# Patient Record
Sex: Male | Born: 1960
Health system: Southern US, Community
[De-identification: ages and names within clinical notes are randomized; demographics above are authoritative.]

## PROBLEM LIST (undated history)

## (undated) DIAGNOSIS — E785 Hyperlipidemia, unspecified: Secondary | ICD-10-CM

## (undated) DIAGNOSIS — C61 Malignant neoplasm of prostate: Secondary | ICD-10-CM

## (undated) DIAGNOSIS — I428 Other cardiomyopathies: Secondary | ICD-10-CM

## (undated) DIAGNOSIS — F0390 Unspecified dementia without behavioral disturbance: Secondary | ICD-10-CM

## (undated) DIAGNOSIS — I4891 Unspecified atrial fibrillation: Secondary | ICD-10-CM

## (undated) DIAGNOSIS — I219 Acute myocardial infarction, unspecified: Secondary | ICD-10-CM

## (undated) DIAGNOSIS — Z8249 Family history of ischemic heart disease and other diseases of the circulatory system: Secondary | ICD-10-CM

## (undated) DIAGNOSIS — R0602 Shortness of breath: Secondary | ICD-10-CM

## (undated) DIAGNOSIS — I447 Left bundle-branch block, unspecified: Secondary | ICD-10-CM

## (undated) DIAGNOSIS — N186 End stage renal disease: Secondary | ICD-10-CM

## (undated) DIAGNOSIS — G4733 Obstructive sleep apnea (adult) (pediatric): Secondary | ICD-10-CM

## (undated) DIAGNOSIS — M1990 Unspecified osteoarthritis, unspecified site: Secondary | ICD-10-CM

## (undated) DIAGNOSIS — I493 Ventricular premature depolarization: Secondary | ICD-10-CM

## (undated) DIAGNOSIS — F32A Depression, unspecified: Secondary | ICD-10-CM

## (undated) DIAGNOSIS — I509 Heart failure, unspecified: Secondary | ICD-10-CM

## (undated) DIAGNOSIS — E669 Obesity, unspecified: Secondary | ICD-10-CM

## (undated) DIAGNOSIS — I10 Essential (primary) hypertension: Secondary | ICD-10-CM

## (undated) DIAGNOSIS — I639 Cerebral infarction, unspecified: Secondary | ICD-10-CM

## (undated) DIAGNOSIS — N189 Chronic kidney disease, unspecified: Secondary | ICD-10-CM

## (undated) DIAGNOSIS — K219 Gastro-esophageal reflux disease without esophagitis: Secondary | ICD-10-CM

## (undated) DIAGNOSIS — I519 Heart disease, unspecified: Secondary | ICD-10-CM

## (undated) DIAGNOSIS — F419 Anxiety disorder, unspecified: Secondary | ICD-10-CM

## (undated) HISTORY — DX: Cerebral infarction, unspecified: I63.9

## (undated) HISTORY — DX: Family history of ischemic heart disease and other diseases of the circulatory system: Z82.49

## (undated) HISTORY — DX: Unspecified atrial fibrillation: I48.91

## (undated) HISTORY — PX: FOOT SURGERY: SHX648

## (undated) HISTORY — DX: Essential (primary) hypertension: I10

## (undated) HISTORY — PX: PROSTATE BIOPSY: SHX241

## (undated) HISTORY — DX: Heart failure, unspecified: I50.9

## (undated) HISTORY — DX: Morbid (severe) obesity due to excess calories: E66.01

## (undated) HISTORY — DX: Ventricular premature depolarization: I49.3

## (undated) HISTORY — DX: Shortness of breath: R06.02

## (undated) HISTORY — PX: PACEMAKER REMOVAL: SHX5066

## (undated) HISTORY — DX: Heart disease, unspecified: I51.9

## (undated) HISTORY — DX: Left bundle-branch block, unspecified: I44.7

## (undated) HISTORY — PX: LEFT VENTRICULAR ASSIST DEVICE: SHX2679

## (undated) HISTORY — PX: COLONOSCOPY W/ BIOPSIES AND POLYPECTOMY: SHX1376

## (undated) HISTORY — DX: Gastro-esophageal reflux disease without esophagitis: K21.9

## (undated) HISTORY — DX: Hyperlipidemia, unspecified: E78.5

## (undated) HISTORY — DX: Other cardiomyopathies: I42.8

## (undated) HISTORY — DX: Obstructive sleep apnea (adult) (pediatric): G47.33

---

## 1997-12-30 ENCOUNTER — Ambulatory Visit (HOSPITAL_COMMUNITY): Admission: RE | Admit: 1997-12-30 | Discharge: 1997-12-30 | Payer: Self-pay | Admitting: Cardiology

## 1999-05-15 ENCOUNTER — Inpatient Hospital Stay (HOSPITAL_COMMUNITY): Admission: EM | Admit: 1999-05-15 | Discharge: 1999-05-17 | Payer: Self-pay

## 1999-05-16 ENCOUNTER — Encounter: Payer: Self-pay | Admitting: Cardiology

## 1999-12-19 ENCOUNTER — Inpatient Hospital Stay (HOSPITAL_COMMUNITY): Admission: AD | Admit: 1999-12-19 | Discharge: 1999-12-21 | Payer: Self-pay | Admitting: Internal Medicine

## 1999-12-25 ENCOUNTER — Encounter: Admission: RE | Admit: 1999-12-25 | Discharge: 2000-03-24 | Payer: Self-pay | Admitting: Internal Medicine

## 2000-12-25 ENCOUNTER — Encounter: Admission: RE | Admit: 2000-12-25 | Discharge: 2001-03-25 | Payer: Self-pay | Admitting: Internal Medicine

## 2001-05-21 ENCOUNTER — Encounter: Admission: RE | Admit: 2001-05-21 | Discharge: 2001-08-19 | Payer: Self-pay | Admitting: Internal Medicine

## 2002-12-29 ENCOUNTER — Encounter: Admission: RE | Admit: 2002-12-29 | Discharge: 2003-03-29 | Payer: Self-pay | Admitting: Surgery

## 2002-12-29 ENCOUNTER — Encounter: Payer: Self-pay | Admitting: Surgery

## 2002-12-29 ENCOUNTER — Ambulatory Visit (HOSPITAL_COMMUNITY): Admission: RE | Admit: 2002-12-29 | Discharge: 2002-12-29 | Payer: Self-pay | Admitting: Surgery

## 2003-01-06 ENCOUNTER — Ambulatory Visit (HOSPITAL_COMMUNITY): Admission: RE | Admit: 2003-01-06 | Discharge: 2003-01-06 | Payer: Self-pay | Admitting: Surgery

## 2003-01-06 ENCOUNTER — Encounter: Payer: Self-pay | Admitting: Cardiology

## 2003-02-02 ENCOUNTER — Ambulatory Visit (HOSPITAL_BASED_OUTPATIENT_CLINIC_OR_DEPARTMENT_OTHER): Admission: RE | Admit: 2003-02-02 | Discharge: 2003-02-02 | Payer: Self-pay | Admitting: Surgery

## 2003-04-24 ENCOUNTER — Emergency Department (HOSPITAL_COMMUNITY): Admission: EM | Admit: 2003-04-24 | Discharge: 2003-04-25 | Payer: Self-pay | Admitting: Emergency Medicine

## 2003-07-15 ENCOUNTER — Emergency Department (HOSPITAL_COMMUNITY): Admission: EM | Admit: 2003-07-15 | Discharge: 2003-07-15 | Payer: Self-pay | Admitting: Emergency Medicine

## 2003-07-17 ENCOUNTER — Inpatient Hospital Stay (HOSPITAL_COMMUNITY): Admission: RE | Admit: 2003-07-17 | Discharge: 2003-07-18 | Payer: Self-pay | Admitting: Internal Medicine

## 2005-07-31 ENCOUNTER — Ambulatory Visit (HOSPITAL_BASED_OUTPATIENT_CLINIC_OR_DEPARTMENT_OTHER): Admission: RE | Admit: 2005-07-31 | Discharge: 2005-07-31 | Payer: Self-pay | Admitting: Cardiology

## 2005-08-05 ENCOUNTER — Ambulatory Visit: Payer: Self-pay | Admitting: Internal Medicine

## 2005-09-13 ENCOUNTER — Ambulatory Visit: Payer: Self-pay | Admitting: Internal Medicine

## 2005-11-05 ENCOUNTER — Emergency Department (HOSPITAL_COMMUNITY): Admission: EM | Admit: 2005-11-05 | Discharge: 2005-11-06 | Payer: Self-pay | Admitting: Emergency Medicine

## 2006-10-09 ENCOUNTER — Ambulatory Visit (HOSPITAL_COMMUNITY): Admission: RE | Admit: 2006-10-09 | Discharge: 2006-10-09 | Payer: Self-pay | Admitting: Surgery

## 2006-10-14 ENCOUNTER — Encounter: Admission: RE | Admit: 2006-10-14 | Discharge: 2007-01-12 | Payer: Self-pay | Admitting: Surgery

## 2006-10-17 ENCOUNTER — Ambulatory Visit: Payer: Self-pay | Admitting: Internal Medicine

## 2006-10-23 ENCOUNTER — Ambulatory Visit (HOSPITAL_COMMUNITY): Admission: RE | Admit: 2006-10-23 | Discharge: 2006-10-23 | Payer: Self-pay | Admitting: Surgery

## 2006-12-25 ENCOUNTER — Ambulatory Visit (HOSPITAL_COMMUNITY): Admission: RE | Admit: 2006-12-25 | Discharge: 2006-12-25 | Payer: Self-pay | Admitting: Surgery

## 2007-01-13 ENCOUNTER — Encounter: Admission: RE | Admit: 2007-01-13 | Discharge: 2007-04-13 | Payer: Self-pay | Admitting: Surgery

## 2007-01-27 ENCOUNTER — Ambulatory Visit (HOSPITAL_COMMUNITY): Admission: RE | Admit: 2007-01-27 | Discharge: 2007-01-28 | Payer: Self-pay | Admitting: Surgery

## 2007-01-27 HISTORY — PX: LAPAROSCOPIC GASTRIC BANDING: SHX1100

## 2007-10-17 DIAGNOSIS — E1142 Type 2 diabetes mellitus with diabetic polyneuropathy: Secondary | ICD-10-CM | POA: Insufficient documentation

## 2007-10-17 DIAGNOSIS — E1169 Type 2 diabetes mellitus with other specified complication: Secondary | ICD-10-CM | POA: Insufficient documentation

## 2007-10-17 DIAGNOSIS — I428 Other cardiomyopathies: Secondary | ICD-10-CM

## 2007-10-17 DIAGNOSIS — E669 Obesity, unspecified: Secondary | ICD-10-CM

## 2007-10-17 DIAGNOSIS — G4733 Obstructive sleep apnea (adult) (pediatric): Secondary | ICD-10-CM

## 2007-10-21 ENCOUNTER — Encounter: Payer: Self-pay | Admitting: Internal Medicine

## 2008-08-27 DIAGNOSIS — I639 Cerebral infarction, unspecified: Secondary | ICD-10-CM

## 2008-08-27 HISTORY — DX: Cerebral infarction, unspecified: I63.9

## 2008-09-06 ENCOUNTER — Ambulatory Visit: Payer: Self-pay | Admitting: *Deleted

## 2008-09-06 ENCOUNTER — Inpatient Hospital Stay (HOSPITAL_COMMUNITY): Admission: EM | Admit: 2008-09-06 | Discharge: 2008-09-08 | Payer: Self-pay | Admitting: Emergency Medicine

## 2008-09-06 ENCOUNTER — Encounter (INDEPENDENT_AMBULATORY_CARE_PROVIDER_SITE_OTHER): Payer: Self-pay | Admitting: *Deleted

## 2008-09-07 ENCOUNTER — Encounter (INDEPENDENT_AMBULATORY_CARE_PROVIDER_SITE_OTHER): Payer: Self-pay | Admitting: Neurology

## 2009-08-03 ENCOUNTER — Encounter: Payer: Self-pay | Admitting: Internal Medicine

## 2009-08-17 ENCOUNTER — Encounter: Payer: Self-pay | Admitting: Internal Medicine

## 2009-08-30 DIAGNOSIS — I4891 Unspecified atrial fibrillation: Secondary | ICD-10-CM | POA: Insufficient documentation

## 2009-08-30 DIAGNOSIS — I2589 Other forms of chronic ischemic heart disease: Secondary | ICD-10-CM | POA: Insufficient documentation

## 2009-08-30 DIAGNOSIS — E785 Hyperlipidemia, unspecified: Secondary | ICD-10-CM

## 2009-08-30 DIAGNOSIS — I10 Essential (primary) hypertension: Secondary | ICD-10-CM | POA: Insufficient documentation

## 2009-08-30 DIAGNOSIS — I4949 Other premature depolarization: Secondary | ICD-10-CM

## 2009-08-30 DIAGNOSIS — Z8679 Personal history of other diseases of the circulatory system: Secondary | ICD-10-CM

## 2009-08-30 DIAGNOSIS — Z8673 Personal history of transient ischemic attack (TIA), and cerebral infarction without residual deficits: Secondary | ICD-10-CM | POA: Insufficient documentation

## 2009-08-31 ENCOUNTER — Telehealth (INDEPENDENT_AMBULATORY_CARE_PROVIDER_SITE_OTHER): Payer: Self-pay | Admitting: *Deleted

## 2009-08-31 ENCOUNTER — Ambulatory Visit: Payer: Self-pay | Admitting: Internal Medicine

## 2009-09-01 ENCOUNTER — Telehealth: Payer: Self-pay | Admitting: Internal Medicine

## 2009-09-01 LAB — CONVERTED CEMR LAB
Basophils Relative: 1.2 % (ref 0.0–3.0)
Calcium: 8.7 mg/dL (ref 8.4–10.5)
Chloride: 103 meq/L (ref 96–112)
Creatinine, Ser: 1 mg/dL (ref 0.4–1.5)
Eosinophils Absolute: 0.3 10*3/uL (ref 0.0–0.7)
Eosinophils Relative: 6 % — ABNORMAL HIGH (ref 0.0–5.0)
HCT: 44.7 % (ref 39.0–52.0)
Hemoglobin: 14.6 g/dL (ref 13.0–17.0)
INR: 3.2 — ABNORMAL HIGH (ref 0.8–1.0)
Lymphs Abs: 1.1 10*3/uL (ref 0.7–4.0)
Neutro Abs: 2.5 10*3/uL (ref 1.4–7.7)
Platelets: 162 10*3/uL (ref 150.0–400.0)
WBC: 4.3 10*3/uL — ABNORMAL LOW (ref 4.5–10.5)

## 2009-09-05 ENCOUNTER — Ambulatory Visit: Payer: Self-pay | Admitting: Internal Medicine

## 2009-09-05 LAB — CONVERTED CEMR LAB: Prothrombin Time: 28.2 s — ABNORMAL HIGH (ref 9.1–11.7)

## 2009-09-06 ENCOUNTER — Ambulatory Visit: Payer: Self-pay | Admitting: Internal Medicine

## 2009-09-06 ENCOUNTER — Inpatient Hospital Stay (HOSPITAL_COMMUNITY): Admission: RE | Admit: 2009-09-06 | Discharge: 2009-09-07 | Payer: Self-pay | Admitting: Internal Medicine

## 2009-09-07 ENCOUNTER — Encounter: Payer: Self-pay | Admitting: Internal Medicine

## 2009-09-19 ENCOUNTER — Ambulatory Visit: Payer: Self-pay

## 2009-09-19 ENCOUNTER — Encounter: Payer: Self-pay | Admitting: Internal Medicine

## 2009-09-21 HISTORY — PX: CARDIAC PACEMAKER PLACEMENT: SHX583

## 2009-10-12 ENCOUNTER — Ambulatory Visit: Payer: Self-pay | Admitting: Internal Medicine

## 2009-10-24 ENCOUNTER — Telehealth (INDEPENDENT_AMBULATORY_CARE_PROVIDER_SITE_OTHER): Payer: Self-pay | Admitting: *Deleted

## 2009-11-07 ENCOUNTER — Emergency Department (HOSPITAL_COMMUNITY): Admission: EM | Admit: 2009-11-07 | Discharge: 2009-11-07 | Payer: Self-pay | Admitting: Emergency Medicine

## 2009-11-09 ENCOUNTER — Telehealth (INDEPENDENT_AMBULATORY_CARE_PROVIDER_SITE_OTHER): Payer: Self-pay | Admitting: *Deleted

## 2009-12-21 ENCOUNTER — Ambulatory Visit: Payer: Self-pay | Admitting: Internal Medicine

## 2009-12-21 DIAGNOSIS — I472 Ventricular tachycardia, unspecified: Secondary | ICD-10-CM | POA: Insufficient documentation

## 2010-03-17 ENCOUNTER — Ambulatory Visit: Payer: Self-pay | Admitting: Internal Medicine

## 2010-03-29 ENCOUNTER — Ambulatory Visit: Payer: Self-pay | Admitting: Cardiology

## 2010-04-05 ENCOUNTER — Ambulatory Visit: Payer: Self-pay | Admitting: Cardiology

## 2010-04-12 ENCOUNTER — Ambulatory Visit: Payer: Self-pay | Admitting: Cardiology

## 2010-04-19 ENCOUNTER — Ambulatory Visit: Payer: Self-pay | Admitting: Cardiology

## 2010-04-26 ENCOUNTER — Ambulatory Visit: Payer: Self-pay | Admitting: Cardiology

## 2010-05-03 ENCOUNTER — Ambulatory Visit: Payer: Self-pay | Admitting: Cardiology

## 2010-05-11 ENCOUNTER — Ambulatory Visit: Payer: Self-pay | Admitting: Cardiology

## 2010-06-01 ENCOUNTER — Ambulatory Visit: Payer: Self-pay | Admitting: Cardiology

## 2010-06-15 ENCOUNTER — Ambulatory Visit: Payer: Self-pay | Admitting: Internal Medicine

## 2010-06-15 ENCOUNTER — Ambulatory Visit: Payer: Self-pay | Admitting: Cardiology

## 2010-06-21 ENCOUNTER — Ambulatory Visit: Payer: Self-pay | Admitting: Cardiology

## 2010-06-28 ENCOUNTER — Ambulatory Visit: Payer: Self-pay | Admitting: Cardiology

## 2010-06-30 ENCOUNTER — Encounter (INDEPENDENT_AMBULATORY_CARE_PROVIDER_SITE_OTHER): Payer: Self-pay | Admitting: *Deleted

## 2010-07-05 ENCOUNTER — Ambulatory Visit: Payer: Self-pay | Admitting: Cardiology

## 2010-08-02 ENCOUNTER — Ambulatory Visit: Payer: Self-pay | Admitting: Cardiology

## 2010-09-26 NOTE — Letter (Signed)
Summary: Implantable Device Instructions  Press photographer, [place]  [id number] [place]   [place], [place] [address]   Phone: [phone]  Fax: [phone]      Implantable Device Instructions  You are scheduled for:    a BI-V ICD  on 09/06/09 with Dr. Rayann Heman  1.  Please arrive at the [facility] at 5:45amon the day of your procedure.  2.  Do not eat or drink after midnight the night before your procedure.  3.  Complete lab work on 08/31/09.   4.  Do NOT take these medications for the day of your procedure:  Furosemide  Take your last dose of Coumadin on--I will call you if you need to hold  5.  Plan for an overnight stay.  Bring your insurance cards and a list of your medications.  6.  Wash your chest and neck with antibacterial soap (any brand) the evening before and the morning of your procedure.  Rinse well.  7.  Education material received:     Bi-V ICD  *If you have ANY questions after you get home, please call the office [phone].  Leonia Reader  *Every attempt is made to prevent procedures from being rescheduled.  Due to the nauture of Electrophysiology, rescheduling can happen.  The physician is always aware and directs the staff when this occurs.

## 2010-09-26 NOTE — Cardiovascular Report (Signed)
Summary: Cardiac Cath Pre-Orders  Cardiac Cath Pre-Orders   Imported By: Jamelle Haring 09/29/2009 08:17:09  _____________________________________________________________________  External Attachment:    Type:   Image     Comment:   External Document

## 2010-09-26 NOTE — Progress Notes (Signed)
  Patient left FMLA papers, Forwarded to [facility] for Processing. [facility] Mesiemore  August 31, 2009 4:18 PM

## 2010-09-26 NOTE — Letter (Signed)
Summary: Remote Device Check  [place], [place]  [id number] [place]   [place], [place] [address]   Phone: [phone]  Fax: [phone]     June 30, 2010 MRN: [id number]   [name] [address] [place],   [address]   Dear [name],   Your remote transmission was recieved and reviewed by your physician.  All diagnostics were within normal limits for you.  _____Your next transmission is scheduled for:                       .  Please transmit at any time this day.  If you have a wireless device your transmission will be sent automatically.  __X____Your next office visit is scheduled for: January 2012 with Dr. Rayann Heman.                                 Sincerely,  Alma Friendly, LPN

## 2010-09-26 NOTE — Letter (Signed)
Summary: [facility] Medical Records  [facility] Medical [facility]   Imported By: Jamelle Haring 09/29/2009 08:49:02  _____________________________________________________________________  External Attachment:    Type:   Image     Comment:   External Document

## 2010-09-26 NOTE — Procedures (Signed)
Summary: wound check/jml   Current Medications (verified): 1)  Potassium Chloride Cr 10 Meq Cr-Caps (Potassium Chloride) .... Take One Tablet By Mouth Daily 2)  Furosemide 80 Mg Tabs (Furosemide) .... Take One-Two Tablets By Mouth Daily. As Needed 3)  Carvedilol 3.125 Mg Tabs (Carvedilol) .... Take One Tablet By Mouth Once Daily 4)  Warfarin Sodium 5 Mg Tabs (Warfarin Sodium) .... Use As Directed By Anticoagulation Clinic 5)  Ramipril 5 Mg Caps (Ramipril) .... Take One Capsule By Mouth Daily  Allergies (verified): No Known Drug Allergies   ICD Specifications Following MD:  Thompson Grayer, MD     Referring MD:  [place] ICD Vendor:  Medtronic     ICD Model Number:  [id number]     ICD Serial Number:  [id number] ICD DOI:  09/06/2009     ICD Implanting MD:  Thompson Grayer, MD  Lead 1:    Location: RA     DOI: 09/06/2009     Model #: [id number]     Serial #: [id number]     Status: active Lead 2:    Location: RV     DOI: 09/06/2009     Model #: [id number]     Serial #: [id number]     Status: active Lead 3:    Location: LV     DOI: 09/06/2009     Model #: [id number]     Serial #: [id number]     Status: active  Indications::  DCM, CHF   ICD Follow Up Remote Check?  No Battery Voltage:  3.21 V     Charge Time:  7.9 seconds     Underlying rhythm:  SB WITH FREQUENT PVC'S ICD Dependent:  No       ICD Device Measurements Atrium:  Amplitude: 5.8 mV, Impedance: 380 ohms, Threshold: 0.75 V at 0.4 msec Right Ventricle:  Amplitude: 11.1 mV, Impedance: 475 ohms, Threshold: 0.5 V at 0.4 msec Left Ventricle:  Impedance: 380 ohms, Threshold: 0.75 V at 0.4 msec Configuration: LV RING TO RV COIL Shock Impedance: 39/49 ohms   Episodes MS Episodes:  0     Shock:  0     ATP:  1     Nonsustained:  2     Atrial Pacing:  2.8%     Ventricular Pacing:  83.7%  Brady Parameters Mode DDD     Lower Rate Limit:  80     Upper Rate Limit 130 PAV 130     Sensed AV Delay:  100  Tachy Zones VF:  207     Next Cardiology Appt Due:   10/10/2009 Tech Comments:  Wound check appt.  Steri-strips removed.  Wound without redness or edema.  Pt with frequent ventricular ectopy and runs of an idioventricular rhythm that results in decreased CRT pacing.  Patient had only been taking Coreg 3.125mg  at bedtime, per Dr Rayann Heman, increase to 6.25mg  two times a day.  Pt will do this gradually over the next week and monitor for recurrence of orthostatic intolerance or near syncope.  Pt had 1 episode of VT that was terminated with ATP during charging in the VF zone.  Will address need for a VT zone at next office visit.  Pt also had 2 episodes of NSVT.  Pt unaware of these episodes. Base rate increased to 80bpm today per Dr Rayann Heman to try and suppress ectopy.  ROV 3 weeks Dr Rayann Heman.  Rodena Piety with Dr Martinique notified of above.  Pt has an appt with Dr Martinique in the  next week per his report. Chanetta Marshall RN BSN  September 19, 2009 3:53 PM

## 2010-09-26 NOTE — Letter (Signed)
Summary: [facility]   Imported By: Jamelle Haring 08/31/2009 09:04:12  _____________________________________________________________________  External Attachment:    Type:   Image     Comment:   External Document

## 2010-09-26 NOTE — Progress Notes (Signed)
Summary: Records request from [facility]  Request for records received from [facility]. Request forwarded to [employer]. Dena Chavis  November 09, 2009 8:20 AM

## 2010-09-26 NOTE — Cardiovascular Report (Signed)
Summary: Office Visit Remote   Office Visit Remote   Imported By: Sallee Provencal 07/04/2010 15:32:22  _____________________________________________________________________  External Attachment:    Type:   Image     Comment:   External Document

## 2010-09-26 NOTE — Cardiovascular Report (Signed)
Summary: Office Visit  Office Visit   Imported By: Marilynne Drivers 02/28/2010 13:22:25  _____________________________________________________________________  External Attachment:    Type:   Image     Comment:   External Document

## 2010-09-26 NOTE — Assessment & Plan Note (Signed)
Summary: pc2/jml   Visit Type:  Follow-up Referring Provider:  Peter Martinique, MD Primary Provider:  Rolm Bookbinder, MD   History of Present Illness: The patient presents today for electrophysiology followup. Since last being seen in our office, he received ICD shocks for VT/VF on 11/07/09.  He then went to [facility] and received an LVAD.  He was also placed on Amiodarone.  He has done very well since that time.  He reports that his energy has significantly improved.  The patient denies symptoms of chest pain, orthopnea, PND, lower extremity edema, dizziness, presyncope, syncope, or neurologic sequela. The patient is tolerating medications without difficulties and is otherwise without complaint today.   Current Medications (verified): 1)  Carvedilol 3.125 Mg Tabs (Carvedilol) .Marland Kitchen.. 1 Tab Two Times A Day 2)  Warfarin Sodium 5 Mg Tabs (Warfarin Sodium) .... Use As Directed By Anticoagulation Clinic 3)  Lisinopril 2.5 Mg Tabs (Lisinopril) .... Take One Tablet By Mouth Daily 4)  Amiodarone Hcl 200 Mg Tabs (Amiodarone Hcl) .... Take One Tablet By Mouth Daily 5)  Prilosec 20 Mg Cpdr (Omeprazole) .... Once Daily  Allergies (verified): No Known Drug Allergies  Past History:  Past Medical History: ATRIAL FIBRILLATION (ICD-427.31) Nonischemic CM (EF 15-20%) s/p LVAD implant 3/11 NYHA Class III CHF COUMADIN THERAPY (ICD-V58.61) PVCs HYPERTENSION, UNSPECIFIED (ICD-401.9) HYPERLIPIDEMIA (ICD-272.4) MORBID OBESITY s/p lab band procedure CEREBROVASCULAR ACCIDENT 1/10 ( Right cortical subcortical acute brain infarct) DIABETES MELLITUS, TYPE II (ICD-250.00) OBSTRUCTIVE SLEEP APNEA (ICD-327.23) Ventricular Tachycardia 11/07/09, now on Amiodarone  Past Surgical History:  laparoscopic gastric banding in June 2008.   recent hammer toe surgery  LVAD 3/11  Social History: Reviewed history from 08/31/2009 and no changes required. Pt lives in [place] with spouse.  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  He denies tobacco.  Remote h/o mild ETOH, none recently.  He is married and has children.      Review of Systems       All systems are reviewed and negative except as listed in the HPI.   Vital Signs:  Patient profile:   50 year old male Height:      78 inches Weight:      248 pounds BMI:     28.76 Pulse rate:   136 / minute  Vitals Entered By: Margaretmary Bayley CMA (December 21, 2009 2:28 PM)  Physical Exam  General:  tall, obese male, NAD,  chronically ill Head:  normocephalic and atraumatic Eyes:  PERRLA/EOM intact; conjunctiva and lids normal. Mouth:  Teeth, gums and palate normal. Oral mucosa normal. Neck:  supple, JVP 11cm Chest Wall:  ICD pocket is well healed Lungs:  Clear bilaterally to auscultation and percussion. Heart:  RRR with frequent ectopy, laterally displaced PMI 1/6 SEM at the apex. Abdomen:  Bowel sounds positive; abdomen soft and non-tender without masses, organomegaly, or hernias noted. No hepatosplenomegaly. Msk:  Back normal, normal gait. Muscle strength and tone normal. Pulses:  pulses normal in all 4 extremities Extremities:  No clubbing or cyanosis.  no edema Neurologic:  Alert and oriented x 3. strength/sensation are intact Skin:  Intact without lesions or rashes. Psych:  Normal affect.    ICD Specifications Following MD:  Thompson Grayer, MD     Referring MD:  [place] ICD Vendor:  [employer]tronic     ICD Model Number:  [id number]     ICD Serial Number:  [id number] ICD DOI:  09/06/2009     ICD Implanting MD:  Thompson Grayer, MD  Lead 1:  Location: RA     DOI: 09/06/2009     Model #: [id number]     Serial #: [id number]     Status: active Lead 2:    Location: RV     DOI: 09/06/2009     Model #: [id number]     Serial #: [id number]     Status: active Lead 3:    Location: LV     DOI: 09/06/2009     Model #: [id number]     Serial #: [id number]     Status: active  Indications::  DCM, CHF   ICD Follow Up ICD Dependent:  No       ICD Device Measurements Configuration: LV  RING TO RV COIL  Brady Parameters Mode DDDR     Lower Rate Limit:  80     Upper Rate Limit 130 PAV 130     Sensed AV Delay:  100  Tachy Zones VF:  51     MD Comments:  No further VT/VF since 11/07/09.  Impression & Recommendations:  Problem # 1:  ISCHEMIC CARDIOMYOPATHY (ICD-414.8) improved with LVAD no changes today  Problem # 2:  VENTRICULAR TACHYCARDIA (ICD-427.1) improved with amiodarone no changes today  Problem # 3:  ATRIAL FIBRILLATION (ICD-427.31) stable  Patient Instructions: 1)  Your physician recommends that you schedule a follow-up appointment in: 3 months with Dr Rayann Heman

## 2010-09-26 NOTE — Progress Notes (Signed)
Summary: checking on status of fax  Phone Note Call from Patient Call back at Home Phone [phone] Call back at Work Phone [phone]   Caller: Patient Reason for Call: Talk to Nurse Details for Reason: pt having surgery on tuesday,checking on  status of faxed .  Initial call taken by: Neil Crouch,  September 01, 2009 12:55 PM  Follow-up for Phone Call        info sent to [facility] will call when ready to pick up Janan Halter, RN, BSN  September 01, 2009 2:09 PM

## 2010-09-26 NOTE — Progress Notes (Signed)
  Pt signed and left ROI, forwarded to [facility] for processing. [name]  October 24, 2009 10:47 AM

## 2010-09-26 NOTE — Miscellaneous (Signed)
Summary: Device preload  Clinical Lists Changes  Observations: Added new observation of ICD INDICATN: CM (09/07/2009 11:38) Added new observation of ICDLEADSTAT3: active (09/07/2009 11:38) Added new observation of ICDLEADSER3: [id number] (09/07/2009 11:38) Added new observation of ICDLEADMOD3: [id number]  (09/07/2009 11:38) Added new observation of ICDLEADLOC3: LV  (09/07/2009 11:38) Added new observation of ICDLEADSTAT2: active  (09/07/2009 11:38) Added new observation of ICDLEADSER2: [id number]  (09/07/2009 11:38) Added new observation of ICDLEADMOD2: [id number]  (09/07/2009 11:38) Added new observation of ICDLEADLOC2: RV  (09/07/2009 11:38) Added new observation of ICDLEADSTAT1: active  (09/07/2009 11:38) Added new observation of ICDLEADSER1: [id number]  (09/07/2009 11:38) Added new observation of ICDLEADMOD1: 5076  (09/07/2009 11:38) Added new observation of ICDLEADLOC1: RA  (09/07/2009 11:38) Added new observation of ICD IMP MD: Thompson Grayer, MD  (09/07/2009 11:38) Added new observation of ICDLEADDOI3: 09/06/2009  (09/07/2009 11:38) Added new observation of ICDLEADDOI2: 09/06/2009  (09/07/2009 11:38) Added new observation of ICDLEADDOI1: 09/06/2009  (09/07/2009 11:38) Added new observation of ICD IMPL DTE: 09/06/2009  (09/07/2009 11:38) Added new observation of ICD SERL#: [id number] H  (09/07/2009 11:38) Added new observation of ICD MODL#: [id number]  (09/07/2009 11:38) Added new observation of ICDMANUFACTR: Medtronic  (09/07/2009 11:38) Added new observation of ICD MD: Thompson Grayer, MD  (09/07/2009 11:38)       ICD Specifications Following MD:  Thompson Grayer, MD     ICD Vendor:  Medtronic     ICD Model Number:  [id number]     ICD Serial Number:  [id number] ICD DOI:  09/06/2009     ICD Implanting MD:  Thompson Grayer, MD  Lead 1:    Location: RA     DOI: 09/06/2009     Model #: [id number]     Serial #: [id number]     Status: active Lead 2:    Location: RV     DOI: 09/06/2009     Model #: [id number]     Serial #:  [id number]     Status: active Lead 3:    Location: LV     DOI: 09/06/2009     Model #: [id number]     Serial #: [id number]     Status: active  Indications::  CM

## 2010-09-26 NOTE — Assessment & Plan Note (Signed)
Summary: 3wk f/u sl   Visit Type:  3 wk f/u Referring Provider:  Peter Martinique, MD Primary Provider:  Rolm Bookbinder, MD  CC:  fatigue.  History of Present Illness: The patient presents today for routine electrophysiology followup. He reports doing about the samel since last being seen in our clinic.  He reports fatigue and daytime somnolence. The patient denies symptoms of palpitations, chest pain,  lower extremity edema, dizziness, presyncope, syncope, or neurologic sequela.  His SOB is stable.  The patient is tolerating medications without difficulties and is otherwise without complaint today.   Current Medications (verified): 1)  Potassium Chloride Cr 10 Meq Cr-Caps (Potassium Chloride) .... Take One Tablet By Mouth Daily 2)  Furosemide 80 Mg Tabs (Furosemide) .... Take One-Two Tablets By Mouth Daily. As Needed 3)  Carvedilol 3.125 Mg Tabs (Carvedilol) .Marland Kitchen.. 1 Tab Two Times A Day 4)  Warfarin Sodium 5 Mg Tabs (Warfarin Sodium) .... Use As Directed By Anticoagulation Clinic 5)  Ramipril 5 Mg Caps (Ramipril) .... Take One Capsule By Mouth Daily  Allergies (verified): No Known Drug Allergies  Past History:  Past Medical History: Reviewed history from 08/31/2009 and no changes required. ATRIAL FIBRILLATION (ICD-427.31) Nonischemic CM (EF 15-20%) NYHA Class III CHF COUMADIN THERAPY (ICD-V58.61) PVCs HYPERTENSION, UNSPECIFIED (ICD-401.9) HYPERLIPIDEMIA (ICD-272.4) MORBID OBESITY s/p lab band procedure CEREBROVASCULAR ACCIDENT 1/10 ( Right cortical subcortical acute brain infarct) DIABETES MELLITUS, TYPE II (ICD-250.00) OBSTRUCTIVE SLEEP APNEA (ICD-327.23)  Past Surgical History: Reviewed history from 08/31/2009 and no changes required.  laparoscopic gastric banding in June 2008.   recent hammer toe surgery  Social History: Reviewed history from 08/31/2009 and no changes required. Pt lives in [place] with spouse.  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  Remote h/o mild ETOH, none recently.  He is married and has children.      Review of Systems       All systems are reviewed and negative except as listed in the HPI.   Vital Signs:  Patient profile:   50 year old male Height:      78 inches Weight:      246 pounds BMI:     28.53 Pulse rate:   72 / minute Pulse rhythm:   regular BP sitting:   82 / 60  (left arm) Cuff size:   large  Vitals Entered By: Julaine Hua, CMA (October 12, 2009 8:58 AM)  Physical Exam  General:  tall, obese male, NAD,  chronically ill Head:  normocephalic and atraumatic Eyes:  PERRLA/EOM intact; conjunctiva and lids normal. Nose:  no deformity, discharge, inflammation, or lesions Mouth:  Teeth, gums and palate normal. Oral mucosa normal. Neck:  supple, JVP 11cm Chest Wall:  ICD pocket is well healed Lungs:  Clear bilaterally to auscultation and percussion. Heart:  RRR with frequent ectopy, laterally displaced PMI 1/6 SEM at the apex. Abdomen:  Bowel sounds positive; abdomen soft and non-tender without masses, organomegaly, or hernias noted. No hepatosplenomegaly. Msk:  Back normal, normal gait. Muscle strength and tone normal. Pulses:  pulses normal in all 4 extremities Extremities:  No clubbing or cyanosis.  no edema Neurologic:  Alert and oriented x 3. strength/sensation are intact Skin:  Intact without lesions or rashes. Cervical Nodes:  no significant adenopathy Psych:  Normal affect.    ICD Specifications Following MD:  Thompson Grayer, MD     Referring MD:  [place] ICD Vendor:  Medtronic     ICD Model Number:  [phone]  ICD Serial Number:  [id number] ICD DOI:  09/06/2009     ICD Implanting MD:  Thompson Grayer, MD  Lead 1:    Location: RA     DOI: 09/06/2009     Model #: [id number]     Serial #: [id number]     Status: active Lead 2:    Location: RV     DOI: 09/06/2009     Model #: [id number]     Serial #: [id number]     Status: active Lead 3:    Location: LV     DOI: 09/06/2009     Model #: [id number]      Serial #: [id number]     Status: active  Indications::  DCM, CHF   ICD Follow Up Remote Check?  No Battery Voltage:  3.20 V     Charge Time:  7.9 seconds     Underlying rhythm:  SR WITH FREQUENT PVC'S ICD Dependent:  No       ICD Device Measurements Atrium:  Amplitude: 4.9 mV, Impedance: 475 ohms, Threshold: 0.75 V at 0.4 msec Right Ventricle:  Amplitude: 12.5 mV, Impedance: 532 ohms, Threshold: 0.5 V at 0.4 msec Left Ventricle:  Impedance: 380 ohms, Threshold: 0.5 V at 0.4 msec Configuration: LV RING TO RV COIL Shock Impedance: 44/52 ohms   Episodes MS Episodes:  0     Shock:  0     ATP:  0     Nonsustained:  0     Atrial Pacing:  81.4%     Ventricular Pacing:  96.4%  Brady Parameters Mode DDDR     Lower Rate Limit:  80     Upper Rate Limit 130 PAV 130     Sensed AV Delay:  100  Tachy Zones VF:  207     Next Cardiology Appt Due:  12/26/2009 Tech Comments:  Normal device function.  Outputs decreased today to chronic levels.  PVC burden decreased from 239/hr to 63/hr since base rate increased to 80.  Rate response also turned on today.  ROV 3 months Dr Rayann Heman. Chanetta Marshall RN BSN  October 12, 2009 9:23 AM  MD Comments:  PVC burden has dramatically improved with changes previously made.  We have cut on rate response today.  Impression & Recommendations:  Problem # 1:  ISCHEMIC CARDIOMYOPATHY (ICD-414.8) The patient has stable NYHA Class III CHF.  He has not had significant improvment with resynchronization. Previously, he had a very high PVC burden, however with increasing his lower pacing rate, we have significantly overdriven his PVCs.  He is presently 94% BiV paced. We will continue medical management at this time. Dr Martinique is considering transplantation evaluation. The patient is also considering disability.  His updated medication list for this problem includes:    Furosemide 80 Mg Tabs (Furosemide) .Marland Kitchen... Take one-two tablets by mouth daily. as needed    Carvedilol  3.125 Mg Tabs (Carvedilol) .Marland Kitchen... 1 tab two times a day    Warfarin Sodium 5 Mg Tabs (Warfarin sodium) ..... Use as directed by anticoagulation clinic    Ramipril 5 Mg Caps (Ramipril) .Marland Kitchen... Take one capsule by mouth daily  Problem # 2:  VENTRICULAR ECTOPY (ICD-427.69) Much improved since last visit. I will therefore not proceed with antiarrhythmics or ablation at this time, as this is less likely to improve his overall condition. If ventricular ectopy increases, we will consider addition of amiodarone.  His updated medication list for this problem includes:    Carvedilol 3.125 Mg Tabs (  Carvedilol) .Marland Kitchen... 1 tab two times a day    Warfarin Sodium 5 Mg Tabs (Warfarin sodium) ..... Use as directed by anticoagulation clinic    Ramipril 5 Mg Caps (Ramipril) .Marland Kitchen... Take one capsule by mouth daily  Problem # 3:  HYPERTENSION, UNSPECIFIED (ICD-401.9) typically hypotensive.  He is reluctant to change medicines today.  He may receive symptomatic improvement if his BP was higher. Could consider decreasing lasix or stopping coreg, though I am reluctant to do this with his decreased EF.  His updated medication list for this problem includes:    Furosemide 80 Mg Tabs (Furosemide) .Marland Kitchen... Take one-two tablets by mouth daily. as needed    Carvedilol 3.125 Mg Tabs (Carvedilol) .Marland Kitchen... 1 tab two times a day    Ramipril 5 Mg Caps (Ramipril) .Marland Kitchen... Take one capsule by mouth daily  Patient Instructions: 1)  Your physician recommends that you schedule a follow-up appointment in: 3 months with Dr Rayann Heman.  2)  Your physician recommends that you continue on your current medications as directed. Please refer to the Current Medication list given to you today.

## 2010-09-26 NOTE — Assessment & Plan Note (Signed)
Summary: NEP/ICD BYV   Visit Type:  Initial Consult Referring Provider:  Peter Martinique, MD Primary Provider:  Rolm Bookbinder, MD  CC:  CHF.  History of Present Illness: [name] is a pleasant 50 yo AAM with a h/o nonischemic CM (EF 15-20%), NYHA Class III CHF, LBBB, and afib s/p prior stroke who presents for ep consultation regarding risk stratification for SCD.  He reports having an "enlarged heart" for years (since age 31).  He is suspected to possibly have a viral cardiomyopathy.  He also has a h/o morbid obesity and is s/p lap band procedure (484lbs->243lbs).   He reports symptoms of fatigue and dypsnea with mild to moderate activity.  He thinks that he could walk 1-2 blocks, but would then be tired.  His wife feels that he has been very sedentary due to fatigue and dyspnea over the past month or so.  He denies chest pain.  He requires 4-5 pilllows due to orthopnea and has frequent PND.  He also reports BLE edema.  He has been treated agressively with medical therapy by Dr Martinique, though this is further limited by hypotension.  He reports occasional dizziness and presyncope but denies frank syncope.  He reports frequent palpitations. He has a h/o frequent PVCs as well as atrial fibrillation.  He had a stroke in January but feels that he has no deficits.  He has been placed on coumadin and has done well since that time.  He is otherwise without complaint today.  Current Medications (verified): 1)  Potassium Chloride Cr 10 Meq Cr-Caps (Potassium Chloride) .... Take One Tablet By Mouth Daily 2)  Furosemide 80 Mg Tabs (Furosemide) .... Take One-Two Tablets By Mouth Daily. As Needed 3)  Carvedilol 3.125 Mg Tabs (Carvedilol) .... Take One Tablet By Mouth Once Daily 4)  Warfarin Sodium 5 Mg Tabs (Warfarin Sodium) .... Use As Directed By Anticoagulation Clinic 5)  Ramipril 5 Mg Caps (Ramipril) .... Take One Capsule By Mouth Daily  Allergies (verified): No Known Drug Allergies  Past History:  Past  Medical History: ATRIAL FIBRILLATION (ICD-427.31) Nonischemic CM (EF 15-20%) NYHA Class III CHF COUMADIN THERAPY (ICD-V58.61) PVCs HYPERTENSION, UNSPECIFIED (ICD-401.9) HYPERLIPIDEMIA (ICD-272.4) MORBID OBESITY s/p lab band procedure CEREBROVASCULAR ACCIDENT 1/10 ( Right cortical subcortical acute brain infarct) DIABETES MELLITUS, TYPE II (ICD-250.00) OBSTRUCTIVE SLEEP APNEA (ICD-327.23)  Past Surgical History:  laparoscopic gastric banding in June 2008.   recent hammer toe surgery  Family History:  His father had history of bypass surgery and  angioplasty.  Mother died of at age 56 with myocardial infarction  and lupus.  He has four siblings who are in good health.  No other family h/o cardiomyopathy , arrhythmias or sudden death     Social History: Pt lives in [place] with spouse.  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  He denies tobacco.  Remote h/o mild ETOH, none recently.  He is married and has children.      Review of Systems       All systems are reviewed and negative except as listed in the HPI.  He has frequent chronic vomiting.  Vital Signs:  Patient profile:   50 year old male Height:      78 inches Weight:      246 pounds BMI:     28.53 Pulse rate:   70 / minute BP sitting:   86 / 66  (left arm)  Vitals Entered By: [name] CMA (August 31, 2009 8:51 AM)  Physical Exam  General:  tall, obese male, NAD Head:  normocephalic and atraumatic Eyes:  PERRLA/EOM intact; conjunctiva and lids normal. Nose:  no deformity, discharge, inflammation, or lesions Mouth:  Teeth, gums and palate normal. Oral mucosa normal. Neck:  supple, JVP 11cm Lungs:  Clear bilaterally to auscultation and percussion. Heart:  RRR with frequent ectopy, laterally displaced PMI 1/6 SEM at the apex. Abdomen:  Bowel sounds positive; abdomen soft and non-tender without masses, organomegaly, or hernias noted. No hepatosplenomegaly. Msk:  Back normal, normal gait. Muscle strength  and tone normal. Pulses:  pulses normal in all 4 extremities Extremities:  No clubbing or cyanosis.  no edema Neurologic:  Alert and oriented x 3. strength/sensation are intact Skin:  Intact without lesions or rashes. Cervical Nodes:  no significant adenopathy Psych:  Normal affect.    Echocardiogram  Procedure date:  09/07/2008  Findings:       SUMMARY   -  The left ventricle was markedly dilated. Overall left ventricular         systolic function was severely reduced. Left ventricular         ejection fraction was estimated to be 15 %. There was severe         diffuse left ventricular hypokinesis. Left ventricular wall         thickness was mildly increased.   -  There was no atheroma of the aortic arch.   -  There was moderate mitral valvular regurgitation.   -  Negative bubble study. The left atrium was moderately dilated.         The left atrial appendage function was normal (normal         emptying velocity). There was no left atrial appendage         thrombus identified.   -  The right atrium was mildly dilated.   -  small pericardial effusion anterior to the heart.     ---------------------------------------------------------------    Prepared and Electronically Authenticated by    Peter Martinique M.D.   Echocardiogram  Procedure date:  09/06/2008  Findings:        SUMMARY   -  The left ventricle was markedly dilated. Overall left ventricular         systolic function was severely reduced. Left ventricular         ejection fraction was estimated , range being 10 % to 15 %.         There was severe diffuse left ventricular hypokinesis. Left         ventricular wall thickness was mildly to moderately         increased.   -  There was malcoaptation of the mitral valve leaflets. There was         moderate to severe mitral valvular regurgitation.   -  The left atrium was markedly dilated.   -  The right atrium was mildly dilated.   -  There was no apparent left  ventricular thrombus however diffuse         and severe wall motion abnormality provides conditions         conducive to thrombus formation.    ---------------------------------------------------------------    Prepared and Electronically Authenticated by    Candee Furbish M.D.  Carotid Doppler  Procedure date:  09/06/2008  Findings:       SUMMARY:   -  Bilateral no significant plaque visualized. No ICA stenosis.         Vertebral artery flow antegrade.  MRI Brain  Procedure date:  09/06/2008    IMPRESSION:   Normal intracranial [name] angiography of the large and medium-sized   vessels.  CXR  Procedure date:  09/06/2008  Findings:       Clinical Data: Chest pain.    CHEST - 2 VIEW 09/06/2008:    Comparison: Two-view chest x-ray 10/09/2006 and 12/29/2002.    Findings: Heart markedly enlarged, with interval increase in size   in the interval.  Pulmonary venous hypertension with minimal   interstitial pulmonary edema as evidenced by scattered Kerley B   lines.  No localized airspace consolidation.  No pleural effusions.   Minimal degenerative changes in the thoracic spine.    IMPRESSION:   Marked cardiomegaly.  Pulmonary venous hypertension with minimal   interstitial pulmonary edema.    Read By:  Deniece Portela,  M.D.   Released By:  Deniece Portela,  M.D.   Echocardiogram  Procedure date:  08/01/2009  Findings:      [facility]  Impression:  Marked lef ventricular enlargtement with severe global hypokinesis and overall marked systolic dysfunction. Moderate left atrial enlargement. Moderate mitral insfficiency. Moderate pulmonary hypertension. Compared to a prior study dated 04-06-08 LV chamber size has increased. EF has decreased from 20-25% to 15-20%. Mitral insufficiency and pulmonary pressures have increased.    EKG  Procedure date:  08/31/2009  Findings:      sinus rhythm LBBB (QRS 163ms), frequent PVCs  Impression &  Recommendations:  Problem # 1:  CARDIOMYOPATHY, DILATED (ICD-425.4) The patient has a nonischemic CM (EF 15-20%), NYHA Class III CHF, and LBBB.  His medicines have been optimized, though this is further limited by hypotension.  He meets SCD-HeFT criteria for ICD implantation.  Given dyssynchrony by ekg, I also believe that he may benefit from resynchronization therapy.  Risks, benefits, alternatives to BiV ICD implantation were discussed in detail with the patient and his spouse  today.  He understands that the risks include but are not limited to bleeding, infection, pneumothorax, perforation, tamponade, vascular damage, renal failure, MI, stroke, death, inappropriate shocks, and lead dislodgement.  He accepts these risks and wishes to proceed.  We will plan BiV ICD implantation at the next available time.  Problem # 2:  ATRIAL FIBRILLATION (ICD-427.31) The patient has a h/o afib with cva.  He is appropriately anticoagulated with coumadin.  No changes today.  Problem # 3:  COUMADIN THERAPY (ICD-V58.61) goal INR 2-3 Orders: Bi-V ICD (Bi-V ICD)  Problem # 4:  VENTRICULAR ECTOPY (ICD-427.69) Continue coreg. ICD implantation advised.  The following medications were removed from the medication list:    Lisinopril 40 Mg Tabs (Lisinopril) His updated medication list for this problem includes:    Carvedilol 3.125 Mg Tabs (Carvedilol) .Marland Kitchen... Take one tablet by mouth once daily    Warfarin Sodium 5 Mg Tabs (Warfarin sodium) ..... Use as directed by anticoagulation clinic    Ramipril 5 Mg Caps (Ramipril) .Marland Kitchen... Take one capsule by mouth daily  Orders: TLB-BMP (Basic Metabolic Panel-BMET) (99991111) TLB-CBC Platelet - w/Differential (85025-CBCD) TLB-PT (Protime) (85610-PTP) TLB-PTT (85730-PTTL)  Problem # 5:  HYPERLIPIDEMIA (ICD-272.4) stable no medicine changes  Problem # 6:  MORBID OBESITY, HX OF (ICD-V13.8) Pt continues to work at weight loss   Orders: EKG w/ Interpretation  (93000) TLB-BMP (Basic Metabolic Panel-BMET) (99991111) TLB-CBC Platelet - w/Differential (85025-CBCD) TLB-PT (Protime) (85610-PTP) TLB-PTT (85730-PTTL) Bi-V ICD (Bi-V ICD)  Patient Instructions: 1)  We will schedule bIv ICD implantation.

## 2010-09-26 NOTE — Cardiovascular Report (Signed)
Summary: Office Visit   Office Visit   Imported By: Sallee Provencal 10/04/2009 15:36:29  _____________________________________________________________________  External Attachment:    Type:   Image     Comment:   External Document

## 2010-09-26 NOTE — Assessment & Plan Note (Signed)
Summary: device/saf   Visit Type:  Follow-up Referring Provider:  Peter Martinique, MD Primary Provider:  Rolm Bookbinder, MD   History of Present Illness: The patient presents today for electrophysiology followup. Since last being seen in our office, he has done amazingly well.  He denies any symptoms of arrhythmia.  He reports that his energy has significantly improved.  The patient denies symptoms of chest pain, orthopnea, PND, lower extremity edema, dizziness, presyncope, syncope, or neurologic sequela.  He reports occasional dizziness.  The patient is tolerating medications without difficulties and is otherwise without complaint today.   Current Medications (verified): 1)  Carvedilol 3.125 Mg Tabs (Carvedilol) .Marland Kitchen.. 1 Tab Two Times A Day 2)  Warfarin Sodium 5 Mg Tabs (Warfarin Sodium) .... Use As Directed By Anticoagulation Clinic 3)  Lisinopril 2.5 Mg Tabs (Lisinopril) .... Take One Tablet By Mouth Daily 4)  Amiodarone Hcl 200 Mg Tabs (Amiodarone Hcl) .... Take One Tablet By Mouth Daily 5)  Prilosec 20 Mg Cpdr (Omeprazole) .... Once Daily 6)  Aspirin Ec 325 Mg Tbec (Aspirin) .... Take One Tablet By Mouth Daily  Allergies (verified): No Known Drug Allergies  Past History:  Past Medical History: Reviewed history from 12/21/2009 and no changes required. ATRIAL FIBRILLATION (ICD-427.31) Nonischemic CM (EF 15-20%) s/p LVAD implant 3/11 NYHA Class III CHF COUMADIN THERAPY (ICD-V58.61) PVCs HYPERTENSION, UNSPECIFIED (ICD-401.9) HYPERLIPIDEMIA (ICD-272.4) MORBID OBESITY s/p lab band procedure CEREBROVASCULAR ACCIDENT 1/10 ( Right cortical subcortical acute brain infarct) DIABETES MELLITUS, TYPE II (ICD-250.00) OBSTRUCTIVE SLEEP APNEA (ICD-327.23) Ventricular Tachycardia 11/07/09, now on Amiodarone  Past Surgical History: Reviewed history from 12/21/2009 and no changes required.  laparoscopic gastric banding in June 2008.   recent hammer toe surgery  LVAD 3/11  Social  History: Reviewed history from 08/31/2009 and no changes required. Pt lives in [place] with spouse.  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  Remote h/o mild ETOH, none recently.  He is married and has children.      Review of Systems       All systems are reviewed and negative except as listed in the HPI.   Vital Signs:  Patient profile:   50 year old male Height:      78 inches Weight:      262 pounds BMI:     30.39 Pulse rate:   91 / minute BP sitting:   98 /   (left arm)  Vitals Entered By: Margaretmary Bayley CMA (March 17, 2010 1:46 PM)  Physical Exam  General:  tall, obese male, NAD,  chronically ill Head:  normocephalic and atraumatic Eyes:  PERRLA/EOM intact; conjunctiva and lids normal. Mouth:  Teeth, gums and palate normal. Oral mucosa normal. Neck:  supple, JVP 11cm Lungs:  Clear bilaterally to auscultation and percussion. Heart:  RRR with frequent ectopy, laterally displaced PMI 1/6 SEM at the apex. Abdomen:  Bowel sounds positive; abdomen soft and non-tender without masses, organomegaly, or hernias noted. No hepatosplenomegaly. Msk:  Back normal, normal gait. Muscle strength and tone normal. Pulses:  pulses normal in all 4 extremities Extremities:  No clubbing or cyanosis.  no edema Neurologic:  Alert and oriented x 3. strength/sensation are intact    ICD Specifications Following MD:  Thompson Grayer, MD     Referring MD:  Martinique ICD Vendor:  [employer]     ICD Model Number:  [id number]     ICD Serial Number:  [id number] ICD DOI:  09/06/2009     ICD Implanting MD:  Jeneen Rinks  Zoey Gilkeson, MD  Lead 1:    Location: RA     DOI: 09/06/2009     Model #: [id number]     Serial #: [id number]     Status: active Lead 2:    Location: RV     DOI: 09/06/2009     Model #: [id number]     Serial #: [id number]     Status: active Lead 3:    Location: LV     DOI: 09/06/2009     Model #: [id number]     Serial #: [id number]     Status: active  Indications::  DCM, CHF   ICD Follow Up Remote  Check?  No Battery Voltage:  3.16 V     Charge Time:  8.3 seconds     Underlying rhythm:  SR ICD Dependent:  No       ICD Device Measurements Atrium:  Amplitude: 4.6 mV, Impedance: 437 ohms, Threshold: 0.75 V at 0.4 msec Right Ventricle:  Amplitude: 10.3 mV, Impedance: 532 ohms, Threshold: 1.25 V at 0.4 msec Left Ventricle:  Impedance: 285 ohms, Threshold: 0.875 V at 0.4 msec Configuration: LV RING TO RV COIL Shock Impedance: 44/55 ohms   Episodes MS Episodes:  0     Percent Mode Switch:  0     Coumadin:  Yes Shock:  0     ATP:  0     Nonsustained:  0     Atrial Pacing:  99.6%     Ventricular Pacing:  99.5%  Brady Parameters Mode DDDR     Lower Rate Limit:  80     Upper Rate Limit 130 PAV 130     Sensed AV Delay:  100  Tachy Zones VF:  207     Next Remote Date:  06/15/2010     Next Cardiology Appt Due:  08/27/2010 Tech Comments:  FFRW sensing noted, Atrial sensitivity reprogrammed 0.106mV.  Lead impedance alerts reprogrammed. Optivol and thoracic impedance normal.  [facility] transmissiions every 3 months.  ROV 6 months with Dr. Rayann Heman. Alma Friendly, LPN  July 22, [id number] PM  MD Comments:  return in 6 months  Impression & Recommendations:  Problem # 1:  VENTRICULAR TACHYCARDIA (ICD-427.1) doing well without further arrhythmijas pt will have LFTs and TFTs with Dr Doug Sou office normal BiV ICD function no changes today  Problem # 2:  CARDIOMYOPATHY, DILATED (ICD-425.4) He has chronic systolic dysfunction but is doing well s/p LVAD No changes today  Pt enocuraged to avoid salt.  Patient Instructions: 1)  Your physician recommends that you schedule a follow-up appointment in: 6 months with Dr Rayann Heman

## 2010-11-11 LAB — GLUCOSE, CAPILLARY
Glucose-Capillary: 102 mg/dL — ABNORMAL HIGH (ref 70–99)
Glucose-Capillary: 88 mg/dL (ref 70–99)
Glucose-Capillary: 89 mg/dL (ref 70–99)
Glucose-Capillary: 93 mg/dL (ref 70–99)

## 2010-11-11 LAB — PROTIME-INR: Prothrombin Time: 24.8 seconds — ABNORMAL HIGH (ref 11.6–15.2)

## 2010-11-17 LAB — POCT I-STAT, CHEM 8
Calcium, Ion: 1.14 mmol/L (ref 1.12–1.32)
Chloride: 103 mEq/L (ref 96–112)
Glucose, Bld: 96 mg/dL (ref 70–99)
Hemoglobin: 13.3 g/dL (ref 13.0–17.0)
Sodium: 138 mEq/L (ref 135–145)
TCO2: 29 mmol/L (ref 0–100)

## 2010-11-17 LAB — CBC
HCT: 39.2 % (ref 39.0–52.0)
Hemoglobin: 13.3 g/dL (ref 13.0–17.0)
MCHC: 34 g/dL (ref 30.0–36.0)
MCV: 91.9 fL (ref 78.0–100.0)
Platelets: 139 10*3/uL — ABNORMAL LOW (ref 150–400)
RBC: 4.27 MIL/uL (ref 4.22–5.81)
RDW: 14.8 % (ref 11.5–15.5)
WBC: 6.5 10*3/uL (ref 4.0–10.5)

## 2010-11-17 LAB — DIFFERENTIAL
Eosinophils Absolute: 0.2 10*3/uL (ref 0.0–0.7)
Lymphocytes Relative: 11 % — ABNORMAL LOW (ref 12–46)
Lymphs Abs: 0.7 10*3/uL (ref 0.7–4.0)
Monocytes Absolute: 0.4 10*3/uL (ref 0.1–1.0)
Neutro Abs: 5.2 10*3/uL (ref 1.7–7.7)
Neutrophils Relative %: 80 % — ABNORMAL HIGH (ref 43–77)

## 2010-11-17 LAB — POCT CARDIAC MARKERS
CKMB, poc: 1.3 ng/mL (ref 1.0–8.0)
Troponin i, poc: <0.05 ng/mL (ref 0.00–0.09)

## 2010-11-17 LAB — RAPID URINE DRUG SCREEN, HOSP PERFORMED
Amphetamines: NOT DETECTED
Cocaine: NOT DETECTED
Tetrahydrocannabinol: NOT DETECTED

## 2010-11-22 ENCOUNTER — Other Ambulatory Visit: Payer: Self-pay | Admitting: *Deleted

## 2010-11-22 DIAGNOSIS — I420 Dilated cardiomyopathy: Secondary | ICD-10-CM

## 2010-11-22 MED ORDER — FUROSEMIDE 80 MG PO TABS: 80.0000 mg | ORAL_TABLET | Freq: Two times a day (BID) | ORAL | Status: DC

## 2010-11-22 NOTE — Telephone Encounter (Signed)
L/m for pt to return call

## 2010-11-22 NOTE — Telephone Encounter (Signed)
Message copied by Derek Mound on Wed Nov 22, 2010  5:09 PM ------      Message from: Thomes Lolling      Created: Wed Nov 22, 2010 10:12 AM      Regarding: LASIK/JORDAN      Contact: [phone]       CALL PT ABOUT HIS LASIK. CHART IN BOX.  1012AM

## 2010-11-22 NOTE — Telephone Encounter (Signed)
Called for refill for lasix; escribed to [facility]

## 2010-11-22 NOTE — Telephone Encounter (Signed)
Message copied by Derek Mound on Wed Nov 22, 2010  1:48 PM ------      Message from: Thomes Lolling      Created: Wed Nov 22, 2010 10:12 AM      Regarding: LASIK/JORDAN      Contact: [phone]       CALL PT ABOUT HIS LASIK. CHART IN BOX.  1012AM

## 2010-12-11 LAB — URINALYSIS, ROUTINE W REFLEX MICROSCOPIC
Bilirubin Urine: NEGATIVE
Glucose, UA: NEGATIVE mg/dL
Ketones, ur: NEGATIVE mg/dL
Nitrite: NEGATIVE
Specific Gravity, Urine: 1.011 (ref 1.005–1.030)
pH: 7 (ref 5.0–8.0)

## 2010-12-11 LAB — RAPID URINE DRUG SCREEN, HOSP PERFORMED
Amphetamines: NOT DETECTED
Barbiturates: NOT DETECTED
Benzodiazepines: NOT DETECTED
Cocaine: NOT DETECTED
Opiates: NOT DETECTED

## 2010-12-11 LAB — CARDIOLIPIN ANTIBODIES, IGG, IGM, IGA
Anticardiolipin IgA: 8 [APL'U] — ABNORMAL LOW (ref <13)
Anticardiolipin IgM: <7 [MPL'U] — ABNORMAL LOW (ref <10)

## 2010-12-11 LAB — APTT
aPTT: 29 seconds (ref 24–37)
aPTT: 29 seconds (ref 24–37)

## 2010-12-11 LAB — FACTOR 5 LEIDEN

## 2010-12-11 LAB — LUPUS ANTICOAGULANT PANEL: Lupus Anticoagulant: NOT DETECTED

## 2010-12-11 LAB — CBC
Hemoglobin: 13.7 g/dL (ref 13.0–17.0)
MCV: 91.3 fL (ref 78.0–100.0)
RBC: 4.45 MIL/uL (ref 4.22–5.81)

## 2010-12-11 LAB — LIPID PANEL
Cholesterol: 120 mg/dL (ref 0–200)
LDL Cholesterol: 78 mg/dL (ref 0–99)
Total CHOL/HDL Ratio: 3.8 RATIO
Triglycerides: 48 mg/dL (ref <150)
VLDL: 10 mg/dL (ref 0–40)

## 2010-12-11 LAB — DIFFERENTIAL
Basophils Absolute: 0.1 10*3/uL (ref 0.0–0.1)
Basophils Relative: 1 % (ref 0–1)
Eosinophils Absolute: 0.2 10*3/uL (ref 0.0–0.7)
Eosinophils Relative: 2 % (ref 0–5)
Monocytes Absolute: 0.4 10*3/uL (ref 0.1–1.0)
Neutro Abs: 6.8 10*3/uL (ref 1.7–7.7)

## 2010-12-11 LAB — COMPREHENSIVE METABOLIC PANEL
ALT: 8 U/L (ref 0–53)
Alkaline Phosphatase: 50 U/L (ref 39–117)
BUN: 3 mg/dL — ABNORMAL LOW (ref 6–23)
Chloride: 105 mEq/L (ref 96–112)
Glucose, Bld: 96 mg/dL (ref 70–99)
Potassium: 3.1 mEq/L — ABNORMAL LOW (ref 3.5–5.1)
Sodium: 142 mEq/L (ref 135–145)
Total Bilirubin: 1.4 mg/dL — ABNORMAL HIGH (ref 0.3–1.2)

## 2010-12-11 LAB — GLUCOSE, CAPILLARY: Glucose-Capillary: 163 mg/dL — ABNORMAL HIGH (ref 70–99)

## 2010-12-11 LAB — BETA-2-GLYCOPROTEIN I ABS, IGG/M/A
Beta-2 Glyco I IgG: <4 U/mL (ref <20)
Beta-2-Glycoprotein I IgA: <4 U/mL (ref <10)
Beta-2-Glycoprotein I IgM: <4 U/mL (ref <10)

## 2010-12-11 LAB — BASIC METABOLIC PANEL
BUN: 6 mg/dL (ref 6–23)
Calcium: 9.1 mg/dL (ref 8.4–10.5)
Creatinine, Ser: 0.95 mg/dL (ref 0.4–1.5)
GFR calc non Af Amer: >60 mL/min (ref >60)
Glucose, Bld: 90 mg/dL (ref 70–99)

## 2010-12-11 LAB — RPR: RPR Ser Ql: NONREACTIVE

## 2010-12-11 LAB — CARDIAC PANEL(CRET KIN+CKTOT+MB+TROPI)
CK, MB: 2.5 ng/mL (ref 0.3–4.0)
CK, MB: 2.7 ng/mL (ref 0.3–4.0)
Relative Index: 2 (ref 0.0–2.5)

## 2010-12-11 LAB — ANA: Anti Nuclear Antibody(ANA): NEGATIVE

## 2010-12-11 LAB — COMPLEMENT, TOTAL: Compl, Total (CH50): 60 U/mL (ref 31–66)

## 2010-12-11 LAB — PROTIME-INR
INR: 1.2 (ref 0.00–1.49)
Prothrombin Time: 16.5 seconds — ABNORMAL HIGH (ref 11.6–15.2)

## 2010-12-11 LAB — POCT I-STAT, CHEM 8
BUN: <3 mg/dL — ABNORMAL LOW (ref 6–23)
Calcium, Ion: 1.09 mmol/L — ABNORMAL LOW (ref 1.12–1.32)
Creatinine, Ser: 0.9 mg/dL (ref 0.4–1.5)
Glucose, Bld: 111 mg/dL — ABNORMAL HIGH (ref 70–99)
TCO2: 24 mmol/L (ref 0–100)

## 2010-12-11 LAB — PROTEIN S, TOTAL: Protein S Ag, Total: 77 % (ref 70–140)

## 2010-12-11 LAB — HEMOGLOBIN A1C
Hgb A1c MFr Bld: 5.7 % (ref 4.6–6.1)
Mean Plasma Glucose: 117 mg/dL

## 2010-12-11 LAB — PROTEIN S ACTIVITY: Protein S Activity: 103 % (ref 69–129)

## 2010-12-11 LAB — TROPONIN I: Troponin I: 0.01 ng/mL (ref 0.00–0.06)

## 2010-12-11 LAB — C4 COMPLEMENT: Complement C4, Body Fluid: 35 mg/dL (ref 16–47)

## 2010-12-11 LAB — PROTEIN C, TOTAL: Protein C, Total: 76 % (ref 70–140)

## 2010-12-11 LAB — BRAIN NATRIURETIC PEPTIDE: Pro B Natriuretic peptide (BNP): 703 pg/mL — ABNORMAL HIGH (ref 0.0–100.0)

## 2010-12-11 LAB — POCT CARDIAC MARKERS: Troponin i, poc: <0.05 ng/mL (ref 0.00–0.09)

## 2011-01-09 NOTE — Op Note (Signed)
[name], [name]                ACCOUNT NO.:  [id number]   MEDICAL RECORD NO.:  [id number]          PATIENT TYPE:  OIB   LOCATION:  [id number]                         FACILITY:  [facility]   PHYSICIAN:  Isabel Caprice. Hassell Done, MD  DATE OF BIRTH:  18-Oct-1960   DATE OF PROCEDURE:  01/27/2007  DATE OF DISCHARGE:                               OPERATIVE REPORT   PREOPERATIVE DIAGNOSES:  1. Morbid obesity.  2. Diabetes.   POSTOPERATIVE DIAGNOSES:  1. Morbid obesity.  2. Diabetes.   PROCEDURE:  Laparoscopic gastric banding using a lap band AP large  system.   SURGEON:  Isabel Caprice. Hassell Done, MD.   ASSISTANT:  Jonne Ply, MD.   ANESTHESIA:  General endotracheal anesthesia.   ESTIMATED BLOOD LOSS:  Minimal.   DESCRIPTION OF PROCEDURE:  [name] is a very tall, massive obese man  with multiple comorbidities who was taken to room #1 on January 27, 2007,  and given general anesthesia.  The abdomen was prepped with Techni-Care  and draped sterilely.  Access of the abdomen was gained through the left  upper quadrant using OptiVu technique and an 11 trocar 0 degree scope.  This was done without difficulty and the abdomen was insufflated.  The  usual port placements were used including 15 in the right lower port, 11  in the upper port  and 11 below to the left of the umbilicus.  A 5 mm  was placed in the upper midline through which a Nathanson retractor was  inserted.  The patient had some adenopathy along the lesser curve which  we avoided and identified the little pars flaccida and opened that area  and then passed the band passer posteriorly and came up where we had  previously identified the spot just below the area of the left crus.  The large APL lap band by Allergan was inserted through the 15 port and  then put through the band passer and brought around and snapped.  The  band sizer was passed by Dr. Zettie Pho.  Initially with the band snapped it  would not get through, but with rewinding and  came through easily.  This  was then removed.  The band passer was then plicated with three sutures  using the articulating [employer] suturing device and 2-0  Surgidac.  Three sutures were placed and secured with tie knots and then  a plication suture was also placed to prevent herniation.  Band appeared  to be in good position and tubing was brought out through the 15 port.  The port site was closed with a simple 2-0 Vicryl and 2-0 Prolenes were  placed in the fascia through the port which was connected after allowing  the fluid pressure to bleed off and after it was connected, it was tied  and placed down on  the fascia with four sutures of 2-0 Prolene.  Wounds were irrigated and  closed with 4-0 Vicryl and the port sites closed with Dermabond and a  few of them were closed with staples as well.  The patient seemed to  tolerate the procedure  well and was taken to the recovery room in  satisfactory condition.      Isabel Caprice Hassell Done, MD  Electronically Signed     MBM/MEDQ  D:  01/27/2007  T:  01/27/2007  Job:  [id number]   cc:   Judithann Sauger, M.D.  Fax: [phone]

## 2011-01-09 NOTE — H&P (Signed)
[name], [name] NO.:  [id number]   MEDICAL RECORD NO.:  [name]          PATIENT TYPE:  INP   LOCATION:  3736                         FACILITY:  [facility]   PHYSICIAN:  Girard Cooter, MD         DATE OF BIRTH:  1960-10-23   DATE OF ADMISSION:  09/06/2008  DATE OF DISCHARGE:                              HISTORY & PHYSICAL   PRIMARY CARE DOCTOR:  Dr. Carol Ada.   PULMONOLOGIST:  Dr. Baird Lyons.   BRIEF HISTORY OF PRESENT ILLNESS:  The patient is 50 year old who  presents with weakness located in the left-sided facial droop, slurred  speech.  Symptoms occurred about 45 minutes after he awoke from a sleep.  The patient went to bed about 8:45 and at that time was symptom free.  The patient presented to the emergency room greater than 12 hours after  his symptoms.  He denied any chest pain, shortness of breath, visual  changes or focal strength weakness.  He denies headaches, fevers,  chills, photophobia and photosensitivity.  He has never had similar  symptoms.   PAST MEDICAL HISTORY:  Significant for:  1. Obstructive sleep apnea with good control.  2. History of cardiomyopathy and systolic function.  3. Obesity status post laparoscopic gastric banding in June 2008.  4. Diabetes.  5. Hypertension.  6. Anxiety.   SOCIAL HISTORY:  The patient denies drugs, alcohol, tobacco.   ALLERGIES:  No known drug allergies.   HOME MEDICATIONS:  Include:  1. Ambien 12.5 mg p.o. once daily.  2. Lasix 40 mg p.o. daily.  3. __________5 mg p.o. daily.  4. Simvastatin 10 mg p.o. daily.   REVIEW OF SYSTEMS:  As per HPI or otherwise negative.   PHYSICAL EXAMINATION:  Generally speaking, the patient is in no acute  distress.  Blood pressure 114/81.  Pulse 77.  Respirations 20.  Temperature 98.7.  Pulse ox is 98% on room air.  HEENT:  He is normocephalic, atraumatic.  There is an obvious left-sided  facial droop.  CARDIOVASCULAR:  S1, S2 regular rate and rhythm.  No  murmurs, rubs,  clicks.  LUNGS:  Clear to auscultation without rhonchi, rales or wheezes.  ABDOMEN:  Soft, nontender, nondistended.  Positive bowel sounds.  EXTREMITIES:  No clubbing, cyanosis or edema.  Patient is alert and  oriented x3.  Cranial nerves II through XII grossly intact. Strength 5/5  bilaterally upper and lower extremities.  Reflexes 2+ bilaterally upper  lower extremities.  There is obvious slurring of his speech.  His speech  content is intact.  There is a left-sided facial droop.  In the  emergency room it was noted that patient was 5 hours out of the window  of the TPA and it was noted that the patient's stroke scale is not  enough for GPA.   LABORATORY DATA:  Potassium 3.0, BUN less than 3.  CBC normal.  CT scan  of the head shows no acute intracranial abnormalities.   ASSESSMENT/PLAN:  1. The patient is a 50 year old with a left-sided facial droop and  slurring of the speech; symptoms consistent with a cerebrovascular      accident versus a transient ischemic attack yet to be determined.  2. Hypertension.  3. Obstructive sleep apnea on a CPAP.  4. Obesity status post lap band.  5. History of cardiomyopathy with systolic dysfunction.   DISCUSSION/PLAN:  The patient's signs and symptoms are suggestive of  cerebrovascular accident.  Urgent neurology consultation will be  requested at this time.  The patient has been given aspirin times one.  We will proceed with cerebrovascular accident workup, 2-D  echocardiogram, bilateral carotid Dopplers, and MRI, MRA of the brain,  stroke panel and including fasting lipid profile, ANA, RPR, TSH,  hypercoagulable profile will also be sent out.  The patient will be  continued on CPAP.  A 2-D echocardiogram with a bubble study will  proceed.  The patient will be continued on his home regimen of Lasix.  Permissive blood pressures will be applied.  Lamictal will be continued  as will simvastatin.  Fasting lipid profile will  proceed.  The rest of  the plans are dependent on his progress and consultant recommendations.      Girard Cooter, MD  Electronically Signed     RR/MEDQ  D:  09/06/2008  T:  09/06/2008  Job:  [id number]

## 2011-01-09 NOTE — Consult Note (Signed)
[name], [name] NO.:  [id number]   MEDICAL RECORD NO.:  [id number]          PATIENT TYPE:  INP   LOCATION:  3736                         FACILITY:  [facility]   PHYSICIAN:  Peter M. Martinique, M.D.  DATE OF BIRTH:  1960/11/17   DATE OF CONSULTATION:  09/06/2008  DATE OF DISCHARGE:                                 CONSULTATION   HISTORY OF PRESENT ILLNESS:  [name] is a 50 year old black male who I  am seeing at the request of Dr. [name] for evaluation of potential  cardiac source of his stroke.  The patient has a longstanding history of  severe dilated cardiomyopathy dating back to 1999.  Cardiac  catheterization at that time showed normal coronary anatomy.  He has no  known history of atrial fibrillation.  Ejection fraction at that time  showed ejection fraction of 25-30%.  Over the years, he has been treated  with aggressive medical therapy including Coreg, Altace, BiDil, Lanoxin,  Aldactone, and Lasix at fairly substantial doses.  However, in 2008, he  had gastric banding procedure and lost from 443 pounds down to his  current weight of 277 pounds.  With this, he had resolution of the  shortness of breath.  He had to significantly curtail his medications  due to hypotension.  As a result, he is currently just been on Lasix and  Altace and has been unable to tolerate even the lowest dose of Coreg.  He denies any shortness of breath currently.  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  The patient was admitted last night after awakening with  slurred speech and left facial droop.  He has been diagnosed with CVA  and the right MCA distribution.  His carotid Dopplers were unremarkable.  His echocardiogram today showed marked left ventricular enlargement with  severe global hypokinesia with the ejection fraction of 10-15%.  There  is  moderate to severe mitral insufficiency and moderate left atrial  enlargement.  His ECG shows normal sinus rhythm with PVCs and  nonspecific interventricular conduction delay with QRS duration of 130  milliseconds.  I have reviewed his monitor and although there are a lot  of tracings that have significant artifact, I do not see any convincing  evidence of atrial fibrillation.  It appears to be mostly normal sinus  rhythm with frequent PVCs and a lot of artifact.   PAST MEDICAL HISTORY:  He has a history of diabetes mellitus type 2 that  has resolved with his significant weight loss.  He has a history of  hypertension.  He has chronic PVCs.  He has a history of morbid obesity.  He is status post Lap-Band surgery with excellent results.  His  medications prior to admission include Lasix 40 mg per day, aspirin  daily which she apparently had not been taking for several days and  Altace 5 mg per day.   ALLERGIES:  He has no known allergies.   SOCIAL  HISTORY:  He denies tobacco or alcohol use.  He is married and  has children.   FAMILY HISTORY:  His father had history of bypass surgery and  angioplasty.  His mother died of at age 61 with myocardial infarction  and lupus.  He has four siblings who are in good health.   REVIEW OF SYSTEMS:  He has had no recent change in bowel or bladder  habits.  He denies any increased edema, orthopnea, or PND.  He has no  history of bleeding tendencies or ulcers.  All other systems are  reviewed and are negative.   PHYSICAL EXAMINATION:  GENERAL:  The patient is a pleasant black male in  no apparent distress.  VITAL SIGNS:  He is afebrile.  His blood pressure is 109/80, pulse of  73, sats are 95% on room air.  HEENT:  Normocephalic and atraumatic.  His pupils are equal, round, and  reactive to light and accommodation.  He has mild dysarthria.  He has no  focal facial weakness.  Oropharynx is clear.  NECK:  Supple without JVD, adenopathy, thyromegaly,  or bruits.  LUNGS:  Clear to auscultation and percussion.  CARDIAC:  Regular rate and rhythm with a loud summation gallop.  There  is a soft systolic murmur at the apex.  ABDOMEN:  Soft and nontender without mass or bruits.  EXTREMITIES:  Without edema.  Pedal pulses are 2+ and symmetric.  NEUROLOGIC:  Function is as noted.  Cranial nerves are otherwise are  intact.  His mood is appropriate.   LABORATORY DATA:  ECG is as noted in H and P.  TSH was normal at 0.869.  Cardiac enzymes were negative x2.  Sed rate was 4.  Cholesterol 120,  triglycerides 48, HDL 32, LDL 78.  Urine drug screen was negative.  Coags were normal.  Sodium 140, potassium 3.0, chloride 100, glucose  111, BUN 3, creatinine 0.9.  CBC was normal.   IMPRESSION:  1. Severe dilated cardiomyopathy.  The patient is functionally class I      to II.  2. Right middle cerebral artery cerebrovascular accident probably due      to severe dilated cardiomyopathy, although echocardiogram does not      show a definite thrombus.  3. Morbid obesity, status post gastric banding procedure with      substantial weight loss.  4. History of hypertension, resolved.  5. History of diabetes mellitus, resolved.  6. Premature ventricular contractions.   PLAN:  We will proceed with TEE tomorrow to complete his stroke  evaluation, also to further evaluate his mitral insufficiency and rule  out aortic pathology.  Even if no thrombus is seen, then I think we can  safely assume that his stroke is cardiac related given his marked left  ventricular enlargement and markedly reduced LV function and I would  recommend he go on long-term Coumadin therapy.  We will add Lanoxin for  his congestive heart failure.  We would continue his ACE inhibitor and  Lasix.  Functionally, the patient is currently doing too well to really  consider him for biventricular pacing or transplant evaluation.           ______________________________  Peter M. Martinique,  M.D.     PMJ/MEDQ  D:  09/06/2008  T:  09/07/2008  Job:  [id number]   cc:   Annita Brod, M.D.  Princess Bruins. Gaynell Face, M.D.

## 2011-01-09 NOTE — Discharge Summary (Signed)
[name], BRANDES NO.:  [id number]   MEDICAL RECORD NO.:  [name]          PATIENT TYPE:  INP   LOCATION:  [id number]                         FACILITY:  [facility]   PHYSICIAN:  Annita Brod, M.D.DATE OF BIRTH:  1961-04-13   DATE OF ADMISSION:  09/06/2008  DATE OF DISCHARGE:  09/08/2008                               DISCHARGE SUMMARY   PRIMARY CARE PHYSICIAN:  Dr. Carol Ada.   PULMONOLOGIST:  Dr. Keturah Barre.   CARDIOLOGIST:  Dr. Quay Burow.   CONSULTANTS:  Dr. Quay Burow, cardiology, Dr. Leonie Man, neurology.   DISCHARGE DIAGNOSES:  1. Right cortical subcortical acute brain infarct felt to be secondary      to embolic clots.  2. Atrial fibrillation.  3. History of ischemic cardiomyopathy with ejection fraction of 10%-      15% previously diagnosed.  4. Hypertension.  5. Previous history of morbid obesity status post gastric banding.  6. Hyperlipidemia.   DISCHARGE MEDICATIONS:  For this patient are as follows:  1. Ramipril 5 mg p.o. daily.  2. Lasix 40 p.o. daily.  3. Zocor 10 p.o. daily.  4. Lexapro 10 p.o. daily p.r.n.   NEW MEDICATIONS:  For this patient:  1. Coumadin.  He will start at 10 mg p.o. on the night of 01/13 and      then go down to 7.5 mg p.o. starting the night of 01/14.  His dose      will be continued at one at 7.5 mg until the patient has had a      Coumadin level checked by Dr. Quay Burow and the dose may be      adjusted.  2. Coreg 3.125 mg p.o. nightly.   DISCHARGE DIET:  The patient will be a heart-healthy diet.  He has been  evaluated for swallowing and his swallowing ability is fine.   ACTIVITY:  Will be as tolerated.  He has been evaluated by PT, OT who  found the patient to ambulate well with no issues and he had been  cleared for discharge by PT, OT.   FOLLOWUP APPOINTMENTS:  The patient will see Dr. Leonie Man, neurology in the  next 4-6 weeks at his office for follow up.  He will see his PCP Dr.  Carol Ada in 3-4 weeks.  He will see Dr. Quay Burow in the next  1-2 weeks for a followup appointment.  However, he will follow up on  this Friday September 10, 2008, for a Coumadin level check.  Patient is  also being given a referral to [facility] to follow up for  an eye exam.  He says that his vision has been off since this stroke has  occurred, but he is otherwise doing well with no episodes of loss of  vision.  He has never had an eye doctor or an eye exam done before and  he will have it done at this time.   HOSPITAL COURSE:  The patient is a 50 year old African American male  with past medical history of nonischemic cardiomyopathy with an EF of  10%-15% as well  as a history of morbid obesity status post gastric  banding surgery as well as sleep apnea who woke up from sleep on the  morning of September 06, 2008, and was found to have left-sided facial  droop, slurred speech.  His symptoms occurred for about 45 minutes after  he awoke and then he went to the emergency room.  He was not felt be a  tPA candidate because of the inability to quantify the time that his  symptoms occurred, but however after several hours of being in the  emergency room his symptoms for the most part had resolved including his  speech, his facial droop.  He had no one-sided weakness on his body and  he did not have any episodes of confusion.  Because this was a suspected  stroke, neurology was consulted and MRI, initial CAT scan of the head  showed no evidence of any acute CVA.  An MRI, MRA of the brain was  ordered as well as carotid Dopplers and echocardiogram and the patient  was started on aspirin 325 p.o. daily.  The patient's carotid Doppler  study was done on 01/11; no significant plaque was revealed, no evidence  of any review ICA stenosis.  A 2-D echo was done which showed again  greatly depressed ejection fraction but no evidence of any acute  vomitus.  The patient's MRI was done which  did show a 2-3 cm region of  acute infarction affecting his right parietal cortex with normal  intracranial MR angiography of the large and medium-sized vessel.  The  patient has history of nonischemic cardiomyopathy and it was suspected  that because of his atrial fibrillation this was the most likely cause  of his embolic CVA.  A TEE study was ordered.  The patient's  cardiologist, Dr. Quay Burow became involved in the case as well who  knows the patient quite well.  After a discussion with Dr. Gwenlyn Found and Dr.  Leonie Man from neurology, it was felt that the patient likely would need to  be on Coumadin which he had not previously been on and if no thrombus  was needed then a bridge for Lovenox, as discussed by a stroke team and  pharmacy, the patient would not need a Lovenox bridge and could be  stable just starting Lovenox; of course by starting Coumadin and  allowing levels to trend up on their own.  The patient's TEE showed no  evidence of any embolism, but regardless it was still felt by both  neurology and cardiology that he had an embolic CVA secondary to his  atrial fibrillation.  By 1/13 the patient was doing well.  He had been  evaluated by PT, OT who felt that he was medically stable and did not  need any further evaluation.  He was evaluated by speech therapy who did  not show any signs of swallowing or dysphagia.  The patient underwent a  Coumadin education including watching video and booklet and felt  comfortable as of 1/13 with knowledge of Coumadin and its use.   PLAN:  Will be as per pharmacy recommendation.  He had been started on  Coumadin on September 07, 2008.  He will receive a dose of 10 mg on the  night of 1/13 then decrease down to 7.5.  Likely he will go down to  either to approximately 5 mg once his INR is therapeutic.  Nevertheless,  he will have a level checked by Dr. Kennon Holter office on September 10, 2008.  Dr. Gwenlyn Found followed up with the patient in regards to his  nonischemic  cardiomyopathy, but because of the patient's high function he was felt  to be currently too good to warrant a biventricular pacer or transplant  consideration at this time.  Of course he will be very closely followed  incase that this should change.  The patient noted that his vision  seemed to be off at times ever since his stroke has occurred.  Most of  his symptoms for the most part had completely resolved.  His vision, he  says, is still off and he is requesting an eye exam.  I am  referring him to [facility] ophthal[facility] for a follow up eye exam.  The  patient's overall disposition from initial presentation is improved.  His activity will be as tolerated.  His discharge diet will be a heart-  healthy diet and he will follow up with the above doctors as listed.      Annita Brod, M.D.  Electronically Signed     SKK/MEDQ  D:  09/08/2008  T:  09/08/2008  Job:  [id number]   cc:   Tarri Fuller D. Annamaria Boots, MD, FCCP, FACP  Judithann Sauger, M.D.  Quay Burow, M.D.  Pramod P. Leonie Man, MD  Griffith Citron, MD  Roselie Awkward, MD

## 2011-01-09 NOTE — Consult Note (Signed)
[name], [name] NO.:  [id number]   MEDICAL RECORD NO.:  [id number]          PATIENT TYPE:  INP   LOCATION:  [id number]                         FACILITY:  [facility]   PHYSICIAN:  [name] Char, MD    DATE OF BIRTH:  03-Mar-1961   DATE OF CONSULTATION:  09/06/2008  DATE OF DISCHARGE:                                 CONSULTATION   REFERRING PHYSICIAN:  [place] [place] [name].   REASON FOR CONSULTATION:  Acute cerebral infarction.   HISTORY OF PRESENT ILLNESS:  This is a 50 year old African American man  who noticed facial weakness and difficulty with speech on waking up from  a nap last evening.  The patient went to bed about 8:45 p.m. and woke up  with the above deficits at about 11:30.  He was evaluated in the  emergency room and noticed to have facial weakness as well as improving  speech.  Facial weakness improved as well.  CT scan of his head showed  no acute intracranial abnormality including no hemorrhage.  The patient  was on no antiplatelet therapy and had been off aspirin for about 10  days.  He has continued to experience improvement in facial weakness.  Speech has returned to normal as well as his ability to swallow.  He had  no left upper extremity and no left lower extremity weakness or  numbness.  There was no facial numbness as well.   PAST MEDICAL HISTORY:  Remarkable for hypertension, diabetes mellitus,  and congestive heart failure.  He also has a history of morbid obesity.  He is status post lap-band gastric procedure and lost about 230 pounds.  He is currently weighing about 270 pounds.  The patient also has  hypercholesterolemia.   CURRENT MEDICATIONS:  1. Ambien 12.5 mg per day.  2. Lasix 40 mg per day.  3. Ramipril 5 mg per day.  4. Simvastatin 10 mg per day.  5. Aspirin with which he has not been compliant.   FAMILY HISTORY:  Positive for stroke involving both parents.  Family  history is otherwise unremarkable except for hypertension.   PHYSICAL EXAMINATION:  GENERAL:  Appearance was that of tall, obese,  middle-aged man who was alert and cooperative and in no acute distress.  He was well oriented to time as well as place.  Short term and long term  memory were normal.  Affect was appropriate.  HEENT:  Pupils were equal and reactive normally to light.  Extraocular  movements were full and conjugate.  Visual fields were intact and  normal.  There was no facial numbness.  He had mild residual left lower  facial weakness.  Hearing was normal.  NEUROLOGIC:  Speech was normal.  Palate movement was slightly  asymmetrical with elevation less on the left compared to the right.  Motor exam showed no pronator drift.  Manual muscle testing showed no  weakness proximally or distally in his upper and lower extremities.  Muscle tone was normal throughout.  Deep tendon reflexes were 1+ in the  upper extremities and absent at the knees and ankles.  Plantar responses  were flaccid.  Sensory exam was normal except for slightly reduced  vibratory sensation distally in the lower extremities.  Carotid  auscultation was normal.  His coordination was normal as well.   CLINICAL IMPRESSION:  Probable small subcortical right cerebral  infarction.   RECOMMENDATIONS:  1. MRI and MRA of the brain without contrast.  2. Carotid Doppler study.  3. Echocardiogram.  4. Resume aspirin 325 mg per day.   Thank you for asking me to evaluate [name].      [name] Char, MD  Electronically Signed     CS/MEDQ  D:  09/06/2008  T:  09/06/2008  Job:  [id number]

## 2011-01-12 NOTE — H&P (Signed)
NAME:  [name]                          ACCOUNT NO.:  [id number]   MEDICAL RECORD NO.:  [id number]                   PATIENT TYPE:  INP   LOCATION:  [id number]                                 FACILITY:  [facility]   PHYSICIAN:  Jerelene Redden, MD                   DATE OF BIRTH:  06/08/1961   DATE OF ADMISSION:  07/17/2003  DATE OF DISCHARGE:                                HISTORY & PHYSICAL   HISTORY OF PRESENT ILLNESS:  [name] is a pleasant 50 year old man who  states that over the last year he has lost about 130 pounds in weight  through dieting.  As a result of this he has had much better exercise  tolerance and he states that his sleep apnea symptoms have resolved.  In the  last 2-3 weeks he has had frequent hypoglycemic reactions, especially when  he is at work.  His usual practice is to take all of his medications when he  first gets up in the morning and then to eat a small breakfast.  When he is  at work he tends to skip lunch.  He will usually have low blood sugar  episodes just prior to lunchtime.  When these occur he will feel weak and  somewhat dizzy and will measure his blood sugar and find it to be in the  range of 40-60.  Today he states that he measured his blood sugar when he  felt weak and found it to be 20.  He presented to the [facility]  In clinic where he was given glucose and his blood sugar was monitored.  It  came up to 42, but understandably Dr. Tamala Julian felt that it was best to monitor  the patient in the hospital setting in light of his history of heart  problems.  Currently [name] states that he feels well and denies  headache, shortness of breath, orthopnea, PND, ankle edema, exertional chest  pain, nausea, vomiting, or abdominal pain.   PAST MEDICAL HISTORY:   ALLERGIES:  There are no known drug allergies.   CURRENT MEDICATIONS:  1. Actos 45 mg daily which he has taken for the last three or four years.  2. Lanoxin 0.25 mg daily,.  3.  Lasix 40 mg daily.  4. Lisinopril 40 mg daily.  5. Coreg 25 mg b.i.d.  6. Glipizide ER which he has been taking a dose of 10 mg b.i.d. and which     has cut the dose to 10 mg daily in the last few days.  7. He is also taking KCl  20 mEq daily.   ILLNESSES:  1. Type 2 diabetes.  He states he has about a four-year history of diabetes.     He was initially treated with insulin for about a year.  He has been     taking oral medications as noted above since that time.  When he was     first diagnosed his blood sugars were always very high.  Since he has     lost a substantial amount of weight his blood sugars have been     consistently in the range of 80-120.  2. Dilated cardiomyopathy.  The patient has documented nonischemic dilated     cardiomyopathy with ejection fraction estimated to be 20-30%.  On     November 18 he presented to the [facility] Room with acute     onset of shortness of breath and was found to have a heart rhythm in the     range of 90-100 with an idioventricular pattern.  He was given IV Lasix     with good response and his respiratory status seemed to improve.  He was     evaluated by Dr. Martinique, who recommended giving him potassium in light of     his potassium of 3.4 and also arranged for him to have an echo and Holter     study in his office.  These studies have been accomplished.  The results     are pending at this time.  Since this time he has not noticed any     recurrence of palpitation or irregular heartbeat.  3. Morbid obesity as described above.  4. Hypertension.  This has been well regulated.   OPERATIONS:  The patient has had no operative procedures except for a  diagnostic cardiac catheterization done by Dr. Martinique.   FAMILY HISTORY:  Noncontributory.   SOCIAL HISTORY:  The patient does not smoke.  He denies alcohol or drug  abuse.  He is working regularly.  The hypoglycemic reactions described above  have been interfering to some extent  with his ability to work.   REVIEW OF SYSTEMS:  HEAD:  He denies headache or dizziness.  EYES:  He  denies visual blurring or diplopia.  EARS, NOSE, THROAT:  Denies earaches,  sinus pain, or sore throat.  CHEST:  Denies coughing, wheezing, or chest  congestion, except as noted above.  CARDIOVASCULAR:  Denies exertional chest  pain, orthopnea, PND, or ankle edema.  He does experience some dyspnea on  exertion but this has improved dramatically.  GASTROINTESTINAL:  Denies  nausea, vomiting, abdominal pain, change in bowel habits, melena, or  hematochezia.  GENITOURINARY:  Denies dysuria, urinary frequency, hesitancy,  or nocturia.  RHEUMATOLOGIC:  Denies back pain or joint pain.  HEMATOLOGIC:  Denies easy bleeding or bruising.  NEUROLOGIC:  Denies history of seizure or  stroke.   PHYSICAL EXAMINATION:  GENERAL:  He is a cheerful, cooperative man who  currently feels well.  HEENT:  Within normal limits.  CHEST:  Clear to auscultation and percussion.  BACK:  Reveals no CVA or point tenderness.  CARDIOVASCULAR:  Reveals a regular rate and rhythm.  There is normal S1, S2.  There are no rubs, murmurs, or gallops.  ABDOMEN:  Within normal limits.  There are no masses or tenderness.  NEUROLOGIC:  Examination of extremities was normal.   LABORATORY DATA:  Today his blood sugar is 68 on arrival to the floor.  His  BNP was 103 on November 18.  At that time his potassium was 3.4, creatinine  was 1, and liver profile was normal.   IMPRESSION:  1. Hypoglycemia, probably secondary to long-acting glipizide with reluctance     to eat lunch.  2. Diabetes, type 2.  3. History of morbid obesity, status post 120 weight loss.  4. Hypertension.  5. Dilated cardiomyopathy.  6. History of idioventricular rhythm.  See above.   PLAN:  I outlined with the patient several possible ways that his problem  could be handled.  I think it is fairly clear that the problem is that the persistent effects of the  glipizide are causing his blood sugar to drop late  in the morning.  As a result of his massive weight loss he has become much  more insulin sensitive.  I suggested to him that his blood sugar regulation  could probably be fairly fine tuned by utilizing a combination of Actos and  a single injection of Lantus insulin, but he was reluctant to consider  resuming insulin treatment.  Therefore I suggested that he discontinue  glipizide, continue Actos in a lower dose, and add Prandin prior to meals  because of its  much shorter  duration of action.  I also emphasized to him that he should try if at all  possible to divide his calories during the day into more equal allotments  and try to eat some lunch.  Will follow him closely and hopefully if he  remains stable, he will be a candidate for discharge tomorrow.                                               Jerelene Redden, MD    SY/MEDQ  D:  07/17/2003  T:  07/17/2003  Job:  [id number]   cc:   Judithann Sauger, M.D.  [address]  [place]  [place] [address]  Fax: [phone]   Peter M. Martinique, M.D.  [address] [place], [place] [address]  Fax: [phone]

## 2011-01-12 NOTE — Discharge Summary (Signed)
NAME:  [name], [name]                          ACCOUNT NO.:  [id number]   MEDICAL RECORD NO.:  [id number]                   PATIENT TYPE:  INP   LOCATION:  [id number]                                 FACILITY:  [facility]   PHYSICIAN:  Jerelene Redden, MD                   DATE OF BIRTH:  07-18-1961   DATE OF ADMISSION:  07/17/2003  DATE OF DISCHARGE:  07/18/2003                                 DISCHARGE SUMMARY   HISTORY OF PRESENT ILLNESS:  [name] is a very pleasant gentleman with  diabetes and a dilated cardiomyopathy who over the last year has lost 130  pounds in weight.  As a result, he has developed much better exercise  tolerance and his sleep apnea symptoms have resolved.  In the last two to  three weeks he has had frequent hypoglycemic reactions, especially while he  is at work.  Often the episodes at work are associated with skipping lunch.  When episodes occur he will measure his blood sugar and find that it is  usually in the range of 40 to 60.  He will then eat a snack which will help  bring his blood sugar up.  On the day of admission, the patient had a  documented blood sugar of 20, and presented to the [facility]  where he saw Dr. Tamala Julian.  She felt that in light of the fact that his blood  sugars remained low in spite of conservative treatment, that it would be  prudent to admit him to the hospital for observation.  At the time of  admission he felt well.  He denied headache, shortness of breath, chest  pain, nausea, vomiting, or abdominal pain.   ADMISSION PHYSICAL EXAMINATION:  GENERAL:  A cheerful cooperative man who  was in no acute distress.  HEENT:  Within normal limits.  CHEST:  Clear.  CARDIOVASCULAR:  Normal S1 and S2 without murmurs, rubs, or gallops.  ABDOMEN:  Benign.  There were normal bowel sounds without masses,  tenderness, or organomegaly.  NEUROLOGIC:  Normal.  EXTREMITIES:  Normal.   Subsequently, additional studies were done.  These included  an A1C  hemoglobin which was 4.9, again confirming that he is relatively  hypoglycemic.  His BNP was 35.  TSH was normal.  CMP revealed a potassium of  4.4, creatinine of 1.1, norma liver profile.  CBC revealed a hemoglobin of  14.7.  The patient's diabetes medications were withheld and he was monitored  very closely.  His blood sugars remained 90 to 120.  Therefore, on July 18, 2003, he was discharged.  At the time of discharge, we discussed several  different possible approaches to his subsequent diabetes treatment.  I think  his main problem had been that he was taking Glipizide ER 10 mg twice daily,  and this was having a prolonged hypoglycemic effect which carried over into  late  into the afternoon causing his blood sugars to drop.  I therefore  suggested that he decrease the dose of Actos to 30 mg daily, stop the  Glipizide, and instead take Prandin 1 mg before meals.  This should have a  much shorter duration of hypoglycemic effect.  Otherwise, I advised him to  take Lanoxin 0.25 mg daily, Lasix 40 mg daily, lisinopril 40 mg daily, Coreg  25 mg b.i.d., and potassium  20 mEq daily.  I advised him to continue to follow up on a regular basis  with Dr. Carol Ada.   DISCHARGE DIAGNOSES:  1. Prolonged hypoglycemia secondary to Glipizide.  2. Type 2 diabetes.  3. Marked obesity with a recent 120 pound weight loss.  4. Hypertension.  5. Dilated cardiomyopathy with an ejection fraction of 20%.  6. History of idioventricular rhythm.   CONDITION ON DISCHARGE:  Good.                                               Jerelene Redden, MD    SY/MEDQ  D:  07/18/2003  T:  07/18/2003  Job:  [id number]   cc:   Judithann Sauger, M.D.  [address] [place]  [place] [address]  Fax: [phone]

## 2011-01-12 NOTE — Consult Note (Signed)
NAME:  [name], [name]                          ACCOUNT NO.:  [id number]   MEDICAL RECORD NO.:  [id number]                   PATIENT TYPE:  EMS   LOCATION:  ED                                   FACILITY:  [facility]   PHYSICIAN:  Peter M. Martinique, M.D.               DATE OF BIRTH:  1960/11/13   DATE OF CONSULTATION:  07/15/2003  DATE OF DISCHARGE:                                   CONSULTATION   HISTORY OF PRESENT ILLNESS:  [name] is a 50 year old black male with  history of nonischemic cardiomyopathy who presents to the emergency room  last night with complaints of abrupt onset of shortness of breath about 12  midnight when he was engaged in sexual activity.  He felt his heart beating  a little bit funny.  He denied any dizziness, syncope, or chest pain.  He  has no known history of arrhythmia.  He came to the emergency room where  chest x-ray was consistent with mild congestive heart failure.  He was given  IV Lasix with excellent diuresis and resolution of his shortness of breath.  His monitor has shown normal sinus rhythm with unifocal PVCs.  He has also  had runs of accelerated idioventricular rhythm with left bundle branch block  morphology and rate of 70.  He has a more rapid rate of 100 with  intermediate QRS morphology, again without evident P-waves consistent with  accelerated junctional rhythm.  He currently feels fine.  It is noteworthy  that he has been poorly compliant with his follow up.  He does report that  he has been taking his medications.  He does have a history of morbid  obesity and reports a weight loss of 130 pounds over the past six months.   PAST MEDICAL HISTORY:  1. Significant for diabetes mellitus type 2.  2. Morbid obesity.  3. Hypertension.  4. Dilated nonischemic cardiomyopathy.   CURRENT MEDICATIONS:  Are recorded as  1. Actos 45 mg per day.  2. Lanoxin 0.25 mg daily.  3. Lasix 40 mg per day.  4. Lisinopril 40 mg per day.  5. Coreg 25 mg b.i.d.  6. Glipizide ER 10 mg b.i.d.   ALLERGIES:  He has no known allergies.   SOCIAL HISTORY:  Denies smoking or alcohol use.  He is married and has  children.   FAMILY HISTORY:  Noncontributory.   REVIEW OF SYSTEMS:  He denies any recent increased edema.  He has had no  cyanosis.  No cough or fever.  All other review of systems are normal.   PHYSICAL EXAMINATION:  Patient is an obese black male in no apparent  distress.  VITAL SIGNS:  Blood pressure 126/91, pulse 69 and normal sinus rhythm, he is  afebrile, saturations are 97%.  HEENT:  Pupils equal, round, and reactive.  Sclerae and conjunctivae are  clear.  Oropharynx is clear.  NECK:  Without  JVD, adenopathy or bruits.  LUNGS:  Clear.  CARDIAC:  Regular rate and rhythm without murmurs, rubs, or gallops or  clicks.  ABDOMEN:  Obese, soft, and nontender.  Bowel sounds are positive.  EXTREMITIES:  Without edema.  Pulses are 2+ and symmetric.  NEUROLOGIC:  Intact.   LABORATORY DATA:  ECG demonstrates normal sinus rhythm, left axis deviation,  nonspecific intraventricular conduction delay.   Chest x-ray shows cardiomegaly with mild CHF.   White count is 9,600, hemoglobin 15.1, hematocrit 45.0, platelets 249,000.  Sodium 135, potassium 3.4, chloride 107, CO2 24, BUN 18, creatinine 1.0,  glucose of 104.  B-natriuretic peptide level is 103.  CK is 346 with an MB  of 3.0, troponin 0.04.  Lanoxin is less than 0.2.   IMPRESSION:  1. Nonischemic cardiomyopathy with mild congestive heart failure, improved.  2. Hypokalemia.  3. Episodes of accelerated idioventricular and accelerated junctional     rhythm.  4. Diabetes mellitus.  5. Morbid obesity.  6. Noncompliance.   PLAN:  Resume all his prior medications.  Will replete his potassium with 20  mEq p.o. daily.  Will discharge from the emergency room and have patient  follow up in our office for 24-hour Holter monitor and then an  echocardiogram with Doppler.  May need to consider an  aldosterone  antagonist.                                               Peter M. Martinique, M.D.    PMJ/MEDQ  D:  07/15/2003  T:  07/15/2003  Job:  [id number]   cc:   Nunzio Cory A. Tamala Julian, M.D.  [address]  [place]  [place] [address]  Fax: [phone]

## 2011-01-12 NOTE — H&P (Signed)
[place]  Patient:    [name], [name]                       MRN: [id number] Adm. Date:  [id number] Attending:  Court Joy Dictator:   Court Joy, M.D. CC:         Room 2012             Audree Camel. Hurman Horn., M.D.             Peter M. Martinique, M.D.                         History and Physical  DATE OF BIRTH:  02/03/1961  PROBLEM LIST: 1. Newly diagnosed type 2 diabetes mellitus, uncontrolled.  CBG 581,    hemoglobin A1C 10.5%. 2. Acute renal failure, prerenal azotemia. 3. Severe dehydration. 4. Dilated cardiomyopathy, idiopathic, hypertensive.  A 2-D echocardiogram in    3/01, ejection fraction 20 to 25%, four chamber dilation, left ventricular    hypertrophy, mild mitral regurgitation.  Cardiac catheterization in 5/99,    normal coronary arteries, ejection fraction 25 to 30%. 5. Hypertension. 6. Obesity.  CHIEF COMPLAINT:  Blurred vision, generalized weakness, nocturia, polyuria, polydipsia.  HISTORY OF PRESENT ILLNESS:  Mr. Stano is a very pleasant 50 year old African-American male who presents with at least nine days of polyuria, polydipsia, nocturia, blurred vision, weight loss (unknown number of pounds), generalized weakness, confusion, and slight lethargy.  He was seen in [place] yesterday, 12/18/99, with a CBG of 642.  He was given 10 units of regular insulin subcutaneously, and he was started on Glucophage XR 500 mg once a day.  The laboratory data obtained during the visit showed results consistent with uncontrolled type 2 diabetes mellitus associated with prerenal azotemia.  The patient denies dyspnea on exertion compared to baseline.  No chest pain, no orthopnea, no cough.  He feels intermittently hot, though this has been a normal pattern for many years.  No chills.  Occasional night sweats.  No melena, tarry stools, bright red blood per rectum.  He describes the nocturia (at least 8 to 10 times  per night).  No focal weakness.  He reports blurred vision.  Nonproductive cough.  No dysuria.  He describes some soft to watery diarrhea for the last nine days.  He reports about one loose bowel movement a day.  No abdominal cramping or abdominal symptoms.  No skin rash.  No lower extremity swelling.  No leg pain.  No headaches, no swallowing problems.  No rhinorrhea, no postnasal drip, no shortness of breath, no paroxysmal nocturnal dyspnea.  PAST MEDICAL HISTORY:  As HPI.  ALLERGIES:  No known drug allergies.  CURRENT MEDICATIONS: 1. Coreg 25 mg p.o. b.i.d. 2. Lasix 200 mg p.o. q.d. 3. Prinivil 40 mg p.o. q.d. 4. Enteric-coated aspirin one tablet p.o. q.d. 5. Lanoxin 0.25 mg p.o. q.d. 6. Glucophage XR 500 mg p.o. q.d., started on 12/18/99.  SOCIAL HISTORY:  The patient is married.  He has a daughter, age 65.  He denies smoking.  He drinks occasional beers.  He works for the [employer]  FAMILY HISTORY:  Significant for diabetes (father, mother, grandparents on both sides all have diabetes), hypertension (father), strokes (mother), coronary artery disease (mother died at the age of 42 years old with an acute myocardial infarction), his mother also had lupus.  The patients father had prostatic CA.  REVIEW  OF SYSTEMS:  As HPI.  PHYSICAL EXAMINATION:  VITAL SIGNS:  Temperature 97.2, blood pressure 120/60, orthostatic blood pressures pending, heart rate 55, respirations 20, oxygen saturation 94% on room air.  HEENT:  Normocephalic, atraumatic.  Nonicteric sclera.  Conjunctivae within normal limits.  PERRLA.  EOMI.  Funduscopic examination negative for papal edema or hemorrhages.  Tympanic membranes within normal limits.  Very dry mucus membranes.  Oropharynx clear.  NECK:  Supple, no JVD, no bruits, no adenopathy, no thyromegaly.  LUNGS:  Clear to auscultation bilaterally without crackles, wheezes, good air movement bilaterally.  CARDIAC:  Regular rate and rhythm,  slightly bradycardic, 1 to 2/6 systolic ejection murmur at the left lower sternal border.  S4 is present, no S3, no rub.  ABDOMEN:  Obese, nontender, nondistended, bowel sounds were present, no hepatosplenomegaly.  No rebound, no guarding, no masses, no bruits.  GASTROURINARY:  Within normal limits.  RECTAL:  Empty vault, normal sphincter tone, prostatic size within normal limits and without nodules.  EXTREMITIES:  No clubbing, cyanosis, or edema.  Pulses are 1+ bilaterally.  NEUROLOGIC:  Alert and oriented x 3, strength 5/5 in all extremities.  Deep tendon reflexes 3/5 in all extremities.  Cranial nerves II-XII grossly intact except some blurred vision, though no visual field cut.  Sensory intact. Plantar reflexes downgoing bilaterally.  LABORATORY DATA:  Pending.  Laboratory data obtained on 12/18/99 significant for BUN of 16, creatinine 1.6, sodium 125, chloride 83, hemoglobin A1C 10.5, glucose 642, bicarbonate 26, LFTs within normal limits.  Total protein 8.5.  ASSESSMENT AND PLAN: 1. Newly diagnosed type 2 diabetes mellitus, uncontrolled.  The patient    describes the classic signs of uncontrolled diabetes.  This patient has    type 2 diabetes mellitus which is very common in his family.  At this    point, there is no definitive signs of hyperosmolar state.  Obviously,    there is no DKA because the patient has type 2 diabetes mellitus.  We will    go ahead and admit the patient to a telemetry bed to receive intravenous    hydration therapy, sliding scale insulin subcutaneously, and oral    hypoglycemic agents.  We will go ahead and start diabetic teaching, and we    will obtain a diabetic referral during this hospital stay to be followed as    an outpatient.  Agents like Glucophage are contraindicated in this case    because of the history of cardiomyopathy with decreased ejection fraction.    An urinalysis will be obtained to rule out proteinuria.  We will start     Actos  and Amaryl.  The patient will require a full ophthalmologic    evaluation as an outpatient.  We will refer this patient to    Dr. Zadie Rhine.  The cardiac enzymes and EKG will be obtained for a completion    to confirm that there is no evidence of cardiac ischemia. 2. Acute renal failure, prerenal azotemia.  The patients baseline creatinine    is 1.1 with a BUN of 11.  This laboratory data was obtained about two    months ago.  The elevated creatinine and BUN are consistent with severe    dehydration and prerenal azotemia.  We will go ahead and start the patient    on supportive care on intravenous fluids.  For now we will hold the ACE    inhibitor, digoxin, Lasix, and aspirin, to avoid further worsening of the    patients renal  function.  We will repeat a BMET in the morning to follow    the patients renal function. 3. Severe dehydration.  The physical examination is consistent with moderate    to severe range of dehydration.  The orthostatic blood pressures are    pending.  Intravenous hydration therapy will be started at 200 cc an hour.    The fluid pounds will be followed up closely, as well as the patients    weight and orthostatic blood pressures.  Special monitoring should be    focused on the cardiac respiratory state, given the patients    cardiomyopathy.  The intravenous fluid rate will be decreased after the    first liter, and then tapered off slowly until the signs of dehydration    resolve. 4. Dilated cardiomyopathy.  No evidence of congestive heart failure was    noticed.  The EKG and cardiac enzymes are pending.  As described in problem    #2, the medications being used as an outpatient like Lasix, digoxin, and    the ACE inhibitor will be placed on hold for now.  Once the creatinine    starts to improve, we will resume one of the agents at a time.  The digoxin    level will be checked today to rule out digoxin toxicity.  For now, we will    decrease the beta blocker to 1/2  dose until the rehydration and the renal    function improve. Dr. Martinique will be notified of this admission so he can    stop by socially if convenient. DD:  12/19/99 TD:  12/19/99 Job: [id number]

## 2011-01-12 NOTE — Procedures (Signed)
NAME:  [name], [name]                ACCOUNT NO.:  [id number]   MEDICAL RECORD NO.:  [id number]          PATIENT TYPE:  OUT   LOCATION:  SLEEP CENTER                 FACILITY[facility]  [facility]   PHYSICIAN:  Clinton D. Annamaria Boots, M.D. DATE OF BIRTH:  [birth year]   DATE OF STUDY:                              NOCTURNAL POLYSOMNOGRAM   REFERRING PHYSICIAN:  Dr. Peter Martinique.   DATE OF STUDY:  July 31, 2005.   INDICATION FOR STUDY:  Hypersomnia with sleep apnea.   EPWORTH SLEEPINESS SCORE:  15/24.   BMI:  44.8.   WEIGHT:  400 pounds.   RESPIRATORY DATA:  Split study protocol:  AHI 54.1 obstructive events per  hour indicating severe obstructive sleep apnea/hypopnea syndrome before C-  PAP. This included 80 obstructive apneas and 54 hypopneas before C-PAP.  Events were not positional. REM AHI 29.5 per hour. C-PAP was titrated to a  18 CWP, AHI 0 per hour. A large ResMed ultra mirage full face mask was used  with heated humidifier.   OXYGEN DATA:  Moderate to loud snoring with oxygen desaturation to a nadir  of 80% before C-PAP. After C-PAP control, saturation held 93-96% on room  air.   CARDIAC DATA:  Sinus rhythm with very frequent PVCs including triplets.   MOVEMENT/PARASOMNIA:  Occasional leg jerks with insignificant effect on  sleep.   IMPRESSION/RECOMMENDATIONS:  1.  Severe obstructive sleep apnea/hypopnea syndrome, AHI 54.1 per hour with      moderate to loud snoring and oxygen desaturation to 80%.  2.  Successful C-PAP titration to 18 CWP, AHI 0 per hour. A large ResMed      ultra mirage full face mask was used      with heated humidifier.  3.  Frequent ventricular ectopy including triplets.      Clinton D. Annamaria Boots, M.D.  Diplomate, [employer] of Sleep Medicine  Electronically Signed     CDY/MEDQ  D:  08/05/2005 11:18:31  T:  08/05/2005 23:41:02  Job:  [id number]

## 2011-01-12 NOTE — Assessment & Plan Note (Signed)
[facility]                             PULMONARY OFFICE NOTE   AALIYAH, SCHARPF                       MRN:          [id number]  DATE:09/17/2006                            DOB:          1960/12/16    PROBLEMS:  1. Obstructive sleep apnea with hypersomnolence.  2. Exogenous obesity.  3. Dilated cardiomyopathy.  4. Diabetes type-2.   HISTORY:  [name] comes anticipating lap band surgery for weight loss  with Dr. Johnathan Hausen. He says his breathing is fine with no cough,  wheeze, chest pain, or palpitation. There is no leg edema. He loves his  CPAP machine and says he is fully used to it and does not snore. He  denies daytime sleepiness and is comfortable continuing CPAP every night  at 18 CWP through [place].   OBJECTIVE:  He reports his weight at 440 pounds, blood pressure 110/76,  pulse 71, room air saturation 95%. There is no neck vein distension.  This is a very big and tall man. He is fully alert. There are no  pressure marks on his face from the mask. His nose is not congested.  Chest is clear. Heart sounds regular and normal.   IMPRESSION:  1. Obstructive sleep apnea on good control.  2. Cardiomyopathy with systolic dysfunction.  3. Obesity.   PLAN:  1. He is clear for proposed surgery, recognizing increased risks      automatically, a follow from his weight.  2. We will send records to Dr. Hassell Done.  3. Schedule return 1 year, earlier p.r.n.     Clinton D. Annamaria Boots, MD, [facility] Flood, FACP  Electronically Signed    CDY/MedQ  DD: 10/18/2006  DT: 10/19/2006  Job #: [id number]   cc:   Judithann Sauger, M.D.  Peter M. Martinique, M.D.

## 2011-05-03 ENCOUNTER — Encounter: Payer: Self-pay | Admitting: *Deleted

## 2011-06-14 LAB — DIFFERENTIAL
Basophils Absolute: 0
Basophils Relative: 0
Eosinophils Absolute: 0.1
Monocytes Absolute: 0.8 — ABNORMAL HIGH
Monocytes Relative: 8
Neutrophils Relative %: 78 — ABNORMAL HIGH

## 2011-06-14 LAB — CBC
Hemoglobin: 13.2
MCHC: 33.9
MCV: 85.2
RBC: 4.59
RDW: 15.1 — ABNORMAL HIGH

## 2011-07-10 ENCOUNTER — Encounter: Payer: Self-pay | Admitting: Internal Medicine

## 2011-07-11 ENCOUNTER — Encounter (INDEPENDENT_AMBULATORY_CARE_PROVIDER_SITE_OTHER): Payer: Self-pay | Admitting: Surgery

## 2011-07-12 ENCOUNTER — Encounter: Payer: Self-pay | Admitting: Internal Medicine

## 2011-07-12 ENCOUNTER — Ambulatory Visit (INDEPENDENT_AMBULATORY_CARE_PROVIDER_SITE_OTHER): Payer: Self-pay | Admitting: Internal Medicine

## 2011-07-12 DIAGNOSIS — I428 Other cardiomyopathies: Secondary | ICD-10-CM

## 2011-07-12 DIAGNOSIS — I4891 Unspecified atrial fibrillation: Secondary | ICD-10-CM

## 2011-07-12 DIAGNOSIS — I509 Heart failure, unspecified: Secondary | ICD-10-CM

## 2011-07-12 DIAGNOSIS — I472 Ventricular tachycardia: Secondary | ICD-10-CM

## 2011-07-12 LAB — ICD DEVICE OBSERVATION
AL IMPEDENCE ICD: 475 Ohm
ATRIAL PACING ICD: 94.66 pct
LV LEAD IMPEDENCE ICD: 323 Ohm
LV LEAD THRESHOLD: 1.5 V
TOT-0001: 4
TOT-0002: 1
TOT-0006: 20110111000000
TZAT-0001ATACH: 2
TZAT-0001ATACH: 3
TZAT-0001FASTVT: 1
TZAT-0001SLOWVT: 1
TZAT-0002ATACH: NEGATIVE
TZAT-0002FASTVT: NEGATIVE
TZAT-0012ATACH: 150 ms
TZAT-0012ATACH: 150 ms
TZAT-0018ATACH: NEGATIVE
TZAT-0018ATACH: NEGATIVE
TZAT-0018SLOWVT: NEGATIVE
TZAT-0019ATACH: 6 V
TZAT-0019FASTVT: 8 V
TZAT-0019SLOWVT: 8 V
TZAT-0020ATACH: 1.5 ms
TZON-0003VSLOWVT: 340 ms
TZON-0004SLOWVT: 16
TZON-0004VSLOWVT: 20
TZON-0005SLOWVT: 12
TZST-0001ATACH: 6
TZST-0001FASTVT: 3
TZST-0001SLOWVT: 2
TZST-0001SLOWVT: 4
TZST-0001SLOWVT: 6
TZST-0002ATACH: NEGATIVE
TZST-0002ATACH: NEGATIVE
TZST-0002FASTVT: NEGATIVE
TZST-0002FASTVT: NEGATIVE
TZST-0002FASTVT: NEGATIVE
TZST-0002SLOWVT: NEGATIVE
TZST-0002SLOWVT: NEGATIVE
VENTRICULAR PACING ICD: 99.47 pct

## 2011-07-12 NOTE — Assessment & Plan Note (Signed)
Stable NYHA Class II/III chronic systolic dysfunction. Stable No changes today

## 2011-07-12 NOTE — Progress Notes (Signed)
The patient presents today for routine electrophysiology followup.  Since last being seen in our clinic, the patient reports doing very well.  He is followed in the advanced heart failure clinic at [facility].  He is awaiting transplant.  He remains active.  He has 2-3 pillow orthopnea and occasional edema. Today, he denies symptoms of palpitations, chest pain, shortness of breath,dizziness, presyncope, syncope, or neurologic sequela.  The patient feels that he is tolerating medications without difficulties and is otherwise without complaint today.   Past Medical History  Diagnosis Date  . Chronic systolic dysfunction of left ventricle   . Diabetes mellitus   . Hypertension   . CHF (congestive heart failure), NYHA class III     s/p BiV ICD and LVAD  . Left bundle branch block   . Atrial fibrillation   . Premature ventricular contractions   . Hyperlipidemia   . Morbid obesity     status post lap band  . Family history of coronary artery disease     in both parents  . Obstructive sleep apnea   . Nonischemic cardiomyopathy     Est. EF of 15% to 20%, s/p BiV ICD and LVAD, on [facility]plant list  . CVA (cerebral infarction)   . Stroke   . SOB (shortness of breath)    Past Surgical History  Procedure Date  . Cardiac pacemaker placement 09/21/2009    Biventricular implantable cardioverter-defibrillator implantation     . Laparoscopic gastric banding 01/27/2007  . Foot surgery     left  . Left ventricular assist device     implanted at [facility]    Current Outpatient Prescriptions  Medication Sig Dispense Refill  . amiodarone (PACERONE) 200 MG tablet       . atorvastatin (LIPITOR) 10 MG tablet       . carvedilol (COREG) 3.125 MG tablet       . furosemide (LASIX) 80 MG tablet Take 1 tablet (80 mg total) by mouth 2 (two) times daily.  60 tablet  5  . KLOR-CON 10 10 MEQ CR tablet       . omeprazole (PRILOSEC) 20 MG capsule       . warfarin (COUMADIN) 5 MG tablet As Directed        No Known  Allergies  History   Social History  . Marital Status: Married    Spouse Name: N/A    Number of Children: N/A  . Years of Education: N/A   Occupational History  . Not on file.   Social History Main Topics  . Smoking status: Former Smoker    Types: Cigars  . Smokeless tobacco: Not on file  . Alcohol Use: No  . Drug Use: No  . Sexually Active: Not on file   Other Topics Concern  . Not on file   Social History Narrative  . No narrative on file    Family History  Problem Relation Age of Onset  . Coronary artery disease Father     had, PTCA & CABG  . Heart attack Father   . Hypertension Father   . Diabetes Father   . Coronary artery disease Mother   . Heart attack Mother   . Hypertension Mother   . Stroke Mother   . Lupus Mother     ROS-  All systems are reviewed and are negative except as outlined in the HPI above   Physical Exam: Filed Vitals:   07/12/11 0938  BP: 98/0  Height: 6\' 8"  (2.032 m)  Weight: 334 lb 6.4 oz (151.683 kg)    GEN- The patient is well appearing, alert and oriented x 3 today.   Head- normocephalic, atraumatic Eyes-  Sclera clear, conjunctiva pink Ears- hearing intact Oropharynx- clear Neck- supple, no JVP Lymph- no cervical lymphadenopathy Lungs- Clear to ausculation bilaterally, normal work of breathing Chest- ICD pocket is well healed Heart- Regular rate and rhythm,  lVAD hum appreciated GI- soft, NT, ND, + BS Extremities- no clubbing, cyanosis, 1+ edema MS- no significant deformity or atrophy Skin- no rash or lesion Psych- euthymic mood, full affect Neuro- strength and sensation are intact  ICD interrogation- reviewed in detail today,  See PACEART report  Assessment and Plan:

## 2011-07-12 NOTE — Patient Instructions (Addendum)
Your physician wants you to follow-up in: 12 months with Dr Vallery Ridge will receive a reminder letter in the mail two months in advance. If you don't receive a letter, please call our office to schedule the follow-up appointment.  Remote monitoring is used to monitor your Pacemaker of ICD from home. This monitoring reduces the number of office visits required to check your device to one time per year. It allows [place] to keep an eye on the functioning of your device to ensure it is working properly. You are scheduled for a device check from home on 10/11/2011. You may send your transmission at any time that day. If you have a wireless device, the transmission will be sent automatically. After your physician reviews your transmission, you will receive a postcard with your next transmission date.  Your physician recommends that you return for lab work in: TSH/Free T4

## 2011-07-12 NOTE — Assessment & Plan Note (Signed)
No further episodes Normal BiV ICD function See Pace Art report No changes today  Continue amiodarone and coreg,  LFTs/TFTs followed at [facility] per pt.

## 2011-07-13 ENCOUNTER — Ambulatory Visit (INDEPENDENT_AMBULATORY_CARE_PROVIDER_SITE_OTHER): Payer: 59 | Admitting: Physician Assistant

## 2011-07-13 ENCOUNTER — Encounter (INDEPENDENT_AMBULATORY_CARE_PROVIDER_SITE_OTHER): Payer: Self-pay

## 2011-07-13 VITALS — HR 60 | Temp 97.7°F | Resp 14 | Ht 79.0 in | Wt 327.4 lb

## 2011-07-13 DIAGNOSIS — Z4651 Encounter for fitting and adjustment of gastric lap band: Secondary | ICD-10-CM

## 2011-07-13 NOTE — Patient Instructions (Signed)
Take nutritious foods and avoid 'slider' foods that are high in calories, carbohydrate and fat. Follow-up with Dr. Hassell Done in the next 2-3 months. Return if you continue to have reflux symptoms despite taking Prilosec.

## 2011-07-13 NOTE — Progress Notes (Signed)
  HISTORY: [name] is a 50 y.o.male who received an AP-Large lap-band in June 2008 by Dr. Hassell Done. He's continuing with his LVAD device placed at [facility] by Dr. Norm Parcel and is awaiting a transplanted heart. He's gained around 80 lbs since last being seen 23 months ago and his weight gain is troubling him. Unfortunately he's also having reflux at least twice a week despite daily prilosec. He says his portion sizes are small but admits to eating slider foods.  VITAL SIGNS: Filed Vitals:   07/13/11 0931  Pulse: 60  Temp: 97.7 F (36.5 C)  Resp: 14    PHYSICAL EXAM: Physical exam reveals a very well-appearing 50 y.o.male in no apparent distress Neurologic: Awake, alert, oriented Psych: Bright affect, conversant Respiratory: Breathing even and unlabored. No stridor or wheezing Abdomen: Soft, nontender, nondistended to palpation. Incisions well-healed. No incisional hernias. Port easily palpated. Extremities: Atraumatic, good range of motion.  ASSESMENT: 50 y.o.  male  s/p AP-Large lap-band.   PLAN: The patient's port was accessed with a 20G Huber needle without difficulty. Clear fluid was aspirated and 0.25 mL saline was removed. As it's been practically two years since last being seen by Dr. Hassell Done, I asked that the patient see Dr. Hassell Done in the next 2-3 months.

## 2011-09-07 ENCOUNTER — Encounter (INDEPENDENT_AMBULATORY_CARE_PROVIDER_SITE_OTHER): Payer: Self-pay | Admitting: Surgery

## 2011-09-07 ENCOUNTER — Ambulatory Visit (INDEPENDENT_AMBULATORY_CARE_PROVIDER_SITE_OTHER): Payer: 59 | Admitting: Surgery

## 2011-09-07 NOTE — Patient Instructions (Signed)
Followup with Jetty Peeks for dietary guidance

## 2011-09-07 NOTE — Progress Notes (Signed)
[name] now has a left ventricular assist device in place and he is on the transplant list for a heart transplant. He is struggling with fluid retention. I think his band does not need to be made any tighter. He's got to watch the carbohydrates that he eats and could go back to the liquid proteins he had around the time of his band surgery. In addition have encouraged him to look at juicing vegetables and taking none process foods to avoid salts.  I will see him again in 2 months. In the meantime he requested to see [name] is she's helped him in the past and hopefully can help him again today on task with his diet.

## 2011-10-11 ENCOUNTER — Encounter: Payer: Self-pay | Admitting: *Deleted

## 2011-10-15 ENCOUNTER — Encounter: Payer: Self-pay | Admitting: *Deleted

## 2011-10-17 ENCOUNTER — Encounter: Payer: Self-pay | Admitting: Internal Medicine

## 2011-10-17 ENCOUNTER — Ambulatory Visit (INDEPENDENT_AMBULATORY_CARE_PROVIDER_SITE_OTHER): Payer: 59 | Admitting: *Deleted

## 2011-10-17 DIAGNOSIS — I4891 Unspecified atrial fibrillation: Secondary | ICD-10-CM

## 2011-10-17 DIAGNOSIS — I472 Ventricular tachycardia: Secondary | ICD-10-CM

## 2011-10-17 DIAGNOSIS — I428 Other cardiomyopathies: Secondary | ICD-10-CM

## 2011-10-19 ENCOUNTER — Encounter (INDEPENDENT_AMBULATORY_CARE_PROVIDER_SITE_OTHER): Payer: 59 | Admitting: Surgery

## 2011-10-19 LAB — REMOTE ICD DEVICE
AL IMPEDENCE ICD: 532 Ohm
BATTERY VOLTAGE: 3.0341 V
BRDY-0003LV: 130 {beats}/min
BRDY-0004LV: 120 {beats}/min
CHARGE TIME: 9.529 s
LV LEAD IMPEDENCE ICD: 380 Ohm
LV LEAD THRESHOLD: 1.25 V
TOT-0001: 4
TOT-0002: 1
TOT-0006: 20110111000000
TZAT-0001ATACH: 1
TZAT-0001ATACH: 2
TZAT-0001FASTVT: 1
TZAT-0001SLOWVT: 1
TZAT-0002FASTVT: NEGATIVE
TZAT-0002SLOWVT: NEGATIVE
TZAT-0012ATACH: 150 ms
TZAT-0012ATACH: 150 ms
TZAT-0012ATACH: 150 ms
TZAT-0012FASTVT: 200 ms
TZAT-0012SLOWVT: 200 ms
TZAT-0018ATACH: NEGATIVE
TZAT-0018ATACH: NEGATIVE
TZAT-0018FASTVT: NEGATIVE
TZAT-0018SLOWVT: NEGATIVE
TZAT-0019ATACH: 6 V
TZAT-0019FASTVT: 8 V
TZAT-0020ATACH: 1.5 ms
TZAT-0020ATACH: 1.5 ms
TZON-0003ATACH: 350 ms
TZON-0003VSLOWVT: 340 ms
TZON-0004VSLOWVT: 20
TZST-0001ATACH: 4
TZST-0001FASTVT: 4
TZST-0001FASTVT: 5
TZST-0001FASTVT: 6
TZST-0001SLOWVT: 2
TZST-0001SLOWVT: 4
TZST-0001SLOWVT: 6
TZST-0002ATACH: NEGATIVE
TZST-0002FASTVT: NEGATIVE
TZST-0002FASTVT: NEGATIVE
TZST-0002FASTVT: NEGATIVE
TZST-0002SLOWVT: NEGATIVE
TZST-0002SLOWVT: NEGATIVE
TZST-0002SLOWVT: NEGATIVE
VENTRICULAR PACING ICD: 99.38 pct

## 2011-10-23 ENCOUNTER — Encounter: Payer: 59 | Attending: Surgery | Admitting: *Deleted

## 2011-10-23 DIAGNOSIS — Z713 Dietary counseling and surveillance: Secondary | ICD-10-CM | POA: Insufficient documentation

## 2011-10-23 NOTE — Progress Notes (Signed)
Medical Nutrition Therapy:  Appt start time: 1100 end time:  1200.  Assessment:  Morbid Obesity.  Pt here 7 yrs s/p LAGB for assessment of morbid obesity. Had band placed in 2004 and reports wt of 240 lbs until 2 years ago when his heart condition deteriorated. LVAT placed and is currently on heart transplant list. Pt states he knows what to do and wants to get back on track and lose weight to decrease stress on heart and to feel better. No pain reported at this time.  MEDICATIONS:  See medication list; reconciled with pt.    DIETARY INTAKE: Usual eating pattern includes 2 meals and 1-2 snacks per day.  24-hr recall: Unable to obtain recall, but reports eating out often with spouse (ex: 6" tuna sub on flatbread).   Usual physical activity: Pt reports walking several days/week and using elliptical or treadmill at gym. Tires after ~20-30 min.  Progress Towards Goal(s):  In progress.   Nutritional Diagnosis:  -3.3 Morbid obesity related to excessive energy intake and decreased activity level evidenced by patient reported diet history and minimal physical activity after LVAT placement.    Intervention/Goals:  Follow Modified Bariatric Surgery Pre-Op diet until next follow up visit.   Keep carbohydrate intake to ~30 grams/meal.  Have lean protein with all meals and snacks.  Eat 3-6 small meals/snacks, every 3-5 hrs.  Consume 80g + of lean protein foods daily.   Increase fluid intake to 64oz + (unless restricted by MD).  Avoid drinking 15 minutes before, during and 30 minutes after eating.  Aim for >30 min of physical activity daily as able (per MD clearance).  Check out www.calorieking.com for food label information.  Handouts given during visit include:  Modified Bariatric Surgery Pre-Op Diet  Bariatric Fast Food Options  30g CHO menu  Monitoring/Evaluation:  Dietary intake, exercise, and body weight in 8 week(s).

## 2011-10-23 NOTE — Patient Instructions (Addendum)
Goals:  Follow Modified Bariatric Surgery Pre-Op diet until next follow up visit.   Keep carbohydrate intake to 30 grams/meal (not 45g as previously discussed).  Have lean protein with all meals and snacks.  Eat 3-6 small meals/snacks, every 3-5 hrs.  Consume 80g + of lean protein foods daily.   Increase fluid intake to 64oz + (unless restricted by MD).  Avoid drinking 15 minutes before, during and 30 minutes after eating.  Aim for >30 min of physical activity daily as able (per MD clearance).  Check out www.calorieking.com for food label information.

## 2011-10-24 ENCOUNTER — Encounter: Payer: Self-pay | Admitting: *Deleted

## 2011-10-25 NOTE — Progress Notes (Signed)
Remote icd w/icm

## 2011-11-02 ENCOUNTER — Encounter: Payer: Self-pay | Admitting: *Deleted

## 2011-11-30 ENCOUNTER — Other Ambulatory Visit: Payer: Self-pay | Admitting: Cardiology

## 2011-12-11 ENCOUNTER — Encounter (INDEPENDENT_AMBULATORY_CARE_PROVIDER_SITE_OTHER): Payer: Self-pay | Admitting: Surgery

## 2011-12-12 DIAGNOSIS — Z7901 Long term (current) use of anticoagulants: Secondary | ICD-10-CM | POA: Diagnosis not present

## 2011-12-12 DIAGNOSIS — Z95811 Presence of heart assist device: Secondary | ICD-10-CM | POA: Diagnosis not present

## 2011-12-19 ENCOUNTER — Encounter (INDEPENDENT_AMBULATORY_CARE_PROVIDER_SITE_OTHER): Payer: 59 | Admitting: Surgery

## 2011-12-19 ENCOUNTER — Ambulatory Visit: Payer: 59 | Admitting: *Deleted

## 2012-01-08 DIAGNOSIS — Z95811 Presence of heart assist device: Secondary | ICD-10-CM | POA: Diagnosis not present

## 2012-01-08 DIAGNOSIS — Z7901 Long term (current) use of anticoagulants: Secondary | ICD-10-CM | POA: Diagnosis not present

## 2012-01-17 ENCOUNTER — Encounter: Payer: Self-pay | Admitting: *Deleted

## 2012-01-18 ENCOUNTER — Encounter: Payer: Self-pay | Admitting: *Deleted

## 2012-01-22 DIAGNOSIS — I509 Heart failure, unspecified: Secondary | ICD-10-CM | POA: Diagnosis not present

## 2012-01-22 DIAGNOSIS — Z95811 Presence of heart assist device: Secondary | ICD-10-CM | POA: Diagnosis not present

## 2012-01-29 ENCOUNTER — Encounter: Payer: Self-pay | Admitting: Internal Medicine

## 2012-01-29 ENCOUNTER — Ambulatory Visit (INDEPENDENT_AMBULATORY_CARE_PROVIDER_SITE_OTHER): Payer: 59 | Admitting: *Deleted

## 2012-01-29 DIAGNOSIS — I472 Ventricular tachycardia: Secondary | ICD-10-CM

## 2012-01-29 DIAGNOSIS — I4729 Other ventricular tachycardia: Secondary | ICD-10-CM

## 2012-02-07 LAB — REMOTE ICD DEVICE
AL AMPLITUDE: 6 mv
ATRIAL PACING ICD: 98.03 pct
BATTERY VOLTAGE: 3.0068 V
BRDY-0002LV: 80 {beats}/min
BRDY-0004LV: 120 {beats}/min
FVT: 0
LV LEAD THRESHOLD: 1.25 V
TZAT-0001ATACH: 1
TZAT-0001ATACH: 2
TZAT-0001ATACH: 3
TZAT-0002ATACH: NEGATIVE
TZAT-0012ATACH: 150 ms
TZAT-0018ATACH: NEGATIVE
TZAT-0018ATACH: NEGATIVE
TZAT-0018ATACH: NEGATIVE
TZAT-0018SLOWVT: NEGATIVE
TZAT-0019ATACH: 6 V
TZAT-0019ATACH: 6 V
TZAT-0019FASTVT: 8 V
TZAT-0019SLOWVT: 8 V
TZAT-0020FASTVT: 1.5 ms
TZAT-0020SLOWVT: 1.5 ms
TZON-0003SLOWVT: 360 ms
TZON-0003VSLOWVT: 340 ms
TZON-0004SLOWVT: 16
TZON-0004VSLOWVT: 20
TZON-0005SLOWVT: 12
TZST-0001ATACH: 5
TZST-0001ATACH: 6
TZST-0001FASTVT: 3
TZST-0001FASTVT: 5
TZST-0001SLOWVT: 2
TZST-0001SLOWVT: 3
TZST-0001SLOWVT: 6
TZST-0002ATACH: NEGATIVE
TZST-0002ATACH: NEGATIVE
TZST-0002FASTVT: NEGATIVE
TZST-0002FASTVT: NEGATIVE
TZST-0002FASTVT: NEGATIVE
TZST-0002SLOWVT: NEGATIVE
TZST-0002SLOWVT: NEGATIVE
TZST-0002SLOWVT: NEGATIVE
VENTRICULAR PACING ICD: 99.61 pct
VF: 0

## 2012-02-19 ENCOUNTER — Encounter: Payer: Self-pay | Admitting: *Deleted

## 2012-02-25 DIAGNOSIS — Z95811 Presence of heart assist device: Secondary | ICD-10-CM | POA: Diagnosis not present

## 2012-02-25 DIAGNOSIS — Z7901 Long term (current) use of anticoagulants: Secondary | ICD-10-CM | POA: Diagnosis not present

## 2012-03-11 DIAGNOSIS — I5022 Chronic systolic (congestive) heart failure: Secondary | ICD-10-CM | POA: Diagnosis not present

## 2012-03-11 DIAGNOSIS — Z95818 Presence of other cardiac implants and grafts: Secondary | ICD-10-CM | POA: Diagnosis not present

## 2012-03-11 DIAGNOSIS — I428 Other cardiomyopathies: Secondary | ICD-10-CM | POA: Diagnosis not present

## 2012-03-11 DIAGNOSIS — Z7901 Long term (current) use of anticoagulants: Secondary | ICD-10-CM | POA: Diagnosis not present

## 2012-03-26 DIAGNOSIS — M545 Low back pain: Secondary | ICD-10-CM | POA: Diagnosis not present

## 2012-05-05 DIAGNOSIS — T82897A Other specified complication of cardiac prosthetic devices, implants and grafts, initial encounter: Secondary | ICD-10-CM | POA: Diagnosis not present

## 2012-05-05 DIAGNOSIS — I509 Heart failure, unspecified: Secondary | ICD-10-CM | POA: Diagnosis present

## 2012-05-05 DIAGNOSIS — D680 Von Willebrand's disease: Secondary | ICD-10-CM | POA: Diagnosis not present

## 2012-05-05 DIAGNOSIS — I472 Ventricular tachycardia: Secondary | ICD-10-CM | POA: Diagnosis present

## 2012-05-05 DIAGNOSIS — Y838 Other surgical procedures as the cause of abnormal reaction of the patient, or of later complication, without mention of misadventure at the time of the procedure: Secondary | ICD-10-CM | POA: Diagnosis not present

## 2012-05-05 DIAGNOSIS — F411 Generalized anxiety disorder: Secondary | ICD-10-CM | POA: Diagnosis present

## 2012-05-05 DIAGNOSIS — I517 Cardiomegaly: Secondary | ICD-10-CM | POA: Diagnosis not present

## 2012-05-05 DIAGNOSIS — F329 Major depressive disorder, single episode, unspecified: Secondary | ICD-10-CM | POA: Diagnosis present

## 2012-05-05 DIAGNOSIS — Z95811 Presence of heart assist device: Secondary | ICD-10-CM | POA: Diagnosis not present

## 2012-05-05 DIAGNOSIS — T827XXA Infection and inflammatory reaction due to other cardiac and vascular devices, implants and grafts, initial encounter: Secondary | ICD-10-CM | POA: Diagnosis not present

## 2012-05-05 DIAGNOSIS — Z7982 Long term (current) use of aspirin: Secondary | ICD-10-CM | POA: Diagnosis not present

## 2012-05-05 DIAGNOSIS — I428 Other cardiomyopathies: Secondary | ICD-10-CM | POA: Diagnosis present

## 2012-05-05 DIAGNOSIS — I10 Essential (primary) hypertension: Secondary | ICD-10-CM | POA: Diagnosis present

## 2012-05-05 DIAGNOSIS — E119 Type 2 diabetes mellitus without complications: Secondary | ICD-10-CM | POA: Diagnosis present

## 2012-05-05 DIAGNOSIS — Z452 Encounter for adjustment and management of vascular access device: Secondary | ICD-10-CM | POA: Diagnosis not present

## 2012-05-05 DIAGNOSIS — R918 Other nonspecific abnormal finding of lung field: Secondary | ICD-10-CM | POA: Diagnosis not present

## 2012-05-05 DIAGNOSIS — K659 Peritonitis, unspecified: Secondary | ICD-10-CM | POA: Diagnosis not present

## 2012-05-05 DIAGNOSIS — G4700 Insomnia, unspecified: Secondary | ICD-10-CM | POA: Diagnosis present

## 2012-05-05 DIAGNOSIS — Z95818 Presence of other cardiac implants and grafts: Secondary | ICD-10-CM | POA: Diagnosis not present

## 2012-05-05 DIAGNOSIS — Z7901 Long term (current) use of anticoagulants: Secondary | ICD-10-CM | POA: Diagnosis not present

## 2012-05-05 DIAGNOSIS — Z9889 Other specified postprocedural states: Secondary | ICD-10-CM | POA: Diagnosis not present

## 2012-05-05 DIAGNOSIS — Z95810 Presence of automatic (implantable) cardiac defibrillator: Secondary | ICD-10-CM | POA: Diagnosis not present

## 2012-05-05 DIAGNOSIS — I5022 Chronic systolic (congestive) heart failure: Secondary | ICD-10-CM | POA: Diagnosis present

## 2012-05-12 DIAGNOSIS — Z7901 Long term (current) use of anticoagulants: Secondary | ICD-10-CM | POA: Diagnosis not present

## 2012-05-12 DIAGNOSIS — Z95811 Presence of heart assist device: Secondary | ICD-10-CM | POA: Diagnosis not present

## 2012-05-14 DIAGNOSIS — Z95811 Presence of heart assist device: Secondary | ICD-10-CM | POA: Diagnosis not present

## 2012-05-14 DIAGNOSIS — Z7901 Long term (current) use of anticoagulants: Secondary | ICD-10-CM | POA: Diagnosis not present

## 2012-05-15 ENCOUNTER — Emergency Department (HOSPITAL_COMMUNITY): Payer: Medicare Other

## 2012-05-15 ENCOUNTER — Encounter (HOSPITAL_COMMUNITY): Payer: Self-pay | Admitting: *Deleted

## 2012-05-15 ENCOUNTER — Inpatient Hospital Stay (HOSPITAL_COMMUNITY)
Admission: EM | Admit: 2012-05-15 | Discharge: 2012-05-16 | DRG: 065 | Disposition: A | Discharge status: Home / Self Care | Payer: Medicare Other | Attending: Emergency Medicine | Admitting: Emergency Medicine

## 2012-05-15 DIAGNOSIS — Z9884 Bariatric surgery status: Secondary | ICD-10-CM

## 2012-05-15 DIAGNOSIS — E785 Hyperlipidemia, unspecified: Secondary | ICD-10-CM | POA: Diagnosis present

## 2012-05-15 DIAGNOSIS — Z8249 Family history of ischemic heart disease and other diseases of the circulatory system: Secondary | ICD-10-CM

## 2012-05-15 DIAGNOSIS — I428 Other cardiomyopathies: Secondary | ICD-10-CM | POA: Diagnosis not present

## 2012-05-15 DIAGNOSIS — Z8673 Personal history of transient ischemic attack (TIA), and cerebral infarction without residual deficits: Secondary | ICD-10-CM

## 2012-05-15 DIAGNOSIS — I6789 Other cerebrovascular disease: Secondary | ICD-10-CM | POA: Diagnosis not present

## 2012-05-15 DIAGNOSIS — Z823 Family history of stroke: Secondary | ICD-10-CM

## 2012-05-15 DIAGNOSIS — I6350 Cerebral infarction due to unspecified occlusion or stenosis of unspecified cerebral artery: Secondary | ICD-10-CM | POA: Diagnosis not present

## 2012-05-15 DIAGNOSIS — I10 Essential (primary) hypertension: Secondary | ICD-10-CM | POA: Diagnosis present

## 2012-05-15 DIAGNOSIS — R5383 Other fatigue: Secondary | ICD-10-CM | POA: Diagnosis not present

## 2012-05-15 DIAGNOSIS — I6340 Cerebral infarction due to embolism of unspecified cerebral artery: Principal | ICD-10-CM | POA: Diagnosis present

## 2012-05-15 DIAGNOSIS — I5022 Chronic systolic (congestive) heart failure: Secondary | ICD-10-CM | POA: Diagnosis present

## 2012-05-15 DIAGNOSIS — Z95810 Presence of automatic (implantable) cardiac defibrillator: Secondary | ICD-10-CM

## 2012-05-15 DIAGNOSIS — G4733 Obstructive sleep apnea (adult) (pediatric): Secondary | ICD-10-CM | POA: Diagnosis present

## 2012-05-15 DIAGNOSIS — G8190 Hemiplegia, unspecified affecting unspecified side: Secondary | ICD-10-CM | POA: Diagnosis present

## 2012-05-15 DIAGNOSIS — G988 Other disorders of nervous system: Secondary | ICD-10-CM | POA: Diagnosis not present

## 2012-05-15 DIAGNOSIS — I69998 Other sequelae following unspecified cerebrovascular disease: Secondary | ICD-10-CM | POA: Diagnosis not present

## 2012-05-15 DIAGNOSIS — I429 Cardiomyopathy, unspecified: Secondary | ICD-10-CM

## 2012-05-15 DIAGNOSIS — Z833 Family history of diabetes mellitus: Secondary | ICD-10-CM

## 2012-05-15 DIAGNOSIS — I4891 Unspecified atrial fibrillation: Secondary | ICD-10-CM | POA: Diagnosis present

## 2012-05-15 DIAGNOSIS — R5381 Other malaise: Secondary | ICD-10-CM | POA: Diagnosis not present

## 2012-05-15 DIAGNOSIS — I509 Heart failure, unspecified: Secondary | ICD-10-CM | POA: Diagnosis present

## 2012-05-15 DIAGNOSIS — E119 Type 2 diabetes mellitus without complications: Secondary | ICD-10-CM | POA: Diagnosis not present

## 2012-05-15 DIAGNOSIS — Z87891 Personal history of nicotine dependence: Secondary | ICD-10-CM

## 2012-05-15 DIAGNOSIS — Z7682 Awaiting organ transplant status: Secondary | ICD-10-CM

## 2012-05-15 DIAGNOSIS — I639 Cerebral infarction, unspecified: Secondary | ICD-10-CM

## 2012-05-15 LAB — CBC
Hemoglobin: 11.6 g/dL — ABNORMAL LOW (ref 13.0–17.0)
MCH: 30.3 pg (ref 26.0–34.0)
MCHC: 34.1 g/dL (ref 30.0–36.0)
Platelets: 189 10*3/uL (ref 150–400)
RDW: 15 % (ref 11.5–15.5)

## 2012-05-15 LAB — POCT I-STAT TROPONIN I

## 2012-05-15 LAB — PROTIME-INR: INR: 2.55 — ABNORMAL HIGH (ref 0.00–1.49)

## 2012-05-15 LAB — POCT I-STAT, CHEM 8
Calcium, Ion: 1.12 mmol/L (ref 1.12–1.23)
Creatinine, Ser: 1.4 mg/dL — ABNORMAL HIGH (ref 0.50–1.35)
Glucose, Bld: 95 mg/dL (ref 70–99)
HCT: 34 % — ABNORMAL LOW (ref 39.0–52.0)
Hemoglobin: 11.6 g/dL — ABNORMAL LOW (ref 13.0–17.0)
TCO2: 22 mmol/L (ref 0–100)

## 2012-05-15 MED ORDER — SODIUM CHLORIDE 0.9 % IV SOLN: INTRAVENOUS | Status: DC

## 2012-05-15 NOTE — ED Provider Notes (Signed)
History     CSN: [id number]  Arrival date & time 05/15/12  2329   First MD Initiated Contact with Patient 05/15/12 2333      Chief Complaint  Patient presents with  . Code Stroke   This patient is a pleasant 51 yo man with multiple chronic medical problems including severe cardiomyopathy with LVAD on transplant list [facility] Type II DM, history of stroke, h/o VT, HTN and morbid obesity. He was brought to the ED via EMS from his home. His wife noticed new onset of left sided facial droop and left arm weakness approx 30 to 7m prior to the patient's presentation. She says it seems to have improved a bit. Patient concurs.   Patient says he has a remote history of stroke which caused left sided weakness but, which he recovered from completely. He denies headache. Recent revision of LVAD exit site approx 1 week ago due to infection at previous site. Patient is anticoagulated with Coumadin and states his INR was last checked yesterday and at that time it was 1.8.   Pt reports compliance with all meds. No other complaints. No difficulty swallowing, no changes in vision or speech.   (Consider location/radiation/quality/duration/timing/severity/associated sxs/prior treatment) HPI  Past Medical History  Diagnosis Date  . Chronic systolic dysfunction of left ventricle   . Diabetes mellitus   . Hypertension   . CHF (congestive heart failure), NYHA class III     s/p BiV ICD and LVAD  . Left bundle branch block   . Atrial fibrillation   . Premature ventricular contractions   . Hyperlipidemia   . Morbid obesity     status post lap band  . Family history of coronary artery disease     in both parents  . Obstructive sleep apnea   . Nonischemic cardiomyopathy     Est. EF of 15% to 20%, s/p BiV ICD and LVAD, on [facility] list  . CVA (cerebral infarction)   . Stroke   . SOB (shortness of breath)     Past Surgical History  Procedure Date  . Cardiac pacemaker placement 09/21/2009   Biventricular implantable cardioverter-defibrillator implantation     . Laparoscopic gastric banding 01/27/2007  . Foot surgery     left  . Left ventricular assist device     implanted at [facility] History  Problem Relation Age of Onset  . Coronary artery disease Father     had, PTCA & CABG  . Heart attack Father   . Hypertension Father   . Diabetes Father   . Coronary artery disease Mother   . Heart attack Mother   . Hypertension Mother   . Stroke Mother   . Lupus Mother     History  Substance Use Topics  . Smoking status: Former Smoker    Types: Cigars  . Smokeless tobacco: Never Used  . Alcohol Use: No      Review of Systems] Gen - no fevers, chronically ill HEENT: no nasal drainage, no sore throat Eyes: no visual changes, no discharge Neck: no pain CV: chronic DOE, no cp, no palpitations Lungs: chronic DOE and orthopnea unchanged, no cough.  Abd: no pain, vomiting, diarrhea, bloody stools GU: no sx Skin: no rash MSK: no myalgias or arthrlagias Heme: no bloody stools, bleeding from gums or epistaxis  Allergies  Review of patient's allergies indicates no known allergies.  Home Medications   Current Outpatient Rx  Name Route Sig Dispense Refill  . AMIODARONE  HCL 200 MG PO TABS  200 mg daily.     . ATORVASTATIN CALCIUM 10 MG PO TABS  10 mg daily.     Marland Kitchen CARVEDILOL 3.125 MG PO TABS  3.125 mg 2 (two) times daily with a meal.     . FUROSEMIDE 80 MG PO TABS  TAKE 1 TABLET BY MOUTH TWICE DAILY 60 tablet 4  . KLOR-CON 10 10 MEQ PO TBCR  10 mEq daily.     Marland Kitchen LISINOPRIL 5 MG PO TABS  Daily.    Marland Kitchen OMEPRAZOLE 20 MG PO CPDR  20 mg daily.     . WARFARIN SODIUM 5 MG PO TABS  5 mg daily. As Directed      BP 92/1  Pulse 81  Temp 98.1 F (36.7 C) (Oral)  Resp 20  SpO2 97%  Physical Exam  Gen: super morbidly obese appearing, no acute distress, laying supine on gurney Head: NCAT Eyes: PERL, EOMI Nose: no epistaixis or rhinorrhea Mouth/throat: mucosa is moist  and pink Neck: supple, no stridor Lungs: lung sounds obscured by LVAD artifact noise, RR 20/min, pulse ox 98% on RA CV: RR, extremities appear well perfused but, unable to palpate radial pulses on either side Abd: morbidly obese, LVAD exit site RLQ with dressing, no active bleeding, no signs of infection.  Back: no abnormalities noted Skin: no rash, wnl Neuro: slightly slurred speech, facial droop noted on left, 3+/5 strength LUE, 5/5 RUE, 5/5 both LE, gait not assessed Psyche; normal affect,  calm and cooperative.   ED Course  Procedures (including critical care time)  EKG: paced rhythm, AV dual pacer, unable to rule out acute ischemia based on this, wide qrs is normal for AV pacer, no PR interval.   Results for orders placed during the hospital encounter of 05/15/12 (from the past 24 hour(s))  CBC     Status: Abnormal   Collection Time   05/15/12 11:31 PM      Component Value Range   WBC 9.1  4.0 - 10.5 K/uL   RBC 3.83 (*) 4.22 - 5.81 MIL/uL   Hemoglobin 11.6 (*) 13.0 - 17.0 g/dL   HCT 34.0 (*) 39.0 - 52.0 %   MCV 88.8  78.0 - 100.0 fL   MCH 30.3  26.0 - 34.0 pg   MCHC 34.1  30.0 - 36.0 g/dL   RDW 15.0  11.5 - 15.5 %   Platelets 189  150 - 400 K/uL  PROTIME-INR     Status: Abnormal   Collection Time   05/15/12 11:31 PM      Component Value Range   Prothrombin Time 26.2 (*) 11.6 - 15.2 seconds   INR 2.55 (*) 0.00 - 1.49  APTT     Status: Abnormal   Collection Time   05/15/12 11:31 PM      Component Value Range   aPTT 47 (*) 24 - 37 seconds  POCT I-STAT TROPONIN I     Status: Normal   Collection Time   05/15/12 11:34 PM      Component Value Range   Troponin i, poc 0.01  0.00 - 0.08 ng/mL   Comment 3           POCT I-STAT, CHEM 8     Status: Abnormal   Collection Time   05/15/12 11:35 PM      Component Value Range   Sodium 139  135 - 145 mEq/L   Potassium 4.7  3.5 - 5.1 mEq/L   Chloride 105  96 - 112 mEq/L   BUN 14  6 - 23 mg/dL   Creatinine, Ser 1.40 (*) 0.50 - 1.35  mg/dL   Glucose, Bld 95  70 - 99 mg/dL   Calcium, Ion 1.12  1.12 - 1.23 mmol/L   TCO2 22  0 - 100 mmol/L   Hemoglobin 11.6 (*) 13.0 - 17.0 g/dL   HCT 34.0 (*) 39.0 - 52.0 %  GLUCOSE, CAPILLARY     Status: Normal   Collection Time   05/16/12 12:00 AM      Component Value Range   Glucose-Capillary 94  70 - 99 mg/dL   Comment 1 Notify RN     Comment 2 Documented in Chart     Ct Head Wo Contrast  05/15/2012  *RADIOLOGY REPORT*  Clinical Data: Code stroke.  Left-sided weakness.  CT HEAD WITHOUT CONTRAST  Technique:  Contiguous axial images were obtained from the base of the skull through the vertex without contrast.  Comparison: CT head 09/06/2008.  MRI brain 09/06/2008.  Findings: There is no evidence for acute infarction, intracranial hemorrhage, mass lesion, hydrocephalus, or extra-axial fluid.  No significant atrophy or white matter disease.  There is a remote right posterior frontal infarct with encephalomalacia.  Calvarium intact.  Mega cisterna magna. No acute sinus or mastoid disease. Compared with priors, a similar appearance is noted.  IMPRESSION: No acute intracranial findings.  No acute stroke or bleed.   Original Report Authenticated By: Staci Righter, M.D.    Patient with fairly mild sx of acute CVA. NIH stroke scale 3.  Patient's mild sx, recent surgical procedure and anticoagulation with Coumadin and an INR of 2.55 exclude him as a candidate for thrombolysis. Neurology has seen, examined the patient and concurs. The patient will receive ASA. He has been accepted for admission to the SICU under the care of Dr. Zoila Shutter.     MDM  See above  CRITICAL CARE Performed by: Elyn Peers   Total critical care time: 35  Critical care time was exclusive of separately billable procedures and treating other patients.  Critical care was necessary to treat or prevent imminent or life-threatening deterioration.  Critical care was time spent personally by me on the following activities:  development of treatment plan with patient and/or surrogate as well as nursing, discussions with consultants, evaluation of patient's response to treatment, examination of patient, obtaining history from patient or surrogate, ordering and performing treatments and interventions, ordering and review of laboratory studies, ordering and review of radiographic studies, pulse oximetry and re-evaluation of patient's condition.         Elyn Peers, MD 05/16/12 [phone]

## 2012-05-15 NOTE — ED Notes (Signed)
Pt in via EMS, per EMS- At 2245 patient noted to develop left sided facial droop and left arm weakness, no weakness to left leg noted. Upon arrival to ED, pt transported to CT, left facial droop noted and left arm weakness. Normal movement in left leg. Pt is LVAD patient and is two weeks post procedure in reference to that, exit site moved due to infection. Pt alert and oriented, communicating well. No distress noted.

## 2012-05-16 DIAGNOSIS — Z8249 Family history of ischemic heart disease and other diseases of the circulatory system: Secondary | ICD-10-CM | POA: Diagnosis not present

## 2012-05-16 DIAGNOSIS — Z87891 Personal history of nicotine dependence: Secondary | ICD-10-CM | POA: Diagnosis not present

## 2012-05-16 DIAGNOSIS — G988 Other disorders of nervous system: Secondary | ICD-10-CM | POA: Diagnosis not present

## 2012-05-16 DIAGNOSIS — D649 Anemia, unspecified: Secondary | ICD-10-CM | POA: Diagnosis not present

## 2012-05-16 DIAGNOSIS — Z823 Family history of stroke: Secondary | ICD-10-CM | POA: Diagnosis not present

## 2012-05-16 DIAGNOSIS — M6281 Muscle weakness (generalized): Secondary | ICD-10-CM

## 2012-05-16 DIAGNOSIS — Z9884 Bariatric surgery status: Secondary | ICD-10-CM | POA: Diagnosis not present

## 2012-05-16 DIAGNOSIS — I69998 Other sequelae following unspecified cerebrovascular disease: Secondary | ICD-10-CM | POA: Diagnosis not present

## 2012-05-16 DIAGNOSIS — G4733 Obstructive sleep apnea (adult) (pediatric): Secondary | ICD-10-CM | POA: Diagnosis not present

## 2012-05-16 DIAGNOSIS — F411 Generalized anxiety disorder: Secondary | ICD-10-CM | POA: Diagnosis present

## 2012-05-16 DIAGNOSIS — I5022 Chronic systolic (congestive) heart failure: Secondary | ICD-10-CM | POA: Diagnosis present

## 2012-05-16 DIAGNOSIS — R471 Dysarthria and anarthria: Secondary | ICD-10-CM | POA: Diagnosis not present

## 2012-05-16 DIAGNOSIS — R5383 Other fatigue: Secondary | ICD-10-CM | POA: Diagnosis not present

## 2012-05-16 DIAGNOSIS — Z95810 Presence of automatic (implantable) cardiac defibrillator: Secondary | ICD-10-CM | POA: Diagnosis not present

## 2012-05-16 DIAGNOSIS — E119 Type 2 diabetes mellitus without complications: Secondary | ICD-10-CM | POA: Diagnosis not present

## 2012-05-16 DIAGNOSIS — G8190 Hemiplegia, unspecified affecting unspecified side: Secondary | ICD-10-CM | POA: Diagnosis not present

## 2012-05-16 DIAGNOSIS — Z7682 Awaiting organ transplant status: Secondary | ICD-10-CM | POA: Diagnosis not present

## 2012-05-16 DIAGNOSIS — Z7901 Long term (current) use of anticoagulants: Secondary | ICD-10-CM | POA: Diagnosis not present

## 2012-05-16 DIAGNOSIS — D680 Von Willebrand's disease: Secondary | ICD-10-CM | POA: Diagnosis not present

## 2012-05-16 DIAGNOSIS — I4891 Unspecified atrial fibrillation: Secondary | ICD-10-CM | POA: Diagnosis not present

## 2012-05-16 DIAGNOSIS — I10 Essential (primary) hypertension: Secondary | ICD-10-CM | POA: Diagnosis not present

## 2012-05-16 DIAGNOSIS — I6350 Cerebral infarction due to unspecified occlusion or stenosis of unspecified cerebral artery: Secondary | ICD-10-CM | POA: Diagnosis not present

## 2012-05-16 DIAGNOSIS — I6340 Cerebral infarction due to embolism of unspecified cerebral artery: Secondary | ICD-10-CM | POA: Diagnosis not present

## 2012-05-16 DIAGNOSIS — Z8673 Personal history of transient ischemic attack (TIA), and cerebral infarction without residual deficits: Secondary | ICD-10-CM | POA: Diagnosis not present

## 2012-05-16 DIAGNOSIS — Z95811 Presence of heart assist device: Secondary | ICD-10-CM | POA: Diagnosis not present

## 2012-05-16 DIAGNOSIS — I509 Heart failure, unspecified: Secondary | ICD-10-CM | POA: Diagnosis not present

## 2012-05-16 DIAGNOSIS — R1312 Dysphagia, oropharyngeal phase: Secondary | ICD-10-CM | POA: Diagnosis not present

## 2012-05-16 DIAGNOSIS — I428 Other cardiomyopathies: Secondary | ICD-10-CM | POA: Diagnosis present

## 2012-05-16 DIAGNOSIS — R29898 Other symptoms and signs involving the musculoskeletal system: Secondary | ICD-10-CM | POA: Diagnosis present

## 2012-05-16 DIAGNOSIS — E785 Hyperlipidemia, unspecified: Secondary | ICD-10-CM | POA: Diagnosis present

## 2012-05-16 DIAGNOSIS — Z833 Family history of diabetes mellitus: Secondary | ICD-10-CM | POA: Diagnosis not present

## 2012-05-16 LAB — GLUCOSE, CAPILLARY

## 2012-05-16 LAB — BASIC METABOLIC PANEL
Calcium: 9.2 mg/dL (ref 8.4–10.5)
GFR calc Af Amer: 80 mL/min — ABNORMAL LOW (ref >90)
GFR calc non Af Amer: 69 mL/min — ABNORMAL LOW (ref >90)
Glucose, Bld: 94 mg/dL (ref 70–99)
Sodium: 135 mEq/L (ref 135–145)

## 2012-05-16 NOTE — Code Documentation (Signed)
51 yo bm brought in via [facility] for acute onset facial droop & Lt arm weakness.  Pt has an LVAD & was d/c'd from [place] within the last week after rcving a new exit site for his drive cable.  Pt's wife was changing his site drsg when symptoms began.  Code stroke encoded 2310, code stroke called 2308, pt arrival 2324, EDP exam 2324, stroke team arrival 2324, LSN 2245, pt arrival in [place], phlebotomist arrival 2324, [place] read by neurologist 2348, code stroke cancelled 2357. Not a tPA candidate or an interventional candidate due to INR. Pt NIH 4 for facial droop, Lt arm weakness, & dysarthria.  Case discussed with Dr. Haroldine Laws & pt & family requesting transfer to [facility].

## 2012-05-16 NOTE — Consult Note (Signed)
Reason for Consult: Concern for stroke Referring Physician: Elyn Peers  CC: Left hemiparesis  History is obtained from: Patient, wife  HPI: [name] is an 51 y.o. male was having his dressing changed shortly before 8 PM when was noticed that he had sudden onset left arm weakness and facial droop. He also states that it felt like his arm "went to sleep."   He has a history of heart failure and is status post LVAD placement to half years ago, he recently underwent abdominal surgery to move the exit site do to infection. For this surgery he had his Coumadin held, and restarted on Saturday. On Monday, his INR was 1.2, on Wednesday it was 1.8 and today it is 2.5.   TPA given: No, INR is elevated  ROS: An 11 point ROS was performed and is negative except as noted in the HPI.  Past Medical History  Diagnosis Date  . Chronic systolic dysfunction of left ventricle   . Diabetes mellitus   . Hypertension   . CHF (congestive heart failure), NYHA class III     s/p BiV ICD and LVAD  . Left bundle branch block   . Atrial fibrillation   . Premature ventricular contractions   . Hyperlipidemia   . Morbid obesity     status post lap band  . Family history of coronary artery disease     in both parents  . Obstructive sleep apnea   . Nonischemic cardiomyopathy     Est. EF of 15% to 20%, s/p BiV ICD and LVAD, on [facility] list  . CVA (cerebral infarction)   . Stroke   . SOB (shortness of breath)     Family History: Mother-stroke  Social History: Tob: Former smoker  Exam: Current vital signs: BP 102/1  Pulse 80  Temp 98.1 F (36.7 C) (Oral)  Resp 24  SpO2 94% Vital signs in last 24 hours: Temp:  [98.1 F (36.7 C)] 98.1 F (36.7 C) (09/19 2355) Pulse Rate:  [79-81] 80  (09/20 0030) Resp:  [20-24] 24  (09/20 0030) BP: (92-102)/(1) 102/1 mmHg (09/20 0033) SpO2:  [94 %-98 %] 94 % (09/20 0030)  General: In bed CV: Pulses difficult to palpate, paced rhythm on monitor,  2+ edema Mental Status: Patient is awake, alert, oriented to person, place, month, year, and situation. Immediate and remote memory are intact. Patient is able to give a clear and coherent history. Mild dysarthria.  Cranial Nerves: II: Visual Fields are full. Pupils are equal, round, and reactive to light.  Discs are difficult to visualize. III,IV, VI: EOMI without ptosis or diploplia.  V,VII: Facial sensation symmetric, incomplete left lower facial droop.  VIII: hearing is intact to voice X: Uvula elevates symmetrically XI: Shoulder shrug is symmetric. XII: tongue is midline without atrophy or fasciculations.  Motor: Tone is decreased in his left arm. Bulk is normal.  He has 5/5 strength throughout his right side, 3/5 in his left upper extremity, distal > proximal, 5/5 without drift in his left lower extremity.  Sensory: Sensation is symmetric to light touch and pinprick in the arms, legs, and face Deep Tendon Reflexes: 2+ and symmetric in the biceps and patellae.  Plantars: Toes are downgoing bilaterally.  Cerebellar: FNF intact on right, consistent with weakness on left Gait: Did not assess due to the acute nature of the evaluation and consideration of TPA.   I have reviewed labs in epic and the results pertinent to this consultation are: INR-2.5 Creatinine 1.4,  BMP otherwise unremarkable CBC with mild anemia, otherwise unremarkable  I have reviewed the images obtained: CT head - no acute findings,   Impression: 51 year old male who presents with findings consistent with acute infarct. Though his exam is consistent with a small internal capsule stroke without cortical findings, I suspect that his etiology is likely embolic given his recent subtherapeutic INR in the setting of his low EF. He is unable to obtain an MRI due to his ICD and LVAD. His NIHSS was 4. He is not a candidate for IV tPA or interventional therapy due to an INR of 2.5.  Recommendations: 1. HgbA1c,  fasting lipid panel 2. repeat CT head in 48 hours 3. PT consult, OT consult, Speech consult 4. Echocardiogram 5. Carotid dopplers 6. Prophylactic therapy-Antiplatelet med: Aspirin - dose 325mg  and Coumadin 7. Telemetry monitoring 8. Frequent neuro checks 9. if he is admitted here, he will be monitored in the SICU, however given his recent surgery at [facility], he may be transferred back there.   Roland Rack, MD [facility] [phone]

## 2012-05-22 DIAGNOSIS — Z95811 Presence of heart assist device: Secondary | ICD-10-CM | POA: Diagnosis not present

## 2012-05-22 DIAGNOSIS — Z7901 Long term (current) use of anticoagulants: Secondary | ICD-10-CM | POA: Diagnosis not present

## 2012-05-26 DIAGNOSIS — Z95811 Presence of heart assist device: Secondary | ICD-10-CM | POA: Diagnosis not present

## 2012-05-26 DIAGNOSIS — Z7901 Long term (current) use of anticoagulants: Secondary | ICD-10-CM | POA: Diagnosis not present

## 2012-05-28 DIAGNOSIS — T82897A Other specified complication of cardiac prosthetic devices, implants and grafts, initial encounter: Secondary | ICD-10-CM | POA: Diagnosis not present

## 2012-05-28 DIAGNOSIS — Z9889 Other specified postprocedural states: Secondary | ICD-10-CM | POA: Diagnosis not present

## 2012-05-28 DIAGNOSIS — Z5189 Encounter for other specified aftercare: Secondary | ICD-10-CM | POA: Diagnosis not present

## 2012-05-28 DIAGNOSIS — B9689 Other specified bacterial agents as the cause of diseases classified elsewhere: Secondary | ICD-10-CM | POA: Diagnosis not present

## 2012-05-28 DIAGNOSIS — T827XXA Infection and inflammatory reaction due to other cardiac and vascular devices, implants and grafts, initial encounter: Secondary | ICD-10-CM | POA: Diagnosis not present

## 2012-05-28 DIAGNOSIS — Z95818 Presence of other cardiac implants and grafts: Secondary | ICD-10-CM | POA: Diagnosis not present

## 2012-06-03 ENCOUNTER — Ambulatory Visit: Payer: Medicare Other | Attending: Family Medicine | Admitting: Speech Pathology

## 2012-06-03 ENCOUNTER — Ambulatory Visit: Payer: Medicare Other | Admitting: Occupational Therapy

## 2012-06-03 DIAGNOSIS — I69959 Hemiplegia and hemiparesis following unspecified cerebrovascular disease affecting unspecified side: Secondary | ICD-10-CM | POA: Insufficient documentation

## 2012-06-03 DIAGNOSIS — I69992 Facial weakness following unspecified cerebrovascular disease: Secondary | ICD-10-CM | POA: Insufficient documentation

## 2012-06-03 DIAGNOSIS — I69922 Dysarthria following unspecified cerebrovascular disease: Secondary | ICD-10-CM | POA: Insufficient documentation

## 2012-06-03 DIAGNOSIS — R279 Unspecified lack of coordination: Secondary | ICD-10-CM | POA: Insufficient documentation

## 2012-06-03 DIAGNOSIS — I69998 Other sequelae following unspecified cerebrovascular disease: Secondary | ICD-10-CM | POA: Diagnosis not present

## 2012-06-03 DIAGNOSIS — Z5189 Encounter for other specified aftercare: Secondary | ICD-10-CM | POA: Insufficient documentation

## 2012-06-05 ENCOUNTER — Ambulatory Visit: Payer: Medicare Other | Admitting: Occupational Therapy

## 2012-06-05 DIAGNOSIS — I69992 Facial weakness following unspecified cerebrovascular disease: Secondary | ICD-10-CM | POA: Diagnosis not present

## 2012-06-05 DIAGNOSIS — I69959 Hemiplegia and hemiparesis following unspecified cerebrovascular disease affecting unspecified side: Secondary | ICD-10-CM | POA: Diagnosis not present

## 2012-06-05 DIAGNOSIS — I69922 Dysarthria following unspecified cerebrovascular disease: Secondary | ICD-10-CM | POA: Diagnosis not present

## 2012-06-05 DIAGNOSIS — R279 Unspecified lack of coordination: Secondary | ICD-10-CM | POA: Diagnosis not present

## 2012-06-05 DIAGNOSIS — Z95811 Presence of heart assist device: Secondary | ICD-10-CM | POA: Diagnosis not present

## 2012-06-05 DIAGNOSIS — Z5189 Encounter for other specified aftercare: Secondary | ICD-10-CM | POA: Diagnosis not present

## 2012-06-05 DIAGNOSIS — I69998 Other sequelae following unspecified cerebrovascular disease: Secondary | ICD-10-CM | POA: Diagnosis not present

## 2012-06-05 DIAGNOSIS — Z7901 Long term (current) use of anticoagulants: Secondary | ICD-10-CM | POA: Diagnosis not present

## 2012-06-09 DIAGNOSIS — Z0189 Encounter for other specified special examinations: Secondary | ICD-10-CM | POA: Diagnosis not present

## 2012-06-13 ENCOUNTER — Ambulatory Visit: Payer: Medicare Other | Admitting: Occupational Therapy

## 2012-06-13 DIAGNOSIS — I69959 Hemiplegia and hemiparesis following unspecified cerebrovascular disease affecting unspecified side: Secondary | ICD-10-CM | POA: Diagnosis not present

## 2012-06-13 DIAGNOSIS — R279 Unspecified lack of coordination: Secondary | ICD-10-CM | POA: Diagnosis not present

## 2012-06-13 DIAGNOSIS — I69998 Other sequelae following unspecified cerebrovascular disease: Secondary | ICD-10-CM | POA: Diagnosis not present

## 2012-06-13 DIAGNOSIS — I69992 Facial weakness following unspecified cerebrovascular disease: Secondary | ICD-10-CM | POA: Diagnosis not present

## 2012-06-13 DIAGNOSIS — I69922 Dysarthria following unspecified cerebrovascular disease: Secondary | ICD-10-CM | POA: Diagnosis not present

## 2012-06-13 DIAGNOSIS — Z5189 Encounter for other specified aftercare: Secondary | ICD-10-CM | POA: Diagnosis not present

## 2012-06-16 ENCOUNTER — Ambulatory Visit: Payer: Medicare Other | Admitting: Occupational Therapy

## 2012-06-16 ENCOUNTER — Ambulatory Visit: Payer: Medicare Other | Admitting: Speech Pathology

## 2012-06-16 DIAGNOSIS — R279 Unspecified lack of coordination: Secondary | ICD-10-CM | POA: Diagnosis not present

## 2012-06-16 DIAGNOSIS — I69922 Dysarthria following unspecified cerebrovascular disease: Secondary | ICD-10-CM | POA: Diagnosis not present

## 2012-06-16 DIAGNOSIS — I69959 Hemiplegia and hemiparesis following unspecified cerebrovascular disease affecting unspecified side: Secondary | ICD-10-CM | POA: Diagnosis not present

## 2012-06-16 DIAGNOSIS — I69992 Facial weakness following unspecified cerebrovascular disease: Secondary | ICD-10-CM | POA: Diagnosis not present

## 2012-06-16 DIAGNOSIS — Z5189 Encounter for other specified aftercare: Secondary | ICD-10-CM | POA: Diagnosis not present

## 2012-06-16 DIAGNOSIS — Z95811 Presence of heart assist device: Secondary | ICD-10-CM | POA: Diagnosis not present

## 2012-06-16 DIAGNOSIS — Z7901 Long term (current) use of anticoagulants: Secondary | ICD-10-CM | POA: Diagnosis not present

## 2012-06-16 DIAGNOSIS — I69998 Other sequelae following unspecified cerebrovascular disease: Secondary | ICD-10-CM | POA: Diagnosis not present

## 2012-06-18 ENCOUNTER — Ambulatory Visit: Payer: Medicare Other | Admitting: Occupational Therapy

## 2012-06-18 DIAGNOSIS — I69922 Dysarthria following unspecified cerebrovascular disease: Secondary | ICD-10-CM | POA: Diagnosis not present

## 2012-06-18 DIAGNOSIS — R279 Unspecified lack of coordination: Secondary | ICD-10-CM | POA: Diagnosis not present

## 2012-06-18 DIAGNOSIS — I69992 Facial weakness following unspecified cerebrovascular disease: Secondary | ICD-10-CM | POA: Diagnosis not present

## 2012-06-18 DIAGNOSIS — Z5189 Encounter for other specified aftercare: Secondary | ICD-10-CM | POA: Diagnosis not present

## 2012-06-18 DIAGNOSIS — I69959 Hemiplegia and hemiparesis following unspecified cerebrovascular disease affecting unspecified side: Secondary | ICD-10-CM | POA: Diagnosis not present

## 2012-06-18 DIAGNOSIS — I69998 Other sequelae following unspecified cerebrovascular disease: Secondary | ICD-10-CM | POA: Diagnosis not present

## 2012-06-20 DIAGNOSIS — L84 Corns and callosities: Secondary | ICD-10-CM | POA: Diagnosis not present

## 2012-06-20 DIAGNOSIS — Q828 Other specified congenital malformations of skin: Secondary | ICD-10-CM | POA: Diagnosis not present

## 2012-06-24 ENCOUNTER — Ambulatory Visit: Payer: Medicare Other | Admitting: Occupational Therapy

## 2012-06-24 ENCOUNTER — Ambulatory Visit: Payer: Medicare Other

## 2012-06-24 DIAGNOSIS — R279 Unspecified lack of coordination: Secondary | ICD-10-CM | POA: Diagnosis not present

## 2012-06-24 DIAGNOSIS — I69922 Dysarthria following unspecified cerebrovascular disease: Secondary | ICD-10-CM | POA: Diagnosis not present

## 2012-06-24 DIAGNOSIS — I69959 Hemiplegia and hemiparesis following unspecified cerebrovascular disease affecting unspecified side: Secondary | ICD-10-CM | POA: Diagnosis not present

## 2012-06-24 DIAGNOSIS — I69992 Facial weakness following unspecified cerebrovascular disease: Secondary | ICD-10-CM | POA: Diagnosis not present

## 2012-06-24 DIAGNOSIS — I69998 Other sequelae following unspecified cerebrovascular disease: Secondary | ICD-10-CM | POA: Diagnosis not present

## 2012-06-24 DIAGNOSIS — Z5189 Encounter for other specified aftercare: Secondary | ICD-10-CM | POA: Diagnosis not present

## 2012-06-25 ENCOUNTER — Encounter: Payer: Medicare Other | Admitting: Speech Pathology

## 2012-06-25 ENCOUNTER — Encounter: Payer: Medicare Other | Admitting: Occupational Therapy

## 2012-06-25 DIAGNOSIS — Z8673 Personal history of transient ischemic attack (TIA), and cerebral infarction without residual deficits: Secondary | ICD-10-CM | POA: Diagnosis not present

## 2012-06-25 DIAGNOSIS — K651 Peritoneal abscess: Secondary | ICD-10-CM | POA: Diagnosis not present

## 2012-06-25 DIAGNOSIS — I428 Other cardiomyopathies: Secondary | ICD-10-CM | POA: Diagnosis not present

## 2012-06-25 DIAGNOSIS — Z95811 Presence of heart assist device: Secondary | ICD-10-CM | POA: Diagnosis not present

## 2012-06-26 ENCOUNTER — Ambulatory Visit: Payer: Medicare Other | Admitting: Occupational Therapy

## 2012-06-26 DIAGNOSIS — I69959 Hemiplegia and hemiparesis following unspecified cerebrovascular disease affecting unspecified side: Secondary | ICD-10-CM | POA: Diagnosis not present

## 2012-06-26 DIAGNOSIS — I69992 Facial weakness following unspecified cerebrovascular disease: Secondary | ICD-10-CM | POA: Diagnosis not present

## 2012-06-26 DIAGNOSIS — I69922 Dysarthria following unspecified cerebrovascular disease: Secondary | ICD-10-CM | POA: Diagnosis not present

## 2012-06-26 DIAGNOSIS — R279 Unspecified lack of coordination: Secondary | ICD-10-CM | POA: Diagnosis not present

## 2012-06-26 DIAGNOSIS — I69998 Other sequelae following unspecified cerebrovascular disease: Secondary | ICD-10-CM | POA: Diagnosis not present

## 2012-06-26 DIAGNOSIS — Z5189 Encounter for other specified aftercare: Secondary | ICD-10-CM | POA: Diagnosis not present

## 2012-06-30 DIAGNOSIS — Z7901 Long term (current) use of anticoagulants: Secondary | ICD-10-CM | POA: Diagnosis not present

## 2012-06-30 DIAGNOSIS — Z95811 Presence of heart assist device: Secondary | ICD-10-CM | POA: Diagnosis not present

## 2012-07-01 ENCOUNTER — Ambulatory Visit: Payer: Medicare Other

## 2012-07-01 ENCOUNTER — Ambulatory Visit: Payer: Medicare Other | Attending: Family Medicine | Admitting: Occupational Therapy

## 2012-07-01 DIAGNOSIS — I69992 Facial weakness following unspecified cerebrovascular disease: Secondary | ICD-10-CM | POA: Insufficient documentation

## 2012-07-01 DIAGNOSIS — I69998 Other sequelae following unspecified cerebrovascular disease: Secondary | ICD-10-CM | POA: Insufficient documentation

## 2012-07-01 DIAGNOSIS — I69922 Dysarthria following unspecified cerebrovascular disease: Secondary | ICD-10-CM | POA: Diagnosis not present

## 2012-07-01 DIAGNOSIS — R279 Unspecified lack of coordination: Secondary | ICD-10-CM | POA: Diagnosis not present

## 2012-07-01 DIAGNOSIS — Z5189 Encounter for other specified aftercare: Secondary | ICD-10-CM | POA: Diagnosis not present

## 2012-07-03 DIAGNOSIS — Z472 Encounter for removal of internal fixation device: Secondary | ICD-10-CM | POA: Diagnosis not present

## 2012-07-04 ENCOUNTER — Ambulatory Visit: Payer: Medicare Other

## 2012-07-04 ENCOUNTER — Ambulatory Visit: Payer: Medicare Other | Admitting: Occupational Therapy

## 2012-07-04 DIAGNOSIS — I69992 Facial weakness following unspecified cerebrovascular disease: Secondary | ICD-10-CM | POA: Diagnosis not present

## 2012-07-04 DIAGNOSIS — R279 Unspecified lack of coordination: Secondary | ICD-10-CM | POA: Diagnosis not present

## 2012-07-04 DIAGNOSIS — I69922 Dysarthria following unspecified cerebrovascular disease: Secondary | ICD-10-CM | POA: Diagnosis not present

## 2012-07-04 DIAGNOSIS — I69998 Other sequelae following unspecified cerebrovascular disease: Secondary | ICD-10-CM | POA: Diagnosis not present

## 2012-07-04 DIAGNOSIS — Z5189 Encounter for other specified aftercare: Secondary | ICD-10-CM | POA: Diagnosis not present

## 2012-07-08 ENCOUNTER — Encounter: Payer: Medicare Other | Admitting: Occupational Therapy

## 2012-07-08 ENCOUNTER — Ambulatory Visit: Payer: Medicare Other

## 2012-07-08 DIAGNOSIS — I69998 Other sequelae following unspecified cerebrovascular disease: Secondary | ICD-10-CM | POA: Diagnosis not present

## 2012-07-08 DIAGNOSIS — R279 Unspecified lack of coordination: Secondary | ICD-10-CM | POA: Diagnosis not present

## 2012-07-08 DIAGNOSIS — I69922 Dysarthria following unspecified cerebrovascular disease: Secondary | ICD-10-CM | POA: Diagnosis not present

## 2012-07-08 DIAGNOSIS — I69992 Facial weakness following unspecified cerebrovascular disease: Secondary | ICD-10-CM | POA: Diagnosis not present

## 2012-07-08 DIAGNOSIS — Z5189 Encounter for other specified aftercare: Secondary | ICD-10-CM | POA: Diagnosis not present

## 2012-07-09 DIAGNOSIS — Z95811 Presence of heart assist device: Secondary | ICD-10-CM | POA: Diagnosis not present

## 2012-07-09 DIAGNOSIS — Z7901 Long term (current) use of anticoagulants: Secondary | ICD-10-CM | POA: Diagnosis not present

## 2012-07-10 ENCOUNTER — Encounter: Payer: Medicare Other | Admitting: Occupational Therapy

## 2012-07-10 DIAGNOSIS — Z472 Encounter for removal of internal fixation device: Secondary | ICD-10-CM | POA: Diagnosis not present

## 2012-07-15 ENCOUNTER — Encounter: Payer: Medicare Other | Admitting: Occupational Therapy

## 2012-07-16 ENCOUNTER — Encounter: Payer: Medicare Other | Admitting: Speech Pathology

## 2012-07-16 ENCOUNTER — Encounter: Payer: Medicare Other | Admitting: Occupational Therapy

## 2012-07-17 ENCOUNTER — Encounter: Payer: Medicare Other | Admitting: Speech Pathology

## 2012-07-28 ENCOUNTER — Encounter: Payer: Self-pay | Admitting: Internal Medicine

## 2012-07-28 ENCOUNTER — Ambulatory Visit (INDEPENDENT_AMBULATORY_CARE_PROVIDER_SITE_OTHER): Payer: Medicare Other | Admitting: *Deleted

## 2012-07-28 DIAGNOSIS — I428 Other cardiomyopathies: Secondary | ICD-10-CM | POA: Diagnosis not present

## 2012-07-28 DIAGNOSIS — I472 Ventricular tachycardia: Secondary | ICD-10-CM

## 2012-07-28 DIAGNOSIS — I4729 Other ventricular tachycardia: Secondary | ICD-10-CM

## 2012-07-28 DIAGNOSIS — Z95811 Presence of heart assist device: Secondary | ICD-10-CM | POA: Diagnosis not present

## 2012-07-28 DIAGNOSIS — Z7901 Long term (current) use of anticoagulants: Secondary | ICD-10-CM | POA: Diagnosis not present

## 2012-08-04 LAB — REMOTE ICD DEVICE
AL AMPLITUDE: 5.1 mv
ATRIAL PACING ICD: 97.61 pct
CHARGE TIME: 9.899 s
LV LEAD IMPEDENCE ICD: 323 Ohm
RV LEAD IMPEDENCE ICD: 494 Ohm
TOT-0001: 4
TOT-0006: 20110111000000
TZAT-0001ATACH: 1
TZAT-0002ATACH: NEGATIVE
TZAT-0002ATACH: NEGATIVE
TZAT-0002SLOWVT: NEGATIVE
TZAT-0012ATACH: 150 ms
TZAT-0012FASTVT: 200 ms
TZAT-0018ATACH: NEGATIVE
TZAT-0018ATACH: NEGATIVE
TZAT-0018FASTVT: NEGATIVE
TZAT-0019ATACH: 6 V
TZAT-0019ATACH: 6 V
TZAT-0019FASTVT: 8 V
TZAT-0019SLOWVT: 8 V
TZAT-0020ATACH: 1.5 ms
TZAT-0020FASTVT: 1.5 ms
TZON-0003ATACH: 350 ms
TZST-0001ATACH: 5
TZST-0001FASTVT: 5
TZST-0001SLOWVT: 3
TZST-0001SLOWVT: 5
TZST-0002ATACH: NEGATIVE
TZST-0002FASTVT: NEGATIVE
TZST-0002FASTVT: NEGATIVE
TZST-0002FASTVT: NEGATIVE
TZST-0002SLOWVT: NEGATIVE
TZST-0002SLOWVT: NEGATIVE
VF: 0

## 2012-08-05 DIAGNOSIS — Z7901 Long term (current) use of anticoagulants: Secondary | ICD-10-CM | POA: Diagnosis not present

## 2012-08-05 DIAGNOSIS — Z95811 Presence of heart assist device: Secondary | ICD-10-CM | POA: Diagnosis not present

## 2012-08-06 ENCOUNTER — Encounter: Payer: Self-pay | Admitting: *Deleted

## 2012-08-13 DIAGNOSIS — Z7901 Long term (current) use of anticoagulants: Secondary | ICD-10-CM | POA: Diagnosis not present

## 2012-08-13 DIAGNOSIS — Z95811 Presence of heart assist device: Secondary | ICD-10-CM | POA: Diagnosis not present

## 2012-08-27 HISTORY — PX: HEART TRANSPLANT: SHX268

## 2012-09-05 DIAGNOSIS — Z95811 Presence of heart assist device: Secondary | ICD-10-CM | POA: Diagnosis not present

## 2012-09-05 DIAGNOSIS — Z7901 Long term (current) use of anticoagulants: Secondary | ICD-10-CM | POA: Diagnosis not present

## 2012-10-01 DIAGNOSIS — Z95818 Presence of other cardiac implants and grafts: Secondary | ICD-10-CM | POA: Diagnosis not present

## 2012-10-01 DIAGNOSIS — Z95811 Presence of heart assist device: Secondary | ICD-10-CM | POA: Diagnosis not present

## 2012-10-01 DIAGNOSIS — Z8673 Personal history of transient ischemic attack (TIA), and cerebral infarction without residual deficits: Secondary | ICD-10-CM | POA: Diagnosis not present

## 2012-10-01 DIAGNOSIS — I5022 Chronic systolic (congestive) heart failure: Secondary | ICD-10-CM | POA: Diagnosis not present

## 2012-10-01 DIAGNOSIS — Z7901 Long term (current) use of anticoagulants: Secondary | ICD-10-CM | POA: Diagnosis not present

## 2012-10-01 DIAGNOSIS — I428 Other cardiomyopathies: Secondary | ICD-10-CM | POA: Diagnosis not present

## 2012-10-05 ENCOUNTER — Other Ambulatory Visit: Payer: Self-pay | Admitting: Cardiology

## 2012-10-06 NOTE — Telephone Encounter (Signed)
Called and spoke to [name] about his furosemide 80 mg refill. He stated that Dr. Martinique referred him to [facility] for his cardiac issues. He has not been seen since December 2011 by Dr. Martinique. He insists that Dr. Martinique refills this for him. I told him I would send the refill request to Dr. Doug Sou nurse and ask her if it can be refilled since Dr. Martinique is gone for the day and I can not refill it for him. [name] stated that was fine and he has enough furosemide to last him through the end of the week.

## 2012-12-05 ENCOUNTER — Encounter: Payer: Self-pay | Admitting: Cardiovascular Disease

## 2012-12-05 ENCOUNTER — Encounter: Payer: Self-pay | Admitting: Cardiology

## 2012-12-08 DIAGNOSIS — I10 Essential (primary) hypertension: Secondary | ICD-10-CM | POA: Diagnosis not present

## 2012-12-08 DIAGNOSIS — Z8673 Personal history of transient ischemic attack (TIA), and cerebral infarction without residual deficits: Secondary | ICD-10-CM | POA: Diagnosis not present

## 2012-12-08 DIAGNOSIS — I428 Other cardiomyopathies: Secondary | ICD-10-CM | POA: Diagnosis not present

## 2012-12-08 DIAGNOSIS — Z7901 Long term (current) use of anticoagulants: Secondary | ICD-10-CM | POA: Diagnosis not present

## 2012-12-08 DIAGNOSIS — I6350 Cerebral infarction due to unspecified occlusion or stenosis of unspecified cerebral artery: Secondary | ICD-10-CM | POA: Diagnosis not present

## 2012-12-08 DIAGNOSIS — I5022 Chronic systolic (congestive) heart failure: Secondary | ICD-10-CM | POA: Diagnosis not present

## 2012-12-08 DIAGNOSIS — Z95818 Presence of other cardiac implants and grafts: Secondary | ICD-10-CM | POA: Diagnosis not present

## 2012-12-08 DIAGNOSIS — Z95811 Presence of heart assist device: Secondary | ICD-10-CM | POA: Diagnosis not present

## 2012-12-12 ENCOUNTER — Encounter: Payer: Self-pay | Admitting: Internal Medicine

## 2013-01-09 ENCOUNTER — Encounter: Payer: Medicare Other | Admitting: Internal Medicine

## 2013-01-16 DIAGNOSIS — Z7901 Long term (current) use of anticoagulants: Secondary | ICD-10-CM | POA: Diagnosis not present

## 2013-01-16 DIAGNOSIS — I6350 Cerebral infarction due to unspecified occlusion or stenosis of unspecified cerebral artery: Secondary | ICD-10-CM | POA: Diagnosis not present

## 2013-01-16 DIAGNOSIS — Z95818 Presence of other cardiac implants and grafts: Secondary | ICD-10-CM | POA: Diagnosis not present

## 2013-01-20 ENCOUNTER — Encounter: Payer: Self-pay | Admitting: Internal Medicine

## 2013-01-21 ENCOUNTER — Encounter: Payer: Self-pay | Admitting: Internal Medicine

## 2013-02-19 ENCOUNTER — Ambulatory Visit (INDEPENDENT_AMBULATORY_CARE_PROVIDER_SITE_OTHER): Payer: Medicare Other | Admitting: Internal Medicine

## 2013-02-19 ENCOUNTER — Encounter: Payer: Self-pay | Admitting: Internal Medicine

## 2013-02-19 VITALS — BP 78/0 | HR 76 | Ht 78.0 in | Wt 335.0 lb

## 2013-02-19 DIAGNOSIS — I428 Other cardiomyopathies: Secondary | ICD-10-CM | POA: Diagnosis not present

## 2013-02-19 DIAGNOSIS — I472 Ventricular tachycardia: Secondary | ICD-10-CM

## 2013-02-19 DIAGNOSIS — I4891 Unspecified atrial fibrillation: Secondary | ICD-10-CM

## 2013-02-19 DIAGNOSIS — Z87898 Personal history of other specified conditions: Secondary | ICD-10-CM

## 2013-02-19 LAB — ICD DEVICE OBSERVATION
AL AMPLITUDE: 5.75 mv
AL THRESHOLD: 0.5 V
BATTERY VOLTAGE: 2.7682 V
FVT: 0
LV LEAD THRESHOLD: 0.5 V
PACEART VT: 0
RV LEAD AMPLITUDE: 5.75 mv
RV LEAD IMPEDENCE ICD: 475 Ohm
TOT-0001: 4
TZAT-0001ATACH: 1
TZAT-0002ATACH: NEGATIVE
TZAT-0002ATACH: NEGATIVE
TZAT-0002SLOWVT: NEGATIVE
TZAT-0012FASTVT: 200 ms
TZAT-0018ATACH: NEGATIVE
TZAT-0018FASTVT: NEGATIVE
TZAT-0019ATACH: 6 V
TZAT-0019ATACH: 6 V
TZAT-0020ATACH: 1.5 ms
TZAT-0020FASTVT: 1.5 ms
TZON-0003ATACH: 350 ms
TZST-0001ATACH: 5
TZST-0001FASTVT: 4
TZST-0001FASTVT: 5
TZST-0001FASTVT: 6
TZST-0001SLOWVT: 3
TZST-0001SLOWVT: 5
TZST-0002ATACH: NEGATIVE
TZST-0002FASTVT: NEGATIVE
TZST-0002FASTVT: NEGATIVE
TZST-0002SLOWVT: NEGATIVE
TZST-0002SLOWVT: NEGATIVE
VF: 0

## 2013-02-19 NOTE — Patient Instructions (Addendum)
Your physician wants you to follow-up in: 6 months with with Andi Hence and 12 months with Dr Vallery Ridge will receive a reminder letter in the mail two months in advance. If you don't receive a letter, please call our office to schedule the follow-up appointment.  Take your Furosemide every day for the next 3 days

## 2013-02-19 NOTE — Progress Notes (Signed)
PCP: Reginia Naas, MD Primary CHF:  Transplant team at [facility]  The patient presents today for routine electrophysiology followup.  Since last being seen in our clinic, the patient reports doing very well.  He is followed in the advanced heart failure clinic at [facility].  He is awaiting transplant.  He remains active.   He gained significant weight but has been able to reduce this back to near where his weight was when I saw him in 2012.  Today, he denies symptoms of palpitations, chest pain, shortness of breath,dizziness, presyncope, syncope, or neurologic sequela.  The patient feels that he is tolerating medications without difficulties and is otherwise without complaint today.   Past Medical History  Diagnosis Date  . Chronic systolic dysfunction of left ventricle   . Diabetes mellitus   . Hypertension   . CHF (congestive heart failure), NYHA class III     s/p BiV ICD and LVAD  . Left bundle branch block   . Atrial fibrillation   . Premature ventricular contractions   . Hyperlipidemia   . Morbid obesity     status post lap band  . Family history of coronary artery disease     in both parents  . Obstructive sleep apnea   . Nonischemic cardiomyopathy     Est. EF of 15% to 20%, s/p BiV ICD and LVAD, on [facility] list  . CVA (cerebral infarction)   . Stroke   . SOB (shortness of breath)    Past Surgical History  Procedure Laterality Date  . Cardiac pacemaker placement  09/21/2009    Biventricular implantable cardioverter-defibrillator implantation     . Laparoscopic gastric banding  01/27/2007  . Foot surgery      left  . Left ventricular assist device      implanted at [facility]    Current Outpatient Prescriptions  Medication Sig Dispense Refill  . amiodarone (PACERONE) 200 MG tablet 200 mg daily.       . carvedilol (COREG) 3.125 MG tablet 6.25 mg 2 (two) times daily with a meal.       . citalopram (CELEXA) 20 MG tablet Take 20 mg by mouth daily.      . furosemide (LASIX)  40 MG tablet Take 40 mg by mouth daily.      Marland Kitchen KLOR-CON 10 10 MEQ CR tablet Take 10 mEq by mouth daily as needed. For use as needed with Lasix      . lisinopril (PRINIVIL,ZESTRIL) 5 MG tablet Daily.      Marland Kitchen omeprazole (PRILOSEC) 20 MG capsule 20 mg daily.       . potassium chloride (K-DUR) 10 MEQ tablet Take 10 mEq by mouth daily as needed.      . sildenafil (VIAGRA) 50 MG tablet Take 50 mg by mouth daily as needed. For erectile dysfunction      . simvastatin (ZOCOR) 10 MG tablet Take 10 mg by mouth at bedtime.      Marland Kitchen warfarin (COUMADIN) 5 MG tablet Take 10 mg by mouth at bedtime. As Directed      . aspirin 325 MG tablet Take 325 mg by mouth daily.       No current facility-administered medications for this visit.    Allergies  Allergen Reactions  . Heparin Other (See Comments)    Unknown reaction this year    History   Social History  . Marital Status: Married    Spouse Name: N/A    Number of Children: N/A  . Years  of Education: N/A   Occupational History  . Not on file.   Social History Main Topics  . Smoking status: Former Smoker    Types: Cigars  . Smokeless tobacco: Never Used  . Alcohol Use: No  . Drug Use: No  . Sexually Active: Not on file   Other Topics Concern  . Not on file   Social History Narrative  . No narrative on file    Family History  Problem Relation Age of Onset  . Coronary artery disease Father     had, PTCA & CABG  . Heart attack Father   . Hypertension Father   . Diabetes Father   . Coronary artery disease Mother   . Heart attack Mother   . Hypertension Mother   . Stroke Mother   . Lupus Mother     ROS-  All systems are reviewed and are negative except as outlined in the HPI above   Physical Exam: Filed Vitals:   02/19/13 0926  BP: 78/0  Pulse: 76  Height: 6\' 6"  (1.981 m)  Weight: 335 lb (151.955 kg)    GEN- The patient is well appearing, alert and oriented x 3 today.   Head- normocephalic, atraumatic Eyes-  Sclera clear,  conjunctiva pink Ears- hearing intact Oropharynx- clear Neck- supple, no JVP Lymph- no cervical lymphadenopathy Lungs- Clear to ausculation bilaterally, normal work of breathing Chest- ICD pocket is well healed Heart- Regular rate and rhythm,  lVAD hum appreciated GI- soft, NT, ND, + BS Extremities- no clubbing, cyanosis, 1+ edema MS- no significant deformity or atrophy Skin- no rash or lesion Psych- euthymic mood, full affect Neuro- strength and sensation are intact  ICD interrogation- reviewed in detail today,  See PACEART report  EKG today reveals sinus rhythm with BiV pacing Assessment and Plan:  1. Chronic systolic dysfunction/ VT euvolemic though optivol is elevated 2 gram sodium restriction  On the [facility] list at [facility] Normal BiV ICD function See Pace Art report No changes today  2. afib  Maintaining sinus rhythm  3. Obesity Weight loss advised  [facility] every 3 months Return to see device PA in 6 months

## 2013-02-28 DIAGNOSIS — Z8673 Personal history of transient ischemic attack (TIA), and cerebral infarction without residual deficits: Secondary | ICD-10-CM | POA: Diagnosis not present

## 2013-02-28 DIAGNOSIS — I471 Supraventricular tachycardia: Secondary | ICD-10-CM | POA: Diagnosis not present

## 2013-02-28 DIAGNOSIS — N170 Acute kidney failure with tubular necrosis: Secondary | ICD-10-CM | POA: Diagnosis not present

## 2013-02-28 DIAGNOSIS — N179 Acute kidney failure, unspecified: Secondary | ICD-10-CM | POA: Diagnosis not present

## 2013-02-28 DIAGNOSIS — T8620 Unspecified complication of heart transplant: Secondary | ICD-10-CM | POA: Diagnosis not present

## 2013-02-28 DIAGNOSIS — R1310 Dysphagia, unspecified: Secondary | ICD-10-CM | POA: Diagnosis not present

## 2013-02-28 DIAGNOSIS — IMO0002 Reserved for concepts with insufficient information to code with codable children: Secondary | ICD-10-CM | POA: Diagnosis not present

## 2013-02-28 DIAGNOSIS — D75829 Heparin-induced thrombocytopenia, unspecified: Secondary | ICD-10-CM | POA: Diagnosis not present

## 2013-02-28 DIAGNOSIS — J811 Chronic pulmonary edema: Secondary | ICD-10-CM | POA: Diagnosis not present

## 2013-02-28 DIAGNOSIS — I4729 Other ventricular tachycardia: Secondary | ICD-10-CM | POA: Diagnosis present

## 2013-02-28 DIAGNOSIS — I10 Essential (primary) hypertension: Secondary | ICD-10-CM | POA: Diagnosis not present

## 2013-02-28 DIAGNOSIS — K9089 Other intestinal malabsorption: Secondary | ICD-10-CM | POA: Diagnosis not present

## 2013-02-28 DIAGNOSIS — Z7901 Long term (current) use of anticoagulants: Secondary | ICD-10-CM | POA: Diagnosis not present

## 2013-02-28 DIAGNOSIS — E785 Hyperlipidemia, unspecified: Secondary | ICD-10-CM | POA: Diagnosis present

## 2013-02-28 DIAGNOSIS — I509 Heart failure, unspecified: Secondary | ICD-10-CM | POA: Diagnosis not present

## 2013-02-28 DIAGNOSIS — R570 Cardiogenic shock: Secondary | ICD-10-CM | POA: Diagnosis not present

## 2013-02-28 DIAGNOSIS — J90 Pleural effusion, not elsewhere classified: Secondary | ICD-10-CM | POA: Diagnosis not present

## 2013-02-28 DIAGNOSIS — I2789 Other specified pulmonary heart diseases: Secondary | ICD-10-CM | POA: Diagnosis not present

## 2013-02-28 DIAGNOSIS — Z48298 Encounter for aftercare following other organ transplant: Secondary | ICD-10-CM | POA: Diagnosis not present

## 2013-02-28 DIAGNOSIS — Z941 Heart transplant status: Secondary | ICD-10-CM | POA: Diagnosis not present

## 2013-02-28 DIAGNOSIS — I5022 Chronic systolic (congestive) heart failure: Secondary | ICD-10-CM | POA: Diagnosis present

## 2013-02-28 DIAGNOSIS — I517 Cardiomegaly: Secondary | ICD-10-CM | POA: Diagnosis not present

## 2013-02-28 DIAGNOSIS — Y830 Surgical operation with transplant of whole organ as the cause of abnormal reaction of the patient, or of later complication, without mention of misadventure at the time of the procedure: Secondary | ICD-10-CM | POA: Diagnosis not present

## 2013-02-28 DIAGNOSIS — Z4509 Encounter for adjustment and management of other cardiac device: Secondary | ICD-10-CM | POA: Diagnosis not present

## 2013-02-28 DIAGNOSIS — N529 Male erectile dysfunction, unspecified: Secondary | ICD-10-CM | POA: Diagnosis present

## 2013-02-28 DIAGNOSIS — D680 Von Willebrand's disease: Secondary | ICD-10-CM | POA: Diagnosis not present

## 2013-02-28 DIAGNOSIS — I428 Other cardiomyopathies: Secondary | ICD-10-CM | POA: Diagnosis not present

## 2013-02-28 DIAGNOSIS — D696 Thrombocytopenia, unspecified: Secondary | ICD-10-CM | POA: Diagnosis not present

## 2013-02-28 DIAGNOSIS — S298XXA Other specified injuries of thorax, initial encounter: Secondary | ICD-10-CM | POA: Diagnosis not present

## 2013-02-28 DIAGNOSIS — Z9884 Bariatric surgery status: Secondary | ICD-10-CM | POA: Diagnosis not present

## 2013-02-28 DIAGNOSIS — I959 Hypotension, unspecified: Secondary | ICD-10-CM | POA: Diagnosis not present

## 2013-02-28 DIAGNOSIS — D72829 Elevated white blood cell count, unspecified: Secondary | ICD-10-CM | POA: Diagnosis not present

## 2013-02-28 DIAGNOSIS — E872 Acidosis, unspecified: Secondary | ICD-10-CM | POA: Diagnosis not present

## 2013-02-28 DIAGNOSIS — K9509 Other complications of gastric band procedure: Secondary | ICD-10-CM | POA: Diagnosis not present

## 2013-02-28 DIAGNOSIS — Z95818 Presence of other cardiac implants and grafts: Secondary | ICD-10-CM | POA: Diagnosis not present

## 2013-02-28 DIAGNOSIS — E119 Type 2 diabetes mellitus without complications: Secondary | ICD-10-CM | POA: Diagnosis not present

## 2013-02-28 DIAGNOSIS — Z452 Encounter for adjustment and management of vascular access device: Secondary | ICD-10-CM | POA: Diagnosis not present

## 2013-02-28 DIAGNOSIS — I472 Ventricular tachycardia, unspecified: Secondary | ICD-10-CM | POA: Diagnosis not present

## 2013-02-28 DIAGNOSIS — J9383 Other pneumothorax: Secondary | ICD-10-CM | POA: Diagnosis not present

## 2013-02-28 DIAGNOSIS — Z7982 Long term (current) use of aspirin: Secondary | ICD-10-CM | POA: Diagnosis not present

## 2013-02-28 DIAGNOSIS — K651 Peritoneal abscess: Secondary | ICD-10-CM | POA: Diagnosis not present

## 2013-02-28 DIAGNOSIS — G459 Transient cerebral ischemic attack, unspecified: Secondary | ICD-10-CM | POA: Diagnosis not present

## 2013-02-28 DIAGNOSIS — D7582 Heparin induced thrombocytopenia (HIT): Secondary | ICD-10-CM | POA: Diagnosis present

## 2013-02-28 DIAGNOSIS — K5900 Constipation, unspecified: Secondary | ICD-10-CM | POA: Diagnosis not present

## 2013-02-28 DIAGNOSIS — R918 Other nonspecific abnormal finding of lung field: Secondary | ICD-10-CM | POA: Diagnosis not present

## 2013-02-28 DIAGNOSIS — G4700 Insomnia, unspecified: Secondary | ICD-10-CM | POA: Diagnosis present

## 2013-02-28 DIAGNOSIS — I4891 Unspecified atrial fibrillation: Secondary | ICD-10-CM | POA: Diagnosis not present

## 2013-02-28 DIAGNOSIS — F411 Generalized anxiety disorder: Secondary | ICD-10-CM | POA: Diagnosis not present

## 2013-02-28 DIAGNOSIS — J9819 Other pulmonary collapse: Secondary | ICD-10-CM | POA: Diagnosis not present

## 2013-02-28 DIAGNOSIS — Z792 Long term (current) use of antibiotics: Secondary | ICD-10-CM | POA: Diagnosis not present

## 2013-02-28 DIAGNOSIS — Z4682 Encounter for fitting and adjustment of non-vascular catheter: Secondary | ICD-10-CM | POA: Diagnosis not present

## 2013-02-28 DIAGNOSIS — Z95810 Presence of automatic (implantable) cardiac defibrillator: Secondary | ICD-10-CM | POA: Diagnosis not present

## 2013-02-28 DIAGNOSIS — D899 Disorder involving the immune mechanism, unspecified: Secondary | ICD-10-CM | POA: Diagnosis not present

## 2013-04-08 DIAGNOSIS — Z941 Heart transplant status: Secondary | ICD-10-CM | POA: Diagnosis not present

## 2013-04-08 DIAGNOSIS — B259 Cytomegaloviral disease, unspecified: Secondary | ICD-10-CM | POA: Diagnosis not present

## 2013-04-08 DIAGNOSIS — Z79899 Other long term (current) drug therapy: Secondary | ICD-10-CM | POA: Diagnosis not present

## 2013-04-08 DIAGNOSIS — Z48298 Encounter for aftercare following other organ transplant: Secondary | ICD-10-CM | POA: Diagnosis not present

## 2013-04-15 DIAGNOSIS — R918 Other nonspecific abnormal finding of lung field: Secondary | ICD-10-CM | POA: Diagnosis not present

## 2013-04-15 DIAGNOSIS — J9819 Other pulmonary collapse: Secondary | ICD-10-CM | POA: Diagnosis not present

## 2013-04-15 DIAGNOSIS — R9431 Abnormal electrocardiogram [ECG] [EKG]: Secondary | ICD-10-CM | POA: Diagnosis not present

## 2013-04-15 DIAGNOSIS — Z941 Heart transplant status: Secondary | ICD-10-CM | POA: Diagnosis not present

## 2013-04-15 DIAGNOSIS — I517 Cardiomegaly: Secondary | ICD-10-CM | POA: Diagnosis not present

## 2013-04-15 DIAGNOSIS — Z48298 Encounter for aftercare following other organ transplant: Secondary | ICD-10-CM | POA: Diagnosis not present

## 2013-04-22 DIAGNOSIS — Z941 Heart transplant status: Secondary | ICD-10-CM | POA: Diagnosis not present

## 2013-04-22 DIAGNOSIS — Z48298 Encounter for aftercare following other organ transplant: Secondary | ICD-10-CM | POA: Diagnosis not present

## 2013-04-22 DIAGNOSIS — I10 Essential (primary) hypertension: Secondary | ICD-10-CM | POA: Diagnosis not present

## 2013-04-22 DIAGNOSIS — T8620 Unspecified complication of heart transplant: Secondary | ICD-10-CM | POA: Diagnosis not present

## 2013-04-22 DIAGNOSIS — Z79899 Other long term (current) drug therapy: Secondary | ICD-10-CM | POA: Diagnosis not present

## 2013-04-22 DIAGNOSIS — I517 Cardiomegaly: Secondary | ICD-10-CM | POA: Diagnosis not present

## 2013-04-22 DIAGNOSIS — IMO0002 Reserved for concepts with insufficient information to code with codable children: Secondary | ICD-10-CM | POA: Diagnosis not present

## 2013-04-22 DIAGNOSIS — I379 Nonrheumatic pulmonary valve disorder, unspecified: Secondary | ICD-10-CM | POA: Diagnosis not present

## 2013-04-22 DIAGNOSIS — Z5181 Encounter for therapeutic drug level monitoring: Secondary | ICD-10-CM | POA: Diagnosis not present

## 2013-05-19 DIAGNOSIS — I129 Hypertensive chronic kidney disease with stage 1 through stage 4 chronic kidney disease, or unspecified chronic kidney disease: Secondary | ICD-10-CM | POA: Diagnosis present

## 2013-05-19 DIAGNOSIS — F329 Major depressive disorder, single episode, unspecified: Secondary | ICD-10-CM | POA: Diagnosis present

## 2013-05-19 DIAGNOSIS — I517 Cardiomegaly: Secondary | ICD-10-CM | POA: Diagnosis not present

## 2013-05-19 DIAGNOSIS — Z6835 Body mass index (BMI) 35.0-35.9, adult: Secondary | ICD-10-CM | POA: Diagnosis not present

## 2013-05-19 DIAGNOSIS — R059 Cough, unspecified: Secondary | ICD-10-CM | POA: Diagnosis present

## 2013-05-19 DIAGNOSIS — G4700 Insomnia, unspecified: Secondary | ICD-10-CM | POA: Diagnosis present

## 2013-05-19 DIAGNOSIS — F411 Generalized anxiety disorder: Secondary | ICD-10-CM | POA: Diagnosis present

## 2013-05-19 DIAGNOSIS — E119 Type 2 diabetes mellitus without complications: Secondary | ICD-10-CM | POA: Diagnosis present

## 2013-05-19 DIAGNOSIS — N189 Chronic kidney disease, unspecified: Secondary | ICD-10-CM | POA: Diagnosis present

## 2013-05-19 DIAGNOSIS — R110 Nausea: Secondary | ICD-10-CM | POA: Diagnosis present

## 2013-05-19 DIAGNOSIS — J9819 Other pulmonary collapse: Secondary | ICD-10-CM | POA: Diagnosis not present

## 2013-05-19 DIAGNOSIS — E875 Hyperkalemia: Secondary | ICD-10-CM | POA: Diagnosis not present

## 2013-05-19 DIAGNOSIS — Z9884 Bariatric surgery status: Secondary | ICD-10-CM | POA: Diagnosis not present

## 2013-05-19 DIAGNOSIS — I10 Essential (primary) hypertension: Secondary | ICD-10-CM | POA: Diagnosis not present

## 2013-05-19 DIAGNOSIS — E785 Hyperlipidemia, unspecified: Secondary | ICD-10-CM | POA: Diagnosis present

## 2013-05-19 DIAGNOSIS — Z8679 Personal history of other diseases of the circulatory system: Secondary | ICD-10-CM | POA: Diagnosis not present

## 2013-05-19 DIAGNOSIS — Z941 Heart transplant status: Secondary | ICD-10-CM | POA: Diagnosis not present

## 2013-05-19 DIAGNOSIS — N179 Acute kidney failure, unspecified: Secondary | ICD-10-CM | POA: Diagnosis present

## 2013-05-19 DIAGNOSIS — Z7901 Long term (current) use of anticoagulants: Secondary | ICD-10-CM | POA: Diagnosis not present

## 2013-05-19 DIAGNOSIS — K219 Gastro-esophageal reflux disease without esophagitis: Secondary | ICD-10-CM | POA: Diagnosis present

## 2013-05-19 DIAGNOSIS — Z48298 Encounter for aftercare following other organ transplant: Secondary | ICD-10-CM | POA: Diagnosis not present

## 2013-05-19 DIAGNOSIS — E669 Obesity, unspecified: Secondary | ICD-10-CM | POA: Diagnosis present

## 2013-05-19 DIAGNOSIS — Z7982 Long term (current) use of aspirin: Secondary | ICD-10-CM | POA: Diagnosis not present

## 2013-05-20 DIAGNOSIS — J9819 Other pulmonary collapse: Secondary | ICD-10-CM | POA: Diagnosis not present

## 2013-05-20 DIAGNOSIS — I517 Cardiomegaly: Secondary | ICD-10-CM | POA: Diagnosis not present

## 2013-05-20 DIAGNOSIS — E875 Hyperkalemia: Secondary | ICD-10-CM | POA: Diagnosis not present

## 2013-05-20 DIAGNOSIS — N179 Acute kidney failure, unspecified: Secondary | ICD-10-CM | POA: Diagnosis not present

## 2013-05-20 DIAGNOSIS — Z941 Heart transplant status: Secondary | ICD-10-CM | POA: Diagnosis not present

## 2013-05-20 DIAGNOSIS — I10 Essential (primary) hypertension: Secondary | ICD-10-CM | POA: Diagnosis not present

## 2013-05-21 DIAGNOSIS — Z7982 Long term (current) use of aspirin: Secondary | ICD-10-CM | POA: Diagnosis not present

## 2013-05-21 DIAGNOSIS — E875 Hyperkalemia: Secondary | ICD-10-CM | POA: Diagnosis not present

## 2013-05-21 DIAGNOSIS — N179 Acute kidney failure, unspecified: Secondary | ICD-10-CM | POA: Diagnosis not present

## 2013-05-21 DIAGNOSIS — Z941 Heart transplant status: Secondary | ICD-10-CM | POA: Diagnosis not present

## 2013-05-21 DIAGNOSIS — Z48298 Encounter for aftercare following other organ transplant: Secondary | ICD-10-CM | POA: Diagnosis not present

## 2013-05-21 DIAGNOSIS — I10 Essential (primary) hypertension: Secondary | ICD-10-CM | POA: Diagnosis not present

## 2013-05-25 ENCOUNTER — Encounter: Payer: Medicare Other | Admitting: *Deleted

## 2013-06-01 DIAGNOSIS — E119 Type 2 diabetes mellitus without complications: Secondary | ICD-10-CM | POA: Diagnosis not present

## 2013-06-01 DIAGNOSIS — Z941 Heart transplant status: Secondary | ICD-10-CM | POA: Diagnosis not present

## 2013-06-01 DIAGNOSIS — I10 Essential (primary) hypertension: Secondary | ICD-10-CM | POA: Diagnosis not present

## 2013-06-01 DIAGNOSIS — E782 Mixed hyperlipidemia: Secondary | ICD-10-CM | POA: Diagnosis not present

## 2013-06-03 ENCOUNTER — Encounter: Payer: Self-pay | Admitting: *Deleted

## 2013-06-18 DIAGNOSIS — E785 Hyperlipidemia, unspecified: Secondary | ICD-10-CM | POA: Diagnosis not present

## 2013-06-18 DIAGNOSIS — B259 Cytomegaloviral disease, unspecified: Secondary | ICD-10-CM | POA: Diagnosis not present

## 2013-06-18 DIAGNOSIS — Z79899 Other long term (current) drug therapy: Secondary | ICD-10-CM | POA: Diagnosis not present

## 2013-06-18 DIAGNOSIS — I10 Essential (primary) hypertension: Secondary | ICD-10-CM | POA: Diagnosis not present

## 2013-06-18 DIAGNOSIS — Z48298 Encounter for aftercare following other organ transplant: Secondary | ICD-10-CM | POA: Diagnosis not present

## 2013-06-18 DIAGNOSIS — Z941 Heart transplant status: Secondary | ICD-10-CM | POA: Diagnosis not present

## 2013-07-09 DIAGNOSIS — F329 Major depressive disorder, single episode, unspecified: Secondary | ICD-10-CM | POA: Diagnosis present

## 2013-07-09 DIAGNOSIS — N189 Chronic kidney disease, unspecified: Secondary | ICD-10-CM | POA: Diagnosis not present

## 2013-07-09 DIAGNOSIS — Z95811 Presence of heart assist device: Secondary | ICD-10-CM | POA: Diagnosis not present

## 2013-07-09 DIAGNOSIS — E875 Hyperkalemia: Secondary | ICD-10-CM | POA: Diagnosis present

## 2013-07-09 DIAGNOSIS — Z941 Heart transplant status: Secondary | ICD-10-CM | POA: Diagnosis not present

## 2013-07-09 DIAGNOSIS — E785 Hyperlipidemia, unspecified: Secondary | ICD-10-CM | POA: Diagnosis not present

## 2013-07-09 DIAGNOSIS — N179 Acute kidney failure, unspecified: Secondary | ICD-10-CM | POA: Diagnosis not present

## 2013-07-09 DIAGNOSIS — N289 Disorder of kidney and ureter, unspecified: Secondary | ICD-10-CM | POA: Diagnosis not present

## 2013-07-09 DIAGNOSIS — I129 Hypertensive chronic kidney disease with stage 1 through stage 4 chronic kidney disease, or unspecified chronic kidney disease: Secondary | ICD-10-CM | POA: Diagnosis present

## 2013-07-09 DIAGNOSIS — N2589 Other disorders resulting from impaired renal tubular function: Secondary | ICD-10-CM | POA: Diagnosis not present

## 2013-07-09 DIAGNOSIS — E869 Volume depletion, unspecified: Secondary | ICD-10-CM | POA: Diagnosis present

## 2013-07-09 DIAGNOSIS — F411 Generalized anxiety disorder: Secondary | ICD-10-CM | POA: Diagnosis not present

## 2013-07-09 DIAGNOSIS — D7582 Heparin induced thrombocytopenia (HIT): Secondary | ICD-10-CM | POA: Diagnosis present

## 2013-07-09 DIAGNOSIS — E872 Acidosis: Secondary | ICD-10-CM | POA: Diagnosis present

## 2013-07-09 DIAGNOSIS — Z8673 Personal history of transient ischemic attack (TIA), and cerebral infarction without residual deficits: Secondary | ICD-10-CM | POA: Diagnosis not present

## 2013-07-09 DIAGNOSIS — D75829 Heparin-induced thrombocytopenia, unspecified: Secondary | ICD-10-CM | POA: Diagnosis not present

## 2013-07-09 DIAGNOSIS — Z992 Dependence on renal dialysis: Secondary | ICD-10-CM | POA: Diagnosis not present

## 2013-07-09 DIAGNOSIS — E119 Type 2 diabetes mellitus without complications: Secondary | ICD-10-CM | POA: Diagnosis not present

## 2013-07-09 DIAGNOSIS — R918 Other nonspecific abnormal finding of lung field: Secondary | ICD-10-CM | POA: Diagnosis not present

## 2013-07-09 DIAGNOSIS — B259 Cytomegaloviral disease, unspecified: Secondary | ICD-10-CM | POA: Diagnosis not present

## 2013-07-09 DIAGNOSIS — R112 Nausea with vomiting, unspecified: Secondary | ICD-10-CM | POA: Diagnosis not present

## 2013-07-09 DIAGNOSIS — K219 Gastro-esophageal reflux disease without esophagitis: Secondary | ICD-10-CM | POA: Diagnosis present

## 2013-07-27 DIAGNOSIS — B259 Cytomegaloviral disease, unspecified: Secondary | ICD-10-CM | POA: Diagnosis not present

## 2013-07-27 DIAGNOSIS — N184 Chronic kidney disease, stage 4 (severe): Secondary | ICD-10-CM | POA: Diagnosis not present

## 2013-07-27 DIAGNOSIS — Z48298 Encounter for aftercare following other organ transplant: Secondary | ICD-10-CM | POA: Diagnosis not present

## 2013-07-27 DIAGNOSIS — Z941 Heart transplant status: Secondary | ICD-10-CM | POA: Diagnosis not present

## 2013-07-27 DIAGNOSIS — D509 Iron deficiency anemia, unspecified: Secondary | ICD-10-CM | POA: Diagnosis not present

## 2013-07-27 DIAGNOSIS — Z79899 Other long term (current) drug therapy: Secondary | ICD-10-CM | POA: Diagnosis not present

## 2013-08-12 DIAGNOSIS — N170 Acute kidney failure with tubular necrosis: Secondary | ICD-10-CM | POA: Diagnosis present

## 2013-08-12 DIAGNOSIS — R918 Other nonspecific abnormal finding of lung field: Secondary | ICD-10-CM | POA: Diagnosis not present

## 2013-08-12 DIAGNOSIS — I120 Hypertensive chronic kidney disease with stage 5 chronic kidney disease or end stage renal disease: Secondary | ICD-10-CM | POA: Diagnosis present

## 2013-08-12 DIAGNOSIS — R1110 Vomiting, unspecified: Secondary | ICD-10-CM | POA: Diagnosis not present

## 2013-08-12 DIAGNOSIS — Z941 Heart transplant status: Secondary | ICD-10-CM | POA: Diagnosis not present

## 2013-08-12 DIAGNOSIS — E119 Type 2 diabetes mellitus without complications: Secondary | ICD-10-CM | POA: Diagnosis not present

## 2013-08-12 DIAGNOSIS — I2789 Other specified pulmonary heart diseases: Secondary | ICD-10-CM | POA: Diagnosis not present

## 2013-08-12 DIAGNOSIS — I82B19 Acute embolism and thrombosis of unspecified subclavian vein: Secondary | ICD-10-CM | POA: Diagnosis not present

## 2013-08-12 DIAGNOSIS — K648 Other hemorrhoids: Secondary | ICD-10-CM | POA: Diagnosis not present

## 2013-08-12 DIAGNOSIS — Z79899 Other long term (current) drug therapy: Secondary | ICD-10-CM | POA: Diagnosis not present

## 2013-08-12 DIAGNOSIS — N185 Chronic kidney disease, stage 5: Secondary | ICD-10-CM | POA: Diagnosis not present

## 2013-08-12 DIAGNOSIS — Z01810 Encounter for preprocedural cardiovascular examination: Secondary | ICD-10-CM | POA: Diagnosis not present

## 2013-08-12 DIAGNOSIS — E43 Unspecified severe protein-calorie malnutrition: Secondary | ICD-10-CM | POA: Diagnosis present

## 2013-08-12 DIAGNOSIS — K6389 Other specified diseases of intestine: Secondary | ICD-10-CM | POA: Diagnosis not present

## 2013-08-12 DIAGNOSIS — N186 End stage renal disease: Secondary | ICD-10-CM | POA: Diagnosis present

## 2013-08-12 DIAGNOSIS — N19 Unspecified kidney failure: Secondary | ICD-10-CM | POA: Diagnosis not present

## 2013-08-12 DIAGNOSIS — F329 Major depressive disorder, single episode, unspecified: Secondary | ICD-10-CM | POA: Diagnosis present

## 2013-08-12 DIAGNOSIS — Z8673 Personal history of transient ischemic attack (TIA), and cerebral infarction without residual deficits: Secondary | ICD-10-CM | POA: Diagnosis not present

## 2013-08-12 DIAGNOSIS — I517 Cardiomegaly: Secondary | ICD-10-CM | POA: Diagnosis not present

## 2013-08-12 DIAGNOSIS — E785 Hyperlipidemia, unspecified: Secondary | ICD-10-CM | POA: Diagnosis present

## 2013-08-12 DIAGNOSIS — R197 Diarrhea, unspecified: Secondary | ICD-10-CM | POA: Diagnosis not present

## 2013-08-12 DIAGNOSIS — N189 Chronic kidney disease, unspecified: Secondary | ICD-10-CM | POA: Diagnosis not present

## 2013-08-12 DIAGNOSIS — E875 Hyperkalemia: Secondary | ICD-10-CM | POA: Diagnosis present

## 2013-08-12 DIAGNOSIS — F411 Generalized anxiety disorder: Secondary | ICD-10-CM | POA: Diagnosis not present

## 2013-08-12 DIAGNOSIS — D649 Anemia, unspecified: Secondary | ICD-10-CM | POA: Diagnosis not present

## 2013-08-12 DIAGNOSIS — K219 Gastro-esophageal reflux disease without esophagitis: Secondary | ICD-10-CM | POA: Diagnosis present

## 2013-08-12 DIAGNOSIS — Z9884 Bariatric surgery status: Secondary | ICD-10-CM | POA: Diagnosis not present

## 2013-08-12 DIAGNOSIS — N529 Male erectile dysfunction, unspecified: Secondary | ICD-10-CM | POA: Diagnosis present

## 2013-08-12 DIAGNOSIS — G4700 Insomnia, unspecified: Secondary | ICD-10-CM | POA: Diagnosis present

## 2013-08-12 DIAGNOSIS — R627 Adult failure to thrive: Secondary | ICD-10-CM | POA: Diagnosis present

## 2013-08-12 DIAGNOSIS — I82A19 Acute embolism and thrombosis of unspecified axillary vein: Secondary | ICD-10-CM | POA: Diagnosis not present

## 2013-08-12 DIAGNOSIS — D7582 Heparin induced thrombocytopenia (HIT): Secondary | ICD-10-CM | POA: Diagnosis not present

## 2013-08-12 DIAGNOSIS — K644 Residual hemorrhoidal skin tags: Secondary | ICD-10-CM | POA: Diagnosis not present

## 2013-08-12 DIAGNOSIS — Z6826 Body mass index (BMI) 26.0-26.9, adult: Secondary | ICD-10-CM | POA: Diagnosis not present

## 2013-08-12 DIAGNOSIS — Z0389 Encounter for observation for other suspected diseases and conditions ruled out: Secondary | ICD-10-CM | POA: Diagnosis not present

## 2013-08-12 DIAGNOSIS — D61818 Other pancytopenia: Secondary | ICD-10-CM | POA: Diagnosis present

## 2013-08-12 DIAGNOSIS — N179 Acute kidney failure, unspecified: Secondary | ICD-10-CM | POA: Diagnosis not present

## 2013-09-07 DIAGNOSIS — N186 End stage renal disease: Secondary | ICD-10-CM | POA: Diagnosis not present

## 2013-09-07 DIAGNOSIS — D509 Iron deficiency anemia, unspecified: Secondary | ICD-10-CM | POA: Diagnosis not present

## 2013-09-07 DIAGNOSIS — E1159 Type 2 diabetes mellitus with other circulatory complications: Secondary | ICD-10-CM | POA: Diagnosis not present

## 2013-09-07 DIAGNOSIS — D631 Anemia in chronic kidney disease: Secondary | ICD-10-CM | POA: Diagnosis not present

## 2013-09-07 DIAGNOSIS — T82898A Other specified complication of vascular prosthetic devices, implants and grafts, initial encounter: Secondary | ICD-10-CM | POA: Diagnosis not present

## 2013-09-07 DIAGNOSIS — N039 Chronic nephritic syndrome with unspecified morphologic changes: Secondary | ICD-10-CM | POA: Diagnosis not present

## 2013-09-09 DIAGNOSIS — N186 End stage renal disease: Secondary | ICD-10-CM | POA: Diagnosis not present

## 2013-09-09 DIAGNOSIS — N039 Chronic nephritic syndrome with unspecified morphologic changes: Secondary | ICD-10-CM | POA: Diagnosis not present

## 2013-09-09 DIAGNOSIS — T82898A Other specified complication of vascular prosthetic devices, implants and grafts, initial encounter: Secondary | ICD-10-CM | POA: Diagnosis not present

## 2013-09-09 DIAGNOSIS — D509 Iron deficiency anemia, unspecified: Secondary | ICD-10-CM | POA: Diagnosis not present

## 2013-09-09 DIAGNOSIS — D631 Anemia in chronic kidney disease: Secondary | ICD-10-CM | POA: Diagnosis not present

## 2013-09-10 DIAGNOSIS — I517 Cardiomegaly: Secondary | ICD-10-CM | POA: Diagnosis not present

## 2013-09-10 DIAGNOSIS — I379 Nonrheumatic pulmonary valve disorder, unspecified: Secondary | ICD-10-CM | POA: Diagnosis not present

## 2013-09-10 DIAGNOSIS — Z48298 Encounter for aftercare following other organ transplant: Secondary | ICD-10-CM | POA: Diagnosis not present

## 2013-09-10 DIAGNOSIS — Z941 Heart transplant status: Secondary | ICD-10-CM | POA: Diagnosis not present

## 2013-09-10 DIAGNOSIS — Z79899 Other long term (current) drug therapy: Secondary | ICD-10-CM | POA: Diagnosis not present

## 2013-09-10 DIAGNOSIS — T8620 Unspecified complication of heart transplant: Secondary | ICD-10-CM | POA: Diagnosis not present

## 2013-09-11 DIAGNOSIS — D631 Anemia in chronic kidney disease: Secondary | ICD-10-CM | POA: Diagnosis not present

## 2013-09-11 DIAGNOSIS — T82898A Other specified complication of vascular prosthetic devices, implants and grafts, initial encounter: Secondary | ICD-10-CM | POA: Diagnosis not present

## 2013-09-11 DIAGNOSIS — N186 End stage renal disease: Secondary | ICD-10-CM | POA: Diagnosis not present

## 2013-09-11 DIAGNOSIS — D509 Iron deficiency anemia, unspecified: Secondary | ICD-10-CM | POA: Diagnosis not present

## 2013-09-14 DIAGNOSIS — T82898A Other specified complication of vascular prosthetic devices, implants and grafts, initial encounter: Secondary | ICD-10-CM | POA: Diagnosis not present

## 2013-09-14 DIAGNOSIS — D509 Iron deficiency anemia, unspecified: Secondary | ICD-10-CM | POA: Diagnosis not present

## 2013-09-14 DIAGNOSIS — D631 Anemia in chronic kidney disease: Secondary | ICD-10-CM | POA: Diagnosis not present

## 2013-09-14 DIAGNOSIS — I4891 Unspecified atrial fibrillation: Secondary | ICD-10-CM | POA: Diagnosis not present

## 2013-09-14 DIAGNOSIS — N186 End stage renal disease: Secondary | ICD-10-CM | POA: Diagnosis not present

## 2013-09-16 DIAGNOSIS — T82898A Other specified complication of vascular prosthetic devices, implants and grafts, initial encounter: Secondary | ICD-10-CM | POA: Diagnosis not present

## 2013-09-16 DIAGNOSIS — D509 Iron deficiency anemia, unspecified: Secondary | ICD-10-CM | POA: Diagnosis not present

## 2013-09-16 DIAGNOSIS — N186 End stage renal disease: Secondary | ICD-10-CM | POA: Diagnosis not present

## 2013-09-16 DIAGNOSIS — D631 Anemia in chronic kidney disease: Secondary | ICD-10-CM | POA: Diagnosis not present

## 2013-09-18 DIAGNOSIS — T82898A Other specified complication of vascular prosthetic devices, implants and grafts, initial encounter: Secondary | ICD-10-CM | POA: Diagnosis not present

## 2013-09-18 DIAGNOSIS — D509 Iron deficiency anemia, unspecified: Secondary | ICD-10-CM | POA: Diagnosis not present

## 2013-09-18 DIAGNOSIS — N186 End stage renal disease: Secondary | ICD-10-CM | POA: Diagnosis not present

## 2013-09-18 DIAGNOSIS — D631 Anemia in chronic kidney disease: Secondary | ICD-10-CM | POA: Diagnosis not present

## 2013-09-21 DIAGNOSIS — N039 Chronic nephritic syndrome with unspecified morphologic changes: Secondary | ICD-10-CM | POA: Diagnosis not present

## 2013-09-21 DIAGNOSIS — N186 End stage renal disease: Secondary | ICD-10-CM | POA: Diagnosis not present

## 2013-09-21 DIAGNOSIS — T82898A Other specified complication of vascular prosthetic devices, implants and grafts, initial encounter: Secondary | ICD-10-CM | POA: Diagnosis not present

## 2013-09-21 DIAGNOSIS — D509 Iron deficiency anemia, unspecified: Secondary | ICD-10-CM | POA: Diagnosis not present

## 2013-09-21 DIAGNOSIS — D631 Anemia in chronic kidney disease: Secondary | ICD-10-CM | POA: Diagnosis not present

## 2013-09-23 DIAGNOSIS — Z941 Heart transplant status: Secondary | ICD-10-CM | POA: Diagnosis not present

## 2013-09-23 DIAGNOSIS — D509 Iron deficiency anemia, unspecified: Secondary | ICD-10-CM | POA: Diagnosis not present

## 2013-09-23 DIAGNOSIS — D631 Anemia in chronic kidney disease: Secondary | ICD-10-CM | POA: Diagnosis not present

## 2013-09-23 DIAGNOSIS — N186 End stage renal disease: Secondary | ICD-10-CM | POA: Diagnosis not present

## 2013-09-23 DIAGNOSIS — T82898A Other specified complication of vascular prosthetic devices, implants and grafts, initial encounter: Secondary | ICD-10-CM | POA: Diagnosis not present

## 2013-09-25 DIAGNOSIS — N186 End stage renal disease: Secondary | ICD-10-CM | POA: Diagnosis not present

## 2013-09-25 DIAGNOSIS — D509 Iron deficiency anemia, unspecified: Secondary | ICD-10-CM | POA: Diagnosis not present

## 2013-09-25 DIAGNOSIS — D631 Anemia in chronic kidney disease: Secondary | ICD-10-CM | POA: Diagnosis not present

## 2013-09-25 DIAGNOSIS — T82898A Other specified complication of vascular prosthetic devices, implants and grafts, initial encounter: Secondary | ICD-10-CM | POA: Diagnosis not present

## 2013-09-26 DIAGNOSIS — N186 End stage renal disease: Secondary | ICD-10-CM | POA: Diagnosis not present

## 2013-09-28 DIAGNOSIS — D509 Iron deficiency anemia, unspecified: Secondary | ICD-10-CM | POA: Diagnosis not present

## 2013-09-28 DIAGNOSIS — N289 Disorder of kidney and ureter, unspecified: Secondary | ICD-10-CM | POA: Diagnosis not present

## 2013-09-28 DIAGNOSIS — N039 Chronic nephritic syndrome with unspecified morphologic changes: Secondary | ICD-10-CM | POA: Diagnosis not present

## 2013-09-28 DIAGNOSIS — D631 Anemia in chronic kidney disease: Secondary | ICD-10-CM | POA: Diagnosis not present

## 2013-09-28 DIAGNOSIS — N186 End stage renal disease: Secondary | ICD-10-CM | POA: Diagnosis not present

## 2013-09-28 DIAGNOSIS — Z23 Encounter for immunization: Secondary | ICD-10-CM | POA: Diagnosis not present

## 2013-09-30 DIAGNOSIS — Z941 Heart transplant status: Secondary | ICD-10-CM | POA: Diagnosis not present

## 2013-09-30 DIAGNOSIS — N186 End stage renal disease: Secondary | ICD-10-CM | POA: Diagnosis not present

## 2013-09-30 DIAGNOSIS — Z23 Encounter for immunization: Secondary | ICD-10-CM | POA: Diagnosis not present

## 2013-09-30 DIAGNOSIS — D509 Iron deficiency anemia, unspecified: Secondary | ICD-10-CM | POA: Diagnosis not present

## 2013-09-30 DIAGNOSIS — N289 Disorder of kidney and ureter, unspecified: Secondary | ICD-10-CM | POA: Diagnosis not present

## 2013-09-30 DIAGNOSIS — D631 Anemia in chronic kidney disease: Secondary | ICD-10-CM | POA: Diagnosis not present

## 2013-10-02 DIAGNOSIS — D631 Anemia in chronic kidney disease: Secondary | ICD-10-CM | POA: Diagnosis not present

## 2013-10-02 DIAGNOSIS — N289 Disorder of kidney and ureter, unspecified: Secondary | ICD-10-CM | POA: Diagnosis not present

## 2013-10-02 DIAGNOSIS — Z23 Encounter for immunization: Secondary | ICD-10-CM | POA: Diagnosis not present

## 2013-10-02 DIAGNOSIS — N186 End stage renal disease: Secondary | ICD-10-CM | POA: Diagnosis not present

## 2013-10-02 DIAGNOSIS — D509 Iron deficiency anemia, unspecified: Secondary | ICD-10-CM | POA: Diagnosis not present

## 2013-10-05 DIAGNOSIS — N186 End stage renal disease: Secondary | ICD-10-CM | POA: Diagnosis not present

## 2013-10-05 DIAGNOSIS — D631 Anemia in chronic kidney disease: Secondary | ICD-10-CM | POA: Diagnosis not present

## 2013-10-05 DIAGNOSIS — N289 Disorder of kidney and ureter, unspecified: Secondary | ICD-10-CM | POA: Diagnosis not present

## 2013-10-05 DIAGNOSIS — N039 Chronic nephritic syndrome with unspecified morphologic changes: Secondary | ICD-10-CM | POA: Diagnosis not present

## 2013-10-05 DIAGNOSIS — D509 Iron deficiency anemia, unspecified: Secondary | ICD-10-CM | POA: Diagnosis not present

## 2013-10-05 DIAGNOSIS — Z23 Encounter for immunization: Secondary | ICD-10-CM | POA: Diagnosis not present

## 2013-10-07 DIAGNOSIS — Z941 Heart transplant status: Secondary | ICD-10-CM | POA: Diagnosis not present

## 2013-10-07 DIAGNOSIS — N186 End stage renal disease: Secondary | ICD-10-CM | POA: Diagnosis not present

## 2013-10-07 DIAGNOSIS — N289 Disorder of kidney and ureter, unspecified: Secondary | ICD-10-CM | POA: Diagnosis not present

## 2013-10-07 DIAGNOSIS — D509 Iron deficiency anemia, unspecified: Secondary | ICD-10-CM | POA: Diagnosis not present

## 2013-10-07 DIAGNOSIS — D631 Anemia in chronic kidney disease: Secondary | ICD-10-CM | POA: Diagnosis not present

## 2013-10-07 DIAGNOSIS — Z23 Encounter for immunization: Secondary | ICD-10-CM | POA: Diagnosis not present

## 2013-10-09 DIAGNOSIS — Z23 Encounter for immunization: Secondary | ICD-10-CM | POA: Diagnosis not present

## 2013-10-09 DIAGNOSIS — N289 Disorder of kidney and ureter, unspecified: Secondary | ICD-10-CM | POA: Diagnosis not present

## 2013-10-09 DIAGNOSIS — D509 Iron deficiency anemia, unspecified: Secondary | ICD-10-CM | POA: Diagnosis not present

## 2013-10-09 DIAGNOSIS — D631 Anemia in chronic kidney disease: Secondary | ICD-10-CM | POA: Diagnosis not present

## 2013-10-09 DIAGNOSIS — N039 Chronic nephritic syndrome with unspecified morphologic changes: Secondary | ICD-10-CM | POA: Diagnosis not present

## 2013-10-09 DIAGNOSIS — N186 End stage renal disease: Secondary | ICD-10-CM | POA: Diagnosis not present

## 2013-10-12 DIAGNOSIS — N186 End stage renal disease: Secondary | ICD-10-CM | POA: Diagnosis not present

## 2013-10-12 DIAGNOSIS — N289 Disorder of kidney and ureter, unspecified: Secondary | ICD-10-CM | POA: Diagnosis not present

## 2013-10-12 DIAGNOSIS — D509 Iron deficiency anemia, unspecified: Secondary | ICD-10-CM | POA: Diagnosis not present

## 2013-10-12 DIAGNOSIS — D631 Anemia in chronic kidney disease: Secondary | ICD-10-CM | POA: Diagnosis not present

## 2013-10-12 DIAGNOSIS — Z23 Encounter for immunization: Secondary | ICD-10-CM | POA: Diagnosis not present

## 2013-10-14 DIAGNOSIS — Z23 Encounter for immunization: Secondary | ICD-10-CM | POA: Diagnosis not present

## 2013-10-14 DIAGNOSIS — D631 Anemia in chronic kidney disease: Secondary | ICD-10-CM | POA: Diagnosis not present

## 2013-10-14 DIAGNOSIS — N289 Disorder of kidney and ureter, unspecified: Secondary | ICD-10-CM | POA: Diagnosis not present

## 2013-10-14 DIAGNOSIS — Z941 Heart transplant status: Secondary | ICD-10-CM | POA: Diagnosis not present

## 2013-10-14 DIAGNOSIS — N186 End stage renal disease: Secondary | ICD-10-CM | POA: Diagnosis not present

## 2013-10-14 DIAGNOSIS — D509 Iron deficiency anemia, unspecified: Secondary | ICD-10-CM | POA: Diagnosis not present

## 2013-10-16 DIAGNOSIS — D509 Iron deficiency anemia, unspecified: Secondary | ICD-10-CM | POA: Diagnosis not present

## 2013-10-16 DIAGNOSIS — N186 End stage renal disease: Secondary | ICD-10-CM | POA: Diagnosis not present

## 2013-10-16 DIAGNOSIS — Z23 Encounter for immunization: Secondary | ICD-10-CM | POA: Diagnosis not present

## 2013-10-16 DIAGNOSIS — D631 Anemia in chronic kidney disease: Secondary | ICD-10-CM | POA: Diagnosis not present

## 2013-10-16 DIAGNOSIS — N289 Disorder of kidney and ureter, unspecified: Secondary | ICD-10-CM | POA: Diagnosis not present

## 2013-10-19 DIAGNOSIS — N186 End stage renal disease: Secondary | ICD-10-CM | POA: Diagnosis not present

## 2013-10-19 DIAGNOSIS — D509 Iron deficiency anemia, unspecified: Secondary | ICD-10-CM | POA: Diagnosis not present

## 2013-10-19 DIAGNOSIS — N289 Disorder of kidney and ureter, unspecified: Secondary | ICD-10-CM | POA: Diagnosis not present

## 2013-10-19 DIAGNOSIS — Z23 Encounter for immunization: Secondary | ICD-10-CM | POA: Diagnosis not present

## 2013-10-19 DIAGNOSIS — D631 Anemia in chronic kidney disease: Secondary | ICD-10-CM | POA: Diagnosis not present

## 2013-10-21 DIAGNOSIS — Z23 Encounter for immunization: Secondary | ICD-10-CM | POA: Diagnosis not present

## 2013-10-21 DIAGNOSIS — D631 Anemia in chronic kidney disease: Secondary | ICD-10-CM | POA: Diagnosis not present

## 2013-10-21 DIAGNOSIS — N289 Disorder of kidney and ureter, unspecified: Secondary | ICD-10-CM | POA: Diagnosis not present

## 2013-10-21 DIAGNOSIS — D509 Iron deficiency anemia, unspecified: Secondary | ICD-10-CM | POA: Diagnosis not present

## 2013-10-21 DIAGNOSIS — N186 End stage renal disease: Secondary | ICD-10-CM | POA: Diagnosis not present

## 2013-10-21 DIAGNOSIS — Z941 Heart transplant status: Secondary | ICD-10-CM | POA: Diagnosis not present

## 2013-10-23 DIAGNOSIS — N186 End stage renal disease: Secondary | ICD-10-CM | POA: Diagnosis not present

## 2013-10-23 DIAGNOSIS — D631 Anemia in chronic kidney disease: Secondary | ICD-10-CM | POA: Diagnosis not present

## 2013-10-23 DIAGNOSIS — N289 Disorder of kidney and ureter, unspecified: Secondary | ICD-10-CM | POA: Diagnosis not present

## 2013-10-23 DIAGNOSIS — Z23 Encounter for immunization: Secondary | ICD-10-CM | POA: Diagnosis not present

## 2013-10-23 DIAGNOSIS — D509 Iron deficiency anemia, unspecified: Secondary | ICD-10-CM | POA: Diagnosis not present

## 2013-10-24 DIAGNOSIS — N186 End stage renal disease: Secondary | ICD-10-CM | POA: Diagnosis not present

## 2013-10-26 DIAGNOSIS — Z23 Encounter for immunization: Secondary | ICD-10-CM | POA: Diagnosis not present

## 2013-10-26 DIAGNOSIS — D509 Iron deficiency anemia, unspecified: Secondary | ICD-10-CM | POA: Diagnosis not present

## 2013-10-26 DIAGNOSIS — N186 End stage renal disease: Secondary | ICD-10-CM | POA: Diagnosis not present

## 2013-10-26 DIAGNOSIS — D631 Anemia in chronic kidney disease: Secondary | ICD-10-CM | POA: Diagnosis not present

## 2013-10-28 DIAGNOSIS — Z941 Heart transplant status: Secondary | ICD-10-CM | POA: Diagnosis not present

## 2013-10-29 DIAGNOSIS — Z941 Heart transplant status: Secondary | ICD-10-CM | POA: Diagnosis not present

## 2013-10-29 DIAGNOSIS — J3489 Other specified disorders of nose and nasal sinuses: Secondary | ICD-10-CM | POA: Diagnosis not present

## 2013-10-29 DIAGNOSIS — I517 Cardiomegaly: Secondary | ICD-10-CM | POA: Diagnosis not present

## 2013-10-29 DIAGNOSIS — Z48298 Encounter for aftercare following other organ transplant: Secondary | ICD-10-CM | POA: Diagnosis not present

## 2013-10-29 DIAGNOSIS — I519 Heart disease, unspecified: Secondary | ICD-10-CM | POA: Diagnosis not present

## 2013-10-29 DIAGNOSIS — J329 Chronic sinusitis, unspecified: Secondary | ICD-10-CM | POA: Diagnosis not present

## 2013-10-29 DIAGNOSIS — Z79899 Other long term (current) drug therapy: Secondary | ICD-10-CM | POA: Diagnosis not present

## 2013-11-04 DIAGNOSIS — Z941 Heart transplant status: Secondary | ICD-10-CM | POA: Diagnosis not present

## 2013-11-11 DIAGNOSIS — Z941 Heart transplant status: Secondary | ICD-10-CM | POA: Diagnosis not present

## 2013-11-18 DIAGNOSIS — Z941 Heart transplant status: Secondary | ICD-10-CM | POA: Diagnosis not present

## 2013-11-24 DIAGNOSIS — N186 End stage renal disease: Secondary | ICD-10-CM | POA: Diagnosis not present

## 2013-11-25 DIAGNOSIS — N2581 Secondary hyperparathyroidism of renal origin: Secondary | ICD-10-CM | POA: Diagnosis not present

## 2013-11-25 DIAGNOSIS — D509 Iron deficiency anemia, unspecified: Secondary | ICD-10-CM | POA: Diagnosis not present

## 2013-11-25 DIAGNOSIS — Z941 Heart transplant status: Secondary | ICD-10-CM | POA: Diagnosis not present

## 2013-11-25 DIAGNOSIS — Z23 Encounter for immunization: Secondary | ICD-10-CM | POA: Diagnosis not present

## 2013-11-25 DIAGNOSIS — N186 End stage renal disease: Secondary | ICD-10-CM | POA: Diagnosis not present

## 2013-11-25 DIAGNOSIS — D631 Anemia in chronic kidney disease: Secondary | ICD-10-CM | POA: Diagnosis not present

## 2013-11-25 DIAGNOSIS — N039 Chronic nephritic syndrome with unspecified morphologic changes: Secondary | ICD-10-CM | POA: Diagnosis not present

## 2013-12-02 DIAGNOSIS — E119 Type 2 diabetes mellitus without complications: Secondary | ICD-10-CM | POA: Diagnosis not present

## 2013-12-02 DIAGNOSIS — Z941 Heart transplant status: Secondary | ICD-10-CM | POA: Diagnosis not present

## 2013-12-02 DIAGNOSIS — H40039 Anatomical narrow angle, unspecified eye: Secondary | ICD-10-CM | POA: Diagnosis not present

## 2013-12-09 DIAGNOSIS — Z941 Heart transplant status: Secondary | ICD-10-CM | POA: Diagnosis not present

## 2013-12-16 DIAGNOSIS — E1159 Type 2 diabetes mellitus with other circulatory complications: Secondary | ICD-10-CM | POA: Diagnosis not present

## 2013-12-16 DIAGNOSIS — Z941 Heart transplant status: Secondary | ICD-10-CM | POA: Diagnosis not present

## 2013-12-23 DIAGNOSIS — Z941 Heart transplant status: Secondary | ICD-10-CM | POA: Diagnosis not present

## 2013-12-24 DIAGNOSIS — N186 End stage renal disease: Secondary | ICD-10-CM | POA: Diagnosis not present

## 2013-12-25 DIAGNOSIS — E1159 Type 2 diabetes mellitus with other circulatory complications: Secondary | ICD-10-CM | POA: Diagnosis not present

## 2013-12-25 DIAGNOSIS — N186 End stage renal disease: Secondary | ICD-10-CM | POA: Diagnosis not present

## 2013-12-25 DIAGNOSIS — D509 Iron deficiency anemia, unspecified: Secondary | ICD-10-CM | POA: Diagnosis not present

## 2013-12-25 DIAGNOSIS — N2581 Secondary hyperparathyroidism of renal origin: Secondary | ICD-10-CM | POA: Diagnosis not present

## 2013-12-30 DIAGNOSIS — Z941 Heart transplant status: Secondary | ICD-10-CM | POA: Diagnosis not present

## 2013-12-31 DIAGNOSIS — E119 Type 2 diabetes mellitus without complications: Secondary | ICD-10-CM | POA: Diagnosis not present

## 2013-12-31 DIAGNOSIS — N186 End stage renal disease: Secondary | ICD-10-CM | POA: Diagnosis not present

## 2013-12-31 DIAGNOSIS — Z79899 Other long term (current) drug therapy: Secondary | ICD-10-CM | POA: Diagnosis not present

## 2013-12-31 DIAGNOSIS — D899 Disorder involving the immune mechanism, unspecified: Secondary | ICD-10-CM | POA: Diagnosis not present

## 2013-12-31 DIAGNOSIS — Z992 Dependence on renal dialysis: Secondary | ICD-10-CM | POA: Diagnosis not present

## 2013-12-31 DIAGNOSIS — Z941 Heart transplant status: Secondary | ICD-10-CM | POA: Diagnosis not present

## 2013-12-31 DIAGNOSIS — I120 Hypertensive chronic kidney disease with stage 5 chronic kidney disease or end stage renal disease: Secondary | ICD-10-CM | POA: Diagnosis not present

## 2013-12-31 DIAGNOSIS — Z48298 Encounter for aftercare following other organ transplant: Secondary | ICD-10-CM | POA: Diagnosis not present

## 2013-12-31 DIAGNOSIS — Z5181 Encounter for therapeutic drug level monitoring: Secondary | ICD-10-CM | POA: Diagnosis not present

## 2014-01-06 DIAGNOSIS — Z941 Heart transplant status: Secondary | ICD-10-CM | POA: Diagnosis not present

## 2014-01-07 DIAGNOSIS — N184 Chronic kidney disease, stage 4 (severe): Secondary | ICD-10-CM | POA: Diagnosis not present

## 2014-01-07 DIAGNOSIS — Z452 Encounter for adjustment and management of vascular access device: Secondary | ICD-10-CM | POA: Diagnosis not present

## 2014-01-12 DIAGNOSIS — Z01818 Encounter for other preprocedural examination: Secondary | ICD-10-CM | POA: Diagnosis not present

## 2014-01-12 DIAGNOSIS — N186 End stage renal disease: Secondary | ICD-10-CM | POA: Diagnosis not present

## 2014-01-12 DIAGNOSIS — D75829 Heparin-induced thrombocytopenia, unspecified: Secondary | ICD-10-CM | POA: Diagnosis not present

## 2014-01-12 DIAGNOSIS — Z7682 Awaiting organ transplant status: Secondary | ICD-10-CM | POA: Diagnosis not present

## 2014-01-12 DIAGNOSIS — D7582 Heparin induced thrombocytopenia (HIT): Secondary | ICD-10-CM | POA: Diagnosis not present

## 2014-01-13 DIAGNOSIS — Z941 Heart transplant status: Secondary | ICD-10-CM | POA: Diagnosis not present

## 2014-01-24 DIAGNOSIS — N186 End stage renal disease: Secondary | ICD-10-CM | POA: Diagnosis not present

## 2014-01-25 DIAGNOSIS — D509 Iron deficiency anemia, unspecified: Secondary | ICD-10-CM | POA: Diagnosis not present

## 2014-01-25 DIAGNOSIS — N039 Chronic nephritic syndrome with unspecified morphologic changes: Secondary | ICD-10-CM | POA: Diagnosis not present

## 2014-01-25 DIAGNOSIS — D631 Anemia in chronic kidney disease: Secondary | ICD-10-CM | POA: Diagnosis not present

## 2014-01-25 DIAGNOSIS — E1159 Type 2 diabetes mellitus with other circulatory complications: Secondary | ICD-10-CM | POA: Diagnosis not present

## 2014-01-25 DIAGNOSIS — N186 End stage renal disease: Secondary | ICD-10-CM | POA: Diagnosis not present

## 2014-01-25 DIAGNOSIS — N2581 Secondary hyperparathyroidism of renal origin: Secondary | ICD-10-CM | POA: Diagnosis not present

## 2014-02-17 ENCOUNTER — Encounter: Payer: Self-pay | Admitting: *Deleted

## 2014-02-22 ENCOUNTER — Telehealth: Payer: Self-pay | Admitting: *Deleted

## 2014-02-22 NOTE — Telephone Encounter (Signed)
Per Gwen, pt had heart transplant and no longer has a device. Transplant took place at [facility].

## 2014-02-23 DIAGNOSIS — N186 End stage renal disease: Secondary | ICD-10-CM | POA: Diagnosis not present

## 2014-02-24 DIAGNOSIS — E1159 Type 2 diabetes mellitus with other circulatory complications: Secondary | ICD-10-CM | POA: Diagnosis not present

## 2014-02-24 DIAGNOSIS — D631 Anemia in chronic kidney disease: Secondary | ICD-10-CM | POA: Diagnosis not present

## 2014-02-24 DIAGNOSIS — N186 End stage renal disease: Secondary | ICD-10-CM | POA: Diagnosis not present

## 2014-02-24 DIAGNOSIS — D509 Iron deficiency anemia, unspecified: Secondary | ICD-10-CM | POA: Diagnosis not present

## 2014-02-24 DIAGNOSIS — N2581 Secondary hyperparathyroidism of renal origin: Secondary | ICD-10-CM | POA: Diagnosis not present

## 2014-03-03 ENCOUNTER — Telehealth: Payer: Self-pay | Admitting: Cardiology

## 2014-03-03 NOTE — Telephone Encounter (Signed)
LMOVM for pt to send manual transmission due to [facility] upgrading home monitors.

## 2014-03-11 DIAGNOSIS — R972 Elevated prostate specific antigen [PSA]: Secondary | ICD-10-CM | POA: Diagnosis not present

## 2014-03-11 DIAGNOSIS — Z125 Encounter for screening for malignant neoplasm of prostate: Secondary | ICD-10-CM | POA: Diagnosis not present

## 2014-03-11 DIAGNOSIS — D631 Anemia in chronic kidney disease: Secondary | ICD-10-CM | POA: Diagnosis not present

## 2014-03-11 DIAGNOSIS — Z01810 Encounter for preprocedural cardiovascular examination: Secondary | ICD-10-CM | POA: Diagnosis not present

## 2014-03-11 DIAGNOSIS — N184 Chronic kidney disease, stage 4 (severe): Secondary | ICD-10-CM | POA: Diagnosis not present

## 2014-03-11 DIAGNOSIS — I10 Essential (primary) hypertension: Secondary | ICD-10-CM | POA: Diagnosis not present

## 2014-03-11 DIAGNOSIS — E119 Type 2 diabetes mellitus without complications: Secondary | ICD-10-CM | POA: Diagnosis not present

## 2014-03-11 DIAGNOSIS — Z941 Heart transplant status: Secondary | ICD-10-CM | POA: Diagnosis not present

## 2014-03-11 DIAGNOSIS — I517 Cardiomegaly: Secondary | ICD-10-CM | POA: Diagnosis not present

## 2014-03-11 DIAGNOSIS — Z79899 Other long term (current) drug therapy: Secondary | ICD-10-CM | POA: Diagnosis not present

## 2014-03-11 DIAGNOSIS — Z48298 Encounter for aftercare following other organ transplant: Secondary | ICD-10-CM | POA: Diagnosis not present

## 2014-03-12 ENCOUNTER — Encounter: Payer: Self-pay | Admitting: Cardiology

## 2014-03-17 DIAGNOSIS — E1159 Type 2 diabetes mellitus with other circulatory complications: Secondary | ICD-10-CM | POA: Diagnosis not present

## 2014-03-19 ENCOUNTER — Telehealth: Payer: Self-pay | Admitting: Internal Medicine

## 2014-03-19 NOTE — Telephone Encounter (Signed)
New message     Pt received a phone call from [facility] regarding his medtronic device.  Pt had a heart transplant 03-01-13 and no longer has an ICD.

## 2014-03-26 DIAGNOSIS — N186 End stage renal disease: Secondary | ICD-10-CM | POA: Diagnosis not present

## 2014-03-29 DIAGNOSIS — D509 Iron deficiency anemia, unspecified: Secondary | ICD-10-CM | POA: Diagnosis not present

## 2014-03-29 DIAGNOSIS — N2581 Secondary hyperparathyroidism of renal origin: Secondary | ICD-10-CM | POA: Diagnosis not present

## 2014-03-29 DIAGNOSIS — D631 Anemia in chronic kidney disease: Secondary | ICD-10-CM | POA: Diagnosis not present

## 2014-03-29 DIAGNOSIS — Z23 Encounter for immunization: Secondary | ICD-10-CM | POA: Diagnosis not present

## 2014-03-29 DIAGNOSIS — N186 End stage renal disease: Secondary | ICD-10-CM | POA: Diagnosis not present

## 2014-03-31 DIAGNOSIS — N039 Chronic nephritic syndrome with unspecified morphologic changes: Secondary | ICD-10-CM | POA: Diagnosis not present

## 2014-03-31 DIAGNOSIS — D631 Anemia in chronic kidney disease: Secondary | ICD-10-CM | POA: Diagnosis not present

## 2014-03-31 DIAGNOSIS — Z23 Encounter for immunization: Secondary | ICD-10-CM | POA: Diagnosis not present

## 2014-03-31 DIAGNOSIS — N2581 Secondary hyperparathyroidism of renal origin: Secondary | ICD-10-CM | POA: Diagnosis not present

## 2014-03-31 DIAGNOSIS — D509 Iron deficiency anemia, unspecified: Secondary | ICD-10-CM | POA: Diagnosis not present

## 2014-03-31 DIAGNOSIS — N186 End stage renal disease: Secondary | ICD-10-CM | POA: Diagnosis not present

## 2014-04-02 DIAGNOSIS — D631 Anemia in chronic kidney disease: Secondary | ICD-10-CM | POA: Diagnosis not present

## 2014-04-02 DIAGNOSIS — N2581 Secondary hyperparathyroidism of renal origin: Secondary | ICD-10-CM | POA: Diagnosis not present

## 2014-04-02 DIAGNOSIS — N186 End stage renal disease: Secondary | ICD-10-CM | POA: Diagnosis not present

## 2014-04-02 DIAGNOSIS — Z23 Encounter for immunization: Secondary | ICD-10-CM | POA: Diagnosis not present

## 2014-04-02 DIAGNOSIS — D509 Iron deficiency anemia, unspecified: Secondary | ICD-10-CM | POA: Diagnosis not present

## 2014-04-05 DIAGNOSIS — N2581 Secondary hyperparathyroidism of renal origin: Secondary | ICD-10-CM | POA: Diagnosis not present

## 2014-04-05 DIAGNOSIS — D509 Iron deficiency anemia, unspecified: Secondary | ICD-10-CM | POA: Diagnosis not present

## 2014-04-05 DIAGNOSIS — N039 Chronic nephritic syndrome with unspecified morphologic changes: Secondary | ICD-10-CM | POA: Diagnosis not present

## 2014-04-05 DIAGNOSIS — N186 End stage renal disease: Secondary | ICD-10-CM | POA: Diagnosis not present

## 2014-04-05 DIAGNOSIS — D631 Anemia in chronic kidney disease: Secondary | ICD-10-CM | POA: Diagnosis not present

## 2014-04-05 DIAGNOSIS — Z23 Encounter for immunization: Secondary | ICD-10-CM | POA: Diagnosis not present

## 2014-04-07 DIAGNOSIS — N2581 Secondary hyperparathyroidism of renal origin: Secondary | ICD-10-CM | POA: Diagnosis not present

## 2014-04-07 DIAGNOSIS — N186 End stage renal disease: Secondary | ICD-10-CM | POA: Diagnosis not present

## 2014-04-07 DIAGNOSIS — Z23 Encounter for immunization: Secondary | ICD-10-CM | POA: Diagnosis not present

## 2014-04-07 DIAGNOSIS — D631 Anemia in chronic kidney disease: Secondary | ICD-10-CM | POA: Diagnosis not present

## 2014-04-07 DIAGNOSIS — D509 Iron deficiency anemia, unspecified: Secondary | ICD-10-CM | POA: Diagnosis not present

## 2014-04-09 DIAGNOSIS — D509 Iron deficiency anemia, unspecified: Secondary | ICD-10-CM | POA: Diagnosis not present

## 2014-04-09 DIAGNOSIS — N2581 Secondary hyperparathyroidism of renal origin: Secondary | ICD-10-CM | POA: Diagnosis not present

## 2014-04-09 DIAGNOSIS — Z23 Encounter for immunization: Secondary | ICD-10-CM | POA: Diagnosis not present

## 2014-04-09 DIAGNOSIS — N039 Chronic nephritic syndrome with unspecified morphologic changes: Secondary | ICD-10-CM | POA: Diagnosis not present

## 2014-04-09 DIAGNOSIS — D631 Anemia in chronic kidney disease: Secondary | ICD-10-CM | POA: Diagnosis not present

## 2014-04-09 DIAGNOSIS — N186 End stage renal disease: Secondary | ICD-10-CM | POA: Diagnosis not present

## 2014-04-12 DIAGNOSIS — N186 End stage renal disease: Secondary | ICD-10-CM | POA: Diagnosis not present

## 2014-04-12 DIAGNOSIS — N2581 Secondary hyperparathyroidism of renal origin: Secondary | ICD-10-CM | POA: Diagnosis not present

## 2014-04-12 DIAGNOSIS — D631 Anemia in chronic kidney disease: Secondary | ICD-10-CM | POA: Diagnosis not present

## 2014-04-12 DIAGNOSIS — D509 Iron deficiency anemia, unspecified: Secondary | ICD-10-CM | POA: Diagnosis not present

## 2014-04-12 DIAGNOSIS — N039 Chronic nephritic syndrome with unspecified morphologic changes: Secondary | ICD-10-CM | POA: Diagnosis not present

## 2014-04-12 DIAGNOSIS — Z23 Encounter for immunization: Secondary | ICD-10-CM | POA: Diagnosis not present

## 2014-04-14 DIAGNOSIS — D631 Anemia in chronic kidney disease: Secondary | ICD-10-CM | POA: Diagnosis not present

## 2014-04-14 DIAGNOSIS — N2581 Secondary hyperparathyroidism of renal origin: Secondary | ICD-10-CM | POA: Diagnosis not present

## 2014-04-14 DIAGNOSIS — N039 Chronic nephritic syndrome with unspecified morphologic changes: Secondary | ICD-10-CM | POA: Diagnosis not present

## 2014-04-14 DIAGNOSIS — N186 End stage renal disease: Secondary | ICD-10-CM | POA: Diagnosis not present

## 2014-04-14 DIAGNOSIS — D509 Iron deficiency anemia, unspecified: Secondary | ICD-10-CM | POA: Diagnosis not present

## 2014-04-14 DIAGNOSIS — Z23 Encounter for immunization: Secondary | ICD-10-CM | POA: Diagnosis not present

## 2014-04-16 DIAGNOSIS — N2581 Secondary hyperparathyroidism of renal origin: Secondary | ICD-10-CM | POA: Diagnosis not present

## 2014-04-16 DIAGNOSIS — D631 Anemia in chronic kidney disease: Secondary | ICD-10-CM | POA: Diagnosis not present

## 2014-04-16 DIAGNOSIS — D509 Iron deficiency anemia, unspecified: Secondary | ICD-10-CM | POA: Diagnosis not present

## 2014-04-16 DIAGNOSIS — Z23 Encounter for immunization: Secondary | ICD-10-CM | POA: Diagnosis not present

## 2014-04-16 DIAGNOSIS — N186 End stage renal disease: Secondary | ICD-10-CM | POA: Diagnosis not present

## 2014-04-19 DIAGNOSIS — Z23 Encounter for immunization: Secondary | ICD-10-CM | POA: Diagnosis not present

## 2014-04-19 DIAGNOSIS — D509 Iron deficiency anemia, unspecified: Secondary | ICD-10-CM | POA: Diagnosis not present

## 2014-04-19 DIAGNOSIS — N186 End stage renal disease: Secondary | ICD-10-CM | POA: Diagnosis not present

## 2014-04-19 DIAGNOSIS — N2581 Secondary hyperparathyroidism of renal origin: Secondary | ICD-10-CM | POA: Diagnosis not present

## 2014-04-19 DIAGNOSIS — D631 Anemia in chronic kidney disease: Secondary | ICD-10-CM | POA: Diagnosis not present

## 2014-04-21 DIAGNOSIS — Z23 Encounter for immunization: Secondary | ICD-10-CM | POA: Diagnosis not present

## 2014-04-21 DIAGNOSIS — D631 Anemia in chronic kidney disease: Secondary | ICD-10-CM | POA: Diagnosis not present

## 2014-04-21 DIAGNOSIS — N2581 Secondary hyperparathyroidism of renal origin: Secondary | ICD-10-CM | POA: Diagnosis not present

## 2014-04-21 DIAGNOSIS — N039 Chronic nephritic syndrome with unspecified morphologic changes: Secondary | ICD-10-CM | POA: Diagnosis not present

## 2014-04-21 DIAGNOSIS — D509 Iron deficiency anemia, unspecified: Secondary | ICD-10-CM | POA: Diagnosis not present

## 2014-04-21 DIAGNOSIS — N186 End stage renal disease: Secondary | ICD-10-CM | POA: Diagnosis not present

## 2014-04-23 DIAGNOSIS — N2581 Secondary hyperparathyroidism of renal origin: Secondary | ICD-10-CM | POA: Diagnosis not present

## 2014-04-23 DIAGNOSIS — Z23 Encounter for immunization: Secondary | ICD-10-CM | POA: Diagnosis not present

## 2014-04-23 DIAGNOSIS — N186 End stage renal disease: Secondary | ICD-10-CM | POA: Diagnosis not present

## 2014-04-23 DIAGNOSIS — D509 Iron deficiency anemia, unspecified: Secondary | ICD-10-CM | POA: Diagnosis not present

## 2014-04-23 DIAGNOSIS — N039 Chronic nephritic syndrome with unspecified morphologic changes: Secondary | ICD-10-CM | POA: Diagnosis not present

## 2014-04-23 DIAGNOSIS — D631 Anemia in chronic kidney disease: Secondary | ICD-10-CM | POA: Diagnosis not present

## 2014-04-26 DIAGNOSIS — N2581 Secondary hyperparathyroidism of renal origin: Secondary | ICD-10-CM | POA: Diagnosis not present

## 2014-04-26 DIAGNOSIS — Z23 Encounter for immunization: Secondary | ICD-10-CM | POA: Diagnosis not present

## 2014-04-26 DIAGNOSIS — D509 Iron deficiency anemia, unspecified: Secondary | ICD-10-CM | POA: Diagnosis not present

## 2014-04-26 DIAGNOSIS — D631 Anemia in chronic kidney disease: Secondary | ICD-10-CM | POA: Diagnosis not present

## 2014-04-26 DIAGNOSIS — N186 End stage renal disease: Secondary | ICD-10-CM | POA: Diagnosis not present

## 2014-04-28 DIAGNOSIS — E1159 Type 2 diabetes mellitus with other circulatory complications: Secondary | ICD-10-CM | POA: Diagnosis not present

## 2014-04-28 DIAGNOSIS — N2581 Secondary hyperparathyroidism of renal origin: Secondary | ICD-10-CM | POA: Diagnosis not present

## 2014-04-28 DIAGNOSIS — D631 Anemia in chronic kidney disease: Secondary | ICD-10-CM | POA: Diagnosis not present

## 2014-04-28 DIAGNOSIS — D509 Iron deficiency anemia, unspecified: Secondary | ICD-10-CM | POA: Diagnosis not present

## 2014-04-28 DIAGNOSIS — N186 End stage renal disease: Secondary | ICD-10-CM | POA: Diagnosis not present

## 2014-04-28 DIAGNOSIS — N039 Chronic nephritic syndrome with unspecified morphologic changes: Secondary | ICD-10-CM | POA: Diagnosis not present

## 2014-05-26 DIAGNOSIS — N186 End stage renal disease: Secondary | ICD-10-CM | POA: Diagnosis not present

## 2014-05-28 DIAGNOSIS — D509 Iron deficiency anemia, unspecified: Secondary | ICD-10-CM | POA: Diagnosis not present

## 2014-05-28 DIAGNOSIS — N2581 Secondary hyperparathyroidism of renal origin: Secondary | ICD-10-CM | POA: Diagnosis not present

## 2014-05-28 DIAGNOSIS — Z23 Encounter for immunization: Secondary | ICD-10-CM | POA: Diagnosis not present

## 2014-05-28 DIAGNOSIS — N186 End stage renal disease: Secondary | ICD-10-CM | POA: Diagnosis not present

## 2014-05-28 DIAGNOSIS — E1151 Type 2 diabetes mellitus with diabetic peripheral angiopathy without gangrene: Secondary | ICD-10-CM | POA: Diagnosis not present

## 2014-06-10 DIAGNOSIS — I120 Hypertensive chronic kidney disease with stage 5 chronic kidney disease or end stage renal disease: Secondary | ICD-10-CM | POA: Diagnosis not present

## 2014-06-10 DIAGNOSIS — Z941 Heart transplant status: Secondary | ICD-10-CM | POA: Diagnosis not present

## 2014-06-10 DIAGNOSIS — N186 End stage renal disease: Secondary | ICD-10-CM | POA: Diagnosis not present

## 2014-06-10 DIAGNOSIS — Z992 Dependence on renal dialysis: Secondary | ICD-10-CM | POA: Diagnosis not present

## 2014-06-10 DIAGNOSIS — I071 Rheumatic tricuspid insufficiency: Secondary | ICD-10-CM | POA: Diagnosis not present

## 2014-06-10 DIAGNOSIS — T8621 Heart transplant rejection: Secondary | ICD-10-CM | POA: Diagnosis not present

## 2014-06-10 DIAGNOSIS — I517 Cardiomegaly: Secondary | ICD-10-CM | POA: Diagnosis not present

## 2014-06-10 DIAGNOSIS — Z79899 Other long term (current) drug therapy: Secondary | ICD-10-CM | POA: Diagnosis not present

## 2014-06-10 DIAGNOSIS — Z792 Long term (current) use of antibiotics: Secondary | ICD-10-CM | POA: Diagnosis not present

## 2014-06-10 DIAGNOSIS — Z7952 Long term (current) use of systemic steroids: Secondary | ICD-10-CM | POA: Diagnosis not present

## 2014-06-10 DIAGNOSIS — E119 Type 2 diabetes mellitus without complications: Secondary | ICD-10-CM | POA: Diagnosis not present

## 2014-06-10 DIAGNOSIS — Z48298 Encounter for aftercare following other organ transplant: Secondary | ICD-10-CM | POA: Diagnosis not present

## 2014-06-23 DIAGNOSIS — E1151 Type 2 diabetes mellitus with diabetic peripheral angiopathy without gangrene: Secondary | ICD-10-CM | POA: Diagnosis not present

## 2014-06-26 DIAGNOSIS — N186 End stage renal disease: Secondary | ICD-10-CM | POA: Diagnosis not present

## 2014-06-26 DIAGNOSIS — Z992 Dependence on renal dialysis: Secondary | ICD-10-CM | POA: Diagnosis not present

## 2014-06-28 DIAGNOSIS — D631 Anemia in chronic kidney disease: Secondary | ICD-10-CM | POA: Diagnosis not present

## 2014-06-28 DIAGNOSIS — N2581 Secondary hyperparathyroidism of renal origin: Secondary | ICD-10-CM | POA: Diagnosis not present

## 2014-06-28 DIAGNOSIS — N186 End stage renal disease: Secondary | ICD-10-CM | POA: Diagnosis not present

## 2014-06-28 DIAGNOSIS — E1151 Type 2 diabetes mellitus with diabetic peripheral angiopathy without gangrene: Secondary | ICD-10-CM | POA: Diagnosis not present

## 2014-06-30 DIAGNOSIS — D631 Anemia in chronic kidney disease: Secondary | ICD-10-CM | POA: Diagnosis not present

## 2014-06-30 DIAGNOSIS — N186 End stage renal disease: Secondary | ICD-10-CM | POA: Diagnosis not present

## 2014-06-30 DIAGNOSIS — E1151 Type 2 diabetes mellitus with diabetic peripheral angiopathy without gangrene: Secondary | ICD-10-CM | POA: Diagnosis not present

## 2014-06-30 DIAGNOSIS — N2581 Secondary hyperparathyroidism of renal origin: Secondary | ICD-10-CM | POA: Diagnosis not present

## 2014-07-02 DIAGNOSIS — D631 Anemia in chronic kidney disease: Secondary | ICD-10-CM | POA: Diagnosis not present

## 2014-07-02 DIAGNOSIS — E1151 Type 2 diabetes mellitus with diabetic peripheral angiopathy without gangrene: Secondary | ICD-10-CM | POA: Diagnosis not present

## 2014-07-02 DIAGNOSIS — N186 End stage renal disease: Secondary | ICD-10-CM | POA: Diagnosis not present

## 2014-07-02 DIAGNOSIS — N2581 Secondary hyperparathyroidism of renal origin: Secondary | ICD-10-CM | POA: Diagnosis not present

## 2014-07-05 DIAGNOSIS — N186 End stage renal disease: Secondary | ICD-10-CM | POA: Diagnosis not present

## 2014-07-05 DIAGNOSIS — E1151 Type 2 diabetes mellitus with diabetic peripheral angiopathy without gangrene: Secondary | ICD-10-CM | POA: Diagnosis not present

## 2014-07-05 DIAGNOSIS — N2581 Secondary hyperparathyroidism of renal origin: Secondary | ICD-10-CM | POA: Diagnosis not present

## 2014-07-05 DIAGNOSIS — D631 Anemia in chronic kidney disease: Secondary | ICD-10-CM | POA: Diagnosis not present

## 2014-07-07 DIAGNOSIS — N2581 Secondary hyperparathyroidism of renal origin: Secondary | ICD-10-CM | POA: Diagnosis not present

## 2014-07-07 DIAGNOSIS — E1151 Type 2 diabetes mellitus with diabetic peripheral angiopathy without gangrene: Secondary | ICD-10-CM | POA: Diagnosis not present

## 2014-07-07 DIAGNOSIS — N186 End stage renal disease: Secondary | ICD-10-CM | POA: Diagnosis not present

## 2014-07-07 DIAGNOSIS — D631 Anemia in chronic kidney disease: Secondary | ICD-10-CM | POA: Diagnosis not present

## 2014-07-09 DIAGNOSIS — N2581 Secondary hyperparathyroidism of renal origin: Secondary | ICD-10-CM | POA: Diagnosis not present

## 2014-07-09 DIAGNOSIS — N186 End stage renal disease: Secondary | ICD-10-CM | POA: Diagnosis not present

## 2014-07-09 DIAGNOSIS — E1151 Type 2 diabetes mellitus with diabetic peripheral angiopathy without gangrene: Secondary | ICD-10-CM | POA: Diagnosis not present

## 2014-07-09 DIAGNOSIS — D631 Anemia in chronic kidney disease: Secondary | ICD-10-CM | POA: Diagnosis not present

## 2014-07-12 DIAGNOSIS — E1151 Type 2 diabetes mellitus with diabetic peripheral angiopathy without gangrene: Secondary | ICD-10-CM | POA: Diagnosis not present

## 2014-07-12 DIAGNOSIS — D631 Anemia in chronic kidney disease: Secondary | ICD-10-CM | POA: Diagnosis not present

## 2014-07-12 DIAGNOSIS — N2581 Secondary hyperparathyroidism of renal origin: Secondary | ICD-10-CM | POA: Diagnosis not present

## 2014-07-12 DIAGNOSIS — N186 End stage renal disease: Secondary | ICD-10-CM | POA: Diagnosis not present

## 2014-07-14 DIAGNOSIS — N2581 Secondary hyperparathyroidism of renal origin: Secondary | ICD-10-CM | POA: Diagnosis not present

## 2014-07-14 DIAGNOSIS — E1151 Type 2 diabetes mellitus with diabetic peripheral angiopathy without gangrene: Secondary | ICD-10-CM | POA: Diagnosis not present

## 2014-07-14 DIAGNOSIS — N186 End stage renal disease: Secondary | ICD-10-CM | POA: Diagnosis not present

## 2014-07-14 DIAGNOSIS — D631 Anemia in chronic kidney disease: Secondary | ICD-10-CM | POA: Diagnosis not present

## 2014-07-16 DIAGNOSIS — E1151 Type 2 diabetes mellitus with diabetic peripheral angiopathy without gangrene: Secondary | ICD-10-CM | POA: Diagnosis not present

## 2014-07-16 DIAGNOSIS — N2581 Secondary hyperparathyroidism of renal origin: Secondary | ICD-10-CM | POA: Diagnosis not present

## 2014-07-16 DIAGNOSIS — N186 End stage renal disease: Secondary | ICD-10-CM | POA: Diagnosis not present

## 2014-07-16 DIAGNOSIS — D631 Anemia in chronic kidney disease: Secondary | ICD-10-CM | POA: Diagnosis not present

## 2014-07-18 DIAGNOSIS — E1151 Type 2 diabetes mellitus with diabetic peripheral angiopathy without gangrene: Secondary | ICD-10-CM | POA: Diagnosis not present

## 2014-07-18 DIAGNOSIS — N2581 Secondary hyperparathyroidism of renal origin: Secondary | ICD-10-CM | POA: Diagnosis not present

## 2014-07-18 DIAGNOSIS — N186 End stage renal disease: Secondary | ICD-10-CM | POA: Diagnosis not present

## 2014-07-18 DIAGNOSIS — D631 Anemia in chronic kidney disease: Secondary | ICD-10-CM | POA: Diagnosis not present

## 2014-07-20 DIAGNOSIS — E1151 Type 2 diabetes mellitus with diabetic peripheral angiopathy without gangrene: Secondary | ICD-10-CM | POA: Diagnosis not present

## 2014-07-20 DIAGNOSIS — N186 End stage renal disease: Secondary | ICD-10-CM | POA: Diagnosis not present

## 2014-07-20 DIAGNOSIS — D631 Anemia in chronic kidney disease: Secondary | ICD-10-CM | POA: Diagnosis not present

## 2014-07-20 DIAGNOSIS — N2581 Secondary hyperparathyroidism of renal origin: Secondary | ICD-10-CM | POA: Diagnosis not present

## 2014-07-23 DIAGNOSIS — E1151 Type 2 diabetes mellitus with diabetic peripheral angiopathy without gangrene: Secondary | ICD-10-CM | POA: Diagnosis not present

## 2014-07-23 DIAGNOSIS — N2581 Secondary hyperparathyroidism of renal origin: Secondary | ICD-10-CM | POA: Diagnosis not present

## 2014-07-23 DIAGNOSIS — N186 End stage renal disease: Secondary | ICD-10-CM | POA: Diagnosis not present

## 2014-07-23 DIAGNOSIS — D631 Anemia in chronic kidney disease: Secondary | ICD-10-CM | POA: Diagnosis not present

## 2014-07-26 DIAGNOSIS — Z992 Dependence on renal dialysis: Secondary | ICD-10-CM | POA: Diagnosis not present

## 2014-07-26 DIAGNOSIS — N186 End stage renal disease: Secondary | ICD-10-CM | POA: Diagnosis not present

## 2014-07-27 DIAGNOSIS — N186 End stage renal disease: Secondary | ICD-10-CM | POA: Diagnosis not present

## 2014-07-27 DIAGNOSIS — N2581 Secondary hyperparathyroidism of renal origin: Secondary | ICD-10-CM | POA: Diagnosis not present

## 2014-07-27 DIAGNOSIS — D509 Iron deficiency anemia, unspecified: Secondary | ICD-10-CM | POA: Diagnosis not present

## 2014-07-29 DIAGNOSIS — N2581 Secondary hyperparathyroidism of renal origin: Secondary | ICD-10-CM | POA: Diagnosis not present

## 2014-07-29 DIAGNOSIS — N186 End stage renal disease: Secondary | ICD-10-CM | POA: Diagnosis not present

## 2014-07-29 DIAGNOSIS — E1151 Type 2 diabetes mellitus with diabetic peripheral angiopathy without gangrene: Secondary | ICD-10-CM | POA: Diagnosis not present

## 2014-07-29 DIAGNOSIS — D631 Anemia in chronic kidney disease: Secondary | ICD-10-CM | POA: Diagnosis not present

## 2014-07-30 DIAGNOSIS — N186 End stage renal disease: Secondary | ICD-10-CM | POA: Diagnosis not present

## 2014-07-30 DIAGNOSIS — N2581 Secondary hyperparathyroidism of renal origin: Secondary | ICD-10-CM | POA: Diagnosis not present

## 2014-07-30 DIAGNOSIS — D631 Anemia in chronic kidney disease: Secondary | ICD-10-CM | POA: Diagnosis not present

## 2014-07-30 DIAGNOSIS — E1151 Type 2 diabetes mellitus with diabetic peripheral angiopathy without gangrene: Secondary | ICD-10-CM | POA: Diagnosis not present

## 2014-08-02 DIAGNOSIS — E1151 Type 2 diabetes mellitus with diabetic peripheral angiopathy without gangrene: Secondary | ICD-10-CM | POA: Diagnosis not present

## 2014-08-02 DIAGNOSIS — N2581 Secondary hyperparathyroidism of renal origin: Secondary | ICD-10-CM | POA: Diagnosis not present

## 2014-08-02 DIAGNOSIS — D631 Anemia in chronic kidney disease: Secondary | ICD-10-CM | POA: Diagnosis not present

## 2014-08-02 DIAGNOSIS — N186 End stage renal disease: Secondary | ICD-10-CM | POA: Diagnosis not present

## 2014-08-04 DIAGNOSIS — E1151 Type 2 diabetes mellitus with diabetic peripheral angiopathy without gangrene: Secondary | ICD-10-CM | POA: Diagnosis not present

## 2014-08-04 DIAGNOSIS — N186 End stage renal disease: Secondary | ICD-10-CM | POA: Diagnosis not present

## 2014-08-04 DIAGNOSIS — D631 Anemia in chronic kidney disease: Secondary | ICD-10-CM | POA: Diagnosis not present

## 2014-08-04 DIAGNOSIS — N2581 Secondary hyperparathyroidism of renal origin: Secondary | ICD-10-CM | POA: Diagnosis not present

## 2014-08-06 DIAGNOSIS — D631 Anemia in chronic kidney disease: Secondary | ICD-10-CM | POA: Diagnosis not present

## 2014-08-06 DIAGNOSIS — N186 End stage renal disease: Secondary | ICD-10-CM | POA: Diagnosis not present

## 2014-08-06 DIAGNOSIS — N2581 Secondary hyperparathyroidism of renal origin: Secondary | ICD-10-CM | POA: Diagnosis not present

## 2014-08-06 DIAGNOSIS — E1151 Type 2 diabetes mellitus with diabetic peripheral angiopathy without gangrene: Secondary | ICD-10-CM | POA: Diagnosis not present

## 2014-08-09 DIAGNOSIS — N2581 Secondary hyperparathyroidism of renal origin: Secondary | ICD-10-CM | POA: Diagnosis not present

## 2014-08-09 DIAGNOSIS — D631 Anemia in chronic kidney disease: Secondary | ICD-10-CM | POA: Diagnosis not present

## 2014-08-09 DIAGNOSIS — E1151 Type 2 diabetes mellitus with diabetic peripheral angiopathy without gangrene: Secondary | ICD-10-CM | POA: Diagnosis not present

## 2014-08-09 DIAGNOSIS — N186 End stage renal disease: Secondary | ICD-10-CM | POA: Diagnosis not present

## 2014-08-11 DIAGNOSIS — E1151 Type 2 diabetes mellitus with diabetic peripheral angiopathy without gangrene: Secondary | ICD-10-CM | POA: Diagnosis not present

## 2014-08-11 DIAGNOSIS — N2581 Secondary hyperparathyroidism of renal origin: Secondary | ICD-10-CM | POA: Diagnosis not present

## 2014-08-11 DIAGNOSIS — N186 End stage renal disease: Secondary | ICD-10-CM | POA: Diagnosis not present

## 2014-08-11 DIAGNOSIS — D631 Anemia in chronic kidney disease: Secondary | ICD-10-CM | POA: Diagnosis not present

## 2014-08-13 DIAGNOSIS — E1151 Type 2 diabetes mellitus with diabetic peripheral angiopathy without gangrene: Secondary | ICD-10-CM | POA: Diagnosis not present

## 2014-08-13 DIAGNOSIS — N186 End stage renal disease: Secondary | ICD-10-CM | POA: Diagnosis not present

## 2014-08-13 DIAGNOSIS — N2581 Secondary hyperparathyroidism of renal origin: Secondary | ICD-10-CM | POA: Diagnosis not present

## 2014-08-13 DIAGNOSIS — D631 Anemia in chronic kidney disease: Secondary | ICD-10-CM | POA: Diagnosis not present

## 2014-08-16 DIAGNOSIS — N2581 Secondary hyperparathyroidism of renal origin: Secondary | ICD-10-CM | POA: Diagnosis not present

## 2014-08-16 DIAGNOSIS — D631 Anemia in chronic kidney disease: Secondary | ICD-10-CM | POA: Diagnosis not present

## 2014-08-16 DIAGNOSIS — N186 End stage renal disease: Secondary | ICD-10-CM | POA: Diagnosis not present

## 2014-08-16 DIAGNOSIS — E1151 Type 2 diabetes mellitus with diabetic peripheral angiopathy without gangrene: Secondary | ICD-10-CM | POA: Diagnosis not present

## 2014-08-18 DIAGNOSIS — N2581 Secondary hyperparathyroidism of renal origin: Secondary | ICD-10-CM | POA: Diagnosis not present

## 2014-08-18 DIAGNOSIS — N186 End stage renal disease: Secondary | ICD-10-CM | POA: Diagnosis not present

## 2014-08-18 DIAGNOSIS — E1151 Type 2 diabetes mellitus with diabetic peripheral angiopathy without gangrene: Secondary | ICD-10-CM | POA: Diagnosis not present

## 2014-08-18 DIAGNOSIS — D631 Anemia in chronic kidney disease: Secondary | ICD-10-CM | POA: Diagnosis not present

## 2014-08-21 DIAGNOSIS — N186 End stage renal disease: Secondary | ICD-10-CM | POA: Diagnosis not present

## 2014-08-23 DIAGNOSIS — N2581 Secondary hyperparathyroidism of renal origin: Secondary | ICD-10-CM | POA: Diagnosis not present

## 2014-08-23 DIAGNOSIS — E1151 Type 2 diabetes mellitus with diabetic peripheral angiopathy without gangrene: Secondary | ICD-10-CM | POA: Diagnosis not present

## 2014-08-23 DIAGNOSIS — D631 Anemia in chronic kidney disease: Secondary | ICD-10-CM | POA: Diagnosis not present

## 2014-08-23 DIAGNOSIS — N186 End stage renal disease: Secondary | ICD-10-CM | POA: Diagnosis not present

## 2014-08-25 DIAGNOSIS — N186 End stage renal disease: Secondary | ICD-10-CM | POA: Diagnosis not present

## 2014-08-25 DIAGNOSIS — N2581 Secondary hyperparathyroidism of renal origin: Secondary | ICD-10-CM | POA: Diagnosis not present

## 2014-08-25 DIAGNOSIS — D631 Anemia in chronic kidney disease: Secondary | ICD-10-CM | POA: Diagnosis not present

## 2014-08-25 DIAGNOSIS — E1151 Type 2 diabetes mellitus with diabetic peripheral angiopathy without gangrene: Secondary | ICD-10-CM | POA: Diagnosis not present

## 2014-08-26 DIAGNOSIS — Z992 Dependence on renal dialysis: Secondary | ICD-10-CM | POA: Diagnosis not present

## 2014-08-26 DIAGNOSIS — N186 End stage renal disease: Secondary | ICD-10-CM | POA: Diagnosis not present

## 2014-08-28 DIAGNOSIS — N2581 Secondary hyperparathyroidism of renal origin: Secondary | ICD-10-CM | POA: Diagnosis not present

## 2014-08-28 DIAGNOSIS — D631 Anemia in chronic kidney disease: Secondary | ICD-10-CM | POA: Diagnosis not present

## 2014-08-28 DIAGNOSIS — D509 Iron deficiency anemia, unspecified: Secondary | ICD-10-CM | POA: Diagnosis not present

## 2014-08-28 DIAGNOSIS — N186 End stage renal disease: Secondary | ICD-10-CM | POA: Diagnosis not present

## 2014-08-28 DIAGNOSIS — E1151 Type 2 diabetes mellitus with diabetic peripheral angiopathy without gangrene: Secondary | ICD-10-CM | POA: Diagnosis not present

## 2014-08-30 DIAGNOSIS — N186 End stage renal disease: Secondary | ICD-10-CM | POA: Diagnosis not present

## 2014-08-30 DIAGNOSIS — E1151 Type 2 diabetes mellitus with diabetic peripheral angiopathy without gangrene: Secondary | ICD-10-CM | POA: Diagnosis not present

## 2014-08-30 DIAGNOSIS — D631 Anemia in chronic kidney disease: Secondary | ICD-10-CM | POA: Diagnosis not present

## 2014-08-30 DIAGNOSIS — N2581 Secondary hyperparathyroidism of renal origin: Secondary | ICD-10-CM | POA: Diagnosis not present

## 2014-08-30 DIAGNOSIS — D509 Iron deficiency anemia, unspecified: Secondary | ICD-10-CM | POA: Diagnosis not present

## 2014-09-01 DIAGNOSIS — N186 End stage renal disease: Secondary | ICD-10-CM | POA: Diagnosis not present

## 2014-09-01 DIAGNOSIS — E1151 Type 2 diabetes mellitus with diabetic peripheral angiopathy without gangrene: Secondary | ICD-10-CM | POA: Diagnosis not present

## 2014-09-01 DIAGNOSIS — D509 Iron deficiency anemia, unspecified: Secondary | ICD-10-CM | POA: Diagnosis not present

## 2014-09-01 DIAGNOSIS — N2581 Secondary hyperparathyroidism of renal origin: Secondary | ICD-10-CM | POA: Diagnosis not present

## 2014-09-01 DIAGNOSIS — D631 Anemia in chronic kidney disease: Secondary | ICD-10-CM | POA: Diagnosis not present

## 2014-09-03 DIAGNOSIS — D631 Anemia in chronic kidney disease: Secondary | ICD-10-CM | POA: Diagnosis not present

## 2014-09-03 DIAGNOSIS — N186 End stage renal disease: Secondary | ICD-10-CM | POA: Diagnosis not present

## 2014-09-03 DIAGNOSIS — N2581 Secondary hyperparathyroidism of renal origin: Secondary | ICD-10-CM | POA: Diagnosis not present

## 2014-09-03 DIAGNOSIS — E1151 Type 2 diabetes mellitus with diabetic peripheral angiopathy without gangrene: Secondary | ICD-10-CM | POA: Diagnosis not present

## 2014-09-03 DIAGNOSIS — D509 Iron deficiency anemia, unspecified: Secondary | ICD-10-CM | POA: Diagnosis not present

## 2014-09-06 DIAGNOSIS — N186 End stage renal disease: Secondary | ICD-10-CM | POA: Diagnosis not present

## 2014-09-06 DIAGNOSIS — N2581 Secondary hyperparathyroidism of renal origin: Secondary | ICD-10-CM | POA: Diagnosis not present

## 2014-09-06 DIAGNOSIS — E1151 Type 2 diabetes mellitus with diabetic peripheral angiopathy without gangrene: Secondary | ICD-10-CM | POA: Diagnosis not present

## 2014-09-06 DIAGNOSIS — D509 Iron deficiency anemia, unspecified: Secondary | ICD-10-CM | POA: Diagnosis not present

## 2014-09-06 DIAGNOSIS — D631 Anemia in chronic kidney disease: Secondary | ICD-10-CM | POA: Diagnosis not present

## 2014-09-08 DIAGNOSIS — D631 Anemia in chronic kidney disease: Secondary | ICD-10-CM | POA: Diagnosis not present

## 2014-09-08 DIAGNOSIS — E1151 Type 2 diabetes mellitus with diabetic peripheral angiopathy without gangrene: Secondary | ICD-10-CM | POA: Diagnosis not present

## 2014-09-08 DIAGNOSIS — D509 Iron deficiency anemia, unspecified: Secondary | ICD-10-CM | POA: Diagnosis not present

## 2014-09-08 DIAGNOSIS — N2581 Secondary hyperparathyroidism of renal origin: Secondary | ICD-10-CM | POA: Diagnosis not present

## 2014-09-08 DIAGNOSIS — N186 End stage renal disease: Secondary | ICD-10-CM | POA: Diagnosis not present

## 2014-09-10 DIAGNOSIS — N2581 Secondary hyperparathyroidism of renal origin: Secondary | ICD-10-CM | POA: Diagnosis not present

## 2014-09-10 DIAGNOSIS — D631 Anemia in chronic kidney disease: Secondary | ICD-10-CM | POA: Diagnosis not present

## 2014-09-10 DIAGNOSIS — D509 Iron deficiency anemia, unspecified: Secondary | ICD-10-CM | POA: Diagnosis not present

## 2014-09-10 DIAGNOSIS — N186 End stage renal disease: Secondary | ICD-10-CM | POA: Diagnosis not present

## 2014-09-10 DIAGNOSIS — E1151 Type 2 diabetes mellitus with diabetic peripheral angiopathy without gangrene: Secondary | ICD-10-CM | POA: Diagnosis not present

## 2014-09-13 DIAGNOSIS — D631 Anemia in chronic kidney disease: Secondary | ICD-10-CM | POA: Diagnosis not present

## 2014-09-13 DIAGNOSIS — N2581 Secondary hyperparathyroidism of renal origin: Secondary | ICD-10-CM | POA: Diagnosis not present

## 2014-09-13 DIAGNOSIS — D509 Iron deficiency anemia, unspecified: Secondary | ICD-10-CM | POA: Diagnosis not present

## 2014-09-13 DIAGNOSIS — E1151 Type 2 diabetes mellitus with diabetic peripheral angiopathy without gangrene: Secondary | ICD-10-CM | POA: Diagnosis not present

## 2014-09-13 DIAGNOSIS — N186 End stage renal disease: Secondary | ICD-10-CM | POA: Diagnosis not present

## 2014-09-15 DIAGNOSIS — E1151 Type 2 diabetes mellitus with diabetic peripheral angiopathy without gangrene: Secondary | ICD-10-CM | POA: Diagnosis not present

## 2014-09-15 DIAGNOSIS — N2581 Secondary hyperparathyroidism of renal origin: Secondary | ICD-10-CM | POA: Diagnosis not present

## 2014-09-15 DIAGNOSIS — D509 Iron deficiency anemia, unspecified: Secondary | ICD-10-CM | POA: Diagnosis not present

## 2014-09-15 DIAGNOSIS — D631 Anemia in chronic kidney disease: Secondary | ICD-10-CM | POA: Diagnosis not present

## 2014-09-15 DIAGNOSIS — N186 End stage renal disease: Secondary | ICD-10-CM | POA: Diagnosis not present

## 2014-09-16 DIAGNOSIS — I340 Nonrheumatic mitral (valve) insufficiency: Secondary | ICD-10-CM | POA: Diagnosis not present

## 2014-09-16 DIAGNOSIS — I371 Nonrheumatic pulmonary valve insufficiency: Secondary | ICD-10-CM | POA: Diagnosis not present

## 2014-09-16 DIAGNOSIS — Z79899 Other long term (current) drug therapy: Secondary | ICD-10-CM | POA: Diagnosis not present

## 2014-09-16 DIAGNOSIS — Z992 Dependence on renal dialysis: Secondary | ICD-10-CM | POA: Diagnosis not present

## 2014-09-16 DIAGNOSIS — E119 Type 2 diabetes mellitus without complications: Secondary | ICD-10-CM | POA: Diagnosis not present

## 2014-09-16 DIAGNOSIS — Z941 Heart transplant status: Secondary | ICD-10-CM | POA: Diagnosis not present

## 2014-09-16 DIAGNOSIS — I517 Cardiomegaly: Secondary | ICD-10-CM | POA: Diagnosis not present

## 2014-09-16 DIAGNOSIS — Z5181 Encounter for therapeutic drug level monitoring: Secondary | ICD-10-CM | POA: Diagnosis not present

## 2014-09-16 DIAGNOSIS — N186 End stage renal disease: Secondary | ICD-10-CM | POA: Diagnosis not present

## 2014-09-16 DIAGNOSIS — Z7982 Long term (current) use of aspirin: Secondary | ICD-10-CM | POA: Diagnosis not present

## 2014-09-16 DIAGNOSIS — I120 Hypertensive chronic kidney disease with stage 5 chronic kidney disease or end stage renal disease: Secondary | ICD-10-CM | POA: Diagnosis not present

## 2014-09-16 DIAGNOSIS — Z4821 Encounter for aftercare following heart transplant: Secondary | ICD-10-CM | POA: Diagnosis not present

## 2014-09-16 DIAGNOSIS — Z48298 Encounter for aftercare following other organ transplant: Secondary | ICD-10-CM | POA: Diagnosis not present

## 2014-09-17 DIAGNOSIS — N186 End stage renal disease: Secondary | ICD-10-CM | POA: Diagnosis not present

## 2014-09-17 DIAGNOSIS — N2581 Secondary hyperparathyroidism of renal origin: Secondary | ICD-10-CM | POA: Diagnosis not present

## 2014-09-17 DIAGNOSIS — E1151 Type 2 diabetes mellitus with diabetic peripheral angiopathy without gangrene: Secondary | ICD-10-CM | POA: Diagnosis not present

## 2014-09-17 DIAGNOSIS — D631 Anemia in chronic kidney disease: Secondary | ICD-10-CM | POA: Diagnosis not present

## 2014-09-17 DIAGNOSIS — D509 Iron deficiency anemia, unspecified: Secondary | ICD-10-CM | POA: Diagnosis not present

## 2014-09-20 DIAGNOSIS — N2581 Secondary hyperparathyroidism of renal origin: Secondary | ICD-10-CM | POA: Diagnosis not present

## 2014-09-20 DIAGNOSIS — N186 End stage renal disease: Secondary | ICD-10-CM | POA: Diagnosis not present

## 2014-09-20 DIAGNOSIS — E1151 Type 2 diabetes mellitus with diabetic peripheral angiopathy without gangrene: Secondary | ICD-10-CM | POA: Diagnosis not present

## 2014-09-20 DIAGNOSIS — D631 Anemia in chronic kidney disease: Secondary | ICD-10-CM | POA: Diagnosis not present

## 2014-09-20 DIAGNOSIS — D509 Iron deficiency anemia, unspecified: Secondary | ICD-10-CM | POA: Diagnosis not present

## 2014-09-22 DIAGNOSIS — N186 End stage renal disease: Secondary | ICD-10-CM | POA: Diagnosis not present

## 2014-09-22 DIAGNOSIS — D631 Anemia in chronic kidney disease: Secondary | ICD-10-CM | POA: Diagnosis not present

## 2014-09-22 DIAGNOSIS — D509 Iron deficiency anemia, unspecified: Secondary | ICD-10-CM | POA: Diagnosis not present

## 2014-09-22 DIAGNOSIS — N2581 Secondary hyperparathyroidism of renal origin: Secondary | ICD-10-CM | POA: Diagnosis not present

## 2014-09-22 DIAGNOSIS — E1151 Type 2 diabetes mellitus with diabetic peripheral angiopathy without gangrene: Secondary | ICD-10-CM | POA: Diagnosis not present

## 2014-09-24 DIAGNOSIS — D509 Iron deficiency anemia, unspecified: Secondary | ICD-10-CM | POA: Diagnosis not present

## 2014-09-24 DIAGNOSIS — E1151 Type 2 diabetes mellitus with diabetic peripheral angiopathy without gangrene: Secondary | ICD-10-CM | POA: Diagnosis not present

## 2014-09-24 DIAGNOSIS — N2581 Secondary hyperparathyroidism of renal origin: Secondary | ICD-10-CM | POA: Diagnosis not present

## 2014-09-24 DIAGNOSIS — N186 End stage renal disease: Secondary | ICD-10-CM | POA: Diagnosis not present

## 2014-09-24 DIAGNOSIS — D631 Anemia in chronic kidney disease: Secondary | ICD-10-CM | POA: Diagnosis not present

## 2014-09-26 DIAGNOSIS — N186 End stage renal disease: Secondary | ICD-10-CM | POA: Diagnosis not present

## 2014-09-26 DIAGNOSIS — Z992 Dependence on renal dialysis: Secondary | ICD-10-CM | POA: Diagnosis not present

## 2014-09-27 DIAGNOSIS — D631 Anemia in chronic kidney disease: Secondary | ICD-10-CM | POA: Diagnosis not present

## 2014-09-27 DIAGNOSIS — D509 Iron deficiency anemia, unspecified: Secondary | ICD-10-CM | POA: Diagnosis not present

## 2014-09-27 DIAGNOSIS — E1151 Type 2 diabetes mellitus with diabetic peripheral angiopathy without gangrene: Secondary | ICD-10-CM | POA: Diagnosis not present

## 2014-09-27 DIAGNOSIS — N2581 Secondary hyperparathyroidism of renal origin: Secondary | ICD-10-CM | POA: Diagnosis not present

## 2014-09-27 DIAGNOSIS — N186 End stage renal disease: Secondary | ICD-10-CM | POA: Diagnosis not present

## 2014-09-29 DIAGNOSIS — D509 Iron deficiency anemia, unspecified: Secondary | ICD-10-CM | POA: Diagnosis not present

## 2014-09-29 DIAGNOSIS — E1151 Type 2 diabetes mellitus with diabetic peripheral angiopathy without gangrene: Secondary | ICD-10-CM | POA: Diagnosis not present

## 2014-09-29 DIAGNOSIS — D631 Anemia in chronic kidney disease: Secondary | ICD-10-CM | POA: Diagnosis not present

## 2014-09-29 DIAGNOSIS — N186 End stage renal disease: Secondary | ICD-10-CM | POA: Diagnosis not present

## 2014-09-29 DIAGNOSIS — N2581 Secondary hyperparathyroidism of renal origin: Secondary | ICD-10-CM | POA: Diagnosis not present

## 2014-10-01 DIAGNOSIS — N186 End stage renal disease: Secondary | ICD-10-CM | POA: Diagnosis not present

## 2014-10-01 DIAGNOSIS — D631 Anemia in chronic kidney disease: Secondary | ICD-10-CM | POA: Diagnosis not present

## 2014-10-01 DIAGNOSIS — E1151 Type 2 diabetes mellitus with diabetic peripheral angiopathy without gangrene: Secondary | ICD-10-CM | POA: Diagnosis not present

## 2014-10-01 DIAGNOSIS — N2581 Secondary hyperparathyroidism of renal origin: Secondary | ICD-10-CM | POA: Diagnosis not present

## 2014-10-01 DIAGNOSIS — D509 Iron deficiency anemia, unspecified: Secondary | ICD-10-CM | POA: Diagnosis not present

## 2014-10-04 DIAGNOSIS — N186 End stage renal disease: Secondary | ICD-10-CM | POA: Diagnosis not present

## 2014-10-04 DIAGNOSIS — D509 Iron deficiency anemia, unspecified: Secondary | ICD-10-CM | POA: Diagnosis not present

## 2014-10-04 DIAGNOSIS — E1151 Type 2 diabetes mellitus with diabetic peripheral angiopathy without gangrene: Secondary | ICD-10-CM | POA: Diagnosis not present

## 2014-10-04 DIAGNOSIS — D631 Anemia in chronic kidney disease: Secondary | ICD-10-CM | POA: Diagnosis not present

## 2014-10-04 DIAGNOSIS — N2581 Secondary hyperparathyroidism of renal origin: Secondary | ICD-10-CM | POA: Diagnosis not present

## 2014-10-06 DIAGNOSIS — D509 Iron deficiency anemia, unspecified: Secondary | ICD-10-CM | POA: Diagnosis not present

## 2014-10-06 DIAGNOSIS — N186 End stage renal disease: Secondary | ICD-10-CM | POA: Diagnosis not present

## 2014-10-06 DIAGNOSIS — E1151 Type 2 diabetes mellitus with diabetic peripheral angiopathy without gangrene: Secondary | ICD-10-CM | POA: Diagnosis not present

## 2014-10-06 DIAGNOSIS — N2581 Secondary hyperparathyroidism of renal origin: Secondary | ICD-10-CM | POA: Diagnosis not present

## 2014-10-06 DIAGNOSIS — D631 Anemia in chronic kidney disease: Secondary | ICD-10-CM | POA: Diagnosis not present

## 2014-10-08 DIAGNOSIS — N2581 Secondary hyperparathyroidism of renal origin: Secondary | ICD-10-CM | POA: Diagnosis not present

## 2014-10-08 DIAGNOSIS — N186 End stage renal disease: Secondary | ICD-10-CM | POA: Diagnosis not present

## 2014-10-08 DIAGNOSIS — D509 Iron deficiency anemia, unspecified: Secondary | ICD-10-CM | POA: Diagnosis not present

## 2014-10-08 DIAGNOSIS — D631 Anemia in chronic kidney disease: Secondary | ICD-10-CM | POA: Diagnosis not present

## 2014-10-08 DIAGNOSIS — E1151 Type 2 diabetes mellitus with diabetic peripheral angiopathy without gangrene: Secondary | ICD-10-CM | POA: Diagnosis not present

## 2014-10-11 DIAGNOSIS — N186 End stage renal disease: Secondary | ICD-10-CM | POA: Diagnosis not present

## 2014-10-11 DIAGNOSIS — N2581 Secondary hyperparathyroidism of renal origin: Secondary | ICD-10-CM | POA: Diagnosis not present

## 2014-10-11 DIAGNOSIS — E1151 Type 2 diabetes mellitus with diabetic peripheral angiopathy without gangrene: Secondary | ICD-10-CM | POA: Diagnosis not present

## 2014-10-11 DIAGNOSIS — D509 Iron deficiency anemia, unspecified: Secondary | ICD-10-CM | POA: Diagnosis not present

## 2014-10-11 DIAGNOSIS — D631 Anemia in chronic kidney disease: Secondary | ICD-10-CM | POA: Diagnosis not present

## 2014-10-12 DIAGNOSIS — D509 Iron deficiency anemia, unspecified: Secondary | ICD-10-CM | POA: Diagnosis not present

## 2014-10-12 DIAGNOSIS — N2581 Secondary hyperparathyroidism of renal origin: Secondary | ICD-10-CM | POA: Diagnosis not present

## 2014-10-12 DIAGNOSIS — D631 Anemia in chronic kidney disease: Secondary | ICD-10-CM | POA: Diagnosis not present

## 2014-10-12 DIAGNOSIS — E1151 Type 2 diabetes mellitus with diabetic peripheral angiopathy without gangrene: Secondary | ICD-10-CM | POA: Diagnosis not present

## 2014-10-12 DIAGNOSIS — N186 End stage renal disease: Secondary | ICD-10-CM | POA: Diagnosis not present

## 2014-10-13 DIAGNOSIS — N2581 Secondary hyperparathyroidism of renal origin: Secondary | ICD-10-CM | POA: Diagnosis not present

## 2014-10-13 DIAGNOSIS — E1151 Type 2 diabetes mellitus with diabetic peripheral angiopathy without gangrene: Secondary | ICD-10-CM | POA: Diagnosis not present

## 2014-10-13 DIAGNOSIS — D631 Anemia in chronic kidney disease: Secondary | ICD-10-CM | POA: Diagnosis not present

## 2014-10-13 DIAGNOSIS — D509 Iron deficiency anemia, unspecified: Secondary | ICD-10-CM | POA: Diagnosis not present

## 2014-10-13 DIAGNOSIS — N186 End stage renal disease: Secondary | ICD-10-CM | POA: Diagnosis not present

## 2014-10-14 DIAGNOSIS — N5201 Erectile dysfunction due to arterial insufficiency: Secondary | ICD-10-CM | POA: Diagnosis not present

## 2014-10-15 DIAGNOSIS — D509 Iron deficiency anemia, unspecified: Secondary | ICD-10-CM | POA: Diagnosis not present

## 2014-10-15 DIAGNOSIS — E1151 Type 2 diabetes mellitus with diabetic peripheral angiopathy without gangrene: Secondary | ICD-10-CM | POA: Diagnosis not present

## 2014-10-15 DIAGNOSIS — N186 End stage renal disease: Secondary | ICD-10-CM | POA: Diagnosis not present

## 2014-10-15 DIAGNOSIS — D631 Anemia in chronic kidney disease: Secondary | ICD-10-CM | POA: Diagnosis not present

## 2014-10-15 DIAGNOSIS — N2581 Secondary hyperparathyroidism of renal origin: Secondary | ICD-10-CM | POA: Diagnosis not present

## 2014-10-18 DIAGNOSIS — N2581 Secondary hyperparathyroidism of renal origin: Secondary | ICD-10-CM | POA: Diagnosis not present

## 2014-10-18 DIAGNOSIS — N186 End stage renal disease: Secondary | ICD-10-CM | POA: Diagnosis not present

## 2014-10-18 DIAGNOSIS — E1151 Type 2 diabetes mellitus with diabetic peripheral angiopathy without gangrene: Secondary | ICD-10-CM | POA: Diagnosis not present

## 2014-10-18 DIAGNOSIS — D631 Anemia in chronic kidney disease: Secondary | ICD-10-CM | POA: Diagnosis not present

## 2014-10-18 DIAGNOSIS — D509 Iron deficiency anemia, unspecified: Secondary | ICD-10-CM | POA: Diagnosis not present

## 2014-10-20 DIAGNOSIS — E1151 Type 2 diabetes mellitus with diabetic peripheral angiopathy without gangrene: Secondary | ICD-10-CM | POA: Diagnosis not present

## 2014-10-20 DIAGNOSIS — D631 Anemia in chronic kidney disease: Secondary | ICD-10-CM | POA: Diagnosis not present

## 2014-10-20 DIAGNOSIS — N2581 Secondary hyperparathyroidism of renal origin: Secondary | ICD-10-CM | POA: Diagnosis not present

## 2014-10-20 DIAGNOSIS — D509 Iron deficiency anemia, unspecified: Secondary | ICD-10-CM | POA: Diagnosis not present

## 2014-10-20 DIAGNOSIS — N186 End stage renal disease: Secondary | ICD-10-CM | POA: Diagnosis not present

## 2014-10-22 DIAGNOSIS — N2581 Secondary hyperparathyroidism of renal origin: Secondary | ICD-10-CM | POA: Diagnosis not present

## 2014-10-22 DIAGNOSIS — N186 End stage renal disease: Secondary | ICD-10-CM | POA: Diagnosis not present

## 2014-10-22 DIAGNOSIS — D509 Iron deficiency anemia, unspecified: Secondary | ICD-10-CM | POA: Diagnosis not present

## 2014-10-22 DIAGNOSIS — E1151 Type 2 diabetes mellitus with diabetic peripheral angiopathy without gangrene: Secondary | ICD-10-CM | POA: Diagnosis not present

## 2014-10-22 DIAGNOSIS — D631 Anemia in chronic kidney disease: Secondary | ICD-10-CM | POA: Diagnosis not present

## 2014-10-25 DIAGNOSIS — D509 Iron deficiency anemia, unspecified: Secondary | ICD-10-CM | POA: Diagnosis not present

## 2014-10-25 DIAGNOSIS — D631 Anemia in chronic kidney disease: Secondary | ICD-10-CM | POA: Diagnosis not present

## 2014-10-25 DIAGNOSIS — N186 End stage renal disease: Secondary | ICD-10-CM | POA: Diagnosis not present

## 2014-10-25 DIAGNOSIS — Z992 Dependence on renal dialysis: Secondary | ICD-10-CM | POA: Diagnosis not present

## 2014-10-25 DIAGNOSIS — E1151 Type 2 diabetes mellitus with diabetic peripheral angiopathy without gangrene: Secondary | ICD-10-CM | POA: Diagnosis not present

## 2014-10-25 DIAGNOSIS — N2581 Secondary hyperparathyroidism of renal origin: Secondary | ICD-10-CM | POA: Diagnosis not present

## 2014-10-27 DIAGNOSIS — N186 End stage renal disease: Secondary | ICD-10-CM | POA: Diagnosis not present

## 2014-10-27 DIAGNOSIS — N2581 Secondary hyperparathyroidism of renal origin: Secondary | ICD-10-CM | POA: Diagnosis not present

## 2014-10-27 DIAGNOSIS — D509 Iron deficiency anemia, unspecified: Secondary | ICD-10-CM | POA: Diagnosis not present

## 2014-10-27 DIAGNOSIS — D631 Anemia in chronic kidney disease: Secondary | ICD-10-CM | POA: Diagnosis not present

## 2014-10-29 DIAGNOSIS — N186 End stage renal disease: Secondary | ICD-10-CM | POA: Diagnosis not present

## 2014-10-29 DIAGNOSIS — D631 Anemia in chronic kidney disease: Secondary | ICD-10-CM | POA: Diagnosis not present

## 2014-10-29 DIAGNOSIS — N2581 Secondary hyperparathyroidism of renal origin: Secondary | ICD-10-CM | POA: Diagnosis not present

## 2014-10-29 DIAGNOSIS — D509 Iron deficiency anemia, unspecified: Secondary | ICD-10-CM | POA: Diagnosis not present

## 2014-11-01 DIAGNOSIS — D509 Iron deficiency anemia, unspecified: Secondary | ICD-10-CM | POA: Diagnosis not present

## 2014-11-01 DIAGNOSIS — D631 Anemia in chronic kidney disease: Secondary | ICD-10-CM | POA: Diagnosis not present

## 2014-11-01 DIAGNOSIS — N2581 Secondary hyperparathyroidism of renal origin: Secondary | ICD-10-CM | POA: Diagnosis not present

## 2014-11-01 DIAGNOSIS — N186 End stage renal disease: Secondary | ICD-10-CM | POA: Diagnosis not present

## 2014-11-03 DIAGNOSIS — D631 Anemia in chronic kidney disease: Secondary | ICD-10-CM | POA: Diagnosis not present

## 2014-11-03 DIAGNOSIS — N2581 Secondary hyperparathyroidism of renal origin: Secondary | ICD-10-CM | POA: Diagnosis not present

## 2014-11-03 DIAGNOSIS — D509 Iron deficiency anemia, unspecified: Secondary | ICD-10-CM | POA: Diagnosis not present

## 2014-11-03 DIAGNOSIS — N186 End stage renal disease: Secondary | ICD-10-CM | POA: Diagnosis not present

## 2014-11-05 DIAGNOSIS — D509 Iron deficiency anemia, unspecified: Secondary | ICD-10-CM | POA: Diagnosis not present

## 2014-11-05 DIAGNOSIS — N186 End stage renal disease: Secondary | ICD-10-CM | POA: Diagnosis not present

## 2014-11-05 DIAGNOSIS — D631 Anemia in chronic kidney disease: Secondary | ICD-10-CM | POA: Diagnosis not present

## 2014-11-05 DIAGNOSIS — N2581 Secondary hyperparathyroidism of renal origin: Secondary | ICD-10-CM | POA: Diagnosis not present

## 2014-11-08 DIAGNOSIS — D631 Anemia in chronic kidney disease: Secondary | ICD-10-CM | POA: Diagnosis not present

## 2014-11-08 DIAGNOSIS — N2581 Secondary hyperparathyroidism of renal origin: Secondary | ICD-10-CM | POA: Diagnosis not present

## 2014-11-08 DIAGNOSIS — N186 End stage renal disease: Secondary | ICD-10-CM | POA: Diagnosis not present

## 2014-11-08 DIAGNOSIS — D509 Iron deficiency anemia, unspecified: Secondary | ICD-10-CM | POA: Diagnosis not present

## 2014-11-10 DIAGNOSIS — D631 Anemia in chronic kidney disease: Secondary | ICD-10-CM | POA: Diagnosis not present

## 2014-11-10 DIAGNOSIS — N186 End stage renal disease: Secondary | ICD-10-CM | POA: Diagnosis not present

## 2014-11-10 DIAGNOSIS — N2581 Secondary hyperparathyroidism of renal origin: Secondary | ICD-10-CM | POA: Diagnosis not present

## 2014-11-10 DIAGNOSIS — D509 Iron deficiency anemia, unspecified: Secondary | ICD-10-CM | POA: Diagnosis not present

## 2014-11-12 DIAGNOSIS — N186 End stage renal disease: Secondary | ICD-10-CM | POA: Diagnosis not present

## 2014-11-12 DIAGNOSIS — N2581 Secondary hyperparathyroidism of renal origin: Secondary | ICD-10-CM | POA: Diagnosis not present

## 2014-11-12 DIAGNOSIS — D509 Iron deficiency anemia, unspecified: Secondary | ICD-10-CM | POA: Diagnosis not present

## 2014-11-12 DIAGNOSIS — D631 Anemia in chronic kidney disease: Secondary | ICD-10-CM | POA: Diagnosis not present

## 2014-11-15 DIAGNOSIS — N2581 Secondary hyperparathyroidism of renal origin: Secondary | ICD-10-CM | POA: Diagnosis not present

## 2014-11-15 DIAGNOSIS — N186 End stage renal disease: Secondary | ICD-10-CM | POA: Diagnosis not present

## 2014-11-15 DIAGNOSIS — D631 Anemia in chronic kidney disease: Secondary | ICD-10-CM | POA: Diagnosis not present

## 2014-11-15 DIAGNOSIS — D509 Iron deficiency anemia, unspecified: Secondary | ICD-10-CM | POA: Diagnosis not present

## 2014-11-17 DIAGNOSIS — D509 Iron deficiency anemia, unspecified: Secondary | ICD-10-CM | POA: Diagnosis not present

## 2014-11-17 DIAGNOSIS — N186 End stage renal disease: Secondary | ICD-10-CM | POA: Diagnosis not present

## 2014-11-17 DIAGNOSIS — N2581 Secondary hyperparathyroidism of renal origin: Secondary | ICD-10-CM | POA: Diagnosis not present

## 2014-11-17 DIAGNOSIS — D631 Anemia in chronic kidney disease: Secondary | ICD-10-CM | POA: Diagnosis not present

## 2014-11-18 DIAGNOSIS — N5201 Erectile dysfunction due to arterial insufficiency: Secondary | ICD-10-CM | POA: Diagnosis not present

## 2014-11-19 DIAGNOSIS — N2581 Secondary hyperparathyroidism of renal origin: Secondary | ICD-10-CM | POA: Diagnosis not present

## 2014-11-19 DIAGNOSIS — D631 Anemia in chronic kidney disease: Secondary | ICD-10-CM | POA: Diagnosis not present

## 2014-11-19 DIAGNOSIS — D509 Iron deficiency anemia, unspecified: Secondary | ICD-10-CM | POA: Diagnosis not present

## 2014-11-19 DIAGNOSIS — N186 End stage renal disease: Secondary | ICD-10-CM | POA: Diagnosis not present

## 2014-11-22 DIAGNOSIS — D631 Anemia in chronic kidney disease: Secondary | ICD-10-CM | POA: Diagnosis not present

## 2014-11-22 DIAGNOSIS — D509 Iron deficiency anemia, unspecified: Secondary | ICD-10-CM | POA: Diagnosis not present

## 2014-11-22 DIAGNOSIS — N2581 Secondary hyperparathyroidism of renal origin: Secondary | ICD-10-CM | POA: Diagnosis not present

## 2014-11-22 DIAGNOSIS — N186 End stage renal disease: Secondary | ICD-10-CM | POA: Diagnosis not present

## 2014-11-24 DIAGNOSIS — N2581 Secondary hyperparathyroidism of renal origin: Secondary | ICD-10-CM | POA: Diagnosis not present

## 2014-11-24 DIAGNOSIS — D509 Iron deficiency anemia, unspecified: Secondary | ICD-10-CM | POA: Diagnosis not present

## 2014-11-24 DIAGNOSIS — D631 Anemia in chronic kidney disease: Secondary | ICD-10-CM | POA: Diagnosis not present

## 2014-11-24 DIAGNOSIS — N186 End stage renal disease: Secondary | ICD-10-CM | POA: Diagnosis not present

## 2014-11-25 DIAGNOSIS — T8620 Unspecified complication of heart transplant: Secondary | ICD-10-CM | POA: Diagnosis not present

## 2014-11-25 DIAGNOSIS — N186 End stage renal disease: Secondary | ICD-10-CM | POA: Diagnosis not present

## 2014-11-25 DIAGNOSIS — Z992 Dependence on renal dialysis: Secondary | ICD-10-CM | POA: Diagnosis not present

## 2014-11-26 DIAGNOSIS — D509 Iron deficiency anemia, unspecified: Secondary | ICD-10-CM | POA: Diagnosis not present

## 2014-11-26 DIAGNOSIS — N186 End stage renal disease: Secondary | ICD-10-CM | POA: Diagnosis not present

## 2014-11-26 DIAGNOSIS — Z23 Encounter for immunization: Secondary | ICD-10-CM | POA: Diagnosis not present

## 2014-11-26 DIAGNOSIS — N2581 Secondary hyperparathyroidism of renal origin: Secondary | ICD-10-CM | POA: Diagnosis not present

## 2014-11-26 DIAGNOSIS — D631 Anemia in chronic kidney disease: Secondary | ICD-10-CM | POA: Diagnosis not present

## 2014-11-29 DIAGNOSIS — D631 Anemia in chronic kidney disease: Secondary | ICD-10-CM | POA: Diagnosis not present

## 2014-11-29 DIAGNOSIS — D509 Iron deficiency anemia, unspecified: Secondary | ICD-10-CM | POA: Diagnosis not present

## 2014-11-29 DIAGNOSIS — N2581 Secondary hyperparathyroidism of renal origin: Secondary | ICD-10-CM | POA: Diagnosis not present

## 2014-11-29 DIAGNOSIS — N186 End stage renal disease: Secondary | ICD-10-CM | POA: Diagnosis not present

## 2014-11-29 DIAGNOSIS — Z23 Encounter for immunization: Secondary | ICD-10-CM | POA: Diagnosis not present

## 2014-12-01 DIAGNOSIS — N2581 Secondary hyperparathyroidism of renal origin: Secondary | ICD-10-CM | POA: Diagnosis not present

## 2014-12-01 DIAGNOSIS — D509 Iron deficiency anemia, unspecified: Secondary | ICD-10-CM | POA: Diagnosis not present

## 2014-12-01 DIAGNOSIS — Z23 Encounter for immunization: Secondary | ICD-10-CM | POA: Diagnosis not present

## 2014-12-01 DIAGNOSIS — N186 End stage renal disease: Secondary | ICD-10-CM | POA: Diagnosis not present

## 2014-12-01 DIAGNOSIS — D631 Anemia in chronic kidney disease: Secondary | ICD-10-CM | POA: Diagnosis not present

## 2014-12-03 DIAGNOSIS — N2581 Secondary hyperparathyroidism of renal origin: Secondary | ICD-10-CM | POA: Diagnosis not present

## 2014-12-03 DIAGNOSIS — D509 Iron deficiency anemia, unspecified: Secondary | ICD-10-CM | POA: Diagnosis not present

## 2014-12-03 DIAGNOSIS — Z23 Encounter for immunization: Secondary | ICD-10-CM | POA: Diagnosis not present

## 2014-12-03 DIAGNOSIS — D631 Anemia in chronic kidney disease: Secondary | ICD-10-CM | POA: Diagnosis not present

## 2014-12-03 DIAGNOSIS — N186 End stage renal disease: Secondary | ICD-10-CM | POA: Diagnosis not present

## 2014-12-06 DIAGNOSIS — D631 Anemia in chronic kidney disease: Secondary | ICD-10-CM | POA: Diagnosis not present

## 2014-12-06 DIAGNOSIS — Z23 Encounter for immunization: Secondary | ICD-10-CM | POA: Diagnosis not present

## 2014-12-06 DIAGNOSIS — N186 End stage renal disease: Secondary | ICD-10-CM | POA: Diagnosis not present

## 2014-12-06 DIAGNOSIS — D509 Iron deficiency anemia, unspecified: Secondary | ICD-10-CM | POA: Diagnosis not present

## 2014-12-06 DIAGNOSIS — N2581 Secondary hyperparathyroidism of renal origin: Secondary | ICD-10-CM | POA: Diagnosis not present

## 2014-12-08 DIAGNOSIS — N2581 Secondary hyperparathyroidism of renal origin: Secondary | ICD-10-CM | POA: Diagnosis not present

## 2014-12-08 DIAGNOSIS — N186 End stage renal disease: Secondary | ICD-10-CM | POA: Diagnosis not present

## 2014-12-08 DIAGNOSIS — D631 Anemia in chronic kidney disease: Secondary | ICD-10-CM | POA: Diagnosis not present

## 2014-12-08 DIAGNOSIS — Z23 Encounter for immunization: Secondary | ICD-10-CM | POA: Diagnosis not present

## 2014-12-08 DIAGNOSIS — D509 Iron deficiency anemia, unspecified: Secondary | ICD-10-CM | POA: Diagnosis not present

## 2014-12-10 DIAGNOSIS — N186 End stage renal disease: Secondary | ICD-10-CM | POA: Diagnosis not present

## 2014-12-10 DIAGNOSIS — Z23 Encounter for immunization: Secondary | ICD-10-CM | POA: Diagnosis not present

## 2014-12-10 DIAGNOSIS — D631 Anemia in chronic kidney disease: Secondary | ICD-10-CM | POA: Diagnosis not present

## 2014-12-10 DIAGNOSIS — D509 Iron deficiency anemia, unspecified: Secondary | ICD-10-CM | POA: Diagnosis not present

## 2014-12-10 DIAGNOSIS — N2581 Secondary hyperparathyroidism of renal origin: Secondary | ICD-10-CM | POA: Diagnosis not present

## 2014-12-13 DIAGNOSIS — D631 Anemia in chronic kidney disease: Secondary | ICD-10-CM | POA: Diagnosis not present

## 2014-12-13 DIAGNOSIS — Z23 Encounter for immunization: Secondary | ICD-10-CM | POA: Diagnosis not present

## 2014-12-13 DIAGNOSIS — N2581 Secondary hyperparathyroidism of renal origin: Secondary | ICD-10-CM | POA: Diagnosis not present

## 2014-12-13 DIAGNOSIS — D509 Iron deficiency anemia, unspecified: Secondary | ICD-10-CM | POA: Diagnosis not present

## 2014-12-13 DIAGNOSIS — N186 End stage renal disease: Secondary | ICD-10-CM | POA: Diagnosis not present

## 2014-12-15 DIAGNOSIS — Z23 Encounter for immunization: Secondary | ICD-10-CM | POA: Diagnosis not present

## 2014-12-15 DIAGNOSIS — N186 End stage renal disease: Secondary | ICD-10-CM | POA: Diagnosis not present

## 2014-12-15 DIAGNOSIS — D631 Anemia in chronic kidney disease: Secondary | ICD-10-CM | POA: Diagnosis not present

## 2014-12-15 DIAGNOSIS — N2581 Secondary hyperparathyroidism of renal origin: Secondary | ICD-10-CM | POA: Diagnosis not present

## 2014-12-15 DIAGNOSIS — D509 Iron deficiency anemia, unspecified: Secondary | ICD-10-CM | POA: Diagnosis not present

## 2014-12-16 DIAGNOSIS — Z792 Long term (current) use of antibiotics: Secondary | ICD-10-CM | POA: Diagnosis not present

## 2014-12-16 DIAGNOSIS — Z992 Dependence on renal dialysis: Secondary | ICD-10-CM | POA: Diagnosis not present

## 2014-12-16 DIAGNOSIS — Z48298 Encounter for aftercare following other organ transplant: Secondary | ICD-10-CM | POA: Diagnosis not present

## 2014-12-16 DIAGNOSIS — K121 Other forms of stomatitis: Secondary | ICD-10-CM | POA: Diagnosis not present

## 2014-12-16 DIAGNOSIS — E119 Type 2 diabetes mellitus without complications: Secondary | ICD-10-CM | POA: Diagnosis not present

## 2014-12-16 DIAGNOSIS — I371 Nonrheumatic pulmonary valve insufficiency: Secondary | ICD-10-CM | POA: Diagnosis not present

## 2014-12-16 DIAGNOSIS — Z941 Heart transplant status: Secondary | ICD-10-CM | POA: Diagnosis not present

## 2014-12-16 DIAGNOSIS — Z4821 Encounter for aftercare following heart transplant: Secondary | ICD-10-CM | POA: Diagnosis not present

## 2014-12-16 DIAGNOSIS — N186 End stage renal disease: Secondary | ICD-10-CM | POA: Diagnosis not present

## 2014-12-16 DIAGNOSIS — I517 Cardiomegaly: Secondary | ICD-10-CM | POA: Diagnosis not present

## 2014-12-16 DIAGNOSIS — Z79899 Other long term (current) drug therapy: Secondary | ICD-10-CM | POA: Diagnosis not present

## 2014-12-16 DIAGNOSIS — I120 Hypertensive chronic kidney disease with stage 5 chronic kidney disease or end stage renal disease: Secondary | ICD-10-CM | POA: Diagnosis not present

## 2014-12-17 DIAGNOSIS — D631 Anemia in chronic kidney disease: Secondary | ICD-10-CM | POA: Diagnosis not present

## 2014-12-17 DIAGNOSIS — N186 End stage renal disease: Secondary | ICD-10-CM | POA: Diagnosis not present

## 2014-12-17 DIAGNOSIS — N2581 Secondary hyperparathyroidism of renal origin: Secondary | ICD-10-CM | POA: Diagnosis not present

## 2014-12-17 DIAGNOSIS — D509 Iron deficiency anemia, unspecified: Secondary | ICD-10-CM | POA: Diagnosis not present

## 2014-12-17 DIAGNOSIS — Z23 Encounter for immunization: Secondary | ICD-10-CM | POA: Diagnosis not present

## 2014-12-20 DIAGNOSIS — D631 Anemia in chronic kidney disease: Secondary | ICD-10-CM | POA: Diagnosis not present

## 2014-12-20 DIAGNOSIS — N186 End stage renal disease: Secondary | ICD-10-CM | POA: Diagnosis not present

## 2014-12-20 DIAGNOSIS — Z23 Encounter for immunization: Secondary | ICD-10-CM | POA: Diagnosis not present

## 2014-12-20 DIAGNOSIS — D509 Iron deficiency anemia, unspecified: Secondary | ICD-10-CM | POA: Diagnosis not present

## 2014-12-20 DIAGNOSIS — N2581 Secondary hyperparathyroidism of renal origin: Secondary | ICD-10-CM | POA: Diagnosis not present

## 2014-12-22 DIAGNOSIS — N2581 Secondary hyperparathyroidism of renal origin: Secondary | ICD-10-CM | POA: Diagnosis not present

## 2014-12-22 DIAGNOSIS — D509 Iron deficiency anemia, unspecified: Secondary | ICD-10-CM | POA: Diagnosis not present

## 2014-12-22 DIAGNOSIS — N186 End stage renal disease: Secondary | ICD-10-CM | POA: Diagnosis not present

## 2014-12-22 DIAGNOSIS — D631 Anemia in chronic kidney disease: Secondary | ICD-10-CM | POA: Diagnosis not present

## 2014-12-22 DIAGNOSIS — Z23 Encounter for immunization: Secondary | ICD-10-CM | POA: Diagnosis not present

## 2014-12-24 DIAGNOSIS — N2581 Secondary hyperparathyroidism of renal origin: Secondary | ICD-10-CM | POA: Diagnosis not present

## 2014-12-24 DIAGNOSIS — N186 End stage renal disease: Secondary | ICD-10-CM | POA: Diagnosis not present

## 2014-12-24 DIAGNOSIS — Z23 Encounter for immunization: Secondary | ICD-10-CM | POA: Diagnosis not present

## 2014-12-24 DIAGNOSIS — D631 Anemia in chronic kidney disease: Secondary | ICD-10-CM | POA: Diagnosis not present

## 2014-12-24 DIAGNOSIS — D509 Iron deficiency anemia, unspecified: Secondary | ICD-10-CM | POA: Diagnosis not present

## 2014-12-25 DIAGNOSIS — N186 End stage renal disease: Secondary | ICD-10-CM | POA: Diagnosis not present

## 2014-12-25 DIAGNOSIS — T8620 Unspecified complication of heart transplant: Secondary | ICD-10-CM | POA: Diagnosis not present

## 2014-12-25 DIAGNOSIS — Z992 Dependence on renal dialysis: Secondary | ICD-10-CM | POA: Diagnosis not present

## 2014-12-27 DIAGNOSIS — N186 End stage renal disease: Secondary | ICD-10-CM | POA: Diagnosis not present

## 2014-12-27 DIAGNOSIS — N2581 Secondary hyperparathyroidism of renal origin: Secondary | ICD-10-CM | POA: Diagnosis not present

## 2014-12-27 DIAGNOSIS — D631 Anemia in chronic kidney disease: Secondary | ICD-10-CM | POA: Diagnosis not present

## 2014-12-27 DIAGNOSIS — D509 Iron deficiency anemia, unspecified: Secondary | ICD-10-CM | POA: Diagnosis not present

## 2014-12-27 DIAGNOSIS — E1151 Type 2 diabetes mellitus with diabetic peripheral angiopathy without gangrene: Secondary | ICD-10-CM | POA: Diagnosis not present

## 2014-12-29 DIAGNOSIS — D509 Iron deficiency anemia, unspecified: Secondary | ICD-10-CM | POA: Diagnosis not present

## 2014-12-29 DIAGNOSIS — N186 End stage renal disease: Secondary | ICD-10-CM | POA: Diagnosis not present

## 2014-12-29 DIAGNOSIS — N2581 Secondary hyperparathyroidism of renal origin: Secondary | ICD-10-CM | POA: Diagnosis not present

## 2014-12-29 DIAGNOSIS — D631 Anemia in chronic kidney disease: Secondary | ICD-10-CM | POA: Diagnosis not present

## 2014-12-29 DIAGNOSIS — E1151 Type 2 diabetes mellitus with diabetic peripheral angiopathy without gangrene: Secondary | ICD-10-CM | POA: Diagnosis not present

## 2014-12-31 DIAGNOSIS — E1151 Type 2 diabetes mellitus with diabetic peripheral angiopathy without gangrene: Secondary | ICD-10-CM | POA: Diagnosis not present

## 2014-12-31 DIAGNOSIS — D631 Anemia in chronic kidney disease: Secondary | ICD-10-CM | POA: Diagnosis not present

## 2014-12-31 DIAGNOSIS — D509 Iron deficiency anemia, unspecified: Secondary | ICD-10-CM | POA: Diagnosis not present

## 2014-12-31 DIAGNOSIS — N186 End stage renal disease: Secondary | ICD-10-CM | POA: Diagnosis not present

## 2014-12-31 DIAGNOSIS — N2581 Secondary hyperparathyroidism of renal origin: Secondary | ICD-10-CM | POA: Diagnosis not present

## 2015-01-03 DIAGNOSIS — N186 End stage renal disease: Secondary | ICD-10-CM | POA: Diagnosis not present

## 2015-01-03 DIAGNOSIS — D509 Iron deficiency anemia, unspecified: Secondary | ICD-10-CM | POA: Diagnosis not present

## 2015-01-03 DIAGNOSIS — E1151 Type 2 diabetes mellitus with diabetic peripheral angiopathy without gangrene: Secondary | ICD-10-CM | POA: Diagnosis not present

## 2015-01-03 DIAGNOSIS — D631 Anemia in chronic kidney disease: Secondary | ICD-10-CM | POA: Diagnosis not present

## 2015-01-03 DIAGNOSIS — N2581 Secondary hyperparathyroidism of renal origin: Secondary | ICD-10-CM | POA: Diagnosis not present

## 2015-01-05 DIAGNOSIS — E1151 Type 2 diabetes mellitus with diabetic peripheral angiopathy without gangrene: Secondary | ICD-10-CM | POA: Diagnosis not present

## 2015-01-05 DIAGNOSIS — D509 Iron deficiency anemia, unspecified: Secondary | ICD-10-CM | POA: Diagnosis not present

## 2015-01-05 DIAGNOSIS — D631 Anemia in chronic kidney disease: Secondary | ICD-10-CM | POA: Diagnosis not present

## 2015-01-05 DIAGNOSIS — N2581 Secondary hyperparathyroidism of renal origin: Secondary | ICD-10-CM | POA: Diagnosis not present

## 2015-01-05 DIAGNOSIS — N186 End stage renal disease: Secondary | ICD-10-CM | POA: Diagnosis not present

## 2015-01-07 DIAGNOSIS — N186 End stage renal disease: Secondary | ICD-10-CM | POA: Diagnosis not present

## 2015-01-07 DIAGNOSIS — D509 Iron deficiency anemia, unspecified: Secondary | ICD-10-CM | POA: Diagnosis not present

## 2015-01-07 DIAGNOSIS — N2581 Secondary hyperparathyroidism of renal origin: Secondary | ICD-10-CM | POA: Diagnosis not present

## 2015-01-07 DIAGNOSIS — E1151 Type 2 diabetes mellitus with diabetic peripheral angiopathy without gangrene: Secondary | ICD-10-CM | POA: Diagnosis not present

## 2015-01-07 DIAGNOSIS — D631 Anemia in chronic kidney disease: Secondary | ICD-10-CM | POA: Diagnosis not present

## 2015-01-10 DIAGNOSIS — N2581 Secondary hyperparathyroidism of renal origin: Secondary | ICD-10-CM | POA: Diagnosis not present

## 2015-01-10 DIAGNOSIS — D631 Anemia in chronic kidney disease: Secondary | ICD-10-CM | POA: Diagnosis not present

## 2015-01-10 DIAGNOSIS — D509 Iron deficiency anemia, unspecified: Secondary | ICD-10-CM | POA: Diagnosis not present

## 2015-01-10 DIAGNOSIS — N186 End stage renal disease: Secondary | ICD-10-CM | POA: Diagnosis not present

## 2015-01-10 DIAGNOSIS — E1151 Type 2 diabetes mellitus with diabetic peripheral angiopathy without gangrene: Secondary | ICD-10-CM | POA: Diagnosis not present

## 2015-01-12 DIAGNOSIS — D509 Iron deficiency anemia, unspecified: Secondary | ICD-10-CM | POA: Diagnosis not present

## 2015-01-12 DIAGNOSIS — D631 Anemia in chronic kidney disease: Secondary | ICD-10-CM | POA: Diagnosis not present

## 2015-01-12 DIAGNOSIS — E1151 Type 2 diabetes mellitus with diabetic peripheral angiopathy without gangrene: Secondary | ICD-10-CM | POA: Diagnosis not present

## 2015-01-12 DIAGNOSIS — N2581 Secondary hyperparathyroidism of renal origin: Secondary | ICD-10-CM | POA: Diagnosis not present

## 2015-01-12 DIAGNOSIS — N186 End stage renal disease: Secondary | ICD-10-CM | POA: Diagnosis not present

## 2015-01-14 DIAGNOSIS — D631 Anemia in chronic kidney disease: Secondary | ICD-10-CM | POA: Diagnosis not present

## 2015-01-14 DIAGNOSIS — N186 End stage renal disease: Secondary | ICD-10-CM | POA: Diagnosis not present

## 2015-01-14 DIAGNOSIS — E1151 Type 2 diabetes mellitus with diabetic peripheral angiopathy without gangrene: Secondary | ICD-10-CM | POA: Diagnosis not present

## 2015-01-14 DIAGNOSIS — D509 Iron deficiency anemia, unspecified: Secondary | ICD-10-CM | POA: Diagnosis not present

## 2015-01-14 DIAGNOSIS — N2581 Secondary hyperparathyroidism of renal origin: Secondary | ICD-10-CM | POA: Diagnosis not present

## 2015-01-17 DIAGNOSIS — D509 Iron deficiency anemia, unspecified: Secondary | ICD-10-CM | POA: Diagnosis not present

## 2015-01-17 DIAGNOSIS — E1151 Type 2 diabetes mellitus with diabetic peripheral angiopathy without gangrene: Secondary | ICD-10-CM | POA: Diagnosis not present

## 2015-01-17 DIAGNOSIS — N2581 Secondary hyperparathyroidism of renal origin: Secondary | ICD-10-CM | POA: Diagnosis not present

## 2015-01-17 DIAGNOSIS — D631 Anemia in chronic kidney disease: Secondary | ICD-10-CM | POA: Diagnosis not present

## 2015-01-17 DIAGNOSIS — N186 End stage renal disease: Secondary | ICD-10-CM | POA: Diagnosis not present

## 2015-01-19 DIAGNOSIS — N2581 Secondary hyperparathyroidism of renal origin: Secondary | ICD-10-CM | POA: Diagnosis not present

## 2015-01-19 DIAGNOSIS — D509 Iron deficiency anemia, unspecified: Secondary | ICD-10-CM | POA: Diagnosis not present

## 2015-01-19 DIAGNOSIS — E1151 Type 2 diabetes mellitus with diabetic peripheral angiopathy without gangrene: Secondary | ICD-10-CM | POA: Diagnosis not present

## 2015-01-19 DIAGNOSIS — D631 Anemia in chronic kidney disease: Secondary | ICD-10-CM | POA: Diagnosis not present

## 2015-01-19 DIAGNOSIS — N186 End stage renal disease: Secondary | ICD-10-CM | POA: Diagnosis not present

## 2015-01-20 ENCOUNTER — Other Ambulatory Visit: Payer: Self-pay | Admitting: Nephrology

## 2015-01-20 DIAGNOSIS — N186 End stage renal disease: Secondary | ICD-10-CM

## 2015-01-21 DIAGNOSIS — N2581 Secondary hyperparathyroidism of renal origin: Secondary | ICD-10-CM | POA: Diagnosis not present

## 2015-01-21 DIAGNOSIS — D509 Iron deficiency anemia, unspecified: Secondary | ICD-10-CM | POA: Diagnosis not present

## 2015-01-21 DIAGNOSIS — N186 End stage renal disease: Secondary | ICD-10-CM | POA: Diagnosis not present

## 2015-01-21 DIAGNOSIS — D631 Anemia in chronic kidney disease: Secondary | ICD-10-CM | POA: Diagnosis not present

## 2015-01-21 DIAGNOSIS — E1151 Type 2 diabetes mellitus with diabetic peripheral angiopathy without gangrene: Secondary | ICD-10-CM | POA: Diagnosis not present

## 2015-01-24 DIAGNOSIS — N2581 Secondary hyperparathyroidism of renal origin: Secondary | ICD-10-CM | POA: Diagnosis not present

## 2015-01-24 DIAGNOSIS — N186 End stage renal disease: Secondary | ICD-10-CM | POA: Diagnosis not present

## 2015-01-24 DIAGNOSIS — D509 Iron deficiency anemia, unspecified: Secondary | ICD-10-CM | POA: Diagnosis not present

## 2015-01-24 DIAGNOSIS — D631 Anemia in chronic kidney disease: Secondary | ICD-10-CM | POA: Diagnosis not present

## 2015-01-24 DIAGNOSIS — E1151 Type 2 diabetes mellitus with diabetic peripheral angiopathy without gangrene: Secondary | ICD-10-CM | POA: Diagnosis not present

## 2015-01-25 DIAGNOSIS — T8620 Unspecified complication of heart transplant: Secondary | ICD-10-CM | POA: Diagnosis not present

## 2015-01-25 DIAGNOSIS — Z992 Dependence on renal dialysis: Secondary | ICD-10-CM | POA: Diagnosis not present

## 2015-01-25 DIAGNOSIS — N186 End stage renal disease: Secondary | ICD-10-CM | POA: Diagnosis not present

## 2015-01-26 DIAGNOSIS — D509 Iron deficiency anemia, unspecified: Secondary | ICD-10-CM | POA: Diagnosis not present

## 2015-01-26 DIAGNOSIS — N2581 Secondary hyperparathyroidism of renal origin: Secondary | ICD-10-CM | POA: Diagnosis not present

## 2015-01-26 DIAGNOSIS — D631 Anemia in chronic kidney disease: Secondary | ICD-10-CM | POA: Diagnosis not present

## 2015-01-26 DIAGNOSIS — N186 End stage renal disease: Secondary | ICD-10-CM | POA: Diagnosis not present

## 2015-01-26 DIAGNOSIS — E1151 Type 2 diabetes mellitus with diabetic peripheral angiopathy without gangrene: Secondary | ICD-10-CM | POA: Diagnosis not present

## 2015-01-26 DIAGNOSIS — D508 Other iron deficiency anemias: Secondary | ICD-10-CM | POA: Diagnosis not present

## 2015-01-27 ENCOUNTER — Inpatient Hospital Stay: Admission: RE | Admit: 2015-01-27 | Payer: Medicare Other | Source: Ambulatory Visit

## 2015-01-28 DIAGNOSIS — N2581 Secondary hyperparathyroidism of renal origin: Secondary | ICD-10-CM | POA: Diagnosis not present

## 2015-01-28 DIAGNOSIS — N186 End stage renal disease: Secondary | ICD-10-CM | POA: Diagnosis not present

## 2015-01-28 DIAGNOSIS — D508 Other iron deficiency anemias: Secondary | ICD-10-CM | POA: Diagnosis not present

## 2015-01-28 DIAGNOSIS — E1151 Type 2 diabetes mellitus with diabetic peripheral angiopathy without gangrene: Secondary | ICD-10-CM | POA: Diagnosis not present

## 2015-01-28 DIAGNOSIS — D509 Iron deficiency anemia, unspecified: Secondary | ICD-10-CM | POA: Diagnosis not present

## 2015-01-28 DIAGNOSIS — D631 Anemia in chronic kidney disease: Secondary | ICD-10-CM | POA: Diagnosis not present

## 2015-01-31 DIAGNOSIS — D508 Other iron deficiency anemias: Secondary | ICD-10-CM | POA: Diagnosis not present

## 2015-01-31 DIAGNOSIS — N2581 Secondary hyperparathyroidism of renal origin: Secondary | ICD-10-CM | POA: Diagnosis not present

## 2015-01-31 DIAGNOSIS — D509 Iron deficiency anemia, unspecified: Secondary | ICD-10-CM | POA: Diagnosis not present

## 2015-01-31 DIAGNOSIS — E1151 Type 2 diabetes mellitus with diabetic peripheral angiopathy without gangrene: Secondary | ICD-10-CM | POA: Diagnosis not present

## 2015-01-31 DIAGNOSIS — D631 Anemia in chronic kidney disease: Secondary | ICD-10-CM | POA: Diagnosis not present

## 2015-01-31 DIAGNOSIS — N186 End stage renal disease: Secondary | ICD-10-CM | POA: Diagnosis not present

## 2015-02-02 DIAGNOSIS — E1151 Type 2 diabetes mellitus with diabetic peripheral angiopathy without gangrene: Secondary | ICD-10-CM | POA: Diagnosis not present

## 2015-02-02 DIAGNOSIS — N186 End stage renal disease: Secondary | ICD-10-CM | POA: Diagnosis not present

## 2015-02-02 DIAGNOSIS — D631 Anemia in chronic kidney disease: Secondary | ICD-10-CM | POA: Diagnosis not present

## 2015-02-02 DIAGNOSIS — D509 Iron deficiency anemia, unspecified: Secondary | ICD-10-CM | POA: Diagnosis not present

## 2015-02-02 DIAGNOSIS — N2581 Secondary hyperparathyroidism of renal origin: Secondary | ICD-10-CM | POA: Diagnosis not present

## 2015-02-02 DIAGNOSIS — D508 Other iron deficiency anemias: Secondary | ICD-10-CM | POA: Diagnosis not present

## 2015-02-04 DIAGNOSIS — D508 Other iron deficiency anemias: Secondary | ICD-10-CM | POA: Diagnosis not present

## 2015-02-04 DIAGNOSIS — D509 Iron deficiency anemia, unspecified: Secondary | ICD-10-CM | POA: Diagnosis not present

## 2015-02-04 DIAGNOSIS — N2581 Secondary hyperparathyroidism of renal origin: Secondary | ICD-10-CM | POA: Diagnosis not present

## 2015-02-04 DIAGNOSIS — D631 Anemia in chronic kidney disease: Secondary | ICD-10-CM | POA: Diagnosis not present

## 2015-02-04 DIAGNOSIS — N186 End stage renal disease: Secondary | ICD-10-CM | POA: Diagnosis not present

## 2015-02-04 DIAGNOSIS — E1151 Type 2 diabetes mellitus with diabetic peripheral angiopathy without gangrene: Secondary | ICD-10-CM | POA: Diagnosis not present

## 2015-02-07 DIAGNOSIS — D509 Iron deficiency anemia, unspecified: Secondary | ICD-10-CM | POA: Diagnosis not present

## 2015-02-07 DIAGNOSIS — E1151 Type 2 diabetes mellitus with diabetic peripheral angiopathy without gangrene: Secondary | ICD-10-CM | POA: Diagnosis not present

## 2015-02-07 DIAGNOSIS — N186 End stage renal disease: Secondary | ICD-10-CM | POA: Diagnosis not present

## 2015-02-07 DIAGNOSIS — N2581 Secondary hyperparathyroidism of renal origin: Secondary | ICD-10-CM | POA: Diagnosis not present

## 2015-02-07 DIAGNOSIS — D631 Anemia in chronic kidney disease: Secondary | ICD-10-CM | POA: Diagnosis not present

## 2015-02-07 DIAGNOSIS — D508 Other iron deficiency anemias: Secondary | ICD-10-CM | POA: Diagnosis not present

## 2015-02-09 DIAGNOSIS — N186 End stage renal disease: Secondary | ICD-10-CM | POA: Diagnosis not present

## 2015-02-09 DIAGNOSIS — E1151 Type 2 diabetes mellitus with diabetic peripheral angiopathy without gangrene: Secondary | ICD-10-CM | POA: Diagnosis not present

## 2015-02-09 DIAGNOSIS — D509 Iron deficiency anemia, unspecified: Secondary | ICD-10-CM | POA: Diagnosis not present

## 2015-02-09 DIAGNOSIS — D508 Other iron deficiency anemias: Secondary | ICD-10-CM | POA: Diagnosis not present

## 2015-02-09 DIAGNOSIS — N2581 Secondary hyperparathyroidism of renal origin: Secondary | ICD-10-CM | POA: Diagnosis not present

## 2015-02-09 DIAGNOSIS — D631 Anemia in chronic kidney disease: Secondary | ICD-10-CM | POA: Diagnosis not present

## 2015-02-10 ENCOUNTER — Ambulatory Visit: Payer: Medicare Other | Admitting: Physician Assistant

## 2015-02-11 DIAGNOSIS — D508 Other iron deficiency anemias: Secondary | ICD-10-CM | POA: Diagnosis not present

## 2015-02-11 DIAGNOSIS — D509 Iron deficiency anemia, unspecified: Secondary | ICD-10-CM | POA: Diagnosis not present

## 2015-02-11 DIAGNOSIS — D631 Anemia in chronic kidney disease: Secondary | ICD-10-CM | POA: Diagnosis not present

## 2015-02-11 DIAGNOSIS — N186 End stage renal disease: Secondary | ICD-10-CM | POA: Diagnosis not present

## 2015-02-11 DIAGNOSIS — N2581 Secondary hyperparathyroidism of renal origin: Secondary | ICD-10-CM | POA: Diagnosis not present

## 2015-02-11 DIAGNOSIS — E1151 Type 2 diabetes mellitus with diabetic peripheral angiopathy without gangrene: Secondary | ICD-10-CM | POA: Diagnosis not present

## 2015-02-14 DIAGNOSIS — D509 Iron deficiency anemia, unspecified: Secondary | ICD-10-CM | POA: Diagnosis not present

## 2015-02-14 DIAGNOSIS — N186 End stage renal disease: Secondary | ICD-10-CM | POA: Diagnosis not present

## 2015-02-14 DIAGNOSIS — D508 Other iron deficiency anemias: Secondary | ICD-10-CM | POA: Diagnosis not present

## 2015-02-14 DIAGNOSIS — E1151 Type 2 diabetes mellitus with diabetic peripheral angiopathy without gangrene: Secondary | ICD-10-CM | POA: Diagnosis not present

## 2015-02-14 DIAGNOSIS — N2581 Secondary hyperparathyroidism of renal origin: Secondary | ICD-10-CM | POA: Diagnosis not present

## 2015-02-14 DIAGNOSIS — D631 Anemia in chronic kidney disease: Secondary | ICD-10-CM | POA: Diagnosis not present

## 2015-02-15 DIAGNOSIS — R202 Paresthesia of skin: Secondary | ICD-10-CM | POA: Diagnosis not present

## 2015-02-15 DIAGNOSIS — E119 Type 2 diabetes mellitus without complications: Secondary | ICD-10-CM | POA: Diagnosis not present

## 2015-02-15 DIAGNOSIS — G629 Polyneuropathy, unspecified: Secondary | ICD-10-CM | POA: Diagnosis not present

## 2015-02-16 DIAGNOSIS — N2581 Secondary hyperparathyroidism of renal origin: Secondary | ICD-10-CM | POA: Diagnosis not present

## 2015-02-16 DIAGNOSIS — N186 End stage renal disease: Secondary | ICD-10-CM | POA: Diagnosis not present

## 2015-02-16 DIAGNOSIS — E1151 Type 2 diabetes mellitus with diabetic peripheral angiopathy without gangrene: Secondary | ICD-10-CM | POA: Diagnosis not present

## 2015-02-16 DIAGNOSIS — D509 Iron deficiency anemia, unspecified: Secondary | ICD-10-CM | POA: Diagnosis not present

## 2015-02-16 DIAGNOSIS — D631 Anemia in chronic kidney disease: Secondary | ICD-10-CM | POA: Diagnosis not present

## 2015-02-16 DIAGNOSIS — D508 Other iron deficiency anemias: Secondary | ICD-10-CM | POA: Diagnosis not present

## 2015-02-17 ENCOUNTER — Ambulatory Visit
Admission: RE | Admit: 2015-02-17 | Discharge: 2015-02-17 | Disposition: A | Discharge status: Home / Self Care | Payer: Medicare Other | Source: Ambulatory Visit | Attending: Nephrology | Admitting: Nephrology

## 2015-02-17 DIAGNOSIS — N189 Chronic kidney disease, unspecified: Secondary | ICD-10-CM | POA: Diagnosis not present

## 2015-02-17 DIAGNOSIS — N200 Calculus of kidney: Secondary | ICD-10-CM | POA: Diagnosis not present

## 2015-02-17 DIAGNOSIS — N289 Disorder of kidney and ureter, unspecified: Secondary | ICD-10-CM | POA: Diagnosis not present

## 2015-02-17 DIAGNOSIS — N400 Enlarged prostate without lower urinary tract symptoms: Secondary | ICD-10-CM | POA: Diagnosis not present

## 2015-02-17 DIAGNOSIS — N186 End stage renal disease: Secondary | ICD-10-CM

## 2015-02-17 MED ORDER — IOPAMIDOL (ISOVUE-370) INJECTION 76%
125.0000 mL | Freq: Once | INTRAVENOUS | Status: AC | PRN
  Administered 2015-02-17: 125 mL via INTRAVENOUS

## 2015-02-18 DIAGNOSIS — E1151 Type 2 diabetes mellitus with diabetic peripheral angiopathy without gangrene: Secondary | ICD-10-CM | POA: Diagnosis not present

## 2015-02-18 DIAGNOSIS — D631 Anemia in chronic kidney disease: Secondary | ICD-10-CM | POA: Diagnosis not present

## 2015-02-18 DIAGNOSIS — D509 Iron deficiency anemia, unspecified: Secondary | ICD-10-CM | POA: Diagnosis not present

## 2015-02-18 DIAGNOSIS — D508 Other iron deficiency anemias: Secondary | ICD-10-CM | POA: Diagnosis not present

## 2015-02-18 DIAGNOSIS — N2581 Secondary hyperparathyroidism of renal origin: Secondary | ICD-10-CM | POA: Diagnosis not present

## 2015-02-18 DIAGNOSIS — N186 End stage renal disease: Secondary | ICD-10-CM | POA: Diagnosis not present

## 2015-02-21 DIAGNOSIS — N186 End stage renal disease: Secondary | ICD-10-CM | POA: Diagnosis not present

## 2015-02-21 DIAGNOSIS — D509 Iron deficiency anemia, unspecified: Secondary | ICD-10-CM | POA: Diagnosis not present

## 2015-02-21 DIAGNOSIS — D631 Anemia in chronic kidney disease: Secondary | ICD-10-CM | POA: Diagnosis not present

## 2015-02-21 DIAGNOSIS — N2581 Secondary hyperparathyroidism of renal origin: Secondary | ICD-10-CM | POA: Diagnosis not present

## 2015-02-21 DIAGNOSIS — E1151 Type 2 diabetes mellitus with diabetic peripheral angiopathy without gangrene: Secondary | ICD-10-CM | POA: Diagnosis not present

## 2015-02-21 DIAGNOSIS — D508 Other iron deficiency anemias: Secondary | ICD-10-CM | POA: Diagnosis not present

## 2015-02-23 DIAGNOSIS — N2581 Secondary hyperparathyroidism of renal origin: Secondary | ICD-10-CM | POA: Diagnosis not present

## 2015-02-23 DIAGNOSIS — N186 End stage renal disease: Secondary | ICD-10-CM | POA: Diagnosis not present

## 2015-02-23 DIAGNOSIS — D508 Other iron deficiency anemias: Secondary | ICD-10-CM | POA: Diagnosis not present

## 2015-02-23 DIAGNOSIS — D631 Anemia in chronic kidney disease: Secondary | ICD-10-CM | POA: Diagnosis not present

## 2015-02-23 DIAGNOSIS — D509 Iron deficiency anemia, unspecified: Secondary | ICD-10-CM | POA: Diagnosis not present

## 2015-02-23 DIAGNOSIS — E1151 Type 2 diabetes mellitus with diabetic peripheral angiopathy without gangrene: Secondary | ICD-10-CM | POA: Diagnosis not present

## 2015-02-24 DIAGNOSIS — N186 End stage renal disease: Secondary | ICD-10-CM | POA: Diagnosis not present

## 2015-02-24 DIAGNOSIS — T8620 Unspecified complication of heart transplant: Secondary | ICD-10-CM | POA: Diagnosis not present

## 2015-02-24 DIAGNOSIS — Z992 Dependence on renal dialysis: Secondary | ICD-10-CM | POA: Diagnosis not present

## 2015-02-26 DIAGNOSIS — N186 End stage renal disease: Secondary | ICD-10-CM | POA: Diagnosis not present

## 2015-03-01 DIAGNOSIS — N186 End stage renal disease: Secondary | ICD-10-CM | POA: Diagnosis not present

## 2015-03-01 DIAGNOSIS — E1151 Type 2 diabetes mellitus with diabetic peripheral angiopathy without gangrene: Secondary | ICD-10-CM | POA: Diagnosis not present

## 2015-03-01 DIAGNOSIS — D631 Anemia in chronic kidney disease: Secondary | ICD-10-CM | POA: Diagnosis not present

## 2015-03-01 DIAGNOSIS — D508 Other iron deficiency anemias: Secondary | ICD-10-CM | POA: Diagnosis not present

## 2015-03-01 DIAGNOSIS — N2581 Secondary hyperparathyroidism of renal origin: Secondary | ICD-10-CM | POA: Diagnosis not present

## 2015-03-02 DIAGNOSIS — N186 End stage renal disease: Secondary | ICD-10-CM | POA: Diagnosis not present

## 2015-03-02 DIAGNOSIS — D508 Other iron deficiency anemias: Secondary | ICD-10-CM | POA: Diagnosis not present

## 2015-03-02 DIAGNOSIS — E1151 Type 2 diabetes mellitus with diabetic peripheral angiopathy without gangrene: Secondary | ICD-10-CM | POA: Diagnosis not present

## 2015-03-02 DIAGNOSIS — D631 Anemia in chronic kidney disease: Secondary | ICD-10-CM | POA: Diagnosis not present

## 2015-03-02 DIAGNOSIS — N2581 Secondary hyperparathyroidism of renal origin: Secondary | ICD-10-CM | POA: Diagnosis not present

## 2015-03-04 DIAGNOSIS — D631 Anemia in chronic kidney disease: Secondary | ICD-10-CM | POA: Diagnosis not present

## 2015-03-04 DIAGNOSIS — N2581 Secondary hyperparathyroidism of renal origin: Secondary | ICD-10-CM | POA: Diagnosis not present

## 2015-03-04 DIAGNOSIS — D508 Other iron deficiency anemias: Secondary | ICD-10-CM | POA: Diagnosis not present

## 2015-03-04 DIAGNOSIS — N186 End stage renal disease: Secondary | ICD-10-CM | POA: Diagnosis not present

## 2015-03-04 DIAGNOSIS — E1151 Type 2 diabetes mellitus with diabetic peripheral angiopathy without gangrene: Secondary | ICD-10-CM | POA: Diagnosis not present

## 2015-03-07 DIAGNOSIS — D508 Other iron deficiency anemias: Secondary | ICD-10-CM | POA: Diagnosis not present

## 2015-03-07 DIAGNOSIS — N186 End stage renal disease: Secondary | ICD-10-CM | POA: Diagnosis not present

## 2015-03-07 DIAGNOSIS — D631 Anemia in chronic kidney disease: Secondary | ICD-10-CM | POA: Diagnosis not present

## 2015-03-07 DIAGNOSIS — N2581 Secondary hyperparathyroidism of renal origin: Secondary | ICD-10-CM | POA: Diagnosis not present

## 2015-03-07 DIAGNOSIS — E1151 Type 2 diabetes mellitus with diabetic peripheral angiopathy without gangrene: Secondary | ICD-10-CM | POA: Diagnosis not present

## 2015-03-09 DIAGNOSIS — N2581 Secondary hyperparathyroidism of renal origin: Secondary | ICD-10-CM | POA: Diagnosis not present

## 2015-03-09 DIAGNOSIS — N186 End stage renal disease: Secondary | ICD-10-CM | POA: Diagnosis not present

## 2015-03-09 DIAGNOSIS — E1151 Type 2 diabetes mellitus with diabetic peripheral angiopathy without gangrene: Secondary | ICD-10-CM | POA: Diagnosis not present

## 2015-03-09 DIAGNOSIS — D631 Anemia in chronic kidney disease: Secondary | ICD-10-CM | POA: Diagnosis not present

## 2015-03-09 DIAGNOSIS — D508 Other iron deficiency anemias: Secondary | ICD-10-CM | POA: Diagnosis not present

## 2015-03-11 DIAGNOSIS — D631 Anemia in chronic kidney disease: Secondary | ICD-10-CM | POA: Diagnosis not present

## 2015-03-11 DIAGNOSIS — E1151 Type 2 diabetes mellitus with diabetic peripheral angiopathy without gangrene: Secondary | ICD-10-CM | POA: Diagnosis not present

## 2015-03-11 DIAGNOSIS — D508 Other iron deficiency anemias: Secondary | ICD-10-CM | POA: Diagnosis not present

## 2015-03-11 DIAGNOSIS — N186 End stage renal disease: Secondary | ICD-10-CM | POA: Diagnosis not present

## 2015-03-11 DIAGNOSIS — N2581 Secondary hyperparathyroidism of renal origin: Secondary | ICD-10-CM | POA: Diagnosis not present

## 2015-03-14 DIAGNOSIS — N186 End stage renal disease: Secondary | ICD-10-CM | POA: Diagnosis not present

## 2015-03-14 DIAGNOSIS — D508 Other iron deficiency anemias: Secondary | ICD-10-CM | POA: Diagnosis not present

## 2015-03-14 DIAGNOSIS — E1151 Type 2 diabetes mellitus with diabetic peripheral angiopathy without gangrene: Secondary | ICD-10-CM | POA: Diagnosis not present

## 2015-03-14 DIAGNOSIS — N2581 Secondary hyperparathyroidism of renal origin: Secondary | ICD-10-CM | POA: Diagnosis not present

## 2015-03-14 DIAGNOSIS — D631 Anemia in chronic kidney disease: Secondary | ICD-10-CM | POA: Diagnosis not present

## 2015-03-16 DIAGNOSIS — E1151 Type 2 diabetes mellitus with diabetic peripheral angiopathy without gangrene: Secondary | ICD-10-CM | POA: Diagnosis not present

## 2015-03-16 DIAGNOSIS — N2581 Secondary hyperparathyroidism of renal origin: Secondary | ICD-10-CM | POA: Diagnosis not present

## 2015-03-16 DIAGNOSIS — N186 End stage renal disease: Secondary | ICD-10-CM | POA: Diagnosis not present

## 2015-03-16 DIAGNOSIS — D508 Other iron deficiency anemias: Secondary | ICD-10-CM | POA: Diagnosis not present

## 2015-03-16 DIAGNOSIS — D631 Anemia in chronic kidney disease: Secondary | ICD-10-CM | POA: Diagnosis not present

## 2015-03-18 DIAGNOSIS — N186 End stage renal disease: Secondary | ICD-10-CM | POA: Diagnosis not present

## 2015-03-18 DIAGNOSIS — D508 Other iron deficiency anemias: Secondary | ICD-10-CM | POA: Diagnosis not present

## 2015-03-18 DIAGNOSIS — E1151 Type 2 diabetes mellitus with diabetic peripheral angiopathy without gangrene: Secondary | ICD-10-CM | POA: Diagnosis not present

## 2015-03-18 DIAGNOSIS — N2581 Secondary hyperparathyroidism of renal origin: Secondary | ICD-10-CM | POA: Diagnosis not present

## 2015-03-18 DIAGNOSIS — D631 Anemia in chronic kidney disease: Secondary | ICD-10-CM | POA: Diagnosis not present

## 2015-03-21 DIAGNOSIS — E1151 Type 2 diabetes mellitus with diabetic peripheral angiopathy without gangrene: Secondary | ICD-10-CM | POA: Diagnosis not present

## 2015-03-21 DIAGNOSIS — N2581 Secondary hyperparathyroidism of renal origin: Secondary | ICD-10-CM | POA: Diagnosis not present

## 2015-03-21 DIAGNOSIS — N186 End stage renal disease: Secondary | ICD-10-CM | POA: Diagnosis not present

## 2015-03-21 DIAGNOSIS — D508 Other iron deficiency anemias: Secondary | ICD-10-CM | POA: Diagnosis not present

## 2015-03-21 DIAGNOSIS — D631 Anemia in chronic kidney disease: Secondary | ICD-10-CM | POA: Diagnosis not present

## 2015-03-23 DIAGNOSIS — D631 Anemia in chronic kidney disease: Secondary | ICD-10-CM | POA: Diagnosis not present

## 2015-03-23 DIAGNOSIS — N186 End stage renal disease: Secondary | ICD-10-CM | POA: Diagnosis not present

## 2015-03-23 DIAGNOSIS — D508 Other iron deficiency anemias: Secondary | ICD-10-CM | POA: Diagnosis not present

## 2015-03-23 DIAGNOSIS — E1151 Type 2 diabetes mellitus with diabetic peripheral angiopathy without gangrene: Secondary | ICD-10-CM | POA: Diagnosis not present

## 2015-03-23 DIAGNOSIS — N2581 Secondary hyperparathyroidism of renal origin: Secondary | ICD-10-CM | POA: Diagnosis not present

## 2015-03-24 DIAGNOSIS — Z4821 Encounter for aftercare following heart transplant: Secondary | ICD-10-CM | POA: Diagnosis not present

## 2015-03-24 DIAGNOSIS — R918 Other nonspecific abnormal finding of lung field: Secondary | ICD-10-CM | POA: Diagnosis not present

## 2015-03-24 DIAGNOSIS — Z941 Heart transplant status: Secondary | ICD-10-CM | POA: Diagnosis not present

## 2015-03-24 DIAGNOSIS — Z79899 Other long term (current) drug therapy: Secondary | ICD-10-CM | POA: Diagnosis not present

## 2015-03-24 DIAGNOSIS — I120 Hypertensive chronic kidney disease with stage 5 chronic kidney disease or end stage renal disease: Secondary | ICD-10-CM | POA: Diagnosis not present

## 2015-03-24 DIAGNOSIS — N186 End stage renal disease: Secondary | ICD-10-CM | POA: Diagnosis not present

## 2015-03-24 DIAGNOSIS — Z48298 Encounter for aftercare following other organ transplant: Secondary | ICD-10-CM | POA: Diagnosis not present

## 2015-03-24 DIAGNOSIS — Z125 Encounter for screening for malignant neoplasm of prostate: Secondary | ICD-10-CM | POA: Diagnosis not present

## 2015-03-24 DIAGNOSIS — I340 Nonrheumatic mitral (valve) insufficiency: Secondary | ICD-10-CM | POA: Diagnosis not present

## 2015-03-24 DIAGNOSIS — Z992 Dependence on renal dialysis: Secondary | ICD-10-CM | POA: Diagnosis not present

## 2015-03-24 DIAGNOSIS — J9811 Atelectasis: Secondary | ICD-10-CM | POA: Diagnosis not present

## 2015-03-24 DIAGNOSIS — I517 Cardiomegaly: Secondary | ICD-10-CM | POA: Diagnosis not present

## 2015-03-25 DIAGNOSIS — D631 Anemia in chronic kidney disease: Secondary | ICD-10-CM | POA: Diagnosis not present

## 2015-03-25 DIAGNOSIS — E1151 Type 2 diabetes mellitus with diabetic peripheral angiopathy without gangrene: Secondary | ICD-10-CM | POA: Diagnosis not present

## 2015-03-25 DIAGNOSIS — N186 End stage renal disease: Secondary | ICD-10-CM | POA: Diagnosis not present

## 2015-03-25 DIAGNOSIS — N2581 Secondary hyperparathyroidism of renal origin: Secondary | ICD-10-CM | POA: Diagnosis not present

## 2015-03-25 DIAGNOSIS — D508 Other iron deficiency anemias: Secondary | ICD-10-CM | POA: Diagnosis not present

## 2015-03-27 DIAGNOSIS — T8620 Unspecified complication of heart transplant: Secondary | ICD-10-CM | POA: Diagnosis not present

## 2015-03-27 DIAGNOSIS — N186 End stage renal disease: Secondary | ICD-10-CM | POA: Diagnosis not present

## 2015-03-27 DIAGNOSIS — Z992 Dependence on renal dialysis: Secondary | ICD-10-CM | POA: Diagnosis not present

## 2015-03-28 DIAGNOSIS — D508 Other iron deficiency anemias: Secondary | ICD-10-CM | POA: Diagnosis not present

## 2015-03-28 DIAGNOSIS — D631 Anemia in chronic kidney disease: Secondary | ICD-10-CM | POA: Diagnosis not present

## 2015-03-28 DIAGNOSIS — N2581 Secondary hyperparathyroidism of renal origin: Secondary | ICD-10-CM | POA: Diagnosis not present

## 2015-03-28 DIAGNOSIS — N186 End stage renal disease: Secondary | ICD-10-CM | POA: Diagnosis not present

## 2015-03-28 DIAGNOSIS — E8779 Other fluid overload: Secondary | ICD-10-CM | POA: Diagnosis not present

## 2015-03-28 DIAGNOSIS — D509 Iron deficiency anemia, unspecified: Secondary | ICD-10-CM | POA: Diagnosis not present

## 2015-03-28 DIAGNOSIS — E1151 Type 2 diabetes mellitus with diabetic peripheral angiopathy without gangrene: Secondary | ICD-10-CM | POA: Diagnosis not present

## 2015-03-30 DIAGNOSIS — D631 Anemia in chronic kidney disease: Secondary | ICD-10-CM | POA: Diagnosis not present

## 2015-03-30 DIAGNOSIS — N186 End stage renal disease: Secondary | ICD-10-CM | POA: Diagnosis not present

## 2015-03-30 DIAGNOSIS — D508 Other iron deficiency anemias: Secondary | ICD-10-CM | POA: Diagnosis not present

## 2015-03-30 DIAGNOSIS — E1151 Type 2 diabetes mellitus with diabetic peripheral angiopathy without gangrene: Secondary | ICD-10-CM | POA: Diagnosis not present

## 2015-03-30 DIAGNOSIS — N2581 Secondary hyperparathyroidism of renal origin: Secondary | ICD-10-CM | POA: Diagnosis not present

## 2015-03-30 DIAGNOSIS — D509 Iron deficiency anemia, unspecified: Secondary | ICD-10-CM | POA: Diagnosis not present

## 2015-04-01 DIAGNOSIS — N2581 Secondary hyperparathyroidism of renal origin: Secondary | ICD-10-CM | POA: Diagnosis not present

## 2015-04-01 DIAGNOSIS — D508 Other iron deficiency anemias: Secondary | ICD-10-CM | POA: Diagnosis not present

## 2015-04-01 DIAGNOSIS — N186 End stage renal disease: Secondary | ICD-10-CM | POA: Diagnosis not present

## 2015-04-01 DIAGNOSIS — D509 Iron deficiency anemia, unspecified: Secondary | ICD-10-CM | POA: Diagnosis not present

## 2015-04-01 DIAGNOSIS — D631 Anemia in chronic kidney disease: Secondary | ICD-10-CM | POA: Diagnosis not present

## 2015-04-01 DIAGNOSIS — E1151 Type 2 diabetes mellitus with diabetic peripheral angiopathy without gangrene: Secondary | ICD-10-CM | POA: Diagnosis not present

## 2015-04-04 DIAGNOSIS — D631 Anemia in chronic kidney disease: Secondary | ICD-10-CM | POA: Diagnosis not present

## 2015-04-04 DIAGNOSIS — D508 Other iron deficiency anemias: Secondary | ICD-10-CM | POA: Diagnosis not present

## 2015-04-04 DIAGNOSIS — D509 Iron deficiency anemia, unspecified: Secondary | ICD-10-CM | POA: Diagnosis not present

## 2015-04-04 DIAGNOSIS — E1151 Type 2 diabetes mellitus with diabetic peripheral angiopathy without gangrene: Secondary | ICD-10-CM | POA: Diagnosis not present

## 2015-04-04 DIAGNOSIS — N186 End stage renal disease: Secondary | ICD-10-CM | POA: Diagnosis not present

## 2015-04-04 DIAGNOSIS — N2581 Secondary hyperparathyroidism of renal origin: Secondary | ICD-10-CM | POA: Diagnosis not present

## 2015-04-06 DIAGNOSIS — D509 Iron deficiency anemia, unspecified: Secondary | ICD-10-CM | POA: Diagnosis not present

## 2015-04-06 DIAGNOSIS — D508 Other iron deficiency anemias: Secondary | ICD-10-CM | POA: Diagnosis not present

## 2015-04-06 DIAGNOSIS — D631 Anemia in chronic kidney disease: Secondary | ICD-10-CM | POA: Diagnosis not present

## 2015-04-06 DIAGNOSIS — N2581 Secondary hyperparathyroidism of renal origin: Secondary | ICD-10-CM | POA: Diagnosis not present

## 2015-04-06 DIAGNOSIS — E1151 Type 2 diabetes mellitus with diabetic peripheral angiopathy without gangrene: Secondary | ICD-10-CM | POA: Diagnosis not present

## 2015-04-06 DIAGNOSIS — N186 End stage renal disease: Secondary | ICD-10-CM | POA: Diagnosis not present

## 2015-04-08 DIAGNOSIS — D631 Anemia in chronic kidney disease: Secondary | ICD-10-CM | POA: Diagnosis not present

## 2015-04-08 DIAGNOSIS — E1151 Type 2 diabetes mellitus with diabetic peripheral angiopathy without gangrene: Secondary | ICD-10-CM | POA: Diagnosis not present

## 2015-04-08 DIAGNOSIS — N186 End stage renal disease: Secondary | ICD-10-CM | POA: Diagnosis not present

## 2015-04-08 DIAGNOSIS — N2581 Secondary hyperparathyroidism of renal origin: Secondary | ICD-10-CM | POA: Diagnosis not present

## 2015-04-08 DIAGNOSIS — D508 Other iron deficiency anemias: Secondary | ICD-10-CM | POA: Diagnosis not present

## 2015-04-08 DIAGNOSIS — D509 Iron deficiency anemia, unspecified: Secondary | ICD-10-CM | POA: Diagnosis not present

## 2015-04-11 DIAGNOSIS — D508 Other iron deficiency anemias: Secondary | ICD-10-CM | POA: Diagnosis not present

## 2015-04-11 DIAGNOSIS — D509 Iron deficiency anemia, unspecified: Secondary | ICD-10-CM | POA: Diagnosis not present

## 2015-04-11 DIAGNOSIS — D631 Anemia in chronic kidney disease: Secondary | ICD-10-CM | POA: Diagnosis not present

## 2015-04-11 DIAGNOSIS — E1151 Type 2 diabetes mellitus with diabetic peripheral angiopathy without gangrene: Secondary | ICD-10-CM | POA: Diagnosis not present

## 2015-04-11 DIAGNOSIS — N2581 Secondary hyperparathyroidism of renal origin: Secondary | ICD-10-CM | POA: Diagnosis not present

## 2015-04-11 DIAGNOSIS — N186 End stage renal disease: Secondary | ICD-10-CM | POA: Diagnosis not present

## 2015-04-13 DIAGNOSIS — N186 End stage renal disease: Secondary | ICD-10-CM | POA: Diagnosis not present

## 2015-04-13 DIAGNOSIS — D508 Other iron deficiency anemias: Secondary | ICD-10-CM | POA: Diagnosis not present

## 2015-04-13 DIAGNOSIS — D509 Iron deficiency anemia, unspecified: Secondary | ICD-10-CM | POA: Diagnosis not present

## 2015-04-13 DIAGNOSIS — N2581 Secondary hyperparathyroidism of renal origin: Secondary | ICD-10-CM | POA: Diagnosis not present

## 2015-04-13 DIAGNOSIS — D631 Anemia in chronic kidney disease: Secondary | ICD-10-CM | POA: Diagnosis not present

## 2015-04-13 DIAGNOSIS — E1151 Type 2 diabetes mellitus with diabetic peripheral angiopathy without gangrene: Secondary | ICD-10-CM | POA: Diagnosis not present

## 2015-04-15 DIAGNOSIS — N2581 Secondary hyperparathyroidism of renal origin: Secondary | ICD-10-CM | POA: Diagnosis not present

## 2015-04-15 DIAGNOSIS — D509 Iron deficiency anemia, unspecified: Secondary | ICD-10-CM | POA: Diagnosis not present

## 2015-04-15 DIAGNOSIS — D631 Anemia in chronic kidney disease: Secondary | ICD-10-CM | POA: Diagnosis not present

## 2015-04-15 DIAGNOSIS — E1151 Type 2 diabetes mellitus with diabetic peripheral angiopathy without gangrene: Secondary | ICD-10-CM | POA: Diagnosis not present

## 2015-04-15 DIAGNOSIS — D508 Other iron deficiency anemias: Secondary | ICD-10-CM | POA: Diagnosis not present

## 2015-04-15 DIAGNOSIS — N186 End stage renal disease: Secondary | ICD-10-CM | POA: Diagnosis not present

## 2015-04-18 DIAGNOSIS — N186 End stage renal disease: Secondary | ICD-10-CM | POA: Diagnosis not present

## 2015-04-18 DIAGNOSIS — E1151 Type 2 diabetes mellitus with diabetic peripheral angiopathy without gangrene: Secondary | ICD-10-CM | POA: Diagnosis not present

## 2015-04-18 DIAGNOSIS — N2581 Secondary hyperparathyroidism of renal origin: Secondary | ICD-10-CM | POA: Diagnosis not present

## 2015-04-18 DIAGNOSIS — D508 Other iron deficiency anemias: Secondary | ICD-10-CM | POA: Diagnosis not present

## 2015-04-18 DIAGNOSIS — D509 Iron deficiency anemia, unspecified: Secondary | ICD-10-CM | POA: Diagnosis not present

## 2015-04-18 DIAGNOSIS — D631 Anemia in chronic kidney disease: Secondary | ICD-10-CM | POA: Diagnosis not present

## 2015-04-20 DIAGNOSIS — D509 Iron deficiency anemia, unspecified: Secondary | ICD-10-CM | POA: Diagnosis not present

## 2015-04-20 DIAGNOSIS — D631 Anemia in chronic kidney disease: Secondary | ICD-10-CM | POA: Diagnosis not present

## 2015-04-20 DIAGNOSIS — D508 Other iron deficiency anemias: Secondary | ICD-10-CM | POA: Diagnosis not present

## 2015-04-20 DIAGNOSIS — N2581 Secondary hyperparathyroidism of renal origin: Secondary | ICD-10-CM | POA: Diagnosis not present

## 2015-04-20 DIAGNOSIS — E1151 Type 2 diabetes mellitus with diabetic peripheral angiopathy without gangrene: Secondary | ICD-10-CM | POA: Diagnosis not present

## 2015-04-20 DIAGNOSIS — N186 End stage renal disease: Secondary | ICD-10-CM | POA: Diagnosis not present

## 2015-04-21 DIAGNOSIS — D509 Iron deficiency anemia, unspecified: Secondary | ICD-10-CM | POA: Diagnosis not present

## 2015-04-21 DIAGNOSIS — E1151 Type 2 diabetes mellitus with diabetic peripheral angiopathy without gangrene: Secondary | ICD-10-CM | POA: Diagnosis not present

## 2015-04-21 DIAGNOSIS — N186 End stage renal disease: Secondary | ICD-10-CM | POA: Diagnosis not present

## 2015-04-21 DIAGNOSIS — D508 Other iron deficiency anemias: Secondary | ICD-10-CM | POA: Diagnosis not present

## 2015-04-21 DIAGNOSIS — D631 Anemia in chronic kidney disease: Secondary | ICD-10-CM | POA: Diagnosis not present

## 2015-04-21 DIAGNOSIS — N2581 Secondary hyperparathyroidism of renal origin: Secondary | ICD-10-CM | POA: Diagnosis not present

## 2015-04-22 DIAGNOSIS — E1151 Type 2 diabetes mellitus with diabetic peripheral angiopathy without gangrene: Secondary | ICD-10-CM | POA: Diagnosis not present

## 2015-04-22 DIAGNOSIS — D508 Other iron deficiency anemias: Secondary | ICD-10-CM | POA: Diagnosis not present

## 2015-04-22 DIAGNOSIS — D509 Iron deficiency anemia, unspecified: Secondary | ICD-10-CM | POA: Diagnosis not present

## 2015-04-22 DIAGNOSIS — N186 End stage renal disease: Secondary | ICD-10-CM | POA: Diagnosis not present

## 2015-04-22 DIAGNOSIS — N2581 Secondary hyperparathyroidism of renal origin: Secondary | ICD-10-CM | POA: Diagnosis not present

## 2015-04-22 DIAGNOSIS — D631 Anemia in chronic kidney disease: Secondary | ICD-10-CM | POA: Diagnosis not present

## 2015-04-25 DIAGNOSIS — N2581 Secondary hyperparathyroidism of renal origin: Secondary | ICD-10-CM | POA: Diagnosis not present

## 2015-04-25 DIAGNOSIS — N186 End stage renal disease: Secondary | ICD-10-CM | POA: Diagnosis not present

## 2015-04-25 DIAGNOSIS — D508 Other iron deficiency anemias: Secondary | ICD-10-CM | POA: Diagnosis not present

## 2015-04-25 DIAGNOSIS — E1151 Type 2 diabetes mellitus with diabetic peripheral angiopathy without gangrene: Secondary | ICD-10-CM | POA: Diagnosis not present

## 2015-04-25 DIAGNOSIS — D631 Anemia in chronic kidney disease: Secondary | ICD-10-CM | POA: Diagnosis not present

## 2015-04-25 DIAGNOSIS — D509 Iron deficiency anemia, unspecified: Secondary | ICD-10-CM | POA: Diagnosis not present

## 2015-04-27 DIAGNOSIS — N2581 Secondary hyperparathyroidism of renal origin: Secondary | ICD-10-CM | POA: Diagnosis not present

## 2015-04-27 DIAGNOSIS — T8620 Unspecified complication of heart transplant: Secondary | ICD-10-CM | POA: Diagnosis not present

## 2015-04-27 DIAGNOSIS — Z992 Dependence on renal dialysis: Secondary | ICD-10-CM | POA: Diagnosis not present

## 2015-04-27 DIAGNOSIS — D508 Other iron deficiency anemias: Secondary | ICD-10-CM | POA: Diagnosis not present

## 2015-04-27 DIAGNOSIS — D509 Iron deficiency anemia, unspecified: Secondary | ICD-10-CM | POA: Diagnosis not present

## 2015-04-27 DIAGNOSIS — D631 Anemia in chronic kidney disease: Secondary | ICD-10-CM | POA: Diagnosis not present

## 2015-04-27 DIAGNOSIS — E1151 Type 2 diabetes mellitus with diabetic peripheral angiopathy without gangrene: Secondary | ICD-10-CM | POA: Diagnosis not present

## 2015-04-27 DIAGNOSIS — N186 End stage renal disease: Secondary | ICD-10-CM | POA: Diagnosis not present

## 2015-04-29 DIAGNOSIS — N186 End stage renal disease: Secondary | ICD-10-CM | POA: Diagnosis not present

## 2015-04-29 DIAGNOSIS — E8779 Other fluid overload: Secondary | ICD-10-CM | POA: Diagnosis not present

## 2015-04-29 DIAGNOSIS — D509 Iron deficiency anemia, unspecified: Secondary | ICD-10-CM | POA: Diagnosis not present

## 2015-04-29 DIAGNOSIS — E1151 Type 2 diabetes mellitus with diabetic peripheral angiopathy without gangrene: Secondary | ICD-10-CM | POA: Diagnosis not present

## 2015-04-29 DIAGNOSIS — D631 Anemia in chronic kidney disease: Secondary | ICD-10-CM | POA: Diagnosis not present

## 2015-04-29 DIAGNOSIS — N2581 Secondary hyperparathyroidism of renal origin: Secondary | ICD-10-CM | POA: Diagnosis not present

## 2015-05-02 DIAGNOSIS — D631 Anemia in chronic kidney disease: Secondary | ICD-10-CM | POA: Diagnosis not present

## 2015-05-02 DIAGNOSIS — E8779 Other fluid overload: Secondary | ICD-10-CM | POA: Diagnosis not present

## 2015-05-02 DIAGNOSIS — N2581 Secondary hyperparathyroidism of renal origin: Secondary | ICD-10-CM | POA: Diagnosis not present

## 2015-05-02 DIAGNOSIS — E1151 Type 2 diabetes mellitus with diabetic peripheral angiopathy without gangrene: Secondary | ICD-10-CM | POA: Diagnosis not present

## 2015-05-02 DIAGNOSIS — D509 Iron deficiency anemia, unspecified: Secondary | ICD-10-CM | POA: Diagnosis not present

## 2015-05-02 DIAGNOSIS — N186 End stage renal disease: Secondary | ICD-10-CM | POA: Diagnosis not present

## 2015-05-04 DIAGNOSIS — D509 Iron deficiency anemia, unspecified: Secondary | ICD-10-CM | POA: Diagnosis not present

## 2015-05-04 DIAGNOSIS — N2581 Secondary hyperparathyroidism of renal origin: Secondary | ICD-10-CM | POA: Diagnosis not present

## 2015-05-04 DIAGNOSIS — N186 End stage renal disease: Secondary | ICD-10-CM | POA: Diagnosis not present

## 2015-05-04 DIAGNOSIS — E8779 Other fluid overload: Secondary | ICD-10-CM | POA: Diagnosis not present

## 2015-05-04 DIAGNOSIS — D631 Anemia in chronic kidney disease: Secondary | ICD-10-CM | POA: Diagnosis not present

## 2015-05-04 DIAGNOSIS — E1151 Type 2 diabetes mellitus with diabetic peripheral angiopathy without gangrene: Secondary | ICD-10-CM | POA: Diagnosis not present

## 2015-05-05 DIAGNOSIS — E1151 Type 2 diabetes mellitus with diabetic peripheral angiopathy without gangrene: Secondary | ICD-10-CM | POA: Diagnosis not present

## 2015-05-05 DIAGNOSIS — D631 Anemia in chronic kidney disease: Secondary | ICD-10-CM | POA: Diagnosis not present

## 2015-05-05 DIAGNOSIS — N2581 Secondary hyperparathyroidism of renal origin: Secondary | ICD-10-CM | POA: Diagnosis not present

## 2015-05-05 DIAGNOSIS — N186 End stage renal disease: Secondary | ICD-10-CM | POA: Diagnosis not present

## 2015-05-05 DIAGNOSIS — D509 Iron deficiency anemia, unspecified: Secondary | ICD-10-CM | POA: Diagnosis not present

## 2015-05-05 DIAGNOSIS — E8779 Other fluid overload: Secondary | ICD-10-CM | POA: Diagnosis not present

## 2015-05-06 DIAGNOSIS — E1151 Type 2 diabetes mellitus with diabetic peripheral angiopathy without gangrene: Secondary | ICD-10-CM | POA: Diagnosis not present

## 2015-05-06 DIAGNOSIS — N186 End stage renal disease: Secondary | ICD-10-CM | POA: Diagnosis not present

## 2015-05-06 DIAGNOSIS — E8779 Other fluid overload: Secondary | ICD-10-CM | POA: Diagnosis not present

## 2015-05-06 DIAGNOSIS — D509 Iron deficiency anemia, unspecified: Secondary | ICD-10-CM | POA: Diagnosis not present

## 2015-05-06 DIAGNOSIS — N2581 Secondary hyperparathyroidism of renal origin: Secondary | ICD-10-CM | POA: Diagnosis not present

## 2015-05-06 DIAGNOSIS — D631 Anemia in chronic kidney disease: Secondary | ICD-10-CM | POA: Diagnosis not present

## 2015-05-09 DIAGNOSIS — E8779 Other fluid overload: Secondary | ICD-10-CM | POA: Diagnosis not present

## 2015-05-09 DIAGNOSIS — E1151 Type 2 diabetes mellitus with diabetic peripheral angiopathy without gangrene: Secondary | ICD-10-CM | POA: Diagnosis not present

## 2015-05-09 DIAGNOSIS — D631 Anemia in chronic kidney disease: Secondary | ICD-10-CM | POA: Diagnosis not present

## 2015-05-09 DIAGNOSIS — D509 Iron deficiency anemia, unspecified: Secondary | ICD-10-CM | POA: Diagnosis not present

## 2015-05-09 DIAGNOSIS — N186 End stage renal disease: Secondary | ICD-10-CM | POA: Diagnosis not present

## 2015-05-09 DIAGNOSIS — N2581 Secondary hyperparathyroidism of renal origin: Secondary | ICD-10-CM | POA: Diagnosis not present

## 2015-05-11 DIAGNOSIS — N2581 Secondary hyperparathyroidism of renal origin: Secondary | ICD-10-CM | POA: Diagnosis not present

## 2015-05-11 DIAGNOSIS — D631 Anemia in chronic kidney disease: Secondary | ICD-10-CM | POA: Diagnosis not present

## 2015-05-11 DIAGNOSIS — E8779 Other fluid overload: Secondary | ICD-10-CM | POA: Diagnosis not present

## 2015-05-11 DIAGNOSIS — N186 End stage renal disease: Secondary | ICD-10-CM | POA: Diagnosis not present

## 2015-05-11 DIAGNOSIS — E1151 Type 2 diabetes mellitus with diabetic peripheral angiopathy without gangrene: Secondary | ICD-10-CM | POA: Diagnosis not present

## 2015-05-11 DIAGNOSIS — D509 Iron deficiency anemia, unspecified: Secondary | ICD-10-CM | POA: Diagnosis not present

## 2015-05-13 DIAGNOSIS — D509 Iron deficiency anemia, unspecified: Secondary | ICD-10-CM | POA: Diagnosis not present

## 2015-05-13 DIAGNOSIS — N2581 Secondary hyperparathyroidism of renal origin: Secondary | ICD-10-CM | POA: Diagnosis not present

## 2015-05-13 DIAGNOSIS — E1151 Type 2 diabetes mellitus with diabetic peripheral angiopathy without gangrene: Secondary | ICD-10-CM | POA: Diagnosis not present

## 2015-05-13 DIAGNOSIS — D631 Anemia in chronic kidney disease: Secondary | ICD-10-CM | POA: Diagnosis not present

## 2015-05-13 DIAGNOSIS — E8779 Other fluid overload: Secondary | ICD-10-CM | POA: Diagnosis not present

## 2015-05-13 DIAGNOSIS — N186 End stage renal disease: Secondary | ICD-10-CM | POA: Diagnosis not present

## 2015-05-16 DIAGNOSIS — D509 Iron deficiency anemia, unspecified: Secondary | ICD-10-CM | POA: Diagnosis not present

## 2015-05-16 DIAGNOSIS — E1151 Type 2 diabetes mellitus with diabetic peripheral angiopathy without gangrene: Secondary | ICD-10-CM | POA: Diagnosis not present

## 2015-05-16 DIAGNOSIS — N186 End stage renal disease: Secondary | ICD-10-CM | POA: Diagnosis not present

## 2015-05-16 DIAGNOSIS — N2581 Secondary hyperparathyroidism of renal origin: Secondary | ICD-10-CM | POA: Diagnosis not present

## 2015-05-16 DIAGNOSIS — E8779 Other fluid overload: Secondary | ICD-10-CM | POA: Diagnosis not present

## 2015-05-16 DIAGNOSIS — D631 Anemia in chronic kidney disease: Secondary | ICD-10-CM | POA: Diagnosis not present

## 2015-05-18 DIAGNOSIS — E1151 Type 2 diabetes mellitus with diabetic peripheral angiopathy without gangrene: Secondary | ICD-10-CM | POA: Diagnosis not present

## 2015-05-18 DIAGNOSIS — D509 Iron deficiency anemia, unspecified: Secondary | ICD-10-CM | POA: Diagnosis not present

## 2015-05-18 DIAGNOSIS — E8779 Other fluid overload: Secondary | ICD-10-CM | POA: Diagnosis not present

## 2015-05-18 DIAGNOSIS — N2581 Secondary hyperparathyroidism of renal origin: Secondary | ICD-10-CM | POA: Diagnosis not present

## 2015-05-18 DIAGNOSIS — D631 Anemia in chronic kidney disease: Secondary | ICD-10-CM | POA: Diagnosis not present

## 2015-05-18 DIAGNOSIS — N186 End stage renal disease: Secondary | ICD-10-CM | POA: Diagnosis not present

## 2015-05-20 DIAGNOSIS — N2581 Secondary hyperparathyroidism of renal origin: Secondary | ICD-10-CM | POA: Diagnosis not present

## 2015-05-20 DIAGNOSIS — N186 End stage renal disease: Secondary | ICD-10-CM | POA: Diagnosis not present

## 2015-05-20 DIAGNOSIS — D509 Iron deficiency anemia, unspecified: Secondary | ICD-10-CM | POA: Diagnosis not present

## 2015-05-20 DIAGNOSIS — D631 Anemia in chronic kidney disease: Secondary | ICD-10-CM | POA: Diagnosis not present

## 2015-05-20 DIAGNOSIS — E8779 Other fluid overload: Secondary | ICD-10-CM | POA: Diagnosis not present

## 2015-05-20 DIAGNOSIS — E1151 Type 2 diabetes mellitus with diabetic peripheral angiopathy without gangrene: Secondary | ICD-10-CM | POA: Diagnosis not present

## 2015-05-23 DIAGNOSIS — N2581 Secondary hyperparathyroidism of renal origin: Secondary | ICD-10-CM | POA: Diagnosis not present

## 2015-05-23 DIAGNOSIS — D509 Iron deficiency anemia, unspecified: Secondary | ICD-10-CM | POA: Diagnosis not present

## 2015-05-23 DIAGNOSIS — N186 End stage renal disease: Secondary | ICD-10-CM | POA: Diagnosis not present

## 2015-05-23 DIAGNOSIS — E1151 Type 2 diabetes mellitus with diabetic peripheral angiopathy without gangrene: Secondary | ICD-10-CM | POA: Diagnosis not present

## 2015-05-23 DIAGNOSIS — E8779 Other fluid overload: Secondary | ICD-10-CM | POA: Diagnosis not present

## 2015-05-23 DIAGNOSIS — D631 Anemia in chronic kidney disease: Secondary | ICD-10-CM | POA: Diagnosis not present

## 2015-05-25 DIAGNOSIS — D631 Anemia in chronic kidney disease: Secondary | ICD-10-CM | POA: Diagnosis not present

## 2015-05-25 DIAGNOSIS — E1151 Type 2 diabetes mellitus with diabetic peripheral angiopathy without gangrene: Secondary | ICD-10-CM | POA: Diagnosis not present

## 2015-05-25 DIAGNOSIS — D509 Iron deficiency anemia, unspecified: Secondary | ICD-10-CM | POA: Diagnosis not present

## 2015-05-25 DIAGNOSIS — E8779 Other fluid overload: Secondary | ICD-10-CM | POA: Diagnosis not present

## 2015-05-25 DIAGNOSIS — N186 End stage renal disease: Secondary | ICD-10-CM | POA: Diagnosis not present

## 2015-05-25 DIAGNOSIS — N2581 Secondary hyperparathyroidism of renal origin: Secondary | ICD-10-CM | POA: Diagnosis not present

## 2015-05-27 DIAGNOSIS — T8620 Unspecified complication of heart transplant: Secondary | ICD-10-CM | POA: Diagnosis not present

## 2015-05-27 DIAGNOSIS — D631 Anemia in chronic kidney disease: Secondary | ICD-10-CM | POA: Diagnosis not present

## 2015-05-27 DIAGNOSIS — N186 End stage renal disease: Secondary | ICD-10-CM | POA: Diagnosis not present

## 2015-05-27 DIAGNOSIS — N2581 Secondary hyperparathyroidism of renal origin: Secondary | ICD-10-CM | POA: Diagnosis not present

## 2015-05-27 DIAGNOSIS — E1151 Type 2 diabetes mellitus with diabetic peripheral angiopathy without gangrene: Secondary | ICD-10-CM | POA: Diagnosis not present

## 2015-05-27 DIAGNOSIS — D509 Iron deficiency anemia, unspecified: Secondary | ICD-10-CM | POA: Diagnosis not present

## 2015-05-27 DIAGNOSIS — E8779 Other fluid overload: Secondary | ICD-10-CM | POA: Diagnosis not present

## 2015-05-27 DIAGNOSIS — Z992 Dependence on renal dialysis: Secondary | ICD-10-CM | POA: Diagnosis not present

## 2015-05-30 DIAGNOSIS — E8779 Other fluid overload: Secondary | ICD-10-CM | POA: Diagnosis not present

## 2015-05-30 DIAGNOSIS — D509 Iron deficiency anemia, unspecified: Secondary | ICD-10-CM | POA: Diagnosis not present

## 2015-05-30 DIAGNOSIS — Z23 Encounter for immunization: Secondary | ICD-10-CM | POA: Diagnosis not present

## 2015-05-30 DIAGNOSIS — D631 Anemia in chronic kidney disease: Secondary | ICD-10-CM | POA: Diagnosis not present

## 2015-05-30 DIAGNOSIS — E1151 Type 2 diabetes mellitus with diabetic peripheral angiopathy without gangrene: Secondary | ICD-10-CM | POA: Diagnosis not present

## 2015-05-30 DIAGNOSIS — N2581 Secondary hyperparathyroidism of renal origin: Secondary | ICD-10-CM | POA: Diagnosis not present

## 2015-05-30 DIAGNOSIS — N186 End stage renal disease: Secondary | ICD-10-CM | POA: Diagnosis not present

## 2015-06-01 DIAGNOSIS — Z23 Encounter for immunization: Secondary | ICD-10-CM | POA: Diagnosis not present

## 2015-06-01 DIAGNOSIS — D509 Iron deficiency anemia, unspecified: Secondary | ICD-10-CM | POA: Diagnosis not present

## 2015-06-01 DIAGNOSIS — E1151 Type 2 diabetes mellitus with diabetic peripheral angiopathy without gangrene: Secondary | ICD-10-CM | POA: Diagnosis not present

## 2015-06-01 DIAGNOSIS — D631 Anemia in chronic kidney disease: Secondary | ICD-10-CM | POA: Diagnosis not present

## 2015-06-01 DIAGNOSIS — N186 End stage renal disease: Secondary | ICD-10-CM | POA: Diagnosis not present

## 2015-06-01 DIAGNOSIS — N2581 Secondary hyperparathyroidism of renal origin: Secondary | ICD-10-CM | POA: Diagnosis not present

## 2015-06-03 DIAGNOSIS — D631 Anemia in chronic kidney disease: Secondary | ICD-10-CM | POA: Diagnosis not present

## 2015-06-03 DIAGNOSIS — D509 Iron deficiency anemia, unspecified: Secondary | ICD-10-CM | POA: Diagnosis not present

## 2015-06-03 DIAGNOSIS — N2581 Secondary hyperparathyroidism of renal origin: Secondary | ICD-10-CM | POA: Diagnosis not present

## 2015-06-03 DIAGNOSIS — E1151 Type 2 diabetes mellitus with diabetic peripheral angiopathy without gangrene: Secondary | ICD-10-CM | POA: Diagnosis not present

## 2015-06-03 DIAGNOSIS — Z23 Encounter for immunization: Secondary | ICD-10-CM | POA: Diagnosis not present

## 2015-06-03 DIAGNOSIS — N186 End stage renal disease: Secondary | ICD-10-CM | POA: Diagnosis not present

## 2015-06-06 DIAGNOSIS — D631 Anemia in chronic kidney disease: Secondary | ICD-10-CM | POA: Diagnosis not present

## 2015-06-06 DIAGNOSIS — N186 End stage renal disease: Secondary | ICD-10-CM | POA: Diagnosis not present

## 2015-06-06 DIAGNOSIS — N2581 Secondary hyperparathyroidism of renal origin: Secondary | ICD-10-CM | POA: Diagnosis not present

## 2015-06-06 DIAGNOSIS — E1151 Type 2 diabetes mellitus with diabetic peripheral angiopathy without gangrene: Secondary | ICD-10-CM | POA: Diagnosis not present

## 2015-06-06 DIAGNOSIS — Z23 Encounter for immunization: Secondary | ICD-10-CM | POA: Diagnosis not present

## 2015-06-06 DIAGNOSIS — D509 Iron deficiency anemia, unspecified: Secondary | ICD-10-CM | POA: Diagnosis not present

## 2015-06-08 DIAGNOSIS — N186 End stage renal disease: Secondary | ICD-10-CM | POA: Diagnosis not present

## 2015-06-08 DIAGNOSIS — Z23 Encounter for immunization: Secondary | ICD-10-CM | POA: Diagnosis not present

## 2015-06-08 DIAGNOSIS — D509 Iron deficiency anemia, unspecified: Secondary | ICD-10-CM | POA: Diagnosis not present

## 2015-06-08 DIAGNOSIS — D631 Anemia in chronic kidney disease: Secondary | ICD-10-CM | POA: Diagnosis not present

## 2015-06-08 DIAGNOSIS — N2581 Secondary hyperparathyroidism of renal origin: Secondary | ICD-10-CM | POA: Diagnosis not present

## 2015-06-08 DIAGNOSIS — E1151 Type 2 diabetes mellitus with diabetic peripheral angiopathy without gangrene: Secondary | ICD-10-CM | POA: Diagnosis not present

## 2015-06-10 DIAGNOSIS — N2581 Secondary hyperparathyroidism of renal origin: Secondary | ICD-10-CM | POA: Diagnosis not present

## 2015-06-10 DIAGNOSIS — E1151 Type 2 diabetes mellitus with diabetic peripheral angiopathy without gangrene: Secondary | ICD-10-CM | POA: Diagnosis not present

## 2015-06-10 DIAGNOSIS — D631 Anemia in chronic kidney disease: Secondary | ICD-10-CM | POA: Diagnosis not present

## 2015-06-10 DIAGNOSIS — Z23 Encounter for immunization: Secondary | ICD-10-CM | POA: Diagnosis not present

## 2015-06-10 DIAGNOSIS — N186 End stage renal disease: Secondary | ICD-10-CM | POA: Diagnosis not present

## 2015-06-10 DIAGNOSIS — D509 Iron deficiency anemia, unspecified: Secondary | ICD-10-CM | POA: Diagnosis not present

## 2015-06-13 DIAGNOSIS — N2581 Secondary hyperparathyroidism of renal origin: Secondary | ICD-10-CM | POA: Diagnosis not present

## 2015-06-13 DIAGNOSIS — D509 Iron deficiency anemia, unspecified: Secondary | ICD-10-CM | POA: Diagnosis not present

## 2015-06-13 DIAGNOSIS — E1151 Type 2 diabetes mellitus with diabetic peripheral angiopathy without gangrene: Secondary | ICD-10-CM | POA: Diagnosis not present

## 2015-06-13 DIAGNOSIS — D631 Anemia in chronic kidney disease: Secondary | ICD-10-CM | POA: Diagnosis not present

## 2015-06-13 DIAGNOSIS — N186 End stage renal disease: Secondary | ICD-10-CM | POA: Diagnosis not present

## 2015-06-13 DIAGNOSIS — Z23 Encounter for immunization: Secondary | ICD-10-CM | POA: Diagnosis not present

## 2015-06-15 DIAGNOSIS — N186 End stage renal disease: Secondary | ICD-10-CM | POA: Diagnosis not present

## 2015-06-15 DIAGNOSIS — D631 Anemia in chronic kidney disease: Secondary | ICD-10-CM | POA: Diagnosis not present

## 2015-06-15 DIAGNOSIS — D509 Iron deficiency anemia, unspecified: Secondary | ICD-10-CM | POA: Diagnosis not present

## 2015-06-15 DIAGNOSIS — Z23 Encounter for immunization: Secondary | ICD-10-CM | POA: Diagnosis not present

## 2015-06-15 DIAGNOSIS — E1151 Type 2 diabetes mellitus with diabetic peripheral angiopathy without gangrene: Secondary | ICD-10-CM | POA: Diagnosis not present

## 2015-06-15 DIAGNOSIS — N2581 Secondary hyperparathyroidism of renal origin: Secondary | ICD-10-CM | POA: Diagnosis not present

## 2015-06-17 DIAGNOSIS — D509 Iron deficiency anemia, unspecified: Secondary | ICD-10-CM | POA: Diagnosis not present

## 2015-06-17 DIAGNOSIS — N2581 Secondary hyperparathyroidism of renal origin: Secondary | ICD-10-CM | POA: Diagnosis not present

## 2015-06-17 DIAGNOSIS — E1151 Type 2 diabetes mellitus with diabetic peripheral angiopathy without gangrene: Secondary | ICD-10-CM | POA: Diagnosis not present

## 2015-06-17 DIAGNOSIS — Z23 Encounter for immunization: Secondary | ICD-10-CM | POA: Diagnosis not present

## 2015-06-17 DIAGNOSIS — N186 End stage renal disease: Secondary | ICD-10-CM | POA: Diagnosis not present

## 2015-06-17 DIAGNOSIS — D631 Anemia in chronic kidney disease: Secondary | ICD-10-CM | POA: Diagnosis not present

## 2015-06-20 DIAGNOSIS — N186 End stage renal disease: Secondary | ICD-10-CM | POA: Diagnosis not present

## 2015-06-20 DIAGNOSIS — D509 Iron deficiency anemia, unspecified: Secondary | ICD-10-CM | POA: Diagnosis not present

## 2015-06-20 DIAGNOSIS — Z23 Encounter for immunization: Secondary | ICD-10-CM | POA: Diagnosis not present

## 2015-06-20 DIAGNOSIS — N2581 Secondary hyperparathyroidism of renal origin: Secondary | ICD-10-CM | POA: Diagnosis not present

## 2015-06-20 DIAGNOSIS — D631 Anemia in chronic kidney disease: Secondary | ICD-10-CM | POA: Diagnosis not present

## 2015-06-20 DIAGNOSIS — E1151 Type 2 diabetes mellitus with diabetic peripheral angiopathy without gangrene: Secondary | ICD-10-CM | POA: Diagnosis not present

## 2015-06-21 DIAGNOSIS — D509 Iron deficiency anemia, unspecified: Secondary | ICD-10-CM | POA: Diagnosis not present

## 2015-06-21 DIAGNOSIS — Z23 Encounter for immunization: Secondary | ICD-10-CM | POA: Diagnosis not present

## 2015-06-21 DIAGNOSIS — E1151 Type 2 diabetes mellitus with diabetic peripheral angiopathy without gangrene: Secondary | ICD-10-CM | POA: Diagnosis not present

## 2015-06-21 DIAGNOSIS — D631 Anemia in chronic kidney disease: Secondary | ICD-10-CM | POA: Diagnosis not present

## 2015-06-21 DIAGNOSIS — N2581 Secondary hyperparathyroidism of renal origin: Secondary | ICD-10-CM | POA: Diagnosis not present

## 2015-06-21 DIAGNOSIS — N186 End stage renal disease: Secondary | ICD-10-CM | POA: Diagnosis not present

## 2015-06-22 DIAGNOSIS — Z23 Encounter for immunization: Secondary | ICD-10-CM | POA: Diagnosis not present

## 2015-06-22 DIAGNOSIS — D509 Iron deficiency anemia, unspecified: Secondary | ICD-10-CM | POA: Diagnosis not present

## 2015-06-22 DIAGNOSIS — N186 End stage renal disease: Secondary | ICD-10-CM | POA: Diagnosis not present

## 2015-06-22 DIAGNOSIS — E1151 Type 2 diabetes mellitus with diabetic peripheral angiopathy without gangrene: Secondary | ICD-10-CM | POA: Diagnosis not present

## 2015-06-22 DIAGNOSIS — N2581 Secondary hyperparathyroidism of renal origin: Secondary | ICD-10-CM | POA: Diagnosis not present

## 2015-06-22 DIAGNOSIS — D631 Anemia in chronic kidney disease: Secondary | ICD-10-CM | POA: Diagnosis not present

## 2015-06-24 DIAGNOSIS — D631 Anemia in chronic kidney disease: Secondary | ICD-10-CM | POA: Diagnosis not present

## 2015-06-24 DIAGNOSIS — Z23 Encounter for immunization: Secondary | ICD-10-CM | POA: Diagnosis not present

## 2015-06-24 DIAGNOSIS — D509 Iron deficiency anemia, unspecified: Secondary | ICD-10-CM | POA: Diagnosis not present

## 2015-06-24 DIAGNOSIS — N2581 Secondary hyperparathyroidism of renal origin: Secondary | ICD-10-CM | POA: Diagnosis not present

## 2015-06-24 DIAGNOSIS — N186 End stage renal disease: Secondary | ICD-10-CM | POA: Diagnosis not present

## 2015-06-24 DIAGNOSIS — E1151 Type 2 diabetes mellitus with diabetic peripheral angiopathy without gangrene: Secondary | ICD-10-CM | POA: Diagnosis not present

## 2015-06-27 DIAGNOSIS — Z992 Dependence on renal dialysis: Secondary | ICD-10-CM | POA: Diagnosis not present

## 2015-06-27 DIAGNOSIS — D631 Anemia in chronic kidney disease: Secondary | ICD-10-CM | POA: Diagnosis not present

## 2015-06-27 DIAGNOSIS — T8620 Unspecified complication of heart transplant: Secondary | ICD-10-CM | POA: Diagnosis not present

## 2015-06-27 DIAGNOSIS — N186 End stage renal disease: Secondary | ICD-10-CM | POA: Diagnosis not present

## 2015-06-27 DIAGNOSIS — D509 Iron deficiency anemia, unspecified: Secondary | ICD-10-CM | POA: Diagnosis not present

## 2015-06-27 DIAGNOSIS — E1151 Type 2 diabetes mellitus with diabetic peripheral angiopathy without gangrene: Secondary | ICD-10-CM | POA: Diagnosis not present

## 2015-06-27 DIAGNOSIS — N2581 Secondary hyperparathyroidism of renal origin: Secondary | ICD-10-CM | POA: Diagnosis not present

## 2015-06-27 DIAGNOSIS — Z23 Encounter for immunization: Secondary | ICD-10-CM | POA: Diagnosis not present

## 2015-06-29 DIAGNOSIS — N186 End stage renal disease: Secondary | ICD-10-CM | POA: Diagnosis not present

## 2015-06-29 DIAGNOSIS — D631 Anemia in chronic kidney disease: Secondary | ICD-10-CM | POA: Diagnosis not present

## 2015-06-29 DIAGNOSIS — E1151 Type 2 diabetes mellitus with diabetic peripheral angiopathy without gangrene: Secondary | ICD-10-CM | POA: Diagnosis not present

## 2015-06-29 DIAGNOSIS — N2581 Secondary hyperparathyroidism of renal origin: Secondary | ICD-10-CM | POA: Diagnosis not present

## 2015-07-01 DIAGNOSIS — E1151 Type 2 diabetes mellitus with diabetic peripheral angiopathy without gangrene: Secondary | ICD-10-CM | POA: Diagnosis not present

## 2015-07-01 DIAGNOSIS — D631 Anemia in chronic kidney disease: Secondary | ICD-10-CM | POA: Diagnosis not present

## 2015-07-01 DIAGNOSIS — N2581 Secondary hyperparathyroidism of renal origin: Secondary | ICD-10-CM | POA: Diagnosis not present

## 2015-07-01 DIAGNOSIS — N186 End stage renal disease: Secondary | ICD-10-CM | POA: Diagnosis not present

## 2015-07-04 DIAGNOSIS — N2581 Secondary hyperparathyroidism of renal origin: Secondary | ICD-10-CM | POA: Diagnosis not present

## 2015-07-04 DIAGNOSIS — E1151 Type 2 diabetes mellitus with diabetic peripheral angiopathy without gangrene: Secondary | ICD-10-CM | POA: Diagnosis not present

## 2015-07-04 DIAGNOSIS — N186 End stage renal disease: Secondary | ICD-10-CM | POA: Diagnosis not present

## 2015-07-04 DIAGNOSIS — D631 Anemia in chronic kidney disease: Secondary | ICD-10-CM | POA: Diagnosis not present

## 2015-07-06 DIAGNOSIS — N186 End stage renal disease: Secondary | ICD-10-CM | POA: Diagnosis not present

## 2015-07-06 DIAGNOSIS — E1151 Type 2 diabetes mellitus with diabetic peripheral angiopathy without gangrene: Secondary | ICD-10-CM | POA: Diagnosis not present

## 2015-07-06 DIAGNOSIS — N2581 Secondary hyperparathyroidism of renal origin: Secondary | ICD-10-CM | POA: Diagnosis not present

## 2015-07-06 DIAGNOSIS — D631 Anemia in chronic kidney disease: Secondary | ICD-10-CM | POA: Diagnosis not present

## 2015-07-08 DIAGNOSIS — D631 Anemia in chronic kidney disease: Secondary | ICD-10-CM | POA: Diagnosis not present

## 2015-07-08 DIAGNOSIS — N186 End stage renal disease: Secondary | ICD-10-CM | POA: Diagnosis not present

## 2015-07-08 DIAGNOSIS — N2581 Secondary hyperparathyroidism of renal origin: Secondary | ICD-10-CM | POA: Diagnosis not present

## 2015-07-08 DIAGNOSIS — E1151 Type 2 diabetes mellitus with diabetic peripheral angiopathy without gangrene: Secondary | ICD-10-CM | POA: Diagnosis not present

## 2015-07-11 DIAGNOSIS — N186 End stage renal disease: Secondary | ICD-10-CM | POA: Diagnosis not present

## 2015-07-11 DIAGNOSIS — N2581 Secondary hyperparathyroidism of renal origin: Secondary | ICD-10-CM | POA: Diagnosis not present

## 2015-07-11 DIAGNOSIS — E1151 Type 2 diabetes mellitus with diabetic peripheral angiopathy without gangrene: Secondary | ICD-10-CM | POA: Diagnosis not present

## 2015-07-11 DIAGNOSIS — D631 Anemia in chronic kidney disease: Secondary | ICD-10-CM | POA: Diagnosis not present

## 2015-07-13 DIAGNOSIS — E1151 Type 2 diabetes mellitus with diabetic peripheral angiopathy without gangrene: Secondary | ICD-10-CM | POA: Diagnosis not present

## 2015-07-13 DIAGNOSIS — N2581 Secondary hyperparathyroidism of renal origin: Secondary | ICD-10-CM | POA: Diagnosis not present

## 2015-07-13 DIAGNOSIS — D631 Anemia in chronic kidney disease: Secondary | ICD-10-CM | POA: Diagnosis not present

## 2015-07-13 DIAGNOSIS — N186 End stage renal disease: Secondary | ICD-10-CM | POA: Diagnosis not present

## 2015-07-15 DIAGNOSIS — E1151 Type 2 diabetes mellitus with diabetic peripheral angiopathy without gangrene: Secondary | ICD-10-CM | POA: Diagnosis not present

## 2015-07-15 DIAGNOSIS — D631 Anemia in chronic kidney disease: Secondary | ICD-10-CM | POA: Diagnosis not present

## 2015-07-15 DIAGNOSIS — N186 End stage renal disease: Secondary | ICD-10-CM | POA: Diagnosis not present

## 2015-07-15 DIAGNOSIS — N2581 Secondary hyperparathyroidism of renal origin: Secondary | ICD-10-CM | POA: Diagnosis not present

## 2015-07-18 DIAGNOSIS — D631 Anemia in chronic kidney disease: Secondary | ICD-10-CM | POA: Diagnosis not present

## 2015-07-18 DIAGNOSIS — N2581 Secondary hyperparathyroidism of renal origin: Secondary | ICD-10-CM | POA: Diagnosis not present

## 2015-07-18 DIAGNOSIS — E1151 Type 2 diabetes mellitus with diabetic peripheral angiopathy without gangrene: Secondary | ICD-10-CM | POA: Diagnosis not present

## 2015-07-18 DIAGNOSIS — N186 End stage renal disease: Secondary | ICD-10-CM | POA: Diagnosis not present

## 2015-07-20 DIAGNOSIS — N2581 Secondary hyperparathyroidism of renal origin: Secondary | ICD-10-CM | POA: Diagnosis not present

## 2015-07-20 DIAGNOSIS — E1151 Type 2 diabetes mellitus with diabetic peripheral angiopathy without gangrene: Secondary | ICD-10-CM | POA: Diagnosis not present

## 2015-07-20 DIAGNOSIS — N186 End stage renal disease: Secondary | ICD-10-CM | POA: Diagnosis not present

## 2015-07-20 DIAGNOSIS — D631 Anemia in chronic kidney disease: Secondary | ICD-10-CM | POA: Diagnosis not present

## 2015-07-23 DIAGNOSIS — E1151 Type 2 diabetes mellitus with diabetic peripheral angiopathy without gangrene: Secondary | ICD-10-CM | POA: Diagnosis not present

## 2015-07-23 DIAGNOSIS — N186 End stage renal disease: Secondary | ICD-10-CM | POA: Diagnosis not present

## 2015-07-23 DIAGNOSIS — D631 Anemia in chronic kidney disease: Secondary | ICD-10-CM | POA: Diagnosis not present

## 2015-07-23 DIAGNOSIS — N2581 Secondary hyperparathyroidism of renal origin: Secondary | ICD-10-CM | POA: Diagnosis not present

## 2015-07-25 DIAGNOSIS — N186 End stage renal disease: Secondary | ICD-10-CM | POA: Diagnosis not present

## 2015-07-25 DIAGNOSIS — N2581 Secondary hyperparathyroidism of renal origin: Secondary | ICD-10-CM | POA: Diagnosis not present

## 2015-07-25 DIAGNOSIS — E1151 Type 2 diabetes mellitus with diabetic peripheral angiopathy without gangrene: Secondary | ICD-10-CM | POA: Diagnosis not present

## 2015-07-25 DIAGNOSIS — D631 Anemia in chronic kidney disease: Secondary | ICD-10-CM | POA: Diagnosis not present

## 2015-07-27 DIAGNOSIS — T8620 Unspecified complication of heart transplant: Secondary | ICD-10-CM | POA: Diagnosis not present

## 2015-07-27 DIAGNOSIS — N2581 Secondary hyperparathyroidism of renal origin: Secondary | ICD-10-CM | POA: Diagnosis not present

## 2015-07-27 DIAGNOSIS — Z992 Dependence on renal dialysis: Secondary | ICD-10-CM | POA: Diagnosis not present

## 2015-07-27 DIAGNOSIS — N186 End stage renal disease: Secondary | ICD-10-CM | POA: Diagnosis not present

## 2015-07-27 DIAGNOSIS — E1151 Type 2 diabetes mellitus with diabetic peripheral angiopathy without gangrene: Secondary | ICD-10-CM | POA: Diagnosis not present

## 2015-07-27 DIAGNOSIS — D631 Anemia in chronic kidney disease: Secondary | ICD-10-CM | POA: Diagnosis not present

## 2015-07-29 DIAGNOSIS — E1151 Type 2 diabetes mellitus with diabetic peripheral angiopathy without gangrene: Secondary | ICD-10-CM | POA: Diagnosis not present

## 2015-07-29 DIAGNOSIS — D631 Anemia in chronic kidney disease: Secondary | ICD-10-CM | POA: Diagnosis not present

## 2015-07-29 DIAGNOSIS — E8770 Fluid overload, unspecified: Secondary | ICD-10-CM | POA: Diagnosis not present

## 2015-07-29 DIAGNOSIS — N2581 Secondary hyperparathyroidism of renal origin: Secondary | ICD-10-CM | POA: Diagnosis not present

## 2015-07-29 DIAGNOSIS — N186 End stage renal disease: Secondary | ICD-10-CM | POA: Diagnosis not present

## 2015-08-01 DIAGNOSIS — D631 Anemia in chronic kidney disease: Secondary | ICD-10-CM | POA: Diagnosis not present

## 2015-08-01 DIAGNOSIS — N2581 Secondary hyperparathyroidism of renal origin: Secondary | ICD-10-CM | POA: Diagnosis not present

## 2015-08-01 DIAGNOSIS — E1151 Type 2 diabetes mellitus with diabetic peripheral angiopathy without gangrene: Secondary | ICD-10-CM | POA: Diagnosis not present

## 2015-08-01 DIAGNOSIS — N186 End stage renal disease: Secondary | ICD-10-CM | POA: Diagnosis not present

## 2015-08-01 DIAGNOSIS — E8770 Fluid overload, unspecified: Secondary | ICD-10-CM | POA: Diagnosis not present

## 2015-08-03 DIAGNOSIS — E8770 Fluid overload, unspecified: Secondary | ICD-10-CM | POA: Diagnosis not present

## 2015-08-03 DIAGNOSIS — D631 Anemia in chronic kidney disease: Secondary | ICD-10-CM | POA: Diagnosis not present

## 2015-08-03 DIAGNOSIS — N186 End stage renal disease: Secondary | ICD-10-CM | POA: Diagnosis not present

## 2015-08-03 DIAGNOSIS — N2581 Secondary hyperparathyroidism of renal origin: Secondary | ICD-10-CM | POA: Diagnosis not present

## 2015-08-03 DIAGNOSIS — E1151 Type 2 diabetes mellitus with diabetic peripheral angiopathy without gangrene: Secondary | ICD-10-CM | POA: Diagnosis not present

## 2015-08-05 DIAGNOSIS — N186 End stage renal disease: Secondary | ICD-10-CM | POA: Diagnosis not present

## 2015-08-05 DIAGNOSIS — N2581 Secondary hyperparathyroidism of renal origin: Secondary | ICD-10-CM | POA: Diagnosis not present

## 2015-08-05 DIAGNOSIS — E1151 Type 2 diabetes mellitus with diabetic peripheral angiopathy without gangrene: Secondary | ICD-10-CM | POA: Diagnosis not present

## 2015-08-05 DIAGNOSIS — E8770 Fluid overload, unspecified: Secondary | ICD-10-CM | POA: Diagnosis not present

## 2015-08-05 DIAGNOSIS — D631 Anemia in chronic kidney disease: Secondary | ICD-10-CM | POA: Diagnosis not present

## 2015-08-08 DIAGNOSIS — N186 End stage renal disease: Secondary | ICD-10-CM | POA: Diagnosis not present

## 2015-08-08 DIAGNOSIS — E1151 Type 2 diabetes mellitus with diabetic peripheral angiopathy without gangrene: Secondary | ICD-10-CM | POA: Diagnosis not present

## 2015-08-08 DIAGNOSIS — N2581 Secondary hyperparathyroidism of renal origin: Secondary | ICD-10-CM | POA: Diagnosis not present

## 2015-08-08 DIAGNOSIS — D631 Anemia in chronic kidney disease: Secondary | ICD-10-CM | POA: Diagnosis not present

## 2015-08-08 DIAGNOSIS — E8770 Fluid overload, unspecified: Secondary | ICD-10-CM | POA: Diagnosis not present

## 2015-08-10 DIAGNOSIS — N186 End stage renal disease: Secondary | ICD-10-CM | POA: Diagnosis not present

## 2015-08-10 DIAGNOSIS — D631 Anemia in chronic kidney disease: Secondary | ICD-10-CM | POA: Diagnosis not present

## 2015-08-10 DIAGNOSIS — N2581 Secondary hyperparathyroidism of renal origin: Secondary | ICD-10-CM | POA: Diagnosis not present

## 2015-08-10 DIAGNOSIS — E8770 Fluid overload, unspecified: Secondary | ICD-10-CM | POA: Diagnosis not present

## 2015-08-10 DIAGNOSIS — E1151 Type 2 diabetes mellitus with diabetic peripheral angiopathy without gangrene: Secondary | ICD-10-CM | POA: Diagnosis not present

## 2015-08-12 DIAGNOSIS — N186 End stage renal disease: Secondary | ICD-10-CM | POA: Diagnosis not present

## 2015-08-12 DIAGNOSIS — E8770 Fluid overload, unspecified: Secondary | ICD-10-CM | POA: Diagnosis not present

## 2015-08-12 DIAGNOSIS — N2581 Secondary hyperparathyroidism of renal origin: Secondary | ICD-10-CM | POA: Diagnosis not present

## 2015-08-12 DIAGNOSIS — D631 Anemia in chronic kidney disease: Secondary | ICD-10-CM | POA: Diagnosis not present

## 2015-08-12 DIAGNOSIS — E1151 Type 2 diabetes mellitus with diabetic peripheral angiopathy without gangrene: Secondary | ICD-10-CM | POA: Diagnosis not present

## 2015-08-15 DIAGNOSIS — N186 End stage renal disease: Secondary | ICD-10-CM | POA: Diagnosis not present

## 2015-08-15 DIAGNOSIS — E8770 Fluid overload, unspecified: Secondary | ICD-10-CM | POA: Diagnosis not present

## 2015-08-15 DIAGNOSIS — D631 Anemia in chronic kidney disease: Secondary | ICD-10-CM | POA: Diagnosis not present

## 2015-08-15 DIAGNOSIS — E1151 Type 2 diabetes mellitus with diabetic peripheral angiopathy without gangrene: Secondary | ICD-10-CM | POA: Diagnosis not present

## 2015-08-15 DIAGNOSIS — N2581 Secondary hyperparathyroidism of renal origin: Secondary | ICD-10-CM | POA: Diagnosis not present

## 2015-08-17 DIAGNOSIS — E1151 Type 2 diabetes mellitus with diabetic peripheral angiopathy without gangrene: Secondary | ICD-10-CM | POA: Diagnosis not present

## 2015-08-17 DIAGNOSIS — E8770 Fluid overload, unspecified: Secondary | ICD-10-CM | POA: Diagnosis not present

## 2015-08-17 DIAGNOSIS — N186 End stage renal disease: Secondary | ICD-10-CM | POA: Diagnosis not present

## 2015-08-17 DIAGNOSIS — N2581 Secondary hyperparathyroidism of renal origin: Secondary | ICD-10-CM | POA: Diagnosis not present

## 2015-08-17 DIAGNOSIS — D631 Anemia in chronic kidney disease: Secondary | ICD-10-CM | POA: Diagnosis not present

## 2015-08-19 DIAGNOSIS — N186 End stage renal disease: Secondary | ICD-10-CM | POA: Diagnosis not present

## 2015-08-19 DIAGNOSIS — N2581 Secondary hyperparathyroidism of renal origin: Secondary | ICD-10-CM | POA: Diagnosis not present

## 2015-08-19 DIAGNOSIS — E1151 Type 2 diabetes mellitus with diabetic peripheral angiopathy without gangrene: Secondary | ICD-10-CM | POA: Diagnosis not present

## 2015-08-19 DIAGNOSIS — D631 Anemia in chronic kidney disease: Secondary | ICD-10-CM | POA: Diagnosis not present

## 2015-08-19 DIAGNOSIS — E8770 Fluid overload, unspecified: Secondary | ICD-10-CM | POA: Diagnosis not present

## 2015-08-22 DIAGNOSIS — D631 Anemia in chronic kidney disease: Secondary | ICD-10-CM | POA: Diagnosis not present

## 2015-08-22 DIAGNOSIS — E1151 Type 2 diabetes mellitus with diabetic peripheral angiopathy without gangrene: Secondary | ICD-10-CM | POA: Diagnosis not present

## 2015-08-22 DIAGNOSIS — N2581 Secondary hyperparathyroidism of renal origin: Secondary | ICD-10-CM | POA: Diagnosis not present

## 2015-08-22 DIAGNOSIS — E8770 Fluid overload, unspecified: Secondary | ICD-10-CM | POA: Diagnosis not present

## 2015-08-22 DIAGNOSIS — N186 End stage renal disease: Secondary | ICD-10-CM | POA: Diagnosis not present

## 2015-08-24 DIAGNOSIS — E1151 Type 2 diabetes mellitus with diabetic peripheral angiopathy without gangrene: Secondary | ICD-10-CM | POA: Diagnosis not present

## 2015-08-24 DIAGNOSIS — E8770 Fluid overload, unspecified: Secondary | ICD-10-CM | POA: Diagnosis not present

## 2015-08-24 DIAGNOSIS — N186 End stage renal disease: Secondary | ICD-10-CM | POA: Diagnosis not present

## 2015-08-24 DIAGNOSIS — D631 Anemia in chronic kidney disease: Secondary | ICD-10-CM | POA: Diagnosis not present

## 2015-08-24 DIAGNOSIS — N2581 Secondary hyperparathyroidism of renal origin: Secondary | ICD-10-CM | POA: Diagnosis not present

## 2015-08-26 DIAGNOSIS — E8770 Fluid overload, unspecified: Secondary | ICD-10-CM | POA: Diagnosis not present

## 2015-08-26 DIAGNOSIS — E1151 Type 2 diabetes mellitus with diabetic peripheral angiopathy without gangrene: Secondary | ICD-10-CM | POA: Diagnosis not present

## 2015-08-26 DIAGNOSIS — N2581 Secondary hyperparathyroidism of renal origin: Secondary | ICD-10-CM | POA: Diagnosis not present

## 2015-08-26 DIAGNOSIS — N186 End stage renal disease: Secondary | ICD-10-CM | POA: Diagnosis not present

## 2015-08-26 DIAGNOSIS — D631 Anemia in chronic kidney disease: Secondary | ICD-10-CM | POA: Diagnosis not present

## 2015-08-27 DIAGNOSIS — N186 End stage renal disease: Secondary | ICD-10-CM | POA: Diagnosis not present

## 2015-08-27 DIAGNOSIS — D631 Anemia in chronic kidney disease: Secondary | ICD-10-CM | POA: Diagnosis not present

## 2015-08-27 DIAGNOSIS — N2581 Secondary hyperparathyroidism of renal origin: Secondary | ICD-10-CM | POA: Diagnosis not present

## 2015-08-27 DIAGNOSIS — T8620 Unspecified complication of heart transplant: Secondary | ICD-10-CM | POA: Diagnosis not present

## 2015-08-27 DIAGNOSIS — Z992 Dependence on renal dialysis: Secondary | ICD-10-CM | POA: Diagnosis not present

## 2015-08-27 DIAGNOSIS — E8770 Fluid overload, unspecified: Secondary | ICD-10-CM | POA: Diagnosis not present

## 2015-08-27 DIAGNOSIS — E1151 Type 2 diabetes mellitus with diabetic peripheral angiopathy without gangrene: Secondary | ICD-10-CM | POA: Diagnosis not present

## 2015-08-29 DIAGNOSIS — E8770 Fluid overload, unspecified: Secondary | ICD-10-CM | POA: Diagnosis not present

## 2015-08-29 DIAGNOSIS — N186 End stage renal disease: Secondary | ICD-10-CM | POA: Diagnosis not present

## 2015-08-29 DIAGNOSIS — E1151 Type 2 diabetes mellitus with diabetic peripheral angiopathy without gangrene: Secondary | ICD-10-CM | POA: Diagnosis not present

## 2015-08-29 DIAGNOSIS — N2581 Secondary hyperparathyroidism of renal origin: Secondary | ICD-10-CM | POA: Diagnosis not present

## 2015-08-31 DIAGNOSIS — E8770 Fluid overload, unspecified: Secondary | ICD-10-CM | POA: Diagnosis not present

## 2015-08-31 DIAGNOSIS — N186 End stage renal disease: Secondary | ICD-10-CM | POA: Diagnosis not present

## 2015-08-31 DIAGNOSIS — E1151 Type 2 diabetes mellitus with diabetic peripheral angiopathy without gangrene: Secondary | ICD-10-CM | POA: Diagnosis not present

## 2015-08-31 DIAGNOSIS — N2581 Secondary hyperparathyroidism of renal origin: Secondary | ICD-10-CM | POA: Diagnosis not present

## 2015-09-02 DIAGNOSIS — N2581 Secondary hyperparathyroidism of renal origin: Secondary | ICD-10-CM | POA: Diagnosis not present

## 2015-09-02 DIAGNOSIS — N186 End stage renal disease: Secondary | ICD-10-CM | POA: Diagnosis not present

## 2015-09-02 DIAGNOSIS — E8770 Fluid overload, unspecified: Secondary | ICD-10-CM | POA: Diagnosis not present

## 2015-09-02 DIAGNOSIS — E1151 Type 2 diabetes mellitus with diabetic peripheral angiopathy without gangrene: Secondary | ICD-10-CM | POA: Diagnosis not present

## 2015-09-05 DIAGNOSIS — N2581 Secondary hyperparathyroidism of renal origin: Secondary | ICD-10-CM | POA: Diagnosis not present

## 2015-09-05 DIAGNOSIS — E1151 Type 2 diabetes mellitus with diabetic peripheral angiopathy without gangrene: Secondary | ICD-10-CM | POA: Diagnosis not present

## 2015-09-05 DIAGNOSIS — N186 End stage renal disease: Secondary | ICD-10-CM | POA: Diagnosis not present

## 2015-09-05 DIAGNOSIS — E8770 Fluid overload, unspecified: Secondary | ICD-10-CM | POA: Diagnosis not present

## 2015-09-07 DIAGNOSIS — E1151 Type 2 diabetes mellitus with diabetic peripheral angiopathy without gangrene: Secondary | ICD-10-CM | POA: Diagnosis not present

## 2015-09-07 DIAGNOSIS — N186 End stage renal disease: Secondary | ICD-10-CM | POA: Diagnosis not present

## 2015-09-07 DIAGNOSIS — N2581 Secondary hyperparathyroidism of renal origin: Secondary | ICD-10-CM | POA: Diagnosis not present

## 2015-09-07 DIAGNOSIS — E8770 Fluid overload, unspecified: Secondary | ICD-10-CM | POA: Diagnosis not present

## 2015-09-09 DIAGNOSIS — E1151 Type 2 diabetes mellitus with diabetic peripheral angiopathy without gangrene: Secondary | ICD-10-CM | POA: Diagnosis not present

## 2015-09-09 DIAGNOSIS — N2581 Secondary hyperparathyroidism of renal origin: Secondary | ICD-10-CM | POA: Diagnosis not present

## 2015-09-09 DIAGNOSIS — N186 End stage renal disease: Secondary | ICD-10-CM | POA: Diagnosis not present

## 2015-09-09 DIAGNOSIS — E8770 Fluid overload, unspecified: Secondary | ICD-10-CM | POA: Diagnosis not present

## 2015-09-12 DIAGNOSIS — N186 End stage renal disease: Secondary | ICD-10-CM | POA: Diagnosis not present

## 2015-09-12 DIAGNOSIS — E8770 Fluid overload, unspecified: Secondary | ICD-10-CM | POA: Diagnosis not present

## 2015-09-12 DIAGNOSIS — E1151 Type 2 diabetes mellitus with diabetic peripheral angiopathy without gangrene: Secondary | ICD-10-CM | POA: Diagnosis not present

## 2015-09-12 DIAGNOSIS — N2581 Secondary hyperparathyroidism of renal origin: Secondary | ICD-10-CM | POA: Diagnosis not present

## 2015-09-14 DIAGNOSIS — E1151 Type 2 diabetes mellitus with diabetic peripheral angiopathy without gangrene: Secondary | ICD-10-CM | POA: Diagnosis not present

## 2015-09-14 DIAGNOSIS — N186 End stage renal disease: Secondary | ICD-10-CM | POA: Diagnosis not present

## 2015-09-14 DIAGNOSIS — N2581 Secondary hyperparathyroidism of renal origin: Secondary | ICD-10-CM | POA: Diagnosis not present

## 2015-09-14 DIAGNOSIS — E8770 Fluid overload, unspecified: Secondary | ICD-10-CM | POA: Diagnosis not present

## 2015-09-15 DIAGNOSIS — N186 End stage renal disease: Secondary | ICD-10-CM | POA: Diagnosis not present

## 2015-09-15 DIAGNOSIS — E8770 Fluid overload, unspecified: Secondary | ICD-10-CM | POA: Diagnosis not present

## 2015-09-15 DIAGNOSIS — E1151 Type 2 diabetes mellitus with diabetic peripheral angiopathy without gangrene: Secondary | ICD-10-CM | POA: Diagnosis not present

## 2015-09-15 DIAGNOSIS — N2581 Secondary hyperparathyroidism of renal origin: Secondary | ICD-10-CM | POA: Diagnosis not present

## 2015-09-16 DIAGNOSIS — E8770 Fluid overload, unspecified: Secondary | ICD-10-CM | POA: Diagnosis not present

## 2015-09-16 DIAGNOSIS — N186 End stage renal disease: Secondary | ICD-10-CM | POA: Diagnosis not present

## 2015-09-16 DIAGNOSIS — E1151 Type 2 diabetes mellitus with diabetic peripheral angiopathy without gangrene: Secondary | ICD-10-CM | POA: Diagnosis not present

## 2015-09-16 DIAGNOSIS — N2581 Secondary hyperparathyroidism of renal origin: Secondary | ICD-10-CM | POA: Diagnosis not present

## 2015-09-19 DIAGNOSIS — E1151 Type 2 diabetes mellitus with diabetic peripheral angiopathy without gangrene: Secondary | ICD-10-CM | POA: Diagnosis not present

## 2015-09-19 DIAGNOSIS — N2581 Secondary hyperparathyroidism of renal origin: Secondary | ICD-10-CM | POA: Diagnosis not present

## 2015-09-19 DIAGNOSIS — E8770 Fluid overload, unspecified: Secondary | ICD-10-CM | POA: Diagnosis not present

## 2015-09-19 DIAGNOSIS — N186 End stage renal disease: Secondary | ICD-10-CM | POA: Diagnosis not present

## 2015-09-21 DIAGNOSIS — E1151 Type 2 diabetes mellitus with diabetic peripheral angiopathy without gangrene: Secondary | ICD-10-CM | POA: Diagnosis not present

## 2015-09-21 DIAGNOSIS — E8770 Fluid overload, unspecified: Secondary | ICD-10-CM | POA: Diagnosis not present

## 2015-09-21 DIAGNOSIS — N186 End stage renal disease: Secondary | ICD-10-CM | POA: Diagnosis not present

## 2015-09-21 DIAGNOSIS — N2581 Secondary hyperparathyroidism of renal origin: Secondary | ICD-10-CM | POA: Diagnosis not present

## 2015-09-23 DIAGNOSIS — E1151 Type 2 diabetes mellitus with diabetic peripheral angiopathy without gangrene: Secondary | ICD-10-CM | POA: Diagnosis not present

## 2015-09-23 DIAGNOSIS — N186 End stage renal disease: Secondary | ICD-10-CM | POA: Diagnosis not present

## 2015-09-23 DIAGNOSIS — N2581 Secondary hyperparathyroidism of renal origin: Secondary | ICD-10-CM | POA: Diagnosis not present

## 2015-09-23 DIAGNOSIS — E8770 Fluid overload, unspecified: Secondary | ICD-10-CM | POA: Diagnosis not present

## 2015-09-26 DIAGNOSIS — E8770 Fluid overload, unspecified: Secondary | ICD-10-CM | POA: Diagnosis not present

## 2015-09-26 DIAGNOSIS — N186 End stage renal disease: Secondary | ICD-10-CM | POA: Diagnosis not present

## 2015-09-26 DIAGNOSIS — N2581 Secondary hyperparathyroidism of renal origin: Secondary | ICD-10-CM | POA: Diagnosis not present

## 2015-09-26 DIAGNOSIS — E1151 Type 2 diabetes mellitus with diabetic peripheral angiopathy without gangrene: Secondary | ICD-10-CM | POA: Diagnosis not present

## 2015-09-27 DIAGNOSIS — T8620 Unspecified complication of heart transplant: Secondary | ICD-10-CM | POA: Diagnosis not present

## 2015-09-27 DIAGNOSIS — Z992 Dependence on renal dialysis: Secondary | ICD-10-CM | POA: Diagnosis not present

## 2015-09-27 DIAGNOSIS — N186 End stage renal disease: Secondary | ICD-10-CM | POA: Diagnosis not present

## 2015-09-28 DIAGNOSIS — D631 Anemia in chronic kidney disease: Secondary | ICD-10-CM | POA: Diagnosis not present

## 2015-09-28 DIAGNOSIS — N2581 Secondary hyperparathyroidism of renal origin: Secondary | ICD-10-CM | POA: Diagnosis not present

## 2015-09-28 DIAGNOSIS — N186 End stage renal disease: Secondary | ICD-10-CM | POA: Diagnosis not present

## 2015-09-28 DIAGNOSIS — E1151 Type 2 diabetes mellitus with diabetic peripheral angiopathy without gangrene: Secondary | ICD-10-CM | POA: Diagnosis not present

## 2015-09-29 DIAGNOSIS — Z9884 Bariatric surgery status: Secondary | ICD-10-CM | POA: Diagnosis not present

## 2015-09-29 DIAGNOSIS — I158 Other secondary hypertension: Secondary | ICD-10-CM | POA: Diagnosis not present

## 2015-09-29 DIAGNOSIS — Z48298 Encounter for aftercare following other organ transplant: Secondary | ICD-10-CM | POA: Diagnosis not present

## 2015-09-29 DIAGNOSIS — E669 Obesity, unspecified: Secondary | ICD-10-CM | POA: Diagnosis not present

## 2015-09-29 DIAGNOSIS — Z79899 Other long term (current) drug therapy: Secondary | ICD-10-CM | POA: Diagnosis not present

## 2015-09-29 DIAGNOSIS — N186 End stage renal disease: Secondary | ICD-10-CM | POA: Diagnosis not present

## 2015-09-29 DIAGNOSIS — Z992 Dependence on renal dialysis: Secondary | ICD-10-CM | POA: Diagnosis not present

## 2015-09-29 DIAGNOSIS — Z941 Heart transplant status: Secondary | ICD-10-CM | POA: Diagnosis not present

## 2015-09-30 DIAGNOSIS — E1151 Type 2 diabetes mellitus with diabetic peripheral angiopathy without gangrene: Secondary | ICD-10-CM | POA: Diagnosis not present

## 2015-09-30 DIAGNOSIS — N186 End stage renal disease: Secondary | ICD-10-CM | POA: Diagnosis not present

## 2015-09-30 DIAGNOSIS — D631 Anemia in chronic kidney disease: Secondary | ICD-10-CM | POA: Diagnosis not present

## 2015-09-30 DIAGNOSIS — N2581 Secondary hyperparathyroidism of renal origin: Secondary | ICD-10-CM | POA: Diagnosis not present

## 2015-10-03 DIAGNOSIS — E1151 Type 2 diabetes mellitus with diabetic peripheral angiopathy without gangrene: Secondary | ICD-10-CM | POA: Diagnosis not present

## 2015-10-03 DIAGNOSIS — N186 End stage renal disease: Secondary | ICD-10-CM | POA: Diagnosis not present

## 2015-10-03 DIAGNOSIS — D631 Anemia in chronic kidney disease: Secondary | ICD-10-CM | POA: Diagnosis not present

## 2015-10-03 DIAGNOSIS — N2581 Secondary hyperparathyroidism of renal origin: Secondary | ICD-10-CM | POA: Diagnosis not present

## 2015-10-04 DIAGNOSIS — E1122 Type 2 diabetes mellitus with diabetic chronic kidney disease: Secondary | ICD-10-CM | POA: Diagnosis not present

## 2015-10-04 DIAGNOSIS — N185 Chronic kidney disease, stage 5: Secondary | ICD-10-CM | POA: Diagnosis not present

## 2015-10-04 DIAGNOSIS — E782 Mixed hyperlipidemia: Secondary | ICD-10-CM | POA: Diagnosis not present

## 2015-10-04 DIAGNOSIS — Z941 Heart transplant status: Secondary | ICD-10-CM | POA: Diagnosis not present

## 2015-10-04 DIAGNOSIS — I10 Essential (primary) hypertension: Secondary | ICD-10-CM | POA: Diagnosis not present

## 2015-10-04 DIAGNOSIS — R635 Abnormal weight gain: Secondary | ICD-10-CM | POA: Diagnosis not present

## 2015-10-05 DIAGNOSIS — N2581 Secondary hyperparathyroidism of renal origin: Secondary | ICD-10-CM | POA: Diagnosis not present

## 2015-10-05 DIAGNOSIS — E1151 Type 2 diabetes mellitus with diabetic peripheral angiopathy without gangrene: Secondary | ICD-10-CM | POA: Diagnosis not present

## 2015-10-05 DIAGNOSIS — D631 Anemia in chronic kidney disease: Secondary | ICD-10-CM | POA: Diagnosis not present

## 2015-10-05 DIAGNOSIS — N186 End stage renal disease: Secondary | ICD-10-CM | POA: Diagnosis not present

## 2015-10-07 DIAGNOSIS — E1151 Type 2 diabetes mellitus with diabetic peripheral angiopathy without gangrene: Secondary | ICD-10-CM | POA: Diagnosis not present

## 2015-10-07 DIAGNOSIS — N2581 Secondary hyperparathyroidism of renal origin: Secondary | ICD-10-CM | POA: Diagnosis not present

## 2015-10-07 DIAGNOSIS — D631 Anemia in chronic kidney disease: Secondary | ICD-10-CM | POA: Diagnosis not present

## 2015-10-07 DIAGNOSIS — N186 End stage renal disease: Secondary | ICD-10-CM | POA: Diagnosis not present

## 2015-10-10 DIAGNOSIS — N186 End stage renal disease: Secondary | ICD-10-CM | POA: Diagnosis not present

## 2015-10-10 DIAGNOSIS — D631 Anemia in chronic kidney disease: Secondary | ICD-10-CM | POA: Diagnosis not present

## 2015-10-10 DIAGNOSIS — N2581 Secondary hyperparathyroidism of renal origin: Secondary | ICD-10-CM | POA: Diagnosis not present

## 2015-10-10 DIAGNOSIS — E1151 Type 2 diabetes mellitus with diabetic peripheral angiopathy without gangrene: Secondary | ICD-10-CM | POA: Diagnosis not present

## 2015-10-11 DIAGNOSIS — N186 End stage renal disease: Secondary | ICD-10-CM | POA: Diagnosis not present

## 2015-10-11 DIAGNOSIS — I871 Compression of vein: Secondary | ICD-10-CM | POA: Diagnosis not present

## 2015-10-11 DIAGNOSIS — T82858A Stenosis of vascular prosthetic devices, implants and grafts, initial encounter: Secondary | ICD-10-CM | POA: Diagnosis not present

## 2015-10-11 DIAGNOSIS — Z992 Dependence on renal dialysis: Secondary | ICD-10-CM | POA: Diagnosis not present

## 2015-10-12 DIAGNOSIS — E1151 Type 2 diabetes mellitus with diabetic peripheral angiopathy without gangrene: Secondary | ICD-10-CM | POA: Diagnosis not present

## 2015-10-12 DIAGNOSIS — D631 Anemia in chronic kidney disease: Secondary | ICD-10-CM | POA: Diagnosis not present

## 2015-10-12 DIAGNOSIS — N2581 Secondary hyperparathyroidism of renal origin: Secondary | ICD-10-CM | POA: Diagnosis not present

## 2015-10-12 DIAGNOSIS — N186 End stage renal disease: Secondary | ICD-10-CM | POA: Diagnosis not present

## 2015-10-14 ENCOUNTER — Other Ambulatory Visit: Payer: Self-pay

## 2015-10-14 DIAGNOSIS — N2581 Secondary hyperparathyroidism of renal origin: Secondary | ICD-10-CM | POA: Diagnosis not present

## 2015-10-14 DIAGNOSIS — D631 Anemia in chronic kidney disease: Secondary | ICD-10-CM | POA: Diagnosis not present

## 2015-10-14 DIAGNOSIS — N186 End stage renal disease: Secondary | ICD-10-CM

## 2015-10-14 DIAGNOSIS — T82510A Breakdown (mechanical) of surgically created arteriovenous fistula, initial encounter: Secondary | ICD-10-CM

## 2015-10-14 DIAGNOSIS — E1151 Type 2 diabetes mellitus with diabetic peripheral angiopathy without gangrene: Secondary | ICD-10-CM | POA: Diagnosis not present

## 2015-10-17 DIAGNOSIS — N186 End stage renal disease: Secondary | ICD-10-CM | POA: Diagnosis not present

## 2015-10-17 DIAGNOSIS — N2581 Secondary hyperparathyroidism of renal origin: Secondary | ICD-10-CM | POA: Diagnosis not present

## 2015-10-17 DIAGNOSIS — E1151 Type 2 diabetes mellitus with diabetic peripheral angiopathy without gangrene: Secondary | ICD-10-CM | POA: Diagnosis not present

## 2015-10-17 DIAGNOSIS — D631 Anemia in chronic kidney disease: Secondary | ICD-10-CM | POA: Diagnosis not present

## 2015-10-19 DIAGNOSIS — N186 End stage renal disease: Secondary | ICD-10-CM | POA: Diagnosis not present

## 2015-10-19 DIAGNOSIS — D631 Anemia in chronic kidney disease: Secondary | ICD-10-CM | POA: Diagnosis not present

## 2015-10-19 DIAGNOSIS — E1151 Type 2 diabetes mellitus with diabetic peripheral angiopathy without gangrene: Secondary | ICD-10-CM | POA: Diagnosis not present

## 2015-10-19 DIAGNOSIS — N2581 Secondary hyperparathyroidism of renal origin: Secondary | ICD-10-CM | POA: Diagnosis not present

## 2015-10-20 ENCOUNTER — Encounter: Payer: Self-pay | Admitting: Vascular Surgery

## 2015-10-21 DIAGNOSIS — N2581 Secondary hyperparathyroidism of renal origin: Secondary | ICD-10-CM | POA: Diagnosis not present

## 2015-10-21 DIAGNOSIS — E1151 Type 2 diabetes mellitus with diabetic peripheral angiopathy without gangrene: Secondary | ICD-10-CM | POA: Diagnosis not present

## 2015-10-21 DIAGNOSIS — N186 End stage renal disease: Secondary | ICD-10-CM | POA: Diagnosis not present

## 2015-10-21 DIAGNOSIS — D631 Anemia in chronic kidney disease: Secondary | ICD-10-CM | POA: Diagnosis not present

## 2015-10-24 DIAGNOSIS — E1151 Type 2 diabetes mellitus with diabetic peripheral angiopathy without gangrene: Secondary | ICD-10-CM | POA: Diagnosis not present

## 2015-10-24 DIAGNOSIS — D631 Anemia in chronic kidney disease: Secondary | ICD-10-CM | POA: Diagnosis not present

## 2015-10-24 DIAGNOSIS — N186 End stage renal disease: Secondary | ICD-10-CM | POA: Diagnosis not present

## 2015-10-24 DIAGNOSIS — N2581 Secondary hyperparathyroidism of renal origin: Secondary | ICD-10-CM | POA: Diagnosis not present

## 2015-10-25 DIAGNOSIS — N186 End stage renal disease: Secondary | ICD-10-CM | POA: Diagnosis not present

## 2015-10-25 DIAGNOSIS — Z992 Dependence on renal dialysis: Secondary | ICD-10-CM | POA: Diagnosis not present

## 2015-10-25 DIAGNOSIS — T8620 Unspecified complication of heart transplant: Secondary | ICD-10-CM | POA: Diagnosis not present

## 2015-10-26 DIAGNOSIS — N2581 Secondary hyperparathyroidism of renal origin: Secondary | ICD-10-CM | POA: Diagnosis not present

## 2015-10-26 DIAGNOSIS — E1151 Type 2 diabetes mellitus with diabetic peripheral angiopathy without gangrene: Secondary | ICD-10-CM | POA: Diagnosis not present

## 2015-10-26 DIAGNOSIS — D631 Anemia in chronic kidney disease: Secondary | ICD-10-CM | POA: Diagnosis not present

## 2015-10-26 DIAGNOSIS — E8770 Fluid overload, unspecified: Secondary | ICD-10-CM | POA: Diagnosis not present

## 2015-10-26 DIAGNOSIS — N186 End stage renal disease: Secondary | ICD-10-CM | POA: Diagnosis not present

## 2015-10-28 ENCOUNTER — Inpatient Hospital Stay (HOSPITAL_COMMUNITY)
Admission: RE | Admit: 2015-10-28 | Discharge: 2015-10-28 | Disposition: A | Discharge status: Home / Self Care | Payer: Medicare Other | Source: Ambulatory Visit

## 2015-10-28 ENCOUNTER — Ambulatory Visit: Payer: Medicare Other | Admitting: Vascular Surgery

## 2015-10-28 DIAGNOSIS — E8770 Fluid overload, unspecified: Secondary | ICD-10-CM | POA: Diagnosis not present

## 2015-10-28 DIAGNOSIS — N186 End stage renal disease: Secondary | ICD-10-CM | POA: Diagnosis not present

## 2015-10-28 DIAGNOSIS — E1151 Type 2 diabetes mellitus with diabetic peripheral angiopathy without gangrene: Secondary | ICD-10-CM | POA: Diagnosis not present

## 2015-10-28 DIAGNOSIS — T82510A Breakdown (mechanical) of surgically created arteriovenous fistula, initial encounter: Secondary | ICD-10-CM

## 2015-10-28 DIAGNOSIS — D631 Anemia in chronic kidney disease: Secondary | ICD-10-CM | POA: Diagnosis not present

## 2015-10-28 DIAGNOSIS — N2581 Secondary hyperparathyroidism of renal origin: Secondary | ICD-10-CM | POA: Diagnosis not present

## 2015-10-31 DIAGNOSIS — N2581 Secondary hyperparathyroidism of renal origin: Secondary | ICD-10-CM | POA: Diagnosis not present

## 2015-10-31 DIAGNOSIS — E1151 Type 2 diabetes mellitus with diabetic peripheral angiopathy without gangrene: Secondary | ICD-10-CM | POA: Diagnosis not present

## 2015-10-31 DIAGNOSIS — D631 Anemia in chronic kidney disease: Secondary | ICD-10-CM | POA: Diagnosis not present

## 2015-10-31 DIAGNOSIS — E8770 Fluid overload, unspecified: Secondary | ICD-10-CM | POA: Diagnosis not present

## 2015-10-31 DIAGNOSIS — N186 End stage renal disease: Secondary | ICD-10-CM | POA: Diagnosis not present

## 2015-11-02 DIAGNOSIS — E1151 Type 2 diabetes mellitus with diabetic peripheral angiopathy without gangrene: Secondary | ICD-10-CM | POA: Diagnosis not present

## 2015-11-02 DIAGNOSIS — D631 Anemia in chronic kidney disease: Secondary | ICD-10-CM | POA: Diagnosis not present

## 2015-11-02 DIAGNOSIS — E8770 Fluid overload, unspecified: Secondary | ICD-10-CM | POA: Diagnosis not present

## 2015-11-02 DIAGNOSIS — N2581 Secondary hyperparathyroidism of renal origin: Secondary | ICD-10-CM | POA: Diagnosis not present

## 2015-11-02 DIAGNOSIS — N186 End stage renal disease: Secondary | ICD-10-CM | POA: Diagnosis not present

## 2015-11-04 DIAGNOSIS — D631 Anemia in chronic kidney disease: Secondary | ICD-10-CM | POA: Diagnosis not present

## 2015-11-04 DIAGNOSIS — N186 End stage renal disease: Secondary | ICD-10-CM | POA: Diagnosis not present

## 2015-11-04 DIAGNOSIS — E8770 Fluid overload, unspecified: Secondary | ICD-10-CM | POA: Diagnosis not present

## 2015-11-04 DIAGNOSIS — N2581 Secondary hyperparathyroidism of renal origin: Secondary | ICD-10-CM | POA: Diagnosis not present

## 2015-11-04 DIAGNOSIS — E1151 Type 2 diabetes mellitus with diabetic peripheral angiopathy without gangrene: Secondary | ICD-10-CM | POA: Diagnosis not present

## 2015-11-07 DIAGNOSIS — E8770 Fluid overload, unspecified: Secondary | ICD-10-CM | POA: Diagnosis not present

## 2015-11-07 DIAGNOSIS — E1151 Type 2 diabetes mellitus with diabetic peripheral angiopathy without gangrene: Secondary | ICD-10-CM | POA: Diagnosis not present

## 2015-11-07 DIAGNOSIS — D631 Anemia in chronic kidney disease: Secondary | ICD-10-CM | POA: Diagnosis not present

## 2015-11-07 DIAGNOSIS — N2581 Secondary hyperparathyroidism of renal origin: Secondary | ICD-10-CM | POA: Diagnosis not present

## 2015-11-07 DIAGNOSIS — N186 End stage renal disease: Secondary | ICD-10-CM | POA: Diagnosis not present

## 2015-11-09 DIAGNOSIS — N2581 Secondary hyperparathyroidism of renal origin: Secondary | ICD-10-CM | POA: Diagnosis not present

## 2015-11-09 DIAGNOSIS — D631 Anemia in chronic kidney disease: Secondary | ICD-10-CM | POA: Diagnosis not present

## 2015-11-09 DIAGNOSIS — E8770 Fluid overload, unspecified: Secondary | ICD-10-CM | POA: Diagnosis not present

## 2015-11-09 DIAGNOSIS — N186 End stage renal disease: Secondary | ICD-10-CM | POA: Diagnosis not present

## 2015-11-09 DIAGNOSIS — E1151 Type 2 diabetes mellitus with diabetic peripheral angiopathy without gangrene: Secondary | ICD-10-CM | POA: Diagnosis not present

## 2015-11-11 DIAGNOSIS — D631 Anemia in chronic kidney disease: Secondary | ICD-10-CM | POA: Diagnosis not present

## 2015-11-11 DIAGNOSIS — E8770 Fluid overload, unspecified: Secondary | ICD-10-CM | POA: Diagnosis not present

## 2015-11-11 DIAGNOSIS — N2581 Secondary hyperparathyroidism of renal origin: Secondary | ICD-10-CM | POA: Diagnosis not present

## 2015-11-11 DIAGNOSIS — E1151 Type 2 diabetes mellitus with diabetic peripheral angiopathy without gangrene: Secondary | ICD-10-CM | POA: Diagnosis not present

## 2015-11-11 DIAGNOSIS — N186 End stage renal disease: Secondary | ICD-10-CM | POA: Diagnosis not present

## 2015-11-14 DIAGNOSIS — E8770 Fluid overload, unspecified: Secondary | ICD-10-CM | POA: Diagnosis not present

## 2015-11-14 DIAGNOSIS — N2581 Secondary hyperparathyroidism of renal origin: Secondary | ICD-10-CM | POA: Diagnosis not present

## 2015-11-14 DIAGNOSIS — D631 Anemia in chronic kidney disease: Secondary | ICD-10-CM | POA: Diagnosis not present

## 2015-11-14 DIAGNOSIS — N186 End stage renal disease: Secondary | ICD-10-CM | POA: Diagnosis not present

## 2015-11-14 DIAGNOSIS — E1151 Type 2 diabetes mellitus with diabetic peripheral angiopathy without gangrene: Secondary | ICD-10-CM | POA: Diagnosis not present

## 2015-11-16 DIAGNOSIS — N2581 Secondary hyperparathyroidism of renal origin: Secondary | ICD-10-CM | POA: Diagnosis not present

## 2015-11-16 DIAGNOSIS — N186 End stage renal disease: Secondary | ICD-10-CM | POA: Diagnosis not present

## 2015-11-16 DIAGNOSIS — D631 Anemia in chronic kidney disease: Secondary | ICD-10-CM | POA: Diagnosis not present

## 2015-11-16 DIAGNOSIS — E8770 Fluid overload, unspecified: Secondary | ICD-10-CM | POA: Diagnosis not present

## 2015-11-16 DIAGNOSIS — E1151 Type 2 diabetes mellitus with diabetic peripheral angiopathy without gangrene: Secondary | ICD-10-CM | POA: Diagnosis not present

## 2015-11-18 DIAGNOSIS — N2581 Secondary hyperparathyroidism of renal origin: Secondary | ICD-10-CM | POA: Diagnosis not present

## 2015-11-18 DIAGNOSIS — E1151 Type 2 diabetes mellitus with diabetic peripheral angiopathy without gangrene: Secondary | ICD-10-CM | POA: Diagnosis not present

## 2015-11-18 DIAGNOSIS — E8770 Fluid overload, unspecified: Secondary | ICD-10-CM | POA: Diagnosis not present

## 2015-11-18 DIAGNOSIS — D631 Anemia in chronic kidney disease: Secondary | ICD-10-CM | POA: Diagnosis not present

## 2015-11-18 DIAGNOSIS — N186 End stage renal disease: Secondary | ICD-10-CM | POA: Diagnosis not present

## 2015-11-21 DIAGNOSIS — D631 Anemia in chronic kidney disease: Secondary | ICD-10-CM | POA: Diagnosis not present

## 2015-11-21 DIAGNOSIS — E1151 Type 2 diabetes mellitus with diabetic peripheral angiopathy without gangrene: Secondary | ICD-10-CM | POA: Diagnosis not present

## 2015-11-21 DIAGNOSIS — N2581 Secondary hyperparathyroidism of renal origin: Secondary | ICD-10-CM | POA: Diagnosis not present

## 2015-11-21 DIAGNOSIS — E8770 Fluid overload, unspecified: Secondary | ICD-10-CM | POA: Diagnosis not present

## 2015-11-21 DIAGNOSIS — N186 End stage renal disease: Secondary | ICD-10-CM | POA: Diagnosis not present

## 2015-11-22 DIAGNOSIS — E1151 Type 2 diabetes mellitus with diabetic peripheral angiopathy without gangrene: Secondary | ICD-10-CM | POA: Diagnosis not present

## 2015-11-22 DIAGNOSIS — D631 Anemia in chronic kidney disease: Secondary | ICD-10-CM | POA: Diagnosis not present

## 2015-11-22 DIAGNOSIS — N186 End stage renal disease: Secondary | ICD-10-CM | POA: Diagnosis not present

## 2015-11-22 DIAGNOSIS — N2581 Secondary hyperparathyroidism of renal origin: Secondary | ICD-10-CM | POA: Diagnosis not present

## 2015-11-22 DIAGNOSIS — E8770 Fluid overload, unspecified: Secondary | ICD-10-CM | POA: Diagnosis not present

## 2015-11-23 DIAGNOSIS — E8770 Fluid overload, unspecified: Secondary | ICD-10-CM | POA: Diagnosis not present

## 2015-11-23 DIAGNOSIS — N186 End stage renal disease: Secondary | ICD-10-CM | POA: Diagnosis not present

## 2015-11-23 DIAGNOSIS — D631 Anemia in chronic kidney disease: Secondary | ICD-10-CM | POA: Diagnosis not present

## 2015-11-23 DIAGNOSIS — E1151 Type 2 diabetes mellitus with diabetic peripheral angiopathy without gangrene: Secondary | ICD-10-CM | POA: Diagnosis not present

## 2015-11-23 DIAGNOSIS — N2581 Secondary hyperparathyroidism of renal origin: Secondary | ICD-10-CM | POA: Diagnosis not present

## 2015-11-25 DIAGNOSIS — N2581 Secondary hyperparathyroidism of renal origin: Secondary | ICD-10-CM | POA: Diagnosis not present

## 2015-11-25 DIAGNOSIS — T8620 Unspecified complication of heart transplant: Secondary | ICD-10-CM | POA: Diagnosis not present

## 2015-11-25 DIAGNOSIS — N186 End stage renal disease: Secondary | ICD-10-CM | POA: Diagnosis not present

## 2015-11-25 DIAGNOSIS — D631 Anemia in chronic kidney disease: Secondary | ICD-10-CM | POA: Diagnosis not present

## 2015-11-25 DIAGNOSIS — E1151 Type 2 diabetes mellitus with diabetic peripheral angiopathy without gangrene: Secondary | ICD-10-CM | POA: Diagnosis not present

## 2015-11-25 DIAGNOSIS — E8770 Fluid overload, unspecified: Secondary | ICD-10-CM | POA: Diagnosis not present

## 2015-11-25 DIAGNOSIS — Z992 Dependence on renal dialysis: Secondary | ICD-10-CM | POA: Diagnosis not present

## 2015-11-28 DIAGNOSIS — N186 End stage renal disease: Secondary | ICD-10-CM | POA: Diagnosis not present

## 2015-11-28 DIAGNOSIS — D631 Anemia in chronic kidney disease: Secondary | ICD-10-CM | POA: Diagnosis not present

## 2015-11-28 DIAGNOSIS — N2581 Secondary hyperparathyroidism of renal origin: Secondary | ICD-10-CM | POA: Diagnosis not present

## 2015-11-28 DIAGNOSIS — E1151 Type 2 diabetes mellitus with diabetic peripheral angiopathy without gangrene: Secondary | ICD-10-CM | POA: Diagnosis not present

## 2015-11-30 DIAGNOSIS — N2581 Secondary hyperparathyroidism of renal origin: Secondary | ICD-10-CM | POA: Diagnosis not present

## 2015-11-30 DIAGNOSIS — N186 End stage renal disease: Secondary | ICD-10-CM | POA: Diagnosis not present

## 2015-11-30 DIAGNOSIS — E1151 Type 2 diabetes mellitus with diabetic peripheral angiopathy without gangrene: Secondary | ICD-10-CM | POA: Diagnosis not present

## 2015-11-30 DIAGNOSIS — D631 Anemia in chronic kidney disease: Secondary | ICD-10-CM | POA: Diagnosis not present

## 2015-12-02 DIAGNOSIS — D631 Anemia in chronic kidney disease: Secondary | ICD-10-CM | POA: Diagnosis not present

## 2015-12-02 DIAGNOSIS — E1151 Type 2 diabetes mellitus with diabetic peripheral angiopathy without gangrene: Secondary | ICD-10-CM | POA: Diagnosis not present

## 2015-12-02 DIAGNOSIS — N2581 Secondary hyperparathyroidism of renal origin: Secondary | ICD-10-CM | POA: Diagnosis not present

## 2015-12-02 DIAGNOSIS — N186 End stage renal disease: Secondary | ICD-10-CM | POA: Diagnosis not present

## 2015-12-05 DIAGNOSIS — N186 End stage renal disease: Secondary | ICD-10-CM | POA: Diagnosis not present

## 2015-12-05 DIAGNOSIS — D631 Anemia in chronic kidney disease: Secondary | ICD-10-CM | POA: Diagnosis not present

## 2015-12-05 DIAGNOSIS — E1151 Type 2 diabetes mellitus with diabetic peripheral angiopathy without gangrene: Secondary | ICD-10-CM | POA: Diagnosis not present

## 2015-12-05 DIAGNOSIS — N2581 Secondary hyperparathyroidism of renal origin: Secondary | ICD-10-CM | POA: Diagnosis not present

## 2015-12-07 DIAGNOSIS — N186 End stage renal disease: Secondary | ICD-10-CM | POA: Diagnosis not present

## 2015-12-07 DIAGNOSIS — D631 Anemia in chronic kidney disease: Secondary | ICD-10-CM | POA: Diagnosis not present

## 2015-12-07 DIAGNOSIS — N2581 Secondary hyperparathyroidism of renal origin: Secondary | ICD-10-CM | POA: Diagnosis not present

## 2015-12-07 DIAGNOSIS — E1151 Type 2 diabetes mellitus with diabetic peripheral angiopathy without gangrene: Secondary | ICD-10-CM | POA: Diagnosis not present

## 2015-12-09 DIAGNOSIS — D631 Anemia in chronic kidney disease: Secondary | ICD-10-CM | POA: Diagnosis not present

## 2015-12-09 DIAGNOSIS — N2581 Secondary hyperparathyroidism of renal origin: Secondary | ICD-10-CM | POA: Diagnosis not present

## 2015-12-09 DIAGNOSIS — E1151 Type 2 diabetes mellitus with diabetic peripheral angiopathy without gangrene: Secondary | ICD-10-CM | POA: Diagnosis not present

## 2015-12-09 DIAGNOSIS — N186 End stage renal disease: Secondary | ICD-10-CM | POA: Diagnosis not present

## 2015-12-12 DIAGNOSIS — N2581 Secondary hyperparathyroidism of renal origin: Secondary | ICD-10-CM | POA: Diagnosis not present

## 2015-12-12 DIAGNOSIS — E1151 Type 2 diabetes mellitus with diabetic peripheral angiopathy without gangrene: Secondary | ICD-10-CM | POA: Diagnosis not present

## 2015-12-12 DIAGNOSIS — N186 End stage renal disease: Secondary | ICD-10-CM | POA: Diagnosis not present

## 2015-12-12 DIAGNOSIS — D631 Anemia in chronic kidney disease: Secondary | ICD-10-CM | POA: Diagnosis not present

## 2015-12-13 DIAGNOSIS — Z9109 Other allergy status, other than to drugs and biological substances: Secondary | ICD-10-CM | POA: Diagnosis not present

## 2015-12-13 DIAGNOSIS — E1143 Type 2 diabetes mellitus with diabetic autonomic (poly)neuropathy: Secondary | ICD-10-CM | POA: Diagnosis not present

## 2015-12-13 DIAGNOSIS — E1122 Type 2 diabetes mellitus with diabetic chronic kidney disease: Secondary | ICD-10-CM | POA: Diagnosis not present

## 2015-12-14 DIAGNOSIS — E1151 Type 2 diabetes mellitus with diabetic peripheral angiopathy without gangrene: Secondary | ICD-10-CM | POA: Diagnosis not present

## 2015-12-14 DIAGNOSIS — N186 End stage renal disease: Secondary | ICD-10-CM | POA: Diagnosis not present

## 2015-12-14 DIAGNOSIS — D631 Anemia in chronic kidney disease: Secondary | ICD-10-CM | POA: Diagnosis not present

## 2015-12-14 DIAGNOSIS — N2581 Secondary hyperparathyroidism of renal origin: Secondary | ICD-10-CM | POA: Diagnosis not present

## 2015-12-16 DIAGNOSIS — N186 End stage renal disease: Secondary | ICD-10-CM | POA: Diagnosis not present

## 2015-12-16 DIAGNOSIS — D631 Anemia in chronic kidney disease: Secondary | ICD-10-CM | POA: Diagnosis not present

## 2015-12-16 DIAGNOSIS — E1151 Type 2 diabetes mellitus with diabetic peripheral angiopathy without gangrene: Secondary | ICD-10-CM | POA: Diagnosis not present

## 2015-12-16 DIAGNOSIS — N2581 Secondary hyperparathyroidism of renal origin: Secondary | ICD-10-CM | POA: Diagnosis not present

## 2015-12-19 DIAGNOSIS — D631 Anemia in chronic kidney disease: Secondary | ICD-10-CM | POA: Diagnosis not present

## 2015-12-19 DIAGNOSIS — E1151 Type 2 diabetes mellitus with diabetic peripheral angiopathy without gangrene: Secondary | ICD-10-CM | POA: Diagnosis not present

## 2015-12-19 DIAGNOSIS — N186 End stage renal disease: Secondary | ICD-10-CM | POA: Diagnosis not present

## 2015-12-19 DIAGNOSIS — N2581 Secondary hyperparathyroidism of renal origin: Secondary | ICD-10-CM | POA: Diagnosis not present

## 2015-12-21 DIAGNOSIS — N186 End stage renal disease: Secondary | ICD-10-CM | POA: Diagnosis not present

## 2015-12-21 DIAGNOSIS — N2581 Secondary hyperparathyroidism of renal origin: Secondary | ICD-10-CM | POA: Diagnosis not present

## 2015-12-21 DIAGNOSIS — D631 Anemia in chronic kidney disease: Secondary | ICD-10-CM | POA: Diagnosis not present

## 2015-12-21 DIAGNOSIS — E1151 Type 2 diabetes mellitus with diabetic peripheral angiopathy without gangrene: Secondary | ICD-10-CM | POA: Diagnosis not present

## 2015-12-22 DIAGNOSIS — E1151 Type 2 diabetes mellitus with diabetic peripheral angiopathy without gangrene: Secondary | ICD-10-CM | POA: Diagnosis not present

## 2015-12-22 DIAGNOSIS — N186 End stage renal disease: Secondary | ICD-10-CM | POA: Diagnosis not present

## 2015-12-22 DIAGNOSIS — D631 Anemia in chronic kidney disease: Secondary | ICD-10-CM | POA: Diagnosis not present

## 2015-12-22 DIAGNOSIS — N2581 Secondary hyperparathyroidism of renal origin: Secondary | ICD-10-CM | POA: Diagnosis not present

## 2015-12-23 DIAGNOSIS — N186 End stage renal disease: Secondary | ICD-10-CM | POA: Diagnosis not present

## 2015-12-23 DIAGNOSIS — D631 Anemia in chronic kidney disease: Secondary | ICD-10-CM | POA: Diagnosis not present

## 2015-12-23 DIAGNOSIS — E1151 Type 2 diabetes mellitus with diabetic peripheral angiopathy without gangrene: Secondary | ICD-10-CM | POA: Diagnosis not present

## 2015-12-23 DIAGNOSIS — N2581 Secondary hyperparathyroidism of renal origin: Secondary | ICD-10-CM | POA: Diagnosis not present

## 2015-12-25 DIAGNOSIS — T8620 Unspecified complication of heart transplant: Secondary | ICD-10-CM | POA: Diagnosis not present

## 2015-12-25 DIAGNOSIS — Z992 Dependence on renal dialysis: Secondary | ICD-10-CM | POA: Diagnosis not present

## 2015-12-25 DIAGNOSIS — N186 End stage renal disease: Secondary | ICD-10-CM | POA: Diagnosis not present

## 2015-12-26 DIAGNOSIS — D509 Iron deficiency anemia, unspecified: Secondary | ICD-10-CM | POA: Diagnosis not present

## 2015-12-26 DIAGNOSIS — D508 Other iron deficiency anemias: Secondary | ICD-10-CM | POA: Diagnosis not present

## 2015-12-26 DIAGNOSIS — N186 End stage renal disease: Secondary | ICD-10-CM | POA: Diagnosis not present

## 2015-12-26 DIAGNOSIS — E8770 Fluid overload, unspecified: Secondary | ICD-10-CM | POA: Diagnosis not present

## 2015-12-26 DIAGNOSIS — N2581 Secondary hyperparathyroidism of renal origin: Secondary | ICD-10-CM | POA: Diagnosis not present

## 2015-12-26 DIAGNOSIS — D631 Anemia in chronic kidney disease: Secondary | ICD-10-CM | POA: Diagnosis not present

## 2015-12-26 DIAGNOSIS — E1151 Type 2 diabetes mellitus with diabetic peripheral angiopathy without gangrene: Secondary | ICD-10-CM | POA: Diagnosis not present

## 2015-12-28 DIAGNOSIS — E1151 Type 2 diabetes mellitus with diabetic peripheral angiopathy without gangrene: Secondary | ICD-10-CM | POA: Diagnosis not present

## 2015-12-28 DIAGNOSIS — N2581 Secondary hyperparathyroidism of renal origin: Secondary | ICD-10-CM | POA: Diagnosis not present

## 2015-12-28 DIAGNOSIS — N186 End stage renal disease: Secondary | ICD-10-CM | POA: Diagnosis not present

## 2015-12-28 DIAGNOSIS — D631 Anemia in chronic kidney disease: Secondary | ICD-10-CM | POA: Diagnosis not present

## 2015-12-28 DIAGNOSIS — D509 Iron deficiency anemia, unspecified: Secondary | ICD-10-CM | POA: Diagnosis not present

## 2015-12-28 DIAGNOSIS — D508 Other iron deficiency anemias: Secondary | ICD-10-CM | POA: Diagnosis not present

## 2015-12-29 DIAGNOSIS — I444 Left anterior fascicular block: Secondary | ICD-10-CM | POA: Diagnosis not present

## 2015-12-29 DIAGNOSIS — N186 End stage renal disease: Secondary | ICD-10-CM | POA: Diagnosis not present

## 2015-12-29 DIAGNOSIS — R739 Hyperglycemia, unspecified: Secondary | ICD-10-CM | POA: Diagnosis not present

## 2015-12-29 DIAGNOSIS — Z01818 Encounter for other preprocedural examination: Secondary | ICD-10-CM | POA: Diagnosis not present

## 2015-12-29 DIAGNOSIS — N189 Chronic kidney disease, unspecified: Secondary | ICD-10-CM | POA: Diagnosis not present

## 2015-12-29 DIAGNOSIS — N2581 Secondary hyperparathyroidism of renal origin: Secondary | ICD-10-CM | POA: Diagnosis not present

## 2015-12-29 DIAGNOSIS — D509 Iron deficiency anemia, unspecified: Secondary | ICD-10-CM | POA: Diagnosis not present

## 2015-12-29 DIAGNOSIS — I639 Cerebral infarction, unspecified: Secondary | ICD-10-CM | POA: Diagnosis not present

## 2015-12-29 DIAGNOSIS — F419 Anxiety disorder, unspecified: Secondary | ICD-10-CM | POA: Diagnosis not present

## 2015-12-29 DIAGNOSIS — Z941 Heart transplant status: Secondary | ICD-10-CM | POA: Diagnosis not present

## 2015-12-29 DIAGNOSIS — R7303 Prediabetes: Secondary | ICD-10-CM | POA: Diagnosis not present

## 2015-12-29 DIAGNOSIS — Z992 Dependence on renal dialysis: Secondary | ICD-10-CM | POA: Diagnosis not present

## 2015-12-29 DIAGNOSIS — I491 Atrial premature depolarization: Secondary | ICD-10-CM | POA: Diagnosis not present

## 2015-12-29 DIAGNOSIS — Z9884 Bariatric surgery status: Secondary | ICD-10-CM | POA: Diagnosis not present

## 2015-12-29 DIAGNOSIS — D508 Other iron deficiency anemias: Secondary | ICD-10-CM | POA: Diagnosis not present

## 2015-12-29 DIAGNOSIS — Z6841 Body Mass Index (BMI) 40.0 and over, adult: Secondary | ICD-10-CM | POA: Diagnosis not present

## 2015-12-29 DIAGNOSIS — I4510 Unspecified right bundle-branch block: Secondary | ICD-10-CM | POA: Diagnosis not present

## 2015-12-29 DIAGNOSIS — D631 Anemia in chronic kidney disease: Secondary | ICD-10-CM | POA: Diagnosis not present

## 2015-12-29 DIAGNOSIS — Z7189 Other specified counseling: Secondary | ICD-10-CM | POA: Diagnosis not present

## 2015-12-29 DIAGNOSIS — E1151 Type 2 diabetes mellitus with diabetic peripheral angiopathy without gangrene: Secondary | ICD-10-CM | POA: Diagnosis not present

## 2015-12-30 DIAGNOSIS — N186 End stage renal disease: Secondary | ICD-10-CM | POA: Diagnosis not present

## 2015-12-30 DIAGNOSIS — D509 Iron deficiency anemia, unspecified: Secondary | ICD-10-CM | POA: Diagnosis not present

## 2015-12-30 DIAGNOSIS — E1151 Type 2 diabetes mellitus with diabetic peripheral angiopathy without gangrene: Secondary | ICD-10-CM | POA: Diagnosis not present

## 2015-12-30 DIAGNOSIS — N2581 Secondary hyperparathyroidism of renal origin: Secondary | ICD-10-CM | POA: Diagnosis not present

## 2015-12-30 DIAGNOSIS — D508 Other iron deficiency anemias: Secondary | ICD-10-CM | POA: Diagnosis not present

## 2015-12-30 DIAGNOSIS — D631 Anemia in chronic kidney disease: Secondary | ICD-10-CM | POA: Diagnosis not present

## 2016-01-02 DIAGNOSIS — D631 Anemia in chronic kidney disease: Secondary | ICD-10-CM | POA: Diagnosis not present

## 2016-01-02 DIAGNOSIS — D508 Other iron deficiency anemias: Secondary | ICD-10-CM | POA: Diagnosis not present

## 2016-01-02 DIAGNOSIS — E1151 Type 2 diabetes mellitus with diabetic peripheral angiopathy without gangrene: Secondary | ICD-10-CM | POA: Diagnosis not present

## 2016-01-02 DIAGNOSIS — N2581 Secondary hyperparathyroidism of renal origin: Secondary | ICD-10-CM | POA: Diagnosis not present

## 2016-01-02 DIAGNOSIS — D509 Iron deficiency anemia, unspecified: Secondary | ICD-10-CM | POA: Diagnosis not present

## 2016-01-02 DIAGNOSIS — N186 End stage renal disease: Secondary | ICD-10-CM | POA: Diagnosis not present

## 2016-01-04 DIAGNOSIS — D508 Other iron deficiency anemias: Secondary | ICD-10-CM | POA: Diagnosis not present

## 2016-01-04 DIAGNOSIS — E1151 Type 2 diabetes mellitus with diabetic peripheral angiopathy without gangrene: Secondary | ICD-10-CM | POA: Diagnosis not present

## 2016-01-04 DIAGNOSIS — D631 Anemia in chronic kidney disease: Secondary | ICD-10-CM | POA: Diagnosis not present

## 2016-01-04 DIAGNOSIS — D509 Iron deficiency anemia, unspecified: Secondary | ICD-10-CM | POA: Diagnosis not present

## 2016-01-04 DIAGNOSIS — N186 End stage renal disease: Secondary | ICD-10-CM | POA: Diagnosis not present

## 2016-01-04 DIAGNOSIS — N2581 Secondary hyperparathyroidism of renal origin: Secondary | ICD-10-CM | POA: Diagnosis not present

## 2016-01-06 DIAGNOSIS — D631 Anemia in chronic kidney disease: Secondary | ICD-10-CM | POA: Diagnosis not present

## 2016-01-06 DIAGNOSIS — N2581 Secondary hyperparathyroidism of renal origin: Secondary | ICD-10-CM | POA: Diagnosis not present

## 2016-01-06 DIAGNOSIS — D508 Other iron deficiency anemias: Secondary | ICD-10-CM | POA: Diagnosis not present

## 2016-01-06 DIAGNOSIS — D509 Iron deficiency anemia, unspecified: Secondary | ICD-10-CM | POA: Diagnosis not present

## 2016-01-06 DIAGNOSIS — N186 End stage renal disease: Secondary | ICD-10-CM | POA: Diagnosis not present

## 2016-01-06 DIAGNOSIS — E1151 Type 2 diabetes mellitus with diabetic peripheral angiopathy without gangrene: Secondary | ICD-10-CM | POA: Diagnosis not present

## 2016-01-09 DIAGNOSIS — D631 Anemia in chronic kidney disease: Secondary | ICD-10-CM | POA: Diagnosis not present

## 2016-01-09 DIAGNOSIS — D508 Other iron deficiency anemias: Secondary | ICD-10-CM | POA: Diagnosis not present

## 2016-01-09 DIAGNOSIS — N2581 Secondary hyperparathyroidism of renal origin: Secondary | ICD-10-CM | POA: Diagnosis not present

## 2016-01-09 DIAGNOSIS — E1151 Type 2 diabetes mellitus with diabetic peripheral angiopathy without gangrene: Secondary | ICD-10-CM | POA: Diagnosis not present

## 2016-01-09 DIAGNOSIS — D509 Iron deficiency anemia, unspecified: Secondary | ICD-10-CM | POA: Diagnosis not present

## 2016-01-09 DIAGNOSIS — N186 End stage renal disease: Secondary | ICD-10-CM | POA: Diagnosis not present

## 2016-01-10 DIAGNOSIS — D508 Other iron deficiency anemias: Secondary | ICD-10-CM | POA: Diagnosis not present

## 2016-01-10 DIAGNOSIS — N186 End stage renal disease: Secondary | ICD-10-CM | POA: Diagnosis not present

## 2016-01-10 DIAGNOSIS — E1151 Type 2 diabetes mellitus with diabetic peripheral angiopathy without gangrene: Secondary | ICD-10-CM | POA: Diagnosis not present

## 2016-01-10 DIAGNOSIS — N2581 Secondary hyperparathyroidism of renal origin: Secondary | ICD-10-CM | POA: Diagnosis not present

## 2016-01-10 DIAGNOSIS — D631 Anemia in chronic kidney disease: Secondary | ICD-10-CM | POA: Diagnosis not present

## 2016-01-10 DIAGNOSIS — D509 Iron deficiency anemia, unspecified: Secondary | ICD-10-CM | POA: Diagnosis not present

## 2016-01-11 DIAGNOSIS — E1151 Type 2 diabetes mellitus with diabetic peripheral angiopathy without gangrene: Secondary | ICD-10-CM | POA: Diagnosis not present

## 2016-01-11 DIAGNOSIS — N2581 Secondary hyperparathyroidism of renal origin: Secondary | ICD-10-CM | POA: Diagnosis not present

## 2016-01-11 DIAGNOSIS — D509 Iron deficiency anemia, unspecified: Secondary | ICD-10-CM | POA: Diagnosis not present

## 2016-01-11 DIAGNOSIS — D508 Other iron deficiency anemias: Secondary | ICD-10-CM | POA: Diagnosis not present

## 2016-01-11 DIAGNOSIS — N186 End stage renal disease: Secondary | ICD-10-CM | POA: Diagnosis not present

## 2016-01-11 DIAGNOSIS — D631 Anemia in chronic kidney disease: Secondary | ICD-10-CM | POA: Diagnosis not present

## 2016-01-13 DIAGNOSIS — N186 End stage renal disease: Secondary | ICD-10-CM | POA: Diagnosis not present

## 2016-01-13 DIAGNOSIS — D631 Anemia in chronic kidney disease: Secondary | ICD-10-CM | POA: Diagnosis not present

## 2016-01-13 DIAGNOSIS — N2581 Secondary hyperparathyroidism of renal origin: Secondary | ICD-10-CM | POA: Diagnosis not present

## 2016-01-13 DIAGNOSIS — D509 Iron deficiency anemia, unspecified: Secondary | ICD-10-CM | POA: Diagnosis not present

## 2016-01-13 DIAGNOSIS — D508 Other iron deficiency anemias: Secondary | ICD-10-CM | POA: Diagnosis not present

## 2016-01-13 DIAGNOSIS — E1151 Type 2 diabetes mellitus with diabetic peripheral angiopathy without gangrene: Secondary | ICD-10-CM | POA: Diagnosis not present

## 2016-01-16 DIAGNOSIS — D508 Other iron deficiency anemias: Secondary | ICD-10-CM | POA: Diagnosis not present

## 2016-01-16 DIAGNOSIS — N186 End stage renal disease: Secondary | ICD-10-CM | POA: Diagnosis not present

## 2016-01-16 DIAGNOSIS — D631 Anemia in chronic kidney disease: Secondary | ICD-10-CM | POA: Diagnosis not present

## 2016-01-16 DIAGNOSIS — N2581 Secondary hyperparathyroidism of renal origin: Secondary | ICD-10-CM | POA: Diagnosis not present

## 2016-01-16 DIAGNOSIS — E1151 Type 2 diabetes mellitus with diabetic peripheral angiopathy without gangrene: Secondary | ICD-10-CM | POA: Diagnosis not present

## 2016-01-16 DIAGNOSIS — D509 Iron deficiency anemia, unspecified: Secondary | ICD-10-CM | POA: Diagnosis not present

## 2016-01-18 DIAGNOSIS — N2581 Secondary hyperparathyroidism of renal origin: Secondary | ICD-10-CM | POA: Diagnosis not present

## 2016-01-18 DIAGNOSIS — E1151 Type 2 diabetes mellitus with diabetic peripheral angiopathy without gangrene: Secondary | ICD-10-CM | POA: Diagnosis not present

## 2016-01-18 DIAGNOSIS — D508 Other iron deficiency anemias: Secondary | ICD-10-CM | POA: Diagnosis not present

## 2016-01-18 DIAGNOSIS — N186 End stage renal disease: Secondary | ICD-10-CM | POA: Diagnosis not present

## 2016-01-18 DIAGNOSIS — D509 Iron deficiency anemia, unspecified: Secondary | ICD-10-CM | POA: Diagnosis not present

## 2016-01-18 DIAGNOSIS — D631 Anemia in chronic kidney disease: Secondary | ICD-10-CM | POA: Diagnosis not present

## 2016-01-19 DIAGNOSIS — E119 Type 2 diabetes mellitus without complications: Secondary | ICD-10-CM | POA: Diagnosis not present

## 2016-01-19 DIAGNOSIS — Z7682 Awaiting organ transplant status: Secondary | ICD-10-CM | POA: Diagnosis not present

## 2016-01-19 DIAGNOSIS — Z1159 Encounter for screening for other viral diseases: Secondary | ICD-10-CM | POA: Diagnosis not present

## 2016-01-19 DIAGNOSIS — Z114 Encounter for screening for human immunodeficiency virus [HIV]: Secondary | ICD-10-CM | POA: Diagnosis not present

## 2016-01-19 DIAGNOSIS — N186 End stage renal disease: Secondary | ICD-10-CM | POA: Diagnosis not present

## 2016-01-19 DIAGNOSIS — Z01818 Encounter for other preprocedural examination: Secondary | ICD-10-CM | POA: Diagnosis not present

## 2016-01-19 DIAGNOSIS — Z992 Dependence on renal dialysis: Secondary | ICD-10-CM | POA: Diagnosis not present

## 2016-01-19 DIAGNOSIS — Z125 Encounter for screening for malignant neoplasm of prostate: Secondary | ICD-10-CM | POA: Diagnosis not present

## 2016-01-20 DIAGNOSIS — D509 Iron deficiency anemia, unspecified: Secondary | ICD-10-CM | POA: Diagnosis not present

## 2016-01-20 DIAGNOSIS — D508 Other iron deficiency anemias: Secondary | ICD-10-CM | POA: Diagnosis not present

## 2016-01-20 DIAGNOSIS — E1151 Type 2 diabetes mellitus with diabetic peripheral angiopathy without gangrene: Secondary | ICD-10-CM | POA: Diagnosis not present

## 2016-01-20 DIAGNOSIS — D631 Anemia in chronic kidney disease: Secondary | ICD-10-CM | POA: Diagnosis not present

## 2016-01-20 DIAGNOSIS — N186 End stage renal disease: Secondary | ICD-10-CM | POA: Diagnosis not present

## 2016-01-20 DIAGNOSIS — N2581 Secondary hyperparathyroidism of renal origin: Secondary | ICD-10-CM | POA: Diagnosis not present

## 2016-01-23 DIAGNOSIS — D631 Anemia in chronic kidney disease: Secondary | ICD-10-CM | POA: Diagnosis not present

## 2016-01-23 DIAGNOSIS — E1151 Type 2 diabetes mellitus with diabetic peripheral angiopathy without gangrene: Secondary | ICD-10-CM | POA: Diagnosis not present

## 2016-01-23 DIAGNOSIS — N186 End stage renal disease: Secondary | ICD-10-CM | POA: Diagnosis not present

## 2016-01-23 DIAGNOSIS — D508 Other iron deficiency anemias: Secondary | ICD-10-CM | POA: Diagnosis not present

## 2016-01-23 DIAGNOSIS — D509 Iron deficiency anemia, unspecified: Secondary | ICD-10-CM | POA: Diagnosis not present

## 2016-01-23 DIAGNOSIS — N2581 Secondary hyperparathyroidism of renal origin: Secondary | ICD-10-CM | POA: Diagnosis not present

## 2016-01-24 DIAGNOSIS — D508 Other iron deficiency anemias: Secondary | ICD-10-CM | POA: Diagnosis not present

## 2016-01-24 DIAGNOSIS — D509 Iron deficiency anemia, unspecified: Secondary | ICD-10-CM | POA: Diagnosis not present

## 2016-01-24 DIAGNOSIS — D631 Anemia in chronic kidney disease: Secondary | ICD-10-CM | POA: Diagnosis not present

## 2016-01-24 DIAGNOSIS — E1151 Type 2 diabetes mellitus with diabetic peripheral angiopathy without gangrene: Secondary | ICD-10-CM | POA: Diagnosis not present

## 2016-01-24 DIAGNOSIS — N186 End stage renal disease: Secondary | ICD-10-CM | POA: Diagnosis not present

## 2016-01-24 DIAGNOSIS — N2581 Secondary hyperparathyroidism of renal origin: Secondary | ICD-10-CM | POA: Diagnosis not present

## 2016-01-25 DIAGNOSIS — T8620 Unspecified complication of heart transplant: Secondary | ICD-10-CM | POA: Diagnosis not present

## 2016-01-25 DIAGNOSIS — N186 End stage renal disease: Secondary | ICD-10-CM | POA: Diagnosis not present

## 2016-01-25 DIAGNOSIS — N2581 Secondary hyperparathyroidism of renal origin: Secondary | ICD-10-CM | POA: Diagnosis not present

## 2016-01-25 DIAGNOSIS — Z992 Dependence on renal dialysis: Secondary | ICD-10-CM | POA: Diagnosis not present

## 2016-01-25 DIAGNOSIS — D631 Anemia in chronic kidney disease: Secondary | ICD-10-CM | POA: Diagnosis not present

## 2016-01-25 DIAGNOSIS — D508 Other iron deficiency anemias: Secondary | ICD-10-CM | POA: Diagnosis not present

## 2016-01-25 DIAGNOSIS — E1151 Type 2 diabetes mellitus with diabetic peripheral angiopathy without gangrene: Secondary | ICD-10-CM | POA: Diagnosis not present

## 2016-01-25 DIAGNOSIS — D509 Iron deficiency anemia, unspecified: Secondary | ICD-10-CM | POA: Diagnosis not present

## 2016-01-27 DIAGNOSIS — D509 Iron deficiency anemia, unspecified: Secondary | ICD-10-CM | POA: Diagnosis not present

## 2016-01-27 DIAGNOSIS — E1151 Type 2 diabetes mellitus with diabetic peripheral angiopathy without gangrene: Secondary | ICD-10-CM | POA: Diagnosis not present

## 2016-01-27 DIAGNOSIS — N186 End stage renal disease: Secondary | ICD-10-CM | POA: Diagnosis not present

## 2016-01-27 DIAGNOSIS — N2581 Secondary hyperparathyroidism of renal origin: Secondary | ICD-10-CM | POA: Diagnosis not present

## 2016-01-30 DIAGNOSIS — D509 Iron deficiency anemia, unspecified: Secondary | ICD-10-CM | POA: Diagnosis not present

## 2016-01-30 DIAGNOSIS — N186 End stage renal disease: Secondary | ICD-10-CM | POA: Diagnosis not present

## 2016-01-30 DIAGNOSIS — N2581 Secondary hyperparathyroidism of renal origin: Secondary | ICD-10-CM | POA: Diagnosis not present

## 2016-01-30 DIAGNOSIS — E1151 Type 2 diabetes mellitus with diabetic peripheral angiopathy without gangrene: Secondary | ICD-10-CM | POA: Diagnosis not present

## 2016-02-01 DIAGNOSIS — D509 Iron deficiency anemia, unspecified: Secondary | ICD-10-CM | POA: Diagnosis not present

## 2016-02-01 DIAGNOSIS — E1151 Type 2 diabetes mellitus with diabetic peripheral angiopathy without gangrene: Secondary | ICD-10-CM | POA: Diagnosis not present

## 2016-02-01 DIAGNOSIS — N2581 Secondary hyperparathyroidism of renal origin: Secondary | ICD-10-CM | POA: Diagnosis not present

## 2016-02-01 DIAGNOSIS — N186 End stage renal disease: Secondary | ICD-10-CM | POA: Diagnosis not present

## 2016-02-01 DIAGNOSIS — Z125 Encounter for screening for malignant neoplasm of prostate: Secondary | ICD-10-CM | POA: Diagnosis not present

## 2016-02-03 DIAGNOSIS — E1151 Type 2 diabetes mellitus with diabetic peripheral angiopathy without gangrene: Secondary | ICD-10-CM | POA: Diagnosis not present

## 2016-02-03 DIAGNOSIS — N2581 Secondary hyperparathyroidism of renal origin: Secondary | ICD-10-CM | POA: Diagnosis not present

## 2016-02-03 DIAGNOSIS — N186 End stage renal disease: Secondary | ICD-10-CM | POA: Diagnosis not present

## 2016-02-03 DIAGNOSIS — D509 Iron deficiency anemia, unspecified: Secondary | ICD-10-CM | POA: Diagnosis not present

## 2016-02-06 DIAGNOSIS — N186 End stage renal disease: Secondary | ICD-10-CM | POA: Diagnosis not present

## 2016-02-06 DIAGNOSIS — D509 Iron deficiency anemia, unspecified: Secondary | ICD-10-CM | POA: Diagnosis not present

## 2016-02-06 DIAGNOSIS — N2581 Secondary hyperparathyroidism of renal origin: Secondary | ICD-10-CM | POA: Diagnosis not present

## 2016-02-06 DIAGNOSIS — E1151 Type 2 diabetes mellitus with diabetic peripheral angiopathy without gangrene: Secondary | ICD-10-CM | POA: Diagnosis not present

## 2016-02-08 DIAGNOSIS — N2581 Secondary hyperparathyroidism of renal origin: Secondary | ICD-10-CM | POA: Diagnosis not present

## 2016-02-08 DIAGNOSIS — D509 Iron deficiency anemia, unspecified: Secondary | ICD-10-CM | POA: Diagnosis not present

## 2016-02-08 DIAGNOSIS — N186 End stage renal disease: Secondary | ICD-10-CM | POA: Diagnosis not present

## 2016-02-08 DIAGNOSIS — E1151 Type 2 diabetes mellitus with diabetic peripheral angiopathy without gangrene: Secondary | ICD-10-CM | POA: Diagnosis not present

## 2016-02-10 DIAGNOSIS — D509 Iron deficiency anemia, unspecified: Secondary | ICD-10-CM | POA: Diagnosis not present

## 2016-02-10 DIAGNOSIS — N2581 Secondary hyperparathyroidism of renal origin: Secondary | ICD-10-CM | POA: Diagnosis not present

## 2016-02-10 DIAGNOSIS — N186 End stage renal disease: Secondary | ICD-10-CM | POA: Diagnosis not present

## 2016-02-10 DIAGNOSIS — E1151 Type 2 diabetes mellitus with diabetic peripheral angiopathy without gangrene: Secondary | ICD-10-CM | POA: Diagnosis not present

## 2016-02-13 ENCOUNTER — Telehealth: Payer: Self-pay | Admitting: Gastroenterology

## 2016-02-13 DIAGNOSIS — E1151 Type 2 diabetes mellitus with diabetic peripheral angiopathy without gangrene: Secondary | ICD-10-CM | POA: Diagnosis not present

## 2016-02-13 DIAGNOSIS — D509 Iron deficiency anemia, unspecified: Secondary | ICD-10-CM | POA: Diagnosis not present

## 2016-02-13 DIAGNOSIS — N186 End stage renal disease: Secondary | ICD-10-CM | POA: Diagnosis not present

## 2016-02-13 DIAGNOSIS — N2581 Secondary hyperparathyroidism of renal origin: Secondary | ICD-10-CM | POA: Diagnosis not present

## 2016-02-13 NOTE — Telephone Encounter (Signed)
-----   Message -----    From: Jerene Bears, MD    Sent: 02/13/2016   1:59 PM      To: Larina Bras, CMA Subject: FW: Bastion, Giere 1960-10-20                   Sounds like a Schooler pt He may have said he would not see her.  I cannot tell Sounds like she needs colon ASAP pre-transplant eval.  Any MD okay

## 2016-02-13 NOTE — Telephone Encounter (Signed)
Received referral from [facility] to schedule colonoscopy. Last colon at [facility] and GI records are with referral. Records have been placed on Dr. Lynne Leader desk for review.

## 2016-02-14 DIAGNOSIS — Z6841 Body Mass Index (BMI) 40.0 and over, adult: Secondary | ICD-10-CM | POA: Diagnosis not present

## 2016-02-14 DIAGNOSIS — Z713 Dietary counseling and surveillance: Secondary | ICD-10-CM | POA: Diagnosis not present

## 2016-02-14 DIAGNOSIS — F4320 Adjustment disorder, unspecified: Secondary | ICD-10-CM | POA: Diagnosis not present

## 2016-02-14 DIAGNOSIS — Z7982 Long term (current) use of aspirin: Secondary | ICD-10-CM | POA: Diagnosis not present

## 2016-02-14 DIAGNOSIS — Z79899 Other long term (current) drug therapy: Secondary | ICD-10-CM | POA: Diagnosis not present

## 2016-02-14 DIAGNOSIS — E119 Type 2 diabetes mellitus without complications: Secondary | ICD-10-CM | POA: Diagnosis not present

## 2016-02-14 NOTE — Telephone Encounter (Signed)
Dr. Fuller Plan is Doc of the Day. Dr. Fuller Plan reviewed records and has declined to accept patient. Dr. Fuller Plan states that patient is awaiting heart transplant and he recommends colonoscopy at [facility] by their GI MD Dr. Gordy Levan. I left a message for patient to return my call.

## 2016-02-15 DIAGNOSIS — N2581 Secondary hyperparathyroidism of renal origin: Secondary | ICD-10-CM | POA: Diagnosis not present

## 2016-02-15 DIAGNOSIS — E1151 Type 2 diabetes mellitus with diabetic peripheral angiopathy without gangrene: Secondary | ICD-10-CM | POA: Diagnosis not present

## 2016-02-15 DIAGNOSIS — N186 End stage renal disease: Secondary | ICD-10-CM | POA: Diagnosis not present

## 2016-02-15 DIAGNOSIS — D509 Iron deficiency anemia, unspecified: Secondary | ICD-10-CM | POA: Diagnosis not present

## 2016-02-17 DIAGNOSIS — N2581 Secondary hyperparathyroidism of renal origin: Secondary | ICD-10-CM | POA: Diagnosis not present

## 2016-02-17 DIAGNOSIS — E1151 Type 2 diabetes mellitus with diabetic peripheral angiopathy without gangrene: Secondary | ICD-10-CM | POA: Diagnosis not present

## 2016-02-17 DIAGNOSIS — N186 End stage renal disease: Secondary | ICD-10-CM | POA: Diagnosis not present

## 2016-02-17 DIAGNOSIS — D509 Iron deficiency anemia, unspecified: Secondary | ICD-10-CM | POA: Diagnosis not present

## 2016-02-20 DIAGNOSIS — D509 Iron deficiency anemia, unspecified: Secondary | ICD-10-CM | POA: Diagnosis not present

## 2016-02-20 DIAGNOSIS — N2581 Secondary hyperparathyroidism of renal origin: Secondary | ICD-10-CM | POA: Diagnosis not present

## 2016-02-20 DIAGNOSIS — E1151 Type 2 diabetes mellitus with diabetic peripheral angiopathy without gangrene: Secondary | ICD-10-CM | POA: Diagnosis not present

## 2016-02-20 DIAGNOSIS — N186 End stage renal disease: Secondary | ICD-10-CM | POA: Diagnosis not present

## 2016-02-22 DIAGNOSIS — D509 Iron deficiency anemia, unspecified: Secondary | ICD-10-CM | POA: Diagnosis not present

## 2016-02-22 DIAGNOSIS — N186 End stage renal disease: Secondary | ICD-10-CM | POA: Diagnosis not present

## 2016-02-22 DIAGNOSIS — E1151 Type 2 diabetes mellitus with diabetic peripheral angiopathy without gangrene: Secondary | ICD-10-CM | POA: Diagnosis not present

## 2016-02-22 DIAGNOSIS — N2581 Secondary hyperparathyroidism of renal origin: Secondary | ICD-10-CM | POA: Diagnosis not present

## 2016-02-23 ENCOUNTER — Telehealth: Payer: Self-pay | Admitting: Gastroenterology

## 2016-02-24 DIAGNOSIS — E1151 Type 2 diabetes mellitus with diabetic peripheral angiopathy without gangrene: Secondary | ICD-10-CM | POA: Diagnosis not present

## 2016-02-24 DIAGNOSIS — N2581 Secondary hyperparathyroidism of renal origin: Secondary | ICD-10-CM | POA: Diagnosis not present

## 2016-02-24 DIAGNOSIS — T8620 Unspecified complication of heart transplant: Secondary | ICD-10-CM | POA: Diagnosis not present

## 2016-02-24 DIAGNOSIS — N186 End stage renal disease: Secondary | ICD-10-CM | POA: Diagnosis not present

## 2016-02-24 DIAGNOSIS — D509 Iron deficiency anemia, unspecified: Secondary | ICD-10-CM | POA: Diagnosis not present

## 2016-02-24 DIAGNOSIS — Z992 Dependence on renal dialysis: Secondary | ICD-10-CM | POA: Diagnosis not present

## 2016-02-27 ENCOUNTER — Encounter: Payer: Self-pay | Admitting: Gastroenterology

## 2016-02-27 DIAGNOSIS — D509 Iron deficiency anemia, unspecified: Secondary | ICD-10-CM | POA: Diagnosis not present

## 2016-02-27 DIAGNOSIS — N186 End stage renal disease: Secondary | ICD-10-CM | POA: Diagnosis not present

## 2016-02-27 DIAGNOSIS — D631 Anemia in chronic kidney disease: Secondary | ICD-10-CM | POA: Diagnosis not present

## 2016-02-27 DIAGNOSIS — N2581 Secondary hyperparathyroidism of renal origin: Secondary | ICD-10-CM | POA: Diagnosis not present

## 2016-02-27 DIAGNOSIS — E1151 Type 2 diabetes mellitus with diabetic peripheral angiopathy without gangrene: Secondary | ICD-10-CM | POA: Diagnosis not present

## 2016-02-27 NOTE — Telephone Encounter (Signed)
Dr. Loletha Carrow has reviewed records and has agreed to see patient in an office visit first.

## 2016-02-29 DIAGNOSIS — N2581 Secondary hyperparathyroidism of renal origin: Secondary | ICD-10-CM | POA: Diagnosis not present

## 2016-02-29 DIAGNOSIS — E1151 Type 2 diabetes mellitus with diabetic peripheral angiopathy without gangrene: Secondary | ICD-10-CM | POA: Diagnosis not present

## 2016-02-29 DIAGNOSIS — D509 Iron deficiency anemia, unspecified: Secondary | ICD-10-CM | POA: Diagnosis not present

## 2016-02-29 DIAGNOSIS — N186 End stage renal disease: Secondary | ICD-10-CM | POA: Diagnosis not present

## 2016-02-29 DIAGNOSIS — D631 Anemia in chronic kidney disease: Secondary | ICD-10-CM | POA: Diagnosis not present

## 2016-03-02 DIAGNOSIS — E1151 Type 2 diabetes mellitus with diabetic peripheral angiopathy without gangrene: Secondary | ICD-10-CM | POA: Diagnosis not present

## 2016-03-02 DIAGNOSIS — D631 Anemia in chronic kidney disease: Secondary | ICD-10-CM | POA: Diagnosis not present

## 2016-03-02 DIAGNOSIS — D509 Iron deficiency anemia, unspecified: Secondary | ICD-10-CM | POA: Diagnosis not present

## 2016-03-02 DIAGNOSIS — N186 End stage renal disease: Secondary | ICD-10-CM | POA: Diagnosis not present

## 2016-03-02 DIAGNOSIS — N2581 Secondary hyperparathyroidism of renal origin: Secondary | ICD-10-CM | POA: Diagnosis not present

## 2016-03-05 DIAGNOSIS — E1151 Type 2 diabetes mellitus with diabetic peripheral angiopathy without gangrene: Secondary | ICD-10-CM | POA: Diagnosis not present

## 2016-03-05 DIAGNOSIS — D509 Iron deficiency anemia, unspecified: Secondary | ICD-10-CM | POA: Diagnosis not present

## 2016-03-05 DIAGNOSIS — D631 Anemia in chronic kidney disease: Secondary | ICD-10-CM | POA: Diagnosis not present

## 2016-03-05 DIAGNOSIS — N2581 Secondary hyperparathyroidism of renal origin: Secondary | ICD-10-CM | POA: Diagnosis not present

## 2016-03-05 DIAGNOSIS — N186 End stage renal disease: Secondary | ICD-10-CM | POA: Diagnosis not present

## 2016-03-07 DIAGNOSIS — D631 Anemia in chronic kidney disease: Secondary | ICD-10-CM | POA: Diagnosis not present

## 2016-03-07 DIAGNOSIS — D509 Iron deficiency anemia, unspecified: Secondary | ICD-10-CM | POA: Diagnosis not present

## 2016-03-07 DIAGNOSIS — E1151 Type 2 diabetes mellitus with diabetic peripheral angiopathy without gangrene: Secondary | ICD-10-CM | POA: Diagnosis not present

## 2016-03-07 DIAGNOSIS — N2581 Secondary hyperparathyroidism of renal origin: Secondary | ICD-10-CM | POA: Diagnosis not present

## 2016-03-07 DIAGNOSIS — N186 End stage renal disease: Secondary | ICD-10-CM | POA: Diagnosis not present

## 2016-03-09 DIAGNOSIS — N186 End stage renal disease: Secondary | ICD-10-CM | POA: Diagnosis not present

## 2016-03-09 DIAGNOSIS — D509 Iron deficiency anemia, unspecified: Secondary | ICD-10-CM | POA: Diagnosis not present

## 2016-03-09 DIAGNOSIS — D631 Anemia in chronic kidney disease: Secondary | ICD-10-CM | POA: Diagnosis not present

## 2016-03-09 DIAGNOSIS — N2581 Secondary hyperparathyroidism of renal origin: Secondary | ICD-10-CM | POA: Diagnosis not present

## 2016-03-09 DIAGNOSIS — E1151 Type 2 diabetes mellitus with diabetic peripheral angiopathy without gangrene: Secondary | ICD-10-CM | POA: Diagnosis not present

## 2016-03-12 DIAGNOSIS — E1151 Type 2 diabetes mellitus with diabetic peripheral angiopathy without gangrene: Secondary | ICD-10-CM | POA: Diagnosis not present

## 2016-03-12 DIAGNOSIS — N2581 Secondary hyperparathyroidism of renal origin: Secondary | ICD-10-CM | POA: Diagnosis not present

## 2016-03-12 DIAGNOSIS — N186 End stage renal disease: Secondary | ICD-10-CM | POA: Diagnosis not present

## 2016-03-12 DIAGNOSIS — D631 Anemia in chronic kidney disease: Secondary | ICD-10-CM | POA: Diagnosis not present

## 2016-03-12 DIAGNOSIS — D509 Iron deficiency anemia, unspecified: Secondary | ICD-10-CM | POA: Diagnosis not present

## 2016-03-14 DIAGNOSIS — N2581 Secondary hyperparathyroidism of renal origin: Secondary | ICD-10-CM | POA: Diagnosis not present

## 2016-03-14 DIAGNOSIS — N186 End stage renal disease: Secondary | ICD-10-CM | POA: Diagnosis not present

## 2016-03-14 DIAGNOSIS — D509 Iron deficiency anemia, unspecified: Secondary | ICD-10-CM | POA: Diagnosis not present

## 2016-03-14 DIAGNOSIS — D631 Anemia in chronic kidney disease: Secondary | ICD-10-CM | POA: Diagnosis not present

## 2016-03-14 DIAGNOSIS — E1151 Type 2 diabetes mellitus with diabetic peripheral angiopathy without gangrene: Secondary | ICD-10-CM | POA: Diagnosis not present

## 2016-03-16 DIAGNOSIS — N186 End stage renal disease: Secondary | ICD-10-CM | POA: Diagnosis not present

## 2016-03-16 DIAGNOSIS — E1151 Type 2 diabetes mellitus with diabetic peripheral angiopathy without gangrene: Secondary | ICD-10-CM | POA: Diagnosis not present

## 2016-03-16 DIAGNOSIS — D631 Anemia in chronic kidney disease: Secondary | ICD-10-CM | POA: Diagnosis not present

## 2016-03-16 DIAGNOSIS — D509 Iron deficiency anemia, unspecified: Secondary | ICD-10-CM | POA: Diagnosis not present

## 2016-03-16 DIAGNOSIS — N2581 Secondary hyperparathyroidism of renal origin: Secondary | ICD-10-CM | POA: Diagnosis not present

## 2016-03-19 DIAGNOSIS — D509 Iron deficiency anemia, unspecified: Secondary | ICD-10-CM | POA: Diagnosis not present

## 2016-03-19 DIAGNOSIS — D631 Anemia in chronic kidney disease: Secondary | ICD-10-CM | POA: Diagnosis not present

## 2016-03-19 DIAGNOSIS — N186 End stage renal disease: Secondary | ICD-10-CM | POA: Diagnosis not present

## 2016-03-19 DIAGNOSIS — E1151 Type 2 diabetes mellitus with diabetic peripheral angiopathy without gangrene: Secondary | ICD-10-CM | POA: Diagnosis not present

## 2016-03-19 DIAGNOSIS — N2581 Secondary hyperparathyroidism of renal origin: Secondary | ICD-10-CM | POA: Diagnosis not present

## 2016-03-21 DIAGNOSIS — N2581 Secondary hyperparathyroidism of renal origin: Secondary | ICD-10-CM | POA: Diagnosis not present

## 2016-03-21 DIAGNOSIS — D631 Anemia in chronic kidney disease: Secondary | ICD-10-CM | POA: Diagnosis not present

## 2016-03-21 DIAGNOSIS — D509 Iron deficiency anemia, unspecified: Secondary | ICD-10-CM | POA: Diagnosis not present

## 2016-03-21 DIAGNOSIS — E1151 Type 2 diabetes mellitus with diabetic peripheral angiopathy without gangrene: Secondary | ICD-10-CM | POA: Diagnosis not present

## 2016-03-21 DIAGNOSIS — N186 End stage renal disease: Secondary | ICD-10-CM | POA: Diagnosis not present

## 2016-03-23 DIAGNOSIS — D509 Iron deficiency anemia, unspecified: Secondary | ICD-10-CM | POA: Diagnosis not present

## 2016-03-23 DIAGNOSIS — N186 End stage renal disease: Secondary | ICD-10-CM | POA: Diagnosis not present

## 2016-03-23 DIAGNOSIS — D631 Anemia in chronic kidney disease: Secondary | ICD-10-CM | POA: Diagnosis not present

## 2016-03-23 DIAGNOSIS — E1151 Type 2 diabetes mellitus with diabetic peripheral angiopathy without gangrene: Secondary | ICD-10-CM | POA: Diagnosis not present

## 2016-03-23 DIAGNOSIS — N2581 Secondary hyperparathyroidism of renal origin: Secondary | ICD-10-CM | POA: Diagnosis not present

## 2016-03-26 DIAGNOSIS — Z992 Dependence on renal dialysis: Secondary | ICD-10-CM | POA: Diagnosis not present

## 2016-03-26 DIAGNOSIS — T8620 Unspecified complication of heart transplant: Secondary | ICD-10-CM | POA: Diagnosis not present

## 2016-03-26 DIAGNOSIS — D509 Iron deficiency anemia, unspecified: Secondary | ICD-10-CM | POA: Diagnosis not present

## 2016-03-26 DIAGNOSIS — D631 Anemia in chronic kidney disease: Secondary | ICD-10-CM | POA: Diagnosis not present

## 2016-03-26 DIAGNOSIS — E1151 Type 2 diabetes mellitus with diabetic peripheral angiopathy without gangrene: Secondary | ICD-10-CM | POA: Diagnosis not present

## 2016-03-26 DIAGNOSIS — N2581 Secondary hyperparathyroidism of renal origin: Secondary | ICD-10-CM | POA: Diagnosis not present

## 2016-03-26 DIAGNOSIS — N186 End stage renal disease: Secondary | ICD-10-CM | POA: Diagnosis not present

## 2016-03-28 DIAGNOSIS — E1151 Type 2 diabetes mellitus with diabetic peripheral angiopathy without gangrene: Secondary | ICD-10-CM | POA: Diagnosis not present

## 2016-03-28 DIAGNOSIS — N2581 Secondary hyperparathyroidism of renal origin: Secondary | ICD-10-CM | POA: Diagnosis not present

## 2016-03-28 DIAGNOSIS — D631 Anemia in chronic kidney disease: Secondary | ICD-10-CM | POA: Diagnosis not present

## 2016-03-28 DIAGNOSIS — N186 End stage renal disease: Secondary | ICD-10-CM | POA: Diagnosis not present

## 2016-03-29 DIAGNOSIS — N186 End stage renal disease: Secondary | ICD-10-CM | POA: Diagnosis not present

## 2016-03-29 DIAGNOSIS — Z7682 Awaiting organ transplant status: Secondary | ICD-10-CM | POA: Diagnosis not present

## 2016-03-29 DIAGNOSIS — Z941 Heart transplant status: Secondary | ICD-10-CM | POA: Diagnosis not present

## 2016-03-29 DIAGNOSIS — I340 Nonrheumatic mitral (valve) insufficiency: Secondary | ICD-10-CM | POA: Diagnosis not present

## 2016-03-29 DIAGNOSIS — I371 Nonrheumatic pulmonary valve insufficiency: Secondary | ICD-10-CM | POA: Diagnosis not present

## 2016-03-29 DIAGNOSIS — Z48298 Encounter for aftercare following other organ transplant: Secondary | ICD-10-CM | POA: Diagnosis not present

## 2016-03-29 DIAGNOSIS — E1122 Type 2 diabetes mellitus with diabetic chronic kidney disease: Secondary | ICD-10-CM | POA: Diagnosis not present

## 2016-03-29 DIAGNOSIS — Z125 Encounter for screening for malignant neoplasm of prostate: Secondary | ICD-10-CM | POA: Diagnosis not present

## 2016-03-29 DIAGNOSIS — Z79899 Other long term (current) drug therapy: Secondary | ICD-10-CM | POA: Diagnosis not present

## 2016-03-29 DIAGNOSIS — Z992 Dependence on renal dialysis: Secondary | ICD-10-CM | POA: Diagnosis not present

## 2016-03-29 DIAGNOSIS — Z7982 Long term (current) use of aspirin: Secondary | ICD-10-CM | POA: Diagnosis not present

## 2016-03-29 DIAGNOSIS — I120 Hypertensive chronic kidney disease with stage 5 chronic kidney disease or end stage renal disease: Secondary | ICD-10-CM | POA: Diagnosis not present

## 2016-03-29 DIAGNOSIS — Z4821 Encounter for aftercare following heart transplant: Secondary | ICD-10-CM | POA: Diagnosis not present

## 2016-03-30 DIAGNOSIS — E1151 Type 2 diabetes mellitus with diabetic peripheral angiopathy without gangrene: Secondary | ICD-10-CM | POA: Diagnosis not present

## 2016-03-30 DIAGNOSIS — N186 End stage renal disease: Secondary | ICD-10-CM | POA: Diagnosis not present

## 2016-03-30 DIAGNOSIS — D631 Anemia in chronic kidney disease: Secondary | ICD-10-CM | POA: Diagnosis not present

## 2016-03-30 DIAGNOSIS — N2581 Secondary hyperparathyroidism of renal origin: Secondary | ICD-10-CM | POA: Diagnosis not present

## 2016-04-02 DIAGNOSIS — N186 End stage renal disease: Secondary | ICD-10-CM | POA: Diagnosis not present

## 2016-04-02 DIAGNOSIS — D631 Anemia in chronic kidney disease: Secondary | ICD-10-CM | POA: Diagnosis not present

## 2016-04-02 DIAGNOSIS — N2581 Secondary hyperparathyroidism of renal origin: Secondary | ICD-10-CM | POA: Diagnosis not present

## 2016-04-02 DIAGNOSIS — E1151 Type 2 diabetes mellitus with diabetic peripheral angiopathy without gangrene: Secondary | ICD-10-CM | POA: Diagnosis not present

## 2016-04-04 DIAGNOSIS — R1030 Lower abdominal pain, unspecified: Secondary | ICD-10-CM | POA: Diagnosis not present

## 2016-04-04 DIAGNOSIS — N186 End stage renal disease: Secondary | ICD-10-CM | POA: Diagnosis not present

## 2016-04-04 DIAGNOSIS — N2581 Secondary hyperparathyroidism of renal origin: Secondary | ICD-10-CM | POA: Diagnosis not present

## 2016-04-04 DIAGNOSIS — D631 Anemia in chronic kidney disease: Secondary | ICD-10-CM | POA: Diagnosis not present

## 2016-04-04 DIAGNOSIS — Z7984 Long term (current) use of oral hypoglycemic drugs: Secondary | ICD-10-CM | POA: Diagnosis not present

## 2016-04-04 DIAGNOSIS — E1142 Type 2 diabetes mellitus with diabetic polyneuropathy: Secondary | ICD-10-CM | POA: Diagnosis not present

## 2016-04-04 DIAGNOSIS — E1151 Type 2 diabetes mellitus with diabetic peripheral angiopathy without gangrene: Secondary | ICD-10-CM | POA: Diagnosis not present

## 2016-04-05 ENCOUNTER — Ambulatory Visit
Admission: RE | Admit: 2016-04-05 | Discharge: 2016-04-05 | Disposition: A | Discharge status: Home / Self Care | Payer: Medicare Other | Source: Ambulatory Visit | Attending: Family Medicine | Admitting: Family Medicine

## 2016-04-05 ENCOUNTER — Other Ambulatory Visit: Payer: Self-pay | Admitting: Family Medicine

## 2016-04-05 DIAGNOSIS — M545 Low back pain, unspecified: Secondary | ICD-10-CM

## 2016-04-05 DIAGNOSIS — G629 Polyneuropathy, unspecified: Secondary | ICD-10-CM | POA: Diagnosis not present

## 2016-04-05 DIAGNOSIS — E1122 Type 2 diabetes mellitus with diabetic chronic kidney disease: Secondary | ICD-10-CM | POA: Diagnosis not present

## 2016-04-05 DIAGNOSIS — N185 Chronic kidney disease, stage 5: Secondary | ICD-10-CM | POA: Diagnosis not present

## 2016-04-05 DIAGNOSIS — E782 Mixed hyperlipidemia: Secondary | ICD-10-CM | POA: Diagnosis not present

## 2016-04-05 DIAGNOSIS — R972 Elevated prostate specific antigen [PSA]: Secondary | ICD-10-CM | POA: Diagnosis not present

## 2016-04-05 DIAGNOSIS — I10 Essential (primary) hypertension: Secondary | ICD-10-CM | POA: Diagnosis not present

## 2016-04-06 DIAGNOSIS — N2581 Secondary hyperparathyroidism of renal origin: Secondary | ICD-10-CM | POA: Diagnosis not present

## 2016-04-06 DIAGNOSIS — N186 End stage renal disease: Secondary | ICD-10-CM | POA: Diagnosis not present

## 2016-04-06 DIAGNOSIS — E1151 Type 2 diabetes mellitus with diabetic peripheral angiopathy without gangrene: Secondary | ICD-10-CM | POA: Diagnosis not present

## 2016-04-06 DIAGNOSIS — D631 Anemia in chronic kidney disease: Secondary | ICD-10-CM | POA: Diagnosis not present

## 2016-04-09 DIAGNOSIS — N186 End stage renal disease: Secondary | ICD-10-CM | POA: Diagnosis not present

## 2016-04-09 DIAGNOSIS — N2581 Secondary hyperparathyroidism of renal origin: Secondary | ICD-10-CM | POA: Diagnosis not present

## 2016-04-09 DIAGNOSIS — E1151 Type 2 diabetes mellitus with diabetic peripheral angiopathy without gangrene: Secondary | ICD-10-CM | POA: Diagnosis not present

## 2016-04-09 DIAGNOSIS — D631 Anemia in chronic kidney disease: Secondary | ICD-10-CM | POA: Diagnosis not present

## 2016-04-11 DIAGNOSIS — E1151 Type 2 diabetes mellitus with diabetic peripheral angiopathy without gangrene: Secondary | ICD-10-CM | POA: Diagnosis not present

## 2016-04-11 DIAGNOSIS — N2581 Secondary hyperparathyroidism of renal origin: Secondary | ICD-10-CM | POA: Diagnosis not present

## 2016-04-11 DIAGNOSIS — N186 End stage renal disease: Secondary | ICD-10-CM | POA: Diagnosis not present

## 2016-04-11 DIAGNOSIS — D631 Anemia in chronic kidney disease: Secondary | ICD-10-CM | POA: Diagnosis not present

## 2016-04-13 DIAGNOSIS — D631 Anemia in chronic kidney disease: Secondary | ICD-10-CM | POA: Diagnosis not present

## 2016-04-13 DIAGNOSIS — E1151 Type 2 diabetes mellitus with diabetic peripheral angiopathy without gangrene: Secondary | ICD-10-CM | POA: Diagnosis not present

## 2016-04-13 DIAGNOSIS — N2581 Secondary hyperparathyroidism of renal origin: Secondary | ICD-10-CM | POA: Diagnosis not present

## 2016-04-13 DIAGNOSIS — N186 End stage renal disease: Secondary | ICD-10-CM | POA: Diagnosis not present

## 2016-04-16 DIAGNOSIS — D631 Anemia in chronic kidney disease: Secondary | ICD-10-CM | POA: Diagnosis not present

## 2016-04-16 DIAGNOSIS — N186 End stage renal disease: Secondary | ICD-10-CM | POA: Diagnosis not present

## 2016-04-16 DIAGNOSIS — N2581 Secondary hyperparathyroidism of renal origin: Secondary | ICD-10-CM | POA: Diagnosis not present

## 2016-04-16 DIAGNOSIS — E1151 Type 2 diabetes mellitus with diabetic peripheral angiopathy without gangrene: Secondary | ICD-10-CM | POA: Diagnosis not present

## 2016-04-18 DIAGNOSIS — E1151 Type 2 diabetes mellitus with diabetic peripheral angiopathy without gangrene: Secondary | ICD-10-CM | POA: Diagnosis not present

## 2016-04-18 DIAGNOSIS — D631 Anemia in chronic kidney disease: Secondary | ICD-10-CM | POA: Diagnosis not present

## 2016-04-18 DIAGNOSIS — N2581 Secondary hyperparathyroidism of renal origin: Secondary | ICD-10-CM | POA: Diagnosis not present

## 2016-04-18 DIAGNOSIS — N186 End stage renal disease: Secondary | ICD-10-CM | POA: Diagnosis not present

## 2016-04-20 DIAGNOSIS — E1151 Type 2 diabetes mellitus with diabetic peripheral angiopathy without gangrene: Secondary | ICD-10-CM | POA: Diagnosis not present

## 2016-04-20 DIAGNOSIS — D631 Anemia in chronic kidney disease: Secondary | ICD-10-CM | POA: Diagnosis not present

## 2016-04-20 DIAGNOSIS — N2581 Secondary hyperparathyroidism of renal origin: Secondary | ICD-10-CM | POA: Diagnosis not present

## 2016-04-20 DIAGNOSIS — N186 End stage renal disease: Secondary | ICD-10-CM | POA: Diagnosis not present

## 2016-04-23 DIAGNOSIS — E1151 Type 2 diabetes mellitus with diabetic peripheral angiopathy without gangrene: Secondary | ICD-10-CM | POA: Diagnosis not present

## 2016-04-23 DIAGNOSIS — N2581 Secondary hyperparathyroidism of renal origin: Secondary | ICD-10-CM | POA: Diagnosis not present

## 2016-04-23 DIAGNOSIS — N186 End stage renal disease: Secondary | ICD-10-CM | POA: Diagnosis not present

## 2016-04-23 DIAGNOSIS — D631 Anemia in chronic kidney disease: Secondary | ICD-10-CM | POA: Diagnosis not present

## 2016-04-25 DIAGNOSIS — E1151 Type 2 diabetes mellitus with diabetic peripheral angiopathy without gangrene: Secondary | ICD-10-CM | POA: Diagnosis not present

## 2016-04-25 DIAGNOSIS — D631 Anemia in chronic kidney disease: Secondary | ICD-10-CM | POA: Diagnosis not present

## 2016-04-25 DIAGNOSIS — N186 End stage renal disease: Secondary | ICD-10-CM | POA: Diagnosis not present

## 2016-04-25 DIAGNOSIS — N2581 Secondary hyperparathyroidism of renal origin: Secondary | ICD-10-CM | POA: Diagnosis not present

## 2016-04-26 DIAGNOSIS — T8620 Unspecified complication of heart transplant: Secondary | ICD-10-CM | POA: Diagnosis not present

## 2016-04-26 DIAGNOSIS — Z992 Dependence on renal dialysis: Secondary | ICD-10-CM | POA: Diagnosis not present

## 2016-04-26 DIAGNOSIS — N186 End stage renal disease: Secondary | ICD-10-CM | POA: Diagnosis not present

## 2016-04-27 DIAGNOSIS — N186 End stage renal disease: Secondary | ICD-10-CM | POA: Diagnosis not present

## 2016-04-30 DIAGNOSIS — N186 End stage renal disease: Secondary | ICD-10-CM | POA: Diagnosis not present

## 2016-05-02 DIAGNOSIS — N2581 Secondary hyperparathyroidism of renal origin: Secondary | ICD-10-CM | POA: Diagnosis not present

## 2016-05-02 DIAGNOSIS — E1151 Type 2 diabetes mellitus with diabetic peripheral angiopathy without gangrene: Secondary | ICD-10-CM | POA: Diagnosis not present

## 2016-05-02 DIAGNOSIS — E8779 Other fluid overload: Secondary | ICD-10-CM | POA: Diagnosis not present

## 2016-05-02 DIAGNOSIS — N186 End stage renal disease: Secondary | ICD-10-CM | POA: Diagnosis not present

## 2016-05-02 DIAGNOSIS — D631 Anemia in chronic kidney disease: Secondary | ICD-10-CM | POA: Diagnosis not present

## 2016-05-02 DIAGNOSIS — Z23 Encounter for immunization: Secondary | ICD-10-CM | POA: Diagnosis not present

## 2016-05-03 ENCOUNTER — Other Ambulatory Visit: Payer: Self-pay

## 2016-05-03 ENCOUNTER — Ambulatory Visit: Payer: Medicare Other | Admitting: Gastroenterology

## 2016-05-04 DIAGNOSIS — Z23 Encounter for immunization: Secondary | ICD-10-CM | POA: Diagnosis not present

## 2016-05-04 DIAGNOSIS — E8779 Other fluid overload: Secondary | ICD-10-CM | POA: Diagnosis not present

## 2016-05-04 DIAGNOSIS — E1151 Type 2 diabetes mellitus with diabetic peripheral angiopathy without gangrene: Secondary | ICD-10-CM | POA: Diagnosis not present

## 2016-05-04 DIAGNOSIS — D631 Anemia in chronic kidney disease: Secondary | ICD-10-CM | POA: Diagnosis not present

## 2016-05-04 DIAGNOSIS — N186 End stage renal disease: Secondary | ICD-10-CM | POA: Diagnosis not present

## 2016-05-04 DIAGNOSIS — N2581 Secondary hyperparathyroidism of renal origin: Secondary | ICD-10-CM | POA: Diagnosis not present

## 2016-05-07 DIAGNOSIS — Z23 Encounter for immunization: Secondary | ICD-10-CM | POA: Diagnosis not present

## 2016-05-07 DIAGNOSIS — E8779 Other fluid overload: Secondary | ICD-10-CM | POA: Diagnosis not present

## 2016-05-07 DIAGNOSIS — N2581 Secondary hyperparathyroidism of renal origin: Secondary | ICD-10-CM | POA: Diagnosis not present

## 2016-05-07 DIAGNOSIS — N186 End stage renal disease: Secondary | ICD-10-CM | POA: Diagnosis not present

## 2016-05-07 DIAGNOSIS — E1151 Type 2 diabetes mellitus with diabetic peripheral angiopathy without gangrene: Secondary | ICD-10-CM | POA: Diagnosis not present

## 2016-05-07 DIAGNOSIS — D631 Anemia in chronic kidney disease: Secondary | ICD-10-CM | POA: Diagnosis not present

## 2016-05-09 DIAGNOSIS — E8779 Other fluid overload: Secondary | ICD-10-CM | POA: Diagnosis not present

## 2016-05-09 DIAGNOSIS — N186 End stage renal disease: Secondary | ICD-10-CM | POA: Diagnosis not present

## 2016-05-09 DIAGNOSIS — D631 Anemia in chronic kidney disease: Secondary | ICD-10-CM | POA: Diagnosis not present

## 2016-05-09 DIAGNOSIS — N2581 Secondary hyperparathyroidism of renal origin: Secondary | ICD-10-CM | POA: Diagnosis not present

## 2016-05-09 DIAGNOSIS — E1151 Type 2 diabetes mellitus with diabetic peripheral angiopathy without gangrene: Secondary | ICD-10-CM | POA: Diagnosis not present

## 2016-05-09 DIAGNOSIS — Z23 Encounter for immunization: Secondary | ICD-10-CM | POA: Diagnosis not present

## 2016-05-11 DIAGNOSIS — N186 End stage renal disease: Secondary | ICD-10-CM | POA: Diagnosis not present

## 2016-05-11 DIAGNOSIS — E8779 Other fluid overload: Secondary | ICD-10-CM | POA: Diagnosis not present

## 2016-05-11 DIAGNOSIS — N2581 Secondary hyperparathyroidism of renal origin: Secondary | ICD-10-CM | POA: Diagnosis not present

## 2016-05-11 DIAGNOSIS — E1151 Type 2 diabetes mellitus with diabetic peripheral angiopathy without gangrene: Secondary | ICD-10-CM | POA: Diagnosis not present

## 2016-05-11 DIAGNOSIS — D631 Anemia in chronic kidney disease: Secondary | ICD-10-CM | POA: Diagnosis not present

## 2016-05-11 DIAGNOSIS — Z23 Encounter for immunization: Secondary | ICD-10-CM | POA: Diagnosis not present

## 2016-05-14 DIAGNOSIS — E8779 Other fluid overload: Secondary | ICD-10-CM | POA: Diagnosis not present

## 2016-05-14 DIAGNOSIS — N186 End stage renal disease: Secondary | ICD-10-CM | POA: Diagnosis not present

## 2016-05-14 DIAGNOSIS — D631 Anemia in chronic kidney disease: Secondary | ICD-10-CM | POA: Diagnosis not present

## 2016-05-14 DIAGNOSIS — Z23 Encounter for immunization: Secondary | ICD-10-CM | POA: Diagnosis not present

## 2016-05-14 DIAGNOSIS — E1151 Type 2 diabetes mellitus with diabetic peripheral angiopathy without gangrene: Secondary | ICD-10-CM | POA: Diagnosis not present

## 2016-05-14 DIAGNOSIS — N2581 Secondary hyperparathyroidism of renal origin: Secondary | ICD-10-CM | POA: Diagnosis not present

## 2016-05-16 DIAGNOSIS — Z23 Encounter for immunization: Secondary | ICD-10-CM | POA: Diagnosis not present

## 2016-05-16 DIAGNOSIS — N2581 Secondary hyperparathyroidism of renal origin: Secondary | ICD-10-CM | POA: Diagnosis not present

## 2016-05-16 DIAGNOSIS — E8779 Other fluid overload: Secondary | ICD-10-CM | POA: Diagnosis not present

## 2016-05-16 DIAGNOSIS — E1151 Type 2 diabetes mellitus with diabetic peripheral angiopathy without gangrene: Secondary | ICD-10-CM | POA: Diagnosis not present

## 2016-05-16 DIAGNOSIS — D631 Anemia in chronic kidney disease: Secondary | ICD-10-CM | POA: Diagnosis not present

## 2016-05-16 DIAGNOSIS — N186 End stage renal disease: Secondary | ICD-10-CM | POA: Diagnosis not present

## 2016-05-17 DIAGNOSIS — N186 End stage renal disease: Secondary | ICD-10-CM | POA: Diagnosis not present

## 2016-05-17 DIAGNOSIS — E8779 Other fluid overload: Secondary | ICD-10-CM | POA: Diagnosis not present

## 2016-05-17 DIAGNOSIS — D631 Anemia in chronic kidney disease: Secondary | ICD-10-CM | POA: Diagnosis not present

## 2016-05-17 DIAGNOSIS — E1151 Type 2 diabetes mellitus with diabetic peripheral angiopathy without gangrene: Secondary | ICD-10-CM | POA: Diagnosis not present

## 2016-05-17 DIAGNOSIS — N2581 Secondary hyperparathyroidism of renal origin: Secondary | ICD-10-CM | POA: Diagnosis not present

## 2016-05-17 DIAGNOSIS — Z23 Encounter for immunization: Secondary | ICD-10-CM | POA: Diagnosis not present

## 2016-05-18 DIAGNOSIS — N2581 Secondary hyperparathyroidism of renal origin: Secondary | ICD-10-CM | POA: Diagnosis not present

## 2016-05-18 DIAGNOSIS — E8779 Other fluid overload: Secondary | ICD-10-CM | POA: Diagnosis not present

## 2016-05-18 DIAGNOSIS — D631 Anemia in chronic kidney disease: Secondary | ICD-10-CM | POA: Diagnosis not present

## 2016-05-18 DIAGNOSIS — Z23 Encounter for immunization: Secondary | ICD-10-CM | POA: Diagnosis not present

## 2016-05-18 DIAGNOSIS — E1151 Type 2 diabetes mellitus with diabetic peripheral angiopathy without gangrene: Secondary | ICD-10-CM | POA: Diagnosis not present

## 2016-05-18 DIAGNOSIS — N186 End stage renal disease: Secondary | ICD-10-CM | POA: Diagnosis not present

## 2016-05-21 DIAGNOSIS — Z23 Encounter for immunization: Secondary | ICD-10-CM | POA: Diagnosis not present

## 2016-05-21 DIAGNOSIS — E1151 Type 2 diabetes mellitus with diabetic peripheral angiopathy without gangrene: Secondary | ICD-10-CM | POA: Diagnosis not present

## 2016-05-21 DIAGNOSIS — E8779 Other fluid overload: Secondary | ICD-10-CM | POA: Diagnosis not present

## 2016-05-21 DIAGNOSIS — N2581 Secondary hyperparathyroidism of renal origin: Secondary | ICD-10-CM | POA: Diagnosis not present

## 2016-05-21 DIAGNOSIS — N186 End stage renal disease: Secondary | ICD-10-CM | POA: Diagnosis not present

## 2016-05-21 DIAGNOSIS — D631 Anemia in chronic kidney disease: Secondary | ICD-10-CM | POA: Diagnosis not present

## 2016-05-22 DIAGNOSIS — M25559 Pain in unspecified hip: Secondary | ICD-10-CM | POA: Diagnosis not present

## 2016-05-22 DIAGNOSIS — N400 Enlarged prostate without lower urinary tract symptoms: Secondary | ICD-10-CM | POA: Diagnosis not present

## 2016-05-22 DIAGNOSIS — R972 Elevated prostate specific antigen [PSA]: Secondary | ICD-10-CM | POA: Diagnosis not present

## 2016-05-22 DIAGNOSIS — Z79899 Other long term (current) drug therapy: Secondary | ICD-10-CM | POA: Diagnosis not present

## 2016-05-22 DIAGNOSIS — M549 Dorsalgia, unspecified: Secondary | ICD-10-CM | POA: Diagnosis not present

## 2016-05-22 DIAGNOSIS — R109 Unspecified abdominal pain: Secondary | ICD-10-CM | POA: Diagnosis not present

## 2016-05-23 DIAGNOSIS — Z23 Encounter for immunization: Secondary | ICD-10-CM | POA: Diagnosis not present

## 2016-05-23 DIAGNOSIS — E8779 Other fluid overload: Secondary | ICD-10-CM | POA: Diagnosis not present

## 2016-05-23 DIAGNOSIS — M5417 Radiculopathy, lumbosacral region: Secondary | ICD-10-CM | POA: Diagnosis not present

## 2016-05-23 DIAGNOSIS — N2581 Secondary hyperparathyroidism of renal origin: Secondary | ICD-10-CM | POA: Diagnosis not present

## 2016-05-23 DIAGNOSIS — E1151 Type 2 diabetes mellitus with diabetic peripheral angiopathy without gangrene: Secondary | ICD-10-CM | POA: Diagnosis not present

## 2016-05-23 DIAGNOSIS — N186 End stage renal disease: Secondary | ICD-10-CM | POA: Diagnosis not present

## 2016-05-23 DIAGNOSIS — D631 Anemia in chronic kidney disease: Secondary | ICD-10-CM | POA: Diagnosis not present

## 2016-05-25 DIAGNOSIS — E1151 Type 2 diabetes mellitus with diabetic peripheral angiopathy without gangrene: Secondary | ICD-10-CM | POA: Diagnosis not present

## 2016-05-25 DIAGNOSIS — D631 Anemia in chronic kidney disease: Secondary | ICD-10-CM | POA: Diagnosis not present

## 2016-05-25 DIAGNOSIS — N2581 Secondary hyperparathyroidism of renal origin: Secondary | ICD-10-CM | POA: Diagnosis not present

## 2016-05-25 DIAGNOSIS — E8779 Other fluid overload: Secondary | ICD-10-CM | POA: Diagnosis not present

## 2016-05-25 DIAGNOSIS — N186 End stage renal disease: Secondary | ICD-10-CM | POA: Diagnosis not present

## 2016-05-25 DIAGNOSIS — Z23 Encounter for immunization: Secondary | ICD-10-CM | POA: Diagnosis not present

## 2016-05-26 DIAGNOSIS — Z992 Dependence on renal dialysis: Secondary | ICD-10-CM | POA: Diagnosis not present

## 2016-05-26 DIAGNOSIS — T8620 Unspecified complication of heart transplant: Secondary | ICD-10-CM | POA: Diagnosis not present

## 2016-05-26 DIAGNOSIS — N186 End stage renal disease: Secondary | ICD-10-CM | POA: Diagnosis not present

## 2016-05-28 DIAGNOSIS — N186 End stage renal disease: Secondary | ICD-10-CM | POA: Diagnosis not present

## 2016-05-28 DIAGNOSIS — E8770 Fluid overload, unspecified: Secondary | ICD-10-CM | POA: Diagnosis not present

## 2016-05-28 DIAGNOSIS — N2581 Secondary hyperparathyroidism of renal origin: Secondary | ICD-10-CM | POA: Diagnosis not present

## 2016-05-28 DIAGNOSIS — D631 Anemia in chronic kidney disease: Secondary | ICD-10-CM | POA: Diagnosis not present

## 2016-05-30 DIAGNOSIS — N2581 Secondary hyperparathyroidism of renal origin: Secondary | ICD-10-CM | POA: Diagnosis not present

## 2016-05-30 DIAGNOSIS — D631 Anemia in chronic kidney disease: Secondary | ICD-10-CM | POA: Diagnosis not present

## 2016-05-30 DIAGNOSIS — N186 End stage renal disease: Secondary | ICD-10-CM | POA: Diagnosis not present

## 2016-05-30 DIAGNOSIS — E8770 Fluid overload, unspecified: Secondary | ICD-10-CM | POA: Diagnosis not present

## 2016-05-31 DIAGNOSIS — E8770 Fluid overload, unspecified: Secondary | ICD-10-CM | POA: Diagnosis not present

## 2016-05-31 DIAGNOSIS — N186 End stage renal disease: Secondary | ICD-10-CM | POA: Diagnosis not present

## 2016-05-31 DIAGNOSIS — D631 Anemia in chronic kidney disease: Secondary | ICD-10-CM | POA: Diagnosis not present

## 2016-05-31 DIAGNOSIS — N2581 Secondary hyperparathyroidism of renal origin: Secondary | ICD-10-CM | POA: Diagnosis not present

## 2016-06-01 DIAGNOSIS — D631 Anemia in chronic kidney disease: Secondary | ICD-10-CM | POA: Diagnosis not present

## 2016-06-01 DIAGNOSIS — E8770 Fluid overload, unspecified: Secondary | ICD-10-CM | POA: Diagnosis not present

## 2016-06-01 DIAGNOSIS — N186 End stage renal disease: Secondary | ICD-10-CM | POA: Diagnosis not present

## 2016-06-01 DIAGNOSIS — N2581 Secondary hyperparathyroidism of renal origin: Secondary | ICD-10-CM | POA: Diagnosis not present

## 2016-06-04 DIAGNOSIS — D631 Anemia in chronic kidney disease: Secondary | ICD-10-CM | POA: Diagnosis not present

## 2016-06-04 DIAGNOSIS — E8770 Fluid overload, unspecified: Secondary | ICD-10-CM | POA: Diagnosis not present

## 2016-06-04 DIAGNOSIS — N186 End stage renal disease: Secondary | ICD-10-CM | POA: Diagnosis not present

## 2016-06-04 DIAGNOSIS — N2581 Secondary hyperparathyroidism of renal origin: Secondary | ICD-10-CM | POA: Diagnosis not present

## 2016-06-06 DIAGNOSIS — E8770 Fluid overload, unspecified: Secondary | ICD-10-CM | POA: Diagnosis not present

## 2016-06-06 DIAGNOSIS — N2581 Secondary hyperparathyroidism of renal origin: Secondary | ICD-10-CM | POA: Diagnosis not present

## 2016-06-06 DIAGNOSIS — D631 Anemia in chronic kidney disease: Secondary | ICD-10-CM | POA: Diagnosis not present

## 2016-06-06 DIAGNOSIS — N186 End stage renal disease: Secondary | ICD-10-CM | POA: Diagnosis not present

## 2016-06-08 DIAGNOSIS — E8770 Fluid overload, unspecified: Secondary | ICD-10-CM | POA: Diagnosis not present

## 2016-06-08 DIAGNOSIS — N186 End stage renal disease: Secondary | ICD-10-CM | POA: Diagnosis not present

## 2016-06-08 DIAGNOSIS — N2581 Secondary hyperparathyroidism of renal origin: Secondary | ICD-10-CM | POA: Diagnosis not present

## 2016-06-08 DIAGNOSIS — D631 Anemia in chronic kidney disease: Secondary | ICD-10-CM | POA: Diagnosis not present

## 2016-06-11 DIAGNOSIS — E8770 Fluid overload, unspecified: Secondary | ICD-10-CM | POA: Diagnosis not present

## 2016-06-11 DIAGNOSIS — D631 Anemia in chronic kidney disease: Secondary | ICD-10-CM | POA: Diagnosis not present

## 2016-06-11 DIAGNOSIS — N2581 Secondary hyperparathyroidism of renal origin: Secondary | ICD-10-CM | POA: Diagnosis not present

## 2016-06-11 DIAGNOSIS — N186 End stage renal disease: Secondary | ICD-10-CM | POA: Diagnosis not present

## 2016-06-13 DIAGNOSIS — N2581 Secondary hyperparathyroidism of renal origin: Secondary | ICD-10-CM | POA: Diagnosis not present

## 2016-06-13 DIAGNOSIS — N186 End stage renal disease: Secondary | ICD-10-CM | POA: Diagnosis not present

## 2016-06-13 DIAGNOSIS — D631 Anemia in chronic kidney disease: Secondary | ICD-10-CM | POA: Diagnosis not present

## 2016-06-13 DIAGNOSIS — E8770 Fluid overload, unspecified: Secondary | ICD-10-CM | POA: Diagnosis not present

## 2016-06-15 DIAGNOSIS — E8770 Fluid overload, unspecified: Secondary | ICD-10-CM | POA: Diagnosis not present

## 2016-06-15 DIAGNOSIS — N186 End stage renal disease: Secondary | ICD-10-CM | POA: Diagnosis not present

## 2016-06-15 DIAGNOSIS — D631 Anemia in chronic kidney disease: Secondary | ICD-10-CM | POA: Diagnosis not present

## 2016-06-15 DIAGNOSIS — N2581 Secondary hyperparathyroidism of renal origin: Secondary | ICD-10-CM | POA: Diagnosis not present

## 2016-06-18 DIAGNOSIS — N186 End stage renal disease: Secondary | ICD-10-CM | POA: Diagnosis not present

## 2016-06-18 DIAGNOSIS — N2581 Secondary hyperparathyroidism of renal origin: Secondary | ICD-10-CM | POA: Diagnosis not present

## 2016-06-18 DIAGNOSIS — D631 Anemia in chronic kidney disease: Secondary | ICD-10-CM | POA: Diagnosis not present

## 2016-06-18 DIAGNOSIS — E8770 Fluid overload, unspecified: Secondary | ICD-10-CM | POA: Diagnosis not present

## 2016-06-20 DIAGNOSIS — E1122 Type 2 diabetes mellitus with diabetic chronic kidney disease: Secondary | ICD-10-CM | POA: Diagnosis not present

## 2016-06-20 DIAGNOSIS — E232 Diabetes insipidus: Secondary | ICD-10-CM | POA: Diagnosis not present

## 2016-06-20 DIAGNOSIS — N2581 Secondary hyperparathyroidism of renal origin: Secondary | ICD-10-CM | POA: Diagnosis not present

## 2016-06-20 DIAGNOSIS — D631 Anemia in chronic kidney disease: Secondary | ICD-10-CM | POA: Diagnosis not present

## 2016-06-20 DIAGNOSIS — N186 End stage renal disease: Secondary | ICD-10-CM | POA: Diagnosis not present

## 2016-06-20 DIAGNOSIS — E8770 Fluid overload, unspecified: Secondary | ICD-10-CM | POA: Diagnosis not present

## 2016-06-22 DIAGNOSIS — N186 End stage renal disease: Secondary | ICD-10-CM | POA: Diagnosis not present

## 2016-06-22 DIAGNOSIS — N2581 Secondary hyperparathyroidism of renal origin: Secondary | ICD-10-CM | POA: Diagnosis not present

## 2016-06-22 DIAGNOSIS — D631 Anemia in chronic kidney disease: Secondary | ICD-10-CM | POA: Diagnosis not present

## 2016-06-22 DIAGNOSIS — E8770 Fluid overload, unspecified: Secondary | ICD-10-CM | POA: Diagnosis not present

## 2016-06-25 DIAGNOSIS — D631 Anemia in chronic kidney disease: Secondary | ICD-10-CM | POA: Diagnosis not present

## 2016-06-25 DIAGNOSIS — M5441 Lumbago with sciatica, right side: Secondary | ICD-10-CM | POA: Diagnosis not present

## 2016-06-25 DIAGNOSIS — E8770 Fluid overload, unspecified: Secondary | ICD-10-CM | POA: Diagnosis not present

## 2016-06-25 DIAGNOSIS — N186 End stage renal disease: Secondary | ICD-10-CM | POA: Diagnosis not present

## 2016-06-25 DIAGNOSIS — N2581 Secondary hyperparathyroidism of renal origin: Secondary | ICD-10-CM | POA: Diagnosis not present

## 2016-06-26 DIAGNOSIS — T8620 Unspecified complication of heart transplant: Secondary | ICD-10-CM | POA: Diagnosis not present

## 2016-06-26 DIAGNOSIS — N186 End stage renal disease: Secondary | ICD-10-CM | POA: Diagnosis not present

## 2016-06-26 DIAGNOSIS — Z992 Dependence on renal dialysis: Secondary | ICD-10-CM | POA: Diagnosis not present

## 2016-06-27 DIAGNOSIS — D509 Iron deficiency anemia, unspecified: Secondary | ICD-10-CM | POA: Diagnosis not present

## 2016-06-27 DIAGNOSIS — D631 Anemia in chronic kidney disease: Secondary | ICD-10-CM | POA: Diagnosis not present

## 2016-06-27 DIAGNOSIS — N2581 Secondary hyperparathyroidism of renal origin: Secondary | ICD-10-CM | POA: Diagnosis not present

## 2016-06-27 DIAGNOSIS — N186 End stage renal disease: Secondary | ICD-10-CM | POA: Diagnosis not present

## 2016-06-28 DIAGNOSIS — N186 End stage renal disease: Secondary | ICD-10-CM | POA: Diagnosis not present

## 2016-06-28 DIAGNOSIS — F419 Anxiety disorder, unspecified: Secondary | ICD-10-CM | POA: Diagnosis not present

## 2016-06-28 DIAGNOSIS — Z992 Dependence on renal dialysis: Secondary | ICD-10-CM | POA: Diagnosis not present

## 2016-06-28 DIAGNOSIS — I639 Cerebral infarction, unspecified: Secondary | ICD-10-CM | POA: Diagnosis not present

## 2016-06-28 DIAGNOSIS — D631 Anemia in chronic kidney disease: Secondary | ICD-10-CM | POA: Diagnosis not present

## 2016-06-28 DIAGNOSIS — R7303 Prediabetes: Secondary | ICD-10-CM | POA: Diagnosis not present

## 2016-06-28 DIAGNOSIS — Z7689 Persons encountering health services in other specified circumstances: Secondary | ICD-10-CM | POA: Diagnosis not present

## 2016-06-28 DIAGNOSIS — N189 Chronic kidney disease, unspecified: Secondary | ICD-10-CM | POA: Diagnosis not present

## 2016-06-28 DIAGNOSIS — K219 Gastro-esophageal reflux disease without esophagitis: Secondary | ICD-10-CM | POA: Diagnosis not present

## 2016-06-28 DIAGNOSIS — R739 Hyperglycemia, unspecified: Secondary | ICD-10-CM | POA: Diagnosis not present

## 2016-06-29 DIAGNOSIS — N2581 Secondary hyperparathyroidism of renal origin: Secondary | ICD-10-CM | POA: Diagnosis not present

## 2016-06-29 DIAGNOSIS — D509 Iron deficiency anemia, unspecified: Secondary | ICD-10-CM | POA: Diagnosis not present

## 2016-06-29 DIAGNOSIS — N186 End stage renal disease: Secondary | ICD-10-CM | POA: Diagnosis not present

## 2016-06-29 DIAGNOSIS — D631 Anemia in chronic kidney disease: Secondary | ICD-10-CM | POA: Diagnosis not present

## 2016-07-02 DIAGNOSIS — N2581 Secondary hyperparathyroidism of renal origin: Secondary | ICD-10-CM | POA: Diagnosis not present

## 2016-07-02 DIAGNOSIS — D509 Iron deficiency anemia, unspecified: Secondary | ICD-10-CM | POA: Diagnosis not present

## 2016-07-02 DIAGNOSIS — N186 End stage renal disease: Secondary | ICD-10-CM | POA: Diagnosis not present

## 2016-07-02 DIAGNOSIS — D631 Anemia in chronic kidney disease: Secondary | ICD-10-CM | POA: Diagnosis not present

## 2016-07-03 DIAGNOSIS — M5441 Lumbago with sciatica, right side: Secondary | ICD-10-CM | POA: Diagnosis not present

## 2016-07-04 DIAGNOSIS — D509 Iron deficiency anemia, unspecified: Secondary | ICD-10-CM | POA: Diagnosis not present

## 2016-07-04 DIAGNOSIS — N2581 Secondary hyperparathyroidism of renal origin: Secondary | ICD-10-CM | POA: Diagnosis not present

## 2016-07-04 DIAGNOSIS — N186 End stage renal disease: Secondary | ICD-10-CM | POA: Diagnosis not present

## 2016-07-04 DIAGNOSIS — D631 Anemia in chronic kidney disease: Secondary | ICD-10-CM | POA: Diagnosis not present

## 2016-07-06 DIAGNOSIS — N186 End stage renal disease: Secondary | ICD-10-CM | POA: Diagnosis not present

## 2016-07-06 DIAGNOSIS — N2581 Secondary hyperparathyroidism of renal origin: Secondary | ICD-10-CM | POA: Diagnosis not present

## 2016-07-06 DIAGNOSIS — D509 Iron deficiency anemia, unspecified: Secondary | ICD-10-CM | POA: Diagnosis not present

## 2016-07-06 DIAGNOSIS — D631 Anemia in chronic kidney disease: Secondary | ICD-10-CM | POA: Diagnosis not present

## 2016-07-09 DIAGNOSIS — D631 Anemia in chronic kidney disease: Secondary | ICD-10-CM | POA: Diagnosis not present

## 2016-07-09 DIAGNOSIS — N2581 Secondary hyperparathyroidism of renal origin: Secondary | ICD-10-CM | POA: Diagnosis not present

## 2016-07-09 DIAGNOSIS — D509 Iron deficiency anemia, unspecified: Secondary | ICD-10-CM | POA: Diagnosis not present

## 2016-07-09 DIAGNOSIS — N186 End stage renal disease: Secondary | ICD-10-CM | POA: Diagnosis not present

## 2016-07-10 DIAGNOSIS — M5441 Lumbago with sciatica, right side: Secondary | ICD-10-CM | POA: Diagnosis not present

## 2016-07-11 DIAGNOSIS — N2581 Secondary hyperparathyroidism of renal origin: Secondary | ICD-10-CM | POA: Diagnosis not present

## 2016-07-11 DIAGNOSIS — N186 End stage renal disease: Secondary | ICD-10-CM | POA: Diagnosis not present

## 2016-07-11 DIAGNOSIS — D631 Anemia in chronic kidney disease: Secondary | ICD-10-CM | POA: Diagnosis not present

## 2016-07-11 DIAGNOSIS — D509 Iron deficiency anemia, unspecified: Secondary | ICD-10-CM | POA: Diagnosis not present

## 2016-07-13 DIAGNOSIS — N186 End stage renal disease: Secondary | ICD-10-CM | POA: Diagnosis not present

## 2016-07-13 DIAGNOSIS — D509 Iron deficiency anemia, unspecified: Secondary | ICD-10-CM | POA: Diagnosis not present

## 2016-07-13 DIAGNOSIS — N2581 Secondary hyperparathyroidism of renal origin: Secondary | ICD-10-CM | POA: Diagnosis not present

## 2016-07-13 DIAGNOSIS — D631 Anemia in chronic kidney disease: Secondary | ICD-10-CM | POA: Diagnosis not present

## 2016-07-15 DIAGNOSIS — D631 Anemia in chronic kidney disease: Secondary | ICD-10-CM | POA: Diagnosis not present

## 2016-07-15 DIAGNOSIS — N186 End stage renal disease: Secondary | ICD-10-CM | POA: Diagnosis not present

## 2016-07-15 DIAGNOSIS — N2581 Secondary hyperparathyroidism of renal origin: Secondary | ICD-10-CM | POA: Diagnosis not present

## 2016-07-15 DIAGNOSIS — D509 Iron deficiency anemia, unspecified: Secondary | ICD-10-CM | POA: Diagnosis not present

## 2016-07-17 DIAGNOSIS — N2581 Secondary hyperparathyroidism of renal origin: Secondary | ICD-10-CM | POA: Diagnosis not present

## 2016-07-17 DIAGNOSIS — N186 End stage renal disease: Secondary | ICD-10-CM | POA: Diagnosis not present

## 2016-07-17 DIAGNOSIS — D631 Anemia in chronic kidney disease: Secondary | ICD-10-CM | POA: Diagnosis not present

## 2016-07-17 DIAGNOSIS — D509 Iron deficiency anemia, unspecified: Secondary | ICD-10-CM | POA: Diagnosis not present

## 2016-07-20 DIAGNOSIS — D509 Iron deficiency anemia, unspecified: Secondary | ICD-10-CM | POA: Diagnosis not present

## 2016-07-20 DIAGNOSIS — D631 Anemia in chronic kidney disease: Secondary | ICD-10-CM | POA: Diagnosis not present

## 2016-07-20 DIAGNOSIS — N186 End stage renal disease: Secondary | ICD-10-CM | POA: Diagnosis not present

## 2016-07-20 DIAGNOSIS — N2581 Secondary hyperparathyroidism of renal origin: Secondary | ICD-10-CM | POA: Diagnosis not present

## 2016-07-23 DIAGNOSIS — D631 Anemia in chronic kidney disease: Secondary | ICD-10-CM | POA: Diagnosis not present

## 2016-07-23 DIAGNOSIS — N186 End stage renal disease: Secondary | ICD-10-CM | POA: Diagnosis not present

## 2016-07-23 DIAGNOSIS — D509 Iron deficiency anemia, unspecified: Secondary | ICD-10-CM | POA: Diagnosis not present

## 2016-07-23 DIAGNOSIS — N2581 Secondary hyperparathyroidism of renal origin: Secondary | ICD-10-CM | POA: Diagnosis not present

## 2016-07-25 DIAGNOSIS — N186 End stage renal disease: Secondary | ICD-10-CM | POA: Diagnosis not present

## 2016-07-25 DIAGNOSIS — D631 Anemia in chronic kidney disease: Secondary | ICD-10-CM | POA: Diagnosis not present

## 2016-07-25 DIAGNOSIS — N2581 Secondary hyperparathyroidism of renal origin: Secondary | ICD-10-CM | POA: Diagnosis not present

## 2016-07-25 DIAGNOSIS — D509 Iron deficiency anemia, unspecified: Secondary | ICD-10-CM | POA: Diagnosis not present

## 2016-07-26 ENCOUNTER — Other Ambulatory Visit: Payer: Self-pay | Admitting: Family Medicine

## 2016-07-26 ENCOUNTER — Ambulatory Visit
Admission: RE | Admit: 2016-07-26 | Discharge: 2016-07-26 | Disposition: A | Discharge status: Home / Self Care | Payer: Medicare Other | Source: Ambulatory Visit | Attending: Family Medicine | Admitting: Family Medicine

## 2016-07-26 DIAGNOSIS — Z01818 Encounter for other preprocedural examination: Secondary | ICD-10-CM

## 2016-07-26 DIAGNOSIS — N186 End stage renal disease: Secondary | ICD-10-CM | POA: Diagnosis not present

## 2016-07-26 DIAGNOSIS — T8620 Unspecified complication of heart transplant: Secondary | ICD-10-CM | POA: Diagnosis not present

## 2016-07-26 DIAGNOSIS — E782 Mixed hyperlipidemia: Secondary | ICD-10-CM | POA: Diagnosis not present

## 2016-07-26 DIAGNOSIS — N185 Chronic kidney disease, stage 5: Secondary | ICD-10-CM | POA: Diagnosis not present

## 2016-07-26 DIAGNOSIS — Z992 Dependence on renal dialysis: Secondary | ICD-10-CM | POA: Diagnosis not present

## 2016-07-26 DIAGNOSIS — I10 Essential (primary) hypertension: Secondary | ICD-10-CM | POA: Diagnosis not present

## 2016-07-26 DIAGNOSIS — M1611 Unilateral primary osteoarthritis, right hip: Secondary | ICD-10-CM | POA: Diagnosis not present

## 2016-07-26 DIAGNOSIS — M25551 Pain in right hip: Secondary | ICD-10-CM

## 2016-07-27 DIAGNOSIS — N2581 Secondary hyperparathyroidism of renal origin: Secondary | ICD-10-CM | POA: Diagnosis not present

## 2016-07-27 DIAGNOSIS — D631 Anemia in chronic kidney disease: Secondary | ICD-10-CM | POA: Diagnosis not present

## 2016-07-27 DIAGNOSIS — N186 End stage renal disease: Secondary | ICD-10-CM | POA: Diagnosis not present

## 2016-07-27 DIAGNOSIS — E1151 Type 2 diabetes mellitus with diabetic peripheral angiopathy without gangrene: Secondary | ICD-10-CM | POA: Diagnosis not present

## 2016-07-27 DIAGNOSIS — D509 Iron deficiency anemia, unspecified: Secondary | ICD-10-CM | POA: Diagnosis not present

## 2016-07-30 DIAGNOSIS — D631 Anemia in chronic kidney disease: Secondary | ICD-10-CM | POA: Diagnosis not present

## 2016-07-30 DIAGNOSIS — D509 Iron deficiency anemia, unspecified: Secondary | ICD-10-CM | POA: Diagnosis not present

## 2016-07-30 DIAGNOSIS — N186 End stage renal disease: Secondary | ICD-10-CM | POA: Diagnosis not present

## 2016-07-30 DIAGNOSIS — N2581 Secondary hyperparathyroidism of renal origin: Secondary | ICD-10-CM | POA: Diagnosis not present

## 2016-07-30 DIAGNOSIS — E1151 Type 2 diabetes mellitus with diabetic peripheral angiopathy without gangrene: Secondary | ICD-10-CM | POA: Diagnosis not present

## 2016-08-01 DIAGNOSIS — N186 End stage renal disease: Secondary | ICD-10-CM | POA: Diagnosis not present

## 2016-08-01 DIAGNOSIS — E1151 Type 2 diabetes mellitus with diabetic peripheral angiopathy without gangrene: Secondary | ICD-10-CM | POA: Diagnosis not present

## 2016-08-01 DIAGNOSIS — D631 Anemia in chronic kidney disease: Secondary | ICD-10-CM | POA: Diagnosis not present

## 2016-08-01 DIAGNOSIS — N2581 Secondary hyperparathyroidism of renal origin: Secondary | ICD-10-CM | POA: Diagnosis not present

## 2016-08-01 DIAGNOSIS — D509 Iron deficiency anemia, unspecified: Secondary | ICD-10-CM | POA: Diagnosis not present

## 2016-08-02 DIAGNOSIS — I120 Hypertensive chronic kidney disease with stage 5 chronic kidney disease or end stage renal disease: Secondary | ICD-10-CM | POA: Diagnosis not present

## 2016-08-02 DIAGNOSIS — I10 Essential (primary) hypertension: Secondary | ICD-10-CM | POA: Diagnosis not present

## 2016-08-02 DIAGNOSIS — D7582 Heparin induced thrombocytopenia (HIT): Secondary | ICD-10-CM | POA: Diagnosis not present

## 2016-08-02 DIAGNOSIS — N2589 Other disorders resulting from impaired renal tubular function: Secondary | ICD-10-CM | POA: Diagnosis not present

## 2016-08-02 DIAGNOSIS — Z01818 Encounter for other preprocedural examination: Secondary | ICD-10-CM | POA: Diagnosis not present

## 2016-08-02 DIAGNOSIS — E782 Mixed hyperlipidemia: Secondary | ICD-10-CM | POA: Diagnosis not present

## 2016-08-02 DIAGNOSIS — N186 End stage renal disease: Secondary | ICD-10-CM | POA: Diagnosis not present

## 2016-08-02 DIAGNOSIS — F419 Anxiety disorder, unspecified: Secondary | ICD-10-CM | POA: Diagnosis not present

## 2016-08-02 DIAGNOSIS — Z941 Heart transplant status: Secondary | ICD-10-CM | POA: Diagnosis not present

## 2016-08-02 DIAGNOSIS — E785 Hyperlipidemia, unspecified: Secondary | ICD-10-CM | POA: Diagnosis not present

## 2016-08-02 DIAGNOSIS — D631 Anemia in chronic kidney disease: Secondary | ICD-10-CM | POA: Diagnosis not present

## 2016-08-02 DIAGNOSIS — E1100 Type 2 diabetes mellitus with hyperosmolarity without nonketotic hyperglycemic-hyperosmolar coma (NKHHC): Secondary | ICD-10-CM | POA: Diagnosis not present

## 2016-08-02 DIAGNOSIS — Z8673 Personal history of transient ischemic attack (TIA), and cerebral infarction without residual deficits: Secondary | ICD-10-CM | POA: Diagnosis not present

## 2016-08-02 DIAGNOSIS — E1122 Type 2 diabetes mellitus with diabetic chronic kidney disease: Secondary | ICD-10-CM | POA: Diagnosis not present

## 2016-08-02 DIAGNOSIS — E875 Hyperkalemia: Secondary | ICD-10-CM | POA: Diagnosis not present

## 2016-08-02 DIAGNOSIS — Z6841 Body Mass Index (BMI) 40.0 and over, adult: Secondary | ICD-10-CM | POA: Diagnosis not present

## 2016-08-02 DIAGNOSIS — Z9884 Bariatric surgery status: Secondary | ICD-10-CM | POA: Diagnosis not present

## 2016-08-03 DIAGNOSIS — N2581 Secondary hyperparathyroidism of renal origin: Secondary | ICD-10-CM | POA: Diagnosis not present

## 2016-08-03 DIAGNOSIS — D509 Iron deficiency anemia, unspecified: Secondary | ICD-10-CM | POA: Diagnosis not present

## 2016-08-03 DIAGNOSIS — D631 Anemia in chronic kidney disease: Secondary | ICD-10-CM | POA: Diagnosis not present

## 2016-08-03 DIAGNOSIS — E1151 Type 2 diabetes mellitus with diabetic peripheral angiopathy without gangrene: Secondary | ICD-10-CM | POA: Diagnosis not present

## 2016-08-03 DIAGNOSIS — N186 End stage renal disease: Secondary | ICD-10-CM | POA: Diagnosis not present

## 2016-08-06 DIAGNOSIS — D631 Anemia in chronic kidney disease: Secondary | ICD-10-CM | POA: Diagnosis not present

## 2016-08-06 DIAGNOSIS — E1151 Type 2 diabetes mellitus with diabetic peripheral angiopathy without gangrene: Secondary | ICD-10-CM | POA: Diagnosis not present

## 2016-08-06 DIAGNOSIS — N2581 Secondary hyperparathyroidism of renal origin: Secondary | ICD-10-CM | POA: Diagnosis not present

## 2016-08-06 DIAGNOSIS — N186 End stage renal disease: Secondary | ICD-10-CM | POA: Diagnosis not present

## 2016-08-06 DIAGNOSIS — D509 Iron deficiency anemia, unspecified: Secondary | ICD-10-CM | POA: Diagnosis not present

## 2016-08-07 DIAGNOSIS — M1611 Unilateral primary osteoarthritis, right hip: Secondary | ICD-10-CM | POA: Diagnosis not present

## 2016-08-07 DIAGNOSIS — I10 Essential (primary) hypertension: Secondary | ICD-10-CM | POA: Diagnosis not present

## 2016-08-07 DIAGNOSIS — E1122 Type 2 diabetes mellitus with diabetic chronic kidney disease: Secondary | ICD-10-CM | POA: Diagnosis not present

## 2016-08-08 DIAGNOSIS — E1151 Type 2 diabetes mellitus with diabetic peripheral angiopathy without gangrene: Secondary | ICD-10-CM | POA: Diagnosis not present

## 2016-08-08 DIAGNOSIS — N2581 Secondary hyperparathyroidism of renal origin: Secondary | ICD-10-CM | POA: Diagnosis not present

## 2016-08-08 DIAGNOSIS — D509 Iron deficiency anemia, unspecified: Secondary | ICD-10-CM | POA: Diagnosis not present

## 2016-08-08 DIAGNOSIS — D631 Anemia in chronic kidney disease: Secondary | ICD-10-CM | POA: Diagnosis not present

## 2016-08-08 DIAGNOSIS — N186 End stage renal disease: Secondary | ICD-10-CM | POA: Diagnosis not present

## 2016-08-09 DIAGNOSIS — M1611 Unilateral primary osteoarthritis, right hip: Secondary | ICD-10-CM | POA: Diagnosis not present

## 2016-08-10 DIAGNOSIS — E1151 Type 2 diabetes mellitus with diabetic peripheral angiopathy without gangrene: Secondary | ICD-10-CM | POA: Diagnosis not present

## 2016-08-10 DIAGNOSIS — D631 Anemia in chronic kidney disease: Secondary | ICD-10-CM | POA: Diagnosis not present

## 2016-08-10 DIAGNOSIS — D509 Iron deficiency anemia, unspecified: Secondary | ICD-10-CM | POA: Diagnosis not present

## 2016-08-10 DIAGNOSIS — N2581 Secondary hyperparathyroidism of renal origin: Secondary | ICD-10-CM | POA: Diagnosis not present

## 2016-08-10 DIAGNOSIS — N186 End stage renal disease: Secondary | ICD-10-CM | POA: Diagnosis not present

## 2016-08-13 DIAGNOSIS — E1151 Type 2 diabetes mellitus with diabetic peripheral angiopathy without gangrene: Secondary | ICD-10-CM | POA: Diagnosis not present

## 2016-08-13 DIAGNOSIS — N2581 Secondary hyperparathyroidism of renal origin: Secondary | ICD-10-CM | POA: Diagnosis not present

## 2016-08-13 DIAGNOSIS — D631 Anemia in chronic kidney disease: Secondary | ICD-10-CM | POA: Diagnosis not present

## 2016-08-13 DIAGNOSIS — D509 Iron deficiency anemia, unspecified: Secondary | ICD-10-CM | POA: Diagnosis not present

## 2016-08-13 DIAGNOSIS — N186 End stage renal disease: Secondary | ICD-10-CM | POA: Diagnosis not present

## 2016-08-15 DIAGNOSIS — D509 Iron deficiency anemia, unspecified: Secondary | ICD-10-CM | POA: Diagnosis not present

## 2016-08-15 DIAGNOSIS — E1151 Type 2 diabetes mellitus with diabetic peripheral angiopathy without gangrene: Secondary | ICD-10-CM | POA: Diagnosis not present

## 2016-08-15 DIAGNOSIS — N2581 Secondary hyperparathyroidism of renal origin: Secondary | ICD-10-CM | POA: Diagnosis not present

## 2016-08-15 DIAGNOSIS — N186 End stage renal disease: Secondary | ICD-10-CM | POA: Diagnosis not present

## 2016-08-15 DIAGNOSIS — D631 Anemia in chronic kidney disease: Secondary | ICD-10-CM | POA: Diagnosis not present

## 2016-08-17 DIAGNOSIS — D509 Iron deficiency anemia, unspecified: Secondary | ICD-10-CM | POA: Diagnosis not present

## 2016-08-17 DIAGNOSIS — N186 End stage renal disease: Secondary | ICD-10-CM | POA: Diagnosis not present

## 2016-08-17 DIAGNOSIS — N2581 Secondary hyperparathyroidism of renal origin: Secondary | ICD-10-CM | POA: Diagnosis not present

## 2016-08-17 DIAGNOSIS — D631 Anemia in chronic kidney disease: Secondary | ICD-10-CM | POA: Diagnosis not present

## 2016-08-17 DIAGNOSIS — E1151 Type 2 diabetes mellitus with diabetic peripheral angiopathy without gangrene: Secondary | ICD-10-CM | POA: Diagnosis not present

## 2016-08-19 DIAGNOSIS — E1151 Type 2 diabetes mellitus with diabetic peripheral angiopathy without gangrene: Secondary | ICD-10-CM | POA: Diagnosis not present

## 2016-08-19 DIAGNOSIS — D509 Iron deficiency anemia, unspecified: Secondary | ICD-10-CM | POA: Diagnosis not present

## 2016-08-19 DIAGNOSIS — N2581 Secondary hyperparathyroidism of renal origin: Secondary | ICD-10-CM | POA: Diagnosis not present

## 2016-08-19 DIAGNOSIS — N186 End stage renal disease: Secondary | ICD-10-CM | POA: Diagnosis not present

## 2016-08-19 DIAGNOSIS — D631 Anemia in chronic kidney disease: Secondary | ICD-10-CM | POA: Diagnosis not present

## 2016-08-22 DIAGNOSIS — D631 Anemia in chronic kidney disease: Secondary | ICD-10-CM | POA: Diagnosis not present

## 2016-08-22 DIAGNOSIS — N2581 Secondary hyperparathyroidism of renal origin: Secondary | ICD-10-CM | POA: Diagnosis not present

## 2016-08-22 DIAGNOSIS — N186 End stage renal disease: Secondary | ICD-10-CM | POA: Diagnosis not present

## 2016-08-22 DIAGNOSIS — D509 Iron deficiency anemia, unspecified: Secondary | ICD-10-CM | POA: Diagnosis not present

## 2016-08-22 DIAGNOSIS — E1151 Type 2 diabetes mellitus with diabetic peripheral angiopathy without gangrene: Secondary | ICD-10-CM | POA: Diagnosis not present

## 2016-08-24 DIAGNOSIS — D509 Iron deficiency anemia, unspecified: Secondary | ICD-10-CM | POA: Diagnosis not present

## 2016-08-24 DIAGNOSIS — N186 End stage renal disease: Secondary | ICD-10-CM | POA: Diagnosis not present

## 2016-08-24 DIAGNOSIS — D631 Anemia in chronic kidney disease: Secondary | ICD-10-CM | POA: Diagnosis not present

## 2016-08-24 DIAGNOSIS — N2581 Secondary hyperparathyroidism of renal origin: Secondary | ICD-10-CM | POA: Diagnosis not present

## 2016-08-24 DIAGNOSIS — E1151 Type 2 diabetes mellitus with diabetic peripheral angiopathy without gangrene: Secondary | ICD-10-CM | POA: Diagnosis not present

## 2016-08-26 DIAGNOSIS — D509 Iron deficiency anemia, unspecified: Secondary | ICD-10-CM | POA: Diagnosis not present

## 2016-08-26 DIAGNOSIS — N186 End stage renal disease: Secondary | ICD-10-CM | POA: Diagnosis not present

## 2016-08-26 DIAGNOSIS — T8620 Unspecified complication of heart transplant: Secondary | ICD-10-CM | POA: Diagnosis not present

## 2016-08-26 DIAGNOSIS — E1151 Type 2 diabetes mellitus with diabetic peripheral angiopathy without gangrene: Secondary | ICD-10-CM | POA: Diagnosis not present

## 2016-08-26 DIAGNOSIS — N2581 Secondary hyperparathyroidism of renal origin: Secondary | ICD-10-CM | POA: Diagnosis not present

## 2016-08-26 DIAGNOSIS — D631 Anemia in chronic kidney disease: Secondary | ICD-10-CM | POA: Diagnosis not present

## 2016-08-26 DIAGNOSIS — Z992 Dependence on renal dialysis: Secondary | ICD-10-CM | POA: Diagnosis not present

## 2016-08-29 DIAGNOSIS — D631 Anemia in chronic kidney disease: Secondary | ICD-10-CM | POA: Diagnosis not present

## 2016-08-29 DIAGNOSIS — N186 End stage renal disease: Secondary | ICD-10-CM | POA: Diagnosis not present

## 2016-08-29 DIAGNOSIS — E1151 Type 2 diabetes mellitus with diabetic peripheral angiopathy without gangrene: Secondary | ICD-10-CM | POA: Diagnosis not present

## 2016-08-29 DIAGNOSIS — N2581 Secondary hyperparathyroidism of renal origin: Secondary | ICD-10-CM | POA: Diagnosis not present

## 2016-08-29 DIAGNOSIS — D509 Iron deficiency anemia, unspecified: Secondary | ICD-10-CM | POA: Diagnosis not present

## 2016-08-31 DIAGNOSIS — N2581 Secondary hyperparathyroidism of renal origin: Secondary | ICD-10-CM | POA: Diagnosis not present

## 2016-08-31 DIAGNOSIS — E1151 Type 2 diabetes mellitus with diabetic peripheral angiopathy without gangrene: Secondary | ICD-10-CM | POA: Diagnosis not present

## 2016-08-31 DIAGNOSIS — D631 Anemia in chronic kidney disease: Secondary | ICD-10-CM | POA: Diagnosis not present

## 2016-08-31 DIAGNOSIS — N186 End stage renal disease: Secondary | ICD-10-CM | POA: Diagnosis not present

## 2016-08-31 DIAGNOSIS — D509 Iron deficiency anemia, unspecified: Secondary | ICD-10-CM | POA: Diagnosis not present

## 2016-09-03 ENCOUNTER — Ambulatory Visit (INDEPENDENT_AMBULATORY_CARE_PROVIDER_SITE_OTHER): Payer: Medicare Other

## 2016-09-03 ENCOUNTER — Encounter: Payer: Self-pay | Admitting: Podiatry

## 2016-09-03 ENCOUNTER — Ambulatory Visit (INDEPENDENT_AMBULATORY_CARE_PROVIDER_SITE_OTHER): Payer: Medicare Other | Admitting: Podiatry

## 2016-09-03 VITALS — BP 98/56 | HR 84 | Resp 16 | Ht 79.0 in | Wt 350.0 lb

## 2016-09-03 DIAGNOSIS — Z472 Encounter for removal of internal fixation device: Secondary | ICD-10-CM | POA: Diagnosis not present

## 2016-09-03 DIAGNOSIS — M79675 Pain in left toe(s): Secondary | ICD-10-CM

## 2016-09-03 DIAGNOSIS — N186 End stage renal disease: Secondary | ICD-10-CM | POA: Diagnosis not present

## 2016-09-03 DIAGNOSIS — D631 Anemia in chronic kidney disease: Secondary | ICD-10-CM | POA: Diagnosis not present

## 2016-09-03 DIAGNOSIS — N2581 Secondary hyperparathyroidism of renal origin: Secondary | ICD-10-CM | POA: Diagnosis not present

## 2016-09-03 DIAGNOSIS — D509 Iron deficiency anemia, unspecified: Secondary | ICD-10-CM | POA: Diagnosis not present

## 2016-09-03 DIAGNOSIS — E1151 Type 2 diabetes mellitus with diabetic peripheral angiopathy without gangrene: Secondary | ICD-10-CM | POA: Diagnosis not present

## 2016-09-03 MED ORDER — CEPHALEXIN 500 MG PO CAPS: 500.0000 mg | ORAL_CAPSULE | Freq: Three times a day (TID) | ORAL | 1 refills | Status: DC

## 2016-09-03 NOTE — Progress Notes (Signed)
Chief Complaint  Patient presents with  . Nail Problem    Left foot, 4th toe, nail is bloody and painful x 1 week.   . Toe Pain    Left foot; 4th toe; toe is swollen and painfu x 1 weekl. Pt states that Dr.P done surgery on the 2nd, 3rd and 4th toes "years ago", pt has pins in some of these toes and he's concerned that the 4th toe is infected due to a pin.

## 2016-09-05 DIAGNOSIS — N186 End stage renal disease: Secondary | ICD-10-CM | POA: Diagnosis not present

## 2016-09-05 DIAGNOSIS — D509 Iron deficiency anemia, unspecified: Secondary | ICD-10-CM | POA: Diagnosis not present

## 2016-09-05 DIAGNOSIS — D631 Anemia in chronic kidney disease: Secondary | ICD-10-CM | POA: Diagnosis not present

## 2016-09-05 DIAGNOSIS — E1151 Type 2 diabetes mellitus with diabetic peripheral angiopathy without gangrene: Secondary | ICD-10-CM | POA: Diagnosis not present

## 2016-09-05 DIAGNOSIS — N2581 Secondary hyperparathyroidism of renal origin: Secondary | ICD-10-CM | POA: Diagnosis not present

## 2016-09-05 NOTE — Progress Notes (Signed)
Subjective:     Patient ID: [name], male   DOB: 04/23/1961, 56 y.o.   MRN: [id number]  HPI patient states she started develop a lot of discomfort in the fourth digit of his left foot and states and states that he didn't lose the screw from his left second toe and he has a feeling this may be a problem with the screw in the fourth toe   Review of Systems     Objective:   Physical Exam Neurovascular status intact muscle strength was adequate range of motion within normal limits with patient noted to have damaged left fourth nail with what appears to be protrusion of the screw that may have backed out fourth toe    Assessment:     Abnormal fixation fourth digit left with nail trauma also present    Plan:     H&P conditions reviewed and I went ahead today and recommended screw removal. I anesthetized the digit with 60 Milligan times like Marcaine mixture I reviewed x-rays with patient and that I explained procedure and risk associated with screw removal. Patient wants this performed and under sterile conditions with sterile technique utilizing a sterile nail clipper I did excise first the nail over top of the screw found prominent screw and utilizing a screwdriver remove the screw in toto and flushed the wound. Applied sterile dressing instructed on soaks and will reappoint in the next several weeks or earlier if any issues should occur  X-ray report indicated screw in the fourth digit left which was probably creating this irritation

## 2016-09-06 DIAGNOSIS — M1611 Unilateral primary osteoarthritis, right hip: Secondary | ICD-10-CM | POA: Diagnosis not present

## 2016-09-07 ENCOUNTER — Ambulatory Visit: Payer: Medicare Other | Admitting: Podiatry

## 2016-09-07 DIAGNOSIS — D509 Iron deficiency anemia, unspecified: Secondary | ICD-10-CM | POA: Diagnosis not present

## 2016-09-07 DIAGNOSIS — E1151 Type 2 diabetes mellitus with diabetic peripheral angiopathy without gangrene: Secondary | ICD-10-CM | POA: Diagnosis not present

## 2016-09-07 DIAGNOSIS — N186 End stage renal disease: Secondary | ICD-10-CM | POA: Diagnosis not present

## 2016-09-07 DIAGNOSIS — D631 Anemia in chronic kidney disease: Secondary | ICD-10-CM | POA: Diagnosis not present

## 2016-09-07 DIAGNOSIS — N2581 Secondary hyperparathyroidism of renal origin: Secondary | ICD-10-CM | POA: Diagnosis not present

## 2016-09-10 DIAGNOSIS — D509 Iron deficiency anemia, unspecified: Secondary | ICD-10-CM | POA: Diagnosis not present

## 2016-09-10 DIAGNOSIS — D631 Anemia in chronic kidney disease: Secondary | ICD-10-CM | POA: Diagnosis not present

## 2016-09-10 DIAGNOSIS — N2581 Secondary hyperparathyroidism of renal origin: Secondary | ICD-10-CM | POA: Diagnosis not present

## 2016-09-10 DIAGNOSIS — E1151 Type 2 diabetes mellitus with diabetic peripheral angiopathy without gangrene: Secondary | ICD-10-CM | POA: Diagnosis not present

## 2016-09-10 DIAGNOSIS — N186 End stage renal disease: Secondary | ICD-10-CM | POA: Diagnosis not present

## 2016-09-12 DIAGNOSIS — N2581 Secondary hyperparathyroidism of renal origin: Secondary | ICD-10-CM | POA: Diagnosis not present

## 2016-09-12 DIAGNOSIS — D509 Iron deficiency anemia, unspecified: Secondary | ICD-10-CM | POA: Diagnosis not present

## 2016-09-12 DIAGNOSIS — N186 End stage renal disease: Secondary | ICD-10-CM | POA: Diagnosis not present

## 2016-09-12 DIAGNOSIS — E1151 Type 2 diabetes mellitus with diabetic peripheral angiopathy without gangrene: Secondary | ICD-10-CM | POA: Diagnosis not present

## 2016-09-12 DIAGNOSIS — D631 Anemia in chronic kidney disease: Secondary | ICD-10-CM | POA: Diagnosis not present

## 2016-09-14 DIAGNOSIS — E1151 Type 2 diabetes mellitus with diabetic peripheral angiopathy without gangrene: Secondary | ICD-10-CM | POA: Diagnosis not present

## 2016-09-14 DIAGNOSIS — N186 End stage renal disease: Secondary | ICD-10-CM | POA: Diagnosis not present

## 2016-09-14 DIAGNOSIS — N2581 Secondary hyperparathyroidism of renal origin: Secondary | ICD-10-CM | POA: Diagnosis not present

## 2016-09-14 DIAGNOSIS — D509 Iron deficiency anemia, unspecified: Secondary | ICD-10-CM | POA: Diagnosis not present

## 2016-09-14 DIAGNOSIS — D631 Anemia in chronic kidney disease: Secondary | ICD-10-CM | POA: Diagnosis not present

## 2016-09-17 DIAGNOSIS — D509 Iron deficiency anemia, unspecified: Secondary | ICD-10-CM | POA: Diagnosis not present

## 2016-09-17 DIAGNOSIS — N2581 Secondary hyperparathyroidism of renal origin: Secondary | ICD-10-CM | POA: Diagnosis not present

## 2016-09-17 DIAGNOSIS — N186 End stage renal disease: Secondary | ICD-10-CM | POA: Diagnosis not present

## 2016-09-17 DIAGNOSIS — E1151 Type 2 diabetes mellitus with diabetic peripheral angiopathy without gangrene: Secondary | ICD-10-CM | POA: Diagnosis not present

## 2016-09-17 DIAGNOSIS — D631 Anemia in chronic kidney disease: Secondary | ICD-10-CM | POA: Diagnosis not present

## 2016-09-19 DIAGNOSIS — E1151 Type 2 diabetes mellitus with diabetic peripheral angiopathy without gangrene: Secondary | ICD-10-CM | POA: Diagnosis not present

## 2016-09-19 DIAGNOSIS — E1122 Type 2 diabetes mellitus with diabetic chronic kidney disease: Secondary | ICD-10-CM | POA: Diagnosis not present

## 2016-09-19 DIAGNOSIS — D509 Iron deficiency anemia, unspecified: Secondary | ICD-10-CM | POA: Diagnosis not present

## 2016-09-19 DIAGNOSIS — D631 Anemia in chronic kidney disease: Secondary | ICD-10-CM | POA: Diagnosis not present

## 2016-09-19 DIAGNOSIS — N186 End stage renal disease: Secondary | ICD-10-CM | POA: Diagnosis not present

## 2016-09-19 DIAGNOSIS — N2581 Secondary hyperparathyroidism of renal origin: Secondary | ICD-10-CM | POA: Diagnosis not present

## 2016-09-19 DIAGNOSIS — E232 Diabetes insipidus: Secondary | ICD-10-CM | POA: Diagnosis not present

## 2016-09-21 DIAGNOSIS — N186 End stage renal disease: Secondary | ICD-10-CM | POA: Diagnosis not present

## 2016-09-21 DIAGNOSIS — N2581 Secondary hyperparathyroidism of renal origin: Secondary | ICD-10-CM | POA: Diagnosis not present

## 2016-09-21 DIAGNOSIS — D631 Anemia in chronic kidney disease: Secondary | ICD-10-CM | POA: Diagnosis not present

## 2016-09-21 DIAGNOSIS — D509 Iron deficiency anemia, unspecified: Secondary | ICD-10-CM | POA: Diagnosis not present

## 2016-09-21 DIAGNOSIS — E1151 Type 2 diabetes mellitus with diabetic peripheral angiopathy without gangrene: Secondary | ICD-10-CM | POA: Diagnosis not present

## 2016-09-24 DIAGNOSIS — D631 Anemia in chronic kidney disease: Secondary | ICD-10-CM | POA: Diagnosis not present

## 2016-09-24 DIAGNOSIS — E1151 Type 2 diabetes mellitus with diabetic peripheral angiopathy without gangrene: Secondary | ICD-10-CM | POA: Diagnosis not present

## 2016-09-24 DIAGNOSIS — N2581 Secondary hyperparathyroidism of renal origin: Secondary | ICD-10-CM | POA: Diagnosis not present

## 2016-09-24 DIAGNOSIS — N186 End stage renal disease: Secondary | ICD-10-CM | POA: Diagnosis not present

## 2016-09-24 DIAGNOSIS — D509 Iron deficiency anemia, unspecified: Secondary | ICD-10-CM | POA: Diagnosis not present

## 2016-09-26 DIAGNOSIS — E1151 Type 2 diabetes mellitus with diabetic peripheral angiopathy without gangrene: Secondary | ICD-10-CM | POA: Diagnosis not present

## 2016-09-26 DIAGNOSIS — T8620 Unspecified complication of heart transplant: Secondary | ICD-10-CM | POA: Diagnosis not present

## 2016-09-26 DIAGNOSIS — D631 Anemia in chronic kidney disease: Secondary | ICD-10-CM | POA: Diagnosis not present

## 2016-09-26 DIAGNOSIS — N2581 Secondary hyperparathyroidism of renal origin: Secondary | ICD-10-CM | POA: Diagnosis not present

## 2016-09-26 DIAGNOSIS — Z992 Dependence on renal dialysis: Secondary | ICD-10-CM | POA: Diagnosis not present

## 2016-09-26 DIAGNOSIS — D509 Iron deficiency anemia, unspecified: Secondary | ICD-10-CM | POA: Diagnosis not present

## 2016-09-26 DIAGNOSIS — N186 End stage renal disease: Secondary | ICD-10-CM | POA: Diagnosis not present

## 2016-09-28 DIAGNOSIS — D631 Anemia in chronic kidney disease: Secondary | ICD-10-CM | POA: Diagnosis not present

## 2016-09-28 DIAGNOSIS — E1151 Type 2 diabetes mellitus with diabetic peripheral angiopathy without gangrene: Secondary | ICD-10-CM | POA: Diagnosis not present

## 2016-09-28 DIAGNOSIS — N186 End stage renal disease: Secondary | ICD-10-CM | POA: Diagnosis not present

## 2016-09-28 DIAGNOSIS — N2581 Secondary hyperparathyroidism of renal origin: Secondary | ICD-10-CM | POA: Diagnosis not present

## 2016-10-01 DIAGNOSIS — N186 End stage renal disease: Secondary | ICD-10-CM | POA: Diagnosis not present

## 2016-10-01 DIAGNOSIS — N2581 Secondary hyperparathyroidism of renal origin: Secondary | ICD-10-CM | POA: Diagnosis not present

## 2016-10-01 DIAGNOSIS — E1151 Type 2 diabetes mellitus with diabetic peripheral angiopathy without gangrene: Secondary | ICD-10-CM | POA: Diagnosis not present

## 2016-10-01 DIAGNOSIS — D631 Anemia in chronic kidney disease: Secondary | ICD-10-CM | POA: Diagnosis not present

## 2016-10-03 DIAGNOSIS — E1151 Type 2 diabetes mellitus with diabetic peripheral angiopathy without gangrene: Secondary | ICD-10-CM | POA: Diagnosis not present

## 2016-10-03 DIAGNOSIS — D631 Anemia in chronic kidney disease: Secondary | ICD-10-CM | POA: Diagnosis not present

## 2016-10-03 DIAGNOSIS — N2581 Secondary hyperparathyroidism of renal origin: Secondary | ICD-10-CM | POA: Diagnosis not present

## 2016-10-03 DIAGNOSIS — N186 End stage renal disease: Secondary | ICD-10-CM | POA: Diagnosis not present

## 2016-10-04 DIAGNOSIS — Z79899 Other long term (current) drug therapy: Secondary | ICD-10-CM | POA: Diagnosis not present

## 2016-10-04 DIAGNOSIS — I517 Cardiomegaly: Secondary | ICD-10-CM | POA: Diagnosis not present

## 2016-10-04 DIAGNOSIS — Z6841 Body Mass Index (BMI) 40.0 and over, adult: Secondary | ICD-10-CM | POA: Diagnosis not present

## 2016-10-04 DIAGNOSIS — N186 End stage renal disease: Secondary | ICD-10-CM | POA: Diagnosis not present

## 2016-10-04 DIAGNOSIS — I340 Nonrheumatic mitral (valve) insufficiency: Secondary | ICD-10-CM | POA: Diagnosis not present

## 2016-10-04 DIAGNOSIS — R972 Elevated prostate specific antigen [PSA]: Secondary | ICD-10-CM | POA: Diagnosis not present

## 2016-10-04 DIAGNOSIS — Z941 Heart transplant status: Secondary | ICD-10-CM | POA: Diagnosis not present

## 2016-10-04 DIAGNOSIS — Z48298 Encounter for aftercare following other organ transplant: Secondary | ICD-10-CM | POA: Diagnosis not present

## 2016-10-04 DIAGNOSIS — Z4821 Encounter for aftercare following heart transplant: Secondary | ICD-10-CM | POA: Diagnosis not present

## 2016-10-04 DIAGNOSIS — I371 Nonrheumatic pulmonary valve insufficiency: Secondary | ICD-10-CM | POA: Diagnosis not present

## 2016-10-04 DIAGNOSIS — Z125 Encounter for screening for malignant neoplasm of prostate: Secondary | ICD-10-CM | POA: Diagnosis not present

## 2016-10-05 DIAGNOSIS — N2581 Secondary hyperparathyroidism of renal origin: Secondary | ICD-10-CM | POA: Diagnosis not present

## 2016-10-05 DIAGNOSIS — N186 End stage renal disease: Secondary | ICD-10-CM | POA: Diagnosis not present

## 2016-10-05 DIAGNOSIS — D631 Anemia in chronic kidney disease: Secondary | ICD-10-CM | POA: Diagnosis not present

## 2016-10-05 DIAGNOSIS — E1151 Type 2 diabetes mellitus with diabetic peripheral angiopathy without gangrene: Secondary | ICD-10-CM | POA: Diagnosis not present

## 2016-10-08 DIAGNOSIS — N186 End stage renal disease: Secondary | ICD-10-CM | POA: Diagnosis not present

## 2016-10-08 DIAGNOSIS — N2581 Secondary hyperparathyroidism of renal origin: Secondary | ICD-10-CM | POA: Diagnosis not present

## 2016-10-08 DIAGNOSIS — E1151 Type 2 diabetes mellitus with diabetic peripheral angiopathy without gangrene: Secondary | ICD-10-CM | POA: Diagnosis not present

## 2016-10-08 DIAGNOSIS — D631 Anemia in chronic kidney disease: Secondary | ICD-10-CM | POA: Diagnosis not present

## 2016-10-10 DIAGNOSIS — E1151 Type 2 diabetes mellitus with diabetic peripheral angiopathy without gangrene: Secondary | ICD-10-CM | POA: Diagnosis not present

## 2016-10-10 DIAGNOSIS — N186 End stage renal disease: Secondary | ICD-10-CM | POA: Diagnosis not present

## 2016-10-10 DIAGNOSIS — D631 Anemia in chronic kidney disease: Secondary | ICD-10-CM | POA: Diagnosis not present

## 2016-10-10 DIAGNOSIS — N2581 Secondary hyperparathyroidism of renal origin: Secondary | ICD-10-CM | POA: Diagnosis not present

## 2016-10-11 DIAGNOSIS — N186 End stage renal disease: Secondary | ICD-10-CM | POA: Diagnosis not present

## 2016-10-11 DIAGNOSIS — Z941 Heart transplant status: Secondary | ICD-10-CM | POA: Diagnosis not present

## 2016-10-11 DIAGNOSIS — Z992 Dependence on renal dialysis: Secondary | ICD-10-CM | POA: Diagnosis not present

## 2016-10-11 DIAGNOSIS — Z48298 Encounter for aftercare following other organ transplant: Secondary | ICD-10-CM | POA: Diagnosis not present

## 2016-10-11 DIAGNOSIS — Z79899 Other long term (current) drug therapy: Secondary | ICD-10-CM | POA: Diagnosis not present

## 2016-10-11 DIAGNOSIS — I871 Compression of vein: Secondary | ICD-10-CM | POA: Diagnosis not present

## 2016-10-11 DIAGNOSIS — T82858A Stenosis of vascular prosthetic devices, implants and grafts, initial encounter: Secondary | ICD-10-CM | POA: Diagnosis not present

## 2016-10-12 DIAGNOSIS — N186 End stage renal disease: Secondary | ICD-10-CM | POA: Diagnosis not present

## 2016-10-12 DIAGNOSIS — E1151 Type 2 diabetes mellitus with diabetic peripheral angiopathy without gangrene: Secondary | ICD-10-CM | POA: Diagnosis not present

## 2016-10-12 DIAGNOSIS — D631 Anemia in chronic kidney disease: Secondary | ICD-10-CM | POA: Diagnosis not present

## 2016-10-12 DIAGNOSIS — N2581 Secondary hyperparathyroidism of renal origin: Secondary | ICD-10-CM | POA: Diagnosis not present

## 2016-10-15 DIAGNOSIS — N186 End stage renal disease: Secondary | ICD-10-CM | POA: Diagnosis not present

## 2016-10-15 DIAGNOSIS — N2581 Secondary hyperparathyroidism of renal origin: Secondary | ICD-10-CM | POA: Diagnosis not present

## 2016-10-15 DIAGNOSIS — E1151 Type 2 diabetes mellitus with diabetic peripheral angiopathy without gangrene: Secondary | ICD-10-CM | POA: Diagnosis not present

## 2016-10-15 DIAGNOSIS — D631 Anemia in chronic kidney disease: Secondary | ICD-10-CM | POA: Diagnosis not present

## 2016-10-17 DIAGNOSIS — N2581 Secondary hyperparathyroidism of renal origin: Secondary | ICD-10-CM | POA: Diagnosis not present

## 2016-10-17 DIAGNOSIS — E1151 Type 2 diabetes mellitus with diabetic peripheral angiopathy without gangrene: Secondary | ICD-10-CM | POA: Diagnosis not present

## 2016-10-17 DIAGNOSIS — N186 End stage renal disease: Secondary | ICD-10-CM | POA: Diagnosis not present

## 2016-10-17 DIAGNOSIS — D631 Anemia in chronic kidney disease: Secondary | ICD-10-CM | POA: Diagnosis not present

## 2016-10-19 DIAGNOSIS — E1151 Type 2 diabetes mellitus with diabetic peripheral angiopathy without gangrene: Secondary | ICD-10-CM | POA: Diagnosis not present

## 2016-10-19 DIAGNOSIS — D631 Anemia in chronic kidney disease: Secondary | ICD-10-CM | POA: Diagnosis not present

## 2016-10-19 DIAGNOSIS — N2581 Secondary hyperparathyroidism of renal origin: Secondary | ICD-10-CM | POA: Diagnosis not present

## 2016-10-19 DIAGNOSIS — N186 End stage renal disease: Secondary | ICD-10-CM | POA: Diagnosis not present

## 2016-10-22 DIAGNOSIS — D631 Anemia in chronic kidney disease: Secondary | ICD-10-CM | POA: Diagnosis not present

## 2016-10-22 DIAGNOSIS — N2581 Secondary hyperparathyroidism of renal origin: Secondary | ICD-10-CM | POA: Diagnosis not present

## 2016-10-22 DIAGNOSIS — E1151 Type 2 diabetes mellitus with diabetic peripheral angiopathy without gangrene: Secondary | ICD-10-CM | POA: Diagnosis not present

## 2016-10-22 DIAGNOSIS — N186 End stage renal disease: Secondary | ICD-10-CM | POA: Diagnosis not present

## 2016-10-24 DIAGNOSIS — N2581 Secondary hyperparathyroidism of renal origin: Secondary | ICD-10-CM | POA: Diagnosis not present

## 2016-10-24 DIAGNOSIS — Z992 Dependence on renal dialysis: Secondary | ICD-10-CM | POA: Diagnosis not present

## 2016-10-24 DIAGNOSIS — D631 Anemia in chronic kidney disease: Secondary | ICD-10-CM | POA: Diagnosis not present

## 2016-10-24 DIAGNOSIS — N186 End stage renal disease: Secondary | ICD-10-CM | POA: Diagnosis not present

## 2016-10-24 DIAGNOSIS — E1151 Type 2 diabetes mellitus with diabetic peripheral angiopathy without gangrene: Secondary | ICD-10-CM | POA: Diagnosis not present

## 2016-10-24 DIAGNOSIS — T8620 Unspecified complication of heart transplant: Secondary | ICD-10-CM | POA: Diagnosis not present

## 2016-10-25 DIAGNOSIS — Z01818 Encounter for other preprocedural examination: Secondary | ICD-10-CM | POA: Diagnosis not present

## 2016-10-25 DIAGNOSIS — Z941 Heart transplant status: Secondary | ICD-10-CM | POA: Diagnosis not present

## 2016-10-25 DIAGNOSIS — I120 Hypertensive chronic kidney disease with stage 5 chronic kidney disease or end stage renal disease: Secondary | ICD-10-CM | POA: Diagnosis not present

## 2016-10-25 DIAGNOSIS — Z794 Long term (current) use of insulin: Secondary | ICD-10-CM | POA: Diagnosis not present

## 2016-10-25 DIAGNOSIS — E1122 Type 2 diabetes mellitus with diabetic chronic kidney disease: Secondary | ICD-10-CM | POA: Diagnosis not present

## 2016-10-25 DIAGNOSIS — K219 Gastro-esophageal reflux disease without esophagitis: Secondary | ICD-10-CM | POA: Diagnosis not present

## 2016-10-25 DIAGNOSIS — N186 End stage renal disease: Secondary | ICD-10-CM | POA: Diagnosis not present

## 2016-10-25 DIAGNOSIS — I4510 Unspecified right bundle-branch block: Secondary | ICD-10-CM | POA: Diagnosis not present

## 2016-10-25 DIAGNOSIS — I10 Essential (primary) hypertension: Secondary | ICD-10-CM | POA: Diagnosis not present

## 2016-10-25 DIAGNOSIS — Z7982 Long term (current) use of aspirin: Secondary | ICD-10-CM | POA: Diagnosis not present

## 2016-10-25 DIAGNOSIS — E1100 Type 2 diabetes mellitus with hyperosmolarity without nonketotic hyperglycemic-hyperosmolar coma (NKHHC): Secondary | ICD-10-CM | POA: Diagnosis not present

## 2016-10-25 DIAGNOSIS — Z8673 Personal history of transient ischemic attack (TIA), and cerebral infarction without residual deficits: Secondary | ICD-10-CM | POA: Diagnosis not present

## 2016-10-25 DIAGNOSIS — Z992 Dependence on renal dialysis: Secondary | ICD-10-CM | POA: Diagnosis not present

## 2016-10-25 DIAGNOSIS — F419 Anxiety disorder, unspecified: Secondary | ICD-10-CM | POA: Diagnosis not present

## 2016-10-25 DIAGNOSIS — Z6841 Body Mass Index (BMI) 40.0 and over, adult: Secondary | ICD-10-CM | POA: Diagnosis not present

## 2016-10-25 DIAGNOSIS — Z713 Dietary counseling and surveillance: Secondary | ICD-10-CM | POA: Diagnosis not present

## 2016-10-25 DIAGNOSIS — Z9884 Bariatric surgery status: Secondary | ICD-10-CM | POA: Diagnosis not present

## 2016-10-25 DIAGNOSIS — G4730 Sleep apnea, unspecified: Secondary | ICD-10-CM | POA: Diagnosis not present

## 2016-10-27 DIAGNOSIS — N2581 Secondary hyperparathyroidism of renal origin: Secondary | ICD-10-CM | POA: Diagnosis not present

## 2016-10-27 DIAGNOSIS — D631 Anemia in chronic kidney disease: Secondary | ICD-10-CM | POA: Diagnosis not present

## 2016-10-27 DIAGNOSIS — N186 End stage renal disease: Secondary | ICD-10-CM | POA: Diagnosis not present

## 2016-10-27 DIAGNOSIS — E8770 Fluid overload, unspecified: Secondary | ICD-10-CM | POA: Diagnosis not present

## 2016-10-30 DIAGNOSIS — N186 End stage renal disease: Secondary | ICD-10-CM | POA: Diagnosis not present

## 2016-10-30 DIAGNOSIS — N2581 Secondary hyperparathyroidism of renal origin: Secondary | ICD-10-CM | POA: Diagnosis not present

## 2016-10-30 DIAGNOSIS — D631 Anemia in chronic kidney disease: Secondary | ICD-10-CM | POA: Diagnosis not present

## 2016-10-30 DIAGNOSIS — E8770 Fluid overload, unspecified: Secondary | ICD-10-CM | POA: Diagnosis not present

## 2016-10-31 DIAGNOSIS — N186 End stage renal disease: Secondary | ICD-10-CM | POA: Diagnosis not present

## 2016-10-31 DIAGNOSIS — D631 Anemia in chronic kidney disease: Secondary | ICD-10-CM | POA: Diagnosis not present

## 2016-10-31 DIAGNOSIS — E8770 Fluid overload, unspecified: Secondary | ICD-10-CM | POA: Diagnosis not present

## 2016-10-31 DIAGNOSIS — N2581 Secondary hyperparathyroidism of renal origin: Secondary | ICD-10-CM | POA: Diagnosis not present

## 2016-11-01 DIAGNOSIS — N186 End stage renal disease: Secondary | ICD-10-CM | POA: Diagnosis not present

## 2016-11-01 DIAGNOSIS — N2581 Secondary hyperparathyroidism of renal origin: Secondary | ICD-10-CM | POA: Diagnosis not present

## 2016-11-01 DIAGNOSIS — D631 Anemia in chronic kidney disease: Secondary | ICD-10-CM | POA: Diagnosis not present

## 2016-11-01 DIAGNOSIS — E8770 Fluid overload, unspecified: Secondary | ICD-10-CM | POA: Diagnosis not present

## 2016-11-03 DIAGNOSIS — Z992 Dependence on renal dialysis: Secondary | ICD-10-CM | POA: Diagnosis not present

## 2016-11-03 DIAGNOSIS — N186 End stage renal disease: Secondary | ICD-10-CM | POA: Diagnosis not present

## 2016-11-05 DIAGNOSIS — N186 End stage renal disease: Secondary | ICD-10-CM | POA: Diagnosis not present

## 2016-11-05 DIAGNOSIS — E8770 Fluid overload, unspecified: Secondary | ICD-10-CM | POA: Diagnosis not present

## 2016-11-05 DIAGNOSIS — D631 Anemia in chronic kidney disease: Secondary | ICD-10-CM | POA: Diagnosis not present

## 2016-11-05 DIAGNOSIS — N2581 Secondary hyperparathyroidism of renal origin: Secondary | ICD-10-CM | POA: Diagnosis not present

## 2016-11-07 DIAGNOSIS — D631 Anemia in chronic kidney disease: Secondary | ICD-10-CM | POA: Diagnosis not present

## 2016-11-07 DIAGNOSIS — N186 End stage renal disease: Secondary | ICD-10-CM | POA: Diagnosis not present

## 2016-11-07 DIAGNOSIS — E8770 Fluid overload, unspecified: Secondary | ICD-10-CM | POA: Diagnosis not present

## 2016-11-07 DIAGNOSIS — N2581 Secondary hyperparathyroidism of renal origin: Secondary | ICD-10-CM | POA: Diagnosis not present

## 2016-11-09 DIAGNOSIS — N2581 Secondary hyperparathyroidism of renal origin: Secondary | ICD-10-CM | POA: Diagnosis not present

## 2016-11-09 DIAGNOSIS — N186 End stage renal disease: Secondary | ICD-10-CM | POA: Diagnosis not present

## 2016-11-09 DIAGNOSIS — D631 Anemia in chronic kidney disease: Secondary | ICD-10-CM | POA: Diagnosis not present

## 2016-11-09 DIAGNOSIS — E8770 Fluid overload, unspecified: Secondary | ICD-10-CM | POA: Diagnosis not present

## 2016-11-12 DIAGNOSIS — N2581 Secondary hyperparathyroidism of renal origin: Secondary | ICD-10-CM | POA: Diagnosis not present

## 2016-11-12 DIAGNOSIS — N186 End stage renal disease: Secondary | ICD-10-CM | POA: Diagnosis not present

## 2016-11-12 DIAGNOSIS — D631 Anemia in chronic kidney disease: Secondary | ICD-10-CM | POA: Diagnosis not present

## 2016-11-12 DIAGNOSIS — E8770 Fluid overload, unspecified: Secondary | ICD-10-CM | POA: Diagnosis not present

## 2016-11-14 DIAGNOSIS — I120 Hypertensive chronic kidney disease with stage 5 chronic kidney disease or end stage renal disease: Secondary | ICD-10-CM | POA: Diagnosis present

## 2016-11-14 DIAGNOSIS — E1122 Type 2 diabetes mellitus with diabetic chronic kidney disease: Secondary | ICD-10-CM | POA: Diagnosis present

## 2016-11-14 DIAGNOSIS — N250 Renal osteodystrophy: Secondary | ICD-10-CM | POA: Diagnosis present

## 2016-11-14 DIAGNOSIS — I10 Essential (primary) hypertension: Secondary | ICD-10-CM | POA: Diagnosis not present

## 2016-11-14 DIAGNOSIS — N2581 Secondary hyperparathyroidism of renal origin: Secondary | ICD-10-CM | POA: Diagnosis not present

## 2016-11-14 DIAGNOSIS — Z9884 Bariatric surgery status: Secondary | ICD-10-CM | POA: Diagnosis not present

## 2016-11-14 DIAGNOSIS — E8770 Fluid overload, unspecified: Secondary | ICD-10-CM | POA: Diagnosis not present

## 2016-11-14 DIAGNOSIS — Z8673 Personal history of transient ischemic attack (TIA), and cerebral infarction without residual deficits: Secondary | ICD-10-CM | POA: Diagnosis not present

## 2016-11-14 DIAGNOSIS — E119 Type 2 diabetes mellitus without complications: Secondary | ICD-10-CM | POA: Diagnosis not present

## 2016-11-14 DIAGNOSIS — Z941 Heart transplant status: Secondary | ICD-10-CM | POA: Diagnosis not present

## 2016-11-14 DIAGNOSIS — E1140 Type 2 diabetes mellitus with diabetic neuropathy, unspecified: Secondary | ICD-10-CM | POA: Diagnosis present

## 2016-11-14 DIAGNOSIS — Z992 Dependence on renal dialysis: Secondary | ICD-10-CM | POA: Diagnosis not present

## 2016-11-14 DIAGNOSIS — Z452 Encounter for adjustment and management of vascular access device: Secondary | ICD-10-CM | POA: Diagnosis not present

## 2016-11-14 DIAGNOSIS — Z79899 Other long term (current) drug therapy: Secondary | ICD-10-CM | POA: Diagnosis not present

## 2016-11-14 DIAGNOSIS — Z6841 Body Mass Index (BMI) 40.0 and over, adult: Secondary | ICD-10-CM | POA: Diagnosis not present

## 2016-11-14 DIAGNOSIS — D631 Anemia in chronic kidney disease: Secondary | ICD-10-CM | POA: Diagnosis present

## 2016-11-14 DIAGNOSIS — N186 End stage renal disease: Secondary | ICD-10-CM | POA: Diagnosis not present

## 2016-11-14 DIAGNOSIS — D680 Von Willebrand's disease: Secondary | ICD-10-CM | POA: Diagnosis present

## 2016-11-15 DIAGNOSIS — Z941 Heart transplant status: Secondary | ICD-10-CM | POA: Diagnosis not present

## 2016-11-19 DIAGNOSIS — E8770 Fluid overload, unspecified: Secondary | ICD-10-CM | POA: Diagnosis not present

## 2016-11-19 DIAGNOSIS — D631 Anemia in chronic kidney disease: Secondary | ICD-10-CM | POA: Diagnosis not present

## 2016-11-19 DIAGNOSIS — N186 End stage renal disease: Secondary | ICD-10-CM | POA: Diagnosis not present

## 2016-11-19 DIAGNOSIS — N2581 Secondary hyperparathyroidism of renal origin: Secondary | ICD-10-CM | POA: Diagnosis not present

## 2016-11-20 DIAGNOSIS — Z9884 Bariatric surgery status: Secondary | ICD-10-CM | POA: Diagnosis not present

## 2016-11-20 DIAGNOSIS — D72828 Other elevated white blood cell count: Secondary | ICD-10-CM | POA: Diagnosis not present

## 2016-11-20 DIAGNOSIS — M25551 Pain in right hip: Secondary | ICD-10-CM | POA: Diagnosis not present

## 2016-11-21 DIAGNOSIS — N186 End stage renal disease: Secondary | ICD-10-CM | POA: Diagnosis not present

## 2016-11-21 DIAGNOSIS — D631 Anemia in chronic kidney disease: Secondary | ICD-10-CM | POA: Diagnosis not present

## 2016-11-21 DIAGNOSIS — E8770 Fluid overload, unspecified: Secondary | ICD-10-CM | POA: Diagnosis not present

## 2016-11-21 DIAGNOSIS — N2581 Secondary hyperparathyroidism of renal origin: Secondary | ICD-10-CM | POA: Diagnosis not present

## 2016-11-23 DIAGNOSIS — N2581 Secondary hyperparathyroidism of renal origin: Secondary | ICD-10-CM | POA: Diagnosis not present

## 2016-11-23 DIAGNOSIS — E8770 Fluid overload, unspecified: Secondary | ICD-10-CM | POA: Diagnosis not present

## 2016-11-23 DIAGNOSIS — N186 End stage renal disease: Secondary | ICD-10-CM | POA: Diagnosis not present

## 2016-11-23 DIAGNOSIS — D631 Anemia in chronic kidney disease: Secondary | ICD-10-CM | POA: Diagnosis not present

## 2016-11-24 DIAGNOSIS — T8620 Unspecified complication of heart transplant: Secondary | ICD-10-CM | POA: Diagnosis not present

## 2016-11-24 DIAGNOSIS — N186 End stage renal disease: Secondary | ICD-10-CM | POA: Diagnosis not present

## 2016-11-24 DIAGNOSIS — Z992 Dependence on renal dialysis: Secondary | ICD-10-CM | POA: Diagnosis not present

## 2016-11-26 DIAGNOSIS — N2581 Secondary hyperparathyroidism of renal origin: Secondary | ICD-10-CM | POA: Diagnosis not present

## 2016-11-26 DIAGNOSIS — Z992 Dependence on renal dialysis: Secondary | ICD-10-CM | POA: Diagnosis not present

## 2016-11-26 DIAGNOSIS — N186 End stage renal disease: Secondary | ICD-10-CM | POA: Diagnosis not present

## 2016-11-28 DIAGNOSIS — E1129 Type 2 diabetes mellitus with other diabetic kidney complication: Secondary | ICD-10-CM | POA: Diagnosis not present

## 2016-11-28 DIAGNOSIS — D631 Anemia in chronic kidney disease: Secondary | ICD-10-CM | POA: Diagnosis not present

## 2016-11-28 DIAGNOSIS — N2581 Secondary hyperparathyroidism of renal origin: Secondary | ICD-10-CM | POA: Diagnosis not present

## 2016-11-28 DIAGNOSIS — N186 End stage renal disease: Secondary | ICD-10-CM | POA: Diagnosis not present

## 2016-11-30 DIAGNOSIS — D631 Anemia in chronic kidney disease: Secondary | ICD-10-CM | POA: Diagnosis not present

## 2016-11-30 DIAGNOSIS — N2581 Secondary hyperparathyroidism of renal origin: Secondary | ICD-10-CM | POA: Diagnosis not present

## 2016-11-30 DIAGNOSIS — N186 End stage renal disease: Secondary | ICD-10-CM | POA: Diagnosis not present

## 2016-11-30 DIAGNOSIS — E1129 Type 2 diabetes mellitus with other diabetic kidney complication: Secondary | ICD-10-CM | POA: Diagnosis not present

## 2016-12-03 ENCOUNTER — Encounter: Payer: Self-pay | Admitting: Neurology

## 2016-12-03 DIAGNOSIS — E1129 Type 2 diabetes mellitus with other diabetic kidney complication: Secondary | ICD-10-CM | POA: Diagnosis not present

## 2016-12-03 DIAGNOSIS — D631 Anemia in chronic kidney disease: Secondary | ICD-10-CM | POA: Diagnosis not present

## 2016-12-03 DIAGNOSIS — N2581 Secondary hyperparathyroidism of renal origin: Secondary | ICD-10-CM | POA: Diagnosis not present

## 2016-12-03 DIAGNOSIS — N186 End stage renal disease: Secondary | ICD-10-CM | POA: Diagnosis not present

## 2016-12-04 DIAGNOSIS — D72829 Elevated white blood cell count, unspecified: Secondary | ICD-10-CM | POA: Diagnosis not present

## 2016-12-04 DIAGNOSIS — Z992 Dependence on renal dialysis: Secondary | ICD-10-CM | POA: Diagnosis not present

## 2016-12-04 DIAGNOSIS — Z713 Dietary counseling and surveillance: Secondary | ICD-10-CM | POA: Diagnosis not present

## 2016-12-04 DIAGNOSIS — F4320 Adjustment disorder, unspecified: Secondary | ICD-10-CM | POA: Diagnosis not present

## 2016-12-04 DIAGNOSIS — N186 End stage renal disease: Secondary | ICD-10-CM | POA: Diagnosis not present

## 2016-12-04 DIAGNOSIS — Z941 Heart transplant status: Secondary | ICD-10-CM | POA: Diagnosis not present

## 2016-12-04 DIAGNOSIS — Z9884 Bariatric surgery status: Secondary | ICD-10-CM | POA: Diagnosis not present

## 2016-12-04 DIAGNOSIS — Z6839 Body mass index (BMI) 39.0-39.9, adult: Secondary | ICD-10-CM | POA: Diagnosis not present

## 2016-12-04 DIAGNOSIS — Z48815 Encounter for surgical aftercare following surgery on the digestive system: Secondary | ICD-10-CM | POA: Diagnosis not present

## 2016-12-05 DIAGNOSIS — D631 Anemia in chronic kidney disease: Secondary | ICD-10-CM | POA: Diagnosis not present

## 2016-12-05 DIAGNOSIS — E1129 Type 2 diabetes mellitus with other diabetic kidney complication: Secondary | ICD-10-CM | POA: Diagnosis not present

## 2016-12-05 DIAGNOSIS — N186 End stage renal disease: Secondary | ICD-10-CM | POA: Diagnosis not present

## 2016-12-05 DIAGNOSIS — N2581 Secondary hyperparathyroidism of renal origin: Secondary | ICD-10-CM | POA: Diagnosis not present

## 2016-12-07 DIAGNOSIS — N2581 Secondary hyperparathyroidism of renal origin: Secondary | ICD-10-CM | POA: Diagnosis not present

## 2016-12-07 DIAGNOSIS — E1129 Type 2 diabetes mellitus with other diabetic kidney complication: Secondary | ICD-10-CM | POA: Diagnosis not present

## 2016-12-07 DIAGNOSIS — D631 Anemia in chronic kidney disease: Secondary | ICD-10-CM | POA: Diagnosis not present

## 2016-12-07 DIAGNOSIS — N186 End stage renal disease: Secondary | ICD-10-CM | POA: Diagnosis not present

## 2016-12-10 DIAGNOSIS — E1129 Type 2 diabetes mellitus with other diabetic kidney complication: Secondary | ICD-10-CM | POA: Diagnosis not present

## 2016-12-10 DIAGNOSIS — N186 End stage renal disease: Secondary | ICD-10-CM | POA: Diagnosis not present

## 2016-12-10 DIAGNOSIS — N2581 Secondary hyperparathyroidism of renal origin: Secondary | ICD-10-CM | POA: Diagnosis not present

## 2016-12-10 DIAGNOSIS — D631 Anemia in chronic kidney disease: Secondary | ICD-10-CM | POA: Diagnosis not present

## 2016-12-12 DIAGNOSIS — D631 Anemia in chronic kidney disease: Secondary | ICD-10-CM | POA: Diagnosis not present

## 2016-12-12 DIAGNOSIS — E1129 Type 2 diabetes mellitus with other diabetic kidney complication: Secondary | ICD-10-CM | POA: Diagnosis not present

## 2016-12-12 DIAGNOSIS — N2581 Secondary hyperparathyroidism of renal origin: Secondary | ICD-10-CM | POA: Diagnosis not present

## 2016-12-12 DIAGNOSIS — N186 End stage renal disease: Secondary | ICD-10-CM | POA: Diagnosis not present

## 2016-12-14 DIAGNOSIS — D631 Anemia in chronic kidney disease: Secondary | ICD-10-CM | POA: Diagnosis not present

## 2016-12-14 DIAGNOSIS — N186 End stage renal disease: Secondary | ICD-10-CM | POA: Diagnosis not present

## 2016-12-14 DIAGNOSIS — E1129 Type 2 diabetes mellitus with other diabetic kidney complication: Secondary | ICD-10-CM | POA: Diagnosis not present

## 2016-12-14 DIAGNOSIS — N2581 Secondary hyperparathyroidism of renal origin: Secondary | ICD-10-CM | POA: Diagnosis not present

## 2016-12-17 DIAGNOSIS — N186 End stage renal disease: Secondary | ICD-10-CM | POA: Diagnosis not present

## 2016-12-17 DIAGNOSIS — D631 Anemia in chronic kidney disease: Secondary | ICD-10-CM | POA: Diagnosis not present

## 2016-12-17 DIAGNOSIS — N2581 Secondary hyperparathyroidism of renal origin: Secondary | ICD-10-CM | POA: Diagnosis not present

## 2016-12-17 DIAGNOSIS — E1129 Type 2 diabetes mellitus with other diabetic kidney complication: Secondary | ICD-10-CM | POA: Diagnosis not present

## 2016-12-19 DIAGNOSIS — D631 Anemia in chronic kidney disease: Secondary | ICD-10-CM | POA: Diagnosis not present

## 2016-12-19 DIAGNOSIS — N2581 Secondary hyperparathyroidism of renal origin: Secondary | ICD-10-CM | POA: Diagnosis not present

## 2016-12-19 DIAGNOSIS — E1129 Type 2 diabetes mellitus with other diabetic kidney complication: Secondary | ICD-10-CM | POA: Diagnosis not present

## 2016-12-19 DIAGNOSIS — N186 End stage renal disease: Secondary | ICD-10-CM | POA: Diagnosis not present

## 2016-12-21 DIAGNOSIS — N2581 Secondary hyperparathyroidism of renal origin: Secondary | ICD-10-CM | POA: Diagnosis not present

## 2016-12-21 DIAGNOSIS — N186 End stage renal disease: Secondary | ICD-10-CM | POA: Diagnosis not present

## 2016-12-21 DIAGNOSIS — E1129 Type 2 diabetes mellitus with other diabetic kidney complication: Secondary | ICD-10-CM | POA: Diagnosis not present

## 2016-12-21 DIAGNOSIS — D631 Anemia in chronic kidney disease: Secondary | ICD-10-CM | POA: Diagnosis not present

## 2016-12-24 DIAGNOSIS — T8620 Unspecified complication of heart transplant: Secondary | ICD-10-CM | POA: Diagnosis not present

## 2016-12-24 DIAGNOSIS — N186 End stage renal disease: Secondary | ICD-10-CM | POA: Diagnosis not present

## 2016-12-24 DIAGNOSIS — N2581 Secondary hyperparathyroidism of renal origin: Secondary | ICD-10-CM | POA: Diagnosis not present

## 2016-12-24 DIAGNOSIS — E1129 Type 2 diabetes mellitus with other diabetic kidney complication: Secondary | ICD-10-CM | POA: Diagnosis not present

## 2016-12-24 DIAGNOSIS — Z992 Dependence on renal dialysis: Secondary | ICD-10-CM | POA: Diagnosis not present

## 2016-12-24 DIAGNOSIS — D631 Anemia in chronic kidney disease: Secondary | ICD-10-CM | POA: Diagnosis not present

## 2016-12-26 DIAGNOSIS — Z23 Encounter for immunization: Secondary | ICD-10-CM | POA: Diagnosis not present

## 2016-12-26 DIAGNOSIS — D631 Anemia in chronic kidney disease: Secondary | ICD-10-CM | POA: Diagnosis not present

## 2016-12-26 DIAGNOSIS — E1129 Type 2 diabetes mellitus with other diabetic kidney complication: Secondary | ICD-10-CM | POA: Diagnosis not present

## 2016-12-26 DIAGNOSIS — N186 End stage renal disease: Secondary | ICD-10-CM | POA: Diagnosis not present

## 2016-12-26 DIAGNOSIS — Z283 Underimmunization status: Secondary | ICD-10-CM | POA: Diagnosis not present

## 2016-12-26 DIAGNOSIS — N2581 Secondary hyperparathyroidism of renal origin: Secondary | ICD-10-CM | POA: Diagnosis not present

## 2016-12-28 DIAGNOSIS — E1129 Type 2 diabetes mellitus with other diabetic kidney complication: Secondary | ICD-10-CM | POA: Diagnosis not present

## 2016-12-28 DIAGNOSIS — Z23 Encounter for immunization: Secondary | ICD-10-CM | POA: Diagnosis not present

## 2016-12-28 DIAGNOSIS — N2581 Secondary hyperparathyroidism of renal origin: Secondary | ICD-10-CM | POA: Diagnosis not present

## 2016-12-28 DIAGNOSIS — N186 End stage renal disease: Secondary | ICD-10-CM | POA: Diagnosis not present

## 2016-12-28 DIAGNOSIS — Z283 Underimmunization status: Secondary | ICD-10-CM | POA: Diagnosis not present

## 2016-12-28 DIAGNOSIS — D631 Anemia in chronic kidney disease: Secondary | ICD-10-CM | POA: Diagnosis not present

## 2016-12-31 DIAGNOSIS — E1129 Type 2 diabetes mellitus with other diabetic kidney complication: Secondary | ICD-10-CM | POA: Diagnosis not present

## 2016-12-31 DIAGNOSIS — Z23 Encounter for immunization: Secondary | ICD-10-CM | POA: Diagnosis not present

## 2016-12-31 DIAGNOSIS — N2581 Secondary hyperparathyroidism of renal origin: Secondary | ICD-10-CM | POA: Diagnosis not present

## 2016-12-31 DIAGNOSIS — N186 End stage renal disease: Secondary | ICD-10-CM | POA: Diagnosis not present

## 2016-12-31 DIAGNOSIS — D631 Anemia in chronic kidney disease: Secondary | ICD-10-CM | POA: Diagnosis not present

## 2016-12-31 DIAGNOSIS — Z283 Underimmunization status: Secondary | ICD-10-CM | POA: Diagnosis not present

## 2017-01-02 DIAGNOSIS — Z283 Underimmunization status: Secondary | ICD-10-CM | POA: Diagnosis not present

## 2017-01-02 DIAGNOSIS — D631 Anemia in chronic kidney disease: Secondary | ICD-10-CM | POA: Diagnosis not present

## 2017-01-02 DIAGNOSIS — Z23 Encounter for immunization: Secondary | ICD-10-CM | POA: Diagnosis not present

## 2017-01-02 DIAGNOSIS — N2581 Secondary hyperparathyroidism of renal origin: Secondary | ICD-10-CM | POA: Diagnosis not present

## 2017-01-02 DIAGNOSIS — N186 End stage renal disease: Secondary | ICD-10-CM | POA: Diagnosis not present

## 2017-01-02 DIAGNOSIS — E1129 Type 2 diabetes mellitus with other diabetic kidney complication: Secondary | ICD-10-CM | POA: Diagnosis not present

## 2017-01-03 ENCOUNTER — Other Ambulatory Visit (INDEPENDENT_AMBULATORY_CARE_PROVIDER_SITE_OTHER): Payer: Medicare Other

## 2017-01-03 ENCOUNTER — Encounter: Payer: Self-pay | Admitting: Neurology

## 2017-01-03 ENCOUNTER — Ambulatory Visit (INDEPENDENT_AMBULATORY_CARE_PROVIDER_SITE_OTHER): Payer: Medicare Other | Admitting: Neurology

## 2017-01-03 VITALS — BP 120/70 | HR 74 | Ht 78.0 in | Wt 341.1 lb

## 2017-01-03 DIAGNOSIS — N186 End stage renal disease: Secondary | ICD-10-CM | POA: Diagnosis not present

## 2017-01-03 DIAGNOSIS — M792 Neuralgia and neuritis, unspecified: Secondary | ICD-10-CM | POA: Diagnosis not present

## 2017-01-03 DIAGNOSIS — Z992 Dependence on renal dialysis: Secondary | ICD-10-CM | POA: Diagnosis not present

## 2017-01-03 DIAGNOSIS — N189 Chronic kidney disease, unspecified: Secondary | ICD-10-CM | POA: Diagnosis not present

## 2017-01-03 DIAGNOSIS — G63 Polyneuropathy in diseases classified elsewhere: Secondary | ICD-10-CM | POA: Diagnosis not present

## 2017-01-03 LAB — TSH: TSH: 1.74 u[IU]/mL (ref 0.35–4.50)

## 2017-01-03 LAB — FOLATE: Folate: 21.7 ng/mL (ref >5.9)

## 2017-01-03 LAB — VITAMIN B12: Vitamin B-12: 910 pg/mL (ref 211–911)

## 2017-01-03 MED ORDER — VENLAFAXINE HCL 37.5 MG PO TABS: ORAL_TABLET | ORAL | 5 refills | Status: DC

## 2017-01-03 MED ORDER — LIDOCAINE 5 % EX OINT: 1.0000 "application " | TOPICAL_OINTMENT | CUTANEOUS | 5 refills | Status: DC | PRN

## 2017-01-03 NOTE — Progress Notes (Signed)
[facility] [place] [facility] Clinic Note - Initial Visit   Date: 01/03/17  [name] MRN: [id number] DOB: 10-21-1960   Dear Dr. Marval Regal:  Thank you for your kind referral of [name] for consultation of neuropathic pain. Although his history is well known to you, please allow [place] to reiterate it for the purpose of our medical record. The patient was accompanied to the clinic by self.   History of Present Illness: [name] is a 56 y.o. right-handed African American male with orthotopic heart transplant (2014), end-stage renal disease (MWF), hypertension, OSA, diabetes type 2, hyperlipidemia, GERD, stroke (occurred while on LVAD, mild left residual weakness), s/p gastric sleeve (10/2016) presenting for evaluation of numbness of the hands and feet.    He started dialysis in 2014 after his immunosuppressive medication caused nephrotoxic injury. Since this time has noticed increased tingling after each of his dialysis session involving the lower legs to his feet and fingertips. He endorses having tingling/numbness of the mid-foot bilaterally during non-dialysis days, but the intensity is much less.  He has noticed that tingling is not severe during the first three hours of dialysis, but within the 4th hour and afterwards, the pain becomes intense and severe and involves his lower legs.  Pain is sharp and shooting down his legs.  He was started on Lyrica 100mg  twice daily in January, which helps some.  He denies any weakness or falls.  He has some imbalance.    He has dialysis [facility].  He is anuric.  He is on immunosuppressive therapy with cyclosporine and mycophenolate.  He is no longer on mediation for diabetes.  No history of alcohol use.  No family history of neuropathy.  Out-side paper records, electronic medical record, and images have been reviewed where available and summarized as:  Labs 12/19/2016 hemoglobin A1c 5.3 EKG 11/14/2016:  QTc 517*  Past Medical  History:  Diagnosis Date  . Atrial fibrillation ([place])   . CHF (congestive heart failure), NYHA class III ([place])    s/p BiV ICD and LVAD  . Chronic systolic dysfunction of left ventricle   . CVA (cerebral infarction)   . Diabetes mellitus   . Family history of coronary artery disease    in both parents  . Hyperlipidemia   . Hypertension   . Left bundle branch block   . Morbid obesity ([place])    status post lap band  . Nonischemic cardiomyopathy ([facility])    Est. EF of 15% to 20%, s/p BiV ICD and LVAD, on [facility] list  . Obstructive sleep apnea   . Premature ventricular contractions   . SOB (shortness of breath)   . Stroke [facility])     Past Surgical History:  Procedure Laterality Date  . CARDIAC PACEMAKER PLACEMENT  09/21/2009   Biventricular implantable cardioverter-defibrillator implantation     . FOOT SURGERY     left  . HEART TRANSPLANT  2014  . LAPAROSCOPIC GASTRIC BANDING  01/27/2007  . LEFT VENTRICULAR ASSIST DEVICE     implanted at [facility]     Medications:  Outpatient Encounter Prescriptions as of 01/03/2017  Medication Sig Note  . aspirin 81 MG tablet Take 81 mg by mouth daily.   . B Complex-C-Folic Acid (RENO CAPS) 1 MG CAPS TAKE 1 CAPSULE BY MOUTH ONCE DAILY. 09/03/2016: Received from: [place]  . cephALEXin (KEFLEX) 500 MG capsule Take 1 capsule (500 mg total) by mouth 3 (three) times daily.   . cinacalcet (SENSIPAR) 30  MG tablet Take by mouth. 09/03/2016: Received from: [place]: Take 30 mg by mouth once daily.   . citalopram (CELEXA) 20 MG tablet TAKE 1 TABLET(20 MG) BY MOUTH EVERY DAY 09/03/2016: Received from: [facility]  . cycloSPORINE modified (NEORAL) 100 MG capsule TK ONE C PO  BID 09/03/2016: Received from: [facility]  . LYRICA 100 MG capsule TK 1 C PO BID   . mycophenolate (MYFORTIC) 360 MG TBEC EC tablet TAKE 2 TABLETS BY MOUTH TWICE DAILY 09/03/2016: Received from: [facility]  . omeprazole (PRILOSEC) 20 MG capsule TAKE 1 CAPSULE BY MOUTH DAILY 09/03/2016: Received from: [facility]  . ondansetron (ZOFRAN) 4 MG tablet Take 1 tablet by mouth every 12 hours as needed for nausea 09/03/2016: Received from: [facility]  . pravastatin (PRAVACHOL) 20 MG tablet Take 20 mg by mouth daily.   . sevelamer carbonate (RENVELA) 800 MG tablet Take by mouth. 09/03/2016: Received from: [place]: Take 800 mg by mouth once daily.  . temazepam (RESTORIL) 15 MG capsule Take by mouth. 09/03/2016: Received from: [facility]: Take 1 capsule (15 mg total) by mouth nightly as needed for Sleep.   No facility-administered encounter medications on file as of 01/03/2017.      Allergies:  Allergies  Allergen Reactions  . Heparin Other (See Comments)    Unknown reaction this year  . Penicillin G Potassium  [Penicillin G] Other (See Comments)    Family History: Family History  Problem Relation Age of Onset  . Coronary artery disease Father        had, PTCA & CABG  . Heart attack Father   . Hypertension Father   . Diabetes Father   . Coronary artery disease Mother   . Heart attack Mother   . Hypertension Mother   . Stroke Mother   . Lupus Mother     Social History: Social History  Substance Use Topics  . Smoking status: Former Smoker    Types: Cigars  . Smokeless tobacco: Never Used  . Alcohol use No   Social History   Social History Narrative   Lives with wife in a one story home.  Has one child.  Retired from the [employer].  Education: college.     Review of Systems:  CONSTITUTIONAL: No fevers, chills, night sweats, +intentional weight loss.   EYES: No visual changes or eye pain ENT: No hearing changes.  No history of nose bleeds.   RESPIRATORY: No cough, wheezing and shortness of breath.   CARDIOVASCULAR: Negative for chest pain, and  palpitations.   GI: Negative for abdominal discomfort, blood in stools or black stools.  No recent change in bowel habits.   GU:  No history of incontinence.   MUSCLOSKELETAL: No history of joint pain or swelling.  No myalgias.   SKIN: Negative for lesions, rash, and itching.   HEMATOLOGY/ONCOLOGY: Negative for prolonged bleeding, bruising easily, and swollen nodes.   ENDOCRINE: Negative for cold or heat intolerance, polydipsia or goiter.   PSYCH:  No depression or anxiety symptoms.   NEURO: As Above.   Vital Signs:  BP 120/70   Pulse 74   Ht 6\' 6"  (1.981 m)   Wt (!) 341 lb 1 oz (154.7 kg)   SpO2 93%   BMI 39.41 kg/m    General Medical Exam:   General:  Well appearing, comfortable.  Eyes/ENT: see cranial nerve examination.   Neck: No masses appreciated.  Full range of motion without tenderness.  No carotid bruits. Respiratory:  Clear to auscultation, good air entry bilaterally.   Cardiac:  Regular rate and rhythm, no murmur.   Extremities:  Right forearm AV fistula.  Neurological Exam: MENTAL STATUS including orientation to time, place, person, recent and remote memory, attention span and concentration, language, and fund of knowledge is normal.  Speech is not dysarthric.  CRANIAL NERVES: II:  No visual field defects.  Unremarkable fundi.   III-IV-VI: Pupils equal round and reactive to light.  Normal conjugate, extra-ocular eye movements in all directions of gaze.  No nystagmus.  No ptosis.   V:  Normal facial sensation.   VII:  Mild flattening of the left nasolabial fold with smile.    VIII:  Normal hearing and vestibular function.   IX-X:  Normal palatal movement.   XI:  Normal shoulder shrug and head rotation.   XII:  Normal tongue strength and range of motion, no deviation or fasciculation.  MOTOR:  No atrophy, fasciculations or abnormal movements.  No pronator drift.  Tone is normal.    Right Upper Extremity:    Left Upper Extremity:    Deltoid  5/5   Deltoid  5-/5     Biceps  5/5   Biceps  5-/5   Triceps  5/5   Triceps  5/5   Wrist extensors  5/5   Wrist extensors  5/5   Wrist flexors  5/5   Wrist flexors  5/5   Finger extensors  5/5   Finger extensors  5/5   Finger flexors  5/5   Finger flexors  5/5   Dorsal interossei  5/5   Dorsal interossei  5-/5   Abductor pollicis  5/5   Abductor pollicis  5/5   Tone (Ashworth scale)  0  Tone (Ashworth scale)  0   Right Lower Extremity:    Left Lower Extremity:    Hip flexors  5/5   Hip flexors  5/5   Hip extensors  5/5   Hip extensors  5/5   Knee flexors  5/5   Knee flexors  5/5   Knee extensors  5/5   Knee extensors  5/5   Dorsiflexors  5/5   Dorsiflexors  5/5   Plantarflexors  5/5   Plantarflexors  5/5   Toe extensors  5/5   Toe extensors  5/5   Toe flexors  5/5   Toe flexors  5/5   Tone (Ashworth scale)  0  Tone (Ashworth scale)  0   MSRs:  Right                                                                 Left brachioradialis 2+  brachioradialis 3+  biceps 2+  biceps 3+  triceps 2+  triceps 3+  patellar 2+  patellar 3+  ankle jerk 0  ankle jerk 0  Hoffman no  Hoffman no  plantar response down  plantar response down   SENSORY:  Vibration is absent at the great toe bilaterally.  Pin prick and temperature is reduced over the feet bilaterally.   COORDINATION/GAIT: Normal finger-to- nose-finger.  Intact rapid alternating movements bilaterally. Gait wide-based and stable, unassisted.  IMPRESSION: 1.  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  Patient also experiences intense neuropathic pain during the last hour of his dialysis which persists for up to 24 hours following dialysis.  This is atypical for neuropathy and I wonder if there is any specific substrate within his dialysis concentration which would exacerbate his neuropathy during dialysis.  I would kindly defer to his nephrologist whether any adjustments to his dialysis composition and/or electrolytes could  help with this.   2.  Neuropathic pain due to #1.  We are unable to use TCA due to QTc prolongation.  Cymbalta is not recommended in dialysis patients.  Options are limited given his other medical comorbidities of cardiac and renal disease, as well as being on immunosuppressive medication.  I will offer a trial of venlafaxine for this pain. We can consider gabapentin going forward to replace Lyrica, but at this time, he seems to be having some benefit with Lyrica.  Unfortunately, if his pain persists, he may not have many non-narcotic options.    3.  History of ischemic stroke while on LVAD with residual mild left sided weakness  PLAN/RECOMMENDATIONS:  1.  Check TSH, vitamin B12, folate, copper, vitamin B1 2.  Start effexor 37.5 (half-tablet) for one month, then increase to 1 tablet daily. 3.  Start lidocaine ointment to feet as needed 4.  Continue Lyrica 100mg  twice daily 5.  I will request his nephrologist to consider whether any changes can be made to his dialysis composition which could diminish the intensity of his neuropathic pain that occurs immediately following dialysis  Return to clinic in 4 months.   The duration of this appointment visit was 50 minutes of face-to-face time with the patient.  Greater than 50% of this time was spent in counseling, explanation of diagnosis, planning of further management, and coordination of care.   Thank you for allowing me to participate in patient's care.  If I can answer any additional questions, I would be pleased to do so.    Sincerely,    Kainat Pizana K. Posey Pronto, DO

## 2017-01-03 NOTE — Patient Instructions (Addendum)
1.  Start effexor 37.5 (half-tablet) for one month, then increase to 1 tablet daily. 2.  You may also use lidocaine ointment to your feet 3.  Continue Lyrica 100mg  twice daily 4.  Check labs  Return to clinic in 4 months

## 2017-01-04 DIAGNOSIS — D631 Anemia in chronic kidney disease: Secondary | ICD-10-CM | POA: Diagnosis not present

## 2017-01-04 DIAGNOSIS — Z283 Underimmunization status: Secondary | ICD-10-CM | POA: Diagnosis not present

## 2017-01-04 DIAGNOSIS — N2581 Secondary hyperparathyroidism of renal origin: Secondary | ICD-10-CM | POA: Diagnosis not present

## 2017-01-04 DIAGNOSIS — N186 End stage renal disease: Secondary | ICD-10-CM | POA: Diagnosis not present

## 2017-01-04 DIAGNOSIS — E1129 Type 2 diabetes mellitus with other diabetic kidney complication: Secondary | ICD-10-CM | POA: Diagnosis not present

## 2017-01-04 DIAGNOSIS — Z23 Encounter for immunization: Secondary | ICD-10-CM | POA: Diagnosis not present

## 2017-01-05 LAB — COPPER, SERUM: COPPER: 145 ug/dL (ref 70–175)

## 2017-01-07 DIAGNOSIS — E1129 Type 2 diabetes mellitus with other diabetic kidney complication: Secondary | ICD-10-CM | POA: Diagnosis not present

## 2017-01-07 DIAGNOSIS — Z283 Underimmunization status: Secondary | ICD-10-CM | POA: Diagnosis not present

## 2017-01-07 DIAGNOSIS — N2581 Secondary hyperparathyroidism of renal origin: Secondary | ICD-10-CM | POA: Diagnosis not present

## 2017-01-07 DIAGNOSIS — Z23 Encounter for immunization: Secondary | ICD-10-CM | POA: Diagnosis not present

## 2017-01-07 DIAGNOSIS — N186 End stage renal disease: Secondary | ICD-10-CM | POA: Diagnosis not present

## 2017-01-07 DIAGNOSIS — D631 Anemia in chronic kidney disease: Secondary | ICD-10-CM | POA: Diagnosis not present

## 2017-01-08 ENCOUNTER — Telehealth: Payer: Self-pay | Admitting: *Deleted

## 2017-01-08 LAB — VITAMIN B1: Vitamin B1 (Thiamine): 13 nmol/L (ref 8–30)

## 2017-01-08 NOTE — Telephone Encounter (Signed)
-----   Message from Alda Berthold, DO sent at 01/08/2017  3:53 PM EDT ----- Please notify patient lab are within normal limits.  Thank you.

## 2017-01-08 NOTE — Telephone Encounter (Signed)
PATIENT NOTIFIED.

## 2017-01-09 DIAGNOSIS — N2581 Secondary hyperparathyroidism of renal origin: Secondary | ICD-10-CM | POA: Diagnosis not present

## 2017-01-09 DIAGNOSIS — E1129 Type 2 diabetes mellitus with other diabetic kidney complication: Secondary | ICD-10-CM | POA: Diagnosis not present

## 2017-01-09 DIAGNOSIS — D631 Anemia in chronic kidney disease: Secondary | ICD-10-CM | POA: Diagnosis not present

## 2017-01-09 DIAGNOSIS — Z23 Encounter for immunization: Secondary | ICD-10-CM | POA: Diagnosis not present

## 2017-01-09 DIAGNOSIS — Z283 Underimmunization status: Secondary | ICD-10-CM | POA: Diagnosis not present

## 2017-01-09 DIAGNOSIS — N186 End stage renal disease: Secondary | ICD-10-CM | POA: Diagnosis not present

## 2017-01-11 DIAGNOSIS — Z283 Underimmunization status: Secondary | ICD-10-CM | POA: Diagnosis not present

## 2017-01-11 DIAGNOSIS — N2581 Secondary hyperparathyroidism of renal origin: Secondary | ICD-10-CM | POA: Diagnosis not present

## 2017-01-11 DIAGNOSIS — E1129 Type 2 diabetes mellitus with other diabetic kidney complication: Secondary | ICD-10-CM | POA: Diagnosis not present

## 2017-01-11 DIAGNOSIS — D631 Anemia in chronic kidney disease: Secondary | ICD-10-CM | POA: Diagnosis not present

## 2017-01-11 DIAGNOSIS — Z23 Encounter for immunization: Secondary | ICD-10-CM | POA: Diagnosis not present

## 2017-01-11 DIAGNOSIS — N186 End stage renal disease: Secondary | ICD-10-CM | POA: Diagnosis not present

## 2017-01-14 DIAGNOSIS — E1129 Type 2 diabetes mellitus with other diabetic kidney complication: Secondary | ICD-10-CM | POA: Diagnosis not present

## 2017-01-14 DIAGNOSIS — D631 Anemia in chronic kidney disease: Secondary | ICD-10-CM | POA: Diagnosis not present

## 2017-01-14 DIAGNOSIS — Z23 Encounter for immunization: Secondary | ICD-10-CM | POA: Diagnosis not present

## 2017-01-14 DIAGNOSIS — N2581 Secondary hyperparathyroidism of renal origin: Secondary | ICD-10-CM | POA: Diagnosis not present

## 2017-01-14 DIAGNOSIS — N186 End stage renal disease: Secondary | ICD-10-CM | POA: Diagnosis not present

## 2017-01-14 DIAGNOSIS — Z283 Underimmunization status: Secondary | ICD-10-CM | POA: Diagnosis not present

## 2017-01-16 DIAGNOSIS — N2581 Secondary hyperparathyroidism of renal origin: Secondary | ICD-10-CM | POA: Diagnosis not present

## 2017-01-16 DIAGNOSIS — N186 End stage renal disease: Secondary | ICD-10-CM | POA: Diagnosis not present

## 2017-01-16 DIAGNOSIS — E1129 Type 2 diabetes mellitus with other diabetic kidney complication: Secondary | ICD-10-CM | POA: Diagnosis not present

## 2017-01-16 DIAGNOSIS — Z23 Encounter for immunization: Secondary | ICD-10-CM | POA: Diagnosis not present

## 2017-01-16 DIAGNOSIS — D631 Anemia in chronic kidney disease: Secondary | ICD-10-CM | POA: Diagnosis not present

## 2017-01-16 DIAGNOSIS — Z283 Underimmunization status: Secondary | ICD-10-CM | POA: Diagnosis not present

## 2017-01-18 DIAGNOSIS — D631 Anemia in chronic kidney disease: Secondary | ICD-10-CM | POA: Diagnosis not present

## 2017-01-18 DIAGNOSIS — Z283 Underimmunization status: Secondary | ICD-10-CM | POA: Diagnosis not present

## 2017-01-18 DIAGNOSIS — E1129 Type 2 diabetes mellitus with other diabetic kidney complication: Secondary | ICD-10-CM | POA: Diagnosis not present

## 2017-01-18 DIAGNOSIS — Z23 Encounter for immunization: Secondary | ICD-10-CM | POA: Diagnosis not present

## 2017-01-18 DIAGNOSIS — N186 End stage renal disease: Secondary | ICD-10-CM | POA: Diagnosis not present

## 2017-01-18 DIAGNOSIS — N2581 Secondary hyperparathyroidism of renal origin: Secondary | ICD-10-CM | POA: Diagnosis not present

## 2017-01-21 DIAGNOSIS — N186 End stage renal disease: Secondary | ICD-10-CM | POA: Diagnosis not present

## 2017-01-21 DIAGNOSIS — D631 Anemia in chronic kidney disease: Secondary | ICD-10-CM | POA: Diagnosis not present

## 2017-01-21 DIAGNOSIS — N2581 Secondary hyperparathyroidism of renal origin: Secondary | ICD-10-CM | POA: Diagnosis not present

## 2017-01-21 DIAGNOSIS — Z283 Underimmunization status: Secondary | ICD-10-CM | POA: Diagnosis not present

## 2017-01-21 DIAGNOSIS — Z23 Encounter for immunization: Secondary | ICD-10-CM | POA: Diagnosis not present

## 2017-01-21 DIAGNOSIS — E1129 Type 2 diabetes mellitus with other diabetic kidney complication: Secondary | ICD-10-CM | POA: Diagnosis not present

## 2017-01-23 DIAGNOSIS — Z283 Underimmunization status: Secondary | ICD-10-CM | POA: Diagnosis not present

## 2017-01-23 DIAGNOSIS — Z23 Encounter for immunization: Secondary | ICD-10-CM | POA: Diagnosis not present

## 2017-01-23 DIAGNOSIS — E1129 Type 2 diabetes mellitus with other diabetic kidney complication: Secondary | ICD-10-CM | POA: Diagnosis not present

## 2017-01-23 DIAGNOSIS — N2581 Secondary hyperparathyroidism of renal origin: Secondary | ICD-10-CM | POA: Diagnosis not present

## 2017-01-23 DIAGNOSIS — N186 End stage renal disease: Secondary | ICD-10-CM | POA: Diagnosis not present

## 2017-01-23 DIAGNOSIS — D631 Anemia in chronic kidney disease: Secondary | ICD-10-CM | POA: Diagnosis not present

## 2017-01-24 DIAGNOSIS — T8620 Unspecified complication of heart transplant: Secondary | ICD-10-CM | POA: Diagnosis not present

## 2017-01-24 DIAGNOSIS — N186 End stage renal disease: Secondary | ICD-10-CM | POA: Diagnosis not present

## 2017-01-24 DIAGNOSIS — Z992 Dependence on renal dialysis: Secondary | ICD-10-CM | POA: Diagnosis not present

## 2017-01-25 DIAGNOSIS — N186 End stage renal disease: Secondary | ICD-10-CM | POA: Diagnosis not present

## 2017-01-25 DIAGNOSIS — N2581 Secondary hyperparathyroidism of renal origin: Secondary | ICD-10-CM | POA: Diagnosis not present

## 2017-01-25 DIAGNOSIS — E1129 Type 2 diabetes mellitus with other diabetic kidney complication: Secondary | ICD-10-CM | POA: Diagnosis not present

## 2017-01-28 DIAGNOSIS — E1129 Type 2 diabetes mellitus with other diabetic kidney complication: Secondary | ICD-10-CM | POA: Diagnosis not present

## 2017-01-28 DIAGNOSIS — N186 End stage renal disease: Secondary | ICD-10-CM | POA: Diagnosis not present

## 2017-01-28 DIAGNOSIS — N2581 Secondary hyperparathyroidism of renal origin: Secondary | ICD-10-CM | POA: Diagnosis not present

## 2017-01-30 DIAGNOSIS — N186 End stage renal disease: Secondary | ICD-10-CM | POA: Diagnosis not present

## 2017-01-30 DIAGNOSIS — E1129 Type 2 diabetes mellitus with other diabetic kidney complication: Secondary | ICD-10-CM | POA: Diagnosis not present

## 2017-01-30 DIAGNOSIS — N2581 Secondary hyperparathyroidism of renal origin: Secondary | ICD-10-CM | POA: Diagnosis not present

## 2017-02-01 DIAGNOSIS — N186 End stage renal disease: Secondary | ICD-10-CM | POA: Diagnosis not present

## 2017-02-01 DIAGNOSIS — E1129 Type 2 diabetes mellitus with other diabetic kidney complication: Secondary | ICD-10-CM | POA: Diagnosis not present

## 2017-02-01 DIAGNOSIS — N2581 Secondary hyperparathyroidism of renal origin: Secondary | ICD-10-CM | POA: Diagnosis not present

## 2017-02-04 DIAGNOSIS — N2581 Secondary hyperparathyroidism of renal origin: Secondary | ICD-10-CM | POA: Diagnosis not present

## 2017-02-04 DIAGNOSIS — E1129 Type 2 diabetes mellitus with other diabetic kidney complication: Secondary | ICD-10-CM | POA: Diagnosis not present

## 2017-02-04 DIAGNOSIS — N186 End stage renal disease: Secondary | ICD-10-CM | POA: Diagnosis not present

## 2017-02-06 DIAGNOSIS — E1129 Type 2 diabetes mellitus with other diabetic kidney complication: Secondary | ICD-10-CM | POA: Diagnosis not present

## 2017-02-06 DIAGNOSIS — N186 End stage renal disease: Secondary | ICD-10-CM | POA: Diagnosis not present

## 2017-02-06 DIAGNOSIS — N2581 Secondary hyperparathyroidism of renal origin: Secondary | ICD-10-CM | POA: Diagnosis not present

## 2017-02-08 DIAGNOSIS — N2581 Secondary hyperparathyroidism of renal origin: Secondary | ICD-10-CM | POA: Diagnosis not present

## 2017-02-08 DIAGNOSIS — E1129 Type 2 diabetes mellitus with other diabetic kidney complication: Secondary | ICD-10-CM | POA: Diagnosis not present

## 2017-02-08 DIAGNOSIS — N186 End stage renal disease: Secondary | ICD-10-CM | POA: Diagnosis not present

## 2017-02-11 DIAGNOSIS — N2581 Secondary hyperparathyroidism of renal origin: Secondary | ICD-10-CM | POA: Diagnosis not present

## 2017-02-11 DIAGNOSIS — E1129 Type 2 diabetes mellitus with other diabetic kidney complication: Secondary | ICD-10-CM | POA: Diagnosis not present

## 2017-02-11 DIAGNOSIS — N186 End stage renal disease: Secondary | ICD-10-CM | POA: Diagnosis not present

## 2017-02-12 DIAGNOSIS — Z1211 Encounter for screening for malignant neoplasm of colon: Secondary | ICD-10-CM | POA: Diagnosis not present

## 2017-02-12 DIAGNOSIS — Z992 Dependence on renal dialysis: Secondary | ICD-10-CM | POA: Diagnosis not present

## 2017-02-12 DIAGNOSIS — Z941 Heart transplant status: Secondary | ICD-10-CM | POA: Diagnosis not present

## 2017-02-12 DIAGNOSIS — Z8673 Personal history of transient ischemic attack (TIA), and cerebral infarction without residual deficits: Secondary | ICD-10-CM | POA: Diagnosis not present

## 2017-02-12 DIAGNOSIS — Z880 Allergy status to penicillin: Secondary | ICD-10-CM | POA: Diagnosis not present

## 2017-02-12 DIAGNOSIS — Z79899 Other long term (current) drug therapy: Secondary | ICD-10-CM | POA: Diagnosis not present

## 2017-02-12 DIAGNOSIS — N186 End stage renal disease: Secondary | ICD-10-CM | POA: Diagnosis not present

## 2017-02-12 DIAGNOSIS — K644 Residual hemorrhoidal skin tags: Secondary | ICD-10-CM | POA: Diagnosis not present

## 2017-02-12 DIAGNOSIS — N179 Acute kidney failure, unspecified: Secondary | ICD-10-CM | POA: Diagnosis not present

## 2017-02-12 DIAGNOSIS — I120 Hypertensive chronic kidney disease with stage 5 chronic kidney disease or end stage renal disease: Secondary | ICD-10-CM | POA: Diagnosis not present

## 2017-02-12 DIAGNOSIS — K635 Polyp of colon: Secondary | ICD-10-CM | POA: Diagnosis not present

## 2017-02-12 DIAGNOSIS — Z7982 Long term (current) use of aspirin: Secondary | ICD-10-CM | POA: Diagnosis not present

## 2017-02-12 DIAGNOSIS — E1122 Type 2 diabetes mellitus with diabetic chronic kidney disease: Secondary | ICD-10-CM | POA: Diagnosis not present

## 2017-02-13 DIAGNOSIS — N2581 Secondary hyperparathyroidism of renal origin: Secondary | ICD-10-CM | POA: Diagnosis not present

## 2017-02-13 DIAGNOSIS — N186 End stage renal disease: Secondary | ICD-10-CM | POA: Diagnosis not present

## 2017-02-13 DIAGNOSIS — E1129 Type 2 diabetes mellitus with other diabetic kidney complication: Secondary | ICD-10-CM | POA: Diagnosis not present

## 2017-02-15 DIAGNOSIS — N2581 Secondary hyperparathyroidism of renal origin: Secondary | ICD-10-CM | POA: Diagnosis not present

## 2017-02-15 DIAGNOSIS — E1129 Type 2 diabetes mellitus with other diabetic kidney complication: Secondary | ICD-10-CM | POA: Diagnosis not present

## 2017-02-15 DIAGNOSIS — N186 End stage renal disease: Secondary | ICD-10-CM | POA: Diagnosis not present

## 2017-02-18 DIAGNOSIS — N2581 Secondary hyperparathyroidism of renal origin: Secondary | ICD-10-CM | POA: Diagnosis not present

## 2017-02-18 DIAGNOSIS — N186 End stage renal disease: Secondary | ICD-10-CM | POA: Diagnosis not present

## 2017-02-18 DIAGNOSIS — E1129 Type 2 diabetes mellitus with other diabetic kidney complication: Secondary | ICD-10-CM | POA: Diagnosis not present

## 2017-02-20 DIAGNOSIS — N2581 Secondary hyperparathyroidism of renal origin: Secondary | ICD-10-CM | POA: Diagnosis not present

## 2017-02-20 DIAGNOSIS — E1129 Type 2 diabetes mellitus with other diabetic kidney complication: Secondary | ICD-10-CM | POA: Diagnosis not present

## 2017-02-20 DIAGNOSIS — N186 End stage renal disease: Secondary | ICD-10-CM | POA: Diagnosis not present

## 2017-02-22 DIAGNOSIS — N2581 Secondary hyperparathyroidism of renal origin: Secondary | ICD-10-CM | POA: Diagnosis not present

## 2017-02-22 DIAGNOSIS — E1129 Type 2 diabetes mellitus with other diabetic kidney complication: Secondary | ICD-10-CM | POA: Diagnosis not present

## 2017-02-22 DIAGNOSIS — N186 End stage renal disease: Secondary | ICD-10-CM | POA: Diagnosis not present

## 2017-02-23 DIAGNOSIS — N186 End stage renal disease: Secondary | ICD-10-CM | POA: Diagnosis not present

## 2017-02-23 DIAGNOSIS — T8620 Unspecified complication of heart transplant: Secondary | ICD-10-CM | POA: Diagnosis not present

## 2017-02-23 DIAGNOSIS — Z992 Dependence on renal dialysis: Secondary | ICD-10-CM | POA: Diagnosis not present

## 2017-02-25 DIAGNOSIS — D631 Anemia in chronic kidney disease: Secondary | ICD-10-CM | POA: Diagnosis not present

## 2017-02-25 DIAGNOSIS — N2581 Secondary hyperparathyroidism of renal origin: Secondary | ICD-10-CM | POA: Diagnosis not present

## 2017-02-25 DIAGNOSIS — N186 End stage renal disease: Secondary | ICD-10-CM | POA: Diagnosis not present

## 2017-02-25 DIAGNOSIS — E1129 Type 2 diabetes mellitus with other diabetic kidney complication: Secondary | ICD-10-CM | POA: Diagnosis not present

## 2017-02-27 DIAGNOSIS — D631 Anemia in chronic kidney disease: Secondary | ICD-10-CM | POA: Diagnosis not present

## 2017-02-27 DIAGNOSIS — N186 End stage renal disease: Secondary | ICD-10-CM | POA: Diagnosis not present

## 2017-02-27 DIAGNOSIS — N2581 Secondary hyperparathyroidism of renal origin: Secondary | ICD-10-CM | POA: Diagnosis not present

## 2017-02-27 DIAGNOSIS — E1129 Type 2 diabetes mellitus with other diabetic kidney complication: Secondary | ICD-10-CM | POA: Diagnosis not present

## 2017-03-01 DIAGNOSIS — N186 End stage renal disease: Secondary | ICD-10-CM | POA: Diagnosis not present

## 2017-03-01 DIAGNOSIS — D631 Anemia in chronic kidney disease: Secondary | ICD-10-CM | POA: Diagnosis not present

## 2017-03-01 DIAGNOSIS — E1129 Type 2 diabetes mellitus with other diabetic kidney complication: Secondary | ICD-10-CM | POA: Diagnosis not present

## 2017-03-01 DIAGNOSIS — N2581 Secondary hyperparathyroidism of renal origin: Secondary | ICD-10-CM | POA: Diagnosis not present

## 2017-03-04 DIAGNOSIS — D631 Anemia in chronic kidney disease: Secondary | ICD-10-CM | POA: Diagnosis not present

## 2017-03-04 DIAGNOSIS — N2581 Secondary hyperparathyroidism of renal origin: Secondary | ICD-10-CM | POA: Diagnosis not present

## 2017-03-04 DIAGNOSIS — N186 End stage renal disease: Secondary | ICD-10-CM | POA: Diagnosis not present

## 2017-03-04 DIAGNOSIS — E1129 Type 2 diabetes mellitus with other diabetic kidney complication: Secondary | ICD-10-CM | POA: Diagnosis not present

## 2017-03-06 DIAGNOSIS — E1129 Type 2 diabetes mellitus with other diabetic kidney complication: Secondary | ICD-10-CM | POA: Diagnosis not present

## 2017-03-06 DIAGNOSIS — N2581 Secondary hyperparathyroidism of renal origin: Secondary | ICD-10-CM | POA: Diagnosis not present

## 2017-03-06 DIAGNOSIS — D631 Anemia in chronic kidney disease: Secondary | ICD-10-CM | POA: Diagnosis not present

## 2017-03-06 DIAGNOSIS — N186 End stage renal disease: Secondary | ICD-10-CM | POA: Diagnosis not present

## 2017-03-08 DIAGNOSIS — N186 End stage renal disease: Secondary | ICD-10-CM | POA: Diagnosis not present

## 2017-03-08 DIAGNOSIS — D631 Anemia in chronic kidney disease: Secondary | ICD-10-CM | POA: Diagnosis not present

## 2017-03-08 DIAGNOSIS — N2581 Secondary hyperparathyroidism of renal origin: Secondary | ICD-10-CM | POA: Diagnosis not present

## 2017-03-08 DIAGNOSIS — E1129 Type 2 diabetes mellitus with other diabetic kidney complication: Secondary | ICD-10-CM | POA: Diagnosis not present

## 2017-03-11 DIAGNOSIS — D631 Anemia in chronic kidney disease: Secondary | ICD-10-CM | POA: Diagnosis not present

## 2017-03-11 DIAGNOSIS — N2581 Secondary hyperparathyroidism of renal origin: Secondary | ICD-10-CM | POA: Diagnosis not present

## 2017-03-11 DIAGNOSIS — N186 End stage renal disease: Secondary | ICD-10-CM | POA: Diagnosis not present

## 2017-03-11 DIAGNOSIS — E1129 Type 2 diabetes mellitus with other diabetic kidney complication: Secondary | ICD-10-CM | POA: Diagnosis not present

## 2017-03-13 DIAGNOSIS — D631 Anemia in chronic kidney disease: Secondary | ICD-10-CM | POA: Diagnosis not present

## 2017-03-13 DIAGNOSIS — N186 End stage renal disease: Secondary | ICD-10-CM | POA: Diagnosis not present

## 2017-03-13 DIAGNOSIS — E1129 Type 2 diabetes mellitus with other diabetic kidney complication: Secondary | ICD-10-CM | POA: Diagnosis not present

## 2017-03-13 DIAGNOSIS — N2581 Secondary hyperparathyroidism of renal origin: Secondary | ICD-10-CM | POA: Diagnosis not present

## 2017-03-15 DIAGNOSIS — E1129 Type 2 diabetes mellitus with other diabetic kidney complication: Secondary | ICD-10-CM | POA: Diagnosis not present

## 2017-03-15 DIAGNOSIS — N2581 Secondary hyperparathyroidism of renal origin: Secondary | ICD-10-CM | POA: Diagnosis not present

## 2017-03-15 DIAGNOSIS — D631 Anemia in chronic kidney disease: Secondary | ICD-10-CM | POA: Diagnosis not present

## 2017-03-15 DIAGNOSIS — N186 End stage renal disease: Secondary | ICD-10-CM | POA: Diagnosis not present

## 2017-03-18 DIAGNOSIS — N186 End stage renal disease: Secondary | ICD-10-CM | POA: Diagnosis not present

## 2017-03-18 DIAGNOSIS — E1129 Type 2 diabetes mellitus with other diabetic kidney complication: Secondary | ICD-10-CM | POA: Diagnosis not present

## 2017-03-18 DIAGNOSIS — N2581 Secondary hyperparathyroidism of renal origin: Secondary | ICD-10-CM | POA: Diagnosis not present

## 2017-03-18 DIAGNOSIS — D631 Anemia in chronic kidney disease: Secondary | ICD-10-CM | POA: Diagnosis not present

## 2017-03-19 DIAGNOSIS — N2581 Secondary hyperparathyroidism of renal origin: Secondary | ICD-10-CM | POA: Diagnosis not present

## 2017-03-19 DIAGNOSIS — N186 End stage renal disease: Secondary | ICD-10-CM | POA: Diagnosis not present

## 2017-03-19 DIAGNOSIS — D631 Anemia in chronic kidney disease: Secondary | ICD-10-CM | POA: Diagnosis not present

## 2017-03-19 DIAGNOSIS — E1129 Type 2 diabetes mellitus with other diabetic kidney complication: Secondary | ICD-10-CM | POA: Diagnosis not present

## 2017-03-20 DIAGNOSIS — Z9884 Bariatric surgery status: Secondary | ICD-10-CM | POA: Diagnosis not present

## 2017-03-20 DIAGNOSIS — K912 Postsurgical malabsorption, not elsewhere classified: Secondary | ICD-10-CM | POA: Diagnosis not present

## 2017-03-20 DIAGNOSIS — I120 Hypertensive chronic kidney disease with stage 5 chronic kidney disease or end stage renal disease: Secondary | ICD-10-CM | POA: Diagnosis not present

## 2017-03-20 DIAGNOSIS — Z713 Dietary counseling and surveillance: Secondary | ICD-10-CM | POA: Diagnosis not present

## 2017-03-20 DIAGNOSIS — Z79899 Other long term (current) drug therapy: Secondary | ICD-10-CM | POA: Diagnosis not present

## 2017-03-20 DIAGNOSIS — Z6837 Body mass index (BMI) 37.0-37.9, adult: Secondary | ICD-10-CM | POA: Diagnosis not present

## 2017-03-20 DIAGNOSIS — Z992 Dependence on renal dialysis: Secondary | ICD-10-CM | POA: Diagnosis not present

## 2017-03-20 DIAGNOSIS — E669 Obesity, unspecified: Secondary | ICD-10-CM | POA: Diagnosis not present

## 2017-03-20 DIAGNOSIS — Z48815 Encounter for surgical aftercare following surgery on the digestive system: Secondary | ICD-10-CM | POA: Diagnosis not present

## 2017-03-20 DIAGNOSIS — N186 End stage renal disease: Secondary | ICD-10-CM | POA: Diagnosis not present

## 2017-03-22 DIAGNOSIS — N2581 Secondary hyperparathyroidism of renal origin: Secondary | ICD-10-CM | POA: Diagnosis not present

## 2017-03-22 DIAGNOSIS — N186 End stage renal disease: Secondary | ICD-10-CM | POA: Diagnosis not present

## 2017-03-22 DIAGNOSIS — E1129 Type 2 diabetes mellitus with other diabetic kidney complication: Secondary | ICD-10-CM | POA: Diagnosis not present

## 2017-03-22 DIAGNOSIS — D631 Anemia in chronic kidney disease: Secondary | ICD-10-CM | POA: Diagnosis not present

## 2017-03-25 DIAGNOSIS — E1129 Type 2 diabetes mellitus with other diabetic kidney complication: Secondary | ICD-10-CM | POA: Diagnosis not present

## 2017-03-25 DIAGNOSIS — N186 End stage renal disease: Secondary | ICD-10-CM | POA: Diagnosis not present

## 2017-03-25 DIAGNOSIS — N2581 Secondary hyperparathyroidism of renal origin: Secondary | ICD-10-CM | POA: Diagnosis not present

## 2017-03-25 DIAGNOSIS — D631 Anemia in chronic kidney disease: Secondary | ICD-10-CM | POA: Diagnosis not present

## 2017-03-26 DIAGNOSIS — Z992 Dependence on renal dialysis: Secondary | ICD-10-CM | POA: Diagnosis not present

## 2017-03-26 DIAGNOSIS — N186 End stage renal disease: Secondary | ICD-10-CM | POA: Diagnosis not present

## 2017-03-26 DIAGNOSIS — T8620 Unspecified complication of heart transplant: Secondary | ICD-10-CM | POA: Diagnosis not present

## 2017-03-27 DIAGNOSIS — D631 Anemia in chronic kidney disease: Secondary | ICD-10-CM | POA: Diagnosis not present

## 2017-03-27 DIAGNOSIS — N2581 Secondary hyperparathyroidism of renal origin: Secondary | ICD-10-CM | POA: Diagnosis not present

## 2017-03-27 DIAGNOSIS — N186 End stage renal disease: Secondary | ICD-10-CM | POA: Diagnosis not present

## 2017-03-27 DIAGNOSIS — E1129 Type 2 diabetes mellitus with other diabetic kidney complication: Secondary | ICD-10-CM | POA: Diagnosis not present

## 2017-03-29 DIAGNOSIS — N186 End stage renal disease: Secondary | ICD-10-CM | POA: Diagnosis not present

## 2017-03-29 DIAGNOSIS — E1129 Type 2 diabetes mellitus with other diabetic kidney complication: Secondary | ICD-10-CM | POA: Diagnosis not present

## 2017-03-29 DIAGNOSIS — N2581 Secondary hyperparathyroidism of renal origin: Secondary | ICD-10-CM | POA: Diagnosis not present

## 2017-03-29 DIAGNOSIS — D631 Anemia in chronic kidney disease: Secondary | ICD-10-CM | POA: Diagnosis not present

## 2017-04-01 DIAGNOSIS — N186 End stage renal disease: Secondary | ICD-10-CM | POA: Diagnosis not present

## 2017-04-01 DIAGNOSIS — N2581 Secondary hyperparathyroidism of renal origin: Secondary | ICD-10-CM | POA: Diagnosis not present

## 2017-04-01 DIAGNOSIS — D631 Anemia in chronic kidney disease: Secondary | ICD-10-CM | POA: Diagnosis not present

## 2017-04-01 DIAGNOSIS — E1129 Type 2 diabetes mellitus with other diabetic kidney complication: Secondary | ICD-10-CM | POA: Diagnosis not present

## 2017-04-03 DIAGNOSIS — D631 Anemia in chronic kidney disease: Secondary | ICD-10-CM | POA: Diagnosis not present

## 2017-04-03 DIAGNOSIS — N2581 Secondary hyperparathyroidism of renal origin: Secondary | ICD-10-CM | POA: Diagnosis not present

## 2017-04-03 DIAGNOSIS — E1129 Type 2 diabetes mellitus with other diabetic kidney complication: Secondary | ICD-10-CM | POA: Diagnosis not present

## 2017-04-03 DIAGNOSIS — N186 End stage renal disease: Secondary | ICD-10-CM | POA: Diagnosis not present

## 2017-04-05 DIAGNOSIS — N2581 Secondary hyperparathyroidism of renal origin: Secondary | ICD-10-CM | POA: Diagnosis not present

## 2017-04-05 DIAGNOSIS — E1129 Type 2 diabetes mellitus with other diabetic kidney complication: Secondary | ICD-10-CM | POA: Diagnosis not present

## 2017-04-05 DIAGNOSIS — D631 Anemia in chronic kidney disease: Secondary | ICD-10-CM | POA: Diagnosis not present

## 2017-04-05 DIAGNOSIS — N186 End stage renal disease: Secondary | ICD-10-CM | POA: Diagnosis not present

## 2017-04-08 DIAGNOSIS — E1129 Type 2 diabetes mellitus with other diabetic kidney complication: Secondary | ICD-10-CM | POA: Diagnosis not present

## 2017-04-08 DIAGNOSIS — N186 End stage renal disease: Secondary | ICD-10-CM | POA: Diagnosis not present

## 2017-04-08 DIAGNOSIS — D631 Anemia in chronic kidney disease: Secondary | ICD-10-CM | POA: Diagnosis not present

## 2017-04-08 DIAGNOSIS — N2581 Secondary hyperparathyroidism of renal origin: Secondary | ICD-10-CM | POA: Diagnosis not present

## 2017-04-10 DIAGNOSIS — D631 Anemia in chronic kidney disease: Secondary | ICD-10-CM | POA: Diagnosis not present

## 2017-04-10 DIAGNOSIS — E1129 Type 2 diabetes mellitus with other diabetic kidney complication: Secondary | ICD-10-CM | POA: Diagnosis not present

## 2017-04-10 DIAGNOSIS — N186 End stage renal disease: Secondary | ICD-10-CM | POA: Diagnosis not present

## 2017-04-10 DIAGNOSIS — N2581 Secondary hyperparathyroidism of renal origin: Secondary | ICD-10-CM | POA: Diagnosis not present

## 2017-04-12 DIAGNOSIS — N2581 Secondary hyperparathyroidism of renal origin: Secondary | ICD-10-CM | POA: Diagnosis not present

## 2017-04-12 DIAGNOSIS — N186 End stage renal disease: Secondary | ICD-10-CM | POA: Diagnosis not present

## 2017-04-12 DIAGNOSIS — E1129 Type 2 diabetes mellitus with other diabetic kidney complication: Secondary | ICD-10-CM | POA: Diagnosis not present

## 2017-04-12 DIAGNOSIS — D631 Anemia in chronic kidney disease: Secondary | ICD-10-CM | POA: Diagnosis not present

## 2017-04-15 DIAGNOSIS — E1129 Type 2 diabetes mellitus with other diabetic kidney complication: Secondary | ICD-10-CM | POA: Diagnosis not present

## 2017-04-15 DIAGNOSIS — N186 End stage renal disease: Secondary | ICD-10-CM | POA: Diagnosis not present

## 2017-04-15 DIAGNOSIS — D631 Anemia in chronic kidney disease: Secondary | ICD-10-CM | POA: Diagnosis not present

## 2017-04-15 DIAGNOSIS — N2581 Secondary hyperparathyroidism of renal origin: Secondary | ICD-10-CM | POA: Diagnosis not present

## 2017-04-17 DIAGNOSIS — D631 Anemia in chronic kidney disease: Secondary | ICD-10-CM | POA: Diagnosis not present

## 2017-04-17 DIAGNOSIS — N2581 Secondary hyperparathyroidism of renal origin: Secondary | ICD-10-CM | POA: Diagnosis not present

## 2017-04-17 DIAGNOSIS — E1129 Type 2 diabetes mellitus with other diabetic kidney complication: Secondary | ICD-10-CM | POA: Diagnosis not present

## 2017-04-17 DIAGNOSIS — N186 End stage renal disease: Secondary | ICD-10-CM | POA: Diagnosis not present

## 2017-04-19 DIAGNOSIS — D631 Anemia in chronic kidney disease: Secondary | ICD-10-CM | POA: Diagnosis not present

## 2017-04-19 DIAGNOSIS — N186 End stage renal disease: Secondary | ICD-10-CM | POA: Diagnosis not present

## 2017-04-19 DIAGNOSIS — E1129 Type 2 diabetes mellitus with other diabetic kidney complication: Secondary | ICD-10-CM | POA: Diagnosis not present

## 2017-04-19 DIAGNOSIS — N2581 Secondary hyperparathyroidism of renal origin: Secondary | ICD-10-CM | POA: Diagnosis not present

## 2017-04-22 DIAGNOSIS — N2581 Secondary hyperparathyroidism of renal origin: Secondary | ICD-10-CM | POA: Diagnosis not present

## 2017-04-22 DIAGNOSIS — D631 Anemia in chronic kidney disease: Secondary | ICD-10-CM | POA: Diagnosis not present

## 2017-04-22 DIAGNOSIS — N186 End stage renal disease: Secondary | ICD-10-CM | POA: Diagnosis not present

## 2017-04-22 DIAGNOSIS — E1129 Type 2 diabetes mellitus with other diabetic kidney complication: Secondary | ICD-10-CM | POA: Diagnosis not present

## 2017-04-24 DIAGNOSIS — N2581 Secondary hyperparathyroidism of renal origin: Secondary | ICD-10-CM | POA: Diagnosis not present

## 2017-04-24 DIAGNOSIS — E1129 Type 2 diabetes mellitus with other diabetic kidney complication: Secondary | ICD-10-CM | POA: Diagnosis not present

## 2017-04-24 DIAGNOSIS — N186 End stage renal disease: Secondary | ICD-10-CM | POA: Diagnosis not present

## 2017-04-24 DIAGNOSIS — D631 Anemia in chronic kidney disease: Secondary | ICD-10-CM | POA: Diagnosis not present

## 2017-04-26 DIAGNOSIS — T8620 Unspecified complication of heart transplant: Secondary | ICD-10-CM | POA: Diagnosis not present

## 2017-04-26 DIAGNOSIS — D631 Anemia in chronic kidney disease: Secondary | ICD-10-CM | POA: Diagnosis not present

## 2017-04-26 DIAGNOSIS — E1129 Type 2 diabetes mellitus with other diabetic kidney complication: Secondary | ICD-10-CM | POA: Diagnosis not present

## 2017-04-26 DIAGNOSIS — N2581 Secondary hyperparathyroidism of renal origin: Secondary | ICD-10-CM | POA: Diagnosis not present

## 2017-04-26 DIAGNOSIS — N186 End stage renal disease: Secondary | ICD-10-CM | POA: Diagnosis not present

## 2017-04-26 DIAGNOSIS — Z992 Dependence on renal dialysis: Secondary | ICD-10-CM | POA: Diagnosis not present

## 2017-05-01 DIAGNOSIS — D631 Anemia in chronic kidney disease: Secondary | ICD-10-CM | POA: Diagnosis not present

## 2017-05-01 DIAGNOSIS — N2581 Secondary hyperparathyroidism of renal origin: Secondary | ICD-10-CM | POA: Diagnosis not present

## 2017-05-01 DIAGNOSIS — Z23 Encounter for immunization: Secondary | ICD-10-CM | POA: Diagnosis not present

## 2017-05-01 DIAGNOSIS — E1129 Type 2 diabetes mellitus with other diabetic kidney complication: Secondary | ICD-10-CM | POA: Diagnosis not present

## 2017-05-01 DIAGNOSIS — N186 End stage renal disease: Secondary | ICD-10-CM | POA: Diagnosis not present

## 2017-05-03 DIAGNOSIS — N2581 Secondary hyperparathyroidism of renal origin: Secondary | ICD-10-CM | POA: Diagnosis not present

## 2017-05-03 DIAGNOSIS — N186 End stage renal disease: Secondary | ICD-10-CM | POA: Diagnosis not present

## 2017-05-03 DIAGNOSIS — Z23 Encounter for immunization: Secondary | ICD-10-CM | POA: Diagnosis not present

## 2017-05-03 DIAGNOSIS — D631 Anemia in chronic kidney disease: Secondary | ICD-10-CM | POA: Diagnosis not present

## 2017-05-03 DIAGNOSIS — E1129 Type 2 diabetes mellitus with other diabetic kidney complication: Secondary | ICD-10-CM | POA: Diagnosis not present

## 2017-05-06 DIAGNOSIS — E1129 Type 2 diabetes mellitus with other diabetic kidney complication: Secondary | ICD-10-CM | POA: Diagnosis not present

## 2017-05-06 DIAGNOSIS — Z23 Encounter for immunization: Secondary | ICD-10-CM | POA: Diagnosis not present

## 2017-05-06 DIAGNOSIS — D631 Anemia in chronic kidney disease: Secondary | ICD-10-CM | POA: Diagnosis not present

## 2017-05-06 DIAGNOSIS — N2581 Secondary hyperparathyroidism of renal origin: Secondary | ICD-10-CM | POA: Diagnosis not present

## 2017-05-06 DIAGNOSIS — N186 End stage renal disease: Secondary | ICD-10-CM | POA: Diagnosis not present

## 2017-05-07 ENCOUNTER — Ambulatory Visit: Payer: Medicare Other | Admitting: Neurology

## 2017-05-07 DIAGNOSIS — Z029 Encounter for administrative examinations, unspecified: Secondary | ICD-10-CM

## 2017-05-08 DIAGNOSIS — N186 End stage renal disease: Secondary | ICD-10-CM | POA: Diagnosis not present

## 2017-05-08 DIAGNOSIS — D631 Anemia in chronic kidney disease: Secondary | ICD-10-CM | POA: Diagnosis not present

## 2017-05-08 DIAGNOSIS — Z23 Encounter for immunization: Secondary | ICD-10-CM | POA: Diagnosis not present

## 2017-05-08 DIAGNOSIS — N2581 Secondary hyperparathyroidism of renal origin: Secondary | ICD-10-CM | POA: Diagnosis not present

## 2017-05-08 DIAGNOSIS — E1129 Type 2 diabetes mellitus with other diabetic kidney complication: Secondary | ICD-10-CM | POA: Diagnosis not present

## 2017-05-10 DIAGNOSIS — M1611 Unilateral primary osteoarthritis, right hip: Secondary | ICD-10-CM | POA: Diagnosis not present

## 2017-05-10 DIAGNOSIS — E1129 Type 2 diabetes mellitus with other diabetic kidney complication: Secondary | ICD-10-CM | POA: Diagnosis not present

## 2017-05-10 DIAGNOSIS — D631 Anemia in chronic kidney disease: Secondary | ICD-10-CM | POA: Diagnosis not present

## 2017-05-10 DIAGNOSIS — Z23 Encounter for immunization: Secondary | ICD-10-CM | POA: Diagnosis not present

## 2017-05-10 DIAGNOSIS — N2581 Secondary hyperparathyroidism of renal origin: Secondary | ICD-10-CM | POA: Diagnosis not present

## 2017-05-10 DIAGNOSIS — N186 End stage renal disease: Secondary | ICD-10-CM | POA: Diagnosis not present

## 2017-05-13 DIAGNOSIS — E1129 Type 2 diabetes mellitus with other diabetic kidney complication: Secondary | ICD-10-CM | POA: Diagnosis not present

## 2017-05-13 DIAGNOSIS — Z23 Encounter for immunization: Secondary | ICD-10-CM | POA: Diagnosis not present

## 2017-05-13 DIAGNOSIS — D631 Anemia in chronic kidney disease: Secondary | ICD-10-CM | POA: Diagnosis not present

## 2017-05-13 DIAGNOSIS — N186 End stage renal disease: Secondary | ICD-10-CM | POA: Diagnosis not present

## 2017-05-13 DIAGNOSIS — N2581 Secondary hyperparathyroidism of renal origin: Secondary | ICD-10-CM | POA: Diagnosis not present

## 2017-05-14 ENCOUNTER — Encounter: Payer: Self-pay | Admitting: Neurology

## 2017-05-15 DIAGNOSIS — N2581 Secondary hyperparathyroidism of renal origin: Secondary | ICD-10-CM | POA: Diagnosis not present

## 2017-05-15 DIAGNOSIS — E1129 Type 2 diabetes mellitus with other diabetic kidney complication: Secondary | ICD-10-CM | POA: Diagnosis not present

## 2017-05-15 DIAGNOSIS — Z23 Encounter for immunization: Secondary | ICD-10-CM | POA: Diagnosis not present

## 2017-05-15 DIAGNOSIS — N186 End stage renal disease: Secondary | ICD-10-CM | POA: Diagnosis not present

## 2017-05-15 DIAGNOSIS — D631 Anemia in chronic kidney disease: Secondary | ICD-10-CM | POA: Diagnosis not present

## 2017-05-17 DIAGNOSIS — N186 End stage renal disease: Secondary | ICD-10-CM | POA: Diagnosis not present

## 2017-05-17 DIAGNOSIS — N2581 Secondary hyperparathyroidism of renal origin: Secondary | ICD-10-CM | POA: Diagnosis not present

## 2017-05-17 DIAGNOSIS — E1129 Type 2 diabetes mellitus with other diabetic kidney complication: Secondary | ICD-10-CM | POA: Diagnosis not present

## 2017-05-17 DIAGNOSIS — D631 Anemia in chronic kidney disease: Secondary | ICD-10-CM | POA: Diagnosis not present

## 2017-05-17 DIAGNOSIS — Z23 Encounter for immunization: Secondary | ICD-10-CM | POA: Diagnosis not present

## 2017-05-20 DIAGNOSIS — Z23 Encounter for immunization: Secondary | ICD-10-CM | POA: Diagnosis not present

## 2017-05-20 DIAGNOSIS — D631 Anemia in chronic kidney disease: Secondary | ICD-10-CM | POA: Diagnosis not present

## 2017-05-20 DIAGNOSIS — N186 End stage renal disease: Secondary | ICD-10-CM | POA: Diagnosis not present

## 2017-05-20 DIAGNOSIS — E1129 Type 2 diabetes mellitus with other diabetic kidney complication: Secondary | ICD-10-CM | POA: Diagnosis not present

## 2017-05-20 DIAGNOSIS — N2581 Secondary hyperparathyroidism of renal origin: Secondary | ICD-10-CM | POA: Diagnosis not present

## 2017-05-21 DIAGNOSIS — N529 Male erectile dysfunction, unspecified: Secondary | ICD-10-CM | POA: Diagnosis not present

## 2017-05-21 DIAGNOSIS — Z4821 Encounter for aftercare following heart transplant: Secondary | ICD-10-CM | POA: Diagnosis not present

## 2017-05-21 DIAGNOSIS — I340 Nonrheumatic mitral (valve) insufficiency: Secondary | ICD-10-CM | POA: Diagnosis not present

## 2017-05-21 DIAGNOSIS — Z79899 Other long term (current) drug therapy: Secondary | ICD-10-CM | POA: Diagnosis not present

## 2017-05-21 DIAGNOSIS — Z01810 Encounter for preprocedural cardiovascular examination: Secondary | ICD-10-CM | POA: Diagnosis not present

## 2017-05-21 DIAGNOSIS — R972 Elevated prostate specific antigen [PSA]: Secondary | ICD-10-CM | POA: Diagnosis not present

## 2017-05-21 DIAGNOSIS — I2510 Atherosclerotic heart disease of native coronary artery without angina pectoris: Secondary | ICD-10-CM | POA: Diagnosis not present

## 2017-05-21 DIAGNOSIS — N400 Enlarged prostate without lower urinary tract symptoms: Secondary | ICD-10-CM | POA: Diagnosis not present

## 2017-05-21 DIAGNOSIS — Z941 Heart transplant status: Secondary | ICD-10-CM | POA: Diagnosis not present

## 2017-05-21 DIAGNOSIS — I120 Hypertensive chronic kidney disease with stage 5 chronic kidney disease or end stage renal disease: Secondary | ICD-10-CM | POA: Diagnosis not present

## 2017-05-21 DIAGNOSIS — M25559 Pain in unspecified hip: Secondary | ICD-10-CM | POA: Diagnosis not present

## 2017-05-21 DIAGNOSIS — I10 Essential (primary) hypertension: Secondary | ICD-10-CM | POA: Diagnosis not present

## 2017-05-21 DIAGNOSIS — E669 Obesity, unspecified: Secondary | ICD-10-CM | POA: Diagnosis not present

## 2017-05-21 DIAGNOSIS — Z48298 Encounter for aftercare following other organ transplant: Secondary | ICD-10-CM | POA: Diagnosis not present

## 2017-05-21 DIAGNOSIS — N186 End stage renal disease: Secondary | ICD-10-CM | POA: Diagnosis not present

## 2017-05-21 DIAGNOSIS — E7800 Pure hypercholesterolemia, unspecified: Secondary | ICD-10-CM | POA: Diagnosis not present

## 2017-05-21 DIAGNOSIS — Z6837 Body mass index (BMI) 37.0-37.9, adult: Secondary | ICD-10-CM | POA: Diagnosis not present

## 2017-05-21 DIAGNOSIS — Z992 Dependence on renal dialysis: Secondary | ICD-10-CM | POA: Diagnosis not present

## 2017-05-21 DIAGNOSIS — Z8042 Family history of malignant neoplasm of prostate: Secondary | ICD-10-CM | POA: Diagnosis not present

## 2017-05-21 DIAGNOSIS — Z7682 Awaiting organ transplant status: Secondary | ICD-10-CM | POA: Diagnosis not present

## 2017-05-21 DIAGNOSIS — I517 Cardiomegaly: Secondary | ICD-10-CM | POA: Diagnosis not present

## 2017-05-22 DIAGNOSIS — N186 End stage renal disease: Secondary | ICD-10-CM | POA: Diagnosis not present

## 2017-05-22 DIAGNOSIS — M1611 Unilateral primary osteoarthritis, right hip: Secondary | ICD-10-CM | POA: Diagnosis not present

## 2017-05-22 DIAGNOSIS — Z23 Encounter for immunization: Secondary | ICD-10-CM | POA: Diagnosis not present

## 2017-05-22 DIAGNOSIS — E1129 Type 2 diabetes mellitus with other diabetic kidney complication: Secondary | ICD-10-CM | POA: Diagnosis not present

## 2017-05-22 DIAGNOSIS — N2581 Secondary hyperparathyroidism of renal origin: Secondary | ICD-10-CM | POA: Diagnosis not present

## 2017-05-22 DIAGNOSIS — D631 Anemia in chronic kidney disease: Secondary | ICD-10-CM | POA: Diagnosis not present

## 2017-05-24 DIAGNOSIS — N2581 Secondary hyperparathyroidism of renal origin: Secondary | ICD-10-CM | POA: Diagnosis not present

## 2017-05-24 DIAGNOSIS — E1129 Type 2 diabetes mellitus with other diabetic kidney complication: Secondary | ICD-10-CM | POA: Diagnosis not present

## 2017-05-24 DIAGNOSIS — Z23 Encounter for immunization: Secondary | ICD-10-CM | POA: Diagnosis not present

## 2017-05-24 DIAGNOSIS — N186 End stage renal disease: Secondary | ICD-10-CM | POA: Diagnosis not present

## 2017-05-24 DIAGNOSIS — D631 Anemia in chronic kidney disease: Secondary | ICD-10-CM | POA: Diagnosis not present

## 2017-05-26 DIAGNOSIS — T8620 Unspecified complication of heart transplant: Secondary | ICD-10-CM | POA: Diagnosis not present

## 2017-05-26 DIAGNOSIS — Z992 Dependence on renal dialysis: Secondary | ICD-10-CM | POA: Diagnosis not present

## 2017-05-26 DIAGNOSIS — N186 End stage renal disease: Secondary | ICD-10-CM | POA: Diagnosis not present

## 2017-05-27 DIAGNOSIS — D631 Anemia in chronic kidney disease: Secondary | ICD-10-CM | POA: Diagnosis not present

## 2017-05-27 DIAGNOSIS — D509 Iron deficiency anemia, unspecified: Secondary | ICD-10-CM | POA: Diagnosis not present

## 2017-05-27 DIAGNOSIS — Z992 Dependence on renal dialysis: Secondary | ICD-10-CM | POA: Diagnosis not present

## 2017-05-27 DIAGNOSIS — N2581 Secondary hyperparathyroidism of renal origin: Secondary | ICD-10-CM | POA: Diagnosis not present

## 2017-05-27 DIAGNOSIS — N186 End stage renal disease: Secondary | ICD-10-CM | POA: Diagnosis not present

## 2017-05-27 DIAGNOSIS — E1129 Type 2 diabetes mellitus with other diabetic kidney complication: Secondary | ICD-10-CM | POA: Diagnosis not present

## 2017-05-29 DIAGNOSIS — D631 Anemia in chronic kidney disease: Secondary | ICD-10-CM | POA: Diagnosis not present

## 2017-05-29 DIAGNOSIS — Z992 Dependence on renal dialysis: Secondary | ICD-10-CM | POA: Diagnosis not present

## 2017-05-29 DIAGNOSIS — N186 End stage renal disease: Secondary | ICD-10-CM | POA: Diagnosis not present

## 2017-05-29 DIAGNOSIS — N2581 Secondary hyperparathyroidism of renal origin: Secondary | ICD-10-CM | POA: Diagnosis not present

## 2017-05-29 DIAGNOSIS — E1129 Type 2 diabetes mellitus with other diabetic kidney complication: Secondary | ICD-10-CM | POA: Diagnosis not present

## 2017-05-29 DIAGNOSIS — D509 Iron deficiency anemia, unspecified: Secondary | ICD-10-CM | POA: Diagnosis not present

## 2017-05-31 ENCOUNTER — Other Ambulatory Visit: Payer: Self-pay | Admitting: Neurology

## 2017-05-31 DIAGNOSIS — D631 Anemia in chronic kidney disease: Secondary | ICD-10-CM | POA: Diagnosis not present

## 2017-05-31 DIAGNOSIS — N2581 Secondary hyperparathyroidism of renal origin: Secondary | ICD-10-CM | POA: Diagnosis not present

## 2017-05-31 DIAGNOSIS — Z992 Dependence on renal dialysis: Secondary | ICD-10-CM | POA: Diagnosis not present

## 2017-05-31 DIAGNOSIS — E1129 Type 2 diabetes mellitus with other diabetic kidney complication: Secondary | ICD-10-CM | POA: Diagnosis not present

## 2017-05-31 DIAGNOSIS — D509 Iron deficiency anemia, unspecified: Secondary | ICD-10-CM | POA: Diagnosis not present

## 2017-05-31 DIAGNOSIS — N186 End stage renal disease: Secondary | ICD-10-CM | POA: Diagnosis not present

## 2017-06-03 DIAGNOSIS — D509 Iron deficiency anemia, unspecified: Secondary | ICD-10-CM | POA: Diagnosis not present

## 2017-06-03 DIAGNOSIS — N186 End stage renal disease: Secondary | ICD-10-CM | POA: Diagnosis not present

## 2017-06-03 DIAGNOSIS — D631 Anemia in chronic kidney disease: Secondary | ICD-10-CM | POA: Diagnosis not present

## 2017-06-03 DIAGNOSIS — N2581 Secondary hyperparathyroidism of renal origin: Secondary | ICD-10-CM | POA: Diagnosis not present

## 2017-06-03 DIAGNOSIS — Z992 Dependence on renal dialysis: Secondary | ICD-10-CM | POA: Diagnosis not present

## 2017-06-03 DIAGNOSIS — E1129 Type 2 diabetes mellitus with other diabetic kidney complication: Secondary | ICD-10-CM | POA: Diagnosis not present

## 2017-06-05 DIAGNOSIS — E1129 Type 2 diabetes mellitus with other diabetic kidney complication: Secondary | ICD-10-CM | POA: Diagnosis not present

## 2017-06-05 DIAGNOSIS — N186 End stage renal disease: Secondary | ICD-10-CM | POA: Diagnosis not present

## 2017-06-05 DIAGNOSIS — Z992 Dependence on renal dialysis: Secondary | ICD-10-CM | POA: Diagnosis not present

## 2017-06-05 DIAGNOSIS — N2581 Secondary hyperparathyroidism of renal origin: Secondary | ICD-10-CM | POA: Diagnosis not present

## 2017-06-05 DIAGNOSIS — D631 Anemia in chronic kidney disease: Secondary | ICD-10-CM | POA: Diagnosis not present

## 2017-06-05 DIAGNOSIS — D509 Iron deficiency anemia, unspecified: Secondary | ICD-10-CM | POA: Diagnosis not present

## 2017-06-07 DIAGNOSIS — D631 Anemia in chronic kidney disease: Secondary | ICD-10-CM | POA: Diagnosis not present

## 2017-06-07 DIAGNOSIS — E1129 Type 2 diabetes mellitus with other diabetic kidney complication: Secondary | ICD-10-CM | POA: Diagnosis not present

## 2017-06-07 DIAGNOSIS — N186 End stage renal disease: Secondary | ICD-10-CM | POA: Diagnosis not present

## 2017-06-07 DIAGNOSIS — N2581 Secondary hyperparathyroidism of renal origin: Secondary | ICD-10-CM | POA: Diagnosis not present

## 2017-06-07 DIAGNOSIS — Z992 Dependence on renal dialysis: Secondary | ICD-10-CM | POA: Diagnosis not present

## 2017-06-07 DIAGNOSIS — D509 Iron deficiency anemia, unspecified: Secondary | ICD-10-CM | POA: Diagnosis not present

## 2017-06-10 DIAGNOSIS — Z992 Dependence on renal dialysis: Secondary | ICD-10-CM | POA: Diagnosis not present

## 2017-06-10 DIAGNOSIS — M1611 Unilateral primary osteoarthritis, right hip: Secondary | ICD-10-CM | POA: Diagnosis not present

## 2017-06-10 DIAGNOSIS — D509 Iron deficiency anemia, unspecified: Secondary | ICD-10-CM | POA: Diagnosis not present

## 2017-06-10 DIAGNOSIS — D631 Anemia in chronic kidney disease: Secondary | ICD-10-CM | POA: Diagnosis not present

## 2017-06-10 DIAGNOSIS — N186 End stage renal disease: Secondary | ICD-10-CM | POA: Diagnosis not present

## 2017-06-10 DIAGNOSIS — N2581 Secondary hyperparathyroidism of renal origin: Secondary | ICD-10-CM | POA: Diagnosis not present

## 2017-06-10 DIAGNOSIS — G629 Polyneuropathy, unspecified: Secondary | ICD-10-CM | POA: Diagnosis not present

## 2017-06-10 DIAGNOSIS — E1129 Type 2 diabetes mellitus with other diabetic kidney complication: Secondary | ICD-10-CM | POA: Diagnosis not present

## 2017-06-10 DIAGNOSIS — Z01818 Encounter for other preprocedural examination: Secondary | ICD-10-CM | POA: Diagnosis not present

## 2017-06-12 DIAGNOSIS — N2581 Secondary hyperparathyroidism of renal origin: Secondary | ICD-10-CM | POA: Diagnosis not present

## 2017-06-12 DIAGNOSIS — D631 Anemia in chronic kidney disease: Secondary | ICD-10-CM | POA: Diagnosis not present

## 2017-06-12 DIAGNOSIS — D509 Iron deficiency anemia, unspecified: Secondary | ICD-10-CM | POA: Diagnosis not present

## 2017-06-12 DIAGNOSIS — N186 End stage renal disease: Secondary | ICD-10-CM | POA: Diagnosis not present

## 2017-06-12 DIAGNOSIS — E1129 Type 2 diabetes mellitus with other diabetic kidney complication: Secondary | ICD-10-CM | POA: Diagnosis not present

## 2017-06-12 DIAGNOSIS — Z992 Dependence on renal dialysis: Secondary | ICD-10-CM | POA: Diagnosis not present

## 2017-06-13 DIAGNOSIS — E1122 Type 2 diabetes mellitus with diabetic chronic kidney disease: Secondary | ICD-10-CM | POA: Diagnosis not present

## 2017-06-13 DIAGNOSIS — I10 Essential (primary) hypertension: Secondary | ICD-10-CM | POA: Diagnosis not present

## 2017-06-13 DIAGNOSIS — M25551 Pain in right hip: Secondary | ICD-10-CM | POA: Diagnosis not present

## 2017-06-13 DIAGNOSIS — E782 Mixed hyperlipidemia: Secondary | ICD-10-CM | POA: Diagnosis not present

## 2017-06-14 DIAGNOSIS — N186 End stage renal disease: Secondary | ICD-10-CM | POA: Diagnosis not present

## 2017-06-14 DIAGNOSIS — E1129 Type 2 diabetes mellitus with other diabetic kidney complication: Secondary | ICD-10-CM | POA: Diagnosis not present

## 2017-06-14 DIAGNOSIS — Z992 Dependence on renal dialysis: Secondary | ICD-10-CM | POA: Diagnosis not present

## 2017-06-14 DIAGNOSIS — N2581 Secondary hyperparathyroidism of renal origin: Secondary | ICD-10-CM | POA: Diagnosis not present

## 2017-06-14 DIAGNOSIS — D631 Anemia in chronic kidney disease: Secondary | ICD-10-CM | POA: Diagnosis not present

## 2017-06-14 DIAGNOSIS — D509 Iron deficiency anemia, unspecified: Secondary | ICD-10-CM | POA: Diagnosis not present

## 2017-06-17 DIAGNOSIS — Z992 Dependence on renal dialysis: Secondary | ICD-10-CM | POA: Diagnosis not present

## 2017-06-17 DIAGNOSIS — N2581 Secondary hyperparathyroidism of renal origin: Secondary | ICD-10-CM | POA: Diagnosis not present

## 2017-06-17 DIAGNOSIS — N186 End stage renal disease: Secondary | ICD-10-CM | POA: Diagnosis not present

## 2017-06-17 DIAGNOSIS — D631 Anemia in chronic kidney disease: Secondary | ICD-10-CM | POA: Diagnosis not present

## 2017-06-17 DIAGNOSIS — D509 Iron deficiency anemia, unspecified: Secondary | ICD-10-CM | POA: Diagnosis not present

## 2017-06-17 DIAGNOSIS — E1129 Type 2 diabetes mellitus with other diabetic kidney complication: Secondary | ICD-10-CM | POA: Diagnosis not present

## 2017-06-19 ENCOUNTER — Ambulatory Visit: Payer: Self-pay | Admitting: Orthopedic Surgery

## 2017-06-19 DIAGNOSIS — Z992 Dependence on renal dialysis: Secondary | ICD-10-CM | POA: Diagnosis not present

## 2017-06-19 DIAGNOSIS — N2581 Secondary hyperparathyroidism of renal origin: Secondary | ICD-10-CM | POA: Diagnosis not present

## 2017-06-19 DIAGNOSIS — D509 Iron deficiency anemia, unspecified: Secondary | ICD-10-CM | POA: Diagnosis not present

## 2017-06-19 DIAGNOSIS — E1129 Type 2 diabetes mellitus with other diabetic kidney complication: Secondary | ICD-10-CM | POA: Diagnosis not present

## 2017-06-19 DIAGNOSIS — N186 End stage renal disease: Secondary | ICD-10-CM | POA: Diagnosis not present

## 2017-06-19 DIAGNOSIS — D631 Anemia in chronic kidney disease: Secondary | ICD-10-CM | POA: Diagnosis not present

## 2017-06-21 DIAGNOSIS — N2581 Secondary hyperparathyroidism of renal origin: Secondary | ICD-10-CM | POA: Diagnosis not present

## 2017-06-21 DIAGNOSIS — N186 End stage renal disease: Secondary | ICD-10-CM | POA: Diagnosis not present

## 2017-06-21 DIAGNOSIS — D509 Iron deficiency anemia, unspecified: Secondary | ICD-10-CM | POA: Diagnosis not present

## 2017-06-21 DIAGNOSIS — Z992 Dependence on renal dialysis: Secondary | ICD-10-CM | POA: Diagnosis not present

## 2017-06-21 DIAGNOSIS — E1129 Type 2 diabetes mellitus with other diabetic kidney complication: Secondary | ICD-10-CM | POA: Diagnosis not present

## 2017-06-21 DIAGNOSIS — D631 Anemia in chronic kidney disease: Secondary | ICD-10-CM | POA: Diagnosis not present

## 2017-06-24 DIAGNOSIS — D631 Anemia in chronic kidney disease: Secondary | ICD-10-CM | POA: Diagnosis not present

## 2017-06-24 DIAGNOSIS — Z992 Dependence on renal dialysis: Secondary | ICD-10-CM | POA: Diagnosis not present

## 2017-06-24 DIAGNOSIS — D509 Iron deficiency anemia, unspecified: Secondary | ICD-10-CM | POA: Diagnosis not present

## 2017-06-24 DIAGNOSIS — E1129 Type 2 diabetes mellitus with other diabetic kidney complication: Secondary | ICD-10-CM | POA: Diagnosis not present

## 2017-06-24 DIAGNOSIS — N186 End stage renal disease: Secondary | ICD-10-CM | POA: Diagnosis not present

## 2017-06-24 DIAGNOSIS — N2581 Secondary hyperparathyroidism of renal origin: Secondary | ICD-10-CM | POA: Diagnosis not present

## 2017-06-26 DIAGNOSIS — E1129 Type 2 diabetes mellitus with other diabetic kidney complication: Secondary | ICD-10-CM | POA: Diagnosis not present

## 2017-06-26 DIAGNOSIS — Z992 Dependence on renal dialysis: Secondary | ICD-10-CM | POA: Diagnosis not present

## 2017-06-26 DIAGNOSIS — D631 Anemia in chronic kidney disease: Secondary | ICD-10-CM | POA: Diagnosis not present

## 2017-06-26 DIAGNOSIS — D509 Iron deficiency anemia, unspecified: Secondary | ICD-10-CM | POA: Diagnosis not present

## 2017-06-26 DIAGNOSIS — N186 End stage renal disease: Secondary | ICD-10-CM | POA: Diagnosis not present

## 2017-06-26 DIAGNOSIS — T8620 Unspecified complication of heart transplant: Secondary | ICD-10-CM | POA: Diagnosis not present

## 2017-06-26 DIAGNOSIS — N2581 Secondary hyperparathyroidism of renal origin: Secondary | ICD-10-CM | POA: Diagnosis not present

## 2017-06-28 DIAGNOSIS — N186 End stage renal disease: Secondary | ICD-10-CM | POA: Diagnosis not present

## 2017-06-28 DIAGNOSIS — E8770 Fluid overload, unspecified: Secondary | ICD-10-CM | POA: Diagnosis not present

## 2017-06-28 DIAGNOSIS — D509 Iron deficiency anemia, unspecified: Secondary | ICD-10-CM | POA: Diagnosis not present

## 2017-06-28 DIAGNOSIS — E1129 Type 2 diabetes mellitus with other diabetic kidney complication: Secondary | ICD-10-CM | POA: Diagnosis not present

## 2017-06-28 DIAGNOSIS — N2581 Secondary hyperparathyroidism of renal origin: Secondary | ICD-10-CM | POA: Diagnosis not present

## 2017-06-28 DIAGNOSIS — D631 Anemia in chronic kidney disease: Secondary | ICD-10-CM | POA: Diagnosis not present

## 2017-06-29 ENCOUNTER — Other Ambulatory Visit: Payer: Self-pay | Admitting: Neurology

## 2017-07-01 ENCOUNTER — Other Ambulatory Visit: Payer: Self-pay | Admitting: *Deleted

## 2017-07-01 DIAGNOSIS — D631 Anemia in chronic kidney disease: Secondary | ICD-10-CM | POA: Diagnosis not present

## 2017-07-01 DIAGNOSIS — N2581 Secondary hyperparathyroidism of renal origin: Secondary | ICD-10-CM | POA: Diagnosis not present

## 2017-07-01 DIAGNOSIS — E1129 Type 2 diabetes mellitus with other diabetic kidney complication: Secondary | ICD-10-CM | POA: Diagnosis not present

## 2017-07-01 DIAGNOSIS — D509 Iron deficiency anemia, unspecified: Secondary | ICD-10-CM | POA: Diagnosis not present

## 2017-07-01 DIAGNOSIS — E8770 Fluid overload, unspecified: Secondary | ICD-10-CM | POA: Diagnosis not present

## 2017-07-01 DIAGNOSIS — N186 End stage renal disease: Secondary | ICD-10-CM | POA: Diagnosis not present

## 2017-07-01 MED ORDER — VENLAFAXINE HCL 37.5 MG PO TABS: ORAL_TABLET | ORAL | 5 refills | Status: DC

## 2017-07-02 ENCOUNTER — Ambulatory Visit: Payer: Self-pay | Admitting: Orthopedic Surgery

## 2017-07-02 NOTE — H&P (View-Only) (Signed)
TOTAL HIP ADMISSION H&P  Patient is admitted for right total hip arthroplasty.  Subjective:  Chief Complaint: right hip pain  HPI: [name], 55 y.o. male, has a history of pain and functional disability in the right hip(s) due to arthritis and patient has failed non-surgical conservative treatments for greater than 12 weeks to include NSAID's and/or analgesics, flexibility and strengthening excercises, use of assistive devices, weight reduction as appropriate and activity modification.  Onset of symptoms was gradual starting 3 years ago with gradually worsening course since that time.The patient noted no past surgery on the right hip(s).  Patient currently rates pain in the right hip at 10 out of 10 with activity. Patient has night pain, worsening of pain with activity and weight bearing, trendelenberg gait, pain that interfers with activities of daily living, pain with passive range of motion and crepitus. Patient has evidence of subchondral cysts, subchondral sclerosis, periarticular osteophytes and joint space narrowing by imaging studies. This condition presents safety issues increasing the risk of falls.  There is no current active infection.  Patient Active Problem List   Diagnosis Date Noted  . VENTRICULAR TACHYCARDIA 12/21/2009  . HYPERLIPIDEMIA 08/30/2009  . HYPERTENSION, UNSPECIFIED 08/30/2009  . ISCHEMIC CARDIOMYOPATHY 08/30/2009  . ATRIAL FIBRILLATION 08/30/2009  . VENTRICULAR ECTOPY 08/30/2009  . CEREBROVASCULAR ACCIDENT, HX OF 08/30/2009  . MORBID OBESITY, HX OF 08/30/2009  . DIABETES MELLITUS, TYPE II 10/17/2007  . OBSTRUCTIVE SLEEP APNEA 10/17/2007  . CARDIOMYOPATHY, DILATED 10/17/2007   Past Medical History:  Diagnosis Date  . Atrial fibrillation ([place])   . CHF (congestive heart failure), NYHA class III ([place])    s/p BiV ICD and LVAD  . Chronic systolic dysfunction of left ventricle   . CVA (cerebral infarction)   . Diabetes mellitus   . Family history of coronary  artery disease    in both parents  . Hyperlipidemia   . Hypertension   . Left bundle branch block   . Morbid obesity ([place])    status post lap band  . Nonischemic cardiomyopathy (HCC)    Est. EF of 15% to 20%, s/p BiV ICD and LVAD, on [facility] list  . Obstructive sleep apnea   . Premature ventricular contractions   . SOB (shortness of breath)   . Stroke [facility])     Past Surgical History:  Procedure Laterality Date  . CARDIAC PACEMAKER PLACEMENT  09/21/2009   Biventricular implantable cardioverter-defibrillator implantation     . FOOT SURGERY     left  . HEART TRANSPLANT  2014  . LAPAROSCOPIC GASTRIC BANDING  01/27/2007  . LEFT VENTRICULAR ASSIST DEVICE     implant[facility] at [facility]    Current Outpatient Medications  Medication Sig Dispense Refill Last Dose  . aspirin 81 MG tablet Take 81 mg by mouth daily.   Taking  . B Complex-C-Folic Acid (RENO CAPS) 1 MG CAPS TAKE 1 CAPSULE BY MOUTH ONCE DAILY.   Taking  . cephALEXin (KEFLEX) 500 MG capsule Take 1 capsule (500 mg total) by mouth 3 (three) times daily. 21 capsule 1 Taking  . cinacalcet (SENSIPAR) 30 MG tablet Take by mouth.   Taking  . citalopram (CELEXA) 20 MG tablet TAKE 1 TABLET(20 MG) BY MOUTH EVERY DAY   Taking  . cycloSPORINE modified (NEORAL) 100 MG capsule TK ONE C PO  BID   Taking  . lidocaine (XYLOCAINE) 5 % ointment Apply 1 application topically as needed. Apply to feet twice daily as needed. 50 g 5   .  LYRICA 100 MG capsule TK 1 C PO BID  1 Taking  . mycophenolate (MYFORTIC) 360 MG TBEC EC tablet TAKE 2 TABLETS BY MOUTH TWICE DAILY   Taking  . omeprazole (PRILOSEC) 20 MG capsule TAKE 1 CAPSULE BY MOUTH DAILY   Taking  . ondansetron (ZOFRAN) 4 MG tablet Take 1 tablet by mouth every 12 hours as needed for nausea   Taking  . pravastatin (PRAVACHOL) 20 MG tablet Take 20 mg by mouth daily.   Taking  . sevelamer carbonate (RENVELA) 800 MG tablet Take by mouth.   Taking  . temazepam (RESTORIL) 15 MG capsule Take by mouth.    Taking  . venlafaxine (EFFEXOR) 37.5 MG tablet Take half tablet x 1 month, then increase to 1 tablet daily.  On days of dialysis, take afterwards. 30 tablet 5    No current facility-administered medications for this visit.    Allergies  Allergen Reactions  . Heparin Other (See Comments)    Unknown reaction this year  . Penicillin G Potassium  [Penicillin G] Other (See Comments)    Social History   Tobacco Use  . Smoking status: Former Smoker    Types: Cigars  . Smokeless tobacco: Never Used  Substance Use Topics  . Alcohol use: No    Family History  Problem Relation Age of Onset  . Coronary artery disease Father        had, PTCA & CABG  . Heart attack Father   . Hypertension Father   . Diabetes Father   . Coronary artery disease Mother   . Heart attack Mother   . Hypertension Mother   . Stroke Mother   . Lupus Mother      Review of Systems  Constitutional: Positive for weight loss.  HENT: Negative.   Eyes: Negative.   Respiratory: Negative.   Cardiovascular: Negative.   Gastrointestinal: Negative.   Genitourinary: Negative.   Musculoskeletal: Positive for joint pain.  Skin: Negative.   Neurological: Negative.   Endo/Heme/Allergies: Negative.   Psychiatric/Behavioral: Negative.     Objective:  Physical Exam  Vitals reviewed. Constitutional: He is oriented to person, place, and time. He appears well-developed and well-nourished.  HENT:  Head: Normocephalic and atraumatic.  Eyes: Conjunctivae and EOM are normal. Pupils are equal, round, and reactive to light.  Neck: Normal range of motion. Neck supple.  Cardiovascular: Normal rate, regular rhythm and intact distal pulses.  Respiratory: Effort normal. No respiratory distress.  GI: Soft. He exhibits no distension.  Genitourinary:  Genitourinary Comments: deferred  Musculoskeletal:       Right hip: He exhibits decreased range of motion, decreased strength, bony tenderness and crepitus.  Neurological: He is  alert and oriented to person, place, and time. He has normal reflexes.  Skin: Skin is warm and dry.  Psychiatric: He has a normal mood and affect. His behavior is normal. Judgment and thought content normal.    Vital signs in last 24 hours: @VSRANGES @  Labs:   Estimated body mass index is 39.41 kg/m as calculated from the following:   Height as of 01/03/17: 6\' 6"  (1.981 m).   Weight as of 01/03/17: 154.7 kg (341 lb 1 oz).   Imaging Review Plain radiographs demonstrate severe degenerative joint disease of the right hip(s). The bone quality appears to be adequate for age and reported activity level.  Assessment/Plan:  End stage arthritis, right hip(s)  The patient history, physical examination, clinical judgement of the provider and imaging studies are consistent with end stage  degenerative joint disease of the right hip(s) and total hip arthroplasty is deemed medically necessary. The treatment options including medical management, injection therapy, arthroscopy and arthroplasty were discussed at length. The risks and benefits of total hip arthroplasty were presented and reviewed. The risks due to aseptic loosening, infection, stiffness, dislocation/subluxation,  thromboembolic complications and other imponderables were discussed.  The patient acknowledged the explanation, agreed to proceed with the plan and consent was signed. Patient is being admitted for inpatient treatment for surgery, pain control, PT, OT, prophylactic antibiotics, VTE prophylaxis, progressive ambulation and ADL's and discharge planning.The patient is planning to be discharged home with home health services. Needs postop dialysis.

## 2017-07-02 NOTE — H&P (Signed)
TOTAL HIP ADMISSION H&P  Patient is admitted for right total hip arthroplasty.  Subjective:  Chief Complaint: right hip pain  HPI: [name], 56 y.o. male, has a history of pain and functional disability in the right hip(s) due to arthritis and patient has failed non-surgical conservative treatments for greater than 12 weeks to include NSAID's and/or analgesics, flexibility and strengthening excercises, use of assistive devices, weight reduction as appropriate and activity modification.  Onset of symptoms was gradual starting 3 years ago with gradually worsening course since that time.The patient noted no past surgery on the right hip(s).  Patient currently rates pain in the right hip at 10 out of 10 with activity. Patient has night pain, worsening of pain with activity and weight bearing, trendelenberg gait, pain that interfers with activities of daily living, pain with passive range of motion and crepitus. Patient has evidence of subchondral cysts, subchondral sclerosis, periarticular osteophytes and joint space narrowing by imaging studies. This condition presents safety issues increasing the risk of falls.  There is no current active infection.  Patient Active Problem List   Diagnosis Date Noted  . VENTRICULAR TACHYCARDIA 12/21/2009  . HYPERLIPIDEMIA 08/30/2009  . HYPERTENSION, UNSPECIFIED 08/30/2009  . ISCHEMIC CARDIOMYOPATHY 08/30/2009  . ATRIAL FIBRILLATION 08/30/2009  . VENTRICULAR ECTOPY 08/30/2009  . CEREBROVASCULAR ACCIDENT, HX OF 08/30/2009  . MORBID OBESITY, HX OF 08/30/2009  . DIABETES MELLITUS, TYPE II 10/17/2007  . OBSTRUCTIVE SLEEP APNEA 10/17/2007  . CARDIOMYOPATHY, DILATED 10/17/2007   Past Medical History:  Diagnosis Date  . Atrial fibrillation ([place])   . CHF (congestive heart failure), NYHA class III ([place])    s/p BiV ICD and LVAD  . Chronic systolic dysfunction of left ventricle   . CVA (cerebral infarction)   . Diabetes mellitus   . Family history of coronary  artery disease    in both parents  . Hyperlipidemia   . Hypertension   . Left bundle branch block   . Morbid obesity ([place])    status post lap band  . Nonischemic cardiomyopathy (HCC)    Est. EF of 15% to 20%, s/p BiV ICD and LVAD, on [facility] list  . Obstructive sleep apnea   . Premature ventricular contractions   . SOB (shortness of breath)   . Stroke [facility])     Past Surgical History:  Procedure Laterality Date  . CARDIAC PACEMAKER PLACEMENT  09/21/2009   Biventricular implantable cardioverter-defibrillator implantation     . FOOT SURGERY     left  . HEART TRANSPLANT  2014  . LAPAROSCOPIC GASTRIC BANDING  01/27/2007  . LEFT VENTRICULAR ASSIST DEVICE     implanted at [facility]    Current Outpatient Medications  Medication Sig Dispense Refill Last Dose  . aspirin 81 MG tablet Take 81 mg by mouth daily.   Taking  . B Complex-C-Folic Acid (RENO CAPS) 1 MG CAPS TAKE 1 CAPSULE BY MOUTH ONCE DAILY.   Taking  . cephALEXin (KEFLEX) 500 MG capsule Take 1 capsule (500 mg total) by mouth 3 (three) times daily. 21 capsule 1 Taking  . cinacalcet (SENSIPAR) 30 MG tablet Take by mouth.   Taking  . citalopram (CELEXA) 20 MG tablet TAKE 1 TABLET(20 MG) BY MOUTH EVERY DAY   Taking  . cycloSPORINE modified (NEORAL) 100 MG capsule TK ONE C PO  BID   Taking  . lidocaine (XYLOCAINE) 5 % ointment Apply 1 application topically as needed. Apply to feet twice daily as needed. 50 g 5   .  LYRICA 100 MG capsule TK 1 C PO BID  1 Taking  . mycophenolate (MYFORTIC) 360 MG TBEC EC tablet TAKE 2 TABLETS BY MOUTH TWICE DAILY   Taking  . omeprazole (PRILOSEC) 20 MG capsule TAKE 1 CAPSULE BY MOUTH DAILY   Taking  . ondansetron (ZOFRAN) 4 MG tablet Take 1 tablet by mouth every 12 hours as needed for nausea   Taking  . pravastatin (PRAVACHOL) 20 MG tablet Take 20 mg by mouth daily.   Taking  . sevelamer carbonate (RENVELA) 800 MG tablet Take by mouth.   Taking  . temazepam (RESTORIL) 15 MG capsule Take by mouth.    Taking  . venlafaxine (EFFEXOR) 37.5 MG tablet Take half tablet x 1 month, then increase to 1 tablet daily.  On days of dialysis, take afterwards. 30 tablet 5    No current facility-administered medications for this visit.    Allergies  Allergen Reactions  . Heparin Other (See Comments)    Unknown reaction this year  . Penicillin G Potassium  [Penicillin G] Other (See Comments)    Social History   Tobacco Use  . Smoking status: Former Smoker    Types: Cigars  . Smokeless tobacco: Never Used  Substance Use Topics  . Alcohol use: No    Family History  Problem Relation Age of Onset  . Coronary artery disease Father        had, PTCA & CABG  . Heart attack Father   . Hypertension Father   . Diabetes Father   . Coronary artery disease Mother   . Heart attack Mother   . Hypertension Mother   . Stroke Mother   . Lupus Mother      Review of Systems  Constitutional: Positive for weight loss.  HENT: Negative.   Eyes: Negative.   Respiratory: Negative.   Cardiovascular: Negative.   Gastrointestinal: Negative.   Genitourinary: Negative.   Musculoskeletal: Positive for joint pain.  Skin: Negative.   Neurological: Negative.   Endo/Heme/Allergies: Negative.   Psychiatric/Behavioral: Negative.     Objective:  Physical Exam  Vitals reviewed. Constitutional: He is oriented to person, place, and time. He appears well-developed and well-nourished.  HENT:  Head: Normocephalic and atraumatic.  Eyes: Conjunctivae and EOM are normal. Pupils are equal, round, and reactive to light.  Neck: Normal range of motion. Neck supple.  Cardiovascular: Normal rate, regular rhythm and intact distal pulses.  Respiratory: Effort normal. No respiratory distress.  GI: Soft. He exhibits no distension.  Genitourinary:  Genitourinary Comments: deferred  Musculoskeletal:       Right hip: He exhibits decreased range of motion, decreased strength, bony tenderness and crepitus.  Neurological: He is  alert and oriented to person, place, and time. He has normal reflexes.  Skin: Skin is warm and dry.  Psychiatric: He has a normal mood and affect. His behavior is normal. Judgment and thought content normal.    Vital signs in last 24 hours: @VSRANGES @  Labs:   Estimated body mass index is 39.41 kg/m as calculated from the following:   Height as of 01/03/17: 6\' 6"  (1.981 m).   Weight as of 01/03/17: 154.7 kg (341 lb 1 oz).   Imaging Review Plain radiographs demonstrate severe degenerative joint disease of the right hip(s). The bone quality appears to be adequate for age and reported activity level.  Assessment/Plan:  End stage arthritis, right hip(s)  The patient history, physical examination, clinical judgement of the provider and imaging studies are consistent with end stage  degenerative joint disease of the right hip(s) and total hip arthroplasty is deemed medically necessary. The treatment options including medical management, injection therapy, arthroscopy and arthroplasty were discussed at length. The risks and benefits of total hip arthroplasty were presented and reviewed. The risks due to aseptic loosening, infection, stiffness, dislocation/subluxation,  thromboembolic complications and other imponderables were discussed.  The patient acknowledged the explanation, agreed to proceed with the plan and consent was signed. Patient is being admitted for inpatient treatment for surgery, pain control, PT, OT, prophylactic antibiotics, VTE prophylaxis, progressive ambulation and ADL's and discharge planning.The patient is planning to be discharged home with home health services. Needs postop dialysis.

## 2017-07-03 DIAGNOSIS — D631 Anemia in chronic kidney disease: Secondary | ICD-10-CM | POA: Diagnosis not present

## 2017-07-03 DIAGNOSIS — E1129 Type 2 diabetes mellitus with other diabetic kidney complication: Secondary | ICD-10-CM | POA: Diagnosis not present

## 2017-07-03 DIAGNOSIS — E8770 Fluid overload, unspecified: Secondary | ICD-10-CM | POA: Diagnosis not present

## 2017-07-03 DIAGNOSIS — N186 End stage renal disease: Secondary | ICD-10-CM | POA: Diagnosis not present

## 2017-07-03 DIAGNOSIS — D509 Iron deficiency anemia, unspecified: Secondary | ICD-10-CM | POA: Diagnosis not present

## 2017-07-03 DIAGNOSIS — N2581 Secondary hyperparathyroidism of renal origin: Secondary | ICD-10-CM | POA: Diagnosis not present

## 2017-07-05 DIAGNOSIS — N186 End stage renal disease: Secondary | ICD-10-CM | POA: Diagnosis not present

## 2017-07-05 DIAGNOSIS — N2581 Secondary hyperparathyroidism of renal origin: Secondary | ICD-10-CM | POA: Diagnosis not present

## 2017-07-05 DIAGNOSIS — E8770 Fluid overload, unspecified: Secondary | ICD-10-CM | POA: Diagnosis not present

## 2017-07-05 DIAGNOSIS — D631 Anemia in chronic kidney disease: Secondary | ICD-10-CM | POA: Diagnosis not present

## 2017-07-05 DIAGNOSIS — D509 Iron deficiency anemia, unspecified: Secondary | ICD-10-CM | POA: Diagnosis not present

## 2017-07-05 DIAGNOSIS — E1129 Type 2 diabetes mellitus with other diabetic kidney complication: Secondary | ICD-10-CM | POA: Diagnosis not present

## 2017-07-08 DIAGNOSIS — N186 End stage renal disease: Secondary | ICD-10-CM | POA: Diagnosis not present

## 2017-07-08 DIAGNOSIS — E8770 Fluid overload, unspecified: Secondary | ICD-10-CM | POA: Diagnosis not present

## 2017-07-08 DIAGNOSIS — N2581 Secondary hyperparathyroidism of renal origin: Secondary | ICD-10-CM | POA: Diagnosis not present

## 2017-07-08 DIAGNOSIS — D631 Anemia in chronic kidney disease: Secondary | ICD-10-CM | POA: Diagnosis not present

## 2017-07-08 DIAGNOSIS — D509 Iron deficiency anemia, unspecified: Secondary | ICD-10-CM | POA: Diagnosis not present

## 2017-07-08 DIAGNOSIS — E1129 Type 2 diabetes mellitus with other diabetic kidney complication: Secondary | ICD-10-CM | POA: Diagnosis not present

## 2017-07-10 DIAGNOSIS — E1129 Type 2 diabetes mellitus with other diabetic kidney complication: Secondary | ICD-10-CM | POA: Diagnosis not present

## 2017-07-10 DIAGNOSIS — E8770 Fluid overload, unspecified: Secondary | ICD-10-CM | POA: Diagnosis not present

## 2017-07-10 DIAGNOSIS — D509 Iron deficiency anemia, unspecified: Secondary | ICD-10-CM | POA: Diagnosis not present

## 2017-07-10 DIAGNOSIS — D631 Anemia in chronic kidney disease: Secondary | ICD-10-CM | POA: Diagnosis not present

## 2017-07-10 DIAGNOSIS — N2581 Secondary hyperparathyroidism of renal origin: Secondary | ICD-10-CM | POA: Diagnosis not present

## 2017-07-10 DIAGNOSIS — N186 End stage renal disease: Secondary | ICD-10-CM | POA: Diagnosis not present

## 2017-07-10 NOTE — Pre-Procedure Instructions (Signed)
[name]  07/10/2017      [facility] - [address], [address] [place] [address] Phone: [phone] Fax: [phone]    Your procedure is scheduled on Monday, July 22, 2017  Report to [facility] Admitting Entrance "A" at 10:50A.M.   Call this number if you have problems the morning of surgery:  [phone]   Remember:  Do not eat food or drink liquids after midnight.  Take these medicines the morning of surgery with A SIP OF WATER: CephALEXin (KEFLEX), Citalopram (CELEXA), LYRICA, Omeprazole (PRILOSEC), Venlafaxine (EFFEXOR), CycloSPORINE modified (NEORAL), and Mycophenolate (MYFORTIC). If needed Ondansetron [facility]) for nausea.  Follow your doctor's instruction regarding Aspirin.  7 days before surgery (Nov. 19), stop taking all Aspirin products, Vitamins, Fish oils, and Herbal medications. Also stop all NSAIDS i.e. Advil, Ibuprofen, Motrin, Aleve, Anaprox, Naproxen, BC and Goody Powders.   Do not wear jewelry.  Do not wear lotions, powders, colognes, or deodorant.  Do not shave 48 hours prior to surgery.  Men may shave face and neck.  Do not bring valuables to the hospital.  [facility] is not responsible for any belongings or valuables.  Contacts, dentures or bridgework may not be worn into surgery.  Leave your suitcase in the car.  After surgery it may be brought to your room.  For patients admitted to the hospital, discharge time will be determined by your treatment team.  Patients discharged the day of surgery will not be allowed to drive home.   Special instructions:   [facility]- Preparing For Surgery  Before surgery, you can play an important role. Because skin is not sterile, your skin needs to be as free of germs as possible. You can reduce the number of germs on your skin by washing with CHG (chlorahexidine gluconate) Soap before surgery.  CHG  is an antiseptic cleaner which kills germs and bonds with the skin to continue killing germs even after washing.  Please do not use if you have an allergy to CHG or antibacterial soaps. If your skin becomes reddened/irritated stop using the CHG.  Do not shave (including legs and underarms) for at least 48 hours prior to first CHG shower. It is OK to shave your face.  Please follow these instructions carefully.   1. Shower the NIGHT BEFORE SURGERY and the MORNING OF SURGERY with CHG.   2. If you chose to wash your hair, wash your hair first as usual with your normal shampoo.  3. After you shampoo, rinse your hair and body thoroughly to remove the shampoo.  4. Use CHG as you would any other liquid soap. You can apply CHG directly to the skin and wash gently with a scrungie or a clean washcloth.   5. Apply the CHG Soap to your body ONLY FROM THE NECK DOWN.  Do not use on open wounds or open sores. Avoid contact with your eyes, ears, mouth and genitals (private parts). Wash Face and genitals (private parts)  with your normal soap.  6. Wash thoroughly, paying special attention to the area where your surgery will be performed.  7. Thoroughly rinse your body with warm water from the neck down.  8. DO NOT shower/wash with your normal soap after using and rinsing off the CHG Soap.  9. Pat yourself dry with a CLEAN TOWEL.  10. Wear CLEAN PAJAMAS to bed the  night before surgery, wear comfortable clothes the morning of surgery  11. Place CLEAN SHEETS on your bed the night of your first shower and DO NOT SLEEP WITH PETS.  Day of Surgery: Do not apply any deodorants/lotions. Please wear clean clothes to the hospital/surgery center.    Please read over the following fact sheets that you were given. Pain Booklet, Coughing and Deep Breathing, Blood Transfusion Information, MRSA Information and Surgical Site Infection Prevention

## 2017-07-11 ENCOUNTER — Encounter (HOSPITAL_COMMUNITY): Payer: Self-pay

## 2017-07-11 ENCOUNTER — Encounter (HOSPITAL_COMMUNITY)
Admission: RE | Admit: 2017-07-11 | Discharge: 2017-07-11 | Disposition: A | Discharge status: Home / Self Care | Payer: Medicare Other | Source: Ambulatory Visit | Attending: Orthopedic Surgery | Admitting: Orthopedic Surgery

## 2017-07-11 ENCOUNTER — Other Ambulatory Visit: Payer: Self-pay

## 2017-07-11 DIAGNOSIS — M1611 Unilateral primary osteoarthritis, right hip: Secondary | ICD-10-CM | POA: Insufficient documentation

## 2017-07-11 DIAGNOSIS — Z01818 Encounter for other preprocedural examination: Secondary | ICD-10-CM | POA: Diagnosis not present

## 2017-07-11 HISTORY — DX: Obesity, unspecified: E66.9

## 2017-07-11 HISTORY — DX: Unspecified osteoarthritis, unspecified site: M19.90

## 2017-07-11 HISTORY — DX: Acute myocardial infarction, unspecified: I21.9

## 2017-07-11 HISTORY — DX: Chronic kidney disease, unspecified: N18.9

## 2017-07-11 LAB — CBC
HEMATOCRIT: 34.1 % — AB (ref 39.0–52.0)
HEMOGLOBIN: 10.9 g/dL — AB (ref 13.0–17.0)
MCH: 29.4 pg (ref 26.0–34.0)
MCHC: 32 g/dL (ref 30.0–36.0)
MCV: 91.9 fL (ref 78.0–100.0)
Platelets: 199 10*3/uL (ref 150–400)
RBC: 3.71 MIL/uL — AB (ref 4.22–5.81)
RDW: 15.5 % (ref 11.5–15.5)
WBC: 9.2 10*3/uL (ref 4.0–10.5)

## 2017-07-11 LAB — SURGICAL PCR SCREEN
MRSA, PCR: NEGATIVE
STAPHYLOCOCCUS AUREUS: NEGATIVE

## 2017-07-11 LAB — ABO/RH: ABO/RH(D): A POS

## 2017-07-11 LAB — BASIC METABOLIC PANEL
Anion gap: 14 (ref 5–15)
BUN: 33 mg/dL — AB (ref 6–20)
CALCIUM: 9.5 mg/dL (ref 8.9–10.3)
CO2: 27 mmol/L (ref 22–32)
CREATININE: 8.65 mg/dL — AB (ref 0.61–1.24)
Chloride: 96 mmol/L — ABNORMAL LOW (ref 101–111)
GFR calc Af Amer: 7 mL/min — ABNORMAL LOW (ref >60)
GFR, EST NON AFRICAN AMERICAN: 6 mL/min — AB (ref >60)
GLUCOSE: 79 mg/dL (ref 65–99)
Potassium: 4.4 mmol/L (ref 3.5–5.1)
Sodium: 137 mmol/L (ref 135–145)

## 2017-07-11 NOTE — Pre-Procedure Instructions (Signed)
    [name]  07/11/2017      [facility] - [address], [place] [address] Phone: [phone] Fax: [phone]    Your procedure is scheduled on Monday, July 22, 2017  Report to [facility] Admitting at 5:30 A.M.  Call this number if you have problems the morning of surgery:  [phone]   Remember:  Do not eat food or drink liquids after midnight Sunday, Nov. 25  Take these medicines the morning of surgery with A SIP OF WATER : citalopram (CELEXA), LYRICA,  mycophenolate (MYFORTIC), omeprazole (PRILOSEC), venlafaxine (EFFEXOR), cycloSPORINE modified (NEORAL), if needed: ondansetron [facility]) for nausea Follow your doctor's instructions regarding Aspirin Stop taking vitamins, fish oil and herbal medications. Do not take any NSAIDs ie: Ibuprofen, Advil, Naproxen (Aleve), Motrin, BC and Goody Powder or any medication containing Aspirin; stop Monday, July 15, 2017   Do not wear jewelry, make-up or nail polish.  Do not wear lotions, powders, or perfumes, or deoderant.  Do not shave 48 hours prior to surgery.  Men may shave face and neck.  Do not bring valuables to the hospital.  [facility] is not responsible for any belongings or valuables. Contacts, dentures or bridgework may not be worn into surgery.  Leave your suitcase in the car.  After surgery it may be brought to your room. For patients admitted to the hospital, discharge time will be determined by your treatment team. Special instructions: Shower the night before surgery and the morning of surgery with CHG. Please read over the following fact sheets that you were given. Pain Booklet, Coughing and Deep Breathing, Blood Transfusion Information, Total Joint Packet, MRSA Information and Surgical Site Infection Prevention

## 2017-07-11 NOTE — Progress Notes (Signed)
Pt denies SOB and chest pain. Pt under the care of Nigel Bridgeman, NP, Cardiology at [facility]; requested EKG tracing. Pt stated that an A1c was drawn recently; results requested from PCP, Dr. Carol Ada of [place] [facility]. Left voice message with Cecille Rubin, Surgical Coordinator, regarding clarification for consent order and pre-op Aspirin instructions; awaiting a return call. Pt chart forwarded to anesthesia for review of history and abnormal labs ( creatinine 8.65).

## 2017-07-12 ENCOUNTER — Encounter (HOSPITAL_COMMUNITY): Payer: Self-pay

## 2017-07-12 DIAGNOSIS — N186 End stage renal disease: Secondary | ICD-10-CM | POA: Diagnosis not present

## 2017-07-12 DIAGNOSIS — E1129 Type 2 diabetes mellitus with other diabetic kidney complication: Secondary | ICD-10-CM | POA: Diagnosis not present

## 2017-07-12 DIAGNOSIS — D509 Iron deficiency anemia, unspecified: Secondary | ICD-10-CM | POA: Diagnosis not present

## 2017-07-12 DIAGNOSIS — D631 Anemia in chronic kidney disease: Secondary | ICD-10-CM | POA: Diagnosis not present

## 2017-07-12 DIAGNOSIS — E8770 Fluid overload, unspecified: Secondary | ICD-10-CM | POA: Diagnosis not present

## 2017-07-12 DIAGNOSIS — N2581 Secondary hyperparathyroidism of renal origin: Secondary | ICD-10-CM | POA: Diagnosis not present

## 2017-07-12 NOTE — Progress Notes (Signed)
Anesthesia Chart Review:  Pt is a 56 year old male scheduled for R total hip arthroplasty anterior approach on 07/22/2017 with Rod Can, MD  - PCP is Carol Ada, MD who cleared pt for surgery  - Cardiology care with [facility] . Last office visit 05/21/17 with Para Skeans, NP (notes in care everywhere)   - Nephrology care with [facility]. Pt cleared for surgery by Stephania Fragmin, PA in that clinic  PMH includes:   - S/p heart transplant (2014).  - hyperlipidemia, OSA, stroke, ESRD on hemodialysis. - Prior to heart transplant, had CHF, nonischemic cardiomyopathy, AICD, atrial fibrillation, LBBB.  - Also hx of HTN (no longer on meds), DM (no longer on meds)  - Never smoker. BMI 38.  - S/p sleeve gastrectomy 11/14/16 (at [facility]). S/p gastric banding.   Medications include: ASA 81 mg, cyclosporine, myfortic, Prilosec, pravastatin. Pt to take anti-rejection meds morning of surgery  BP 118/68   Pulse 85   Temp 36.8 C   Resp 20   Ht 6\' 5"  (1.956 m)   Wt (!) 318 lb 11.2 oz (144.6 kg)   SpO2 97%   BMI 37.79 kg/m   Preoperative labs reviewed.   - glucose 79. HbA1c as 5.3 06/13/17 at PCP's office.   CXR 07/26/16:  - Since the previous study the patient has undergone previous median sternotomy. There has been removal of the ICD as well. - There is cardiomegaly with mild central pulmonary vascular congestion but no definite pulmonary edema. - There is hilar prominence on the right more conspicuous than in the past not felt to merely be due to patient rotation. Chest CT scanning with contrast is recommended to lymphadenopathy or hilar mass. - Linear density at the left lung base suggestive of atelectasis. This is new since the previous study. This too could be evaluated with the aforementioned chest CT scan.  Cardiac cath 05/21/17 (care everywhere): - Insignificant coronary artery disease  Echo 05/21/17 (care everywhere):  1. Normal LV systolic  function with moderate LVH. EF >55%. 2. Normal RV systolic function. 3. Valvular regurgitation: Mild MR, mild PR, trivial TR. 4. No valvular stenosis. 5.  Mild posteriorly directed MR 6. Insufficient TR to estimate RVSP  EKG 11/14/16 ([facility]): Sinus rhythm with marked sinus arrhythmia. LAFB. RBBB.   If no changes, I anticipate pt can proceed with surgery as scheduled.   Willeen Cass, FNP-BC [facility]/Anesthesiology Phone: [phone] 07/12/2017 2:16 PM

## 2017-07-14 DIAGNOSIS — N186 End stage renal disease: Secondary | ICD-10-CM | POA: Diagnosis not present

## 2017-07-14 DIAGNOSIS — D509 Iron deficiency anemia, unspecified: Secondary | ICD-10-CM | POA: Diagnosis not present

## 2017-07-14 DIAGNOSIS — D631 Anemia in chronic kidney disease: Secondary | ICD-10-CM | POA: Diagnosis not present

## 2017-07-14 DIAGNOSIS — E8770 Fluid overload, unspecified: Secondary | ICD-10-CM | POA: Diagnosis not present

## 2017-07-14 DIAGNOSIS — E1129 Type 2 diabetes mellitus with other diabetic kidney complication: Secondary | ICD-10-CM | POA: Diagnosis not present

## 2017-07-14 DIAGNOSIS — N2581 Secondary hyperparathyroidism of renal origin: Secondary | ICD-10-CM | POA: Diagnosis not present

## 2017-07-16 DIAGNOSIS — D631 Anemia in chronic kidney disease: Secondary | ICD-10-CM | POA: Diagnosis not present

## 2017-07-16 DIAGNOSIS — E1129 Type 2 diabetes mellitus with other diabetic kidney complication: Secondary | ICD-10-CM | POA: Diagnosis not present

## 2017-07-16 DIAGNOSIS — D509 Iron deficiency anemia, unspecified: Secondary | ICD-10-CM | POA: Diagnosis not present

## 2017-07-16 DIAGNOSIS — N186 End stage renal disease: Secondary | ICD-10-CM | POA: Diagnosis not present

## 2017-07-16 DIAGNOSIS — N2581 Secondary hyperparathyroidism of renal origin: Secondary | ICD-10-CM | POA: Diagnosis not present

## 2017-07-16 DIAGNOSIS — E8770 Fluid overload, unspecified: Secondary | ICD-10-CM | POA: Diagnosis not present

## 2017-07-16 NOTE — Progress Notes (Signed)
Called the office of surgeon to f/u with pre-op instructions regarding Aspirin. According to the receptionist, Cecille Rubin, Surgical Coordinator is out of the office today. However, the nurse will ask Dr. Lyla Glassing and f/u with the pt.

## 2017-07-19 DIAGNOSIS — E8770 Fluid overload, unspecified: Secondary | ICD-10-CM | POA: Diagnosis not present

## 2017-07-19 DIAGNOSIS — E1129 Type 2 diabetes mellitus with other diabetic kidney complication: Secondary | ICD-10-CM | POA: Diagnosis not present

## 2017-07-19 DIAGNOSIS — N2581 Secondary hyperparathyroidism of renal origin: Secondary | ICD-10-CM | POA: Diagnosis not present

## 2017-07-19 DIAGNOSIS — N186 End stage renal disease: Secondary | ICD-10-CM | POA: Diagnosis not present

## 2017-07-19 DIAGNOSIS — D631 Anemia in chronic kidney disease: Secondary | ICD-10-CM | POA: Diagnosis not present

## 2017-07-19 DIAGNOSIS — D509 Iron deficiency anemia, unspecified: Secondary | ICD-10-CM | POA: Diagnosis not present

## 2017-07-19 MED ORDER — TRANEXAMIC ACID 1000 MG/10ML IV SOLN
1000.0000 mg | INTRAVENOUS | Status: DC
  Filled 2017-07-19: qty 10

## 2017-07-19 MED ORDER — CLINDAMYCIN PHOSPHATE 900 MG/50ML IV SOLN
900.0000 mg | INTRAVENOUS | Status: AC
  Administered 2017-07-22: 900 mg via INTRAVENOUS
  Filled 2017-07-19: qty 50

## 2017-07-19 MED ORDER — ACETAMINOPHEN 10 MG/ML IV SOLN: 1000.0000 mg | INTRAVENOUS | Status: DC

## 2017-07-19 MED ORDER — SODIUM CHLORIDE 0.9 % IV SOLN
INTRAVENOUS | Status: DC
  Administered 2017-07-22: 07:00:00 via INTRAVENOUS

## 2017-07-20 DIAGNOSIS — D631 Anemia in chronic kidney disease: Secondary | ICD-10-CM | POA: Diagnosis not present

## 2017-07-20 DIAGNOSIS — E1129 Type 2 diabetes mellitus with other diabetic kidney complication: Secondary | ICD-10-CM | POA: Diagnosis not present

## 2017-07-20 DIAGNOSIS — N2581 Secondary hyperparathyroidism of renal origin: Secondary | ICD-10-CM | POA: Diagnosis not present

## 2017-07-20 DIAGNOSIS — N186 End stage renal disease: Secondary | ICD-10-CM | POA: Diagnosis not present

## 2017-07-20 DIAGNOSIS — E8770 Fluid overload, unspecified: Secondary | ICD-10-CM | POA: Diagnosis not present

## 2017-07-20 DIAGNOSIS — D509 Iron deficiency anemia, unspecified: Secondary | ICD-10-CM | POA: Diagnosis not present

## 2017-07-22 ENCOUNTER — Encounter (HOSPITAL_COMMUNITY)
Admission: RE | Disposition: A | Discharge status: Home Health | Payer: Self-pay | Source: Ambulatory Visit | Attending: Orthopedic Surgery

## 2017-07-22 ENCOUNTER — Encounter (HOSPITAL_COMMUNITY): Payer: Self-pay | Admitting: General Practice

## 2017-07-22 ENCOUNTER — Inpatient Hospital Stay (HOSPITAL_COMMUNITY): Payer: Medicare Other | Admitting: Certified Registered"

## 2017-07-22 ENCOUNTER — Inpatient Hospital Stay (HOSPITAL_COMMUNITY): Payer: Medicare Other

## 2017-07-22 ENCOUNTER — Inpatient Hospital Stay (HOSPITAL_COMMUNITY)
Admission: RE | Admit: 2017-07-22 | Discharge: 2017-07-24 | DRG: 469 | Disposition: A | Discharge status: Home Health | Payer: Medicare Other | Source: Ambulatory Visit | Attending: Orthopedic Surgery | Admitting: Orthopedic Surgery

## 2017-07-22 ENCOUNTER — Other Ambulatory Visit: Payer: Self-pay

## 2017-07-22 ENCOUNTER — Inpatient Hospital Stay (HOSPITAL_COMMUNITY): Payer: Medicare Other | Admitting: Emergency Medicine

## 2017-07-22 DIAGNOSIS — E669 Obesity, unspecified: Secondary | ICD-10-CM | POA: Diagnosis present

## 2017-07-22 DIAGNOSIS — Z7982 Long term (current) use of aspirin: Secondary | ICD-10-CM

## 2017-07-22 DIAGNOSIS — Z87891 Personal history of nicotine dependence: Secondary | ICD-10-CM | POA: Diagnosis not present

## 2017-07-22 DIAGNOSIS — Z888 Allergy status to other drugs, medicaments and biological substances status: Secondary | ICD-10-CM

## 2017-07-22 DIAGNOSIS — Z941 Heart transplant status: Secondary | ICD-10-CM

## 2017-07-22 DIAGNOSIS — D62 Acute posthemorrhagic anemia: Secondary | ICD-10-CM | POA: Diagnosis not present

## 2017-07-22 DIAGNOSIS — E8889 Other specified metabolic disorders: Secondary | ICD-10-CM | POA: Diagnosis present

## 2017-07-22 DIAGNOSIS — Z833 Family history of diabetes mellitus: Secondary | ICD-10-CM

## 2017-07-22 DIAGNOSIS — I255 Ischemic cardiomyopathy: Secondary | ICD-10-CM | POA: Diagnosis not present

## 2017-07-22 DIAGNOSIS — Z9884 Bariatric surgery status: Secondary | ICD-10-CM | POA: Diagnosis not present

## 2017-07-22 DIAGNOSIS — I10 Essential (primary) hypertension: Secondary | ICD-10-CM | POA: Diagnosis not present

## 2017-07-22 DIAGNOSIS — M25551 Pain in right hip: Secondary | ICD-10-CM | POA: Diagnosis not present

## 2017-07-22 DIAGNOSIS — I120 Hypertensive chronic kidney disease with stage 5 chronic kidney disease or end stage renal disease: Secondary | ICD-10-CM | POA: Diagnosis present

## 2017-07-22 DIAGNOSIS — E1122 Type 2 diabetes mellitus with diabetic chronic kidney disease: Secondary | ICD-10-CM | POA: Diagnosis present

## 2017-07-22 DIAGNOSIS — M1611 Unilateral primary osteoarthritis, right hip: Secondary | ICD-10-CM | POA: Diagnosis not present

## 2017-07-22 DIAGNOSIS — N2581 Secondary hyperparathyroidism of renal origin: Secondary | ICD-10-CM | POA: Diagnosis not present

## 2017-07-22 DIAGNOSIS — Z6830 Body mass index (BMI) 30.0-30.9, adult: Secondary | ICD-10-CM | POA: Diagnosis not present

## 2017-07-22 DIAGNOSIS — Z419 Encounter for procedure for purposes other than remedying health state, unspecified: Secondary | ICD-10-CM

## 2017-07-22 DIAGNOSIS — Z09 Encounter for follow-up examination after completed treatment for conditions other than malignant neoplasm: Secondary | ICD-10-CM

## 2017-07-22 DIAGNOSIS — Z992 Dependence on renal dialysis: Secondary | ICD-10-CM | POA: Diagnosis not present

## 2017-07-22 DIAGNOSIS — Z880 Allergy status to penicillin: Secondary | ICD-10-CM | POA: Diagnosis not present

## 2017-07-22 DIAGNOSIS — D631 Anemia in chronic kidney disease: Secondary | ICD-10-CM | POA: Diagnosis not present

## 2017-07-22 DIAGNOSIS — Z471 Aftercare following joint replacement surgery: Secondary | ICD-10-CM | POA: Diagnosis not present

## 2017-07-22 DIAGNOSIS — Z8673 Personal history of transient ischemic attack (TIA), and cerebral infarction without residual deficits: Secondary | ICD-10-CM

## 2017-07-22 DIAGNOSIS — Z8249 Family history of ischemic heart disease and other diseases of the circulatory system: Secondary | ICD-10-CM | POA: Diagnosis not present

## 2017-07-22 DIAGNOSIS — Z79899 Other long term (current) drug therapy: Secondary | ICD-10-CM

## 2017-07-22 DIAGNOSIS — I4891 Unspecified atrial fibrillation: Secondary | ICD-10-CM | POA: Diagnosis not present

## 2017-07-22 DIAGNOSIS — N186 End stage renal disease: Secondary | ICD-10-CM | POA: Diagnosis not present

## 2017-07-22 DIAGNOSIS — Z96641 Presence of right artificial hip joint: Secondary | ICD-10-CM | POA: Diagnosis not present

## 2017-07-22 HISTORY — PX: TOTAL KNEE ARTHROPLASTY: SHX125

## 2017-07-22 HISTORY — PX: TOTAL HIP ARTHROPLASTY: SHX124

## 2017-07-22 LAB — POCT I-STAT 4, (NA,K, GLUC, HGB,HCT)
GLUCOSE: 83 mg/dL (ref 65–99)
HEMATOCRIT: 30 % — AB (ref 39.0–52.0)
Hemoglobin: 10.2 g/dL — ABNORMAL LOW (ref 13.0–17.0)
Potassium: 4.8 mmol/L (ref 3.5–5.1)
Sodium: 141 mmol/L (ref 135–145)

## 2017-07-22 LAB — PREPARE RBC (CROSSMATCH)

## 2017-07-22 LAB — HEMOGLOBIN AND HEMATOCRIT, BLOOD
HEMATOCRIT: 31.9 % — AB (ref 39.0–52.0)
Hemoglobin: 10.1 g/dL — ABNORMAL LOW (ref 13.0–17.0)

## 2017-07-22 LAB — GLUCOSE, CAPILLARY: GLUCOSE-CAPILLARY: 98 mg/dL (ref 65–99)

## 2017-07-22 SURGERY — ARTHROPLASTY, HIP, TOTAL, ANTERIOR APPROACH
Anesthesia: Spinal | Site: Hip | Laterality: Right

## 2017-07-22 MED ORDER — LIDOCAINE 2% (20 MG/ML) 5 ML SYRINGE
INTRAMUSCULAR | Status: DC | PRN
  Administered 2017-07-22: 40 mg via INTRAVENOUS

## 2017-07-22 MED ORDER — RENA-VITE PO TABS
1.0000 | ORAL_TABLET | Freq: Every day | ORAL | Status: DC
  Administered 2017-07-22 – 2017-07-23 (×2): 1 via ORAL
  Filled 2017-07-22 (×2): qty 1

## 2017-07-22 MED ORDER — CLINDAMYCIN PHOSPHATE 600 MG/50ML IV SOLN
600.0000 mg | Freq: Four times a day (QID) | INTRAVENOUS | Status: AC
  Administered 2017-07-22 (×2): 600 mg via INTRAVENOUS
  Filled 2017-07-22 (×2): qty 50

## 2017-07-22 MED ORDER — METOCLOPRAMIDE HCL 5 MG/ML IJ SOLN: 5.0000 mg | Freq: Three times a day (TID) | INTRAMUSCULAR | Status: DC | PRN

## 2017-07-22 MED ORDER — CYCLOSPORINE MODIFIED (NEORAL) 100 MG PO CAPS
100.0000 mg | ORAL_CAPSULE | Freq: Two times a day (BID) | ORAL | Status: DC
  Administered 2017-07-22 – 2017-07-23 (×3): 100 mg via ORAL
  Filled 2017-07-22 (×4): qty 1

## 2017-07-22 MED ORDER — PROPOFOL 10 MG/ML IV BOLUS
INTRAVENOUS | Status: DC | PRN
  Administered 2017-07-22: 30 mg via INTRAVENOUS

## 2017-07-22 MED ORDER — PHENYLEPHRINE HCL 10 MG/ML IJ SOLN
INTRAVENOUS | Status: DC | PRN
  Administered 2017-07-22: 50 ug/min via INTRAVENOUS

## 2017-07-22 MED ORDER — CHLORHEXIDINE GLUCONATE 4 % EX LIQD: 60.0000 mL | Freq: Once | CUTANEOUS | Status: DC

## 2017-07-22 MED ORDER — MENTHOL 3 MG MT LOZG: 1.0000 | LOZENGE | OROMUCOSAL | Status: DC | PRN

## 2017-07-22 MED ORDER — PHENOL 1.4 % MT LIQD: 1.0000 | OROMUCOSAL | Status: DC | PRN

## 2017-07-22 MED ORDER — VENLAFAXINE HCL 37.5 MG PO TABS: 37.5000 mg | ORAL_TABLET | Freq: Every day | ORAL | Status: DC

## 2017-07-22 MED ORDER — HYDROMORPHONE HCL 1 MG/ML IJ SOLN
INTRAMUSCULAR | Status: AC
  Administered 2017-07-22: 0.5 mg via INTRAVENOUS
  Filled 2017-07-22: qty 1

## 2017-07-22 MED ORDER — FENTANYL CITRATE (PF) 100 MCG/2ML IJ SOLN: INTRAMUSCULAR | Status: DC | PRN

## 2017-07-22 MED ORDER — SODIUM CHLORIDE 0.9 % IJ SOLN
INTRAMUSCULAR | Status: DC | PRN
  Administered 2017-07-22 (×3): 10 mL

## 2017-07-22 MED ORDER — DOCUSATE SODIUM 100 MG PO CAPS
100.0000 mg | ORAL_CAPSULE | Freq: Two times a day (BID) | ORAL | Status: DC
  Administered 2017-07-22 – 2017-07-23 (×3): 100 mg via ORAL
  Filled 2017-07-22 (×3): qty 1

## 2017-07-22 MED ORDER — SEVELAMER CARBONATE 800 MG PO TABS: 1600.0000 mg | ORAL_TABLET | Freq: Three times a day (TID) | ORAL | Status: DC | PRN

## 2017-07-22 MED ORDER — OXYCODONE HCL 5 MG PO TABS
10.0000 mg | ORAL_TABLET | ORAL | Status: DC | PRN
  Administered 2017-07-22 – 2017-07-24 (×3): 10 mg via ORAL
  Filled 2017-07-22 (×4): qty 2

## 2017-07-22 MED ORDER — ONDANSETRON HCL 4 MG/2ML IJ SOLN: 4.0000 mg | Freq: Four times a day (QID) | INTRAMUSCULAR | Status: DC | PRN

## 2017-07-22 MED ORDER — FENTANYL CITRATE (PF) 250 MCG/5ML IJ SOLN
INTRAMUSCULAR | Status: DC | PRN
  Administered 2017-07-22: 50 ug via INTRAVENOUS

## 2017-07-22 MED ORDER — ONDANSETRON HCL 4 MG/2ML IJ SOLN
INTRAMUSCULAR | Status: AC
  Filled 2017-07-22: qty 2

## 2017-07-22 MED ORDER — TRANEXAMIC ACID 1000 MG/10ML IV SOLN
INTRAVENOUS | Status: AC | PRN
  Administered 2017-07-22: 1000 mg via INTRAVENOUS

## 2017-07-22 MED ORDER — KETOROLAC TROMETHAMINE 30 MG/ML IJ SOLN
INTRAMUSCULAR | Status: AC
  Filled 2017-07-22: qty 1

## 2017-07-22 MED ORDER — DIPHENHYDRAMINE HCL 12.5 MG/5ML PO ELIX: 12.5000 mg | ORAL_SOLUTION | ORAL | Status: DC | PRN

## 2017-07-22 MED ORDER — OXYCODONE HCL 5 MG PO TABS: 5.0000 mg | ORAL_TABLET | Freq: Once | ORAL | Status: DC | PRN

## 2017-07-22 MED ORDER — CITALOPRAM HYDROBROMIDE 20 MG PO TABS
20.0000 mg | ORAL_TABLET | Freq: Every day | ORAL | Status: DC
  Administered 2017-07-23: 20 mg via ORAL
  Filled 2017-07-22 (×3): qty 1

## 2017-07-22 MED ORDER — RENO CAPS 1 MG PO CAPS: 1.0000 | ORAL_CAPSULE | Freq: Every day | ORAL | Status: DC

## 2017-07-22 MED ORDER — SEVELAMER CARBONATE 800 MG PO TABS
3200.0000 mg | ORAL_TABLET | Freq: Three times a day (TID) | ORAL | Status: DC
  Administered 2017-07-22 – 2017-07-23 (×3): 3200 mg via ORAL
  Filled 2017-07-22 (×4): qty 4

## 2017-07-22 MED ORDER — PROPOFOL 10 MG/ML IV BOLUS
INTRAVENOUS | Status: AC
  Filled 2017-07-22: qty 20

## 2017-07-22 MED ORDER — ALUM & MAG HYDROXIDE-SIMETH 200-200-20 MG/5ML PO SUSP: 30.0000 mL | ORAL | Status: DC | PRN

## 2017-07-22 MED ORDER — SODIUM CHLORIDE 0.9 % IV SOLN
INTRAVENOUS | Status: DC
  Administered 2017-07-22: 17:00:00 via INTRAVENOUS

## 2017-07-22 MED ORDER — PROMETHAZINE HCL 25 MG/ML IJ SOLN: 6.2500 mg | INTRAMUSCULAR | Status: DC | PRN

## 2017-07-22 MED ORDER — BUPIVACAINE HCL (PF) 0.75 % IJ SOLN
INTRAMUSCULAR | Status: DC | PRN
  Administered 2017-07-22: 1.6 mL via INTRATHECAL

## 2017-07-22 MED ORDER — ACETAMINOPHEN 325 MG PO TABS: 650.0000 mg | ORAL_TABLET | ORAL | Status: DC | PRN

## 2017-07-22 MED ORDER — PHENYLEPHRINE 40 MCG/ML (10ML) SYRINGE FOR IV PUSH (FOR BLOOD PRESSURE SUPPORT)
PREFILLED_SYRINGE | INTRAVENOUS | Status: DC | PRN
  Administered 2017-07-22: 80 ug via INTRAVENOUS

## 2017-07-22 MED ORDER — HYDROMORPHONE HCL 1 MG/ML IJ SOLN: 0.5000 mg | INTRAMUSCULAR | Status: DC | PRN

## 2017-07-22 MED ORDER — MIDAZOLAM HCL 5 MG/5ML IJ SOLN
INTRAMUSCULAR | Status: DC | PRN
  Administered 2017-07-22: 2 mg via INTRAVENOUS

## 2017-07-22 MED ORDER — KETOROLAC TROMETHAMINE 30 MG/ML IJ SOLN
INTRAMUSCULAR | Status: DC | PRN
  Administered 2017-07-22: 30 mg via INTRA_ARTICULAR

## 2017-07-22 MED ORDER — METOCLOPRAMIDE HCL 5 MG PO TABS: 5.0000 mg | ORAL_TABLET | Freq: Three times a day (TID) | ORAL | Status: DC | PRN

## 2017-07-22 MED ORDER — PROPOFOL 500 MG/50ML IV EMUL
INTRAVENOUS | Status: DC | PRN
  Administered 2017-07-22: 50 ug/kg/min via INTRAVENOUS

## 2017-07-22 MED ORDER — FENTANYL CITRATE (PF) 250 MCG/5ML IJ SOLN
INTRAMUSCULAR | Status: AC
  Filled 2017-07-22: qty 5

## 2017-07-22 MED ORDER — SODIUM CHLORIDE 0.9 % IR SOLN
Status: DC | PRN
  Administered 2017-07-22: 1000 mL
  Administered 2017-07-22: 3000 mL

## 2017-07-22 MED ORDER — POVIDONE-IODINE 10 % EX SWAB: 2.0000 "application " | Freq: Once | CUTANEOUS | Status: DC

## 2017-07-22 MED ORDER — DOXERCALCIFEROL 4 MCG/2ML IV SOLN
10.0000 ug | INTRAVENOUS | Status: DC
  Administered 2017-07-24: 10 ug via INTRAVENOUS
  Filled 2017-07-22: qty 6

## 2017-07-22 MED ORDER — DARBEPOETIN ALFA 100 MCG/0.5ML IJ SOSY
100.0000 ug | PREFILLED_SYRINGE | INTRAMUSCULAR | Status: DC
  Administered 2017-07-24: 100 ug via INTRAVENOUS
  Filled 2017-07-22: qty 0.5

## 2017-07-22 MED ORDER — BUPIVACAINE-EPINEPHRINE (PF) 0.5% -1:200000 IJ SOLN
INTRAMUSCULAR | Status: DC | PRN
  Administered 2017-07-22: 30 mL

## 2017-07-22 MED ORDER — PHENYLEPHRINE 40 MCG/ML (10ML) SYRINGE FOR IV PUSH (FOR BLOOD PRESSURE SUPPORT)
PREFILLED_SYRINGE | INTRAVENOUS | Status: AC
  Filled 2017-07-22: qty 10

## 2017-07-22 MED ORDER — 0.9 % SODIUM CHLORIDE (POUR BTL) OPTIME
TOPICAL | Status: DC | PRN
  Administered 2017-07-22: 1000 mL

## 2017-07-22 MED ORDER — HYDROMORPHONE HCL 1 MG/ML IJ SOLN
0.2500 mg | INTRAMUSCULAR | Status: DC | PRN
  Administered 2017-07-22 (×4): 0.5 mg via INTRAVENOUS

## 2017-07-22 MED ORDER — ONDANSETRON HCL 4 MG PO TABS: 4.0000 mg | ORAL_TABLET | Freq: Four times a day (QID) | ORAL | Status: DC | PRN

## 2017-07-22 MED ORDER — PANTOPRAZOLE SODIUM 40 MG PO TBEC
40.0000 mg | DELAYED_RELEASE_TABLET | Freq: Every day | ORAL | Status: DC
  Administered 2017-07-22 – 2017-07-23 (×2): 40 mg via ORAL
  Filled 2017-07-22 (×3): qty 1

## 2017-07-22 MED ORDER — SODIUM CHLORIDE 0.9 % IV SOLN: 10.0000 mL/h | Freq: Once | INTRAVENOUS | Status: DC

## 2017-07-22 MED ORDER — METHOCARBAMOL 500 MG PO TABS
500.0000 mg | ORAL_TABLET | Freq: Four times a day (QID) | ORAL | Status: DC | PRN
  Administered 2017-07-23 – 2017-07-24 (×2): 500 mg via ORAL
  Filled 2017-07-22 (×2): qty 1

## 2017-07-22 MED ORDER — ONDANSETRON HCL 4 MG/2ML IJ SOLN
INTRAMUSCULAR | Status: DC | PRN
  Administered 2017-07-22: 4 mg via INTRAVENOUS

## 2017-07-22 MED ORDER — PRAVASTATIN SODIUM 20 MG PO TABS
20.0000 mg | ORAL_TABLET | Freq: Every day | ORAL | Status: DC
  Administered 2017-07-22 – 2017-07-23 (×2): 20 mg via ORAL
  Filled 2017-07-22 (×2): qty 1

## 2017-07-22 MED ORDER — OXYCODONE HCL 5 MG/5ML PO SOLN: 5.0000 mg | Freq: Once | ORAL | Status: DC | PRN

## 2017-07-22 MED ORDER — ACETAMINOPHEN 650 MG RE SUPP: 650.0000 mg | RECTAL | Status: DC | PRN

## 2017-07-22 MED ORDER — METHOCARBAMOL 1000 MG/10ML IJ SOLN
500.0000 mg | Freq: Four times a day (QID) | INTRAVENOUS | Status: DC | PRN
  Filled 2017-07-22: qty 5

## 2017-07-22 MED ORDER — ASPIRIN 81 MG PO CHEW
81.0000 mg | CHEWABLE_TABLET | Freq: Two times a day (BID) | ORAL | Status: DC
  Administered 2017-07-22 – 2017-07-23 (×3): 81 mg via ORAL
  Filled 2017-07-22 (×3): qty 1

## 2017-07-22 MED ORDER — BUPIVACAINE-EPINEPHRINE (PF) 0.5% -1:200000 IJ SOLN
INTRAMUSCULAR | Status: AC
  Filled 2017-07-22: qty 30

## 2017-07-22 MED ORDER — POLYETHYLENE GLYCOL 3350 17 G PO PACK: 17.0000 g | PACK | Freq: Every day | ORAL | Status: DC | PRN

## 2017-07-22 MED ORDER — LIDOCAINE 2% (20 MG/ML) 5 ML SYRINGE
INTRAMUSCULAR | Status: AC
  Filled 2017-07-22: qty 5

## 2017-07-22 MED ORDER — SENNA 8.6 MG PO TABS
2.0000 | ORAL_TABLET | Freq: Every day | ORAL | Status: DC
  Administered 2017-07-22: 17.2 mg via ORAL
  Filled 2017-07-22: qty 2

## 2017-07-22 MED ORDER — TEMAZEPAM 15 MG PO CAPS
15.0000 mg | ORAL_CAPSULE | Freq: Every day | ORAL | Status: DC
  Administered 2017-07-22 – 2017-07-23 (×2): 15 mg via ORAL
  Filled 2017-07-22 (×2): qty 1

## 2017-07-22 MED ORDER — MYCOPHENOLATE SODIUM 180 MG PO TBEC
720.0000 mg | DELAYED_RELEASE_TABLET | Freq: Two times a day (BID) | ORAL | Status: DC
  Administered 2017-07-23 (×2): 720 mg via ORAL
  Filled 2017-07-22 (×5): qty 4

## 2017-07-22 MED ORDER — MIDAZOLAM HCL 2 MG/2ML IJ SOLN
INTRAMUSCULAR | Status: AC
  Filled 2017-07-22: qty 2

## 2017-07-22 MED ORDER — OXYCODONE HCL 5 MG PO TABS
5.0000 mg | ORAL_TABLET | ORAL | Status: DC | PRN
  Administered 2017-07-23 – 2017-07-24 (×2): 5 mg via ORAL
  Filled 2017-07-22 (×2): qty 1

## 2017-07-22 SURGICAL SUPPLY — 53 items
ADH SKN CLS APL DERMABOND .7 (GAUZE/BANDAGES/DRESSINGS) ×2
ALCOHOL ISOPROPYL (RUBBING) (MISCELLANEOUS) ×3 IMPLANT
BLADE CLIPPER SURG (BLADE) IMPLANT
CAPT HIP TOTAL 2 ×2 IMPLANT
CHLORAPREP W/TINT 26ML (MISCELLANEOUS) ×3 IMPLANT
COVER SURGICAL LIGHT HANDLE (MISCELLANEOUS) ×3 IMPLANT
DERMABOND ADVANCED (GAUZE/BANDAGES/DRESSINGS) ×4
DERMABOND ADVANCED .7 DNX12 (GAUZE/BANDAGES/DRESSINGS) ×2 IMPLANT
DRAPE C-ARM 42X72 X-RAY (DRAPES) ×3 IMPLANT
DRAPE STERI IOBAN 125X83 (DRAPES) ×3 IMPLANT
DRAPE U-SHAPE 47X51 STRL (DRAPES) ×9 IMPLANT
DRSG AQUACEL AG ADV 3.5X10 (GAUZE/BANDAGES/DRESSINGS) ×3 IMPLANT
ELECT BLADE 4.0 EZ CLEAN MEGAD (MISCELLANEOUS) ×3
ELECT REM PT RETURN 9FT ADLT (ELECTROSURGICAL) ×3
ELECTRODE BLDE 4.0 EZ CLN MEGD (MISCELLANEOUS) ×1 IMPLANT
ELECTRODE REM PT RTRN 9FT ADLT (ELECTROSURGICAL) ×1 IMPLANT
EVACUATOR 1/8 PVC DRAIN (DRAIN) IMPLANT
GLOVE BIO SURGEON STRL SZ8.5 (GLOVE) ×6 IMPLANT
GLOVE BIOGEL PI IND STRL 8.5 (GLOVE) ×1 IMPLANT
GLOVE BIOGEL PI INDICATOR 8.5 (GLOVE) ×2
GOWN STRL REUS W/ TWL LRG LVL3 (GOWN DISPOSABLE) ×2 IMPLANT
GOWN STRL REUS W/TWL 2XL LVL3 (GOWN DISPOSABLE) ×5 IMPLANT
GOWN STRL REUS W/TWL LRG LVL3 (GOWN DISPOSABLE) ×3
HANDPIECE INTERPULSE COAX TIP (DISPOSABLE) ×3
HOOD PEEL AWAY FACE SHEILD DIS (HOOD) ×6 IMPLANT
KIT BASIN OR (CUSTOM PROCEDURE TRAY) ×3 IMPLANT
KIT ROOM TURNOVER OR (KITS) ×3 IMPLANT
MANIFOLD NEPTUNE II (INSTRUMENTS) ×3 IMPLANT
MARKER SKIN DUAL TIP RULER LAB (MISCELLANEOUS) ×6 IMPLANT
NDL SPNL 18GX3.5 QUINCKE PK (NEEDLE) ×1 IMPLANT
NEEDLE SPNL 18GX3.5 QUINCKE PK (NEEDLE) ×3 IMPLANT
NS IRRIG 1000ML POUR BTL (IV SOLUTION) ×3 IMPLANT
PACK TOTAL JOINT (CUSTOM PROCEDURE TRAY) ×3 IMPLANT
PACK UNIVERSAL I (CUSTOM PROCEDURE TRAY) ×3 IMPLANT
PAD ARMBOARD 7.5X6 YLW CONV (MISCELLANEOUS) ×6 IMPLANT
SAW OSC TIP CART 19.5X105X1.3 (SAW) ×3 IMPLANT
SEALER BIPOLAR AQUA 6.0 (INSTRUMENTS) ×2 IMPLANT
SET HNDPC FAN SPRY TIP SCT (DISPOSABLE) ×1 IMPLANT
SOL PREP POV-IOD 4OZ 10% (MISCELLANEOUS) ×3 IMPLANT
SUT ETHIBOND NAB CT1 #1 30IN (SUTURE) ×6 IMPLANT
SUT MNCRL AB 3-0 PS2 18 (SUTURE) ×3 IMPLANT
SUT MON AB 2-0 CT1 36 (SUTURE) ×3 IMPLANT
SUT VIC AB 1 CT1 27 (SUTURE) ×3
SUT VIC AB 1 CT1 27XBRD ANBCTR (SUTURE) ×1 IMPLANT
SUT VIC AB 2-0 CT1 27 (SUTURE) ×3
SUT VIC AB 2-0 CT1 TAPERPNT 27 (SUTURE) ×1 IMPLANT
SUT VLOC 180 0 24IN GS25 (SUTURE) ×3 IMPLANT
SYR 50ML LL SCALE MARK (SYRINGE) ×3 IMPLANT
TOWEL OR 17X24 6PK STRL BLUE (TOWEL DISPOSABLE) ×3 IMPLANT
TOWEL OR 17X26 10 PK STRL BLUE (TOWEL DISPOSABLE) ×3 IMPLANT
TRAY CATH 16FR W/PLASTIC CATH (SET/KITS/TRAYS/PACK) ×2 IMPLANT
TRAY FOLEY CATH SILVER 16FR (SET/KITS/TRAYS/PACK) IMPLANT
WATER STERILE IRR 1000ML POUR (IV SOLUTION) ×9 IMPLANT

## 2017-07-22 NOTE — Interval H&P Note (Signed)
History and Physical Interval Note:  07/22/2017 7:29 AM  [name]  has presented today for surgery, with the diagnosis of Degenerative joint disease right hip  The various methods of treatment have been discussed with the patient and family. After consideration of risks, benefits and other options for treatment, the patient has consented to  Procedure(s) with comments: RIGHT TOTAL HIP ARTHROPLASTY ANTERIOR APPROACH (Right) - Needs RNFA as a surgical intervention .  The patient's history has been reviewed, patient examined, no change in status, stable for surgery.  I have reviewed the patient's chart and labs.  Questions were answered to the patient's satisfaction.     Hilton Cork Keon Pender

## 2017-07-22 NOTE — Discharge Instructions (Signed)
°Dr. Dezhane Staten °Joint Replacement Specialist °[facility] °[address] °[place], [place] [address] °[phone] ° ° °TOTAL HIP REPLACEMENT POSTOPERATIVE DIRECTIONS ° ° ° °Hip Rehabilitation, Guidelines Following Surgery  ° °WEIGHT BEARING °Weight bearing as tolerated with assist device (walker, cane, etc) as directed, use it as long as suggested by your surgeon or therapist, typically at least 4-6 weeks. ° °The results of a hip operation are greatly improved after range of motion and muscle strengthening exercises. Follow all safety measures which are given to protect your hip. If any of these exercises cause increased pain or swelling in your joint, decrease the amount until you are comfortable again. Then slowly increase the exercises. Call your caregiver if you have problems or questions.  ° °HOME CARE INSTRUCTIONS  °Most of the following instructions are designed to prevent the dislocation of your new hip.  °Remove items at home which could result in a fall. This includes throw rugs or furniture in walking pathways.  °Continue medications as instructed at time of discharge. °· You may have some home medications which will be placed on hold until you complete the course of blood thinner medication. °· You may start showering once you are discharged home. Do not remove your dressing. °Do not put on socks or shoes without following the instructions of your caregivers.   °Sit on chairs with arms. Use the chair arms to help push yourself up when arising.  °Arrange for the use of a toilet seat elevator so you are not sitting low.  °· Walk with walker as instructed.  °You may resume a sexual relationship in one month or when given the OK by your caregiver.  °Use walker as long as suggested by your caregivers.  °You may put full weight on your legs and walk as much as is comfortable. °Avoid periods of inactivity such as sitting longer than an hour when not asleep. This helps prevent  blood clots.  °You may return to work once you are cleared by your surgeon.  °Do not drive a car for 6 weeks or until released by your surgeon.  °Do not drive while taking narcotics.  °Wear elastic stockings for two weeks following surgery during the day but you may remove then at night.  °Make sure you keep all of your appointments after your operation with all of your doctors and caregivers. You should call the office at the above phone number and make an appointment for approximately two weeks after the date of your surgery. °Please pick up a stool softener and laxative for home use as long as you are requiring pain medications. °· ICE to the affected hip every three hours for 30 minutes at a time and then as needed for pain and swelling. Continue to use ice on the hip for pain and swelling from surgery. You may notice swelling that will progress down to the foot and ankle.  This is normal after surgery.  Elevate the leg when you are not up walking on it.   °It is important for you to complete the blood thinner medication as prescribed by your doctor. °· Continue to use the breathing machine which will help keep your temperature down.  It is common for your temperature to cycle up and down following surgery, especially at night when you are not up moving around and exerting yourself.  The breathing machine keeps your lungs expanded and your temperature down. ° °RANGE OF MOTION AND STRENGTHENING EXERCISES  °These exercises are   designed to help you keep full movement of your hip joint. Follow your caregiver's or physical therapist's instructions. Perform all exercises about fifteen times, three times per day or as directed. Exercise both hips, even if you have had only one joint replacement. These exercises can be done on a training (exercise) mat, on the floor, on a table or on a bed. Use whatever works the best and is most comfortable for you. Use music or television while you are exercising so that the exercises  are a pleasant break in your day. This will make your life better with the exercises acting as a break in routine you can look forward to.  °Lying on your back, slowly slide your foot toward your buttocks, raising your knee up off the floor. Then slowly slide your foot back down until your leg is straight again.  °Lying on your back spread your legs as far apart as you can without causing discomfort.  °Lying on your side, raise your upper leg and foot straight up from the floor as far as is comfortable. Slowly lower the leg and repeat.  °Lying on your back, tighten up the muscle in the front of your thigh (quadriceps muscles). You can do this by keeping your leg straight and trying to raise your heel off the floor. This helps strengthen the largest muscle supporting your knee.  °Lying on your back, tighten up the muscles of your buttocks both with the legs straight and with the knee bent at a comfortable angle while keeping your heel on the floor.  ° °SKILLED REHAB INSTRUCTIONS: °If the patient is transferred to a skilled rehab facility following release from the hospital, a list of the current medications will be sent to the facility for the patient to continue.  When discharged from the skilled rehab facility, please have the facility set up the patient's Home Health Physical Therapy prior to being released. Also, the skilled facility will be responsible for providing the patient with their medications at time of release from the facility to include their pain medication and their blood thinner medication. If the patient is still at the rehab facility at time of the two week follow up appointment, the skilled rehab facility will also need to assist the patient in arranging follow up appointment in our office and any transportation needs. ° °MAKE SURE YOU:  °Understand these instructions.  °Will watch your condition.  °Will get help right away if you are not doing well or get worse. ° °Pick up stool softner and  laxative for home use following surgery while on pain medications. °Do not remove your dressing. °The dressing is waterproof--it is OK to take showers. °Continue to use ice for pain and swelling after surgery. °Do not use any lotions or creams on the incision until instructed by your surgeon. °Total Hip Protocol. ° ° °

## 2017-07-22 NOTE — Anesthesia Procedure Notes (Signed)
Spinal  Patient location during procedure: OR Start time: 07/22/2017 7:42 AM End time: 07/22/2017 7:47 AM Staffing Anesthesiologist: Lynda Rainwater, MD Performed: anesthesiologist  Preanesthetic Checklist Completed: patient identified, site marked, surgical consent, pre-op evaluation, timeout performed, IV checked, risks and benefits discussed and monitors and equipment checked Spinal Block Patient position: sitting Prep: Betadine Patient monitoring: heart rate, cardiac monitor, continuous pulse ox and blood pressure Approach: midline Location: L3-4 Injection technique: single-shot Needle Needle type: Quincke  Needle gauge: 22 G Needle length: 9 cm

## 2017-07-22 NOTE — Consult Note (Signed)
[facility] Renal Consultation Note  Indication for Consultation:  Management of ESRD/hemodialysis; anemia, hypertension/volume and secondary hyperparathyroidism  HPI: [name] is a 56 y.o. male with  ESRD 2/2 AKI following OHT for NICM 02/2013 progressed to ESRD d/c from [facility] to [facility]  Type 2 DM/HTN ^ lipids/hx lap band for morbid obesity/hx CVA anxiety depression,aquired von Willdebrand's dz/HIT +/left upper extrem DVT - previously on coumadin NO HEPARIN  and pancytopenia 10/14 - neg BMBx.        Admitted sp R Total  Hip Arthroplasty  Anter. approach  Dr.Swinteck ,last op HD was Saturday 07/20/17 on schedule and is complaint with HD.  No current cos.      Past Medical History:  Diagnosis Date  . Arthritis   . Atrial fibrillation ([place])   . CHF (congestive heart failure), NYHA class III (HCC)    s/p heart transplant  . Chronic kidney disease    end stage  . Chronic systolic dysfunction of left ventricle   . CVA (cerebral infarction)   . Diabetes mellitus    PMH; Prior to heart transplant  . Family history of coronary artery disease    in both parents  . Hyperlipidemia   . Hypertension   . Left bundle branch block   . Morbid obesity ([place])    status post lap band  . Myocardial infarction [facility])    prior to heart transplant  . Nonischemic cardiomyopathy ([place])    prior to heart transplant  . Obesity (BMI 30-39.9)   . Obstructive sleep apnea    no longer needs CPAP after heart transplant per pt  . Premature ventricular contractions   . SOB (shortness of breath)   . Stroke [facility])     Past Surgical History:  Procedure Laterality Date  . CARDIAC PACEMAKER PLACEMENT  09/21/2009   Biventricular implantable cardioverter-defibrillator implantation     . COLONOSCOPY W/ BIOPSIES AND POLYPECTOMY    . FOOT SURGERY     left  . HEART TRANSPLANT  2014  . LAPAROSCOPIC GASTRIC BANDING  01/27/2007  . LEFT VENTRICULAR ASSIST DEVICE     implanted at [facility]  History  Problem Relation Age of Onset  . Coronary artery disease Father        had, PTCA & CABG  . Heart attack Father   . Hypertension Father   . Diabetes Father   . Coronary artery disease Mother   . Heart attack Mother   . Hypertension Mother   . Stroke Mother   . Lupus Mother       reports that  has never smoked. He has quit using smokeless tobacco. His smokeless tobacco use included snuff. He reports that he does not drink alcohol or use drugs.   Allergies  Allergen Reactions  . Heparin Nausea Only, Swelling and Other (See Comments)     * * HIT * * SWELLING REACTION UNSPECIFIED  DIAPHORESIS  . Penicillin G Potassium [Penicillin G] Nausea Only and Other (See Comments)    DIAPHORESIS    Prior to Admission medications   Medication Sig Start Date End Date Taking? Authorizing Provider  aspirin 81 MG tablet Take 81 mg by mouth daily.   Yes [provider]  B Complex-C-Folic Acid ([place]ENO CAPS) 1 MG CAPS TAKE 1 CAPSULE BY MOUTH ONCE DAILY. 03/02/16  Yes [provider]  citalopram (CELEXA) 20 MG tablet TAKE 1 TABLET(20 MG) BY MOUTH EVERY DAY 04/06/16  Yes [provider]  cycloSPORINE modified (NEORAL) 100 MG capsule TK ONE C PO  BID 07/16/16  Yes [provider]  mycophenolate (MYFORTIC) 360 MG TBEC EC tablet TAKE 720 mg BY MOUTH TWICE DAILY 06/12/16  Yes [provider]  omeprazole (PRILOSEC) 20 MG capsule TAKE 20 mg BY MOUTH DAILY 04/06/16  Yes [provider]  pravastatin (PRAVACHOL) 20 MG tablet Take 20 mg by mouth daily.   Yes [provider]  sevelamer carbonate (RENVELA) 800 MG tablet Take 1,600-3,200 mg See admin instructions by mouth. Take 3200 mg with meals  Take 1600 mg with snacks   Yes [provider]  temazepam (RESTORIL) 15 MG capsule Take 15 mg at bedtime by mouth.  03/24/15  Yes [provider]  lidocaine (XYLOCAINE) 5 % ointment Apply 1 application topically as needed. Apply to feet twice  daily as needed. Patient not taking: Reported on 07/11/2017 01/03/17   Narda Amber K, DO  venlafaxine (EFFEXOR) 37.5 MG tablet Take half tablet x 1 month, then increase to 1 tablet daily.  On days of dialysis, take afterwards. Patient not taking: Reported on 07/11/2017 07/01/17   Alda Berthold, DO    DTO:[id number] **OR** acetaminophen, alum & mag hydroxide-simeth, diphenhydrAMINE, HYDROmorphone (DILAUDID) injection, menthol-cetylpyridinium **OR** phenol, methocarbamol **OR** methocarbamol (ROBAXIN)  IV, metoCLOPramide **OR** metoCLOPramide (REGLAN) injection, ondansetron **OR** ondansetron (ZOFRAN) IV, oxyCODONE, oxyCODONE, polyethylene glycol, sevelamer carbonate  Results for orders placed or performed during the hospital encounter of 07/22/17 (from the past 48 hour(s))  I-STAT 4, (NA,K, GLUC, HGB,HCT)     Status: Abnormal   Collection Time: 07/22/17  6:48 AM  Result Value Ref Range   Sodium 141 135 - 145 mmol/L   Potassium 4.8 3.5 - 5.1 mmol/L   Glucose, Bld 83 65 - 99 mg/dL   HCT 30.0 (L) 39.0 - 52.0 %   Hemoglobin 10.2 (L) 13.0 - 17.0 g/dL  Prepare RBC     Status: None   Collection Time: 07/22/17  9:14 AM  Result Value Ref Range   Order Confirmation ORDER PROCESSED BY BLOOD BANK   Glucose, capillary     Status: None   Collection Time: 07/22/17 11:21 AM  Result Value Ref Range   Glucose-Capillary 98 65 - 99 mg/dL   Comment 1 Notify RN    Comment 2 Document in Chart   Hemoglobin and hematocrit, blood     Status: Abnormal   Collection Time: 07/22/17 12:04 PM  Result Value Ref Range   Hemoglobin 10.1 (L) 13.0 - 17.0 g/dL   HCT 31.9 (L) 39.0 - 52.0 %     ROS: no positive reported except MS for hip discomfort /  schedule d  hip surgery admit   Physical Exam: Vitals:   07/22/17 1314 07/22/17 1336  BP: 107/63 106/66  Pulse: 76 77  Resp: 12 14  Temp: 97.8 F (36.6 C) 97.9 F (36.6 C)  SpO2: 93% 95%     General: Obese AAM, awoken from sleep  NAD ,apprpriate HEENT:    EOMI, Non icteric  Neck: no jvd , supple  Heart: RRR, no m,r,g Lungs: CTA , no labored gbreTINH G Abdomen: Obese, bs pos, soft , NT, ND Extremities = no pedal edema  Skin: no overt rash  Neuro: OX3, Alert , NF Dialysis Access: pos bruit R FA AVF   Dialysis: [facility] MWF 4h 55min   142.5kg    2/2 bath  RFA AVF  Hep none -Mircera 100 mcg q 2wks last on 07/10/17  -Hec 10 mcg  IV/HD   Assessment/Plan 1. ESRD -  HD  MWF scheilde / K 4.8 and vol ok / due to emergent cases  Will do hd tomor. 2. SP R. T. Hip Arthroplasty- per ortho 3. Hypertension/volume  -BP ok no  bp meds  4. Anemia  -hgb 10.1 fu hgb trend  Next ESA  This WED  aranesp 100 5. Metabolic bone disease -  Vit d on hd , binder is Renvela 6. SP Hrt Tx  2014- On meds stable  7. Ho Hit - no hep HD  8. DM type 2 -  9. Nutrition - Renal ,carb mod , renal vit   Ernest Haber, PA-C [place] [phone] 07/22/2017, 2:42 PM   Pt seen, examined and agree w A/P as above.  Kelly Splinter MD [employer] pager [phone]   07/22/2017, 3:54 PM

## 2017-07-22 NOTE — Transfer of Care (Signed)
Immediate Anesthesia Transfer of Care Note  Patient: [name]  Procedure(s) Performed: RIGHT TOTAL HIP ARTHROPLASTY ANTERIOR APPROACH (Right Hip)  Patient Location: PACU  Anesthesia Type:Spinal  Level of Consciousness: drowsy and patient cooperative  Airway & Oxygen Therapy: Patient Spontanous Breathing and Patient connected to face mask oxygen  Post-op Assessment: Report given to RN and Post -op Vital signs reviewed and stable  Post vital signs: Reviewed and stable  Last Vitals:  Vitals:   07/22/17 0620  BP: 122/72  Pulse: 70  Resp: 18  Temp: (!) 36.3 C  SpO2: 99%    Last Pain:  Vitals:   07/22/17 0636  TempSrc:   PainSc: 10-Worst pain ever      Patients Stated Pain Goal: 3 (91/91/66 0600)  Complications: No apparent anesthesia complications

## 2017-07-22 NOTE — Anesthesia Postprocedure Evaluation (Signed)
Anesthesia Post Note  Patient: [name]  Procedure(s) Performed: RIGHT TOTAL HIP ARTHROPLASTY ANTERIOR APPROACH (Right Hip)     Patient location during evaluation: PACU Anesthesia Type: Spinal Level of consciousness: oriented and awake and alert Pain management: pain level controlled Vital Signs Assessment: post-procedure vital signs reviewed and stable Respiratory status: spontaneous breathing and respiratory function stable Cardiovascular status: blood pressure returned to baseline and stable Postop Assessment: no headache, no backache and no apparent nausea or vomiting Anesthetic complications: no    Last Vitals:  Vitals:   07/22/17 1314 07/22/17 1336  BP: 107/63 106/66  Pulse: 76 77  Resp: 12 14  Temp: 36.6 C 36.6 C  SpO2: 93% 95%    Last Pain:  Vitals:   07/22/17 1343  TempSrc:   PainSc: [place]

## 2017-07-22 NOTE — Evaluation (Signed)
Physical Therapy Evaluation Patient Details Name: [name] MRN: [id number] DOB: Feb 06, 1961 Today's Date: 07/22/2017   History of Present Illness  Pt is a 56 y/o male s/p elective R THA. PMH includes a fib, CHF, CVA, DM, HTN, MI, nonischemic cardiomyopathy, ESRD on HD MWF.   Clinical Impression  Pt is s/p surgery above with deficits below. PTA, pt was having to use RW secondary to increased pain. Upon eval, pt presenting with post op pain and weakness, and also had some dizziness and nausea during gait which limited gait distance. RN notified and aware. Required min to min guard assist for mobility this session. Reports wife will be able to assist as needed upon d/c and will need DME below. Follow up recommendations per MD arrangements. Will continue to follow acutely to maximize functional mobility independence and safety.     Follow Up Recommendations DC plan and follow up therapy as arranged by surgeon;Supervision for mobility/OOB    Equipment Recommendations  3in1 (PT)(bariatric )    Recommendations for Other Services       Precautions / Restrictions Precautions Precautions: None Precaution Comments: Reviewed supine ther ex with pt.  Restrictions Weight Bearing Restrictions: Yes RLE Weight Bearing: Weight bearing as tolerated      Mobility  Bed Mobility Overal bed mobility: Needs Assistance Bed Mobility: Supine to Sit;Sit to Supine     Supine to sit: Min assist Sit to supine: Min assist   General bed mobility comments: Min A for RLE management during bed mobility. Required use of bed rails and elevated HOB.   Transfers Overall transfer level: Needs assistance Equipment used: Rolling walker (2 wheeled) Transfers: Sit to/from Stand Sit to Stand: Min assist         General transfer comment: Min A for steadying assist. Verbal cues for safe hand placement.   Ambulation/Gait Ambulation/Gait assistance: Min guard Ambulation Distance (Feet): 20 Feet Assistive  device: Rolling walker (2 wheeled) Gait Pattern/deviations: Step-to pattern;Decreased step length - right;Decreased step length - left;Decreased weight shift to right;Antalgic;Trunk flexed Gait velocity: Decreased Gait velocity interpretation: Below normal speed for age/gender General Gait Details: Slow, antalgic gait. Required cues for upright posture and proximity to device. Verbal cues for sequencing with RW. Pt with some dizziness and nausea in standing, therefore distance limited.   Stairs            Wheelchair Mobility    Modified Rankin (Stroke Patients Only)       Balance Overall balance assessment: Needs assistance Sitting-balance support: No upper extremity supported;Feet supported Sitting balance-Leahy Scale: Good     Standing balance support: Bilateral upper extremity supported;During functional activity Standing balance-Leahy Scale: Poor Standing balance comment: Reliant on UE support                              Pertinent Vitals/Pain Pain Assessment: 0-10 Pain Score: 5  Pain Location: R hip  Pain Descriptors / Indicators: Aching;Operative site guarding Pain Intervention(s): Limited activity within patient's tolerance;Monitored during session;Repositioned    Home Living Family/patient expects to be discharged to:: Private residence Living Arrangements: Spouse/significant other Available Help at Discharge: Family;Available 24 hours/day Type of Home: House Home Access: Stairs to enter Entrance Stairs-Rails: Right;Can reach Software engineer of Steps: 5 Home Layout: One level Home Equipment: Walker - 2 wheels      Prior Function Level of Independence: Independent with assistive device(s)         Comments: USed RW  secondary to pain      Hand Dominance   Dominant Hand: Right    Extremity/Trunk Assessment   Upper Extremity Assessment Upper Extremity Assessment: Overall WFL for tasks assessed    Lower Extremity  Assessment Lower Extremity Assessment: RLE deficits/detail RLE Deficits / Details: Sensory in tact. Deficits consistent with post op pain and weakness. Able to perform ther ex below.     Cervical / Trunk Assessment Cervical / Trunk Assessment: Normal  Communication   Communication: No difficulties  Cognition Arousal/Alertness: Awake/alert Behavior During Therapy: WFL for tasks assessed/performed Overall Cognitive Status: Within Functional Limits for tasks assessed                                        General Comments General comments (skin integrity, edema, etc.): Pt's wife present during session.     Exercises Total Joint Exercises Ankle Circles/Pumps: AROM;Both;20 reps Quad Sets: AROM;Right;10 reps Short Arc Quad: AROM;Right;10 reps Heel Slides: AROM;Right;10 reps   Assessment/Plan    PT Assessment Patient needs continued PT services  PT Problem List Decreased strength;Decreased range of motion;Decreased balance;Decreased mobility;Decreased activity tolerance;Decreased knowledge of use of DME;Decreased knowledge of precautions;Pain       PT Treatment Interventions DME instruction;Gait training;Stair training;Functional mobility training;Therapeutic activities;Therapeutic exercise;Neuromuscular re-education;Balance training;Cognitive remediation;Wheelchair mobility training    PT Goals (Current goals can be found in the Care Plan section)  Acute Rehab PT Goals Patient Stated Goal: to go home  PT Goal Formulation: With patient Time For Goal Achievement: 07/29/17 Potential to Achieve Goals: Good    Frequency 7X/week   Barriers to discharge        Co-evaluation               AM-PAC PT "6 Clicks" Daily Activity  Outcome Measure Difficulty turning over in bed (including adjusting bedclothes, sheets and blankets)?: A Lot Difficulty moving from lying on back to sitting on the side of the bed? : Unable Difficulty sitting down on and standing up  from a chair with arms (e.g., wheelchair, bedside commode, etc,.)?: Unable Help needed moving to and from a bed to chair (including a wheelchair)?: A Little Help needed walking in hospital room?: A Little Help needed climbing 3-5 steps with a railing? : A Lot 6 Click Score: 12    End of Session Equipment Utilized During Treatment: Gait belt Activity Tolerance: Treatment limited secondary to medical complications (Comment)(dizziness/nausea ) Patient left: in bed;with call bell/phone within reach;with family/visitor present;with nursing/sitter in room Nurse Communication: Mobility status;Other (comment)(dizziness/nausea ) PT Visit Diagnosis: Other abnormalities of gait and mobility (R26.89);Pain Pain - Right/Left: Right Pain - part of body: Hip    Time: 5465-0354 PT Time Calculation (min) (ACUTE ONLY): 32 min   Charges:   PT Evaluation $PT Eval Low Complexity: 1 Low PT Treatments $Gait Training: 8-22 mins   PT G Codes:        Leighton Ruff, PT, DPT  Acute Rehabilitation Services  Pager: [phone]   Rudean Hitt 07/22/2017, 5:10 PM

## 2017-07-22 NOTE — Anesthesia Preprocedure Evaluation (Signed)
Anesthesia Evaluation  Patient identified by MRN, date of birth, ID band Patient awake    Reviewed: Allergy & Precautions, NPO status , Patient's Chart, lab work & pertinent test results  Airway Mallampati: II  TM Distance: >3 FB Neck ROM: Full    Dental no notable dental hx.    Pulmonary neg pulmonary ROS, sleep apnea ,    Pulmonary exam normal breath sounds clear to auscultation       Cardiovascular hypertension, Pt. on medications negative cardio ROS Normal cardiovascular exam Rhythm:Regular Rate:Normal     Neuro/Psych CVA negative neurological ROS  negative psych ROS   GI/Hepatic negative GI ROS, Neg liver ROS,   Endo/Other  negative endocrine ROSdiabetes, Type 2, Oral Hypoglycemic Agents  Renal/GU Dialysis and ESRFRenal diseasenegative Renal ROS  negative genitourinary   Musculoskeletal negative musculoskeletal ROS (+) Arthritis , Osteoarthritis,    Abdominal   Peds negative pediatric ROS (+)  Hematology negative hematology ROS (+)   Anesthesia Other Findings S/P Heart Transplant  Reproductive/Obstetrics negative OB ROS                             Anesthesia Physical Anesthesia Plan  ASA: IV  Anesthesia Plan:    Post-op Pain Management:    Induction: Intravenous  PONV Risk Score and Plan: 1 and Ondansetron and Treatment may vary due to age or medical condition  Airway Management Planned: Simple Face Mask  Additional Equipment:   Intra-op Plan:   Post-operative Plan:   Informed Consent: I have reviewed the patients History and Physical, chart, labs and discussed the procedure including the risks, benefits and alternatives for the proposed anesthesia with the patient or authorized representative who has indicated his/her understanding and acceptance.   Dental advisory given  Plan Discussed with: CRNA  Anesthesia Plan Comments:         Anesthesia Quick  Evaluation

## 2017-07-22 NOTE — Op Note (Signed)
OPERATIVE REPORT  SURGEON: Rod Can, MD   ASSISTANT: Ky Barban, RNFA.  PREOPERATIVE DIAGNOSIS: Right hip arthritis.   POSTOPERATIVE DIAGNOSIS: Right hip arthritis.   PROCEDURE: Right total hip arthroplasty, anterior approach.   IMPLANTS: DePuy Tri Lock stem, size 11, hi offset. DePuy Pinnacle Cup, size 64 mm. DePuy Altrx liner, size 36 by 64 mm, +4 neutral. DePuy Biolox ceramic head ball, size 36 + 1.5 mm.  ANESTHESIA:  Spinal  ESTIMATED BLOOD LOSS:-1600 mL    BLOOD PRODUCTS: 2 units packed red blood cells.  ANTIBIOTICS: 900 mg Clindamycin.  DRAINS: None.  COMPLICATIONS: None.   CONDITION: PACU - hemodynamically stable.   BRIEF CLINICAL NOTE: [name] is a 56 y.o. male with a long-standing history of Right hip arthritis. After failing conservative management, the patient was indicated for total hip arthroplasty. The risks, benefits, and alternatives to the procedure were explained, and the patient elected to proceed.  PROCEDURE IN DETAIL: Surgical site was marked by myself in the pre-op holding area. Once inside the operating room, spinal anesthesia was obtained, and a foley catheter was inserted. The patient was then positioned on the Hana table. All bony prominences were well padded. The hip was prepped and draped in the normal sterile surgical fashion. A time-out was called verifying side and site of surgery. The patient received IV antibiotics within 60 minutes of beginning the procedure.  The direct anterior approach to the hip was performed through the Hueter interval. Lateral femoral circumflex vessels were treated with the Auqumantys. The anterior capsule was exposed and an inverted T capsulotomy was made.The femoral neck cut was made to the level of the templated cut. A corkscrew was placed into the head and the head was removed. The femoral head was found to have eburnated bone. The head was passed to the back table and was  measured.  Acetabular exposure was achieved, and the pulvinar and labrum were excised. Sequential reaming of the acetabulum was then performed up to a size 63 mm reamer. A 64 mm cup was then opened and impacted into place at approximately 40 degrees of abduction and 20 degrees of anteversion. The final polyethylene liner was impacted into place and acetabular osteophytes were removed.   I then gained femoral exposure taking care to protect the abductors and greater trochanter. This was performed using standard external rotation, extension, and adduction. The capsule was peeled off the inner aspect of the greater trochanter, taking care to preserve the short external rotators. A cookie cutter was used to enter the femoral canal, and then the femoral canal finder was placed. Sequential broaching was performed up to a size 11. Calcar planer was used on the femoral neck remnant. I placed a hi offset neck and a trial head ball. The hip was reduced. Leg lengths and offset were checked fluoroscopically. The hip was dislocated and trial components were removed. The final implants were placed, and the hip was reduced.  Fluoroscopy was used to confirm component position and leg lengths. At 90 degrees of external rotation and full extension, the hip was stable to an anterior directed force.  The wound was copiously irrigated with normal saline using pulse lavage. Marcaine solution was injected into the periarticular soft tissue. The wound was closed in layers using #1 Vicryl and V-Loc for the fascia, 2-0 Vicryl for the subcutaneous fat, 2-0 Monocryl for the deep dermal layer, 3-0 running Monocryl subcuticular stitch, and Dermabond for the skin. Once the glue was fully dried, an Aquacell Ag dressing  was applied. The patient was transported to the recovery room in stable condition. Sponge, needle, and instrument counts were correct at the end of the case x2. The patient tolerated the procedure well and  there were no known complications.  Postoperatively, patient will be admitted to the hospital.  Nephrology consult will be called for dialysis.  He may weight-bear as tolerated with a walker.  We will begin aspirin 81 mg p.o. twice daily for DVT prophylaxis.  He will work with physical therapy.  We will plan to discharge him home when he is medically ready.

## 2017-07-23 ENCOUNTER — Encounter (HOSPITAL_COMMUNITY): Payer: Self-pay | Admitting: Orthopedic Surgery

## 2017-07-23 LAB — BASIC METABOLIC PANEL
ANION GAP: 14 (ref 5–15)
BUN: 61 mg/dL — ABNORMAL HIGH (ref 6–20)
CO2: 23 mmol/L (ref 22–32)
CREATININE: 12.85 mg/dL — AB (ref 0.61–1.24)
Calcium: 8.7 mg/dL — ABNORMAL LOW (ref 8.9–10.3)
Chloride: 98 mmol/L — ABNORMAL LOW (ref 101–111)
GFR, EST AFRICAN AMERICAN: 4 mL/min — AB (ref >60)
GFR, EST NON AFRICAN AMERICAN: 4 mL/min — AB (ref >60)
Glucose, Bld: 93 mg/dL (ref 65–99)
Potassium: 5.3 mmol/L — ABNORMAL HIGH (ref 3.5–5.1)
SODIUM: 135 mmol/L (ref 135–145)

## 2017-07-23 LAB — CBC
HEMATOCRIT: 28.6 % — AB (ref 39.0–52.0)
Hemoglobin: 9.2 g/dL — ABNORMAL LOW (ref 13.0–17.0)
MCH: 28.9 pg (ref 26.0–34.0)
MCHC: 32.2 g/dL (ref 30.0–36.0)
MCV: 89.9 fL (ref 78.0–100.0)
PLATELETS: 171 10*3/uL (ref 150–400)
RBC: 3.18 MIL/uL — ABNORMAL LOW (ref 4.22–5.81)
RDW: 15.7 % — AB (ref 11.5–15.5)
WBC: 11.8 10*3/uL — ABNORMAL HIGH (ref 4.0–10.5)

## 2017-07-23 MED ORDER — DOCUSATE SODIUM 100 MG PO CAPS: 100.0000 mg | ORAL_CAPSULE | Freq: Two times a day (BID) | ORAL | 1 refills | Status: DC

## 2017-07-23 MED ORDER — ASPIRIN 81 MG PO CHEW: 81.0000 mg | CHEWABLE_TABLET | Freq: Two times a day (BID) | ORAL | 1 refills | Status: DC

## 2017-07-23 MED ORDER — OXYCODONE HCL 5 MG PO TABS: 5.0000 mg | ORAL_TABLET | Freq: Four times a day (QID) | ORAL | 0 refills | Status: DC | PRN

## 2017-07-23 MED ORDER — ONDANSETRON HCL 4 MG PO TABS: 4.0000 mg | ORAL_TABLET | Freq: Four times a day (QID) | ORAL | 0 refills | Status: DC | PRN

## 2017-07-23 MED ORDER — SENNA 8.6 MG PO TABS: 2.0000 | ORAL_TABLET | Freq: Every day | ORAL | 0 refills | Status: DC

## 2017-07-23 NOTE — Care Management Note (Signed)
Case Management Note  Patient Details  Name: [name] MRN: [id number] Date of Birth: 21-Feb-1961  Subjective/Objective:    56 yr old gentleman s/p right total hip arthroplasty.                Action/Plan: Case manager spoke with patient concerning discharge plan and DME. Patient was preoperatively setup with [facility], no changes. He will have family support at discharge.    Expected Discharge Date:   07/24/17               Expected Discharge Plan:  [place]  In-House Referral:     Discharge planning Services  CM Consult  Post Acute Care Choice:  Durable Medical Equipment, Home Health Choice offered to:  Patient  DME Arranged:  3-N-1 DME Agency:  [employer]  HH Arranged:  PT HH Agency:  [facility] (formerly [employer])  Status of Service:  Completed, signed off  If discussed at [employer], dates discussed:    Additional Comments:  Ninfa Meeker, RN 07/23/2017, 2:46 PM

## 2017-07-23 NOTE — Progress Notes (Signed)
Pt ambulated this AM from his room into the hallway about 32ft total

## 2017-07-23 NOTE — Discharge Summary (Signed)
Physician Discharge Summary  Patient ID: [name] MRN: [id number] DOB/AGE: 1961/01/26 56 y.o.  Admit date: 07/22/2017 Discharge date: 07/24/2017  Admission Diagnoses:  Osteoarthritis of right hip  Discharge Diagnoses:  Principal Problem:   Osteoarthritis of right hip   Past Medical History:  Diagnosis Date  . Arthritis   . Atrial fibrillation ([place])   . CHF (congestive heart failure), NYHA class III (HCC)    s/p heart transplant  . Chronic kidney disease    end stage  . Chronic systolic dysfunction of left ventricle   . CVA (cerebral infarction)   . Diabetes mellitus    PMH; Prior to heart transplant  . Family history of coronary artery disease    in both parents  . Hyperlipidemia   . Hypertension   . Left bundle branch block   . Morbid obesity ([place])    status post lap band  . Myocardial infarction [facility])    prior to heart transplant  . Nonischemic cardiomyopathy ([place])    prior to heart transplant  . Obesity (BMI 30-39.9)   . Obstructive sleep apnea    no longer needs CPAP after heart transplant per pt  . Premature ventricular contractions   . SOB (shortness of breath)   . Stroke [facility])     Surgeries: Procedure(s): RIGHT TOTAL HIP ARTHROPLASTY ANTERIOR APPROACH on 07/22/2017   Consultants (if any): Treatment Team:  Roney Jaffe, MD  Discharged Condition: Improved  Hospital Course: [name] is an 56 y.o. male who was admitted 07/22/2017 with a diagnosis of Osteoarthritis of right hip and went to the operating room on 07/22/2017 and underwent the above named procedures.    He was given perioperative antibiotics:  Anti-infectives (From admission, onward)   Start     Dose/Rate Route Frequency Ordered Stop   07/22/17 1400  clindamycin (CLEOCIN) IVPB 600 mg     600 mg 100 mL/hr over 30 Minutes Intravenous Every 6 hours 07/22/17 1340 07/22/17 2036   07/22/17 0700  clindamycin (CLEOCIN) IVPB 900 mg     900 mg 100 mL/hr over 30 Minutes Intravenous To  [facility] 07/19/17 0927 07/22/17 0830    .  He was given sequential compression devices, early ambulation, and ASA for DVT prophylaxis.  He was seen by the nephrology service and HD was provided. He received 1 unit PRBCs with HD for hgb of 7.5.  He benefited maximally from the hospital stay and there were no complications.    Recent vital signs:  Vitals:   07/24/17 1115 07/24/17 1140  BP: (!) 83/56 102/62  Pulse: 82 85  Resp: 18 18  Temp:  97.9 F (36.6 C)  SpO2:  96%    Recent laboratory studies:  Lab Results  Component Value Date   HGB 7.5 (L) 07/24/2017   HGB 7.7 (L) 07/24/2017   HGB 9.2 (L) 07/23/2017   Lab Results  Component Value Date   WBC 11.3 (H) 07/24/2017   PLT 146 (L) 07/24/2017   Lab Results  Component Value Date   INR 2.55 (H) 05/15/2012   Lab Results  Component Value Date   NA 133 (L) 07/24/2017   K 5.8 (H) 07/24/2017   CL 97 (L) 07/24/2017   CO2 22 07/24/2017   BUN 76 (H) 07/24/2017   CREATININE 14.99 (H) 07/24/2017   GLUCOSE 82 07/24/2017    Discharge Medications:   Allergies as of 07/24/2017      Reactions   Heparin Nausea Only, Swelling, Other (See Comments)    * *  HIT * * SWELLING REACTION UNSPECIFIED  DIAPHORESIS   Penicillin G Potassium [penicillin G] Nausea Only, Other (See Comments)   DIAPHORESIS      Medication List    STOP taking these medications   aspirin 81 MG tablet Replaced by:  aspirin 81 MG chewable tablet     TAKE these medications   aspirin 81 MG chewable tablet Chew 1 tablet (81 mg total) by mouth 2 (two) times daily. Replaces:  aspirin 81 MG tablet   citalopram 20 MG tablet Commonly known as:  CELEXA TAKE 1 TABLET(20 MG) BY MOUTH EVERY DAY   docusate sodium 100 MG capsule Commonly known as:  COLACE Take 1 capsule (100 mg total) by mouth 2 (two) times daily.   lidocaine 5 % ointment Commonly known as:  XYLOCAINE Apply 1 application topically as needed. Apply to feet twice daily as needed.    mycophenolate 360 MG Tbec EC tablet Commonly known as:  MYFORTIC TAKE 720 mg BY MOUTH TWICE DAILY   NEORAL 100 MG capsule Generic drug:  cycloSPORINE modified TK ONE C PO  BID   omeprazole 20 MG capsule Commonly known as:  PRILOSEC TAKE 20 mg BY MOUTH DAILY   ondansetron 4 MG tablet Commonly known as:  ZOFRAN Take 1 tablet (4 mg total) by mouth every 6 (six) hours as needed for nausea.   oxyCODONE 5 MG immediate release tablet Commonly known as:  Oxy IR/ROXICODONE Take 1-2 tablets (5-10 mg total) by mouth every 6 (six) hours as needed for breakthrough pain.   pravastatin 20 MG tablet Commonly known as:  PRAVACHOL Take 20 mg by mouth daily.   RENO CAPS 1 MG Caps TAKE 1 CAPSULE BY MOUTH ONCE DAILY.   senna 8.6 MG Tabs tablet Commonly known as:  SENOKOT Take 2 tablets (17.2 mg total) by mouth at bedtime.   sevelamer carbonate 800 MG tablet Commonly known as:  RENVELA Take 1,600-3,200 mg See admin instructions by mouth. Take 3200 mg with meals  Take 1600 mg with snacks   temazepam 15 MG capsule Commonly known as:  RESTORIL Take 15 mg at bedtime by mouth.   venlafaxine 37.5 MG tablet Commonly known as:  EFFEXOR Take half tablet x 1 month, then increase to 1 tablet daily.  On days of dialysis, take afterwards.            Durable Medical Equipment  (From admission, onward)        Start     Ordered   07/22/17 1340  DME Walker rolling  Once    Question:  Patient needs a walker to treat with the following condition  Answer:  Status post total replacement of right hip   07/22/17 1340      Diagnostic Studies: Dg Pelvis Portable  Result Date: 07/22/2017 CLINICAL DATA:  Right hip replacement EXAM: PORTABLE PELVIS 1-2 VIEWS COMPARISON:  None. FINDINGS: Changes of right hip replacement. Normal AP alignment. No visible hardware or bony complicating feature. IMPRESSION: Right hip replacement.  No visible complicating feature. Electronically Signed   By: Rolm Baptise  M.D.   On: 07/22/2017 12:19   Dg C-arm 61-120 Min  Result Date: 07/22/2017 CLINICAL DATA:  56 year old male undergoing anterior approach right total hip arthroplasty. EXAM: DG C-ARM 61-120 MIN; OPERATIVE RIGHT HIP WITH PELVIS COMPARISON:  07/26/2016 right hip series FINDINGS: 2 intraoperative AP fluoroscopic spot views of the hip and lower pelvis marked Right. Bipolar type hip arthroplasty hardware in place and appears normally aligned in the AP  projection. No adverse features identified. FLUOROSCOPY TIME:  0 minutes 58 seconds IMPRESSION: Intraoperative images of a right hip arthroplasty with no adverse features identified. Electronically Signed   By: Genevie Ann M.D.   On: 07/22/2017 10:44   Dg Hip Operative Unilat W Or W/o Pelvis Right  Result Date: 07/22/2017 CLINICAL DATA:  56 year old male undergoing anterior approach right total hip arthroplasty. EXAM: DG C-ARM 61-120 MIN; OPERATIVE RIGHT HIP WITH PELVIS COMPARISON:  07/26/2016 right hip series FINDINGS: 2 intraoperative AP fluoroscopic spot views of the hip and lower pelvis marked Right. Bipolar type hip arthroplasty hardware in place and appears normally aligned in the AP projection. No adverse features identified. FLUOROSCOPY TIME:  0 minutes 58 seconds IMPRESSION: Intraoperative images of a right hip arthroplasty with no adverse features identified. Electronically Signed   By: Genevie Ann M.D.   On: 07/22/2017 10:44    Disposition: 01-Home or Self Care  Discharge Instructions    Call MD / Call 911   Complete by:  As directed    If you experience chest pain or shortness of breath, CALL 911 and be transported to the hospital emergency room.  If you develope a fever above 101 F, pus (white drainage) or increased drainage or redness at the wound, or calf pain, call your surgeon's office.   Constipation Prevention   Complete by:  As directed    Drink plenty of fluids.  Prune juice may be helpful.  You may use a stool softener, such as Colace (over  the counter) 100 mg twice a day.  Use MiraLax (over the counter) for constipation as needed.   Diet - low sodium heart healthy   Complete by:  As directed    Driving restrictions   Complete by:  As directed    No driving for 6 weeks   Increase activity slowly as tolerated   Complete by:  As directed    Lifting restrictions   Complete by:  As directed    No lifting for 6 weeks   TED hose   Complete by:  As directed    Use stockings (TED hose) for 12 weeks on both leg(s).  You may remove them at night for sleeping.      Follow-up Information    Nika Yazzie, Aaron Edelman, MD. Schedule an appointment as soon as possible for a visit in 2 week(s).   Specialty:  Orthopedic Surgery Why:  For wound re-check Contact information: [address]. [address] [place] [address] [phone]        [facility].   Specialty:  [facility] Why:  A representative from [facility] will contact you to arrange start date and time for your therapy. Contact information: [address] [place] [address] [phone]            Signed: Hilton Cork Deagan Sevin 07/24/2017, 1:35 PM

## 2017-07-23 NOTE — Progress Notes (Signed)
Subjective:  No cos , for hd today   Objective Vital signs in last 24 hours: Vitals:   07/22/17 1948 07/22/17 2300 07/23/17 0458 07/23/17 1300  BP: (!) 109/55 (!) 85/50 (!) 80/48 (!) 108/58  Pulse: 80 83 86 86  Resp: 16 14 15 16   Temp: 97.9 F (36.6 C) 98.4 F (36.9 C) 98.6 F (37 C) (!) 97.5 F (36.4 C)  TempSrc: Oral Oral Oral Oral  SpO2: 100% 100% 96% 100%   Weight change:   Physical Exam: General: Obese AAM, awoken from sleep  NAD ,apprpriate Heart: RRR, no m,r,g Lungs: CTA , Abdomen: Obese, bs pos, soft , NT, ND Extremities = no pedal edema  Dialysis Access: pos bruit R FA AVF   Dialysis: [facility] MWF 4h 50min   142.5kg    2/2 bath  RFA AVF  Hep none -Mircera 100 mcg q 2wks last on 07/10/17  -Hec 10 mcg IV/HD    Problem/Plan: 1. ESRD -  HD  MWF schedule  / k 5.3  HD today off schedule (due to emergent cases yest )  1st shift in am HD to keep on schedule . 2. SP R. T. Hip Arthroplasty- per ortho 3. Hypertension/volume  -BP slightly lowish  no  bp meds  4. Anemia  -hgb 10.1 >9.2 fu hgb trend  Next ESA  This WED  aranesp 100 5. Metabolic bone disease -  Vit d on hd , binder is Renvela 6. SP Hrt Tx  2014- On meds stable  7. Ho Hit - no hep HD  8. DM type 2 -  9. Nutrition - Renal ,carb mod , renal vit   Ernest Haber, PA-C [facility] Beeper [phone] 07/23/2017,2:28 PM  LOS: 1 day   Pt seen, examined and agree w A/P as above.  Kelly Splinter MD [facility] pager [phone]   07/23/2017, 4:31 PM    Labs: Basic Metabolic Panel: Recent Labs  Lab 07/22/17 0648 07/23/17 0714  NA 141 135  K 4.8 5.3*  CL  --  98*  CO2  --  23  GLUCOSE 83 93  BUN  --  61*  CREATININE  --  12.85*  CALCIUM  --  8.7*   CBC: Recent Labs  Lab 07/22/17 0648 07/22/17 1204 07/23/17 0714  WBC  --   --  11.8*  HGB 10.2* 10.1* 9.2*  HCT 30.0* 31.9* 28.6*  MCV  --   --  89.9  PLT  --   --  171   CBG: Recent Labs  Lab 07/22/17 1121   GLUCAP 98     Medications: . sodium chloride Stopped (07/23/17 0000)  . methocarbamol (ROBAXIN)  IV     . aspirin  81 mg Oral BID  . citalopram  20 mg Oral Daily  . cycloSPORINE modified  100 mg Oral BID  . [START ON 07/24/2017] darbepoetin (ARANESP) injection - DIALYSIS  100 mcg Intravenous Q Wed-HD  . docusate sodium  100 mg Oral BID  . [START ON 07/24/2017] doxercalciferol  10 mcg Intravenous Q M,W,F-HD  . multivitamin  1 tablet Oral QHS  . mycophenolate  720 mg Oral BID  . pantoprazole  40 mg Oral Daily  . pravastatin  20 mg Oral q1800  . senna  2 tablet Oral QHS  . sevelamer carbonate  3,200 mg Oral TID WC  . temazepam  15 mg Oral QHS

## 2017-07-23 NOTE — Progress Notes (Signed)
Physical Therapy Treatment Patient Details Name: [name] MRN: [id number] DOB: Jul 26, 1961 Today's Date: 07/23/2017    History of Present Illness Pt is a 56 y/o male s/p elective R THA. PMH includes a fib, CHF, CVA, DM, HTN, MI, nonischemic cardiomyopathy, ESRD on HD MWF.     PT Comments    Pt progressing towards physical therapy goals. Overall mobilizing well with min guard assist to supervision for safety and RW for support. Continues to be limited by dizziness with standing activity. Pt attributes this to hunger and limited PO intake since surgery, however may benefit from orthostatics next session. Noted pt to go for HD today. Will attempt afternoon session as time allows with HD schedule. Otherwise, will follow up tomorrow morning. Pt will need to complete stair training prior to d/c.  Follow Up Recommendations  DC plan and follow up therapy as arranged by surgeon;Supervision for mobility/OOB     Equipment Recommendations  3in1 (PT)(bariatric)    Recommendations for Other Services       Precautions / Restrictions Precautions Precautions: None Precaution Comments: Educated pt on positioning recommendations Restrictions Weight Bearing Restrictions: Yes RLE Weight Bearing: Weight bearing as tolerated    Mobility  Bed Mobility Overal bed mobility: Needs Assistance Bed Mobility: Supine to Sit     Supine to sit: Supervision     General bed mobility comments: HOB lowered to ~10. Pt was able to transition to EOB without assistance. Increased time and effort required to advance RLE to EOB.   Transfers Overall transfer level: Needs assistance Equipment used: Rolling walker (2 wheeled) Transfers: Sit to/from Stand Sit to Stand: Supervision         General transfer comment: Supervision for safety. Pt was able to power-up to full standing position without assistance. Pt demonstrated proper hand placement on seated surface for safety.    Ambulation/Gait Ambulation/Gait assistance: Min guard Ambulation Distance (Feet): 50 Feet Assistive device: Rolling walker (2 wheeled) Gait Pattern/deviations: Antalgic;Trunk flexed;Step-through pattern;Decreased step length - right;Decreased step length - left;Decreased weight shift to right Gait velocity: Decreased Gait velocity interpretation: Below normal speed for age/gender General Gait Details: Slow, antalgic gait. Pt with very flexed trunk throughout gait training and made minimal corrective changes to posture with cueing. Pt reports onset of dizziness after gait training initiated - stood statically for ~30 seconds with no improvement; attempted to continue with gait training however dizziness worsened and pt returned to room without difficulty. Pt reported immediate improvement in symptoms upon return to chair and attributes this to hunger.    Stairs            Wheelchair Mobility    Modified Rankin (Stroke Patients Only)       Balance Overall balance assessment: Needs assistance Sitting-balance support: No upper extremity supported;Feet supported Sitting balance-Leahy Scale: Good     Standing balance support: Bilateral upper extremity supported;During functional activity Standing balance-Leahy Scale: Poor Standing balance comment: Reliant on UE support                             Cognition Arousal/Alertness: Awake/alert Behavior During Therapy: WFL for tasks assessed/performed Overall Cognitive Status: Within Functional Limits for tasks assessed                                        Exercises Total Joint Exercises Ankle Circles/Pumps: 10 reps  Quad Sets: 10 reps Gluteal Sets: 10 reps Heel Slides: 10 reps Hip ABduction/ADduction: 10 reps    General Comments        Pertinent Vitals/Pain Pain Assessment: Faces Faces Pain Scale: Hurts a little bit Pain Location: R hip  Pain Descriptors / Indicators: Operative site  guarding;Sore Pain Intervention(s): Limited activity within patient's tolerance;Monitored during session;Repositioned    Home Living                      Prior Function            PT Goals (current goals can now be found in the care plan section) Acute Rehab PT Goals Patient Stated Goal: to go home  PT Goal Formulation: With patient Time For Goal Achievement: 07/29/17 Potential to Achieve Goals: Good Progress towards PT goals: Progressing toward goals    Frequency    7X/week      PT Plan Current plan remains appropriate    Co-evaluation              AM-PAC PT "6 Clicks" Daily Activity  Outcome Measure  Difficulty turning over in bed (including adjusting bedclothes, sheets and blankets)?: A Lot Difficulty moving from lying on back to sitting on the side of the bed? : Unable Difficulty sitting down on and standing up from a chair with arms (e.g., wheelchair, bedside commode, etc,.)?: Unable Help needed moving to and from a bed to chair (including a wheelchair)?: A Little Help needed walking in hospital room?: A Little Help needed climbing 3-5 steps with a railing? : A Lot 6 Click Score: 12    End of Session Equipment Utilized During Treatment: Gait belt Activity Tolerance: Treatment limited secondary to medical complications (Comment)(dizziness with ambulation) Patient left: in bed;with call bell/phone within reach;with family/visitor present;with nursing/sitter in room Nurse Communication: Mobility status PT Visit Diagnosis: Other abnormalities of gait and mobility (R26.89);Pain Pain - Right/Left: Right Pain - part of body: Hip     Time: 8182-9937 PT Time Calculation (min) (ACUTE ONLY): 16 min  Charges:  $Gait Training: 8-22 mins                    G Codes:       Rolinda Roan, PT, DPT Acute Rehabilitation Services Pager: [phone]    Thelma Comp 07/23/2017, 8:13 AM

## 2017-07-23 NOTE — Progress Notes (Signed)
PT Cancellation Note  Patient Details Name: [name] MRN: [id number] DOB: August 20, 1961   Cancelled Treatment:    Reason Eval/Treat Not Completed: Pain limiting ability to participate;Fatigue/lethargy limiting ability to participate. Pt reports he just got back in bed when PT arrived. States he "doesn't feel well" and needs "to get this fluid off in dialysis". Pt declined any mobility with PT at this time. Will continue to follow and progress as able per POC.     Thelma Comp 07/23/2017, 2:57 PM   Rolinda Roan, PT, DPT Acute Rehabilitation Services Pager: [phone]

## 2017-07-23 NOTE — Progress Notes (Signed)
   Subjective:  Patient reports pain as mild to moderate.  Denies N/V/CP/SOB.  Objective:   VITALS:   Vitals:   07/22/17 1500 07/22/17 1948 07/22/17 2300 07/23/17 0458  BP: 117/66 (!) 109/55 (!) 85/50 (!) 80/48  Pulse: 85 80 83 86  Resp: 14 16 14 15   Temp: 98 F (36.7 C) 97.9 F (36.6 C) 98.4 F (36.9 C) 98.6 F (37 C)  TempSrc: Oral Oral Oral Oral  SpO2: 97% 100% 100% 96%    NAD ABD soft Sensation intact distally Intact pulses distally Dorsiflexion/Plantar flexion intact Incision: dressing C/D/I Compartment soft   Lab Results  Component Value Date   WBC 9.2 07/11/2017   HGB 10.1 (L) 07/22/2017   HCT 31.9 (L) 07/22/2017   MCV 91.9 07/11/2017   PLT 199 07/11/2017   BMET    Component Value Date/Time   NA 141 07/22/2017 0648   K 4.8 07/22/2017 0648   CL 96 (L) 07/11/2017 0908   CO2 27 07/11/2017 0908   GLUCOSE 83 07/22/2017 0648   BUN 33 (H) 07/11/2017 0908   CREATININE 8.65 (H) 07/11/2017 0908   CALCIUM 9.5 07/11/2017 0908   GFRNONAA 6 (L) 07/11/2017 0908   GFRAA 7 (L) 07/11/2017 0908     Assessment/Plan: 1 Day Post-Op   Principal Problem:   Osteoarthritis of right hip   WBAT with walker DVT ppx: ASA, SCDs, TEDs ABLA on anemia of chronic disease: received PRBCs intraop, am labs pending S/p heart transplant: cont meds ESRD on HD: plan for HD today PT/OT PO pain control Dispo: D/C home likely tomorrow   [name] 07/23/2017, 7:12 AM   Rod Can, MD Cell [phone]

## 2017-07-24 LAB — CBC
HCT: 22.7 % — ABNORMAL LOW (ref 39.0–52.0)
HEMATOCRIT: 22.7 % — AB (ref 39.0–52.0)
HEMOGLOBIN: 7.7 g/dL — AB (ref 13.0–17.0)
Hemoglobin: 7.5 g/dL — ABNORMAL LOW (ref 13.0–17.0)
MCH: 29.8 pg (ref 26.0–34.0)
MCH: 30 pg (ref 26.0–34.0)
MCHC: 33 g/dL (ref 30.0–36.0)
MCHC: 33.9 g/dL (ref 30.0–36.0)
MCV: 90.1 fL (ref 78.0–100.0)
MCV: 88.3 fL (ref 78.0–100.0)
PLATELETS: 150 10*3/uL (ref 150–400)
Platelets: 146 10*3/uL — ABNORMAL LOW (ref 150–400)
RBC: 2.52 MIL/uL — AB (ref 4.22–5.81)
RBC: 2.57 MIL/uL — AB (ref 4.22–5.81)
RDW: 15.4 % (ref 11.5–15.5)
RDW: 15.8 % — ABNORMAL HIGH (ref 11.5–15.5)
WBC: 11.3 10*3/uL — AB (ref 4.0–10.5)
WBC: 11.4 10*3/uL — AB (ref 4.0–10.5)

## 2017-07-24 LAB — BASIC METABOLIC PANEL
ANION GAP: 14 (ref 5–15)
BUN: 76 mg/dL — ABNORMAL HIGH (ref 6–20)
CHLORIDE: 97 mmol/L — AB (ref 101–111)
CO2: 22 mmol/L (ref 22–32)
Calcium: 8.4 mg/dL — ABNORMAL LOW (ref 8.9–10.3)
Creatinine, Ser: 14.99 mg/dL — ABNORMAL HIGH (ref 0.61–1.24)
GFR, EST AFRICAN AMERICAN: 4 mL/min — AB (ref >60)
GFR, EST NON AFRICAN AMERICAN: 3 mL/min — AB (ref >60)
Glucose, Bld: 82 mg/dL (ref 65–99)
POTASSIUM: 5.8 mmol/L — AB (ref 3.5–5.1)
Sodium: 133 mmol/L — ABNORMAL LOW (ref 135–145)

## 2017-07-24 LAB — HEPATITIS B SURFACE ANTIGEN: HEP B S AG: NEGATIVE

## 2017-07-24 LAB — PREPARE RBC (CROSSMATCH)

## 2017-07-24 MED ORDER — PRAVASTATIN SODIUM 40 MG PO TABS: 40.0000 mg | ORAL_TABLET | Freq: Every day | ORAL | 5 refills | Status: DC

## 2017-07-24 MED ORDER — CYCLOSPORINE MODIFIED (NEORAL) 25 MG PO CAPS
75.0000 mg | ORAL_CAPSULE | Freq: Two times a day (BID) | ORAL | Status: DC
  Filled 2017-07-24 (×2): qty 3

## 2017-07-24 MED ORDER — PRAVASTATIN SODIUM 40 MG PO TABS
40.0000 mg | ORAL_TABLET | Freq: Every day | ORAL | Status: DC
  Filled 2017-07-24: qty 1

## 2017-07-24 MED ORDER — CYCLOSPORINE MODIFIED (NEORAL) 25 MG PO CAPS
100.0000 mg | ORAL_CAPSULE | Freq: Two times a day (BID) | ORAL | Status: DC
  Filled 2017-07-24 (×2): qty 4

## 2017-07-24 MED ORDER — DOXERCALCIFEROL 4 MCG/2ML IV SOLN
INTRAVENOUS | Status: AC
  Filled 2017-07-24: qty 6

## 2017-07-24 MED ORDER — SODIUM CHLORIDE 0.9 % IV SOLN: Freq: Once | INTRAVENOUS | Status: DC

## 2017-07-24 MED ORDER — CYCLOSPORINE MODIFIED (NEORAL) 25 MG PO CAPS: 175.0000 mg | ORAL_CAPSULE | Freq: Two times a day (BID) | ORAL | Status: DC

## 2017-07-24 MED ORDER — DARBEPOETIN ALFA 100 MCG/0.5ML IJ SOSY
PREFILLED_SYRINGE | INTRAMUSCULAR | Status: AC
  Filled 2017-07-24: qty 0.5

## 2017-07-24 MED ORDER — CYCLOSPORINE MODIFIED (NEORAL) 25 MG PO CAPS: 75.0000 mg | ORAL_CAPSULE | Freq: Two times a day (BID) | ORAL | 0 refills | Status: DC

## 2017-07-24 NOTE — Progress Notes (Signed)
Physical Therapy Treatment Patient Details Name: [name] MRN: [id number] DOB: [birth year] Today's Date: 07/24/2017    History of Present Illness Pt is a 56 y/o male s/p elective R THA. PMH includes a fib, CHF, CVA, DM, HTN, MI, nonischemic cardiomyopathy, ESRD on HD MWF.     PT Comments    Pt progressing towards physical therapy goals. Was limited this session by mild dizziness and vomiting episode in hallway. After episode pt was motivated to complete stair training. Overall, pt mobilizing fairly well considering increased level of fatigue from HD sessions. He is safe to return home this afternoon if medically cleared. Pt is anticipating d/c home today, however if he is still here tomorrow, acute PT will continue to follow for at least 1 more session.    Follow Up Recommendations  DC plan and follow up therapy as arranged by surgeon;Supervision for mobility/OOB     Equipment Recommendations  3in1 (PT)(bariatric)    Recommendations for Other Services       Precautions / Restrictions Precautions Precautions: None Precaution Comments: Educated pt on positioning recommendations Restrictions Weight Bearing Restrictions: Yes RLE Weight Bearing: Weight bearing as tolerated    Mobility  Bed Mobility               General bed mobility comments: Pt was sitting EOB when PT arrived.   Transfers Overall transfer level: Needs assistance Equipment used: Rolling walker (2 wheeled) Transfers: Sit to/from Stand Sit to Stand: Supervision         General transfer comment: Supervision for safety. Pt was able to power-up to full standing position without assistance. VC's for hand placement on seated surface for safety.   Ambulation/Gait Ambulation/Gait assistance: Min guard Ambulation Distance (Feet): 200 Feet Assistive device: Rolling walker (2 wheeled) Gait Pattern/deviations: Antalgic;Trunk flexed;Step-through pattern;Decreased step length - right;Decreased step length -  left;Decreased weight shift to right Gait velocity: Decreased Gait velocity interpretation: Below normal speed for age/gender General Gait Details: Slow, antalgic gait. Pt with very flexed trunk throughout gait training and made minimal corrective changes to posture with cueing. Pt reports onset of dizziness but was able to continue. Had to sit as pt felt sick and vomited in hall. Was eager to complete stair training for d/c after vomiting episode   Stairs Stairs: Yes   Stair Management: One rail Right;Step to pattern;Sideways Number of Stairs: 5 General stair comments: Pt was instructed in sideways and forward negotiation. Stairwell had railings too far apart to simulate home environment. He was able to complete 5 stairs without assistance. VC's for sequencing and general safety.   Wheelchair Mobility    Modified Rankin (Stroke Patients Only)       Balance Overall balance assessment: Needs assistance Sitting-balance support: No upper extremity supported;Feet supported Sitting balance-Leahy Scale: Good     Standing balance support: Bilateral upper extremity supported;During functional activity Standing balance-Leahy Scale: Poor Standing balance comment: Reliant on UE support                             Cognition Arousal/Alertness: Awake/alert Behavior During Therapy: WFL for tasks assessed/performed Overall Cognitive Status: Within Functional Limits for tasks assessed                                        Exercises      General Comments  Pertinent Vitals/Pain Pain Assessment: 0-10 Pain Score: 3  Pain Location: R hip  Pain Descriptors / Indicators: Operative site guarding;Sore Pain Intervention(s): Limited activity within patient's tolerance;Monitored during session;Repositioned    Home Living                      Prior Function            PT Goals (current goals can now be found in the care plan section) Acute Rehab  PT Goals Patient Stated Goal: to go home today PT Goal Formulation: With patient Time For Goal Achievement: 07/29/17 Potential to Achieve Goals: Good Progress towards PT goals: Progressing toward goals    Frequency    7X/week      PT Plan Current plan remains appropriate    Co-evaluation              AM-PAC PT "6 Clicks" Daily Activity  Outcome Measure  Difficulty turning over in bed (including adjusting bedclothes, sheets and blankets)?: A Lot Difficulty moving from lying on back to sitting on the side of the bed? : A Little Difficulty sitting down on and standing up from a chair with arms (e.g., wheelchair, bedside commode, etc,.)?: A Little Help needed moving to and from a bed to chair (including a wheelchair)?: A Little Help needed walking in hospital room?: A Little Help needed climbing 3-5 steps with a railing? : A Little 6 Click Score: 17    End of Session Equipment Utilized During Treatment: Gait belt Activity Tolerance: Treatment limited secondary to medical complications (Comment)(Vomiting) Patient left: in chair;with call bell/phone within reach Nurse Communication: Mobility status PT Visit Diagnosis: Other abnormalities of gait and mobility (R26.89);Pain Pain - Right/Left: Right Pain - part of body: Hip     Time: 1410-1440 PT Time Calculation (min) (ACUTE ONLY): 30 min  Charges:  $Gait Training: 23-37 mins                    G Codes:       Rolinda Roan, PT, DPT Acute Rehabilitation Services Pager: [phone]    Thelma Comp 07/24/2017, 2:57 PM

## 2017-07-24 NOTE — Plan of Care (Signed)
  Adequate for Discharge Education: Knowledge of General Education information will improve 07/24/2017 1509 - Adequate for Discharge by Rance Muir, RN Health Behavior/Discharge Planning: Ability to manage health-related needs will improve 07/24/2017 1509 - Adequate for Discharge by Rance Muir, RN Clinical Measurements: Ability to maintain clinical measurements within normal limits will improve 07/24/2017 1509 - Adequate for Discharge by Rance Muir, RN Will remain free from infection 07/24/2017 1509 - Adequate for Discharge by Rance Muir, RN Diagnostic test results will improve 07/24/2017 1509 - Adequate for Discharge by Rance Muir, RN Respiratory complications will improve 07/24/2017 1509 - Adequate for Discharge by Rance Muir, RN Cardiovascular complication will be avoided 07/24/2017 1509 - Adequate for Discharge by Rance Muir, RN Activity: Risk for activity intolerance will decrease 07/24/2017 1509 - Adequate for Discharge by Rance Muir, RN Nutrition: Adequate nutrition will be maintained 07/24/2017 1509 - Adequate for Discharge by Rance Muir, RN Coping: Level of anxiety will decrease 07/24/2017 1509 - Adequate for Discharge by Rance Muir, RN Elimination: Will not experience complications related to bowel motility 07/24/2017 1509 - Adequate for Discharge by Rance Muir, RN Will not experience complications related to urinary retention 07/24/2017 1509 - Adequate for Discharge by Rance Muir, RN Pain Managment: General experience of comfort will improve 07/24/2017 1509 - Adequate for Discharge by Rance Muir, RN Safety: Ability to remain free from injury will improve 07/24/2017 1509 - Adequate for Discharge by Rance Muir, RN Skin Integrity: Risk for impaired skin integrity will decrease 07/24/2017 1509 - Adequate for Discharge by Rance Muir, RN

## 2017-07-24 NOTE — Progress Notes (Signed)
Just gave report to HD nurse, patient is alert and oriented, will continue to monitor.

## 2017-07-24 NOTE — Progress Notes (Signed)
PT Cancellation Note  Patient Details Name: [name] MRN: [id number] DOB: [birth year]   Cancelled Treatment:    Reason Eval/Treat Not Completed: Patient at procedure or test/unavailable. Pt currently in HD. Will continue to follow and progress as able per POC.    Thelma Comp 07/24/2017, 10:13 AM   Rolinda Roan, PT, DPT Acute Rehabilitation Services Pager: [phone]

## 2017-07-24 NOTE — Progress Notes (Signed)
Report received on patient from [facility].  Patient in dialysis at this time.Marland Kitchen

## 2017-07-24 NOTE — Progress Notes (Signed)
   Subjective:  Patient reports pain as mild to moderate.  Denies N/V/CP/SOB.  Objective:   VITALS:   Vitals:   07/24/17 1043 07/24/17 1100 07/24/17 1115 07/24/17 1140  BP: 93/61 (!) 92/55 (!) 83/56 102/62  Pulse: 84 83 82 85  Resp: 18 18 18 18   Temp: 97.7 F (36.5 C)   97.9 F (36.6 C)  TempSrc: Oral   Oral  SpO2: 98%   96%  Weight:    (!) 142.3 kg (313 lb 11.4 oz)    NAD ABD soft Sensation intact distally Intact pulses distally Dorsiflexion/Plantar flexion intact Incision: dressing C/D/I Compartment soft   Lab Results  Component Value Date   WBC 11.3 (H) 07/24/2017   HGB 7.5 (L) 07/24/2017   HCT 22.7 (L) 07/24/2017   MCV 90.1 07/24/2017   PLT 146 (L) 07/24/2017   BMET    Component Value Date/Time   NA 133 (L) 07/24/2017 0624   K 5.8 (H) 07/24/2017 0624   CL 97 (L) 07/24/2017 0624   CO2 22 07/24/2017 0624   GLUCOSE 82 07/24/2017 0624   BUN 76 (H) 07/24/2017 0624   CREATININE 14.99 (H) 07/24/2017 0624   CALCIUM 8.4 (L) 07/24/2017 0624   GFRNONAA 3 (L) 07/24/2017 0624   GFRAA 4 (L) 07/24/2017 0624     Assessment/Plan: 2 Days Post-Op   Principal Problem:   Osteoarthritis of right hip   WBAT with walker DVT ppx: ASA, SCDs, TEDs ABLA on anemia of chronic disease: received 1 unit PRBCs with HD today S/p heart transplant: cont meds ESRD on HD: had HD today PT/OT PO pain control Dispo: D/C home after clears therapy   [name] 07/24/2017, 1:31 PM   Rod Can, MD Cell [phone]

## 2017-07-24 NOTE — Progress Notes (Signed)
Subjective:  No cos , on HD now, Hb 7 .5, asked to give one unit prbc to pt on HD today by ortho  Objective Vital signs in last 24 hours: Vitals:   07/24/17 1013 07/24/17 1018 07/24/17 1023 07/24/17 1028  BP: (!) 94/52 98/62 97/60  93/60  Pulse: 85 88 84 83  Resp: 16 16 20 20   Temp: 97.7 F (36.5 C) 97.7 F (36.5 C) 97.7 F (36.5 C) 97.7 F (36.5 C)  TempSrc:      SpO2:      Weight:       Weight change:   Physical Exam: General: Obese AAM Heart: RRR, no m,r,g Lungs: CTA , Abdomen: Obese, bs pos, soft , NT, ND Extremities = no pedal edema  Dialysis Access: pos bruit R FA AVF   Dialysis: [facility] MWF 4h 10min   142.5kg    2/2 bath  RFA AVF  Hep none -Mircera 100 mcg q 2wks last on 07/10/17  -Hec 10 mcg IV/HD    Problem/Plan: 1. ESRD -  HD today w prbc x 1 2. SP R. T. Hip Arthroplasty- per ortho 3. Hypertension/volume  - no  bp meds . Up 4kg today, UF same on HD today 4. Anemia  -hgb 10.1 > 7.5 today, to get one unit prbc with HD today. Due for esa today, give aranesp 100 5. Metabolic bone disease -  Vit d on hd , binder is Renvela  6. SP Hrt Tx  2014- On meds stable  7. Ho Hit - no hep HD  8. DM type 2 -  9. Nutrition - Renal ,carb mod , renal vit  10. Dispo - per primary team, stable from renal standpoint   Kelly Splinter MD [place] pager [phone]   07/24/2017, 10:41 AM    Labs: Basic Metabolic Panel: Recent Labs  Lab 07/22/17 0648 07/23/17 0714 07/24/17 0624  NA 141 135 133*  K 4.8 5.3* 5.8*  CL  --  98* 97*  CO2  --  23 22  GLUCOSE 83 93 82  BUN  --  61* 76*  CREATININE  --  12.85* 14.99*  CALCIUM  --  8.7* 8.4*   CBC: Recent Labs  Lab 07/23/17 0714 07/24/17 0615 07/24/17 0624  WBC 11.8* 11.4* 11.3*  HGB 9.2* 7.7* 7.5*  HCT 28.6* 22.7* 22.7*  MCV 89.9 88.3 90.1  PLT 171 150 146*   CBG: Recent Labs  Lab 07/22/17 1121  GLUCAP 98     Medications: . sodium chloride    . methocarbamol (ROBAXIN)  IV     .  aspirin  81 mg Oral BID  . citalopram  20 mg Oral Daily  . cycloSPORINE modified  100 mg Oral BID  . darbepoetin (ARANESP) injection - DIALYSIS  100 mcg Intravenous Q Wed-HD  . docusate sodium  100 mg Oral BID  . doxercalciferol  10 mcg Intravenous Q M,W,F-HD  . multivitamin  1 tablet Oral QHS  . mycophenolate  720 mg Oral BID  . pantoprazole  40 mg Oral Daily  . pravastatin  20 mg Oral q1800  . senna  2 tablet Oral QHS  . sevelamer carbonate  3,200 mg Oral TID WC  . temazepam  15 mg Oral QHS

## 2017-07-25 LAB — TYPE AND SCREEN
ABO/RH(D): A POS
ANTIBODY SCREEN: NEGATIVE
UNIT DIVISION: 0
Unit division: 0

## 2017-07-25 LAB — BPAM RBC
Blood Product Expiration Date: 201812122359
Blood Product Expiration Date: 201812122359
ISSUE DATE / TIME: 201811280956
ISSUE DATE / TIME: 201811260929
UNIT TYPE AND RH: 6200
Unit Type and Rh: 6200

## 2017-07-26 DIAGNOSIS — N186 End stage renal disease: Secondary | ICD-10-CM | POA: Diagnosis not present

## 2017-07-26 DIAGNOSIS — Z992 Dependence on renal dialysis: Secondary | ICD-10-CM | POA: Diagnosis not present

## 2017-07-26 DIAGNOSIS — N2581 Secondary hyperparathyroidism of renal origin: Secondary | ICD-10-CM | POA: Diagnosis not present

## 2017-07-26 DIAGNOSIS — E1129 Type 2 diabetes mellitus with other diabetic kidney complication: Secondary | ICD-10-CM | POA: Diagnosis not present

## 2017-07-26 DIAGNOSIS — I5022 Chronic systolic (congestive) heart failure: Secondary | ICD-10-CM | POA: Diagnosis not present

## 2017-07-26 DIAGNOSIS — T8620 Unspecified complication of heart transplant: Secondary | ICD-10-CM | POA: Diagnosis not present

## 2017-07-26 DIAGNOSIS — E1122 Type 2 diabetes mellitus with diabetic chronic kidney disease: Secondary | ICD-10-CM | POA: Diagnosis not present

## 2017-07-26 DIAGNOSIS — D631 Anemia in chronic kidney disease: Secondary | ICD-10-CM | POA: Diagnosis not present

## 2017-07-26 DIAGNOSIS — D509 Iron deficiency anemia, unspecified: Secondary | ICD-10-CM | POA: Diagnosis not present

## 2017-07-26 DIAGNOSIS — Z471 Aftercare following joint replacement surgery: Secondary | ICD-10-CM | POA: Diagnosis not present

## 2017-07-26 DIAGNOSIS — I132 Hypertensive heart and chronic kidney disease with heart failure and with stage 5 chronic kidney disease, or end stage renal disease: Secondary | ICD-10-CM | POA: Diagnosis not present

## 2017-07-26 DIAGNOSIS — E8770 Fluid overload, unspecified: Secondary | ICD-10-CM | POA: Diagnosis not present

## 2017-07-29 DIAGNOSIS — E1129 Type 2 diabetes mellitus with other diabetic kidney complication: Secondary | ICD-10-CM | POA: Diagnosis not present

## 2017-07-29 DIAGNOSIS — N186 End stage renal disease: Secondary | ICD-10-CM | POA: Diagnosis not present

## 2017-07-29 DIAGNOSIS — D509 Iron deficiency anemia, unspecified: Secondary | ICD-10-CM | POA: Diagnosis not present

## 2017-07-29 DIAGNOSIS — D631 Anemia in chronic kidney disease: Secondary | ICD-10-CM | POA: Diagnosis not present

## 2017-07-29 DIAGNOSIS — N2581 Secondary hyperparathyroidism of renal origin: Secondary | ICD-10-CM | POA: Diagnosis not present

## 2017-07-30 DIAGNOSIS — Z471 Aftercare following joint replacement surgery: Secondary | ICD-10-CM | POA: Diagnosis not present

## 2017-07-30 DIAGNOSIS — I132 Hypertensive heart and chronic kidney disease with heart failure and with stage 5 chronic kidney disease, or end stage renal disease: Secondary | ICD-10-CM | POA: Diagnosis not present

## 2017-07-30 DIAGNOSIS — D631 Anemia in chronic kidney disease: Secondary | ICD-10-CM | POA: Diagnosis not present

## 2017-07-30 DIAGNOSIS — E1122 Type 2 diabetes mellitus with diabetic chronic kidney disease: Secondary | ICD-10-CM | POA: Diagnosis not present

## 2017-07-30 DIAGNOSIS — N186 End stage renal disease: Secondary | ICD-10-CM | POA: Diagnosis not present

## 2017-07-30 DIAGNOSIS — I5022 Chronic systolic (congestive) heart failure: Secondary | ICD-10-CM | POA: Diagnosis not present

## 2017-07-31 DIAGNOSIS — D509 Iron deficiency anemia, unspecified: Secondary | ICD-10-CM | POA: Diagnosis not present

## 2017-07-31 DIAGNOSIS — E1129 Type 2 diabetes mellitus with other diabetic kidney complication: Secondary | ICD-10-CM | POA: Diagnosis not present

## 2017-07-31 DIAGNOSIS — N186 End stage renal disease: Secondary | ICD-10-CM | POA: Diagnosis not present

## 2017-07-31 DIAGNOSIS — D631 Anemia in chronic kidney disease: Secondary | ICD-10-CM | POA: Diagnosis not present

## 2017-07-31 DIAGNOSIS — N2581 Secondary hyperparathyroidism of renal origin: Secondary | ICD-10-CM | POA: Diagnosis not present

## 2017-08-01 DIAGNOSIS — Z471 Aftercare following joint replacement surgery: Secondary | ICD-10-CM | POA: Diagnosis not present

## 2017-08-01 DIAGNOSIS — I132 Hypertensive heart and chronic kidney disease with heart failure and with stage 5 chronic kidney disease, or end stage renal disease: Secondary | ICD-10-CM | POA: Diagnosis not present

## 2017-08-01 DIAGNOSIS — N186 End stage renal disease: Secondary | ICD-10-CM | POA: Diagnosis not present

## 2017-08-01 DIAGNOSIS — E1122 Type 2 diabetes mellitus with diabetic chronic kidney disease: Secondary | ICD-10-CM | POA: Diagnosis not present

## 2017-08-01 DIAGNOSIS — I5022 Chronic systolic (congestive) heart failure: Secondary | ICD-10-CM | POA: Diagnosis not present

## 2017-08-01 DIAGNOSIS — D631 Anemia in chronic kidney disease: Secondary | ICD-10-CM | POA: Diagnosis not present

## 2017-08-02 DIAGNOSIS — D509 Iron deficiency anemia, unspecified: Secondary | ICD-10-CM | POA: Diagnosis not present

## 2017-08-02 DIAGNOSIS — N2581 Secondary hyperparathyroidism of renal origin: Secondary | ICD-10-CM | POA: Diagnosis not present

## 2017-08-02 DIAGNOSIS — D631 Anemia in chronic kidney disease: Secondary | ICD-10-CM | POA: Diagnosis not present

## 2017-08-02 DIAGNOSIS — E1129 Type 2 diabetes mellitus with other diabetic kidney complication: Secondary | ICD-10-CM | POA: Diagnosis not present

## 2017-08-02 DIAGNOSIS — N186 End stage renal disease: Secondary | ICD-10-CM | POA: Diagnosis not present

## 2017-08-05 DIAGNOSIS — E1129 Type 2 diabetes mellitus with other diabetic kidney complication: Secondary | ICD-10-CM | POA: Diagnosis not present

## 2017-08-05 DIAGNOSIS — N186 End stage renal disease: Secondary | ICD-10-CM | POA: Diagnosis not present

## 2017-08-05 DIAGNOSIS — N2581 Secondary hyperparathyroidism of renal origin: Secondary | ICD-10-CM | POA: Diagnosis not present

## 2017-08-05 DIAGNOSIS — D631 Anemia in chronic kidney disease: Secondary | ICD-10-CM | POA: Diagnosis not present

## 2017-08-05 DIAGNOSIS — D509 Iron deficiency anemia, unspecified: Secondary | ICD-10-CM | POA: Diagnosis not present

## 2017-08-06 DIAGNOSIS — I5022 Chronic systolic (congestive) heart failure: Secondary | ICD-10-CM | POA: Diagnosis not present

## 2017-08-06 DIAGNOSIS — Z471 Aftercare following joint replacement surgery: Secondary | ICD-10-CM | POA: Diagnosis not present

## 2017-08-06 DIAGNOSIS — D631 Anemia in chronic kidney disease: Secondary | ICD-10-CM | POA: Diagnosis not present

## 2017-08-06 DIAGNOSIS — E1122 Type 2 diabetes mellitus with diabetic chronic kidney disease: Secondary | ICD-10-CM | POA: Diagnosis not present

## 2017-08-06 DIAGNOSIS — I132 Hypertensive heart and chronic kidney disease with heart failure and with stage 5 chronic kidney disease, or end stage renal disease: Secondary | ICD-10-CM | POA: Diagnosis not present

## 2017-08-06 DIAGNOSIS — N186 End stage renal disease: Secondary | ICD-10-CM | POA: Diagnosis not present

## 2017-08-07 DIAGNOSIS — E1129 Type 2 diabetes mellitus with other diabetic kidney complication: Secondary | ICD-10-CM | POA: Diagnosis not present

## 2017-08-07 DIAGNOSIS — D509 Iron deficiency anemia, unspecified: Secondary | ICD-10-CM | POA: Diagnosis not present

## 2017-08-07 DIAGNOSIS — N2581 Secondary hyperparathyroidism of renal origin: Secondary | ICD-10-CM | POA: Diagnosis not present

## 2017-08-07 DIAGNOSIS — D631 Anemia in chronic kidney disease: Secondary | ICD-10-CM | POA: Diagnosis not present

## 2017-08-07 DIAGNOSIS — N186 End stage renal disease: Secondary | ICD-10-CM | POA: Diagnosis not present

## 2017-08-08 DIAGNOSIS — I132 Hypertensive heart and chronic kidney disease with heart failure and with stage 5 chronic kidney disease, or end stage renal disease: Secondary | ICD-10-CM | POA: Diagnosis not present

## 2017-08-08 DIAGNOSIS — E1122 Type 2 diabetes mellitus with diabetic chronic kidney disease: Secondary | ICD-10-CM | POA: Diagnosis not present

## 2017-08-08 DIAGNOSIS — Z471 Aftercare following joint replacement surgery: Secondary | ICD-10-CM | POA: Diagnosis not present

## 2017-08-08 DIAGNOSIS — I5022 Chronic systolic (congestive) heart failure: Secondary | ICD-10-CM | POA: Diagnosis not present

## 2017-08-08 DIAGNOSIS — D631 Anemia in chronic kidney disease: Secondary | ICD-10-CM | POA: Diagnosis not present

## 2017-08-08 DIAGNOSIS — N186 End stage renal disease: Secondary | ICD-10-CM | POA: Diagnosis not present

## 2017-08-09 DIAGNOSIS — N186 End stage renal disease: Secondary | ICD-10-CM | POA: Diagnosis not present

## 2017-08-09 DIAGNOSIS — D509 Iron deficiency anemia, unspecified: Secondary | ICD-10-CM | POA: Diagnosis not present

## 2017-08-09 DIAGNOSIS — E1129 Type 2 diabetes mellitus with other diabetic kidney complication: Secondary | ICD-10-CM | POA: Diagnosis not present

## 2017-08-09 DIAGNOSIS — Z96641 Presence of right artificial hip joint: Secondary | ICD-10-CM | POA: Diagnosis not present

## 2017-08-09 DIAGNOSIS — D631 Anemia in chronic kidney disease: Secondary | ICD-10-CM | POA: Diagnosis not present

## 2017-08-09 DIAGNOSIS — Z471 Aftercare following joint replacement surgery: Secondary | ICD-10-CM | POA: Diagnosis not present

## 2017-08-09 DIAGNOSIS — N2581 Secondary hyperparathyroidism of renal origin: Secondary | ICD-10-CM | POA: Diagnosis not present

## 2017-08-12 DIAGNOSIS — D631 Anemia in chronic kidney disease: Secondary | ICD-10-CM | POA: Diagnosis not present

## 2017-08-12 DIAGNOSIS — N2581 Secondary hyperparathyroidism of renal origin: Secondary | ICD-10-CM | POA: Diagnosis not present

## 2017-08-12 DIAGNOSIS — N186 End stage renal disease: Secondary | ICD-10-CM | POA: Diagnosis not present

## 2017-08-12 DIAGNOSIS — D509 Iron deficiency anemia, unspecified: Secondary | ICD-10-CM | POA: Diagnosis not present

## 2017-08-12 DIAGNOSIS — E1129 Type 2 diabetes mellitus with other diabetic kidney complication: Secondary | ICD-10-CM | POA: Diagnosis not present

## 2017-08-14 DIAGNOSIS — E1129 Type 2 diabetes mellitus with other diabetic kidney complication: Secondary | ICD-10-CM | POA: Diagnosis not present

## 2017-08-14 DIAGNOSIS — D509 Iron deficiency anemia, unspecified: Secondary | ICD-10-CM | POA: Diagnosis not present

## 2017-08-14 DIAGNOSIS — N2581 Secondary hyperparathyroidism of renal origin: Secondary | ICD-10-CM | POA: Diagnosis not present

## 2017-08-14 DIAGNOSIS — N186 End stage renal disease: Secondary | ICD-10-CM | POA: Diagnosis not present

## 2017-08-14 DIAGNOSIS — D631 Anemia in chronic kidney disease: Secondary | ICD-10-CM | POA: Diagnosis not present

## 2017-08-16 DIAGNOSIS — E1129 Type 2 diabetes mellitus with other diabetic kidney complication: Secondary | ICD-10-CM | POA: Diagnosis not present

## 2017-08-16 DIAGNOSIS — D509 Iron deficiency anemia, unspecified: Secondary | ICD-10-CM | POA: Diagnosis not present

## 2017-08-16 DIAGNOSIS — N186 End stage renal disease: Secondary | ICD-10-CM | POA: Diagnosis not present

## 2017-08-16 DIAGNOSIS — D631 Anemia in chronic kidney disease: Secondary | ICD-10-CM | POA: Diagnosis not present

## 2017-08-16 DIAGNOSIS — N2581 Secondary hyperparathyroidism of renal origin: Secondary | ICD-10-CM | POA: Diagnosis not present

## 2017-08-18 DIAGNOSIS — E1129 Type 2 diabetes mellitus with other diabetic kidney complication: Secondary | ICD-10-CM | POA: Diagnosis not present

## 2017-08-18 DIAGNOSIS — D509 Iron deficiency anemia, unspecified: Secondary | ICD-10-CM | POA: Diagnosis not present

## 2017-08-18 DIAGNOSIS — N2581 Secondary hyperparathyroidism of renal origin: Secondary | ICD-10-CM | POA: Diagnosis not present

## 2017-08-18 DIAGNOSIS — N186 End stage renal disease: Secondary | ICD-10-CM | POA: Diagnosis not present

## 2017-08-18 DIAGNOSIS — D631 Anemia in chronic kidney disease: Secondary | ICD-10-CM | POA: Diagnosis not present

## 2017-08-21 DIAGNOSIS — N186 End stage renal disease: Secondary | ICD-10-CM | POA: Diagnosis not present

## 2017-08-21 DIAGNOSIS — D631 Anemia in chronic kidney disease: Secondary | ICD-10-CM | POA: Diagnosis not present

## 2017-08-21 DIAGNOSIS — D509 Iron deficiency anemia, unspecified: Secondary | ICD-10-CM | POA: Diagnosis not present

## 2017-08-21 DIAGNOSIS — N2581 Secondary hyperparathyroidism of renal origin: Secondary | ICD-10-CM | POA: Diagnosis not present

## 2017-08-21 DIAGNOSIS — E1129 Type 2 diabetes mellitus with other diabetic kidney complication: Secondary | ICD-10-CM | POA: Diagnosis not present

## 2017-08-23 DIAGNOSIS — D509 Iron deficiency anemia, unspecified: Secondary | ICD-10-CM | POA: Diagnosis not present

## 2017-08-23 DIAGNOSIS — N186 End stage renal disease: Secondary | ICD-10-CM | POA: Diagnosis not present

## 2017-08-23 DIAGNOSIS — E1129 Type 2 diabetes mellitus with other diabetic kidney complication: Secondary | ICD-10-CM | POA: Diagnosis not present

## 2017-08-23 DIAGNOSIS — N2581 Secondary hyperparathyroidism of renal origin: Secondary | ICD-10-CM | POA: Diagnosis not present

## 2017-08-23 DIAGNOSIS — D631 Anemia in chronic kidney disease: Secondary | ICD-10-CM | POA: Diagnosis not present

## 2017-08-24 ENCOUNTER — Emergency Department (HOSPITAL_COMMUNITY): Payer: Medicare Other

## 2017-08-24 ENCOUNTER — Observation Stay (HOSPITAL_COMMUNITY)
Admission: EM | Admit: 2017-08-24 | Discharge: 2017-08-25 | Disposition: A | Discharge status: Home / Self Care | Payer: Medicare Other | Attending: Orthopedic Surgery | Admitting: Orthopedic Surgery

## 2017-08-24 ENCOUNTER — Encounter (HOSPITAL_COMMUNITY): Payer: Self-pay

## 2017-08-24 DIAGNOSIS — S73004A Unspecified dislocation of right hip, initial encounter: Secondary | ICD-10-CM | POA: Diagnosis present

## 2017-08-24 DIAGNOSIS — I252 Old myocardial infarction: Secondary | ICD-10-CM | POA: Diagnosis not present

## 2017-08-24 DIAGNOSIS — N186 End stage renal disease: Secondary | ICD-10-CM

## 2017-08-24 DIAGNOSIS — Z96641 Presence of right artificial hip joint: Secondary | ICD-10-CM | POA: Diagnosis present

## 2017-08-24 DIAGNOSIS — Z9884 Bariatric surgery status: Secondary | ICD-10-CM | POA: Diagnosis not present

## 2017-08-24 DIAGNOSIS — S73014A Posterior dislocation of right hip, initial encounter: Principal | ICD-10-CM | POA: Diagnosis present

## 2017-08-24 DIAGNOSIS — Z7982 Long term (current) use of aspirin: Secondary | ICD-10-CM | POA: Diagnosis not present

## 2017-08-24 DIAGNOSIS — Z950 Presence of cardiac pacemaker: Secondary | ICD-10-CM | POA: Insufficient documentation

## 2017-08-24 DIAGNOSIS — E1122 Type 2 diabetes mellitus with diabetic chronic kidney disease: Secondary | ICD-10-CM | POA: Diagnosis not present

## 2017-08-24 DIAGNOSIS — G4733 Obstructive sleep apnea (adult) (pediatric): Secondary | ICD-10-CM | POA: Insufficient documentation

## 2017-08-24 DIAGNOSIS — Z992 Dependence on renal dialysis: Secondary | ICD-10-CM | POA: Insufficient documentation

## 2017-08-24 DIAGNOSIS — I132 Hypertensive heart and chronic kidney disease with heart failure and with stage 5 chronic kidney disease, or end stage renal disease: Secondary | ICD-10-CM | POA: Diagnosis not present

## 2017-08-24 DIAGNOSIS — I447 Left bundle-branch block, unspecified: Secondary | ICD-10-CM | POA: Insufficient documentation

## 2017-08-24 DIAGNOSIS — Z941 Heart transplant status: Secondary | ICD-10-CM | POA: Insufficient documentation

## 2017-08-24 DIAGNOSIS — I509 Heart failure, unspecified: Secondary | ICD-10-CM | POA: Diagnosis not present

## 2017-08-24 DIAGNOSIS — I110 Hypertensive heart disease with heart failure: Secondary | ICD-10-CM | POA: Diagnosis not present

## 2017-08-24 DIAGNOSIS — I4891 Unspecified atrial fibrillation: Secondary | ICD-10-CM | POA: Diagnosis not present

## 2017-08-24 DIAGNOSIS — E785 Hyperlipidemia, unspecified: Secondary | ICD-10-CM | POA: Diagnosis not present

## 2017-08-24 DIAGNOSIS — Z8673 Personal history of transient ischemic attack (TIA), and cerebral infarction without residual deficits: Secondary | ICD-10-CM | POA: Diagnosis not present

## 2017-08-24 DIAGNOSIS — Z880 Allergy status to penicillin: Secondary | ICD-10-CM | POA: Diagnosis not present

## 2017-08-24 DIAGNOSIS — Z79899 Other long term (current) drug therapy: Secondary | ICD-10-CM | POA: Diagnosis not present

## 2017-08-24 DIAGNOSIS — Z471 Aftercare following joint replacement surgery: Secondary | ICD-10-CM | POA: Diagnosis not present

## 2017-08-24 DIAGNOSIS — Z8249 Family history of ischemic heart disease and other diseases of the circulatory system: Secondary | ICD-10-CM | POA: Insufficient documentation

## 2017-08-24 DIAGNOSIS — R52 Pain, unspecified: Secondary | ICD-10-CM | POA: Diagnosis not present

## 2017-08-24 DIAGNOSIS — M25552 Pain in left hip: Secondary | ICD-10-CM | POA: Diagnosis not present

## 2017-08-24 DIAGNOSIS — T84020A Dislocation of internal right hip prosthesis, initial encounter: Secondary | ICD-10-CM | POA: Diagnosis not present

## 2017-08-24 LAB — I-STAT CHEM 8, ED
BUN: 33 mg/dL — ABNORMAL HIGH (ref 6–20)
CHLORIDE: 99 mmol/L — AB (ref 101–111)
CREATININE: 9.5 mg/dL — AB (ref 0.61–1.24)
Calcium, Ion: 1.13 mmol/L — ABNORMAL LOW (ref 1.15–1.40)
GLUCOSE: 94 mg/dL (ref 65–99)
HCT: 27 % — ABNORMAL LOW (ref 39.0–52.0)
Hemoglobin: 9.2 g/dL — ABNORMAL LOW (ref 13.0–17.0)
POTASSIUM: 4.7 mmol/L (ref 3.5–5.1)
Sodium: 138 mmol/L (ref 135–145)
TCO2: 29 mmol/L (ref 22–32)

## 2017-08-24 LAB — CBC WITH DIFFERENTIAL/PLATELET
BASOS ABS: 0.1 10*3/uL (ref 0.0–0.1)
Basophils Relative: 0 %
Eosinophils Absolute: 0.1 10*3/uL (ref 0.0–0.7)
Eosinophils Relative: 1 %
HEMATOCRIT: 29.3 % — AB (ref 39.0–52.0)
HEMOGLOBIN: 9.4 g/dL — AB (ref 13.0–17.0)
LYMPHS PCT: 11 %
Lymphs Abs: 1.2 10*3/uL (ref 0.7–4.0)
MCH: 29.7 pg (ref 26.0–34.0)
MCHC: 32.1 g/dL (ref 30.0–36.0)
MCV: 92.4 fL (ref 78.0–100.0)
Monocytes Absolute: 0.5 10*3/uL (ref 0.1–1.0)
Monocytes Relative: 5 %
NEUTROS ABS: 9.6 10*3/uL — AB (ref 1.7–7.7)
NEUTROS PCT: 83 %
Platelets: 224 10*3/uL (ref 150–400)
RBC: 3.17 MIL/uL — AB (ref 4.22–5.81)
RDW: 16.1 % — ABNORMAL HIGH (ref 11.5–15.5)
WBC: 11.5 10*3/uL — AB (ref 4.0–10.5)

## 2017-08-24 MED ORDER — ASPIRIN 81 MG PO CHEW
81.0000 mg | CHEWABLE_TABLET | Freq: Two times a day (BID) | ORAL | Status: DC
  Administered 2017-08-24 – 2017-08-25 (×2): 81 mg via ORAL
  Filled 2017-08-24 (×2): qty 1

## 2017-08-24 MED ORDER — MYCOPHENOLATE SODIUM 180 MG PO TBEC
720.0000 mg | DELAYED_RELEASE_TABLET | Freq: Two times a day (BID) | ORAL | Status: DC
Start: 2017-08-24 — End: 2017-08-25
  Administered 2017-08-24 – 2017-08-25 (×2): 720 mg via ORAL
  Filled 2017-08-24 (×2): qty 4

## 2017-08-24 MED ORDER — KETAMINE HCL-SODIUM CHLORIDE 100-0.9 MG/10ML-% IV SOSY
1.0000 mg/kg | PREFILLED_SYRINGE | Freq: Once | INTRAVENOUS | Status: DC
  Filled 2017-08-24: qty 20

## 2017-08-24 MED ORDER — PANTOPRAZOLE SODIUM 40 MG PO TBEC
40.0000 mg | DELAYED_RELEASE_TABLET | Freq: Every day | ORAL | Status: DC
  Administered 2017-08-24 – 2017-08-25 (×2): 40 mg via ORAL
  Filled 2017-08-24 (×2): qty 1

## 2017-08-24 MED ORDER — METHOCARBAMOL 1000 MG/10ML IJ SOLN
500.0000 mg | Freq: Four times a day (QID) | INTRAVENOUS | Status: DC | PRN
  Filled 2017-08-24: qty 5

## 2017-08-24 MED ORDER — PROPOFOL 10 MG/ML IV BOLUS
0.5000 mg/kg | Freq: Once | INTRAVENOUS | Status: DC
Start: 2017-08-24 — End: 2017-08-25
  Filled 2017-08-24: qty 20

## 2017-08-24 MED ORDER — TEMAZEPAM 15 MG PO CAPS
15.0000 mg | ORAL_CAPSULE | Freq: Every day | ORAL | Status: DC
  Administered 2017-08-24: 15 mg via ORAL
  Filled 2017-08-24: qty 1

## 2017-08-24 MED ORDER — CYCLOSPORINE 25 MG PO CAPS
175.0000 mg | ORAL_CAPSULE | Freq: Two times a day (BID) | ORAL | Status: DC
  Administered 2017-08-24 – 2017-08-25 (×2): 175 mg via ORAL
  Filled 2017-08-24 (×2): qty 7

## 2017-08-24 MED ORDER — ONDANSETRON HCL 4 MG PO TABS: 4.0000 mg | ORAL_TABLET | Freq: Four times a day (QID) | ORAL | Status: DC | PRN

## 2017-08-24 MED ORDER — OXYCODONE HCL 5 MG PO TABS: 5.0000 mg | ORAL_TABLET | ORAL | Status: DC | PRN

## 2017-08-24 MED ORDER — MORPHINE SULFATE (PF) 4 MG/ML IV SOLN
4.0000 mg | Freq: Once | INTRAVENOUS | Status: AC
  Administered 2017-08-24: 4 mg via INTRAVENOUS
  Filled 2017-08-24: qty 1

## 2017-08-24 MED ORDER — KETAMINE HCL 10 MG/ML IJ SOLN
INTRAMUSCULAR | Status: DC | PRN
  Administered 2017-08-24: 10 mg via INTRAVENOUS
  Administered 2017-08-24: 25 mg via INTRAVENOUS

## 2017-08-24 MED ORDER — ACETAMINOPHEN 650 MG RE SUPP: 650.0000 mg | Freq: Four times a day (QID) | RECTAL | Status: DC | PRN

## 2017-08-24 MED ORDER — VENLAFAXINE HCL 37.5 MG PO TABS: 37.5000 mg | ORAL_TABLET | Freq: Two times a day (BID) | ORAL | Status: DC

## 2017-08-24 MED ORDER — PRAVASTATIN SODIUM 40 MG PO TABS: 40.0000 mg | ORAL_TABLET | Freq: Every day | ORAL | Status: DC

## 2017-08-24 MED ORDER — DOCUSATE SODIUM 100 MG PO CAPS
100.0000 mg | ORAL_CAPSULE | Freq: Two times a day (BID) | ORAL | Status: DC
  Administered 2017-08-24 – 2017-08-25 (×2): 100 mg via ORAL
  Filled 2017-08-24 (×2): qty 1

## 2017-08-24 MED ORDER — ACETAMINOPHEN 325 MG PO TABS: 650.0000 mg | ORAL_TABLET | Freq: Four times a day (QID) | ORAL | Status: DC | PRN

## 2017-08-24 MED ORDER — CYCLOSPORINE MODIFIED (NEORAL) 100 MG PO CAPS: 175.0000 mg | ORAL_CAPSULE | Freq: Two times a day (BID) | ORAL | Status: DC

## 2017-08-24 MED ORDER — CYCLOSPORINE MODIFIED (NEORAL) 25 MG PO CAPS: 75.0000 mg | ORAL_CAPSULE | Freq: Two times a day (BID) | ORAL | Status: DC

## 2017-08-24 MED ORDER — SENNA 8.6 MG PO TABS
2.0000 | ORAL_TABLET | Freq: Every day | ORAL | Status: DC
  Administered 2017-08-24: 17.2 mg via ORAL
  Filled 2017-08-24: qty 2

## 2017-08-24 MED ORDER — SEVELAMER CARBONATE 800 MG PO TABS
3200.0000 mg | ORAL_TABLET | Freq: Three times a day (TID) | ORAL | Status: DC
  Administered 2017-08-25: 3200 mg via ORAL
  Filled 2017-08-24: qty 4

## 2017-08-24 MED ORDER — PROPOFOL 10 MG/ML IV BOLUS
INTRAVENOUS | Status: DC | PRN
  Administered 2017-08-24: 25 mg via INTRAVENOUS
  Administered 2017-08-24: 10 mg via INTRAVENOUS

## 2017-08-24 MED ORDER — METHOCARBAMOL 500 MG PO TABS
500.0000 mg | ORAL_TABLET | Freq: Four times a day (QID) | ORAL | Status: DC | PRN
  Administered 2017-08-25: 500 mg via ORAL
  Filled 2017-08-24: qty 1

## 2017-08-24 MED ORDER — HYDROMORPHONE HCL 1 MG/ML IJ SOLN
1.0000 mg | Freq: Once | INTRAMUSCULAR | Status: AC
  Administered 2017-08-24: 1 mg via INTRAVENOUS
  Filled 2017-08-24: qty 1

## 2017-08-24 MED ORDER — CITALOPRAM HYDROBROMIDE 20 MG PO TABS
20.0000 mg | ORAL_TABLET | Freq: Every day | ORAL | Status: DC
  Administered 2017-08-25: 20 mg via ORAL
  Filled 2017-08-24: qty 1

## 2017-08-24 MED ORDER — SEVELAMER CARBONATE 800 MG PO TABS: 1600.0000 mg | ORAL_TABLET | Freq: Every day | ORAL | Status: DC | PRN

## 2017-08-24 NOTE — ED Provider Notes (Signed)
[facility] EMERGENCY DEPARTMENT Provider Note   CSN: [id number] Arrival date & time: 08/24/17  1548     History   Chief Complaint Chief Complaint  Patient presents with  . Hip Pain    HPI [name] is a 56 y.o. male who recently underwent a right total hip arthroplasty by Dr. Dolly Rias on 11/26 who presents here today for right hip pain that began at approximately 2:45 PM today.  Patient was out to eat with family at a restaurant and when he sat down in [place] he felt a pop in his right hip.  The patient tried to stand up after the event but felt pain over the right hip and was unable to bear weight.  He denies any fall or injury to the hip.  Patient was transported by EMS and given 100 mcg of fentanyl in route with minimal relief of pain.  His current pain level is 9/10.  He is not follow-up with his orthopedist yet.  He notes that 5 days ago he had a similar occurrence of the pain and called the office and was told this is likely due to a tendon and not dislocation.  I am unable to see these notes in the system.  Patient denies any complications such as joint swelling, fever, decreased range of motion of the hip PTA.  He denies any paresthesias distal to the hip.  He has been unable to ambulate since the event.   HPI  Past Medical History:  Diagnosis Date  . Arthritis   . Atrial fibrillation ([facility])   . CHF (congestive heart failure), NYHA class III (HCC)    s/p heart transplant  . Chronic kidney disease    end stage  . Chronic systolic dysfunction of left ventricle   . CVA (cerebral infarction)   . Diabetes mellitus    PMH; Prior to heart transplant  . Family history of coronary artery disease    in both parents  . Hyperlipidemia   . Hypertension   . Left bundle branch block   . Morbid obesity ([place])    status post lap band  . Myocardial infarction [facility])    prior to heart transplant  . Nonischemic cardiomyopathy ([place])    prior to heart  transplant  . Obesity (BMI 30-39.9)   . Obstructive sleep apnea    no longer needs CPAP after heart transplant per pt  . Premature ventricular contractions   . SOB (shortness of breath)   . Stroke [facility])     Patient Active Problem List   Diagnosis Date Noted  . Osteoarthritis of right hip 07/22/2017  . VENTRICULAR TACHYCARDIA 12/21/2009  . HYPERLIPIDEMIA 08/30/2009  . HYPERTENSION, UNSPECIFIED 08/30/2009  . ISCHEMIC CARDIOMYOPATHY 08/30/2009  . ATRIAL FIBRILLATION 08/30/2009  . VENTRICULAR ECTOPY 08/30/2009  . CEREBROVASCULAR ACCIDENT, HX OF 08/30/2009  . MORBID OBESITY, HX OF 08/30/2009  . DIABETES MELLITUS, TYPE II 10/17/2007  . OBSTRUCTIVE SLEEP APNEA 10/17/2007  . CARDIOMYOPATHY, DILATED 10/17/2007    Past Surgical History:  Procedure Laterality Date  . CARDIAC PACEMAKER PLACEMENT  09/21/2009   Biventricular implantable cardioverter-defibrillator implantation     . COLONOSCOPY W/ BIOPSIES AND POLYPECTOMY    . FOOT SURGERY     left  . HEART TRANSPLANT  2014  . LAPAROSCOPIC GASTRIC BANDING  01/27/2007  . LEFT VENTRICULAR ASSIST DEVICE     implanted at [facility]  . TOTAL HIP ARTHROPLASTY Right 07/22/2017   Procedure: RIGHT TOTAL HIP ARTHROPLASTY ANTERIOR APPROACH;  Surgeon: Rod Can, MD;  Location: [place];  Service: Orthopedics;  Laterality: Right;  Needs RNFA  . TOTAL KNEE ARTHROPLASTY Right 07/22/2017       Home Medications    Prior to Admission medications   Medication Sig Start Date End Date Taking? Authorizing Provider  aspirin 81 MG chewable tablet Chew 1 tablet (81 mg total) by mouth 2 (two) times daily. 07/23/17   Swinteck, Aaron Edelman, MD  B Complex-C-Folic Acid (RENO CAPS) 1 MG CAPS TAKE 1 CAPSULE BY MOUTH ONCE DAILY. 03/02/16   [provider]  citalopram (CELEXA) 20 MG tablet TAKE 1 TABLET(20 MG) BY MOUTH EVERY DAY 04/06/16   [provider]  cycloSPORINE (SANDIMMUNE) 25 MG capsule Take 75 mg by mouth 2 (two) times daily. Take 3 capsules (75mg )  in combination with 1 capsule (100mg ) twice daily for total dose 175mg  twice daily    [provider]  cycloSPORINE modified (NEORAL) 100 MG capsule Take 1 capsule in combination with 75mg  capsule twice daily (total dose 175mg  twice daily) 07/16/16   [provider]  cycloSPORINE modified (NEORAL) 25 MG capsule Take 3 capsules (75 mg total) by mouth 2 (two) times daily. In combination with 07/24/17   Swinteck, Aaron Edelman, MD  docusate sodium (COLACE) 100 MG capsule Take 1 capsule (100 mg total) by mouth 2 (two) times daily. 07/23/17   Swinteck, Aaron Edelman, MD  lidocaine (XYLOCAINE) 5 % ointment Apply 1 application topically as needed. Apply to feet twice daily as needed. Patient not taking: Reported on 07/11/2017 01/03/17   Narda Amber K, DO  mycophenolate (MYFORTIC) 360 MG TBEC EC tablet TAKE 720 mg BY MOUTH TWICE DAILY 06/12/16   [provider]  omeprazole (PRILOSEC) 20 MG capsule TAKE 20 mg BY MOUTH DAILY 04/06/16   [provider]  ondansetron (ZOFRAN) 4 MG tablet Take 1 tablet (4 mg total) by mouth every 6 (six) hours as needed for nausea. 07/23/17   Swinteck, Aaron Edelman, MD  oxyCODONE (OXY IR/ROXICODONE) 5 MG immediate release tablet Take 1-2 tablets (5-10 mg total) by mouth every 6 (six) hours as needed for breakthrough pain. 07/23/17   Swinteck, Aaron Edelman, MD  pravastatin (PRAVACHOL) 40 MG tablet Take 1 tablet (40 mg total) by mouth daily at 6 PM. 07/24/17   Swinteck, Aaron Edelman, MD  senna (SENOKOT) 8.6 MG TABS tablet Take 2 tablets (17.2 mg total) by mouth at bedtime. 07/23/17   Swinteck, Aaron Edelman, MD  sevelamer carbonate (RENVELA) 800 MG tablet Take 1,600-3,200 mg See admin instructions by mouth. Take 3200 mg with meals  Take 1600 mg with snacks    [provider]  temazepam (RESTORIL) 15 MG capsule Take 15 mg at bedtime by mouth.  03/24/15   [provider]  venlafaxine (EFFEXOR) 37.5 MG tablet Take half tablet x 1 month, then increase to 1 tablet daily.  On days  of dialysis, take afterwards. Patient not taking: Reported on 07/11/2017 07/01/17   Alda Berthold, DO    Family History Family History  Problem Relation Age of Onset  . Coronary artery disease Father        had, PTCA & CABG  . Heart attack Father   . Hypertension Father   . Diabetes Father   . Coronary artery disease Mother   . Heart attack Mother   . Hypertension Mother   . Stroke Mother   . Lupus Mother     Social History Social History   Tobacco Use  . Smoking status: Never Smoker  . Smokeless  tobacco: Former Systems developer    Types: Snuff  Substance Use Topics  . Alcohol use: No  . Drug use: No     Allergies   Heparin and Penicillin g potassium [penicillin g]   Review of Systems Review of Systems  Musculoskeletal: Positive for arthralgias and gait problem.  All other systems reviewed and are negative.    Physical Exam Updated Vital Signs BP 117/70 (BP Location: Left Arm)   Pulse 80   Temp 98.6 F (37 C) (Oral)   Resp 18   Ht 6\' 5"  (1.956 m)   Wt (!) 144.2 kg (318 lb)   SpO2 97%   BMI 37.71 kg/m   Physical Exam  Constitutional: He appears well-developed and well-nourished.  HENT:  Head: Normocephalic and atraumatic.  Right Ear: External ear normal.  Left Ear: External ear normal.  Eyes: Conjunctivae are normal. Right eye exhibits no discharge. Left eye exhibits no discharge. No scleral icterus.  Cardiovascular:  Pulses:      Dorsalis pedis pulses are 2+ on the right side.       Posterior tibial pulses are 2+ on the right side.  Pulmonary/Chest: Effort normal. No respiratory distress.  Musculoskeletal:  Proximal right anterior thigh with healing wound from anterior surgical approach of right total hip arthroplasty.  Patient notes pain over the right hip and just posterior. Right leg slightly shortened and externally rotated. . Pain with log roll test. Compartments soft distally.  Dorsalis pedis and posterior tib pulses 2+.  Cap refill less than 2 seconds.   Able to move all joints below injury. Sensation intact to light touch.   Neurological: He is alert. He has normal strength. No sensory deficit.  Skin: Skin is warm and dry. Capillary refill takes less than 2 seconds. No pallor.  No skin erythema or joint swelling.  Psychiatric: He has a normal mood and affect.  Nursing note and vitals reviewed.    ED Treatments / Results  Labs (all labs ordered are listed, but only abnormal results are displayed) Labs Reviewed  CBC WITH DIFFERENTIAL/PLATELET - Abnormal; Notable for the following components:      Result Value   WBC 11.5 (*)    RBC 3.17 (*)    Hemoglobin 9.4 (*)    HCT 29.3 (*)    RDW 16.1 (*)    Neutro Abs 9.6 (*)    All other components within normal limits  I-STAT CHEM 8, ED - Abnormal; Notable for the following components:   Chloride 99 (*)    BUN 33 (*)    Creatinine, Ser 9.50 (*)    Calcium, Ion 1.13 (*)    Hemoglobin 9.2 (*)    HCT 27.0 (*)    All other components within normal limits  HIV ANTIBODY (ROUTINE TESTING)    EKG  EKG Interpretation None       Radiology Dg Hip Port Unilat W Or Wo Pelvis 1 View Right  Result Date: 08/24/2017 CLINICAL DATA:  Post reduction EXAM: DG HIP (WITH OR WITHOUT PELVIS) 1V PORT RIGHT COMPARISON:  08/24/2017 FINDINGS: Satisfactory reduction of right hip dislocation. Right hip prosthesis in satisfactory position alignment. No acute fracture IMPRESSION: Satisfactory right hip reduction. Electronically Signed   By: Franchot Gallo M.D.   On: 08/24/2017 20:05   Dg Hip Unilat With Pelvis 2-3 Views Right  Result Date: 08/24/2017 CLINICAL DATA:  Right hip pain and deformity. EXAM: DG HIP (WITH OR WITHOUT PELVIS) 2-3V RIGHT COMPARISON:  07/22/2017 FINDINGS: A right hip total arthroplasty  device is identified. There is pin posterior and superior dislocation of the hip joint. No periprosthetic fracture identified. IMPRESSION: 1. Posterior dislocation of right hip Electronically Signed   By:  Kerby Moors M.D.   On: 08/24/2017 16:57    Procedures Procedures (including critical care time)  Medications Ordered in ED Medications  ketamine 100 mg in normal saline 10 mL (10mg /mL) syringe (not administered)  propofol (DIPRIVAN) 10 mg/mL bolus/IV push 72.1 mg (not administered)  ketamine (KETALAR) injection (10 mg Intravenous Given 08/24/17 1919)  propofol (DIPRIVAN) 10 mg/mL bolus/IV push (10 mg Intravenous Given 08/24/17 1919)  aspirin chewable tablet 81 mg (not administered)  citalopram (CELEXA) tablet 20 mg (not administered)  cycloSPORINE (SANDIMMUNE) capsule 175 mg (not administered)  docusate sodium (COLACE) capsule 100 mg (not administered)  mycophenolate (MYFORTIC) EC tablet 720 mg (not administered)  pantoprazole (PROTONIX) EC tablet 40 mg (not administered)  ondansetron (ZOFRAN) tablet 4 mg (not administered)  pravastatin (PRAVACHOL) tablet 40 mg (not administered)  senna (SENOKOT) tablet 17.2 mg (not administered)  sevelamer carbonate (RENVELA) tablet 3,200 mg (not administered)  temazepam (RESTORIL) capsule 15 mg (not administered)  acetaminophen (TYLENOL) tablet 650 mg (not administered)    Or  acetaminophen (TYLENOL) suppository 650 mg (not administered)  oxyCODONE (Oxy IR/ROXICODONE) immediate release tablet 5-10 mg (not administered)  methocarbamol (ROBAXIN) tablet 500 mg (not administered)    Or  methocarbamol (ROBAXIN) 500 mg in dextrose 5 % 50 mL IVPB (not administered)  sevelamer carbonate (RENVELA) tablet 1,600 mg (not administered)  morphine 4 MG/ML injection 4 mg (4 mg Intravenous Given 08/24/17 1619)  HYDROmorphone (DILAUDID) injection 1 mg (1 mg Intravenous Given 08/24/17 1716)     Initial Impression / Assessment and Plan / ED Course  I have reviewed the triage vital signs and the nursing notes.  Pertinent labs & imaging results that were available during my care of the patient were reviewed by me and considered in my medical decision making  (see chart for details).      55 y.o. male who recently underwent a right total hip arthroplasty by Dr. Dolly Rias on 11/26 here today with posterior dislocation of right hip after flexion of the hip while trying to sit into a booth at approximately 2:45 PM today.  Patient has pain of right hip with palpation and leg is slightly shortened and.  Patient is neurovascularly intact.  Pain controlled in the emergency department.  Consult placed to orthopedics.  Dr. Stann Mainland return my call and advised he would like to attempt a reduction in the emergency department with conscious sedation.  [facility] consulted for knee immobilizer.  Will obtain Chem-8 as patient is a dialysis patient to check kidney function prior to conscious sedation.  Sedation performed by Dr. Tamera Punt with successful reduction by Dr. Stann Mainland.  Patient to be admitted under Dr. Stann Mainland service.  Patient appears safe for admission.  Final Clinical Impressions(s) / ED Diagnoses   Final diagnoses:  Posterior dislocation of right hip, initial encounter [facility])  History of right hip replacement    ED Discharge Orders    None       Lorelle Gibbs 08/24/17 2238    Malvin Johns, MD 08/24/17 [phone]

## 2017-08-24 NOTE — ED Notes (Signed)
ED Provider at bedside. 

## 2017-08-24 NOTE — ED Notes (Signed)
Pt. returned from XR. 

## 2017-08-24 NOTE — Progress Notes (Signed)
Orthopedic Tech Progress Note Patient Details:  [name] 1960/12/18 [id number]  Ortho Devices Type of Ortho Device: Knee Immobilizer Ortho Device/Splint Interventions: Application   Post Interventions Patient Tolerated: Well Instructions Provided: Care of device   [name] 08/24/2017, 6:57 PM

## 2017-08-24 NOTE — ED Notes (Signed)
EMS also noted pt was LVAD pt until 2014 until he received a heart transplant. Denies SOB, CP, N/V, diaphoresis or any cardiac complaint at this time

## 2017-08-24 NOTE — ED Notes (Signed)
Dr. Stann Mainland and Ronalee Belts PA aware pt and this RN are ready for reduction. Ortho tech at the bedside and RT aware

## 2017-08-24 NOTE — ED Triage Notes (Addendum)
Pt with recent hx of R hip replacement 3 weeks ago via EMS for severe R hip pain. Per EMS, pt was at a restaurant and when attempting to sit felt his hip "pop". No fall occurred, pt denies injury to hip. Pt given 119mcg of fentanyl en route with minimal pain relief. Sensation and mobility intact. Pedal pulses 2+, cap refill < 3 secs. No deformity noted. EMS VS: 110/74, 70 HR, RR14. 20 G LAC. A&OX4.

## 2017-08-24 NOTE — Procedures (Signed)
The risk, benefits, and indications for closed reduction were discussed at length with [name].  He did provide informed consent to proceed with closed reduction of right total hip arthroplasty.  Patient was identified in his emergency room bed.  Preprocedural timeout was performed by the emergency department staff and myself.  The correct extremity was identified.  After adequate sedation was delivered by the emergency department physician we proceeded with closed reduction maneuver of the right hip.  With longitudinal traction gentle internal rotation and flexion of the right lower extremity the total hip was successfully reduced.  He was placed in a knee immobilizer.  AP pelvis postprocedural confirmed the concentrically reduced total hip arthroplasty.  There were no immediate complications.  Patient tolerated the procedure well.  He will be admitted to the orthopedic service postoperatively for physical therapy and posterior hip precaution teaching.

## 2017-08-24 NOTE — ED Notes (Signed)
Patient transported to X-ray 

## 2017-08-24 NOTE — Sedation Documentation (Signed)
XR at bedside

## 2017-08-24 NOTE — ED Provider Notes (Signed)
.  Sedation Date/Time: 08/24/2017 6:48 PM Performed by: Malvin Johns, MD Authorized by: Malvin Johns, MD   Consent:    Consent obtained:  Written   Consent given by:  Patient   Risks discussed:  Allergic reaction, inadequate sedation, vomiting, prolonged hypoxia resulting in organ damage, prolonged sedation necessitating reversal and respiratory compromise necessitating ventilatory assistance and intubation Universal protocol:    Procedure explained and questions answered to patient or proxy's satisfaction: yes     Relevant documents present and verified: yes     Test results available and properly labeled: yes     Imaging studies available: yes     Required blood products, implants, devices, and special equipment available: yes     Site/side marked: yes     Immediately prior to procedure a time out was called: yes   Indications:    Procedure performed:  Dislocation reduction   Procedure necessitating sedation performed by:  Different physician   Intended level of sedation:  Moderate (conscious sedation) Pre-sedation assessment:    Time since last food or drink:  6 hours   ASA classification: class 3 - patient with severe systemic disease     Neck mobility: normal     Mouth opening:  3 or more finger widths   Thyromental distance:  3 finger widths   Mallampati score:  II - soft palate, uvula, fauces visible   Pre-sedation assessments completed and reviewed: airway patency, cardiovascular function, hydration status, mental status, nausea/vomiting, pain level, respiratory function and temperature     Pre-sedation assessment completed:  08/24/2017 6:49 PM Immediate pre-procedure details:    Reassessment: Patient reassessed immediately prior to procedure     Reviewed: vital signs, relevant labs/tests and NPO status     Verified: bag valve mask available, emergency equipment available, intubation equipment available, IV patency confirmed, oxygen available and suction available    Procedure details (see MAR for exact dosages):    Preoxygenation:  Nasal cannula   Sedation:  Ketamine and propofol   Intra-procedure monitoring:  Blood pressure monitoring, cardiac monitor, continuous capnometry, continuous pulse oximetry, frequent LOC assessments and frequent vital sign checks   Intra-procedure events: none     Total Provider sedation time (minutes):  10 Post-procedure details:    Post-sedation assessment completed:  08/24/2017 7:29 PM   Attendance: Constant attendance by certified staff until patient recovered     Recovery: Patient returned to pre-procedure baseline     Post-sedation assessments completed and reviewed: airway patency, cardiovascular function, hydration status, mental status, nausea/vomiting, pain level, respiratory function and temperature     Patient is stable for discharge or admission: yes     Patient tolerance:  Tolerated well, no immediate complications    Medical screening examination/treatment/procedure(s) were conducted as a shared visit with non-physician practitioner(s) and myself.  I personally evaluated the patient during the encounter.   EKG Interpretation None      Pt with hip dislocation, recent surgery on hip.  Reduced by Dr. Stann Mainland with sedation performed by me.    Malvin Johns, MD 08/24/17 [phone]

## 2017-08-24 NOTE — H&P (Addendum)
ORTHOPAEDIC H and P  REQUESTING PHYSICIAN: Malvin Johns, MD  PCP:  Carol Ada, MD  Chief Complaint: Right hip dislocation  HPI: [name] is a 56 y.o. male who complains immediate right hip pain as well as a pop of while coming up from a low seated bench in a restaurant earlier today.  He states he had immediate pain and was unable to stand.  He is now a little over a month status post right anterior hip arthroplasty.  He currently denies any numbness or tingling in the right lower extremity.  He states he was doing quite well up until this point.    He does also have a complicated comorbidity profile.  He has in addition to chronic kidney disease on dialysis congestive heart failure with atrial fibrillation.  He states that he will require hemodialysis tomorrow if he is admitted overnight.  Past Medical History:  Diagnosis Date  . Arthritis   . Atrial fibrillation ([place])   . CHF (congestive heart failure), NYHA class III (HCC)    s/p heart transplant  . Chronic kidney disease    end stage  . Chronic systolic dysfunction of left ventricle   . CVA (cerebral infarction)   . Diabetes mellitus    PMH; Prior to heart transplant  . Family history of coronary artery disease    in both parents  . Hyperlipidemia   . Hypertension   . Left bundle branch block   . Morbid obesity ([place])    status post lap band  . Myocardial infarction [facility])    prior to heart transplant  . Nonischemic cardiomyopathy ([place])    prior to heart transplant  . Obesity (BMI 30-39.9)   . Obstructive sleep apnea    no longer needs CPAP after heart transplant per pt  . Premature ventricular contractions   . SOB (shortness of breath)   . Stroke [facility])    Past Surgical History:  Procedure Laterality Date  . CARDIAC PACEMAKER PLACEMENT  09/21/2009   Biventricular implantable cardioverter-defibrillator implantation     . COLONOSCOPY W/ BIOPSIES AND POLYPECTOMY    . FOOT SURGERY     left  . HEART  TRANSPLANT  2014  . LAPAROSCOPIC GASTRIC BANDING  01/27/2007  . LEFT VENTRICULAR ASSIST DEVICE     implanted at [facility]  . TOTAL HIP ARTHROPLASTY Right 07/22/2017   Procedure: RIGHT TOTAL HIP ARTHROPLASTY ANTERIOR APPROACH;  Surgeon: Rod Can, MD;  Location: [place];  Service: Orthopedics;  Laterality: Right;  Needs RNFA  . TOTAL KNEE ARTHROPLASTY Right 07/22/2017   Social History   Socioeconomic History  . Marital status: Married    Spouse name: None  . Number of children: 1  . Years of education: 54  . Highest education level: None  Social Needs  . Financial resource strain: None  . Food insecurity - worry: None  . Food insecurity - inability: None  . Transportation needs - medical: None  . Transportation needs - non-medical: None  Occupational History  . Occupation: retired    Fish farm manager: [employer]  Tobacco Use  . Smoking status: Never Smoker  . Smokeless tobacco: Former Systems developer    Types: Snuff  Substance and Sexual Activity  . Alcohol use: No  . Drug use: No  . Sexual activity: None  Other Topics Concern  . None  Social History Narrative   Lives with wife in a one story home.  Has one child.     Retired from the [employer]  of [place].  Supervisor over transportation.    Education: college.    Family History  Problem Relation Age of Onset  . Coronary artery disease Father        had, PTCA & CABG  . Heart attack Father   . Hypertension Father   . Diabetes Father   . Coronary artery disease Mother   . Heart attack Mother   . Hypertension Mother   . Stroke Mother   . Lupus Mother    Allergies  Allergen Reactions  . Heparin Nausea Only, Swelling and Other (See Comments)     * * HIT * * SWELLING REACTION UNSPECIFIED  DIAPHORESIS  . Penicillin G Potassium [Penicillin G] Nausea Only and Other (See Comments)    DIAPHORESIS   Prior to Admission medications   Medication Sig Start Date End Date Taking? Authorizing Provider  aspirin 81 MG chewable tablet Chew  1 tablet (81 mg total) by mouth 2 (two) times daily. 07/23/17   Swinteck, Aaron Edelman, MD  B Complex-C-Folic Acid (RENO CAPS) 1 MG CAPS TAKE 1 CAPSULE BY MOUTH ONCE DAILY. 03/02/16   [provider]  citalopram (CELEXA) 20 MG tablet TAKE 1 TABLET(20 MG) BY MOUTH EVERY DAY 04/06/16   [provider]  cycloSPORINE (SANDIMMUNE) 25 MG capsule Take 75 mg by mouth 2 (two) times daily. Take 3 capsules (75mg ) in combination with 1 capsule (100mg ) twice daily for total dose 175mg  twice daily    [provider]  cycloSPORINE modified (NEORAL) 100 MG capsule Take 1 capsule in combination with 75mg  capsule twice daily (total dose 175mg  twice daily) 07/16/16   [provider]  cycloSPORINE modified (NEORAL) 25 MG capsule Take 3 capsules (75 mg total) by mouth 2 (two) times daily. In combination with 07/24/17   Swinteck, Aaron Edelman, MD  docusate sodium (COLACE) 100 MG capsule Take 1 capsule (100 mg total) by mouth 2 (two) times daily. 07/23/17   Swinteck, Aaron Edelman, MD  lidocaine (XYLOCAINE) 5 % ointment Apply 1 application topically as needed. Apply to feet twice daily as needed. Patient not taking: Reported on 07/11/2017 01/03/17   Narda Amber K, DO  mycophenolate (MYFORTIC) 360 MG TBEC EC tablet TAKE 720 mg BY MOUTH TWICE DAILY 06/12/16   [provider]  omeprazole (PRILOSEC) 20 MG capsule TAKE 20 mg BY MOUTH DAILY 04/06/16   [provider]  ondansetron (ZOFRAN) 4 MG tablet Take 1 tablet (4 mg total) by mouth every 6 (six) hours as needed for nausea. 07/23/17   Swinteck, Aaron Edelman, MD  oxyCODONE (OXY IR/ROXICODONE) 5 MG immediate release tablet Take 1-2 tablets (5-10 mg total) by mouth every 6 (six) hours as needed for breakthrough pain. 07/23/17   Swinteck, Aaron Edelman, MD  pravastatin (PRAVACHOL) 40 MG tablet Take 1 tablet (40 mg total) by mouth daily at 6 PM. 07/24/17   Swinteck, Aaron Edelman, MD  senna (SENOKOT) 8.6 MG TABS tablet Take 2 tablets (17.2 mg total) by mouth at bedtime.  07/23/17   Swinteck, Aaron Edelman, MD  sevelamer carbonate (RENVELA) 800 MG tablet Take 1,600-3,200 mg See admin instructions by mouth. Take 3200 mg with meals  Take 1600 mg with snacks    [provider]  temazepam (RESTORIL) 15 MG capsule Take 15 mg at bedtime by mouth.  03/24/15   [provider]  venlafaxine (EFFEXOR) 37.5 MG tablet Take half tablet x 1 month, then increase to 1 tablet daily.  On days of dialysis, take afterwards. Patient not taking: Reported on 07/11/2017 07/01/17   Posey Pronto,  Delena Serve, DO   Dg Hip Unilat With Pelvis 2-3 Views Right  Result Date: 08/24/2017 CLINICAL DATA:  Right hip pain and deformity. EXAM: DG HIP (WITH OR WITHOUT PELVIS) 2-3V RIGHT COMPARISON:  07/22/2017 FINDINGS: A right hip total arthroplasty device is identified. There is pin posterior and superior dislocation of the hip joint. No periprosthetic fracture identified. IMPRESSION: 1. Posterior dislocation of right hip Electronically Signed   By: Kerby Moors M.D.   On: 08/24/2017 16:57    Positive ROS: All other systems have been reviewed and were otherwise negative with the exception of those mentioned in the HPI and as above.  Physical Exam: General: Alert, no acute distress Cardiovascular: No pedal edema Respiratory: No cyanosis, no use of accessory musculature GI: No organomegaly, abdomen is soft and non-tender Skin: No lesions in the area of chief complaint Neurologic: Sensation intact distally Psychiatric: Patient is competent for consent with normal mood and affect Lymphatic: No axillary or cervical lymphadenopathy  MUSCULOSKELETAL:  Right lower extremity examination: Right lower extremity is shortened and mildly externally rotated compared to the left.  He has intact sensation light touch distally.  He has good 2+ dorsalis pedis pulse.  He has motor intact distally.  She does have pain with gentle logroll of the hip.  Assessment: 1.  Right prosthetic hip posterior  dislocation.  Plan: -We will plan for closed reduction of right prosthetic hip in the emergency department with conscious sedation provided by emergency department providers. -Postreduction plan will be for observation admission overnight for physical therapy with posterior hip precaution teaching, as well as placing him into an abduction brace from 1 of our outside bracing companies. - Discussed this plan with the patient he is in agreement.  All questions were solicited and answered to his satisfaction.  -Addendum:  Right hip successfully closed reduced.  Our plan will be for observation status admission.  He is placed in a knee immobilizer currently this will be removed when his abduction brace is applied by the biotech orthotist.  But he may be weightbearing as tolerated with posterior hip precautions to the right hip.  He may discharge tomorrow after he is hemodialyzed.  I have consulted the nephrology service to facilitate the dialysis tomorrow.    Nicholes Stairs, MD Cell [phone]    08/24/2017 6:38 PM

## 2017-08-24 NOTE — ED Notes (Addendum)
[name], [place] notified of pt pain level.

## 2017-08-24 NOTE — ED Notes (Signed)
Attempted report x1. 

## 2017-08-25 DIAGNOSIS — E1129 Type 2 diabetes mellitus with other diabetic kidney complication: Secondary | ICD-10-CM | POA: Diagnosis not present

## 2017-08-25 DIAGNOSIS — D509 Iron deficiency anemia, unspecified: Secondary | ICD-10-CM | POA: Diagnosis not present

## 2017-08-25 DIAGNOSIS — D631 Anemia in chronic kidney disease: Secondary | ICD-10-CM | POA: Diagnosis not present

## 2017-08-25 DIAGNOSIS — S73014A Posterior dislocation of right hip, initial encounter: Secondary | ICD-10-CM | POA: Diagnosis not present

## 2017-08-25 DIAGNOSIS — N186 End stage renal disease: Secondary | ICD-10-CM | POA: Diagnosis not present

## 2017-08-25 DIAGNOSIS — N2581 Secondary hyperparathyroidism of renal origin: Secondary | ICD-10-CM | POA: Diagnosis not present

## 2017-08-25 LAB — MRSA PCR SCREENING: MRSA by PCR: NEGATIVE

## 2017-08-25 LAB — HIV ANTIBODY (ROUTINE TESTING W REFLEX): HIV Screen 4th Generation wRfx: NONREACTIVE

## 2017-08-25 NOTE — Evaluation (Signed)
Physical Therapy Evaluation Patient Details Name: [name] MRN: [id number] DOB: 04/09/1961 Today's Date: 08/25/2017   History of Present Illness  [name] is a 56 y.o. male who complains immediate right hip pain as well as a pop of while coming up from a low seated bench in a restaurant earlier today.  He states he had immediate pain and was unable to stand.  He is now a little over a month status post right anterior hip arthroplasty. Now s/p closed reduction; Posterior Hip Prec, WBAT  Clinical Impression   Patient evaluated by Physical Therapy with no further acute PT needs identified as he is discharging today;  All education has been completed and the patient has no further questions. Paged Case Mgr re: rec for HHPT/OT; See below for any follow-up Physical Therapy or equipment needs. PT is signing off. Thank you for this referral.     Follow Up Recommendations Home health PT;Supervision/Assistance - 24 hour(HHOT follow up for ADLs with hip prec) Recommend Outpt PT following HH Course for strengthening   Equipment Recommendations  None recommended by PT(has RW, cane, and 3in1)    Recommendations for Other Services Other (comment)(OT follow up for ADLs with hip prec)     Precautions / Restrictions Precautions Precautions: Posterior Hip Precaution Booklet Issued: Yes (comment) Precaution Comments: Educated pt and significant other on Posterior Hip Prec; Able to restate hip prec at end of session Required Braces or Orthoses: Knee Immobilizer - Right;Other Brace/Splint Other Brace/Splint: Bobby with [employer] delivering R hip abduction brace at end of session Restrictions RLE Weight Bearing: Weight bearing as tolerated      Mobility  Bed Mobility Overal bed mobility: Needs Assistance Bed Mobility: Supine to Sit     Supine to sit: Supervision     General bed mobility comments: Cues for techqniue and posterior hip prec  Transfers Overall transfer level: Needs  assistance Equipment used: Rolling walker (2 wheeled);Straight cane Transfers: Sit to/from Stand Sit to Stand: Min guard(no physical assist)         General transfer comment: Cues for prepositioning RLE for posterior hip precautions; We discussed avoiding sitting down to low surfaces  Ambulation/Gait Ambulation/Gait assistance: Supervision Ambulation Distance (Feet): 25 Feet Assistive device: Straight cane Gait Pattern/deviations: Step-through pattern     General Gait Details: No gross difficulty with walking, though noted heavy use of cane for support; We discussed making sure R foot and toes are pointing straight ahead to avoid any internal rotation, especially with turns  Stairs         General stair comments: We discussed technique for managing steps; Pt reports confidence in ability to manage them  Wheelchair Mobility    Modified Rankin (Stroke Patients Only)       Balance                                             Pertinent Vitals/Pain Pain Assessment: No/denies pain    Home Living Family/patient expects to be discharged to:: Private residence Living Arrangements: Spouse/significant other Available Help at Discharge: Family;Available 24 hours/day Type of Home: House Home Access: Stairs to enter Entrance Stairs-Rails: Right;Can reach Software engineer of Steps: 5 Home Layout: One level Home Equipment: Walker - 2 wheels;Bedside commode      Prior Function Level of Independence: Independent with assistive device(s)  Comments: Had advanced to using the cane     Hand Dominance   Dominant Hand: Right    Extremity/Trunk Assessment   Upper Extremity Assessment Upper Extremity Assessment: Overall WFL for tasks assessed    Lower Extremity Assessment Lower Extremity Assessment: Generalized weakness;RLE deficits/detail RLE Deficits / Details: R hip motion now bound by posterior hip prec        Communication   Communication: No difficulties  Cognition Arousal/Alertness: Awake/alert Behavior During Therapy: WFL for tasks assessed/performed Overall Cognitive Status: Within Functional Limits for tasks assessed                                        General Comments General comments (skin integrity, edema, etc.): We discussed and simulated car transfer    Exercises     Assessment/Plan    PT Assessment All further PT needs can be met in the next venue of care  PT Problem List Decreased strength;Decreased range of motion;Decreased activity tolerance;Decreased balance;Decreased mobility;Decreased knowledge of use of DME;Decreased knowledge of precautions       PT Treatment Interventions      PT Goals (Current goals can be found in the Care Plan section)  Acute Rehab PT Goals Patient Stated Goal: hopes to get out of hospital in time to make it to HD PT Goal Formulation: All assessment and education complete, DC therapy    Frequency     Barriers to discharge        Co-evaluation               AM-PAC PT "6 Clicks" Daily Activity  Outcome Measure Difficulty turning over in bed (including adjusting bedclothes, sheets and blankets)?: A Lot Difficulty moving from lying on back to sitting on the side of the bed? : A Little Difficulty sitting down on and standing up from a chair with arms (e.g., wheelchair, bedside commode, etc,.)?: A Little Help needed moving to and from a bed to chair (including a wheelchair)?: A Little Help needed walking in hospital room?: A Little Help needed climbing 3-5 steps with a railing? : A Little 6 Click Score: 17    End of Session Equipment Utilized During Treatment: Right knee immobilizer Activity Tolerance: Patient tolerated treatment well Patient left: in chair;with call bell/phone within reach;Other (comment)([employer] delivering abduction brace) Nurse Communication: Mobility status PT Visit Diagnosis: Other  abnormalities of gait and mobility (R26.89);Muscle weakness (generalized) (M62.81)    Time: 0927-1000 PT Time Calculation (min) (ACUTE ONLY): 33 min   Charges:   PT Evaluation $PT Eval Low Complexity: 1 Low PT Treatments $Therapeutic Activity: 8-22 mins   PT G Codes:   PT G-Codes **NOT FOR INPATIENT CLASS** Functional Assessment Tool Used: Clinical judgement Functional Limitation: Mobility: Walking and moving around Mobility: Walking and Moving Around Current Status (P7943): At least 1 percent but less than 20 percent impaired, limited or restricted Mobility: Walking and Moving Around Goal Status [phone]): 0 percent impaired, limited or restricted Mobility: Walking and Moving Around Discharge Status [phone]): At least 1 percent but less than 20 percent impaired, limited or restricted    Roney Marion, [place] Pager [phone] Office [place] 08/25/2017, 10:18 AM

## 2017-08-25 NOTE — Care Management Note (Addendum)
Case Management Note  Patient Details  Name: [name] MRN: [id number] Date of Birth: 11/12/1960  Subjective/Objective:                 Spoke w patient at the bedside. He states he recently finished with [facility] after Hip sx. He States if PT recommends [place] he would like to use [facility] again, otherwise he feels like he does not need any assistance at home. Patient has RW at home.  10:48 Placed referral to [facility] for [facility] PT. No other CM needs   Action/Plan:   Expected Discharge Date:                  Expected Discharge Plan:  Home/Self Care  In-House Referral:     Discharge planning Services  CM Consult  Post Acute Care Choice:    Choice offered to:     DME Arranged:    DME Agency:     HH Arranged:    [place] Agency:     Status of Service:  Completed, signed off  If discussed at [employer] of Stay Meetings, dates discussed:    Additional Comments:  Carles Collet, RN 08/25/2017, 8:00 AM

## 2017-08-25 NOTE — Progress Notes (Addendum)
Subjective:   1 day s/p closed reduction R hip dislocation Patient reports pain as mild.   Pt states had total hip by Dr. Lyla Glassing about a month ago. Reports 2 other prior "dislocations" that he reduced on his own.  Objective: Vital signs in last 24 hours: Temp:  [97.8 F (36.6 C)-98.6 F (37 C)] 97.8 F (36.6 C) (12/30 0409) Pulse Rate:  [69-80] 71 (12/30 0409) Resp:  [10-20] 12 (12/29 2025) BP: (89-125)/(48-86) 94/62 (12/30 0409) SpO2:  [92 %-100 %] 98 % (12/30 0409) Weight:  [144.2 kg (318 lb)] 144.2 kg (318 lb) (12/29 1551)  Intake/Output from previous day: 12/29 0701 - 12/30 0700 In: 240 [P.O.:240] Out: -  Intake/Output this shift: No intake/output data recorded.  Recent Labs    08/24/17 1804 08/24/17 1819  HGB 9.4* 9.2*   Recent Labs    08/24/17 1804 08/24/17 1819  WBC 11.5*  --   RBC 3.17*  --   HCT 29.3* 27.0*  PLT 224  --    Recent Labs    08/24/17 1819  NA 138  K 4.7  CL 99*  BUN 33*  CREATININE 9.50*  GLUCOSE 94   No results for input(s): LABPT, INR in the last 72 hours.  Neurologically intact ABD soft Neurovascular intact Sensation intact distally Intact pulses distally Dorsiflexion/Plantar flexion intact Incision: N/A No cellulitis present Compartment soft no calf pain or sign of DVT  Assessment/Plan:    Advance diet Up with therapy D/C IV fluids  Up with PT, knee immobilizer, WBAT Will consult nephrology to arrange for dialysis today PT eval today, discussion of posterior hip precautions Follow up outpt with Dr. Lyla Glassing May require revision- discussed with pt  Lacie Draft M. 08/25/2017, 8:50 AM   Spoke with Dr. Jonnie Finner (nephrologist) and pt is scheduled for dialysis at 1130. Would need to be discharged by 11am. Otherwise no dialysis until Wednesday due to holiday schedule. Will get PT to see ASAP and prepare for D/C. Discussed with pt's nurse, PT and pt. He has pain meds at home from surgery.

## 2017-08-25 NOTE — Discharge Instructions (Signed)
Hip Dislocation Hip dislocation happens when the ball at the top of the thigh bone (femur) moves out of its normal position in the socket of the hip bone (pelvis). This happens because of severe force to the hip or the area that surrounds it. The hip may dislocate in a forward or backward direction. Backward dislocation (posterior dislocation) is most common. Hip dislocation may involve a broken (fractured) bone. It can also involve damage to nerves, tissues that connect bones together (ligaments), or tissues that connect muscles to bones (tendons). Hip dislocation is an emergency that requires immediate medical treatment. What are the causes? This condition is often caused by a severe, direct hit (blow) to a bent knee. The blow forces the femur back against the hip joint. This type of injury is most commonly caused by a car crash. This injury may also be caused by:  Falls, especially falls from a height.  Contact sports.  Industrial accidents.  What increases the risk? The following factors may make you more likely to dislocate your hip:  Having an artificial (prosthetic) hip.  Being overweight or obese.  Lacking hip strength and flexibility.  Having a defect of the hip joint that is present at birth (congenital).  Having an increased risk for falling.  Participating in contact sports.  What are the signs or symptoms? Symptoms of this condition may include:  Severe pain in the hip area. Pain may get worse when you move or when you try to use your hip to support (bear) your weight.  Inability to move the hip.  Having the leg on the side of the dislocated hip appearing shorter than the other leg.  Inward turning of the foot on the side of the dislocated hip.  Loss of feeling in the lower leg, foot, or ankle.  How is this diagnosed? This condition is diagnosed based on your medical history and a physical exam. You may have X-rays to confirm the diagnosis. You may be examined  by a health care provider who specializes in bone disorders (orthopedist). How is this treated? This condition is treated by moving the ball of your femur back into your hip socket (reduction). Your health care provider may perform a reduction by hand (manual reduction) or surgically. Your health care provider may perform surgical reduction if:  You have a fracture.  You have bone fragmentsin your joint.  You have damaged blood vessels or nerves.  Manual reduction was not successful.  After your reduction, treatment may include:  Crutches.  A splint.  Physical therapy to help strengthen the muscles that hold your hip joint in place.  Follow these instructions at home: If you have a splint:  Do not put pressure on any part of the splint until it is fully hardened. This may take several hours.  Wear the splint as told by your health care provider. Remove it only as told by your health care provider.  Loosen the splint if your toes tingle, become numb, or turn cold and blue.  Do not let your splint get wet if it is not waterproof. ? Do not take baths, swim, or use a hot tub until your health care provider approves. Ask your health care provider if you can take showers. You may only be allowed to take sponge baths for bathing. ? If your splint is not waterproof, cover it with a watertight plastic bag when you take a bath or a shower.  Keep the splint clean. Managing pain, stiffness, and swelling  If directed, put ice on the injured area: ? Put ice in a plastic bag. ? Place a towel between your skin and the bag. ? Leave the ice on for 20 minutes, 2-3 times a day.  Wear compression stockings or wraps as told by your health care provider.  Move your toes often to avoid stiffness and to lessen swelling. Driving  Do not drive or operate heavy machinery while taking prescription pain medicine, or as told by your health care provider.  Ask your health care provider when it is  safe to drive if you have a splint on your hip. Activity  Return to your normal activities as told by your health care provider. Ask your health care provider what activities are safe for you.  If physical therapy was prescribed, do exercises as told by your health care provider. Safety  Do not use your hip to bear weight until your health care provider says that you can. Use crutches or a walker as told by your health care provider. General instructions  Do not any use tobacco products, such as cigarettes, chewing tobacco, and e-cigarettes. Tobacco can delay bone healing. If you need help quitting, ask your health care provider.  Take over-the-counter and prescription medicines only as told by your health care provider.  Keep all follow-up visits as told by your health care provider. This is important. How is this prevented?  If you have difficulty walking or keeping your balance, try using a cane or a walker for stability. If you feel unstable, sit down right away.  Exercise regularly, as told by your health care provider.  Warm up and stretch before being active.  Cool down and stretch after being active. Contact a health care provider if:  You have pain that gets worse or does not get better with medicine.  You have swelling or red skin on your hip area or your leg.  You cannot move any part of your hip or leg.  You have a tingling sensation in any part of your hip, leg, or foot. Get help right away if:  You feel like your hip has dislocated again.  You have severe pain in your hip or groin.  You have numbness or weakness in your leg. If you have symptoms of a hip dislocation, do not wait to see if the symptoms will go away. Get medical help right away. Call your local emergency services (911 in the [place] Do not drive yourself to the hospital. This information is not intended to replace advice given to you by your health care provider. Make sure you discuss any  questions you have with your health care provider. Document Released: 05/08/2001 Document Revised: 01/19/2016 Document Reviewed: 03/15/2015 Elsevier Interactive Patient Education  [employer].

## 2017-08-25 NOTE — Care Management Obs Status (Signed)
[facility] NOTIFICATION   Patient Details  Name: [name] MRN: [id number] Date of Birth: 1961-03-24   Medicare Observation Status Notification Given:  Yes    Carles Collet, RN 08/25/2017, 8:02 AM

## 2017-08-25 NOTE — ED Notes (Signed)
165 mg of ketamine wasted with Rolan Bucco, RN in sharps container. Dose verbally ordered by Dr. Tamera Punt less than ordered dose entered in computer by Legrand Como, [place]. 165 mg remainder of ketamine placed in sharps, witnessed by [employer]

## 2017-08-25 NOTE — Plan of Care (Signed)
  Education: Knowledge of General Education information will improve 08/25/2017 0410 - Progressing by Anson Fret, RN Note POC and orders reviewed with pt. and wife.

## 2017-08-25 NOTE — ED Notes (Addendum)
Dr. Stann Mainland paged by this RN regarding informed consent signature. Spoke with Dr. Stann Mainland, per Dr. Stann Mainland, will physically sign electronic consent form. Prior to procedure, pt signature obtained on consent form witnessed by this RN in addition to Dr. Osborne Oman PA, and Dr Stann Mainland. Procedure was explained in detail by all providers mentioned.

## 2017-08-25 NOTE — Progress Notes (Signed)
Pt. transported from ER via stretcher to 5N-05; alert and oriented x4; immoblizer to (R) leg and no c/o's pain and no N/T. Wife with pt. Pt. oriented to call button and room; orders reviewed with pt./wife and informed WBAT for (R) leg.

## 2017-08-25 NOTE — Progress Notes (Signed)
Pt. asked earlier about dialysis for today; called dialysis several times and got Tori this time and told that pt. not on the schedule yet; informed pt.

## 2017-08-26 DIAGNOSIS — T8620 Unspecified complication of heart transplant: Secondary | ICD-10-CM | POA: Diagnosis not present

## 2017-08-26 DIAGNOSIS — Z992 Dependence on renal dialysis: Secondary | ICD-10-CM | POA: Diagnosis not present

## 2017-08-26 DIAGNOSIS — N186 End stage renal disease: Secondary | ICD-10-CM | POA: Diagnosis not present

## 2017-08-28 DIAGNOSIS — D631 Anemia in chronic kidney disease: Secondary | ICD-10-CM | POA: Diagnosis not present

## 2017-08-28 DIAGNOSIS — E1129 Type 2 diabetes mellitus with other diabetic kidney complication: Secondary | ICD-10-CM | POA: Diagnosis not present

## 2017-08-28 DIAGNOSIS — N2581 Secondary hyperparathyroidism of renal origin: Secondary | ICD-10-CM | POA: Diagnosis not present

## 2017-08-28 DIAGNOSIS — N186 End stage renal disease: Secondary | ICD-10-CM | POA: Diagnosis not present

## 2017-08-28 DIAGNOSIS — D509 Iron deficiency anemia, unspecified: Secondary | ICD-10-CM | POA: Diagnosis not present

## 2017-08-28 NOTE — Discharge Summary (Signed)
Patient ID: [name] MRN: [id number] DOB/AGE: 1960/09/12 57 y.o.  Admit date: 08/24/2017 Discharge date: 08/28/2017  Admission Diagnoses:  Active Problems:   Closed posterior dislocation of hip, right, initial encounter ([place])   End stage renal disease on dialysis [facility])   Hip dislocation, right [facility])   Discharge Diagnoses:  Same  Past Medical History:  Diagnosis Date  . Arthritis   . Atrial fibrillation ([place])   . CHF (congestive heart failure), NYHA class III (HCC)    s/p heart transplant  . Chronic kidney disease    end stage  . Chronic systolic dysfunction of left ventricle   . CVA (cerebral infarction)   . Diabetes mellitus    PMH; Prior to heart transplant  . Family history of coronary artery disease    in both parents  . Hyperlipidemia   . Hypertension   . Left bundle branch block   . Morbid obesity ([place])    status post lap band  . Myocardial infarction [facility])    prior to heart transplant  . Nonischemic cardiomyopathy ([place])    prior to heart transplant  . Obesity (BMI 30-39.9)   . Obstructive sleep apnea    no longer needs CPAP after heart transplant per pt  . Premature ventricular contractions   . SOB (shortness of breath)   . Stroke [facility])     Surgeries:  on * No surgery found *   Consultants: Treatment Team:  Nicholes Stairs, MD  Discharged Condition: Improved  Hospital Course: [name] is an 57 y.o. male who was admitted 08/24/2017 for operative treatment of<principal problem not specified>. Patient has severe unremitting pain that affects sleep, daily activities, and work/hobbies. After pre-op clearance the patient was taken to the operating room on * No surgery found * and underwent  .    Patient was given perioperative antibiotics:  Anti-infectives (From admission, onward)   None       Patient was given sequential compression devices, early ambulation, and chemoprophylaxis to prevent DVT.  Patient benefited maximally from  hospital stay and there were no complications.    Recent vital signs: No data found.   Recent laboratory studies: No results for input(s): WBC, HGB, HCT, PLT, NA, K, CL, CO2, BUN, CREATININE, GLUCOSE, INR, CALCIUM in the last 72 hours.  Invalid input(s): PT, 2   Discharge Medications:   Allergies as of 08/25/2017      Reactions   Heparin Nausea Only, Swelling, Other (See Comments)    * * HIT * * SWELLING REACTION UNSPECIFIED  DIAPHORESIS   Penicillin G Potassium [penicillin G] Nausea Only, Other (See Comments)   DIAPHORESIS      Medication List    TAKE these medications   aspirin 81 MG chewable tablet Chew 1 tablet (81 mg total) by mouth 2 (two) times daily.   cinacalcet 30 MG tablet Commonly known as:  SENSIPAR Take 30 mg by mouth daily.   citalopram 20 MG tablet Commonly known as:  CELEXA TAKE 1 TABLET(20 MG) BY MOUTH EVERY DAY   cycloSPORINE modified 100 MG capsule Commonly known as:  NEORAL Take 100 mg by mouth See admin instructions. Take one capsule (100 mg) by mouth twice daily with 3 capsules (75 mg) for a total dose of 175 mg twice daily (both modified) What changed:  Another medication with the same name was changed. Make sure you understand how and when to take each.   cycloSPORINE modified 25 MG capsule Commonly known as:  NEORAL  Take 3 capsules (75 mg total) by mouth 2 (two) times daily. In combination with What changed:    when to take this  additional instructions   docusate sodium 100 MG capsule Commonly known as:  COLACE Take 1 capsule (100 mg total) by mouth 2 (two) times daily. What changed:    when to take this  reasons to take this   lidocaine 5 % ointment Commonly known as:  XYLOCAINE Apply 1 application topically as needed. Apply to feet twice daily as needed.   mycophenolate 360 MG Tbec EC tablet Commonly known as:  MYFORTIC Take 720 mg by mouth 2 (two) times daily.   NASCOBAL 500 MCG/0.1ML Soln Generic drug:   Cyanocobalamin Place 500 mcg into the nose daily as needed (energy boost).   omeprazole 20 MG capsule Commonly known as:  PRILOSEC TAKE 20 mg BY MOUTH DAILY   ondansetron 4 MG tablet Commonly known as:  ZOFRAN Take 1 tablet (4 mg total) by mouth every 6 (six) hours as needed for nausea.   oxyCODONE 5 MG immediate release tablet Commonly known as:  Oxy IR/ROXICODONE Take 1-2 tablets (5-10 mg total) by mouth every 6 (six) hours as needed for breakthrough pain.   pravastatin 40 MG tablet Commonly known as:  PRAVACHOL Take 1 tablet (40 mg total) by mouth daily at 6 PM. What changed:  when to take this   pregabalin 100 MG capsule Commonly known as:  LYRICA Take 100 mg by mouth 3 (three) times daily.   RENO CAPS 1 MG Caps Take 1 capsule by mouth daily.   senna 8.6 MG Tabs tablet Commonly known as:  SENOKOT Take 2 tablets (17.2 mg total) by mouth at bedtime.   sevelamer carbonate 800 MG tablet Commonly known as:  RENVELA Take 800-2,400 mg by mouth See admin instructions. Take 2-3 tablets (1600-2400 mg) by mouth three times daily with meals, take 1 tablet (800 mg) with snacks   tadalafil 20 MG tablet Commonly known as:  CIALIS Take 20 mg by mouth daily as needed for erectile dysfunction.   venlafaxine 37.5 MG tablet Commonly known as:  EFFEXOR Take half tablet x 1 month, then increase to 1 tablet daily.  On days of dialysis, take afterwards.       Diagnostic Studies: Dg Hip Port Unilat W Or Wo Pelvis 1 View Right  Result Date: 08/24/2017 CLINICAL DATA:  Post reduction EXAM: DG HIP (WITH OR WITHOUT PELVIS) 1V PORT RIGHT COMPARISON:  08/24/2017 FINDINGS: Satisfactory reduction of right hip dislocation. Right hip prosthesis in satisfactory position alignment. No acute fracture IMPRESSION: Satisfactory right hip reduction. Electronically Signed   By: Franchot Gallo M.D.   On: 08/24/2017 20:05   Dg Hip Unilat With Pelvis 2-3 Views Right  Result Date: 08/24/2017 CLINICAL DATA:   Right hip pain and deformity. EXAM: DG HIP (WITH OR WITHOUT PELVIS) 2-3V RIGHT COMPARISON:  07/22/2017 FINDINGS: A right hip total arthroplasty device is identified. There is pin posterior and superior dislocation of the hip joint. No periprosthetic fracture identified. IMPRESSION: 1. Posterior dislocation of right hip Electronically Signed   By: Kerby Moors M.D.   On: 08/24/2017 16:57    Disposition: 01-Home or Self Care  Discharge Instructions    Call MD / Call 911   Complete by:  As directed    If you experience chest pain or shortness of breath, CALL 911 and be transported to the hospital emergency room.  If you develope a fever above 101 F, pus (white  drainage) or increased drainage or redness at the wound, or calf pain, call your surgeon's office.   Constipation Prevention   Complete by:  As directed    Drink plenty of fluids.  Prune juice may be helpful.  You may use a stool softener, such as Colace (over the counter) 100 mg twice a day.  Use MiraLax (over the counter) for constipation as needed.   Diet - low sodium heart healthy   Complete by:  As directed    Increase activity slowly as tolerated   Complete by:  As directed          Signed: Lacie Draft M. 08/28/2017, 6:04 AM

## 2017-08-30 DIAGNOSIS — D509 Iron deficiency anemia, unspecified: Secondary | ICD-10-CM | POA: Diagnosis not present

## 2017-08-30 DIAGNOSIS — E1129 Type 2 diabetes mellitus with other diabetic kidney complication: Secondary | ICD-10-CM | POA: Diagnosis not present

## 2017-08-30 DIAGNOSIS — N2581 Secondary hyperparathyroidism of renal origin: Secondary | ICD-10-CM | POA: Diagnosis not present

## 2017-08-30 DIAGNOSIS — N186 End stage renal disease: Secondary | ICD-10-CM | POA: Diagnosis not present

## 2017-08-30 DIAGNOSIS — D631 Anemia in chronic kidney disease: Secondary | ICD-10-CM | POA: Diagnosis not present

## 2017-09-02 DIAGNOSIS — D631 Anemia in chronic kidney disease: Secondary | ICD-10-CM | POA: Diagnosis not present

## 2017-09-02 DIAGNOSIS — E1129 Type 2 diabetes mellitus with other diabetic kidney complication: Secondary | ICD-10-CM | POA: Diagnosis not present

## 2017-09-02 DIAGNOSIS — N186 End stage renal disease: Secondary | ICD-10-CM | POA: Diagnosis not present

## 2017-09-02 DIAGNOSIS — D509 Iron deficiency anemia, unspecified: Secondary | ICD-10-CM | POA: Diagnosis not present

## 2017-09-02 DIAGNOSIS — N2581 Secondary hyperparathyroidism of renal origin: Secondary | ICD-10-CM | POA: Diagnosis not present

## 2017-09-04 DIAGNOSIS — E1129 Type 2 diabetes mellitus with other diabetic kidney complication: Secondary | ICD-10-CM | POA: Diagnosis not present

## 2017-09-04 DIAGNOSIS — D631 Anemia in chronic kidney disease: Secondary | ICD-10-CM | POA: Diagnosis not present

## 2017-09-04 DIAGNOSIS — N186 End stage renal disease: Secondary | ICD-10-CM | POA: Diagnosis not present

## 2017-09-04 DIAGNOSIS — D509 Iron deficiency anemia, unspecified: Secondary | ICD-10-CM | POA: Diagnosis not present

## 2017-09-04 DIAGNOSIS — N2581 Secondary hyperparathyroidism of renal origin: Secondary | ICD-10-CM | POA: Diagnosis not present

## 2017-09-06 DIAGNOSIS — E1129 Type 2 diabetes mellitus with other diabetic kidney complication: Secondary | ICD-10-CM | POA: Diagnosis not present

## 2017-09-06 DIAGNOSIS — D509 Iron deficiency anemia, unspecified: Secondary | ICD-10-CM | POA: Diagnosis not present

## 2017-09-06 DIAGNOSIS — N2581 Secondary hyperparathyroidism of renal origin: Secondary | ICD-10-CM | POA: Diagnosis not present

## 2017-09-06 DIAGNOSIS — Z96641 Presence of right artificial hip joint: Secondary | ICD-10-CM | POA: Diagnosis not present

## 2017-09-06 DIAGNOSIS — Z471 Aftercare following joint replacement surgery: Secondary | ICD-10-CM | POA: Diagnosis not present

## 2017-09-06 DIAGNOSIS — D631 Anemia in chronic kidney disease: Secondary | ICD-10-CM | POA: Diagnosis not present

## 2017-09-06 DIAGNOSIS — N186 End stage renal disease: Secondary | ICD-10-CM | POA: Diagnosis not present

## 2017-09-09 DIAGNOSIS — N2581 Secondary hyperparathyroidism of renal origin: Secondary | ICD-10-CM | POA: Diagnosis not present

## 2017-09-09 DIAGNOSIS — E1129 Type 2 diabetes mellitus with other diabetic kidney complication: Secondary | ICD-10-CM | POA: Diagnosis not present

## 2017-09-09 DIAGNOSIS — N186 End stage renal disease: Secondary | ICD-10-CM | POA: Diagnosis not present

## 2017-09-09 DIAGNOSIS — D631 Anemia in chronic kidney disease: Secondary | ICD-10-CM | POA: Diagnosis not present

## 2017-09-09 DIAGNOSIS — D509 Iron deficiency anemia, unspecified: Secondary | ICD-10-CM | POA: Diagnosis not present

## 2017-09-11 DIAGNOSIS — N2581 Secondary hyperparathyroidism of renal origin: Secondary | ICD-10-CM | POA: Diagnosis not present

## 2017-09-11 DIAGNOSIS — E1129 Type 2 diabetes mellitus with other diabetic kidney complication: Secondary | ICD-10-CM | POA: Diagnosis not present

## 2017-09-11 DIAGNOSIS — D631 Anemia in chronic kidney disease: Secondary | ICD-10-CM | POA: Diagnosis not present

## 2017-09-11 DIAGNOSIS — N186 End stage renal disease: Secondary | ICD-10-CM | POA: Diagnosis not present

## 2017-09-11 DIAGNOSIS — D509 Iron deficiency anemia, unspecified: Secondary | ICD-10-CM | POA: Diagnosis not present

## 2017-09-13 DIAGNOSIS — D631 Anemia in chronic kidney disease: Secondary | ICD-10-CM | POA: Diagnosis not present

## 2017-09-13 DIAGNOSIS — N186 End stage renal disease: Secondary | ICD-10-CM | POA: Diagnosis not present

## 2017-09-13 DIAGNOSIS — E1129 Type 2 diabetes mellitus with other diabetic kidney complication: Secondary | ICD-10-CM | POA: Diagnosis not present

## 2017-09-13 DIAGNOSIS — D509 Iron deficiency anemia, unspecified: Secondary | ICD-10-CM | POA: Diagnosis not present

## 2017-09-13 DIAGNOSIS — N2581 Secondary hyperparathyroidism of renal origin: Secondary | ICD-10-CM | POA: Diagnosis not present

## 2017-09-16 DIAGNOSIS — E1129 Type 2 diabetes mellitus with other diabetic kidney complication: Secondary | ICD-10-CM | POA: Diagnosis not present

## 2017-09-16 DIAGNOSIS — D509 Iron deficiency anemia, unspecified: Secondary | ICD-10-CM | POA: Diagnosis not present

## 2017-09-16 DIAGNOSIS — D631 Anemia in chronic kidney disease: Secondary | ICD-10-CM | POA: Diagnosis not present

## 2017-09-16 DIAGNOSIS — N2581 Secondary hyperparathyroidism of renal origin: Secondary | ICD-10-CM | POA: Diagnosis not present

## 2017-09-16 DIAGNOSIS — N186 End stage renal disease: Secondary | ICD-10-CM | POA: Diagnosis not present

## 2017-09-18 DIAGNOSIS — E1129 Type 2 diabetes mellitus with other diabetic kidney complication: Secondary | ICD-10-CM | POA: Diagnosis not present

## 2017-09-18 DIAGNOSIS — D631 Anemia in chronic kidney disease: Secondary | ICD-10-CM | POA: Diagnosis not present

## 2017-09-18 DIAGNOSIS — D509 Iron deficiency anemia, unspecified: Secondary | ICD-10-CM | POA: Diagnosis not present

## 2017-09-18 DIAGNOSIS — N186 End stage renal disease: Secondary | ICD-10-CM | POA: Diagnosis not present

## 2017-09-18 DIAGNOSIS — N2581 Secondary hyperparathyroidism of renal origin: Secondary | ICD-10-CM | POA: Diagnosis not present

## 2017-09-20 DIAGNOSIS — D509 Iron deficiency anemia, unspecified: Secondary | ICD-10-CM | POA: Diagnosis not present

## 2017-09-20 DIAGNOSIS — N2581 Secondary hyperparathyroidism of renal origin: Secondary | ICD-10-CM | POA: Diagnosis not present

## 2017-09-20 DIAGNOSIS — D631 Anemia in chronic kidney disease: Secondary | ICD-10-CM | POA: Diagnosis not present

## 2017-09-20 DIAGNOSIS — N186 End stage renal disease: Secondary | ICD-10-CM | POA: Diagnosis not present

## 2017-09-20 DIAGNOSIS — E1129 Type 2 diabetes mellitus with other diabetic kidney complication: Secondary | ICD-10-CM | POA: Diagnosis not present

## 2017-09-23 DIAGNOSIS — D509 Iron deficiency anemia, unspecified: Secondary | ICD-10-CM | POA: Diagnosis not present

## 2017-09-23 DIAGNOSIS — E1129 Type 2 diabetes mellitus with other diabetic kidney complication: Secondary | ICD-10-CM | POA: Diagnosis not present

## 2017-09-23 DIAGNOSIS — D631 Anemia in chronic kidney disease: Secondary | ICD-10-CM | POA: Diagnosis not present

## 2017-09-23 DIAGNOSIS — N186 End stage renal disease: Secondary | ICD-10-CM | POA: Diagnosis not present

## 2017-09-23 DIAGNOSIS — N2581 Secondary hyperparathyroidism of renal origin: Secondary | ICD-10-CM | POA: Diagnosis not present

## 2017-09-25 DIAGNOSIS — N186 End stage renal disease: Secondary | ICD-10-CM | POA: Diagnosis not present

## 2017-09-25 DIAGNOSIS — N2581 Secondary hyperparathyroidism of renal origin: Secondary | ICD-10-CM | POA: Diagnosis not present

## 2017-09-25 DIAGNOSIS — D631 Anemia in chronic kidney disease: Secondary | ICD-10-CM | POA: Diagnosis not present

## 2017-09-25 DIAGNOSIS — E1129 Type 2 diabetes mellitus with other diabetic kidney complication: Secondary | ICD-10-CM | POA: Diagnosis not present

## 2017-09-25 DIAGNOSIS — D509 Iron deficiency anemia, unspecified: Secondary | ICD-10-CM | POA: Diagnosis not present

## 2017-09-26 DIAGNOSIS — T8620 Unspecified complication of heart transplant: Secondary | ICD-10-CM | POA: Diagnosis not present

## 2017-09-26 DIAGNOSIS — Z992 Dependence on renal dialysis: Secondary | ICD-10-CM | POA: Diagnosis not present

## 2017-09-26 DIAGNOSIS — N186 End stage renal disease: Secondary | ICD-10-CM | POA: Diagnosis not present

## 2017-09-27 DIAGNOSIS — D509 Iron deficiency anemia, unspecified: Secondary | ICD-10-CM | POA: Diagnosis not present

## 2017-09-27 DIAGNOSIS — N2581 Secondary hyperparathyroidism of renal origin: Secondary | ICD-10-CM | POA: Diagnosis not present

## 2017-09-27 DIAGNOSIS — D631 Anemia in chronic kidney disease: Secondary | ICD-10-CM | POA: Diagnosis not present

## 2017-09-27 DIAGNOSIS — E1129 Type 2 diabetes mellitus with other diabetic kidney complication: Secondary | ICD-10-CM | POA: Diagnosis not present

## 2017-09-27 DIAGNOSIS — Z992 Dependence on renal dialysis: Secondary | ICD-10-CM | POA: Diagnosis not present

## 2017-09-27 DIAGNOSIS — T8620 Unspecified complication of heart transplant: Secondary | ICD-10-CM | POA: Diagnosis not present

## 2017-09-27 DIAGNOSIS — N186 End stage renal disease: Secondary | ICD-10-CM | POA: Diagnosis not present

## 2017-09-30 DIAGNOSIS — D509 Iron deficiency anemia, unspecified: Secondary | ICD-10-CM | POA: Diagnosis not present

## 2017-09-30 DIAGNOSIS — N2581 Secondary hyperparathyroidism of renal origin: Secondary | ICD-10-CM | POA: Diagnosis not present

## 2017-09-30 DIAGNOSIS — D631 Anemia in chronic kidney disease: Secondary | ICD-10-CM | POA: Diagnosis not present

## 2017-09-30 DIAGNOSIS — Z992 Dependence on renal dialysis: Secondary | ICD-10-CM | POA: Diagnosis not present

## 2017-09-30 DIAGNOSIS — N186 End stage renal disease: Secondary | ICD-10-CM | POA: Diagnosis not present

## 2017-09-30 DIAGNOSIS — E1129 Type 2 diabetes mellitus with other diabetic kidney complication: Secondary | ICD-10-CM | POA: Diagnosis not present

## 2017-10-02 DIAGNOSIS — D509 Iron deficiency anemia, unspecified: Secondary | ICD-10-CM | POA: Diagnosis not present

## 2017-10-02 DIAGNOSIS — Z992 Dependence on renal dialysis: Secondary | ICD-10-CM | POA: Diagnosis not present

## 2017-10-02 DIAGNOSIS — E1129 Type 2 diabetes mellitus with other diabetic kidney complication: Secondary | ICD-10-CM | POA: Diagnosis not present

## 2017-10-02 DIAGNOSIS — D631 Anemia in chronic kidney disease: Secondary | ICD-10-CM | POA: Diagnosis not present

## 2017-10-02 DIAGNOSIS — N2581 Secondary hyperparathyroidism of renal origin: Secondary | ICD-10-CM | POA: Diagnosis not present

## 2017-10-02 DIAGNOSIS — N186 End stage renal disease: Secondary | ICD-10-CM | POA: Diagnosis not present

## 2017-10-04 DIAGNOSIS — N186 End stage renal disease: Secondary | ICD-10-CM | POA: Diagnosis not present

## 2017-10-04 DIAGNOSIS — N2581 Secondary hyperparathyroidism of renal origin: Secondary | ICD-10-CM | POA: Diagnosis not present

## 2017-10-04 DIAGNOSIS — D509 Iron deficiency anemia, unspecified: Secondary | ICD-10-CM | POA: Diagnosis not present

## 2017-10-04 DIAGNOSIS — Z992 Dependence on renal dialysis: Secondary | ICD-10-CM | POA: Diagnosis not present

## 2017-10-04 DIAGNOSIS — D631 Anemia in chronic kidney disease: Secondary | ICD-10-CM | POA: Diagnosis not present

## 2017-10-04 DIAGNOSIS — E1129 Type 2 diabetes mellitus with other diabetic kidney complication: Secondary | ICD-10-CM | POA: Diagnosis not present

## 2017-10-07 DIAGNOSIS — Z992 Dependence on renal dialysis: Secondary | ICD-10-CM | POA: Diagnosis not present

## 2017-10-07 DIAGNOSIS — N186 End stage renal disease: Secondary | ICD-10-CM | POA: Diagnosis not present

## 2017-10-07 DIAGNOSIS — N2581 Secondary hyperparathyroidism of renal origin: Secondary | ICD-10-CM | POA: Diagnosis not present

## 2017-10-07 DIAGNOSIS — D631 Anemia in chronic kidney disease: Secondary | ICD-10-CM | POA: Diagnosis not present

## 2017-10-07 DIAGNOSIS — D509 Iron deficiency anemia, unspecified: Secondary | ICD-10-CM | POA: Diagnosis not present

## 2017-10-07 DIAGNOSIS — E1129 Type 2 diabetes mellitus with other diabetic kidney complication: Secondary | ICD-10-CM | POA: Diagnosis not present

## 2017-10-09 DIAGNOSIS — E1129 Type 2 diabetes mellitus with other diabetic kidney complication: Secondary | ICD-10-CM | POA: Diagnosis not present

## 2017-10-09 DIAGNOSIS — D631 Anemia in chronic kidney disease: Secondary | ICD-10-CM | POA: Diagnosis not present

## 2017-10-09 DIAGNOSIS — D509 Iron deficiency anemia, unspecified: Secondary | ICD-10-CM | POA: Diagnosis not present

## 2017-10-09 DIAGNOSIS — Z992 Dependence on renal dialysis: Secondary | ICD-10-CM | POA: Diagnosis not present

## 2017-10-09 DIAGNOSIS — N2581 Secondary hyperparathyroidism of renal origin: Secondary | ICD-10-CM | POA: Diagnosis not present

## 2017-10-09 DIAGNOSIS — N186 End stage renal disease: Secondary | ICD-10-CM | POA: Diagnosis not present

## 2017-10-11 DIAGNOSIS — N186 End stage renal disease: Secondary | ICD-10-CM | POA: Diagnosis not present

## 2017-10-11 DIAGNOSIS — D631 Anemia in chronic kidney disease: Secondary | ICD-10-CM | POA: Diagnosis not present

## 2017-10-11 DIAGNOSIS — E1129 Type 2 diabetes mellitus with other diabetic kidney complication: Secondary | ICD-10-CM | POA: Diagnosis not present

## 2017-10-11 DIAGNOSIS — N2581 Secondary hyperparathyroidism of renal origin: Secondary | ICD-10-CM | POA: Diagnosis not present

## 2017-10-11 DIAGNOSIS — Z992 Dependence on renal dialysis: Secondary | ICD-10-CM | POA: Diagnosis not present

## 2017-10-11 DIAGNOSIS — D509 Iron deficiency anemia, unspecified: Secondary | ICD-10-CM | POA: Diagnosis not present

## 2017-10-14 DIAGNOSIS — Z992 Dependence on renal dialysis: Secondary | ICD-10-CM | POA: Diagnosis not present

## 2017-10-14 DIAGNOSIS — D509 Iron deficiency anemia, unspecified: Secondary | ICD-10-CM | POA: Diagnosis not present

## 2017-10-14 DIAGNOSIS — E1129 Type 2 diabetes mellitus with other diabetic kidney complication: Secondary | ICD-10-CM | POA: Diagnosis not present

## 2017-10-14 DIAGNOSIS — N186 End stage renal disease: Secondary | ICD-10-CM | POA: Diagnosis not present

## 2017-10-14 DIAGNOSIS — D631 Anemia in chronic kidney disease: Secondary | ICD-10-CM | POA: Diagnosis not present

## 2017-10-14 DIAGNOSIS — N2581 Secondary hyperparathyroidism of renal origin: Secondary | ICD-10-CM | POA: Diagnosis not present

## 2017-10-16 DIAGNOSIS — E1129 Type 2 diabetes mellitus with other diabetic kidney complication: Secondary | ICD-10-CM | POA: Diagnosis not present

## 2017-10-16 DIAGNOSIS — N2581 Secondary hyperparathyroidism of renal origin: Secondary | ICD-10-CM | POA: Diagnosis not present

## 2017-10-16 DIAGNOSIS — D631 Anemia in chronic kidney disease: Secondary | ICD-10-CM | POA: Diagnosis not present

## 2017-10-16 DIAGNOSIS — D509 Iron deficiency anemia, unspecified: Secondary | ICD-10-CM | POA: Diagnosis not present

## 2017-10-16 DIAGNOSIS — Z992 Dependence on renal dialysis: Secondary | ICD-10-CM | POA: Diagnosis not present

## 2017-10-16 DIAGNOSIS — N186 End stage renal disease: Secondary | ICD-10-CM | POA: Diagnosis not present

## 2017-10-18 DIAGNOSIS — E1129 Type 2 diabetes mellitus with other diabetic kidney complication: Secondary | ICD-10-CM | POA: Diagnosis not present

## 2017-10-18 DIAGNOSIS — N186 End stage renal disease: Secondary | ICD-10-CM | POA: Diagnosis not present

## 2017-10-18 DIAGNOSIS — D631 Anemia in chronic kidney disease: Secondary | ICD-10-CM | POA: Diagnosis not present

## 2017-10-18 DIAGNOSIS — N2581 Secondary hyperparathyroidism of renal origin: Secondary | ICD-10-CM | POA: Diagnosis not present

## 2017-10-18 DIAGNOSIS — Z992 Dependence on renal dialysis: Secondary | ICD-10-CM | POA: Diagnosis not present

## 2017-10-18 DIAGNOSIS — D509 Iron deficiency anemia, unspecified: Secondary | ICD-10-CM | POA: Diagnosis not present

## 2017-10-21 DIAGNOSIS — E1129 Type 2 diabetes mellitus with other diabetic kidney complication: Secondary | ICD-10-CM | POA: Diagnosis not present

## 2017-10-21 DIAGNOSIS — N2581 Secondary hyperparathyroidism of renal origin: Secondary | ICD-10-CM | POA: Diagnosis not present

## 2017-10-21 DIAGNOSIS — D509 Iron deficiency anemia, unspecified: Secondary | ICD-10-CM | POA: Diagnosis not present

## 2017-10-21 DIAGNOSIS — D631 Anemia in chronic kidney disease: Secondary | ICD-10-CM | POA: Diagnosis not present

## 2017-10-21 DIAGNOSIS — Z992 Dependence on renal dialysis: Secondary | ICD-10-CM | POA: Diagnosis not present

## 2017-10-21 DIAGNOSIS — N186 End stage renal disease: Secondary | ICD-10-CM | POA: Diagnosis not present

## 2017-10-23 DIAGNOSIS — N2581 Secondary hyperparathyroidism of renal origin: Secondary | ICD-10-CM | POA: Diagnosis not present

## 2017-10-23 DIAGNOSIS — Z992 Dependence on renal dialysis: Secondary | ICD-10-CM | POA: Diagnosis not present

## 2017-10-23 DIAGNOSIS — E1129 Type 2 diabetes mellitus with other diabetic kidney complication: Secondary | ICD-10-CM | POA: Diagnosis not present

## 2017-10-23 DIAGNOSIS — D631 Anemia in chronic kidney disease: Secondary | ICD-10-CM | POA: Diagnosis not present

## 2017-10-23 DIAGNOSIS — N186 End stage renal disease: Secondary | ICD-10-CM | POA: Diagnosis not present

## 2017-10-23 DIAGNOSIS — D509 Iron deficiency anemia, unspecified: Secondary | ICD-10-CM | POA: Diagnosis not present

## 2017-10-25 DIAGNOSIS — E1129 Type 2 diabetes mellitus with other diabetic kidney complication: Secondary | ICD-10-CM | POA: Diagnosis not present

## 2017-10-25 DIAGNOSIS — Z992 Dependence on renal dialysis: Secondary | ICD-10-CM | POA: Diagnosis not present

## 2017-10-25 DIAGNOSIS — T8620 Unspecified complication of heart transplant: Secondary | ICD-10-CM | POA: Diagnosis not present

## 2017-10-25 DIAGNOSIS — N2581 Secondary hyperparathyroidism of renal origin: Secondary | ICD-10-CM | POA: Diagnosis not present

## 2017-10-25 DIAGNOSIS — D631 Anemia in chronic kidney disease: Secondary | ICD-10-CM | POA: Diagnosis not present

## 2017-10-25 DIAGNOSIS — D509 Iron deficiency anemia, unspecified: Secondary | ICD-10-CM | POA: Diagnosis not present

## 2017-10-25 DIAGNOSIS — N186 End stage renal disease: Secondary | ICD-10-CM | POA: Diagnosis not present

## 2017-10-28 DIAGNOSIS — N2581 Secondary hyperparathyroidism of renal origin: Secondary | ICD-10-CM | POA: Diagnosis not present

## 2017-10-28 DIAGNOSIS — D631 Anemia in chronic kidney disease: Secondary | ICD-10-CM | POA: Diagnosis not present

## 2017-10-28 DIAGNOSIS — E1129 Type 2 diabetes mellitus with other diabetic kidney complication: Secondary | ICD-10-CM | POA: Diagnosis not present

## 2017-10-28 DIAGNOSIS — D509 Iron deficiency anemia, unspecified: Secondary | ICD-10-CM | POA: Diagnosis not present

## 2017-10-28 DIAGNOSIS — N186 End stage renal disease: Secondary | ICD-10-CM | POA: Diagnosis not present

## 2017-10-28 DIAGNOSIS — Z992 Dependence on renal dialysis: Secondary | ICD-10-CM | POA: Diagnosis not present

## 2017-10-30 DIAGNOSIS — N2581 Secondary hyperparathyroidism of renal origin: Secondary | ICD-10-CM | POA: Diagnosis not present

## 2017-10-30 DIAGNOSIS — D509 Iron deficiency anemia, unspecified: Secondary | ICD-10-CM | POA: Diagnosis not present

## 2017-10-30 DIAGNOSIS — N186 End stage renal disease: Secondary | ICD-10-CM | POA: Diagnosis not present

## 2017-10-30 DIAGNOSIS — E1129 Type 2 diabetes mellitus with other diabetic kidney complication: Secondary | ICD-10-CM | POA: Diagnosis not present

## 2017-10-30 DIAGNOSIS — Z992 Dependence on renal dialysis: Secondary | ICD-10-CM | POA: Diagnosis not present

## 2017-10-30 DIAGNOSIS — D631 Anemia in chronic kidney disease: Secondary | ICD-10-CM | POA: Diagnosis not present

## 2017-11-01 DIAGNOSIS — D509 Iron deficiency anemia, unspecified: Secondary | ICD-10-CM | POA: Diagnosis not present

## 2017-11-01 DIAGNOSIS — N2581 Secondary hyperparathyroidism of renal origin: Secondary | ICD-10-CM | POA: Diagnosis not present

## 2017-11-01 DIAGNOSIS — N186 End stage renal disease: Secondary | ICD-10-CM | POA: Diagnosis not present

## 2017-11-01 DIAGNOSIS — Z992 Dependence on renal dialysis: Secondary | ICD-10-CM | POA: Diagnosis not present

## 2017-11-01 DIAGNOSIS — D631 Anemia in chronic kidney disease: Secondary | ICD-10-CM | POA: Diagnosis not present

## 2017-11-01 DIAGNOSIS — E1129 Type 2 diabetes mellitus with other diabetic kidney complication: Secondary | ICD-10-CM | POA: Diagnosis not present

## 2017-11-04 DIAGNOSIS — D509 Iron deficiency anemia, unspecified: Secondary | ICD-10-CM | POA: Diagnosis not present

## 2017-11-04 DIAGNOSIS — N2581 Secondary hyperparathyroidism of renal origin: Secondary | ICD-10-CM | POA: Diagnosis not present

## 2017-11-04 DIAGNOSIS — Z992 Dependence on renal dialysis: Secondary | ICD-10-CM | POA: Diagnosis not present

## 2017-11-04 DIAGNOSIS — D631 Anemia in chronic kidney disease: Secondary | ICD-10-CM | POA: Diagnosis not present

## 2017-11-04 DIAGNOSIS — N186 End stage renal disease: Secondary | ICD-10-CM | POA: Diagnosis not present

## 2017-11-04 DIAGNOSIS — E1129 Type 2 diabetes mellitus with other diabetic kidney complication: Secondary | ICD-10-CM | POA: Diagnosis not present

## 2017-11-06 DIAGNOSIS — E1129 Type 2 diabetes mellitus with other diabetic kidney complication: Secondary | ICD-10-CM | POA: Diagnosis not present

## 2017-11-06 DIAGNOSIS — D509 Iron deficiency anemia, unspecified: Secondary | ICD-10-CM | POA: Diagnosis not present

## 2017-11-06 DIAGNOSIS — D631 Anemia in chronic kidney disease: Secondary | ICD-10-CM | POA: Diagnosis not present

## 2017-11-06 DIAGNOSIS — N2581 Secondary hyperparathyroidism of renal origin: Secondary | ICD-10-CM | POA: Diagnosis not present

## 2017-11-06 DIAGNOSIS — N186 End stage renal disease: Secondary | ICD-10-CM | POA: Diagnosis not present

## 2017-11-06 DIAGNOSIS — Z992 Dependence on renal dialysis: Secondary | ICD-10-CM | POA: Diagnosis not present

## 2017-11-08 DIAGNOSIS — N186 End stage renal disease: Secondary | ICD-10-CM | POA: Diagnosis not present

## 2017-11-08 DIAGNOSIS — Z992 Dependence on renal dialysis: Secondary | ICD-10-CM | POA: Diagnosis not present

## 2017-11-08 DIAGNOSIS — N2581 Secondary hyperparathyroidism of renal origin: Secondary | ICD-10-CM | POA: Diagnosis not present

## 2017-11-08 DIAGNOSIS — D509 Iron deficiency anemia, unspecified: Secondary | ICD-10-CM | POA: Diagnosis not present

## 2017-11-08 DIAGNOSIS — E1129 Type 2 diabetes mellitus with other diabetic kidney complication: Secondary | ICD-10-CM | POA: Diagnosis not present

## 2017-11-08 DIAGNOSIS — D631 Anemia in chronic kidney disease: Secondary | ICD-10-CM | POA: Diagnosis not present

## 2017-11-11 DIAGNOSIS — D509 Iron deficiency anemia, unspecified: Secondary | ICD-10-CM | POA: Diagnosis not present

## 2017-11-11 DIAGNOSIS — N186 End stage renal disease: Secondary | ICD-10-CM | POA: Diagnosis not present

## 2017-11-11 DIAGNOSIS — N2581 Secondary hyperparathyroidism of renal origin: Secondary | ICD-10-CM | POA: Diagnosis not present

## 2017-11-11 DIAGNOSIS — Z992 Dependence on renal dialysis: Secondary | ICD-10-CM | POA: Diagnosis not present

## 2017-11-11 DIAGNOSIS — E1129 Type 2 diabetes mellitus with other diabetic kidney complication: Secondary | ICD-10-CM | POA: Diagnosis not present

## 2017-11-11 DIAGNOSIS — D631 Anemia in chronic kidney disease: Secondary | ICD-10-CM | POA: Diagnosis not present

## 2017-11-13 DIAGNOSIS — N2581 Secondary hyperparathyroidism of renal origin: Secondary | ICD-10-CM | POA: Diagnosis not present

## 2017-11-13 DIAGNOSIS — E1129 Type 2 diabetes mellitus with other diabetic kidney complication: Secondary | ICD-10-CM | POA: Diagnosis not present

## 2017-11-13 DIAGNOSIS — D631 Anemia in chronic kidney disease: Secondary | ICD-10-CM | POA: Diagnosis not present

## 2017-11-13 DIAGNOSIS — N186 End stage renal disease: Secondary | ICD-10-CM | POA: Diagnosis not present

## 2017-11-13 DIAGNOSIS — D509 Iron deficiency anemia, unspecified: Secondary | ICD-10-CM | POA: Diagnosis not present

## 2017-11-13 DIAGNOSIS — Z992 Dependence on renal dialysis: Secondary | ICD-10-CM | POA: Diagnosis not present

## 2017-11-15 DIAGNOSIS — N186 End stage renal disease: Secondary | ICD-10-CM | POA: Diagnosis not present

## 2017-11-15 DIAGNOSIS — N2581 Secondary hyperparathyroidism of renal origin: Secondary | ICD-10-CM | POA: Diagnosis not present

## 2017-11-15 DIAGNOSIS — E1129 Type 2 diabetes mellitus with other diabetic kidney complication: Secondary | ICD-10-CM | POA: Diagnosis not present

## 2017-11-15 DIAGNOSIS — Z992 Dependence on renal dialysis: Secondary | ICD-10-CM | POA: Diagnosis not present

## 2017-11-15 DIAGNOSIS — D631 Anemia in chronic kidney disease: Secondary | ICD-10-CM | POA: Diagnosis not present

## 2017-11-15 DIAGNOSIS — D509 Iron deficiency anemia, unspecified: Secondary | ICD-10-CM | POA: Diagnosis not present

## 2017-11-18 DIAGNOSIS — E1129 Type 2 diabetes mellitus with other diabetic kidney complication: Secondary | ICD-10-CM | POA: Diagnosis not present

## 2017-11-18 DIAGNOSIS — N186 End stage renal disease: Secondary | ICD-10-CM | POA: Diagnosis not present

## 2017-11-18 DIAGNOSIS — N2581 Secondary hyperparathyroidism of renal origin: Secondary | ICD-10-CM | POA: Diagnosis not present

## 2017-11-18 DIAGNOSIS — D509 Iron deficiency anemia, unspecified: Secondary | ICD-10-CM | POA: Diagnosis not present

## 2017-11-18 DIAGNOSIS — Z992 Dependence on renal dialysis: Secondary | ICD-10-CM | POA: Diagnosis not present

## 2017-11-18 DIAGNOSIS — D631 Anemia in chronic kidney disease: Secondary | ICD-10-CM | POA: Diagnosis not present

## 2017-11-19 DIAGNOSIS — I340 Nonrheumatic mitral (valve) insufficiency: Secondary | ICD-10-CM | POA: Diagnosis not present

## 2017-11-19 DIAGNOSIS — Z96642 Presence of left artificial hip joint: Secondary | ICD-10-CM | POA: Diagnosis not present

## 2017-11-19 DIAGNOSIS — Z992 Dependence on renal dialysis: Secondary | ICD-10-CM | POA: Diagnosis not present

## 2017-11-19 DIAGNOSIS — I371 Nonrheumatic pulmonary valve insufficiency: Secondary | ICD-10-CM | POA: Diagnosis not present

## 2017-11-19 DIAGNOSIS — Z125 Encounter for screening for malignant neoplasm of prostate: Secondary | ICD-10-CM | POA: Diagnosis not present

## 2017-11-19 DIAGNOSIS — N186 End stage renal disease: Secondary | ICD-10-CM | POA: Diagnosis not present

## 2017-11-19 DIAGNOSIS — I517 Cardiomegaly: Secondary | ICD-10-CM | POA: Diagnosis not present

## 2017-11-19 DIAGNOSIS — G629 Polyneuropathy, unspecified: Secondary | ICD-10-CM | POA: Diagnosis not present

## 2017-11-19 DIAGNOSIS — Z79899 Other long term (current) drug therapy: Secondary | ICD-10-CM | POA: Diagnosis not present

## 2017-11-19 DIAGNOSIS — I1311 Hypertensive heart and chronic kidney disease without heart failure, with stage 5 chronic kidney disease, or end stage renal disease: Secondary | ICD-10-CM | POA: Diagnosis not present

## 2017-11-19 DIAGNOSIS — Z941 Heart transplant status: Secondary | ICD-10-CM | POA: Diagnosis not present

## 2017-11-19 DIAGNOSIS — Z4821 Encounter for aftercare following heart transplant: Secondary | ICD-10-CM | POA: Diagnosis not present

## 2017-11-20 DIAGNOSIS — Z992 Dependence on renal dialysis: Secondary | ICD-10-CM | POA: Diagnosis not present

## 2017-11-20 DIAGNOSIS — E1129 Type 2 diabetes mellitus with other diabetic kidney complication: Secondary | ICD-10-CM | POA: Diagnosis not present

## 2017-11-20 DIAGNOSIS — N186 End stage renal disease: Secondary | ICD-10-CM | POA: Diagnosis not present

## 2017-11-20 DIAGNOSIS — D631 Anemia in chronic kidney disease: Secondary | ICD-10-CM | POA: Diagnosis not present

## 2017-11-20 DIAGNOSIS — N2581 Secondary hyperparathyroidism of renal origin: Secondary | ICD-10-CM | POA: Diagnosis not present

## 2017-11-20 DIAGNOSIS — D509 Iron deficiency anemia, unspecified: Secondary | ICD-10-CM | POA: Diagnosis not present

## 2017-11-22 DIAGNOSIS — D509 Iron deficiency anemia, unspecified: Secondary | ICD-10-CM | POA: Diagnosis not present

## 2017-11-22 DIAGNOSIS — Z992 Dependence on renal dialysis: Secondary | ICD-10-CM | POA: Diagnosis not present

## 2017-11-22 DIAGNOSIS — N186 End stage renal disease: Secondary | ICD-10-CM | POA: Diagnosis not present

## 2017-11-22 DIAGNOSIS — N2581 Secondary hyperparathyroidism of renal origin: Secondary | ICD-10-CM | POA: Diagnosis not present

## 2017-11-22 DIAGNOSIS — D631 Anemia in chronic kidney disease: Secondary | ICD-10-CM | POA: Diagnosis not present

## 2017-11-22 DIAGNOSIS — E1129 Type 2 diabetes mellitus with other diabetic kidney complication: Secondary | ICD-10-CM | POA: Diagnosis not present

## 2017-11-25 DIAGNOSIS — N186 End stage renal disease: Secondary | ICD-10-CM | POA: Diagnosis not present

## 2017-11-25 DIAGNOSIS — T8620 Unspecified complication of heart transplant: Secondary | ICD-10-CM | POA: Diagnosis not present

## 2017-11-25 DIAGNOSIS — Z992 Dependence on renal dialysis: Secondary | ICD-10-CM | POA: Diagnosis not present

## 2017-11-25 DIAGNOSIS — D631 Anemia in chronic kidney disease: Secondary | ICD-10-CM | POA: Diagnosis not present

## 2017-11-25 DIAGNOSIS — N2581 Secondary hyperparathyroidism of renal origin: Secondary | ICD-10-CM | POA: Diagnosis not present

## 2017-11-25 DIAGNOSIS — E1129 Type 2 diabetes mellitus with other diabetic kidney complication: Secondary | ICD-10-CM | POA: Diagnosis not present

## 2017-11-27 DIAGNOSIS — N2581 Secondary hyperparathyroidism of renal origin: Secondary | ICD-10-CM | POA: Diagnosis not present

## 2017-11-27 DIAGNOSIS — E1129 Type 2 diabetes mellitus with other diabetic kidney complication: Secondary | ICD-10-CM | POA: Diagnosis not present

## 2017-11-27 DIAGNOSIS — N186 End stage renal disease: Secondary | ICD-10-CM | POA: Diagnosis not present

## 2017-11-27 DIAGNOSIS — D631 Anemia in chronic kidney disease: Secondary | ICD-10-CM | POA: Diagnosis not present

## 2017-11-27 DIAGNOSIS — Z992 Dependence on renal dialysis: Secondary | ICD-10-CM | POA: Diagnosis not present

## 2017-11-29 DIAGNOSIS — Z992 Dependence on renal dialysis: Secondary | ICD-10-CM | POA: Diagnosis not present

## 2017-11-29 DIAGNOSIS — D631 Anemia in chronic kidney disease: Secondary | ICD-10-CM | POA: Diagnosis not present

## 2017-11-29 DIAGNOSIS — N2581 Secondary hyperparathyroidism of renal origin: Secondary | ICD-10-CM | POA: Diagnosis not present

## 2017-11-29 DIAGNOSIS — E1129 Type 2 diabetes mellitus with other diabetic kidney complication: Secondary | ICD-10-CM | POA: Diagnosis not present

## 2017-11-29 DIAGNOSIS — N186 End stage renal disease: Secondary | ICD-10-CM | POA: Diagnosis not present

## 2017-12-02 DIAGNOSIS — N186 End stage renal disease: Secondary | ICD-10-CM | POA: Diagnosis not present

## 2017-12-02 DIAGNOSIS — N2581 Secondary hyperparathyroidism of renal origin: Secondary | ICD-10-CM | POA: Diagnosis not present

## 2017-12-02 DIAGNOSIS — E1129 Type 2 diabetes mellitus with other diabetic kidney complication: Secondary | ICD-10-CM | POA: Diagnosis not present

## 2017-12-02 DIAGNOSIS — D631 Anemia in chronic kidney disease: Secondary | ICD-10-CM | POA: Diagnosis not present

## 2017-12-02 DIAGNOSIS — Z992 Dependence on renal dialysis: Secondary | ICD-10-CM | POA: Diagnosis not present

## 2017-12-03 DIAGNOSIS — N5201 Erectile dysfunction due to arterial insufficiency: Secondary | ICD-10-CM | POA: Diagnosis not present

## 2017-12-03 DIAGNOSIS — G629 Polyneuropathy, unspecified: Secondary | ICD-10-CM | POA: Diagnosis not present

## 2017-12-04 DIAGNOSIS — N2581 Secondary hyperparathyroidism of renal origin: Secondary | ICD-10-CM | POA: Diagnosis not present

## 2017-12-04 DIAGNOSIS — E1129 Type 2 diabetes mellitus with other diabetic kidney complication: Secondary | ICD-10-CM | POA: Diagnosis not present

## 2017-12-04 DIAGNOSIS — Z992 Dependence on renal dialysis: Secondary | ICD-10-CM | POA: Diagnosis not present

## 2017-12-04 DIAGNOSIS — D631 Anemia in chronic kidney disease: Secondary | ICD-10-CM | POA: Diagnosis not present

## 2017-12-04 DIAGNOSIS — N186 End stage renal disease: Secondary | ICD-10-CM | POA: Diagnosis not present

## 2017-12-06 DIAGNOSIS — D631 Anemia in chronic kidney disease: Secondary | ICD-10-CM | POA: Diagnosis not present

## 2017-12-06 DIAGNOSIS — N2581 Secondary hyperparathyroidism of renal origin: Secondary | ICD-10-CM | POA: Diagnosis not present

## 2017-12-06 DIAGNOSIS — E1129 Type 2 diabetes mellitus with other diabetic kidney complication: Secondary | ICD-10-CM | POA: Diagnosis not present

## 2017-12-06 DIAGNOSIS — N186 End stage renal disease: Secondary | ICD-10-CM | POA: Diagnosis not present

## 2017-12-06 DIAGNOSIS — Z992 Dependence on renal dialysis: Secondary | ICD-10-CM | POA: Diagnosis not present

## 2017-12-09 DIAGNOSIS — E1129 Type 2 diabetes mellitus with other diabetic kidney complication: Secondary | ICD-10-CM | POA: Diagnosis not present

## 2017-12-09 DIAGNOSIS — Z992 Dependence on renal dialysis: Secondary | ICD-10-CM | POA: Diagnosis not present

## 2017-12-09 DIAGNOSIS — N186 End stage renal disease: Secondary | ICD-10-CM | POA: Diagnosis not present

## 2017-12-09 DIAGNOSIS — D631 Anemia in chronic kidney disease: Secondary | ICD-10-CM | POA: Diagnosis not present

## 2017-12-09 DIAGNOSIS — N2581 Secondary hyperparathyroidism of renal origin: Secondary | ICD-10-CM | POA: Diagnosis not present

## 2017-12-11 DIAGNOSIS — N186 End stage renal disease: Secondary | ICD-10-CM | POA: Diagnosis not present

## 2017-12-11 DIAGNOSIS — Z992 Dependence on renal dialysis: Secondary | ICD-10-CM | POA: Diagnosis not present

## 2017-12-11 DIAGNOSIS — D631 Anemia in chronic kidney disease: Secondary | ICD-10-CM | POA: Diagnosis not present

## 2017-12-11 DIAGNOSIS — N2581 Secondary hyperparathyroidism of renal origin: Secondary | ICD-10-CM | POA: Diagnosis not present

## 2017-12-11 DIAGNOSIS — E1129 Type 2 diabetes mellitus with other diabetic kidney complication: Secondary | ICD-10-CM | POA: Diagnosis not present

## 2017-12-13 DIAGNOSIS — D631 Anemia in chronic kidney disease: Secondary | ICD-10-CM | POA: Diagnosis not present

## 2017-12-13 DIAGNOSIS — N2581 Secondary hyperparathyroidism of renal origin: Secondary | ICD-10-CM | POA: Diagnosis not present

## 2017-12-13 DIAGNOSIS — Z992 Dependence on renal dialysis: Secondary | ICD-10-CM | POA: Diagnosis not present

## 2017-12-13 DIAGNOSIS — N186 End stage renal disease: Secondary | ICD-10-CM | POA: Diagnosis not present

## 2017-12-13 DIAGNOSIS — E1129 Type 2 diabetes mellitus with other diabetic kidney complication: Secondary | ICD-10-CM | POA: Diagnosis not present

## 2017-12-16 DIAGNOSIS — D631 Anemia in chronic kidney disease: Secondary | ICD-10-CM | POA: Diagnosis not present

## 2017-12-16 DIAGNOSIS — N2581 Secondary hyperparathyroidism of renal origin: Secondary | ICD-10-CM | POA: Diagnosis not present

## 2017-12-16 DIAGNOSIS — E1129 Type 2 diabetes mellitus with other diabetic kidney complication: Secondary | ICD-10-CM | POA: Diagnosis not present

## 2017-12-16 DIAGNOSIS — N186 End stage renal disease: Secondary | ICD-10-CM | POA: Diagnosis not present

## 2017-12-16 DIAGNOSIS — Z992 Dependence on renal dialysis: Secondary | ICD-10-CM | POA: Diagnosis not present

## 2017-12-18 DIAGNOSIS — D631 Anemia in chronic kidney disease: Secondary | ICD-10-CM | POA: Diagnosis not present

## 2017-12-18 DIAGNOSIS — N2581 Secondary hyperparathyroidism of renal origin: Secondary | ICD-10-CM | POA: Diagnosis not present

## 2017-12-18 DIAGNOSIS — N186 End stage renal disease: Secondary | ICD-10-CM | POA: Diagnosis not present

## 2017-12-18 DIAGNOSIS — E1129 Type 2 diabetes mellitus with other diabetic kidney complication: Secondary | ICD-10-CM | POA: Diagnosis not present

## 2017-12-18 DIAGNOSIS — Z992 Dependence on renal dialysis: Secondary | ICD-10-CM | POA: Diagnosis not present

## 2017-12-20 DIAGNOSIS — Z992 Dependence on renal dialysis: Secondary | ICD-10-CM | POA: Diagnosis not present

## 2017-12-20 DIAGNOSIS — N2581 Secondary hyperparathyroidism of renal origin: Secondary | ICD-10-CM | POA: Diagnosis not present

## 2017-12-20 DIAGNOSIS — N186 End stage renal disease: Secondary | ICD-10-CM | POA: Diagnosis not present

## 2017-12-20 DIAGNOSIS — E1129 Type 2 diabetes mellitus with other diabetic kidney complication: Secondary | ICD-10-CM | POA: Diagnosis not present

## 2017-12-20 DIAGNOSIS — D631 Anemia in chronic kidney disease: Secondary | ICD-10-CM | POA: Diagnosis not present

## 2017-12-23 ENCOUNTER — Ambulatory Visit (INDEPENDENT_AMBULATORY_CARE_PROVIDER_SITE_OTHER): Payer: Medicare Other

## 2017-12-23 ENCOUNTER — Other Ambulatory Visit: Payer: Self-pay | Admitting: Podiatry

## 2017-12-23 ENCOUNTER — Ambulatory Visit (INDEPENDENT_AMBULATORY_CARE_PROVIDER_SITE_OTHER): Payer: Medicare Other | Admitting: Podiatry

## 2017-12-23 DIAGNOSIS — N186 End stage renal disease: Secondary | ICD-10-CM | POA: Diagnosis not present

## 2017-12-23 DIAGNOSIS — Z472 Encounter for removal of internal fixation device: Secondary | ICD-10-CM | POA: Diagnosis not present

## 2017-12-23 DIAGNOSIS — Z4889 Encounter for other specified surgical aftercare: Secondary | ICD-10-CM

## 2017-12-23 DIAGNOSIS — G8929 Other chronic pain: Secondary | ICD-10-CM | POA: Diagnosis not present

## 2017-12-23 DIAGNOSIS — E1129 Type 2 diabetes mellitus with other diabetic kidney complication: Secondary | ICD-10-CM | POA: Diagnosis not present

## 2017-12-23 DIAGNOSIS — M79675 Pain in left toe(s): Secondary | ICD-10-CM | POA: Diagnosis not present

## 2017-12-23 DIAGNOSIS — D631 Anemia in chronic kidney disease: Secondary | ICD-10-CM | POA: Diagnosis not present

## 2017-12-23 DIAGNOSIS — N2581 Secondary hyperparathyroidism of renal origin: Secondary | ICD-10-CM | POA: Diagnosis not present

## 2017-12-23 DIAGNOSIS — Z992 Dependence on renal dialysis: Secondary | ICD-10-CM | POA: Diagnosis not present

## 2017-12-23 MED ORDER — CEPHALEXIN 500 MG PO CAPS: 500.0000 mg | ORAL_CAPSULE | Freq: Three times a day (TID) | ORAL | 1 refills | Status: DC

## 2017-12-23 NOTE — Patient Instructions (Signed)

## 2017-12-24 NOTE — Progress Notes (Signed)
Subjective:   Patient ID: Esperanza Sheets, male   DOB: 57 y.o.   MRN: [id number]   HPI Patient presents stating I'm getting trouble with this left foot and I think it might be the screw in the second toe as the toe has becomeaggravating form. States it's doing this over the last couple weeks and it's acting like the other ones did with the screws need to be removed and she's noted some swelling   ROS      Objective:  Physical Exam  Neurovascular status intact with patient found to have thickened second nail left with the possibility for protruding and underneath it and mild swelling of the second toe distal phalanx with no proximal edema erythema or drainage noted     Assessment:  Possibility for abnormal screw formation  second digit left     Plan:  H&P and x-ray reviewed and today I infiltrated the left second digit 60 Milligan times like Marcaine mixture did sterile prep of the toe and using sterile his mentation and sterile dressing I went ahead first remove the nail found the screw to be prominent. Utilizing a combination of screwdriver and pickups and stat I was able to remove the screw in toto I flushed the wound applied sterile dressing and placed on cephalexin 500 mg 3 times a day and instructed on keeping the dressing on for 2-3 days and gave strict instructions of redness or any issues or swelling were to occur that he is to contact me immediately  X-ray indicates that there is no indications of pathology except for screw that is present second digit

## 2017-12-25 DIAGNOSIS — N2581 Secondary hyperparathyroidism of renal origin: Secondary | ICD-10-CM | POA: Diagnosis not present

## 2017-12-25 DIAGNOSIS — Z992 Dependence on renal dialysis: Secondary | ICD-10-CM | POA: Diagnosis not present

## 2017-12-25 DIAGNOSIS — N186 End stage renal disease: Secondary | ICD-10-CM | POA: Diagnosis not present

## 2017-12-25 DIAGNOSIS — E1129 Type 2 diabetes mellitus with other diabetic kidney complication: Secondary | ICD-10-CM | POA: Diagnosis not present

## 2017-12-25 DIAGNOSIS — D631 Anemia in chronic kidney disease: Secondary | ICD-10-CM | POA: Diagnosis not present

## 2017-12-25 DIAGNOSIS — T8620 Unspecified complication of heart transplant: Secondary | ICD-10-CM | POA: Diagnosis not present

## 2017-12-26 ENCOUNTER — Ambulatory Visit: Payer: Medicare Other | Admitting: Podiatry

## 2017-12-27 DIAGNOSIS — N2581 Secondary hyperparathyroidism of renal origin: Secondary | ICD-10-CM | POA: Diagnosis not present

## 2017-12-27 DIAGNOSIS — E1129 Type 2 diabetes mellitus with other diabetic kidney complication: Secondary | ICD-10-CM | POA: Diagnosis not present

## 2017-12-27 DIAGNOSIS — N186 End stage renal disease: Secondary | ICD-10-CM | POA: Diagnosis not present

## 2017-12-27 DIAGNOSIS — Z992 Dependence on renal dialysis: Secondary | ICD-10-CM | POA: Diagnosis not present

## 2017-12-27 DIAGNOSIS — D631 Anemia in chronic kidney disease: Secondary | ICD-10-CM | POA: Diagnosis not present

## 2017-12-30 DIAGNOSIS — Z992 Dependence on renal dialysis: Secondary | ICD-10-CM | POA: Diagnosis not present

## 2017-12-30 DIAGNOSIS — N2581 Secondary hyperparathyroidism of renal origin: Secondary | ICD-10-CM | POA: Diagnosis not present

## 2017-12-30 DIAGNOSIS — E1129 Type 2 diabetes mellitus with other diabetic kidney complication: Secondary | ICD-10-CM | POA: Diagnosis not present

## 2017-12-30 DIAGNOSIS — N186 End stage renal disease: Secondary | ICD-10-CM | POA: Diagnosis not present

## 2017-12-30 DIAGNOSIS — D631 Anemia in chronic kidney disease: Secondary | ICD-10-CM | POA: Diagnosis not present

## 2018-01-01 DIAGNOSIS — E1129 Type 2 diabetes mellitus with other diabetic kidney complication: Secondary | ICD-10-CM | POA: Diagnosis not present

## 2018-01-01 DIAGNOSIS — N186 End stage renal disease: Secondary | ICD-10-CM | POA: Diagnosis not present

## 2018-01-01 DIAGNOSIS — D631 Anemia in chronic kidney disease: Secondary | ICD-10-CM | POA: Diagnosis not present

## 2018-01-01 DIAGNOSIS — Z992 Dependence on renal dialysis: Secondary | ICD-10-CM | POA: Diagnosis not present

## 2018-01-01 DIAGNOSIS — N2581 Secondary hyperparathyroidism of renal origin: Secondary | ICD-10-CM | POA: Diagnosis not present

## 2018-01-03 DIAGNOSIS — N2581 Secondary hyperparathyroidism of renal origin: Secondary | ICD-10-CM | POA: Diagnosis not present

## 2018-01-03 DIAGNOSIS — Z992 Dependence on renal dialysis: Secondary | ICD-10-CM | POA: Diagnosis not present

## 2018-01-03 DIAGNOSIS — N186 End stage renal disease: Secondary | ICD-10-CM | POA: Diagnosis not present

## 2018-01-03 DIAGNOSIS — E1129 Type 2 diabetes mellitus with other diabetic kidney complication: Secondary | ICD-10-CM | POA: Diagnosis not present

## 2018-01-03 DIAGNOSIS — D631 Anemia in chronic kidney disease: Secondary | ICD-10-CM | POA: Diagnosis not present

## 2018-01-06 ENCOUNTER — Encounter: Payer: Self-pay | Admitting: Podiatry

## 2018-01-06 ENCOUNTER — Ambulatory Visit (INDEPENDENT_AMBULATORY_CARE_PROVIDER_SITE_OTHER): Payer: Medicare Other | Admitting: Podiatry

## 2018-01-06 DIAGNOSIS — N186 End stage renal disease: Secondary | ICD-10-CM | POA: Diagnosis not present

## 2018-01-06 DIAGNOSIS — M79675 Pain in left toe(s): Secondary | ICD-10-CM

## 2018-01-06 DIAGNOSIS — Z992 Dependence on renal dialysis: Secondary | ICD-10-CM | POA: Diagnosis not present

## 2018-01-06 DIAGNOSIS — N2581 Secondary hyperparathyroidism of renal origin: Secondary | ICD-10-CM | POA: Diagnosis not present

## 2018-01-06 DIAGNOSIS — D631 Anemia in chronic kidney disease: Secondary | ICD-10-CM | POA: Diagnosis not present

## 2018-01-06 DIAGNOSIS — E1129 Type 2 diabetes mellitus with other diabetic kidney complication: Secondary | ICD-10-CM | POA: Diagnosis not present

## 2018-01-08 DIAGNOSIS — E1129 Type 2 diabetes mellitus with other diabetic kidney complication: Secondary | ICD-10-CM | POA: Diagnosis not present

## 2018-01-08 DIAGNOSIS — D631 Anemia in chronic kidney disease: Secondary | ICD-10-CM | POA: Diagnosis not present

## 2018-01-08 DIAGNOSIS — Z992 Dependence on renal dialysis: Secondary | ICD-10-CM | POA: Diagnosis not present

## 2018-01-08 DIAGNOSIS — N2581 Secondary hyperparathyroidism of renal origin: Secondary | ICD-10-CM | POA: Diagnosis not present

## 2018-01-08 DIAGNOSIS — N186 End stage renal disease: Secondary | ICD-10-CM | POA: Diagnosis not present

## 2018-01-09 NOTE — Progress Notes (Signed)
Subjective:   Patient ID: [name], male   DOB: 57 y.o.   MRN: [id number]   HPI Patient presents stating that the left foot the second toe skin is been coming off the swelling is gone and with minimal discomfort   ROS      Objective:  Physical Exam  Neurovascular status intact with second digit left showing slight changes but improved with no indications of drainage or no indications of proximal infection     Assessment:  Improving left foot secondary to traumatic event     Plan:  Continue soaks wider shoes and if symptoms were to persist or come back patient will be seen back immediately

## 2018-01-10 DIAGNOSIS — D631 Anemia in chronic kidney disease: Secondary | ICD-10-CM | POA: Diagnosis not present

## 2018-01-10 DIAGNOSIS — N2581 Secondary hyperparathyroidism of renal origin: Secondary | ICD-10-CM | POA: Diagnosis not present

## 2018-01-10 DIAGNOSIS — Z992 Dependence on renal dialysis: Secondary | ICD-10-CM | POA: Diagnosis not present

## 2018-01-10 DIAGNOSIS — N186 End stage renal disease: Secondary | ICD-10-CM | POA: Diagnosis not present

## 2018-01-10 DIAGNOSIS — E1129 Type 2 diabetes mellitus with other diabetic kidney complication: Secondary | ICD-10-CM | POA: Diagnosis not present

## 2018-01-13 DIAGNOSIS — N2581 Secondary hyperparathyroidism of renal origin: Secondary | ICD-10-CM | POA: Diagnosis not present

## 2018-01-13 DIAGNOSIS — D631 Anemia in chronic kidney disease: Secondary | ICD-10-CM | POA: Diagnosis not present

## 2018-01-13 DIAGNOSIS — E1129 Type 2 diabetes mellitus with other diabetic kidney complication: Secondary | ICD-10-CM | POA: Diagnosis not present

## 2018-01-13 DIAGNOSIS — N186 End stage renal disease: Secondary | ICD-10-CM | POA: Diagnosis not present

## 2018-01-13 DIAGNOSIS — Z992 Dependence on renal dialysis: Secondary | ICD-10-CM | POA: Diagnosis not present

## 2018-01-14 DIAGNOSIS — N5201 Erectile dysfunction due to arterial insufficiency: Secondary | ICD-10-CM | POA: Diagnosis not present

## 2018-01-14 DIAGNOSIS — R972 Elevated prostate specific antigen [PSA]: Secondary | ICD-10-CM | POA: Diagnosis not present

## 2018-01-15 DIAGNOSIS — Z992 Dependence on renal dialysis: Secondary | ICD-10-CM | POA: Diagnosis not present

## 2018-01-15 DIAGNOSIS — N186 End stage renal disease: Secondary | ICD-10-CM | POA: Diagnosis not present

## 2018-01-15 DIAGNOSIS — D631 Anemia in chronic kidney disease: Secondary | ICD-10-CM | POA: Diagnosis not present

## 2018-01-15 DIAGNOSIS — E1129 Type 2 diabetes mellitus with other diabetic kidney complication: Secondary | ICD-10-CM | POA: Diagnosis not present

## 2018-01-15 DIAGNOSIS — N2581 Secondary hyperparathyroidism of renal origin: Secondary | ICD-10-CM | POA: Diagnosis not present

## 2018-01-17 DIAGNOSIS — N2581 Secondary hyperparathyroidism of renal origin: Secondary | ICD-10-CM | POA: Diagnosis not present

## 2018-01-17 DIAGNOSIS — D631 Anemia in chronic kidney disease: Secondary | ICD-10-CM | POA: Diagnosis not present

## 2018-01-17 DIAGNOSIS — E1129 Type 2 diabetes mellitus with other diabetic kidney complication: Secondary | ICD-10-CM | POA: Diagnosis not present

## 2018-01-17 DIAGNOSIS — N186 End stage renal disease: Secondary | ICD-10-CM | POA: Diagnosis not present

## 2018-01-17 DIAGNOSIS — Z992 Dependence on renal dialysis: Secondary | ICD-10-CM | POA: Diagnosis not present

## 2018-01-20 DIAGNOSIS — N2581 Secondary hyperparathyroidism of renal origin: Secondary | ICD-10-CM | POA: Diagnosis not present

## 2018-01-20 DIAGNOSIS — D631 Anemia in chronic kidney disease: Secondary | ICD-10-CM | POA: Diagnosis not present

## 2018-01-20 DIAGNOSIS — E1129 Type 2 diabetes mellitus with other diabetic kidney complication: Secondary | ICD-10-CM | POA: Diagnosis not present

## 2018-01-20 DIAGNOSIS — N186 End stage renal disease: Secondary | ICD-10-CM | POA: Diagnosis not present

## 2018-01-20 DIAGNOSIS — Z992 Dependence on renal dialysis: Secondary | ICD-10-CM | POA: Diagnosis not present

## 2018-01-22 DIAGNOSIS — D631 Anemia in chronic kidney disease: Secondary | ICD-10-CM | POA: Diagnosis not present

## 2018-01-22 DIAGNOSIS — E1129 Type 2 diabetes mellitus with other diabetic kidney complication: Secondary | ICD-10-CM | POA: Diagnosis not present

## 2018-01-22 DIAGNOSIS — Z992 Dependence on renal dialysis: Secondary | ICD-10-CM | POA: Diagnosis not present

## 2018-01-22 DIAGNOSIS — N2581 Secondary hyperparathyroidism of renal origin: Secondary | ICD-10-CM | POA: Diagnosis not present

## 2018-01-22 DIAGNOSIS — N186 End stage renal disease: Secondary | ICD-10-CM | POA: Diagnosis not present

## 2018-01-24 DIAGNOSIS — E1129 Type 2 diabetes mellitus with other diabetic kidney complication: Secondary | ICD-10-CM | POA: Diagnosis not present

## 2018-01-24 DIAGNOSIS — Z992 Dependence on renal dialysis: Secondary | ICD-10-CM | POA: Diagnosis not present

## 2018-01-24 DIAGNOSIS — N186 End stage renal disease: Secondary | ICD-10-CM | POA: Diagnosis not present

## 2018-01-24 DIAGNOSIS — N2581 Secondary hyperparathyroidism of renal origin: Secondary | ICD-10-CM | POA: Diagnosis not present

## 2018-01-24 DIAGNOSIS — D631 Anemia in chronic kidney disease: Secondary | ICD-10-CM | POA: Diagnosis not present

## 2018-01-25 DIAGNOSIS — N186 End stage renal disease: Secondary | ICD-10-CM | POA: Diagnosis not present

## 2018-01-25 DIAGNOSIS — Z992 Dependence on renal dialysis: Secondary | ICD-10-CM | POA: Diagnosis not present

## 2018-01-25 DIAGNOSIS — T8620 Unspecified complication of heart transplant: Secondary | ICD-10-CM | POA: Diagnosis not present

## 2018-01-27 DIAGNOSIS — N186 End stage renal disease: Secondary | ICD-10-CM | POA: Diagnosis not present

## 2018-01-27 DIAGNOSIS — E1129 Type 2 diabetes mellitus with other diabetic kidney complication: Secondary | ICD-10-CM | POA: Diagnosis not present

## 2018-01-27 DIAGNOSIS — Z992 Dependence on renal dialysis: Secondary | ICD-10-CM | POA: Diagnosis not present

## 2018-01-27 DIAGNOSIS — D631 Anemia in chronic kidney disease: Secondary | ICD-10-CM | POA: Diagnosis not present

## 2018-01-27 DIAGNOSIS — N2581 Secondary hyperparathyroidism of renal origin: Secondary | ICD-10-CM | POA: Diagnosis not present

## 2018-01-29 DIAGNOSIS — E1129 Type 2 diabetes mellitus with other diabetic kidney complication: Secondary | ICD-10-CM | POA: Diagnosis not present

## 2018-01-29 DIAGNOSIS — N2581 Secondary hyperparathyroidism of renal origin: Secondary | ICD-10-CM | POA: Diagnosis not present

## 2018-01-29 DIAGNOSIS — N186 End stage renal disease: Secondary | ICD-10-CM | POA: Diagnosis not present

## 2018-01-29 DIAGNOSIS — Z992 Dependence on renal dialysis: Secondary | ICD-10-CM | POA: Diagnosis not present

## 2018-01-29 DIAGNOSIS — D631 Anemia in chronic kidney disease: Secondary | ICD-10-CM | POA: Diagnosis not present

## 2018-01-31 DIAGNOSIS — E1129 Type 2 diabetes mellitus with other diabetic kidney complication: Secondary | ICD-10-CM | POA: Diagnosis not present

## 2018-01-31 DIAGNOSIS — N2581 Secondary hyperparathyroidism of renal origin: Secondary | ICD-10-CM | POA: Diagnosis not present

## 2018-01-31 DIAGNOSIS — N186 End stage renal disease: Secondary | ICD-10-CM | POA: Diagnosis not present

## 2018-01-31 DIAGNOSIS — D631 Anemia in chronic kidney disease: Secondary | ICD-10-CM | POA: Diagnosis not present

## 2018-01-31 DIAGNOSIS — Z992 Dependence on renal dialysis: Secondary | ICD-10-CM | POA: Diagnosis not present

## 2018-02-03 DIAGNOSIS — N186 End stage renal disease: Secondary | ICD-10-CM | POA: Diagnosis not present

## 2018-02-03 DIAGNOSIS — E1129 Type 2 diabetes mellitus with other diabetic kidney complication: Secondary | ICD-10-CM | POA: Diagnosis not present

## 2018-02-03 DIAGNOSIS — Z992 Dependence on renal dialysis: Secondary | ICD-10-CM | POA: Diagnosis not present

## 2018-02-03 DIAGNOSIS — D631 Anemia in chronic kidney disease: Secondary | ICD-10-CM | POA: Diagnosis not present

## 2018-02-03 DIAGNOSIS — N2581 Secondary hyperparathyroidism of renal origin: Secondary | ICD-10-CM | POA: Diagnosis not present

## 2018-02-05 DIAGNOSIS — Z992 Dependence on renal dialysis: Secondary | ICD-10-CM | POA: Diagnosis not present

## 2018-02-05 DIAGNOSIS — N186 End stage renal disease: Secondary | ICD-10-CM | POA: Diagnosis not present

## 2018-02-05 DIAGNOSIS — D631 Anemia in chronic kidney disease: Secondary | ICD-10-CM | POA: Diagnosis not present

## 2018-02-05 DIAGNOSIS — N2581 Secondary hyperparathyroidism of renal origin: Secondary | ICD-10-CM | POA: Diagnosis not present

## 2018-02-05 DIAGNOSIS — E1129 Type 2 diabetes mellitus with other diabetic kidney complication: Secondary | ICD-10-CM | POA: Diagnosis not present

## 2018-02-07 DIAGNOSIS — N186 End stage renal disease: Secondary | ICD-10-CM | POA: Diagnosis not present

## 2018-02-07 DIAGNOSIS — N2581 Secondary hyperparathyroidism of renal origin: Secondary | ICD-10-CM | POA: Diagnosis not present

## 2018-02-07 DIAGNOSIS — Z992 Dependence on renal dialysis: Secondary | ICD-10-CM | POA: Diagnosis not present

## 2018-02-07 DIAGNOSIS — E1129 Type 2 diabetes mellitus with other diabetic kidney complication: Secondary | ICD-10-CM | POA: Diagnosis not present

## 2018-02-07 DIAGNOSIS — D631 Anemia in chronic kidney disease: Secondary | ICD-10-CM | POA: Diagnosis not present

## 2018-02-10 DIAGNOSIS — N2581 Secondary hyperparathyroidism of renal origin: Secondary | ICD-10-CM | POA: Diagnosis not present

## 2018-02-10 DIAGNOSIS — D631 Anemia in chronic kidney disease: Secondary | ICD-10-CM | POA: Diagnosis not present

## 2018-02-10 DIAGNOSIS — Z992 Dependence on renal dialysis: Secondary | ICD-10-CM | POA: Diagnosis not present

## 2018-02-10 DIAGNOSIS — N186 End stage renal disease: Secondary | ICD-10-CM | POA: Diagnosis not present

## 2018-02-10 DIAGNOSIS — E1129 Type 2 diabetes mellitus with other diabetic kidney complication: Secondary | ICD-10-CM | POA: Diagnosis not present

## 2018-02-11 DIAGNOSIS — Z992 Dependence on renal dialysis: Secondary | ICD-10-CM | POA: Diagnosis not present

## 2018-02-11 DIAGNOSIS — Z713 Dietary counseling and surveillance: Secondary | ICD-10-CM | POA: Diagnosis not present

## 2018-02-11 DIAGNOSIS — E669 Obesity, unspecified: Secondary | ICD-10-CM | POA: Diagnosis not present

## 2018-02-11 DIAGNOSIS — Z941 Heart transplant status: Secondary | ICD-10-CM | POA: Diagnosis not present

## 2018-02-11 DIAGNOSIS — I10 Essential (primary) hypertension: Secondary | ICD-10-CM | POA: Diagnosis not present

## 2018-02-11 DIAGNOSIS — N186 End stage renal disease: Secondary | ICD-10-CM | POA: Diagnosis not present

## 2018-02-11 DIAGNOSIS — Z9884 Bariatric surgery status: Secondary | ICD-10-CM | POA: Diagnosis not present

## 2018-02-11 DIAGNOSIS — K912 Postsurgical malabsorption, not elsewhere classified: Secondary | ICD-10-CM | POA: Diagnosis not present

## 2018-02-11 DIAGNOSIS — Z7682 Awaiting organ transplant status: Secondary | ICD-10-CM | POA: Diagnosis not present

## 2018-02-11 DIAGNOSIS — Z48815 Encounter for surgical aftercare following surgery on the digestive system: Secondary | ICD-10-CM | POA: Diagnosis not present

## 2018-02-12 DIAGNOSIS — E1129 Type 2 diabetes mellitus with other diabetic kidney complication: Secondary | ICD-10-CM | POA: Diagnosis not present

## 2018-02-12 DIAGNOSIS — N2581 Secondary hyperparathyroidism of renal origin: Secondary | ICD-10-CM | POA: Diagnosis not present

## 2018-02-12 DIAGNOSIS — N186 End stage renal disease: Secondary | ICD-10-CM | POA: Diagnosis not present

## 2018-02-12 DIAGNOSIS — D631 Anemia in chronic kidney disease: Secondary | ICD-10-CM | POA: Diagnosis not present

## 2018-02-12 DIAGNOSIS — Z992 Dependence on renal dialysis: Secondary | ICD-10-CM | POA: Diagnosis not present

## 2018-02-14 DIAGNOSIS — N2581 Secondary hyperparathyroidism of renal origin: Secondary | ICD-10-CM | POA: Diagnosis not present

## 2018-02-14 DIAGNOSIS — N186 End stage renal disease: Secondary | ICD-10-CM | POA: Diagnosis not present

## 2018-02-14 DIAGNOSIS — Z992 Dependence on renal dialysis: Secondary | ICD-10-CM | POA: Diagnosis not present

## 2018-02-14 DIAGNOSIS — E1129 Type 2 diabetes mellitus with other diabetic kidney complication: Secondary | ICD-10-CM | POA: Diagnosis not present

## 2018-02-14 DIAGNOSIS — D631 Anemia in chronic kidney disease: Secondary | ICD-10-CM | POA: Diagnosis not present

## 2018-02-17 DIAGNOSIS — E1129 Type 2 diabetes mellitus with other diabetic kidney complication: Secondary | ICD-10-CM | POA: Diagnosis not present

## 2018-02-17 DIAGNOSIS — Z992 Dependence on renal dialysis: Secondary | ICD-10-CM | POA: Diagnosis not present

## 2018-02-17 DIAGNOSIS — D631 Anemia in chronic kidney disease: Secondary | ICD-10-CM | POA: Diagnosis not present

## 2018-02-17 DIAGNOSIS — N2581 Secondary hyperparathyroidism of renal origin: Secondary | ICD-10-CM | POA: Diagnosis not present

## 2018-02-17 DIAGNOSIS — N186 End stage renal disease: Secondary | ICD-10-CM | POA: Diagnosis not present

## 2018-02-19 DIAGNOSIS — N186 End stage renal disease: Secondary | ICD-10-CM | POA: Diagnosis not present

## 2018-02-19 DIAGNOSIS — D631 Anemia in chronic kidney disease: Secondary | ICD-10-CM | POA: Diagnosis not present

## 2018-02-19 DIAGNOSIS — Z992 Dependence on renal dialysis: Secondary | ICD-10-CM | POA: Diagnosis not present

## 2018-02-19 DIAGNOSIS — E1129 Type 2 diabetes mellitus with other diabetic kidney complication: Secondary | ICD-10-CM | POA: Diagnosis not present

## 2018-02-19 DIAGNOSIS — N2581 Secondary hyperparathyroidism of renal origin: Secondary | ICD-10-CM | POA: Diagnosis not present

## 2018-02-21 DIAGNOSIS — E1129 Type 2 diabetes mellitus with other diabetic kidney complication: Secondary | ICD-10-CM | POA: Diagnosis not present

## 2018-02-21 DIAGNOSIS — N2581 Secondary hyperparathyroidism of renal origin: Secondary | ICD-10-CM | POA: Diagnosis not present

## 2018-02-21 DIAGNOSIS — Z992 Dependence on renal dialysis: Secondary | ICD-10-CM | POA: Diagnosis not present

## 2018-02-21 DIAGNOSIS — N186 End stage renal disease: Secondary | ICD-10-CM | POA: Diagnosis not present

## 2018-02-21 DIAGNOSIS — D631 Anemia in chronic kidney disease: Secondary | ICD-10-CM | POA: Diagnosis not present

## 2018-02-24 DIAGNOSIS — N186 End stage renal disease: Secondary | ICD-10-CM | POA: Diagnosis not present

## 2018-02-24 DIAGNOSIS — T8620 Unspecified complication of heart transplant: Secondary | ICD-10-CM | POA: Diagnosis not present

## 2018-02-24 DIAGNOSIS — D631 Anemia in chronic kidney disease: Secondary | ICD-10-CM | POA: Diagnosis not present

## 2018-02-24 DIAGNOSIS — E1129 Type 2 diabetes mellitus with other diabetic kidney complication: Secondary | ICD-10-CM | POA: Diagnosis not present

## 2018-02-24 DIAGNOSIS — D509 Iron deficiency anemia, unspecified: Secondary | ICD-10-CM | POA: Diagnosis not present

## 2018-02-24 DIAGNOSIS — Z992 Dependence on renal dialysis: Secondary | ICD-10-CM | POA: Diagnosis not present

## 2018-02-24 DIAGNOSIS — N2581 Secondary hyperparathyroidism of renal origin: Secondary | ICD-10-CM | POA: Diagnosis not present

## 2018-02-26 DIAGNOSIS — N186 End stage renal disease: Secondary | ICD-10-CM | POA: Diagnosis not present

## 2018-02-26 DIAGNOSIS — E1129 Type 2 diabetes mellitus with other diabetic kidney complication: Secondary | ICD-10-CM | POA: Diagnosis not present

## 2018-02-26 DIAGNOSIS — N2581 Secondary hyperparathyroidism of renal origin: Secondary | ICD-10-CM | POA: Diagnosis not present

## 2018-02-26 DIAGNOSIS — Z992 Dependence on renal dialysis: Secondary | ICD-10-CM | POA: Diagnosis not present

## 2018-02-26 DIAGNOSIS — D631 Anemia in chronic kidney disease: Secondary | ICD-10-CM | POA: Diagnosis not present

## 2018-02-26 DIAGNOSIS — D509 Iron deficiency anemia, unspecified: Secondary | ICD-10-CM | POA: Diagnosis not present

## 2018-02-28 DIAGNOSIS — N186 End stage renal disease: Secondary | ICD-10-CM | POA: Diagnosis not present

## 2018-02-28 DIAGNOSIS — N2581 Secondary hyperparathyroidism of renal origin: Secondary | ICD-10-CM | POA: Diagnosis not present

## 2018-02-28 DIAGNOSIS — Z992 Dependence on renal dialysis: Secondary | ICD-10-CM | POA: Diagnosis not present

## 2018-02-28 DIAGNOSIS — D631 Anemia in chronic kidney disease: Secondary | ICD-10-CM | POA: Diagnosis not present

## 2018-02-28 DIAGNOSIS — D509 Iron deficiency anemia, unspecified: Secondary | ICD-10-CM | POA: Diagnosis not present

## 2018-02-28 DIAGNOSIS — E1129 Type 2 diabetes mellitus with other diabetic kidney complication: Secondary | ICD-10-CM | POA: Diagnosis not present

## 2018-03-03 DIAGNOSIS — D509 Iron deficiency anemia, unspecified: Secondary | ICD-10-CM | POA: Diagnosis not present

## 2018-03-03 DIAGNOSIS — N186 End stage renal disease: Secondary | ICD-10-CM | POA: Diagnosis not present

## 2018-03-03 DIAGNOSIS — E1129 Type 2 diabetes mellitus with other diabetic kidney complication: Secondary | ICD-10-CM | POA: Diagnosis not present

## 2018-03-03 DIAGNOSIS — D631 Anemia in chronic kidney disease: Secondary | ICD-10-CM | POA: Diagnosis not present

## 2018-03-03 DIAGNOSIS — N2581 Secondary hyperparathyroidism of renal origin: Secondary | ICD-10-CM | POA: Diagnosis not present

## 2018-03-03 DIAGNOSIS — Z992 Dependence on renal dialysis: Secondary | ICD-10-CM | POA: Diagnosis not present

## 2018-03-05 DIAGNOSIS — N2581 Secondary hyperparathyroidism of renal origin: Secondary | ICD-10-CM | POA: Diagnosis not present

## 2018-03-05 DIAGNOSIS — D509 Iron deficiency anemia, unspecified: Secondary | ICD-10-CM | POA: Diagnosis not present

## 2018-03-05 DIAGNOSIS — D631 Anemia in chronic kidney disease: Secondary | ICD-10-CM | POA: Diagnosis not present

## 2018-03-05 DIAGNOSIS — N186 End stage renal disease: Secondary | ICD-10-CM | POA: Diagnosis not present

## 2018-03-05 DIAGNOSIS — E1129 Type 2 diabetes mellitus with other diabetic kidney complication: Secondary | ICD-10-CM | POA: Diagnosis not present

## 2018-03-05 DIAGNOSIS — Z992 Dependence on renal dialysis: Secondary | ICD-10-CM | POA: Diagnosis not present

## 2018-03-07 DIAGNOSIS — D509 Iron deficiency anemia, unspecified: Secondary | ICD-10-CM | POA: Diagnosis not present

## 2018-03-07 DIAGNOSIS — N2581 Secondary hyperparathyroidism of renal origin: Secondary | ICD-10-CM | POA: Diagnosis not present

## 2018-03-07 DIAGNOSIS — N186 End stage renal disease: Secondary | ICD-10-CM | POA: Diagnosis not present

## 2018-03-07 DIAGNOSIS — D631 Anemia in chronic kidney disease: Secondary | ICD-10-CM | POA: Diagnosis not present

## 2018-03-07 DIAGNOSIS — Z992 Dependence on renal dialysis: Secondary | ICD-10-CM | POA: Diagnosis not present

## 2018-03-07 DIAGNOSIS — E1129 Type 2 diabetes mellitus with other diabetic kidney complication: Secondary | ICD-10-CM | POA: Diagnosis not present

## 2018-03-10 DIAGNOSIS — E1129 Type 2 diabetes mellitus with other diabetic kidney complication: Secondary | ICD-10-CM | POA: Diagnosis not present

## 2018-03-10 DIAGNOSIS — Z992 Dependence on renal dialysis: Secondary | ICD-10-CM | POA: Diagnosis not present

## 2018-03-10 DIAGNOSIS — N186 End stage renal disease: Secondary | ICD-10-CM | POA: Diagnosis not present

## 2018-03-10 DIAGNOSIS — N2581 Secondary hyperparathyroidism of renal origin: Secondary | ICD-10-CM | POA: Diagnosis not present

## 2018-03-10 DIAGNOSIS — D631 Anemia in chronic kidney disease: Secondary | ICD-10-CM | POA: Diagnosis not present

## 2018-03-10 DIAGNOSIS — D509 Iron deficiency anemia, unspecified: Secondary | ICD-10-CM | POA: Diagnosis not present

## 2018-03-12 DIAGNOSIS — N186 End stage renal disease: Secondary | ICD-10-CM | POA: Diagnosis not present

## 2018-03-12 DIAGNOSIS — N2581 Secondary hyperparathyroidism of renal origin: Secondary | ICD-10-CM | POA: Diagnosis not present

## 2018-03-12 DIAGNOSIS — D509 Iron deficiency anemia, unspecified: Secondary | ICD-10-CM | POA: Diagnosis not present

## 2018-03-12 DIAGNOSIS — D631 Anemia in chronic kidney disease: Secondary | ICD-10-CM | POA: Diagnosis not present

## 2018-03-12 DIAGNOSIS — Z992 Dependence on renal dialysis: Secondary | ICD-10-CM | POA: Diagnosis not present

## 2018-03-12 DIAGNOSIS — E1129 Type 2 diabetes mellitus with other diabetic kidney complication: Secondary | ICD-10-CM | POA: Diagnosis not present

## 2018-03-14 DIAGNOSIS — N2581 Secondary hyperparathyroidism of renal origin: Secondary | ICD-10-CM | POA: Diagnosis not present

## 2018-03-14 DIAGNOSIS — N186 End stage renal disease: Secondary | ICD-10-CM | POA: Diagnosis not present

## 2018-03-14 DIAGNOSIS — D631 Anemia in chronic kidney disease: Secondary | ICD-10-CM | POA: Diagnosis not present

## 2018-03-14 DIAGNOSIS — E1129 Type 2 diabetes mellitus with other diabetic kidney complication: Secondary | ICD-10-CM | POA: Diagnosis not present

## 2018-03-14 DIAGNOSIS — Z992 Dependence on renal dialysis: Secondary | ICD-10-CM | POA: Diagnosis not present

## 2018-03-14 DIAGNOSIS — D509 Iron deficiency anemia, unspecified: Secondary | ICD-10-CM | POA: Diagnosis not present

## 2018-03-17 DIAGNOSIS — E1129 Type 2 diabetes mellitus with other diabetic kidney complication: Secondary | ICD-10-CM | POA: Diagnosis not present

## 2018-03-17 DIAGNOSIS — D631 Anemia in chronic kidney disease: Secondary | ICD-10-CM | POA: Diagnosis not present

## 2018-03-17 DIAGNOSIS — N2581 Secondary hyperparathyroidism of renal origin: Secondary | ICD-10-CM | POA: Diagnosis not present

## 2018-03-17 DIAGNOSIS — D509 Iron deficiency anemia, unspecified: Secondary | ICD-10-CM | POA: Diagnosis not present

## 2018-03-17 DIAGNOSIS — Z992 Dependence on renal dialysis: Secondary | ICD-10-CM | POA: Diagnosis not present

## 2018-03-17 DIAGNOSIS — N186 End stage renal disease: Secondary | ICD-10-CM | POA: Diagnosis not present

## 2018-03-19 DIAGNOSIS — N2581 Secondary hyperparathyroidism of renal origin: Secondary | ICD-10-CM | POA: Diagnosis not present

## 2018-03-19 DIAGNOSIS — Z992 Dependence on renal dialysis: Secondary | ICD-10-CM | POA: Diagnosis not present

## 2018-03-19 DIAGNOSIS — D509 Iron deficiency anemia, unspecified: Secondary | ICD-10-CM | POA: Diagnosis not present

## 2018-03-19 DIAGNOSIS — E1129 Type 2 diabetes mellitus with other diabetic kidney complication: Secondary | ICD-10-CM | POA: Diagnosis not present

## 2018-03-19 DIAGNOSIS — D631 Anemia in chronic kidney disease: Secondary | ICD-10-CM | POA: Diagnosis not present

## 2018-03-19 DIAGNOSIS — N186 End stage renal disease: Secondary | ICD-10-CM | POA: Diagnosis not present

## 2018-03-21 DIAGNOSIS — D509 Iron deficiency anemia, unspecified: Secondary | ICD-10-CM | POA: Diagnosis not present

## 2018-03-21 DIAGNOSIS — N2581 Secondary hyperparathyroidism of renal origin: Secondary | ICD-10-CM | POA: Diagnosis not present

## 2018-03-21 DIAGNOSIS — N186 End stage renal disease: Secondary | ICD-10-CM | POA: Diagnosis not present

## 2018-03-21 DIAGNOSIS — E1129 Type 2 diabetes mellitus with other diabetic kidney complication: Secondary | ICD-10-CM | POA: Diagnosis not present

## 2018-03-21 DIAGNOSIS — D631 Anemia in chronic kidney disease: Secondary | ICD-10-CM | POA: Diagnosis not present

## 2018-03-21 DIAGNOSIS — Z992 Dependence on renal dialysis: Secondary | ICD-10-CM | POA: Diagnosis not present

## 2018-03-24 DIAGNOSIS — Z992 Dependence on renal dialysis: Secondary | ICD-10-CM | POA: Diagnosis not present

## 2018-03-24 DIAGNOSIS — D509 Iron deficiency anemia, unspecified: Secondary | ICD-10-CM | POA: Diagnosis not present

## 2018-03-24 DIAGNOSIS — N186 End stage renal disease: Secondary | ICD-10-CM | POA: Diagnosis not present

## 2018-03-24 DIAGNOSIS — E1129 Type 2 diabetes mellitus with other diabetic kidney complication: Secondary | ICD-10-CM | POA: Diagnosis not present

## 2018-03-24 DIAGNOSIS — N2581 Secondary hyperparathyroidism of renal origin: Secondary | ICD-10-CM | POA: Diagnosis not present

## 2018-03-24 DIAGNOSIS — D631 Anemia in chronic kidney disease: Secondary | ICD-10-CM | POA: Diagnosis not present

## 2018-03-25 DIAGNOSIS — R972 Elevated prostate specific antigen [PSA]: Secondary | ICD-10-CM | POA: Diagnosis not present

## 2018-03-25 DIAGNOSIS — C61 Malignant neoplasm of prostate: Secondary | ICD-10-CM | POA: Diagnosis not present

## 2018-03-25 DIAGNOSIS — D075 Carcinoma in situ of prostate: Secondary | ICD-10-CM | POA: Diagnosis not present

## 2018-03-26 DIAGNOSIS — E1129 Type 2 diabetes mellitus with other diabetic kidney complication: Secondary | ICD-10-CM | POA: Diagnosis not present

## 2018-03-26 DIAGNOSIS — D631 Anemia in chronic kidney disease: Secondary | ICD-10-CM | POA: Diagnosis not present

## 2018-03-26 DIAGNOSIS — N2581 Secondary hyperparathyroidism of renal origin: Secondary | ICD-10-CM | POA: Diagnosis not present

## 2018-03-26 DIAGNOSIS — N186 End stage renal disease: Secondary | ICD-10-CM | POA: Diagnosis not present

## 2018-03-26 DIAGNOSIS — Z992 Dependence on renal dialysis: Secondary | ICD-10-CM | POA: Diagnosis not present

## 2018-03-26 DIAGNOSIS — D509 Iron deficiency anemia, unspecified: Secondary | ICD-10-CM | POA: Diagnosis not present

## 2018-03-27 DIAGNOSIS — T8620 Unspecified complication of heart transplant: Secondary | ICD-10-CM | POA: Diagnosis not present

## 2018-03-27 DIAGNOSIS — Z992 Dependence on renal dialysis: Secondary | ICD-10-CM | POA: Diagnosis not present

## 2018-03-27 DIAGNOSIS — N186 End stage renal disease: Secondary | ICD-10-CM | POA: Diagnosis not present

## 2018-03-28 DIAGNOSIS — N2581 Secondary hyperparathyroidism of renal origin: Secondary | ICD-10-CM | POA: Diagnosis not present

## 2018-03-28 DIAGNOSIS — Z992 Dependence on renal dialysis: Secondary | ICD-10-CM | POA: Diagnosis not present

## 2018-03-28 DIAGNOSIS — D631 Anemia in chronic kidney disease: Secondary | ICD-10-CM | POA: Diagnosis not present

## 2018-03-28 DIAGNOSIS — E1129 Type 2 diabetes mellitus with other diabetic kidney complication: Secondary | ICD-10-CM | POA: Diagnosis not present

## 2018-03-28 DIAGNOSIS — D509 Iron deficiency anemia, unspecified: Secondary | ICD-10-CM | POA: Diagnosis not present

## 2018-03-28 DIAGNOSIS — N186 End stage renal disease: Secondary | ICD-10-CM | POA: Diagnosis not present

## 2018-03-31 DIAGNOSIS — D509 Iron deficiency anemia, unspecified: Secondary | ICD-10-CM | POA: Diagnosis not present

## 2018-03-31 DIAGNOSIS — N2581 Secondary hyperparathyroidism of renal origin: Secondary | ICD-10-CM | POA: Diagnosis not present

## 2018-03-31 DIAGNOSIS — N186 End stage renal disease: Secondary | ICD-10-CM | POA: Diagnosis not present

## 2018-03-31 DIAGNOSIS — D631 Anemia in chronic kidney disease: Secondary | ICD-10-CM | POA: Diagnosis not present

## 2018-03-31 DIAGNOSIS — E1129 Type 2 diabetes mellitus with other diabetic kidney complication: Secondary | ICD-10-CM | POA: Diagnosis not present

## 2018-03-31 DIAGNOSIS — Z992 Dependence on renal dialysis: Secondary | ICD-10-CM | POA: Diagnosis not present

## 2018-04-02 DIAGNOSIS — N2581 Secondary hyperparathyroidism of renal origin: Secondary | ICD-10-CM | POA: Diagnosis not present

## 2018-04-02 DIAGNOSIS — E1129 Type 2 diabetes mellitus with other diabetic kidney complication: Secondary | ICD-10-CM | POA: Diagnosis not present

## 2018-04-02 DIAGNOSIS — D631 Anemia in chronic kidney disease: Secondary | ICD-10-CM | POA: Diagnosis not present

## 2018-04-02 DIAGNOSIS — Z992 Dependence on renal dialysis: Secondary | ICD-10-CM | POA: Diagnosis not present

## 2018-04-02 DIAGNOSIS — D509 Iron deficiency anemia, unspecified: Secondary | ICD-10-CM | POA: Diagnosis not present

## 2018-04-02 DIAGNOSIS — N186 End stage renal disease: Secondary | ICD-10-CM | POA: Diagnosis not present

## 2018-04-04 DIAGNOSIS — N186 End stage renal disease: Secondary | ICD-10-CM | POA: Diagnosis not present

## 2018-04-04 DIAGNOSIS — N2581 Secondary hyperparathyroidism of renal origin: Secondary | ICD-10-CM | POA: Diagnosis not present

## 2018-04-04 DIAGNOSIS — D509 Iron deficiency anemia, unspecified: Secondary | ICD-10-CM | POA: Diagnosis not present

## 2018-04-04 DIAGNOSIS — Z992 Dependence on renal dialysis: Secondary | ICD-10-CM | POA: Diagnosis not present

## 2018-04-04 DIAGNOSIS — E1129 Type 2 diabetes mellitus with other diabetic kidney complication: Secondary | ICD-10-CM | POA: Diagnosis not present

## 2018-04-04 DIAGNOSIS — D631 Anemia in chronic kidney disease: Secondary | ICD-10-CM | POA: Diagnosis not present

## 2018-04-07 DIAGNOSIS — D631 Anemia in chronic kidney disease: Secondary | ICD-10-CM | POA: Diagnosis not present

## 2018-04-07 DIAGNOSIS — N186 End stage renal disease: Secondary | ICD-10-CM | POA: Diagnosis not present

## 2018-04-07 DIAGNOSIS — Z992 Dependence on renal dialysis: Secondary | ICD-10-CM | POA: Diagnosis not present

## 2018-04-07 DIAGNOSIS — D509 Iron deficiency anemia, unspecified: Secondary | ICD-10-CM | POA: Diagnosis not present

## 2018-04-07 DIAGNOSIS — E1129 Type 2 diabetes mellitus with other diabetic kidney complication: Secondary | ICD-10-CM | POA: Diagnosis not present

## 2018-04-07 DIAGNOSIS — N2581 Secondary hyperparathyroidism of renal origin: Secondary | ICD-10-CM | POA: Diagnosis not present

## 2018-04-09 DIAGNOSIS — Z992 Dependence on renal dialysis: Secondary | ICD-10-CM | POA: Diagnosis not present

## 2018-04-09 DIAGNOSIS — D631 Anemia in chronic kidney disease: Secondary | ICD-10-CM | POA: Diagnosis not present

## 2018-04-09 DIAGNOSIS — D509 Iron deficiency anemia, unspecified: Secondary | ICD-10-CM | POA: Diagnosis not present

## 2018-04-09 DIAGNOSIS — E1129 Type 2 diabetes mellitus with other diabetic kidney complication: Secondary | ICD-10-CM | POA: Diagnosis not present

## 2018-04-09 DIAGNOSIS — N186 End stage renal disease: Secondary | ICD-10-CM | POA: Diagnosis not present

## 2018-04-09 DIAGNOSIS — N2581 Secondary hyperparathyroidism of renal origin: Secondary | ICD-10-CM | POA: Diagnosis not present

## 2018-04-11 DIAGNOSIS — D509 Iron deficiency anemia, unspecified: Secondary | ICD-10-CM | POA: Diagnosis not present

## 2018-04-11 DIAGNOSIS — D631 Anemia in chronic kidney disease: Secondary | ICD-10-CM | POA: Diagnosis not present

## 2018-04-11 DIAGNOSIS — N186 End stage renal disease: Secondary | ICD-10-CM | POA: Diagnosis not present

## 2018-04-11 DIAGNOSIS — E1129 Type 2 diabetes mellitus with other diabetic kidney complication: Secondary | ICD-10-CM | POA: Diagnosis not present

## 2018-04-11 DIAGNOSIS — Z992 Dependence on renal dialysis: Secondary | ICD-10-CM | POA: Diagnosis not present

## 2018-04-11 DIAGNOSIS — N2581 Secondary hyperparathyroidism of renal origin: Secondary | ICD-10-CM | POA: Diagnosis not present

## 2018-04-14 DIAGNOSIS — E1129 Type 2 diabetes mellitus with other diabetic kidney complication: Secondary | ICD-10-CM | POA: Diagnosis not present

## 2018-04-14 DIAGNOSIS — D631 Anemia in chronic kidney disease: Secondary | ICD-10-CM | POA: Diagnosis not present

## 2018-04-14 DIAGNOSIS — Z992 Dependence on renal dialysis: Secondary | ICD-10-CM | POA: Diagnosis not present

## 2018-04-14 DIAGNOSIS — N2581 Secondary hyperparathyroidism of renal origin: Secondary | ICD-10-CM | POA: Diagnosis not present

## 2018-04-14 DIAGNOSIS — N186 End stage renal disease: Secondary | ICD-10-CM | POA: Diagnosis not present

## 2018-04-14 DIAGNOSIS — D509 Iron deficiency anemia, unspecified: Secondary | ICD-10-CM | POA: Diagnosis not present

## 2018-04-16 DIAGNOSIS — Z992 Dependence on renal dialysis: Secondary | ICD-10-CM | POA: Diagnosis not present

## 2018-04-16 DIAGNOSIS — D631 Anemia in chronic kidney disease: Secondary | ICD-10-CM | POA: Diagnosis not present

## 2018-04-16 DIAGNOSIS — N186 End stage renal disease: Secondary | ICD-10-CM | POA: Diagnosis not present

## 2018-04-16 DIAGNOSIS — D509 Iron deficiency anemia, unspecified: Secondary | ICD-10-CM | POA: Diagnosis not present

## 2018-04-16 DIAGNOSIS — N2581 Secondary hyperparathyroidism of renal origin: Secondary | ICD-10-CM | POA: Diagnosis not present

## 2018-04-16 DIAGNOSIS — E1129 Type 2 diabetes mellitus with other diabetic kidney complication: Secondary | ICD-10-CM | POA: Diagnosis not present

## 2018-04-18 DIAGNOSIS — D509 Iron deficiency anemia, unspecified: Secondary | ICD-10-CM | POA: Diagnosis not present

## 2018-04-18 DIAGNOSIS — N186 End stage renal disease: Secondary | ICD-10-CM | POA: Diagnosis not present

## 2018-04-18 DIAGNOSIS — D631 Anemia in chronic kidney disease: Secondary | ICD-10-CM | POA: Diagnosis not present

## 2018-04-18 DIAGNOSIS — N2581 Secondary hyperparathyroidism of renal origin: Secondary | ICD-10-CM | POA: Diagnosis not present

## 2018-04-18 DIAGNOSIS — Z992 Dependence on renal dialysis: Secondary | ICD-10-CM | POA: Diagnosis not present

## 2018-04-18 DIAGNOSIS — E1129 Type 2 diabetes mellitus with other diabetic kidney complication: Secondary | ICD-10-CM | POA: Diagnosis not present

## 2018-04-21 DIAGNOSIS — N2581 Secondary hyperparathyroidism of renal origin: Secondary | ICD-10-CM | POA: Diagnosis not present

## 2018-04-21 DIAGNOSIS — D631 Anemia in chronic kidney disease: Secondary | ICD-10-CM | POA: Diagnosis not present

## 2018-04-21 DIAGNOSIS — Z992 Dependence on renal dialysis: Secondary | ICD-10-CM | POA: Diagnosis not present

## 2018-04-21 DIAGNOSIS — E1129 Type 2 diabetes mellitus with other diabetic kidney complication: Secondary | ICD-10-CM | POA: Diagnosis not present

## 2018-04-21 DIAGNOSIS — D509 Iron deficiency anemia, unspecified: Secondary | ICD-10-CM | POA: Diagnosis not present

## 2018-04-21 DIAGNOSIS — N186 End stage renal disease: Secondary | ICD-10-CM | POA: Diagnosis not present

## 2018-04-22 DIAGNOSIS — C61 Malignant neoplasm of prostate: Secondary | ICD-10-CM | POA: Diagnosis not present

## 2018-04-23 DIAGNOSIS — E1129 Type 2 diabetes mellitus with other diabetic kidney complication: Secondary | ICD-10-CM | POA: Diagnosis not present

## 2018-04-23 DIAGNOSIS — Z992 Dependence on renal dialysis: Secondary | ICD-10-CM | POA: Diagnosis not present

## 2018-04-23 DIAGNOSIS — D509 Iron deficiency anemia, unspecified: Secondary | ICD-10-CM | POA: Diagnosis not present

## 2018-04-23 DIAGNOSIS — N186 End stage renal disease: Secondary | ICD-10-CM | POA: Diagnosis not present

## 2018-04-23 DIAGNOSIS — N2581 Secondary hyperparathyroidism of renal origin: Secondary | ICD-10-CM | POA: Diagnosis not present

## 2018-04-23 DIAGNOSIS — D631 Anemia in chronic kidney disease: Secondary | ICD-10-CM | POA: Diagnosis not present

## 2018-04-24 ENCOUNTER — Telehealth: Payer: Self-pay | Admitting: Medical Oncology

## 2018-04-24 NOTE — Telephone Encounter (Signed)
I called pt to introduce myself as the Prostate Nurse Navigator and the Coordinator of the Pro[facility] [place].  1. I confirmed with the patient he is aware of his referral to the clinic 05/06/18 arriving at 12:30 pm.  2. I discussed the format of the clinic and the physicians he will be seeing that day. He states his wife is a patient here at [facility] and is very familiar with the center.   3. I discussed where the clinic is located and how to contact me.  4. I confirmed his address and informed him I would be mailing a packet of information and forms to be completed. I asked him to bring them with him the day of his appointment.   He voiced understanding of the above. I asked him to call me if he has any questions or concerns regarding his appointments or the forms he needs to complete.

## 2018-04-25 DIAGNOSIS — E1129 Type 2 diabetes mellitus with other diabetic kidney complication: Secondary | ICD-10-CM | POA: Diagnosis not present

## 2018-04-25 DIAGNOSIS — D509 Iron deficiency anemia, unspecified: Secondary | ICD-10-CM | POA: Diagnosis not present

## 2018-04-25 DIAGNOSIS — N2581 Secondary hyperparathyroidism of renal origin: Secondary | ICD-10-CM | POA: Diagnosis not present

## 2018-04-25 DIAGNOSIS — N186 End stage renal disease: Secondary | ICD-10-CM | POA: Diagnosis not present

## 2018-04-25 DIAGNOSIS — D631 Anemia in chronic kidney disease: Secondary | ICD-10-CM | POA: Diagnosis not present

## 2018-04-25 DIAGNOSIS — Z992 Dependence on renal dialysis: Secondary | ICD-10-CM | POA: Diagnosis not present

## 2018-04-27 DIAGNOSIS — N186 End stage renal disease: Secondary | ICD-10-CM | POA: Diagnosis not present

## 2018-04-27 DIAGNOSIS — T8620 Unspecified complication of heart transplant: Secondary | ICD-10-CM | POA: Diagnosis not present

## 2018-04-27 DIAGNOSIS — Z992 Dependence on renal dialysis: Secondary | ICD-10-CM | POA: Diagnosis not present

## 2018-04-28 DIAGNOSIS — N186 End stage renal disease: Secondary | ICD-10-CM | POA: Diagnosis not present

## 2018-04-28 DIAGNOSIS — Z992 Dependence on renal dialysis: Secondary | ICD-10-CM | POA: Diagnosis not present

## 2018-04-28 DIAGNOSIS — D509 Iron deficiency anemia, unspecified: Secondary | ICD-10-CM | POA: Diagnosis not present

## 2018-04-28 DIAGNOSIS — N2581 Secondary hyperparathyroidism of renal origin: Secondary | ICD-10-CM | POA: Diagnosis not present

## 2018-04-28 DIAGNOSIS — E1129 Type 2 diabetes mellitus with other diabetic kidney complication: Secondary | ICD-10-CM | POA: Diagnosis not present

## 2018-04-28 DIAGNOSIS — D631 Anemia in chronic kidney disease: Secondary | ICD-10-CM | POA: Diagnosis not present

## 2018-04-30 DIAGNOSIS — D631 Anemia in chronic kidney disease: Secondary | ICD-10-CM | POA: Diagnosis not present

## 2018-04-30 DIAGNOSIS — N186 End stage renal disease: Secondary | ICD-10-CM | POA: Diagnosis not present

## 2018-04-30 DIAGNOSIS — D509 Iron deficiency anemia, unspecified: Secondary | ICD-10-CM | POA: Diagnosis not present

## 2018-04-30 DIAGNOSIS — N2581 Secondary hyperparathyroidism of renal origin: Secondary | ICD-10-CM | POA: Diagnosis not present

## 2018-04-30 DIAGNOSIS — E1129 Type 2 diabetes mellitus with other diabetic kidney complication: Secondary | ICD-10-CM | POA: Diagnosis not present

## 2018-04-30 DIAGNOSIS — Z992 Dependence on renal dialysis: Secondary | ICD-10-CM | POA: Diagnosis not present

## 2018-05-01 ENCOUNTER — Encounter: Payer: Self-pay | Admitting: Medical Oncology

## 2018-05-01 DIAGNOSIS — C61 Malignant neoplasm of prostate: Secondary | ICD-10-CM | POA: Diagnosis not present

## 2018-05-02 DIAGNOSIS — E1129 Type 2 diabetes mellitus with other diabetic kidney complication: Secondary | ICD-10-CM | POA: Diagnosis not present

## 2018-05-02 DIAGNOSIS — D509 Iron deficiency anemia, unspecified: Secondary | ICD-10-CM | POA: Diagnosis not present

## 2018-05-02 DIAGNOSIS — N186 End stage renal disease: Secondary | ICD-10-CM | POA: Diagnosis not present

## 2018-05-02 DIAGNOSIS — Z992 Dependence on renal dialysis: Secondary | ICD-10-CM | POA: Diagnosis not present

## 2018-05-02 DIAGNOSIS — N2581 Secondary hyperparathyroidism of renal origin: Secondary | ICD-10-CM | POA: Diagnosis not present

## 2018-05-02 DIAGNOSIS — D631 Anemia in chronic kidney disease: Secondary | ICD-10-CM | POA: Diagnosis not present

## 2018-05-05 DIAGNOSIS — N2581 Secondary hyperparathyroidism of renal origin: Secondary | ICD-10-CM | POA: Diagnosis not present

## 2018-05-05 DIAGNOSIS — Z992 Dependence on renal dialysis: Secondary | ICD-10-CM | POA: Diagnosis not present

## 2018-05-05 DIAGNOSIS — D631 Anemia in chronic kidney disease: Secondary | ICD-10-CM | POA: Diagnosis not present

## 2018-05-05 DIAGNOSIS — N186 End stage renal disease: Secondary | ICD-10-CM | POA: Diagnosis not present

## 2018-05-05 DIAGNOSIS — E1129 Type 2 diabetes mellitus with other diabetic kidney complication: Secondary | ICD-10-CM | POA: Diagnosis not present

## 2018-05-05 DIAGNOSIS — D509 Iron deficiency anemia, unspecified: Secondary | ICD-10-CM | POA: Diagnosis not present

## 2018-05-06 ENCOUNTER — Inpatient Hospital Stay: Payer: Medicare Other | Attending: Oncology | Admitting: Oncology

## 2018-05-06 ENCOUNTER — Encounter: Payer: Self-pay | Admitting: Radiation Oncology

## 2018-05-06 ENCOUNTER — Telehealth: Payer: Self-pay

## 2018-05-06 ENCOUNTER — Encounter: Payer: Self-pay | Admitting: General Practice

## 2018-05-06 ENCOUNTER — Encounter: Payer: Self-pay | Admitting: Medical Oncology

## 2018-05-06 ENCOUNTER — Other Ambulatory Visit: Payer: Self-pay

## 2018-05-06 ENCOUNTER — Ambulatory Visit
Admission: RE | Admit: 2018-05-06 | Discharge: 2018-05-06 | Disposition: A | Discharge status: Home / Self Care | Payer: Medicare Other | Source: Ambulatory Visit | Attending: Radiation Oncology | Admitting: Radiation Oncology

## 2018-05-06 DIAGNOSIS — Z79899 Other long term (current) drug therapy: Secondary | ICD-10-CM | POA: Diagnosis not present

## 2018-05-06 DIAGNOSIS — I4891 Unspecified atrial fibrillation: Secondary | ICD-10-CM | POA: Diagnosis not present

## 2018-05-06 DIAGNOSIS — Z941 Heart transplant status: Secondary | ICD-10-CM | POA: Diagnosis not present

## 2018-05-06 DIAGNOSIS — I2510 Atherosclerotic heart disease of native coronary artery without angina pectoris: Secondary | ICD-10-CM | POA: Diagnosis not present

## 2018-05-06 DIAGNOSIS — I10 Essential (primary) hypertension: Secondary | ICD-10-CM | POA: Insufficient documentation

## 2018-05-06 DIAGNOSIS — E785 Hyperlipidemia, unspecified: Secondary | ICD-10-CM | POA: Diagnosis not present

## 2018-05-06 DIAGNOSIS — Z992 Dependence on renal dialysis: Secondary | ICD-10-CM | POA: Diagnosis not present

## 2018-05-06 DIAGNOSIS — I252 Old myocardial infarction: Secondary | ICD-10-CM | POA: Diagnosis not present

## 2018-05-06 DIAGNOSIS — R972 Elevated prostate specific antigen [PSA]: Secondary | ICD-10-CM | POA: Diagnosis not present

## 2018-05-06 DIAGNOSIS — Z7982 Long term (current) use of aspirin: Secondary | ICD-10-CM | POA: Insufficient documentation

## 2018-05-06 DIAGNOSIS — E669 Obesity, unspecified: Secondary | ICD-10-CM | POA: Insufficient documentation

## 2018-05-06 DIAGNOSIS — C61 Malignant neoplasm of prostate: Secondary | ICD-10-CM | POA: Insufficient documentation

## 2018-05-06 DIAGNOSIS — N186 End stage renal disease: Secondary | ICD-10-CM | POA: Insufficient documentation

## 2018-05-06 DIAGNOSIS — M129 Arthropathy, unspecified: Secondary | ICD-10-CM | POA: Insufficient documentation

## 2018-05-06 HISTORY — DX: Malignant neoplasm of prostate: C61

## 2018-05-06 NOTE — Telephone Encounter (Signed)
Per 9/10 no los 

## 2018-05-06 NOTE — Progress Notes (Signed)
Reason for the request: Prostate cancer  HPI: I was asked by Dr. Junious Silk  to evaluate [name] for prostate cancer.  He is a pleasant 57 year old man currently of [place] where he lived majority of his life.  He has a history of cardiomyopathy that required LVAD and subsequently received heart transplant in 2014.  He subsequently developed renal failure because of his rejection medication.  He was evaluated for renal transplant and underwent a weight reduction surgery prior to doing so.  In the interim he started developing a rise in his PSA which was up to 9.26 in July 2019.  He was evaluated by Dr. Junious Silk and a biopsy at that time showed a Gleason score 3+4 equal 7 and 2 cores with a Gleason score 3+3 equal 6 and one core.  Based on these findings, he was removed from the renal transplant list until his cancer is adequately treated.  He reports no major complaints.  He is anuric remains hemodialysis dependent.  He has lost close to 100 pounds and no longer considered diabetic.  He does not report any headaches, blurry vision, syncope or seizures. Does not report any fevers, chills or sweats.  Does not report any cough, wheezing or hemoptysis.  Does not report any chest pain, palpitation, orthopnea or leg edema.  Does not report any nausea, vomiting or abdominal pain.  Does not report any constipation or diarrhea.  Does not report any skeletal complaints.    Does not report frequency, urgency or hematuria.  Does not report any skin rashes or lesions. Does not report any heat or cold intolerance.  Does not report any lymphadenopathy or petechiae.  Does not report any anxiety or depression.  Remaining review of systems is negative.    Past Medical History:  Diagnosis Date  . Arthritis   . Atrial fibrillation ([place])   . CHF (congestive heart failure), NYHA class III (HCC)    s/p heart transplant  . Chronic kidney disease    end stage  . Chronic systolic dysfunction of left ventricle   . CVA  (cerebral infarction)   . Diabetes mellitus    PMH; Prior to heart transplant  . Family history of coronary artery disease    in both parents  . Hyperlipidemia   . Hypertension   . Left bundle branch block   . Morbid obesity ([place])    status post lap band  . Myocardial infarction [facility])    prior to heart transplant  . Nonischemic cardiomyopathy ([place])    prior to heart transplant  . Obesity (BMI 30-39.9)   . Obstructive sleep apnea    no longer needs CPAP after heart transplant per pt  . Premature ventricular contractions   . SOB (shortness of breath)   . Stroke [facility])   :  Past Surgical History:  Procedure Laterality Date  . CARDIAC PACEMAKER PLACEMENT  09/21/2009   Biventricular implantable cardioverter-defibrillator implantation     . COLONOSCOPY W/ BIOPSIES AND POLYPECTOMY    . FOOT SURGERY     left  . HEART TRANSPLANT  2014  . LAPAROSCOPIC GASTRIC BANDING  01/27/2007  . LEFT VENTRICULAR ASSIST DEVICE     implanted at [facility]  . TOTAL HIP ARTHROPLASTY Right 07/22/2017   Procedure: RIGHT TOTAL HIP ARTHROPLASTY ANTERIOR APPROACH;  Surgeon: Rod Can, MD;  Location: [facility];  Service: Orthopedics;  Laterality: Right;  Needs RNFA  . TOTAL KNEE ARTHROPLASTY Right 07/22/2017  :   Current Outpatient Medications:  .  aspirin 81  MG chewable tablet, Chew 1 tablet (81 mg total) by mouth 2 (two) times daily., Disp: 60 tablet, Rfl: 1 .  B Complex-C-Folic Acid (RENO CAPS) 1 MG CAPS, Take 1 capsule by mouth daily., Disp: , Rfl: 3 .  cephALEXin (KEFLEX) 500 MG capsule, Take 1 capsule (500 mg total) by mouth 3 (three) times daily., Disp: 30 capsule, Rfl: 1 .  cinacalcet (SENSIPAR) 30 MG tablet, Take 30 mg by mouth daily., Disp: , Rfl:  .  citalopram (CELEXA) 20 MG tablet, TAKE 1 TABLET(20 MG) BY MOUTH EVERY DAY, Disp: , Rfl:  .  Cyanocobalamin (NASCOBAL) 500 MCG/0.1ML SOLN, Place 500 mcg into the nose daily as needed (energy boost)., Disp: , Rfl:  .  cycloSPORINE modified (NEORAL) 100  MG capsule, Take 100 mg by mouth See admin instructions. Take one capsule (100 mg) by mouth twice daily with 3 capsules (75 mg) for a total dose of 175 mg twice daily (both modified), Disp: , Rfl:  .  cycloSPORINE modified (NEORAL) 25 MG capsule, Take 3 capsules (75 mg total) by mouth 2 (two) times daily. In combination with (Patient taking differently: Take 75 mg by mouth See admin instructions. Take 3 capsules (75 mg) by mouth twice daily with a 100 mg capsule for a total dose of 175 mg twice daily (both modified)), Disp: 180 capsule, Rfl: 0 .  docusate sodium (COLACE) 100 MG capsule, Take 1 capsule (100 mg total) by mouth 2 (two) times daily. (Patient taking differently: Take 100 mg by mouth 2 (two) times daily as needed (constipation). ), Disp: 60 capsule, Rfl: 1 .  HYDROcodone-acetaminophen (NORCO/VICODIN) 5-325 MG tablet, hydrocodone 5 mg-acetaminophen 325 mg tablet, Disp: , Rfl:  .  lidocaine (XYLOCAINE) 5 % ointment, Apply 1 application topically as needed. Apply to feet twice daily as needed., Disp: 50 g, Rfl: 5 .  lidocaine-prilocaine (EMLA) cream, APPLY SMALL AMOUNT TO ACCESS SITE (AVF) 1 TO 2 HOURS BEFORE DIALYSIS. COVER WITH OCCLUSIVE DRESSING (SARAN WRAP), Disp: , Rfl: 4 .  mycophenolate (MYFORTIC) 360 MG TBEC EC tablet, Take 720 mg by mouth 2 (two) times daily., Disp: , Rfl:  .  omeprazole (PRILOSEC) 20 MG capsule, TAKE 20 mg BY MOUTH DAILY, Disp: , Rfl:  .  ondansetron (ZOFRAN) 4 MG tablet, Take 1 tablet (4 mg total) by mouth every 6 (six) hours as needed for nausea., Disp: 20 tablet, Rfl: 0 .  oxyCODONE (OXY IR/ROXICODONE) 5 MG immediate release tablet, Take 1-2 tablets (5-10 mg total) by mouth every 6 (six) hours as needed for breakthrough pain., Disp: 60 tablet, Rfl: 0 .  polyethylene glycol (GAVILYTE-G) 236 g solution, GaviLyte-G 236 gram-22.74 gram-6.74 gram-5.86 gram oral solution, Disp: , Rfl:  .  pravastatin (PRAVACHOL) 40 MG tablet, Take 1 tablet (40 mg total) by mouth daily at 6  PM. (Patient taking differently: Take 40 mg by mouth at bedtime. ), Disp: 30 tablet, Rfl: 5 .  pregabalin (LYRICA) 100 MG capsule, Take 100 mg by mouth 3 (three) times daily., Disp: , Rfl:  .  senna (SENOKOT) 8.6 MG TABS tablet, Take 2 tablets (17.2 mg total) by mouth at bedtime., Disp: 120 each, Rfl: 0 .  sevelamer carbonate (RENVELA) 800 MG tablet, Take 800-2,400 mg by mouth See admin instructions. Take 2-3 tablets (1600-2400 mg) by mouth three times daily with meals, take 1 tablet (800 mg) with snacks, Disp: , Rfl:  .  tadalafil (CIALIS) 20 MG tablet, Take 20 mg by mouth daily as needed for erectile dysfunction., Disp: ,  Rfl:  .  VELPHORO 500 MG chewable tablet, CHEW & SWALLOW 2 TABLETS BY MOUTH THREE TIMES A DAY WITH MEALS, Disp: , Rfl: 3 .  venlafaxine (EFFEXOR) 37.5 MG tablet, Take half tablet x 1 month, then increase to 1 tablet daily.  On days of dialysis, take afterwards., Disp: 30 tablet, Rfl: 5:  Allergies  Allergen Reactions  . Heparin Nausea Only, Swelling and Other (See Comments)     * * HIT * * SWELLING REACTION UNSPECIFIED  DIAPHORESIS  . Penicillin G Potassium [Penicillin G] Nausea Only and Other (See Comments)    DIAPHORESIS  :  Family History  Problem Relation Age of Onset  . Coronary artery disease Father        had, PTCA & CABG  . Heart attack Father   . Hypertension Father   . Diabetes Father   . Coronary artery disease Mother   . Heart attack Mother   . Hypertension Mother   . Stroke Mother   . Lupus Mother   :  Social History   Socioeconomic History  . Marital status: Married    Spouse name: Not on file  . Number of children: 1  . Years of education: 4  . Highest education level: Not on file  Occupational History  . Occupation: retired    Fish farm manager: [employer]  . Financial resource strain: Not on file  . Food insecurity:    Worry: Not on file    Inability: Not on file  . Transportation needs:    Medical: Not on file     Non-medical: Not on file  Tobacco Use  . Smoking status: Never Smoker  . Smokeless tobacco: Former Systems developer    Types: Snuff  Substance and Sexual Activity  . Alcohol use: No  . Drug use: No  . Sexual activity: Not on file  Lifestyle  . Physical activity:    Days per week: Not on file    Minutes per session: Not on file  . Stress: Not on file  Relationships  . Social connections:    Talks on phone: Not on file    Gets together: Not on file    Attends religious service: Not on file    Active member of club or organization: Not on file    Attends meetings of clubs or organizations: Not on file    Relationship status: Not on file  . Intimate partner violence:    Fear of current or ex partner: Not on file    Emotionally abused: Not on file    Physically abused: Not on file    Forced sexual activity: Not on file  Other Topics Concern  . Not on file  Social History Narrative   Lives with wife in a one story home.  Has one child.     Retired from the [employer].  Supervisor over transportation.    Education: college.   :  Pertinent items are noted in HPI.  Exam: ECOG 1 General appearance: alert and cooperative appeared without distress. Head: atraumatic without any abnormalities. Eyes: conjunctivae/corneas clear. PERRL.  Sclera anicteric. Throat: lips, mucosa, and tongue normal; without oral thrush or ulcers. Resp: clear to auscultation bilaterally without rhonchi, wheezes or dullness to percussion. Cardio: regular rate and rhythm, S1, S2 normal, no murmur, click, rub or gallop GI: soft, non-tender; bowel sounds normal; no masses,  no organomegaly Skin: Skin color, texture, turgor normal. No rashes or lesions Lymph nodes: Cervical, supraclavicular, and  axillary nodes normal. Neurologic: Grossly normal without any motor, sensory or deep tendon reflexes. Musculoskeletal: No joint deformity or effusion.  CBC    Component Value Date/Time   WBC 11.5 (H) 08/24/2017 1804    RBC 3.17 (L) 08/24/2017 1804   HGB 9.2 (L) 08/24/2017 1819   HCT 27.0 (L) 08/24/2017 1819   PLT 224 08/24/2017 1804   MCV 92.4 08/24/2017 1804   MCH 29.7 08/24/2017 1804   MCHC 32.1 08/24/2017 1804   RDW 16.1 (H) 08/24/2017 1804   LYMPHSABS 1.2 08/24/2017 1804   MONOABS 0.5 08/24/2017 1804   EOSABS 0.1 08/24/2017 1804   BASOSABS 0.1 08/24/2017 1804     Chemistry      Component Value Date/Time   NA 138 08/24/2017 1819   K 4.7 08/24/2017 1819   CL 99 (L) 08/24/2017 1819   CO2 22 07/24/2017 0624   BUN 33 (H) 08/24/2017 1819   CREATININE 9.50 (H) 08/24/2017 1819      Component Value Date/Time   CALCIUM 8.4 (L) 07/24/2017 0624   ALKPHOS 50 09/06/2008 0415   AST 23 09/06/2008 0415   ALT 8 09/06/2008 0415   BILITOT 1.4 (H) 09/06/2008 0415         Assessment and Plan:    57 year old man with prostate cancer diagnosed in July 2019.  His Gleason score is 3+4 equal 7 and 2 cores with 1 of them is 70% and a Gleason score 3+3 equal 6 and 2 other cores.  The natural course of this disease was discussed today with the patient in detail.  Discussed in the prostate cancer multidisciplinary clinic including reviewing his pathology slides with the discussing pathologist.  Treatment options at this time which include primary surgical therapy versus definitive radiation therapy.  Given his low intermediate risk at this time androgen deprivation therapy would not be required.  The rationale and risks and benefits of both approaches were reviewed today with the patient in detail.  Both approaches will provide adequate disease control and potential cure however the critical part at this time would be to not compromise his risk of receiving renal transplant in the future.  Surgery my offer increased risk of potential scar tissues and with complicate his renal transplant surgery in the future.  Radiation therapy my offer less potential complication and will give him reasonable chance of cancer cure  and allow him to receive renal transplant in the future.  It is critical not to compromise his ability to receive renal transplant as it will dictate his longevity more than his prostate cancer which is low intermediate risk at this time.  All his questions were answered today to his satisfaction.   30  minutes was spent with the patient face-to-face today.  More than 50% of time was dedicated to patient counseling, education and discussing the natural course of his disease and treatment options.      Thank you for the referral.  A copy of this consult has been forwarded to the requesting physician.

## 2018-05-06 NOTE — Progress Notes (Signed)
                               Care Plan Summary  Name: [name] DOB: December 11, 1960   Your Medical Team:   Urologist -  Dr. Raynelle Bring, [facility]  Radiation Oncologist - Dr. Tyler Pita, [facility]   Medical Oncologist - Dr. Zola Button, [facility]  Recommendations: 1) 5 1/2 weeks of external radiation     * These recommendations are based on information available as of today's consult.      Recommendations may change depending on the results of further tests or exams.  Next Steps: 1) Dr. Lyndal Rainbow office will schedule placement of gold markers and SpaceOAR 2) Dr. Johny Shears office will schedule radiation planning and treatments.   When appointments need to be scheduled, you will be contacted by [facility] and/or [facility].  Questions?  Please do not hesitate to call [name] Rue, RN, BSN, OCN at [phone] any questions or concerns.  [name] is your Oncology Nurse Navigator and is available to assist you while you're receiving your medical care at [facility].

## 2018-05-06 NOTE — Consult Note (Signed)
[facility]     05/01/2018     --------------------------------------------------------------------------------     Dixie Dials. [place]   MRN: [id number]  PRIMARY CARE:  Judithann Sauger, MD   DOB: [birth year], 57 year old Male  REFERRING:  Judithann Sauger, MD   SSN: [id number]  PROVIDER:  [name], M.D.     TREATING:  Raynelle Bring, M.D.     LOCATION:  [facility] [phone]     --------------------------------------------------------------------------------     CC/HPI: CC: Prostate Cancer     Physician requesting consult: Dr. Eda Keys   PCP: Dr. Carol Ada   Location of consult: [facility]     Mr. Colmenares is a 57 year old gentleman with a complex medical history. He underwent an orthotopic heart transplant in July 2014 due to heart failure related to non-ischemic cardiomyopathy. This was complicated by heparin-induced thrombocytopenia. He subsequently developed ESRD and began hemodialysis in December 2014 due to calcineurin toxicity. He is currently managed with immunosupression including mycophenolate and cyclosporine. He was evaluated by the [facility] renal transplant team and placed on the renal transplant list there but was made inactive in 2015 due to obesity (BMI > 40). He has undergone a sleeve gastrectomy by bariatric surgery at [facility] and was hoping to be made active but was noted to have a rising PSA, poor performance status, and persistent pannus due to obesity. His PSA had increased from 6.4 to 8.1. He was seen by Dr. Hillis Range who recommended continued surveillance monitoring with plans to proceed with renal transplant in the meantime. He sought a second opinion for his elevated PSA this summer and was seen by Dr. Eda Keys. He was noted to have a further increase of his PSA to 9.26 and right mid prostate nodule. He underwent a TRUS biopsy of the prostate on 03/25/18. This  demonstrated Gleason 3+4=7 adenocarcinoma with 4 out of 12 biopsy cores positive (all right sided) correlating with his exam.       Family history: He has a father and brother with a history of prostate cancer.     Imaging studies: None.     PMH: He has a history of non-ischemic cardiomyopathy s/p heart transplant (most recent echocardiogram March 2019 EF 55%) followed at [facility], diabetes, history of TIA, hypertension, morbid obesity.   PSH: Sleeve gastrectomy, heart transplant, right THA     TNM stage: cT2a Nx Mx   PSA: 9.26   Gleason score: 3+4=7   Biopsy (03/25/18): 4/12 cores positive   Left: Benign   Right: R lateral apex (10%, 3+4=7), R mid (30%, 3+3=6), R lateral mid (70%, 3+4=7), R lateral base (5%, 3+3=6)   Prostate volume: 73 cc   PSAD: 0.13     Nomogram   OC disease: 40%   EPE: 59%   SVI: 4%   LNI: 5%   PFS (5 year, 10 year): 77%, 64%     Urinary function: IPSS is 0. He is anuric   Erectile function: SHIM score is 5. He has refractory ED and has failed PDE-5 inhibitors. He has previously tried ICI.        ALLERGIES: Heparin       MEDICATIONS: Aspirin Ec 81 mg tablet, delayed release Oral   B Complex With Vitamin C   Citalopram Hbr 20 mg tablet Oral   Docusate Sodium 144 mg capsule   Folic Acid   Lyrica 315 mg capsule  Mycophenolic Acid 174 mg tablet, delayed release   Omeprazole 20 mg tablet, delayed release Oral   Ondansetron Odt 4 mg tablet,disintegrating   Oxycodone Hcl 5 mg tablet   Pravastatin Sodium 20 mg tablet Oral   Reno Caps 1 mg capsule   Renvela 800 mg tablet   Senna 8.6 mg tablet   Temazepam 15 mg capsule Oral   Valtrex 500 mg tablet   Venlafaxine Hcl 37.5 mg tablet        GU PSH: Prostate Needle Biopsy - 03/25/2018         PSH Notes: Heart Surgery, gastric lap band     NON-GU PSH: Surgical Pathology, Gross And Microscopic Examination For Prostate Needle - 03/25/2018  Total Hip Arthroplasty, Right  Transplant Heart       GU PMH:  Prostate Cancer - 04/22/2018  Elevated PSA - 03/25/2018, - 01/14/2018  ED due to arterial insufficiency - 01/14/2018, Erectile dysfunction due to arterial insufficiency, - 2016  Personal Hx Oth Urinary System diseases, History of renal failure - 2016         PMH Notes: kidney failure     NON-GU PMH: Encounter for general adult medical examination without abnormal findings, Encounter for preventive health examination - 2016  Myocardial Infarction, History of myocardial infarction - 2016  Personal history of other diseases of the circulatory system, History of congestive heart disease - 2016, History of heart failure, - 2016  Personal history of transient ischemic attack (TIA), and cerebral infarction without residual deficits, History of stroke - 2016  Diabetes Type 2  Hypertension  Other heart failure       FAMILY HISTORY: cardiac disorder - Runs In Family  Death of family member - Mother, Father     Notes: 1 daughter     SOCIAL HISTORY: Marital Status: Married  Preferred Language: English; Ethnicity: Not Hispanic Or Latino; Race: Black or African American  Current Smoking Status: Patient has never smoked.     Tobacco Use Assessment Completed: Used Tobacco in last 30 days?  Does not use smokeless tobacco.  Drinks 1 drink per day.   Drinks 1 caffeinated drink per day.  Patient's occupation is/was disabled.       Notes: No alcohol use, Occupation, Caffeine use, Number of children, Death in the family, mother, Death in the family, father, Never a smoker     REVIEW OF SYSTEMS:     GU Review Male:   Patient denies frequent urination, hard to postpone urination, burning/ pain with urination, get up at night to urinate, leakage of urine, stream starts and stops, trouble starting your streams, and have to strain to urinate .   Gastrointestinal (Upper):   Patient denies nausea and vomiting.   Gastrointestinal (Lower):   Patient denies diarrhea and constipation.   Constitutional:   Patient  denies fever, night sweats, weight loss, and fatigue.   Skin:   Patient denies skin rash/ lesion and itching.   Eyes:   Patient denies blurred vision and double vision.   Ears/ Nose/ Throat:   Patient denies sore throat and sinus problems.   Hematologic/Lymphatic:   Patient denies swollen glands and easy bruising.   Cardiovascular:   Patient denies leg swelling and chest pains.   Respiratory:   Patient denies cough and shortness of breath.   Endocrine:   Patient denies excessive thirst.   Musculoskeletal:   Patient denies back pain and joint pain.   Neurological:   Patient denies headaches and dizziness.   Psychologic:  Patient denies depression and anxiety.     VITAL SIGNS: None     MULTI-SYSTEM PHYSICAL EXAMINATION:     Constitutional: Well-nourished. No physical deformities. Normally developed. Good grooming.        PAST DATA REVIEWED:   Source Of History:  Patient   Lab Test Review:   PSA   Records Review:   Pathology Reports, Previous Patient Records   Urine Test Review:   Urinalysis     PROCEDURES: None     ASSESSMENT:       ICD-10 Details   1 GU:   Prostate Cancer - C61      PLAN:             Document  Letter(s):  Created for Patient: Clinical Summary            Notes:   1. Prostate cancer: I had a detailed discussion with Mr. Austad and his wife today and discussed his options for management of his favorable intermediate risk prostate cancer in the setting of his pending evaluation for renal transplantation. We discussed his disease in the context of his life expectancy and his medical comorbidities. The patient was counseled about the natural history of prostate cancer and the standard treatment options that are available for prostate cancer. It was explained to him how his age and life expectancy, clinical stage, Gleason score, and PSA affect his prognosis, the decision to proceed with additional staging studies, as well as how that information influences recommended  treatment strategies. We discussed the roles for active surveillance, radiation therapy, surgical therapy, androgen deprivation, as well as ablative therapy options for the treatment of prostate cancer as appropriate to his individual cancer situation. We discussed the risks and benefits of these options with regard to their impact on cancer control and also in terms of potential adverse events, complications, and impact on quality of life particularly related to urinary and sexual function. The patient was encouraged to ask questions throughout the discussion today and all questions were answered to his stated satisfaction. In addition, the patient was provided with and/or directed to appropriate resources and literature for further education about prostate cancer and treatment options.     Unfortunately, his prostate cancer will need treatment and his diagnosis will likely delay his candidacy for being active on the transplant list. We did review options for treatment and specifically focused our discussion on the pros and cons of surgical therapy and radiation therapy. With regard to surgery, we did discuss his immunosuppresion, his obesity, and the potential for complicating subsequent pelvic surgery for his renal transplant along with advantages of pathologic staging of his malignancy. With regard to radiation therapy, we discussed the uncertainty of his baseline urinary symptoms as he has been anuric for almost 5 years. We did discuss the potential role of pre-treatment urodynamics.     He did meet with Dr. Alen Blew and Dr. Tammi Klippel today as well in the multidisciplinary clinic. Ultimately, he wishes to consider IMRT (hypofractionated) for 5 plus weeks and would like to proceed in [place] for convenience. Dr. Tammi Klippel will coordinate his treatment with Dr. Junious Silk and will communicate with his renal transplant coordinator regarding his recent diagnosis and choice for treatment. Mr. Christina understands that  while this diagnosis will delay his candidacy for renal transplantation, he will need to discuss exactly how long this would delay him from being a candidate. He is already on immunosuppression for his heart transplant regardless but he understands that he will need a  period of being disease-free (possibly up to 5 years) before being considered for a transplant.     cc: Dr. Eda Keys   Dr. Ledon Snare   Dr. Zola Button   Dr. Carol Ada                E & M CODE: I spent at least 42 minutes face to face with the patient, more than 50% of that time was spent on counseling and/or coordinating care.

## 2018-05-06 NOTE — Progress Notes (Signed)
Radiation Oncology         [phone] ________________________________  Multidisciplinary Prostate Cancer Clinic  Initial Radiation Oncology Consultation  Name: [name] MRN: [id number]  Date: 05/06/2018  DOB: July 13, 1961  QP:[id number], Hal Hope, MD  Raynelle Bring, MD   REFERRING PHYSICIAN: Raynelle Bring, MD  DIAGNOSIS: 57 y.o. gentleman with stage T2a adenocarcinoma of the prostate with a Gleason's score of 3+4 and a PSA of 9.26    ICD-10-CM   1. Malignant neoplasm of prostate ([place]) [place] is a 57 y.o. gentleman with a complex history of heart transplant in 2014 with subsequent end-stage renal disease requiring hemodialysis, secondary to drug toxicity, and awaiting potential renal transplant once he is deemed to be cancer free.  He remains on immunosuppression to prevent transplant rejection s/p heart transplant.  He has a longstanding history of elevated PSA and ED, followed by Dr. Junious Silk at [facility].  He has also previously been evaluated with Dr. Hillis Range at [facility] urology in September 2018 and more recently in May 2019.  Recently, he was noted to have an elevated PSA of 9.26 by his physician at the [facility] transplant clinic, Dr. Christa See.  Accordingly, he was referred back for evaluation in urology by Dr. Junious Silk on 01/14/18, digital rectal examination performed at that time revealed a concerning prostate nodule.  The patient proceeded to transrectal ultrasound with 12 biopsies of the prostate on 03/25/18.  The prostate volume measured 73 g.  Out of 12 core biopsies, 4 were positive.  The maximum Gleason score was 3+4=7, and this was seen in right mid lateral and right apex lateral.  Additionally, there was Gleason 3+3 disease in the right base lateral and right mid gland.  PSA trend: May 2016 PSA 3.54 May 2017 PSA 5.9 Feb. 2018 PSA 8.1 Sept. 2018 PSA 7.25 October 2017 PSA 9.26  The patient reviewed the biopsy results with his  urologist and he has kindly been referred today to the multidisciplinary prostate cancer clinic for presentation of pathology and radiology studies in our conference for discussion of potential radiation treatment options and clinical evaluation.     PREVIOUS RADIATION THERAPY: No  PAST MEDICAL HISTORY:  has a past medical history of Arthritis, Atrial fibrillation ([place]), CHF (congestive heart failure), NYHA class III ([place]), Chronic kidney disease, Chronic systolic dysfunction of left ventricle, CVA (cerebral infarction), Diabetes mellitus, Family history of coronary artery disease, Hyperlipidemia, Hypertension, Left bundle branch block, Morbid obesity ([place]), Myocardial infarction ([place]), Nonischemic cardiomyopathy ([place]), Obesity (BMI 30-39.9), Obstructive sleep apnea, Premature ventricular contractions, Prostate cancer ([place]), SOB (shortness of breath), and Stroke ([place]).    PAST SURGICAL HISTORY: Past Surgical History:  Procedure Laterality Date  . CARDIAC PACEMAKER PLACEMENT  09/21/2009   Biventricular implantable cardioverter-defibrillator implantation     . COLONOSCOPY W/ BIOPSIES AND POLYPECTOMY    . FOOT SURGERY     left  . HEART TRANSPLANT  2014  . LAPAROSCOPIC GASTRIC BANDING  01/27/2007  . LEFT VENTRICULAR ASSIST DEVICE     implanted at [facility]  . PROSTATE BIOPSY    . TOTAL HIP ARTHROPLASTY Right 07/22/2017   Procedure: RIGHT TOTAL HIP ARTHROPLASTY ANTERIOR APPROACH;  Surgeon: Rod Can, MD;  Location: [facility];  Service: Orthopedics;  Laterality: Right;  Needs RNFA  . TOTAL KNEE ARTHROPLASTY Right 07/22/2017    FAMILY HISTORY: family history includes Coronary artery disease in his father and mother; Diabetes in his father; Heart attack in his father  and mother; Hypertension in his father and mother; Lupus in his mother; Prostate cancer in his brother and father; Stroke in his mother.   SOCIAL HISTORY:  reports that he has never smoked. He has quit using smokeless tobacco.  His  smokeless tobacco use included snuff. He reports that he does not drink alcohol or use drugs.  ALLERGIES: Heparin and Penicillin g potassium [penicillin g]  MEDICATIONS:  Current Outpatient Medications  Medication Sig Dispense Refill  . aspirin 81 MG chewable tablet Chew 1 tablet (81 mg total) by mouth 2 (two) times daily. 60 tablet 1  . B Complex-C-Folic Acid ([place]ENO CAPS) 1 MG CAPS Take 1 capsule by mouth daily.  3  . cephALEXin (KEFLEX) 500 MG capsule Take 1 capsule (500 mg total) by mouth 3 (three) times daily. 30 capsule 1  . cinacalcet (SENSIPAR) 30 MG tablet Take 30 mg by mouth daily.    . citalopram (CELEXA) 20 MG tablet TAKE 1 TABLET(20 MG) BY MOUTH EVERY DAY    . Cyanocobalamin (NASCOBAL) 500 MCG/0.1ML SOLN Place 500 mcg into the nose daily as needed (energy boost).    . cycloSPORINE modified (NEORAL) 100 MG capsule Take 100 mg by mouth See admin instructions. Take one capsule (100 mg) by mouth twice daily with 3 capsules (75 mg) for a total dose of 175 mg twice daily (both modified)    . cycloSPORINE modified (NEORAL) 25 MG capsule Take 3 capsules (75 mg total) by mouth 2 (two) times daily. In combination with (Patient taking differently: Take 75 mg by mouth See admin instructions. Take 3 capsules (75 mg) by mouth twice daily with a 100 mg capsule for a total dose of 175 mg twice daily (both modified)) 180 capsule 0  . docusate sodium (COLACE) 100 MG capsule Take 1 capsule (100 mg total) by mouth 2 (two) times daily. (Patient taking differently: Take 100 mg by mouth 2 (two) times daily as needed (constipation). ) 60 capsule 1  . HYDROcodone-acetaminophen (NORCO/VICODIN) 5-325 MG tablet hydrocodone 5 mg-acetaminophen 325 mg tablet    . lidocaine (XYLOCAINE) 5 % ointment Apply 1 application topically as needed. Apply to feet twice daily as needed. 50 g 5  . lidocaine-prilocaine (EMLA) cream APPLY SMALL AMOUNT TO ACCESS SITE (AVF) 1 TO 2 HOURS BEFORE DIALYSIS. COVER WITH OCCLUSIVE DRESSING  (SARAN WRAP)  4  . mycophenolate (MYFORTIC) 360 MG TBEC EC tablet Take 720 mg by mouth 2 (two) times daily.    Marland Kitchen omeprazole (PRILOSEC) 20 MG capsule TAKE 20 mg BY MOUTH DAILY    . ondansetron (ZOFRAN) 4 MG tablet Take 1 tablet (4 mg total) by mouth every 6 (six) hours as needed for nausea. 20 tablet 0  . oxyCODONE (OXY IR/ROXICODONE) 5 MG immediate release tablet Take 1-2 tablets (5-10 mg total) by mouth every 6 (six) hours as needed for breakthrough pain. 60 tablet 0  . polyethylene glycol (GAVILYTE-G) 236 g solution GaviLyte-G 236 gram-22.74 gram-6.74 gram-5.86 gram oral solution    . pravastatin (PRAVACHOL) 40 MG tablet Take 1 tablet (40 mg total) by mouth daily at 6 PM. (Patient taking differently: Take 40 mg by mouth at bedtime. ) 30 tablet 5  . pregabalin (LYRICA) 100 MG capsule Take 100 mg by mouth 3 (three) times daily.    Marland Kitchen senna (SENOKOT) 8.6 MG TABS tablet Take 2 tablets (17.2 mg total) by mouth at bedtime. 120 each 0  . sevelamer carbonate (RENVELA) 800 MG tablet Take 800-2,400 mg by mouth See admin instructions. Take  2-3 tablets (1600-2400 mg) by mouth three times daily with meals, take 1 tablet (800 mg) with snacks    . tadalafil (CIALIS) 20 MG tablet Take 20 mg by mouth daily as needed for erectile dysfunction.    . VELPHORO 500 MG chewable tablet CHEW & SWALLOW 2 TABLETS BY MOUTH THREE TIMES A DAY WITH MEALS  3  . venlafaxine (EFFEXOR) 37.5 MG tablet Take half tablet x 1 month, then increase to 1 tablet daily.  On days of dialysis, take afterwards. 30 tablet 5   No current facility-administered medications for this encounter.     REVIEW OF SYSTEMS:  On review of systems, the patient reports that he is doing well overall. He denies any chest pain, shortness of breath, cough, fevers, chills, night sweats, unintended weight changes. He denies any bowel disturbances, and denies abdominal pain, nausea or vomiting. He denies any new musculoskeletal or joint aches or pains. He has ESRD and  is anuric. He is currently on hemodialysis every Mon, Wed, and Fri mornings. He is currently followed by the transplant team at [facility] for consideration of renal transplant once he is rendered cancer free. He has severe ED and is almost never able to complete sexual activity. A complete review of systems is obtained and is otherwise negative.   PHYSICAL EXAM:  Wt Readings from Last 3 Encounters:  05/06/18 (!) 317 lb 6.4 oz (144 kg)  08/24/17 (!) 318 lb (144.2 kg)  07/24/17 (!) 313 lb 11.4 oz (142.3 kg)   Temp Readings from Last 3 Encounters:  05/06/18 97.9 F (36.6 C) (Oral)  08/25/17 97.8 F (36.6 C) (Oral)  07/24/17 99.4 F (37.4 C) (Oral)   BP Readings from Last 3 Encounters:  05/06/18 114/62  08/25/17 94/62  07/24/17 (!) 91/46   Pulse Readings from Last 3 Encounters:  05/06/18 87  08/25/17 71  07/24/17 85   Pain Assessment Pain Score: 0-No pain/10  In general this is a well appearing African American gentleman in no acute distress. He is alert and oriented x4 and appropriate throughout the examination. HEENT reveals that the patient is normocephalic, atraumatic. EOMs are intact. PERRLA. Skin is intact without any evidence of gross lesions. Cardiovascular exam reveals a regular rate and rhythm, no clicks rubs or murmurs are auscultated. Chest is clear to auscultation bilaterally. Lymphatic assessment is performed and does not reveal any adenopathy in the cervical, supraclavicular, axillary, or inguinal chains. Abdomen has active bowel sounds in all quadrants and is intact. The abdomen is obese, soft, non tender, non distended. Lower extremities are negative for pretibial pitting edema, deep calf tenderness, cyanosis or clubbing.  KPS = 90  100 - Normal; no complaints; no evidence of disease. 90   - Able to carry on normal activity; minor signs or symptoms of disease. 80   - Normal activity with effort; some signs or symptoms of disease. 19   - Cares for self; unable to carry on  normal activity or to do active work. 60   - Requires occasional assistance, but is able to care for most of his personal needs. 50   - Requires considerable assistance and frequent medical care. 74   - Disabled; requires special care and assistance. 50   - Severely disabled; hospital admission is indicated although death not imminent. 26   - Very sick; hospital admission necessary; active supportive treatment necessary. 10   - Moribund; fatal processes progressing rapidly. 0     - Dead  Karnofsky DA, [place],  Craver LS and Burchenal JH (1948) The use of the nitrogen mustards in the palliative treatment of carcinoma: with particular reference to bronchogenic carcinoma Cancer 1 634-56   LABORATORY DATA:  Lab Results  Component Value Date   WBC 11.5 (H) 08/24/2017   HGB 9.2 (L) 08/24/2017   HCT 27.0 (L) 08/24/2017   MCV 92.4 08/24/2017   PLT 224 08/24/2017   Lab Results  Component Value Date   NA 138 08/24/2017   K 4.7 08/24/2017   CL 99 (L) 08/24/2017   CO2 22 07/24/2017   Lab Results  Component Value Date   ALT 8 09/06/2008   AST 23 09/06/2008   ALKPHOS 50 09/06/2008   BILITOT 1.4 (H) 09/06/2008     RADIOGRAPHY: No results found.    IMPRESSION/PLAN: 57 y.o. gentleman with an intermediate risk, stage T2a adenocarcinoma of the prostate with a PSA of 9.26 and a Gleason score of 3+4.    We discussed the patient's workup and outlines the nature of prostate cancer in this setting. The patient's T stage, Gleason's score, and PSA put him into the favorable intermediate risk group. Accordingly, he is eligible for a variety of potential treatment options including brachytherapy, 5.5 weeks of external radiation or 5 weeks of external radiation followed by a brachytherapy boost. We discussed the available radiation techniques, and focused on the details and logistics and delivery. We also discussed the role of SpaceOAR in reducing the rectal toxicity associated with radiotherapy. The  patient may not be an ideal candidate for brachytherapy with a prostate volume of 73 g prior to downsizing from hormone therapy as well as his multiple medical comorbidities including obesity, end-stage renal disease and history of cardiac transplant.  We discussed and outlined the risks, benefits, short and long-term effects associated with radiotherapy and compared and contrasted these with prostatectomy.  He is not felt to be an ideal surgical candidate based on his multiple medical comorbidities as above.  We discussed the recommendation to proceed with a 5-1/2-week course of external beam radiotherapy and the patient was encouraged to ask questions that were answered to his stated satisfaction.  He appears to have a good understanding of his overall disease and our treatment recommendations which are of curative intent.    At the end of the conversation the patient is interested in moving forward with 5.5 weeks of external beam therapy. He has not had placement of gold fiducial markers. We will share our discussion with Dr. Junious Silk and move forward with scheduling placement of 3 gold fiducial markers into the prostate with SpaceOAR prior to simulation in preparation to proceed with IMRT in the near future.  We enjoyed meeting the patient and his wife today and look forward to participating in the care of this very nice gentleman.   Nicholos Johns, PA-C    Tyler Pita, MD  [facility] Direct Dial: [phone]  Fax: [phone] [email or web address]  Skype  LinkedIn  This document serves as a record of services personally performed by [employer], PA-C and Dr. Tammi Klippel. It was created on their behalf by Wilburn Mylar, a trained medical scribe. The creation of this record is based on the scribe's personal observations and the provider's statements to them. This document has been checked and approved by the attending provider.

## 2018-05-07 DIAGNOSIS — E1129 Type 2 diabetes mellitus with other diabetic kidney complication: Secondary | ICD-10-CM | POA: Diagnosis not present

## 2018-05-07 DIAGNOSIS — N2581 Secondary hyperparathyroidism of renal origin: Secondary | ICD-10-CM | POA: Diagnosis not present

## 2018-05-07 DIAGNOSIS — D509 Iron deficiency anemia, unspecified: Secondary | ICD-10-CM | POA: Diagnosis not present

## 2018-05-07 DIAGNOSIS — N186 End stage renal disease: Secondary | ICD-10-CM | POA: Diagnosis not present

## 2018-05-07 DIAGNOSIS — Z992 Dependence on renal dialysis: Secondary | ICD-10-CM | POA: Diagnosis not present

## 2018-05-07 DIAGNOSIS — D631 Anemia in chronic kidney disease: Secondary | ICD-10-CM | POA: Diagnosis not present

## 2018-05-07 NOTE — Progress Notes (Signed)
[facility] Psychosocial Distress Screening Spiritual Care  Shadowed by counseling intern [name], met with [name] and his wife in [facility] to introduce [name] team/resources, reviewing distress screen per protocol.  The patient scored a 8 on the Psychosocial Distress Thermometer which indicates severe distress. Also assessed for distress and other psychosocial needs.   ONCBCN DISTRESS SCREENING 05/07/2018  Screening Type Initial Screening  Distress experienced in past week (1-10) 8  Family Problem type Other (comment)  Emotional problem type Depression;Nervousness/Anxiety;Feeling hopeless  Spiritual/Religous concerns type Facing my mortality  Physical Problem type Pain;Sleep/insomnia;Loss of appetitie;Changes in urination;Tingling hands/feet;Sexual problems;Skin dry/itchy;Swollen arms/legs  Referral to support programs Yes   Couple plans to attend Prostate Cancer Support Group as a start to processing pt's dx. They report reduced distress since attending PMDC and meeting care team. Couple/spouse also verbalized interest in joint/individual counseling as an additional layer of support and knows how to f/u as desired.    Follow up needed: No. Per couple, no other needs at this time, but they are aware of ongoing [name] team/programming resources, including Spiritual Care for explorations of mortality, faith, and meaning.   [place], [place], [facility] Pager [phone] Voicemail [phone]

## 2018-05-09 DIAGNOSIS — E1129 Type 2 diabetes mellitus with other diabetic kidney complication: Secondary | ICD-10-CM | POA: Diagnosis not present

## 2018-05-09 DIAGNOSIS — N186 End stage renal disease: Secondary | ICD-10-CM | POA: Diagnosis not present

## 2018-05-09 DIAGNOSIS — D631 Anemia in chronic kidney disease: Secondary | ICD-10-CM | POA: Diagnosis not present

## 2018-05-09 DIAGNOSIS — N2581 Secondary hyperparathyroidism of renal origin: Secondary | ICD-10-CM | POA: Diagnosis not present

## 2018-05-09 DIAGNOSIS — D509 Iron deficiency anemia, unspecified: Secondary | ICD-10-CM | POA: Diagnosis not present

## 2018-05-09 DIAGNOSIS — Z992 Dependence on renal dialysis: Secondary | ICD-10-CM | POA: Diagnosis not present

## 2018-05-12 DIAGNOSIS — Z992 Dependence on renal dialysis: Secondary | ICD-10-CM | POA: Diagnosis not present

## 2018-05-12 DIAGNOSIS — E1129 Type 2 diabetes mellitus with other diabetic kidney complication: Secondary | ICD-10-CM | POA: Diagnosis not present

## 2018-05-12 DIAGNOSIS — D509 Iron deficiency anemia, unspecified: Secondary | ICD-10-CM | POA: Diagnosis not present

## 2018-05-12 DIAGNOSIS — N2581 Secondary hyperparathyroidism of renal origin: Secondary | ICD-10-CM | POA: Diagnosis not present

## 2018-05-12 DIAGNOSIS — D631 Anemia in chronic kidney disease: Secondary | ICD-10-CM | POA: Diagnosis not present

## 2018-05-12 DIAGNOSIS — N186 End stage renal disease: Secondary | ICD-10-CM | POA: Diagnosis not present

## 2018-05-13 DIAGNOSIS — Z5111 Encounter for antineoplastic chemotherapy: Secondary | ICD-10-CM | POA: Diagnosis not present

## 2018-05-13 DIAGNOSIS — C61 Malignant neoplasm of prostate: Secondary | ICD-10-CM | POA: Diagnosis not present

## 2018-05-14 DIAGNOSIS — N186 End stage renal disease: Secondary | ICD-10-CM | POA: Diagnosis not present

## 2018-05-14 DIAGNOSIS — D509 Iron deficiency anemia, unspecified: Secondary | ICD-10-CM | POA: Diagnosis not present

## 2018-05-14 DIAGNOSIS — D631 Anemia in chronic kidney disease: Secondary | ICD-10-CM | POA: Diagnosis not present

## 2018-05-14 DIAGNOSIS — E1129 Type 2 diabetes mellitus with other diabetic kidney complication: Secondary | ICD-10-CM | POA: Diagnosis not present

## 2018-05-14 DIAGNOSIS — N2581 Secondary hyperparathyroidism of renal origin: Secondary | ICD-10-CM | POA: Diagnosis not present

## 2018-05-14 DIAGNOSIS — Z992 Dependence on renal dialysis: Secondary | ICD-10-CM | POA: Diagnosis not present

## 2018-05-16 DIAGNOSIS — D631 Anemia in chronic kidney disease: Secondary | ICD-10-CM | POA: Diagnosis not present

## 2018-05-16 DIAGNOSIS — N186 End stage renal disease: Secondary | ICD-10-CM | POA: Diagnosis not present

## 2018-05-16 DIAGNOSIS — Z992 Dependence on renal dialysis: Secondary | ICD-10-CM | POA: Diagnosis not present

## 2018-05-16 DIAGNOSIS — N2581 Secondary hyperparathyroidism of renal origin: Secondary | ICD-10-CM | POA: Diagnosis not present

## 2018-05-16 DIAGNOSIS — E1129 Type 2 diabetes mellitus with other diabetic kidney complication: Secondary | ICD-10-CM | POA: Diagnosis not present

## 2018-05-16 DIAGNOSIS — D509 Iron deficiency anemia, unspecified: Secondary | ICD-10-CM | POA: Diagnosis not present

## 2018-05-19 DIAGNOSIS — E1129 Type 2 diabetes mellitus with other diabetic kidney complication: Secondary | ICD-10-CM | POA: Diagnosis not present

## 2018-05-19 DIAGNOSIS — D631 Anemia in chronic kidney disease: Secondary | ICD-10-CM | POA: Diagnosis not present

## 2018-05-19 DIAGNOSIS — Z992 Dependence on renal dialysis: Secondary | ICD-10-CM | POA: Diagnosis not present

## 2018-05-19 DIAGNOSIS — N2581 Secondary hyperparathyroidism of renal origin: Secondary | ICD-10-CM | POA: Diagnosis not present

## 2018-05-19 DIAGNOSIS — N186 End stage renal disease: Secondary | ICD-10-CM | POA: Diagnosis not present

## 2018-05-19 DIAGNOSIS — D509 Iron deficiency anemia, unspecified: Secondary | ICD-10-CM | POA: Diagnosis not present

## 2018-05-21 DIAGNOSIS — E1129 Type 2 diabetes mellitus with other diabetic kidney complication: Secondary | ICD-10-CM | POA: Diagnosis not present

## 2018-05-21 DIAGNOSIS — D509 Iron deficiency anemia, unspecified: Secondary | ICD-10-CM | POA: Diagnosis not present

## 2018-05-21 DIAGNOSIS — D631 Anemia in chronic kidney disease: Secondary | ICD-10-CM | POA: Diagnosis not present

## 2018-05-21 DIAGNOSIS — N186 End stage renal disease: Secondary | ICD-10-CM | POA: Diagnosis not present

## 2018-05-21 DIAGNOSIS — N2581 Secondary hyperparathyroidism of renal origin: Secondary | ICD-10-CM | POA: Diagnosis not present

## 2018-05-21 DIAGNOSIS — Z992 Dependence on renal dialysis: Secondary | ICD-10-CM | POA: Diagnosis not present

## 2018-05-23 DIAGNOSIS — E1129 Type 2 diabetes mellitus with other diabetic kidney complication: Secondary | ICD-10-CM | POA: Diagnosis not present

## 2018-05-23 DIAGNOSIS — D631 Anemia in chronic kidney disease: Secondary | ICD-10-CM | POA: Diagnosis not present

## 2018-05-23 DIAGNOSIS — D509 Iron deficiency anemia, unspecified: Secondary | ICD-10-CM | POA: Diagnosis not present

## 2018-05-23 DIAGNOSIS — Z992 Dependence on renal dialysis: Secondary | ICD-10-CM | POA: Diagnosis not present

## 2018-05-23 DIAGNOSIS — N186 End stage renal disease: Secondary | ICD-10-CM | POA: Diagnosis not present

## 2018-05-23 DIAGNOSIS — N2581 Secondary hyperparathyroidism of renal origin: Secondary | ICD-10-CM | POA: Diagnosis not present

## 2018-05-26 DIAGNOSIS — D631 Anemia in chronic kidney disease: Secondary | ICD-10-CM | POA: Diagnosis not present

## 2018-05-26 DIAGNOSIS — N186 End stage renal disease: Secondary | ICD-10-CM | POA: Diagnosis not present

## 2018-05-26 DIAGNOSIS — D509 Iron deficiency anemia, unspecified: Secondary | ICD-10-CM | POA: Diagnosis not present

## 2018-05-26 DIAGNOSIS — Z992 Dependence on renal dialysis: Secondary | ICD-10-CM | POA: Diagnosis not present

## 2018-05-26 DIAGNOSIS — E1129 Type 2 diabetes mellitus with other diabetic kidney complication: Secondary | ICD-10-CM | POA: Diagnosis not present

## 2018-05-26 DIAGNOSIS — N2581 Secondary hyperparathyroidism of renal origin: Secondary | ICD-10-CM | POA: Diagnosis not present

## 2018-05-27 DIAGNOSIS — Z992 Dependence on renal dialysis: Secondary | ICD-10-CM | POA: Diagnosis not present

## 2018-05-27 DIAGNOSIS — N186 End stage renal disease: Secondary | ICD-10-CM | POA: Diagnosis not present

## 2018-05-27 DIAGNOSIS — T8620 Unspecified complication of heart transplant: Secondary | ICD-10-CM | POA: Diagnosis not present

## 2018-05-28 DIAGNOSIS — D631 Anemia in chronic kidney disease: Secondary | ICD-10-CM | POA: Diagnosis not present

## 2018-05-28 DIAGNOSIS — N186 End stage renal disease: Secondary | ICD-10-CM | POA: Diagnosis not present

## 2018-05-28 DIAGNOSIS — Z992 Dependence on renal dialysis: Secondary | ICD-10-CM | POA: Diagnosis not present

## 2018-05-28 DIAGNOSIS — E1129 Type 2 diabetes mellitus with other diabetic kidney complication: Secondary | ICD-10-CM | POA: Diagnosis not present

## 2018-05-28 DIAGNOSIS — N2581 Secondary hyperparathyroidism of renal origin: Secondary | ICD-10-CM | POA: Diagnosis not present

## 2018-05-29 DIAGNOSIS — Z79899 Other long term (current) drug therapy: Secondary | ICD-10-CM | POA: Diagnosis not present

## 2018-05-29 DIAGNOSIS — G629 Polyneuropathy, unspecified: Secondary | ICD-10-CM | POA: Diagnosis not present

## 2018-05-29 DIAGNOSIS — N186 End stage renal disease: Secondary | ICD-10-CM | POA: Diagnosis not present

## 2018-05-29 DIAGNOSIS — Z4821 Encounter for aftercare following heart transplant: Secondary | ICD-10-CM | POA: Diagnosis not present

## 2018-05-29 DIAGNOSIS — I10 Essential (primary) hypertension: Secondary | ICD-10-CM | POA: Diagnosis not present

## 2018-05-29 DIAGNOSIS — Z48298 Encounter for aftercare following other organ transplant: Secondary | ICD-10-CM | POA: Diagnosis not present

## 2018-05-29 DIAGNOSIS — Z23 Encounter for immunization: Secondary | ICD-10-CM | POA: Diagnosis not present

## 2018-05-29 DIAGNOSIS — Z941 Heart transplant status: Secondary | ICD-10-CM | POA: Diagnosis not present

## 2018-05-29 DIAGNOSIS — J9811 Atelectasis: Secondary | ICD-10-CM | POA: Diagnosis not present

## 2018-05-29 DIAGNOSIS — Z125 Encounter for screening for malignant neoplasm of prostate: Secondary | ICD-10-CM | POA: Diagnosis not present

## 2018-05-29 DIAGNOSIS — Z992 Dependence on renal dialysis: Secondary | ICD-10-CM | POA: Diagnosis not present

## 2018-05-29 DIAGNOSIS — Z96641 Presence of right artificial hip joint: Secondary | ICD-10-CM | POA: Diagnosis not present

## 2018-05-29 DIAGNOSIS — C61 Malignant neoplasm of prostate: Secondary | ICD-10-CM | POA: Diagnosis not present

## 2018-05-30 DIAGNOSIS — N2581 Secondary hyperparathyroidism of renal origin: Secondary | ICD-10-CM | POA: Diagnosis not present

## 2018-05-30 DIAGNOSIS — D631 Anemia in chronic kidney disease: Secondary | ICD-10-CM | POA: Diagnosis not present

## 2018-05-30 DIAGNOSIS — Z992 Dependence on renal dialysis: Secondary | ICD-10-CM | POA: Diagnosis not present

## 2018-05-30 DIAGNOSIS — N186 End stage renal disease: Secondary | ICD-10-CM | POA: Diagnosis not present

## 2018-05-30 DIAGNOSIS — E1129 Type 2 diabetes mellitus with other diabetic kidney complication: Secondary | ICD-10-CM | POA: Diagnosis not present

## 2018-06-02 DIAGNOSIS — E1129 Type 2 diabetes mellitus with other diabetic kidney complication: Secondary | ICD-10-CM | POA: Diagnosis not present

## 2018-06-02 DIAGNOSIS — N186 End stage renal disease: Secondary | ICD-10-CM | POA: Diagnosis not present

## 2018-06-02 DIAGNOSIS — D631 Anemia in chronic kidney disease: Secondary | ICD-10-CM | POA: Diagnosis not present

## 2018-06-02 DIAGNOSIS — Z992 Dependence on renal dialysis: Secondary | ICD-10-CM | POA: Diagnosis not present

## 2018-06-02 DIAGNOSIS — N2581 Secondary hyperparathyroidism of renal origin: Secondary | ICD-10-CM | POA: Diagnosis not present

## 2018-06-04 DIAGNOSIS — N2581 Secondary hyperparathyroidism of renal origin: Secondary | ICD-10-CM | POA: Diagnosis not present

## 2018-06-04 DIAGNOSIS — E1129 Type 2 diabetes mellitus with other diabetic kidney complication: Secondary | ICD-10-CM | POA: Diagnosis not present

## 2018-06-04 DIAGNOSIS — N186 End stage renal disease: Secondary | ICD-10-CM | POA: Diagnosis not present

## 2018-06-04 DIAGNOSIS — D631 Anemia in chronic kidney disease: Secondary | ICD-10-CM | POA: Diagnosis not present

## 2018-06-04 DIAGNOSIS — Z992 Dependence on renal dialysis: Secondary | ICD-10-CM | POA: Diagnosis not present

## 2018-06-06 DIAGNOSIS — E1129 Type 2 diabetes mellitus with other diabetic kidney complication: Secondary | ICD-10-CM | POA: Diagnosis not present

## 2018-06-06 DIAGNOSIS — D631 Anemia in chronic kidney disease: Secondary | ICD-10-CM | POA: Diagnosis not present

## 2018-06-06 DIAGNOSIS — Z992 Dependence on renal dialysis: Secondary | ICD-10-CM | POA: Diagnosis not present

## 2018-06-06 DIAGNOSIS — N2581 Secondary hyperparathyroidism of renal origin: Secondary | ICD-10-CM | POA: Diagnosis not present

## 2018-06-06 DIAGNOSIS — N186 End stage renal disease: Secondary | ICD-10-CM | POA: Diagnosis not present

## 2018-06-09 DIAGNOSIS — N186 End stage renal disease: Secondary | ICD-10-CM | POA: Diagnosis not present

## 2018-06-09 DIAGNOSIS — N2581 Secondary hyperparathyroidism of renal origin: Secondary | ICD-10-CM | POA: Diagnosis not present

## 2018-06-09 DIAGNOSIS — Z992 Dependence on renal dialysis: Secondary | ICD-10-CM | POA: Diagnosis not present

## 2018-06-09 DIAGNOSIS — E1129 Type 2 diabetes mellitus with other diabetic kidney complication: Secondary | ICD-10-CM | POA: Diagnosis not present

## 2018-06-09 DIAGNOSIS — D631 Anemia in chronic kidney disease: Secondary | ICD-10-CM | POA: Diagnosis not present

## 2018-06-11 DIAGNOSIS — Z992 Dependence on renal dialysis: Secondary | ICD-10-CM | POA: Diagnosis not present

## 2018-06-11 DIAGNOSIS — E1129 Type 2 diabetes mellitus with other diabetic kidney complication: Secondary | ICD-10-CM | POA: Diagnosis not present

## 2018-06-11 DIAGNOSIS — N2581 Secondary hyperparathyroidism of renal origin: Secondary | ICD-10-CM | POA: Diagnosis not present

## 2018-06-11 DIAGNOSIS — D631 Anemia in chronic kidney disease: Secondary | ICD-10-CM | POA: Diagnosis not present

## 2018-06-11 DIAGNOSIS — N186 End stage renal disease: Secondary | ICD-10-CM | POA: Diagnosis not present

## 2018-06-13 DIAGNOSIS — D631 Anemia in chronic kidney disease: Secondary | ICD-10-CM | POA: Diagnosis not present

## 2018-06-13 DIAGNOSIS — E1129 Type 2 diabetes mellitus with other diabetic kidney complication: Secondary | ICD-10-CM | POA: Diagnosis not present

## 2018-06-13 DIAGNOSIS — Z992 Dependence on renal dialysis: Secondary | ICD-10-CM | POA: Diagnosis not present

## 2018-06-13 DIAGNOSIS — N186 End stage renal disease: Secondary | ICD-10-CM | POA: Diagnosis not present

## 2018-06-13 DIAGNOSIS — N2581 Secondary hyperparathyroidism of renal origin: Secondary | ICD-10-CM | POA: Diagnosis not present

## 2018-06-16 DIAGNOSIS — Z992 Dependence on renal dialysis: Secondary | ICD-10-CM | POA: Diagnosis not present

## 2018-06-16 DIAGNOSIS — E1129 Type 2 diabetes mellitus with other diabetic kidney complication: Secondary | ICD-10-CM | POA: Diagnosis not present

## 2018-06-16 DIAGNOSIS — D631 Anemia in chronic kidney disease: Secondary | ICD-10-CM | POA: Diagnosis not present

## 2018-06-16 DIAGNOSIS — N186 End stage renal disease: Secondary | ICD-10-CM | POA: Diagnosis not present

## 2018-06-16 DIAGNOSIS — N2581 Secondary hyperparathyroidism of renal origin: Secondary | ICD-10-CM | POA: Diagnosis not present

## 2018-06-17 DIAGNOSIS — R972 Elevated prostate specific antigen [PSA]: Secondary | ICD-10-CM | POA: Diagnosis not present

## 2018-06-17 DIAGNOSIS — C61 Malignant neoplasm of prostate: Secondary | ICD-10-CM | POA: Diagnosis not present

## 2018-06-18 DIAGNOSIS — N2581 Secondary hyperparathyroidism of renal origin: Secondary | ICD-10-CM | POA: Diagnosis not present

## 2018-06-18 DIAGNOSIS — Z992 Dependence on renal dialysis: Secondary | ICD-10-CM | POA: Diagnosis not present

## 2018-06-18 DIAGNOSIS — D631 Anemia in chronic kidney disease: Secondary | ICD-10-CM | POA: Diagnosis not present

## 2018-06-18 DIAGNOSIS — E1129 Type 2 diabetes mellitus with other diabetic kidney complication: Secondary | ICD-10-CM | POA: Diagnosis not present

## 2018-06-18 DIAGNOSIS — N186 End stage renal disease: Secondary | ICD-10-CM | POA: Diagnosis not present

## 2018-06-20 DIAGNOSIS — N2581 Secondary hyperparathyroidism of renal origin: Secondary | ICD-10-CM | POA: Diagnosis not present

## 2018-06-20 DIAGNOSIS — D631 Anemia in chronic kidney disease: Secondary | ICD-10-CM | POA: Diagnosis not present

## 2018-06-20 DIAGNOSIS — Z992 Dependence on renal dialysis: Secondary | ICD-10-CM | POA: Diagnosis not present

## 2018-06-20 DIAGNOSIS — N186 End stage renal disease: Secondary | ICD-10-CM | POA: Diagnosis not present

## 2018-06-20 DIAGNOSIS — E1129 Type 2 diabetes mellitus with other diabetic kidney complication: Secondary | ICD-10-CM | POA: Diagnosis not present

## 2018-06-23 DIAGNOSIS — E1129 Type 2 diabetes mellitus with other diabetic kidney complication: Secondary | ICD-10-CM | POA: Diagnosis not present

## 2018-06-23 DIAGNOSIS — D631 Anemia in chronic kidney disease: Secondary | ICD-10-CM | POA: Diagnosis not present

## 2018-06-23 DIAGNOSIS — Z992 Dependence on renal dialysis: Secondary | ICD-10-CM | POA: Diagnosis not present

## 2018-06-23 DIAGNOSIS — N186 End stage renal disease: Secondary | ICD-10-CM | POA: Diagnosis not present

## 2018-06-23 DIAGNOSIS — N2581 Secondary hyperparathyroidism of renal origin: Secondary | ICD-10-CM | POA: Diagnosis not present

## 2018-06-25 DIAGNOSIS — E1129 Type 2 diabetes mellitus with other diabetic kidney complication: Secondary | ICD-10-CM | POA: Diagnosis not present

## 2018-06-25 DIAGNOSIS — N186 End stage renal disease: Secondary | ICD-10-CM | POA: Diagnosis not present

## 2018-06-25 DIAGNOSIS — Z992 Dependence on renal dialysis: Secondary | ICD-10-CM | POA: Diagnosis not present

## 2018-06-25 DIAGNOSIS — N2581 Secondary hyperparathyroidism of renal origin: Secondary | ICD-10-CM | POA: Diagnosis not present

## 2018-06-25 DIAGNOSIS — D631 Anemia in chronic kidney disease: Secondary | ICD-10-CM | POA: Diagnosis not present

## 2018-06-27 DIAGNOSIS — N186 End stage renal disease: Secondary | ICD-10-CM | POA: Diagnosis not present

## 2018-06-27 DIAGNOSIS — E1129 Type 2 diabetes mellitus with other diabetic kidney complication: Secondary | ICD-10-CM | POA: Diagnosis not present

## 2018-06-27 DIAGNOSIS — T8620 Unspecified complication of heart transplant: Secondary | ICD-10-CM | POA: Diagnosis not present

## 2018-06-27 DIAGNOSIS — E8770 Fluid overload, unspecified: Secondary | ICD-10-CM | POA: Diagnosis not present

## 2018-06-27 DIAGNOSIS — D631 Anemia in chronic kidney disease: Secondary | ICD-10-CM | POA: Diagnosis not present

## 2018-06-27 DIAGNOSIS — Z992 Dependence on renal dialysis: Secondary | ICD-10-CM | POA: Diagnosis not present

## 2018-06-27 DIAGNOSIS — N2581 Secondary hyperparathyroidism of renal origin: Secondary | ICD-10-CM | POA: Diagnosis not present

## 2018-06-28 DIAGNOSIS — E8770 Fluid overload, unspecified: Secondary | ICD-10-CM | POA: Diagnosis not present

## 2018-06-28 DIAGNOSIS — D631 Anemia in chronic kidney disease: Secondary | ICD-10-CM | POA: Diagnosis not present

## 2018-06-28 DIAGNOSIS — N2581 Secondary hyperparathyroidism of renal origin: Secondary | ICD-10-CM | POA: Diagnosis not present

## 2018-06-28 DIAGNOSIS — Z992 Dependence on renal dialysis: Secondary | ICD-10-CM | POA: Diagnosis not present

## 2018-06-28 DIAGNOSIS — E1129 Type 2 diabetes mellitus with other diabetic kidney complication: Secondary | ICD-10-CM | POA: Diagnosis not present

## 2018-06-28 DIAGNOSIS — N186 End stage renal disease: Secondary | ICD-10-CM | POA: Diagnosis not present

## 2018-06-30 DIAGNOSIS — C61 Malignant neoplasm of prostate: Secondary | ICD-10-CM | POA: Diagnosis not present

## 2018-07-01 ENCOUNTER — Other Ambulatory Visit: Payer: Self-pay | Admitting: Urology

## 2018-07-01 DIAGNOSIS — D631 Anemia in chronic kidney disease: Secondary | ICD-10-CM | POA: Diagnosis not present

## 2018-07-01 DIAGNOSIS — C61 Malignant neoplasm of prostate: Secondary | ICD-10-CM

## 2018-07-01 DIAGNOSIS — N2581 Secondary hyperparathyroidism of renal origin: Secondary | ICD-10-CM | POA: Diagnosis not present

## 2018-07-01 DIAGNOSIS — Z992 Dependence on renal dialysis: Secondary | ICD-10-CM | POA: Diagnosis not present

## 2018-07-01 DIAGNOSIS — N186 End stage renal disease: Secondary | ICD-10-CM | POA: Diagnosis not present

## 2018-07-01 DIAGNOSIS — E1129 Type 2 diabetes mellitus with other diabetic kidney complication: Secondary | ICD-10-CM | POA: Diagnosis not present

## 2018-07-01 DIAGNOSIS — E8770 Fluid overload, unspecified: Secondary | ICD-10-CM | POA: Diagnosis not present

## 2018-07-02 ENCOUNTER — Telehealth: Payer: Self-pay | Admitting: *Deleted

## 2018-07-02 DIAGNOSIS — E8770 Fluid overload, unspecified: Secondary | ICD-10-CM | POA: Diagnosis not present

## 2018-07-02 DIAGNOSIS — Z992 Dependence on renal dialysis: Secondary | ICD-10-CM | POA: Diagnosis not present

## 2018-07-02 DIAGNOSIS — N186 End stage renal disease: Secondary | ICD-10-CM | POA: Diagnosis not present

## 2018-07-02 DIAGNOSIS — E1129 Type 2 diabetes mellitus with other diabetic kidney complication: Secondary | ICD-10-CM | POA: Diagnosis not present

## 2018-07-02 DIAGNOSIS — D631 Anemia in chronic kidney disease: Secondary | ICD-10-CM | POA: Diagnosis not present

## 2018-07-02 DIAGNOSIS — N2581 Secondary hyperparathyroidism of renal origin: Secondary | ICD-10-CM | POA: Diagnosis not present

## 2018-07-02 NOTE — Telephone Encounter (Signed)
CALLED PATIENT TO INFORM OF SIM FOR 07-03-18 @ 1 PM AND HIS MRI ON 07-08-18 - ARRIVAL TIME - 3:30 PM @ [facility], NO RESTRICTIONS TO TEST, SPOKE WITH PATIENT AND HE IS AWARE OF THESE APPTS.

## 2018-07-03 ENCOUNTER — Ambulatory Visit
Admission: RE | Admit: 2018-07-03 | Discharge: 2018-07-03 | Disposition: A | Discharge status: Home / Self Care | Payer: Medicare Other | Source: Ambulatory Visit | Attending: Radiation Oncology | Admitting: Radiation Oncology

## 2018-07-03 DIAGNOSIS — C61 Malignant neoplasm of prostate: Secondary | ICD-10-CM | POA: Insufficient documentation

## 2018-07-03 DIAGNOSIS — Z510 Encounter for antineoplastic radiation therapy: Secondary | ICD-10-CM | POA: Diagnosis not present

## 2018-07-03 NOTE — Progress Notes (Signed)
  Radiation Oncology         [phone]) [phone] ________________________________  Name: [name] MRN: [id number]  Date: 07/03/2018  DOB: Aug 27, 1961  SIMULATION AND TREATMENT PLANNING NOTE    ICD-10-CM   1. Malignant neoplasm of prostate ([place]) C61     DIAGNOSIS:  58 y.o. gentleman with stage T2a adenocarcinoma of the prostate with a Gleason's score of 3+4 and a PSA of 9.26  NARRATIVE:  The patient was brought to the [facility].  Identity was confirmed.  All relevant records and images related to the planned course of therapy were reviewed.  The patient freely provided informed written consent to proceed with treatment after reviewing the details related to the planned course of therapy. The consent form was witnessed and verified by the simulation staff.  Then, the patient was set-up in a stable reproducible supine position for radiation therapy.  A vacuum lock pillow device was custom fabricated to position his legs in a reproducible immobilized position.  Then, I performed a urethrogram under sterile conditions to identify the prostatic apex.  CT images were obtained.  Surface markings were placed.  The CT images were loaded into the planning software.  Then the prostate target and avoidance structures including the rectum, bladder, bowel and hips were contoured.  Treatment planning then occurred.  The radiation prescription was entered and confirmed.  A total of one complex treatment devices was fabricated. I have requested : Intensity Modulated Radiotherapy (IMRT) is medically necessary for this case for the following reason:  Rectal sparing.Marland Kitchen  PLAN:  The patient will receive 70 Gy in 28 fractions.  ________________________________  Sheral Apley Tammi Klippel, M.D.

## 2018-07-04 DIAGNOSIS — N2581 Secondary hyperparathyroidism of renal origin: Secondary | ICD-10-CM | POA: Diagnosis not present

## 2018-07-04 DIAGNOSIS — Z992 Dependence on renal dialysis: Secondary | ICD-10-CM | POA: Diagnosis not present

## 2018-07-04 DIAGNOSIS — E8770 Fluid overload, unspecified: Secondary | ICD-10-CM | POA: Diagnosis not present

## 2018-07-04 DIAGNOSIS — E1129 Type 2 diabetes mellitus with other diabetic kidney complication: Secondary | ICD-10-CM | POA: Diagnosis not present

## 2018-07-04 DIAGNOSIS — D631 Anemia in chronic kidney disease: Secondary | ICD-10-CM | POA: Diagnosis not present

## 2018-07-04 DIAGNOSIS — N186 End stage renal disease: Secondary | ICD-10-CM | POA: Diagnosis not present

## 2018-07-06 NOTE — Progress Notes (Deleted)
  Radiation Oncology         [phone]) [phone] ________________________________  Name: [name] MRN: [id number]  Date: 07/03/2018  DOB: 31-May-1961  SIMULATION AND TREATMENT PLANNING NOTE    ICD-10-CM   1. Malignant neoplasm of prostate ([place]) C61     DIAGNOSIS:  57 y.o. gentleman with stage T2a adenocarcinoma of the prostate with a Gleason's score of 3+4 and a PSA of 9.26  NARRATIVE:  The patient was brought to the [facility].  Identity was confirmed.  All relevant records and images related to the planned course of therapy were reviewed.  The patient freely provided informed written consent to proceed with treatment after reviewing the details related to the planned course of therapy. The consent form was witnessed and verified by the simulation staff.  Then, the patient was set-up in a stable reproducible supine position for radiation therapy.  A vacuum lock pillow device was custom fabricated to position his legs in a reproducible immobilized position.  Then, I performed a urethrogram under sterile conditions to identify the prostatic apex.  CT images were obtained.  Surface markings were placed.  The CT images were loaded into the planning software.  Then the prostate target and avoidance structures including the rectum, bladder, bowel and hips were contoured.  Treatment planning then occurred.  The radiation prescription was entered and confirmed.  A total of one complex treatment devices was fabricated. I have requested : Intensity Modulated Radiotherapy (IMRT) is medically necessary for this case for the following reason:  Rectal sparing.Marland Kitchen  PLAN:  The patient will receive 70 Gy in 28 fractions.  ________________________________  Sheral Apley Tammi Klippel, M.D.

## 2018-07-07 DIAGNOSIS — D631 Anemia in chronic kidney disease: Secondary | ICD-10-CM | POA: Diagnosis not present

## 2018-07-07 DIAGNOSIS — N186 End stage renal disease: Secondary | ICD-10-CM | POA: Diagnosis not present

## 2018-07-07 DIAGNOSIS — N2581 Secondary hyperparathyroidism of renal origin: Secondary | ICD-10-CM | POA: Diagnosis not present

## 2018-07-07 DIAGNOSIS — E8770 Fluid overload, unspecified: Secondary | ICD-10-CM | POA: Diagnosis not present

## 2018-07-07 DIAGNOSIS — Z992 Dependence on renal dialysis: Secondary | ICD-10-CM | POA: Diagnosis not present

## 2018-07-07 DIAGNOSIS — E1129 Type 2 diabetes mellitus with other diabetic kidney complication: Secondary | ICD-10-CM | POA: Diagnosis not present

## 2018-07-08 ENCOUNTER — Ambulatory Visit (HOSPITAL_COMMUNITY): Admission: RE | Admit: 2018-07-08 | Payer: Medicare Other | Source: Ambulatory Visit

## 2018-07-08 ENCOUNTER — Encounter: Payer: Self-pay | Admitting: Urology

## 2018-07-08 NOTE — Progress Notes (Signed)
Patient started ADT with Dr. Louis Meckel 05/13/18 (Lupron 45mg - 6 month) and had fiducials and SpaceOAR gel placed 07/01/18 in office at [facility].  CT [facility] 07/03/18, MRI prostate 07/08/18 for SpaceOAR verification/treatment planning and scheduled to begin his 5.5 wk course of daily prostate IMRT on 07/14/18.  Nicholos Johns, MMS, PA-C [facility]: [phone]  Fax: [phone]

## 2018-07-09 ENCOUNTER — Telehealth: Payer: Self-pay | Admitting: *Deleted

## 2018-07-09 DIAGNOSIS — E1129 Type 2 diabetes mellitus with other diabetic kidney complication: Secondary | ICD-10-CM | POA: Diagnosis not present

## 2018-07-09 DIAGNOSIS — D631 Anemia in chronic kidney disease: Secondary | ICD-10-CM | POA: Diagnosis not present

## 2018-07-09 DIAGNOSIS — N2581 Secondary hyperparathyroidism of renal origin: Secondary | ICD-10-CM | POA: Diagnosis not present

## 2018-07-09 DIAGNOSIS — N186 End stage renal disease: Secondary | ICD-10-CM | POA: Diagnosis not present

## 2018-07-09 DIAGNOSIS — Z992 Dependence on renal dialysis: Secondary | ICD-10-CM | POA: Diagnosis not present

## 2018-07-09 DIAGNOSIS — E8770 Fluid overload, unspecified: Secondary | ICD-10-CM | POA: Diagnosis not present

## 2018-07-09 NOTE — Telephone Encounter (Signed)
Called patient to ask about missing scan and to get his in-put rescheduling it, lvm for a return call

## 2018-07-11 DIAGNOSIS — E8770 Fluid overload, unspecified: Secondary | ICD-10-CM | POA: Diagnosis not present

## 2018-07-11 DIAGNOSIS — N2581 Secondary hyperparathyroidism of renal origin: Secondary | ICD-10-CM | POA: Diagnosis not present

## 2018-07-11 DIAGNOSIS — E1129 Type 2 diabetes mellitus with other diabetic kidney complication: Secondary | ICD-10-CM | POA: Diagnosis not present

## 2018-07-11 DIAGNOSIS — D631 Anemia in chronic kidney disease: Secondary | ICD-10-CM | POA: Diagnosis not present

## 2018-07-11 DIAGNOSIS — N186 End stage renal disease: Secondary | ICD-10-CM | POA: Diagnosis not present

## 2018-07-11 DIAGNOSIS — Z992 Dependence on renal dialysis: Secondary | ICD-10-CM | POA: Diagnosis not present

## 2018-07-12 ENCOUNTER — Ambulatory Visit (HOSPITAL_COMMUNITY)
Admission: RE | Admit: 2018-07-12 | Discharge: 2018-07-12 | Disposition: A | Discharge status: Home / Self Care | Payer: Medicare Other | Source: Ambulatory Visit | Attending: Urology | Admitting: Urology

## 2018-07-12 DIAGNOSIS — C61 Malignant neoplasm of prostate: Secondary | ICD-10-CM | POA: Insufficient documentation

## 2018-07-14 ENCOUNTER — Ambulatory Visit: Payer: Medicare Other

## 2018-07-14 DIAGNOSIS — E8770 Fluid overload, unspecified: Secondary | ICD-10-CM | POA: Diagnosis not present

## 2018-07-14 DIAGNOSIS — E1129 Type 2 diabetes mellitus with other diabetic kidney complication: Secondary | ICD-10-CM | POA: Diagnosis not present

## 2018-07-14 DIAGNOSIS — Z992 Dependence on renal dialysis: Secondary | ICD-10-CM | POA: Diagnosis not present

## 2018-07-14 DIAGNOSIS — D631 Anemia in chronic kidney disease: Secondary | ICD-10-CM | POA: Diagnosis not present

## 2018-07-14 DIAGNOSIS — N186 End stage renal disease: Secondary | ICD-10-CM | POA: Diagnosis not present

## 2018-07-14 DIAGNOSIS — N2581 Secondary hyperparathyroidism of renal origin: Secondary | ICD-10-CM | POA: Diagnosis not present

## 2018-07-15 ENCOUNTER — Ambulatory Visit: Payer: Medicare Other

## 2018-07-16 ENCOUNTER — Ambulatory Visit: Payer: Medicare Other

## 2018-07-16 DIAGNOSIS — Z992 Dependence on renal dialysis: Secondary | ICD-10-CM | POA: Diagnosis not present

## 2018-07-16 DIAGNOSIS — N186 End stage renal disease: Secondary | ICD-10-CM | POA: Diagnosis not present

## 2018-07-16 DIAGNOSIS — N2581 Secondary hyperparathyroidism of renal origin: Secondary | ICD-10-CM | POA: Diagnosis not present

## 2018-07-16 DIAGNOSIS — E1129 Type 2 diabetes mellitus with other diabetic kidney complication: Secondary | ICD-10-CM | POA: Diagnosis not present

## 2018-07-16 DIAGNOSIS — E8770 Fluid overload, unspecified: Secondary | ICD-10-CM | POA: Diagnosis not present

## 2018-07-16 DIAGNOSIS — D631 Anemia in chronic kidney disease: Secondary | ICD-10-CM | POA: Diagnosis not present

## 2018-07-17 ENCOUNTER — Ambulatory Visit
Admission: RE | Admit: 2018-07-17 | Discharge: 2018-07-17 | Disposition: A | Discharge status: Home / Self Care | Payer: Medicare Other | Source: Ambulatory Visit | Attending: Radiation Oncology | Admitting: Radiation Oncology

## 2018-07-17 ENCOUNTER — Ambulatory Visit: Payer: Medicare Other

## 2018-07-17 ENCOUNTER — Encounter: Payer: Self-pay | Admitting: Medical Oncology

## 2018-07-17 DIAGNOSIS — C61 Malignant neoplasm of prostate: Secondary | ICD-10-CM | POA: Diagnosis not present

## 2018-07-17 DIAGNOSIS — Z510 Encounter for antineoplastic radiation therapy: Secondary | ICD-10-CM | POA: Diagnosis not present

## 2018-07-18 ENCOUNTER — Ambulatory Visit: Payer: Medicare Other

## 2018-07-18 ENCOUNTER — Ambulatory Visit
Admission: RE | Admit: 2018-07-18 | Discharge: 2018-07-18 | Disposition: A | Discharge status: Home / Self Care | Payer: Medicare Other | Source: Ambulatory Visit | Attending: Radiation Oncology | Admitting: Radiation Oncology

## 2018-07-18 DIAGNOSIS — E1129 Type 2 diabetes mellitus with other diabetic kidney complication: Secondary | ICD-10-CM | POA: Diagnosis not present

## 2018-07-18 DIAGNOSIS — Z510 Encounter for antineoplastic radiation therapy: Secondary | ICD-10-CM | POA: Diagnosis not present

## 2018-07-18 DIAGNOSIS — Z992 Dependence on renal dialysis: Secondary | ICD-10-CM | POA: Diagnosis not present

## 2018-07-18 DIAGNOSIS — D631 Anemia in chronic kidney disease: Secondary | ICD-10-CM | POA: Diagnosis not present

## 2018-07-18 DIAGNOSIS — N186 End stage renal disease: Secondary | ICD-10-CM | POA: Diagnosis not present

## 2018-07-18 DIAGNOSIS — C61 Malignant neoplasm of prostate: Secondary | ICD-10-CM | POA: Diagnosis not present

## 2018-07-18 DIAGNOSIS — N2581 Secondary hyperparathyroidism of renal origin: Secondary | ICD-10-CM | POA: Diagnosis not present

## 2018-07-18 DIAGNOSIS — E8770 Fluid overload, unspecified: Secondary | ICD-10-CM | POA: Diagnosis not present

## 2018-07-20 DIAGNOSIS — E8770 Fluid overload, unspecified: Secondary | ICD-10-CM | POA: Diagnosis not present

## 2018-07-20 DIAGNOSIS — N2581 Secondary hyperparathyroidism of renal origin: Secondary | ICD-10-CM | POA: Diagnosis not present

## 2018-07-20 DIAGNOSIS — D631 Anemia in chronic kidney disease: Secondary | ICD-10-CM | POA: Diagnosis not present

## 2018-07-20 DIAGNOSIS — Z992 Dependence on renal dialysis: Secondary | ICD-10-CM | POA: Diagnosis not present

## 2018-07-20 DIAGNOSIS — N186 End stage renal disease: Secondary | ICD-10-CM | POA: Diagnosis not present

## 2018-07-20 DIAGNOSIS — E1129 Type 2 diabetes mellitus with other diabetic kidney complication: Secondary | ICD-10-CM | POA: Diagnosis not present

## 2018-07-21 ENCOUNTER — Ambulatory Visit: Payer: Medicare Other

## 2018-07-21 ENCOUNTER — Ambulatory Visit
Admission: RE | Admit: 2018-07-21 | Discharge: 2018-07-21 | Disposition: A | Discharge status: Home / Self Care | Payer: Medicare Other | Source: Ambulatory Visit | Attending: Radiation Oncology | Admitting: Radiation Oncology

## 2018-07-21 DIAGNOSIS — Z510 Encounter for antineoplastic radiation therapy: Secondary | ICD-10-CM | POA: Diagnosis not present

## 2018-07-21 DIAGNOSIS — C61 Malignant neoplasm of prostate: Secondary | ICD-10-CM | POA: Diagnosis not present

## 2018-07-22 ENCOUNTER — Ambulatory Visit: Payer: Medicare Other

## 2018-07-22 ENCOUNTER — Ambulatory Visit
Admission: RE | Admit: 2018-07-22 | Discharge: 2018-07-22 | Disposition: A | Discharge status: Home / Self Care | Payer: Medicare Other | Source: Ambulatory Visit | Attending: Radiation Oncology | Admitting: Radiation Oncology

## 2018-07-22 DIAGNOSIS — E8770 Fluid overload, unspecified: Secondary | ICD-10-CM | POA: Diagnosis not present

## 2018-07-22 DIAGNOSIS — Z992 Dependence on renal dialysis: Secondary | ICD-10-CM | POA: Diagnosis not present

## 2018-07-22 DIAGNOSIS — Z510 Encounter for antineoplastic radiation therapy: Secondary | ICD-10-CM | POA: Diagnosis not present

## 2018-07-22 DIAGNOSIS — N186 End stage renal disease: Secondary | ICD-10-CM | POA: Diagnosis not present

## 2018-07-22 DIAGNOSIS — N2581 Secondary hyperparathyroidism of renal origin: Secondary | ICD-10-CM | POA: Diagnosis not present

## 2018-07-22 DIAGNOSIS — E1129 Type 2 diabetes mellitus with other diabetic kidney complication: Secondary | ICD-10-CM | POA: Diagnosis not present

## 2018-07-22 DIAGNOSIS — D631 Anemia in chronic kidney disease: Secondary | ICD-10-CM | POA: Diagnosis not present

## 2018-07-22 DIAGNOSIS — C61 Malignant neoplasm of prostate: Secondary | ICD-10-CM | POA: Diagnosis not present

## 2018-07-23 ENCOUNTER — Ambulatory Visit: Payer: Medicare Other

## 2018-07-23 ENCOUNTER — Ambulatory Visit: Admission: RE | Admit: 2018-07-23 | Payer: Medicare Other | Source: Ambulatory Visit

## 2018-07-25 DIAGNOSIS — N2581 Secondary hyperparathyroidism of renal origin: Secondary | ICD-10-CM | POA: Diagnosis not present

## 2018-07-25 DIAGNOSIS — N186 End stage renal disease: Secondary | ICD-10-CM | POA: Diagnosis not present

## 2018-07-27 DIAGNOSIS — T8620 Unspecified complication of heart transplant: Secondary | ICD-10-CM | POA: Diagnosis not present

## 2018-07-27 DIAGNOSIS — N186 End stage renal disease: Secondary | ICD-10-CM | POA: Diagnosis not present

## 2018-07-27 DIAGNOSIS — Z992 Dependence on renal dialysis: Secondary | ICD-10-CM | POA: Diagnosis not present

## 2018-07-28 ENCOUNTER — Ambulatory Visit: Payer: Medicare Other

## 2018-07-28 ENCOUNTER — Ambulatory Visit
Admission: RE | Admit: 2018-07-28 | Discharge: 2018-07-28 | Disposition: A | Discharge status: Home / Self Care | Payer: Medicare Other | Source: Ambulatory Visit | Attending: Radiation Oncology | Admitting: Radiation Oncology

## 2018-07-28 DIAGNOSIS — Z510 Encounter for antineoplastic radiation therapy: Secondary | ICD-10-CM | POA: Insufficient documentation

## 2018-07-28 DIAGNOSIS — D631 Anemia in chronic kidney disease: Secondary | ICD-10-CM | POA: Diagnosis not present

## 2018-07-28 DIAGNOSIS — C61 Malignant neoplasm of prostate: Secondary | ICD-10-CM | POA: Diagnosis not present

## 2018-07-28 DIAGNOSIS — N186 End stage renal disease: Secondary | ICD-10-CM | POA: Diagnosis not present

## 2018-07-28 DIAGNOSIS — E1129 Type 2 diabetes mellitus with other diabetic kidney complication: Secondary | ICD-10-CM | POA: Diagnosis not present

## 2018-07-28 DIAGNOSIS — D509 Iron deficiency anemia, unspecified: Secondary | ICD-10-CM | POA: Diagnosis not present

## 2018-07-28 DIAGNOSIS — Z992 Dependence on renal dialysis: Secondary | ICD-10-CM | POA: Diagnosis not present

## 2018-07-28 DIAGNOSIS — N2581 Secondary hyperparathyroidism of renal origin: Secondary | ICD-10-CM | POA: Diagnosis not present

## 2018-07-29 ENCOUNTER — Ambulatory Visit
Admission: RE | Admit: 2018-07-29 | Discharge: 2018-07-29 | Disposition: A | Discharge status: Home / Self Care | Payer: Medicare Other | Source: Ambulatory Visit | Attending: Radiation Oncology | Admitting: Radiation Oncology

## 2018-07-29 ENCOUNTER — Ambulatory Visit: Payer: Medicare Other

## 2018-07-29 DIAGNOSIS — C61 Malignant neoplasm of prostate: Secondary | ICD-10-CM | POA: Diagnosis not present

## 2018-07-29 DIAGNOSIS — Z510 Encounter for antineoplastic radiation therapy: Secondary | ICD-10-CM | POA: Diagnosis not present

## 2018-07-30 ENCOUNTER — Ambulatory Visit
Admission: RE | Admit: 2018-07-30 | Discharge: 2018-07-30 | Disposition: A | Discharge status: Home / Self Care | Payer: Medicare Other | Source: Ambulatory Visit | Attending: Radiation Oncology | Admitting: Radiation Oncology

## 2018-07-30 ENCOUNTER — Ambulatory Visit: Payer: Medicare Other

## 2018-07-30 DIAGNOSIS — Z992 Dependence on renal dialysis: Secondary | ICD-10-CM | POA: Diagnosis not present

## 2018-07-30 DIAGNOSIS — Z510 Encounter for antineoplastic radiation therapy: Secondary | ICD-10-CM | POA: Diagnosis not present

## 2018-07-30 DIAGNOSIS — E1129 Type 2 diabetes mellitus with other diabetic kidney complication: Secondary | ICD-10-CM | POA: Diagnosis not present

## 2018-07-30 DIAGNOSIS — D509 Iron deficiency anemia, unspecified: Secondary | ICD-10-CM | POA: Diagnosis not present

## 2018-07-30 DIAGNOSIS — N2581 Secondary hyperparathyroidism of renal origin: Secondary | ICD-10-CM | POA: Diagnosis not present

## 2018-07-30 DIAGNOSIS — N186 End stage renal disease: Secondary | ICD-10-CM | POA: Diagnosis not present

## 2018-07-30 DIAGNOSIS — D631 Anemia in chronic kidney disease: Secondary | ICD-10-CM | POA: Diagnosis not present

## 2018-07-30 DIAGNOSIS — C61 Malignant neoplasm of prostate: Secondary | ICD-10-CM | POA: Diagnosis not present

## 2018-07-31 ENCOUNTER — Ambulatory Visit: Payer: Medicare Other

## 2018-07-31 ENCOUNTER — Ambulatory Visit
Admission: RE | Admit: 2018-07-31 | Discharge: 2018-07-31 | Disposition: A | Discharge status: Home / Self Care | Payer: Medicare Other | Source: Ambulatory Visit | Attending: Radiation Oncology | Admitting: Radiation Oncology

## 2018-07-31 DIAGNOSIS — C61 Malignant neoplasm of prostate: Secondary | ICD-10-CM | POA: Diagnosis not present

## 2018-07-31 DIAGNOSIS — Z510 Encounter for antineoplastic radiation therapy: Secondary | ICD-10-CM | POA: Diagnosis not present

## 2018-08-01 ENCOUNTER — Ambulatory Visit
Admission: RE | Admit: 2018-08-01 | Discharge: 2018-08-01 | Disposition: A | Discharge status: Home / Self Care | Payer: Medicare Other | Source: Ambulatory Visit | Attending: Radiation Oncology | Admitting: Radiation Oncology

## 2018-08-01 ENCOUNTER — Ambulatory Visit: Payer: Medicare Other

## 2018-08-01 DIAGNOSIS — C61 Malignant neoplasm of prostate: Secondary | ICD-10-CM | POA: Diagnosis not present

## 2018-08-01 DIAGNOSIS — N2581 Secondary hyperparathyroidism of renal origin: Secondary | ICD-10-CM | POA: Diagnosis not present

## 2018-08-01 DIAGNOSIS — Z992 Dependence on renal dialysis: Secondary | ICD-10-CM | POA: Diagnosis not present

## 2018-08-01 DIAGNOSIS — N186 End stage renal disease: Secondary | ICD-10-CM | POA: Diagnosis not present

## 2018-08-01 DIAGNOSIS — E1129 Type 2 diabetes mellitus with other diabetic kidney complication: Secondary | ICD-10-CM | POA: Diagnosis not present

## 2018-08-01 DIAGNOSIS — D509 Iron deficiency anemia, unspecified: Secondary | ICD-10-CM | POA: Diagnosis not present

## 2018-08-01 DIAGNOSIS — Z510 Encounter for antineoplastic radiation therapy: Secondary | ICD-10-CM | POA: Diagnosis not present

## 2018-08-01 DIAGNOSIS — D631 Anemia in chronic kidney disease: Secondary | ICD-10-CM | POA: Diagnosis not present

## 2018-08-04 ENCOUNTER — Ambulatory Visit
Admission: RE | Admit: 2018-08-04 | Discharge: 2018-08-04 | Disposition: A | Discharge status: Home / Self Care | Payer: Medicare Other | Source: Ambulatory Visit | Attending: Radiation Oncology | Admitting: Radiation Oncology

## 2018-08-04 ENCOUNTER — Ambulatory Visit: Payer: Medicare Other

## 2018-08-04 DIAGNOSIS — C61 Malignant neoplasm of prostate: Secondary | ICD-10-CM | POA: Diagnosis not present

## 2018-08-04 DIAGNOSIS — Z992 Dependence on renal dialysis: Secondary | ICD-10-CM | POA: Diagnosis not present

## 2018-08-04 DIAGNOSIS — Z510 Encounter for antineoplastic radiation therapy: Secondary | ICD-10-CM | POA: Diagnosis not present

## 2018-08-04 DIAGNOSIS — N186 End stage renal disease: Secondary | ICD-10-CM | POA: Diagnosis not present

## 2018-08-04 DIAGNOSIS — N2581 Secondary hyperparathyroidism of renal origin: Secondary | ICD-10-CM | POA: Diagnosis not present

## 2018-08-04 DIAGNOSIS — E1129 Type 2 diabetes mellitus with other diabetic kidney complication: Secondary | ICD-10-CM | POA: Diagnosis not present

## 2018-08-04 DIAGNOSIS — D509 Iron deficiency anemia, unspecified: Secondary | ICD-10-CM | POA: Diagnosis not present

## 2018-08-04 DIAGNOSIS — D631 Anemia in chronic kidney disease: Secondary | ICD-10-CM | POA: Diagnosis not present

## 2018-08-05 ENCOUNTER — Ambulatory Visit: Payer: Medicare Other

## 2018-08-05 ENCOUNTER — Ambulatory Visit
Admission: RE | Admit: 2018-08-05 | Discharge: 2018-08-05 | Disposition: A | Discharge status: Home / Self Care | Payer: Medicare Other | Source: Ambulatory Visit | Attending: Radiation Oncology | Admitting: Radiation Oncology

## 2018-08-05 DIAGNOSIS — C61 Malignant neoplasm of prostate: Secondary | ICD-10-CM | POA: Diagnosis not present

## 2018-08-05 DIAGNOSIS — Z510 Encounter for antineoplastic radiation therapy: Secondary | ICD-10-CM | POA: Diagnosis not present

## 2018-08-06 ENCOUNTER — Ambulatory Visit
Admission: RE | Admit: 2018-08-06 | Discharge: 2018-08-06 | Disposition: A | Discharge status: Home / Self Care | Payer: Medicare Other | Source: Ambulatory Visit | Attending: Radiation Oncology | Admitting: Radiation Oncology

## 2018-08-06 ENCOUNTER — Ambulatory Visit: Payer: Medicare Other

## 2018-08-06 DIAGNOSIS — D631 Anemia in chronic kidney disease: Secondary | ICD-10-CM | POA: Diagnosis not present

## 2018-08-06 DIAGNOSIS — Z510 Encounter for antineoplastic radiation therapy: Secondary | ICD-10-CM | POA: Diagnosis not present

## 2018-08-06 DIAGNOSIS — N2581 Secondary hyperparathyroidism of renal origin: Secondary | ICD-10-CM | POA: Diagnosis not present

## 2018-08-06 DIAGNOSIS — Z992 Dependence on renal dialysis: Secondary | ICD-10-CM | POA: Diagnosis not present

## 2018-08-06 DIAGNOSIS — E1129 Type 2 diabetes mellitus with other diabetic kidney complication: Secondary | ICD-10-CM | POA: Diagnosis not present

## 2018-08-06 DIAGNOSIS — N186 End stage renal disease: Secondary | ICD-10-CM | POA: Diagnosis not present

## 2018-08-06 DIAGNOSIS — D509 Iron deficiency anemia, unspecified: Secondary | ICD-10-CM | POA: Diagnosis not present

## 2018-08-06 DIAGNOSIS — C61 Malignant neoplasm of prostate: Secondary | ICD-10-CM | POA: Diagnosis not present

## 2018-08-07 ENCOUNTER — Ambulatory Visit
Admission: RE | Admit: 2018-08-07 | Discharge: 2018-08-07 | Disposition: A | Discharge status: Home / Self Care | Payer: Medicare Other | Source: Ambulatory Visit | Attending: Radiation Oncology | Admitting: Radiation Oncology

## 2018-08-07 ENCOUNTER — Ambulatory Visit: Payer: Medicare Other

## 2018-08-07 DIAGNOSIS — Z510 Encounter for antineoplastic radiation therapy: Secondary | ICD-10-CM | POA: Diagnosis not present

## 2018-08-07 DIAGNOSIS — C61 Malignant neoplasm of prostate: Secondary | ICD-10-CM | POA: Diagnosis not present

## 2018-08-08 ENCOUNTER — Ambulatory Visit
Admission: RE | Admit: 2018-08-08 | Discharge: 2018-08-08 | Disposition: A | Discharge status: Home / Self Care | Payer: Medicare Other | Source: Ambulatory Visit | Attending: Radiation Oncology | Admitting: Radiation Oncology

## 2018-08-08 ENCOUNTER — Ambulatory Visit: Payer: Medicare Other

## 2018-08-08 DIAGNOSIS — Z992 Dependence on renal dialysis: Secondary | ICD-10-CM | POA: Diagnosis not present

## 2018-08-08 DIAGNOSIS — E1129 Type 2 diabetes mellitus with other diabetic kidney complication: Secondary | ICD-10-CM | POA: Diagnosis not present

## 2018-08-08 DIAGNOSIS — Z510 Encounter for antineoplastic radiation therapy: Secondary | ICD-10-CM | POA: Diagnosis not present

## 2018-08-08 DIAGNOSIS — C61 Malignant neoplasm of prostate: Secondary | ICD-10-CM | POA: Diagnosis not present

## 2018-08-08 DIAGNOSIS — D509 Iron deficiency anemia, unspecified: Secondary | ICD-10-CM | POA: Diagnosis not present

## 2018-08-08 DIAGNOSIS — D631 Anemia in chronic kidney disease: Secondary | ICD-10-CM | POA: Diagnosis not present

## 2018-08-08 DIAGNOSIS — N2581 Secondary hyperparathyroidism of renal origin: Secondary | ICD-10-CM | POA: Diagnosis not present

## 2018-08-08 DIAGNOSIS — N186 End stage renal disease: Secondary | ICD-10-CM | POA: Diagnosis not present

## 2018-08-11 ENCOUNTER — Ambulatory Visit: Payer: Medicare Other

## 2018-08-11 ENCOUNTER — Ambulatory Visit
Admission: RE | Admit: 2018-08-11 | Discharge: 2018-08-11 | Disposition: A | Discharge status: Home / Self Care | Payer: Medicare Other | Source: Ambulatory Visit | Attending: Radiation Oncology | Admitting: Radiation Oncology

## 2018-08-11 DIAGNOSIS — Z992 Dependence on renal dialysis: Secondary | ICD-10-CM | POA: Diagnosis not present

## 2018-08-11 DIAGNOSIS — N2581 Secondary hyperparathyroidism of renal origin: Secondary | ICD-10-CM | POA: Diagnosis not present

## 2018-08-11 DIAGNOSIS — C61 Malignant neoplasm of prostate: Secondary | ICD-10-CM | POA: Diagnosis not present

## 2018-08-11 DIAGNOSIS — D509 Iron deficiency anemia, unspecified: Secondary | ICD-10-CM | POA: Diagnosis not present

## 2018-08-11 DIAGNOSIS — E1129 Type 2 diabetes mellitus with other diabetic kidney complication: Secondary | ICD-10-CM | POA: Diagnosis not present

## 2018-08-11 DIAGNOSIS — Z510 Encounter for antineoplastic radiation therapy: Secondary | ICD-10-CM | POA: Diagnosis not present

## 2018-08-11 DIAGNOSIS — D631 Anemia in chronic kidney disease: Secondary | ICD-10-CM | POA: Diagnosis not present

## 2018-08-11 DIAGNOSIS — N186 End stage renal disease: Secondary | ICD-10-CM | POA: Diagnosis not present

## 2018-08-12 ENCOUNTER — Ambulatory Visit
Admission: RE | Admit: 2018-08-12 | Discharge: 2018-08-12 | Disposition: A | Discharge status: Home / Self Care | Payer: Medicare Other | Source: Ambulatory Visit | Attending: Radiation Oncology | Admitting: Radiation Oncology

## 2018-08-12 ENCOUNTER — Ambulatory Visit: Payer: Medicare Other

## 2018-08-12 DIAGNOSIS — C61 Malignant neoplasm of prostate: Secondary | ICD-10-CM | POA: Diagnosis not present

## 2018-08-12 DIAGNOSIS — Z510 Encounter for antineoplastic radiation therapy: Secondary | ICD-10-CM | POA: Diagnosis not present

## 2018-08-13 ENCOUNTER — Ambulatory Visit: Payer: Medicare Other

## 2018-08-13 ENCOUNTER — Ambulatory Visit
Admission: RE | Admit: 2018-08-13 | Discharge: 2018-08-13 | Disposition: A | Discharge status: Home / Self Care | Payer: Medicare Other | Source: Ambulatory Visit | Attending: Radiation Oncology | Admitting: Radiation Oncology

## 2018-08-13 DIAGNOSIS — N186 End stage renal disease: Secondary | ICD-10-CM | POA: Diagnosis not present

## 2018-08-13 DIAGNOSIS — D509 Iron deficiency anemia, unspecified: Secondary | ICD-10-CM | POA: Diagnosis not present

## 2018-08-13 DIAGNOSIS — C61 Malignant neoplasm of prostate: Secondary | ICD-10-CM | POA: Diagnosis not present

## 2018-08-13 DIAGNOSIS — D631 Anemia in chronic kidney disease: Secondary | ICD-10-CM | POA: Diagnosis not present

## 2018-08-13 DIAGNOSIS — Z510 Encounter for antineoplastic radiation therapy: Secondary | ICD-10-CM | POA: Diagnosis not present

## 2018-08-13 DIAGNOSIS — N2581 Secondary hyperparathyroidism of renal origin: Secondary | ICD-10-CM | POA: Diagnosis not present

## 2018-08-13 DIAGNOSIS — E1129 Type 2 diabetes mellitus with other diabetic kidney complication: Secondary | ICD-10-CM | POA: Diagnosis not present

## 2018-08-13 DIAGNOSIS — Z992 Dependence on renal dialysis: Secondary | ICD-10-CM | POA: Diagnosis not present

## 2018-08-14 ENCOUNTER — Ambulatory Visit: Payer: Medicare Other

## 2018-08-14 ENCOUNTER — Telehealth: Payer: Self-pay | Admitting: Radiation Oncology

## 2018-08-14 NOTE — Telephone Encounter (Signed)
Patient did not show for radiation treatment today. Phoned patient to inquire. No answer. Left message requesting return call. Understand from Faith, RT that attempts on their behalf to reach patient have been unsuccessful as well.

## 2018-08-15 ENCOUNTER — Ambulatory Visit: Payer: Medicare Other

## 2018-08-15 ENCOUNTER — Ambulatory Visit
Admission: RE | Admit: 2018-08-15 | Discharge: 2018-08-15 | Disposition: A | Discharge status: Home / Self Care | Payer: Medicare Other | Source: Ambulatory Visit | Attending: Radiation Oncology | Admitting: Radiation Oncology

## 2018-08-15 DIAGNOSIS — N2581 Secondary hyperparathyroidism of renal origin: Secondary | ICD-10-CM | POA: Diagnosis not present

## 2018-08-15 DIAGNOSIS — D631 Anemia in chronic kidney disease: Secondary | ICD-10-CM | POA: Diagnosis not present

## 2018-08-15 DIAGNOSIS — C61 Malignant neoplasm of prostate: Secondary | ICD-10-CM | POA: Diagnosis not present

## 2018-08-15 DIAGNOSIS — E1129 Type 2 diabetes mellitus with other diabetic kidney complication: Secondary | ICD-10-CM | POA: Diagnosis not present

## 2018-08-15 DIAGNOSIS — N186 End stage renal disease: Secondary | ICD-10-CM | POA: Diagnosis not present

## 2018-08-15 DIAGNOSIS — Z510 Encounter for antineoplastic radiation therapy: Secondary | ICD-10-CM | POA: Diagnosis not present

## 2018-08-15 DIAGNOSIS — D509 Iron deficiency anemia, unspecified: Secondary | ICD-10-CM | POA: Diagnosis not present

## 2018-08-15 DIAGNOSIS — Z992 Dependence on renal dialysis: Secondary | ICD-10-CM | POA: Diagnosis not present

## 2018-08-17 DIAGNOSIS — D631 Anemia in chronic kidney disease: Secondary | ICD-10-CM | POA: Diagnosis not present

## 2018-08-17 DIAGNOSIS — E1129 Type 2 diabetes mellitus with other diabetic kidney complication: Secondary | ICD-10-CM | POA: Diagnosis not present

## 2018-08-17 DIAGNOSIS — N186 End stage renal disease: Secondary | ICD-10-CM | POA: Diagnosis not present

## 2018-08-17 DIAGNOSIS — Z992 Dependence on renal dialysis: Secondary | ICD-10-CM | POA: Diagnosis not present

## 2018-08-17 DIAGNOSIS — N2581 Secondary hyperparathyroidism of renal origin: Secondary | ICD-10-CM | POA: Diagnosis not present

## 2018-08-17 DIAGNOSIS — D509 Iron deficiency anemia, unspecified: Secondary | ICD-10-CM | POA: Diagnosis not present

## 2018-08-18 ENCOUNTER — Ambulatory Visit
Admission: RE | Admit: 2018-08-18 | Discharge: 2018-08-18 | Disposition: A | Discharge status: Home / Self Care | Payer: Medicare Other | Source: Ambulatory Visit | Attending: Radiation Oncology | Admitting: Radiation Oncology

## 2018-08-18 ENCOUNTER — Ambulatory Visit: Payer: Medicare Other

## 2018-08-18 DIAGNOSIS — Z510 Encounter for antineoplastic radiation therapy: Secondary | ICD-10-CM | POA: Diagnosis not present

## 2018-08-18 DIAGNOSIS — C61 Malignant neoplasm of prostate: Secondary | ICD-10-CM | POA: Diagnosis not present

## 2018-08-19 ENCOUNTER — Ambulatory Visit
Admission: RE | Admit: 2018-08-19 | Discharge: 2018-08-19 | Disposition: A | Discharge status: Home / Self Care | Payer: Medicare Other | Source: Ambulatory Visit | Attending: Radiation Oncology | Admitting: Radiation Oncology

## 2018-08-19 ENCOUNTER — Ambulatory Visit: Payer: Medicare Other

## 2018-08-19 DIAGNOSIS — E1129 Type 2 diabetes mellitus with other diabetic kidney complication: Secondary | ICD-10-CM | POA: Diagnosis not present

## 2018-08-19 DIAGNOSIS — C61 Malignant neoplasm of prostate: Secondary | ICD-10-CM | POA: Diagnosis not present

## 2018-08-19 DIAGNOSIS — D509 Iron deficiency anemia, unspecified: Secondary | ICD-10-CM | POA: Diagnosis not present

## 2018-08-19 DIAGNOSIS — Z992 Dependence on renal dialysis: Secondary | ICD-10-CM | POA: Diagnosis not present

## 2018-08-19 DIAGNOSIS — D631 Anemia in chronic kidney disease: Secondary | ICD-10-CM | POA: Diagnosis not present

## 2018-08-19 DIAGNOSIS — Z510 Encounter for antineoplastic radiation therapy: Secondary | ICD-10-CM | POA: Diagnosis not present

## 2018-08-19 DIAGNOSIS — N2581 Secondary hyperparathyroidism of renal origin: Secondary | ICD-10-CM | POA: Diagnosis not present

## 2018-08-19 DIAGNOSIS — N186 End stage renal disease: Secondary | ICD-10-CM | POA: Diagnosis not present

## 2018-08-21 ENCOUNTER — Ambulatory Visit: Payer: Medicare Other

## 2018-08-21 ENCOUNTER — Ambulatory Visit
Admission: RE | Admit: 2018-08-21 | Discharge: 2018-08-21 | Disposition: A | Discharge status: Home / Self Care | Payer: Medicare Other | Source: Ambulatory Visit | Attending: Radiation Oncology | Admitting: Radiation Oncology

## 2018-08-21 DIAGNOSIS — C61 Malignant neoplasm of prostate: Secondary | ICD-10-CM | POA: Diagnosis not present

## 2018-08-21 DIAGNOSIS — Z510 Encounter for antineoplastic radiation therapy: Secondary | ICD-10-CM | POA: Diagnosis not present

## 2018-08-22 ENCOUNTER — Ambulatory Visit
Admission: RE | Admit: 2018-08-22 | Discharge: 2018-08-22 | Disposition: A | Discharge status: Home / Self Care | Payer: Medicare Other | Source: Ambulatory Visit | Attending: Radiation Oncology | Admitting: Radiation Oncology

## 2018-08-22 ENCOUNTER — Ambulatory Visit: Payer: Medicare Other

## 2018-08-22 DIAGNOSIS — E1129 Type 2 diabetes mellitus with other diabetic kidney complication: Secondary | ICD-10-CM | POA: Diagnosis not present

## 2018-08-22 DIAGNOSIS — N2581 Secondary hyperparathyroidism of renal origin: Secondary | ICD-10-CM | POA: Diagnosis not present

## 2018-08-22 DIAGNOSIS — Z992 Dependence on renal dialysis: Secondary | ICD-10-CM | POA: Diagnosis not present

## 2018-08-22 DIAGNOSIS — D509 Iron deficiency anemia, unspecified: Secondary | ICD-10-CM | POA: Diagnosis not present

## 2018-08-22 DIAGNOSIS — C61 Malignant neoplasm of prostate: Secondary | ICD-10-CM | POA: Diagnosis not present

## 2018-08-22 DIAGNOSIS — D631 Anemia in chronic kidney disease: Secondary | ICD-10-CM | POA: Diagnosis not present

## 2018-08-22 DIAGNOSIS — Z510 Encounter for antineoplastic radiation therapy: Secondary | ICD-10-CM | POA: Diagnosis not present

## 2018-08-22 DIAGNOSIS — N186 End stage renal disease: Secondary | ICD-10-CM | POA: Diagnosis not present

## 2018-08-24 DIAGNOSIS — N186 End stage renal disease: Secondary | ICD-10-CM | POA: Diagnosis not present

## 2018-08-24 DIAGNOSIS — Z992 Dependence on renal dialysis: Secondary | ICD-10-CM | POA: Diagnosis not present

## 2018-08-24 DIAGNOSIS — D631 Anemia in chronic kidney disease: Secondary | ICD-10-CM | POA: Diagnosis not present

## 2018-08-24 DIAGNOSIS — D509 Iron deficiency anemia, unspecified: Secondary | ICD-10-CM | POA: Diagnosis not present

## 2018-08-24 DIAGNOSIS — N2581 Secondary hyperparathyroidism of renal origin: Secondary | ICD-10-CM | POA: Diagnosis not present

## 2018-08-24 DIAGNOSIS — E1129 Type 2 diabetes mellitus with other diabetic kidney complication: Secondary | ICD-10-CM | POA: Diagnosis not present

## 2018-08-25 ENCOUNTER — Ambulatory Visit: Payer: Medicare Other

## 2018-08-25 ENCOUNTER — Ambulatory Visit
Admission: RE | Admit: 2018-08-25 | Discharge: 2018-08-25 | Disposition: A | Discharge status: Home / Self Care | Payer: Medicare Other | Source: Ambulatory Visit | Attending: Radiation Oncology | Admitting: Radiation Oncology

## 2018-08-25 DIAGNOSIS — C61 Malignant neoplasm of prostate: Secondary | ICD-10-CM | POA: Diagnosis not present

## 2018-08-25 DIAGNOSIS — Z510 Encounter for antineoplastic radiation therapy: Secondary | ICD-10-CM | POA: Diagnosis not present

## 2018-08-26 ENCOUNTER — Ambulatory Visit: Payer: Medicare Other

## 2018-08-26 ENCOUNTER — Ambulatory Visit
Admission: RE | Admit: 2018-08-26 | Discharge: 2018-08-26 | Disposition: A | Discharge status: Home / Self Care | Payer: Medicare Other | Source: Ambulatory Visit | Attending: Radiation Oncology | Admitting: Radiation Oncology

## 2018-08-26 DIAGNOSIS — Z510 Encounter for antineoplastic radiation therapy: Secondary | ICD-10-CM | POA: Diagnosis not present

## 2018-08-26 DIAGNOSIS — D631 Anemia in chronic kidney disease: Secondary | ICD-10-CM | POA: Diagnosis not present

## 2018-08-26 DIAGNOSIS — N186 End stage renal disease: Secondary | ICD-10-CM | POA: Diagnosis not present

## 2018-08-26 DIAGNOSIS — C61 Malignant neoplasm of prostate: Secondary | ICD-10-CM | POA: Diagnosis not present

## 2018-08-26 DIAGNOSIS — D509 Iron deficiency anemia, unspecified: Secondary | ICD-10-CM | POA: Diagnosis not present

## 2018-08-26 DIAGNOSIS — E1129 Type 2 diabetes mellitus with other diabetic kidney complication: Secondary | ICD-10-CM | POA: Diagnosis not present

## 2018-08-26 DIAGNOSIS — N2581 Secondary hyperparathyroidism of renal origin: Secondary | ICD-10-CM | POA: Diagnosis not present

## 2018-08-26 DIAGNOSIS — Z992 Dependence on renal dialysis: Secondary | ICD-10-CM | POA: Diagnosis not present

## 2018-08-27 ENCOUNTER — Ambulatory Visit: Payer: Medicare Other

## 2018-08-27 DIAGNOSIS — Z992 Dependence on renal dialysis: Secondary | ICD-10-CM | POA: Diagnosis not present

## 2018-08-27 DIAGNOSIS — T8620 Unspecified complication of heart transplant: Secondary | ICD-10-CM | POA: Diagnosis not present

## 2018-08-27 DIAGNOSIS — N186 End stage renal disease: Secondary | ICD-10-CM | POA: Diagnosis not present

## 2018-08-28 ENCOUNTER — Ambulatory Visit
Admission: RE | Admit: 2018-08-28 | Discharge: 2018-08-28 | Disposition: A | Discharge status: Home / Self Care | Payer: Medicare Other | Source: Ambulatory Visit | Attending: Radiation Oncology | Admitting: Radiation Oncology

## 2018-08-28 ENCOUNTER — Ambulatory Visit: Payer: Medicare Other

## 2018-08-28 DIAGNOSIS — C61 Malignant neoplasm of prostate: Secondary | ICD-10-CM | POA: Diagnosis not present

## 2018-08-28 DIAGNOSIS — Z510 Encounter for antineoplastic radiation therapy: Secondary | ICD-10-CM | POA: Insufficient documentation

## 2018-08-29 ENCOUNTER — Ambulatory Visit
Admission: RE | Admit: 2018-08-29 | Discharge: 2018-08-29 | Disposition: A | Discharge status: Home / Self Care | Payer: Medicare Other | Source: Ambulatory Visit | Attending: Radiation Oncology | Admitting: Radiation Oncology

## 2018-08-29 ENCOUNTER — Ambulatory Visit: Payer: Medicare Other

## 2018-08-29 DIAGNOSIS — C61 Malignant neoplasm of prostate: Secondary | ICD-10-CM | POA: Diagnosis not present

## 2018-08-29 DIAGNOSIS — Z992 Dependence on renal dialysis: Secondary | ICD-10-CM | POA: Diagnosis not present

## 2018-08-29 DIAGNOSIS — Z510 Encounter for antineoplastic radiation therapy: Secondary | ICD-10-CM | POA: Diagnosis not present

## 2018-08-29 DIAGNOSIS — N186 End stage renal disease: Secondary | ICD-10-CM | POA: Diagnosis not present

## 2018-08-29 DIAGNOSIS — D509 Iron deficiency anemia, unspecified: Secondary | ICD-10-CM | POA: Diagnosis not present

## 2018-08-29 DIAGNOSIS — N2581 Secondary hyperparathyroidism of renal origin: Secondary | ICD-10-CM | POA: Diagnosis not present

## 2018-08-29 DIAGNOSIS — D631 Anemia in chronic kidney disease: Secondary | ICD-10-CM | POA: Diagnosis not present

## 2018-08-29 DIAGNOSIS — E1129 Type 2 diabetes mellitus with other diabetic kidney complication: Secondary | ICD-10-CM | POA: Diagnosis not present

## 2018-09-01 ENCOUNTER — Ambulatory Visit: Payer: Medicare Other

## 2018-09-01 ENCOUNTER — Ambulatory Visit
Admission: RE | Admit: 2018-09-01 | Discharge: 2018-09-01 | Disposition: A | Discharge status: Home / Self Care | Payer: Medicare Other | Source: Ambulatory Visit | Attending: Radiation Oncology | Admitting: Radiation Oncology

## 2018-09-01 DIAGNOSIS — C61 Malignant neoplasm of prostate: Secondary | ICD-10-CM | POA: Diagnosis not present

## 2018-09-01 DIAGNOSIS — N2581 Secondary hyperparathyroidism of renal origin: Secondary | ICD-10-CM | POA: Diagnosis not present

## 2018-09-01 DIAGNOSIS — N186 End stage renal disease: Secondary | ICD-10-CM | POA: Diagnosis not present

## 2018-09-01 DIAGNOSIS — Z510 Encounter for antineoplastic radiation therapy: Secondary | ICD-10-CM | POA: Diagnosis not present

## 2018-09-01 DIAGNOSIS — D509 Iron deficiency anemia, unspecified: Secondary | ICD-10-CM | POA: Diagnosis not present

## 2018-09-01 DIAGNOSIS — D631 Anemia in chronic kidney disease: Secondary | ICD-10-CM | POA: Diagnosis not present

## 2018-09-01 DIAGNOSIS — Z992 Dependence on renal dialysis: Secondary | ICD-10-CM | POA: Diagnosis not present

## 2018-09-01 DIAGNOSIS — E1129 Type 2 diabetes mellitus with other diabetic kidney complication: Secondary | ICD-10-CM | POA: Diagnosis not present

## 2018-09-02 ENCOUNTER — Ambulatory Visit
Admission: RE | Admit: 2018-09-02 | Discharge: 2018-09-02 | Disposition: A | Discharge status: Home / Self Care | Payer: Medicare Other | Source: Ambulatory Visit | Attending: Radiation Oncology | Admitting: Radiation Oncology

## 2018-09-02 ENCOUNTER — Ambulatory Visit: Payer: Medicare Other

## 2018-09-02 DIAGNOSIS — C61 Malignant neoplasm of prostate: Secondary | ICD-10-CM | POA: Diagnosis not present

## 2018-09-02 DIAGNOSIS — Z510 Encounter for antineoplastic radiation therapy: Secondary | ICD-10-CM | POA: Diagnosis not present

## 2018-09-03 ENCOUNTER — Ambulatory Visit: Payer: Medicare Other

## 2018-09-03 DIAGNOSIS — D631 Anemia in chronic kidney disease: Secondary | ICD-10-CM | POA: Diagnosis not present

## 2018-09-03 DIAGNOSIS — N2581 Secondary hyperparathyroidism of renal origin: Secondary | ICD-10-CM | POA: Diagnosis not present

## 2018-09-03 DIAGNOSIS — E1129 Type 2 diabetes mellitus with other diabetic kidney complication: Secondary | ICD-10-CM | POA: Diagnosis not present

## 2018-09-03 DIAGNOSIS — D509 Iron deficiency anemia, unspecified: Secondary | ICD-10-CM | POA: Diagnosis not present

## 2018-09-03 DIAGNOSIS — N186 End stage renal disease: Secondary | ICD-10-CM | POA: Diagnosis not present

## 2018-09-03 DIAGNOSIS — Z992 Dependence on renal dialysis: Secondary | ICD-10-CM | POA: Diagnosis not present

## 2018-09-04 ENCOUNTER — Ambulatory Visit: Payer: Medicare Other

## 2018-09-05 ENCOUNTER — Encounter: Payer: Self-pay | Admitting: Radiation Oncology

## 2018-09-05 ENCOUNTER — Ambulatory Visit: Payer: Medicare Other

## 2018-09-05 DIAGNOSIS — Z992 Dependence on renal dialysis: Secondary | ICD-10-CM | POA: Diagnosis not present

## 2018-09-05 DIAGNOSIS — N186 End stage renal disease: Secondary | ICD-10-CM | POA: Diagnosis not present

## 2018-09-05 DIAGNOSIS — D509 Iron deficiency anemia, unspecified: Secondary | ICD-10-CM | POA: Diagnosis not present

## 2018-09-05 DIAGNOSIS — N2581 Secondary hyperparathyroidism of renal origin: Secondary | ICD-10-CM | POA: Diagnosis not present

## 2018-09-05 DIAGNOSIS — E1129 Type 2 diabetes mellitus with other diabetic kidney complication: Secondary | ICD-10-CM | POA: Diagnosis not present

## 2018-09-05 DIAGNOSIS — D631 Anemia in chronic kidney disease: Secondary | ICD-10-CM | POA: Diagnosis not present

## 2018-09-05 NOTE — Progress Notes (Signed)
  Radiation Oncology         [phone]) [phone] ________________________________  Name: [name] MRN: [id number]  Date: 09/05/2018  DOB: [birth year]  End of Treatment Note  Diagnosis:   58 y.o. gentleman with stage T2a adenocarcinoma of the prostate with a Gleason's score of 3+4 and a PSA of 9.26     Indication for treatment:  Curative, Definitive Radiotherapy       Radiation treatment dates:   07/17/2018 - 09/02/2018  Site/dose:   The prostate was treated to 70 Gy in 28 fractions of 2.5 Gy  Beams/energy:   The patient was treated with IMRT using volumetric arc therapy delivering 6 MV X-rays to clockwise and counterclockwise circumferential arcs with a 90 degree collimator offset to avoid dose scalloping.  Image guidance was performed with daily cone beam CT prior to each fraction to align to gold markers in the prostate and assure proper bladder and rectal fill volumes.  Immobilization was achieved with BodyFix custom mold.  Narrative: The patient tolerated radiation treatment relatively well. He is anuric and remained on dialysis throughout treatment. He experienced moderate to severe fatigue but denied pain throughout. He endorsed some leakage with stool and excessive gas.  Plan: The patient has completed radiation treatment. He will return to radiation oncology clinic for routine followup in one month. I advised him to call or return sooner if he has any questions or concerns related to his recovery or treatment. ________________________________  Sheral Apley. Tammi Klippel, M.D.  This document serves as a record of services personally performed by Tyler Pita, MD. It was created on his behalf by Wilburn Mylar, a trained medical scribe. The creation of this record is based on the scribe's personal observations and the provider's statements to them. This document has been checked and approved by the attending provider.

## 2018-09-08 ENCOUNTER — Ambulatory Visit: Payer: Medicare Other

## 2018-09-08 DIAGNOSIS — N2581 Secondary hyperparathyroidism of renal origin: Secondary | ICD-10-CM | POA: Diagnosis not present

## 2018-09-08 DIAGNOSIS — D631 Anemia in chronic kidney disease: Secondary | ICD-10-CM | POA: Diagnosis not present

## 2018-09-08 DIAGNOSIS — N186 End stage renal disease: Secondary | ICD-10-CM | POA: Diagnosis not present

## 2018-09-08 DIAGNOSIS — Z992 Dependence on renal dialysis: Secondary | ICD-10-CM | POA: Diagnosis not present

## 2018-09-08 DIAGNOSIS — D509 Iron deficiency anemia, unspecified: Secondary | ICD-10-CM | POA: Diagnosis not present

## 2018-09-08 DIAGNOSIS — E1129 Type 2 diabetes mellitus with other diabetic kidney complication: Secondary | ICD-10-CM | POA: Diagnosis not present

## 2018-09-09 ENCOUNTER — Ambulatory Visit: Payer: Medicare Other

## 2018-09-10 ENCOUNTER — Ambulatory Visit: Payer: Medicare Other

## 2018-09-10 DIAGNOSIS — N186 End stage renal disease: Secondary | ICD-10-CM | POA: Diagnosis not present

## 2018-09-10 DIAGNOSIS — E1129 Type 2 diabetes mellitus with other diabetic kidney complication: Secondary | ICD-10-CM | POA: Diagnosis not present

## 2018-09-10 DIAGNOSIS — N2581 Secondary hyperparathyroidism of renal origin: Secondary | ICD-10-CM | POA: Diagnosis not present

## 2018-09-10 DIAGNOSIS — D631 Anemia in chronic kidney disease: Secondary | ICD-10-CM | POA: Diagnosis not present

## 2018-09-10 DIAGNOSIS — D509 Iron deficiency anemia, unspecified: Secondary | ICD-10-CM | POA: Diagnosis not present

## 2018-09-10 DIAGNOSIS — Z992 Dependence on renal dialysis: Secondary | ICD-10-CM | POA: Diagnosis not present

## 2018-09-11 ENCOUNTER — Ambulatory Visit: Payer: Medicare Other

## 2018-09-12 DIAGNOSIS — N186 End stage renal disease: Secondary | ICD-10-CM | POA: Diagnosis not present

## 2018-09-12 DIAGNOSIS — E1129 Type 2 diabetes mellitus with other diabetic kidney complication: Secondary | ICD-10-CM | POA: Diagnosis not present

## 2018-09-12 DIAGNOSIS — N2581 Secondary hyperparathyroidism of renal origin: Secondary | ICD-10-CM | POA: Diagnosis not present

## 2018-09-12 DIAGNOSIS — D509 Iron deficiency anemia, unspecified: Secondary | ICD-10-CM | POA: Diagnosis not present

## 2018-09-12 DIAGNOSIS — Z992 Dependence on renal dialysis: Secondary | ICD-10-CM | POA: Diagnosis not present

## 2018-09-12 DIAGNOSIS — D631 Anemia in chronic kidney disease: Secondary | ICD-10-CM | POA: Diagnosis not present

## 2018-09-15 ENCOUNTER — Telehealth: Payer: Self-pay

## 2018-09-15 DIAGNOSIS — N2581 Secondary hyperparathyroidism of renal origin: Secondary | ICD-10-CM | POA: Diagnosis not present

## 2018-09-15 DIAGNOSIS — Z992 Dependence on renal dialysis: Secondary | ICD-10-CM | POA: Diagnosis not present

## 2018-09-15 DIAGNOSIS — N186 End stage renal disease: Secondary | ICD-10-CM | POA: Diagnosis not present

## 2018-09-15 DIAGNOSIS — D631 Anemia in chronic kidney disease: Secondary | ICD-10-CM | POA: Diagnosis not present

## 2018-09-15 DIAGNOSIS — E1129 Type 2 diabetes mellitus with other diabetic kidney complication: Secondary | ICD-10-CM | POA: Diagnosis not present

## 2018-09-15 DIAGNOSIS — D509 Iron deficiency anemia, unspecified: Secondary | ICD-10-CM | POA: Diagnosis not present

## 2018-09-15 NOTE — Telephone Encounter (Signed)
Left a VM for [name] to call back in regards to the 12-week PREP at the [facility].

## 2018-09-16 DIAGNOSIS — I10 Essential (primary) hypertension: Secondary | ICD-10-CM | POA: Diagnosis not present

## 2018-09-16 DIAGNOSIS — E1122 Type 2 diabetes mellitus with diabetic chronic kidney disease: Secondary | ICD-10-CM | POA: Diagnosis not present

## 2018-09-16 DIAGNOSIS — G629 Polyneuropathy, unspecified: Secondary | ICD-10-CM | POA: Diagnosis not present

## 2018-09-16 DIAGNOSIS — E782 Mixed hyperlipidemia: Secondary | ICD-10-CM | POA: Diagnosis not present

## 2018-09-16 DIAGNOSIS — N185 Chronic kidney disease, stage 5: Secondary | ICD-10-CM | POA: Diagnosis not present

## 2018-09-17 DIAGNOSIS — E1129 Type 2 diabetes mellitus with other diabetic kidney complication: Secondary | ICD-10-CM | POA: Diagnosis not present

## 2018-09-17 DIAGNOSIS — N186 End stage renal disease: Secondary | ICD-10-CM | POA: Diagnosis not present

## 2018-09-17 DIAGNOSIS — D631 Anemia in chronic kidney disease: Secondary | ICD-10-CM | POA: Diagnosis not present

## 2018-09-17 DIAGNOSIS — D509 Iron deficiency anemia, unspecified: Secondary | ICD-10-CM | POA: Diagnosis not present

## 2018-09-17 DIAGNOSIS — N2581 Secondary hyperparathyroidism of renal origin: Secondary | ICD-10-CM | POA: Diagnosis not present

## 2018-09-17 DIAGNOSIS — Z992 Dependence on renal dialysis: Secondary | ICD-10-CM | POA: Diagnosis not present

## 2018-09-19 DIAGNOSIS — N2581 Secondary hyperparathyroidism of renal origin: Secondary | ICD-10-CM | POA: Diagnosis not present

## 2018-09-19 DIAGNOSIS — D509 Iron deficiency anemia, unspecified: Secondary | ICD-10-CM | POA: Diagnosis not present

## 2018-09-19 DIAGNOSIS — D631 Anemia in chronic kidney disease: Secondary | ICD-10-CM | POA: Diagnosis not present

## 2018-09-19 DIAGNOSIS — Z992 Dependence on renal dialysis: Secondary | ICD-10-CM | POA: Diagnosis not present

## 2018-09-19 DIAGNOSIS — N186 End stage renal disease: Secondary | ICD-10-CM | POA: Diagnosis not present

## 2018-09-19 DIAGNOSIS — E1129 Type 2 diabetes mellitus with other diabetic kidney complication: Secondary | ICD-10-CM | POA: Diagnosis not present

## 2018-09-22 DIAGNOSIS — N186 End stage renal disease: Secondary | ICD-10-CM | POA: Diagnosis not present

## 2018-09-22 DIAGNOSIS — D509 Iron deficiency anemia, unspecified: Secondary | ICD-10-CM | POA: Diagnosis not present

## 2018-09-22 DIAGNOSIS — Z992 Dependence on renal dialysis: Secondary | ICD-10-CM | POA: Diagnosis not present

## 2018-09-22 DIAGNOSIS — N2581 Secondary hyperparathyroidism of renal origin: Secondary | ICD-10-CM | POA: Diagnosis not present

## 2018-09-22 DIAGNOSIS — D631 Anemia in chronic kidney disease: Secondary | ICD-10-CM | POA: Diagnosis not present

## 2018-09-22 DIAGNOSIS — E1129 Type 2 diabetes mellitus with other diabetic kidney complication: Secondary | ICD-10-CM | POA: Diagnosis not present

## 2018-09-24 DIAGNOSIS — D509 Iron deficiency anemia, unspecified: Secondary | ICD-10-CM | POA: Diagnosis not present

## 2018-09-24 DIAGNOSIS — D631 Anemia in chronic kidney disease: Secondary | ICD-10-CM | POA: Diagnosis not present

## 2018-09-24 DIAGNOSIS — Z992 Dependence on renal dialysis: Secondary | ICD-10-CM | POA: Diagnosis not present

## 2018-09-24 DIAGNOSIS — N186 End stage renal disease: Secondary | ICD-10-CM | POA: Diagnosis not present

## 2018-09-24 DIAGNOSIS — N2581 Secondary hyperparathyroidism of renal origin: Secondary | ICD-10-CM | POA: Diagnosis not present

## 2018-09-24 DIAGNOSIS — E1129 Type 2 diabetes mellitus with other diabetic kidney complication: Secondary | ICD-10-CM | POA: Diagnosis not present

## 2018-09-26 DIAGNOSIS — N186 End stage renal disease: Secondary | ICD-10-CM | POA: Diagnosis not present

## 2018-09-26 DIAGNOSIS — D631 Anemia in chronic kidney disease: Secondary | ICD-10-CM | POA: Diagnosis not present

## 2018-09-26 DIAGNOSIS — D509 Iron deficiency anemia, unspecified: Secondary | ICD-10-CM | POA: Diagnosis not present

## 2018-09-26 DIAGNOSIS — N2581 Secondary hyperparathyroidism of renal origin: Secondary | ICD-10-CM | POA: Diagnosis not present

## 2018-09-26 DIAGNOSIS — Z992 Dependence on renal dialysis: Secondary | ICD-10-CM | POA: Diagnosis not present

## 2018-09-26 DIAGNOSIS — E1129 Type 2 diabetes mellitus with other diabetic kidney complication: Secondary | ICD-10-CM | POA: Diagnosis not present

## 2018-09-27 DIAGNOSIS — Z992 Dependence on renal dialysis: Secondary | ICD-10-CM | POA: Diagnosis not present

## 2018-09-27 DIAGNOSIS — N186 End stage renal disease: Secondary | ICD-10-CM | POA: Diagnosis not present

## 2018-09-27 DIAGNOSIS — T8620 Unspecified complication of heart transplant: Secondary | ICD-10-CM | POA: Diagnosis not present

## 2018-09-29 DIAGNOSIS — Z992 Dependence on renal dialysis: Secondary | ICD-10-CM | POA: Diagnosis not present

## 2018-09-29 DIAGNOSIS — E1129 Type 2 diabetes mellitus with other diabetic kidney complication: Secondary | ICD-10-CM | POA: Diagnosis not present

## 2018-09-29 DIAGNOSIS — D631 Anemia in chronic kidney disease: Secondary | ICD-10-CM | POA: Diagnosis not present

## 2018-09-29 DIAGNOSIS — D509 Iron deficiency anemia, unspecified: Secondary | ICD-10-CM | POA: Diagnosis not present

## 2018-09-29 DIAGNOSIS — N186 End stage renal disease: Secondary | ICD-10-CM | POA: Diagnosis not present

## 2018-09-29 DIAGNOSIS — N2581 Secondary hyperparathyroidism of renal origin: Secondary | ICD-10-CM | POA: Diagnosis not present

## 2018-10-01 DIAGNOSIS — D631 Anemia in chronic kidney disease: Secondary | ICD-10-CM | POA: Diagnosis not present

## 2018-10-01 DIAGNOSIS — E1129 Type 2 diabetes mellitus with other diabetic kidney complication: Secondary | ICD-10-CM | POA: Diagnosis not present

## 2018-10-01 DIAGNOSIS — N186 End stage renal disease: Secondary | ICD-10-CM | POA: Diagnosis not present

## 2018-10-01 DIAGNOSIS — N2581 Secondary hyperparathyroidism of renal origin: Secondary | ICD-10-CM | POA: Diagnosis not present

## 2018-10-01 DIAGNOSIS — Z992 Dependence on renal dialysis: Secondary | ICD-10-CM | POA: Diagnosis not present

## 2018-10-01 DIAGNOSIS — D509 Iron deficiency anemia, unspecified: Secondary | ICD-10-CM | POA: Diagnosis not present

## 2018-10-03 DIAGNOSIS — D631 Anemia in chronic kidney disease: Secondary | ICD-10-CM | POA: Diagnosis not present

## 2018-10-03 DIAGNOSIS — N186 End stage renal disease: Secondary | ICD-10-CM | POA: Diagnosis not present

## 2018-10-03 DIAGNOSIS — D509 Iron deficiency anemia, unspecified: Secondary | ICD-10-CM | POA: Diagnosis not present

## 2018-10-03 DIAGNOSIS — Z992 Dependence on renal dialysis: Secondary | ICD-10-CM | POA: Diagnosis not present

## 2018-10-03 DIAGNOSIS — N2581 Secondary hyperparathyroidism of renal origin: Secondary | ICD-10-CM | POA: Diagnosis not present

## 2018-10-03 DIAGNOSIS — E1129 Type 2 diabetes mellitus with other diabetic kidney complication: Secondary | ICD-10-CM | POA: Diagnosis not present

## 2018-10-06 DIAGNOSIS — N186 End stage renal disease: Secondary | ICD-10-CM | POA: Diagnosis not present

## 2018-10-06 DIAGNOSIS — N2581 Secondary hyperparathyroidism of renal origin: Secondary | ICD-10-CM | POA: Diagnosis not present

## 2018-10-06 DIAGNOSIS — E1129 Type 2 diabetes mellitus with other diabetic kidney complication: Secondary | ICD-10-CM | POA: Diagnosis not present

## 2018-10-06 DIAGNOSIS — D631 Anemia in chronic kidney disease: Secondary | ICD-10-CM | POA: Diagnosis not present

## 2018-10-06 DIAGNOSIS — D509 Iron deficiency anemia, unspecified: Secondary | ICD-10-CM | POA: Diagnosis not present

## 2018-10-06 DIAGNOSIS — Z992 Dependence on renal dialysis: Secondary | ICD-10-CM | POA: Diagnosis not present

## 2018-10-08 ENCOUNTER — Ambulatory Visit
Admission: RE | Admit: 2018-10-08 | Discharge: 2018-10-08 | Disposition: A | Discharge status: Home / Self Care | Payer: Medicare Other | Source: Ambulatory Visit | Attending: Urology | Admitting: Urology

## 2018-10-08 ENCOUNTER — Other Ambulatory Visit: Payer: Self-pay

## 2018-10-08 VITALS — BP 116/70 | HR 91 | Temp 97.7°F | Wt 321.2 lb

## 2018-10-08 DIAGNOSIS — N186 End stage renal disease: Secondary | ICD-10-CM | POA: Diagnosis not present

## 2018-10-08 DIAGNOSIS — N2581 Secondary hyperparathyroidism of renal origin: Secondary | ICD-10-CM | POA: Diagnosis not present

## 2018-10-08 DIAGNOSIS — Z923 Personal history of irradiation: Secondary | ICD-10-CM | POA: Diagnosis not present

## 2018-10-08 DIAGNOSIS — R34 Anuria and oliguria: Secondary | ICD-10-CM | POA: Insufficient documentation

## 2018-10-08 DIAGNOSIS — Z7982 Long term (current) use of aspirin: Secondary | ICD-10-CM | POA: Insufficient documentation

## 2018-10-08 DIAGNOSIS — C61 Malignant neoplasm of prostate: Secondary | ICD-10-CM

## 2018-10-08 DIAGNOSIS — D631 Anemia in chronic kidney disease: Secondary | ICD-10-CM | POA: Diagnosis not present

## 2018-10-08 DIAGNOSIS — E1129 Type 2 diabetes mellitus with other diabetic kidney complication: Secondary | ICD-10-CM | POA: Diagnosis not present

## 2018-10-08 DIAGNOSIS — Z79899 Other long term (current) drug therapy: Secondary | ICD-10-CM | POA: Diagnosis not present

## 2018-10-08 DIAGNOSIS — D509 Iron deficiency anemia, unspecified: Secondary | ICD-10-CM | POA: Diagnosis not present

## 2018-10-08 DIAGNOSIS — Z992 Dependence on renal dialysis: Secondary | ICD-10-CM | POA: Insufficient documentation

## 2018-10-08 NOTE — Progress Notes (Signed)
Radiation Oncology         [phone] ________________________________  Name: [name] MRN: [id number]  Date: 10/08/2018  DOB: 12/17/1960  Post Treatment Note  CC: Carol Ada, MD  Raynelle Bring, MD  Diagnosis:   58 y.o. gentleman with stage T2a adenocarcinoma of the prostate with a Gleason's score of 3+4 and a PSA of 9.26     Interval Since Last Radiation:  5 weeks  07/17/2018 - 09/02/2018:   The prostate was treated to 70 Gy in 28 fractions of 2.5 Gy  Narrative:  The patient returns today for routine follow-up.  He tolerated radiation treatment relatively well. He is anuric and remained on dialysis throughout treatment. He experienced moderate to severe fatigue but denied pain throughout. He endorsed some leakage with stool and excessive gas during treatment.   He was maintained on ADT throughout his course of radiation treatments.                           On review of systems, the patient states that he is doing very well overall.  He reports almost complete resolution of his LUTS and bowel irritation.  He specifically denies dysuria, blood from the penis, excessive fatigue, abdominal pain, nausea, vomiting, diarrhea or constipation.  He continues to tolerate the ADT very well with only mild hot flashes and fatigue.  He reports a healthy appetite and is maintaining his weight.  ALLERGIES:  is allergic to heparin and penicillin g potassium [penicillin g].  Meds: Current Outpatient Medications  Medication Sig Dispense Refill  . aspirin 81 MG chewable tablet Chew 1 tablet (81 mg total) by mouth 2 (two) times daily. 60 tablet 1  . B Complex-C-Folic Acid (RENO CAPS PO) TAKE 1 CAPSULE BY MOUTH ONCE DAILY.    . B Complex-C-Folic Acid (RENO CAPS) 1 MG CAPS Take 1 capsule by mouth daily.  3  . b complex-vitamin c-folic acid (NEPHRO-VITE) 0.8 MG TABS tablet TAKE 1 TABLET BY MOUTH EVERY DAY (ON DIALYSIS DAYS, TAKE AFTER DIALYSIS TREATMENT)  4  . cephALEXin (KEFLEX) 500 MG capsule Take  1 capsule (500 mg total) by mouth 3 (three) times daily. 30 capsule 1  . cinacalcet (SENSIPAR) 30 MG tablet Take 30 mg by mouth daily.    . citalopram (CELEXA) 20 MG tablet TAKE 1 TABLET(20 MG) BY MOUTH EVERY DAY    . Cyanocobalamin (NASCOBAL) 500 MCG/0.1ML SOLN Place 500 mcg into the nose daily as needed (energy boost).    . cycloSPORINE modified (NEORAL) 100 MG capsule Take 100 mg by mouth See admin instructions. Take one capsule (100 mg) by mouth twice daily with 3 capsules (75 mg) for a total dose of 175 mg twice daily (both modified)    . docusate sodium (COLACE) 100 MG capsule Take 1 capsule (100 mg total) by mouth 2 (two) times daily. (Patient taking differently: Take 100 mg by mouth 2 (two) times daily as needed (constipation). ) 60 capsule 1  . HYDROcodone-acetaminophen (NORCO/VICODIN) 5-325 MG tablet hydrocodone 5 mg-acetaminophen 325 mg tablet    . lidocaine (XYLOCAINE) 5 % ointment Apply 1 application topically as needed. Apply to feet twice daily as needed. 50 g 5  . lidocaine-prilocaine (EMLA) cream APPLY SMALL AMOUNT TO ACCESS SITE (AVF) 1 TO 2 HOURS BEFORE DIALYSIS. COVER WITH OCCLUSIVE DRESSING (SARAN WRAP)  4  . mycophenolate (MYFORTIC) 360 MG TBEC EC tablet Take 720 mg by mouth 2 (two) times daily.    Marland Kitchen  omeprazole (PRILOSEC) 20 MG capsule TAKE 20 mg BY MOUTH DAILY    . ondansetron (ZOFRAN) 4 MG tablet Take 1 tablet (4 mg total) by mouth every 6 (six) hours as needed for nausea. 20 tablet 0  . oxyCODONE (OXY IR/ROXICODONE) 5 MG immediate release tablet Take 1-2 tablets (5-10 mg total) by mouth every 6 (six) hours as needed for breakthrough pain. 60 tablet 0  . polyethylene glycol (GAVILYTE-G) 236 g solution GaviLyte-G 236 gram-22.74 gram-6.74 gram-5.86 gram oral solution    . pravastatin (PRAVACHOL) 40 MG tablet Take 1 tablet (40 mg total) by mouth daily at 6 PM. (Patient taking differently: Take 40 mg by mouth at bedtime. ) 30 tablet 5  . pregabalin (LYRICA) 100 MG capsule Take 100  mg by mouth 3 (three) times daily.    Marland Kitchen senna (SENOKOT) 8.6 MG TABS tablet Take 2 tablets (17.2 mg total) by mouth at bedtime. 120 each 0  . sevelamer carbonate (RENVELA) 800 MG tablet Take 800-2,400 mg by mouth See admin instructions. Take 2-3 tablets (1600-2400 mg) by mouth three times daily with meals, take 1 tablet (800 mg) with snacks    . tadalafil (CIALIS) 20 MG tablet Take 20 mg by mouth daily as needed for erectile dysfunction.    . VELPHORO 500 MG chewable tablet CHEW & SWALLOW 2 TABLETS BY MOUTH THREE TIMES A DAY WITH MEALS  3  . venlafaxine (EFFEXOR) 37.5 MG tablet Take half tablet x 1 month, then increase to 1 tablet daily.  On days of dialysis, take afterwards. 30 tablet 5   No current facility-administered medications for this encounter.     Physical Findings:  weight is 321 lb 3.2 oz (145.7 kg) (abnormal). His oral temperature is 97.7 F (36.5 C). His blood pressure is 116/70 and his pulse is 91. His oxygen saturation is 100%.  Pain Assessment Pain Score: 0-No pain/10 In general this is a well appearing African-American male in no acute distress.  He's alert and oriented x4 and appropriate throughout the examination. Cardiopulmonary assessment is negative for acute distress and he exhibits normal effort.   Lab Findings: Lab Results  Component Value Date   WBC 11.5 (H) 08/24/2017   HGB 9.2 (L) 08/24/2017   HCT 27.0 (L) 08/24/2017   MCV 92.4 08/24/2017   PLT 224 08/24/2017     Radiographic Findings: No results found.  Impression/Plan: 1. 58 y.o. gentleman with stage T2a adenocarcinoma of the prostate with a Gleason's score of 3+4 and a PSA of 9.26.   He will continue to follow up with urology for ongoing PSA determinations and has an appointment scheduled with Dr. Junious Silk on 11/04/2018. He understands what to expect with regards to PSA monitoring going forward. I will look forward to following his response to treatment via correspondence with urology, and would be happy  to continue to participate in his care if clinically indicated. I talked to the patient about what to expect in the future, including his risk for erectile dysfunction and rectal bleeding. I encouraged him to call or return to the office if he has any questions regarding his previous radiation or possible radiation side effects. He was comfortable with this plan and will follow up as needed.    Nicholos Johns, PA-C

## 2018-10-10 DIAGNOSIS — N186 End stage renal disease: Secondary | ICD-10-CM | POA: Diagnosis not present

## 2018-10-10 DIAGNOSIS — Z992 Dependence on renal dialysis: Secondary | ICD-10-CM | POA: Diagnosis not present

## 2018-10-10 DIAGNOSIS — E1129 Type 2 diabetes mellitus with other diabetic kidney complication: Secondary | ICD-10-CM | POA: Diagnosis not present

## 2018-10-10 DIAGNOSIS — D509 Iron deficiency anemia, unspecified: Secondary | ICD-10-CM | POA: Diagnosis not present

## 2018-10-10 DIAGNOSIS — N2581 Secondary hyperparathyroidism of renal origin: Secondary | ICD-10-CM | POA: Diagnosis not present

## 2018-10-10 DIAGNOSIS — D631 Anemia in chronic kidney disease: Secondary | ICD-10-CM | POA: Diagnosis not present

## 2018-10-13 DIAGNOSIS — E1129 Type 2 diabetes mellitus with other diabetic kidney complication: Secondary | ICD-10-CM | POA: Diagnosis not present

## 2018-10-13 DIAGNOSIS — N186 End stage renal disease: Secondary | ICD-10-CM | POA: Diagnosis not present

## 2018-10-13 DIAGNOSIS — D509 Iron deficiency anemia, unspecified: Secondary | ICD-10-CM | POA: Diagnosis not present

## 2018-10-13 DIAGNOSIS — D631 Anemia in chronic kidney disease: Secondary | ICD-10-CM | POA: Diagnosis not present

## 2018-10-13 DIAGNOSIS — Z992 Dependence on renal dialysis: Secondary | ICD-10-CM | POA: Diagnosis not present

## 2018-10-13 DIAGNOSIS — N2581 Secondary hyperparathyroidism of renal origin: Secondary | ICD-10-CM | POA: Diagnosis not present

## 2018-10-15 DIAGNOSIS — D509 Iron deficiency anemia, unspecified: Secondary | ICD-10-CM | POA: Diagnosis not present

## 2018-10-15 DIAGNOSIS — E1129 Type 2 diabetes mellitus with other diabetic kidney complication: Secondary | ICD-10-CM | POA: Diagnosis not present

## 2018-10-15 DIAGNOSIS — Z992 Dependence on renal dialysis: Secondary | ICD-10-CM | POA: Diagnosis not present

## 2018-10-15 DIAGNOSIS — N186 End stage renal disease: Secondary | ICD-10-CM | POA: Diagnosis not present

## 2018-10-15 DIAGNOSIS — N2581 Secondary hyperparathyroidism of renal origin: Secondary | ICD-10-CM | POA: Diagnosis not present

## 2018-10-15 DIAGNOSIS — D631 Anemia in chronic kidney disease: Secondary | ICD-10-CM | POA: Diagnosis not present

## 2018-10-17 DIAGNOSIS — D631 Anemia in chronic kidney disease: Secondary | ICD-10-CM | POA: Diagnosis not present

## 2018-10-17 DIAGNOSIS — N186 End stage renal disease: Secondary | ICD-10-CM | POA: Diagnosis not present

## 2018-10-17 DIAGNOSIS — E1129 Type 2 diabetes mellitus with other diabetic kidney complication: Secondary | ICD-10-CM | POA: Diagnosis not present

## 2018-10-17 DIAGNOSIS — N2581 Secondary hyperparathyroidism of renal origin: Secondary | ICD-10-CM | POA: Diagnosis not present

## 2018-10-17 DIAGNOSIS — Z992 Dependence on renal dialysis: Secondary | ICD-10-CM | POA: Diagnosis not present

## 2018-10-17 DIAGNOSIS — D509 Iron deficiency anemia, unspecified: Secondary | ICD-10-CM | POA: Diagnosis not present

## 2018-10-20 ENCOUNTER — Telehealth: Payer: Self-pay | Admitting: *Deleted

## 2018-10-20 DIAGNOSIS — D509 Iron deficiency anemia, unspecified: Secondary | ICD-10-CM | POA: Diagnosis not present

## 2018-10-20 DIAGNOSIS — N186 End stage renal disease: Secondary | ICD-10-CM | POA: Diagnosis not present

## 2018-10-20 DIAGNOSIS — Z992 Dependence on renal dialysis: Secondary | ICD-10-CM | POA: Diagnosis not present

## 2018-10-20 DIAGNOSIS — D631 Anemia in chronic kidney disease: Secondary | ICD-10-CM | POA: Diagnosis not present

## 2018-10-20 DIAGNOSIS — N2581 Secondary hyperparathyroidism of renal origin: Secondary | ICD-10-CM | POA: Diagnosis not present

## 2018-10-20 DIAGNOSIS — E1129 Type 2 diabetes mellitus with other diabetic kidney complication: Secondary | ICD-10-CM | POA: Diagnosis not present

## 2018-10-20 NOTE — Telephone Encounter (Signed)
On 10-20-18 fax medical records to [facility], it was consult note, end of tx note, sim and planning note, follow up note

## 2018-10-21 ENCOUNTER — Other Ambulatory Visit: Payer: Self-pay

## 2018-10-21 ENCOUNTER — Encounter (HOSPITAL_COMMUNITY): Payer: Self-pay

## 2018-10-21 ENCOUNTER — Emergency Department (HOSPITAL_COMMUNITY)
Admission: EM | Admit: 2018-10-21 | Discharge: 2018-10-21 | Disposition: A | Discharge status: Home / Self Care | Payer: Medicare Other | Attending: Emergency Medicine | Admitting: Emergency Medicine

## 2018-10-21 ENCOUNTER — Emergency Department (HOSPITAL_COMMUNITY): Payer: Medicare Other

## 2018-10-21 DIAGNOSIS — J189 Pneumonia, unspecified organism: Secondary | ICD-10-CM | POA: Insufficient documentation

## 2018-10-21 DIAGNOSIS — E1122 Type 2 diabetes mellitus with diabetic chronic kidney disease: Secondary | ICD-10-CM | POA: Diagnosis not present

## 2018-10-21 DIAGNOSIS — I498 Other specified cardiac arrhythmias: Secondary | ICD-10-CM | POA: Insufficient documentation

## 2018-10-21 DIAGNOSIS — I132 Hypertensive heart and chronic kidney disease with heart failure and with stage 5 chronic kidney disease, or end stage renal disease: Secondary | ICD-10-CM | POA: Diagnosis not present

## 2018-10-21 DIAGNOSIS — I5022 Chronic systolic (congestive) heart failure: Secondary | ICD-10-CM | POA: Diagnosis not present

## 2018-10-21 DIAGNOSIS — G629 Polyneuropathy, unspecified: Secondary | ICD-10-CM | POA: Diagnosis not present

## 2018-10-21 DIAGNOSIS — Z992 Dependence on renal dialysis: Secondary | ICD-10-CM | POA: Diagnosis not present

## 2018-10-21 DIAGNOSIS — Z7982 Long term (current) use of aspirin: Secondary | ICD-10-CM | POA: Diagnosis not present

## 2018-10-21 DIAGNOSIS — I252 Old myocardial infarction: Secondary | ICD-10-CM | POA: Diagnosis not present

## 2018-10-21 DIAGNOSIS — N186 End stage renal disease: Secondary | ICD-10-CM | POA: Insufficient documentation

## 2018-10-21 DIAGNOSIS — G6289 Other specified polyneuropathies: Secondary | ICD-10-CM

## 2018-10-21 DIAGNOSIS — I4891 Unspecified atrial fibrillation: Secondary | ICD-10-CM | POA: Diagnosis not present

## 2018-10-21 DIAGNOSIS — Z87891 Personal history of nicotine dependence: Secondary | ICD-10-CM | POA: Insufficient documentation

## 2018-10-21 DIAGNOSIS — Z79899 Other long term (current) drug therapy: Secondary | ICD-10-CM | POA: Insufficient documentation

## 2018-10-21 DIAGNOSIS — J181 Lobar pneumonia, unspecified organism: Secondary | ICD-10-CM

## 2018-10-21 DIAGNOSIS — Z8673 Personal history of transient ischemic attack (TIA), and cerebral infarction without residual deficits: Secondary | ICD-10-CM | POA: Diagnosis not present

## 2018-10-21 DIAGNOSIS — J986 Disorders of diaphragm: Secondary | ICD-10-CM | POA: Diagnosis not present

## 2018-10-21 LAB — COMPREHENSIVE METABOLIC PANEL
ALT: 14 U/L (ref 0–44)
ANION GAP: 18 — AB (ref 5–15)
AST: 18 U/L (ref 15–41)
Albumin: 3.6 g/dL (ref 3.5–5.0)
Alkaline Phosphatase: 59 U/L (ref 38–126)
BUN: 43 mg/dL — ABNORMAL HIGH (ref 6–20)
CO2: 26 mmol/L (ref 22–32)
Calcium: 9.8 mg/dL (ref 8.9–10.3)
Chloride: 91 mmol/L — ABNORMAL LOW (ref 98–111)
Creatinine, Ser: 9.33 mg/dL — ABNORMAL HIGH (ref 0.61–1.24)
GFR calc Af Amer: 6 mL/min — ABNORMAL LOW (ref >60)
GFR, EST NON AFRICAN AMERICAN: 6 mL/min — AB (ref >60)
Glucose, Bld: 91 mg/dL (ref 70–99)
POTASSIUM: 5.2 mmol/L — AB (ref 3.5–5.1)
Sodium: 135 mmol/L (ref 135–145)
Total Bilirubin: 0.5 mg/dL (ref 0.3–1.2)
Total Protein: 7.3 g/dL (ref 6.5–8.1)

## 2018-10-21 LAB — CBC WITH DIFFERENTIAL/PLATELET
ABS IMMATURE GRANULOCYTES: 0.09 10*3/uL — AB (ref 0.00–0.07)
BASOS ABS: 0.1 10*3/uL (ref 0.0–0.1)
BASOS PCT: 1 %
Eosinophils Absolute: 0.1 10*3/uL (ref 0.0–0.5)
Eosinophils Relative: 1 %
HCT: 32.4 % — ABNORMAL LOW (ref 39.0–52.0)
Hemoglobin: 10 g/dL — ABNORMAL LOW (ref 13.0–17.0)
Immature Granulocytes: 1 %
Lymphocytes Relative: 9 %
Lymphs Abs: 0.7 10*3/uL (ref 0.7–4.0)
MCH: 29.2 pg (ref 26.0–34.0)
MCHC: 30.9 g/dL (ref 30.0–36.0)
MCV: 94.5 fL (ref 80.0–100.0)
MONO ABS: 0.8 10*3/uL (ref 0.1–1.0)
Monocytes Relative: 10 %
NEUTROS ABS: 6.5 10*3/uL (ref 1.7–7.7)
NRBC: 0 % (ref 0.0–0.2)
Neutrophils Relative %: 78 %
Platelets: 185 10*3/uL (ref 150–400)
RBC: 3.43 MIL/uL — AB (ref 4.22–5.81)
RDW: 14.3 % (ref 11.5–15.5)
WBC: 8.3 10*3/uL (ref 4.0–10.5)

## 2018-10-21 LAB — LIPASE, BLOOD: LIPASE: 45 U/L (ref 11–51)

## 2018-10-21 MED ORDER — DOXYCYCLINE HYCLATE 100 MG PO CAPS: 100.0000 mg | ORAL_CAPSULE | Freq: Two times a day (BID) | ORAL | 0 refills | Status: AC

## 2018-10-21 MED ORDER — OXYCODONE-ACETAMINOPHEN 5-325 MG PO TABS
1.0000 | ORAL_TABLET | ORAL | Status: DC | PRN
  Administered 2018-10-21: 1 via ORAL
  Filled 2018-10-21: qty 1

## 2018-10-21 MED ORDER — PREGABALIN 100 MG PO CAPS: 100.0000 mg | ORAL_CAPSULE | Freq: Three times a day (TID) | ORAL | 0 refills | Status: DC

## 2018-10-21 MED ORDER — FENTANYL CITRATE (PF) 100 MCG/2ML IJ SOLN
50.0000 ug | Freq: Once | INTRAMUSCULAR | Status: AC
  Administered 2018-10-21: 50 ug via INTRAVENOUS
  Filled 2018-10-21: qty 2

## 2018-10-21 NOTE — ED Triage Notes (Signed)
Pt reports increased neuropathy pain in his feet and hands for the past few days, pt recently switched from lyrica to cymbalta last week due to insurance issues. Pt recieves dialysis MWF, last tx yesterday and has not missed any recent sessions. Pt a.o, ambulatory

## 2018-10-21 NOTE — ED Notes (Signed)
RN informed Pt can receive visitor  

## 2018-10-21 NOTE — Discharge Instructions (Addendum)
Take the antibiotics to help with your pneumonia.  Please make sure that you return to the ED if you start to develop a fever, worsening chest pain, shortness of breath, cough or generalized weakness. You can restart taking your Lyrica as previously prescribed in place of the Cymbalta. Please follow-up with your primary care provider for further refills of your medication and for repeat chest x-ray. Continue your dialysis as regularly scheduled.

## 2018-10-21 NOTE — ED Provider Notes (Signed)
[facility] EMERGENCY DEPARTMENT Provider Note   CSN: [id number] Arrival date & time: 10/21/18  1253    History   Chief Complaint Chief Complaint  Patient presents with  . Peripheral Neuropathy    HPI [name] is a 58 y.o. male with a past medical history of ESRD on dialysis MWF, prostate cancer, CHF who presents to ED for worsening peripheral neuropathy in bilateral feet and hands for the past week.  PCP switched patient from Lyrica to Cymbalta last week for neuropathy due to insurance issues.  States that he has had worsening pain since then.  He states that he felt like the Lyrica did actually help his neuropathy.  He describes a burning, tingling sensation in hands and feet.  He has not missed any dialysis sessions and last session was yesterday.  He does note a slight dry cough and intermittent nonbloody, nonbilious emesis for the past 2 weeks.  Denies any injuries or falls, chest pain, abdominal pain, diarrhea, headache or vision changes.     HPI  Past Medical History:  Diagnosis Date  . Arthritis   . Atrial fibrillation ([place])   . CHF (congestive heart failure), NYHA class III (HCC)    s/p heart transplant  . Chronic kidney disease    end stage  . Chronic systolic dysfunction of left ventricle   . CVA (cerebral infarction)   . Diabetes mellitus    PMH; Prior to heart transplant  . Family history of coronary artery disease    in both parents  . Hyperlipidemia   . Hypertension   . Left bundle branch block   . Morbid obesity ([place])    status post lap band  . Myocardial infarction [facility])    prior to heart transplant  . Nonischemic cardiomyopathy ([place])    prior to heart transplant  . Obesity (BMI 30-39.9)   . Obstructive sleep apnea    no longer needs CPAP after heart transplant per pt  . Premature ventricular contractions   . Prostate cancer ([place])   . SOB (shortness of breath)   . Stroke [facility])     Patient Active Problem List   Diagnosis  Date Noted  . Malignant neoplasm of prostate ([place]) 05/06/2018  . Closed posterior dislocation of hip, right, initial encounter ([place]) 08/24/2017  . End stage renal disease on dialysis ([place]) 08/24/2017  . Hip dislocation, right ([place]) 08/24/2017  . Osteoarthritis of right hip 07/22/2017  . VENTRICULAR TACHYCARDIA 12/21/2009  . HYPERLIPIDEMIA 08/30/2009  . HYPERTENSION, UNSPECIFIED 08/30/2009  . ISCHEMIC CARDIOMYOPATHY 08/30/2009  . ATRIAL FIBRILLATION 08/30/2009  . VENTRICULAR ECTOPY 08/30/2009  . CEREBROVASCULAR ACCIDENT, HX OF 08/30/2009  . MORBID OBESITY, HX OF 08/30/2009  . DIABETES MELLITUS, TYPE II 10/17/2007  . OBSTRUCTIVE SLEEP APNEA 10/17/2007  . CARDIOMYOPATHY, DILATED 10/17/2007    Past Surgical History:  Procedure Laterality Date  . CARDIAC PACEMAKER PLACEMENT  09/21/2009   Biventricular implantable cardioverter-defibrillator implantation     . COLONOSCOPY W/ BIOPSIES AND POLYPECTOMY    . FOOT SURGERY     left  . HEART TRANSPLANT  2014  . LAPAROSCOPIC GASTRIC BANDING  01/27/2007  . LEFT VENTRICULAR ASSIST DEVICE     implanted at [facility]  . PROSTATE BIOPSY    . TOTAL HIP ARTHROPLASTY Right 07/22/2017   Procedure: RIGHT TOTAL HIP ARTHROPLASTY ANTERIOR APPROACH;  Surgeon: Rod Can, MD;  Location: [facility];  Service: Orthopedics;  Laterality: Right;  Needs RNFA  . TOTAL KNEE ARTHROPLASTY Right 07/22/2017  Home Medications    Prior to Admission medications   Medication Sig Start Date End Date Taking? Authorizing Provider  aspirin 81 MG chewable tablet Chew 1 tablet (81 mg total) by mouth 2 (two) times daily. Patient taking differently: Chew 81 mg by mouth daily.  07/23/17  Yes Swinteck, Aaron Edelman, MD  B Complex-C-Folic Acid (RENO CAPS PO) Take 1 capsule by mouth daily at 12 noon.  01/08/18  Yes [provider]  B Complex-C-Folic Acid (RENO CAPS) 1 MG CAPS Take 1 capsule by mouth daily. 08/09/17  Yes [provider]  b complex-vitamin c-folic acid  (NEPHRO-VITE) 0.8 MG TABS tablet Take 1 tablet by mouth daily.  03/27/18  Yes [provider]  cinacalcet (SENSIPAR) 60 MG tablet Take 60 mg by mouth daily.    Yes [provider]  citalopram (CELEXA) 20 MG tablet Take 20 mg by mouth daily.  04/06/16  Yes [provider]  cycloSPORINE modified (NEORAL) 100 MG capsule Take 100 mg by mouth 2 (two) times daily. Take one capsule (100 mg) by mouth twice daily with 3 capsules (75 mg) for a total dose of 175 mg twice daily (both modified)    Yes [provider]  cycloSPORINE modified (NEORAL) 25 MG capsule Take 75 mg by mouth 2 (two) times daily. Take with 100 mg tablet to equal a total of 175 mg 08/05/18 08/05/19 Yes [provider]  docusate sodium (COLACE) 100 MG capsule Take 1 capsule (100 mg total) by mouth 2 (two) times daily. Patient taking differently: Take 100 mg by mouth 2 (two) times daily as needed (constipation).  07/23/17  Yes Swinteck, Aaron Edelman, MD  DULoxetine (CYMBALTA) 30 MG capsule Take 30 mg by mouth daily.  10/01/18  Yes [provider]  HYDROcodone-acetaminophen (NORCO/VICODIN) 5-325 MG tablet Take 1 tablet by mouth every 6 (six) hours as needed for moderate pain.    Yes [provider]  lidocaine (XYLOCAINE) 5 % ointment Apply 1 application topically as needed. Apply to feet twice daily as needed. 01/03/17  Yes Patel, Donika K, DO  lidocaine-prilocaine (EMLA) cream Apply 1 application topically as needed (port).  12/09/17  Yes [provider]  mycophenolate (MYFORTIC) 360 MG TBEC EC tablet Take 720 mg by mouth 2 (two) times daily.   Yes [provider]  omeprazole (PRILOSEC) 20 MG capsule Take 20 mg by mouth daily.  04/06/16  Yes [provider]  ondansetron (ZOFRAN) 4 MG tablet Take 1 tablet (4 mg total) by mouth every 6 (six) hours as needed for nausea. 07/23/17  Yes Swinteck, Aaron Edelman, MD  pravastatin (PRAVACHOL) 40 MG tablet Take 1 tablet (40 mg total) by  mouth daily at 6 PM. Patient taking differently: Take 20 mg by mouth at bedtime.  07/24/17  Yes Swinteck, Aaron Edelman, MD  senna (SENOKOT) 8.6 MG TABS tablet Take 2 tablets (17.2 mg total) by mouth at bedtime. Patient taking differently: Take 2 tablets by mouth daily as needed for mild constipation.  07/23/17  Yes Swinteck, Aaron Edelman, MD  sevelamer carbonate (RENVELA) 800 MG tablet Take 800-2,400 mg by mouth See admin instructions. Take (1600-2400 mg) by mouth three times daily with meals, take  (800 mg) with snacks   Yes [provider]  tadalafil (CIALIS) 20 MG tablet Take 20 mg by mouth daily as needed for erectile dysfunction.   Yes [provider]  VELPHORO 500 MG chewable tablet Chew 1,000 mg by mouth 3 (three) times daily with meals.  12/10/17  Yes [provider]  cephALEXin (KEFLEX) 500 MG capsule Take 1 capsule (500 mg total) by mouth 3 (three) times daily. Patient not taking: Reported on 10/21/2018 12/23/17   Wallene Huh, DPM  Cyanocobalamin (NASCOBAL) 500 MCG/0.1ML SOLN Place 500 mcg into the nose daily as needed (energy boost).    [provider]  doxycycline (VIBRAMYCIN) 100 MG capsule Take 1 capsule (100 mg total) by mouth 2 (two) times daily for 7 days. 10/21/18 10/28/18  Yarielis Funaro, PA-C  oxyCODONE (OXY IR/ROXICODONE) 5 MG immediate release tablet Take 1-2 tablets (5-10 mg total) by mouth every 6 (six) hours as needed for breakthrough pain. Patient not taking: Reported on 10/21/2018 07/23/17   Rod Can, MD  pregabalin (LYRICA) 100 MG capsule Take 1 capsule (100 mg total) by mouth 3 (three) times daily. 10/21/18   Young Mulvey, PA-C  venlafaxine (EFFEXOR) 37.5 MG tablet Take half tablet x 1 month, then increase to 1 tablet daily.  On days of dialysis, take afterwards. Patient not taking: Reported on 10/21/2018 07/01/17   Alda Berthold, DO    Family History Family History  Problem Relation Age of Onset  . Coronary artery disease Father        had,  PTCA & CABG  . Heart attack Father   . Hypertension Father   . Diabetes Father   . Prostate cancer Father   . Coronary artery disease Mother   . Heart attack Mother   . Hypertension Mother   . Stroke Mother   . Lupus Mother   . Prostate cancer Brother   . Prostate cancer Paternal Uncle     Social History Social History   Tobacco Use  . Smoking status: Never Smoker  . Smokeless tobacco: Former Systems developer    Types: Snuff  Substance Use Topics  . Alcohol use: No  . Drug use: No     Allergies   Heparin and Penicillin g potassium [penicillin g]   Review of Systems Review of Systems  Constitutional: Negative for appetite change, chills and fever.  HENT: Negative for ear pain, rhinorrhea, sneezing and sore throat.   Eyes: Negative for photophobia and visual disturbance.  Respiratory: Negative for cough, chest tightness, shortness of breath and wheezing.   Cardiovascular: Negative for chest pain and palpitations.  Gastrointestinal: Negative for abdominal pain, blood in stool, constipation, diarrhea, nausea and vomiting.  Genitourinary: Negative for dysuria, hematuria and urgency.  Musculoskeletal: Negative for myalgias.  Skin: Negative for rash.  Neurological: Negative for dizziness, weakness and light-headedness.     Physical Exam Updated Vital Signs BP 124/80   Pulse 77   Temp 97.7 F (36.5 C) (Oral)   Resp 16   Ht 6\' 7"  (2.007 m)   Wt (!) 147 kg   SpO2 100%   BMI 36.50 kg/m   Physical Exam Vitals signs and nursing note reviewed.  Constitutional:      General: He is not in acute distress.    Appearance: He is well-developed. He is obese.  HENT:     Head: Normocephalic and atraumatic.     Nose: Nose normal.  Eyes:     General: No scleral icterus.       Right eye: No discharge.        Left eye: No discharge.     Conjunctiva/sclera: Conjunctivae normal.     Pupils: Pupils are equal, round, and reactive to light.  Neck:     Musculoskeletal: Normal range of  motion and neck supple.  Cardiovascular:  Rate and Rhythm: Normal rate and regular rhythm.     Heart sounds: Normal heart sounds. No murmur. No friction rub. No gallop.   Pulmonary:     Effort: Pulmonary effort is normal. No respiratory distress.     Breath sounds: Normal breath sounds.  Abdominal:     General: Bowel sounds are normal. There is no distension.     Palpations: Abdomen is soft.     Tenderness: There is no abdominal tenderness. There is no guarding.  Musculoskeletal: Normal range of motion.     Comments: 2+ DP pulses bilaterally.  Skin:    General: Skin is warm and dry.     Findings: No rash.  Neurological:     General: No focal deficit present.     Mental Status: He is alert.     Cranial Nerves: No cranial nerve deficit.     Sensory: Sensory deficit (chronic reduced sensation 2/2 neuropathy in BLE, BUE) present.     Motor: No abnormal muscle tone.     Coordination: Coordination normal.     Comments: Equal grip strength bilaterally.      ED Treatments / Results  Labs (all labs ordered are listed, but only abnormal results are displayed) Labs Reviewed  CBC WITH DIFFERENTIAL/PLATELET - Abnormal; Notable for the following components:      Result Value   RBC 3.43 (*)    Hemoglobin 10.0 (*)    HCT 32.4 (*)    Abs Immature Granulocytes 0.09 (*)    All other components within normal limits  COMPREHENSIVE METABOLIC PANEL - Abnormal; Notable for the following components:   Potassium 5.2 (*)    Chloride 91 (*)    BUN 43 (*)    Creatinine, Ser 9.33 (*)    GFR calc non Af Amer 6 (*)    GFR calc Af Amer 6 (*)    Anion gap 18 (*)    All other components within normal limits  LIPASE, BLOOD    EKG None  Radiology Dg Chest 2 View  Result Date: 10/21/2018 CLINICAL DATA:  58 year old heart transplant patient presenting with worsening peripheral neuropathy in his hands and feet over the past several days. End stage renal disease on hemodialysis. EXAM: CHEST - 2  VIEW COMPARISON:  07/26/2016 and earlier. FINDINGS: Sternotomy. Cardiac silhouette moderately to markedly enlarged, unchanged. Consolidation and streaky opacities in the LEFT LOWER LOBE. Pulmonary venous hypertension without overt edema. Lungs otherwise clear. No visible pleural effusions. Stable mild chronic elevation of the LEFT hemidiaphragm. Degenerative changes involving the thoracic spine. IMPRESSION: 1. Acute pneumonia is suspected involving the LEFT LOWER LOBE. 2. Stable moderate to marked cardiomegaly. Pulmonary venous hypertension without overt edema. Electronically Signed   By: Evangeline Dakin M.D.   On: 10/21/2018 16:27    Procedures Procedures (including critical care time)  Medications Ordered in ED Medications  oxyCODONE-acetaminophen (PERCOCET/ROXICET) 5-325 MG per tablet 1 tablet (1 tablet Oral Given 10/21/18 1327)  fentaNYL (SUBLIMAZE) injection 50 mcg (50 mcg Intravenous Given 10/21/18 1534)     Initial Impression / Assessment and Plan / ED Course  I have reviewed the triage vital signs and the nursing notes.  Pertinent labs & imaging results that were available during my care of the patient were reviewed by me and considered in my medical decision making (see chart for details).        58 year old male with a past medical history of ESRD on dialysis, heart transplant, prostate cancer, CHF presents to ED for worsening neuropathy  in bilateral upper and lower extremities since being switched from Lyrica to Cymbalta in the past week.  He reports a burning, tingling sensation.  He also complains of intermittent cough and chills for the past month.  Denies any injuries or falls, chest pain, abdominal pain.  On my exam he does not appear fluid overloaded.  He has decreased sensation to bilateral feet and hands that is consistent with his neuropathy.  States that the pain is keeping him up at night.  Vital signs are within normal limits.  Lab work significant for K 5.2, AG 18 which  is c/w ongoing dialysis. CXR shows LLL pneumonia.  Patient is not tachycardic, tachypneic or hypoxic.  He is comfortable with fentanyl given here in the ED.  Will switch him back to Lyrica previous dose and give Doxy for pneumonia.  He is requesting discharge home. I have asked him to follow-up with his PCP and given strict return precautions for return to the ED.  Patient is hemodynamically stable, in NAD, and able to ambulate in the ED. Evaluation does not show pathology that would require ongoing emergent intervention or inpatient treatment. I explained the diagnosis to the patient. Pain has been managed and has no complaints prior to discharge. Patient is comfortable with above plan and is stable for discharge at this time. All questions were answered prior to disposition. Strict return precautions for returning to the ED were discussed. Encouraged follow up with PCP.    Portions of this note were generated with Lobbyist. Dictation errors may occur despite best attempts at proofreading.  Final Clinical Impressions(s) / ED Diagnoses   Final diagnoses:  Other polyneuropathy  Community acquired pneumonia of left lower lobe of lung [address] [facility])    ED Discharge Orders         Ordered    doxycycline (VIBRAMYCIN) 100 MG capsule  2 times daily     10/21/18 1738    pregabalin (LYRICA) 100 MG capsule  3 times daily     10/21/18 1738           Delia Heady, PA-C 10/21/18 1742    Blanchie Dessert, MD 10/22/18 2056

## 2018-10-22 DIAGNOSIS — Z992 Dependence on renal dialysis: Secondary | ICD-10-CM | POA: Diagnosis not present

## 2018-10-22 DIAGNOSIS — N186 End stage renal disease: Secondary | ICD-10-CM | POA: Diagnosis not present

## 2018-10-22 DIAGNOSIS — D631 Anemia in chronic kidney disease: Secondary | ICD-10-CM | POA: Diagnosis not present

## 2018-10-22 DIAGNOSIS — E1129 Type 2 diabetes mellitus with other diabetic kidney complication: Secondary | ICD-10-CM | POA: Diagnosis not present

## 2018-10-22 DIAGNOSIS — N2581 Secondary hyperparathyroidism of renal origin: Secondary | ICD-10-CM | POA: Diagnosis not present

## 2018-10-22 DIAGNOSIS — D509 Iron deficiency anemia, unspecified: Secondary | ICD-10-CM | POA: Diagnosis not present

## 2018-10-24 DIAGNOSIS — N186 End stage renal disease: Secondary | ICD-10-CM | POA: Diagnosis not present

## 2018-10-24 DIAGNOSIS — N2581 Secondary hyperparathyroidism of renal origin: Secondary | ICD-10-CM | POA: Diagnosis not present

## 2018-10-26 DIAGNOSIS — T8620 Unspecified complication of heart transplant: Secondary | ICD-10-CM | POA: Diagnosis not present

## 2018-10-26 DIAGNOSIS — Z992 Dependence on renal dialysis: Secondary | ICD-10-CM | POA: Diagnosis not present

## 2018-10-26 DIAGNOSIS — N186 End stage renal disease: Secondary | ICD-10-CM | POA: Diagnosis not present

## 2018-10-27 DIAGNOSIS — D631 Anemia in chronic kidney disease: Secondary | ICD-10-CM | POA: Diagnosis not present

## 2018-10-27 DIAGNOSIS — E1129 Type 2 diabetes mellitus with other diabetic kidney complication: Secondary | ICD-10-CM | POA: Diagnosis not present

## 2018-10-27 DIAGNOSIS — D509 Iron deficiency anemia, unspecified: Secondary | ICD-10-CM | POA: Diagnosis not present

## 2018-10-27 DIAGNOSIS — N186 End stage renal disease: Secondary | ICD-10-CM | POA: Diagnosis not present

## 2018-10-27 DIAGNOSIS — Z992 Dependence on renal dialysis: Secondary | ICD-10-CM | POA: Diagnosis not present

## 2018-10-27 DIAGNOSIS — N2581 Secondary hyperparathyroidism of renal origin: Secondary | ICD-10-CM | POA: Diagnosis not present

## 2018-10-29 DIAGNOSIS — Z992 Dependence on renal dialysis: Secondary | ICD-10-CM | POA: Diagnosis not present

## 2018-10-29 DIAGNOSIS — E1129 Type 2 diabetes mellitus with other diabetic kidney complication: Secondary | ICD-10-CM | POA: Diagnosis not present

## 2018-10-29 DIAGNOSIS — D509 Iron deficiency anemia, unspecified: Secondary | ICD-10-CM | POA: Diagnosis not present

## 2018-10-29 DIAGNOSIS — D631 Anemia in chronic kidney disease: Secondary | ICD-10-CM | POA: Diagnosis not present

## 2018-10-29 DIAGNOSIS — N186 End stage renal disease: Secondary | ICD-10-CM | POA: Diagnosis not present

## 2018-10-29 DIAGNOSIS — N2581 Secondary hyperparathyroidism of renal origin: Secondary | ICD-10-CM | POA: Diagnosis not present

## 2018-10-30 DIAGNOSIS — C61 Malignant neoplasm of prostate: Secondary | ICD-10-CM | POA: Diagnosis not present

## 2018-10-31 DIAGNOSIS — E1129 Type 2 diabetes mellitus with other diabetic kidney complication: Secondary | ICD-10-CM | POA: Diagnosis not present

## 2018-10-31 DIAGNOSIS — D509 Iron deficiency anemia, unspecified: Secondary | ICD-10-CM | POA: Diagnosis not present

## 2018-10-31 DIAGNOSIS — N2581 Secondary hyperparathyroidism of renal origin: Secondary | ICD-10-CM | POA: Diagnosis not present

## 2018-10-31 DIAGNOSIS — N186 End stage renal disease: Secondary | ICD-10-CM | POA: Diagnosis not present

## 2018-10-31 DIAGNOSIS — D631 Anemia in chronic kidney disease: Secondary | ICD-10-CM | POA: Diagnosis not present

## 2018-10-31 DIAGNOSIS — Z992 Dependence on renal dialysis: Secondary | ICD-10-CM | POA: Diagnosis not present

## 2018-11-03 DIAGNOSIS — N186 End stage renal disease: Secondary | ICD-10-CM | POA: Diagnosis not present

## 2018-11-03 DIAGNOSIS — D509 Iron deficiency anemia, unspecified: Secondary | ICD-10-CM | POA: Diagnosis not present

## 2018-11-03 DIAGNOSIS — Z992 Dependence on renal dialysis: Secondary | ICD-10-CM | POA: Diagnosis not present

## 2018-11-03 DIAGNOSIS — E1129 Type 2 diabetes mellitus with other diabetic kidney complication: Secondary | ICD-10-CM | POA: Diagnosis not present

## 2018-11-03 DIAGNOSIS — D631 Anemia in chronic kidney disease: Secondary | ICD-10-CM | POA: Diagnosis not present

## 2018-11-03 DIAGNOSIS — N2581 Secondary hyperparathyroidism of renal origin: Secondary | ICD-10-CM | POA: Diagnosis not present

## 2018-11-04 DIAGNOSIS — C61 Malignant neoplasm of prostate: Secondary | ICD-10-CM | POA: Diagnosis not present

## 2018-11-05 DIAGNOSIS — N186 End stage renal disease: Secondary | ICD-10-CM | POA: Diagnosis not present

## 2018-11-05 DIAGNOSIS — Z992 Dependence on renal dialysis: Secondary | ICD-10-CM | POA: Diagnosis not present

## 2018-11-05 DIAGNOSIS — D631 Anemia in chronic kidney disease: Secondary | ICD-10-CM | POA: Diagnosis not present

## 2018-11-05 DIAGNOSIS — E1129 Type 2 diabetes mellitus with other diabetic kidney complication: Secondary | ICD-10-CM | POA: Diagnosis not present

## 2018-11-05 DIAGNOSIS — N2581 Secondary hyperparathyroidism of renal origin: Secondary | ICD-10-CM | POA: Diagnosis not present

## 2018-11-05 DIAGNOSIS — D509 Iron deficiency anemia, unspecified: Secondary | ICD-10-CM | POA: Diagnosis not present

## 2018-11-07 DIAGNOSIS — D631 Anemia in chronic kidney disease: Secondary | ICD-10-CM | POA: Diagnosis not present

## 2018-11-07 DIAGNOSIS — N186 End stage renal disease: Secondary | ICD-10-CM | POA: Diagnosis not present

## 2018-11-07 DIAGNOSIS — E1129 Type 2 diabetes mellitus with other diabetic kidney complication: Secondary | ICD-10-CM | POA: Diagnosis not present

## 2018-11-07 DIAGNOSIS — D509 Iron deficiency anemia, unspecified: Secondary | ICD-10-CM | POA: Diagnosis not present

## 2018-11-07 DIAGNOSIS — N2581 Secondary hyperparathyroidism of renal origin: Secondary | ICD-10-CM | POA: Diagnosis not present

## 2018-11-07 DIAGNOSIS — Z992 Dependence on renal dialysis: Secondary | ICD-10-CM | POA: Diagnosis not present

## 2018-11-09 ENCOUNTER — Encounter (HOSPITAL_COMMUNITY): Payer: Self-pay | Admitting: Emergency Medicine

## 2018-11-09 ENCOUNTER — Inpatient Hospital Stay (HOSPITAL_COMMUNITY): Payer: Medicare Other

## 2018-11-09 ENCOUNTER — Inpatient Hospital Stay (HOSPITAL_COMMUNITY)
Admission: EM | Admit: 2018-11-09 | Discharge: 2018-11-11 | DRG: 091 | Disposition: A | Discharge status: Home Health | Payer: Medicare Other | Attending: Internal Medicine | Admitting: Internal Medicine

## 2018-11-09 ENCOUNTER — Emergency Department (HOSPITAL_COMMUNITY): Payer: Medicare Other

## 2018-11-09 ENCOUNTER — Other Ambulatory Visit: Payer: Self-pay

## 2018-11-09 DIAGNOSIS — I120 Hypertensive chronic kidney disease with stage 5 chronic kidney disease or end stage renal disease: Secondary | ICD-10-CM | POA: Diagnosis not present

## 2018-11-09 DIAGNOSIS — E1142 Type 2 diabetes mellitus with diabetic polyneuropathy: Secondary | ICD-10-CM | POA: Diagnosis present

## 2018-11-09 DIAGNOSIS — I340 Nonrheumatic mitral (valve) insufficiency: Secondary | ICD-10-CM | POA: Diagnosis not present

## 2018-11-09 DIAGNOSIS — R5381 Other malaise: Secondary | ICD-10-CM | POA: Diagnosis present

## 2018-11-09 DIAGNOSIS — G4733 Obstructive sleep apnea (adult) (pediatric): Secondary | ICD-10-CM | POA: Diagnosis present

## 2018-11-09 DIAGNOSIS — E785 Hyperlipidemia, unspecified: Secondary | ICD-10-CM | POA: Diagnosis present

## 2018-11-09 DIAGNOSIS — G92 Toxic encephalopathy: Principal | ICD-10-CM | POA: Diagnosis present

## 2018-11-09 DIAGNOSIS — Z833 Family history of diabetes mellitus: Secondary | ICD-10-CM

## 2018-11-09 DIAGNOSIS — N186 End stage renal disease: Secondary | ICD-10-CM | POA: Diagnosis present

## 2018-11-09 DIAGNOSIS — R29701 NIHSS score 1: Secondary | ICD-10-CM | POA: Diagnosis present

## 2018-11-09 DIAGNOSIS — D631 Anemia in chronic kidney disease: Secondary | ICD-10-CM | POA: Diagnosis present

## 2018-11-09 DIAGNOSIS — E1122 Type 2 diabetes mellitus with diabetic chronic kidney disease: Secondary | ICD-10-CM | POA: Diagnosis present

## 2018-11-09 DIAGNOSIS — Z992 Dependence on renal dialysis: Secondary | ICD-10-CM | POA: Diagnosis not present

## 2018-11-09 DIAGNOSIS — I639 Cerebral infarction, unspecified: Secondary | ICD-10-CM | POA: Diagnosis not present

## 2018-11-09 DIAGNOSIS — Z941 Heart transplant status: Secondary | ICD-10-CM | POA: Diagnosis not present

## 2018-11-09 DIAGNOSIS — R471 Dysarthria and anarthria: Secondary | ICD-10-CM | POA: Diagnosis present

## 2018-11-09 DIAGNOSIS — R410 Disorientation, unspecified: Secondary | ICD-10-CM | POA: Diagnosis not present

## 2018-11-09 DIAGNOSIS — G4700 Insomnia, unspecified: Secondary | ICD-10-CM | POA: Diagnosis present

## 2018-11-09 DIAGNOSIS — R112 Nausea with vomiting, unspecified: Secondary | ICD-10-CM | POA: Diagnosis present

## 2018-11-09 DIAGNOSIS — Z95810 Presence of automatic (implantable) cardiac defibrillator: Secondary | ICD-10-CM

## 2018-11-09 DIAGNOSIS — Z9884 Bariatric surgery status: Secondary | ICD-10-CM

## 2018-11-09 DIAGNOSIS — Z880 Allergy status to penicillin: Secondary | ICD-10-CM

## 2018-11-09 DIAGNOSIS — I10 Essential (primary) hypertension: Secondary | ICD-10-CM | POA: Diagnosis not present

## 2018-11-09 DIAGNOSIS — R9082 White matter disease, unspecified: Secondary | ICD-10-CM | POA: Diagnosis not present

## 2018-11-09 DIAGNOSIS — E1140 Type 2 diabetes mellitus with diabetic neuropathy, unspecified: Secondary | ICD-10-CM | POA: Diagnosis present

## 2018-11-09 DIAGNOSIS — E8889 Other specified metabolic disorders: Secondary | ICD-10-CM | POA: Diagnosis present

## 2018-11-09 DIAGNOSIS — Z7982 Long term (current) use of aspirin: Secondary | ICD-10-CM

## 2018-11-09 DIAGNOSIS — Z8673 Personal history of transient ischemic attack (TIA), and cerebral infarction without residual deficits: Secondary | ICD-10-CM

## 2018-11-09 DIAGNOSIS — Z6836 Body mass index (BMI) 36.0-36.9, adult: Secondary | ICD-10-CM | POA: Diagnosis not present

## 2018-11-09 DIAGNOSIS — C61 Malignant neoplasm of prostate: Secondary | ICD-10-CM | POA: Diagnosis present

## 2018-11-09 DIAGNOSIS — I132 Hypertensive heart and chronic kidney disease with heart failure and with stage 5 chronic kidney disease, or end stage renal disease: Secondary | ICD-10-CM | POA: Diagnosis present

## 2018-11-09 DIAGNOSIS — I517 Cardiomegaly: Secondary | ICD-10-CM | POA: Diagnosis not present

## 2018-11-09 DIAGNOSIS — N2581 Secondary hyperparathyroidism of renal origin: Secondary | ICD-10-CM | POA: Diagnosis present

## 2018-11-09 DIAGNOSIS — Z96641 Presence of right artificial hip joint: Secondary | ICD-10-CM | POA: Diagnosis present

## 2018-11-09 DIAGNOSIS — E1169 Type 2 diabetes mellitus with other specified complication: Secondary | ICD-10-CM | POA: Diagnosis not present

## 2018-11-09 DIAGNOSIS — E669 Obesity, unspecified: Secondary | ICD-10-CM | POA: Diagnosis not present

## 2018-11-09 DIAGNOSIS — Z96651 Presence of right artificial knee joint: Secondary | ICD-10-CM | POA: Diagnosis present

## 2018-11-09 DIAGNOSIS — E66813 Obesity, class 3: Secondary | ICD-10-CM | POA: Diagnosis present

## 2018-11-09 DIAGNOSIS — Z888 Allergy status to other drugs, medicaments and biological substances status: Secondary | ICD-10-CM

## 2018-11-09 DIAGNOSIS — I6300 Cerebral infarction due to thrombosis of unspecified precerebral artery: Secondary | ICD-10-CM

## 2018-11-09 DIAGNOSIS — Z823 Family history of stroke: Secondary | ICD-10-CM

## 2018-11-09 DIAGNOSIS — I361 Nonrheumatic tricuspid (valve) insufficiency: Secondary | ICD-10-CM | POA: Diagnosis not present

## 2018-11-09 DIAGNOSIS — Z8249 Family history of ischemic heart disease and other diseases of the circulatory system: Secondary | ICD-10-CM

## 2018-11-09 DIAGNOSIS — Z8042 Family history of malignant neoplasm of prostate: Secondary | ICD-10-CM

## 2018-11-09 DIAGNOSIS — Z79899 Other long term (current) drug therapy: Secondary | ICD-10-CM

## 2018-11-09 DIAGNOSIS — R4781 Slurred speech: Secondary | ICD-10-CM | POA: Diagnosis present

## 2018-11-09 LAB — CBC WITH DIFFERENTIAL/PLATELET
Abs Immature Granulocytes: 0.08 10*3/uL — ABNORMAL HIGH (ref 0.00–0.07)
BASOS PCT: 1 %
Basophils Absolute: 0.1 10*3/uL (ref 0.0–0.1)
EOS PCT: 1 %
Eosinophils Absolute: 0 10*3/uL (ref 0.0–0.5)
HCT: 30.4 % — ABNORMAL LOW (ref 39.0–52.0)
Hemoglobin: 9.9 g/dL — ABNORMAL LOW (ref 13.0–17.0)
Immature Granulocytes: 1 %
Lymphocytes Relative: 8 %
Lymphs Abs: 0.6 10*3/uL — ABNORMAL LOW (ref 0.7–4.0)
MCH: 30.1 pg (ref 26.0–34.0)
MCHC: 32.6 g/dL (ref 30.0–36.0)
MCV: 92.4 fL (ref 80.0–100.0)
MONO ABS: 0.5 10*3/uL (ref 0.1–1.0)
MONOS PCT: 7 %
Neutro Abs: 5.9 10*3/uL (ref 1.7–7.7)
Neutrophils Relative %: 82 %
PLATELETS: 205 10*3/uL (ref 150–400)
RBC: 3.29 MIL/uL — AB (ref 4.22–5.81)
RDW: 14.3 % (ref 11.5–15.5)
WBC: 7.1 10*3/uL (ref 4.0–10.5)
nRBC: 0 % (ref 0.0–0.2)

## 2018-11-09 LAB — LACTIC ACID, PLASMA: LACTIC ACID, VENOUS: 0.9 mmol/L (ref 0.5–1.9)

## 2018-11-09 LAB — I-STAT TROPONIN, ED: TROPONIN I, POC: 0.01 ng/mL (ref 0.00–0.08)

## 2018-11-09 LAB — COMPREHENSIVE METABOLIC PANEL
ALK PHOS: 67 U/L (ref 38–126)
ALT: 15 U/L (ref 0–44)
ANION GAP: 17 — AB (ref 5–15)
AST: 23 U/L (ref 15–41)
Albumin: 3.4 g/dL — ABNORMAL LOW (ref 3.5–5.0)
BUN: 33 mg/dL — ABNORMAL HIGH (ref 6–20)
CALCIUM: 9.7 mg/dL (ref 8.9–10.3)
CHLORIDE: 94 mmol/L — AB (ref 98–111)
CO2: 27 mmol/L (ref 22–32)
CREATININE: 9.97 mg/dL — AB (ref 0.61–1.24)
GFR, EST AFRICAN AMERICAN: 6 mL/min — AB (ref >60)
GFR, EST NON AFRICAN AMERICAN: 5 mL/min — AB (ref >60)
Glucose, Bld: 100 mg/dL — ABNORMAL HIGH (ref 70–99)
Potassium: 4.8 mmol/L (ref 3.5–5.1)
SODIUM: 138 mmol/L (ref 135–145)
Total Bilirubin: 0.9 mg/dL (ref 0.3–1.2)
Total Protein: 6.6 g/dL (ref 6.5–8.1)

## 2018-11-09 LAB — SALICYLATE LEVEL: Salicylate Lvl: <7 mg/dL (ref 2.8–30.0)

## 2018-11-09 LAB — ECHOCARDIOGRAM COMPLETE

## 2018-11-09 LAB — MRSA PCR SCREENING: MRSA by PCR: NEGATIVE

## 2018-11-09 LAB — ACETAMINOPHEN LEVEL

## 2018-11-09 LAB — AMMONIA: Ammonia: <9 umol/L — ABNORMAL LOW (ref 9–35)

## 2018-11-09 LAB — TSH: TSH: 1.03 u[IU]/mL (ref 0.350–4.500)

## 2018-11-09 MED ORDER — HYDROCODONE-ACETAMINOPHEN 5-325 MG PO TABS
1.0000 | ORAL_TABLET | Freq: Four times a day (QID) | ORAL | Status: DC | PRN
  Administered 2018-11-09 – 2018-11-10 (×2): 1 via ORAL
  Filled 2018-11-09 (×2): qty 1

## 2018-11-09 MED ORDER — CINACALCET HCL 30 MG PO TABS
60.0000 mg | ORAL_TABLET | Freq: Every day | ORAL | Status: DC
  Administered 2018-11-10 – 2018-11-11 (×2): 60 mg via ORAL
  Filled 2018-11-09 (×2): qty 2

## 2018-11-09 MED ORDER — OXYCODONE HCL 5 MG PO TABS
5.0000 mg | ORAL_TABLET | Freq: Four times a day (QID) | ORAL | Status: DC | PRN
  Administered 2018-11-11: 5 mg via ORAL
  Filled 2018-11-09: qty 1

## 2018-11-09 MED ORDER — CYCLOSPORINE MODIFIED (NEORAL) 25 MG PO CAPS
100.0000 mg | ORAL_CAPSULE | Freq: Two times a day (BID) | ORAL | Status: DC
  Administered 2018-11-09 – 2018-11-11 (×5): 100 mg via ORAL
  Filled 2018-11-09 (×6): qty 4

## 2018-11-09 MED ORDER — ATORVASTATIN CALCIUM 40 MG PO TABS: 40.0000 mg | ORAL_TABLET | Freq: Every day | ORAL | Status: DC

## 2018-11-09 MED ORDER — CYCLOSPORINE MODIFIED (NEORAL) 25 MG PO CAPS
75.0000 mg | ORAL_CAPSULE | Freq: Two times a day (BID) | ORAL | Status: DC
  Administered 2018-11-09 – 2018-11-11 (×5): 75 mg via ORAL
  Filled 2018-11-09 (×5): qty 3

## 2018-11-09 MED ORDER — ZOLPIDEM TARTRATE 5 MG PO TABS
5.0000 mg | ORAL_TABLET | Freq: Every evening | ORAL | Status: DC | PRN
  Administered 2018-11-10 (×2): 5 mg via ORAL
  Filled 2018-11-09 (×2): qty 1

## 2018-11-09 MED ORDER — SENNA 8.6 MG PO TABS: 2.0000 | ORAL_TABLET | Freq: Every day | ORAL | Status: DC | PRN

## 2018-11-09 MED ORDER — LORAZEPAM 2 MG/ML IJ SOLN
0.5000 mg | Freq: Once | INTRAMUSCULAR | Status: AC
  Administered 2018-11-09: 0.5 mg via INTRAVENOUS
  Filled 2018-11-09: qty 1

## 2018-11-09 MED ORDER — DULOXETINE HCL 30 MG PO CPEP
30.0000 mg | ORAL_CAPSULE | Freq: Every day | ORAL | Status: DC
  Administered 2018-11-09: 30 mg via ORAL
  Filled 2018-11-09: qty 1

## 2018-11-09 MED ORDER — CAMPHOR-MENTHOL 0.5-0.5 % EX LOTN
1.0000 "application " | TOPICAL_LOTION | Freq: Three times a day (TID) | CUTANEOUS | Status: DC | PRN
  Filled 2018-11-09: qty 222

## 2018-11-09 MED ORDER — CALCIUM CARBONATE ANTACID 1250 MG/5ML PO SUSP
500.0000 mg | Freq: Four times a day (QID) | ORAL | Status: DC | PRN
  Filled 2018-11-09: qty 5

## 2018-11-09 MED ORDER — NEPRO/CARBSTEADY PO LIQD
237.0000 mL | Freq: Three times a day (TID) | ORAL | Status: DC | PRN
  Filled 2018-11-09: qty 237

## 2018-11-09 MED ORDER — STROKE: EARLY STAGES OF RECOVERY BOOK
Freq: Once | Status: AC
  Administered 2018-11-09: 15:00:00

## 2018-11-09 MED ORDER — ASPIRIN 81 MG PO CHEW
81.0000 mg | CHEWABLE_TABLET | Freq: Every day | ORAL | Status: DC
  Administered 2018-11-09 – 2018-11-11 (×3): 81 mg via ORAL
  Filled 2018-11-09 (×3): qty 1

## 2018-11-09 MED ORDER — ACETAMINOPHEN 325 MG PO TABS: 650.0000 mg | ORAL_TABLET | Freq: Four times a day (QID) | ORAL | Status: DC | PRN

## 2018-11-09 MED ORDER — PANTOPRAZOLE SODIUM 40 MG PO TBEC
40.0000 mg | DELAYED_RELEASE_TABLET | Freq: Every day | ORAL | Status: DC
  Administered 2018-11-09 – 2018-11-11 (×3): 40 mg via ORAL
  Filled 2018-11-09: qty 2
  Filled 2018-11-09 (×3): qty 1

## 2018-11-09 MED ORDER — HYDROXYZINE HCL 25 MG PO TABS: 25.0000 mg | ORAL_TABLET | Freq: Three times a day (TID) | ORAL | Status: DC | PRN

## 2018-11-09 MED ORDER — ONDANSETRON HCL 4 MG PO TABS: 4.0000 mg | ORAL_TABLET | Freq: Four times a day (QID) | ORAL | Status: DC | PRN

## 2018-11-09 MED ORDER — PRAVASTATIN SODIUM 10 MG PO TABS
20.0000 mg | ORAL_TABLET | Freq: Every day | ORAL | Status: DC
  Administered 2018-11-09: 20 mg via ORAL
  Filled 2018-11-09 (×2): qty 2

## 2018-11-09 MED ORDER — ACETAMINOPHEN 650 MG RE SUPP: 650.0000 mg | Freq: Four times a day (QID) | RECTAL | Status: DC | PRN

## 2018-11-09 MED ORDER — MYCOPHENOLATE SODIUM 180 MG PO TBEC
720.0000 mg | DELAYED_RELEASE_TABLET | Freq: Two times a day (BID) | ORAL | Status: DC
  Administered 2018-11-09 – 2018-11-11 (×5): 720 mg via ORAL
  Filled 2018-11-09 (×5): qty 4

## 2018-11-09 MED ORDER — CYCLOSPORINE MODIFIED (NEORAL) 100 MG PO CAPS: 175.0000 mg | ORAL_CAPSULE | Freq: Two times a day (BID) | ORAL | Status: DC

## 2018-11-09 MED ORDER — ONDANSETRON HCL 4 MG/2ML IJ SOLN
4.0000 mg | Freq: Four times a day (QID) | INTRAMUSCULAR | Status: DC | PRN
  Filled 2018-11-09: qty 2

## 2018-11-09 MED ORDER — DOCUSATE SODIUM 283 MG RE ENEM
1.0000 | ENEMA | RECTAL | Status: DC | PRN
  Filled 2018-11-09: qty 1

## 2018-11-09 MED ORDER — CYCLOSPORINE MODIFIED (NEORAL) 25 MG PO CAPS: 75.0000 mg | ORAL_CAPSULE | Freq: Two times a day (BID) | ORAL | Status: DC

## 2018-11-09 MED ORDER — SORBITOL 70 % SOLN: 30.0000 mL | Status: DC | PRN

## 2018-11-09 NOTE — Consult Note (Addendum)
Neurology Consultation  Reason for Consult: Stroke Referring Physician: Dr. Maryan Rued, EDP, Dr. Lorin Mercy, hospitalist  CC: Lethargic, slurred speech  History is obtained from: Patient, chart, patient's wife at bedside  HPI: [name] is a 58 y.o. male past medical history of congestive heart failure status post heart transplant on immunosuppression, atrial fibrillation, history of old right-sided stroke with residual minimal left-sided weakness more prominent on exertion and tiredness, diabetes, hypertension, hyperlipidemia, history of sleep apnea not requiring CPAP after heart transplant and weight reduction, ESRD on hemodialysis, brought into the hospital because of increasing lethargy, some confusion and emotional lability. According to his wife, he was in his usual state of health 2 or 3 days ago any started becoming more emotionally labile sometimes aggressive as well as the residual left facial droop, which usually was only prominent when he was very tired, more so towards the end of the day, had started becoming more apparent.  The wife says that he has bad neuropathy and has been taking excessive medications for a-Cymbalta and opiates. Also of concern was slurred speech, increasing fatigue, insomnia-much worse than before. He was evaluated by the ED provider in the emergency room, noncontrast CT of the head was done which revealed a right frontal subacute stroke as had been read by the radiologist but upon my review and discussion with neuroradiology, comparing it with the MRI from 2010, that is likely the old stroke from 2010. An MRI was performed in the emergency room which I personally reviewed as well which shows no acute process including no acute stroke. Patient was sick with pneumonia-like symptoms a week ago.  His neuropathy medications were changed from gabapentin to Lyrica and Cymbalta was added.  The wife also thinks that Cymbalta might not be a good choice for him as he becomes  more lethargic on it as has been his experience prior. Wife also reports that over the past year or 2, he has been having some trouble with short-term memory and also with his behavior at intermittent episodes of emotional lability and times of frustration when he is not able to put his thoughts all the way in his words.  She is also noted some personality changes, mostly increased aggression at times when he is frustrated.  Wife reports Afib was part of the heart transplant and he has nothad it since then. Not on anticoagulation  LKW: 2 to 3 days ago tpa given?: no, not a stroke Premorbid modified Rankin scale (mRS): 2  ROS: ROS was performed and is negative except as noted in the HPI.    Past Medical History:  Diagnosis Date  . Arthritis   . Atrial fibrillation ([place])   . CHF (congestive heart failure), NYHA class III (HCC)    s/p heart transplant  . Chronic kidney disease    end stage  . Chronic systolic dysfunction of left ventricle   . CVA (cerebral infarction)   . Diabetes mellitus    PMH; Prior to heart transplant  . Family history of coronary artery disease    in both parents  . Hyperlipidemia   . Hypertension   . Left bundle branch block   . Morbid obesity ([place])    status post lap band  . Myocardial infarction [facility])    prior to heart transplant  . Nonischemic cardiomyopathy ([place])    prior to heart transplant  . Obesity (BMI 30-39.9)   . Obstructive sleep apnea    no longer needs CPAP after heart transplant per pt  .  Premature ventricular contractions   . Prostate cancer ([place])   . SOB (shortness of breath)     Family History  Problem Relation Age of Onset  . Coronary artery disease Father        had, PTCA & CABG  . Heart attack Father   . Hypertension Father   . Diabetes Father   . Prostate cancer Father   . Coronary artery disease Mother   . Heart attack Mother   . Hypertension Mother   . Stroke Mother   . Lupus Mother   . Prostate cancer Brother   .  Prostate cancer Paternal Uncle    Social History:   reports that he has never smoked. He has quit using smokeless tobacco.  His smokeless tobacco use included snuff. He reports that he does not drink alcohol or use drugs. No tobacco alcohol or drug use. Retired Barista  Medications  Current Facility-Administered Medications:  .  acetaminophen (TYLENOL) tablet 650 mg, 650 mg, Oral, Q6H PRN **OR** acetaminophen (TYLENOL) suppository 650 mg, 650 mg, Rectal, Q6H PRN, Karmen Bongo, MD .  aspirin chewable tablet 81 mg, 81 mg, Oral, Daily, Karmen Bongo, MD, 81 mg at 11/09/18 1437 .  calcium carbonate (dosed in mg elemental calcium) suspension 500 mg of elemental calcium, 500 mg of elemental calcium, Oral, Q6H PRN, Karmen Bongo, MD .  camphor-menthol [facility]) lotion 1 application, 1 application, Topical, H8E PRN **AND** hydrOXYzine (ATARAX/VISTARIL) tablet 25 mg, 25 mg, Oral, Q8H PRN, Karmen Bongo, MD .  Derrill Memo ON 11/10/2018] cinacalcet (SENSIPAR) tablet 60 mg, 60 mg, Oral, Q breakfast, Karmen Bongo, MD .  cycloSPORINE modified (NEORAL) capsule 100 mg, 100 mg, Oral, BID, 100 mg at 11/09/18 1602 **AND** cycloSPORINE modified (NEORAL) capsule 75 mg, 75 mg, Oral, BID, Karmen Bongo, MD, 75 mg at 11/09/18 1436 .  docusate sodium (ENEMEEZ) enema 283 mg, 1 enema, Rectal, PRN, Karmen Bongo, MD .  DULoxetine (CYMBALTA) DR capsule 30 mg, 30 mg, Oral, Daily, Karmen Bongo, MD, 30 mg at 11/09/18 1437 .  feeding supplement (NEPRO CARB STEADY) liquid 237 mL, 237 mL, Oral, TID PRN, Karmen Bongo, MD .  HYDROcodone-acetaminophen (NORCO/VICODIN) 5-325 MG per tablet 1 tablet, 1 tablet, Oral, Q6H PRN, Karmen Bongo, MD .  mycophenolate (MYFORTIC) EC tablet 720 mg, 720 mg, Oral, BID, Karmen Bongo, MD, 720 mg at 11/09/18 1436 .  ondansetron (ZOFRAN) tablet 4 mg, 4 mg, Oral, Q6H PRN **OR** ondansetron (ZOFRAN) injection 4 mg, 4 mg, Intravenous, Q6H PRN, Karmen Bongo, MD .  oxyCODONE (Oxy  IR/ROXICODONE) immediate release tablet 5-10 mg, 5-10 mg, Oral, Q6H PRN, Karmen Bongo, MD .  pantoprazole (PROTONIX) EC tablet 40 mg, 40 mg, Oral, Daily, Karmen Bongo, MD, 40 mg at 11/09/18 1437 .  pravastatin (PRAVACHOL) tablet 20 mg, 20 mg, Oral, q1800, Karmen Bongo, MD, 20 mg at 11/09/18 1602 .  senna (SENOKOT) tablet 17.2 mg, 2 tablet, Oral, Daily PRN, Karmen Bongo, MD .  sorbitol 70 % solution 30 mL, 30 mL, Oral, PRN, Karmen Bongo, MD .  zolpidem (AMBIEN) tablet 5 mg, 5 mg, Oral, QHS PRN, Karmen Bongo, MD  Exam: Current vital signs: BP 112/65 (BP Location: Left Arm)   Pulse 72   Temp 98.1 F (36.7 C) (Oral)   Resp 16   Ht 6\' 7"  (2.007 m)   Wt (!) 149.7 kg   SpO2 99%   BMI 37.18 kg/m  Vital signs in last 24 hours: Temp:  [97.4 F (36.3 C)-98.6 F (37 C)] 98.1 F (36.7  C) (03/15 1804) Pulse Rate:  [65-77] 72 (03/15 1804) Resp:  [11-19] 16 (03/15 1804) BP: (111-137)/(65-85) 112/65 (03/15 1804) SpO2:  [91 %-100 %] 99 % (03/15 1804) Weight:  [149.7 kg] 149.7 kg (03/15 1638) General: Awake alert in no distress HEENT: Normocephalic atraumatic dry mucous membranes Chest clear to auscultation bilaterally with distant breath sounds CVS: Midline scar from the heart transplant.  S1-S2 heard. Extremities: Trace pedal edema bilaterally Abdomen: Nontender, obese. Neurological exam Patient is awake alert in no distress He is bradyphrenic and has reduced attention concentration. His speech is moderately dysarthric. Naming comprehension and repetition are intact.  He is fluent. Cranial nerves: Pupils are equal round reactive to light, extraocular movements intact, visual fields are full to confrontation, left lower facial weakness seen, facial sensation intact bilaterally, auditory acuity intact bilaterally, palate midline, shoulder shrug intact, tongue midline. Motor exam: 5/5 with no vertical drift in all 4 extremities. Tone is normal.  Bulk is normal. Sensory exam:  Intact to light touch all over with no extinction although he does have a stocking type pattern of loss of light touch in both lower extremities up to the knees. Coordination: Intact finger-nose-finger.  On outstretched arms there is mild asterixis present. Gait testing was deferred at this time. Areflexic all throughout. NIHSS-1 for dysarthria   Labs I have reviewed labs in epic and the results pertinent to this consultation are:  CBC    Component Value Date/Time   WBC 7.1 11/09/2018 0745   RBC 3.29 (L) 11/09/2018 0745   HGB 9.9 (L) 11/09/2018 0745   HCT 30.4 (L) 11/09/2018 0745   PLT 205 11/09/2018 0745   MCV 92.4 11/09/2018 0745   MCH 30.1 11/09/2018 0745   MCHC 32.6 11/09/2018 0745   RDW 14.3 11/09/2018 0745   LYMPHSABS 0.6 (L) 11/09/2018 0745   MONOABS 0.5 11/09/2018 0745   EOSABS 0.0 11/09/2018 0745   BASOSABS 0.1 11/09/2018 0745    CMP     Component Value Date/Time   NA 138 11/09/2018 0745   K 4.8 11/09/2018 0745   CL 94 (L) 11/09/2018 0745   CO2 27 11/09/2018 0745   GLUCOSE 100 (H) 11/09/2018 0745   BUN 33 (H) 11/09/2018 0745   CREATININE 9.97 (H) 11/09/2018 0745   CALCIUM 9.7 11/09/2018 0745   PROT 6.6 11/09/2018 0745   ALBUMIN 3.4 (L) 11/09/2018 0745   AST 23 11/09/2018 0745   ALT 15 11/09/2018 0745   ALKPHOS 67 11/09/2018 0745   BILITOT 0.9 11/09/2018 0745   GFRNONAA 5 (L) 11/09/2018 0745   GFRAA 6 (L) 11/09/2018 0745   Imaging I have reviewed the images obtained:  CT-scan of the brain--read by general radiology as possible subacute stroke but on comparison with an MRI from 2010, likely chronic right MCA infarction already seen on the MRI in 2010.  Discussed with neuroradiology to confirm and they agree with my interpretation.  MRI examination of the brain-no acute changes.  No new stroke.  No bleed.  Chronic white matter disease.  Evidence of right frontal encephalomalacia from the stroke likely the one from 2010.  Occasional chronic microhemorrhages  also seen on the MRI.  MRA head stable from 2010  Assessment: 58 year old man past medical history of congestive heart failure status post heart transplant on immunosuppression, atrial fibrillation, history of old right-sided stroke with residual minimal left-sided weakness more prominent on exertion and tiredness at baseline, diabetes, hypertension hyperlipidemia history of sleep apnea currently not requiring CPAP after his heart transplant and  weight reduction, ESRD on hemodialysis brought into the hospital because of increasing lethargy, confusion, slurred speech which has been more than usual over the past 2 to 3 days. There is no clear-cut evidence that there was a sudden onset of focal neurological deficit. The CT scan finding of hypodensity likely reflects the sequela of the old stroke from 2010. The MRI confirms that there is no new stroke. I suspect that his current presentation is likely toxic metabolic encephalopathy which might be multifactorial- overmedication, metabolic derangements, dialysis disequilibrium syndrome.  I would not recommend doing any stroke work-up at this time.  Impression: Toxic Metabolic encephalopathy  Recommendations:  TOXIC METABOLIC ENCEPHALOPATHY, PERIPHERAL NEUROPATHY -My recommendation would be to reduce sedating medications and optimize his pain medications with his deranged renal function in mind. He has been overmedicating himself with opiates. Cymbalta has also not been good for him according to his wife.  I would recommend discontinuing Cymbalta.  For his insomnia today, I am adding melatonin nightly.  -As an alternative or adjuvant to Lyrica, nortriptyline or amitriptyline can be tried but they are also pretty heavily sedating.  Can attempt starting at very low doses to see how he tolerates it.  -Again the mainstay of treatment would be to reduce the sedating medications, although he has severe neuropathy from his diabetes and also concurrent use  of immunosuppressants, which definitely is very bothersome and might be an ongoing dialysis to manage his pain medications.  -Management of renal disease per primary team as you are.  CONCERN FOR STROKE - MRI NEGATIVE, LESS LIKELY THAT THIS IS STROKE -I would not pursue any stroke work-up.  I have canceled the lipid panel.  Continue home statin for stroke prevention.   -Echocardiogram has already been done and shows normal ejection fraction, severe left atrial dilatation.  HISTORY OF ATRIAL FIBRILLATION -Per wife, not in Afib (was "part of heart transplant"). Will defer to cardiology (his cardiology team is at [facility]). If he has Afib, he should be on anticoag for stroke prevention. Otherwise, ASA is fine.  SLEEP APNEA -Had been on CPAP before but discontinued after heart surgery and weight reduction.  -Might benefit from outpatient sleep study to reassess given constant fatigue, increasing lethargy, insomnia.   Neurology will be available as needed.  Please call with questions.  -- Amie Portland, MD [facility] Pager: [phone] If 7pm to 7am, please call on call as listed on AMION.

## 2018-11-09 NOTE — Plan of Care (Signed)
  Problem: Clinical Measurements: Goal: Ability to maintain clinical measurements within normal limits will improve Outcome: Progressing   Problem: Safety: Goal: Ability to remain free from injury will improve Outcome: Progressing   

## 2018-11-09 NOTE — ED Notes (Signed)
Report attempted and was advised the RN is not ready and to call back in 10 minutes.

## 2018-11-09 NOTE — Progress Notes (Signed)
  Echocardiogram 2D Echocardiogram has been performed.  [name] 11/09/2018, 4:00 PM

## 2018-11-09 NOTE — ED Notes (Signed)
Patient's wife at bedside states that she noticed that patient's speech seemed more slurred last night around 7 or 8pm and the L side of his face was more droopy than it usually is. She reports that patient has some deficits from a previous cva that cause him to have some L sided facial drooping and L leg weakness at baseline.

## 2018-11-09 NOTE — ED Provider Notes (Signed)
[facility] EMERGENCY DEPARTMENT Provider Note   CSN: [id number] Arrival date & time: 11/09/18  7829    History   Chief Complaint Chief Complaint  Patient presents with   Stroke Symptoms    HPI [name] is a 58 y.o. male.     Patient is a 58 year old male with multiple medical problems including heart transplant, end-stage renal disease on dialysis, diabetes, CVA, morbid obesity, hypertension who is presenting today with altered mental status.  Patient was seen in the emergency room on 10/21/2018 and at that time he had recently been switched from Lyrica to Cymbalta and was having worsening peripheral neuropathy pain.  Also at that visit and x-ray of the chest showed concern for left lower lobe pneumonia and he had noted a dry cough.  Patient was switched from Cymbalta back to his Lyrica and started on doxycycline.  Patient initially started feeling better but then last week had multiple days of vomiting and nausea.  His last dialysis was a full course on Friday but when he got there he was at his dry weight.  He continues to have a mild dry cough but denies any chest pain, shortness of breath.  He states he will have intermittent abdominal cramps but no localized abdominal pain.  No diarrhea.  No fever that he is aware of.  He has had terrible neuropathic pain in his feet and has been taking opiates intermittently.  Family member noted yesterday that he was not acting himself.  She states he was just off.  He was saying things that were not accurate and he was intermittently crying.  The symptoms have only worsened this morning.  He is having some mild confusion and states he just does not feel himself.  Something feels off.  He is intermittently tearful and his family member states that is not like him at all.  He is concerned that he is not taking his medication correctly.  She was under the understanding that he should stop the Cymbalta altogether and just do Lyrica  but he thinks he has been taking both as well as being on citalopram.  He denies any over-the-counter medication.  The history is provided by the patient and a relative.    Past Medical History:  Diagnosis Date   Arthritis    Atrial fibrillation (HCC)    CHF (congestive heart failure), NYHA class III (HCC)    s/p heart transplant   Chronic kidney disease    end stage   Chronic systolic dysfunction of left ventricle    CVA (cerebral infarction)    Diabetes mellitus    PMH; Prior to heart transplant   Family history of coronary artery disease    in both parents   Hyperlipidemia    Hypertension    Left bundle branch block    Morbid obesity ([place])    status post lap band   Myocardial infarction [facility])    prior to heart transplant   Nonischemic cardiomyopathy ([place])    prior to heart transplant   Obesity (BMI 30-39.9)    Obstructive sleep apnea    no longer needs CPAP after heart transplant per pt   Premature ventricular contractions    Prostate cancer ([place])    SOB (shortness of breath)    Stroke [facility])     Patient Active Problem List   Diagnosis Date Noted   Malignant neoplasm of prostate ([place]) 05/06/2018   Closed posterior dislocation of hip, right, initial encounter ([place]) 08/24/2017  End stage renal disease on dialysis ([place]) 08/24/2017   Hip dislocation, right ([place]) 08/24/2017   Osteoarthritis of right hip 07/22/2017   VENTRICULAR TACHYCARDIA 12/21/2009   HYPERLIPIDEMIA 08/30/2009   HYPERTENSION, UNSPECIFIED 08/30/2009   ISCHEMIC CARDIOMYOPATHY 08/30/2009   ATRIAL FIBRILLATION 08/30/2009   VENTRICULAR ECTOPY 08/30/2009   CEREBROVASCULAR ACCIDENT, HX OF 08/30/2009   MORBID OBESITY, HX OF 08/30/2009   DIABETES MELLITUS, TYPE II 10/17/2007   OBSTRUCTIVE SLEEP APNEA 10/17/2007   CARDIOMYOPATHY, DILATED 10/17/2007    Past Surgical History:  Procedure Laterality Date   CARDIAC PACEMAKER PLACEMENT  09/21/2009   Biventricular  implantable cardioverter-defibrillator implantation      COLONOSCOPY W/ BIOPSIES AND POLYPECTOMY     FOOT SURGERY     left   HEART TRANSPLANT  2014   LAPAROSCOPIC GASTRIC BANDING  01/27/2007   LEFT VENTRICULAR ASSIST DEVICE     implanted at [place] Right 07/22/2017   Procedure: RIGHT TOTAL HIP ARTHROPLASTY ANTERIOR APPROACH;  Surgeon: Rod Can, MD;  Location: [place];  Service: Orthopedics;  Laterality: Right;  Needs RNFA   TOTAL KNEE ARTHROPLASTY Right 07/22/2017        Home Medications    Prior to Admission medications   Medication Sig Start Date End Date Taking? Authorizing Provider  aspirin 81 MG chewable tablet Chew 1 tablet (81 mg total) by mouth 2 (two) times daily. Patient taking differently: Chew 81 mg by mouth daily.  07/23/17   Swinteck, Aaron Edelman, MD  B Complex-C-Folic Acid (RENO CAPS PO) Take 1 capsule by mouth daily at 12 noon.  01/08/18   [provider]  B Complex-C-Folic Acid (RENO CAPS) 1 MG CAPS Take 1 capsule by mouth daily. 08/09/17   [provider]  b complex-vitamin c-folic acid (NEPHRO-VITE) 0.8 MG TABS tablet Take 1 tablet by mouth daily.  03/27/18   [provider]  cephALEXin (KEFLEX) 500 MG capsule Take 1 capsule (500 mg total) by mouth 3 (three) times daily. Patient not taking: Reported on 10/21/2018 12/23/17   Wallene Huh, DPM  cinacalcet (SENSIPAR) 60 MG tablet Take 60 mg by mouth daily.     [provider]  citalopram (CELEXA) 20 MG tablet Take 20 mg by mouth daily.  04/06/16   [provider]  Cyanocobalamin (NASCOBAL) 500 MCG/0.1ML SOLN Place 500 mcg into the nose daily as needed (energy boost).    [provider]  cycloSPORINE modified (NEORAL) 100 MG capsule Take 100 mg by mouth 2 (two) times daily. Take one capsule (100 mg) by mouth twice daily with 3 capsules (75 mg) for a total dose of 175 mg twice daily (both modified)     [provider]  cycloSPORINE modified (NEORAL) 25 MG capsule Take 75 mg by mouth 2 (two) times daily. Take with 100 mg tablet to equal a total of 175 mg 08/05/18 08/05/19  [provider]  docusate sodium (COLACE) 100 MG capsule Take 1 capsule (100 mg total) by mouth 2 (two) times daily. Patient taking differently: Take 100 mg by mouth 2 (two) times daily as needed (constipation).  07/23/17   Swinteck, Aaron Edelman, MD  DULoxetine (CYMBALTA) 30 MG capsule Take 30 mg by mouth daily.  10/01/18   [provider]  HYDROcodone-acetaminophen (NORCO/VICODIN) 5-325 MG tablet Take 1 tablet by mouth every 6 (six) hours as needed for moderate pain.     [provider]  lidocaine (XYLOCAINE) 5 % ointment Apply 1 application  topically as needed. Apply to feet twice daily as needed. 01/03/17   Narda Amber K, DO  lidocaine-prilocaine (EMLA) cream Apply 1 application topically as needed (port).  12/09/17   [provider]  mycophenolate (MYFORTIC) 360 MG TBEC EC tablet Take 720 mg by mouth 2 (two) times daily.    [provider]  omeprazole (PRILOSEC) 20 MG capsule Take 20 mg by mouth daily.  04/06/16   [provider]  ondansetron (ZOFRAN) 4 MG tablet Take 1 tablet (4 mg total) by mouth every 6 (six) hours as needed for nausea. 07/23/17   Swinteck, Aaron Edelman, MD  oxyCODONE (OXY IR/ROXICODONE) 5 MG immediate release tablet Take 1-2 tablets (5-10 mg total) by mouth every 6 (six) hours as needed for breakthrough pain. Patient not taking: Reported on 10/21/2018 07/23/17   Rod Can, MD  pravastatin (PRAVACHOL) 40 MG tablet Take 1 tablet (40 mg total) by mouth daily at 6 PM. Patient taking differently: Take 20 mg by mouth at bedtime.  07/24/17   Swinteck, Aaron Edelman, MD  pregabalin (LYRICA) 100 MG capsule Take 1 capsule (100 mg total) by mouth 3 (three) times daily. 10/21/18   Khatri, Hina, PA-C  senna (SENOKOT) 8.6 MG TABS tablet Take 2 tablets (17.2 mg total) by mouth at bedtime. Patient  taking differently: Take 2 tablets by mouth daily as needed for mild constipation.  07/23/17   Swinteck, Aaron Edelman, MD  sevelamer carbonate (RENVELA) 800 MG tablet Take 800-2,400 mg by mouth See admin instructions. Take (1600-2400 mg) by mouth three times daily with meals, take  (800 mg) with snacks    [provider]  tadalafil (CIALIS) 20 MG tablet Take 20 mg by mouth daily as needed for erectile dysfunction.    [provider]  VELPHORO 500 MG chewable tablet Chew 1,000 mg by mouth 3 (three) times daily with meals.  12/10/17   [provider]  venlafaxine (EFFEXOR) 37.5 MG tablet Take half tablet x 1 month, then increase to 1 tablet daily.  On days of dialysis, take afterwards. Patient not taking: Reported on 10/21/2018 07/01/17   Alda Berthold, DO    Family History Family History  Problem Relation Age of Onset   Coronary artery disease Father        had, PTCA & CABG   Heart attack Father    Hypertension Father    Diabetes Father    Prostate cancer Father    Coronary artery disease Mother    Heart attack Mother    Hypertension Mother    Stroke Mother    Lupus Mother    Prostate cancer Brother    Prostate cancer Paternal Uncle     Social History Social History   Tobacco Use   Smoking status: Never Smoker   Smokeless tobacco: Former Systems developer    Types: Snuff  Substance Use Topics   Alcohol use: No   Drug use: No     Allergies   Heparin and Penicillin g potassium [penicillin g]   Review of Systems Review of Systems  All other systems reviewed and are negative.    Physical Exam Updated Vital Signs BP 125/73    Pulse 77    Temp (!) 97.4 F (36.3 C) (Oral)    Resp 18    SpO2 96%   Physical Exam Vitals signs and nursing note reviewed.  Constitutional:      General: He is not in acute distress.    Appearance: He is well-developed. He is obese.  Comments: Intermittently tearful on exam  HENT:     Head: Normocephalic and  atraumatic.     Nose: Nose normal.     Mouth/Throat:     Mouth: Mucous membranes are moist.  Eyes:     Conjunctiva/sclera: Conjunctivae normal.     Pupils: Pupils are equal, round, and reactive to light.  Neck:     Musculoskeletal: Normal range of motion and neck supple.  Cardiovascular:     Rate and Rhythm: Normal rate and regular rhythm.     Heart sounds: No murmur.  Pulmonary:     Effort: Pulmonary effort is normal. No respiratory distress.     Breath sounds: Normal breath sounds. No wheezing or rales.  Abdominal:     General: There is no distension.     Palpations: Abdomen is soft.     Tenderness: There is no abdominal tenderness. There is no guarding or rebound.  Musculoskeletal: Normal range of motion.        General: No tenderness.     Comments: Fistula in the right upper arm  Skin:    General: Skin is warm and dry.     Findings: No erythema or rash.  Neurological:     Mental Status: He is alert and oriented to person, place, and time.     Cranial Nerves: No cranial nerve deficit.     Sensory: Sensation is intact.     Gait: Gait is intact.     Comments: No aphasia but occasional slurred speech.  Also occasionally will talk about things that are not relevant to the conversation.  Is able to do finger-to-nose but slightly off.  5 out of 5 strength in bilateral upper and lower extremities.  No visual field cut.  Psychiatric:        Mood and Affect: Affect is labile and tearful.     Comments: Occasional slurred speech and occasional inappropriate comments that does not fit with the conversation      ED Treatments / Results  Labs (all labs ordered are listed, but only abnormal results are displayed) Labs Reviewed  CBC WITH DIFFERENTIAL/PLATELET - Abnormal; Notable for the following components:      Result Value   RBC 3.29 (*)    Hemoglobin 9.9 (*)    HCT 30.4 (*)    Lymphs Abs 0.6 (*)    Abs Immature Granulocytes 0.08 (*)    All other components within normal limits   COMPREHENSIVE METABOLIC PANEL - Abnormal; Notable for the following components:   Chloride 94 (*)    Glucose, Bld 100 (*)    BUN 33 (*)    Creatinine, Ser 9.97 (*)    Albumin 3.4 (*)    GFR calc non Af Amer 5 (*)    GFR calc Af Amer 6 (*)    Anion gap 17 (*)    All other components within normal limits  AMMONIA - Abnormal; Notable for the following components:   Ammonia <9 (*)    All other components within normal limits  LACTIC ACID, PLASMA  SALICYLATE LEVEL  ACETAMINOPHEN LEVEL  I-STAT TROPONIN, ED    EKG EKG Interpretation  Date/Time:  Sunday November 09 2018 07:12:36 EDT Ventricular Rate:  74 PR Interval:    QRS Duration: 170 QT Interval:  469 QTC Calculation: 521 R Axis:   -71 Text Interpretation:  Sinus rhythm RBBB and LAFB Probable left ventricular hypertrophy No significant change since last tracing Confirmed by Blanchie Dessert [phone]) on 11/09/2018 7:50:17 AM  Radiology Dg Chest 2 View  Result Date: 11/09/2018 CLINICAL DATA:  Mental status changes.  Slurred speech. EXAM: CHEST - 2 VIEW COMPARISON:  10/21/2018 FINDINGS: Previous median sternotomy. Chronic cardiomegaly. Pulmonary venous hypertension. Pulmonary arteries prominent consistent pulmonary arterial hypertension. No frank edema. Mild chronic volume loss left base. No infiltrate, new collapse or effusion. Lead fragment again projects over the left heart. IMPRESSION: Cardiomegaly. Pulmonary arterial and pulmonary venous hypertension. No frank edema. No consolidation or new collapse. Mild chronic volume loss left base. Electronically Signed   By: Nelson Chimes M.D.   On: 11/09/2018 08:22   Ct Head Wo Contrast  Result Date: 11/09/2018 CLINICAL DATA:  58 year old male with acute LEFT sided weakness for 1 week. EXAM: CT HEAD WITHOUT CONTRAST TECHNIQUE: Contiguous axial images were obtained from the base of the skull through the vertex without intravenous contrast. COMPARISON:  05/15/2012 CT and 09/06/2008 MR FINDINGS:  Brain: A small to moderate RIGHT frontoparietal infarct is noted without hemorrhage. Mild chronic small-vessel white matter ischemic changes noted. No extra-axial collection, mass lesion, midline shift or hydrocephalus. Vascular: No hyperdense vessel or unexpected calcification. Skull: Normal. Negative for fracture or focal lesion. Sinuses/Orbits: No acute finding. Other: None. IMPRESSION: 1. RIGHT frontoparietal infarct without hemorrhage, likely subacute given history. 2. Mild chronic small-vessel white matter ischemic changes. Electronically Signed   By: Margarette Canada M.D.   On: 11/09/2018 08:11    Procedures Procedures (including critical care time)  Medications Ordered in ED Medications - No data to display   Initial Impression / Assessment and Plan / ED Course  I have reviewed the triage vital signs and the nursing notes.  Pertinent labs & imaging results that were available during my care of the patient were reviewed by me and considered in my medical decision making (see chart for details).       Patient presented today with mild altered mental status.  This could be a result of inappropriate medications causing some mild delirium versus early infection versus intracranial hemorrhage versus the metabolic encephalopathy vs toxic cause vs stroke.  Patient is in no acute distress at this time.  Vital signs are reassuring.  He denies any infectious symptoms but has a mild dry cough.  However patient is a heart transplant patient and immunocompromise.  He had completed a course of doxycycline 2-1/2 weeks ago for suspected left lower lobe pneumonia but denies any chest pain shortness of breath or fever currently.  Feel that he should have stopped the Cymbalta but continues to take Cymbalta Lyrica and citalopram now and suspicious that these may be some side effects of that.  However last week he was also having a lot of nausea and vomiting.  Patient's last dialysis was on Friday and he is due for  dialysis tomorrow. Labs are pending and chest x-ray and head CT pending.  Given sx have been at least for 32 hours this does not meet criteria for code stroke.  9:20 AM EKG without acute findings, chest x-ray showing that pneumonia has resolved.  CBC, CMP, lactate, troponin, ammonia level all within normal limits or unchanged from baseline.  Head CT showing right parietal frontal infarct without hemorrhage which may be subacute.  On reevaluation patient is unchanged.  Wife also states he has not been sleeping well and had some insomnia.  Feel that he is still having some delirium and some of that is related to medications but also in the setting of new stroke will discuss with neurology.  10:29 AM  Discussed with Dr. Cheral Marker who will consult on the pt in the ER and will admit to medicine.  Final Clinical Impressions(s) / ED Diagnoses   Final diagnoses:  Delirium  Cerebrovascular accident (CVA), unspecified mechanism [facility])    ED Discharge Orders    None       Blanchie Dessert, MD 11/09/18 1029

## 2018-11-09 NOTE — H&P (Signed)
History and Physical    [name]:[id number] DOB: 1960-09-03 DOA: 11/09/2018  PCP: Carol Ada, MD Consultants:  Tammi Klippel - rad onc; Cardiology and nephrology at [facility]; [facility]; Junious Silk - urology Patient coming from:  Home - lives with wife; [facility]: Wife, [phone]  Chief Complaint: Stroke symptoms  HPI: [name] is a 58 y.o. male with medical history significant of CVA; prostate CA; OSA no longer on CPAP; obesity; CAD s/p heart transplant; HTN; HLD; DM; and ESRD on HD presenting with stroke symptoms.  He was here week before last - changed from Lyrica to Cymbalta and developed more neuropathy.  He had "slight pneumonia" and was changed back to Lyrica. He manages his own medicine - for this week he has been taking both and also lots of pain medicines.  Friday night-Saturday AM, he was confused and not his usual self.  He was aggressive with more emotional lability.  In the evenings, he has chronic facial droop worse with sleeping - and this was worse last night and this AM.  This AM, with some apparent delirium.  He had difficulty walking -very weak and numb but not sure if one side (usually right side chronically).  He may have messed up his medications.   ED Course:  "Wasn't acting right" for a few days - emotional lability, speech off.  Worse today with slurring.  Recently changed from Lyrica to Cymbalta and back to Lyrica - ?medication related.  Increased fatigue, mild slurring of speech, mildly inappropriate.  Head CT with new R frontoparietal CVA without hemorrhage.  Neurology to see.  Review of Systems: As per HPI; otherwise review of systems reviewed and negative.  This may be inaccurate due to his deficits.   Ambulatory Status:  Ambulates without assistance or with a cane  Past Medical History:  Diagnosis Date   Arthritis    Atrial fibrillation (HCC)    CHF (congestive heart failure), NYHA class III (HCC)    s/p heart transplant   Chronic kidney disease      end stage   Chronic systolic dysfunction of left ventricle    CVA (cerebral infarction)    Diabetes mellitus    PMH; Prior to heart transplant   Family history of coronary artery disease    in both parents   Hyperlipidemia    Hypertension    Left bundle branch block    Morbid obesity ([place])    status post lap band   Myocardial infarction [facility])    prior to heart transplant   Nonischemic cardiomyopathy ([place])    prior to heart transplant   Obesity (BMI 30-39.9)    Obstructive sleep apnea    no longer needs CPAP after heart transplant per pt   Premature ventricular contractions    Prostate cancer (HCC)    SOB (shortness of breath)     Past Surgical History:  Procedure Laterality Date   CARDIAC PACEMAKER PLACEMENT  09/21/2009   Biventricular implantable cardioverter-defibrillator implantation      COLONOSCOPY W/ BIOPSIES AND POLYPECTOMY     FOOT SURGERY     left   HEART TRANSPLANT  2014   LAPAROSCOPIC GASTRIC BANDING  01/27/2007   LEFT VENTRICULAR ASSIST DEVICE     implanted at [place] Right 07/22/2017   Procedure: RIGHT TOTAL HIP ARTHROPLASTY ANTERIOR APPROACH;  Surgeon: Rod Can, MD;  Location: [place];  Service: Orthopedics;  Laterality: Right;  Needs RNFA  TOTAL KNEE ARTHROPLASTY Right 07/22/2017    Social History   Socioeconomic History   Marital status: Married    Spouse name: Sales executive   Number of children: 1   Years of education: 16   Highest education level: Not on file  Occupational History   Occupation: retired    Fish farm manager: [place] resource strain: Not on file   Food insecurity:    Worry: Not on file    Inability: Not on file   Transportation needs:    Medical: Not on file    Non-medical: Not on file  Tobacco Use   Smoking status: Never Smoker   Smokeless tobacco: Former Systems developer    Types: Snuff  Substance and Sexual Activity    Alcohol use: No   Drug use: No   Sexual activity: Not Currently  Lifestyle   Physical activity:    Days per week: Not on file    Minutes per session: Not on file   Stress: Not on file  Relationships   Social connections:    Talks on phone: Not on file    Gets together: Not on file    Attends religious service: Not on file    Active member of club or organization: Not on file    Attends meetings of clubs or organizations: Not on file    Relationship status: Not on file   Intimate partner violence:    Fear of current or ex partner: Not on file    Emotionally abused: Not on file    Physically abused: Not on file    Forced sexual activity: Not on file  Other Topics Concern   Not on file  Social History Narrative   Lives with wife in a one story home.  Has one child.     Retired from the [employer].  Supervisor over transportation.    Education: college.     Allergies  Allergen Reactions   Heparin Nausea Only, Swelling and Other (See Comments)     * * HIT * * SWELLING REACTION UNSPECIFIED  DIAPHORESIS   Penicillin G Potassium [Penicillin G] Nausea Only and Other (See Comments)    DIAPHORESIS    Family History  Problem Relation Age of Onset   Coronary artery disease Father        had, PTCA & CABG   Heart attack Father    Hypertension Father    Diabetes Father    Prostate cancer Father    Coronary artery disease Mother    Heart attack Mother    Hypertension Mother    Stroke Mother    Lupus Mother    Prostate cancer Brother    Prostate cancer Paternal Uncle     Prior to Admission medications   Medication Sig Start Date End Date Taking? Authorizing Provider  aspirin 81 MG chewable tablet Chew 1 tablet (81 mg total) by mouth 2 (two) times daily. Patient taking differently: Chew 81 mg by mouth daily.  07/23/17  Yes Swinteck, Aaron Edelman, MD  B Complex-C-Folic Acid (RENO CAPS) 1 MG CAPS Take 1 capsule by mouth daily. 08/09/17  Yes [provider]  citalopram (CELEXA) 20 MG tablet Take 20 mg by mouth daily.  04/06/16  Yes [provider]  cycloSPORINE modified (NEORAL) 100 MG capsule Take 175 mg by mouth 2 (two) times daily. Take one capsule (100 mg) by mouth twice daily with 3 capsules (75 mg) for a total dose of 175 mg  twice daily (both modified)    Yes [provider]  cycloSPORINE modified (NEORAL) 25 MG capsule Take 75 mg by mouth 2 (two) times daily. Take with 100 mg tablet to equal a total of 175 mg 08/05/18 08/05/19 Yes [provider]  DULoxetine (CYMBALTA) 30 MG capsule Take 30 mg by mouth daily.  10/01/18  Yes [provider]  HYDROcodone-acetaminophen (NORCO/VICODIN) 5-325 MG tablet Take 1 tablet by mouth every 6 (six) hours as needed for moderate pain.    Yes [provider]  lidocaine (XYLOCAINE) 5 % ointment Apply 1 application topically as needed. Apply to feet twice daily as needed. 01/03/17  Yes Patel, Donika K, DO  lidocaine-prilocaine (EMLA) cream Apply 1 application topically as needed (port).  12/09/17  Yes [provider]  Melatonin 10 MG TABS Take 10 mg by mouth as needed (sleep).   Yes [provider]  mycophenolate (MYFORTIC) 360 MG TBEC EC tablet Take 720 mg by mouth 2 (two) times daily.   Yes [provider]  omeprazole (PRILOSEC) 20 MG capsule Take 20 mg by mouth daily.  04/06/16  Yes [provider]  ondansetron (ZOFRAN) 4 MG tablet Take 1 tablet (4 mg total) by mouth every 6 (six) hours as needed for nausea. Patient taking differently: Take 4 mg by mouth daily.  07/23/17  Yes Swinteck, Aaron Edelman, MD  oxyCODONE (OXY IR/ROXICODONE) 5 MG immediate release tablet Take 1-2 tablets (5-10 mg total) by mouth every 6 (six) hours as needed for breakthrough pain. 07/23/17  Yes Swinteck, Aaron Edelman, MD  pravastatin (PRAVACHOL) 40 MG tablet Take 1 tablet (40 mg total) by mouth daily at 6 PM. Patient taking differently: Take 20 mg by mouth at  bedtime.  07/24/17  Yes Swinteck, Aaron Edelman, MD  senna (SENOKOT) 8.6 MG TABS tablet Take 2 tablets (17.2 mg total) by mouth at bedtime. Patient taking differently: Take 2 tablets by mouth daily as needed for mild constipation.  07/23/17  Yes Swinteck, Aaron Edelman, MD  tadalafil (CIALIS) 20 MG tablet Take 20 mg by mouth daily as needed for erectile dysfunction.   Yes [provider]  cephALEXin (KEFLEX) 500 MG capsule Take 1 capsule (500 mg total) by mouth 3 (three) times daily. 12/23/17   Wallene Huh, DPM  cinacalcet (SENSIPAR) 60 MG tablet Take 60 mg by mouth daily.     [provider]  Cyanocobalamin (NASCOBAL) 500 MCG/0.1ML SOLN Place 500 mcg into the nose daily as needed (energy boost).    [provider]  docusate sodium (COLACE) 100 MG capsule Take 1 capsule (100 mg total) by mouth 2 (two) times daily. Patient not taking: Reported on 11/09/2018 07/23/17   Rod Can, MD  pregabalin (LYRICA) 100 MG capsule Take 1 capsule (100 mg total) by mouth 3 (three) times daily. Patient not taking: Reported on 11/09/2018 10/21/18   Delia Heady, PA-C  venlafaxine (EFFEXOR) 37.5 MG tablet Take half tablet x 1 month, then increase to 1 tablet daily.  On days of dialysis, take afterwards. Patient not taking: Reported on 10/21/2018 07/01/17   Alda Berthold, DO    Physical Exam: Vitals:   11/09/18 1000 11/09/18 1032 11/09/18 1035 11/09/18 1230  BP: 116/84 130/84 130/84 137/78  Pulse: 65 68 69 74  Resp: 15 19 12 16   Temp:    (!) 97.5 F (36.4 C)  TempSrc:    Oral  SpO2: 96% 97% 97% 100%      General: Emotionally labile with some dysarthria  Eyes:  PERRL,  EOMI, normal lids, iris  ENT:  grossly normal hearing, lips & tongue, mmm  Neck:  no LAD, masses or thyromegaly; no carotid bruits  Cardiovascular:  RRR, no m/r/g. No LE edema.   Respiratory:   CTA bilaterally with no wheezes/rales/rhonchi.  Normal respiratory effort.  Abdomen:  soft, NT, ND, NABS  Skin:  no rash  or induration seen on limited exam  Musculoskeletal:  grossly normal tone BUE/BLE, good ROM, no bony abnormality  Psychiatric:  grossly normal mood and affect, speech fluent and appropriate, AOx3  Neurologic:  CN 2-12 grossly intact, moves all extremities in coordinated fashion, sensation intact, possible decreased sensation on RLE - difficult to ascertain given his lability and inattention    Radiological Exams on Admission: Dg Chest 2 View  Result Date: 11/09/2018 CLINICAL DATA:  Mental status changes.  Slurred speech. EXAM: CHEST - 2 VIEW COMPARISON:  10/21/2018 FINDINGS: Previous median sternotomy. Chronic cardiomegaly. Pulmonary venous hypertension. Pulmonary arteries prominent consistent pulmonary arterial hypertension. No frank edema. Mild chronic volume loss left base. No infiltrate, new collapse or effusion. Lead fragment again projects over the left heart. IMPRESSION: Cardiomegaly. Pulmonary arterial and pulmonary venous hypertension. No frank edema. No consolidation or new collapse. Mild chronic volume loss left base. Electronically Signed   By: Nelson Chimes M.D.   On: 11/09/2018 08:22   Ct Head Wo Contrast  Result Date: 11/09/2018 CLINICAL DATA:  58 year old male with acute LEFT sided weakness for 1 week. EXAM: CT HEAD WITHOUT CONTRAST TECHNIQUE: Contiguous axial images were obtained from the base of the skull through the vertex without intravenous contrast. COMPARISON:  05/15/2012 CT and 09/06/2008 MR FINDINGS: Brain: A small to moderate RIGHT frontoparietal infarct is noted without hemorrhage. Mild chronic small-vessel white matter ischemic changes noted. No extra-axial collection, mass lesion, midline shift or hydrocephalus. Vascular: No hyperdense vessel or unexpected calcification. Skull: Normal. Negative for fracture or focal lesion. Sinuses/Orbits: No acute finding. Other: None. IMPRESSION: 1. RIGHT frontoparietal infarct without hemorrhage, likely subacute given history. 2. Mild  chronic small-vessel white matter ischemic changes. Electronically Signed   By: Margarette Canada M.D.   On: 11/09/2018 08:11    EKG: Independently reviewed.  NSR with rate 74; RBBB and LAFB, LVH; nonspecific ST changes with no evidence of acute ischemia; NSCSLT   Labs on Admission: I have personally reviewed the available labs and imaging studies at the time of the admission.  Pertinent labs:   BUN 33/Creatinine 9.97/GFR 6 Anion gap 17 Troponin 0.01 Lactate 0.9 WBC 7.1 Hgb 9.9 Tylenol <10 ASA <7  Assessment/Plan Principal Problem:   CVA (cerebral vascular accident) ([place]) Active Problems:   Diabetes mellitus type 2 in obese (HCC)   Dyslipidemia   OBSTRUCTIVE SLEEP APNEA   Essential hypertension   Obesity, Class III, BMI 40-49.9 (morbid obesity) ([place])   End stage renal disease on dialysis ([place])   Malignant neoplasm of prostate (HCC)   H/O heart transplant ([place])    CVA -Patient with complicated medical history including CVA presenting with increased emotional lability and speech difficulties -CT shows a right frontoparietal CVA -Will admit for further CVA evaluation -Telemetry monitoring -MRI/MRA -Carotid dopplers -Echo -Risk stratification with FLP; will also check TSH and UDS -ASA daily - patient reports compliance with this and so may need to transition to Plavix; will defer to neurology -Neurology consult -PT/OT/ST/Nutrition Consults -SW consult for placement  HTN -Allow permissive HTN for now -Treat BP only if >220/120, and then with goal of 15% reduction -He does not appear  to be taking medications chronically for this issue   HLD -Check FLP; 6/19 LDL was 104 -On Pravastatin -Attempted to change to Lipitor, but this is not compatible with cyclosporin -Continue Pravastatin   DM -6/19 A1c was 5.1 -He is not on medications -Will not monitor at this time  ESRD on HD -Patient on chronic MWF HD -Nephrology prn order set utilized -He does not appear to be  volume overloaded or otherwise in need of acute HD -Dr. Justin Mend notified that patient will need HD tomorrow  S/p heart transplant -No evidence of rejection -Continue cyclosporine and myfortic -No significant CAD on cath in 9/18, on ASA and pravastatin  Morbid obesity -s/p bariatric surgery -Current BMI is 36.5  Prostate CA -Received radiation therapy from 11/19-09/02/18 -Following up outpatient with urology     DVT prophylaxis:  Lovenox  Code Status: Full - confirmed with patient/family Family Communication: Daughter present throughout evaluation Disposition Plan:  Home once clinically improved Consults called: Neurology; PT/OT/ST/SW/Nutrition  Admission status: Admit - It is my clinical opinion that admission to INPATIENT is reasonable and necessary because of the expectation that this patient will require hospital care that crosses at least 2 midnights to treat this condition based on the medical complexity of the problems presented.  Given the aforementioned information, the predictability of an adverse outcome is felt to be significant.    Karmen Bongo MD [facility]   How to contact the [facility] Attending or Consulting provider [place] or covering provider during after hours [place], for this patient?  1. Check the care team in [facility] and look for a) attending/consulting [facility] provider listed and b) the [facility] team listed 2. Log into [email or web address] and use [place]'s universal password to access. If you do not have the password, please contact the hospital operator. 3. Locate the [facility] provider you are looking for under [facility] and page to a number that you can be directly reached. 4. If you still have difficulty reaching the provider, please page the [facility] (Director on Call) for the Hospitalists listed on amion for assistance.   11/09/2018, 1:50 PM

## 2018-11-09 NOTE — ED Notes (Signed)
ED Provider at bedside. 

## 2018-11-09 NOTE — ED Notes (Signed)
ED TO INPATIENT HANDOFF REPORT  ED Nurse Name and Phone #: [phone] S Name/Age/Gender Esperanza Sheets 58 y.o. male Room/Bed: 033C/033C  Code Status   Code Status: Prior  Home/SNF/Other Home Patient oriented to: self, place, time and situation Is this baseline? Yes   Triage Complete: Triage complete  Chief Complaint possible stroke  Triage Note Patient reports that for the past 8 days he has had weakness on his left side as well as feeling like his speech sounds different and the L side of his face feels odd. Patient is a/ox4 but speech is mildly slurred, L sided facial droop present. Grip strength decreased in L hand. Patient had full dialysis session yesterday.    Allergies Allergies  Allergen Reactions  . Heparin Nausea Only, Swelling and Other (See Comments)     * * HIT * * SWELLING REACTION UNSPECIFIED  DIAPHORESIS  . Penicillin G Potassium [Penicillin G] Nausea Only and Other (See Comments)    DIAPHORESIS    Level of Care/Admitting Diagnosis ED Disposition    ED Disposition Condition Comment   Admit  Hospital Area: [facility] [[id number]]  Level of Care: Telemetry Medical [104]  Diagnosis: CVA (cerebral vascular accident) [facility]) [[id number]]  Admitting Physician: Karmen Bongo [[id number]]  Attending Physician: Karmen Bongo [[id number]]  Estimated length of stay: 3 - 4 days  Certification:: I certify this patient will need inpatient services for at least 2 midnights  PT Class (Do Not Modify): Inpatient [101]  PT Acc Code (Do Not Modify): Private [1]       B Medical/Surgery History Past Medical History:  Diagnosis Date  . Arthritis   . Atrial fibrillation ([place])   . CHF (congestive heart failure), NYHA class III (HCC)    s/p heart transplant  . Chronic kidney disease    end stage  . Chronic systolic dysfunction of left ventricle   . CVA (cerebral infarction)   . Diabetes mellitus    PMH; Prior to heart transplant  . Family history of coronary  artery disease    in both parents  . Hyperlipidemia   . Hypertension   . Left bundle branch block   . Morbid obesity ([place])    status post lap band  . Myocardial infarction [facility])    prior to heart transplant  . Nonischemic cardiomyopathy ([place])    prior to heart transplant  . Obesity (BMI 30-39.9)   . Obstructive sleep apnea    no longer needs CPAP after heart transplant per pt  . Premature ventricular contractions   . Prostate cancer ([place])   . SOB (shortness of breath)    Past Surgical History:  Procedure Laterality Date  . CARDIAC PACEMAKER PLACEMENT  09/21/2009   Biventricular implantable cardioverter-defibrillator implantation     . COLONOSCOPY W/ BIOPSIES AND POLYPECTOMY    . FOOT SURGERY     left  . HEART TRANSPLANT  2014  . LAPAROSCOPIC GASTRIC BANDING  01/27/2007  . LEFT VENTRICULAR ASSIST DEVICE     implanted at [facility]  . PROSTATE BIOPSY    . TOTAL HIP ARTHROPLASTY Right 07/22/2017   Procedure: RIGHT TOTAL HIP ARTHROPLASTY ANTERIOR APPROACH;  Surgeon: Rod Can, MD;  Location: [facility];  Service: Orthopedics;  Laterality: Right;  Needs RNFA  . TOTAL KNEE ARTHROPLASTY Right 07/22/2017     A IV Location/Drains/Wounds Patient Lines/Drains/Airways Status   Active Line/Drains/Airways    Name:   Placement date:   Placement time:   Site:   Days:   Peripheral  IV 11/09/18 Left Hand   11/09/18    0743    Hand   less than 1   Fistula / Graft Left Forearm   -    -    Forearm      Fistula / Graft Right Upper arm Arteriovenous fistula   -    -    Upper arm      Incision (Closed) 07/22/17 Hip Right   07/22/17    1049     475          Intake/Output Last 24 hours No intake or output data in the 24 hours ending 11/09/18 1115  Labs/Imaging Results for orders placed or performed during the hospital encounter of 11/09/18 (from the past 48 hour(s))  CBC with Differential/Platelet     Status: Abnormal   Collection Time: 11/09/18  7:45 AM  Result Value Ref Range   WBC 7.1 4.0  - 10.5 K/uL   RBC 3.29 (L) 4.22 - 5.81 MIL/uL   Hemoglobin 9.9 (L) 13.0 - 17.0 g/dL   HCT 30.4 (L) 39.0 - 52.0 %   MCV 92.4 80.0 - 100.0 fL   MCH 30.1 26.0 - 34.0 pg   MCHC 32.6 30.0 - 36.0 g/dL   RDW 14.3 11.5 - 15.5 %   Platelets 205 150 - 400 K/uL   nRBC 0.0 0.0 - 0.2 %   Neutrophils Relative % 82 %   Neutro Abs 5.9 1.7 - 7.7 K/uL   Lymphocytes Relative 8 %   Lymphs Abs 0.6 (L) 0.7 - 4.0 K/uL   Monocytes Relative 7 %   Monocytes Absolute 0.5 0.1 - 1.0 K/uL   Eosinophils Relative 1 %   Eosinophils Absolute 0.0 0.0 - 0.5 K/uL   Basophils Relative 1 %   Basophils Absolute 0.1 0.0 - 0.1 K/uL   Immature Granulocytes 1 %   Abs Immature Granulocytes 0.08 (H) 0.00 - 0.07 K/uL    Comment: Performed at [facility], [address], [place], [place] [address]  Comprehensive metabolic panel     Status: Abnormal   Collection Time: 11/09/18  7:45 AM  Result Value Ref Range   Sodium 138 135 - 145 mmol/L   Potassium 4.8 3.5 - 5.1 mmol/L   Chloride 94 (L) 98 - 111 mmol/L   CO2 27 22 - 32 mmol/L   Glucose, Bld 100 (H) 70 - 99 mg/dL   BUN 33 (H) 6 - 20 mg/dL   Creatinine, Ser 9.97 (H) 0.61 - 1.24 mg/dL   Calcium 9.7 8.9 - 10.3 mg/dL   Total Protein 6.6 6.5 - 8.1 g/dL   Albumin 3.4 (L) 3.5 - 5.0 g/dL   AST 23 15 - 41 U/L   ALT 15 0 - 44 U/L   Alkaline Phosphatase 67 38 - 126 U/L   Total Bilirubin 0.9 0.3 - 1.2 mg/dL   GFR calc non Af Amer 5 (L) >60 mL/min   GFR calc Af Amer 6 (L) >60 mL/min   Anion gap 17 (H) 5 - 15    Comment: Performed at [facility], [place] [address] [place], [place] [address]  Ammonia     Status: Abnormal   Collection Time: 11/09/18  7:45 AM  Result Value Ref Range   Ammonia <9 (L) 9 - 35 umol/L    Comment: Performed at [facility], [place] [address], [place], [place] [address]  Salicylate level     Status: None   Collection Time: 11/09/18  7:45 AM  Result Value Ref Range   Salicylate Lvl <9.2 2.8 - 30.0 mg/dL    Comment: Performed at [place] [address] [place], [place] [address]  Acetaminophen level     Status: Abnormal   Collection Time: 11/09/18  7:45 AM  Result Value Ref Range   Acetaminophen (Tylenol), Serum <10 (L) 10 - 30 ug/mL    Comment: (NOTE) Therapeutic concentrations vary significantly. A range of 10-30 ug/mL  may be an effective concentration for many patients. However, some  are best treated at concentrations outside of this range. Acetaminophen concentrations >150 ug/mL at 4 hours after ingestion  and >50 ug/mL at 12 hours after ingestion are often associated with  toxic reactions. Performed at [facility], [place] [address] [place], [place] [address]   Lactic acid, plasma     Status: None   Collection Time: 11/09/18  7:47 AM  Result Value Ref Range   Lactic Acid, Venous 0.9 0.5 - 1.9 mmol/L    Comment: Performed at [facility] [address], [place], [place] [address]  I-stat troponin, ED     Status: None   Collection Time: 11/09/18  7:50 AM  Result Value Ref Range   Troponin i, poc 0.01 0.00 - 0.08 ng/mL   Comment 3            Comment: Due to the release kinetics of cTnI, a negative result within the first hours of the onset of symptoms does not rule out myocardial infarction with certainty. If myocardial infarction is still suspected, repeat the test at appropriate intervals.    Dg Chest 2 View  Result Date: 11/09/2018 CLINICAL DATA:  Mental status changes.  Slurred speech. EXAM: CHEST - 2 VIEW COMPARISON:  10/21/2018 FINDINGS: Previous median sternotomy. Chronic cardiomegaly. Pulmonary venous hypertension. Pulmonary arteries prominent consistent pulmonary arterial hypertension. No frank edema. Mild chronic volume loss left base. No infiltrate, new collapse or effusion. Lead fragment again projects over the left heart. IMPRESSION: Cardiomegaly. Pulmonary arterial and pulmonary venous hypertension. No frank edema. No consolidation or new collapse. Mild chronic volume  loss left base. Electronically Signed   By: Nelson Chimes M.D.   On: 11/09/2018 08:22   Ct Head Wo Contrast  Result Date: 11/09/2018 CLINICAL DATA:  58 year old male with acute LEFT sided weakness for 1 week. EXAM: CT HEAD WITHOUT CONTRAST TECHNIQUE: Contiguous axial images were obtained from the base of the skull through the vertex without intravenous contrast. COMPARISON:  05/15/2012 CT and 09/06/2008 MR FINDINGS: Brain: A small to moderate RIGHT frontoparietal infarct is noted without hemorrhage. Mild chronic small-vessel white matter ischemic changes noted. No extra-axial collection, mass lesion, midline shift or hydrocephalus. Vascular: No hyperdense vessel or unexpected calcification. Skull: Normal. Negative for fracture or focal lesion. Sinuses/Orbits: No acute finding. Other: None. IMPRESSION: 1. RIGHT frontoparietal infarct without hemorrhage, likely subacute given history. 2. Mild chronic small-vessel white matter ischemic changes. Electronically Signed   By: Margarette Canada M.D.   On: 11/09/2018 08:11    Pending Labs Unresulted Labs (From admission, onward)   None      Vitals/Pain Today's Vitals   11/09/18 0730 11/09/18 0740 11/09/18 0900 11/09/18 1035  BP: 130/82  117/80 130/84  Pulse: 72  67 69  Resp: 11  13 12   Temp:      TempSrc:      SpO2: 93%  91% 97%  PainSc:  6       Isolation Precautions No  active isolations  Medications Medications - No data to display  Mobility walks with device Low fall risk   Focused Assessments Neuro Assessment Handoff:  Swallow screen pass? Yes          Neuro Assessment: Exceptions to WDL Neuro Checks:      Last Documented NIHSS Modified Score:   Has TPA been given? No If patient is a Neuro Trauma and patient is going to OR before floor call report to [place] nurse: [phone] or [phone]     R Recommendations: See Admitting Provider Note  Report given to:   Additional Notes:

## 2018-11-09 NOTE — ED Triage Notes (Signed)
Patient reports that for the past 8 days he has had weakness on his left side as well as feeling like his speech sounds different and the L side of his face feels odd. Patient is a/ox4 but speech is mildly slurred, L sided facial droop present. Grip strength decreased in L hand. Patient had full dialysis session yesterday.

## 2018-11-10 ENCOUNTER — Encounter (HOSPITAL_COMMUNITY): Payer: Medicare Other

## 2018-11-10 DIAGNOSIS — G92 Toxic encephalopathy: Principal | ICD-10-CM

## 2018-11-10 LAB — CBC
HEMATOCRIT: 29.4 % — AB (ref 39.0–52.0)
Hemoglobin: 9.6 g/dL — ABNORMAL LOW (ref 13.0–17.0)
MCH: 30 pg (ref 26.0–34.0)
MCHC: 32.7 g/dL (ref 30.0–36.0)
MCV: 91.9 fL (ref 80.0–100.0)
Platelets: 204 10*3/uL (ref 150–400)
RBC: 3.2 MIL/uL — ABNORMAL LOW (ref 4.22–5.81)
RDW: 14.3 % (ref 11.5–15.5)
WBC: 7.1 10*3/uL (ref 4.0–10.5)
nRBC: 0 % (ref 0.0–0.2)

## 2018-11-10 LAB — GLUCOSE, CAPILLARY: Glucose-Capillary: 95 mg/dL (ref 70–99)

## 2018-11-10 LAB — RENAL FUNCTION PANEL
Albumin: 3.2 g/dL — ABNORMAL LOW (ref 3.5–5.0)
Anion gap: 19 — ABNORMAL HIGH (ref 5–15)
BUN: 45 mg/dL — ABNORMAL HIGH (ref 6–20)
CO2: 24 mmol/L (ref 22–32)
Calcium: 9.2 mg/dL (ref 8.9–10.3)
Chloride: 93 mmol/L — ABNORMAL LOW (ref 98–111)
Creatinine, Ser: 12.24 mg/dL — ABNORMAL HIGH (ref 0.61–1.24)
GFR calc Af Amer: 5 mL/min — ABNORMAL LOW (ref >60)
GFR calc non Af Amer: 4 mL/min — ABNORMAL LOW (ref >60)
Glucose, Bld: 90 mg/dL (ref 70–99)
Phosphorus: 8 mg/dL — ABNORMAL HIGH (ref 2.5–4.6)
Potassium: 4.7 mmol/L (ref 3.5–5.1)
Sodium: 136 mmol/L (ref 135–145)

## 2018-11-10 MED ORDER — DOXERCALCIFEROL 4 MCG/2ML IV SOLN
5.0000 ug | INTRAVENOUS | Status: DC
  Administered 2018-11-10: 5 ug via INTRAVENOUS

## 2018-11-10 MED ORDER — PENTAFLUOROPROP-TETRAFLUOROETH EX AERO: 1.0000 "application " | INHALATION_SPRAY | CUTANEOUS | Status: DC | PRN

## 2018-11-10 MED ORDER — SODIUM CHLORIDE 0.9 % IV SOLN: 100.0000 mL | INTRAVENOUS | Status: DC | PRN

## 2018-11-10 MED ORDER — RENA-VITE PO TABS
1.0000 | ORAL_TABLET | Freq: Every day | ORAL | Status: DC
  Administered 2018-11-10: 1 via ORAL
  Filled 2018-11-10: qty 1

## 2018-11-10 MED ORDER — DOXERCALCIFEROL 4 MCG/2ML IV SOLN
INTRAVENOUS | Status: AC
  Filled 2018-11-10: qty 4

## 2018-11-10 MED ORDER — SEVELAMER CARBONATE 800 MG PO TABS
1600.0000 mg | ORAL_TABLET | Freq: Three times a day (TID) | ORAL | Status: DC
  Administered 2018-11-10 – 2018-11-11 (×2): 1600 mg via ORAL
  Filled 2018-11-10 (×2): qty 2

## 2018-11-10 MED ORDER — CHLORHEXIDINE GLUCONATE CLOTH 2 % EX PADS
6.0000 | MEDICATED_PAD | Freq: Every day | CUTANEOUS | Status: DC
  Administered 2018-11-10: 6 via TOPICAL

## 2018-11-10 MED ORDER — PRAVASTATIN SODIUM 10 MG PO TABS
20.0000 mg | ORAL_TABLET | Freq: Every day | ORAL | Status: DC
  Administered 2018-11-10: 20 mg via ORAL
  Filled 2018-11-10: qty 2

## 2018-11-10 NOTE — Evaluation (Signed)
Occupational Therapy Evaluation Patient Details Name: [name] MRN: [id number] DOB: Aug 14, 1961 Today's Date: 11/10/2018    History of Present Illness Pt is a 58 y/o male with PMH of CHF s/p heart transplant on immunosuppression, a fib, R sided CVA with residual L sided weakness, diabetes, HTN, ESRD on hemodialysis brough to hospital due to confusion and emotional lability. CT reveals chronic MCA infarction, MRI reveals no acute changes. Workup for toxic metabolic encepalopathy per neurology.    Clinical Impression   PTA patient independent with self care, IADLs, med mgmt and driving.  Admitted for above and limited by problem list below, including cognition, safety, balance, and decreased activity tolerance.  He is able to follow 1 step commands, but limited with attention to task, sequencing, problem solving, and short term memory.  He requires min guard for grooming at sink, min assist for LB ADLs, and min assist for basic transfers (increased time and effort with multiple attempts to transition to standing initially).  Patient will benefit from continued OT services while admitted and after dc at [facility] level in order to optimize safety and independence with ADLs/mobility.  At this time recommend 24/7 assistance at dc, if unable to have 24/7 he may require SNF.     Follow Up Recommendations  Supervision/Assistance - 24 hour;Home health OT    Equipment Recommendations  3 in 1 bedside commode    Recommendations for Other Services       Precautions / Restrictions Restrictions Weight Bearing Restrictions: No      Mobility Bed Mobility Overal bed mobility: Needs Assistance Bed Mobility: Supine to Sit     Supine to sit: Supervision     General bed mobility comments: no assist required, supervision for safety  Transfers Overall transfer level: Needs assistance Equipment used: None Transfers: Sit to/from Stand Sit to Stand: Min assist         General transfer comment:  increased time and effort, multiple attempts required to ascend into standing; min assist for physical support and inital standing balance     Balance Overall balance assessment: Needs assistance Sitting-balance support: No upper extremity supported;Feet supported Sitting balance-Leahy Scale: Fair     Standing balance support: No upper extremity supported;During functional activity Standing balance-Leahy Scale: Poor Standing balance comment: reaching out for items to support self during mobility, min guard for safety                            ADL either performed or assessed with clinical judgement   ADL Overall ADL's : Needs assistance/impaired     Grooming: Wash/dry hands;Oral care;Min guard;Standing   Upper Body Bathing: Set up;Sitting   Lower Body Bathing: Minimal assistance;Sit to/from stand   Upper Body Dressing : Set up;Sitting   Lower Body Dressing: Minimal assistance;Sit to/from stand   Toilet Transfer: Minimal assistance;Ambulation Toilet Transfer Details (indicate cue type and reason): simulated to recliner          Functional mobility during ADLs: Cueing for safety;Min guard General ADL Comments: pt requires increased time and effort to ascend into standing, unsteady but once standing able to complete basic self care tasks with min guard assist; limited by cognition      Vision Patient Visual Report: No change from baseline(reports need to return to eye doctor) Vision Assessment?: No apparent visual deficits     Perception     Praxis      Pertinent Vitals/Pain Pain Assessment: No/denies pain  Hand Dominance Right   Extremity/Trunk Assessment Upper Extremity Assessment Upper Extremity Assessment: Overall WFL for tasks assessed   Lower Extremity Assessment Lower Extremity Assessment: Defer to PT evaluation       Communication Communication Communication: No difficulties   Cognition Arousal/Alertness: Awake/alert Behavior During  Therapy: WFL for tasks assessed/performed Overall Cognitive Status: Impaired/Different from baseline Area of Impairment: Attention;Memory;Following commands;Safety/judgement;Awareness;Problem solving                   Current Attention Level: Sustained Memory: Decreased short-term memory;Decreased recall of precautions Following Commands: Follows one step commands consistently;Follows one step commands with increased time Safety/Judgement: Decreased awareness of safety;Decreased awareness of deficits Awareness: Intellectual Problem Solving: Slow processing;Difficulty sequencing;Decreased initiation;Requires verbal cues General Comments: Short blessed test completed: scored 12/28 (impairments noted in short term memory, sequencing, and attention).  Spouse reports notable changes in comprehension. Able to follow 1 step commands during basic self care tasks with minimal cueing.   General Comments  spouse present and supportive    Exercises     Shoulder Instructions      Home Living Family/patient expects to be discharged to:: Private residence Living Arrangements: Spouse/significant other Available Help at Discharge: Family;Available PRN/intermittently(spouse works during daytime) Type of Home: House Home Access: Stairs to enter [employer] of Steps: 5 Entrance Stairs-Rails: Right Home Layout: One level     Bathroom Shower/Tub: Teacher, early years/pre: Handicapped height     Home Equipment: Environmental consultant - 2 wheels;Cane - single point;Bedside commode      Lives With: Spouse    Prior Functioning/Environment Level of Independence: Independent        Comments: sometimes using cane if neuropathy is flared up, completes IADLs, manages medications, drives         OT Problem List: Impaired balance (sitting and/or standing);Decreased cognition;Decreased safety awareness;Decreased knowledge of use of DME or AE;Decreased knowledge of precautions      OT  Treatment/Interventions: Self-care/ADL training;DME and/or AE instruction;Therapeutic activities;Cognitive remediation/compensation;Patient/family education;Balance training    OT Goals(Current goals can be found in the care plan section) Acute Rehab OT Goals Patient Stated Goal: to get home OT Goal Formulation: With patient Time For Goal Achievement: 11/24/18 Potential to Achieve Goals: Good  OT Frequency: Min 2X/week   Barriers to D/C:            Co-evaluation              AM-PAC OT "6 Clicks" Daily Activity     Outcome Measure Help from another person eating meals?: None Help from another person taking care of personal grooming?: A Little Help from another person toileting, which includes using toliet, bedpan, or urinal?: A Little Help from another person bathing (including washing, rinsing, drying)?: A Little Help from another person to put on and taking off regular upper body clothing?: None Help from another person to put on and taking off regular lower body clothing?: A Little 6 Click Score: 20   End of Session Equipment Utilized During Treatment: Gait belt Nurse Communication: Mobility status;Precautions  Activity Tolerance: Patient tolerated treatment well Patient left: in chair;with call bell/phone within reach;with family/visitor present  OT Visit Diagnosis: Other abnormalities of gait and mobility (R26.89);Other symptoms and signs involving cognitive function                Time: 7353-2992 OT Time Calculation (min): 24 min Charges:  OT General Charges $OT Visit: 1 Visit OT Evaluation $OT Eval Moderate Complexity: 1 Mod OT Treatments $Self  Care/Home Management : 8-22 mins  Delight Stare, [place] Acute Rehabilitation Services Pager [phone] Office [phone]   Delight Stare 11/10/2018, 1:21 PM

## 2018-11-10 NOTE — Consult Note (Addendum)
[facility] Renal Consultation Note    Indication for Consultation:  Management of ESRD/hemodialysis; anemia, hypertension/volume and secondary hyperparathyroidism PCP:  HPI: [name] is a 58 y.o. male with ESRD on HD MWF at [facility]. PMH significant for AKI following heart transplant for NICM in 2014. Progressed to ESRD, DM Type 2, HTN, HLD, prior CVA, prostate ca, morbid obesity s/p gastric lap band.   Admitted with AMS. Presented to ED with weakness and slurred speech. CT head with subacute right frontoparietal infarct. MRI showed No acute intracranial abnormality. Chronic right MCA perirolandic encephalomalacia related to a 2010 infarct. EF 60-65% on Echo.  Labs: Na 138, K 4.8, CO2 27, BUN 33 Cr 9.9, WBC 7.1, Hgb 9.9, Plts 205.  Seen by neuro - No further stroke workup. Concern for overmedicating with sedating medications. Recently added Cymbalta.   Seen and examined at bedside. Alert and oriented x3.  Wife reports that he has not been himself for last few days, more confused, unable to complete tasks he usually does. Denies fevers,chills, CP, SOB, N/V/D.   Due for routine dialysis today. Has been compliant with treatments with no reported issues.   Past Medical History:  Diagnosis Date  . Arthritis   . Atrial fibrillation ([place])   . CHF (congestive heart failure), NYHA class III (HCC)    s/p heart transplant  . Chronic kidney disease    end stage  . Chronic systolic dysfunction of left ventricle   . CVA (cerebral infarction)   . Diabetes mellitus    PMH; Prior to heart transplant  . Family history of coronary artery disease    in both parents  . Hyperlipidemia   . Hypertension   . Left bundle branch block   . Morbid obesity ([place])    status post lap band  . Myocardial infarction [facility])    prior to heart transplant  . Nonischemic cardiomyopathy ([place])    prior to heart transplant  . Obesity (BMI 30-39.9)   . Obstructive sleep apnea     no longer needs CPAP after heart transplant per pt  . Premature ventricular contractions   . Prostate cancer ([place])   . SOB (shortness of breath)    Past Surgical History:  Procedure Laterality Date  . CARDIAC PACEMAKER PLACEMENT  09/21/2009   Biventricular implantable cardioverter-defibrillator implantation     . COLONOSCOPY W/ BIOPSIES AND POLYPECTOMY    . FOOT SURGERY     left  . HEART TRANSPLANT  2014  . LAPAROSCOPIC GASTRIC BANDING  01/27/2007  . LEFT VENTRICULAR ASSIST DEVICE     implanted at [facility]  . PROSTATE BIOPSY    . TOTAL HIP ARTHROPLASTY Right 07/22/2017   Procedure: RIGHT TOTAL HIP ARTHROPLASTY ANTERIOR APPROACH;  Surgeon: Rod Can, MD;  Location: [place];  Service: Orthopedics;  Laterality: Right;  Needs RNFA  . TOTAL KNEE ARTHROPLASTY Right 07/22/2017   Family History  Problem Relation Age of Onset  . Coronary artery disease Father        had, PTCA & CABG  . Heart attack Father   . Hypertension Father   . Diabetes Father   . Prostate cancer Father   . Coronary artery disease Mother   . Heart attack Mother   . Hypertension Mother   . Stroke Mother   . Lupus Mother   . Prostate cancer Brother   . Prostate cancer Paternal Uncle    Social History:  reports that he has never smoked. He has quit  using smokeless tobacco.  His smokeless tobacco use included snuff. He reports that he does not drink alcohol or use drugs. Allergies  Allergen Reactions  . Heparin Nausea Only, Swelling and Other (See Comments)     * * HIT * * SWELLING REACTION UNSPECIFIED  DIAPHORESIS  . Penicillin G Potassium [Penicillin G] Nausea Only and Other (See Comments)    DIAPHORESIS   Prior to Admission medications   Medication Sig Start Date End Date Taking? Authorizing Provider  aspirin 81 MG chewable tablet Chew 1 tablet (81 mg total) by mouth 2 (two) times daily. Patient taking differently: Chew 81 mg by mouth daily.  07/23/17  Yes Swinteck, Aaron Edelman, MD  B Complex-C-Folic Acid  (RENO CAPS) 1 MG CAPS Take 1 capsule by mouth daily. 08/09/17  Yes [provider]  cycloSPORINE modified (NEORAL) 100 MG capsule Take 175 mg by mouth 2 (two) times daily. Take one capsule (100 mg) by mouth twice daily with 3 capsules (75 mg) for a total dose of 175 mg twice daily (both modified)    Yes [provider]  cycloSPORINE modified (NEORAL) 25 MG capsule Take 75 mg by mouth 2 (two) times daily. Take with 100 mg tablet to equal a total of 175 mg 08/05/18 08/05/19 Yes [provider]  DULoxetine (CYMBALTA) 30 MG capsule Take 30 mg by mouth daily.  10/01/18  Yes [provider]  HYDROcodone-acetaminophen (NORCO/VICODIN) 5-325 MG tablet Take 1 tablet by mouth every 6 (six) hours as needed for moderate pain.    Yes [provider]  lidocaine (XYLOCAINE) 5 % ointment Apply 1 application topically as needed. Apply to feet twice daily as needed. 01/03/17  Yes Patel, Donika K, DO  lidocaine-prilocaine (EMLA) cream Apply 1 application topically as needed (port).  12/09/17  Yes [provider]  Melatonin 10 MG TABS Take 10 mg by mouth as needed (sleep).   Yes [provider]  mycophenolate (MYFORTIC) 360 MG TBEC EC tablet Take 720 mg by mouth 2 (two) times daily.   Yes [provider]  omeprazole (PRILOSEC) 20 MG capsule Take 20 mg by mouth daily.  04/06/16  Yes [provider]  ondansetron (ZOFRAN) 4 MG tablet Take 1 tablet (4 mg total) by mouth every 6 (six) hours as needed for nausea. Patient taking differently: Take 4 mg by mouth daily.  07/23/17  Yes Swinteck, Aaron Edelman, MD  oxyCODONE (OXY IR/ROXICODONE) 5 MG immediate release tablet Take 1-2 tablets (5-10 mg total) by mouth every 6 (six) hours as needed for breakthrough pain. 07/23/17  Yes Swinteck, Aaron Edelman, MD  pravastatin (PRAVACHOL) 40 MG tablet Take 1 tablet (40 mg total) by mouth daily at 6 PM. Patient taking differently: Take 20 mg by mouth at bedtime.  07/24/17  Yes  Swinteck, Aaron Edelman, MD  senna (SENOKOT) 8.6 MG TABS tablet Take 2 tablets (17.2 mg total) by mouth at bedtime. Patient taking differently: Take 2 tablets by mouth daily as needed for mild constipation.  07/23/17  Yes Swinteck, Aaron Edelman, MD  tadalafil (CIALIS) 20 MG tablet Take 20 mg by mouth daily as needed for erectile dysfunction.   Yes [provider]  cinacalcet (SENSIPAR) 60 MG tablet Take 60 mg by mouth daily.     [provider]  Cyanocobalamin (NASCOBAL) 500 MCG/0.1ML SOLN Place 500 mcg into the nose daily as needed (energy boost).    [provider]   Current Facility-Administered Medications  Medication Dose Route Frequency Provider Last Rate Last Dose  . 0.9 %  sodium chloride infusion  100 mL Intravenous PRN Lynnda Child, PA-C      . acetaminophen (TYLENOL) tablet 650 mg  650 mg Oral Q6H PRN Karmen Bongo, MD       Or  . acetaminophen (TYLENOL) suppository 650 mg  650 mg Rectal Q6H PRN Karmen Bongo, MD      . aspirin chewable tablet 81 mg  81 mg Oral Daily Karmen Bongo, MD   81 mg at 11/10/18 1020  . calcium carbonate (dosed in mg elemental calcium) suspension 500 mg of elemental calcium  500 mg of elemental calcium Oral Q6H PRN Karmen Bongo, MD      . camphor-menthol [facility]) lotion 1 application  1 application Topical V6H PRN Karmen Bongo, MD       And  . hydrOXYzine (ATARAX/VISTARIL) tablet 25 mg  25 mg Oral Q8H PRN Karmen Bongo, MD      . Chlorhexidine Gluconate Cloth 2 % PADS 6 each  6 each Topical Q0600 Lynnda Child, PA-C   6 each at 11/10/18 1030  . cinacalcet (SENSIPAR) tablet 60 mg  60 mg Oral Q breakfast Karmen Bongo, MD   60 mg at 11/10/18 0800  . cycloSPORINE modified (NEORAL) capsule 100 mg  100 mg Oral BID Karmen Bongo, MD   100 mg at 11/10/18 1019   And  . cycloSPORINE modified (NEORAL) capsule 75 mg  75 mg Oral BID Karmen Bongo, MD   75 mg at 11/10/18 1019  . docusate sodium (ENEMEEZ) enema 283 mg  1  enema Rectal PRN Karmen Bongo, MD      . feeding supplement (NEPRO CARB STEADY) liquid 237 mL  237 mL Oral TID PRN Karmen Bongo, MD      . HYDROcodone-acetaminophen (NORCO/VICODIN) 5-325 MG per tablet 1 tablet  1 tablet Oral Q6H PRN Karmen Bongo, MD   1 tablet at 11/09/18 2111  . mycophenolate (MYFORTIC) EC tablet 720 mg  720 mg Oral BID Karmen Bongo, MD   720 mg at 11/10/18 1019  . ondansetron (ZOFRAN) tablet 4 mg  4 mg Oral Q6H PRN Karmen Bongo, MD       Or  . ondansetron [facility]) injection 4 mg  4 mg Intravenous Q6H PRN Karmen Bongo, MD      . oxyCODONE (Oxy IR/ROXICODONE) immediate release tablet 5-10 mg  5-10 mg Oral Q6H PRN Karmen Bongo, MD      . pantoprazole (PROTONIX) EC tablet 40 mg  40 mg Oral Daily Karmen Bongo, MD   40 mg at 11/10/18 1020  . pentafluoroprop-tetrafluoroeth (GEBAUERS) aerosol 1 application  1 application Topical PRN Lynnda Child, PA-C      . pravastatin (PRAVACHOL) tablet 20 mg  20 mg Oral q1800 Karmen Bongo, MD   20 mg at 11/09/18 1602  . senna (SENOKOT) tablet 17.2 mg  2 tablet Oral Daily PRN Karmen Bongo, MD      . sorbitol 70 % solution 30 mL  30 mL Oral PRN Karmen Bongo, MD      . zolpidem Lorrin Mais) tablet 5 mg  5 mg Oral QHS PRN Karmen Bongo, MD   5 mg at 11/10/18 0213     ROS: As per HPI otherwise negative.  Physical Exam: Vitals:   11/10/18 0023 11/10/18 0420 11/10/18 0844 11/10/18 1141  BP: 139/75 137/67 105/68 124/76  Pulse:  73 67 70  Resp: 17 18 16 16   Temp: (!) 97.4 F (36.3 C) 97.9 F (36.6 C) 98.2 F (36.8 C) 97.9 F (36.6  C)  TempSrc: Oral Oral Oral Oral  SpO2: 97% 100% 97% 100%  Weight:      Height:         General: Obese male NAD  Head: NCAT sclera not icteric MMM Neck: Supple. No JVD  Lungs: CTA bilaterally without wheezes, rales, or rhonchi. Breathing is unlabored. Heart: RRR with S1 S2 Abdomen: soft NT + BS Lower extremities:without edema or ischemic changes, no open wounds  Neuro: A  & O  X 3. Moves all extremities spontaneously. Dysarthric speech  Psych:  Responds to questions appropriately with a normal affect. Dialysis Access: R forearm AVF +bruit   Labs: Basic Metabolic Panel: Recent Labs  Lab 11/09/18 0745  NA 138  K 4.8  CL 94*  CO2 27  GLUCOSE 100*  BUN 33*  CREATININE 9.97*  CALCIUM 9.7   Liver Function Tests: Recent Labs  Lab 11/09/18 0745  AST 23  ALT 15  ALKPHOS 67  BILITOT 0.9  PROT 6.6  ALBUMIN 3.4*   No results for input(s): LIPASE, AMYLASE in the last 168 hours. Recent Labs  Lab 11/09/18 0745  AMMONIA <9*   CBC: Recent Labs  Lab 11/09/18 0745  WBC 7.1  NEUTROABS 5.9  HGB 9.9*  HCT 30.4*  MCV 92.4  PLT 205   Cardiac Enzymes: No results for input(s): CKTOTAL, CKMB, CKMBINDEX, TROPONINI in the last 168 hours. CBG: Recent Labs  Lab 11/10/18 1136  GLUCAP 95   Iron Studies: No results for input(s): IRON, TIBC, TRANSFERRIN, FERRITIN in the last 72 hours. Studies/Results: Dg Chest 2 View  Result Date: 11/09/2018 CLINICAL DATA:  Mental status changes.  Slurred speech. EXAM: CHEST - 2 VIEW COMPARISON:  10/21/2018 FINDINGS: Previous median sternotomy. Chronic cardiomegaly. Pulmonary venous hypertension. Pulmonary arteries prominent consistent pulmonary arterial hypertension. No frank edema. Mild chronic volume loss left base. No infiltrate, new collapse or effusion. Lead fragment again projects over the left heart. IMPRESSION: Cardiomegaly. Pulmonary arterial and pulmonary venous hypertension. No frank edema. No consolidation or new collapse. Mild chronic volume loss left base. Electronically Signed   By: Nelson Chimes M.D.   On: 11/09/2018 08:22   Ct Head Wo Contrast  Result Date: 11/09/2018 CLINICAL DATA:  58 year old male with acute LEFT sided weakness for 1 week. EXAM: CT HEAD WITHOUT CONTRAST TECHNIQUE: Contiguous axial images were obtained from the base of the skull through the vertex without intravenous contrast. COMPARISON:   05/15/2012 CT and 09/06/2008 MR FINDINGS: Brain: A small to moderate RIGHT frontoparietal infarct is noted without hemorrhage. Mild chronic small-vessel white matter ischemic changes noted. No extra-axial collection, mass lesion, midline shift or hydrocephalus. Vascular: No hyperdense vessel or unexpected calcification. Skull: Normal. Negative for fracture or focal lesion. Sinuses/Orbits: No acute finding. Other: None. IMPRESSION: 1. RIGHT frontoparietal infarct without hemorrhage, likely subacute given history. 2. Mild chronic small-vessel white matter ischemic changes. Electronically Signed   By: Margarette Canada M.D.   On: 11/09/2018 08:11   Mr Brain Wo Contrast  Result Date: 11/09/2018 CLINICAL DATA:  58 year old male presenting this morning with left side weakness x1 week and evidence of right MCA cortical infarct on plain CT. EXAM: MRI HEAD WITHOUT CONTRAST MRA HEAD WITHOUT CONTRAST TECHNIQUE: Multiplanar, multiecho pulse sequences of the brain and surrounding structures were obtained without intravenous contrast. Angiographic images of the head were obtained using MRA technique without contrast. COMPARISON:  Head CT earlier today. Brain MRI and intracranial MRA 06/27/2009. FINDINGS: MRI HEAD FINDINGS Brain: Signal changes in the right lateral perirolandic cortex  corresponding to the CT finding earlier today are chronic (series 6, image 38), and appear to correspond to an infarct which was acute on the 2010 MRI. No restricted diffusion or evidence of acute infarction. Aside from the right MCA territory encephalomalacia there are occasional chronic micro hemorrhages (right middle frontal gyrus and left occipital pole). There is mild Wallerian degeneration evident in the right brainstem. Pearline Cables and white matter signal is otherwise within normal limits. Generalized cerebral volume loss since 2010. No midline shift, mass effect, evidence of mass lesion, ventriculomegaly, extra-axial collection or acute intracranial  hemorrhage. Cervicomedullary junction and pituitary are within normal limits. Vascular: Major intracranial vascular flow voids are stable. Skull and upper cervical spine: Degenerative appearing cervical spinal stenosis plus chronic nonspecific decreased T1 marrow signal in the cervical spine. Skull marrow signal is normal. Sinuses/Orbits: Negative. Other: Mastoids are clear. Visible internal auditory structures appear normal. Scalp and face soft tissues appear negative. MRA HEAD FINDINGS Chronic tortuosity of the distal vertebral arteries with stable antegrade flow in the posterior circulation. Patent PICA origins. No distal vertebral stenosis. Patent vertebrobasilar junction and basilar artery without stenosis. Motion artifact at the tip of the basilar. Bilateral SCA and PCA origins appear stable and within normal limits. Both posterior communicating arteries are present. Bilateral PCA branches appear within normal limits. Stable antegrade flow in both ICA siphons. Motion artifact at the supraclinoid segments. No siphon stenosis. Ophthalmic and posterior communicating artery origins appear stable and normal. Patent carotid termini. Normal MCA and ACA origins. Anterior communicating artery and visible ACA branches are within normal limits. MCA M1 segments and visible bilateral MCA branches appears stable and within normal limits. IMPRESSION: 1.  No acute intracranial abnormality. Chronic right MCA perirolandic encephalomalacia related to a 2010 infarct. Occasional chronic microhemorrhage otherwise. 2. 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 Electronically Signed   By: Genevie Ann M.D.   On: 11/09/2018 14:44   Mr Virgel Paling KX Contrast  Result Date: 11/09/2018 CLINICAL DATA:  58 year old male presenting this morning with left side weakness x1 week and evidence of right MCA cortical infarct on plain CT. EXAM: MRI HEAD WITHOUT CONTRAST MRA HEAD WITHOUT CONTRAST TECHNIQUE: Multiplanar, multiecho pulse  sequences of the brain and surrounding structures were obtained without intravenous contrast. Angiographic images of the head were obtained using MRA technique without contrast. COMPARISON:  Head CT earlier today. Brain MRI and intracranial MRA 06/27/2009. FINDINGS: MRI HEAD FINDINGS Brain: Signal changes in the right lateral perirolandic cortex corresponding to the CT finding earlier today are chronic (series 6, image 38), and appear to correspond to an infarct which was acute on the 2010 MRI. No restricted diffusion or evidence of acute infarction. Aside from the right MCA territory encephalomalacia there are occasional chronic micro hemorrhages (right middle frontal gyrus and left occipital pole). There is mild Wallerian degeneration evident in the right brainstem. Pearline Cables and white matter signal is otherwise within normal limits. Generalized cerebral volume loss since 2010. No midline shift, mass effect, evidence of mass lesion, ventriculomegaly, extra-axial collection or acute intracranial hemorrhage. Cervicomedullary junction and pituitary are within normal limits. Vascular: Major intracranial vascular flow voids are stable. Skull and upper cervical spine: Degenerative appearing cervical spinal stenosis plus chronic nonspecific decreased T1 marrow signal in the cervical spine. Skull marrow signal is normal. Sinuses/Orbits: Negative. Other: Mastoids are clear. Visible internal auditory structures appear normal. Scalp and face soft tissues appear negative. MRA HEAD FINDINGS Chronic tortuosity of the distal vertebral arteries with stable antegrade flow in  the posterior circulation. Patent PICA origins. No distal vertebral stenosis. Patent vertebrobasilar junction and basilar artery without stenosis. Motion artifact at the tip of the basilar. Bilateral SCA and PCA origins appear stable and within normal limits. Both posterior communicating arteries are present. Bilateral PCA branches appear within normal limits.  Stable antegrade flow in both ICA siphons. Motion artifact at the supraclinoid segments. No siphon stenosis. Ophthalmic and posterior communicating artery origins appear stable and normal. Patent carotid termini. Normal MCA and ACA origins. Anterior communicating artery and visible ACA branches are within normal limits. MCA M1 segments and visible bilateral MCA branches appears stable and within normal limits. IMPRESSION: 1.  No acute intracranial abnormality. Chronic right MCA perirolandic encephalomalacia related to a 2010 infarct. Occasional chronic microhemorrhage otherwise. 2. 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 Electronically Signed   By: Genevie Ann M.D.   On: 11/09/2018 14:44    Dialysis Orders:  [facility] MWF 4.5h 200NRe 500/800 EDW 144 kg 2K/2Ca  R AVF No heparin  Hectorol 5 mcg IV TIW Venofer 50mg  IV q week Mircera 100 mcg IV q 2 weeks (last 3/9)     Assessment/Plan: 1. AMS - Hx prior CVA. MRI negative for new CVA. Seen by neurology - concern for overmedicating with sedating meds (Lyrica, Cymbalta). Agree with reducing sedating medications.  2. ESRD -  MWF HD today on schedule  3. Hypertension/volume  - BP controlled. No gross volume on exam. UF to EDW as tolerated.  4. Anemia  - Hgb 9.9. Follow trends. Not due for ESA yet.  5. Metabolic bone disease -  Corr Ca 10.2. Continue Sensipar. Binders on Renvela 2 tabs, Velphoro 3 tabs qac as outpatient. Continue Renvela here.  6. Nutrition - Renal diet/vitamins/ Prot supp for low albumin  7. S/p OHT - on IS   Ogechi Larina Earthly PA-C [place] Pager [phone] 11/10/2018, 12:36 PM   Pt seen, examined and agree w A/P as above.  [facility] 11/10/2018, 1:21 PM

## 2018-11-10 NOTE — Progress Notes (Signed)
PT Cancellation Note  Patient Details Name: [name] MRN: [id number] DOB: 03-Apr-1961   Cancelled Treatment:    Reason Eval/Treat Not Completed: Patient at procedure or test/unavailable.  Pt has been gone to HD most of the afternoon.  Will likely see him tomorrow as able. 11/10/2018  [name], [facility] [phone]  (pager) [phone]  (office)   [name] [name] 11/10/2018, 3:56 PM

## 2018-11-10 NOTE — Progress Notes (Signed)
Nutrition Brief Note  RD consulted for assessment of nutrition requirements/status in related to stroke. Pt unavailable during attempted time of visit. RD plans to revisit for full nutrition assessment.   Corrin Parker, MS, RD, LDN Pager # [phone] After hours/ weekend pager # [phone]

## 2018-11-10 NOTE — Progress Notes (Signed)
PROGRESS NOTE  MCGWIRE DASARO QQI:[id number] DOB: 21-Jan-1961 DOA: 11/09/2018 PCP: Carol Ada, MD  HPI/Recap of past 24 hours: [name] is a 58 y.o. male with medical history significant of CVA; prostate CA; OSA no longer on CPAP; obesity; CAD s/p heart transplant; HTN; HLD; DM; and ESRD on HD presenting with stroke symptoms.  He was here week before last - changed from Lyrica to Cymbalta and developed more neuropathy.  He had "slight pneumonia" and was changed back to Lyrica. He manages his own medicine - for this week he has been taking both and also lots of pain medicines.  Friday night-Saturday AM, he was confused and not his usual self.  He was aggressive with more emotional lability.  In the evenings, he has chronic facial droop worse with sleeping - and this was worse last night and this AM.  This AM, with some apparent delirium.  He had difficulty walking -very weak and numb but not sure if one side (usually right side chronically).  He may have messed up his medications.  11/10/18: seen and examined at his bedside. A&O x 3. Reports nausea and started vomiting this am. Given IV zofran. Possible HD today. VSS   Assessment/Plan: Principal Problem:   CVA (cerebral vascular accident) ([place]) Active Problems:   Diabetes mellitus type 2 in obese ([place])   Dyslipidemia   OBSTRUCTIVE SLEEP APNEA   Essential hypertension   Obesity, Class III, BMI 40-49.9 (morbid obesity) ([place])   End stage renal disease on dialysis [facility])   Malignant neoplasm of prostate ([place])   H/O heart transplant ([place])  Resolved toxic metabolic encephalopathy with concern for new stroke Presented with slurred speech and lethargy MRI brain did not show any changes to previous CVA Neurology consulted and signed off Recommendation to reduce sedating medications and optimize his pain medications. 2D echo shows normal ejection fraction with severe left atrial dilatation  Intractable nausea and vomiting, unclear  etiology IV Zofran as needed Plan for hemodialysis today  ESRD on dialysis Monday Wednesday Friday Nephrology has been consulted Possible dialysis today Closely monitor his vital signs and electrolytes  Anemia of chronic disease from ESRD Patient hemoglobin appears to be 10 Hemoglobin 9.9 and stable  Physical debility PT OT to assess From precautions  Post heart transplant Continue immunosuppression regimen No evidence of rejection -Continue cyclosporine and myfortic -No significant CAD on cath in 9/18, on ASA and pravastatin  Morbid obesity -s/p bariatric surgery -Current BMI is 36.5  Prostate CA -Received radiation therapy from 11/19-09/02/18 -Following up outpatient with urology   DVT prophylaxis:Lovenox  Code Status:Full - confirmed with patient/family Family Communication:Daughter present throughout evaluation Disposition Plan:Home once clinically improved Consults called:Neurology; PT/OT/ST/SW/Nutrition   Objective: Vitals:   11/09/18 2056 11/10/18 0023 11/10/18 0420 11/10/18 0844  BP: 118/65 139/75 137/67 105/68  Pulse: 72  73 67  Resp: 16 17 18 16   Temp: 98.3 F (36.8 C) (!) 97.4 F (36.3 C) 97.9 F (36.6 C) 98.2 F (36.8 C)  TempSrc: Oral Oral Oral Oral  SpO2: 100% 97% 100% 97%  Weight:      Height:        Intake/Output Summary (Last 24 hours) at 11/10/2018 1022 Last data filed at 11/09/2018 2200 Gross per 24 hour  Intake 120 ml  Output --  Net 120 ml   Filed Weights   11/09/18 1638  Weight: (!) 149.7 kg    Exam:   General: 58 y.o. year-old male well developed well nourished in no  acute distress.  Alert and oriented x3.  Cardiovascular: Regular rate and rhythm with no rubs or gallops.  No thyromegaly or JVD noted.    Respiratory: Clear to auscultation with no wheezes or rales. Good inspiratory effort.  Abdomen: Soft nontender nondistended with normal bowel sounds x4 quadrants.  Musculoskeletal: No lower extremity edema.  2/4 pulses in all 4 extremities.  Psychiatry: Mood is appropriate for condition and setting   Data Reviewed: CBC: Recent Labs  Lab 11/09/18 0745  WBC 7.1  NEUTROABS 5.9  HGB 9.9*  HCT 30.4*  MCV 92.4  PLT 275   Basic Metabolic Panel: Recent Labs  Lab 11/09/18 0745  NA 138  K 4.8  CL 94*  CO2 27  GLUCOSE 100*  BUN 33*  CREATININE 9.97*  CALCIUM 9.7   GFR: Estimated Creatinine Clearance: 13.3 mL/min (A) (by C-G formula based on SCr of 9.97 mg/dL (H)). Liver Function Tests: Recent Labs  Lab 11/09/18 0745  AST 23  ALT 15  ALKPHOS 67  BILITOT 0.9  PROT 6.6  ALBUMIN 3.4*   No results for input(s): LIPASE, AMYLASE in the last 168 hours. Recent Labs  Lab 11/09/18 0745  AMMONIA <9*   Coagulation Profile: No results for input(s): INR, PROTIME in the last 168 hours. Cardiac Enzymes: No results for input(s): CKTOTAL, CKMB, CKMBINDEX, TROPONINI in the last 168 hours. BNP (last 3 results) No results for input(s): PROBNP in the last 8760 hours. HbA1C: No results for input(s): HGBA1C in the last 72 hours. CBG: No results for input(s): GLUCAP in the last 168 hours. Lipid Profile: No results for input(s): CHOL, HDL, LDLCALC, TRIG, CHOLHDL, LDLDIRECT in the last 72 hours. Thyroid Function Tests: Recent Labs    11/09/18 1609  TSH 1.030   Anemia Panel: No results for input(s): VITAMINB12, FOLATE, FERRITIN, TIBC, IRON, RETICCTPCT in the last 72 hours. Urine analysis:    Component Value Date/Time   COLORURINE YELLOW 09/07/2008 1329   APPEARANCEUR CLEAR 09/07/2008 1329   LABSPEC 1.011 09/07/2008 1329   PHURINE 7.0 09/07/2008 1329   GLUCOSEU NEGATIVE 09/07/2008 1329   HGBUR NEGATIVE 09/07/2008 1329   BILIRUBINUR NEGATIVE 09/07/2008 1329   KETONESUR NEGATIVE 09/07/2008 1329   PROTEINUR NEGATIVE 09/07/2008 1329   UROBILINOGEN 1.0 09/07/2008 1329   NITRITE NEGATIVE 09/07/2008 1329   LEUKOCYTESUR  09/07/2008 1329    NEGATIVE MICROSCOPIC NOT DONE ON URINES WITH  NEGATIVE PROTEIN, BLOOD, LEUKOCYTES, NITRITE, OR GLUCOSE <1000 mg/dL.   Sepsis Labs: @LABRCNTIP (procalcitonin:4,lacticidven:4)  ) Recent Results (from the past 240 hour(s))  MRSA PCR Screening     Status: None   Collection Time: 11/09/18  2:04 PM  Result Value Ref Range Status   MRSA by PCR NEGATIVE NEGATIVE Final    Comment:        The GeneXpert MRSA Assay (FDA approved for NASAL specimens only), is one component of a comprehensive MRSA colonization surveillance program. It is not intended to diagnose MRSA infection nor to guide or monitor treatment for MRSA infections. Performed at [facility], [place] [address], [place], [place] [address]       Studies: Mr Brain 63 Contrast  Result Date: 11/09/2018 CLINICAL DATA:  58 year old male presenting this morning with left side weakness x1 week and evidence of right MCA cortical infarct on plain CT. EXAM: MRI HEAD WITHOUT CONTRAST MRA HEAD WITHOUT CONTRAST TECHNIQUE: Multiplanar, multiecho pulse sequences of the brain and surrounding structures were obtained without intravenous contrast. Angiographic images of the head were obtained using MRA technique  without contrast. COMPARISON:  Head CT earlier today. Brain MRI and intracranial MRA 06/27/2009. FINDINGS: MRI HEAD FINDINGS Brain: Signal changes in the right lateral perirolandic cortex corresponding to the CT finding earlier today are chronic (series 6, image 38), and appear to correspond to an infarct which was acute on the 2010 MRI. No restricted diffusion or evidence of acute infarction. Aside from the right MCA territory encephalomalacia there are occasional chronic micro hemorrhages (right middle frontal gyrus and left occipital pole). There is mild Wallerian degeneration evident in the right brainstem. Pearline Cables and white matter signal is otherwise within normal limits. Generalized cerebral volume loss since 2010. No midline shift, mass effect, evidence of mass lesion, ventriculomegaly,  extra-axial collection or acute intracranial hemorrhage. Cervicomedullary junction and pituitary are within normal limits. Vascular: Major intracranial vascular flow voids are stable. Skull and upper cervical spine: Degenerative appearing cervical spinal stenosis plus chronic nonspecific decreased T1 marrow signal in the cervical spine. Skull marrow signal is normal. Sinuses/Orbits: Negative. Other: Mastoids are clear. Visible internal auditory structures appear normal. Scalp and face soft tissues appear negative. MRA HEAD FINDINGS Chronic tortuosity of the distal vertebral arteries with stable antegrade flow in the posterior circulation. Patent PICA origins. No distal vertebral stenosis. Patent vertebrobasilar junction and basilar artery without stenosis. Motion artifact at the tip of the basilar. Bilateral SCA and PCA origins appear stable and within normal limits. Both posterior communicating arteries are present. Bilateral PCA branches appear within normal limits. Stable antegrade flow in both ICA siphons. Motion artifact at the supraclinoid segments. No siphon stenosis. Ophthalmic and posterior communicating artery origins appear stable and normal. Patent carotid termini. Normal MCA and ACA origins. Anterior communicating artery and visible ACA branches are within normal limits. MCA M1 segments and visible bilateral MCA branches appears stable and within normal limits. IMPRESSION: 1.  No acute intracranial abnormality. Chronic right MCA perirolandic encephalomalacia related to a 2010 infarct. Occasional chronic microhemorrhage otherwise. 2. 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 Electronically Signed   By: Genevie Ann M.D.   On: 11/09/2018 14:44   Mr Virgel Paling SP Contrast  Result Date: 11/09/2018 CLINICAL DATA:  58 year old male presenting this morning with left side weakness x1 week and evidence of right MCA cortical infarct on plain CT. EXAM: MRI HEAD WITHOUT CONTRAST MRA HEAD WITHOUT CONTRAST  TECHNIQUE: Multiplanar, multiecho pulse sequences of the brain and surrounding structures were obtained without intravenous contrast. Angiographic images of the head were obtained using MRA technique without contrast. COMPARISON:  Head CT earlier today. Brain MRI and intracranial MRA 06/27/2009. FINDINGS: MRI HEAD FINDINGS Brain: Signal changes in the right lateral perirolandic cortex corresponding to the CT finding earlier today are chronic (series 6, image 38), and appear to correspond to an infarct which was acute on the 2010 MRI. No restricted diffusion or evidence of acute infarction. Aside from the right MCA territory encephalomalacia there are occasional chronic micro hemorrhages (right middle frontal gyrus and left occipital pole). There is mild Wallerian degeneration evident in the right brainstem. Pearline Cables and white matter signal is otherwise within normal limits. Generalized cerebral volume loss since 2010. No midline shift, mass effect, evidence of mass lesion, ventriculomegaly, extra-axial collection or acute intracranial hemorrhage. Cervicomedullary junction and pituitary are within normal limits. Vascular: Major intracranial vascular flow voids are stable. Skull and upper cervical spine: Degenerative appearing cervical spinal stenosis plus chronic nonspecific decreased T1 marrow signal in the cervical spine. Skull marrow signal is normal. Sinuses/Orbits: Negative. Other: Mastoids are clear. Visible  internal auditory structures appear normal. Scalp and face soft tissues appear negative. MRA HEAD FINDINGS Chronic tortuosity of the distal vertebral arteries with stable antegrade flow in the posterior circulation. Patent PICA origins. No distal vertebral stenosis. Patent vertebrobasilar junction and basilar artery without stenosis. Motion artifact at the tip of the basilar. Bilateral SCA and PCA origins appear stable and within normal limits. Both posterior communicating arteries are present. Bilateral PCA  branches appear within normal limits. Stable antegrade flow in both ICA siphons. Motion artifact at the supraclinoid segments. No siphon stenosis. Ophthalmic and posterior communicating artery origins appear stable and normal. Patent carotid termini. Normal MCA and ACA origins. Anterior communicating artery and visible ACA branches are within normal limits. MCA M1 segments and visible bilateral MCA branches appears stable and within normal limits. IMPRESSION: 1.  No acute intracranial abnormality. Chronic right MCA perirolandic encephalomalacia related to a 2010 infarct. Occasional chronic microhemorrhage otherwise. 2. 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 Electronically Signed   By: Genevie Ann M.D.   On: 11/09/2018 14:44    Scheduled Meds:  aspirin  81 mg Oral Daily   Chlorhexidine Gluconate Cloth  6 each Topical Q0600   cinacalcet  60 mg Oral Q breakfast   cycloSPORINE modified  100 mg Oral BID   And   cycloSPORINE modified  75 mg Oral BID   mycophenolate  720 mg Oral BID   pantoprazole  40 mg Oral Daily   pravastatin  20 mg Oral q1800    Continuous Infusions:   LOS: 1 day     Kayleen Memos, MD [facility] Pager [phone]  If 7PM-7AM, please contact night-coverage [email or web address] Password [facility] 11/10/2018, 10:22 AM

## 2018-11-10 NOTE — Procedures (Signed)
   I was present at this dialysis session, have reviewed the session itself and made  appropriate changes Kelly Splinter MD [place] pager [phone]   11/10/2018, 3:42 PM

## 2018-11-10 NOTE — Evaluation (Addendum)
Speech Language Pathology Evaluation Patient Details Name: [name] MRN: [id number] DOB: May 24, 1961 Today's Date: 11/10/2018 Time: 1140-1200 SLP Time Calculation (min) (ACUTE ONLY): 20 min  Problem List:  Patient Active Problem List   Diagnosis Date Noted  . CVA (cerebral vascular accident) ([place]) 11/09/2018  . H/O heart transplant ([place]) 11/09/2018  . Malignant neoplasm of prostate ([place]) 05/06/2018  . Closed posterior dislocation of hip, right, initial encounter ([place]) 08/24/2017  . End stage renal disease on dialysis ([place]) 08/24/2017  . Hip dislocation, right ([place]) 08/24/2017  . Osteoarthritis of right hip 07/22/2017  . VENTRICULAR TACHYCARDIA 12/21/2009  . Dyslipidemia 08/30/2009  . Essential hypertension 08/30/2009  . ISCHEMIC CARDIOMYOPATHY 08/30/2009  . Atrial fibrillation ([place]) 08/30/2009  . VENTRICULAR ECTOPY 08/30/2009  . CEREBROVASCULAR ACCIDENT, HX OF 08/30/2009  . Obesity, Class III, BMI 40-49.9 (morbid obesity) ([place]) 08/30/2009  . Diabetes mellitus type 2 in obese ([place]) 10/17/2007  . OBSTRUCTIVE SLEEP APNEA 10/17/2007  . CARDIOMYOPATHY, DILATED 10/17/2007   Past Medical History:  Past Medical History:  Diagnosis Date  . Arthritis   . Atrial fibrillation ([place])   . CHF (congestive heart failure), NYHA class III (HCC)    s/p heart transplant  . Chronic kidney disease    end stage  . Chronic systolic dysfunction of left ventricle   . CVA (cerebral infarction)   . Diabetes mellitus    PMH; Prior to heart transplant  . Family history of coronary artery disease    in both parents  . Hyperlipidemia   . Hypertension   . Left bundle branch block   . Morbid obesity ([place])    status post lap band  . Myocardial infarction [facility])    prior to heart transplant  . Nonischemic cardiomyopathy ([place])    prior to heart transplant  . Obesity (BMI 30-39.9)   . Obstructive sleep apnea    no longer needs CPAP after heart transplant per pt  . Premature ventricular  contractions   . Prostate cancer ([place])   . SOB (shortness of breath)    Past Surgical History:  Past Surgical History:  Procedure Laterality Date  . CARDIAC PACEMAKER PLACEMENT  09/21/2009   Biventricular implantable cardioverter-defibrillator implantation     . COLONOSCOPY W/ BIOPSIES AND POLYPECTOMY    . FOOT SURGERY     left  . HEART TRANSPLANT  2014  . LAPAROSCOPIC GASTRIC BANDING  01/27/2007  . LEFT VENTRICULAR ASSIST DEVICE     implanted at [facility]  . PROSTATE BIOPSY    . TOTAL HIP ARTHROPLASTY Right 07/22/2017   Procedure: RIGHT TOTAL HIP ARTHROPLASTY ANTERIOR APPROACH;  Surgeon: Rod Can, MD;  Location: [facility];  Service: Orthopedics;  Laterality: Right;  Needs RNFA  . TOTAL KNEE ARTHROPLASTY Right 07/22/2017   HPI:      Assessment / Plan / Recommendation Clinical Impression  The [place] Mental Status (SLUMS) Examination was administered. Pt scored 13/30, indicating significant cognitive deficits. Points were lost on orientation, immediate and delayed recall, mental mathematics, reverse digit repetition, clock drawing, and attention, auditory recall. Pt's wife was present during this evaluation, and reports prior to this weekend, pt was independent with meds and finances, and exhibited only right side weakness as residual from prior CVAs (2010 and 2013). Wife indicated concern for medication mixup at home this weekend, which she feels is contributing to or caused sudden onset of confusion. MRI is negative for acute abnormality.   Based on this assessment today, pt is currently significantly impaired, and 24  hour supervision is recommended if no appreciable change in cognition is seen prior to discharge.     SLP Assessment  SLP Recommendation/Assessment: All further Speech Language Pathology needs can be addressed in the next venue of care(if needed)  SLP Visit Diagnosis: Cognitive communication deficit (R41.841)    Follow Up Recommendations  24 hour  supervision/assistance(if cognitive status does not improve prior to DC)       SLP Evaluation Cognition  Overall Cognitive Status: Impaired/Different from baseline Arousal/Alertness: Awake/alert Orientation Level: Oriented to person;Oriented to place;Disoriented to time;Disoriented to situation Attention: Focused;Sustained Focused Attention: Appears intact Sustained Attention: Impaired Sustained Attention Impairment: Verbal basic Memory: Impaired Memory Impairment: Storage deficit;Retrieval deficit;Decreased recall of new information;Decreased short term memory Decreased Short Term Memory: Verbal basic Awareness: Impaired Awareness Impairment: Intellectual impairment Problem Solving: Impaired Problem Solving Impairment: Verbal basic       Comprehension  Auditory Comprehension Overall Auditory Comprehension: Appears within functional limits for tasks assessed Interfering Components: Attention;Working Marine scientist    Expression Expression Primary Mode of Expression: Verbal Verbal Expression Overall Verbal Expression: Appears within functional limits for tasks assessed Written Expression Dominant Hand: Right   Oral / Motor  Oral Motor/Sensory Function Overall Oral Motor/Sensory Function: Mild impairment Facial ROM: Reduced right Facial Symmetry: Abnormal symmetry right Facial Strength: Within Functional Limits Facial Sensation: Within Functional Limits Lingual ROM: Within Functional Limits Lingual Symmetry: Within Functional Limits Lingual Strength: Reduced Lingual Sensation: Within Functional Limits Mandible: Within Functional Limits Motor Speech Overall Motor Speech: Appears within functional limits for tasks assessed Intelligibility: Intelligible(slight dysarthria)   GO                   Kiet Geer B. Quentin Ore [facility], [place] Speech Language Pathologist [phone]  Shonna Chock 11/10/2018, 12:11 PM

## 2018-11-11 LAB — CBC WITH DIFFERENTIAL/PLATELET
Abs Immature Granulocytes: 0.08 10*3/uL — ABNORMAL HIGH (ref 0.00–0.07)
Basophils Absolute: 0.1 10*3/uL (ref 0.0–0.1)
Basophils Relative: 1 %
Eosinophils Absolute: 0.2 10*3/uL (ref 0.0–0.5)
Eosinophils Relative: 2 %
HCT: 31.4 % — ABNORMAL LOW (ref 39.0–52.0)
Hemoglobin: 10 g/dL — ABNORMAL LOW (ref 13.0–17.0)
Immature Granulocytes: 1 %
LYMPHS ABS: 0.8 10*3/uL (ref 0.7–4.0)
Lymphocytes Relative: 11 %
MCH: 29.2 pg (ref 26.0–34.0)
MCHC: 31.8 g/dL (ref 30.0–36.0)
MCV: 91.8 fL (ref 80.0–100.0)
Monocytes Absolute: 0.5 10*3/uL (ref 0.1–1.0)
Monocytes Relative: 7 %
Neutro Abs: 6 10*3/uL (ref 1.7–7.7)
Neutrophils Relative %: 78 %
Platelets: 213 10*3/uL (ref 150–400)
RBC: 3.42 MIL/uL — ABNORMAL LOW (ref 4.22–5.81)
RDW: 14.1 % (ref 11.5–15.5)
WBC: 7.5 10*3/uL (ref 4.0–10.5)
nRBC: 0 % (ref 0.0–0.2)

## 2018-11-11 LAB — COMPREHENSIVE METABOLIC PANEL
ALT: 13 U/L (ref 0–44)
ANION GAP: 10 (ref 5–15)
AST: 18 U/L (ref 15–41)
Albumin: 3.1 g/dL — ABNORMAL LOW (ref 3.5–5.0)
Alkaline Phosphatase: 63 U/L (ref 38–126)
BUN: 19 mg/dL (ref 6–20)
CO2: 29 mmol/L (ref 22–32)
Calcium: 9.3 mg/dL (ref 8.9–10.3)
Chloride: 96 mmol/L — ABNORMAL LOW (ref 98–111)
Creatinine, Ser: 7.72 mg/dL — ABNORMAL HIGH (ref 0.61–1.24)
GFR calc Af Amer: 8 mL/min — ABNORMAL LOW (ref >60)
GFR calc non Af Amer: 7 mL/min — ABNORMAL LOW (ref >60)
Glucose, Bld: 112 mg/dL — ABNORMAL HIGH (ref 70–99)
POTASSIUM: 3.7 mmol/L (ref 3.5–5.1)
Sodium: 135 mmol/L (ref 135–145)
Total Bilirubin: 0.8 mg/dL (ref 0.3–1.2)
Total Protein: 6.6 g/dL (ref 6.5–8.1)

## 2018-11-11 LAB — HIV ANTIBODY (ROUTINE TESTING W REFLEX): HIV Screen 4th Generation wRfx: NONREACTIVE

## 2018-11-11 LAB — HEPATITIS B SURFACE ANTIBODY,QUALITATIVE: Hep B S Ab: NONREACTIVE

## 2018-11-11 MED ORDER — HYDROXYZINE HCL 25 MG PO TABS: 25.0000 mg | ORAL_TABLET | Freq: Every day | ORAL | 0 refills | Status: DC | PRN

## 2018-11-11 MED ORDER — SEVELAMER CARBONATE 800 MG PO TABS: 1600.0000 mg | ORAL_TABLET | Freq: Three times a day (TID) | ORAL | 0 refills | Status: AC

## 2018-11-11 MED ORDER — CAMPHOR-MENTHOL 0.5-0.5 % EX LOTN: 1.0000 "application " | TOPICAL_LOTION | Freq: Three times a day (TID) | CUTANEOUS | 0 refills | Status: DC | PRN

## 2018-11-11 NOTE — TOC Initial Note (Signed)
Transition of Care [facility]) - Initial/Assessment Note    Patient Details  Name: [name] MRN: [id number] Date of Birth: 12-Jan-1961  Transition of Care [facility]) CM/SW Contact:    Pollie Friar, RN Phone Number: 11/11/2018, 10:52 AM  Clinical Narrative:                 Family providing transport home.  Expected Discharge Plan: [employer] Barriers to Discharge: No Barriers Identified   Patient Goals and CMS Choice Patient states their goals for this hospitalization and ongoing recovery are:: To get better CMS Medicare.gov Compare Post Acute Care list provided to:: Patient Choice offered to / list presented to : Patient  Expected Discharge Plan and Services Expected Discharge Plan: [place] Discharge Planning Services: CM Consult Post Acute Care Choice: Home Health   Expected Discharge Date: 11/11/18                   HH Arranged: RN, PT, OT, Nurse's Aide HH Agency: [facility])--Carolyn with [facility] accepted the referral.  Prior Living Arrangements/Services   Lives with:: Spouse Patient language and need for interpreter reviewed:: Yes(no needs) Do you feel safe going back to the place where you live?: Yes      Need for Family Participation in Patient Care: Yes (Comment)(24 hour supervision) Care giver support system in place?: Yes (comment)(wife) Current home services: DME(3 in 1) Criminal Activity/Legal Involvement Pertinent to Current Situation/Hospitalization: No - Comment as needed  Activities of Daily Living Home Assistive Devices/Equipment: Cane (specify quad or straight) ADL Screening (condition at time of admission) Patient's cognitive ability adequate to safely complete daily activities?: Yes Is the patient deaf or have difficulty hearing?: No Does the patient have difficulty seeing, even when wearing glasses/contacts?: No Does the patient have difficulty concentrating, remembering, or  making decisions?: No Patient able to express need for assistance with ADLs?: Yes Does the patient have difficulty dressing or bathing?: No Independently performs ADLs?: Yes (appropriate for developmental age) Does the patient have difficulty walking or climbing stairs?: Yes Weakness of Legs: Both Weakness of Arms/Hands: Both  Permission Sought/Granted                  Emotional Assessment Appearance:: Appears stated age Attitude/Demeanor/Rapport: Gracious Affect (typically observed): Accepting, Appropriate, Pleasant Orientation: : Oriented to Self, Oriented to Place, Oriented to  Time, Oriented to Situation   Psych Involvement: No (comment)  Admission diagnosis:  Delirium [R41.0] Cerebrovascular accident (CVA), unspecified mechanism ([place]) [I63.9] Patient Active Problem List   Diagnosis Date Noted  . CVA (cerebral vascular accident) ([place]) 11/09/2018  . H/O heart transplant ([place]) 11/09/2018  . Malignant neoplasm of prostate ([place]) 05/06/2018  . Closed posterior dislocation of hip, right, initial encounter ([place]) 08/24/2017  . End stage renal disease on dialysis ([place]) 08/24/2017  . Hip dislocation, right ([place]) 08/24/2017  . Osteoarthritis of right hip 07/22/2017  . VENTRICULAR TACHYCARDIA 12/21/2009  . Dyslipidemia 08/30/2009  . Essential hypertension 08/30/2009  . ISCHEMIC CARDIOMYOPATHY 08/30/2009  . Atrial fibrillation ([place]) 08/30/2009  . VENTRICULAR ECTOPY 08/30/2009  . CEREBROVASCULAR ACCIDENT, HX OF 08/30/2009  . Obesity, Class III, BMI 40-49.9 (morbid obesity) ([place]) 08/30/2009  . Diabetes mellitus type 2 in obese ([place]) 10/17/2007  . OBSTRUCTIVE SLEEP APNEA 10/17/2007  . CARDIOMYOPATHY, DILATED 10/17/2007   PCP:  Carol Ada, MD Pharmacy:   [facility]  [place] [address] Phone: [phone] Fax: [phone]     Social Determinants of  Health (SDOH) Interventions    Readmission Risk Interventions 30 Day Unplanned Readmission Risk Score     ED to Hosp-Admission (Current) from 11/09/2018 in [facility]  30 Day Unplanned Readmission Risk Score (%)  20 Filed at 11/11/2018 0801     This score is the patient's risk of an unplanned readmission within 30 days of being discharged (0 -100%). The score is based on dignosis, age, lab data, medications, orders, and past utilization.   Low:  0-14.9   Medium: 15-21.9   High: 22-29.9   Extreme: 30 and above       No flowsheet data found.

## 2018-11-11 NOTE — Progress Notes (Signed)
Occupational Therapy Treatment Patient Details Name: [name] MRN: [id number] DOB: 09/17/1960 Today's Date: 11/11/2018    History of present illness Pt is a 58 y/o male with PMH of CHF s/p heart transplant on immunosuppression, a fib, R sided CVA with residual L sided weakness, diabetes, HTN, ESRD on hemodialysis brough to hospital due to confusion and emotional lability. CT reveals chronic MCA infarction, MRI reveals no acute changes. Workup for toxic metabolic encepalopathy per neurology.    OT comments  Reviewed tub transfer safety and use of 3:1 commode as shower chair with pt and daughter.  Provided handout and pt simulated tub transfer in room with min guard for safety.  Reviewed bathing seated and use of non skid strips.  Pt agreeable to recommendations.  Noted improved cognition, processing today. Continue to recommend [place].  Plan for dc home today.    Follow Up Recommendations  Supervision/Assistance - 24 hour;Home health OT    Equipment Recommendations  None recommended by OT(has 3:1 commode)    Recommendations for Other Services      Precautions / Restrictions Restrictions Weight Bearing Restrictions: No       Mobility Bed Mobility Overal bed mobility: Needs Assistance Bed Mobility: Supine to Sit     Supine to sit: Supervision     General bed mobility comments: EOB upon entry  Transfers Overall transfer level: Needs assistance Equipment used: None Transfers: Sit to/from Stand Sit to Stand: Min guard;Supervision         General transfer comment: close supervision to min guard for safety and balance     Balance Overall balance assessment: Needs assistance Sitting-balance support: No upper extremity supported;Feet supported Sitting balance-Leahy Scale: Good     Standing balance support: No upper extremity supported;During functional activity Standing balance-Leahy Scale: Fair Standing balance comment: initial unsteadiness improved somewhat after  warm up.                 Standardized Balance Assessment Standardized Balance Assessment : Dynamic Gait Index   Dynamic Gait Index Level Surface: Mild Impairment Change in Gait Speed: Mild Impairment Gait with Horizontal Head Turns: Normal Gait with Vertical Head Turns: Normal Gait and Pivot Turn: Mild Impairment Step Over Obstacle: Mild Impairment Step Around Obstacles: Normal Steps: Mild Impairment Total Score: 19     ADL either performed or assessed with clinical judgement   ADL Overall ADL's : Needs assistance/impaired             Lower Body Bathing: Supervison/ safety;Sit to/from stand Lower Body Bathing Details (indicate cue type and reason): reviewed LB bathing seated for safety, shower safety                  Tub/ Shower Transfer: Min guard;Tub transfer;Ambulation;3 in 1 Tub/Shower Transfer Details (indicate cue type and reason): simulated tub transfer in room, educated on use of 3:1 for safety and setup in tub --provided handout Functional mobility during ADLs: Supervision/safety       Vision       Perception     Praxis      Cognition Arousal/Alertness: Awake/alert Behavior During Therapy: WFL for tasks assessed/performed Overall Cognitive Status: Impaired/Different from baseline Area of Impairment: Attention;Memory;Following commands;Safety/judgement;Awareness;Problem solving                   Current Attention Level: Sustained Memory: Decreased short-term memory;Decreased recall of precautions Following Commands: Follows one step commands consistently;Follows multi-step commands with increased time Safety/Judgement: Decreased awareness of safety;Decreased awareness of deficits Awareness:  Emergent Problem Solving: Slow processing;Difficulty sequencing;Decreased initiation;Requires verbal cues General Comments: pt able to follow commands with increased speed and comprehension today,         Exercises     Shoulder Instructions        General Comments daughter present and supportive    Pertinent Vitals/ Pain       Pain Assessment: No/denies pain  Home Living Family/patient expects to be discharged to:: Private residence Living Arrangements: Spouse/significant other Available Help at Discharge: Family;Available PRN/intermittently(spouse works during daytime) Type of Home: House Home Access: Stairs to enter [employer] of Steps: 5 Entrance Stairs-Rails: Right Home Layout: One level     Bathroom Shower/Tub: Teacher, early years/pre: Handicapped height     Home Equipment: Environmental consultant - 2 wheels;Cane - single point;Bedside commode          Prior Functioning/Environment Level of Independence: Independent        Comments: sometimes using cane if neuropathy is flared up, completes IADLs, manages medications, drives    Frequency  Min 2X/week        Progress Toward Goals  OT Goals(current goals can now be found in the care plan section)  Progress towards OT goals: Progressing toward goals  Acute Rehab OT Goals Patient Stated Goal: to get home OT Goal Formulation: With patient Time For Goal Achievement: 11/24/18 Potential to Achieve Goals: Good  Plan Discharge plan remains appropriate;Frequency remains appropriate    Co-evaluation                 AM-PAC OT "6 Clicks" Daily Activity     Outcome Measure   Help from another person eating meals?: None Help from another person taking care of personal grooming?: A Little Help from another person toileting, which includes using toliet, bedpan, or urinal?: A Little Help from another person bathing (including washing, rinsing, drying)?: A Little Help from another person to put on and taking off regular upper body clothing?: None Help from another person to put on and taking off regular lower body clothing?: A Little 6 Click Score: 20    End of Session    OT Visit Diagnosis: Other abnormalities of gait and mobility  (R26.89);Other symptoms and signs involving cognitive function   Activity Tolerance Patient tolerated treatment well   Patient Left with call bell/phone within reach;with family/visitor present;Other (comment)(seated EOB )   Nurse Communication Mobility status;Precautions        Time: 0539-7673 OT Time Calculation (min): 8 min  Charges: OT General Charges $OT Visit: 1 Visit OT Treatments $Self Care/Home Management : 8-22 mins  Delight Stare, [place] Pager [phone] Office [phone]    Delight Stare 11/11/2018, 11:51 AM

## 2018-11-11 NOTE — TOC Transition Note (Signed)
Transition of Care [facility]) - CM/SW Discharge Note   Patient Details  Name: KITAI PURDOM MRN: [id number] Date of Birth: Aug 10, 1961  Transition of Care [facility] CM/SW Contact:  Pollie Friar, RN Phone Number: 11/11/2018, 10:53 AM   Clinical Narrative:     Family providing transport home.  Final next level of care: [place] Barriers to Discharge: No Barriers Identified   Patient Goals and CMS Choice Patient states their goals for this hospitalization and ongoing recovery are:: To get better CMS Medicare.gov Compare Post Acute Care list provided to:: Patient Choice offered to / list presented to : Patient  Discharge Placement                       Discharge Plan and Services Discharge Planning Services: CM Consult Post Acute Care Choice: Home Health              HH Arranged: RN, PT, OT, Nurse's Aide [place] Agency: [facility]Now [facility]   Social Determinants of Health (SDOH) Interventions     Readmission Risk Interventions No flowsheet data found.

## 2018-11-11 NOTE — Progress Notes (Signed)
IV  Discontinued without complication. Patient education completed with patient, and family members. Patient and family verbalize understanding of discharge instructions. Patient discharged from facility via wheelchair.

## 2018-11-11 NOTE — Progress Notes (Signed)
  Speech Language Pathology Treatment: Cognitive-Linquistic  Patient Details Name: [name] MRN: [id number] DOB: 07/04/1961 Today's Date: 11/11/2018 Time: 3382-5053 SLP Time Calculation (min) (ACUTE ONLY): 19 min  Assessment / Plan / Recommendation Clinical Impression  SLP followed up for treatment of acute cognitive decline. Pt reports he is to DC home this date and has 24 hour assistance with spouse and daughter available. Facilitated recall tasks and educated upon compensatory memory strategies including use of environmental visual aids and strategies for self monitoring. Pt stated he believed prior pain medicine taken at home impacted cognitive abilities. Recommended assistance with all complex activities of daily living including medicine management, etc to maximize safety and independence.    HPI HPI: Pt is a 59 y.o. male past medical history of CHF status post heart transplant on immunosuppression, atrial fibrillation, history of old R-sided stroke with residual minimal L-sided weakness more prominent on exertion and tiredness, diabetes, HTN, hyperlipidemia, sleep apnea not requiring CPAP after heart transplant and weight reduction, ESRD on hemodialysis, presenting with slurred speech, increasing lethargy, some confusion and emotional lability. Wife reports that over the past year or 2, he has been having some trouble with short-term memory. MRI revealed no acute intracranial abnormality. 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      SLP Plan  Other (Comment)(Pt to DC home today )       Recommendations  Diet recommendations: Regular;Thin liquid                Follow up Recommendations: 24 hour supervision/assistance SLP Visit Diagnosis: Cognitive communication deficit (R41.841) Plan: Other (Comment)(Pt to DC home today )       [place] MA, CCC-SLP Acute Rehab Speech Language Pathologist  11/11/2018, 9:51 AM

## 2018-11-11 NOTE — Discharge Summary (Signed)
Discharge Summary  [name] QMG:[id number] DOB: 1960/10/21  PCP: Carol Ada, MD  Admit date: 11/09/2018 Discharge date: 11/11/2018  Time spent: 35 minutes  Recommendations for Outpatient Follow-up:  1. Follow-up with your PCP 2. Continue hemodialysis as scheduled 3. Take your medications as prescribed  Discharge Diagnoses:  Active Hospital Problems   Diagnosis Date Noted   CVA (cerebral vascular accident) ([place]) 11/09/2018   H/O heart transplant ([place]) 11/09/2018   Malignant neoplasm of prostate ([place]) 05/06/2018   End stage renal disease on dialysis ([place]) 08/24/2017   Dyslipidemia 08/30/2009   Essential hypertension 08/30/2009   Obesity, Class III, BMI 40-49.9 (morbid obesity) ([place]) 08/30/2009   Diabetes mellitus type 2 in obese ([place]) 10/17/2007   OBSTRUCTIVE SLEEP APNEA 10/17/2007    Resolved Hospital Problems  No resolved problems to display.    Discharge Condition: Stable  Diet recommendation: Resume previous diet  Vitals:   11/11/18 0340 11/11/18 0751  BP: (!) 116/57 121/77  Pulse: 84 72  Resp: 18 17  Temp: 98.3 F (36.8 C) 98.2 F (36.8 C)  SpO2: 99% 99%    History of present illness:   [name] a 58 y.o.malewith medical history significant ofCVA; prostate CA; OSA no longer on CPAP; obesity; CAD s/p heart transplant; HTN; HLD; DM; and ESRD on HD presenting with stroke symptoms.He was here week before last - changed from Lyrica to Cymbalta and developed more neuropathy. He had "slight pneumonia" and was changed back to Lyrica. He manages his own medicine - for this week he has been taking both and also lots of pain medicines. Friday night-Saturday AM, he was confused and not his usual self. He was aggressive with more emotional lability. In the evenings, he has chronic facial droop worse with sleeping - and this was worse last night and this AM. This AM, with some apparent delirium. He had difficulty walking -very weak and numb  but not sure if one side (usually right side chronically). He may have messed up his medications.  11/11/18: Patient seen and examined at his bedside.  No acute events overnight.  He is alert and oriented x3.  He states he feels great.  Denies any nausea abdominal pain or weakness.  He wants to go home.  No new complaints.  On the day of discharge, the patient was hemodynamically stable.  He will need to follow-up with his primary care provider and nephrology posthospitalization.   Hospital Course:  Principal Problem:   CVA (cerebral vascular accident) [facility]) Active Problems:   Diabetes mellitus type 2 in obese (H[facility])   Dyslipidemia   OBSTRUCTIVE SLEEP APNEA   Essential hypertension   Obesity, Class III, BMI 40-49.9 (morbid obesity) ([place])   End stage renal disease on dialysis ([place])   Malignant neoplasm of prostate ([place])   H/O heart transplant ([place])  Resolved toxic metabolic encephalopathy with concern for new stroke Presented with slurred speech and lethargy Resolved He is alert and oriented x3 with clear speech MRI brain did not show any changes to previous CVA Neurology consulted and signed off Recommendation to reduce sedating medications and optimize his pain medications. 2D echo shows normal ejection fraction with severe left atrial dilatation DC'd duloxetine due to his end-stage renal disease to avoid toxicity.  Resolved intractable nausea and vomiting suspect secondary to uremia  Zofran as needed Last hemodialysis was on 11/10/2018 inpatient  ESRD on dialysis Monday Wednesday Friday Nephrology has been consulted Last hemodialysis on 11/10/2018 Vital signs are stable Lab studies stable  Anemia of chronic disease from ESRD Patient hemoglobin at baseline appears to be 10 Hemoglobin 10.0 on day of discharge stable and at baseline  Physical debility PT OT assessment with recommendations for 24-hour supervision, home health OT with 3 in 1 bedside commode Fall  precautions  Post heart transplant Continue immunosuppression regimen No evidence of rejection -Continue cyclosporine and myfortic -No significant CAD on cath in 9/18, on ASA and pravastatin  Morbid obesity -s/p bariatric surgery -Current BMI is 35  Prostate CA -Received radiation therapy from 11/19-09/02/18 -Following up outpatient with urology    Code Status:Full   Consults called:Neurology, nephrology   Discharge Exam: BP 121/77 (BP Location: Left Arm)    Pulse 72    Temp 98.2 F (36.8 C) (Oral)    Resp 17    Ht 6\' 7"  (2.007 m)    Wt (!) 143.2 kg    SpO2 99%    BMI 35.56 kg/m   General: 58 y.o. year-old male well developed well nourished in no acute distress.  Alert and oriented x3.  Cardiovascular: Regular rate and rhythm with no rubs or gallops.  No thyromegaly or JVD noted.    Respiratory: Clear to auscultation with no wheezes or rales. Good inspiratory effort.  Abdomen: Soft nontender nondistended with normal bowel sounds x4 quadrants.  Musculoskeletal: No lower extremity edema. 2/4 pulses in all 4 extremities.  Skin: No ulcerative lesions noted or rashes,  Psychiatry: Mood is appropriate for condition and setting  Discharge Instructions You were cared for by a hospitalist during your hospital stay. If you have any questions about your discharge medications or the care you received while you were in the hospital after you are discharged, you can call the unit and asked to speak with the hospitalist on call if the hospitalist that took care of you is not available. Once you are discharged, your primary care physician will handle any further medical issues. Please note that NO REFILLS for any discharge medications will be authorized once you are discharged, as it is imperative that you return to your primary care physician (or establish a relationship with a primary care physician if you do not have one) for your aftercare needs so that they can reassess your  need for medications and monitor your lab values.   Allergies as of 11/11/2018      Reactions   Heparin Nausea Only, Swelling, Other (See Comments)    * * HIT * * SWELLING REACTION UNSPECIFIED  DIAPHORESIS   Penicillin G Potassium [penicillin G] Nausea Only, Other (See Comments)   DIAPHORESIS      Medication List    STOP taking these medications   DULoxetine 30 MG capsule Commonly known as:  CYMBALTA   HYDROcodone-acetaminophen 5-325 MG tablet Commonly known as:  NORCO/VICODIN   oxyCODONE 5 MG immediate release tablet Commonly known as:  Oxy IR/ROXICODONE     TAKE these medications   aspirin 81 MG chewable tablet Chew 1 tablet (81 mg total) by mouth 2 (two) times daily. What changed:  when to take this   camphor-menthol lotion Commonly known as:  SARNA Apply 1 application topically every 8 (eight) hours as needed for itching.   cinacalcet 60 MG tablet Commonly known as:  SENSIPAR Take 60 mg by mouth daily.   cycloSPORINE modified 100 MG capsule Commonly known as:  NEORAL Take 175 mg by mouth 2 (two) times daily. Take one capsule (100 mg) by mouth twice daily with 3 capsules (75 mg) for a total  dose of 175 mg twice daily (both modified)   Neoral 25 MG capsule Generic drug:  cycloSPORINE modified Take 75 mg by mouth 2 (two) times daily. Take with 100 mg tablet to equal a total of 175 mg   hydrOXYzine 25 MG tablet Commonly known as:  ATARAX/VISTARIL Take 1 tablet (25 mg total) by mouth daily as needed for itching.   lidocaine 5 % ointment Commonly known as:  XYLOCAINE Apply 1 application topically as needed. Apply to feet twice daily as needed.   lidocaine-prilocaine cream Commonly known as:  EMLA Apply 1 application topically as needed (port).   Melatonin 10 MG Tabs Take 10 mg by mouth as needed (sleep).   mycophenolate 360 MG Tbec EC tablet Commonly known as:  MYFORTIC Take 720 mg by mouth 2 (two) times daily.   Nascobal 500 MCG/0.1ML Soln Generic  drug:  Cyanocobalamin Place 500 mcg into the nose daily as needed (energy boost).   omeprazole 20 MG capsule Commonly known as:  PRILOSEC Take 20 mg by mouth daily.   ondansetron 4 MG tablet Commonly known as:  ZOFRAN Take 1 tablet (4 mg total) by mouth every 6 (six) hours as needed for nausea. What changed:  when to take this   pravastatin 40 MG tablet Commonly known as:  PRAVACHOL Take 1 tablet (40 mg total) by mouth daily at 6 PM. What changed:    how much to take  when to take this   [place] Caps 1 MG Caps Take 1 capsule by mouth daily.   senna 8.6 MG Tabs tablet Commonly known as:  SENOKOT Take 2 tablets (17.2 mg total) by mouth at bedtime. What changed:    when to take this  reasons to take this   sevelamer carbonate 800 MG tablet Commonly known as:  RENVELA Take 2 tablets (1,600 mg total) by mouth 3 (three) times daily with meals.   tadalafil 20 MG tablet Commonly known as:  ADCIRCA/CIALIS Take 20 mg by mouth daily as needed for erectile dysfunction.            Durable Medical Equipment  (From admission, onward)         Start     Ordered   11/11/18 0911  For home use only DME Bedside commode  Once    Comments:  3 in 1 bedside commode  Question:  Patient needs a bedside commode to treat with the following condition  Answer:  Ambulatory dysfunction   11/11/18 0910         Allergies  Allergen Reactions   Heparin Nausea Only, Swelling and Other (See Comments)     * * HIT * * SWELLING REACTION UNSPECIFIED  DIAPHORESIS   Penicillin G Potassium [Penicillin G] Nausea Only and Other (See Comments)    DIAPHORESIS   Follow-up Information    Carol Ada, MD. Call in 1 day(s).   Specialty:  Family Medicine Why:  Call for post hospital follow-up appointment Contact information: [address], [place] [address] [phone]            The results of significant diagnostics from this hospitalization (including imaging,  microbiology, ancillary and laboratory) are listed below for reference.    Significant Diagnostic Studies: Dg Chest 2 View  Result Date: 11/09/2018 CLINICAL DATA:  Mental status changes.  Slurred speech. EXAM: CHEST - 2 VIEW COMPARISON:  10/21/2018 FINDINGS: Previous median sternotomy. Chronic cardiomegaly. Pulmonary venous hypertension. Pulmonary arteries prominent consistent pulmonary arterial hypertension. No frank edema.  Mild chronic volume loss left base. No infiltrate, new collapse or effusion. Lead fragment again projects over the left heart. IMPRESSION: Cardiomegaly. Pulmonary arterial and pulmonary venous hypertension. No frank edema. No consolidation or new collapse. Mild chronic volume loss left base. Electronically Signed   By: Nelson Chimes M.D.   On: 11/09/2018 08:22   Dg Chest 2 View  Result Date: 10/21/2018 CLINICAL DATA:  58 year old heart transplant patient presenting with worsening peripheral neuropathy in his hands and feet over the past several days. End stage renal disease on hemodialysis. EXAM: CHEST - 2 VIEW COMPARISON:  07/26/2016 and earlier. FINDINGS: Sternotomy. Cardiac silhouette moderately to markedly enlarged, unchanged. Consolidation and streaky opacities in the LEFT LOWER LOBE. Pulmonary venous hypertension without overt edema. Lungs otherwise clear. No visible pleural effusions. Stable mild chronic elevation of the LEFT hemidiaphragm. Degenerative changes involving the thoracic spine. IMPRESSION: 1. Acute pneumonia is suspected involving the LEFT LOWER LOBE. 2. Stable moderate to marked cardiomegaly. Pulmonary venous hypertension without overt edema. Electronically Signed   By: Evangeline Dakin M.D.   On: 10/21/2018 16:27   Ct Head Wo Contrast  Result Date: 11/09/2018 CLINICAL DATA:  58 year old male with acute LEFT sided weakness for 1 week. EXAM: CT HEAD WITHOUT CONTRAST TECHNIQUE: Contiguous axial images were obtained from the base of the skull through the vertex  without intravenous contrast. COMPARISON:  05/15/2012 CT and 09/06/2008 MR FINDINGS: Brain: A small to moderate RIGHT frontoparietal infarct is noted without hemorrhage. Mild chronic small-vessel white matter ischemic changes noted. No extra-axial collection, mass lesion, midline shift or hydrocephalus. Vascular: No hyperdense vessel or unexpected calcification. Skull: Normal. Negative for fracture or focal lesion. Sinuses/Orbits: No acute finding. Other: None. IMPRESSION: 1. RIGHT frontoparietal infarct without hemorrhage, likely subacute given history. 2. Mild chronic small-vessel white matter ischemic changes. Electronically Signed   By: Margarette Canada M.D.   On: 11/09/2018 08:11   Mr Brain Wo Contrast  Result Date: 11/09/2018 CLINICAL DATA:  58 year old male presenting this morning with left side weakness x1 week and evidence of right MCA cortical infarct on plain CT. EXAM: MRI HEAD WITHOUT CONTRAST MRA HEAD WITHOUT CONTRAST TECHNIQUE: Multiplanar, multiecho pulse sequences of the brain and surrounding structures were obtained without intravenous contrast. Angiographic images of the head were obtained using MRA technique without contrast. COMPARISON:  Head CT earlier today. Brain MRI and intracranial MRA 06/27/2009. FINDINGS: MRI HEAD FINDINGS Brain: Signal changes in the right lateral perirolandic cortex corresponding to the CT finding earlier today are chronic (series 6, image 38), and appear to correspond to an infarct which was acute on the 2010 MRI. No restricted diffusion or evidence of acute infarction. Aside from the right MCA territory encephalomalacia there are occasional chronic micro hemorrhages (right middle frontal gyrus and left occipital pole). There is mild Wallerian degeneration evident in the right brainstem. Pearline Cables and white matter signal is otherwise within normal limits. Generalized cerebral volume loss since 2010. No midline shift, mass effect, evidence of mass lesion, ventriculomegaly,  extra-axial collection or acute intracranial hemorrhage. Cervicomedullary junction and pituitary are within normal limits. Vascular: Major intracranial vascular flow voids are stable. Skull and upper cervical spine: Degenerative appearing cervical spinal stenosis plus chronic nonspecific decreased T1 marrow signal in the cervical spine. Skull marrow signal is normal. Sinuses/Orbits: Negative. Other: Mastoids are clear. Visible internal auditory structures appear normal. Scalp and face soft tissues appear negative. MRA HEAD FINDINGS Chronic tortuosity of the distal vertebral arteries with stable antegrade flow in the posterior circulation. Patent PICA  origins. No distal vertebral stenosis. Patent vertebrobasilar junction and basilar artery without stenosis. Motion artifact at the tip of the basilar. Bilateral SCA and PCA origins appear stable and within normal limits. Both posterior communicating arteries are present. Bilateral PCA branches appear within normal limits. Stable antegrade flow in both ICA siphons. Motion artifact at the supraclinoid segments. No siphon stenosis. Ophthalmic and posterior communicating artery origins appear stable and normal. Patent carotid termini. Normal MCA and ACA origins. Anterior communicating artery and visible ACA branches are within normal limits. MCA M1 segments and visible bilateral MCA branches appears stable and within normal limits. IMPRESSION: 1.  No acute intracranial abnormality. Chronic right MCA perirolandic encephalomalacia related to a 2010 infarct. Occasional chronic microhemorrhage otherwise. 2. 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 Electronically Signed   By: Genevie Ann M.D.   On: 11/09/2018 14:44   Mr [place] Contrast  Result Date: 11/09/2018 CLINICAL DATA:  59 year old male presenting this morning with left side weakness x1 week and evidence of right MCA cortical infarct on plain CT. EXAM: MRI HEAD WITHOUT CONTRAST MRA HEAD WITHOUT CONTRAST  TECHNIQUE: Multiplanar, multiecho pulse sequences of the brain and surrounding structures were obtained without intravenous contrast. Angiographic images of the head were obtained using MRA technique without contrast. COMPARISON:  Head CT earlier today. Brain MRI and intracranial MRA 06/27/2009. FINDINGS: MRI HEAD FINDINGS Brain: Signal changes in the right lateral perirolandic cortex corresponding to the CT finding earlier today are chronic (series 6, image 38), and appear to correspond to an infarct which was acute on the 2010 MRI. No restricted diffusion or evidence of acute infarction. Aside from the right MCA territory encephalomalacia there are occasional chronic micro hemorrhages (right middle frontal gyrus and left occipital pole). There is mild Wallerian degeneration evident in the right brainstem. Pearline Cables and white matter signal is otherwise within normal limits. Generalized cerebral volume loss since 2010. No midline shift, mass effect, evidence of mass lesion, ventriculomegaly, extra-axial collection or acute intracranial hemorrhage. Cervicomedullary junction and pituitary are within normal limits. Vascular: Major intracranial vascular flow voids are stable. Skull and upper cervical spine: Degenerative appearing cervical spinal stenosis plus chronic nonspecific decreased T1 marrow signal in the cervical spine. Skull marrow signal is normal. Sinuses/Orbits: Negative. Other: Mastoids are clear. Visible internal auditory structures appear normal. Scalp and face soft tissues appear negative. MRA HEAD FINDINGS Chronic tortuosity of the distal vertebral arteries with stable antegrade flow in the posterior circulation. Patent PICA origins. No distal vertebral stenosis. Patent vertebrobasilar junction and basilar artery without stenosis. Motion artifact at the tip of the basilar. Bilateral SCA and PCA origins appear stable and within normal limits. Both posterior communicating arteries are present. Bilateral PCA  branches appear within normal limits. Stable antegrade flow in both ICA siphons. Motion artifact at the supraclinoid segments. No siphon stenosis. Ophthalmic and posterior communicating artery origins appear stable and normal. Patent carotid termini. Normal MCA and ACA origins. Anterior communicating artery and visible ACA branches are within normal limits. MCA M1 segments and visible bilateral MCA branches appears stable and within normal limits. IMPRESSION: 1.  No acute intracranial abnormality. Chronic right MCA perirolandic encephalomalacia related to a 2010 infarct. Occasional chronic microhemorrhage otherwise. 2. 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 Electronically Signed   By: Genevie Ann M.D.   On: 11/09/2018 14:44    Microbiology: Recent Results (from the past 240 hour(s))  MRSA PCR Screening     Status: None   Collection Time: 11/09/18  2:04 PM  Result Value Ref Range Status   MRSA by PCR NEGATIVE NEGATIVE Final    Comment:        The GeneXpert MRSA Assay (FDA approved for NASAL specimens only), is one component of a comprehensive MRSA colonization surveillance program. It is not intended to diagnose MRSA infection nor to guide or monitor treatment for MRSA infections. Performed at [facility], [place] [address], [place], [place] [address]      Labs: Basic Metabolic Panel: Recent Labs  Lab 11/09/18 0745 11/10/18 1252 11/11/18 0432  NA 138 136 135  K 4.8 4.7 3.7  CL 94* 93* 96*  CO2 27 24 29   GLUCOSE 100* 90 112*  BUN 33* 45* 19  CREATININE 9.97* 12.24* 7.72*  CALCIUM 9.7 9.2 9.3  PHOS  --  8.0*  --    Liver Function Tests: Recent Labs  Lab 11/09/18 0745 11/10/18 1252 11/11/18 0432  AST 23  --  18  ALT 15  --  13  ALKPHOS 67  --  63  BILITOT 0.9  --  0.8  PROT 6.6  --  6.6  ALBUMIN 3.4* 3.2* 3.1*   No results for input(s): LIPASE, AMYLASE in the last 168 hours. Recent Labs  Lab 11/09/18 0745  AMMONIA <9*   CBC: Recent Labs  Lab  11/09/18 0745 11/10/18 1251 11/11/18 0432  WBC 7.1 7.1 7.5  NEUTROABS 5.9  --  6.0  HGB 9.9* 9.6* 10.0*  HCT 30.4* 29.4* 31.4*  MCV 92.4 91.9 91.8  PLT 205 204 213   Cardiac Enzymes: No results for input(s): CKTOTAL, CKMB, CKMBINDEX, TROPONINI in the last 168 hours. BNP: BNP (last 3 results) No results for input(s): BNP in the last 8760 hours.  ProBNP (last 3 results) No results for input(s): PROBNP in the last 8760 hours.  CBG: Recent Labs  Lab 11/10/18 1136  GLUCAP 95       Signed:  Kayleen Memos, MD [facility] 11/11/2018, 9:10 AM

## 2018-11-11 NOTE — Discharge Instructions (Signed)

## 2018-11-11 NOTE — Evaluation (Signed)
Physical Therapy Evaluation Patient Details Name: [name] MRN: [id number] DOB: 1961/04/21 Today's Date: 11/11/2018   History of Present Illness  Pt is a 58 y/o male with PMH of CHF s/p heart transplant on immunosuppression, a fib, R sided CVA with residual L sided weakness, diabetes, HTN, ESRD on hemodialysis brough to hospital due to confusion and emotional lability. CT reveals chronic MCA infarction, MRI reveals no acute changes. Workup for toxic metabolic encepalopathy per neurology.   Clinical Impression  Pt is at or close to baseline functioning and should be safe at home . There are no further acute PT needs.  Will sign off at this time.     Follow Up Recommendations Home health PT;Supervision - Intermittent    Equipment Recommendations  None recommended by PT    Recommendations for Other Services       Precautions / Restrictions Restrictions Weight Bearing Restrictions: No      Mobility  Bed Mobility Overal bed mobility: Needs Assistance Bed Mobility: Supine to Sit     Supine to sit: Supervision     General bed mobility comments: no assist required, supervision for safety  Transfers Overall transfer level: Needs assistance Equipment used: None Transfers: Sit to/from Stand Sit to Stand: Min guard         General transfer comment: stood up well, but needed to stand still, then do some warm up movements before ready to mobilize.  Ambulation/Gait Ambulation/Gait assistance: Supervision Gait Distance (Feet): 500 Feet Assistive device: None Gait Pattern/deviations: Step-through pattern   Gait velocity interpretation: >2.62 ft/sec, indicative of community ambulatory General Gait Details: generally safe on level surfaces with neuropathy likely responsible for occasional deviations.  No overt LOB.  Pt able to significantly change speed, but not age appropriate levels.  Stairs Stairs: Yes Stairs assistance: Supervision Stair Management: One rail  Right;Alternating pattern;Forwards Number of Stairs: 3 General stair comments: safe with a rail  Wheelchair Mobility    Modified Rankin (Stroke Patients Only)       Balance Overall balance assessment: Needs assistance Sitting-balance support: No upper extremity supported;Feet supported Sitting balance-Leahy Scale: Good     Standing balance support: No upper extremity supported;During functional activity Standing balance-Leahy Scale: Fair Standing balance comment: initial unsteadiness improved somewhat after warm up.                 Standardized Balance Assessment Standardized Balance Assessment : Dynamic Gait Index   Dynamic Gait Index Level Surface: Mild Impairment Change in Gait Speed: Mild Impairment Gait with Horizontal Head Turns: Normal Gait with Vertical Head Turns: Normal Gait and Pivot Turn: Mild Impairment Step Over Obstacle: Mild Impairment Step Around Obstacles: Normal Steps: Mild Impairment Total Score: 19       Pertinent Vitals/Pain Pain Assessment: No/denies pain    Home Living Family/patient expects to be discharged to:: Private residence Living Arrangements: Spouse/significant other Available Help at Discharge: Family;Available PRN/intermittently(spouse works during daytime) Type of Home: House Home Access: Stairs to enter Entrance Stairs-Rails: Right Entrance Stairs-Number of Steps: 5 Home Layout: One level Home Equipment: Environmental consultant - 2 wheels;Cane - single point;Bedside commode      Prior Function Level of Independence: Independent         Comments: sometimes using cane if neuropathy is flared up, completes IADLs, manages medications, drives      Hand Dominance   Dominant Hand: Right    Extremity/Trunk Assessment   Upper Extremity Assessment Upper Extremity Assessment: Overall WFL for tasks assessed    Lower  Extremity Assessment Lower Extremity Assessment: Overall WFL for tasks assessed(bil hip flexors and df are mildly  weak R>L)       Communication   Communication: No difficulties  Cognition Arousal/Alertness: Awake/alert Behavior During Therapy: WFL for tasks assessed/performed Overall Cognitive Status: Impaired/Different from baseline Area of Impairment: Attention;Memory;Following commands;Safety/judgement;Awareness;Problem solving                   Current Attention Level: Sustained Memory: Decreased short-term memory;Decreased recall of precautions Following Commands: Follows one step commands consistently;Follows one step commands with increased time Safety/Judgement: Decreased awareness of safety;Decreased awareness of deficits Awareness: Intellectual Problem Solving: Slow processing;Difficulty sequencing;Decreased initiation;Requires verbal cues General Comments: Short blessed test completed: scored 12/28 (impairments noted in short term memory, sequencing, and attention).  Spouse reports notable changes in comprehension. Able to follow 1 step commands during basic self care tasks with minimal cueing.      General Comments      Exercises     Assessment/Plan    PT Assessment Patient needs continued PT services  PT Problem List Decreased balance;Decreased strength       PT Treatment Interventions      PT Goals (Current goals can be found in the Care Plan section)  Acute Rehab PT Goals Patient Stated Goal: to get home PT Goal Formulation: All assessment and education complete, DC therapy    Frequency     Barriers to discharge        Co-evaluation               AM-PAC PT "6 Clicks" Mobility  Outcome Measure Help needed turning from your back to your side while in a flat bed without using bedrails?: None Help needed moving from lying on your back to sitting on the side of a flat bed without using bedrails?: None Help needed moving to and from a bed to a chair (including a wheelchair)?: A Little Help needed standing up from a chair using your arms (e.g.,  wheelchair or bedside chair)?: A Little Help needed to walk in hospital room?: A Little Help needed climbing 3-5 steps with a railing? : A Little 6 Click Score: 20    End of Session   Activity Tolerance: Patient tolerated treatment well Patient left: in bed;Other (comment)(sitting EOB)   PT Visit Diagnosis: Unsteadiness on feet (R26.81)    Time: 3893-7342 PT Time Calculation (min) (ACUTE ONLY): 21 min   Charges:   PT Evaluation $PT Eval Low Complexity: 1 Low          11/11/2018  Donnella Sham, PT Acute Rehabilitation Services [phone]  (pager) [phone]  (office)  Tessie Fass Donnielle Addison 11/11/2018, 10:54 AM

## 2018-11-12 DIAGNOSIS — I69354 Hemiplegia and hemiparesis following cerebral infarction affecting left non-dominant side: Secondary | ICD-10-CM | POA: Diagnosis not present

## 2018-11-12 DIAGNOSIS — Z6835 Body mass index (BMI) 35.0-35.9, adult: Secondary | ICD-10-CM | POA: Diagnosis not present

## 2018-11-12 DIAGNOSIS — C61 Malignant neoplasm of prostate: Secondary | ICD-10-CM | POA: Diagnosis not present

## 2018-11-12 DIAGNOSIS — E1129 Type 2 diabetes mellitus with other diabetic kidney complication: Secondary | ICD-10-CM | POA: Diagnosis not present

## 2018-11-12 DIAGNOSIS — E1122 Type 2 diabetes mellitus with diabetic chronic kidney disease: Secondary | ICD-10-CM | POA: Diagnosis not present

## 2018-11-12 DIAGNOSIS — E1169 Type 2 diabetes mellitus with other specified complication: Secondary | ICD-10-CM | POA: Diagnosis not present

## 2018-11-12 DIAGNOSIS — E1140 Type 2 diabetes mellitus with diabetic neuropathy, unspecified: Secondary | ICD-10-CM | POA: Diagnosis not present

## 2018-11-12 DIAGNOSIS — Z941 Heart transplant status: Secondary | ICD-10-CM | POA: Diagnosis not present

## 2018-11-12 DIAGNOSIS — G4733 Obstructive sleep apnea (adult) (pediatric): Secondary | ICD-10-CM | POA: Diagnosis not present

## 2018-11-12 DIAGNOSIS — G92 Toxic encephalopathy: Secondary | ICD-10-CM | POA: Diagnosis not present

## 2018-11-12 DIAGNOSIS — Z992 Dependence on renal dialysis: Secondary | ICD-10-CM | POA: Diagnosis not present

## 2018-11-12 DIAGNOSIS — E669 Obesity, unspecified: Secondary | ICD-10-CM | POA: Diagnosis not present

## 2018-11-12 DIAGNOSIS — N186 End stage renal disease: Secondary | ICD-10-CM | POA: Diagnosis not present

## 2018-11-12 DIAGNOSIS — I120 Hypertensive chronic kidney disease with stage 5 chronic kidney disease or end stage renal disease: Secondary | ICD-10-CM | POA: Diagnosis not present

## 2018-11-12 DIAGNOSIS — D509 Iron deficiency anemia, unspecified: Secondary | ICD-10-CM | POA: Diagnosis not present

## 2018-11-12 DIAGNOSIS — N2581 Secondary hyperparathyroidism of renal origin: Secondary | ICD-10-CM | POA: Diagnosis not present

## 2018-11-12 DIAGNOSIS — Z79899 Other long term (current) drug therapy: Secondary | ICD-10-CM | POA: Diagnosis not present

## 2018-11-12 DIAGNOSIS — D631 Anemia in chronic kidney disease: Secondary | ICD-10-CM | POA: Diagnosis not present

## 2018-11-12 DIAGNOSIS — Z9884 Bariatric surgery status: Secondary | ICD-10-CM | POA: Diagnosis not present

## 2018-11-13 DIAGNOSIS — Z09 Encounter for follow-up examination after completed treatment for conditions other than malignant neoplasm: Secondary | ICD-10-CM | POA: Diagnosis not present

## 2018-11-13 DIAGNOSIS — G629 Polyneuropathy, unspecified: Secondary | ICD-10-CM | POA: Diagnosis not present

## 2018-11-13 DIAGNOSIS — G8929 Other chronic pain: Secondary | ICD-10-CM | POA: Diagnosis not present

## 2018-11-14 DIAGNOSIS — Z992 Dependence on renal dialysis: Secondary | ICD-10-CM | POA: Diagnosis not present

## 2018-11-14 DIAGNOSIS — D631 Anemia in chronic kidney disease: Secondary | ICD-10-CM | POA: Diagnosis not present

## 2018-11-14 DIAGNOSIS — E1129 Type 2 diabetes mellitus with other diabetic kidney complication: Secondary | ICD-10-CM | POA: Diagnosis not present

## 2018-11-14 DIAGNOSIS — N2581 Secondary hyperparathyroidism of renal origin: Secondary | ICD-10-CM | POA: Diagnosis not present

## 2018-11-14 DIAGNOSIS — D509 Iron deficiency anemia, unspecified: Secondary | ICD-10-CM | POA: Diagnosis not present

## 2018-11-14 DIAGNOSIS — N186 End stage renal disease: Secondary | ICD-10-CM | POA: Diagnosis not present

## 2018-11-17 ENCOUNTER — Telehealth: Payer: Self-pay | Admitting: *Deleted

## 2018-11-17 DIAGNOSIS — N186 End stage renal disease: Secondary | ICD-10-CM | POA: Diagnosis not present

## 2018-11-17 DIAGNOSIS — Z992 Dependence on renal dialysis: Secondary | ICD-10-CM | POA: Diagnosis not present

## 2018-11-17 DIAGNOSIS — D509 Iron deficiency anemia, unspecified: Secondary | ICD-10-CM | POA: Diagnosis not present

## 2018-11-17 DIAGNOSIS — D631 Anemia in chronic kidney disease: Secondary | ICD-10-CM | POA: Diagnosis not present

## 2018-11-17 DIAGNOSIS — N2581 Secondary hyperparathyroidism of renal origin: Secondary | ICD-10-CM | POA: Diagnosis not present

## 2018-11-17 DIAGNOSIS — E1129 Type 2 diabetes mellitus with other diabetic kidney complication: Secondary | ICD-10-CM | POA: Diagnosis not present

## 2018-11-17 NOTE — Telephone Encounter (Signed)
On 11-17-18 fax medical records [facility] , it was note from 10-08-18.

## 2018-11-19 DIAGNOSIS — D631 Anemia in chronic kidney disease: Secondary | ICD-10-CM | POA: Diagnosis not present

## 2018-11-19 DIAGNOSIS — D509 Iron deficiency anemia, unspecified: Secondary | ICD-10-CM | POA: Diagnosis not present

## 2018-11-19 DIAGNOSIS — N2581 Secondary hyperparathyroidism of renal origin: Secondary | ICD-10-CM | POA: Diagnosis not present

## 2018-11-19 DIAGNOSIS — E1129 Type 2 diabetes mellitus with other diabetic kidney complication: Secondary | ICD-10-CM | POA: Diagnosis not present

## 2018-11-19 DIAGNOSIS — Z992 Dependence on renal dialysis: Secondary | ICD-10-CM | POA: Diagnosis not present

## 2018-11-19 DIAGNOSIS — N186 End stage renal disease: Secondary | ICD-10-CM | POA: Diagnosis not present

## 2018-11-21 DIAGNOSIS — D631 Anemia in chronic kidney disease: Secondary | ICD-10-CM | POA: Diagnosis not present

## 2018-11-21 DIAGNOSIS — N186 End stage renal disease: Secondary | ICD-10-CM | POA: Diagnosis not present

## 2018-11-21 DIAGNOSIS — D509 Iron deficiency anemia, unspecified: Secondary | ICD-10-CM | POA: Diagnosis not present

## 2018-11-21 DIAGNOSIS — Z992 Dependence on renal dialysis: Secondary | ICD-10-CM | POA: Diagnosis not present

## 2018-11-21 DIAGNOSIS — E1129 Type 2 diabetes mellitus with other diabetic kidney complication: Secondary | ICD-10-CM | POA: Diagnosis not present

## 2018-11-21 DIAGNOSIS — N2581 Secondary hyperparathyroidism of renal origin: Secondary | ICD-10-CM | POA: Diagnosis not present

## 2018-11-24 DIAGNOSIS — D509 Iron deficiency anemia, unspecified: Secondary | ICD-10-CM | POA: Diagnosis not present

## 2018-11-24 DIAGNOSIS — E1129 Type 2 diabetes mellitus with other diabetic kidney complication: Secondary | ICD-10-CM | POA: Diagnosis not present

## 2018-11-24 DIAGNOSIS — N2581 Secondary hyperparathyroidism of renal origin: Secondary | ICD-10-CM | POA: Diagnosis not present

## 2018-11-24 DIAGNOSIS — D631 Anemia in chronic kidney disease: Secondary | ICD-10-CM | POA: Diagnosis not present

## 2018-11-24 DIAGNOSIS — N186 End stage renal disease: Secondary | ICD-10-CM | POA: Diagnosis not present

## 2018-11-24 DIAGNOSIS — Z992 Dependence on renal dialysis: Secondary | ICD-10-CM | POA: Diagnosis not present

## 2018-11-26 DIAGNOSIS — D509 Iron deficiency anemia, unspecified: Secondary | ICD-10-CM | POA: Diagnosis not present

## 2018-11-26 DIAGNOSIS — N186 End stage renal disease: Secondary | ICD-10-CM | POA: Diagnosis not present

## 2018-11-26 DIAGNOSIS — E1129 Type 2 diabetes mellitus with other diabetic kidney complication: Secondary | ICD-10-CM | POA: Diagnosis not present

## 2018-11-26 DIAGNOSIS — T8620 Unspecified complication of heart transplant: Secondary | ICD-10-CM | POA: Diagnosis not present

## 2018-11-26 DIAGNOSIS — D631 Anemia in chronic kidney disease: Secondary | ICD-10-CM | POA: Diagnosis not present

## 2018-11-26 DIAGNOSIS — Z992 Dependence on renal dialysis: Secondary | ICD-10-CM | POA: Diagnosis not present

## 2018-11-26 DIAGNOSIS — N2581 Secondary hyperparathyroidism of renal origin: Secondary | ICD-10-CM | POA: Diagnosis not present

## 2018-11-28 DIAGNOSIS — D509 Iron deficiency anemia, unspecified: Secondary | ICD-10-CM | POA: Diagnosis not present

## 2018-11-28 DIAGNOSIS — D631 Anemia in chronic kidney disease: Secondary | ICD-10-CM | POA: Diagnosis not present

## 2018-11-28 DIAGNOSIS — E1129 Type 2 diabetes mellitus with other diabetic kidney complication: Secondary | ICD-10-CM | POA: Diagnosis not present

## 2018-11-28 DIAGNOSIS — N186 End stage renal disease: Secondary | ICD-10-CM | POA: Diagnosis not present

## 2018-11-28 DIAGNOSIS — Z992 Dependence on renal dialysis: Secondary | ICD-10-CM | POA: Diagnosis not present

## 2018-11-28 DIAGNOSIS — N2581 Secondary hyperparathyroidism of renal origin: Secondary | ICD-10-CM | POA: Diagnosis not present

## 2018-12-01 DIAGNOSIS — D509 Iron deficiency anemia, unspecified: Secondary | ICD-10-CM | POA: Diagnosis not present

## 2018-12-01 DIAGNOSIS — D631 Anemia in chronic kidney disease: Secondary | ICD-10-CM | POA: Diagnosis not present

## 2018-12-01 DIAGNOSIS — N2581 Secondary hyperparathyroidism of renal origin: Secondary | ICD-10-CM | POA: Diagnosis not present

## 2018-12-01 DIAGNOSIS — E1129 Type 2 diabetes mellitus with other diabetic kidney complication: Secondary | ICD-10-CM | POA: Diagnosis not present

## 2018-12-01 DIAGNOSIS — N186 End stage renal disease: Secondary | ICD-10-CM | POA: Diagnosis not present

## 2018-12-01 DIAGNOSIS — Z992 Dependence on renal dialysis: Secondary | ICD-10-CM | POA: Diagnosis not present

## 2018-12-03 DIAGNOSIS — D509 Iron deficiency anemia, unspecified: Secondary | ICD-10-CM | POA: Diagnosis not present

## 2018-12-03 DIAGNOSIS — Z992 Dependence on renal dialysis: Secondary | ICD-10-CM | POA: Diagnosis not present

## 2018-12-03 DIAGNOSIS — N186 End stage renal disease: Secondary | ICD-10-CM | POA: Diagnosis not present

## 2018-12-03 DIAGNOSIS — E1129 Type 2 diabetes mellitus with other diabetic kidney complication: Secondary | ICD-10-CM | POA: Diagnosis not present

## 2018-12-03 DIAGNOSIS — N2581 Secondary hyperparathyroidism of renal origin: Secondary | ICD-10-CM | POA: Diagnosis not present

## 2018-12-03 DIAGNOSIS — D631 Anemia in chronic kidney disease: Secondary | ICD-10-CM | POA: Diagnosis not present

## 2018-12-05 DIAGNOSIS — Z992 Dependence on renal dialysis: Secondary | ICD-10-CM | POA: Diagnosis not present

## 2018-12-05 DIAGNOSIS — N2581 Secondary hyperparathyroidism of renal origin: Secondary | ICD-10-CM | POA: Diagnosis not present

## 2018-12-05 DIAGNOSIS — N186 End stage renal disease: Secondary | ICD-10-CM | POA: Diagnosis not present

## 2018-12-05 DIAGNOSIS — E1129 Type 2 diabetes mellitus with other diabetic kidney complication: Secondary | ICD-10-CM | POA: Diagnosis not present

## 2018-12-05 DIAGNOSIS — D509 Iron deficiency anemia, unspecified: Secondary | ICD-10-CM | POA: Diagnosis not present

## 2018-12-05 DIAGNOSIS — D631 Anemia in chronic kidney disease: Secondary | ICD-10-CM | POA: Diagnosis not present

## 2018-12-08 DIAGNOSIS — D631 Anemia in chronic kidney disease: Secondary | ICD-10-CM | POA: Diagnosis not present

## 2018-12-08 DIAGNOSIS — N186 End stage renal disease: Secondary | ICD-10-CM | POA: Diagnosis not present

## 2018-12-08 DIAGNOSIS — N2581 Secondary hyperparathyroidism of renal origin: Secondary | ICD-10-CM | POA: Diagnosis not present

## 2018-12-08 DIAGNOSIS — E1129 Type 2 diabetes mellitus with other diabetic kidney complication: Secondary | ICD-10-CM | POA: Diagnosis not present

## 2018-12-08 DIAGNOSIS — D509 Iron deficiency anemia, unspecified: Secondary | ICD-10-CM | POA: Diagnosis not present

## 2018-12-08 DIAGNOSIS — Z992 Dependence on renal dialysis: Secondary | ICD-10-CM | POA: Diagnosis not present

## 2018-12-10 DIAGNOSIS — D631 Anemia in chronic kidney disease: Secondary | ICD-10-CM | POA: Diagnosis not present

## 2018-12-10 DIAGNOSIS — D509 Iron deficiency anemia, unspecified: Secondary | ICD-10-CM | POA: Diagnosis not present

## 2018-12-10 DIAGNOSIS — N2581 Secondary hyperparathyroidism of renal origin: Secondary | ICD-10-CM | POA: Diagnosis not present

## 2018-12-10 DIAGNOSIS — Z992 Dependence on renal dialysis: Secondary | ICD-10-CM | POA: Diagnosis not present

## 2018-12-10 DIAGNOSIS — N186 End stage renal disease: Secondary | ICD-10-CM | POA: Diagnosis not present

## 2018-12-10 DIAGNOSIS — E1129 Type 2 diabetes mellitus with other diabetic kidney complication: Secondary | ICD-10-CM | POA: Diagnosis not present

## 2018-12-11 ENCOUNTER — Other Ambulatory Visit: Payer: Self-pay

## 2018-12-11 ENCOUNTER — Ambulatory Visit (INDEPENDENT_AMBULATORY_CARE_PROVIDER_SITE_OTHER): Payer: Medicare Other | Admitting: Neurology

## 2018-12-11 ENCOUNTER — Encounter: Payer: Self-pay | Admitting: Neurology

## 2018-12-11 DIAGNOSIS — G6289 Other specified polyneuropathies: Secondary | ICD-10-CM

## 2018-12-11 DIAGNOSIS — G629 Polyneuropathy, unspecified: Secondary | ICD-10-CM | POA: Insufficient documentation

## 2018-12-11 MED ORDER — OXCARBAZEPINE 150 MG PO TABS: 150.0000 mg | ORAL_TABLET | Freq: Two times a day (BID) | ORAL | 11 refills | Status: DC

## 2018-12-11 NOTE — Progress Notes (Signed)
PATIENT: [name] DOB: [birth year]  Virtual Visit via Video  I connected with Esperanza Sheets on 12/11/18 at  by Video and verified that I am speaking with the correct person using two identifiers.   I discussed the limitations, risks, security and privacy concerns of performing an evaluation and management service by video and the availability of in person appointments. I also discussed with the patient that there may be a patient responsible charge related to this service. The patient expressed understanding and agreed to proceed.  HISTORICAL  [name] is a 58 year old male, seen in request by his primary care physician Dr. Tamala Julian, [facility] for evaluation of peripheral neuropathy  I have reviewed and summarized the referring note from the referring physician.  He has complicated past medical history, hypertension, diabetes, previous obesity, had bariatric surgery, prostate cancer, being treated with 5 alpha reductase inhibitor, radiation therapy.  I also reviewed multiple [facility] record, patient was diagnosed with nonischemic cardiomyopathy in 2009, prior to the diagnosis, he underwent lap band placement lost about 100 pounds of weight, he was type 2 diabetes, came up off his diabetic medication, he had heart transplant in 2014, develop end-stage renal failure as a result of acute kidney injury from heart transplant and calcineurin toxicity, he was started on hemodialysis since December 2014, receiving hemodialysis Monday Wednesday Friday, is on the list for kidney transplant  Patient reported mild bilateral toes paresthesia since his heart transplant, and initiation of hemodialysis, significantly worsened since 2018, within 2 months of time, he noticed quick ascending paresthesia starting from bilateral toes, feet to bilateral calf, knee level, then bilateral fingertips involvement, he described constant pins and needle sensation, painful to the point of hearing, despite Lyrica 100 mg 3  times daily treatment, previously tried and failed gabapentin, Cymbalta caused increasing dreams, he has mild unsteady gait because bilateral feet pain, has to look down at his feet to certain his position.  He denies significant bilateral hands or upper extremity weakness.   He is off diabetic medications now, denies bowel and bladder incontinence   Observations/Objective: I have reviewed problem lists, medications, allergies.  Awake and alert oriented to history taking care of conversation, obesity, able to get up from seated position pushing on chair arm, wide-based,  unsteady, mild difficulty perform tiptoe and heel walking, fairly steady tandem walking,  Laboratory evaluations in 2020, creatinine 7.72, potassium 3.7, CBC hemoglobin of 10, hepatitis B surface antibody negative, normal TSH 1.03, negative HIV, elevated parathyroid 267, ferritin 870, transferrin low 184, iron low 33, normal vitamin B1 185, negative hepatitis ABC, VZV antibody,  Assessment and Plan: Peripheral neuropathy  In the setting of complicated past medical history, including cardiac transplant in 2014, end-stage renal disease, hemodialysis since December 2014, previous history of obesity, diabetes,  EMG nerve conduction study  He complains of significant neuropathic pain despite Lyrica 100 mg 3 times daily, Cymbalta 60 mg daily, add on Trileptal 150 twice a day  Follow Up Instructions:   In 41-month   I discussed the assessment and treatment plan with the patient. The patient was provided an opportunity to ask questions and all were answered. The patient agreed with the plan and demonstrated an understanding of the instructions.   The patient was advised to call back or seek an in-person evaluation if the symptoms worsen or if the condition fails to improve as anticipated.  I provided 45 minutes of non-face-to-face time during this encounter.  REVIEW OF SYSTEMS: Full 14 system review  of systems performed and notable  only for as above All other review of systems were negative.  ALLERGIES: Allergies  Allergen Reactions  . Heparin Nausea Only, Swelling and Other (See Comments)     * * HIT * * SWELLING REACTION UNSPECIFIED  DIAPHORESIS  . Penicillin G Potassium [Penicillin G] Nausea Only and Other (See Comments)    DIAPHORESIS    HOME MEDICATIONS: Current Outpatient Medications  Medication Sig Dispense Refill  . aspirin 81 MG chewable tablet Chew 1 tablet (81 mg total) by mouth 2 (two) times daily. (Patient taking differently: Chew 81 mg by mouth daily. ) 60 tablet 1  . B Complex-C-Folic Acid (RENO CAPS) 1 MG CAPS Take 1 capsule by mouth daily.  3  . docusate sodium (COLACE) 100 MG capsule Take 100 mg by mouth 2 (two) times daily.    . DULoxetine (CYMBALTA) 30 MG capsule Take 60 mg by mouth daily.    . mycophenolate (MYFORTIC) 360 MG TBEC EC tablet Take 720 mg by mouth 2 (two) times daily.    Marland Kitchen omeprazole (PRILOSEC) 20 MG capsule Take 20 mg by mouth daily.     . ondansetron (ZOFRAN) 4 MG tablet Take 1 tablet (4 mg total) by mouth every 6 (six) hours as needed for nausea. (Patient taking differently: Take 4 mg by mouth daily. ) 20 tablet 0  . pravastatin (PRAVACHOL) 40 MG tablet Take 1 tablet (40 mg total) by mouth daily at 6 PM. (Patient taking differently: Take 20 mg by mouth at bedtime. ) 30 tablet 5  . pregabalin (LYRICA) 100 MG capsule Take 100 mg by mouth 3 (three) times daily.    Marland Kitchen senna (SENOKOT) 8.6 MG TABS tablet Take 2 tablets (17.2 mg total) by mouth at bedtime. (Patient taking differently: Take 2 tablets by mouth daily as needed for mild constipation. ) 120 each 0  . sevelamer carbonate (RENVELA) 800 MG tablet Take 2 tablets (1,600 mg total) by mouth 3 (three) times daily with meals. (Patient taking differently: Take 800 mg by mouth 3 (three) times daily with meals. ) 120 tablet 0  . temazepam (RESTORIL) 15 MG capsule Take 15 mg by mouth at bedtime as needed for sleep.     No current  facility-administered medications for this visit.     PAST MEDICAL HISTORY: Past Medical History:  Diagnosis Date  . Arthritis   . Atrial fibrillation ([place])   . CHF (congestive heart failure), NYHA class III (HCC)    s/p heart transplant  . Chronic kidney disease    end stage  . Chronic systolic dysfunction of left ventricle   . CVA (cerebral infarction)   . Diabetes mellitus    PMH; Prior to heart transplant  . Family history of coronary artery disease    in both parents  . GERD (gastroesophageal reflux disease)   . Hyperlipidemia   . Hypertension   . Left bundle branch block   . Morbid obesity ([place])    status post lap band  . Myocardial infarction [facility])    prior to heart transplant  . Nonischemic cardiomyopathy ([place])    prior to heart transplant  . Obesity (BMI 30-39.9)   . Obstructive sleep apnea    no longer needs CPAP after heart transplant per pt  . Premature ventricular contractions   . Prostate cancer ([place])   . SOB (shortness of breath)     PAST SURGICAL HISTORY: Past Surgical History:  Procedure Laterality Date  . CARDIAC PACEMAKER PLACEMENT  09/21/2009  Biventricular implantable cardioverter-defibrillator implantation     . COLONOSCOPY W/ BIOPSIES AND POLYPECTOMY    . FOOT SURGERY     left  . HEART TRANSPLANT  2014  . LAPAROSCOPIC GASTRIC BANDING  01/27/2007  . LEFT VENTRICULAR ASSIST DEVICE     implanted at [facility]  . PROSTATE BIOPSY    . TOTAL HIP ARTHROPLASTY Right 07/22/2017   Procedure: RIGHT TOTAL HIP ARTHROPLASTY ANTERIOR APPROACH;  Surgeon: Rod Can, MD;  Location: [place];  Service: Orthopedics;  Laterality: Right;  Needs RNFA  . TOTAL KNEE ARTHROPLASTY Right 07/22/2017    FAMILY HISTORY: Family History  Problem Relation Age of Onset  . Coronary artery disease Father        had, PTCA & CABG  . Heart attack Father   . Hypertension Father   . Diabetes Father   . Prostate cancer Father   . Coronary artery disease Mother   . Heart  attack Mother   . Hypertension Mother   . Stroke Mother   . Lupus Mother   . Prostate cancer Brother   . Coronary artery disease Brother   . Coronary artery disease Sister   . Heart attack Sister   . Prostate cancer Paternal Uncle   . Coronary artery disease Maternal Grandmother   . Coronary artery disease Maternal Grandfather   . Coronary artery disease Paternal Grandmother   . Coronary artery disease Paternal Grandfather     SOCIAL HISTORY:   Social History   Socioeconomic History  . Marital status: Married    Spouse name: Nicki Reaper  . Number of children: 1  . Years of education: 61  . Highest education level: Not on file  Occupational History  . Occupation: retired    Fish farm manager: [employer]  . Financial resource strain: Not on file  . Food insecurity:    Worry: Not on file    Inability: Not on file  . Transportation needs:    Medical: Not on file    Non-medical: Not on file  Tobacco Use  . Smoking status: Never Smoker  . Smokeless tobacco: Former Systems developer    Types: Snuff  Substance and Sexual Activity  . Alcohol use: No  . Drug use: No  . Sexual activity: Not Currently  Lifestyle  . Physical activity:    Days per week: Not on file    Minutes per session: Not on file  . Stress: Not on file  Relationships  . Social connections:    Talks on phone: Not on file    Gets together: Not on file    Attends religious service: Not on file    Active member of club or organization: Not on file    Attends meetings of clubs or organizations: Not on file    Relationship status: Not on file  . Intimate partner violence:    Fear of current or ex partner: Not on file    Emotionally abused: Not on file    Physically abused: Not on file    Forced sexual activity: Not on file  Other Topics Concern  . Not on file  Social History Narrative   Lives with wife in a one story home.  Has one child.     Retired from the [employer].  Supervisor over  transportation.    Education: college.     Marcial Pacas, M.D. Ph.D.  [facility] [address] [place], [place] [address] Ph: [phone] Fax: [phone]  CC:  Carol Ada, MD

## 2018-12-12 DIAGNOSIS — N186 End stage renal disease: Secondary | ICD-10-CM | POA: Diagnosis not present

## 2018-12-12 DIAGNOSIS — E1129 Type 2 diabetes mellitus with other diabetic kidney complication: Secondary | ICD-10-CM | POA: Diagnosis not present

## 2018-12-12 DIAGNOSIS — N2581 Secondary hyperparathyroidism of renal origin: Secondary | ICD-10-CM | POA: Diagnosis not present

## 2018-12-12 DIAGNOSIS — D509 Iron deficiency anemia, unspecified: Secondary | ICD-10-CM | POA: Diagnosis not present

## 2018-12-12 DIAGNOSIS — D631 Anemia in chronic kidney disease: Secondary | ICD-10-CM | POA: Diagnosis not present

## 2018-12-12 DIAGNOSIS — Z992 Dependence on renal dialysis: Secondary | ICD-10-CM | POA: Diagnosis not present

## 2018-12-15 ENCOUNTER — Telehealth: Payer: Self-pay | Admitting: Neurology

## 2018-12-15 DIAGNOSIS — Z992 Dependence on renal dialysis: Secondary | ICD-10-CM | POA: Diagnosis not present

## 2018-12-15 DIAGNOSIS — N186 End stage renal disease: Secondary | ICD-10-CM | POA: Diagnosis not present

## 2018-12-15 DIAGNOSIS — D509 Iron deficiency anemia, unspecified: Secondary | ICD-10-CM | POA: Diagnosis not present

## 2018-12-15 DIAGNOSIS — D631 Anemia in chronic kidney disease: Secondary | ICD-10-CM | POA: Diagnosis not present

## 2018-12-15 DIAGNOSIS — N2581 Secondary hyperparathyroidism of renal origin: Secondary | ICD-10-CM | POA: Diagnosis not present

## 2018-12-15 DIAGNOSIS — E1129 Type 2 diabetes mellitus with other diabetic kidney complication: Secondary | ICD-10-CM | POA: Diagnosis not present

## 2018-12-15 NOTE — Telephone Encounter (Signed)
LVM to schedule 3 month follow-up and NCV/EMG per Dr. Krista Blue.

## 2018-12-17 DIAGNOSIS — N2581 Secondary hyperparathyroidism of renal origin: Secondary | ICD-10-CM | POA: Diagnosis not present

## 2018-12-17 DIAGNOSIS — E1129 Type 2 diabetes mellitus with other diabetic kidney complication: Secondary | ICD-10-CM | POA: Diagnosis not present

## 2018-12-17 DIAGNOSIS — N186 End stage renal disease: Secondary | ICD-10-CM | POA: Diagnosis not present

## 2018-12-17 DIAGNOSIS — D509 Iron deficiency anemia, unspecified: Secondary | ICD-10-CM | POA: Diagnosis not present

## 2018-12-17 DIAGNOSIS — D631 Anemia in chronic kidney disease: Secondary | ICD-10-CM | POA: Diagnosis not present

## 2018-12-17 DIAGNOSIS — Z992 Dependence on renal dialysis: Secondary | ICD-10-CM | POA: Diagnosis not present

## 2018-12-19 DIAGNOSIS — N2581 Secondary hyperparathyroidism of renal origin: Secondary | ICD-10-CM | POA: Diagnosis not present

## 2018-12-19 DIAGNOSIS — D509 Iron deficiency anemia, unspecified: Secondary | ICD-10-CM | POA: Diagnosis not present

## 2018-12-19 DIAGNOSIS — D631 Anemia in chronic kidney disease: Secondary | ICD-10-CM | POA: Diagnosis not present

## 2018-12-19 DIAGNOSIS — N186 End stage renal disease: Secondary | ICD-10-CM | POA: Diagnosis not present

## 2018-12-19 DIAGNOSIS — E1129 Type 2 diabetes mellitus with other diabetic kidney complication: Secondary | ICD-10-CM | POA: Diagnosis not present

## 2018-12-19 DIAGNOSIS — Z992 Dependence on renal dialysis: Secondary | ICD-10-CM | POA: Diagnosis not present

## 2018-12-22 DIAGNOSIS — N2581 Secondary hyperparathyroidism of renal origin: Secondary | ICD-10-CM | POA: Diagnosis not present

## 2018-12-22 DIAGNOSIS — Z992 Dependence on renal dialysis: Secondary | ICD-10-CM | POA: Diagnosis not present

## 2018-12-22 DIAGNOSIS — N186 End stage renal disease: Secondary | ICD-10-CM | POA: Diagnosis not present

## 2018-12-22 DIAGNOSIS — E1129 Type 2 diabetes mellitus with other diabetic kidney complication: Secondary | ICD-10-CM | POA: Diagnosis not present

## 2018-12-22 DIAGNOSIS — D509 Iron deficiency anemia, unspecified: Secondary | ICD-10-CM | POA: Diagnosis not present

## 2018-12-22 DIAGNOSIS — D631 Anemia in chronic kidney disease: Secondary | ICD-10-CM | POA: Diagnosis not present

## 2018-12-24 DIAGNOSIS — E1129 Type 2 diabetes mellitus with other diabetic kidney complication: Secondary | ICD-10-CM | POA: Diagnosis not present

## 2018-12-24 DIAGNOSIS — D631 Anemia in chronic kidney disease: Secondary | ICD-10-CM | POA: Diagnosis not present

## 2018-12-24 DIAGNOSIS — N186 End stage renal disease: Secondary | ICD-10-CM | POA: Diagnosis not present

## 2018-12-24 DIAGNOSIS — Z992 Dependence on renal dialysis: Secondary | ICD-10-CM | POA: Diagnosis not present

## 2018-12-24 DIAGNOSIS — N2581 Secondary hyperparathyroidism of renal origin: Secondary | ICD-10-CM | POA: Diagnosis not present

## 2018-12-24 DIAGNOSIS — D509 Iron deficiency anemia, unspecified: Secondary | ICD-10-CM | POA: Diagnosis not present

## 2018-12-26 DIAGNOSIS — D631 Anemia in chronic kidney disease: Secondary | ICD-10-CM | POA: Diagnosis not present

## 2018-12-26 DIAGNOSIS — T8620 Unspecified complication of heart transplant: Secondary | ICD-10-CM | POA: Diagnosis not present

## 2018-12-26 DIAGNOSIS — N186 End stage renal disease: Secondary | ICD-10-CM | POA: Diagnosis not present

## 2018-12-26 DIAGNOSIS — N2581 Secondary hyperparathyroidism of renal origin: Secondary | ICD-10-CM | POA: Diagnosis not present

## 2018-12-26 DIAGNOSIS — Z992 Dependence on renal dialysis: Secondary | ICD-10-CM | POA: Diagnosis not present

## 2018-12-29 DIAGNOSIS — N2581 Secondary hyperparathyroidism of renal origin: Secondary | ICD-10-CM | POA: Diagnosis not present

## 2018-12-29 DIAGNOSIS — N186 End stage renal disease: Secondary | ICD-10-CM | POA: Diagnosis not present

## 2018-12-29 DIAGNOSIS — D631 Anemia in chronic kidney disease: Secondary | ICD-10-CM | POA: Diagnosis not present

## 2018-12-31 DIAGNOSIS — N2581 Secondary hyperparathyroidism of renal origin: Secondary | ICD-10-CM | POA: Diagnosis not present

## 2018-12-31 DIAGNOSIS — D631 Anemia in chronic kidney disease: Secondary | ICD-10-CM | POA: Diagnosis not present

## 2018-12-31 DIAGNOSIS — N186 End stage renal disease: Secondary | ICD-10-CM | POA: Diagnosis not present

## 2019-01-02 DIAGNOSIS — D631 Anemia in chronic kidney disease: Secondary | ICD-10-CM | POA: Diagnosis not present

## 2019-01-02 DIAGNOSIS — N2581 Secondary hyperparathyroidism of renal origin: Secondary | ICD-10-CM | POA: Diagnosis not present

## 2019-01-02 DIAGNOSIS — N186 End stage renal disease: Secondary | ICD-10-CM | POA: Diagnosis not present

## 2019-01-05 DIAGNOSIS — D631 Anemia in chronic kidney disease: Secondary | ICD-10-CM | POA: Diagnosis not present

## 2019-01-05 DIAGNOSIS — N2581 Secondary hyperparathyroidism of renal origin: Secondary | ICD-10-CM | POA: Diagnosis not present

## 2019-01-05 DIAGNOSIS — N186 End stage renal disease: Secondary | ICD-10-CM | POA: Diagnosis not present

## 2019-01-07 DIAGNOSIS — D631 Anemia in chronic kidney disease: Secondary | ICD-10-CM | POA: Diagnosis not present

## 2019-01-07 DIAGNOSIS — N2581 Secondary hyperparathyroidism of renal origin: Secondary | ICD-10-CM | POA: Diagnosis not present

## 2019-01-07 DIAGNOSIS — N186 End stage renal disease: Secondary | ICD-10-CM | POA: Diagnosis not present

## 2019-01-09 DIAGNOSIS — N186 End stage renal disease: Secondary | ICD-10-CM | POA: Diagnosis not present

## 2019-01-09 DIAGNOSIS — D631 Anemia in chronic kidney disease: Secondary | ICD-10-CM | POA: Diagnosis not present

## 2019-01-09 DIAGNOSIS — N2581 Secondary hyperparathyroidism of renal origin: Secondary | ICD-10-CM | POA: Diagnosis not present

## 2019-01-12 DIAGNOSIS — D631 Anemia in chronic kidney disease: Secondary | ICD-10-CM | POA: Diagnosis not present

## 2019-01-12 DIAGNOSIS — N186 End stage renal disease: Secondary | ICD-10-CM | POA: Diagnosis not present

## 2019-01-12 DIAGNOSIS — N2581 Secondary hyperparathyroidism of renal origin: Secondary | ICD-10-CM | POA: Diagnosis not present

## 2019-01-14 DIAGNOSIS — N2581 Secondary hyperparathyroidism of renal origin: Secondary | ICD-10-CM | POA: Diagnosis not present

## 2019-01-14 DIAGNOSIS — N186 End stage renal disease: Secondary | ICD-10-CM | POA: Diagnosis not present

## 2019-01-14 DIAGNOSIS — D631 Anemia in chronic kidney disease: Secondary | ICD-10-CM | POA: Diagnosis not present

## 2019-01-16 DIAGNOSIS — N186 End stage renal disease: Secondary | ICD-10-CM | POA: Diagnosis not present

## 2019-01-16 DIAGNOSIS — D631 Anemia in chronic kidney disease: Secondary | ICD-10-CM | POA: Diagnosis not present

## 2019-01-16 DIAGNOSIS — N2581 Secondary hyperparathyroidism of renal origin: Secondary | ICD-10-CM | POA: Diagnosis not present

## 2019-01-19 DIAGNOSIS — N2581 Secondary hyperparathyroidism of renal origin: Secondary | ICD-10-CM | POA: Diagnosis not present

## 2019-01-19 DIAGNOSIS — N186 End stage renal disease: Secondary | ICD-10-CM | POA: Diagnosis not present

## 2019-01-21 DIAGNOSIS — N2581 Secondary hyperparathyroidism of renal origin: Secondary | ICD-10-CM | POA: Diagnosis not present

## 2019-01-21 DIAGNOSIS — N186 End stage renal disease: Secondary | ICD-10-CM | POA: Diagnosis not present

## 2019-01-23 DIAGNOSIS — N2581 Secondary hyperparathyroidism of renal origin: Secondary | ICD-10-CM | POA: Diagnosis not present

## 2019-01-23 DIAGNOSIS — N186 End stage renal disease: Secondary | ICD-10-CM | POA: Diagnosis not present

## 2019-01-26 DIAGNOSIS — D631 Anemia in chronic kidney disease: Secondary | ICD-10-CM | POA: Diagnosis not present

## 2019-01-26 DIAGNOSIS — E1129 Type 2 diabetes mellitus with other diabetic kidney complication: Secondary | ICD-10-CM | POA: Diagnosis not present

## 2019-01-26 DIAGNOSIS — N186 End stage renal disease: Secondary | ICD-10-CM | POA: Diagnosis not present

## 2019-01-26 DIAGNOSIS — Z992 Dependence on renal dialysis: Secondary | ICD-10-CM | POA: Diagnosis not present

## 2019-01-26 DIAGNOSIS — N2581 Secondary hyperparathyroidism of renal origin: Secondary | ICD-10-CM | POA: Diagnosis not present

## 2019-01-26 DIAGNOSIS — T8620 Unspecified complication of heart transplant: Secondary | ICD-10-CM | POA: Diagnosis not present

## 2019-01-28 DIAGNOSIS — N2581 Secondary hyperparathyroidism of renal origin: Secondary | ICD-10-CM | POA: Diagnosis not present

## 2019-01-28 DIAGNOSIS — N186 End stage renal disease: Secondary | ICD-10-CM | POA: Diagnosis not present

## 2019-01-28 DIAGNOSIS — D631 Anemia in chronic kidney disease: Secondary | ICD-10-CM | POA: Diagnosis not present

## 2019-01-28 DIAGNOSIS — E1129 Type 2 diabetes mellitus with other diabetic kidney complication: Secondary | ICD-10-CM | POA: Diagnosis not present

## 2019-01-28 DIAGNOSIS — Z992 Dependence on renal dialysis: Secondary | ICD-10-CM | POA: Diagnosis not present

## 2019-01-30 DIAGNOSIS — D631 Anemia in chronic kidney disease: Secondary | ICD-10-CM | POA: Diagnosis not present

## 2019-01-30 DIAGNOSIS — E1129 Type 2 diabetes mellitus with other diabetic kidney complication: Secondary | ICD-10-CM | POA: Diagnosis not present

## 2019-01-30 DIAGNOSIS — N2581 Secondary hyperparathyroidism of renal origin: Secondary | ICD-10-CM | POA: Diagnosis not present

## 2019-01-30 DIAGNOSIS — N186 End stage renal disease: Secondary | ICD-10-CM | POA: Diagnosis not present

## 2019-01-30 DIAGNOSIS — Z992 Dependence on renal dialysis: Secondary | ICD-10-CM | POA: Diagnosis not present

## 2019-02-02 DIAGNOSIS — D631 Anemia in chronic kidney disease: Secondary | ICD-10-CM | POA: Diagnosis not present

## 2019-02-02 DIAGNOSIS — Z992 Dependence on renal dialysis: Secondary | ICD-10-CM | POA: Diagnosis not present

## 2019-02-02 DIAGNOSIS — E1129 Type 2 diabetes mellitus with other diabetic kidney complication: Secondary | ICD-10-CM | POA: Diagnosis not present

## 2019-02-02 DIAGNOSIS — N2581 Secondary hyperparathyroidism of renal origin: Secondary | ICD-10-CM | POA: Diagnosis not present

## 2019-02-02 DIAGNOSIS — N186 End stage renal disease: Secondary | ICD-10-CM | POA: Diagnosis not present

## 2019-02-04 DIAGNOSIS — N2581 Secondary hyperparathyroidism of renal origin: Secondary | ICD-10-CM | POA: Diagnosis not present

## 2019-02-04 DIAGNOSIS — D631 Anemia in chronic kidney disease: Secondary | ICD-10-CM | POA: Diagnosis not present

## 2019-02-04 DIAGNOSIS — E1129 Type 2 diabetes mellitus with other diabetic kidney complication: Secondary | ICD-10-CM | POA: Diagnosis not present

## 2019-02-04 DIAGNOSIS — N186 End stage renal disease: Secondary | ICD-10-CM | POA: Diagnosis not present

## 2019-02-04 DIAGNOSIS — Z992 Dependence on renal dialysis: Secondary | ICD-10-CM | POA: Diagnosis not present

## 2019-02-06 DIAGNOSIS — Z992 Dependence on renal dialysis: Secondary | ICD-10-CM | POA: Diagnosis not present

## 2019-02-06 DIAGNOSIS — D631 Anemia in chronic kidney disease: Secondary | ICD-10-CM | POA: Diagnosis not present

## 2019-02-06 DIAGNOSIS — N2581 Secondary hyperparathyroidism of renal origin: Secondary | ICD-10-CM | POA: Diagnosis not present

## 2019-02-06 DIAGNOSIS — E1129 Type 2 diabetes mellitus with other diabetic kidney complication: Secondary | ICD-10-CM | POA: Diagnosis not present

## 2019-02-06 DIAGNOSIS — N186 End stage renal disease: Secondary | ICD-10-CM | POA: Diagnosis not present

## 2019-02-09 DIAGNOSIS — D631 Anemia in chronic kidney disease: Secondary | ICD-10-CM | POA: Diagnosis not present

## 2019-02-09 DIAGNOSIS — N186 End stage renal disease: Secondary | ICD-10-CM | POA: Diagnosis not present

## 2019-02-09 DIAGNOSIS — N2581 Secondary hyperparathyroidism of renal origin: Secondary | ICD-10-CM | POA: Diagnosis not present

## 2019-02-09 DIAGNOSIS — E1129 Type 2 diabetes mellitus with other diabetic kidney complication: Secondary | ICD-10-CM | POA: Diagnosis not present

## 2019-02-09 DIAGNOSIS — Z992 Dependence on renal dialysis: Secondary | ICD-10-CM | POA: Diagnosis not present

## 2019-02-11 DIAGNOSIS — N2581 Secondary hyperparathyroidism of renal origin: Secondary | ICD-10-CM | POA: Diagnosis not present

## 2019-02-11 DIAGNOSIS — N186 End stage renal disease: Secondary | ICD-10-CM | POA: Diagnosis not present

## 2019-02-11 DIAGNOSIS — E1129 Type 2 diabetes mellitus with other diabetic kidney complication: Secondary | ICD-10-CM | POA: Diagnosis not present

## 2019-02-11 DIAGNOSIS — Z992 Dependence on renal dialysis: Secondary | ICD-10-CM | POA: Diagnosis not present

## 2019-02-11 DIAGNOSIS — D631 Anemia in chronic kidney disease: Secondary | ICD-10-CM | POA: Diagnosis not present

## 2019-02-13 DIAGNOSIS — N2581 Secondary hyperparathyroidism of renal origin: Secondary | ICD-10-CM | POA: Diagnosis not present

## 2019-02-13 DIAGNOSIS — N186 End stage renal disease: Secondary | ICD-10-CM | POA: Diagnosis not present

## 2019-02-13 DIAGNOSIS — Z992 Dependence on renal dialysis: Secondary | ICD-10-CM | POA: Diagnosis not present

## 2019-02-13 DIAGNOSIS — E1129 Type 2 diabetes mellitus with other diabetic kidney complication: Secondary | ICD-10-CM | POA: Diagnosis not present

## 2019-02-13 DIAGNOSIS — D631 Anemia in chronic kidney disease: Secondary | ICD-10-CM | POA: Diagnosis not present

## 2019-02-16 DIAGNOSIS — E1129 Type 2 diabetes mellitus with other diabetic kidney complication: Secondary | ICD-10-CM | POA: Diagnosis not present

## 2019-02-16 DIAGNOSIS — D631 Anemia in chronic kidney disease: Secondary | ICD-10-CM | POA: Diagnosis not present

## 2019-02-16 DIAGNOSIS — N2581 Secondary hyperparathyroidism of renal origin: Secondary | ICD-10-CM | POA: Diagnosis not present

## 2019-02-16 DIAGNOSIS — N186 End stage renal disease: Secondary | ICD-10-CM | POA: Diagnosis not present

## 2019-02-16 DIAGNOSIS — Z992 Dependence on renal dialysis: Secondary | ICD-10-CM | POA: Diagnosis not present

## 2019-02-18 DIAGNOSIS — N186 End stage renal disease: Secondary | ICD-10-CM | POA: Diagnosis not present

## 2019-02-18 DIAGNOSIS — Z992 Dependence on renal dialysis: Secondary | ICD-10-CM | POA: Diagnosis not present

## 2019-02-18 DIAGNOSIS — E1129 Type 2 diabetes mellitus with other diabetic kidney complication: Secondary | ICD-10-CM | POA: Diagnosis not present

## 2019-02-18 DIAGNOSIS — N2581 Secondary hyperparathyroidism of renal origin: Secondary | ICD-10-CM | POA: Diagnosis not present

## 2019-02-18 DIAGNOSIS — D631 Anemia in chronic kidney disease: Secondary | ICD-10-CM | POA: Diagnosis not present

## 2019-02-20 DIAGNOSIS — E1129 Type 2 diabetes mellitus with other diabetic kidney complication: Secondary | ICD-10-CM | POA: Diagnosis not present

## 2019-02-20 DIAGNOSIS — N186 End stage renal disease: Secondary | ICD-10-CM | POA: Diagnosis not present

## 2019-02-20 DIAGNOSIS — D631 Anemia in chronic kidney disease: Secondary | ICD-10-CM | POA: Diagnosis not present

## 2019-02-20 DIAGNOSIS — N2581 Secondary hyperparathyroidism of renal origin: Secondary | ICD-10-CM | POA: Diagnosis not present

## 2019-02-20 DIAGNOSIS — Z992 Dependence on renal dialysis: Secondary | ICD-10-CM | POA: Diagnosis not present

## 2019-02-23 DIAGNOSIS — N186 End stage renal disease: Secondary | ICD-10-CM | POA: Diagnosis not present

## 2019-02-23 DIAGNOSIS — E1129 Type 2 diabetes mellitus with other diabetic kidney complication: Secondary | ICD-10-CM | POA: Diagnosis not present

## 2019-02-23 DIAGNOSIS — D631 Anemia in chronic kidney disease: Secondary | ICD-10-CM | POA: Diagnosis not present

## 2019-02-23 DIAGNOSIS — N2581 Secondary hyperparathyroidism of renal origin: Secondary | ICD-10-CM | POA: Diagnosis not present

## 2019-02-23 DIAGNOSIS — Z992 Dependence on renal dialysis: Secondary | ICD-10-CM | POA: Diagnosis not present

## 2019-02-25 DIAGNOSIS — T8620 Unspecified complication of heart transplant: Secondary | ICD-10-CM | POA: Diagnosis not present

## 2019-02-25 DIAGNOSIS — Z992 Dependence on renal dialysis: Secondary | ICD-10-CM | POA: Diagnosis not present

## 2019-02-25 DIAGNOSIS — D631 Anemia in chronic kidney disease: Secondary | ICD-10-CM | POA: Diagnosis not present

## 2019-02-25 DIAGNOSIS — E1129 Type 2 diabetes mellitus with other diabetic kidney complication: Secondary | ICD-10-CM | POA: Diagnosis not present

## 2019-02-25 DIAGNOSIS — N186 End stage renal disease: Secondary | ICD-10-CM | POA: Diagnosis not present

## 2019-02-25 DIAGNOSIS — N2581 Secondary hyperparathyroidism of renal origin: Secondary | ICD-10-CM | POA: Diagnosis not present

## 2019-02-27 DIAGNOSIS — Z992 Dependence on renal dialysis: Secondary | ICD-10-CM | POA: Diagnosis not present

## 2019-02-27 DIAGNOSIS — D631 Anemia in chronic kidney disease: Secondary | ICD-10-CM | POA: Diagnosis not present

## 2019-02-27 DIAGNOSIS — N186 End stage renal disease: Secondary | ICD-10-CM | POA: Diagnosis not present

## 2019-02-27 DIAGNOSIS — E1129 Type 2 diabetes mellitus with other diabetic kidney complication: Secondary | ICD-10-CM | POA: Diagnosis not present

## 2019-02-27 DIAGNOSIS — N2581 Secondary hyperparathyroidism of renal origin: Secondary | ICD-10-CM | POA: Diagnosis not present

## 2019-03-02 DIAGNOSIS — E1129 Type 2 diabetes mellitus with other diabetic kidney complication: Secondary | ICD-10-CM | POA: Diagnosis not present

## 2019-03-02 DIAGNOSIS — D631 Anemia in chronic kidney disease: Secondary | ICD-10-CM | POA: Diagnosis not present

## 2019-03-02 DIAGNOSIS — N2581 Secondary hyperparathyroidism of renal origin: Secondary | ICD-10-CM | POA: Diagnosis not present

## 2019-03-02 DIAGNOSIS — N186 End stage renal disease: Secondary | ICD-10-CM | POA: Diagnosis not present

## 2019-03-02 DIAGNOSIS — Z992 Dependence on renal dialysis: Secondary | ICD-10-CM | POA: Diagnosis not present

## 2019-03-04 DIAGNOSIS — E1129 Type 2 diabetes mellitus with other diabetic kidney complication: Secondary | ICD-10-CM | POA: Diagnosis not present

## 2019-03-04 DIAGNOSIS — N186 End stage renal disease: Secondary | ICD-10-CM | POA: Diagnosis not present

## 2019-03-04 DIAGNOSIS — D631 Anemia in chronic kidney disease: Secondary | ICD-10-CM | POA: Diagnosis not present

## 2019-03-04 DIAGNOSIS — N2581 Secondary hyperparathyroidism of renal origin: Secondary | ICD-10-CM | POA: Diagnosis not present

## 2019-03-04 DIAGNOSIS — Z992 Dependence on renal dialysis: Secondary | ICD-10-CM | POA: Diagnosis not present

## 2019-03-05 DIAGNOSIS — Z125 Encounter for screening for malignant neoplasm of prostate: Secondary | ICD-10-CM | POA: Diagnosis not present

## 2019-03-05 DIAGNOSIS — G629 Polyneuropathy, unspecified: Secondary | ICD-10-CM | POA: Diagnosis not present

## 2019-03-05 DIAGNOSIS — Z941 Heart transplant status: Secondary | ICD-10-CM | POA: Diagnosis not present

## 2019-03-05 DIAGNOSIS — N186 End stage renal disease: Secondary | ICD-10-CM | POA: Diagnosis not present

## 2019-03-05 DIAGNOSIS — Z79899 Other long term (current) drug therapy: Secondary | ICD-10-CM | POA: Diagnosis not present

## 2019-03-05 DIAGNOSIS — Z48298 Encounter for aftercare following other organ transplant: Secondary | ICD-10-CM | POA: Diagnosis not present

## 2019-03-05 DIAGNOSIS — C61 Malignant neoplasm of prostate: Secondary | ICD-10-CM | POA: Diagnosis not present

## 2019-03-05 DIAGNOSIS — Z4821 Encounter for aftercare following heart transplant: Secondary | ICD-10-CM | POA: Diagnosis not present

## 2019-03-05 DIAGNOSIS — Z992 Dependence on renal dialysis: Secondary | ICD-10-CM | POA: Diagnosis not present

## 2019-03-06 DIAGNOSIS — D631 Anemia in chronic kidney disease: Secondary | ICD-10-CM | POA: Diagnosis not present

## 2019-03-06 DIAGNOSIS — N186 End stage renal disease: Secondary | ICD-10-CM | POA: Diagnosis not present

## 2019-03-06 DIAGNOSIS — N2581 Secondary hyperparathyroidism of renal origin: Secondary | ICD-10-CM | POA: Diagnosis not present

## 2019-03-06 DIAGNOSIS — E1129 Type 2 diabetes mellitus with other diabetic kidney complication: Secondary | ICD-10-CM | POA: Diagnosis not present

## 2019-03-06 DIAGNOSIS — Z992 Dependence on renal dialysis: Secondary | ICD-10-CM | POA: Diagnosis not present

## 2019-03-09 DIAGNOSIS — N186 End stage renal disease: Secondary | ICD-10-CM | POA: Diagnosis not present

## 2019-03-09 DIAGNOSIS — Z992 Dependence on renal dialysis: Secondary | ICD-10-CM | POA: Diagnosis not present

## 2019-03-09 DIAGNOSIS — D631 Anemia in chronic kidney disease: Secondary | ICD-10-CM | POA: Diagnosis not present

## 2019-03-09 DIAGNOSIS — N2581 Secondary hyperparathyroidism of renal origin: Secondary | ICD-10-CM | POA: Diagnosis not present

## 2019-03-09 DIAGNOSIS — E1129 Type 2 diabetes mellitus with other diabetic kidney complication: Secondary | ICD-10-CM | POA: Diagnosis not present

## 2019-03-11 DIAGNOSIS — Z992 Dependence on renal dialysis: Secondary | ICD-10-CM | POA: Diagnosis not present

## 2019-03-11 DIAGNOSIS — N2581 Secondary hyperparathyroidism of renal origin: Secondary | ICD-10-CM | POA: Diagnosis not present

## 2019-03-11 DIAGNOSIS — D631 Anemia in chronic kidney disease: Secondary | ICD-10-CM | POA: Diagnosis not present

## 2019-03-11 DIAGNOSIS — E1129 Type 2 diabetes mellitus with other diabetic kidney complication: Secondary | ICD-10-CM | POA: Diagnosis not present

## 2019-03-11 DIAGNOSIS — N186 End stage renal disease: Secondary | ICD-10-CM | POA: Diagnosis not present

## 2019-03-13 DIAGNOSIS — D631 Anemia in chronic kidney disease: Secondary | ICD-10-CM | POA: Diagnosis not present

## 2019-03-13 DIAGNOSIS — N2581 Secondary hyperparathyroidism of renal origin: Secondary | ICD-10-CM | POA: Diagnosis not present

## 2019-03-13 DIAGNOSIS — E1129 Type 2 diabetes mellitus with other diabetic kidney complication: Secondary | ICD-10-CM | POA: Diagnosis not present

## 2019-03-13 DIAGNOSIS — N186 End stage renal disease: Secondary | ICD-10-CM | POA: Diagnosis not present

## 2019-03-13 DIAGNOSIS — Z992 Dependence on renal dialysis: Secondary | ICD-10-CM | POA: Diagnosis not present

## 2019-03-16 DIAGNOSIS — D631 Anemia in chronic kidney disease: Secondary | ICD-10-CM | POA: Diagnosis not present

## 2019-03-16 DIAGNOSIS — N186 End stage renal disease: Secondary | ICD-10-CM | POA: Diagnosis not present

## 2019-03-16 DIAGNOSIS — E1129 Type 2 diabetes mellitus with other diabetic kidney complication: Secondary | ICD-10-CM | POA: Diagnosis not present

## 2019-03-16 DIAGNOSIS — N2581 Secondary hyperparathyroidism of renal origin: Secondary | ICD-10-CM | POA: Diagnosis not present

## 2019-03-16 DIAGNOSIS — Z992 Dependence on renal dialysis: Secondary | ICD-10-CM | POA: Diagnosis not present

## 2019-03-18 DIAGNOSIS — N2581 Secondary hyperparathyroidism of renal origin: Secondary | ICD-10-CM | POA: Diagnosis not present

## 2019-03-18 DIAGNOSIS — Z992 Dependence on renal dialysis: Secondary | ICD-10-CM | POA: Diagnosis not present

## 2019-03-18 DIAGNOSIS — E1129 Type 2 diabetes mellitus with other diabetic kidney complication: Secondary | ICD-10-CM | POA: Diagnosis not present

## 2019-03-18 DIAGNOSIS — D631 Anemia in chronic kidney disease: Secondary | ICD-10-CM | POA: Diagnosis not present

## 2019-03-18 DIAGNOSIS — N186 End stage renal disease: Secondary | ICD-10-CM | POA: Diagnosis not present

## 2019-03-19 DIAGNOSIS — C61 Malignant neoplasm of prostate: Secondary | ICD-10-CM | POA: Diagnosis not present

## 2019-03-19 DIAGNOSIS — Z923 Personal history of irradiation: Secondary | ICD-10-CM | POA: Diagnosis not present

## 2019-03-19 DIAGNOSIS — Z114 Encounter for screening for human immunodeficiency virus [HIV]: Secondary | ICD-10-CM | POA: Diagnosis not present

## 2019-03-19 DIAGNOSIS — Z9884 Bariatric surgery status: Secondary | ICD-10-CM | POA: Diagnosis not present

## 2019-03-19 DIAGNOSIS — Z8673 Personal history of transient ischemic attack (TIA), and cerebral infarction without residual deficits: Secondary | ICD-10-CM | POA: Diagnosis not present

## 2019-03-19 DIAGNOSIS — N186 End stage renal disease: Secondary | ICD-10-CM | POA: Diagnosis not present

## 2019-03-19 DIAGNOSIS — Z79899 Other long term (current) drug therapy: Secondary | ICD-10-CM | POA: Diagnosis not present

## 2019-03-19 DIAGNOSIS — E1122 Type 2 diabetes mellitus with diabetic chronic kidney disease: Secondary | ICD-10-CM | POA: Diagnosis not present

## 2019-03-19 DIAGNOSIS — F54 Psychological and behavioral factors associated with disorders or diseases classified elsewhere: Secondary | ICD-10-CM | POA: Diagnosis not present

## 2019-03-19 DIAGNOSIS — Z7682 Awaiting organ transplant status: Secondary | ICD-10-CM | POA: Diagnosis not present

## 2019-03-19 DIAGNOSIS — Z01818 Encounter for other preprocedural examination: Secondary | ICD-10-CM | POA: Diagnosis not present

## 2019-03-19 DIAGNOSIS — N289 Disorder of kidney and ureter, unspecified: Secondary | ICD-10-CM | POA: Diagnosis not present

## 2019-03-19 DIAGNOSIS — Z941 Heart transplant status: Secondary | ICD-10-CM | POA: Diagnosis not present

## 2019-03-19 DIAGNOSIS — M311 Thrombotic microangiopathy: Secondary | ICD-10-CM | POA: Diagnosis not present

## 2019-03-19 DIAGNOSIS — G4733 Obstructive sleep apnea (adult) (pediatric): Secondary | ICD-10-CM | POA: Diagnosis not present

## 2019-03-19 DIAGNOSIS — Z992 Dependence on renal dialysis: Secondary | ICD-10-CM | POA: Diagnosis not present

## 2019-03-19 DIAGNOSIS — Z862 Personal history of diseases of the blood and blood-forming organs and certain disorders involving the immune mechanism: Secondary | ICD-10-CM | POA: Diagnosis not present

## 2019-03-19 DIAGNOSIS — Z125 Encounter for screening for malignant neoplasm of prostate: Secondary | ICD-10-CM | POA: Diagnosis not present

## 2019-03-20 DIAGNOSIS — E1129 Type 2 diabetes mellitus with other diabetic kidney complication: Secondary | ICD-10-CM | POA: Diagnosis not present

## 2019-03-20 DIAGNOSIS — Z992 Dependence on renal dialysis: Secondary | ICD-10-CM | POA: Diagnosis not present

## 2019-03-20 DIAGNOSIS — D631 Anemia in chronic kidney disease: Secondary | ICD-10-CM | POA: Diagnosis not present

## 2019-03-20 DIAGNOSIS — N186 End stage renal disease: Secondary | ICD-10-CM | POA: Diagnosis not present

## 2019-03-20 DIAGNOSIS — N2581 Secondary hyperparathyroidism of renal origin: Secondary | ICD-10-CM | POA: Diagnosis not present

## 2019-03-23 DIAGNOSIS — N186 End stage renal disease: Secondary | ICD-10-CM | POA: Diagnosis not present

## 2019-03-23 DIAGNOSIS — Z992 Dependence on renal dialysis: Secondary | ICD-10-CM | POA: Diagnosis not present

## 2019-03-23 DIAGNOSIS — N2581 Secondary hyperparathyroidism of renal origin: Secondary | ICD-10-CM | POA: Diagnosis not present

## 2019-03-23 DIAGNOSIS — D631 Anemia in chronic kidney disease: Secondary | ICD-10-CM | POA: Diagnosis not present

## 2019-03-23 DIAGNOSIS — E1129 Type 2 diabetes mellitus with other diabetic kidney complication: Secondary | ICD-10-CM | POA: Diagnosis not present

## 2019-03-25 DIAGNOSIS — Z992 Dependence on renal dialysis: Secondary | ICD-10-CM | POA: Diagnosis not present

## 2019-03-25 DIAGNOSIS — N2581 Secondary hyperparathyroidism of renal origin: Secondary | ICD-10-CM | POA: Diagnosis not present

## 2019-03-25 DIAGNOSIS — N186 End stage renal disease: Secondary | ICD-10-CM | POA: Diagnosis not present

## 2019-03-25 DIAGNOSIS — E1129 Type 2 diabetes mellitus with other diabetic kidney complication: Secondary | ICD-10-CM | POA: Diagnosis not present

## 2019-03-25 DIAGNOSIS — D631 Anemia in chronic kidney disease: Secondary | ICD-10-CM | POA: Diagnosis not present

## 2019-03-27 DIAGNOSIS — N2581 Secondary hyperparathyroidism of renal origin: Secondary | ICD-10-CM | POA: Diagnosis not present

## 2019-03-27 DIAGNOSIS — Z992 Dependence on renal dialysis: Secondary | ICD-10-CM | POA: Diagnosis not present

## 2019-03-27 DIAGNOSIS — N186 End stage renal disease: Secondary | ICD-10-CM | POA: Diagnosis not present

## 2019-03-27 DIAGNOSIS — D631 Anemia in chronic kidney disease: Secondary | ICD-10-CM | POA: Diagnosis not present

## 2019-03-27 DIAGNOSIS — E1129 Type 2 diabetes mellitus with other diabetic kidney complication: Secondary | ICD-10-CM | POA: Diagnosis not present

## 2019-03-28 DIAGNOSIS — N186 End stage renal disease: Secondary | ICD-10-CM | POA: Diagnosis not present

## 2019-03-28 DIAGNOSIS — T8620 Unspecified complication of heart transplant: Secondary | ICD-10-CM | POA: Diagnosis not present

## 2019-03-28 DIAGNOSIS — Z992 Dependence on renal dialysis: Secondary | ICD-10-CM | POA: Diagnosis not present

## 2019-03-30 DIAGNOSIS — Z992 Dependence on renal dialysis: Secondary | ICD-10-CM | POA: Diagnosis not present

## 2019-03-30 DIAGNOSIS — D631 Anemia in chronic kidney disease: Secondary | ICD-10-CM | POA: Diagnosis not present

## 2019-03-30 DIAGNOSIS — N186 End stage renal disease: Secondary | ICD-10-CM | POA: Diagnosis not present

## 2019-03-30 DIAGNOSIS — N2581 Secondary hyperparathyroidism of renal origin: Secondary | ICD-10-CM | POA: Diagnosis not present

## 2019-03-31 DIAGNOSIS — Z0000 Encounter for general adult medical examination without abnormal findings: Secondary | ICD-10-CM | POA: Diagnosis not present

## 2019-03-31 DIAGNOSIS — Z941 Heart transplant status: Secondary | ICD-10-CM | POA: Diagnosis not present

## 2019-03-31 DIAGNOSIS — C61 Malignant neoplasm of prostate: Secondary | ICD-10-CM | POA: Diagnosis not present

## 2019-03-31 DIAGNOSIS — G629 Polyneuropathy, unspecified: Secondary | ICD-10-CM | POA: Diagnosis not present

## 2019-03-31 DIAGNOSIS — N185 Chronic kidney disease, stage 5: Secondary | ICD-10-CM | POA: Diagnosis not present

## 2019-03-31 DIAGNOSIS — Z1389 Encounter for screening for other disorder: Secondary | ICD-10-CM | POA: Diagnosis not present

## 2019-04-01 DIAGNOSIS — N186 End stage renal disease: Secondary | ICD-10-CM | POA: Diagnosis not present

## 2019-04-01 DIAGNOSIS — D631 Anemia in chronic kidney disease: Secondary | ICD-10-CM | POA: Diagnosis not present

## 2019-04-01 DIAGNOSIS — Z992 Dependence on renal dialysis: Secondary | ICD-10-CM | POA: Diagnosis not present

## 2019-04-01 DIAGNOSIS — N2581 Secondary hyperparathyroidism of renal origin: Secondary | ICD-10-CM | POA: Diagnosis not present

## 2019-04-02 ENCOUNTER — Encounter: Payer: Self-pay | Admitting: Physical Medicine & Rehabilitation

## 2019-04-03 DIAGNOSIS — N186 End stage renal disease: Secondary | ICD-10-CM | POA: Diagnosis not present

## 2019-04-03 DIAGNOSIS — N2581 Secondary hyperparathyroidism of renal origin: Secondary | ICD-10-CM | POA: Diagnosis not present

## 2019-04-03 DIAGNOSIS — D631 Anemia in chronic kidney disease: Secondary | ICD-10-CM | POA: Diagnosis not present

## 2019-04-03 DIAGNOSIS — Z992 Dependence on renal dialysis: Secondary | ICD-10-CM | POA: Diagnosis not present

## 2019-04-06 DIAGNOSIS — N2581 Secondary hyperparathyroidism of renal origin: Secondary | ICD-10-CM | POA: Diagnosis not present

## 2019-04-06 DIAGNOSIS — N186 End stage renal disease: Secondary | ICD-10-CM | POA: Diagnosis not present

## 2019-04-06 DIAGNOSIS — Z992 Dependence on renal dialysis: Secondary | ICD-10-CM | POA: Diagnosis not present

## 2019-04-06 DIAGNOSIS — D631 Anemia in chronic kidney disease: Secondary | ICD-10-CM | POA: Diagnosis not present

## 2019-04-08 DIAGNOSIS — Z992 Dependence on renal dialysis: Secondary | ICD-10-CM | POA: Diagnosis not present

## 2019-04-08 DIAGNOSIS — N186 End stage renal disease: Secondary | ICD-10-CM | POA: Diagnosis not present

## 2019-04-08 DIAGNOSIS — D631 Anemia in chronic kidney disease: Secondary | ICD-10-CM | POA: Diagnosis not present

## 2019-04-08 DIAGNOSIS — N2581 Secondary hyperparathyroidism of renal origin: Secondary | ICD-10-CM | POA: Diagnosis not present

## 2019-04-10 DIAGNOSIS — Z992 Dependence on renal dialysis: Secondary | ICD-10-CM | POA: Diagnosis not present

## 2019-04-10 DIAGNOSIS — N186 End stage renal disease: Secondary | ICD-10-CM | POA: Diagnosis not present

## 2019-04-10 DIAGNOSIS — N2581 Secondary hyperparathyroidism of renal origin: Secondary | ICD-10-CM | POA: Diagnosis not present

## 2019-04-13 DIAGNOSIS — Z992 Dependence on renal dialysis: Secondary | ICD-10-CM | POA: Diagnosis not present

## 2019-04-13 DIAGNOSIS — N2581 Secondary hyperparathyroidism of renal origin: Secondary | ICD-10-CM | POA: Diagnosis not present

## 2019-04-13 DIAGNOSIS — N186 End stage renal disease: Secondary | ICD-10-CM | POA: Diagnosis not present

## 2019-04-13 DIAGNOSIS — D631 Anemia in chronic kidney disease: Secondary | ICD-10-CM | POA: Diagnosis not present

## 2019-04-14 ENCOUNTER — Encounter: Payer: Self-pay | Admitting: Physical Medicine & Rehabilitation

## 2019-04-14 ENCOUNTER — Other Ambulatory Visit: Payer: Self-pay

## 2019-04-14 ENCOUNTER — Encounter: Payer: Medicare Other | Attending: Physical Medicine & Rehabilitation | Admitting: Physical Medicine & Rehabilitation

## 2019-04-14 VITALS — BP 93/61 | HR 81 | Temp 98.7°F | Ht 78.0 in | Wt 318.0 lb

## 2019-04-14 DIAGNOSIS — E0842 Diabetes mellitus due to underlying condition with diabetic polyneuropathy: Secondary | ICD-10-CM | POA: Diagnosis not present

## 2019-04-14 DIAGNOSIS — I639 Cerebral infarction, unspecified: Secondary | ICD-10-CM

## 2019-04-14 DIAGNOSIS — N186 End stage renal disease: Secondary | ICD-10-CM | POA: Insufficient documentation

## 2019-04-14 DIAGNOSIS — G479 Sleep disorder, unspecified: Secondary | ICD-10-CM | POA: Insufficient documentation

## 2019-04-14 DIAGNOSIS — Z992 Dependence on renal dialysis: Secondary | ICD-10-CM | POA: Insufficient documentation

## 2019-04-14 DIAGNOSIS — R269 Unspecified abnormalities of gait and mobility: Secondary | ICD-10-CM | POA: Insufficient documentation

## 2019-04-14 DIAGNOSIS — G6289 Other specified polyneuropathies: Secondary | ICD-10-CM

## 2019-04-14 MED ORDER — LIDOCAINE 5 % EX OINT: 1.0000 "application " | TOPICAL_OINTMENT | CUTANEOUS | 0 refills | Status: DC | PRN

## 2019-04-14 MED ORDER — PREGABALIN 75 MG PO CAPS: 75.0000 mg | ORAL_CAPSULE | Freq: Every day | ORAL | 1 refills | Status: DC

## 2019-04-14 MED ORDER — PREGABALIN 100 MG PO CAPS: ORAL_CAPSULE | ORAL | 1 refills | Status: DC

## 2019-04-14 NOTE — Progress Notes (Signed)
Subjective:    Patient ID: [name], male    DOB: [birth year], 58 y.o.   MRN: [id number]  HPI Male with pmh of heart transplant, ESRD, prostate CA, OSA, obesity, CHF, GERD, DM, CVA with residual left hand weakness, presents with peripheral neuropathy. B/l knees distally and b/l hands. Started after transplant and dialysis in ~2015.  Getting progressively worse.  Worse prior to bed.  HD exacerbates the pain.  Off HD days better. Pins/needles/tingling.  Associated numbness. Lyrica helps slightly.  Denies falls. Pain limits ambulation.   Pain Inventory Average Pain 10 Pain Right Now 10 My pain is sharp, burning, dull, stabbing, tingling and aching  In the last 24 hours, has pain interfered with the following? General activity 10 Relation with others 8 Enjoyment of life 10 What TIME of day is your pain at its worst? night Sleep (in general) Poor  Pain is worse with: walking, sitting, inactivity, standing and some activites Pain improves with: rest Relief from Meds: 2  Mobility use a cane ability to climb steps?  yes do you drive?  yes  Function retired I need assistance with the following:  household duties and shopping  Neuro/Psych numbness tingling trouble walking  Prior Studies new pt  Physicians involved in your care new pt   Family History  Problem Relation Age of Onset  . Coronary artery disease Father        had, PTCA & CABG  . Heart attack Father   . Hypertension Father   . Diabetes Father   . Prostate cancer Father   . Coronary artery disease Mother   . Heart attack Mother   . Hypertension Mother   . Stroke Mother   . Lupus Mother   . Prostate cancer Brother   . Coronary artery disease Brother   . Coronary artery disease Sister   . Heart attack Sister   . Prostate cancer Paternal Uncle   . Coronary artery disease Maternal Grandmother   . Coronary artery disease Maternal Grandfather   . Coronary artery disease Paternal Grandmother   .  Coronary artery disease Paternal Grandfather    Social History   Socioeconomic History  . Marital status: Married    Spouse name: Nicki Reaper  . Number of children: 1  . Years of education: 75  . Highest education level: Not on file  Occupational History  . Occupation: retired    Fish farm manager: [employer]  . Financial resource strain: Not on file  . Food insecurity    Worry: Not on file    Inability: Not on file  . Transportation needs    Medical: Not on file    Non-medical: Not on file  Tobacco Use  . Smoking status: Never Smoker  . Smokeless tobacco: Former Systems developer    Types: Snuff  Substance and Sexual Activity  . Alcohol use: No  . Drug use: No  . Sexual activity: Not Currently  Lifestyle  . Physical activity    Days per week: Not on file    Minutes per session: Not on file  . Stress: Not on file  Relationships  . Social Herbalist on phone: Not on file    Gets together: Not on file    Attends religious service: Not on file    Active member of club or organization: Not on file    Attends meetings of clubs or organizations: Not on file    Relationship status: Not on file  Other Topics Concern  . Not on file  Social History Narrative   Lives with wife in a one story home.  Has one child.     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    Education: college.    Past Surgical History:  Procedure Laterality Date  . CARDIAC PACEMAKER PLACEMENT  09/21/2009   Biventricular implantable cardioverter-defibrillator implantation     . COLONOSCOPY W/ BIOPSIES AND POLYPECTOMY    . FOOT SURGERY     left  . HEART TRANSPLANT  2014  . LAPAROSCOPIC GASTRIC BANDING  01/27/2007  . LEFT VENTRICULAR ASSIST DEVICE     implant[facility] at [facility]  . PROSTATE BIOPSY    . TOTAL HIP ARTHROPLASTY Right 07/22/2017   Procedure: RIGHT TOTAL HIP ARTHROPLASTY ANTERIOR APPROACH;  Surgeon: Rod Can, MD;  Location: [place];  Service: Orthopedics;   Laterality: Right;  Needs RNFA  . TOTAL KNEE ARTHROPLASTY Right 07/22/2017   Past Medical History:  Diagnosis Date  . Arthritis   . Atrial fibrillation ([place])   . CHF (congestive heart failure), NYHA class III (HCC)    s/p heart transplant  . Chronic kidney disease    end stage  . Chronic systolic dysfunction of left ventricle   . CVA (cerebral infarction)   . Diabetes mellitus    PMH; Prior to heart transplant  . Family history of coronary artery disease    in both parents  . GERD (gastroesophageal reflux disease)   . Hyperlipidemia   . Hypertension   . Left bundle branch block   . Morbid obesity ([place])    status post lap band  . Myocardial infarction [facility])    prior to heart transplant  . Nonischemic cardiomyopathy ([place])    prior to heart transplant  . Obesity (BMI 30-39.9)   . Obstructive sleep apnea    no longer needs CPAP after heart transplant per pt  . Premature ventricular contractions   . Prostate cancer ([place])   . SOB (shortness of breath)    BP 93/61   Pulse 81   Temp 98.7 F (37.1 C)   Ht 6\' 6"  (1.981 m)   Wt (!) 318 lb (144.2 kg)   SpO2 95%   BMI 36.75 kg/m   Opioid Risk Score:   Fall Risk Score:  `1  Depression screen PHQ 2/9  No flowsheet data found.   Review of Systems  Constitutional: Negative.   HENT: Negative.   Eyes: Negative.   Respiratory: Negative.   Cardiovascular: Negative.   Gastrointestinal: Negative.   Endocrine: Negative.   Genitourinary: Negative.   Musculoskeletal: Positive for gait problem and myalgias.  Skin: Negative.   Allergic/Immunologic: Negative.   Neurological: Positive for numbness.  Hematological: Negative.   Psychiatric/Behavioral: Negative.        Objective:   Physical Exam Constitutional: No distress . Vital signs reviewed. HENT: Normocephalic.  Atraumatic. Eyes: EOMI. No discharge. Cardiovascular: No JVD. Respiratory: Normal effort.  No stridor. GI: Non-distended. Skin: Warm and dry.  Intact.  Psych: Normal mood.  Normal behavior. Musc: No edema.  No tenderness. Neuro: Alert Motor: B/l LE 4/5 Sensation diminished to light touch distal to b/l knees    Assessment & Plan:  Male with pmh of heart transplant, ESRD, prostate CA, OSA, obesity, CHF, GERD, DM, CVA with residual left hand weakness, presents with peripheral neuropathy.   1. Polyneuropathy - multifactorial related to DM, HD  Labs reviewed  Referral information reviewed - Neuro notes from 2018, trialed  different medications  PMAWARE reviewed  Cont Heat  Will consider trial TENS  Will order Lidocaine ointment  Will change Lyrica to 75 daily with additional 100mg  post HD  Cont Cymbalta  Will consider Robaxin   Will consider Referral to Psychology  Patient states main goal is to ambulate more  Encouraged Epson salt baths  Will consider Capsicin, baths  2. Gait abnormality  Cont cane for safety  3. Sleep disturbance  See #1  Will consider Elavil  4. Morbid Obesity  Will consider referral to dietitian

## 2019-04-15 DIAGNOSIS — Z992 Dependence on renal dialysis: Secondary | ICD-10-CM | POA: Diagnosis not present

## 2019-04-15 DIAGNOSIS — N2581 Secondary hyperparathyroidism of renal origin: Secondary | ICD-10-CM | POA: Diagnosis not present

## 2019-04-15 DIAGNOSIS — D631 Anemia in chronic kidney disease: Secondary | ICD-10-CM | POA: Diagnosis not present

## 2019-04-15 DIAGNOSIS — N186 End stage renal disease: Secondary | ICD-10-CM | POA: Diagnosis not present

## 2019-04-17 DIAGNOSIS — D631 Anemia in chronic kidney disease: Secondary | ICD-10-CM | POA: Diagnosis not present

## 2019-04-17 DIAGNOSIS — N2581 Secondary hyperparathyroidism of renal origin: Secondary | ICD-10-CM | POA: Diagnosis not present

## 2019-04-17 DIAGNOSIS — N186 End stage renal disease: Secondary | ICD-10-CM | POA: Diagnosis not present

## 2019-04-17 DIAGNOSIS — Z992 Dependence on renal dialysis: Secondary | ICD-10-CM | POA: Diagnosis not present

## 2019-04-20 DIAGNOSIS — D631 Anemia in chronic kidney disease: Secondary | ICD-10-CM | POA: Diagnosis not present

## 2019-04-20 DIAGNOSIS — N2581 Secondary hyperparathyroidism of renal origin: Secondary | ICD-10-CM | POA: Diagnosis not present

## 2019-04-20 DIAGNOSIS — Z992 Dependence on renal dialysis: Secondary | ICD-10-CM | POA: Diagnosis not present

## 2019-04-20 DIAGNOSIS — N186 End stage renal disease: Secondary | ICD-10-CM | POA: Diagnosis not present

## 2019-04-22 DIAGNOSIS — N2581 Secondary hyperparathyroidism of renal origin: Secondary | ICD-10-CM | POA: Diagnosis not present

## 2019-04-22 DIAGNOSIS — Z992 Dependence on renal dialysis: Secondary | ICD-10-CM | POA: Diagnosis not present

## 2019-04-22 DIAGNOSIS — D631 Anemia in chronic kidney disease: Secondary | ICD-10-CM | POA: Diagnosis not present

## 2019-04-22 DIAGNOSIS — N186 End stage renal disease: Secondary | ICD-10-CM | POA: Diagnosis not present

## 2019-04-24 DIAGNOSIS — D631 Anemia in chronic kidney disease: Secondary | ICD-10-CM | POA: Diagnosis not present

## 2019-04-24 DIAGNOSIS — N2581 Secondary hyperparathyroidism of renal origin: Secondary | ICD-10-CM | POA: Diagnosis not present

## 2019-04-24 DIAGNOSIS — N186 End stage renal disease: Secondary | ICD-10-CM | POA: Diagnosis not present

## 2019-04-24 DIAGNOSIS — Z992 Dependence on renal dialysis: Secondary | ICD-10-CM | POA: Diagnosis not present

## 2019-04-27 DIAGNOSIS — N2581 Secondary hyperparathyroidism of renal origin: Secondary | ICD-10-CM | POA: Diagnosis not present

## 2019-04-27 DIAGNOSIS — D631 Anemia in chronic kidney disease: Secondary | ICD-10-CM | POA: Diagnosis not present

## 2019-04-27 DIAGNOSIS — N186 End stage renal disease: Secondary | ICD-10-CM | POA: Diagnosis not present

## 2019-04-27 DIAGNOSIS — Z992 Dependence on renal dialysis: Secondary | ICD-10-CM | POA: Diagnosis not present

## 2019-04-28 DIAGNOSIS — Z992 Dependence on renal dialysis: Secondary | ICD-10-CM | POA: Diagnosis not present

## 2019-04-28 DIAGNOSIS — N186 End stage renal disease: Secondary | ICD-10-CM | POA: Diagnosis not present

## 2019-04-28 DIAGNOSIS — T8620 Unspecified complication of heart transplant: Secondary | ICD-10-CM | POA: Diagnosis not present

## 2019-04-29 DIAGNOSIS — N2581 Secondary hyperparathyroidism of renal origin: Secondary | ICD-10-CM | POA: Diagnosis not present

## 2019-04-29 DIAGNOSIS — Z992 Dependence on renal dialysis: Secondary | ICD-10-CM | POA: Diagnosis not present

## 2019-04-29 DIAGNOSIS — D631 Anemia in chronic kidney disease: Secondary | ICD-10-CM | POA: Diagnosis not present

## 2019-04-29 DIAGNOSIS — Z23 Encounter for immunization: Secondary | ICD-10-CM | POA: Diagnosis not present

## 2019-04-29 DIAGNOSIS — D509 Iron deficiency anemia, unspecified: Secondary | ICD-10-CM | POA: Diagnosis not present

## 2019-04-29 DIAGNOSIS — N186 End stage renal disease: Secondary | ICD-10-CM | POA: Diagnosis not present

## 2019-04-29 DIAGNOSIS — E1129 Type 2 diabetes mellitus with other diabetic kidney complication: Secondary | ICD-10-CM | POA: Diagnosis not present

## 2019-05-01 DIAGNOSIS — D631 Anemia in chronic kidney disease: Secondary | ICD-10-CM | POA: Diagnosis not present

## 2019-05-01 DIAGNOSIS — N2581 Secondary hyperparathyroidism of renal origin: Secondary | ICD-10-CM | POA: Diagnosis not present

## 2019-05-01 DIAGNOSIS — Z992 Dependence on renal dialysis: Secondary | ICD-10-CM | POA: Diagnosis not present

## 2019-05-01 DIAGNOSIS — N186 End stage renal disease: Secondary | ICD-10-CM | POA: Diagnosis not present

## 2019-05-01 DIAGNOSIS — D509 Iron deficiency anemia, unspecified: Secondary | ICD-10-CM | POA: Diagnosis not present

## 2019-05-01 DIAGNOSIS — E1129 Type 2 diabetes mellitus with other diabetic kidney complication: Secondary | ICD-10-CM | POA: Diagnosis not present

## 2019-05-04 DIAGNOSIS — Z992 Dependence on renal dialysis: Secondary | ICD-10-CM | POA: Diagnosis not present

## 2019-05-04 DIAGNOSIS — D631 Anemia in chronic kidney disease: Secondary | ICD-10-CM | POA: Diagnosis not present

## 2019-05-04 DIAGNOSIS — D509 Iron deficiency anemia, unspecified: Secondary | ICD-10-CM | POA: Diagnosis not present

## 2019-05-04 DIAGNOSIS — N2581 Secondary hyperparathyroidism of renal origin: Secondary | ICD-10-CM | POA: Diagnosis not present

## 2019-05-04 DIAGNOSIS — E1129 Type 2 diabetes mellitus with other diabetic kidney complication: Secondary | ICD-10-CM | POA: Diagnosis not present

## 2019-05-04 DIAGNOSIS — N186 End stage renal disease: Secondary | ICD-10-CM | POA: Diagnosis not present

## 2019-05-06 DIAGNOSIS — D631 Anemia in chronic kidney disease: Secondary | ICD-10-CM | POA: Diagnosis not present

## 2019-05-06 DIAGNOSIS — E1129 Type 2 diabetes mellitus with other diabetic kidney complication: Secondary | ICD-10-CM | POA: Diagnosis not present

## 2019-05-06 DIAGNOSIS — N186 End stage renal disease: Secondary | ICD-10-CM | POA: Diagnosis not present

## 2019-05-06 DIAGNOSIS — Z992 Dependence on renal dialysis: Secondary | ICD-10-CM | POA: Diagnosis not present

## 2019-05-06 DIAGNOSIS — D509 Iron deficiency anemia, unspecified: Secondary | ICD-10-CM | POA: Diagnosis not present

## 2019-05-06 DIAGNOSIS — N2581 Secondary hyperparathyroidism of renal origin: Secondary | ICD-10-CM | POA: Diagnosis not present

## 2019-05-08 DIAGNOSIS — D631 Anemia in chronic kidney disease: Secondary | ICD-10-CM | POA: Diagnosis not present

## 2019-05-08 DIAGNOSIS — D509 Iron deficiency anemia, unspecified: Secondary | ICD-10-CM | POA: Diagnosis not present

## 2019-05-08 DIAGNOSIS — E1129 Type 2 diabetes mellitus with other diabetic kidney complication: Secondary | ICD-10-CM | POA: Diagnosis not present

## 2019-05-08 DIAGNOSIS — N186 End stage renal disease: Secondary | ICD-10-CM | POA: Diagnosis not present

## 2019-05-08 DIAGNOSIS — N2581 Secondary hyperparathyroidism of renal origin: Secondary | ICD-10-CM | POA: Diagnosis not present

## 2019-05-08 DIAGNOSIS — Z992 Dependence on renal dialysis: Secondary | ICD-10-CM | POA: Diagnosis not present

## 2019-05-11 DIAGNOSIS — N2581 Secondary hyperparathyroidism of renal origin: Secondary | ICD-10-CM | POA: Diagnosis not present

## 2019-05-11 DIAGNOSIS — N186 End stage renal disease: Secondary | ICD-10-CM | POA: Diagnosis not present

## 2019-05-11 DIAGNOSIS — Z992 Dependence on renal dialysis: Secondary | ICD-10-CM | POA: Diagnosis not present

## 2019-05-11 DIAGNOSIS — D631 Anemia in chronic kidney disease: Secondary | ICD-10-CM | POA: Diagnosis not present

## 2019-05-11 DIAGNOSIS — E1129 Type 2 diabetes mellitus with other diabetic kidney complication: Secondary | ICD-10-CM | POA: Diagnosis not present

## 2019-05-11 DIAGNOSIS — D509 Iron deficiency anemia, unspecified: Secondary | ICD-10-CM | POA: Diagnosis not present

## 2019-05-13 DIAGNOSIS — N186 End stage renal disease: Secondary | ICD-10-CM | POA: Diagnosis not present

## 2019-05-13 DIAGNOSIS — Z992 Dependence on renal dialysis: Secondary | ICD-10-CM | POA: Diagnosis not present

## 2019-05-13 DIAGNOSIS — D509 Iron deficiency anemia, unspecified: Secondary | ICD-10-CM | POA: Diagnosis not present

## 2019-05-13 DIAGNOSIS — E1129 Type 2 diabetes mellitus with other diabetic kidney complication: Secondary | ICD-10-CM | POA: Diagnosis not present

## 2019-05-13 DIAGNOSIS — D631 Anemia in chronic kidney disease: Secondary | ICD-10-CM | POA: Diagnosis not present

## 2019-05-13 DIAGNOSIS — N2581 Secondary hyperparathyroidism of renal origin: Secondary | ICD-10-CM | POA: Diagnosis not present

## 2019-05-15 DIAGNOSIS — N2581 Secondary hyperparathyroidism of renal origin: Secondary | ICD-10-CM | POA: Diagnosis not present

## 2019-05-15 DIAGNOSIS — E1129 Type 2 diabetes mellitus with other diabetic kidney complication: Secondary | ICD-10-CM | POA: Diagnosis not present

## 2019-05-15 DIAGNOSIS — D509 Iron deficiency anemia, unspecified: Secondary | ICD-10-CM | POA: Diagnosis not present

## 2019-05-15 DIAGNOSIS — D631 Anemia in chronic kidney disease: Secondary | ICD-10-CM | POA: Diagnosis not present

## 2019-05-15 DIAGNOSIS — Z992 Dependence on renal dialysis: Secondary | ICD-10-CM | POA: Diagnosis not present

## 2019-05-15 DIAGNOSIS — N186 End stage renal disease: Secondary | ICD-10-CM | POA: Diagnosis not present

## 2019-05-19 DIAGNOSIS — Z992 Dependence on renal dialysis: Secondary | ICD-10-CM | POA: Diagnosis not present

## 2019-05-19 DIAGNOSIS — N186 End stage renal disease: Secondary | ICD-10-CM | POA: Diagnosis not present

## 2019-05-21 DIAGNOSIS — N186 End stage renal disease: Secondary | ICD-10-CM | POA: Diagnosis not present

## 2019-05-21 DIAGNOSIS — Z992 Dependence on renal dialysis: Secondary | ICD-10-CM | POA: Diagnosis not present

## 2019-05-25 DIAGNOSIS — Z992 Dependence on renal dialysis: Secondary | ICD-10-CM | POA: Diagnosis not present

## 2019-05-25 DIAGNOSIS — D509 Iron deficiency anemia, unspecified: Secondary | ICD-10-CM | POA: Diagnosis not present

## 2019-05-25 DIAGNOSIS — N186 End stage renal disease: Secondary | ICD-10-CM | POA: Diagnosis not present

## 2019-05-25 DIAGNOSIS — E1129 Type 2 diabetes mellitus with other diabetic kidney complication: Secondary | ICD-10-CM | POA: Diagnosis not present

## 2019-05-25 DIAGNOSIS — D631 Anemia in chronic kidney disease: Secondary | ICD-10-CM | POA: Diagnosis not present

## 2019-05-25 DIAGNOSIS — N2581 Secondary hyperparathyroidism of renal origin: Secondary | ICD-10-CM | POA: Diagnosis not present

## 2019-05-27 DIAGNOSIS — D509 Iron deficiency anemia, unspecified: Secondary | ICD-10-CM | POA: Diagnosis not present

## 2019-05-27 DIAGNOSIS — N2581 Secondary hyperparathyroidism of renal origin: Secondary | ICD-10-CM | POA: Diagnosis not present

## 2019-05-27 DIAGNOSIS — D631 Anemia in chronic kidney disease: Secondary | ICD-10-CM | POA: Diagnosis not present

## 2019-05-27 DIAGNOSIS — E1129 Type 2 diabetes mellitus with other diabetic kidney complication: Secondary | ICD-10-CM | POA: Diagnosis not present

## 2019-05-27 DIAGNOSIS — Z992 Dependence on renal dialysis: Secondary | ICD-10-CM | POA: Diagnosis not present

## 2019-05-27 DIAGNOSIS — N186 End stage renal disease: Secondary | ICD-10-CM | POA: Diagnosis not present

## 2019-05-28 DIAGNOSIS — T8620 Unspecified complication of heart transplant: Secondary | ICD-10-CM | POA: Diagnosis not present

## 2019-05-28 DIAGNOSIS — N186 End stage renal disease: Secondary | ICD-10-CM | POA: Diagnosis not present

## 2019-05-28 DIAGNOSIS — Z992 Dependence on renal dialysis: Secondary | ICD-10-CM | POA: Diagnosis not present

## 2019-05-29 DIAGNOSIS — Z992 Dependence on renal dialysis: Secondary | ICD-10-CM | POA: Diagnosis not present

## 2019-05-29 DIAGNOSIS — D509 Iron deficiency anemia, unspecified: Secondary | ICD-10-CM | POA: Diagnosis not present

## 2019-05-29 DIAGNOSIS — N186 End stage renal disease: Secondary | ICD-10-CM | POA: Diagnosis not present

## 2019-05-29 DIAGNOSIS — D631 Anemia in chronic kidney disease: Secondary | ICD-10-CM | POA: Diagnosis not present

## 2019-05-29 DIAGNOSIS — N2581 Secondary hyperparathyroidism of renal origin: Secondary | ICD-10-CM | POA: Diagnosis not present

## 2019-05-29 DIAGNOSIS — E1129 Type 2 diabetes mellitus with other diabetic kidney complication: Secondary | ICD-10-CM | POA: Diagnosis not present

## 2019-06-01 DIAGNOSIS — D509 Iron deficiency anemia, unspecified: Secondary | ICD-10-CM | POA: Diagnosis not present

## 2019-06-01 DIAGNOSIS — E1129 Type 2 diabetes mellitus with other diabetic kidney complication: Secondary | ICD-10-CM | POA: Diagnosis not present

## 2019-06-01 DIAGNOSIS — D631 Anemia in chronic kidney disease: Secondary | ICD-10-CM | POA: Diagnosis not present

## 2019-06-01 DIAGNOSIS — N2581 Secondary hyperparathyroidism of renal origin: Secondary | ICD-10-CM | POA: Diagnosis not present

## 2019-06-01 DIAGNOSIS — Z992 Dependence on renal dialysis: Secondary | ICD-10-CM | POA: Diagnosis not present

## 2019-06-01 DIAGNOSIS — N186 End stage renal disease: Secondary | ICD-10-CM | POA: Diagnosis not present

## 2019-06-03 DIAGNOSIS — D631 Anemia in chronic kidney disease: Secondary | ICD-10-CM | POA: Diagnosis not present

## 2019-06-03 DIAGNOSIS — N186 End stage renal disease: Secondary | ICD-10-CM | POA: Diagnosis not present

## 2019-06-03 DIAGNOSIS — E1129 Type 2 diabetes mellitus with other diabetic kidney complication: Secondary | ICD-10-CM | POA: Diagnosis not present

## 2019-06-03 DIAGNOSIS — Z992 Dependence on renal dialysis: Secondary | ICD-10-CM | POA: Diagnosis not present

## 2019-06-03 DIAGNOSIS — D509 Iron deficiency anemia, unspecified: Secondary | ICD-10-CM | POA: Diagnosis not present

## 2019-06-03 DIAGNOSIS — N2581 Secondary hyperparathyroidism of renal origin: Secondary | ICD-10-CM | POA: Diagnosis not present

## 2019-06-05 DIAGNOSIS — D631 Anemia in chronic kidney disease: Secondary | ICD-10-CM | POA: Diagnosis not present

## 2019-06-05 DIAGNOSIS — D509 Iron deficiency anemia, unspecified: Secondary | ICD-10-CM | POA: Diagnosis not present

## 2019-06-05 DIAGNOSIS — N2581 Secondary hyperparathyroidism of renal origin: Secondary | ICD-10-CM | POA: Diagnosis not present

## 2019-06-05 DIAGNOSIS — Z992 Dependence on renal dialysis: Secondary | ICD-10-CM | POA: Diagnosis not present

## 2019-06-05 DIAGNOSIS — E1129 Type 2 diabetes mellitus with other diabetic kidney complication: Secondary | ICD-10-CM | POA: Diagnosis not present

## 2019-06-05 DIAGNOSIS — N186 End stage renal disease: Secondary | ICD-10-CM | POA: Diagnosis not present

## 2019-06-08 DIAGNOSIS — Z992 Dependence on renal dialysis: Secondary | ICD-10-CM | POA: Diagnosis not present

## 2019-06-08 DIAGNOSIS — E1129 Type 2 diabetes mellitus with other diabetic kidney complication: Secondary | ICD-10-CM | POA: Diagnosis not present

## 2019-06-08 DIAGNOSIS — N186 End stage renal disease: Secondary | ICD-10-CM | POA: Diagnosis not present

## 2019-06-08 DIAGNOSIS — D631 Anemia in chronic kidney disease: Secondary | ICD-10-CM | POA: Diagnosis not present

## 2019-06-08 DIAGNOSIS — N2581 Secondary hyperparathyroidism of renal origin: Secondary | ICD-10-CM | POA: Diagnosis not present

## 2019-06-08 DIAGNOSIS — D509 Iron deficiency anemia, unspecified: Secondary | ICD-10-CM | POA: Diagnosis not present

## 2019-06-09 ENCOUNTER — Other Ambulatory Visit: Payer: Self-pay

## 2019-06-09 ENCOUNTER — Encounter: Payer: Medicare Other | Attending: Physical Medicine & Rehabilitation | Admitting: Physical Medicine & Rehabilitation

## 2019-06-09 ENCOUNTER — Encounter: Payer: Self-pay | Admitting: Physical Medicine & Rehabilitation

## 2019-06-09 VITALS — BP 109/71 | HR 87 | Temp 98.2°F | Ht 78.0 in | Wt 313.0 lb

## 2019-06-09 DIAGNOSIS — N186 End stage renal disease: Secondary | ICD-10-CM | POA: Insufficient documentation

## 2019-06-09 DIAGNOSIS — R269 Unspecified abnormalities of gait and mobility: Secondary | ICD-10-CM | POA: Insufficient documentation

## 2019-06-09 DIAGNOSIS — G6289 Other specified polyneuropathies: Secondary | ICD-10-CM

## 2019-06-09 DIAGNOSIS — E0842 Diabetes mellitus due to underlying condition with diabetic polyneuropathy: Secondary | ICD-10-CM | POA: Diagnosis not present

## 2019-06-09 DIAGNOSIS — I639 Cerebral infarction, unspecified: Secondary | ICD-10-CM | POA: Diagnosis not present

## 2019-06-09 DIAGNOSIS — G479 Sleep disorder, unspecified: Secondary | ICD-10-CM | POA: Diagnosis not present

## 2019-06-09 DIAGNOSIS — Z992 Dependence on renal dialysis: Secondary | ICD-10-CM | POA: Insufficient documentation

## 2019-06-09 MED ORDER — PREGABALIN 100 MG PO CAPS: ORAL_CAPSULE | ORAL | 1 refills | Status: DC

## 2019-06-09 NOTE — Progress Notes (Signed)
Subjective:    Patient ID: [name], male    DOB: 07/01/1961, 58 y.o.   MRN: [id number]  HPI Male with pmh of heart transplant, ESRD, prostate CA, OSA, obesity, CHF, GERD, DM, CVA with residual left hand weakness, presents with peripheral neuropathy.  Initially stated: B/l knees distally and b/l hands. Started after transplant and dialysis in ~2015.  Getting progressively worse.  Worse prior to bed.  HD exacerbates the pain.  Off HD days better. Pins/needles/tingling.  Associated numbness. Lyrica helps slightly.  Denies falls. Pain limits ambulation.   Last clinic visit on 04/14/2019.  Since that time, patient states  Male with pmh of heart transplant, ESRD, prostate CA, OSA, obesity, CHF, GERD, DM, CVA with residual left hand weakness, presents with peripheral neuropathy. He has some benefit with lidocaine ointment and Epson salt baths and heat.  He states he never received additional Lyrica. He continues to take Cymbalta. Denies falls. Sleep is still relatively poor.   Pain Inventory Average Pain 10 Pain Right Now 10 My pain is tingling and aching  In the last 24 hours, has pain interfered with the following? General activity 5 Relation with others 6 Enjoyment of life 6 What TIME of day is your pain at its worst? all Sleep (in general) Poor  Pain is worse with: walking and standing Pain improves with: nothing Relief from Meds: 0  Mobility walk without assistance walk with assistance use a cane how many minutes can you walk? 15 ability to climb steps?  yes do you drive?  yes Do you have any goals in this area?  yes  Function retired I need assistance with the following:  household duties  Neuro/Psych numbness tremor tingling trouble walking depression anxiety  Prior Studies Any changes since last visit?  no  Physicians involved in your care Any changes since last visit?  no   Family History  Problem Relation Age of Onset  . Coronary artery disease  Father        had, PTCA & CABG  . Heart attack Father   . Hypertension Father   . Diabetes Father   . Prostate cancer Father   . Coronary artery disease Mother   . Heart attack Mother   . Hypertension Mother   . Stroke Mother   . Lupus Mother   . Prostate cancer Brother   . Coronary artery disease Brother   . Coronary artery disease Sister   . Heart attack Sister   . Prostate cancer Paternal Uncle   . Coronary artery disease Maternal Grandmother   . Coronary artery disease Maternal Grandfather   . Coronary artery disease Paternal Grandmother   . Coronary artery disease Paternal Grandfather    Social History   Socioeconomic History  . Marital status: Married    Spouse name: Nicki Reaper  . Number of children: 1  . Years of education: 4  . Highest education level: Not on file  Occupational History  . 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 Financial resource strain: Not on file  . Food insecurity    Worry: Not on file    Inability: Not on file  . Transportation needs    Medical: Not on file    Non-medical: Not on file  Tobacco Use  . Smoking status: Never Smoker  . Smokeless tobacco: Former Systems developer    Types: Snuff  Substance and Sexual Activity  . Alcohol use: No  . Drug use: No  .  Sexual activity: Not Currently  Lifestyle  . Physical activity    Days per week: Not on file    Minutes per session: Not on file  . Stress: Not on file  Relationships  . Social Herbalist on phone: Not on file    Gets together: Not on file    Attends religious service: Not on file    Active member of club or organization: Not on file    Attends meetings of clubs or organizations: Not on file    Relationship status: Not on file  Other Topics Concern  . Not on file  Social History Narrative   Lives with wife in a one story home.  Has one child.     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    Education: college.     Past Surgical History:  Procedure Laterality Date  . CARDIAC PACEMAKER PLACEMENT  09/21/2009   Biventricular implantable cardioverter-defibrillator implantation     . COLONOSCOPY W/ BIOPSIES AND POLYPECTOMY    . FOOT SURGERY     left  . HEART TRANSPLANT  2014  . LAPAROSCOPIC GASTRIC BANDING  01/27/2007  . LEFT VENTRICULAR ASSIST DEVICE     implanted at [facility]  . PROSTATE BIOPSY    . TOTAL HIP ARTHROPLASTY Right 07/22/2017   Procedure: RIGHT TOTAL HIP ARTHROPLASTY ANTERIOR APPROACH;  Surgeon: Rod Can, MD;  Location: [place];  Service: Orthopedics;  Laterality: Right;  Needs RNFA  . TOTAL KNEE ARTHROPLASTY Right 07/22/2017   Past Medical History:  Diagnosis Date  . Arthritis   . Atrial fibrillation ([place])   . CHF (congestive heart failure), NYHA class III (HCC)    s/p heart transplant  . Chronic kidney disease    end stage  . Chronic systolic dysfunction of left ventricle   . CVA (cerebral infarction)   . Diabetes mellitus    PMH; Prior to heart transplant  . Family history of coronary artery disease    in both parents  . GERD (gastroesophageal reflux disease)   . Hyperlipidemia   . Hypertension   . Left bundle branch block   . Morbid obesity ([place])    status post lap band  . Myocardial infarction [facility])    prior to heart transplant  . Nonischemic cardiomyopathy ([place])    prior to heart transplant  . Obesity (BMI 30-39.9)   . Obstructive sleep apnea    no longer needs CPAP after heart transplant per pt  . Premature ventricular contractions   . Prostate cancer ([place])   . SOB (shortness of breath)    BP 109/71   Pulse 87   Temp 98.2 F (36.8 C)   Ht 6\' 6"  (1.981 m)   Wt (!) 313 lb (142 kg)   SpO2 97%   BMI 36.17 kg/m   Opioid Risk Score:   Fall Risk Score:  `1  Depression screen PHQ 2/9  No flowsheet data found.   Review of Systems  Constitutional: Negative.   HENT: Negative.   Eyes: Negative.   Respiratory: Negative.   Cardiovascular: Negative.    Gastrointestinal: Negative.   Endocrine: Negative.   Genitourinary: Negative.   Musculoskeletal: Positive for arthralgias, gait problem and myalgias.  Skin: Negative.   Allergic/Immunologic: Negative.   Neurological: Positive for tremors and numbness.       Tingling  Hematological: Negative.   Psychiatric/Behavioral: Positive for dysphoric mood. The patient is nervous/anxious.        Objective:  Physical Exam Constitutional: No distress . Vital signs reviewed. HENT: Normocephalic. Atraumatic.  Eyes: EOMI. No discharge. Cardiovascular: No JVD. Respiratory: Normal effort. No stridor. GI: Non-distended. Skin: Warm and dry. Intact. Psych: Normal mood. Normal behavior. Musc: No edema. No tenderness. Neuro: Alert Motor: B/l LE 4/5 Sensation diminished to light touch distal to b/l knees    Assessment & Plan:  Male with pmh of heart transplant, ESRD, prostate CA, OSA, obesity, CHF, GERD, DM, CVA with residual left hand weakness, presents with peripheral neuropathy.   1. Polyneuropathy - multifactorial related to DM, HD  Cont Heat  Cont Lidocaine ointment  Cont Epson salt baths  Will consider trial TENS  Changed Lyrica to 75 daily with additional 100mg  post HD, did not receive previously, will reorder  Cont Cymbalta  Will consider Robaxin   Will consider Referral to Psychology  Patient states main goal is to ambulate more  Will consider Capsicin, baths  2. Gait abnormality  Cont cane for safety  3. Sleep disturbance  See #1  Will consider Elavil  No longer requires CPAP  4. Morbid Obesity  Will consider referral to dietitian

## 2019-06-10 DIAGNOSIS — Z992 Dependence on renal dialysis: Secondary | ICD-10-CM | POA: Diagnosis not present

## 2019-06-10 DIAGNOSIS — N186 End stage renal disease: Secondary | ICD-10-CM | POA: Diagnosis not present

## 2019-06-10 DIAGNOSIS — D631 Anemia in chronic kidney disease: Secondary | ICD-10-CM | POA: Diagnosis not present

## 2019-06-10 DIAGNOSIS — E1129 Type 2 diabetes mellitus with other diabetic kidney complication: Secondary | ICD-10-CM | POA: Diagnosis not present

## 2019-06-10 DIAGNOSIS — D509 Iron deficiency anemia, unspecified: Secondary | ICD-10-CM | POA: Diagnosis not present

## 2019-06-10 DIAGNOSIS — N2581 Secondary hyperparathyroidism of renal origin: Secondary | ICD-10-CM | POA: Diagnosis not present

## 2019-06-12 DIAGNOSIS — D631 Anemia in chronic kidney disease: Secondary | ICD-10-CM | POA: Diagnosis not present

## 2019-06-12 DIAGNOSIS — N2581 Secondary hyperparathyroidism of renal origin: Secondary | ICD-10-CM | POA: Diagnosis not present

## 2019-06-12 DIAGNOSIS — Z992 Dependence on renal dialysis: Secondary | ICD-10-CM | POA: Diagnosis not present

## 2019-06-12 DIAGNOSIS — E1129 Type 2 diabetes mellitus with other diabetic kidney complication: Secondary | ICD-10-CM | POA: Diagnosis not present

## 2019-06-12 DIAGNOSIS — D509 Iron deficiency anemia, unspecified: Secondary | ICD-10-CM | POA: Diagnosis not present

## 2019-06-12 DIAGNOSIS — N186 End stage renal disease: Secondary | ICD-10-CM | POA: Diagnosis not present

## 2019-06-15 DIAGNOSIS — E1129 Type 2 diabetes mellitus with other diabetic kidney complication: Secondary | ICD-10-CM | POA: Diagnosis not present

## 2019-06-15 DIAGNOSIS — D509 Iron deficiency anemia, unspecified: Secondary | ICD-10-CM | POA: Diagnosis not present

## 2019-06-15 DIAGNOSIS — N2581 Secondary hyperparathyroidism of renal origin: Secondary | ICD-10-CM | POA: Diagnosis not present

## 2019-06-15 DIAGNOSIS — Z992 Dependence on renal dialysis: Secondary | ICD-10-CM | POA: Diagnosis not present

## 2019-06-15 DIAGNOSIS — N186 End stage renal disease: Secondary | ICD-10-CM | POA: Diagnosis not present

## 2019-06-15 DIAGNOSIS — D631 Anemia in chronic kidney disease: Secondary | ICD-10-CM | POA: Diagnosis not present

## 2019-06-17 DIAGNOSIS — D509 Iron deficiency anemia, unspecified: Secondary | ICD-10-CM | POA: Diagnosis not present

## 2019-06-17 DIAGNOSIS — Z992 Dependence on renal dialysis: Secondary | ICD-10-CM | POA: Diagnosis not present

## 2019-06-17 DIAGNOSIS — N186 End stage renal disease: Secondary | ICD-10-CM | POA: Diagnosis not present

## 2019-06-17 DIAGNOSIS — E1129 Type 2 diabetes mellitus with other diabetic kidney complication: Secondary | ICD-10-CM | POA: Diagnosis not present

## 2019-06-17 DIAGNOSIS — D631 Anemia in chronic kidney disease: Secondary | ICD-10-CM | POA: Diagnosis not present

## 2019-06-17 DIAGNOSIS — N2581 Secondary hyperparathyroidism of renal origin: Secondary | ICD-10-CM | POA: Diagnosis not present

## 2019-06-19 DIAGNOSIS — D509 Iron deficiency anemia, unspecified: Secondary | ICD-10-CM | POA: Diagnosis not present

## 2019-06-19 DIAGNOSIS — E1129 Type 2 diabetes mellitus with other diabetic kidney complication: Secondary | ICD-10-CM | POA: Diagnosis not present

## 2019-06-19 DIAGNOSIS — N186 End stage renal disease: Secondary | ICD-10-CM | POA: Diagnosis not present

## 2019-06-19 DIAGNOSIS — D631 Anemia in chronic kidney disease: Secondary | ICD-10-CM | POA: Diagnosis not present

## 2019-06-19 DIAGNOSIS — Z992 Dependence on renal dialysis: Secondary | ICD-10-CM | POA: Diagnosis not present

## 2019-06-19 DIAGNOSIS — N2581 Secondary hyperparathyroidism of renal origin: Secondary | ICD-10-CM | POA: Diagnosis not present

## 2019-06-22 DIAGNOSIS — D509 Iron deficiency anemia, unspecified: Secondary | ICD-10-CM | POA: Diagnosis not present

## 2019-06-22 DIAGNOSIS — D631 Anemia in chronic kidney disease: Secondary | ICD-10-CM | POA: Diagnosis not present

## 2019-06-22 DIAGNOSIS — N2581 Secondary hyperparathyroidism of renal origin: Secondary | ICD-10-CM | POA: Diagnosis not present

## 2019-06-22 DIAGNOSIS — N186 End stage renal disease: Secondary | ICD-10-CM | POA: Diagnosis not present

## 2019-06-22 DIAGNOSIS — E1129 Type 2 diabetes mellitus with other diabetic kidney complication: Secondary | ICD-10-CM | POA: Diagnosis not present

## 2019-06-22 DIAGNOSIS — Z992 Dependence on renal dialysis: Secondary | ICD-10-CM | POA: Diagnosis not present

## 2019-06-24 DIAGNOSIS — N2581 Secondary hyperparathyroidism of renal origin: Secondary | ICD-10-CM | POA: Diagnosis not present

## 2019-06-24 DIAGNOSIS — Z992 Dependence on renal dialysis: Secondary | ICD-10-CM | POA: Diagnosis not present

## 2019-06-24 DIAGNOSIS — D631 Anemia in chronic kidney disease: Secondary | ICD-10-CM | POA: Diagnosis not present

## 2019-06-24 DIAGNOSIS — E1129 Type 2 diabetes mellitus with other diabetic kidney complication: Secondary | ICD-10-CM | POA: Diagnosis not present

## 2019-06-24 DIAGNOSIS — N186 End stage renal disease: Secondary | ICD-10-CM | POA: Diagnosis not present

## 2019-06-24 DIAGNOSIS — D509 Iron deficiency anemia, unspecified: Secondary | ICD-10-CM | POA: Diagnosis not present

## 2019-06-26 DIAGNOSIS — D509 Iron deficiency anemia, unspecified: Secondary | ICD-10-CM | POA: Diagnosis not present

## 2019-06-26 DIAGNOSIS — N2581 Secondary hyperparathyroidism of renal origin: Secondary | ICD-10-CM | POA: Diagnosis not present

## 2019-06-26 DIAGNOSIS — E1129 Type 2 diabetes mellitus with other diabetic kidney complication: Secondary | ICD-10-CM | POA: Diagnosis not present

## 2019-06-26 DIAGNOSIS — D631 Anemia in chronic kidney disease: Secondary | ICD-10-CM | POA: Diagnosis not present

## 2019-06-26 DIAGNOSIS — Z992 Dependence on renal dialysis: Secondary | ICD-10-CM | POA: Diagnosis not present

## 2019-06-26 DIAGNOSIS — N186 End stage renal disease: Secondary | ICD-10-CM | POA: Diagnosis not present

## 2019-06-28 DIAGNOSIS — Z992 Dependence on renal dialysis: Secondary | ICD-10-CM | POA: Diagnosis not present

## 2019-06-28 DIAGNOSIS — N186 End stage renal disease: Secondary | ICD-10-CM | POA: Diagnosis not present

## 2019-06-28 DIAGNOSIS — T8620 Unspecified complication of heart transplant: Secondary | ICD-10-CM | POA: Diagnosis not present

## 2019-06-29 DIAGNOSIS — N186 End stage renal disease: Secondary | ICD-10-CM | POA: Diagnosis not present

## 2019-06-29 DIAGNOSIS — E1129 Type 2 diabetes mellitus with other diabetic kidney complication: Secondary | ICD-10-CM | POA: Diagnosis not present

## 2019-06-29 DIAGNOSIS — D631 Anemia in chronic kidney disease: Secondary | ICD-10-CM | POA: Diagnosis not present

## 2019-06-29 DIAGNOSIS — N2581 Secondary hyperparathyroidism of renal origin: Secondary | ICD-10-CM | POA: Diagnosis not present

## 2019-06-29 DIAGNOSIS — Z992 Dependence on renal dialysis: Secondary | ICD-10-CM | POA: Diagnosis not present

## 2019-06-29 DIAGNOSIS — D509 Iron deficiency anemia, unspecified: Secondary | ICD-10-CM | POA: Diagnosis not present

## 2019-07-01 DIAGNOSIS — N2581 Secondary hyperparathyroidism of renal origin: Secondary | ICD-10-CM | POA: Diagnosis not present

## 2019-07-01 DIAGNOSIS — D631 Anemia in chronic kidney disease: Secondary | ICD-10-CM | POA: Diagnosis not present

## 2019-07-01 DIAGNOSIS — E1129 Type 2 diabetes mellitus with other diabetic kidney complication: Secondary | ICD-10-CM | POA: Diagnosis not present

## 2019-07-01 DIAGNOSIS — D509 Iron deficiency anemia, unspecified: Secondary | ICD-10-CM | POA: Diagnosis not present

## 2019-07-01 DIAGNOSIS — Z992 Dependence on renal dialysis: Secondary | ICD-10-CM | POA: Diagnosis not present

## 2019-07-01 DIAGNOSIS — N186 End stage renal disease: Secondary | ICD-10-CM | POA: Diagnosis not present

## 2019-07-03 DIAGNOSIS — D631 Anemia in chronic kidney disease: Secondary | ICD-10-CM | POA: Diagnosis not present

## 2019-07-03 DIAGNOSIS — N2581 Secondary hyperparathyroidism of renal origin: Secondary | ICD-10-CM | POA: Diagnosis not present

## 2019-07-03 DIAGNOSIS — N186 End stage renal disease: Secondary | ICD-10-CM | POA: Diagnosis not present

## 2019-07-03 DIAGNOSIS — Z992 Dependence on renal dialysis: Secondary | ICD-10-CM | POA: Diagnosis not present

## 2019-07-03 DIAGNOSIS — D509 Iron deficiency anemia, unspecified: Secondary | ICD-10-CM | POA: Diagnosis not present

## 2019-07-03 DIAGNOSIS — E1129 Type 2 diabetes mellitus with other diabetic kidney complication: Secondary | ICD-10-CM | POA: Diagnosis not present

## 2019-07-06 DIAGNOSIS — Z992 Dependence on renal dialysis: Secondary | ICD-10-CM | POA: Diagnosis not present

## 2019-07-06 DIAGNOSIS — N186 End stage renal disease: Secondary | ICD-10-CM | POA: Diagnosis not present

## 2019-07-06 DIAGNOSIS — D509 Iron deficiency anemia, unspecified: Secondary | ICD-10-CM | POA: Diagnosis not present

## 2019-07-06 DIAGNOSIS — N2581 Secondary hyperparathyroidism of renal origin: Secondary | ICD-10-CM | POA: Diagnosis not present

## 2019-07-06 DIAGNOSIS — E1129 Type 2 diabetes mellitus with other diabetic kidney complication: Secondary | ICD-10-CM | POA: Diagnosis not present

## 2019-07-06 DIAGNOSIS — D631 Anemia in chronic kidney disease: Secondary | ICD-10-CM | POA: Diagnosis not present

## 2019-07-07 ENCOUNTER — Encounter: Payer: Medicare Other | Attending: Physical Medicine & Rehabilitation | Admitting: Physical Medicine & Rehabilitation

## 2019-07-07 ENCOUNTER — Encounter: Payer: Self-pay | Admitting: Physical Medicine & Rehabilitation

## 2019-07-07 ENCOUNTER — Encounter: Payer: Medicare Other | Admitting: Physical Medicine & Rehabilitation

## 2019-07-07 ENCOUNTER — Other Ambulatory Visit: Payer: Self-pay

## 2019-07-07 VITALS — BP 117/74 | HR 84 | Temp 97.5°F | Ht 79.0 in | Wt 302.0 lb

## 2019-07-07 DIAGNOSIS — Z992 Dependence on renal dialysis: Secondary | ICD-10-CM | POA: Diagnosis not present

## 2019-07-07 DIAGNOSIS — G6289 Other specified polyneuropathies: Secondary | ICD-10-CM | POA: Diagnosis not present

## 2019-07-07 DIAGNOSIS — E0842 Diabetes mellitus due to underlying condition with diabetic polyneuropathy: Secondary | ICD-10-CM

## 2019-07-07 DIAGNOSIS — I639 Cerebral infarction, unspecified: Secondary | ICD-10-CM

## 2019-07-07 DIAGNOSIS — G479 Sleep disorder, unspecified: Secondary | ICD-10-CM | POA: Diagnosis not present

## 2019-07-07 DIAGNOSIS — N186 End stage renal disease: Secondary | ICD-10-CM | POA: Diagnosis not present

## 2019-07-07 DIAGNOSIS — R269 Unspecified abnormalities of gait and mobility: Secondary | ICD-10-CM | POA: Diagnosis not present

## 2019-07-07 MED ORDER — PREGABALIN 75 MG PO CAPS: 75.0000 mg | ORAL_CAPSULE | Freq: Every day | ORAL | 1 refills | Status: DC

## 2019-07-07 MED ORDER — PREGABALIN 100 MG PO CAPS: ORAL_CAPSULE | ORAL | 1 refills | Status: DC

## 2019-07-07 NOTE — Progress Notes (Addendum)
Subjective:    Patient ID: [name], male    DOB: 1961-08-07, 58 y.o.   MRN: [id number]  HPI Male with pmh of heart transplant, ESRD, prostate CA, OSA, obesity, CHF, GERD, DM, CVA with res idual left hand weakness, presents with peripheral neuropathy.  Initially stated: B/l knees distally and b/l hands. Started after transplant and dialysis in ~2015.  Getting progressively worse.  Worse prior to bed.  HD exacerbates the pain.  Off HD days better. Pins/needles/tingling.  Associated numbness. Lyrica helps slightly.  Denies falls. Pain limits ambulation.   Last clinic visit on 06/09/2019.  Since that time, he continues to use heat, Lidocaine ointment, epson salt baths. He states he did not have any 75mg  Lyrica to take on non HD days, and has only be taking the 100 post HD. Denies falls.  Pain Inventory Average Pain 10 Pain Right Now 10 My pain is sharp, burning, dull, tingling and aching  In the last 24 hours, has pain interfered with the following? General activity 5 Relation with others 6 Enjoyment of life 7 What TIME of day is your pain at its worst? all Sleep (in general) Poor  Pain is worse with: walking, bending, sitting and standing Pain improves with: medication Relief from Meds: 6  Mobility walk without assistance walk with assistance use a cane how many minutes can you walk? 10 ability to climb steps?  yes do you drive?  yes Do you have any goals in this area?  yes  Function retired I need assistance with the following:  household duties Do you have any goals in this area?  yes  Neuro/Psych numbness tingling trouble walking depression anxiety  Prior Studies Any changes since last visit?  no  Physicians involved in your care Any changes since last visit?  no   Family History  Problem Relation Age of Onset  . Coronary artery disease Father        had, PTCA & CABG  . Heart attack Father   . Hypertension Father   . Diabetes Father   . Prostate  cancer Father   . Coronary artery disease Mother   . Heart attack Mother   . Hypertension Mother   . Stroke Mother   . Lupus Mother   . Prostate cancer Brother   . Coronary artery disease Brother   . Coronary artery disease Sister   . Heart attack Sister   . Prostate cancer Paternal Uncle   . Coronary artery disease Maternal Grandmother   . Coronary artery disease Maternal Grandfather   . Coronary artery disease Paternal Grandmother   . Coronary artery disease Paternal Grandfather    Social History   Socioeconomic History  . Marital status: Married    Spouse name: Nicki Reaper  . Number of children: 1  . Years of education: 63  . Highest education level: Not on file  Occupational History  . Occupation: retired    Fish farm manager: [employer]  . Financial resource strain: Not on file  . Food insecurity    Worry: Not on file    Inability: Not on file  . Transportation needs    Medical: Not on file    Non-medical: Not on file  Tobacco Use  . Smoking status: Never Smoker  . Smokeless tobacco: Former Systems developer    Types: Snuff  Substance and Sexual Activity  . Alcohol use: No  . Drug use: No  . Sexual activity: Not Currently  Lifestyle  . Physical  activity    Days per week: Not on file    Minutes per session: Not on file  . Stress: Not on file  Relationships  . Social Herbalist on phone: Not on file    Gets together: Not on file    Attends religious service: Not on file    Active member of club or organization: Not on file    Attends meetings of clubs or organizations: Not on file    Relationship status: Not on file  Other Topics Concern  . Not on file  Social History Narrative   Lives with wife in a one story home.  Has one child.     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    Education: college.    Past Surgical History:  Procedure Laterality Date  . CARDIAC PACEMAKER PLACEMENT  09/21/2009   Biventricular  implantable cardioverter-defibrillator implantation     . COLONOSCOPY W/ BIOPSIES AND POLYPECTOMY    . FOOT SURGERY     left  . HEART TRANSPLANT  2014  . LAPAROSCOPIC GASTRIC BANDING  01/27/2007  . LEFT VENTRICULAR ASSIST DEVICE     implanted at [facility]  . PROSTATE BIOPSY    . TOTAL HIP ARTHROPLASTY Right 07/22/2017   Procedure: RIGHT TOTAL HIP ARTHROPLASTY ANTERIOR APPROACH;  Surgeon: Rod Can, MD;  Location: [place];  Service: Orthopedics;  Laterality: Right;  Needs RNFA  . TOTAL KNEE ARTHROPLASTY Right 07/22/2017   Past Medical History:  Diagnosis Date  . Arthritis   . Atrial fibrillation ([place])   . CHF (congestive heart failure), NYHA class III (HCC)    s/p heart transplant  . Chronic kidney disease    end stage  . Chronic systolic dysfunction of left ventricle   . CVA (cerebral infarction)   . Diabetes mellitus    PMH; Prior to heart transplant  . Family history of coronary artery disease    in both parents  . GERD (gastroesophageal reflux disease)   . Hyperlipidemia   . Hypertension   . Left bundle branch block   . Morbid obesity ([place])    status post lap band  . Myocardial infarction [facility])    prior to heart transplant  . Nonischemic cardiomyopathy ([place])    prior to heart transplant  . Obesity (BMI 30-39.9)   . Obstructive sleep apnea    no longer needs CPAP after heart transplant per pt  . Premature ventricular contractions   . Prostate cancer ([place])   . SOB (shortness of breath)    BP 117/74   Pulse 84   Temp (!) 97.5 F (36.4 C)   Ht 6\' 7"  (2.007 m)   Wt (!) 302 lb (137 kg)   SpO2 95%   BMI 34.02 kg/m   Opioid Risk Score:   Fall Risk Score:  `1  Depression screen PHQ 2/9  No flowsheet data found.   Review of Systems  Constitutional: Negative.   HENT: Negative.   Eyes: Negative.   Respiratory: Negative.   Cardiovascular: Negative.   Gastrointestinal: Negative.   Endocrine: Negative.   Genitourinary: Negative.   Musculoskeletal: Positive  for arthralgias, gait problem and myalgias.  Skin: Negative.   Allergic/Immunologic: Negative.   Neurological: Positive for numbness.       Tingling  Hematological: Negative.   Psychiatric/Behavioral: Positive for dysphoric mood. The patient is nervous/anxious.        Objective:   Physical Exam  Constitutional: No distress . Vital signs  reviewed. HENT: Normocephalic.  Atraumatic. Eyes: EOMI. No discharge. Cardiovascular: No JVD. Respiratory: Normal effort.  No stridor. GI: Non-distended. Skin: Warm and dry.  Intact. Psych: Normal mood.  Normal behavior. Musc: No edema in extremities.  No tenderness in extremities. Gait: Antalgic Neuro: Alert Motor: B/l LE 4/5 Sensation diminished to light touch distal to b/l knees    Assessment & Plan:  Male with pmh of heart transplant, ESRD, prostate CA, OSA, obesity, CHF, GERD, DM, CVA with residual left hand weakness, presents with peripheral neuropathy.   1. Polyneuropathy - multifactorial related to DM, HD  Cont Heat  Cont Lidocaine ointment  Cont Epson salt baths  Will consider trial TENS  Changed Lyrica to 75 daily with additional 100mg  post HD, still having problems with obtain, will order again  Cont Cymbalta  Will consider Robaxin   Will consider Referral to Psychology  Patient states main goal is to ambulate more  Will consider Capsicin, baths  2. Gait abnormality  Cont cane for safety  3. Sleep disturbance  See #1  Will consider Elavil  No longer requires CPAP  4. Morbid Obesity  Cont follow up with dietitian at HD  Says losing weight

## 2019-07-08 DIAGNOSIS — Z992 Dependence on renal dialysis: Secondary | ICD-10-CM | POA: Diagnosis not present

## 2019-07-08 DIAGNOSIS — D631 Anemia in chronic kidney disease: Secondary | ICD-10-CM | POA: Diagnosis not present

## 2019-07-08 DIAGNOSIS — N2581 Secondary hyperparathyroidism of renal origin: Secondary | ICD-10-CM | POA: Diagnosis not present

## 2019-07-08 DIAGNOSIS — N186 End stage renal disease: Secondary | ICD-10-CM | POA: Diagnosis not present

## 2019-07-08 DIAGNOSIS — D509 Iron deficiency anemia, unspecified: Secondary | ICD-10-CM | POA: Diagnosis not present

## 2019-07-08 DIAGNOSIS — E1129 Type 2 diabetes mellitus with other diabetic kidney complication: Secondary | ICD-10-CM | POA: Diagnosis not present

## 2019-07-09 DIAGNOSIS — Z992 Dependence on renal dialysis: Secondary | ICD-10-CM | POA: Diagnosis not present

## 2019-07-09 DIAGNOSIS — T451X5A Adverse effect of antineoplastic and immunosuppressive drugs, initial encounter: Secondary | ICD-10-CM | POA: Diagnosis not present

## 2019-07-09 DIAGNOSIS — Z7682 Awaiting organ transplant status: Secondary | ICD-10-CM | POA: Diagnosis not present

## 2019-07-09 DIAGNOSIS — N186 End stage renal disease: Secondary | ICD-10-CM | POA: Diagnosis not present

## 2019-07-10 DIAGNOSIS — D509 Iron deficiency anemia, unspecified: Secondary | ICD-10-CM | POA: Diagnosis not present

## 2019-07-10 DIAGNOSIS — D631 Anemia in chronic kidney disease: Secondary | ICD-10-CM | POA: Diagnosis not present

## 2019-07-10 DIAGNOSIS — N186 End stage renal disease: Secondary | ICD-10-CM | POA: Diagnosis not present

## 2019-07-10 DIAGNOSIS — Z992 Dependence on renal dialysis: Secondary | ICD-10-CM | POA: Diagnosis not present

## 2019-07-10 DIAGNOSIS — N2581 Secondary hyperparathyroidism of renal origin: Secondary | ICD-10-CM | POA: Diagnosis not present

## 2019-07-10 DIAGNOSIS — E1129 Type 2 diabetes mellitus with other diabetic kidney complication: Secondary | ICD-10-CM | POA: Diagnosis not present

## 2019-07-13 DIAGNOSIS — D631 Anemia in chronic kidney disease: Secondary | ICD-10-CM | POA: Diagnosis not present

## 2019-07-13 DIAGNOSIS — N186 End stage renal disease: Secondary | ICD-10-CM | POA: Diagnosis not present

## 2019-07-13 DIAGNOSIS — Z992 Dependence on renal dialysis: Secondary | ICD-10-CM | POA: Diagnosis not present

## 2019-07-13 DIAGNOSIS — N2581 Secondary hyperparathyroidism of renal origin: Secondary | ICD-10-CM | POA: Diagnosis not present

## 2019-07-13 DIAGNOSIS — D509 Iron deficiency anemia, unspecified: Secondary | ICD-10-CM | POA: Diagnosis not present

## 2019-07-13 DIAGNOSIS — E1129 Type 2 diabetes mellitus with other diabetic kidney complication: Secondary | ICD-10-CM | POA: Diagnosis not present

## 2019-07-15 DIAGNOSIS — D509 Iron deficiency anemia, unspecified: Secondary | ICD-10-CM | POA: Diagnosis not present

## 2019-07-15 DIAGNOSIS — E1129 Type 2 diabetes mellitus with other diabetic kidney complication: Secondary | ICD-10-CM | POA: Diagnosis not present

## 2019-07-15 DIAGNOSIS — N2581 Secondary hyperparathyroidism of renal origin: Secondary | ICD-10-CM | POA: Diagnosis not present

## 2019-07-15 DIAGNOSIS — D631 Anemia in chronic kidney disease: Secondary | ICD-10-CM | POA: Diagnosis not present

## 2019-07-15 DIAGNOSIS — N186 End stage renal disease: Secondary | ICD-10-CM | POA: Diagnosis not present

## 2019-07-15 DIAGNOSIS — Z992 Dependence on renal dialysis: Secondary | ICD-10-CM | POA: Diagnosis not present

## 2019-07-17 DIAGNOSIS — Z992 Dependence on renal dialysis: Secondary | ICD-10-CM | POA: Diagnosis not present

## 2019-07-17 DIAGNOSIS — D631 Anemia in chronic kidney disease: Secondary | ICD-10-CM | POA: Diagnosis not present

## 2019-07-17 DIAGNOSIS — N2581 Secondary hyperparathyroidism of renal origin: Secondary | ICD-10-CM | POA: Diagnosis not present

## 2019-07-17 DIAGNOSIS — N186 End stage renal disease: Secondary | ICD-10-CM | POA: Diagnosis not present

## 2019-07-17 DIAGNOSIS — E1129 Type 2 diabetes mellitus with other diabetic kidney complication: Secondary | ICD-10-CM | POA: Diagnosis not present

## 2019-07-17 DIAGNOSIS — D509 Iron deficiency anemia, unspecified: Secondary | ICD-10-CM | POA: Diagnosis not present

## 2019-07-19 DIAGNOSIS — N186 End stage renal disease: Secondary | ICD-10-CM | POA: Diagnosis not present

## 2019-07-19 DIAGNOSIS — N2581 Secondary hyperparathyroidism of renal origin: Secondary | ICD-10-CM | POA: Diagnosis not present

## 2019-07-19 DIAGNOSIS — D631 Anemia in chronic kidney disease: Secondary | ICD-10-CM | POA: Diagnosis not present

## 2019-07-19 DIAGNOSIS — E1129 Type 2 diabetes mellitus with other diabetic kidney complication: Secondary | ICD-10-CM | POA: Diagnosis not present

## 2019-07-19 DIAGNOSIS — Z992 Dependence on renal dialysis: Secondary | ICD-10-CM | POA: Diagnosis not present

## 2019-07-19 DIAGNOSIS — D509 Iron deficiency anemia, unspecified: Secondary | ICD-10-CM | POA: Diagnosis not present

## 2019-07-21 DIAGNOSIS — N186 End stage renal disease: Secondary | ICD-10-CM | POA: Diagnosis not present

## 2019-07-21 DIAGNOSIS — N2581 Secondary hyperparathyroidism of renal origin: Secondary | ICD-10-CM | POA: Diagnosis not present

## 2019-07-21 DIAGNOSIS — Z992 Dependence on renal dialysis: Secondary | ICD-10-CM | POA: Diagnosis not present

## 2019-07-21 DIAGNOSIS — E1129 Type 2 diabetes mellitus with other diabetic kidney complication: Secondary | ICD-10-CM | POA: Diagnosis not present

## 2019-07-21 DIAGNOSIS — D631 Anemia in chronic kidney disease: Secondary | ICD-10-CM | POA: Diagnosis not present

## 2019-07-21 DIAGNOSIS — D509 Iron deficiency anemia, unspecified: Secondary | ICD-10-CM | POA: Diagnosis not present

## 2019-07-24 DIAGNOSIS — D509 Iron deficiency anemia, unspecified: Secondary | ICD-10-CM | POA: Diagnosis not present

## 2019-07-24 DIAGNOSIS — N2581 Secondary hyperparathyroidism of renal origin: Secondary | ICD-10-CM | POA: Diagnosis not present

## 2019-07-24 DIAGNOSIS — E1129 Type 2 diabetes mellitus with other diabetic kidney complication: Secondary | ICD-10-CM | POA: Diagnosis not present

## 2019-07-24 DIAGNOSIS — N186 End stage renal disease: Secondary | ICD-10-CM | POA: Diagnosis not present

## 2019-07-24 DIAGNOSIS — D631 Anemia in chronic kidney disease: Secondary | ICD-10-CM | POA: Diagnosis not present

## 2019-07-24 DIAGNOSIS — Z992 Dependence on renal dialysis: Secondary | ICD-10-CM | POA: Diagnosis not present

## 2019-07-27 DIAGNOSIS — N186 End stage renal disease: Secondary | ICD-10-CM | POA: Diagnosis not present

## 2019-07-27 DIAGNOSIS — D631 Anemia in chronic kidney disease: Secondary | ICD-10-CM | POA: Diagnosis not present

## 2019-07-27 DIAGNOSIS — Z992 Dependence on renal dialysis: Secondary | ICD-10-CM | POA: Diagnosis not present

## 2019-07-27 DIAGNOSIS — E1129 Type 2 diabetes mellitus with other diabetic kidney complication: Secondary | ICD-10-CM | POA: Diagnosis not present

## 2019-07-27 DIAGNOSIS — D509 Iron deficiency anemia, unspecified: Secondary | ICD-10-CM | POA: Diagnosis not present

## 2019-07-27 DIAGNOSIS — N2581 Secondary hyperparathyroidism of renal origin: Secondary | ICD-10-CM | POA: Diagnosis not present

## 2019-08-07 ENCOUNTER — Other Ambulatory Visit: Payer: Self-pay

## 2019-08-07 DIAGNOSIS — Z20822 Contact with and (suspected) exposure to covid-19: Secondary | ICD-10-CM

## 2019-08-07 DIAGNOSIS — Z20828 Contact with and (suspected) exposure to other viral communicable diseases: Secondary | ICD-10-CM | POA: Diagnosis not present

## 2019-08-09 LAB — NOVEL CORONAVIRUS, NAA: SARS-CoV-2, NAA: NOT DETECTED

## 2019-08-09 LAB — SPECIMEN STATUS REPORT

## 2019-08-11 ENCOUNTER — Encounter: Payer: Medicare Other | Attending: Physical Medicine & Rehabilitation | Admitting: Physical Medicine & Rehabilitation

## 2019-08-11 ENCOUNTER — Encounter: Payer: Self-pay | Admitting: Physical Medicine & Rehabilitation

## 2019-08-11 ENCOUNTER — Other Ambulatory Visit: Payer: Self-pay

## 2019-08-11 VITALS — BP 113/73 | HR 83 | Temp 97.9°F | Ht 76.0 in | Wt 309.4 lb

## 2019-08-11 DIAGNOSIS — N186 End stage renal disease: Secondary | ICD-10-CM | POA: Insufficient documentation

## 2019-08-11 DIAGNOSIS — R269 Unspecified abnormalities of gait and mobility: Secondary | ICD-10-CM | POA: Insufficient documentation

## 2019-08-11 DIAGNOSIS — E0842 Diabetes mellitus due to underlying condition with diabetic polyneuropathy: Secondary | ICD-10-CM | POA: Diagnosis not present

## 2019-08-11 DIAGNOSIS — I639 Cerebral infarction, unspecified: Secondary | ICD-10-CM

## 2019-08-11 DIAGNOSIS — G479 Sleep disorder, unspecified: Secondary | ICD-10-CM | POA: Insufficient documentation

## 2019-08-11 DIAGNOSIS — G6289 Other specified polyneuropathies: Secondary | ICD-10-CM | POA: Diagnosis not present

## 2019-08-11 DIAGNOSIS — Z992 Dependence on renal dialysis: Secondary | ICD-10-CM | POA: Insufficient documentation

## 2019-08-11 MED ORDER — PREGABALIN 75 MG PO CAPS: 75.0000 mg | ORAL_CAPSULE | Freq: Every day | ORAL | 1 refills | Status: DC

## 2019-08-11 MED ORDER — PREGABALIN 100 MG PO CAPS: ORAL_CAPSULE | ORAL | 1 refills | Status: DC

## 2019-08-11 NOTE — Progress Notes (Signed)
Subjective:    Patient ID: [name], male    DOB: Mar 02, 1961, 58 y.o.   MRN: [id number]  HPI Male with pmh of heart transplant, ESRD, prostate CA, OSA, obesity, CHF, GERD, DM, CVA with res idual left hand weakness, presents with peripheral neuropathy.  Initially stated: B/l knees distally and b/l hands. Started after transplant and dialysis in ~2015.  Getting progressively worse.  Worse prior to bed.  HD exacerbates the pain.  Off HD days better. Pins/needles/tingling.  Associated numbness. Lyrica helps slightly.  Denies falls. Pain limits ambulation.   Last clinic visit on 07/07/2019.  Since that time, pt states he notes improvement with increase in Lyrica. He notes improvement with Lidocaine patches as well. Denies falls.  Using cane less. He states he continues to lose weight.   Pain Inventory Average Pain 8 Pain Right Now 8 My pain is sharp, burning, dull, stabbing, tingling and aching  In the last 24 hours, has pain interfered with the following? General activity 8 Relation with others 10 Enjoyment of life 10 What TIME of day is your pain at its worst? all Sleep (in general) Poor  Pain is worse with: sitting and unsure Pain improves with: rest Relief from Meds: na  Mobility walk without assistance walk with assistance use a cane how many minutes can you walk? 20 ability to climb steps?  yes do you drive?  yes Do you have any goals in this area?  no  Function retired I need assistance with the following:  household duties Do you have any goals in this area?  yes  Neuro/Psych numbness tingling trouble walking depression anxiety  Prior Studies Any changes since last visit?  no  Physicians involved in your care Any changes since last visit?  no   Family History  Problem Relation Age of Onset  . Coronary artery disease Father        had, PTCA & CABG  . Heart attack Father   . Hypertension Father   . Diabetes Father   . Prostate cancer Father   .  Coronary artery disease Mother   . Heart attack Mother   . Hypertension Mother   . Stroke Mother   . Lupus Mother   . Prostate cancer Brother   . Coronary artery disease Brother   . Coronary artery disease Sister   . Heart attack Sister   . Prostate cancer Paternal Uncle   . Coronary artery disease Maternal Grandmother   . Coronary artery disease Maternal Grandfather   . Coronary artery disease Paternal Grandmother   . Coronary artery disease Paternal Grandfather    Social History   Socioeconomic History  . Marital status: Married    Spouse name: Nicki Reaper  . Number of children: 1  . Years of education: 21  . Highest education level: Not on file  Occupational History  . Occupation: retired    Fish farm manager: [employer]  Tobacco Use  . Smoking status: Never Smoker  . Smokeless tobacco: Former Systems developer    Types: Snuff  Substance and Sexual Activity  . Alcohol use: No  . Drug use: No  . Sexual activity: Not Currently  Other Topics Concern  . Not on file  Social History Narrative   Lives with wife in a one story home.  Has one child.     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    Education: college.    Social Determinants of Health   Financial Resource Strain:   .  Difficulty of Paying Living Expenses: Not on file  Food Insecurity:   . Worried About Charity fundraiser in the Last Year: Not on file  . Ran Out of Food in the Last Year: Not on file  Transportation Needs:   . Lack of Transportation (Medical): Not on file  . Lack of Transportation (Non-Medical): Not on file  Physical Activity:   . Days of Exercise per Week: Not on file  . Minutes of Exercise per Session: Not on file  Stress:   . Feeling of Stress : Not on file  Social Connections:   . Frequency of Communication with Friends and Family: Not on file  . Frequency of Social Gatherings with Friends and Family: Not on file  . Attends Religious Services: Not on file  . Active  Member of Clubs or Organizations: Not on file  . Attends Archivist Meetings: Not on file  . Marital Status: Not on file   Past Surgical History:  Procedure Laterality Date  . CARDIAC PACEMAKER PLACEMENT  09/21/2009   Biventricular implantable cardioverter-defibrillator implantation     . COLONOSCOPY W/ BIOPSIES AND POLYPECTOMY    . FOOT SURGERY     left  . HEART TRANSPLANT  2014  . LAPAROSCOPIC GASTRIC BANDING  01/27/2007  . LEFT VENTRICULAR ASSIST DEVICE     implanted at [facility]  . PROSTATE BIOPSY    . TOTAL HIP ARTHROPLASTY Right 07/22/2017   Procedure: RIGHT TOTAL HIP ARTHROPLASTY ANTERIOR APPROACH;  Surgeon: Rod Can, MD;  Location: [place];  Service: Orthopedics;  Laterality: Right;  Needs RNFA  . TOTAL KNEE ARTHROPLASTY Right 07/22/2017   Past Medical History:  Diagnosis Date  . Arthritis   . Atrial fibrillation ([place])   . CHF (congestive heart failure), NYHA class III (HCC)    s/p heart transplant  . Chronic kidney disease    end stage  . Chronic systolic dysfunction of left ventricle   . CVA (cerebral infarction)   . Diabetes mellitus    PMH; Prior to heart transplant  . Family history of coronary artery disease    in both parents  . GERD (gastroesophageal reflux disease)   . Hyperlipidemia   . Hypertension   . Left bundle branch block   . Morbid obesity ([place])    status post lap band  . Myocardial infarction [facility])    prior to heart transplant  . Nonischemic cardiomyopathy ([place])    prior to heart transplant  . Obesity (BMI 30-39.9)   . Obstructive sleep apnea    no longer needs CPAP after heart transplant per pt  . Premature ventricular contractions   . Prostate cancer ([place])   . SOB (shortness of breath)    BP 113/73   Pulse 83   Temp 97.9 F (36.6 C)   Ht 6\' 4"  (1.93 m)   Wt (!) 309 lb 6.4 oz (140.3 kg)   SpO2 94%   BMI 37.66 kg/m   Opioid Risk Score:   Fall Risk Score:  `1  Depression screen PHQ 2/9  No flowsheet data  found.   Review of Systems  Constitutional: Negative.   HENT: Negative.   Eyes: Negative.   Respiratory: Negative.   Cardiovascular: Negative.   Gastrointestinal: Negative.   Endocrine: Negative.   Genitourinary: Negative.   Musculoskeletal: Positive for arthralgias, gait problem and myalgias.  Skin: Negative.   Allergic/Immunologic: Negative.   Neurological: Positive for numbness.       Tingling  Hematological: Negative.  Psychiatric/Behavioral: Positive for dysphoric mood. The patient is nervous/anxious.       Objective:   Physical Exam  Constitutional: No distress . Vital signs reviewed. HENT: Normocephalic.  Atraumatic. Eyes: EOMI. No discharge. Cardiovascular: No JVD. Respiratory: Normal effort.  No stridor. GI: Non-distended. Skin: Warm and dry.  Intact. Psych: Normal mood.  Normal behavior. Musc: No edema in extremities.  No tenderness in extremities. Gait: Antalgic Neuro: Alert Motor: B/l LE 4/5 Sensation diminished to light touch distal to b/l knees, unchanged    Assessment & Plan:  Male with pmh of heart transplant, ESRD, prostate CA, OSA, obesity, CHF, GERD, DM, CVA with residual left hand weakness, presents with peripheral neuropathy.   1. Polyneuropathy - multifactorial related to DM, HD  Cont Heat  Cont Lidocaine ointment/patch  Cont Epson salt baths  Will consider trial TENS  Cont Lyrica to 75 daily with additional 100mg  post HD  Cont Cymbalta  Will consider Robaxin   Will consider Referral to Psychology  Patient states main goal is to ambulate more  Will consider Capsicin, baths  2. Gait abnormality  Cont cane for safety, less reliant  3. Sleep disturbance  See #1  Will consider Elavil  No longer requires CPAP  4. Morbid Obesity  Cont follow up with dietitian at HD  Says continues to lose weight

## 2019-08-28 DIAGNOSIS — T8620 Unspecified complication of heart transplant: Secondary | ICD-10-CM | POA: Diagnosis not present

## 2019-08-28 DIAGNOSIS — Z992 Dependence on renal dialysis: Secondary | ICD-10-CM | POA: Diagnosis not present

## 2019-08-28 DIAGNOSIS — N186 End stage renal disease: Secondary | ICD-10-CM | POA: Diagnosis not present

## 2019-08-29 DIAGNOSIS — D631 Anemia in chronic kidney disease: Secondary | ICD-10-CM | POA: Diagnosis not present

## 2019-08-29 DIAGNOSIS — E1129 Type 2 diabetes mellitus with other diabetic kidney complication: Secondary | ICD-10-CM | POA: Diagnosis not present

## 2019-08-29 DIAGNOSIS — N2581 Secondary hyperparathyroidism of renal origin: Secondary | ICD-10-CM | POA: Diagnosis not present

## 2019-08-29 DIAGNOSIS — Z992 Dependence on renal dialysis: Secondary | ICD-10-CM | POA: Diagnosis not present

## 2019-08-29 DIAGNOSIS — N186 End stage renal disease: Secondary | ICD-10-CM | POA: Diagnosis not present

## 2019-08-29 DIAGNOSIS — D509 Iron deficiency anemia, unspecified: Secondary | ICD-10-CM | POA: Diagnosis not present

## 2019-08-31 DIAGNOSIS — E1129 Type 2 diabetes mellitus with other diabetic kidney complication: Secondary | ICD-10-CM | POA: Diagnosis not present

## 2019-08-31 DIAGNOSIS — Z992 Dependence on renal dialysis: Secondary | ICD-10-CM | POA: Diagnosis not present

## 2019-08-31 DIAGNOSIS — D509 Iron deficiency anemia, unspecified: Secondary | ICD-10-CM | POA: Diagnosis not present

## 2019-08-31 DIAGNOSIS — D631 Anemia in chronic kidney disease: Secondary | ICD-10-CM | POA: Diagnosis not present

## 2019-08-31 DIAGNOSIS — N186 End stage renal disease: Secondary | ICD-10-CM | POA: Diagnosis not present

## 2019-08-31 DIAGNOSIS — N2581 Secondary hyperparathyroidism of renal origin: Secondary | ICD-10-CM | POA: Diagnosis not present

## 2019-09-02 DIAGNOSIS — D631 Anemia in chronic kidney disease: Secondary | ICD-10-CM | POA: Diagnosis not present

## 2019-09-02 DIAGNOSIS — N2581 Secondary hyperparathyroidism of renal origin: Secondary | ICD-10-CM | POA: Diagnosis not present

## 2019-09-02 DIAGNOSIS — N186 End stage renal disease: Secondary | ICD-10-CM | POA: Diagnosis not present

## 2019-09-02 DIAGNOSIS — Z992 Dependence on renal dialysis: Secondary | ICD-10-CM | POA: Diagnosis not present

## 2019-09-02 DIAGNOSIS — D509 Iron deficiency anemia, unspecified: Secondary | ICD-10-CM | POA: Diagnosis not present

## 2019-09-02 DIAGNOSIS — E1129 Type 2 diabetes mellitus with other diabetic kidney complication: Secondary | ICD-10-CM | POA: Diagnosis not present

## 2019-09-04 DIAGNOSIS — Z992 Dependence on renal dialysis: Secondary | ICD-10-CM | POA: Diagnosis not present

## 2019-09-04 DIAGNOSIS — N186 End stage renal disease: Secondary | ICD-10-CM | POA: Diagnosis not present

## 2019-09-04 DIAGNOSIS — D509 Iron deficiency anemia, unspecified: Secondary | ICD-10-CM | POA: Diagnosis not present

## 2019-09-04 DIAGNOSIS — N2581 Secondary hyperparathyroidism of renal origin: Secondary | ICD-10-CM | POA: Diagnosis not present

## 2019-09-04 DIAGNOSIS — E1129 Type 2 diabetes mellitus with other diabetic kidney complication: Secondary | ICD-10-CM | POA: Diagnosis not present

## 2019-09-04 DIAGNOSIS — D631 Anemia in chronic kidney disease: Secondary | ICD-10-CM | POA: Diagnosis not present

## 2019-09-07 DIAGNOSIS — D631 Anemia in chronic kidney disease: Secondary | ICD-10-CM | POA: Diagnosis not present

## 2019-09-07 DIAGNOSIS — N186 End stage renal disease: Secondary | ICD-10-CM | POA: Diagnosis not present

## 2019-09-07 DIAGNOSIS — Z992 Dependence on renal dialysis: Secondary | ICD-10-CM | POA: Diagnosis not present

## 2019-09-07 DIAGNOSIS — D509 Iron deficiency anemia, unspecified: Secondary | ICD-10-CM | POA: Diagnosis not present

## 2019-09-07 DIAGNOSIS — E1129 Type 2 diabetes mellitus with other diabetic kidney complication: Secondary | ICD-10-CM | POA: Diagnosis not present

## 2019-09-07 DIAGNOSIS — N2581 Secondary hyperparathyroidism of renal origin: Secondary | ICD-10-CM | POA: Diagnosis not present

## 2019-09-08 ENCOUNTER — Encounter: Payer: Medicare Other | Attending: Physical Medicine & Rehabilitation | Admitting: Physical Medicine & Rehabilitation

## 2019-09-08 DIAGNOSIS — G479 Sleep disorder, unspecified: Secondary | ICD-10-CM | POA: Insufficient documentation

## 2019-09-08 DIAGNOSIS — G6289 Other specified polyneuropathies: Secondary | ICD-10-CM | POA: Insufficient documentation

## 2019-09-08 DIAGNOSIS — Z992 Dependence on renal dialysis: Secondary | ICD-10-CM | POA: Insufficient documentation

## 2019-09-08 DIAGNOSIS — R269 Unspecified abnormalities of gait and mobility: Secondary | ICD-10-CM | POA: Insufficient documentation

## 2019-09-08 DIAGNOSIS — N186 End stage renal disease: Secondary | ICD-10-CM | POA: Insufficient documentation

## 2019-09-08 DIAGNOSIS — E0842 Diabetes mellitus due to underlying condition with diabetic polyneuropathy: Secondary | ICD-10-CM | POA: Insufficient documentation

## 2019-09-09 DIAGNOSIS — D509 Iron deficiency anemia, unspecified: Secondary | ICD-10-CM | POA: Diagnosis not present

## 2019-09-09 DIAGNOSIS — D631 Anemia in chronic kidney disease: Secondary | ICD-10-CM | POA: Diagnosis not present

## 2019-09-09 DIAGNOSIS — N2581 Secondary hyperparathyroidism of renal origin: Secondary | ICD-10-CM | POA: Diagnosis not present

## 2019-09-09 DIAGNOSIS — Z992 Dependence on renal dialysis: Secondary | ICD-10-CM | POA: Diagnosis not present

## 2019-09-09 DIAGNOSIS — E1129 Type 2 diabetes mellitus with other diabetic kidney complication: Secondary | ICD-10-CM | POA: Diagnosis not present

## 2019-09-09 DIAGNOSIS — N186 End stage renal disease: Secondary | ICD-10-CM | POA: Diagnosis not present

## 2019-09-11 DIAGNOSIS — N186 End stage renal disease: Secondary | ICD-10-CM | POA: Diagnosis not present

## 2019-09-11 DIAGNOSIS — E1129 Type 2 diabetes mellitus with other diabetic kidney complication: Secondary | ICD-10-CM | POA: Diagnosis not present

## 2019-09-11 DIAGNOSIS — D509 Iron deficiency anemia, unspecified: Secondary | ICD-10-CM | POA: Diagnosis not present

## 2019-09-11 DIAGNOSIS — D631 Anemia in chronic kidney disease: Secondary | ICD-10-CM | POA: Diagnosis not present

## 2019-09-11 DIAGNOSIS — N2581 Secondary hyperparathyroidism of renal origin: Secondary | ICD-10-CM | POA: Diagnosis not present

## 2019-09-11 DIAGNOSIS — Z992 Dependence on renal dialysis: Secondary | ICD-10-CM | POA: Diagnosis not present

## 2019-09-13 IMAGING — CT CT HEAD WITHOUT CONTRAST
4 of 6 series · 17 of 47 positions shown, 19 images · non-contrast
Comparison: 05/15/2012 CT and 09/06/2008 MR

CLINICAL DATA: 58-year-old male with acute LEFT sided weakness for
1 week.

EXAM:
CT HEAD WITHOUT CONTRAST
TECHNIQUE: Contiguous axial images were obtained from the base of the skull
through the vertex without intravenous contrast.

[Series 3: head without · axial · non-contrast · 0.48mm/px · z∈[-98,+17]mm · 5 of 35 slices shown, 7 images]
[im 6/35  brain]
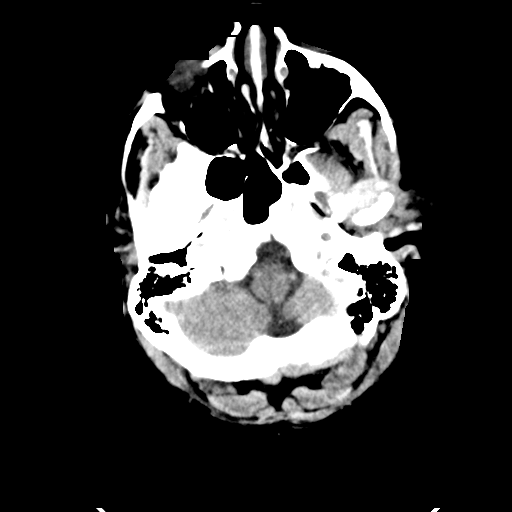
[im 6/35  bone]
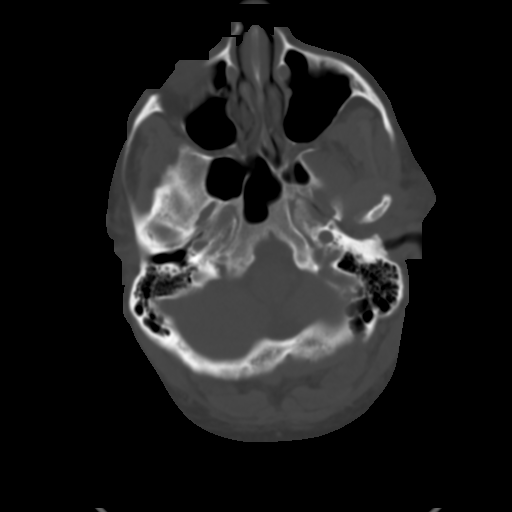
[im 12/35  brain]
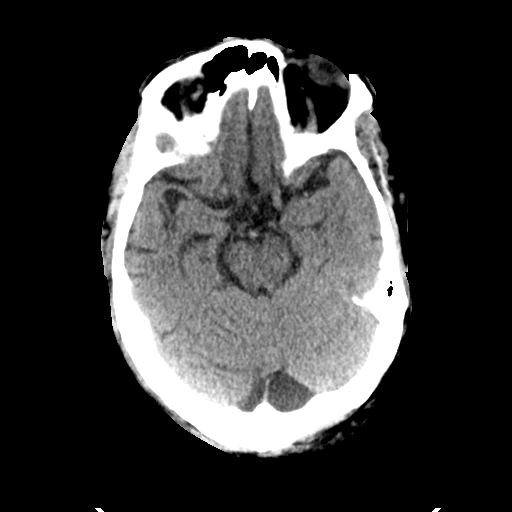
[im 18/35  brain]
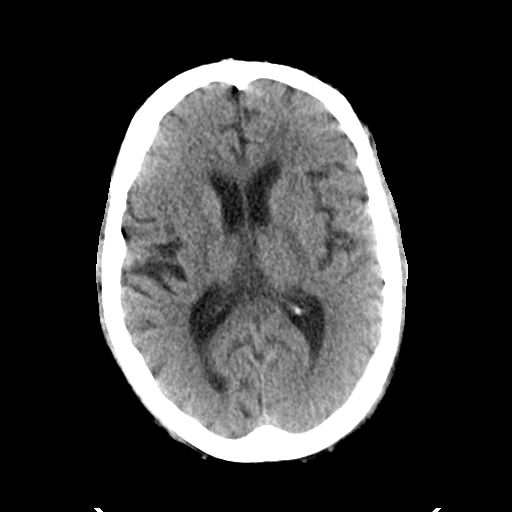
[im 23/35  brain]
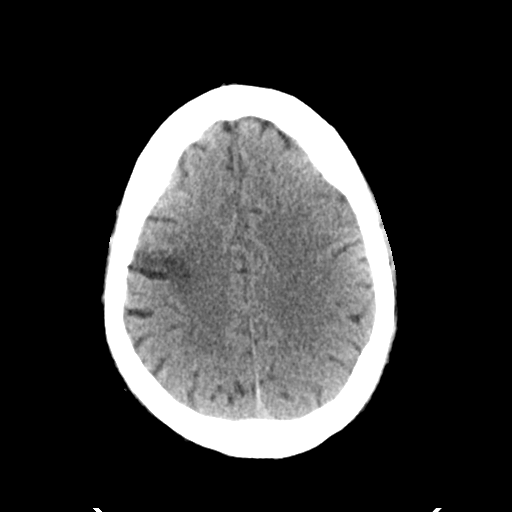
[im 29/35  brain]
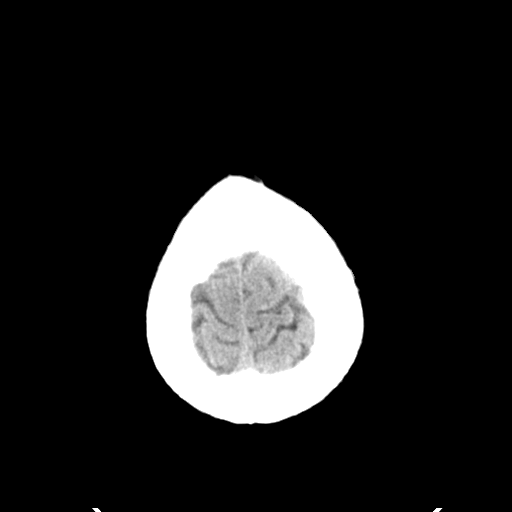
[im 29/35  bone]
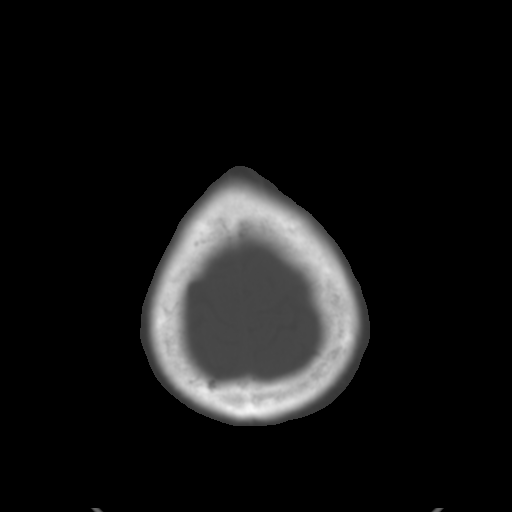

[Series 4: head bone · axial · 0.48mm/px · z∈[-113,-7]mm · 6 of 88 slices shown]
[im 6/88  bone]
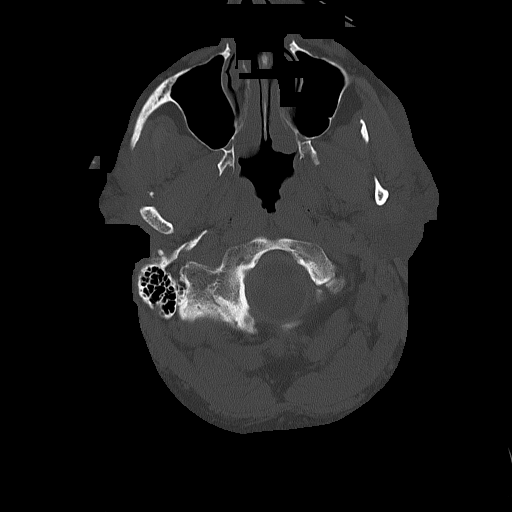
[im 18/88  bone]
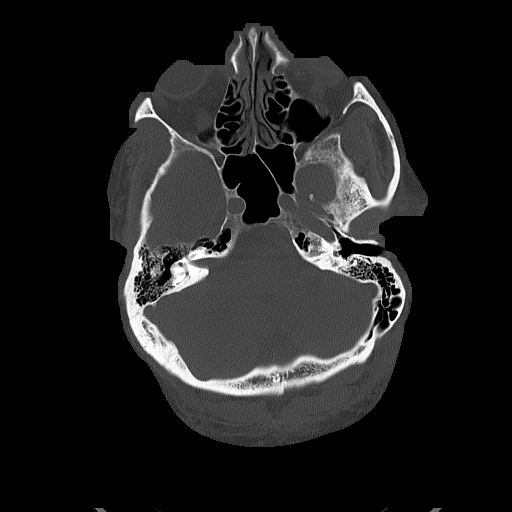
[im 30/88  bone]
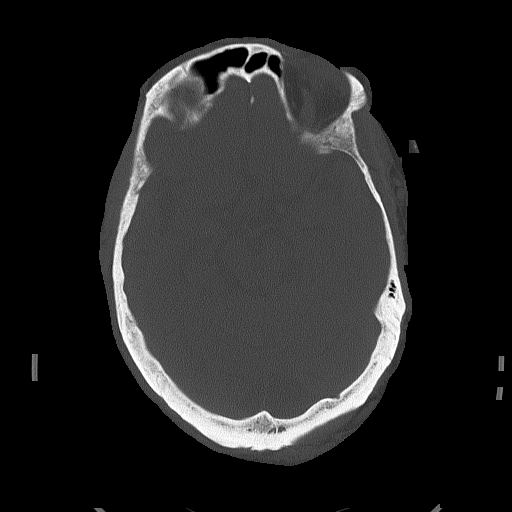
[im 41/88  bone]
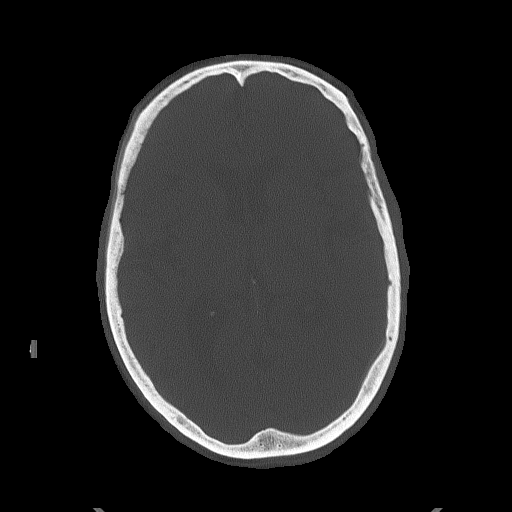
[im 47/88  bone]
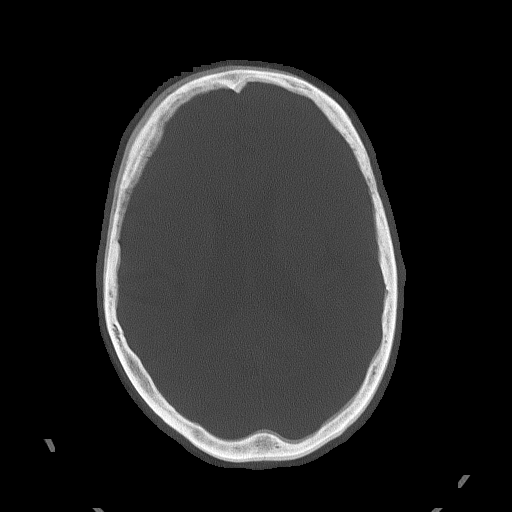
[im 59/88  bone]
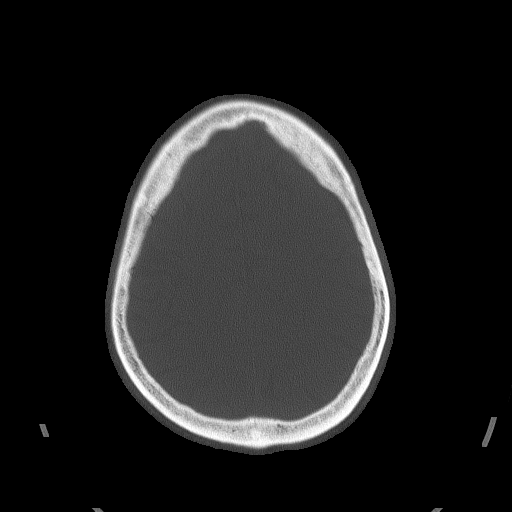

[Series 5: head without cor · coronal · non-contrast · 0.34mm/px · 3 of 73 slices shown]
[im 25/73  brain]
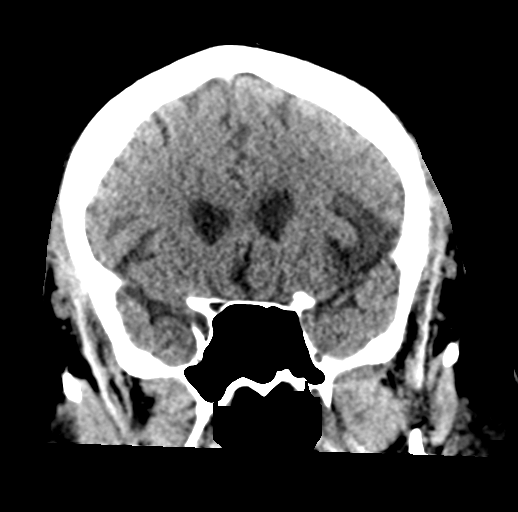
[im 33/73  brain]
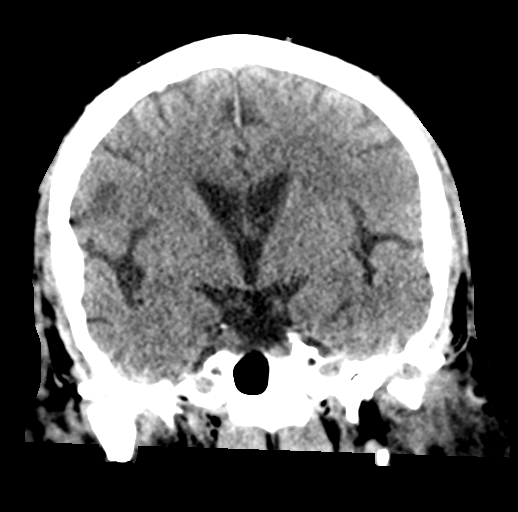
[im 41/73  brain]
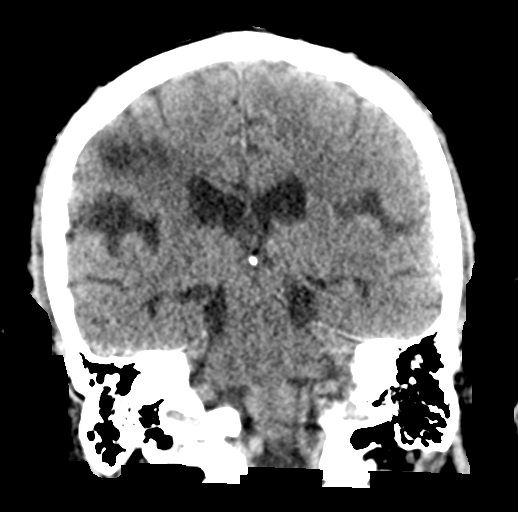

[Series 6: head without sag · sagittal · non-contrast · 0.34mm/px · 3 of 59 slices shown]
[im 20/59  brain]
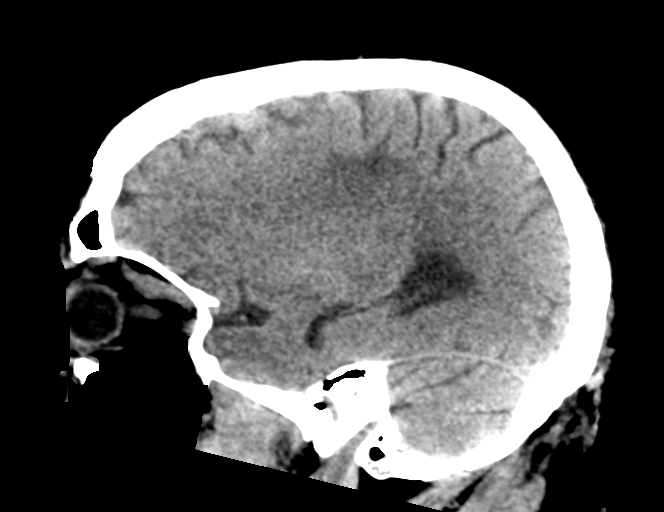
[im 30/59  brain]
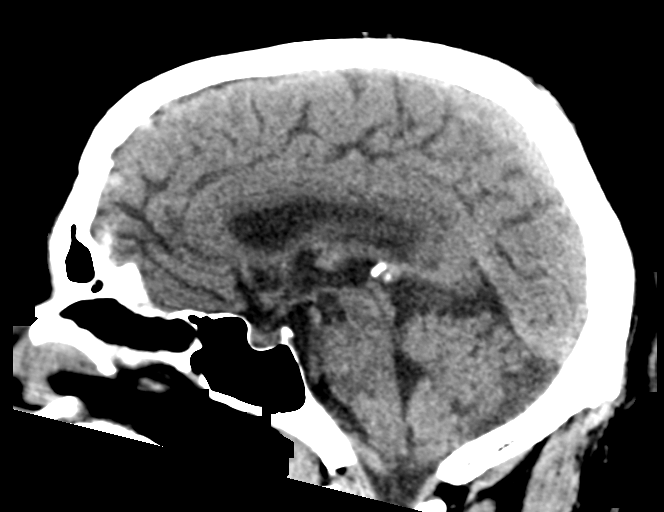
[im 39/59  brain]
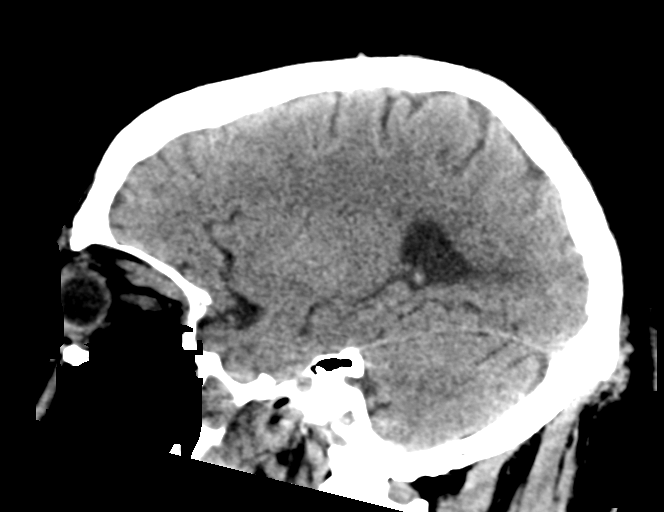

[17 of 47 positions shown; findings below may reference images not displayed]

FINDINGS: Brain: A small to moderate RIGHT frontoparietal infarct is noted
without hemorrhage.

Mild chronic small-vessel white matter ischemic changes noted.

No extra-axial collection, mass lesion, midline shift or
hydrocephalus.

Vascular: No hyperdense vessel or unexpected calcification.

Skull: Normal. Negative for fracture or focal lesion.

Sinuses/Orbits: No acute finding.

Other: None.
IMPRESSION: 1. RIGHT frontoparietal infarct without hemorrhage, likely subacute
given history.
2. Mild chronic small-vessel white matter ischemic changes.

## 2019-09-14 DIAGNOSIS — N2581 Secondary hyperparathyroidism of renal origin: Secondary | ICD-10-CM | POA: Diagnosis not present

## 2019-09-14 DIAGNOSIS — D631 Anemia in chronic kidney disease: Secondary | ICD-10-CM | POA: Diagnosis not present

## 2019-09-14 DIAGNOSIS — D509 Iron deficiency anemia, unspecified: Secondary | ICD-10-CM | POA: Diagnosis not present

## 2019-09-14 DIAGNOSIS — Z992 Dependence on renal dialysis: Secondary | ICD-10-CM | POA: Diagnosis not present

## 2019-09-14 DIAGNOSIS — E1129 Type 2 diabetes mellitus with other diabetic kidney complication: Secondary | ICD-10-CM | POA: Diagnosis not present

## 2019-09-14 DIAGNOSIS — N186 End stage renal disease: Secondary | ICD-10-CM | POA: Diagnosis not present

## 2019-09-16 DIAGNOSIS — N186 End stage renal disease: Secondary | ICD-10-CM | POA: Diagnosis not present

## 2019-09-16 DIAGNOSIS — N2581 Secondary hyperparathyroidism of renal origin: Secondary | ICD-10-CM | POA: Diagnosis not present

## 2019-09-16 DIAGNOSIS — Z992 Dependence on renal dialysis: Secondary | ICD-10-CM | POA: Diagnosis not present

## 2019-09-16 DIAGNOSIS — D631 Anemia in chronic kidney disease: Secondary | ICD-10-CM | POA: Diagnosis not present

## 2019-09-16 DIAGNOSIS — E1129 Type 2 diabetes mellitus with other diabetic kidney complication: Secondary | ICD-10-CM | POA: Diagnosis not present

## 2019-09-16 DIAGNOSIS — D509 Iron deficiency anemia, unspecified: Secondary | ICD-10-CM | POA: Diagnosis not present

## 2019-09-18 DIAGNOSIS — N2581 Secondary hyperparathyroidism of renal origin: Secondary | ICD-10-CM | POA: Diagnosis not present

## 2019-09-18 DIAGNOSIS — E1129 Type 2 diabetes mellitus with other diabetic kidney complication: Secondary | ICD-10-CM | POA: Diagnosis not present

## 2019-09-18 DIAGNOSIS — N186 End stage renal disease: Secondary | ICD-10-CM | POA: Diagnosis not present

## 2019-09-18 DIAGNOSIS — Z992 Dependence on renal dialysis: Secondary | ICD-10-CM | POA: Diagnosis not present

## 2019-09-18 DIAGNOSIS — D631 Anemia in chronic kidney disease: Secondary | ICD-10-CM | POA: Diagnosis not present

## 2019-09-18 DIAGNOSIS — D509 Iron deficiency anemia, unspecified: Secondary | ICD-10-CM | POA: Diagnosis not present

## 2019-09-21 DIAGNOSIS — N186 End stage renal disease: Secondary | ICD-10-CM | POA: Diagnosis not present

## 2019-09-21 DIAGNOSIS — D631 Anemia in chronic kidney disease: Secondary | ICD-10-CM | POA: Diagnosis not present

## 2019-09-21 DIAGNOSIS — E1129 Type 2 diabetes mellitus with other diabetic kidney complication: Secondary | ICD-10-CM | POA: Diagnosis not present

## 2019-09-21 DIAGNOSIS — N2581 Secondary hyperparathyroidism of renal origin: Secondary | ICD-10-CM | POA: Diagnosis not present

## 2019-09-21 DIAGNOSIS — D509 Iron deficiency anemia, unspecified: Secondary | ICD-10-CM | POA: Diagnosis not present

## 2019-09-21 DIAGNOSIS — Z992 Dependence on renal dialysis: Secondary | ICD-10-CM | POA: Diagnosis not present

## 2019-09-22 ENCOUNTER — Telehealth: Payer: Self-pay

## 2019-09-22 NOTE — Telephone Encounter (Signed)
Patient called requesting refill Pregabalin. I called patient back to let him know that he should have a refill at [facility] and call them to refill.

## 2019-09-23 DIAGNOSIS — E1129 Type 2 diabetes mellitus with other diabetic kidney complication: Secondary | ICD-10-CM | POA: Diagnosis not present

## 2019-09-23 DIAGNOSIS — Z992 Dependence on renal dialysis: Secondary | ICD-10-CM | POA: Diagnosis not present

## 2019-09-23 DIAGNOSIS — D509 Iron deficiency anemia, unspecified: Secondary | ICD-10-CM | POA: Diagnosis not present

## 2019-09-23 DIAGNOSIS — N186 End stage renal disease: Secondary | ICD-10-CM | POA: Diagnosis not present

## 2019-09-23 DIAGNOSIS — D631 Anemia in chronic kidney disease: Secondary | ICD-10-CM | POA: Diagnosis not present

## 2019-09-23 DIAGNOSIS — N2581 Secondary hyperparathyroidism of renal origin: Secondary | ICD-10-CM | POA: Diagnosis not present

## 2019-09-24 ENCOUNTER — Other Ambulatory Visit: Payer: Self-pay

## 2019-09-24 ENCOUNTER — Encounter: Payer: Self-pay | Admitting: Physical Medicine & Rehabilitation

## 2019-09-24 ENCOUNTER — Encounter (HOSPITAL_BASED_OUTPATIENT_CLINIC_OR_DEPARTMENT_OTHER): Payer: Medicare Other | Admitting: Physical Medicine & Rehabilitation

## 2019-09-24 VITALS — BP 122/74 | HR 88 | Temp 97.9°F | Ht 77.0 in | Wt 305.0 lb

## 2019-09-24 DIAGNOSIS — N186 End stage renal disease: Secondary | ICD-10-CM | POA: Diagnosis not present

## 2019-09-24 DIAGNOSIS — Z992 Dependence on renal dialysis: Secondary | ICD-10-CM | POA: Diagnosis not present

## 2019-09-24 DIAGNOSIS — E0842 Diabetes mellitus due to underlying condition with diabetic polyneuropathy: Secondary | ICD-10-CM

## 2019-09-24 DIAGNOSIS — G6289 Other specified polyneuropathies: Secondary | ICD-10-CM | POA: Diagnosis not present

## 2019-09-24 DIAGNOSIS — R269 Unspecified abnormalities of gait and mobility: Secondary | ICD-10-CM | POA: Diagnosis not present

## 2019-09-24 DIAGNOSIS — G479 Sleep disorder, unspecified: Secondary | ICD-10-CM | POA: Diagnosis not present

## 2019-09-24 MED ORDER — PREGABALIN 25 MG PO CAPS: ORAL_CAPSULE | ORAL | 1 refills | Status: DC

## 2019-09-24 NOTE — Progress Notes (Signed)
Subjective:    Patient ID: [name], male    DOB: 02-15-1961, 59 y.o.   MRN: [id number]  HPI Male with pmh of heart transplant, ESRD, prostate CA, OSA, obesity, CHF, GERD, DM, CVA with res idual left hand weakness, presents with peripheral neuropathy.  Initially stated: B/l knees distally and b/l hands. Started after transplant and dialysis in ~2015.  Getting progressively worse.  Worse prior to bed.  HD exacerbates the pain.  Off HD days better. Pins/needles/tingling.  Associated numbness. Lyrica helps slightly.  Denies falls. Pain limits ambulation.   Last clinic visit on 08/11/2019.  Since that time, patient states he is doing well with Lyrica. He is ambulating more. He is no longer using a cane. He states he lost 1 lb since last visit.   Pain Inventory Average Pain 7 Pain Right Now 0 My pain is sharp, burning, dull, stabbing, tingling and aching  In the last 24 hours, has pain interfered with the following? General activity 5 Relation with others 5 Enjoyment of life 7 What TIME of day is your pain at its worst? daytime Sleep (in general) Fair  Pain is worse with: unsure Pain improves with: rest, heat/ice and medication Relief from Meds: 5  Mobility walk without assistance Do you have any goals in this area?  yes  Function retired Do you have any goals in this area?  yes  Neuro/Psych numbness tremor tingling spasms dizziness  Prior Studies Any changes since last visit?  no  Physicians involved in your care Any changes since last visit?  no   Family History  Problem Relation Age of Onset  . Coronary artery disease Father        had, PTCA & CABG  . Heart attack Father   . Hypertension Father   . Diabetes Father   . Prostate cancer Father   . Coronary artery disease Mother   . Heart attack Mother   . Hypertension Mother   . Stroke Mother   . Lupus Mother   . Prostate cancer Brother   . Coronary artery disease Brother   . Coronary artery disease  Sister   . Heart attack Sister   . Prostate cancer Paternal Uncle   . Coronary artery disease Maternal Grandmother   . Coronary artery disease Maternal Grandfather   . Coronary artery disease Paternal Grandmother   . Coronary artery disease Paternal Grandfather    Social History   Socioeconomic History  . Marital status: Married    Spouse name: Nicki Reaper  . Number of children: 1  . Years of education: 57  . Highest education level: Not on file  Occupational History  . Occupation: retired    Fish farm manager: [employer]  Tobacco Use  . Smoking status: Never Smoker  . Smokeless tobacco: Former Systems developer    Types: Snuff  Substance and Sexual Activity  . Alcohol use: No  . Drug use: No  . Sexual activity: Not Currently  Other Topics Concern  . Not on file  Social History Narrative   Lives with wife in a one story home.  Has one child.     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    Education: college.    Social Determinants of Health   Financial Resource Strain:   . Difficulty of Paying Living Expenses: Not on file  Food Insecurity:   . Worried About Charity fundraiser in the Last Year: Not on file  . Ran Out of  Food in the Last Year: Not on file  Transportation Needs:   . Lack of Transportation (Medical): Not on file  . Lack of Transportation (Non-Medical): Not on file  Physical Activity:   . Days of Exercise per Week: Not on file  . Minutes of Exercise per Session: Not on file  Stress:   . Feeling of Stress : Not on file  Social Connections:   . Frequency of Communication with Friends and Family: Not on file  . Frequency of Social Gatherings with Friends and Family: Not on file  . Attends Religious Services: Not on file  . Active Member of Clubs or Organizations: Not on file  . Attends Archivist Meetings: Not on file  . Marital Status: Not on file   Past Surgical History:  Procedure Laterality Date  . CARDIAC PACEMAKER  PLACEMENT  09/21/2009   Biventricular implantable cardioverter-defibrillator implantation     . COLONOSCOPY W/ BIOPSIES AND POLYPECTOMY    . FOOT SURGERY     left  . HEART TRANSPLANT  2014  . LAPAROSCOPIC GASTRIC BANDING  01/27/2007  . LEFT VENTRICULAR ASSIST DEVICE     implant[facility] at [facility]  . PROSTATE BIOPSY    . TOTAL HIP ARTHROPLASTY Right 07/22/2017   Procedure: RIGHT TOTAL HIP ARTHROPLASTY ANTERIOR APPROACH;  Surgeon: Rod Can, MD;  Location: [place];  Service: Orthopedics;  Laterality: Right;  Needs RNFA  . TOTAL KNEE ARTHROPLASTY Right 07/22/2017   Past Medical History:  Diagnosis Date  . Arthritis   . Atrial fibrillation ([place])   . CHF (congestive heart failure), NYHA class III (HCC)    s/p heart transplant  . Chronic kidney disease    end stage  . Chronic systolic dysfunction of left ventricle   . CVA (cerebral infarction)   . Diabetes mellitus    PMH; Prior to heart transplant  . Family history of coronary artery disease    in both parents  . GERD (gastroesophageal reflux disease)   . Hyperlipidemia   . Hypertension   . Left bundle branch block   . Morbid obesity ([place])    status post lap band  . Myocardial infarction [facility])    prior to heart transplant  . Nonischemic cardiomyopathy ([place])    prior to heart transplant  . Obesity (BMI 30-39.9)   . Obstructive sleep apnea    no longer needs CPAP after heart transplant per pt  . Premature ventricular contractions   . Prostate cancer ([place])   . SOB (shortness of breath)    BP 122/74   Pulse 88   Temp 97.9 F (36.6 C)   Ht 6\' 5"  (1.956 m)   Wt (!) 305 lb (138.3 kg)   SpO2 97%   BMI 36.17 kg/m   Opioid Risk Score:   Fall Risk Score:  `1  Depression screen PHQ 2/9  No flowsheet data found.   Review of Systems  Constitutional: Negative.   HENT: Negative.   Eyes: Negative.   Respiratory: Negative.   Cardiovascular: Negative.   Gastrointestinal: Negative.   Endocrine: Negative.   Genitourinary:  Negative.   Musculoskeletal: Positive for arthralgias, gait problem and myalgias.  Skin: Negative.   Allergic/Immunologic: Negative.   Neurological: Positive for dizziness, tremors and numbness.       Tingling  Hematological: Negative.       Objective:   Physical Exam  Constitutional: No distress . Vital signs reviewed. HENT: Normocephalic.  Atraumatic. Eyes: EOMI. No discharge. Cardiovascular: No JVD. Respiratory: Normal effort.  No stridor. GI: Non-distended. Skin: Warm and dry.  Intact. Psych: Normal mood.  Normal behavior. Musc: No edema in extremities.  No tenderness in extremities. Gait: Slightly antalgic Neuro: Alert Motor: B/l LE 4/5 Sensation diminished to light touch distal to b/l knees, stable    Assessment & Plan:  Male with pmh of heart transplant, ESRD, prostate CA, OSA, obesity, CHF, GERD, DM, CVA with residual left hand weakness, presents with peripheral neuropathy.   1. Polyneuropathy - multifactorial related to DM, HD  Cont Heat  Cont Lidocaine ointment/patch  Cont Epson salt baths  Will consider trial TENS  Cont Lyrica to 75 daily with increase in additional to 125mg  post HD  Cont Cymbalta  Will consider Robaxin   Will consider Referral to Psychology  Patient states main goal is to ambulate more  Will consider Capsicin, baths  2. Gait abnormality  No longer requires cane  3. Sleep disturbance  See #1  Will consider Elavil  No longer requires CPAP  4. Morbid Obesity  Cont follow up with dietitian at HD  Slowly losing weight, cont to encourage

## 2019-09-25 DIAGNOSIS — Z992 Dependence on renal dialysis: Secondary | ICD-10-CM | POA: Diagnosis not present

## 2019-09-25 DIAGNOSIS — D631 Anemia in chronic kidney disease: Secondary | ICD-10-CM | POA: Diagnosis not present

## 2019-09-25 DIAGNOSIS — D509 Iron deficiency anemia, unspecified: Secondary | ICD-10-CM | POA: Diagnosis not present

## 2019-09-25 DIAGNOSIS — N186 End stage renal disease: Secondary | ICD-10-CM | POA: Diagnosis not present

## 2019-09-25 DIAGNOSIS — E1129 Type 2 diabetes mellitus with other diabetic kidney complication: Secondary | ICD-10-CM | POA: Diagnosis not present

## 2019-09-25 DIAGNOSIS — N2581 Secondary hyperparathyroidism of renal origin: Secondary | ICD-10-CM | POA: Diagnosis not present

## 2019-09-28 DIAGNOSIS — T8620 Unspecified complication of heart transplant: Secondary | ICD-10-CM | POA: Diagnosis not present

## 2019-09-28 DIAGNOSIS — D509 Iron deficiency anemia, unspecified: Secondary | ICD-10-CM | POA: Diagnosis not present

## 2019-09-28 DIAGNOSIS — Z992 Dependence on renal dialysis: Secondary | ICD-10-CM | POA: Diagnosis not present

## 2019-09-28 DIAGNOSIS — E1129 Type 2 diabetes mellitus with other diabetic kidney complication: Secondary | ICD-10-CM | POA: Diagnosis not present

## 2019-09-28 DIAGNOSIS — Z23 Encounter for immunization: Secondary | ICD-10-CM | POA: Diagnosis not present

## 2019-09-28 DIAGNOSIS — D631 Anemia in chronic kidney disease: Secondary | ICD-10-CM | POA: Diagnosis not present

## 2019-09-28 DIAGNOSIS — N2581 Secondary hyperparathyroidism of renal origin: Secondary | ICD-10-CM | POA: Diagnosis not present

## 2019-09-28 DIAGNOSIS — N186 End stage renal disease: Secondary | ICD-10-CM | POA: Diagnosis not present

## 2019-09-30 DIAGNOSIS — E1129 Type 2 diabetes mellitus with other diabetic kidney complication: Secondary | ICD-10-CM | POA: Diagnosis not present

## 2019-09-30 DIAGNOSIS — N2581 Secondary hyperparathyroidism of renal origin: Secondary | ICD-10-CM | POA: Diagnosis not present

## 2019-09-30 DIAGNOSIS — D631 Anemia in chronic kidney disease: Secondary | ICD-10-CM | POA: Diagnosis not present

## 2019-09-30 DIAGNOSIS — D509 Iron deficiency anemia, unspecified: Secondary | ICD-10-CM | POA: Diagnosis not present

## 2019-09-30 DIAGNOSIS — N186 End stage renal disease: Secondary | ICD-10-CM | POA: Diagnosis not present

## 2019-09-30 DIAGNOSIS — Z992 Dependence on renal dialysis: Secondary | ICD-10-CM | POA: Diagnosis not present

## 2019-10-02 DIAGNOSIS — N2581 Secondary hyperparathyroidism of renal origin: Secondary | ICD-10-CM | POA: Diagnosis not present

## 2019-10-02 DIAGNOSIS — D509 Iron deficiency anemia, unspecified: Secondary | ICD-10-CM | POA: Diagnosis not present

## 2019-10-02 DIAGNOSIS — Z992 Dependence on renal dialysis: Secondary | ICD-10-CM | POA: Diagnosis not present

## 2019-10-02 DIAGNOSIS — E1129 Type 2 diabetes mellitus with other diabetic kidney complication: Secondary | ICD-10-CM | POA: Diagnosis not present

## 2019-10-02 DIAGNOSIS — N186 End stage renal disease: Secondary | ICD-10-CM | POA: Diagnosis not present

## 2019-10-02 DIAGNOSIS — D631 Anemia in chronic kidney disease: Secondary | ICD-10-CM | POA: Diagnosis not present

## 2019-10-05 DIAGNOSIS — Z992 Dependence on renal dialysis: Secondary | ICD-10-CM | POA: Diagnosis not present

## 2019-10-05 DIAGNOSIS — E1129 Type 2 diabetes mellitus with other diabetic kidney complication: Secondary | ICD-10-CM | POA: Diagnosis not present

## 2019-10-05 DIAGNOSIS — D631 Anemia in chronic kidney disease: Secondary | ICD-10-CM | POA: Diagnosis not present

## 2019-10-05 DIAGNOSIS — N2581 Secondary hyperparathyroidism of renal origin: Secondary | ICD-10-CM | POA: Diagnosis not present

## 2019-10-05 DIAGNOSIS — D509 Iron deficiency anemia, unspecified: Secondary | ICD-10-CM | POA: Diagnosis not present

## 2019-10-05 DIAGNOSIS — N186 End stage renal disease: Secondary | ICD-10-CM | POA: Diagnosis not present

## 2019-10-07 DIAGNOSIS — N186 End stage renal disease: Secondary | ICD-10-CM | POA: Diagnosis not present

## 2019-10-07 DIAGNOSIS — E1129 Type 2 diabetes mellitus with other diabetic kidney complication: Secondary | ICD-10-CM | POA: Diagnosis not present

## 2019-10-07 DIAGNOSIS — Z992 Dependence on renal dialysis: Secondary | ICD-10-CM | POA: Diagnosis not present

## 2019-10-07 DIAGNOSIS — N2581 Secondary hyperparathyroidism of renal origin: Secondary | ICD-10-CM | POA: Diagnosis not present

## 2019-10-07 DIAGNOSIS — D631 Anemia in chronic kidney disease: Secondary | ICD-10-CM | POA: Diagnosis not present

## 2019-10-07 DIAGNOSIS — D509 Iron deficiency anemia, unspecified: Secondary | ICD-10-CM | POA: Diagnosis not present

## 2019-10-09 DIAGNOSIS — N2581 Secondary hyperparathyroidism of renal origin: Secondary | ICD-10-CM | POA: Diagnosis not present

## 2019-10-09 DIAGNOSIS — Z992 Dependence on renal dialysis: Secondary | ICD-10-CM | POA: Diagnosis not present

## 2019-10-09 DIAGNOSIS — N186 End stage renal disease: Secondary | ICD-10-CM | POA: Diagnosis not present

## 2019-10-09 DIAGNOSIS — E1129 Type 2 diabetes mellitus with other diabetic kidney complication: Secondary | ICD-10-CM | POA: Diagnosis not present

## 2019-10-09 DIAGNOSIS — D631 Anemia in chronic kidney disease: Secondary | ICD-10-CM | POA: Diagnosis not present

## 2019-10-09 DIAGNOSIS — D509 Iron deficiency anemia, unspecified: Secondary | ICD-10-CM | POA: Diagnosis not present

## 2019-10-12 DIAGNOSIS — D509 Iron deficiency anemia, unspecified: Secondary | ICD-10-CM | POA: Diagnosis not present

## 2019-10-12 DIAGNOSIS — N186 End stage renal disease: Secondary | ICD-10-CM | POA: Diagnosis not present

## 2019-10-12 DIAGNOSIS — N2581 Secondary hyperparathyroidism of renal origin: Secondary | ICD-10-CM | POA: Diagnosis not present

## 2019-10-12 DIAGNOSIS — E1129 Type 2 diabetes mellitus with other diabetic kidney complication: Secondary | ICD-10-CM | POA: Diagnosis not present

## 2019-10-12 DIAGNOSIS — D631 Anemia in chronic kidney disease: Secondary | ICD-10-CM | POA: Diagnosis not present

## 2019-10-12 DIAGNOSIS — Z992 Dependence on renal dialysis: Secondary | ICD-10-CM | POA: Diagnosis not present

## 2019-10-14 DIAGNOSIS — E1129 Type 2 diabetes mellitus with other diabetic kidney complication: Secondary | ICD-10-CM | POA: Diagnosis not present

## 2019-10-14 DIAGNOSIS — N186 End stage renal disease: Secondary | ICD-10-CM | POA: Diagnosis not present

## 2019-10-14 DIAGNOSIS — D631 Anemia in chronic kidney disease: Secondary | ICD-10-CM | POA: Diagnosis not present

## 2019-10-14 DIAGNOSIS — D509 Iron deficiency anemia, unspecified: Secondary | ICD-10-CM | POA: Diagnosis not present

## 2019-10-14 DIAGNOSIS — Z992 Dependence on renal dialysis: Secondary | ICD-10-CM | POA: Diagnosis not present

## 2019-10-14 DIAGNOSIS — N2581 Secondary hyperparathyroidism of renal origin: Secondary | ICD-10-CM | POA: Diagnosis not present

## 2019-10-16 DIAGNOSIS — N2581 Secondary hyperparathyroidism of renal origin: Secondary | ICD-10-CM | POA: Diagnosis not present

## 2019-10-16 DIAGNOSIS — E1129 Type 2 diabetes mellitus with other diabetic kidney complication: Secondary | ICD-10-CM | POA: Diagnosis not present

## 2019-10-16 DIAGNOSIS — D631 Anemia in chronic kidney disease: Secondary | ICD-10-CM | POA: Diagnosis not present

## 2019-10-16 DIAGNOSIS — Z992 Dependence on renal dialysis: Secondary | ICD-10-CM | POA: Diagnosis not present

## 2019-10-16 DIAGNOSIS — D509 Iron deficiency anemia, unspecified: Secondary | ICD-10-CM | POA: Diagnosis not present

## 2019-10-16 DIAGNOSIS — N186 End stage renal disease: Secondary | ICD-10-CM | POA: Diagnosis not present

## 2019-10-19 DIAGNOSIS — D509 Iron deficiency anemia, unspecified: Secondary | ICD-10-CM | POA: Diagnosis not present

## 2019-10-19 DIAGNOSIS — D631 Anemia in chronic kidney disease: Secondary | ICD-10-CM | POA: Diagnosis not present

## 2019-10-19 DIAGNOSIS — N186 End stage renal disease: Secondary | ICD-10-CM | POA: Diagnosis not present

## 2019-10-19 DIAGNOSIS — E1129 Type 2 diabetes mellitus with other diabetic kidney complication: Secondary | ICD-10-CM | POA: Diagnosis not present

## 2019-10-19 DIAGNOSIS — N2581 Secondary hyperparathyroidism of renal origin: Secondary | ICD-10-CM | POA: Diagnosis not present

## 2019-10-19 DIAGNOSIS — Z992 Dependence on renal dialysis: Secondary | ICD-10-CM | POA: Diagnosis not present

## 2019-10-21 DIAGNOSIS — N2581 Secondary hyperparathyroidism of renal origin: Secondary | ICD-10-CM | POA: Diagnosis not present

## 2019-10-21 DIAGNOSIS — E1129 Type 2 diabetes mellitus with other diabetic kidney complication: Secondary | ICD-10-CM | POA: Diagnosis not present

## 2019-10-21 DIAGNOSIS — N186 End stage renal disease: Secondary | ICD-10-CM | POA: Diagnosis not present

## 2019-10-21 DIAGNOSIS — Z992 Dependence on renal dialysis: Secondary | ICD-10-CM | POA: Diagnosis not present

## 2019-10-21 DIAGNOSIS — D509 Iron deficiency anemia, unspecified: Secondary | ICD-10-CM | POA: Diagnosis not present

## 2019-10-21 DIAGNOSIS — D631 Anemia in chronic kidney disease: Secondary | ICD-10-CM | POA: Diagnosis not present

## 2019-10-22 ENCOUNTER — Encounter: Payer: Medicare Other | Attending: Physical Medicine & Rehabilitation | Admitting: Physical Medicine & Rehabilitation

## 2019-10-22 ENCOUNTER — Encounter: Payer: Self-pay | Admitting: Physical Medicine & Rehabilitation

## 2019-10-22 ENCOUNTER — Other Ambulatory Visit: Payer: Self-pay

## 2019-10-22 VITALS — BP 99/67 | HR 86 | Ht 77.0 in | Wt 307.0 lb

## 2019-10-22 DIAGNOSIS — N186 End stage renal disease: Secondary | ICD-10-CM | POA: Diagnosis not present

## 2019-10-22 DIAGNOSIS — Z992 Dependence on renal dialysis: Secondary | ICD-10-CM | POA: Diagnosis not present

## 2019-10-22 DIAGNOSIS — G479 Sleep disorder, unspecified: Secondary | ICD-10-CM | POA: Diagnosis not present

## 2019-10-22 DIAGNOSIS — G6289 Other specified polyneuropathies: Secondary | ICD-10-CM | POA: Diagnosis not present

## 2019-10-22 DIAGNOSIS — R269 Unspecified abnormalities of gait and mobility: Secondary | ICD-10-CM

## 2019-10-22 DIAGNOSIS — E0842 Diabetes mellitus due to underlying condition with diabetic polyneuropathy: Secondary | ICD-10-CM

## 2019-10-22 MED ORDER — PREGABALIN 25 MG PO CAPS: ORAL_CAPSULE | ORAL | 1 refills | Status: DC

## 2019-10-22 MED ORDER — PREGABALIN 75 MG PO CAPS: 75.0000 mg | ORAL_CAPSULE | Freq: Every day | ORAL | 1 refills | Status: DC

## 2019-10-22 NOTE — Progress Notes (Signed)
Subjective:    Patient ID: [name], male    DOB: 06/12/1961, 59 y.o.   MRN: [id number]  HPI Male with pmh of heart transplant, ESRD, prostate CA, OSA, obesity, CHF, GERD, DM, CVA with residual left hand weakness, presents with peripheral neuropathy.  Initially stated: B/l knees distally and b/l hands. Started after transplant and dialysis in ~2015.  Getting progressively worse.  Worse prior to bed.  HD exacerbates the pain.  Off HD days better. Pins/needles/tingling.  Associated numbness. Lyrica helps slightly.  Denies falls. Pain limits ambulation.   Last clinic visit on 09/24/19.  Since that time, pt states he continues to heat, lidocaine patch, salt baths, Cymbalta, Lyrica. He states he lost another pound. Denies falls. He is ambulating more.  Sleep is tolerable.  Pain Inventory Average Pain 7 Pain Right Now 7 My pain is sharp, burning, dull, stabbing, tingling and aching  In the last 24 hours, has pain interfered with the following? General activity 4 Relation with others 4 Enjoyment of life 4 What TIME of day is your pain at its worst? daytime Sleep (in general) Poor  Pain is worse with: walking, bending, sitting, inactivity, standing, unsure and some activites Pain improves with: medication Relief from Meds: 5  Mobility walk without assistance walk with assistance use a cane use a walker ability to climb steps?  yes do you drive?  yes Do you have any goals in this area?  no  Function retired Do you have any goals in this area?  yes  Neuro/Psych numbness tremor tingling  Prior Studies Any changes since last visit?  no  Physicians involved in your care Any changes since last visit?  no   Family History  Problem Relation Age of Onset  . Coronary artery disease Father        had, PTCA & CABG  . Heart attack Father   . Hypertension Father   . Diabetes Father   . Prostate cancer Father   . Coronary artery disease Mother   . Heart attack Mother   .  Hypertension Mother   . Stroke Mother   . Lupus Mother   . Prostate cancer Brother   . Coronary artery disease Brother   . Coronary artery disease Sister   . Heart attack Sister   . Prostate cancer Paternal Uncle   . Coronary artery disease Maternal Grandmother   . Coronary artery disease Maternal Grandfather   . Coronary artery disease Paternal Grandmother   . Coronary artery disease Paternal Grandfather    Social History   Socioeconomic History  . Marital status: Married    Spouse name: Nicki Reaper  . Number of children: 1  . Years of education: 90  . Highest education level: Not on file  Occupational History  . Occupation: retired    Fish farm manager: [employer]  Tobacco Use  . Smoking status: Never Smoker  . Smokeless tobacco: Former Systems developer    Types: Snuff  Substance and Sexual Activity  . Alcohol use: No  . Drug use: No  . Sexual activity: Not Currently  Other Topics Concern  . Not on file  Social History Narrative   Lives with wife in a one story home.  Has one child.     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    Education: college.    Social Determinants of Health   Financial Resource Strain:   . Difficulty of Paying Living Expenses: Not on file  Food Insecurity:   .  Worried About Charity fundraiser in the Last Year: Not on file  . Ran Out of Food in the Last Year: Not on file  Transportation Needs:   . Lack of Transportation (Medical): Not on file  . Lack of Transportation (Non-Medical): Not on file  Physical Activity:   . Days of Exercise per Week: Not on file  . Minutes of Exercise per Session: Not on file  Stress:   . Feeling of Stress : Not on file  Social Connections:   . Frequency of Communication with Friends and Family: Not on file  . Frequency of Social Gatherings with Friends and Family: Not on file  . Attends Religious Services: Not on file  . Active Member of Clubs or Organizations: Not on file  . Attends English as a second language teacher Meetings: Not on file  . Marital Status: Not on file   Past Surgical History:  Procedure Laterality Date  . CARDIAC PACEMAKER PLACEMENT  09/21/2009   Biventricular implantable cardioverter-defibrillator implantation     . COLONOSCOPY W/ BIOPSIES AND POLYPECTOMY    . FOOT SURGERY     left  . HEART TRANSPLANT  2014  . LAPAROSCOPIC GASTRIC BANDING  01/27/2007  . LEFT VENTRICULAR ASSIST DEVICE     implant[facility] at [facility]  . PROSTATE BIOPSY    . TOTAL HIP ARTHROPLASTY Right 07/22/2017   Procedure: RIGHT TOTAL HIP ARTHROPLASTY ANTERIOR APPROACH;  Surgeon: Rod Can, MD;  Location: [place];  Service: Orthopedics;  Laterality: Right;  Needs RNFA  . TOTAL KNEE ARTHROPLASTY Right 07/22/2017   Past Medical History:  Diagnosis Date  . Arthritis   . Atrial fibrillation ([place])   . CHF (congestive heart failure), NYHA class III (HCC)    s/p heart transplant  . Chronic kidney disease    end stage  . Chronic systolic dysfunction of left ventricle   . CVA (cerebral infarction)   . Diabetes mellitus    PMH; Prior to heart transplant  . Family history of coronary artery disease    in both parents  . GERD (gastroesophageal reflux disease)   . Hyperlipidemia   . Hypertension   . Left bundle branch block   . Morbid obesity ([place])    status post lap band  . Myocardial infarction [facility])    prior to heart transplant  . Nonischemic cardiomyopathy ([place])    prior to heart transplant  . Obesity (BMI 30-39.9)   . Obstructive sleep apnea    no longer needs CPAP after heart transplant per pt  . Premature ventricular contractions   . Prostate cancer ([place])   . SOB (shortness of breath)    BP 99/67   Pulse 86   Ht 6\' 5"  (1.956 m)   Wt (!) 307 lb (139.3 kg)   SpO2 97%   BMI 36.40 kg/m   Opioid Risk Score:   Fall Risk Score:  `1  Depression screen PHQ 2/9  No flowsheet data found.   Review of Systems  Constitutional: Negative.   HENT: Negative.   Eyes: Negative.    Respiratory: Negative.   Cardiovascular: Negative.   Gastrointestinal: Negative.   Endocrine: Negative.   Genitourinary: Negative.   Musculoskeletal: Positive for arthralgias, gait problem and myalgias.  Skin: Negative.   Allergic/Immunologic: Negative.   Neurological: Positive for tremors and numbness.       Tingling  Hematological: Negative.       Objective:   Physical Exam  Constitutional: No distress .  Respiratory: Normal effort.   Psych:  Normal mood.  Normal behavior. Musc: No edema in extremities.  No tenderness in extremities. Gait: Slightly antalgic Neuro: Alert  Tremors Mild atrophy in hands Motor: B/l LE 4/5 Sensation diminished to light touch distal to b/l knees, stable    Assessment & Plan:  Male with pmh of heart transplant, ESRD, prostate CA, OSA, obesity, CHF, GERD, DM, CVA with residual left hand weakness, presents with peripheral neuropathy.   1. Polyneuropathy - multifactorial related to DM, HD  Cont Heat  Cont Lidocaine ointment/patch  Cont Epson salt baths  Will refer for ESTIM  Cont Lyrica to 75 daily with increase in additional to 125mg  post HD  Cont Cymbalta  Will consider Robaxin   Will consider Referral to Psychology  Patient states main goal is to ambulate more  Will consider Capsicin, baths  2. Gait abnormality  No longer requires cane  3. Sleep disturbance  See #1  Will consider Elavil, will hold off on present  No longer requires CPAP  4. Morbid Obesity  Cont follow up with dietitian at HD  Slowly losing weight, cont to encourage  Contributing to pain

## 2019-10-23 DIAGNOSIS — D509 Iron deficiency anemia, unspecified: Secondary | ICD-10-CM | POA: Diagnosis not present

## 2019-10-23 DIAGNOSIS — Z992 Dependence on renal dialysis: Secondary | ICD-10-CM | POA: Diagnosis not present

## 2019-10-23 DIAGNOSIS — N2581 Secondary hyperparathyroidism of renal origin: Secondary | ICD-10-CM | POA: Diagnosis not present

## 2019-10-23 DIAGNOSIS — E1129 Type 2 diabetes mellitus with other diabetic kidney complication: Secondary | ICD-10-CM | POA: Diagnosis not present

## 2019-10-23 DIAGNOSIS — D631 Anemia in chronic kidney disease: Secondary | ICD-10-CM | POA: Diagnosis not present

## 2019-10-23 DIAGNOSIS — N186 End stage renal disease: Secondary | ICD-10-CM | POA: Diagnosis not present

## 2019-10-26 DIAGNOSIS — Z992 Dependence on renal dialysis: Secondary | ICD-10-CM | POA: Diagnosis not present

## 2019-10-26 DIAGNOSIS — D509 Iron deficiency anemia, unspecified: Secondary | ICD-10-CM | POA: Diagnosis not present

## 2019-10-26 DIAGNOSIS — C61 Malignant neoplasm of prostate: Secondary | ICD-10-CM | POA: Diagnosis not present

## 2019-10-26 DIAGNOSIS — Z01818 Encounter for other preprocedural examination: Secondary | ICD-10-CM | POA: Diagnosis not present

## 2019-10-26 DIAGNOSIS — I132 Hypertensive heart and chronic kidney disease with heart failure and with stage 5 chronic kidney disease, or end stage renal disease: Secondary | ICD-10-CM | POA: Diagnosis not present

## 2019-10-26 DIAGNOSIS — N186 End stage renal disease: Secondary | ICD-10-CM | POA: Diagnosis not present

## 2019-10-26 DIAGNOSIS — N529 Male erectile dysfunction, unspecified: Secondary | ICD-10-CM | POA: Diagnosis not present

## 2019-10-26 DIAGNOSIS — D631 Anemia in chronic kidney disease: Secondary | ICD-10-CM | POA: Diagnosis not present

## 2019-10-26 DIAGNOSIS — E1129 Type 2 diabetes mellitus with other diabetic kidney complication: Secondary | ICD-10-CM | POA: Diagnosis not present

## 2019-10-26 DIAGNOSIS — I5089 Other heart failure: Secondary | ICD-10-CM | POA: Diagnosis not present

## 2019-10-26 DIAGNOSIS — Z01810 Encounter for preprocedural cardiovascular examination: Secondary | ICD-10-CM | POA: Diagnosis not present

## 2019-10-26 DIAGNOSIS — T8620 Unspecified complication of heart transplant: Secondary | ICD-10-CM | POA: Diagnosis not present

## 2019-10-26 DIAGNOSIS — N2581 Secondary hyperparathyroidism of renal origin: Secondary | ICD-10-CM | POA: Diagnosis not present

## 2019-10-26 DIAGNOSIS — Z23 Encounter for immunization: Secondary | ICD-10-CM | POA: Diagnosis not present

## 2019-10-27 DIAGNOSIS — Z7682 Awaiting organ transplant status: Secondary | ICD-10-CM | POA: Diagnosis not present

## 2019-10-27 DIAGNOSIS — I132 Hypertensive heart and chronic kidney disease with heart failure and with stage 5 chronic kidney disease, or end stage renal disease: Secondary | ICD-10-CM | POA: Diagnosis not present

## 2019-10-27 DIAGNOSIS — Z992 Dependence on renal dialysis: Secondary | ICD-10-CM | POA: Diagnosis not present

## 2019-10-27 DIAGNOSIS — C61 Malignant neoplasm of prostate: Secondary | ICD-10-CM | POA: Diagnosis not present

## 2019-10-27 DIAGNOSIS — Z923 Personal history of irradiation: Secondary | ICD-10-CM | POA: Diagnosis not present

## 2019-10-27 DIAGNOSIS — Z79899 Other long term (current) drug therapy: Secondary | ICD-10-CM | POA: Diagnosis not present

## 2019-10-27 DIAGNOSIS — N186 End stage renal disease: Secondary | ICD-10-CM | POA: Diagnosis not present

## 2019-10-27 DIAGNOSIS — D631 Anemia in chronic kidney disease: Secondary | ICD-10-CM | POA: Diagnosis not present

## 2019-10-27 DIAGNOSIS — Z01810 Encounter for preprocedural cardiovascular examination: Secondary | ICD-10-CM | POA: Diagnosis not present

## 2019-10-27 DIAGNOSIS — I5089 Other heart failure: Secondary | ICD-10-CM | POA: Diagnosis not present

## 2019-10-27 DIAGNOSIS — Z01818 Encounter for other preprocedural examination: Secondary | ICD-10-CM | POA: Diagnosis not present

## 2019-10-27 DIAGNOSIS — N529 Male erectile dysfunction, unspecified: Secondary | ICD-10-CM | POA: Diagnosis not present

## 2019-10-28 DIAGNOSIS — Z992 Dependence on renal dialysis: Secondary | ICD-10-CM | POA: Diagnosis not present

## 2019-10-28 DIAGNOSIS — N2581 Secondary hyperparathyroidism of renal origin: Secondary | ICD-10-CM | POA: Diagnosis not present

## 2019-10-28 DIAGNOSIS — E1129 Type 2 diabetes mellitus with other diabetic kidney complication: Secondary | ICD-10-CM | POA: Diagnosis not present

## 2019-10-28 DIAGNOSIS — D631 Anemia in chronic kidney disease: Secondary | ICD-10-CM | POA: Diagnosis not present

## 2019-10-28 DIAGNOSIS — N186 End stage renal disease: Secondary | ICD-10-CM | POA: Diagnosis not present

## 2019-10-28 DIAGNOSIS — D509 Iron deficiency anemia, unspecified: Secondary | ICD-10-CM | POA: Diagnosis not present

## 2019-10-30 DIAGNOSIS — D509 Iron deficiency anemia, unspecified: Secondary | ICD-10-CM | POA: Diagnosis not present

## 2019-10-30 DIAGNOSIS — E1129 Type 2 diabetes mellitus with other diabetic kidney complication: Secondary | ICD-10-CM | POA: Diagnosis not present

## 2019-10-30 DIAGNOSIS — N186 End stage renal disease: Secondary | ICD-10-CM | POA: Diagnosis not present

## 2019-10-30 DIAGNOSIS — Z992 Dependence on renal dialysis: Secondary | ICD-10-CM | POA: Diagnosis not present

## 2019-10-30 DIAGNOSIS — N2581 Secondary hyperparathyroidism of renal origin: Secondary | ICD-10-CM | POA: Diagnosis not present

## 2019-10-30 DIAGNOSIS — D631 Anemia in chronic kidney disease: Secondary | ICD-10-CM | POA: Diagnosis not present

## 2019-11-02 DIAGNOSIS — N186 End stage renal disease: Secondary | ICD-10-CM | POA: Diagnosis not present

## 2019-11-02 DIAGNOSIS — D509 Iron deficiency anemia, unspecified: Secondary | ICD-10-CM | POA: Diagnosis not present

## 2019-11-02 DIAGNOSIS — D631 Anemia in chronic kidney disease: Secondary | ICD-10-CM | POA: Diagnosis not present

## 2019-11-02 DIAGNOSIS — E1129 Type 2 diabetes mellitus with other diabetic kidney complication: Secondary | ICD-10-CM | POA: Diagnosis not present

## 2019-11-02 DIAGNOSIS — N2581 Secondary hyperparathyroidism of renal origin: Secondary | ICD-10-CM | POA: Diagnosis not present

## 2019-11-02 DIAGNOSIS — Z992 Dependence on renal dialysis: Secondary | ICD-10-CM | POA: Diagnosis not present

## 2019-11-04 DIAGNOSIS — D509 Iron deficiency anemia, unspecified: Secondary | ICD-10-CM | POA: Diagnosis not present

## 2019-11-04 DIAGNOSIS — E1129 Type 2 diabetes mellitus with other diabetic kidney complication: Secondary | ICD-10-CM | POA: Diagnosis not present

## 2019-11-04 DIAGNOSIS — Z992 Dependence on renal dialysis: Secondary | ICD-10-CM | POA: Diagnosis not present

## 2019-11-04 DIAGNOSIS — N186 End stage renal disease: Secondary | ICD-10-CM | POA: Diagnosis not present

## 2019-11-04 DIAGNOSIS — N2581 Secondary hyperparathyroidism of renal origin: Secondary | ICD-10-CM | POA: Diagnosis not present

## 2019-11-04 DIAGNOSIS — D631 Anemia in chronic kidney disease: Secondary | ICD-10-CM | POA: Diagnosis not present

## 2019-11-06 DIAGNOSIS — E1129 Type 2 diabetes mellitus with other diabetic kidney complication: Secondary | ICD-10-CM | POA: Diagnosis not present

## 2019-11-06 DIAGNOSIS — N2581 Secondary hyperparathyroidism of renal origin: Secondary | ICD-10-CM | POA: Diagnosis not present

## 2019-11-06 DIAGNOSIS — D509 Iron deficiency anemia, unspecified: Secondary | ICD-10-CM | POA: Diagnosis not present

## 2019-11-06 DIAGNOSIS — N186 End stage renal disease: Secondary | ICD-10-CM | POA: Diagnosis not present

## 2019-11-06 DIAGNOSIS — Z992 Dependence on renal dialysis: Secondary | ICD-10-CM | POA: Diagnosis not present

## 2019-11-06 DIAGNOSIS — D631 Anemia in chronic kidney disease: Secondary | ICD-10-CM | POA: Diagnosis not present

## 2019-11-09 DIAGNOSIS — D631 Anemia in chronic kidney disease: Secondary | ICD-10-CM | POA: Diagnosis not present

## 2019-11-09 DIAGNOSIS — D509 Iron deficiency anemia, unspecified: Secondary | ICD-10-CM | POA: Diagnosis not present

## 2019-11-09 DIAGNOSIS — N186 End stage renal disease: Secondary | ICD-10-CM | POA: Diagnosis not present

## 2019-11-09 DIAGNOSIS — Z992 Dependence on renal dialysis: Secondary | ICD-10-CM | POA: Diagnosis not present

## 2019-11-09 DIAGNOSIS — E1129 Type 2 diabetes mellitus with other diabetic kidney complication: Secondary | ICD-10-CM | POA: Diagnosis not present

## 2019-11-09 DIAGNOSIS — N2581 Secondary hyperparathyroidism of renal origin: Secondary | ICD-10-CM | POA: Diagnosis not present

## 2019-11-11 DIAGNOSIS — N186 End stage renal disease: Secondary | ICD-10-CM | POA: Diagnosis not present

## 2019-11-11 DIAGNOSIS — Z992 Dependence on renal dialysis: Secondary | ICD-10-CM | POA: Diagnosis not present

## 2019-11-11 DIAGNOSIS — D509 Iron deficiency anemia, unspecified: Secondary | ICD-10-CM | POA: Diagnosis not present

## 2019-11-11 DIAGNOSIS — E1129 Type 2 diabetes mellitus with other diabetic kidney complication: Secondary | ICD-10-CM | POA: Diagnosis not present

## 2019-11-11 DIAGNOSIS — N2581 Secondary hyperparathyroidism of renal origin: Secondary | ICD-10-CM | POA: Diagnosis not present

## 2019-11-11 DIAGNOSIS — D631 Anemia in chronic kidney disease: Secondary | ICD-10-CM | POA: Diagnosis not present

## 2019-11-13 DIAGNOSIS — Z992 Dependence on renal dialysis: Secondary | ICD-10-CM | POA: Diagnosis not present

## 2019-11-13 DIAGNOSIS — N186 End stage renal disease: Secondary | ICD-10-CM | POA: Diagnosis not present

## 2019-11-13 DIAGNOSIS — D509 Iron deficiency anemia, unspecified: Secondary | ICD-10-CM | POA: Diagnosis not present

## 2019-11-13 DIAGNOSIS — E1129 Type 2 diabetes mellitus with other diabetic kidney complication: Secondary | ICD-10-CM | POA: Diagnosis not present

## 2019-11-13 DIAGNOSIS — D631 Anemia in chronic kidney disease: Secondary | ICD-10-CM | POA: Diagnosis not present

## 2019-11-13 DIAGNOSIS — N2581 Secondary hyperparathyroidism of renal origin: Secondary | ICD-10-CM | POA: Diagnosis not present

## 2019-11-16 DIAGNOSIS — D631 Anemia in chronic kidney disease: Secondary | ICD-10-CM | POA: Diagnosis not present

## 2019-11-16 DIAGNOSIS — N2581 Secondary hyperparathyroidism of renal origin: Secondary | ICD-10-CM | POA: Diagnosis not present

## 2019-11-16 DIAGNOSIS — Z992 Dependence on renal dialysis: Secondary | ICD-10-CM | POA: Diagnosis not present

## 2019-11-16 DIAGNOSIS — E1129 Type 2 diabetes mellitus with other diabetic kidney complication: Secondary | ICD-10-CM | POA: Diagnosis not present

## 2019-11-16 DIAGNOSIS — N186 End stage renal disease: Secondary | ICD-10-CM | POA: Diagnosis not present

## 2019-11-16 DIAGNOSIS — D509 Iron deficiency anemia, unspecified: Secondary | ICD-10-CM | POA: Diagnosis not present

## 2019-11-18 DIAGNOSIS — D509 Iron deficiency anemia, unspecified: Secondary | ICD-10-CM | POA: Diagnosis not present

## 2019-11-18 DIAGNOSIS — D631 Anemia in chronic kidney disease: Secondary | ICD-10-CM | POA: Diagnosis not present

## 2019-11-18 DIAGNOSIS — N186 End stage renal disease: Secondary | ICD-10-CM | POA: Diagnosis not present

## 2019-11-18 DIAGNOSIS — N2581 Secondary hyperparathyroidism of renal origin: Secondary | ICD-10-CM | POA: Diagnosis not present

## 2019-11-18 DIAGNOSIS — Z992 Dependence on renal dialysis: Secondary | ICD-10-CM | POA: Diagnosis not present

## 2019-11-18 DIAGNOSIS — E1129 Type 2 diabetes mellitus with other diabetic kidney complication: Secondary | ICD-10-CM | POA: Diagnosis not present

## 2019-11-19 ENCOUNTER — Other Ambulatory Visit: Payer: Self-pay

## 2019-11-19 ENCOUNTER — Encounter: Payer: Self-pay | Admitting: Physical Medicine & Rehabilitation

## 2019-11-19 ENCOUNTER — Encounter: Payer: Medicare Other | Attending: Physical Medicine & Rehabilitation | Admitting: Physical Medicine & Rehabilitation

## 2019-11-19 VITALS — BP 92/61 | HR 95 | Temp 97.7°F | Ht 77.0 in | Wt 307.0 lb

## 2019-11-19 DIAGNOSIS — G6289 Other specified polyneuropathies: Secondary | ICD-10-CM | POA: Insufficient documentation

## 2019-11-19 DIAGNOSIS — R269 Unspecified abnormalities of gait and mobility: Secondary | ICD-10-CM | POA: Insufficient documentation

## 2019-11-19 DIAGNOSIS — E0842 Diabetes mellitus due to underlying condition with diabetic polyneuropathy: Secondary | ICD-10-CM | POA: Insufficient documentation

## 2019-11-19 DIAGNOSIS — Z992 Dependence on renal dialysis: Secondary | ICD-10-CM | POA: Diagnosis not present

## 2019-11-19 DIAGNOSIS — G479 Sleep disorder, unspecified: Secondary | ICD-10-CM | POA: Insufficient documentation

## 2019-11-19 DIAGNOSIS — N186 End stage renal disease: Secondary | ICD-10-CM | POA: Insufficient documentation

## 2019-11-19 MED ORDER — PREGABALIN 25 MG PO CAPS: ORAL_CAPSULE | ORAL | 1 refills | Status: DC

## 2019-11-19 MED ORDER — PREGABALIN 75 MG PO CAPS: 75.0000 mg | ORAL_CAPSULE | Freq: Every day | ORAL | 1 refills | Status: DC

## 2019-11-19 NOTE — Addendum Note (Signed)
Addended by: Delice Lesch A on: 11/19/2019 09:53 AM   Modules accepted: Orders

## 2019-11-19 NOTE — Progress Notes (Signed)
Subjective:    Patient ID: [name], male    DOB: 19-Feb-1961, 59 y.o.   MRN: [id number]  HPI Male with pmh of heart transplant, ESRD, prostate CA, OSA, obesity, CHF, GERD, DM, CVA with residual left hand weakness, presents with peripheral neuropathy.  Initially stated: B/l knees distally and b/l hands. Started after transplant and dialysis in ~2015.  Getting progressively worse.  Worse prior to bed.  HD exacerbates the pain.  Off HD days better. Pins/needles/tingling.  Associated numbness. Lyrica helps slightly.  Denies falls. Pain limits ambulation.   Last clinic visit on 10/21/2018. Since that time, pt states he did not receive call regarding ESTIM.  He continues to use Lidoderm patch/ointment, less frequently. He is having benefit with Lyrica and Cymbalta. Denies.  Sleep is good. He has not lost weight. He states pain is improved, but continues to have pain and wants to be as aggressive as possible.   Pain Inventory Average Pain 6 Pain Right Now 7 My pain is sharp, burning, dull, stabbing, tingling and aching  In the last 24 hours, has pain interfered with the following? General activity 5 Relation with others 4 Enjoyment of life 6 What TIME of day is your pain at its worst? daytime Sleep (in general) Poor  Pain is worse with: walking, bending, sitting, inactivity, standing, unsure and some activites Pain improves with: medication Relief from Meds: 5  Mobility walk without assistance walk with assistance use a cane use a walker ability to climb steps?  yes do you drive?  yes Do you have any goals in this area?  no  Function retired Do you have any goals in this area?  yes  Neuro/Psych numbness tremor tingling trouble walking spasms dizziness confusion depression anxiety  Prior Studies Any changes since last visit?  no  Physicians involved in your care Any changes since last visit?  no   Family History  Problem Relation Age of Onset  . Coronary  artery disease Father        had, PTCA & CABG  . Heart attack Father   . Hypertension Father   . Diabetes Father   . Prostate cancer Father   . Coronary artery disease Mother   . Heart attack Mother   . Hypertension Mother   . Stroke Mother   . Lupus Mother   . Prostate cancer Brother   . Coronary artery disease Brother   . Coronary artery disease Sister   . Heart attack Sister   . Prostate cancer Paternal Uncle   . Coronary artery disease Maternal Grandmother   . Coronary artery disease Maternal Grandfather   . Coronary artery disease Paternal Grandmother   . Coronary artery disease Paternal Grandfather    Social History   Socioeconomic History  . Marital status: Married    Spouse name: Nicki Reaper  . Number of children: 1  . Years of education: 65  . Highest education level: Not on file  Occupational History  . Occupation: retired    Fish farm manager: [employer]  Tobacco Use  . Smoking status: Never Smoker  . Smokeless tobacco: Former Systems developer    Types: Snuff  Substance and Sexual Activity  . Alcohol use: No  . Drug use: No  . Sexual activity: Not Currently  Other Topics Concern  . Not on file  Social History Narrative   Lives with wife in a one story home.  Has one child.     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    Education: college.    Social Determinants of Health   Financial Resource Strain:   . Difficulty of Paying Living Expenses:   Food Insecurity:   . Worried About Charity fundraiser in the Last Year:   . Arboriculturist in the Last Year:   Transportation Needs:   . Film/video editor (Medical):   Marland Kitchen Lack of Transportation (Non-Medical):   Physical Activity:   . Days of Exercise per Week:   . Minutes of Exercise per Session:   Stress:   . Feeling of Stress :   Social Connections:   . Frequency of Communication with Friends and Family:   . Frequency of Social Gatherings with Friends and Family:   . Attends  Religious Services:   . Active Member of Clubs or Organizations:   . Attends Archivist Meetings:   Marland Kitchen Marital Status:    Past Surgical History:  Procedure Laterality Date  . CARDIAC PACEMAKER PLACEMENT  09/21/2009   Biventricular implantable cardioverter-defibrillator implantation     . COLONOSCOPY W/ BIOPSIES AND POLYPECTOMY    . FOOT SURGERY     left  . HEART TRANSPLANT  2014  . LAPAROSCOPIC GASTRIC BANDING  01/27/2007  . LEFT VENTRICULAR ASSIST DEVICE     implant[facility] at [facility]  . PROSTATE BIOPSY    . TOTAL HIP ARTHROPLASTY Right 07/22/2017   Procedure: RIGHT TOTAL HIP ARTHROPLASTY ANTERIOR APPROACH;  Surgeon: Rod Can, MD;  Location: [place];  Service: Orthopedics;  Laterality: Right;  Needs RNFA  . TOTAL KNEE ARTHROPLASTY Right 07/22/2017   Past Medical History:  Diagnosis Date  . Arthritis   . Atrial fibrillation ([place])   . CHF (congestive heart failure), NYHA class III (HCC)    s/p heart transplant  . Chronic kidney disease    end stage  . Chronic systolic dysfunction of left ventricle   . CVA (cerebral infarction)   . Diabetes mellitus    PMH; Prior to heart transplant  . Family history of coronary artery disease    in both parents  . GERD (gastroesophageal reflux disease)   . Hyperlipidemia   . Hypertension   . Left bundle branch block   . Morbid obesity ([place])    status post lap band  . Myocardial infarction [facility])    prior to heart transplant  . Nonischemic cardiomyopathy ([place])    prior to heart transplant  . Obesity (BMI 30-39.9)   . Obstructive sleep apnea    no longer needs CPAP after heart transplant per pt  . Premature ventricular contractions   . Prostate cancer ([place])   . SOB (shortness of breath)    BP 92/61   Pulse 95   Temp 97.7 F (36.5 C)   Ht 6\' 5"  (1.956 m)   Wt (!) 307 lb (139.3 kg)   SpO2 97%   BMI 36.40 kg/m   Opioid Risk Score:   Fall Risk Score:  `1  Depression screen PHQ 2/9  No flowsheet data found.   Review  of Systems  Constitutional: Negative.   HENT: Negative.   Eyes: Negative.   Respiratory: Negative.   Cardiovascular: Negative.   Gastrointestinal: Negative.   Endocrine: Negative.   Genitourinary: Negative.   Musculoskeletal: Positive for arthralgias, gait problem and myalgias.  Skin: Negative.   Allergic/Immunologic: Negative.   Neurological: Positive for dizziness, tremors, weakness and numbness.       Tingling  Hematological: Negative.   Psychiatric/Behavioral: Positive for confusion  and dysphoric mood. The patient is nervous/anxious.       Objective:   Physical Exam  Constitutional: NAD. Respiratory: Normal effort.   Psych: Normal mood.  Normal behavior. Musc: No edema in extremities.  No tenderness in extremities. Gait: Mildly antalgic Neuro: Alert Tremors, stable Mild atrophy in hands Motor: B/l LE 4/5 Sensation diminished to light touch distal to b/l knees, unchanged    Assessment & Plan:  Male with pmh of heart transplant, ESRD, prostate CA, OSA, obesity, CHF, GERD, DM, CVA with residual left hand weakness, presents with peripheral neuropathy.   1. Polyneuropathy - multifactorial related to DM, HD  In distal extremities, worse with ambulation   Cont Heat  Cont Lidocaine ointment/patch  Cont Epson salt baths  Referred for ESTIM, pt states he never received call back  Cont Lyrica to 75 daily with increase in additional to 125mg  post HD  Continue Cymbalta  Will consider Robaxin   Will consider Referral to Psychology  Patient states main goal is to ambulate more, which he is doing  Will consider Capsicin, baths  Will schedule for LLE PNS  2. Gait abnormality  No longer requires cane  3. Sleep disturbance  See #1  Will consider Elavil, will hold off on present  No longer requires CPAP  Improving   4. Morbid Obesity  Cont follow up with dietitian at HD  Slowly losing weight, continue to encourage  Contributing to pain

## 2019-11-20 DIAGNOSIS — N186 End stage renal disease: Secondary | ICD-10-CM | POA: Diagnosis not present

## 2019-11-20 DIAGNOSIS — Z992 Dependence on renal dialysis: Secondary | ICD-10-CM | POA: Diagnosis not present

## 2019-11-20 DIAGNOSIS — D631 Anemia in chronic kidney disease: Secondary | ICD-10-CM | POA: Diagnosis not present

## 2019-11-20 DIAGNOSIS — D509 Iron deficiency anemia, unspecified: Secondary | ICD-10-CM | POA: Diagnosis not present

## 2019-11-20 DIAGNOSIS — N2581 Secondary hyperparathyroidism of renal origin: Secondary | ICD-10-CM | POA: Diagnosis not present

## 2019-11-20 DIAGNOSIS — E1129 Type 2 diabetes mellitus with other diabetic kidney complication: Secondary | ICD-10-CM | POA: Diagnosis not present

## 2019-11-23 DIAGNOSIS — N186 End stage renal disease: Secondary | ICD-10-CM | POA: Diagnosis not present

## 2019-11-23 DIAGNOSIS — Z992 Dependence on renal dialysis: Secondary | ICD-10-CM | POA: Diagnosis not present

## 2019-11-23 DIAGNOSIS — D631 Anemia in chronic kidney disease: Secondary | ICD-10-CM | POA: Diagnosis not present

## 2019-11-23 DIAGNOSIS — D509 Iron deficiency anemia, unspecified: Secondary | ICD-10-CM | POA: Diagnosis not present

## 2019-11-23 DIAGNOSIS — E1129 Type 2 diabetes mellitus with other diabetic kidney complication: Secondary | ICD-10-CM | POA: Diagnosis not present

## 2019-11-23 DIAGNOSIS — N2581 Secondary hyperparathyroidism of renal origin: Secondary | ICD-10-CM | POA: Diagnosis not present

## 2019-11-25 DIAGNOSIS — D509 Iron deficiency anemia, unspecified: Secondary | ICD-10-CM | POA: Diagnosis not present

## 2019-11-25 DIAGNOSIS — E1129 Type 2 diabetes mellitus with other diabetic kidney complication: Secondary | ICD-10-CM | POA: Diagnosis not present

## 2019-11-25 DIAGNOSIS — Z992 Dependence on renal dialysis: Secondary | ICD-10-CM | POA: Diagnosis not present

## 2019-11-25 DIAGNOSIS — N186 End stage renal disease: Secondary | ICD-10-CM | POA: Diagnosis not present

## 2019-11-25 DIAGNOSIS — N2581 Secondary hyperparathyroidism of renal origin: Secondary | ICD-10-CM | POA: Diagnosis not present

## 2019-11-25 DIAGNOSIS — D631 Anemia in chronic kidney disease: Secondary | ICD-10-CM | POA: Diagnosis not present

## 2019-11-26 DIAGNOSIS — T8620 Unspecified complication of heart transplant: Secondary | ICD-10-CM | POA: Diagnosis not present

## 2019-11-26 DIAGNOSIS — G629 Polyneuropathy, unspecified: Secondary | ICD-10-CM | POA: Diagnosis not present

## 2019-11-26 DIAGNOSIS — N186 End stage renal disease: Secondary | ICD-10-CM | POA: Diagnosis not present

## 2019-11-26 DIAGNOSIS — I517 Cardiomegaly: Secondary | ICD-10-CM | POA: Diagnosis not present

## 2019-11-26 DIAGNOSIS — E1129 Type 2 diabetes mellitus with other diabetic kidney complication: Secondary | ICD-10-CM | POA: Diagnosis not present

## 2019-11-26 DIAGNOSIS — N2581 Secondary hyperparathyroidism of renal origin: Secondary | ICD-10-CM | POA: Diagnosis not present

## 2019-11-26 DIAGNOSIS — Z941 Heart transplant status: Secondary | ICD-10-CM | POA: Diagnosis not present

## 2019-11-26 DIAGNOSIS — Z79899 Other long term (current) drug therapy: Secondary | ICD-10-CM | POA: Diagnosis not present

## 2019-11-26 DIAGNOSIS — C61 Malignant neoplasm of prostate: Secondary | ICD-10-CM | POA: Diagnosis not present

## 2019-11-26 DIAGNOSIS — Z4821 Encounter for aftercare following heart transplant: Secondary | ICD-10-CM | POA: Diagnosis not present

## 2019-11-26 DIAGNOSIS — D849 Immunodeficiency, unspecified: Secondary | ICD-10-CM | POA: Diagnosis not present

## 2019-11-26 DIAGNOSIS — I371 Nonrheumatic pulmonary valve insufficiency: Secondary | ICD-10-CM | POA: Diagnosis not present

## 2019-11-26 DIAGNOSIS — Z923 Personal history of irradiation: Secondary | ICD-10-CM | POA: Diagnosis not present

## 2019-11-26 DIAGNOSIS — D631 Anemia in chronic kidney disease: Secondary | ICD-10-CM | POA: Diagnosis not present

## 2019-11-26 DIAGNOSIS — Z992 Dependence on renal dialysis: Secondary | ICD-10-CM | POA: Diagnosis not present

## 2019-11-26 DIAGNOSIS — Z298 Encounter for other specified prophylactic measures: Secondary | ICD-10-CM | POA: Diagnosis not present

## 2019-11-26 DIAGNOSIS — R0600 Dyspnea, unspecified: Secondary | ICD-10-CM | POA: Diagnosis not present

## 2019-11-26 DIAGNOSIS — Z7982 Long term (current) use of aspirin: Secondary | ICD-10-CM | POA: Diagnosis not present

## 2019-11-26 DIAGNOSIS — Z48298 Encounter for aftercare following other organ transplant: Secondary | ICD-10-CM | POA: Diagnosis not present

## 2019-11-27 DIAGNOSIS — E1129 Type 2 diabetes mellitus with other diabetic kidney complication: Secondary | ICD-10-CM | POA: Diagnosis not present

## 2019-11-27 DIAGNOSIS — N2581 Secondary hyperparathyroidism of renal origin: Secondary | ICD-10-CM | POA: Diagnosis not present

## 2019-11-27 DIAGNOSIS — D631 Anemia in chronic kidney disease: Secondary | ICD-10-CM | POA: Diagnosis not present

## 2019-11-27 DIAGNOSIS — N186 End stage renal disease: Secondary | ICD-10-CM | POA: Diagnosis not present

## 2019-11-27 DIAGNOSIS — Z992 Dependence on renal dialysis: Secondary | ICD-10-CM | POA: Diagnosis not present

## 2019-11-30 DIAGNOSIS — E1129 Type 2 diabetes mellitus with other diabetic kidney complication: Secondary | ICD-10-CM | POA: Diagnosis not present

## 2019-11-30 DIAGNOSIS — N186 End stage renal disease: Secondary | ICD-10-CM | POA: Diagnosis not present

## 2019-11-30 DIAGNOSIS — N2581 Secondary hyperparathyroidism of renal origin: Secondary | ICD-10-CM | POA: Diagnosis not present

## 2019-11-30 DIAGNOSIS — Z992 Dependence on renal dialysis: Secondary | ICD-10-CM | POA: Diagnosis not present

## 2019-11-30 DIAGNOSIS — D631 Anemia in chronic kidney disease: Secondary | ICD-10-CM | POA: Diagnosis not present

## 2019-12-02 DIAGNOSIS — N2581 Secondary hyperparathyroidism of renal origin: Secondary | ICD-10-CM | POA: Diagnosis not present

## 2019-12-02 DIAGNOSIS — N186 End stage renal disease: Secondary | ICD-10-CM | POA: Diagnosis not present

## 2019-12-02 DIAGNOSIS — E1129 Type 2 diabetes mellitus with other diabetic kidney complication: Secondary | ICD-10-CM | POA: Diagnosis not present

## 2019-12-02 DIAGNOSIS — D631 Anemia in chronic kidney disease: Secondary | ICD-10-CM | POA: Diagnosis not present

## 2019-12-02 DIAGNOSIS — Z992 Dependence on renal dialysis: Secondary | ICD-10-CM | POA: Diagnosis not present

## 2019-12-04 DIAGNOSIS — N186 End stage renal disease: Secondary | ICD-10-CM | POA: Diagnosis not present

## 2019-12-04 DIAGNOSIS — Z992 Dependence on renal dialysis: Secondary | ICD-10-CM | POA: Diagnosis not present

## 2019-12-04 DIAGNOSIS — E1129 Type 2 diabetes mellitus with other diabetic kidney complication: Secondary | ICD-10-CM | POA: Diagnosis not present

## 2019-12-04 DIAGNOSIS — D631 Anemia in chronic kidney disease: Secondary | ICD-10-CM | POA: Diagnosis not present

## 2019-12-04 DIAGNOSIS — N2581 Secondary hyperparathyroidism of renal origin: Secondary | ICD-10-CM | POA: Diagnosis not present

## 2019-12-07 DIAGNOSIS — E1129 Type 2 diabetes mellitus with other diabetic kidney complication: Secondary | ICD-10-CM | POA: Diagnosis not present

## 2019-12-07 DIAGNOSIS — Z992 Dependence on renal dialysis: Secondary | ICD-10-CM | POA: Diagnosis not present

## 2019-12-07 DIAGNOSIS — N186 End stage renal disease: Secondary | ICD-10-CM | POA: Diagnosis not present

## 2019-12-07 DIAGNOSIS — N2581 Secondary hyperparathyroidism of renal origin: Secondary | ICD-10-CM | POA: Diagnosis not present

## 2019-12-07 DIAGNOSIS — D631 Anemia in chronic kidney disease: Secondary | ICD-10-CM | POA: Diagnosis not present

## 2019-12-08 DIAGNOSIS — R0989 Other specified symptoms and signs involving the circulatory and respiratory systems: Secondary | ICD-10-CM | POA: Diagnosis not present

## 2019-12-08 DIAGNOSIS — Z01810 Encounter for preprocedural cardiovascular examination: Secondary | ICD-10-CM | POA: Diagnosis not present

## 2019-12-09 DIAGNOSIS — Z992 Dependence on renal dialysis: Secondary | ICD-10-CM | POA: Diagnosis not present

## 2019-12-09 DIAGNOSIS — E1129 Type 2 diabetes mellitus with other diabetic kidney complication: Secondary | ICD-10-CM | POA: Diagnosis not present

## 2019-12-09 DIAGNOSIS — N186 End stage renal disease: Secondary | ICD-10-CM | POA: Diagnosis not present

## 2019-12-09 DIAGNOSIS — D631 Anemia in chronic kidney disease: Secondary | ICD-10-CM | POA: Diagnosis not present

## 2019-12-09 DIAGNOSIS — N2581 Secondary hyperparathyroidism of renal origin: Secondary | ICD-10-CM | POA: Diagnosis not present

## 2019-12-11 DIAGNOSIS — E1129 Type 2 diabetes mellitus with other diabetic kidney complication: Secondary | ICD-10-CM | POA: Diagnosis not present

## 2019-12-11 DIAGNOSIS — N186 End stage renal disease: Secondary | ICD-10-CM | POA: Diagnosis not present

## 2019-12-11 DIAGNOSIS — Z992 Dependence on renal dialysis: Secondary | ICD-10-CM | POA: Diagnosis not present

## 2019-12-11 DIAGNOSIS — N2581 Secondary hyperparathyroidism of renal origin: Secondary | ICD-10-CM | POA: Diagnosis not present

## 2019-12-11 DIAGNOSIS — D631 Anemia in chronic kidney disease: Secondary | ICD-10-CM | POA: Diagnosis not present

## 2019-12-14 DIAGNOSIS — E1129 Type 2 diabetes mellitus with other diabetic kidney complication: Secondary | ICD-10-CM | POA: Diagnosis not present

## 2019-12-14 DIAGNOSIS — Z992 Dependence on renal dialysis: Secondary | ICD-10-CM | POA: Diagnosis not present

## 2019-12-14 DIAGNOSIS — N2581 Secondary hyperparathyroidism of renal origin: Secondary | ICD-10-CM | POA: Diagnosis not present

## 2019-12-14 DIAGNOSIS — N186 End stage renal disease: Secondary | ICD-10-CM | POA: Diagnosis not present

## 2019-12-14 DIAGNOSIS — D631 Anemia in chronic kidney disease: Secondary | ICD-10-CM | POA: Diagnosis not present

## 2019-12-15 ENCOUNTER — Ambulatory Visit: Payer: Medicare Other | Admitting: Physical Medicine & Rehabilitation

## 2019-12-16 DIAGNOSIS — N186 End stage renal disease: Secondary | ICD-10-CM | POA: Diagnosis not present

## 2019-12-16 DIAGNOSIS — Z992 Dependence on renal dialysis: Secondary | ICD-10-CM | POA: Diagnosis not present

## 2019-12-16 DIAGNOSIS — D631 Anemia in chronic kidney disease: Secondary | ICD-10-CM | POA: Diagnosis not present

## 2019-12-16 DIAGNOSIS — N2581 Secondary hyperparathyroidism of renal origin: Secondary | ICD-10-CM | POA: Diagnosis not present

## 2019-12-16 DIAGNOSIS — E1129 Type 2 diabetes mellitus with other diabetic kidney complication: Secondary | ICD-10-CM | POA: Diagnosis not present

## 2019-12-17 DIAGNOSIS — N186 End stage renal disease: Secondary | ICD-10-CM | POA: Diagnosis not present

## 2019-12-17 DIAGNOSIS — E1129 Type 2 diabetes mellitus with other diabetic kidney complication: Secondary | ICD-10-CM | POA: Diagnosis not present

## 2019-12-17 DIAGNOSIS — D631 Anemia in chronic kidney disease: Secondary | ICD-10-CM | POA: Diagnosis not present

## 2019-12-17 DIAGNOSIS — N2581 Secondary hyperparathyroidism of renal origin: Secondary | ICD-10-CM | POA: Diagnosis not present

## 2019-12-17 DIAGNOSIS — Z992 Dependence on renal dialysis: Secondary | ICD-10-CM | POA: Diagnosis not present

## 2019-12-21 ENCOUNTER — Ambulatory Visit: Payer: Medicare Other | Admitting: Physical Medicine & Rehabilitation

## 2019-12-21 DIAGNOSIS — N2581 Secondary hyperparathyroidism of renal origin: Secondary | ICD-10-CM | POA: Diagnosis not present

## 2019-12-21 DIAGNOSIS — Z992 Dependence on renal dialysis: Secondary | ICD-10-CM | POA: Diagnosis not present

## 2019-12-21 DIAGNOSIS — D631 Anemia in chronic kidney disease: Secondary | ICD-10-CM | POA: Diagnosis not present

## 2019-12-21 DIAGNOSIS — N186 End stage renal disease: Secondary | ICD-10-CM | POA: Diagnosis not present

## 2019-12-21 DIAGNOSIS — E1129 Type 2 diabetes mellitus with other diabetic kidney complication: Secondary | ICD-10-CM | POA: Diagnosis not present

## 2019-12-22 ENCOUNTER — Other Ambulatory Visit: Payer: Self-pay

## 2019-12-22 ENCOUNTER — Encounter: Payer: Self-pay | Admitting: Physical Medicine & Rehabilitation

## 2019-12-22 ENCOUNTER — Encounter: Payer: Medicare Other | Attending: Physical Medicine & Rehabilitation | Admitting: Physical Medicine & Rehabilitation

## 2019-12-22 VITALS — BP 112/73 | HR 91 | Temp 97.2°F | Ht 78.0 in | Wt 312.0 lb

## 2019-12-22 DIAGNOSIS — G479 Sleep disorder, unspecified: Secondary | ICD-10-CM | POA: Diagnosis not present

## 2019-12-22 DIAGNOSIS — N186 End stage renal disease: Secondary | ICD-10-CM | POA: Diagnosis not present

## 2019-12-22 DIAGNOSIS — G6289 Other specified polyneuropathies: Secondary | ICD-10-CM | POA: Diagnosis not present

## 2019-12-22 DIAGNOSIS — E0842 Diabetes mellitus due to underlying condition with diabetic polyneuropathy: Secondary | ICD-10-CM | POA: Insufficient documentation

## 2019-12-22 DIAGNOSIS — Z992 Dependence on renal dialysis: Secondary | ICD-10-CM | POA: Diagnosis not present

## 2019-12-22 DIAGNOSIS — R269 Unspecified abnormalities of gait and mobility: Secondary | ICD-10-CM | POA: Insufficient documentation

## 2019-12-22 NOTE — Progress Notes (Signed)
Indication:  Chronic neuropathic leg pain secondary to diabetes and ESRD only partially responsive to physical therapy, numerous and numerous medications.  Informed consent was obtained after discussing risks and benefits of the procedure with patient these include bleeding, bruising, and infection.  The patient elects to proceed and has given written consent.  The patient was brought into the procedure room and placed in the lateral decubitus position.    The left lateral thigh area was cleaned and prepped with povidone-iodine with a wide margin and allowed to dry.  Sterile drape was placed with fenestration over the target site.  The peripheral neurostimulator was placed outside of the sterile field over clean skin on upper back of the affected side.    Using ultrasound, the sciatic nerve was visualized. The skin overlying the lead site was anesthetized with 4cc of 1% Lidocaine. Care was taken to assure that local anesthetic was not administered close to the target neurostimulator lead site to prevent an altered response to stimulation.   Under sterile technique, ultrasound visualization of the sciatic nerve was obtained prior to needle insertion. Test stimulation was delivered via a 17 gauge percutaneous sleeve and 19 gauge stimulating probe inside the sleeve to assist in identifying the optimal lead location.  Superficial and lateral to the sciatic nerve was targeted Once this point was located, the stimulating probe was inserted at the marked point above using sterile technique.  The probe was advanced  to a depth of about 3 cm.     A provided test cable was connected to the probe and the intensity was adjusted until comfortable sensation was produced by the neurostimulator and detected by the patient. Once optimal location was identified, stimulation was turned off, the test cable was disconnected from the stimulating probe, and the stimulating probe was removed from the sleeve.  The 20 gauge  Microlead Introducer containing the lead was inserted into the percutaneous sleeve and advanced to the established target depth. The connector box was connected to the de-inuslated end of the lead and the cable was connected to the Stimulator. Optimized stimulation parameters were tested again beginning at slightly lower intensity. Intensity was adjusted until the desired response was obtained. The location and depth of the electrode was noted. Stimulation was turned off. The connector box was  disconnected from the single lead microlead, and the introducer and sleeve were then withdrawn while manual pressure was applied at the exit site at which point the lead was deployed, secured and implanted. A drop of dermabond was placed at the electrode exit site.  The connector box was reconnected and stimulation was again delivered to confirm that the lead did not move upon removal of the introducer. The lead position was confirmed by the presence of comfortable sensations in the painful areas. The lead was coiled to allow for strain relief then threaded through and secured into the connector box and secured in a cradle.  Excess lead was trimmed to length. The cable was inserted into the neurostimulator and the neurostimulator was positioned on the anterior thigh below the lead exit site at which point the final stimulation response again confirmed optimal lead placement.   The area, including the lead, connecting box and cable, was dressed with occlusive dressing and the patient and caregiver were instructed on the proper management of the site and how to operate the External Pulse Generator (EPG) and the hand held patient programmer.   Follow up appt in 2 wks

## 2019-12-23 DIAGNOSIS — N2581 Secondary hyperparathyroidism of renal origin: Secondary | ICD-10-CM | POA: Diagnosis not present

## 2019-12-23 DIAGNOSIS — N186 End stage renal disease: Secondary | ICD-10-CM | POA: Diagnosis not present

## 2019-12-23 DIAGNOSIS — D631 Anemia in chronic kidney disease: Secondary | ICD-10-CM | POA: Diagnosis not present

## 2019-12-23 DIAGNOSIS — Z992 Dependence on renal dialysis: Secondary | ICD-10-CM | POA: Diagnosis not present

## 2019-12-23 DIAGNOSIS — E1129 Type 2 diabetes mellitus with other diabetic kidney complication: Secondary | ICD-10-CM | POA: Diagnosis not present

## 2019-12-25 DIAGNOSIS — Z992 Dependence on renal dialysis: Secondary | ICD-10-CM | POA: Diagnosis not present

## 2019-12-25 DIAGNOSIS — N2581 Secondary hyperparathyroidism of renal origin: Secondary | ICD-10-CM | POA: Diagnosis not present

## 2019-12-25 DIAGNOSIS — N186 End stage renal disease: Secondary | ICD-10-CM | POA: Diagnosis not present

## 2019-12-25 DIAGNOSIS — E1129 Type 2 diabetes mellitus with other diabetic kidney complication: Secondary | ICD-10-CM | POA: Diagnosis not present

## 2019-12-25 DIAGNOSIS — D631 Anemia in chronic kidney disease: Secondary | ICD-10-CM | POA: Diagnosis not present

## 2019-12-26 DIAGNOSIS — N186 End stage renal disease: Secondary | ICD-10-CM | POA: Diagnosis not present

## 2019-12-26 DIAGNOSIS — T8620 Unspecified complication of heart transplant: Secondary | ICD-10-CM | POA: Diagnosis not present

## 2019-12-26 DIAGNOSIS — Z992 Dependence on renal dialysis: Secondary | ICD-10-CM | POA: Diagnosis not present

## 2020-01-05 ENCOUNTER — Encounter: Payer: Medicare Other | Admitting: Physical Medicine & Rehabilitation

## 2020-01-05 DIAGNOSIS — Z992 Dependence on renal dialysis: Secondary | ICD-10-CM | POA: Diagnosis not present

## 2020-01-05 DIAGNOSIS — N186 End stage renal disease: Secondary | ICD-10-CM | POA: Diagnosis not present

## 2020-01-05 DIAGNOSIS — N2581 Secondary hyperparathyroidism of renal origin: Secondary | ICD-10-CM | POA: Diagnosis not present

## 2020-01-07 DIAGNOSIS — Z992 Dependence on renal dialysis: Secondary | ICD-10-CM | POA: Diagnosis not present

## 2020-01-07 DIAGNOSIS — N186 End stage renal disease: Secondary | ICD-10-CM | POA: Diagnosis not present

## 2020-01-07 DIAGNOSIS — N2581 Secondary hyperparathyroidism of renal origin: Secondary | ICD-10-CM | POA: Diagnosis not present

## 2020-01-09 DIAGNOSIS — Z992 Dependence on renal dialysis: Secondary | ICD-10-CM | POA: Diagnosis not present

## 2020-01-09 DIAGNOSIS — N2581 Secondary hyperparathyroidism of renal origin: Secondary | ICD-10-CM | POA: Diagnosis not present

## 2020-01-09 DIAGNOSIS — N186 End stage renal disease: Secondary | ICD-10-CM | POA: Diagnosis not present

## 2020-01-12 DIAGNOSIS — N2581 Secondary hyperparathyroidism of renal origin: Secondary | ICD-10-CM | POA: Diagnosis not present

## 2020-01-12 DIAGNOSIS — N186 End stage renal disease: Secondary | ICD-10-CM | POA: Diagnosis not present

## 2020-01-12 DIAGNOSIS — Z992 Dependence on renal dialysis: Secondary | ICD-10-CM | POA: Diagnosis not present

## 2020-01-14 ENCOUNTER — Encounter: Payer: Medicare Other | Admitting: Physical Medicine & Rehabilitation

## 2020-01-14 DIAGNOSIS — Z888 Allergy status to other drugs, medicaments and biological substances status: Secondary | ICD-10-CM | POA: Diagnosis not present

## 2020-01-14 DIAGNOSIS — N2581 Secondary hyperparathyroidism of renal origin: Secondary | ICD-10-CM | POA: Diagnosis not present

## 2020-01-14 DIAGNOSIS — S82831A Other fracture of upper and lower end of right fibula, initial encounter for closed fracture: Secondary | ICD-10-CM | POA: Diagnosis not present

## 2020-01-14 DIAGNOSIS — N186 End stage renal disease: Secondary | ICD-10-CM | POA: Diagnosis not present

## 2020-01-14 DIAGNOSIS — Z992 Dependence on renal dialysis: Secondary | ICD-10-CM | POA: Diagnosis not present

## 2020-01-15 ENCOUNTER — Telehealth: Payer: Self-pay | Admitting: *Deleted

## 2020-01-15 NOTE — Telephone Encounter (Signed)
Called lvm for pt to call back and schedule appt for ankle 01/15/20

## 2020-01-15 NOTE — Telephone Encounter (Signed)
Pt's dtr, states pt has a right ankle spiral fracture and needs to see his doctor. Call received from [phone].

## 2020-01-18 ENCOUNTER — Ambulatory Visit: Payer: Medicare Other | Admitting: Physical Medicine & Rehabilitation

## 2020-01-18 ENCOUNTER — Telehealth: Payer: Self-pay | Admitting: *Deleted

## 2020-01-18 ENCOUNTER — Telehealth: Payer: Self-pay

## 2020-01-18 DIAGNOSIS — S8261XA Displaced fracture of lateral malleolus of right fibula, initial encounter for closed fracture: Secondary | ICD-10-CM | POA: Diagnosis not present

## 2020-01-18 DIAGNOSIS — M25571 Pain in right ankle and joints of right foot: Secondary | ICD-10-CM | POA: Diagnosis not present

## 2020-01-18 NOTE — Telephone Encounter (Signed)
Patient wants to discuss removing stimulator.

## 2020-01-18 NOTE — Telephone Encounter (Signed)
Pt states he is calling to see when his appt is, he is out of town and has a broken foot.

## 2020-01-19 ENCOUNTER — Telehealth: Payer: Self-pay | Admitting: *Deleted

## 2020-01-19 DIAGNOSIS — Z992 Dependence on renal dialysis: Secondary | ICD-10-CM | POA: Diagnosis not present

## 2020-01-19 DIAGNOSIS — N186 End stage renal disease: Secondary | ICD-10-CM | POA: Diagnosis not present

## 2020-01-19 DIAGNOSIS — E8770 Fluid overload, unspecified: Secondary | ICD-10-CM | POA: Diagnosis not present

## 2020-01-19 DIAGNOSIS — N2581 Secondary hyperparathyroidism of renal origin: Secondary | ICD-10-CM | POA: Diagnosis not present

## 2020-01-19 NOTE — Telephone Encounter (Signed)
Will call patient to discuss.  This is peculiar as he wanted a lead in his other leg not too long ago.  Thanks.

## 2020-01-19 NOTE — Telephone Encounter (Signed)
Pt called states he needs to cancel his appt for tomorrow, it conflicts with his other doctors appts that may include surgery.

## 2020-01-20 ENCOUNTER — Ambulatory Visit: Payer: Medicare Other | Admitting: Podiatry

## 2020-01-20 DIAGNOSIS — S8261XD Displaced fracture of lateral malleolus of right fibula, subsequent encounter for closed fracture with routine healing: Secondary | ICD-10-CM | POA: Diagnosis not present

## 2020-01-20 DIAGNOSIS — L97509 Non-pressure chronic ulcer of other part of unspecified foot with unspecified severity: Secondary | ICD-10-CM | POA: Diagnosis not present

## 2020-01-21 ENCOUNTER — Encounter: Payer: Medicare Other | Attending: Physical Medicine & Rehabilitation | Admitting: Physical Medicine & Rehabilitation

## 2020-01-21 ENCOUNTER — Other Ambulatory Visit: Payer: Self-pay

## 2020-01-21 ENCOUNTER — Encounter: Payer: Self-pay | Admitting: Physical Medicine & Rehabilitation

## 2020-01-21 VITALS — BP 100/65 | HR 90 | Temp 97.9°F | Ht 77.0 in | Wt 315.0 lb

## 2020-01-21 DIAGNOSIS — E0842 Diabetes mellitus due to underlying condition with diabetic polyneuropathy: Secondary | ICD-10-CM | POA: Insufficient documentation

## 2020-01-21 DIAGNOSIS — Z992 Dependence on renal dialysis: Secondary | ICD-10-CM | POA: Diagnosis not present

## 2020-01-21 DIAGNOSIS — G479 Sleep disorder, unspecified: Secondary | ICD-10-CM | POA: Diagnosis not present

## 2020-01-21 DIAGNOSIS — R269 Unspecified abnormalities of gait and mobility: Secondary | ICD-10-CM | POA: Diagnosis not present

## 2020-01-21 DIAGNOSIS — N186 End stage renal disease: Secondary | ICD-10-CM | POA: Insufficient documentation

## 2020-01-21 DIAGNOSIS — G6289 Other specified polyneuropathies: Secondary | ICD-10-CM | POA: Insufficient documentation

## 2020-01-21 MED ORDER — PREGABALIN 25 MG PO CAPS: ORAL_CAPSULE | ORAL | 1 refills | Status: DC

## 2020-01-21 MED ORDER — PREGABALIN 75 MG PO CAPS: 75.0000 mg | ORAL_CAPSULE | Freq: Every day | ORAL | 1 refills | Status: DC

## 2020-01-21 NOTE — Progress Notes (Signed)
Subjective:    Patient ID: [name], male    DOB: Dec 31, 1960, 59 y.o.   MRN: [id number]  HPI Male with pmh of heart transplant, ESRD, prostate CA, OSA, obesity, CHF, GERD, DM, CVA with residual left hand weakness, presents with peripheral neuropathy.  Initially stated: B/l knees distally and b/l hands. Started after transplant and dialysis in ~2015.  Getting progressively worse.  Worse prior to bed.  HD exacerbates the pain.  Off HD days better. Pins/needles/tingling.  Associated numbness. Lyrica helps slightly.  Denies falls. Pain limits ambulation.   Last clinic visit on 12/22/19.  Pt had PNS implantation at that time.  Since that time, communicated with patients wife, and coordinated with rep as well as office regarding second implantation, however, discussed with wife, who states that patient now wants the first lead removed early. Patient also had a fall fracturing his right tibia after a fall with plans for boot. Patient states he was "shocked" twice.  Pt states he is frustrated and wants it removed.  He states he never received follow up for TENS.   Pain Inventory Average Pain 8 Pain Right Now 8 My pain is constant, sharp, burning, dull, stabbing, tingling and aching  In the last 24 hours, has pain interfered with the following? General activity 5 Relation with others 5 Enjoyment of life 5 What TIME of day is your pain at its worst? all Sleep (in general) Good  Pain is worse with: walking, bending, sitting, inactivity, standing, unsure and some activites Pain improves with: rest, heat/ice, therapy/exercise and pacing activities Relief from Meds: na  Mobility walk without assistance walk with assistance use a cane use a walker ability to climb steps?  yes do you drive?  no Do you have any goals in this area?  yes  Function retired  Neuro/Psych weakness tingling  Prior Studies Any changes since last visit?  no  Physicians involved in your care Any changes  since last visit?  no   Family History  Problem Relation Age of Onset  . Coronary artery disease Father        had, PTCA & CABG  . Heart attack Father   . Hypertension Father   . Diabetes Father   . Prostate cancer Father   . Coronary artery disease Mother   . Heart attack Mother   . Hypertension Mother   . Stroke Mother   . Lupus Mother   . Prostate cancer Brother   . Coronary artery disease Brother   . Coronary artery disease Sister   . Heart attack Sister   . Prostate cancer Paternal Uncle   . Coronary artery disease Maternal Grandmother   . Coronary artery disease Maternal Grandfather   . Coronary artery disease Paternal Grandmother   . Coronary artery disease Paternal Grandfather    Social History   Socioeconomic History  . Marital status: Married    Spouse name: Nicki Reaper  . Number of children: 1  . Years of education: 37  . Highest education level: Not on file  Occupational History  . Occupation: retired    Fish farm manager: [employer]  Tobacco Use  . Smoking status: Never Smoker  . Smokeless tobacco: Former Systems developer    Types: Snuff  Substance and Sexual Activity  . Alcohol use: No  . Drug use: No  . Sexual activity: Not Currently  Other Topics Concern  . Not on file  Social History Narrative   Lives with wife in a one story home.  Has one child.     Retired from the [employer].  Supervisor over transportation.    Education: college.    Social Determinants of Health   Financial Resource Strain:   . Difficulty of Paying Living Expenses:   Food Insecurity:   . Worried About Charity fundraiser in the Last Year:   . Arboriculturist in the Last Year:   Transportation Needs:   . Film/video editor (Medical):   Marland Kitchen Lack of Transportation (Non-Medical):   Physical Activity:   . Days of Exercise per Week:   . Minutes of Exercise per Session:   Stress:   . Feeling of Stress :   Social Connections:   . Frequency of Communication with Friends and  Family:   . Frequency of Social Gatherings with Friends and Family:   . Attends Religious Services:   . Active Member of Clubs or Organizations:   . Attends Archivist Meetings:   Marland Kitchen Marital Status:    Past Surgical History:  Procedure Laterality Date  . CARDIAC PACEMAKER PLACEMENT  09/21/2009   Biventricular implantable cardioverter-defibrillator implantation     . COLONOSCOPY W/ BIOPSIES AND POLYPECTOMY    . FOOT SURGERY     left  . HEART TRANSPLANT  2014  . LAPAROSCOPIC GASTRIC BANDING  01/27/2007  . LEFT VENTRICULAR ASSIST DEVICE     implanted at [facility]  . PROSTATE BIOPSY    . TOTAL HIP ARTHROPLASTY Right 07/22/2017   Procedure: RIGHT TOTAL HIP ARTHROPLASTY ANTERIOR APPROACH;  Surgeon: Rod Can, MD;  Location: [facility];  Service: Orthopedics;  Laterality: Right;  Needs RNFA  . TOTAL KNEE ARTHROPLASTY Right 07/22/2017   Past Medical History:  Diagnosis Date  . Arthritis   . Atrial fibrillation ([place])   . CHF (congestive heart failure), NYHA class III (HCC)    s/p heart transplant  . Chronic kidney disease    end stage  . Chronic systolic dysfunction of left ventricle   . CVA (cerebral infarction)   . Diabetes mellitus    PMH; Prior to heart transplant  . Family history of coronary artery disease    in both parents  . GERD (gastroesophageal reflux disease)   . Hyperlipidemia   . Hypertension   . Left bundle branch block   . Morbid obesity ([place])    status post lap band  . Myocardial infarction [facility])    prior to heart transplant  . Nonischemic cardiomyopathy ([place])    prior to heart transplant  . Obesity (BMI 30-39.9)   . Obstructive sleep apnea    no longer needs CPAP after heart transplant per pt  . Premature ventricular contractions   . Prostate cancer ([place])   . SOB (shortness of breath)    BP 100/65   Pulse 90   Temp 97.9 F (36.6 C)   Ht 6\' 5"  (1.956 m)   Wt (!) 315 lb (142.9 kg)   SpO2 96%   BMI 37.35 kg/m   Opioid Risk Score:   Fall  Risk Score:  `1  Depression screen PHQ 2/9  Depression screen [facility] 2/9 01/21/2020 12/22/2019  Decreased Interest 0 0  Down, Depressed, Hopeless 0 0  PHQ - 2 Score 0 0     Review of Systems  Constitutional: Negative.   HENT: Negative.   Eyes: Negative.   Respiratory: Negative.   Cardiovascular: Negative.   Gastrointestinal: Negative.   Endocrine: Negative.   Genitourinary: Negative.   Musculoskeletal: Positive for arthralgias,  gait problem and myalgias.  Skin: Negative.   Allergic/Immunologic: Negative.   Neurological: Positive for dizziness, tremors, weakness and numbness.       Tingling  Hematological: Negative.   Psychiatric/Behavioral: Positive for confusion and dysphoric mood. The patient is nervous/anxious.   All other systems reviewed and are negative.     Objective:   Physical Exam  Constitutional: NAD. Respiratory: Normal effort.   Psych: Normal mood.  Normal behavior. Musc: No edema in extremities.  No tenderness in extremities. Gait: Mildly antalgic Neuro: Alert Tremors, unchanged Mild atrophy in hands Motor: B/l LE 4+/5, except for right ankle - braced Sensation diminished to light touch distal to b/l knees, unchanged    Assessment & Plan:  Male with pmh of heart transplant, ESRD, prostate CA, OSA, obesity, CHF, GERD, DM, CVA with residual left hand weakness, presents with peripheral neuropathy.   1. Polyneuropathy - multifactorial related to DM, HD             In distal extremities, worse with ambulation              Cont Heat             Continue Lidocaine ointment/patch             Cont Epson salt baths             Referred for ESTIM, pt states he never received call back, reminded follow up again             Continue Lyrica to 75 daily with increase in additional to 125mg  post HD             Continue Cymbalta             Will consider Robaxin              Will consider Referral to Psychology             Patient states main goal is to ambulate more,  which he is doing             Will consider Capsicin, baths           PNS implanted on 12/22/19, however, pt states he wants the lead removed. After break-up of tissue around each site, with gentle traction and superficial skin compression, PNS lead slowly removed on 01/21/20 with lead intact. Patient tolerated procedure well. No complications.  2. Gait abnormality             Cont boot to RLE  Cont cane again  3. Sleep disturbance             See #1             Will consider Elavil, will hold off on present             No longer requires CPAP             Improving   4. Morbid Obesity             Cont follow up with dietitian at HD             Slowly losing weight, continue to encourage             Contributing to pain  5. Right tibia fracture  Follow up with Ortho  Cont boot  >50 minutes spent in total in counseling and coordination regarding PNS, second placements, possible removal, education on device, possible solutions and eventual lead removal

## 2020-02-02 ENCOUNTER — Ambulatory Visit: Payer: Medicare Other | Admitting: Physical Medicine & Rehabilitation

## 2020-02-03 DIAGNOSIS — D699 Hemorrhagic condition, unspecified: Secondary | ICD-10-CM | POA: Diagnosis not present

## 2020-02-03 DIAGNOSIS — N5235 Erectile dysfunction following radiation therapy: Secondary | ICD-10-CM | POA: Diagnosis not present

## 2020-02-10 ENCOUNTER — Encounter (HOSPITAL_COMMUNITY): Payer: Self-pay

## 2020-02-10 ENCOUNTER — Emergency Department (HOSPITAL_COMMUNITY): Payer: Medicare Other

## 2020-02-10 ENCOUNTER — Emergency Department (HOSPITAL_COMMUNITY)
Admission: EM | Admit: 2020-02-10 | Discharge: 2020-02-10 | Disposition: A | Discharge status: Home / Self Care | Payer: Medicare Other | Attending: Emergency Medicine | Admitting: Emergency Medicine

## 2020-02-10 ENCOUNTER — Other Ambulatory Visit: Payer: Self-pay

## 2020-02-10 DIAGNOSIS — E1151 Type 2 diabetes mellitus with diabetic peripheral angiopathy without gangrene: Secondary | ICD-10-CM | POA: Insufficient documentation

## 2020-02-10 DIAGNOSIS — M25571 Pain in right ankle and joints of right foot: Secondary | ICD-10-CM | POA: Diagnosis not present

## 2020-02-10 DIAGNOSIS — S82831D Other fracture of upper and lower end of right fibula, subsequent encounter for closed fracture with routine healing: Secondary | ICD-10-CM | POA: Insufficient documentation

## 2020-02-10 DIAGNOSIS — Z992 Dependence on renal dialysis: Secondary | ICD-10-CM | POA: Insufficient documentation

## 2020-02-10 DIAGNOSIS — Z96641 Presence of right artificial hip joint: Secondary | ICD-10-CM | POA: Diagnosis not present

## 2020-02-10 DIAGNOSIS — X58XXXD Exposure to other specified factors, subsequent encounter: Secondary | ICD-10-CM | POA: Diagnosis not present

## 2020-02-10 DIAGNOSIS — I5022 Chronic systolic (congestive) heart failure: Secondary | ICD-10-CM | POA: Insufficient documentation

## 2020-02-10 DIAGNOSIS — Z941 Heart transplant status: Secondary | ICD-10-CM | POA: Diagnosis not present

## 2020-02-10 DIAGNOSIS — I959 Hypotension, unspecified: Secondary | ICD-10-CM | POA: Diagnosis not present

## 2020-02-10 DIAGNOSIS — E1122 Type 2 diabetes mellitus with diabetic chronic kidney disease: Secondary | ICD-10-CM | POA: Diagnosis not present

## 2020-02-10 DIAGNOSIS — Z8546 Personal history of malignant neoplasm of prostate: Secondary | ICD-10-CM | POA: Insufficient documentation

## 2020-02-10 DIAGNOSIS — M25562 Pain in left knee: Secondary | ICD-10-CM | POA: Diagnosis not present

## 2020-02-10 DIAGNOSIS — R42 Dizziness and giddiness: Secondary | ICD-10-CM | POA: Diagnosis present

## 2020-02-10 DIAGNOSIS — I120 Hypertensive chronic kidney disease with stage 5 chronic kidney disease or end stage renal disease: Secondary | ICD-10-CM | POA: Diagnosis not present

## 2020-02-10 DIAGNOSIS — N186 End stage renal disease: Secondary | ICD-10-CM | POA: Diagnosis not present

## 2020-02-10 DIAGNOSIS — E1142 Type 2 diabetes mellitus with diabetic polyneuropathy: Secondary | ICD-10-CM | POA: Diagnosis not present

## 2020-02-10 DIAGNOSIS — S8261XD Displaced fracture of lateral malleolus of right fibula, subsequent encounter for closed fracture with routine healing: Secondary | ICD-10-CM | POA: Diagnosis not present

## 2020-02-10 DIAGNOSIS — I132 Hypertensive heart and chronic kidney disease with heart failure and with stage 5 chronic kidney disease, or end stage renal disease: Secondary | ICD-10-CM | POA: Insufficient documentation

## 2020-02-10 DIAGNOSIS — R55 Syncope and collapse: Secondary | ICD-10-CM

## 2020-02-10 LAB — I-STAT CHEM 8, ED
BUN: 15 mg/dL (ref 6–20)
Calcium, Ion: 0.98 mmol/L — ABNORMAL LOW (ref 1.15–1.40)
Chloride: 92 mmol/L — ABNORMAL LOW (ref 98–111)
Creatinine, Ser: 5.6 mg/dL — ABNORMAL HIGH (ref 0.61–1.24)
Glucose, Bld: 119 mg/dL — ABNORMAL HIGH (ref 70–99)
HCT: 32 % — ABNORMAL LOW (ref 39.0–52.0)
Hemoglobin: 10.9 g/dL — ABNORMAL LOW (ref 13.0–17.0)
Potassium: 3.6 mmol/L (ref 3.5–5.1)
Sodium: 138 mmol/L (ref 135–145)
TCO2: 31 mmol/L (ref 22–32)

## 2020-02-10 NOTE — ED Provider Notes (Signed)
[facility] EMERGENCY DEPARTMENT Provider Note   CSN: [id number] Arrival date & time: 02/10/20  1655     History Chief Complaint  Patient presents with  . Dizziness    [name] is a 59 y.o. male.  HPI Patient was walking back into his home following transport there, after dialysis when he stumbled while walking to the house.  He states that he fell and reinjured his right ankle, and both knees.  EMS was summoned and found his blood pressure to be low "in the 23J systolic."  During transfer he received a saline bolus, 125 cc with improvement of his blood pressure to 93/63.  Patient states he ate breakfast earlier this morning, but no oral intake since then.  He denies fever, cough, vomiting, diarrhea.  He injured his right ankle several weeks ago, and had a follow-up with his orthopedist, today for reevaluation of a distal fibula fracture.  He is concerned that he reinjured his right ankle.  There are no other known modifying factors.    Past Medical History:  Diagnosis Date  . Arthritis   . Atrial fibrillation ([place])   . CHF (congestive heart failure), NYHA class III (HCC)    s/p heart transplant  . Chronic kidney disease    end stage  . Chronic systolic dysfunction of left ventricle   . CVA (cerebral infarction)   . Diabetes mellitus    PMH; Prior to heart transplant  . Family history of coronary artery disease    in both parents  . GERD (gastroesophageal reflux disease)   . Hyperlipidemia   . Hypertension   . Left bundle branch block   . Morbid obesity ([place])    status post lap band  . Myocardial infarction [facility])    prior to heart transplant  . Nonischemic cardiomyopathy ([place])    prior to heart transplant  . Obesity (BMI 30-39.9)   . Obstructive sleep apnea    no longer needs CPAP after heart transplant per pt  . Premature ventricular contractions   . Prostate cancer ([place])   . SOB (shortness of breath)     Patient Active Problem List    Diagnosis Date Noted  . Morbid obesity ([place]) 11/19/2019  . Sleep disorder 10/22/2019  . Sleep disturbance 10/22/2019  . Abnormality of gait 06/09/2019  . Diabetic polyneuropathy associated with diabetes mellitus due to underlying condition ([place]) 04/14/2019  . Peripheral neuropathy 12/11/2018  . CVA (cerebral vascular accident) ([place]) 11/09/2018  . H/O heart transplant ([place]) 11/09/2018  . Malignant neoplasm of prostate ([place]) 05/06/2018  . Closed posterior dislocation of hip, right, initial encounter ([place]) 08/24/2017  . End stage renal disease on dialysis ([place]) 08/24/2017  . Hip dislocation, right ([place]) 08/24/2017  . Osteoarthritis of right hip 07/22/2017  . VENTRICULAR TACHYCARDIA 12/21/2009  . Dyslipidemia 08/30/2009  . Essential hypertension 08/30/2009  . ISCHEMIC CARDIOMYOPATHY 08/30/2009  . Atrial fibrillation ([place]) 08/30/2009  . VENTRICULAR ECTOPY 08/30/2009  . CEREBROVASCULAR ACCIDENT, HX OF 08/30/2009  . Obesity, Class III, BMI 40-49.9 (morbid obesity) ([place]) 08/30/2009  . Diabetes mellitus type 2 in obese ([place]) 10/17/2007  . OBSTRUCTIVE SLEEP APNEA 10/17/2007  . CARDIOMYOPATHY, DILATED 10/17/2007    Past Surgical History:  Procedure Laterality Date  . CARDIAC PACEMAKER PLACEMENT  09/21/2009   Biventricular implantable cardioverter-defibrillator implantation     . COLONOSCOPY W/ BIOPSIES AND POLYPECTOMY    . FOOT SURGERY     left  . HEART TRANSPLANT  2014  . LAPAROSCOPIC GASTRIC  BANDING  01/27/2007  . LEFT VENTRICULAR ASSIST DEVICE     implanted at [facility]  . PROSTATE BIOPSY    . TOTAL HIP ARTHROPLASTY Right 07/22/2017   Procedure: RIGHT TOTAL HIP ARTHROPLASTY ANTERIOR APPROACH;  Surgeon: Rod Can, MD;  Location: [facility];  Service: Orthopedics;  Laterality: Right;  Needs RNFA  . TOTAL KNEE ARTHROPLASTY Right 07/22/2017       Family History  Problem Relation Age of Onset  . Coronary artery disease Father        had, PTCA & CABG  . Heart attack Father   .  Hypertension Father   . Diabetes Father   . Prostate cancer Father   . Coronary artery disease Mother   . Heart attack Mother   . Hypertension Mother   . Stroke Mother   . Lupus Mother   . Prostate cancer Brother   . Coronary artery disease Brother   . Coronary artery disease Sister   . Heart attack Sister   . Prostate cancer Paternal Uncle   . Coronary artery disease Maternal Grandmother   . Coronary artery disease Maternal Grandfather   . Coronary artery disease Paternal Grandmother   . Coronary artery disease Paternal Grandfather     Social History   Tobacco Use  . Smoking status: Never Smoker  . Smokeless tobacco: Former Systems developer    Types: Snuff  Vaping Use  . Vaping Use: Never used  Substance Use Topics  . Alcohol use: No  . Drug use: No    Home Medications Prior to Admission medications   Medication Sig Start Date End Date Taking? Authorizing Provider  aspirin EC 81 MG tablet Take 81 mg by mouth daily.    [provider]  B Complex-C-Folic Acid (RENO CAPS) 1 MG CAPS Take 1 capsule by mouth daily. 08/09/17   [provider]  Cinacalcet HCl (SENSIPAR PO) Take 1 tablet by mouth 3 (three) times a week. 12/07/19 12/05/20  [provider]  citalopram (CELEXA) 20 MG tablet Take 20 mg by mouth daily.    [provider]  cycloSPORINE modified (NEORAL) 100 MG capsule Take 100 mg by mouth 2 (two) times daily.    [provider]  cycloSPORINE modified (NEORAL) 25 MG capsule Take by mouth 2 (two) times daily.    [provider]  docusate sodium (COLACE) 100 MG capsule Take 100 mg by mouth 2 (two) times daily.    [provider]  DULoxetine (CYMBALTA) 30 MG capsule Take 60 mg by mouth daily.    [provider]  guaifenesin (HUMIBID E) 400 MG TABS tablet Take 400 mg by mouth every 4 (four) hours.    [provider]  lidocaine (XYLOCAINE) 5 % ointment Apply 1 application topically as needed. 04/14/19   Jamse Arn, MD  lidocaine-prilocaine (EMLA) cream Apply 1 application topically as needed.    [provider]  Methoxy PEG-Epoetin Beta (MIRCERA IJ) Every 2 weeks in dialysis 11/30/19 11/28/20  [provider]  mycophenolate (MYFORTIC) 360 MG TBEC EC tablet Take 720 mg by mouth 2 (two) times daily.    [provider]  omeprazole (PRILOSEC) 20 MG capsule Take 20 mg by mouth daily.  04/06/16   [provider]  ondansetron (ZOFRAN) 4 MG tablet Take 1 tablet (4 mg total) by mouth every 6 (six) hours as needed for nausea. Patient taking differently: Take 4 mg by mouth daily.  07/23/17   Swinteck, Aaron Edelman, MD  OXcarbazepine (TRILEPTAL) 150 MG  tablet Take 1 tablet (150 mg total) by mouth 2 (two) times daily. 12/11/18   Marcial Pacas, MD  pravastatin (PRAVACHOL) 40 MG tablet Take 1 tablet (40 mg total) by mouth daily at 6 PM. Patient taking differently: Take 20 mg by mouth at bedtime.  07/24/17   Swinteck, Aaron Edelman, MD  pregabalin (LYRICA) 25 MG capsule 125mg  After dialysis 01/21/20   Jamse Arn, MD  pregabalin (LYRICA) 75 MG capsule Take 1 capsule (75 mg total) by mouth daily. 01/21/20   Jamse Arn, MD  rosuvastatin (CRESTOR) 10 MG tablet Take 1 tablet by mouth at bedtime. 12/01/19   [provider]  senna (SENOKOT) 8.6 MG TABS tablet Take 2 tablets (17.2 mg total) by mouth at bedtime. Patient taking differently: Take 2 tablets by mouth daily as needed for mild constipation.  07/23/17   Swinteck, Aaron Edelman, MD  sevelamer carbonate (RENVELA) 800 MG tablet Take 2 tablets (1,600 mg total) by mouth 3 (three) times daily with meals. Patient taking differently: Take 800 mg by mouth 3 (three) times daily with meals.  11/11/18   Kayleen Memos, DO  temazepam (RESTORIL) 15 MG capsule Take 15 mg by mouth at bedtime as needed for sleep.    [provider]  traZODone (DESYREL) 50 MG tablet Take 50 mg by mouth at bedtime.    [provider]    Allergies      Heparin and Penicillin g potassium [penicillin g]  Review of Systems   Review of Systems  All other systems reviewed and are negative.   Physical Exam Updated Vital Signs BP 106/75   Pulse 86   Temp 98.1 F (36.7 C) (Oral)   Resp 17   SpO2 99%   Physical Exam Vitals and nursing note reviewed.  Constitutional:      General: He is not in acute distress.    Appearance: He is well-developed. He is not ill-appearing, toxic-appearing or diaphoretic.  HENT:     Head: Normocephalic and atraumatic.     Right Ear: External ear normal.     Left Ear: External ear normal.  Eyes:     Conjunctiva/sclera: Conjunctivae normal.     Pupils: Pupils are equal, round, and reactive to light.  Neck:     Trachea: Phonation normal.  Cardiovascular:     Rate and Rhythm: Normal rate.  Pulmonary:     Effort: Pulmonary effort is normal.  Abdominal:     General: There is no distension.  Musculoskeletal:        General: Normal range of motion.     Cervical back: Normal range of motion and neck supple.     Comments: Tender both knees, primarily popliteal region, without deformity or swelling.  Tender right ankle with mild swelling.  Skin:    General: Skin is warm and dry.     Comments: Nonbleeding abrasion mid right anterior shin.  Neurological:     Mental Status: He is alert and oriented to person, place, and time.     Cranial Nerves: No cranial nerve deficit.     Sensory: No sensory deficit.     Motor: No abnormal muscle tone.     Coordination: Coordination normal.  Psychiatric:        Mood and Affect: Mood normal.        Behavior: Behavior normal.        Thought Content: Thought content normal.        Judgment: Judgment normal.     ED Results /  Procedures / Treatments   Labs (all labs ordered are listed, but only abnormal results are displayed) Labs Reviewed  I-STAT CHEM 8, ED - Abnormal; Notable for the following components:      Result Value   Chloride 92 (*)    Creatinine, Ser  5.60 (*)    Glucose, Bld 119 (*)    Calcium, Ion 0.98 (*)    Hemoglobin 10.9 (*)    HCT 32.0 (*)    All other components within normal limits    EKG None  Radiology DG Ankle Complete Right  Result Date: 02/10/2020 CLINICAL DATA:  Right ankle pain EXAM: RIGHT ANKLE - COMPLETE 3+ VIEW COMPARISON:  None. FINDINGS: Oblique fracture of the lateral malleolus with overlying edema. Indistinctness of the margins of the fracture. Small ossicle along the tip of the medial malleolus could represent an avulsion fracture. I not see a well-defined posterior malleolar fracture. Tibiotalar joint effusion. Dorsal spurring of the talar head. Equivocal widening of the medial tibiotalar space. IMPRESSION: 1. Weber B fracture the ankle with oblique lateral malleolar component, possible avulsion from the medial malleolus, and questionable widening of the medial tibiotalar space between the talus and the medial malleolus. Given the possible avulsion from the medial malleolar tip, a stage 4 fracture cannot be excluded despite the lack of a visible posterior malleolar fracture. Electronically Signed   By: Van Clines M.D.   On: 02/10/2020 19:07   DG Knee Complete 4 Views Left  Result Date: 02/10/2020 CLINICAL DATA:  Knee pain EXAM: LEFT KNEE - COMPLETE 4+ VIEW COMPARISON:  None. FINDINGS: Marginal spurring in the patella along with spurring along the distal quadriceps tendon. No knee effusion. Mild marginal spurring in the lateral compartment. SFA and popliteal artery atherosclerotic vascular calcification. IMPRESSION: 1. Mild degenerative findings in the knee. 2. SFA and popliteal artery atherosclerotic vascular calcification. 3. No knee effusion or acute bony findings. Electronically Signed   By: Van Clines M.D.   On: 02/10/2020 19:04   DG Knee Complete 4 Views Right  Result Date: 02/10/2020 CLINICAL DATA:  Knee pain EXAM: RIGHT KNEE - COMPLETE 4+ VIEW COMPARISON:  None. FINDINGS: Tricompartmental  spurring. Small knee effusion in the suprapatellar bursa. Spurring along the distal quadriceps and proximal patellar tendon. Popliteal atherosclerotic calcification. Mild medial compartmental narrowing and mild marginal spurring in the medial and lateral compartments. IMPRESSION: 1. Tricompartmental spurring with mild medial compartmental narrowing. 2. Small knee effusion. 3. Spurring along the distal quadriceps and proximal patellar tendon. Electronically Signed   By: Van Clines M.D.   On: 02/10/2020 19:09    Procedures Procedures (including critical care time)  Medications Ordered in ED Medications - No data to display  ED Course  I have reviewed the triage vital signs and the nursing notes.  Pertinent labs & imaging results that were available during my care of the patient were reviewed by me and considered in my medical decision making (see chart for details).  Clinical Course as of Feb 09 1957  Wed Feb 10, 2020  1945 DG Ankle Complete Right [EW]  1947 This time he is getting orthostatic vital signs done.  They are positive, for orthostasis with standing however the patient was not symptomatic with dizziness or weakness.  He states that at this time he feels like he usually does after dialysis.  He feels comfortable going home.  He was able to bear weight on the right foot, despite having a fibula fracture.   [EW]    Clinical Course  User Index [EW] Daleen Bo, MD   MDM Rules/Calculators/A&P                           Patient Vitals for the past 24 hrs:  BP Temp Temp src Pulse Resp SpO2  02/10/20 1914 106/75 -- -- -- 17 --  02/10/20 1859 100/75 -- -- -- 19 --  02/10/20 1816 (!) 89/56 -- -- 86 12 99 %  02/10/20 1801 104/69 -- -- -- 14 --  02/10/20 1800 104/69 -- -- -- 20 --  02/10/20 1746 (!) 88/69 -- -- 87 18 100 %  02/10/20 1732 104/77 -- -- -- 16 --  02/10/20 1717 (!) 97/58 -- -- 91 17 100 %  02/10/20 1714 93/63 98.1 F (36.7 C) Oral 93 14 98 %    7:58 PM  Reevaluation with update and discussion. After initial assessment and treatment, an updated evaluation reveals he is fairly comfortable has no additional complaints.  Blood pressure trend has improved when supine however still drops when standing.  I reviewed the patient's images directly with him, x-rays done at his orthopedist office today, are not in the EMR.  Patient states the appearance of the distal fibula fracture looks very familiar to him, "like it did today."  He tolerated standing easily.  Patient has a orthopedic boot that he can wear in his right ankle.  Findings discussed with the patient, and his wife at the bedside, and all questions were answered. Daleen Bo   Medical Decision Making:  This patient is presenting for evaluation of fall with weakness and orthostasis., which does require a range of treatment options, and is a complaint that involves a moderate risk of morbidity and mortality. The differential diagnoses include iron depletion, worsening renal failure, traumatic injury. I decided to review old records, and in summary patient with end-stage renal disease, with near syncope after dialysis today.  Clinically well without recent illnesses..  I did not require normal potassium, chloride low, creatinine high, glucose high, calcium low, hemoglobin low additional historical information from anyone.  Clinical Laboratory Tests Ordered, included I-STAT 8. Review indicates stable parameters of end-stage renal disease. Radiologic Tests Ordered, included plain images of both knees and right ankle.  I independently Visualized: Radiographic images, which show arthritis in knees, healing distal fibula fracture  Cardiac Monitor Tracing which shows normal sinus rhythm    Critical Interventions-clinical evaluation, laboratory testing, radiographic imaging, observation reassessment  After These Interventions, the Patient was reevaluated and was found stable for discharge.  Fall with  near syncope and hypotension, following dialysis.  No overt hemodynamic instability, improved with treatment in the ED.  Fall with aggravation of right ankle injury which appears to be healing distal fibula fracture.  Bilateral knee arthritis without fracture, on imaging.  CRITICAL CARE-no Performed by: Daleen Bo  Nursing Notes Reviewed/ Care Coordinated Applicable Imaging Reviewed Interpretation of Laboratory Data incorporated into ED treatment  The patient appears reasonably screened and/or stabilized for discharge and I doubt any other medical condition or other [facility] requiring further screening, evaluation, or treatment in the ED at this time prior to discharge.  Plan: Home Medications-usual; Home Treatments-orthopedic boot right ankle; return here if the recommended treatment, does not improve the symptoms; Recommended follow up-PCP and orthopedic follow-up as needed.     Final Clinical Impression(s) / ED Diagnoses Final diagnoses:  None    Rx / DC Orders ED Discharge Orders    None  Daleen Bo, MD 02/10/20 2103

## 2020-02-10 NOTE — Discharge Instructions (Addendum)
Be careful when standing and walking until you start to feel less dizzy.  Wear your orthopedic boot, on the right side until your pain and swelling improved.  See your doctors as needed for problems.

## 2020-02-10 NOTE — ED Triage Notes (Signed)
Pt here from home via EMS. Pt had full dialysis tx today, felt dizzy after the tx, stumbled and fell when trying to get back in the house. Pt hypotensive, as low as 18F systolic. Multiple recent falls with recent injury to R ankle, although c/o new pain to same today as well as bilateral leg pain behind knees. Skin tear to shin. 125 cc bolus given by EMS with improvement to 92/63 bp.

## 2020-02-18 ENCOUNTER — Encounter: Payer: Self-pay | Admitting: Physical Medicine & Rehabilitation

## 2020-02-18 ENCOUNTER — Other Ambulatory Visit: Payer: Self-pay

## 2020-02-18 ENCOUNTER — Encounter: Payer: Medicare Other | Attending: Physical Medicine & Rehabilitation | Admitting: Physical Medicine & Rehabilitation

## 2020-02-18 VITALS — BP 110/71 | HR 94 | Temp 97.7°F | Ht 77.0 in | Wt 305.0 lb

## 2020-02-18 DIAGNOSIS — E0842 Diabetes mellitus due to underlying condition with diabetic polyneuropathy: Secondary | ICD-10-CM | POA: Insufficient documentation

## 2020-02-18 DIAGNOSIS — G6289 Other specified polyneuropathies: Secondary | ICD-10-CM | POA: Insufficient documentation

## 2020-02-18 DIAGNOSIS — G479 Sleep disorder, unspecified: Secondary | ICD-10-CM | POA: Insufficient documentation

## 2020-02-18 DIAGNOSIS — R269 Unspecified abnormalities of gait and mobility: Secondary | ICD-10-CM | POA: Diagnosis present

## 2020-02-18 DIAGNOSIS — N186 End stage renal disease: Secondary | ICD-10-CM

## 2020-02-18 DIAGNOSIS — Z992 Dependence on renal dialysis: Secondary | ICD-10-CM | POA: Insufficient documentation

## 2020-02-18 MED ORDER — AMITRIPTYLINE HCL 10 MG PO TABS: 10.0000 mg | ORAL_TABLET | Freq: Every day | ORAL | 1 refills | Status: DC

## 2020-02-18 NOTE — Progress Notes (Addendum)
Subjective:    Patient ID: [name], male    DOB: February 06, 1961, 59 y.o.   MRN: [id number]  HPI Male with pmh of heart transplant, ESRD, prostate CA, OSA, obesity, CHF, GERD, DM, CVA with residual left hand weakness, presents with peripheral neuropathy.  Initially stated: B/l knees distally and b/l hands. Started after transplant and dialysis in ~2015.  Getting progressively worse.  Worse prior to bed.  HD exacerbates the pain.  Off HD days better. Pins/needles/tingling.  Associated numbness. Lyrica helps slightly.  Denies falls. Pain limits ambulation.   Last clinic visit on 01/21/2020.  Since that time, patient went to the ED after a fall postdialysis-notes reviewed, repeated falls, improved symptoms in ED. He denies falls since that time. He continues Lidocaine, Epson salt.  He still has not received call regarding Estim, but has not followed up.  He is ambulating more now that he is out of his boot. He fell on his knees and so continues to require cane due to knee pain. Sleep is fair.   Pain Inventory Average Pain 9 Pain Right Now 6 My pain is burning, stabbing, tingling and aching  In the last 24 hours, has pain interfered with the following? General activity 4 Relation with others 3 Enjoyment of life 6 What TIME of day is your pain at its worst? all the time. Sleep (in general) Poor  Pain is worse with: walking, bending, standing, unsure and some activites Pain improves with: medication Relief from Meds: 6  Mobility walk without assistance use a cane how many minutes can you walk? 30 mins. ability to climb steps?  yes do you drive?  yes Do you have any goals in this area?  yes  Function retired  Neuro/Psych weakness tingling  Prior Studies Any changes since last visit?  yes Ankle studies.  Physicians involved in your care Any changes since last visit?  yes [facility].   Family History  Problem Relation Age of Onset  . Coronary artery disease Father         had, PTCA & CABG  . Heart attack Father   . Hypertension Father   . Diabetes Father   . Prostate cancer Father   . Coronary artery disease Mother   . Heart attack Mother   . Hypertension Mother   . Stroke Mother   . Lupus Mother   . Prostate cancer Brother   . Coronary artery disease Brother   . Coronary artery disease Sister   . Heart attack Sister   . Prostate cancer Paternal Uncle   . Coronary artery disease Maternal Grandmother   . Coronary artery disease Maternal Grandfather   . Coronary artery disease Paternal Grandmother   . Coronary artery disease Paternal Grandfather    Social History   Socioeconomic History  . Marital status: Married    Spouse name: Nicki Reaper  . Number of children: 1  . Years of education: 44  . Highest education level: Not on file  Occupational History  . Occupation: retired    Fish farm manager: [place]  Tobacco Use  . Smoking status: Never Smoker  . Smokeless tobacco: Former Systems developer    Types: Snuff  Vaping Use  . Vaping Use: Never used  Substance and Sexual Activity  . Alcohol use: No  . Drug use: No  . Sexual activity: Not Currently  Other Topics Concern  . Not on file  Social History Narrative   Lives with wife in a one story home.  Has one  child.     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    Education: college.    Social Determinants of Health   Financial Resource Strain:   . Difficulty of Paying Living Expenses:   Food Insecurity:   . Worried About Charity fundraiser in the Last Year:   . Arboriculturist in the Last Year:   Transportation Needs:   . Film/video editor (Medical):   Marland Kitchen Lack of Transportation (Non-Medical):   Physical Activity:   . Days of Exercise per Week:   . Minutes of Exercise per Session:   Stress:   . Feeling of Stress :   Social Connections:   . Frequency of Communication with Friends and Family:   . Frequency of Social Gatherings with Friends and Family:   .  Attends Religious Services:   . Active Member of Clubs or Organizations:   . Attends Archivist Meetings:   Marland Kitchen Marital Status:    Past Surgical History:  Procedure Laterality Date  . CARDIAC PACEMAKER PLACEMENT  09/21/2009   Biventricular implantable cardioverter-defibrillator implantation     . COLONOSCOPY W/ BIOPSIES AND POLYPECTOMY    . FOOT SURGERY     left  . HEART TRANSPLANT  2014  . LAPAROSCOPIC GASTRIC BANDING  01/27/2007  . LEFT VENTRICULAR ASSIST DEVICE     implant[facility] at [facility]  . PROSTATE BIOPSY    . TOTAL HIP ARTHROPLASTY Right 07/22/2017   Procedure: RIGHT TOTAL HIP ARTHROPLASTY ANTERIOR APPROACH;  Surgeon: Rod Can, MD;  Location: [facility];  Service: Orthopedics;  Laterality: Right;  Needs RNFA  . TOTAL KNEE ARTHROPLASTY Right 07/22/2017   Past Medical History:  Diagnosis Date  . Arthritis   . Atrial fibrillation ([place])   . CHF (congestive heart failure), NYHA class III (HCC)    s/p heart transplant  . Chronic kidney disease    end stage  . Chronic systolic dysfunction of left ventricle   . CVA (cerebral infarction)   . Diabetes mellitus    PMH; Prior to heart transplant  . Family history of coronary artery disease    in both parents  . GERD (gastroesophageal reflux disease)   . Hyperlipidemia   . Hypertension   . Left bundle branch block   . Morbid obesity ([place])    status post lap band  . Myocardial infarction [place])    prior to heart transplant  . Nonischemic cardiomyopathy ([place])    prior to heart transplant  . Obesity (BMI 30-39.9)   . Obstructive sleep apnea    no longer needs CPAP after heart transplant per pt  . Premature ventricular contractions   . Prostate cancer ([place])   . SOB (shortness of breath)    BP 110/71   Pulse 94   Temp 97.7 F (36.5 C)   Ht 6\' 5"  (1.956 m)   Wt (!) 305 lb (138.3 kg)   SpO2 98%   BMI 36.17 kg/m   Opioid Risk Score:   Fall Risk Score:  `1  Depression screen PHQ 2/9  Depression screen [facility] 2/9  02/18/2020 01/21/2020 12/22/2019  Decreased Interest 0 0 0  Down, Depressed, Hopeless 0 0 0  PHQ - 2 Score 0 0 0  Altered sleeping 3 - -  Tired, decreased energy 1 - -  Change in appetite 1 - -  Feeling bad or failure about yourself  0 - -  Trouble concentrating 0 - -  Moving slowly or fidgety/restless  2 - -  Suicidal thoughts 0 - -  PHQ-9 Score 7 - -    Review of Systems  Constitutional: Negative.   HENT: Negative.   Eyes: Negative.   Respiratory: Negative.   Cardiovascular: Negative.   Gastrointestinal: Negative.   Endocrine: Negative.   Genitourinary: Negative.   Musculoskeletal: Positive for arthralgias, gait problem and myalgias.  Skin: Negative.   Allergic/Immunologic: Negative.   Neurological: Positive for weakness and numbness. Negative for dizziness and tremors.       Tingling  Hematological: Negative.   Psychiatric/Behavioral: Positive for dysphoric mood. Negative for confusion. The patient is not nervous/anxious.        Depression  All other systems reviewed and are negative.     Objective:   Physical Exam  Constitutional: NAD. Respiratory: Normal effort.   Psych: Normal mood.  Normal behavior. Musc: No edema in extremities.  No tenderness in extremities. Gait: Mildly antalgic with cane Neuro: Alert Tremors, unchanged Mild atrophy in hands Motor: B/l LE 4+/5 Sensation diminished to light touch distal to b/l knees, unchanged    Assessment & Plan:  Male with pmh of heart transplant, ESRD, prostate CA, OSA, obesity, CHF, GERD, DM, CVA with residual left hand weakness, presents with peripheral neuropathy.   1. Polyneuropathy - multifactorial related to DM, HD In distal extremities, worse with ambulation Cont Heat  Continue Lidocaine ointment/patch  Continue Epson salt baths Referredfor ESTIM, pt states he never received call back, reminded follow up again x3  Continue Lyrica to 75 daily with increase in additional to  125mg  post HD ContinueCymbalta, Celexa d/ced Will consider Robaxin  Will consider Referral to Psychology Patient states main goal is to ambulate more, which he is doing  Will consider Capsicin, baths  PNS implanted on 12/22/19, however, pt states he wants the lead removed. After break-up of tissue around each site, with gentle traction and superficial skin compression, PNS lead slowly removed on 01/21/20 with lead intact. Patient tolerated procedure well. No complications.  2. Gait abnormality  Continue cane again  3. Sleep disturbance See #1  Will start Elavil 10qhs Will consider Elavil, will hold off on present. Educated on sign/symptoms of serotonin symptoms   D/c trazodone No longer requires CPAP  4. Morbid Obesity Cont follow up with dietitian at HD Slowly losing weight, continueto encourage  Weights steady  Encouraged weight loss again Contributing to pain  5. Right tibia fracture  Continue to follow up with Ortho

## 2020-02-25 DIAGNOSIS — T8620 Unspecified complication of heart transplant: Secondary | ICD-10-CM | POA: Diagnosis not present

## 2020-02-25 DIAGNOSIS — N186 End stage renal disease: Secondary | ICD-10-CM | POA: Diagnosis not present

## 2020-02-25 DIAGNOSIS — Z992 Dependence on renal dialysis: Secondary | ICD-10-CM | POA: Diagnosis not present

## 2020-02-26 DIAGNOSIS — Z992 Dependence on renal dialysis: Secondary | ICD-10-CM | POA: Diagnosis not present

## 2020-02-26 DIAGNOSIS — N186 End stage renal disease: Secondary | ICD-10-CM | POA: Diagnosis not present

## 2020-02-26 DIAGNOSIS — E1129 Type 2 diabetes mellitus with other diabetic kidney complication: Secondary | ICD-10-CM | POA: Diagnosis not present

## 2020-02-26 DIAGNOSIS — D509 Iron deficiency anemia, unspecified: Secondary | ICD-10-CM | POA: Diagnosis not present

## 2020-02-26 DIAGNOSIS — D631 Anemia in chronic kidney disease: Secondary | ICD-10-CM | POA: Diagnosis not present

## 2020-02-26 DIAGNOSIS — N2581 Secondary hyperparathyroidism of renal origin: Secondary | ICD-10-CM | POA: Diagnosis not present

## 2020-02-29 DIAGNOSIS — Z992 Dependence on renal dialysis: Secondary | ICD-10-CM | POA: Diagnosis not present

## 2020-02-29 DIAGNOSIS — N2581 Secondary hyperparathyroidism of renal origin: Secondary | ICD-10-CM | POA: Diagnosis not present

## 2020-02-29 DIAGNOSIS — N186 End stage renal disease: Secondary | ICD-10-CM | POA: Diagnosis not present

## 2020-02-29 DIAGNOSIS — E1129 Type 2 diabetes mellitus with other diabetic kidney complication: Secondary | ICD-10-CM | POA: Diagnosis not present

## 2020-02-29 DIAGNOSIS — D631 Anemia in chronic kidney disease: Secondary | ICD-10-CM | POA: Diagnosis not present

## 2020-02-29 DIAGNOSIS — D509 Iron deficiency anemia, unspecified: Secondary | ICD-10-CM | POA: Diagnosis not present

## 2020-03-02 DIAGNOSIS — N2581 Secondary hyperparathyroidism of renal origin: Secondary | ICD-10-CM | POA: Diagnosis not present

## 2020-03-02 DIAGNOSIS — N186 End stage renal disease: Secondary | ICD-10-CM | POA: Diagnosis not present

## 2020-03-02 DIAGNOSIS — D509 Iron deficiency anemia, unspecified: Secondary | ICD-10-CM | POA: Diagnosis not present

## 2020-03-02 DIAGNOSIS — E1129 Type 2 diabetes mellitus with other diabetic kidney complication: Secondary | ICD-10-CM | POA: Diagnosis not present

## 2020-03-02 DIAGNOSIS — Z992 Dependence on renal dialysis: Secondary | ICD-10-CM | POA: Diagnosis not present

## 2020-03-02 DIAGNOSIS — D631 Anemia in chronic kidney disease: Secondary | ICD-10-CM | POA: Diagnosis not present

## 2020-03-04 DIAGNOSIS — D631 Anemia in chronic kidney disease: Secondary | ICD-10-CM | POA: Diagnosis not present

## 2020-03-04 DIAGNOSIS — N186 End stage renal disease: Secondary | ICD-10-CM | POA: Diagnosis not present

## 2020-03-04 DIAGNOSIS — D509 Iron deficiency anemia, unspecified: Secondary | ICD-10-CM | POA: Diagnosis not present

## 2020-03-04 DIAGNOSIS — Z992 Dependence on renal dialysis: Secondary | ICD-10-CM | POA: Diagnosis not present

## 2020-03-04 DIAGNOSIS — E1129 Type 2 diabetes mellitus with other diabetic kidney complication: Secondary | ICD-10-CM | POA: Diagnosis not present

## 2020-03-04 DIAGNOSIS — N2581 Secondary hyperparathyroidism of renal origin: Secondary | ICD-10-CM | POA: Diagnosis not present

## 2020-03-07 DIAGNOSIS — Z992 Dependence on renal dialysis: Secondary | ICD-10-CM | POA: Diagnosis not present

## 2020-03-07 DIAGNOSIS — E1129 Type 2 diabetes mellitus with other diabetic kidney complication: Secondary | ICD-10-CM | POA: Diagnosis not present

## 2020-03-07 DIAGNOSIS — D509 Iron deficiency anemia, unspecified: Secondary | ICD-10-CM | POA: Diagnosis not present

## 2020-03-07 DIAGNOSIS — N186 End stage renal disease: Secondary | ICD-10-CM | POA: Diagnosis not present

## 2020-03-07 DIAGNOSIS — N2581 Secondary hyperparathyroidism of renal origin: Secondary | ICD-10-CM | POA: Diagnosis not present

## 2020-03-07 DIAGNOSIS — D631 Anemia in chronic kidney disease: Secondary | ICD-10-CM | POA: Diagnosis not present

## 2020-03-09 DIAGNOSIS — E1129 Type 2 diabetes mellitus with other diabetic kidney complication: Secondary | ICD-10-CM | POA: Diagnosis not present

## 2020-03-09 DIAGNOSIS — N186 End stage renal disease: Secondary | ICD-10-CM | POA: Diagnosis not present

## 2020-03-09 DIAGNOSIS — D509 Iron deficiency anemia, unspecified: Secondary | ICD-10-CM | POA: Diagnosis not present

## 2020-03-09 DIAGNOSIS — Z992 Dependence on renal dialysis: Secondary | ICD-10-CM | POA: Diagnosis not present

## 2020-03-09 DIAGNOSIS — D631 Anemia in chronic kidney disease: Secondary | ICD-10-CM | POA: Diagnosis not present

## 2020-03-09 DIAGNOSIS — N2581 Secondary hyperparathyroidism of renal origin: Secondary | ICD-10-CM | POA: Diagnosis not present

## 2020-03-11 DIAGNOSIS — D631 Anemia in chronic kidney disease: Secondary | ICD-10-CM | POA: Diagnosis not present

## 2020-03-11 DIAGNOSIS — D509 Iron deficiency anemia, unspecified: Secondary | ICD-10-CM | POA: Diagnosis not present

## 2020-03-11 DIAGNOSIS — N186 End stage renal disease: Secondary | ICD-10-CM | POA: Diagnosis not present

## 2020-03-11 DIAGNOSIS — N2581 Secondary hyperparathyroidism of renal origin: Secondary | ICD-10-CM | POA: Diagnosis not present

## 2020-03-11 DIAGNOSIS — E1129 Type 2 diabetes mellitus with other diabetic kidney complication: Secondary | ICD-10-CM | POA: Diagnosis not present

## 2020-03-11 DIAGNOSIS — Z992 Dependence on renal dialysis: Secondary | ICD-10-CM | POA: Diagnosis not present

## 2020-03-14 DIAGNOSIS — N186 End stage renal disease: Secondary | ICD-10-CM | POA: Diagnosis not present

## 2020-03-14 DIAGNOSIS — D631 Anemia in chronic kidney disease: Secondary | ICD-10-CM | POA: Diagnosis not present

## 2020-03-14 DIAGNOSIS — N2581 Secondary hyperparathyroidism of renal origin: Secondary | ICD-10-CM | POA: Diagnosis not present

## 2020-03-14 DIAGNOSIS — D509 Iron deficiency anemia, unspecified: Secondary | ICD-10-CM | POA: Diagnosis not present

## 2020-03-14 DIAGNOSIS — E1129 Type 2 diabetes mellitus with other diabetic kidney complication: Secondary | ICD-10-CM | POA: Diagnosis not present

## 2020-03-14 DIAGNOSIS — Z992 Dependence on renal dialysis: Secondary | ICD-10-CM | POA: Diagnosis not present

## 2020-03-16 DIAGNOSIS — D631 Anemia in chronic kidney disease: Secondary | ICD-10-CM | POA: Diagnosis not present

## 2020-03-16 DIAGNOSIS — D509 Iron deficiency anemia, unspecified: Secondary | ICD-10-CM | POA: Diagnosis not present

## 2020-03-16 DIAGNOSIS — N186 End stage renal disease: Secondary | ICD-10-CM | POA: Diagnosis not present

## 2020-03-16 DIAGNOSIS — N2581 Secondary hyperparathyroidism of renal origin: Secondary | ICD-10-CM | POA: Diagnosis not present

## 2020-03-16 DIAGNOSIS — Z992 Dependence on renal dialysis: Secondary | ICD-10-CM | POA: Diagnosis not present

## 2020-03-16 DIAGNOSIS — E1129 Type 2 diabetes mellitus with other diabetic kidney complication: Secondary | ICD-10-CM | POA: Diagnosis not present

## 2020-03-18 DIAGNOSIS — N186 End stage renal disease: Secondary | ICD-10-CM | POA: Diagnosis not present

## 2020-03-18 DIAGNOSIS — Z992 Dependence on renal dialysis: Secondary | ICD-10-CM | POA: Diagnosis not present

## 2020-03-18 DIAGNOSIS — N2581 Secondary hyperparathyroidism of renal origin: Secondary | ICD-10-CM | POA: Diagnosis not present

## 2020-03-18 DIAGNOSIS — E1129 Type 2 diabetes mellitus with other diabetic kidney complication: Secondary | ICD-10-CM | POA: Diagnosis not present

## 2020-03-18 DIAGNOSIS — D631 Anemia in chronic kidney disease: Secondary | ICD-10-CM | POA: Diagnosis not present

## 2020-03-18 DIAGNOSIS — D509 Iron deficiency anemia, unspecified: Secondary | ICD-10-CM | POA: Diagnosis not present

## 2020-03-21 DIAGNOSIS — E1129 Type 2 diabetes mellitus with other diabetic kidney complication: Secondary | ICD-10-CM | POA: Diagnosis not present

## 2020-03-21 DIAGNOSIS — D631 Anemia in chronic kidney disease: Secondary | ICD-10-CM | POA: Diagnosis not present

## 2020-03-21 DIAGNOSIS — N2581 Secondary hyperparathyroidism of renal origin: Secondary | ICD-10-CM | POA: Diagnosis not present

## 2020-03-21 DIAGNOSIS — Z992 Dependence on renal dialysis: Secondary | ICD-10-CM | POA: Diagnosis not present

## 2020-03-21 DIAGNOSIS — D509 Iron deficiency anemia, unspecified: Secondary | ICD-10-CM | POA: Diagnosis not present

## 2020-03-21 DIAGNOSIS — N186 End stage renal disease: Secondary | ICD-10-CM | POA: Diagnosis not present

## 2020-03-23 DIAGNOSIS — D631 Anemia in chronic kidney disease: Secondary | ICD-10-CM | POA: Diagnosis not present

## 2020-03-23 DIAGNOSIS — E1129 Type 2 diabetes mellitus with other diabetic kidney complication: Secondary | ICD-10-CM | POA: Diagnosis not present

## 2020-03-23 DIAGNOSIS — Z992 Dependence on renal dialysis: Secondary | ICD-10-CM | POA: Diagnosis not present

## 2020-03-23 DIAGNOSIS — N186 End stage renal disease: Secondary | ICD-10-CM | POA: Diagnosis not present

## 2020-03-23 DIAGNOSIS — D509 Iron deficiency anemia, unspecified: Secondary | ICD-10-CM | POA: Diagnosis not present

## 2020-03-23 DIAGNOSIS — N2581 Secondary hyperparathyroidism of renal origin: Secondary | ICD-10-CM | POA: Diagnosis not present

## 2020-03-24 ENCOUNTER — Encounter: Payer: Self-pay | Admitting: Physical Medicine & Rehabilitation

## 2020-03-24 ENCOUNTER — Other Ambulatory Visit: Payer: Self-pay

## 2020-03-24 ENCOUNTER — Encounter: Payer: Medicare Other | Attending: Physical Medicine & Rehabilitation | Admitting: Physical Medicine & Rehabilitation

## 2020-03-24 VITALS — BP 122/76 | HR 97 | Temp 98.1°F | Ht 77.0 in | Wt 307.4 lb

## 2020-03-24 DIAGNOSIS — Z992 Dependence on renal dialysis: Secondary | ICD-10-CM | POA: Insufficient documentation

## 2020-03-24 DIAGNOSIS — N186 End stage renal disease: Secondary | ICD-10-CM | POA: Insufficient documentation

## 2020-03-24 DIAGNOSIS — E0842 Diabetes mellitus due to underlying condition with diabetic polyneuropathy: Secondary | ICD-10-CM

## 2020-03-24 DIAGNOSIS — G6289 Other specified polyneuropathies: Secondary | ICD-10-CM | POA: Diagnosis not present

## 2020-03-24 DIAGNOSIS — R269 Unspecified abnormalities of gait and mobility: Secondary | ICD-10-CM | POA: Insufficient documentation

## 2020-03-24 DIAGNOSIS — G479 Sleep disorder, unspecified: Secondary | ICD-10-CM | POA: Diagnosis not present

## 2020-03-24 MED ORDER — AMITRIPTYLINE HCL 25 MG PO TABS: 25.0000 mg | ORAL_TABLET | Freq: Every day | ORAL | 1 refills | Status: DC

## 2020-03-24 MED ORDER — PREGABALIN 75 MG PO CAPS: 75.0000 mg | ORAL_CAPSULE | Freq: Every day | ORAL | 1 refills | Status: DC

## 2020-03-24 MED ORDER — PREGABALIN 25 MG PO CAPS: ORAL_CAPSULE | ORAL | 1 refills | Status: DC

## 2020-03-24 NOTE — Progress Notes (Signed)
Subjective:    Patient ID: [name], male    DOB: [birth year], 58 y.o.   MRN: [id number]  HPI Male with pmh of heart transplant, ESRD, prostate CA, OSA, obesity, CHF, GERD, DM, CVA with residual left hand weakness, presents with peripheral neuropathy.  Initially stated: B/l knees distally and b/l hands. Started after transplant and dialysis in ~2015.  Getting progressively worse.  Worse prior to bed.  HD exacerbates the pain.  Off HD days better. Pins/needles/tingling.  Associated numbness. Lyrica helps slightly.  Denies falls. Pain limits ambulation.   Last clinic visit on 02/18/20.  Since that time, pt states he continues with heat, Epson salt baths, Lidocaine.  Pt states he has not received a call back regarding TENS unit and did not reach out either. He is doing well with Lyrica. Sleep is fair, does not notice benefit with Elavil. He is not using his cane as much.  Denies falls. He has not lost weight.   Pain Inventory Average Pain 7 Pain Right Now 8 My pain is sharp, burning, stabbing, tingling and aching  In the last 24 hours, has pain interfered with the following? General activity 4 Relation with others 8 Enjoyment of life 7 What TIME of day is your pain at its worst? all the time. Sleep (in general) Poor  Pain is worse with: walking, bending, sitting, inactivity and standing Pain improves with: rest, heat/ice, therapy/exercise, pacing activities and medication Relief from Meds: 6  Mobility walk without assistance use a cane how many minutes can you walk? 30 mins. ability to climb steps?  yes do you drive?  yes Do you have any goals in this area?  yes  Function retired  Neuro/Psych weakness tingling  Prior Studies Any changes since last visit?  no  Physicians involved in your care Any changes since last visit?  no   Family History  Problem Relation Age of Onset  . Coronary artery disease Father        had, PTCA & CABG  . Heart attack Father   .  Hypertension Father   . Diabetes Father   . Prostate cancer Father   . Coronary artery disease Mother   . Heart attack Mother   . Hypertension Mother   . Stroke Mother   . Lupus Mother   . Prostate cancer Brother   . Coronary artery disease Brother   . Coronary artery disease Sister   . Heart attack Sister   . Prostate cancer Paternal Uncle   . Coronary artery disease Maternal Grandmother   . Coronary artery disease Maternal Grandfather   . Coronary artery disease Paternal Grandmother   . Coronary artery disease Paternal Grandfather    Social History   Socioeconomic History  . Marital status: Married    Spouse name: Nicki Reaper  . Number of children: 1  . Years of education: 64  . Highest education level: Not on file  Occupational History  . Occupation: retired    Fish farm manager: [place]  Tobacco Use  . Smoking status: Never Smoker  . Smokeless tobacco: Former Systems developer    Types: Snuff  Vaping Use  . Vaping Use: Never used  Substance and Sexual Activity  . Alcohol use: No  . Drug use: No  . Sexual activity: Not Currently  Other Topics Concern  . Not on file  Social History Narrative   Lives with wife in a one story home.  Has one child.     Retired from the [employer]  of [place].  Supervisor over transportation.    Education: college.    Social Determinants of Health   Financial Resource Strain:   . Difficulty of Paying Living Expenses:   Food Insecurity:   . Worried About Charity fundraiser in the Last Year:   . Arboriculturist in the Last Year:   Transportation Needs:   . Film/video editor (Medical):   Marland Kitchen Lack of Transportation (Non-Medical):   Physical Activity:   . Days of Exercise per Week:   . Minutes of Exercise per Session:   Stress:   . Feeling of Stress :   Social Connections:   . Frequency of Communication with Friends and Family:   . Frequency of Social Gatherings with Friends and Family:   . Attends Religious Services:   . Active Member of  Clubs or Organizations:   . Attends Archivist Meetings:   Marland Kitchen Marital Status:    Past Surgical History:  Procedure Laterality Date  . CARDIAC PACEMAKER PLACEMENT  09/21/2009   Biventricular implantable cardioverter-defibrillator implantation     . COLONOSCOPY W/ BIOPSIES AND POLYPECTOMY    . FOOT SURGERY     left  . HEART TRANSPLANT  2014  . LAPAROSCOPIC GASTRIC BANDING  01/27/2007  . LEFT VENTRICULAR ASSIST DEVICE     implanted at [facility]  . PROSTATE BIOPSY    . TOTAL HIP ARTHROPLASTY Right 07/22/2017   Procedure: RIGHT TOTAL HIP ARTHROPLASTY ANTERIOR APPROACH;  Surgeon: Rod Can, MD;  Location: [facility];  Service: Orthopedics;  Laterality: Right;  Needs RNFA  . TOTAL KNEE ARTHROPLASTY Right 07/22/2017   Past Medical History:  Diagnosis Date  . Arthritis   . Atrial fibrillation ([place])   . CHF (congestive heart failure), NYHA class III (HCC)    s/p heart transplant  . Chronic kidney disease    end stage  . Chronic systolic dysfunction of left ventricle   . CVA (cerebral infarction)   . Diabetes mellitus    PMH; Prior to heart transplant  . Family history of coronary artery disease    in both parents  . GERD (gastroesophageal reflux disease)   . Hyperlipidemia   . Hypertension   . Left bundle branch block   . Morbid obesity ([place])    status post lap band  . Myocardial infarction [facility])    prior to heart transplant  . Nonischemic cardiomyopathy ([place])    prior to heart transplant  . Obesity (BMI 30-39.9)   . Obstructive sleep apnea    no longer needs CPAP after heart transplant per pt  . Premature ventricular contractions   . Prostate cancer ([place])   . SOB (shortness of breath)    There were no vitals taken for this visit.  Opioid Risk Score:   Fall Risk Score:  `1  Depression screen PHQ 2/9  Depression screen [facility] 2/9 02/18/2020 01/21/2020 12/22/2019  Decreased Interest 0 0 0  Down, Depressed, Hopeless 0 0 0  PHQ - 2 Score 0 0 0  Altered sleeping 3 - -    Tired, decreased energy 1 - -  Change in appetite 1 - -  Feeling bad or failure about yourself  0 - -  Trouble concentrating 0 - -  Moving slowly or fidgety/restless 2 - -  Suicidal thoughts 0 - -  PHQ-9 Score 7 - -    Review of Systems  Constitutional: Negative.   HENT: Negative.   Eyes: Negative.   Respiratory: Negative.  Cardiovascular: Negative.   Gastrointestinal: Negative.   Endocrine: Negative.   Genitourinary: Negative.   Musculoskeletal: Positive for back pain and myalgias. Negative for arthralgias and gait problem.  Skin: Negative.   Allergic/Immunologic: Negative.   Neurological: Positive for weakness and numbness. Negative for dizziness and tremors.       Tingling  Hematological: Negative.   Psychiatric/Behavioral: Positive for dysphoric mood. Negative for confusion.       Depression  All other systems reviewed and are negative.     Objective:   Physical Exam  Constitutional: NAD. Respiratory: Normal effort.   Psych: Normal mood.  Normal behavior. Musc: No edema in extremities.  No tenderness in extremities. Gait: Mildly antalgic, no assistive device Neuro: Alert Tremors, unchanged Mild atrophy in hands Motor: B/l LE 4+/5, stable Sensation diminished to light touch distal to b/l knees, unchanged    Assessment & Plan:  Male with pmh of heart transplant, ESRD, prostate CA, OSA, obesity, CHF, GERD, DM, CVA with residual left hand weakness, presents with peripheral neuropathy.   1. Polyneuropathy - multifactorial related to DM, HD In distal extremities, worse with ambulation Cont Heat  Continue Lidocaine ointment/patch  Continue Epson salt baths Referredfor ESTIM, pt states he never received call back, reminded follow up again x4  Continue Lyrica to 75 daily with additional 125mg  post HD ContinueCymbalta per PCP, Celexa d/ced Will consider Robaxin  Will consider Referral to  Psychology Patient states main goal is to ambulate more, continues to improve  Will consider Capsicin, baths  PNS implanted on 12/22/19, however, pt states he wants the lead removed. After break-up of tissue around each site, with gentle traction and superficial skin compression, PNS lead slowly removed on 01/21/20 with lead intact. Patient tolerated procedure well. No complications.  2. Gait abnormality  Continue cane, less reliant  3. Sleep disturbance See #1  Will increase Elavil to 25mg  qhs, educated on sign/symptoms of serotonin symptoms   D/c trazodone No longer requires CPAP  4. Morbid Obesity Cont follow up with dietitian at HD  No changes in weight, encouraged weight loss again Contributing to pain  5. Right tibia fracture  Continue to follow up with Ortho

## 2020-03-25 DIAGNOSIS — Z992 Dependence on renal dialysis: Secondary | ICD-10-CM | POA: Diagnosis not present

## 2020-03-25 DIAGNOSIS — N186 End stage renal disease: Secondary | ICD-10-CM | POA: Diagnosis not present

## 2020-03-25 DIAGNOSIS — E1129 Type 2 diabetes mellitus with other diabetic kidney complication: Secondary | ICD-10-CM | POA: Diagnosis not present

## 2020-03-25 DIAGNOSIS — N2581 Secondary hyperparathyroidism of renal origin: Secondary | ICD-10-CM | POA: Diagnosis not present

## 2020-03-25 DIAGNOSIS — D631 Anemia in chronic kidney disease: Secondary | ICD-10-CM | POA: Diagnosis not present

## 2020-03-25 DIAGNOSIS — D509 Iron deficiency anemia, unspecified: Secondary | ICD-10-CM | POA: Diagnosis not present

## 2020-03-27 DIAGNOSIS — Z992 Dependence on renal dialysis: Secondary | ICD-10-CM | POA: Diagnosis not present

## 2020-03-27 DIAGNOSIS — N186 End stage renal disease: Secondary | ICD-10-CM | POA: Diagnosis not present

## 2020-03-27 DIAGNOSIS — T8620 Unspecified complication of heart transplant: Secondary | ICD-10-CM | POA: Diagnosis not present

## 2020-03-28 DIAGNOSIS — N2581 Secondary hyperparathyroidism of renal origin: Secondary | ICD-10-CM | POA: Diagnosis not present

## 2020-03-28 DIAGNOSIS — D509 Iron deficiency anemia, unspecified: Secondary | ICD-10-CM | POA: Diagnosis not present

## 2020-03-28 DIAGNOSIS — Z992 Dependence on renal dialysis: Secondary | ICD-10-CM | POA: Diagnosis not present

## 2020-03-28 DIAGNOSIS — N186 End stage renal disease: Secondary | ICD-10-CM | POA: Diagnosis not present

## 2020-03-28 DIAGNOSIS — D631 Anemia in chronic kidney disease: Secondary | ICD-10-CM | POA: Diagnosis not present

## 2020-03-28 DIAGNOSIS — E1129 Type 2 diabetes mellitus with other diabetic kidney complication: Secondary | ICD-10-CM | POA: Diagnosis not present

## 2020-03-30 DIAGNOSIS — Z992 Dependence on renal dialysis: Secondary | ICD-10-CM | POA: Diagnosis not present

## 2020-03-30 DIAGNOSIS — D631 Anemia in chronic kidney disease: Secondary | ICD-10-CM | POA: Diagnosis not present

## 2020-03-30 DIAGNOSIS — N186 End stage renal disease: Secondary | ICD-10-CM | POA: Diagnosis not present

## 2020-03-30 DIAGNOSIS — N2581 Secondary hyperparathyroidism of renal origin: Secondary | ICD-10-CM | POA: Diagnosis not present

## 2020-03-30 DIAGNOSIS — E1129 Type 2 diabetes mellitus with other diabetic kidney complication: Secondary | ICD-10-CM | POA: Diagnosis not present

## 2020-03-30 DIAGNOSIS — D509 Iron deficiency anemia, unspecified: Secondary | ICD-10-CM | POA: Diagnosis not present

## 2020-04-01 DIAGNOSIS — E1129 Type 2 diabetes mellitus with other diabetic kidney complication: Secondary | ICD-10-CM | POA: Diagnosis not present

## 2020-04-01 DIAGNOSIS — D631 Anemia in chronic kidney disease: Secondary | ICD-10-CM | POA: Diagnosis not present

## 2020-04-01 DIAGNOSIS — Z992 Dependence on renal dialysis: Secondary | ICD-10-CM | POA: Diagnosis not present

## 2020-04-01 DIAGNOSIS — N2581 Secondary hyperparathyroidism of renal origin: Secondary | ICD-10-CM | POA: Diagnosis not present

## 2020-04-01 DIAGNOSIS — D509 Iron deficiency anemia, unspecified: Secondary | ICD-10-CM | POA: Diagnosis not present

## 2020-04-01 DIAGNOSIS — N186 End stage renal disease: Secondary | ICD-10-CM | POA: Diagnosis not present

## 2020-04-04 DIAGNOSIS — D631 Anemia in chronic kidney disease: Secondary | ICD-10-CM | POA: Diagnosis not present

## 2020-04-04 DIAGNOSIS — Z992 Dependence on renal dialysis: Secondary | ICD-10-CM | POA: Diagnosis not present

## 2020-04-04 DIAGNOSIS — E1129 Type 2 diabetes mellitus with other diabetic kidney complication: Secondary | ICD-10-CM | POA: Diagnosis not present

## 2020-04-04 DIAGNOSIS — N186 End stage renal disease: Secondary | ICD-10-CM | POA: Diagnosis not present

## 2020-04-04 DIAGNOSIS — D509 Iron deficiency anemia, unspecified: Secondary | ICD-10-CM | POA: Diagnosis not present

## 2020-04-04 DIAGNOSIS — N2581 Secondary hyperparathyroidism of renal origin: Secondary | ICD-10-CM | POA: Diagnosis not present

## 2020-04-06 DIAGNOSIS — N2581 Secondary hyperparathyroidism of renal origin: Secondary | ICD-10-CM | POA: Diagnosis not present

## 2020-04-06 DIAGNOSIS — Z992 Dependence on renal dialysis: Secondary | ICD-10-CM | POA: Diagnosis not present

## 2020-04-06 DIAGNOSIS — E1129 Type 2 diabetes mellitus with other diabetic kidney complication: Secondary | ICD-10-CM | POA: Diagnosis not present

## 2020-04-06 DIAGNOSIS — D509 Iron deficiency anemia, unspecified: Secondary | ICD-10-CM | POA: Diagnosis not present

## 2020-04-06 DIAGNOSIS — D631 Anemia in chronic kidney disease: Secondary | ICD-10-CM | POA: Diagnosis not present

## 2020-04-06 DIAGNOSIS — N186 End stage renal disease: Secondary | ICD-10-CM | POA: Diagnosis not present

## 2020-04-08 DIAGNOSIS — D509 Iron deficiency anemia, unspecified: Secondary | ICD-10-CM | POA: Diagnosis not present

## 2020-04-08 DIAGNOSIS — N2581 Secondary hyperparathyroidism of renal origin: Secondary | ICD-10-CM | POA: Diagnosis not present

## 2020-04-08 DIAGNOSIS — E1129 Type 2 diabetes mellitus with other diabetic kidney complication: Secondary | ICD-10-CM | POA: Diagnosis not present

## 2020-04-08 DIAGNOSIS — Z992 Dependence on renal dialysis: Secondary | ICD-10-CM | POA: Diagnosis not present

## 2020-04-08 DIAGNOSIS — N186 End stage renal disease: Secondary | ICD-10-CM | POA: Diagnosis not present

## 2020-04-08 DIAGNOSIS — D631 Anemia in chronic kidney disease: Secondary | ICD-10-CM | POA: Diagnosis not present

## 2020-04-11 DIAGNOSIS — N2581 Secondary hyperparathyroidism of renal origin: Secondary | ICD-10-CM | POA: Diagnosis not present

## 2020-04-11 DIAGNOSIS — Z992 Dependence on renal dialysis: Secondary | ICD-10-CM | POA: Diagnosis not present

## 2020-04-11 DIAGNOSIS — N186 End stage renal disease: Secondary | ICD-10-CM | POA: Diagnosis not present

## 2020-04-11 DIAGNOSIS — D631 Anemia in chronic kidney disease: Secondary | ICD-10-CM | POA: Diagnosis not present

## 2020-04-11 DIAGNOSIS — E1129 Type 2 diabetes mellitus with other diabetic kidney complication: Secondary | ICD-10-CM | POA: Diagnosis not present

## 2020-04-11 DIAGNOSIS — D509 Iron deficiency anemia, unspecified: Secondary | ICD-10-CM | POA: Diagnosis not present

## 2020-04-13 DIAGNOSIS — D631 Anemia in chronic kidney disease: Secondary | ICD-10-CM | POA: Diagnosis not present

## 2020-04-13 DIAGNOSIS — N186 End stage renal disease: Secondary | ICD-10-CM | POA: Diagnosis not present

## 2020-04-13 DIAGNOSIS — N2581 Secondary hyperparathyroidism of renal origin: Secondary | ICD-10-CM | POA: Diagnosis not present

## 2020-04-13 DIAGNOSIS — D509 Iron deficiency anemia, unspecified: Secondary | ICD-10-CM | POA: Diagnosis not present

## 2020-04-13 DIAGNOSIS — Z992 Dependence on renal dialysis: Secondary | ICD-10-CM | POA: Diagnosis not present

## 2020-04-13 DIAGNOSIS — E1129 Type 2 diabetes mellitus with other diabetic kidney complication: Secondary | ICD-10-CM | POA: Diagnosis not present

## 2020-04-15 DIAGNOSIS — N186 End stage renal disease: Secondary | ICD-10-CM | POA: Diagnosis not present

## 2020-04-15 DIAGNOSIS — D631 Anemia in chronic kidney disease: Secondary | ICD-10-CM | POA: Diagnosis not present

## 2020-04-15 DIAGNOSIS — E1129 Type 2 diabetes mellitus with other diabetic kidney complication: Secondary | ICD-10-CM | POA: Diagnosis not present

## 2020-04-15 DIAGNOSIS — D509 Iron deficiency anemia, unspecified: Secondary | ICD-10-CM | POA: Diagnosis not present

## 2020-04-15 DIAGNOSIS — N2581 Secondary hyperparathyroidism of renal origin: Secondary | ICD-10-CM | POA: Diagnosis not present

## 2020-04-15 DIAGNOSIS — Z992 Dependence on renal dialysis: Secondary | ICD-10-CM | POA: Diagnosis not present

## 2020-04-17 ENCOUNTER — Other Ambulatory Visit: Payer: Self-pay | Admitting: Physical Medicine & Rehabilitation

## 2020-04-18 DIAGNOSIS — Z992 Dependence on renal dialysis: Secondary | ICD-10-CM | POA: Diagnosis not present

## 2020-04-18 DIAGNOSIS — D509 Iron deficiency anemia, unspecified: Secondary | ICD-10-CM | POA: Diagnosis not present

## 2020-04-18 DIAGNOSIS — N2581 Secondary hyperparathyroidism of renal origin: Secondary | ICD-10-CM | POA: Diagnosis not present

## 2020-04-18 DIAGNOSIS — N186 End stage renal disease: Secondary | ICD-10-CM | POA: Diagnosis not present

## 2020-04-18 DIAGNOSIS — E1129 Type 2 diabetes mellitus with other diabetic kidney complication: Secondary | ICD-10-CM | POA: Diagnosis not present

## 2020-04-18 DIAGNOSIS — D631 Anemia in chronic kidney disease: Secondary | ICD-10-CM | POA: Diagnosis not present

## 2020-04-20 DIAGNOSIS — E1129 Type 2 diabetes mellitus with other diabetic kidney complication: Secondary | ICD-10-CM | POA: Diagnosis not present

## 2020-04-20 DIAGNOSIS — D631 Anemia in chronic kidney disease: Secondary | ICD-10-CM | POA: Diagnosis not present

## 2020-04-20 DIAGNOSIS — Z992 Dependence on renal dialysis: Secondary | ICD-10-CM | POA: Diagnosis not present

## 2020-04-20 DIAGNOSIS — N2581 Secondary hyperparathyroidism of renal origin: Secondary | ICD-10-CM | POA: Diagnosis not present

## 2020-04-20 DIAGNOSIS — D509 Iron deficiency anemia, unspecified: Secondary | ICD-10-CM | POA: Diagnosis not present

## 2020-04-20 DIAGNOSIS — N186 End stage renal disease: Secondary | ICD-10-CM | POA: Diagnosis not present

## 2020-04-22 DIAGNOSIS — D631 Anemia in chronic kidney disease: Secondary | ICD-10-CM | POA: Diagnosis not present

## 2020-04-22 DIAGNOSIS — N2581 Secondary hyperparathyroidism of renal origin: Secondary | ICD-10-CM | POA: Diagnosis not present

## 2020-04-22 DIAGNOSIS — D509 Iron deficiency anemia, unspecified: Secondary | ICD-10-CM | POA: Diagnosis not present

## 2020-04-22 DIAGNOSIS — Z992 Dependence on renal dialysis: Secondary | ICD-10-CM | POA: Diagnosis not present

## 2020-04-22 DIAGNOSIS — E1129 Type 2 diabetes mellitus with other diabetic kidney complication: Secondary | ICD-10-CM | POA: Diagnosis not present

## 2020-04-22 DIAGNOSIS — N186 End stage renal disease: Secondary | ICD-10-CM | POA: Diagnosis not present

## 2020-04-25 DIAGNOSIS — N2581 Secondary hyperparathyroidism of renal origin: Secondary | ICD-10-CM | POA: Diagnosis not present

## 2020-04-25 DIAGNOSIS — D631 Anemia in chronic kidney disease: Secondary | ICD-10-CM | POA: Diagnosis not present

## 2020-04-25 DIAGNOSIS — E1129 Type 2 diabetes mellitus with other diabetic kidney complication: Secondary | ICD-10-CM | POA: Diagnosis not present

## 2020-04-25 DIAGNOSIS — N186 End stage renal disease: Secondary | ICD-10-CM | POA: Diagnosis not present

## 2020-04-25 DIAGNOSIS — Z992 Dependence on renal dialysis: Secondary | ICD-10-CM | POA: Diagnosis not present

## 2020-04-25 DIAGNOSIS — D509 Iron deficiency anemia, unspecified: Secondary | ICD-10-CM | POA: Diagnosis not present

## 2020-04-27 DIAGNOSIS — D631 Anemia in chronic kidney disease: Secondary | ICD-10-CM | POA: Diagnosis not present

## 2020-04-27 DIAGNOSIS — D509 Iron deficiency anemia, unspecified: Secondary | ICD-10-CM | POA: Diagnosis not present

## 2020-04-27 DIAGNOSIS — T8620 Unspecified complication of heart transplant: Secondary | ICD-10-CM | POA: Diagnosis not present

## 2020-04-27 DIAGNOSIS — N186 End stage renal disease: Secondary | ICD-10-CM | POA: Diagnosis not present

## 2020-04-27 DIAGNOSIS — E1129 Type 2 diabetes mellitus with other diabetic kidney complication: Secondary | ICD-10-CM | POA: Diagnosis not present

## 2020-04-27 DIAGNOSIS — Z992 Dependence on renal dialysis: Secondary | ICD-10-CM | POA: Diagnosis not present

## 2020-04-27 DIAGNOSIS — N2581 Secondary hyperparathyroidism of renal origin: Secondary | ICD-10-CM | POA: Diagnosis not present

## 2020-04-29 DIAGNOSIS — D509 Iron deficiency anemia, unspecified: Secondary | ICD-10-CM | POA: Diagnosis not present

## 2020-04-29 DIAGNOSIS — D631 Anemia in chronic kidney disease: Secondary | ICD-10-CM | POA: Diagnosis not present

## 2020-04-29 DIAGNOSIS — Z992 Dependence on renal dialysis: Secondary | ICD-10-CM | POA: Diagnosis not present

## 2020-04-29 DIAGNOSIS — N2581 Secondary hyperparathyroidism of renal origin: Secondary | ICD-10-CM | POA: Diagnosis not present

## 2020-04-29 DIAGNOSIS — N186 End stage renal disease: Secondary | ICD-10-CM | POA: Diagnosis not present

## 2020-04-29 DIAGNOSIS — E1129 Type 2 diabetes mellitus with other diabetic kidney complication: Secondary | ICD-10-CM | POA: Diagnosis not present

## 2020-05-02 DIAGNOSIS — N186 End stage renal disease: Secondary | ICD-10-CM | POA: Diagnosis not present

## 2020-05-02 DIAGNOSIS — D631 Anemia in chronic kidney disease: Secondary | ICD-10-CM | POA: Diagnosis not present

## 2020-05-02 DIAGNOSIS — Z992 Dependence on renal dialysis: Secondary | ICD-10-CM | POA: Diagnosis not present

## 2020-05-02 DIAGNOSIS — E1129 Type 2 diabetes mellitus with other diabetic kidney complication: Secondary | ICD-10-CM | POA: Diagnosis not present

## 2020-05-02 DIAGNOSIS — N2581 Secondary hyperparathyroidism of renal origin: Secondary | ICD-10-CM | POA: Diagnosis not present

## 2020-05-02 DIAGNOSIS — D509 Iron deficiency anemia, unspecified: Secondary | ICD-10-CM | POA: Diagnosis not present

## 2020-05-03 DIAGNOSIS — Z923 Personal history of irradiation: Secondary | ICD-10-CM | POA: Diagnosis not present

## 2020-05-03 DIAGNOSIS — E785 Hyperlipidemia, unspecified: Secondary | ICD-10-CM | POA: Diagnosis not present

## 2020-05-03 DIAGNOSIS — Z4821 Encounter for aftercare following heart transplant: Secondary | ICD-10-CM | POA: Diagnosis not present

## 2020-05-03 DIAGNOSIS — Z298 Encounter for other specified prophylactic measures: Secondary | ICD-10-CM | POA: Diagnosis not present

## 2020-05-03 DIAGNOSIS — Z992 Dependence on renal dialysis: Secondary | ICD-10-CM | POA: Diagnosis not present

## 2020-05-03 DIAGNOSIS — D7582 Heparin induced thrombocytopenia (HIT): Secondary | ICD-10-CM | POA: Diagnosis not present

## 2020-05-03 DIAGNOSIS — C61 Malignant neoplasm of prostate: Secondary | ICD-10-CM | POA: Diagnosis not present

## 2020-05-03 DIAGNOSIS — G629 Polyneuropathy, unspecified: Secondary | ICD-10-CM | POA: Diagnosis not present

## 2020-05-03 DIAGNOSIS — Z7952 Long term (current) use of systemic steroids: Secondary | ICD-10-CM | POA: Diagnosis not present

## 2020-05-03 DIAGNOSIS — I371 Nonrheumatic pulmonary valve insufficiency: Secondary | ICD-10-CM | POA: Diagnosis not present

## 2020-05-03 DIAGNOSIS — Z23 Encounter for immunization: Secondary | ICD-10-CM | POA: Diagnosis not present

## 2020-05-03 DIAGNOSIS — Z7982 Long term (current) use of aspirin: Secondary | ICD-10-CM | POA: Diagnosis not present

## 2020-05-03 DIAGNOSIS — Z941 Heart transplant status: Secondary | ICD-10-CM | POA: Diagnosis not present

## 2020-05-03 DIAGNOSIS — I120 Hypertensive chronic kidney disease with stage 5 chronic kidney disease or end stage renal disease: Secondary | ICD-10-CM | POA: Diagnosis not present

## 2020-05-03 DIAGNOSIS — N186 End stage renal disease: Secondary | ICD-10-CM | POA: Diagnosis not present

## 2020-05-03 DIAGNOSIS — I951 Orthostatic hypotension: Secondary | ICD-10-CM | POA: Diagnosis not present

## 2020-05-03 DIAGNOSIS — Z79899 Other long term (current) drug therapy: Secondary | ICD-10-CM | POA: Diagnosis not present

## 2020-05-03 DIAGNOSIS — Z8546 Personal history of malignant neoplasm of prostate: Secondary | ICD-10-CM | POA: Diagnosis not present

## 2020-05-03 DIAGNOSIS — Z8673 Personal history of transient ischemic attack (TIA), and cerebral infarction without residual deficits: Secondary | ICD-10-CM | POA: Diagnosis not present

## 2020-05-03 DIAGNOSIS — Z48298 Encounter for aftercare following other organ transplant: Secondary | ICD-10-CM | POA: Diagnosis not present

## 2020-05-03 DIAGNOSIS — I340 Nonrheumatic mitral (valve) insufficiency: Secondary | ICD-10-CM | POA: Diagnosis not present

## 2020-05-04 DIAGNOSIS — D509 Iron deficiency anemia, unspecified: Secondary | ICD-10-CM | POA: Diagnosis not present

## 2020-05-04 DIAGNOSIS — E1129 Type 2 diabetes mellitus with other diabetic kidney complication: Secondary | ICD-10-CM | POA: Diagnosis not present

## 2020-05-04 DIAGNOSIS — Z992 Dependence on renal dialysis: Secondary | ICD-10-CM | POA: Diagnosis not present

## 2020-05-04 DIAGNOSIS — N2581 Secondary hyperparathyroidism of renal origin: Secondary | ICD-10-CM | POA: Diagnosis not present

## 2020-05-04 DIAGNOSIS — D631 Anemia in chronic kidney disease: Secondary | ICD-10-CM | POA: Diagnosis not present

## 2020-05-04 DIAGNOSIS — N186 End stage renal disease: Secondary | ICD-10-CM | POA: Diagnosis not present

## 2020-05-05 ENCOUNTER — Other Ambulatory Visit: Payer: Self-pay

## 2020-05-05 ENCOUNTER — Encounter: Payer: Self-pay | Admitting: *Deleted

## 2020-05-05 ENCOUNTER — Encounter: Payer: Self-pay | Admitting: Physical Medicine & Rehabilitation

## 2020-05-05 ENCOUNTER — Encounter: Payer: Medicare Other | Attending: Physical Medicine & Rehabilitation | Admitting: Physical Medicine & Rehabilitation

## 2020-05-05 VITALS — BP 122/76 | HR 97 | Temp 98.1°F | Ht 77.0 in | Wt 307.4 lb

## 2020-05-05 DIAGNOSIS — R269 Unspecified abnormalities of gait and mobility: Secondary | ICD-10-CM | POA: Insufficient documentation

## 2020-05-05 DIAGNOSIS — G479 Sleep disorder, unspecified: Secondary | ICD-10-CM | POA: Diagnosis not present

## 2020-05-05 DIAGNOSIS — G6289 Other specified polyneuropathies: Secondary | ICD-10-CM | POA: Diagnosis not present

## 2020-05-05 DIAGNOSIS — E0842 Diabetes mellitus due to underlying condition with diabetic polyneuropathy: Secondary | ICD-10-CM | POA: Insufficient documentation

## 2020-05-05 DIAGNOSIS — Z992 Dependence on renal dialysis: Secondary | ICD-10-CM | POA: Insufficient documentation

## 2020-05-05 DIAGNOSIS — N186 End stage renal disease: Secondary | ICD-10-CM | POA: Insufficient documentation

## 2020-05-05 NOTE — Progress Notes (Signed)
Subjective:    Patient ID: [name], male    DOB: 10/06/1960, 59 y.o.   MRN: [id number]  TELEHEALTH NOTE  Due to national recommendations of social distancing due to COVID 19, an audio/video telehealth visit is felt to be most appropriate for this patient at this time.  See Chart message from today for the patient's consent to telehealth from [place].     I verified that I am speaking with the correct person using two identifiers.  Location of patient: Home Location of provider: Office Method of communication: My Chart video visit Names of participants : Zorita Pang scheduling, Wenda Overland obtaining consent and vitals if available Established patient  HPI Male with pmh of heart transplant, ESRD, prostate CA, OSA, obesity, CHF, GERD, DM, CVA with residual left hand weakness, presents with peripheral neuropathy.  Initially stated: B/l knees distally and b/l hands. Started after transplant and dialysis in ~2015.  Getting progressively worse.  Worse prior to bed.  HD exacerbates the pain.  Off HD days better. Pins/needles/tingling.  Associated numbness. Lyrica helps slightly.  Denies falls. Pain limits ambulation.   Last clinic visit on 03/24/20. Since that time, pt said he had 2 syncopal episodes and had a fall.  He is following up at [facility]. He was started on midodrine and notes improvement.  He still has not received his TENS, states he did not call for follow up. He is taking Lyrica with benefit.   He states he lost 2 pounds.   Pain Inventory Average Pain 8 Pain Right Now 8 My pain is intermittent, sharp, burning, dull, stabbing, tingling and aching  In the last 24 hours, has pain interfered with the following? General activity 7 Relation with others 8 Enjoyment of life 7 What TIME of day is your pain at its worst? daytime Sleep (in general) Poor  Pain is worse with: inactivity Pain improves with: therapy/exercise and  medication Relief from Meds: 6     Family History  Problem Relation Age of Onset  . Coronary artery disease Father        had, PTCA & CABG  . Heart attack Father   . Hypertension Father   . Diabetes Father   . Prostate cancer Father   . Coronary artery disease Mother   . Heart attack Mother   . Hypertension Mother   . Stroke Mother   . Lupus Mother   . Prostate cancer Brother   . Coronary artery disease Brother   . Coronary artery disease Sister   . Heart attack Sister   . Prostate cancer Paternal Uncle   . Coronary artery disease Maternal Grandmother   . Coronary artery disease Maternal Grandfather   . Coronary artery disease Paternal Grandmother   . Coronary artery disease Paternal Grandfather    Social History   Socioeconomic History  . Marital status: Married    Spouse name: Nicki Reaper  . Number of children: 1  . Years of education: 42  . Highest education level: Not on file  Occupational History  . Occupation: retired    Fish farm manager: [place]  Tobacco Use  . Smoking status: Never Smoker  . Smokeless tobacco: Former Systems developer    Types: Snuff  Vaping Use  . Vaping Use: Never used  Substance and Sexual Activity  . Alcohol use: No  . Drug use: No  . Sexual activity: Not Currently  Other Topics Concern  . Not on file  Social History Narrative  Lives with wife in a one story home.  Has one child.     Retired from the [employer].  Supervisor over transportation.    Education: college.    Social Determinants of Health   Financial Resource Strain:   . Difficulty of Paying Living Expenses: Not on file  Food Insecurity:   . Worried About Charity fundraiser in the Last Year: Not on file  . Ran Out of Food in the Last Year: Not on file  Transportation Needs:   . Lack of Transportation (Medical): Not on file  . Lack of Transportation (Non-Medical): Not on file  Physical Activity:   . Days of Exercise per Week: Not on file  . Minutes of Exercise  per Session: Not on file  Stress:   . Feeling of Stress : Not on file  Social Connections:   . Frequency of Communication with Friends and Family: Not on file  . Frequency of Social Gatherings with Friends and Family: Not on file  . Attends Religious Services: Not on file  . Active Member of Clubs or Organizations: Not on file  . Attends Archivist Meetings: Not on file  . Marital Status: Not on file   Past Surgical History:  Procedure Laterality Date  . CARDIAC PACEMAKER PLACEMENT  09/21/2009   Biventricular implantable cardioverter-defibrillator implantation     . COLONOSCOPY W/ BIOPSIES AND POLYPECTOMY    . FOOT SURGERY     left  . HEART TRANSPLANT  2014  . LAPAROSCOPIC GASTRIC BANDING  01/27/2007  . LEFT VENTRICULAR ASSIST DEVICE     implant[facility]  . PROSTATE BIOPSY    . TOTAL HIP ARTHROPLASTY Right 07/22/2017   Procedure: RIGHT TOTAL HIP ARTHROPLASTY ANTERIOR APPROACH;  Surgeon: Rod Can, MD;  Location: [place];  Service: Orthopedics;  Laterality: Right;  Needs RNFA  . TOTAL KNEE ARTHROPLASTY Right 07/22/2017   Past Medical History:  Diagnosis Date  . Arthritis   . Atrial fibrillation ([place])   . CHF (congestive heart failure), NYHA class III (HCC)    s/p heart transplant  . Chronic kidney disease    end stage  . Chronic systolic dysfunction of left ventricle   . CVA (cerebral infarction)   . Diabetes mellitus    PMH; Prior to heart transplant  . Family history of coronary artery disease    in both parents  . GERD (gastroesophageal reflux disease)   . Hyperlipidemia   . Hypertension   . Left bundle branch block   . Morbid obesity ([place])    status post lap band  . Myocardial infarction [facility])    prior to heart transplant  . Nonischemic cardiomyopathy ([place])    prior to heart transplant  . Obesity (BMI 30-39.9)   . Obstructive sleep apnea    no longer needs CPAP after heart transplant per pt  . Premature ventricular contractions   . Prostate  cancer ([place])   . SOB (shortness of breath)    BP 122/76 Comment: last recorded/ video visit  Pulse 97 Comment: last recorded/ video visit  Temp 98.1 F (36.7 C) Comment: last recorded/ video visit  Ht 6\' 5"  (1.956 m) Comment: last recorded/ video visit  Wt (!) 307 lb 6.4 oz (139.4 kg) Comment: last recorded/ video visit  BMI 36.45 kg/m   Opioid Risk Score:   Fall Risk Score:  `1  Depression screen PHQ 2/9  Depression screen [facility] 2/9 05/05/2020 03/24/2020 02/18/2020 01/21/2020 12/22/2019  Decreased Interest 1  1 0 0 0  Down, Depressed, Hopeless 1 1 0 0 0  PHQ - 2 Score 2 2 0 0 0  Altered sleeping - - 3 - -  Tired, decreased energy - - 1 - -  Change in appetite - - 1 - -  Feeling bad or failure about yourself  - - 0 - -  Trouble concentrating - - 0 - -  Moving slowly or fidgety/restless - - 2 - -  Suicidal thoughts - - 0 - -  PHQ-9 Score - - 7 - -    Review of Systems  Constitutional: Negative.   HENT: Negative.   Eyes: Negative.   Respiratory: Negative.   Cardiovascular: Negative.   Gastrointestinal: Negative.   Endocrine: Negative.   Genitourinary: Negative.   Musculoskeletal: Positive for back pain and myalgias. Negative for arthralgias and gait problem.  Skin: Negative.   Allergic/Immunologic: Negative.   Neurological: Positive for weakness and numbness. Negative for dizziness and tremors.       Tingling  Hematological: Negative.   Psychiatric/Behavioral: Positive for dysphoric mood. Negative for confusion.       Depression  All other systems reviewed and are negative.     Objective:   Physical Exam  Constitutional: No distress . Vital signs reviewed. HENT: Normocephalic.  Atraumatic. Eyes: EOMI. No discharge. Respiratory: Normal effort.   Psych: Normal mood.  Normal behavior. Neuro: Alert     Assessment & Plan:  Male with pmh of heart transplant, ESRD, prostate CA, OSA, obesity, CHF, GERD, DM, CVA with residual left hand weakness, presents with peripheral  neuropathy.   1. Polyneuropathy - multifactorial related to DM, HD In distal extremities, worse with ambulation Cont Heat  Continue Lidocaine ointment/patch  Continue Epson salt baths Referredfor ESTIM, pt states he never received call back, reminded follow up again x5 - number provided again  Continue Lyrica to 75 daily with additional 125mg  post HD ContinueCymbalta per PCP, Celexa d/ced Will consider Robaxin  Will consider Referral to Psychology Patient states main goal is to ambulate more, continues to improve  Will consider Capsicin, baths  PNS implanted on 12/22/19, however, pt states he wants the lead removed. After break-up of tissue around each site, with gentle traction and superficial skin compression, PNS lead slowly removed on 01/21/20 with lead intact. Patient tolerated procedure well. No complications.  2. Gait abnormality  Continue cane, less reliant  3. Sleep disturbance See #1  Continue Elavil to 25mg  qhs, educated on sign/symptoms of serotonin symptoms   D/c trazodone No longer requires CPAP  4. Morbid Obesity Cont follow up with dietitian at HD  Weight essentially stable, encouraged weight loss Contributing to pain  5. Right tibia fracture  Continue to follow up with Ortho

## 2020-05-06 DIAGNOSIS — N2581 Secondary hyperparathyroidism of renal origin: Secondary | ICD-10-CM | POA: Diagnosis not present

## 2020-05-06 DIAGNOSIS — E1129 Type 2 diabetes mellitus with other diabetic kidney complication: Secondary | ICD-10-CM | POA: Diagnosis not present

## 2020-05-06 DIAGNOSIS — D631 Anemia in chronic kidney disease: Secondary | ICD-10-CM | POA: Diagnosis not present

## 2020-05-06 DIAGNOSIS — D509 Iron deficiency anemia, unspecified: Secondary | ICD-10-CM | POA: Diagnosis not present

## 2020-05-06 DIAGNOSIS — N186 End stage renal disease: Secondary | ICD-10-CM | POA: Diagnosis not present

## 2020-05-06 DIAGNOSIS — Z992 Dependence on renal dialysis: Secondary | ICD-10-CM | POA: Diagnosis not present

## 2020-05-09 DIAGNOSIS — N186 End stage renal disease: Secondary | ICD-10-CM | POA: Diagnosis not present

## 2020-05-09 DIAGNOSIS — Z992 Dependence on renal dialysis: Secondary | ICD-10-CM | POA: Diagnosis not present

## 2020-05-09 DIAGNOSIS — E1129 Type 2 diabetes mellitus with other diabetic kidney complication: Secondary | ICD-10-CM | POA: Diagnosis not present

## 2020-05-09 DIAGNOSIS — N2581 Secondary hyperparathyroidism of renal origin: Secondary | ICD-10-CM | POA: Diagnosis not present

## 2020-05-09 DIAGNOSIS — D509 Iron deficiency anemia, unspecified: Secondary | ICD-10-CM | POA: Diagnosis not present

## 2020-05-09 DIAGNOSIS — D631 Anemia in chronic kidney disease: Secondary | ICD-10-CM | POA: Diagnosis not present

## 2020-05-11 DIAGNOSIS — N2581 Secondary hyperparathyroidism of renal origin: Secondary | ICD-10-CM | POA: Diagnosis not present

## 2020-05-11 DIAGNOSIS — N186 End stage renal disease: Secondary | ICD-10-CM | POA: Diagnosis not present

## 2020-05-11 DIAGNOSIS — Z992 Dependence on renal dialysis: Secondary | ICD-10-CM | POA: Diagnosis not present

## 2020-05-11 DIAGNOSIS — D631 Anemia in chronic kidney disease: Secondary | ICD-10-CM | POA: Diagnosis not present

## 2020-05-11 DIAGNOSIS — D509 Iron deficiency anemia, unspecified: Secondary | ICD-10-CM | POA: Diagnosis not present

## 2020-05-11 DIAGNOSIS — E1129 Type 2 diabetes mellitus with other diabetic kidney complication: Secondary | ICD-10-CM | POA: Diagnosis not present

## 2020-05-13 DIAGNOSIS — D631 Anemia in chronic kidney disease: Secondary | ICD-10-CM | POA: Diagnosis not present

## 2020-05-13 DIAGNOSIS — D509 Iron deficiency anemia, unspecified: Secondary | ICD-10-CM | POA: Diagnosis not present

## 2020-05-13 DIAGNOSIS — E1129 Type 2 diabetes mellitus with other diabetic kidney complication: Secondary | ICD-10-CM | POA: Diagnosis not present

## 2020-05-13 DIAGNOSIS — N2581 Secondary hyperparathyroidism of renal origin: Secondary | ICD-10-CM | POA: Diagnosis not present

## 2020-05-13 DIAGNOSIS — Z992 Dependence on renal dialysis: Secondary | ICD-10-CM | POA: Diagnosis not present

## 2020-05-13 DIAGNOSIS — N186 End stage renal disease: Secondary | ICD-10-CM | POA: Diagnosis not present

## 2020-05-16 DIAGNOSIS — D631 Anemia in chronic kidney disease: Secondary | ICD-10-CM | POA: Diagnosis not present

## 2020-05-16 DIAGNOSIS — N186 End stage renal disease: Secondary | ICD-10-CM | POA: Diagnosis not present

## 2020-05-16 DIAGNOSIS — Z992 Dependence on renal dialysis: Secondary | ICD-10-CM | POA: Diagnosis not present

## 2020-05-16 DIAGNOSIS — E1129 Type 2 diabetes mellitus with other diabetic kidney complication: Secondary | ICD-10-CM | POA: Diagnosis not present

## 2020-05-16 DIAGNOSIS — N2581 Secondary hyperparathyroidism of renal origin: Secondary | ICD-10-CM | POA: Diagnosis not present

## 2020-05-16 DIAGNOSIS — D509 Iron deficiency anemia, unspecified: Secondary | ICD-10-CM | POA: Diagnosis not present

## 2020-05-18 DIAGNOSIS — N2581 Secondary hyperparathyroidism of renal origin: Secondary | ICD-10-CM | POA: Diagnosis not present

## 2020-05-18 DIAGNOSIS — E1129 Type 2 diabetes mellitus with other diabetic kidney complication: Secondary | ICD-10-CM | POA: Diagnosis not present

## 2020-05-18 DIAGNOSIS — D509 Iron deficiency anemia, unspecified: Secondary | ICD-10-CM | POA: Diagnosis not present

## 2020-05-18 DIAGNOSIS — Z992 Dependence on renal dialysis: Secondary | ICD-10-CM | POA: Diagnosis not present

## 2020-05-18 DIAGNOSIS — N186 End stage renal disease: Secondary | ICD-10-CM | POA: Diagnosis not present

## 2020-05-18 DIAGNOSIS — D631 Anemia in chronic kidney disease: Secondary | ICD-10-CM | POA: Diagnosis not present

## 2020-05-20 DIAGNOSIS — N186 End stage renal disease: Secondary | ICD-10-CM | POA: Diagnosis not present

## 2020-05-20 DIAGNOSIS — D509 Iron deficiency anemia, unspecified: Secondary | ICD-10-CM | POA: Diagnosis not present

## 2020-05-20 DIAGNOSIS — E1129 Type 2 diabetes mellitus with other diabetic kidney complication: Secondary | ICD-10-CM | POA: Diagnosis not present

## 2020-05-20 DIAGNOSIS — D631 Anemia in chronic kidney disease: Secondary | ICD-10-CM | POA: Diagnosis not present

## 2020-05-20 DIAGNOSIS — N2581 Secondary hyperparathyroidism of renal origin: Secondary | ICD-10-CM | POA: Diagnosis not present

## 2020-05-20 DIAGNOSIS — Z992 Dependence on renal dialysis: Secondary | ICD-10-CM | POA: Diagnosis not present

## 2020-05-23 DIAGNOSIS — N186 End stage renal disease: Secondary | ICD-10-CM | POA: Diagnosis not present

## 2020-05-23 DIAGNOSIS — D509 Iron deficiency anemia, unspecified: Secondary | ICD-10-CM | POA: Diagnosis not present

## 2020-05-23 DIAGNOSIS — D631 Anemia in chronic kidney disease: Secondary | ICD-10-CM | POA: Diagnosis not present

## 2020-05-23 DIAGNOSIS — Z992 Dependence on renal dialysis: Secondary | ICD-10-CM | POA: Diagnosis not present

## 2020-05-23 DIAGNOSIS — N2581 Secondary hyperparathyroidism of renal origin: Secondary | ICD-10-CM | POA: Diagnosis not present

## 2020-05-23 DIAGNOSIS — E1129 Type 2 diabetes mellitus with other diabetic kidney complication: Secondary | ICD-10-CM | POA: Diagnosis not present

## 2020-05-25 DIAGNOSIS — D509 Iron deficiency anemia, unspecified: Secondary | ICD-10-CM | POA: Diagnosis not present

## 2020-05-25 DIAGNOSIS — E1129 Type 2 diabetes mellitus with other diabetic kidney complication: Secondary | ICD-10-CM | POA: Diagnosis not present

## 2020-05-25 DIAGNOSIS — N186 End stage renal disease: Secondary | ICD-10-CM | POA: Diagnosis not present

## 2020-05-25 DIAGNOSIS — N2581 Secondary hyperparathyroidism of renal origin: Secondary | ICD-10-CM | POA: Diagnosis not present

## 2020-05-25 DIAGNOSIS — D631 Anemia in chronic kidney disease: Secondary | ICD-10-CM | POA: Diagnosis not present

## 2020-05-25 DIAGNOSIS — Z992 Dependence on renal dialysis: Secondary | ICD-10-CM | POA: Diagnosis not present

## 2020-05-27 DIAGNOSIS — D509 Iron deficiency anemia, unspecified: Secondary | ICD-10-CM | POA: Diagnosis not present

## 2020-05-27 DIAGNOSIS — Z23 Encounter for immunization: Secondary | ICD-10-CM | POA: Diagnosis not present

## 2020-05-27 DIAGNOSIS — D631 Anemia in chronic kidney disease: Secondary | ICD-10-CM | POA: Diagnosis not present

## 2020-05-27 DIAGNOSIS — N2581 Secondary hyperparathyroidism of renal origin: Secondary | ICD-10-CM | POA: Diagnosis not present

## 2020-05-27 DIAGNOSIS — T782XXA Anaphylactic shock, unspecified, initial encounter: Secondary | ICD-10-CM | POA: Diagnosis not present

## 2020-05-27 DIAGNOSIS — N186 End stage renal disease: Secondary | ICD-10-CM | POA: Diagnosis not present

## 2020-05-27 DIAGNOSIS — E1129 Type 2 diabetes mellitus with other diabetic kidney complication: Secondary | ICD-10-CM | POA: Diagnosis not present

## 2020-05-27 DIAGNOSIS — Z992 Dependence on renal dialysis: Secondary | ICD-10-CM | POA: Diagnosis not present

## 2020-05-27 DIAGNOSIS — T8620 Unspecified complication of heart transplant: Secondary | ICD-10-CM | POA: Diagnosis not present

## 2020-05-30 DIAGNOSIS — D509 Iron deficiency anemia, unspecified: Secondary | ICD-10-CM | POA: Diagnosis not present

## 2020-05-30 DIAGNOSIS — D631 Anemia in chronic kidney disease: Secondary | ICD-10-CM | POA: Diagnosis not present

## 2020-05-30 DIAGNOSIS — N186 End stage renal disease: Secondary | ICD-10-CM | POA: Diagnosis not present

## 2020-05-30 DIAGNOSIS — T782XXA Anaphylactic shock, unspecified, initial encounter: Secondary | ICD-10-CM | POA: Diagnosis not present

## 2020-05-30 DIAGNOSIS — N2581 Secondary hyperparathyroidism of renal origin: Secondary | ICD-10-CM | POA: Diagnosis not present

## 2020-05-30 DIAGNOSIS — Z992 Dependence on renal dialysis: Secondary | ICD-10-CM | POA: Diagnosis not present

## 2020-06-01 DIAGNOSIS — N186 End stage renal disease: Secondary | ICD-10-CM | POA: Diagnosis not present

## 2020-06-01 DIAGNOSIS — Z992 Dependence on renal dialysis: Secondary | ICD-10-CM | POA: Diagnosis not present

## 2020-06-01 DIAGNOSIS — T782XXA Anaphylactic shock, unspecified, initial encounter: Secondary | ICD-10-CM | POA: Diagnosis not present

## 2020-06-01 DIAGNOSIS — N2581 Secondary hyperparathyroidism of renal origin: Secondary | ICD-10-CM | POA: Diagnosis not present

## 2020-06-01 DIAGNOSIS — D631 Anemia in chronic kidney disease: Secondary | ICD-10-CM | POA: Diagnosis not present

## 2020-06-01 DIAGNOSIS — D509 Iron deficiency anemia, unspecified: Secondary | ICD-10-CM | POA: Diagnosis not present

## 2020-06-03 DIAGNOSIS — D631 Anemia in chronic kidney disease: Secondary | ICD-10-CM | POA: Diagnosis not present

## 2020-06-03 DIAGNOSIS — D509 Iron deficiency anemia, unspecified: Secondary | ICD-10-CM | POA: Diagnosis not present

## 2020-06-03 DIAGNOSIS — Z992 Dependence on renal dialysis: Secondary | ICD-10-CM | POA: Diagnosis not present

## 2020-06-03 DIAGNOSIS — T782XXA Anaphylactic shock, unspecified, initial encounter: Secondary | ICD-10-CM | POA: Diagnosis not present

## 2020-06-03 DIAGNOSIS — N2581 Secondary hyperparathyroidism of renal origin: Secondary | ICD-10-CM | POA: Diagnosis not present

## 2020-06-03 DIAGNOSIS — N186 End stage renal disease: Secondary | ICD-10-CM | POA: Diagnosis not present

## 2020-06-06 DIAGNOSIS — T782XXA Anaphylactic shock, unspecified, initial encounter: Secondary | ICD-10-CM | POA: Diagnosis not present

## 2020-06-06 DIAGNOSIS — D509 Iron deficiency anemia, unspecified: Secondary | ICD-10-CM | POA: Diagnosis not present

## 2020-06-06 DIAGNOSIS — E1129 Type 2 diabetes mellitus with other diabetic kidney complication: Secondary | ICD-10-CM | POA: Diagnosis not present

## 2020-06-06 DIAGNOSIS — D631 Anemia in chronic kidney disease: Secondary | ICD-10-CM | POA: Diagnosis not present

## 2020-06-06 DIAGNOSIS — N2581 Secondary hyperparathyroidism of renal origin: Secondary | ICD-10-CM | POA: Diagnosis not present

## 2020-06-06 DIAGNOSIS — Z992 Dependence on renal dialysis: Secondary | ICD-10-CM | POA: Diagnosis not present

## 2020-06-06 DIAGNOSIS — N186 End stage renal disease: Secondary | ICD-10-CM | POA: Diagnosis not present

## 2020-06-08 DIAGNOSIS — T782XXA Anaphylactic shock, unspecified, initial encounter: Secondary | ICD-10-CM | POA: Diagnosis not present

## 2020-06-08 DIAGNOSIS — N2581 Secondary hyperparathyroidism of renal origin: Secondary | ICD-10-CM | POA: Diagnosis not present

## 2020-06-08 DIAGNOSIS — D631 Anemia in chronic kidney disease: Secondary | ICD-10-CM | POA: Diagnosis not present

## 2020-06-08 DIAGNOSIS — D509 Iron deficiency anemia, unspecified: Secondary | ICD-10-CM | POA: Diagnosis not present

## 2020-06-08 DIAGNOSIS — Z992 Dependence on renal dialysis: Secondary | ICD-10-CM | POA: Diagnosis not present

## 2020-06-08 DIAGNOSIS — N186 End stage renal disease: Secondary | ICD-10-CM | POA: Diagnosis not present

## 2020-06-09 ENCOUNTER — Other Ambulatory Visit: Payer: Self-pay | Admitting: Physical Medicine & Rehabilitation

## 2020-06-10 DIAGNOSIS — D509 Iron deficiency anemia, unspecified: Secondary | ICD-10-CM | POA: Diagnosis not present

## 2020-06-10 DIAGNOSIS — N2581 Secondary hyperparathyroidism of renal origin: Secondary | ICD-10-CM | POA: Diagnosis not present

## 2020-06-10 DIAGNOSIS — Z992 Dependence on renal dialysis: Secondary | ICD-10-CM | POA: Diagnosis not present

## 2020-06-10 DIAGNOSIS — T782XXA Anaphylactic shock, unspecified, initial encounter: Secondary | ICD-10-CM | POA: Diagnosis not present

## 2020-06-10 DIAGNOSIS — N186 End stage renal disease: Secondary | ICD-10-CM | POA: Diagnosis not present

## 2020-06-10 DIAGNOSIS — D631 Anemia in chronic kidney disease: Secondary | ICD-10-CM | POA: Diagnosis not present

## 2020-06-13 DIAGNOSIS — Z992 Dependence on renal dialysis: Secondary | ICD-10-CM | POA: Diagnosis not present

## 2020-06-13 DIAGNOSIS — D631 Anemia in chronic kidney disease: Secondary | ICD-10-CM | POA: Diagnosis not present

## 2020-06-13 DIAGNOSIS — N2581 Secondary hyperparathyroidism of renal origin: Secondary | ICD-10-CM | POA: Diagnosis not present

## 2020-06-13 DIAGNOSIS — D509 Iron deficiency anemia, unspecified: Secondary | ICD-10-CM | POA: Diagnosis not present

## 2020-06-13 DIAGNOSIS — N186 End stage renal disease: Secondary | ICD-10-CM | POA: Diagnosis not present

## 2020-06-13 DIAGNOSIS — T782XXA Anaphylactic shock, unspecified, initial encounter: Secondary | ICD-10-CM | POA: Diagnosis not present

## 2020-06-15 DIAGNOSIS — D509 Iron deficiency anemia, unspecified: Secondary | ICD-10-CM | POA: Diagnosis not present

## 2020-06-15 DIAGNOSIS — D631 Anemia in chronic kidney disease: Secondary | ICD-10-CM | POA: Diagnosis not present

## 2020-06-15 DIAGNOSIS — Z992 Dependence on renal dialysis: Secondary | ICD-10-CM | POA: Diagnosis not present

## 2020-06-15 DIAGNOSIS — N2581 Secondary hyperparathyroidism of renal origin: Secondary | ICD-10-CM | POA: Diagnosis not present

## 2020-06-15 DIAGNOSIS — T782XXA Anaphylactic shock, unspecified, initial encounter: Secondary | ICD-10-CM | POA: Diagnosis not present

## 2020-06-15 DIAGNOSIS — N186 End stage renal disease: Secondary | ICD-10-CM | POA: Diagnosis not present

## 2020-06-17 DIAGNOSIS — D509 Iron deficiency anemia, unspecified: Secondary | ICD-10-CM | POA: Diagnosis not present

## 2020-06-17 DIAGNOSIS — D631 Anemia in chronic kidney disease: Secondary | ICD-10-CM | POA: Diagnosis not present

## 2020-06-17 DIAGNOSIS — N186 End stage renal disease: Secondary | ICD-10-CM | POA: Diagnosis not present

## 2020-06-17 DIAGNOSIS — N2581 Secondary hyperparathyroidism of renal origin: Secondary | ICD-10-CM | POA: Diagnosis not present

## 2020-06-17 DIAGNOSIS — T782XXA Anaphylactic shock, unspecified, initial encounter: Secondary | ICD-10-CM | POA: Diagnosis not present

## 2020-06-17 DIAGNOSIS — Z992 Dependence on renal dialysis: Secondary | ICD-10-CM | POA: Diagnosis not present

## 2020-06-20 ENCOUNTER — Other Ambulatory Visit: Payer: Self-pay | Admitting: Physical Medicine & Rehabilitation

## 2020-06-20 DIAGNOSIS — N186 End stage renal disease: Secondary | ICD-10-CM | POA: Diagnosis not present

## 2020-06-20 DIAGNOSIS — N2581 Secondary hyperparathyroidism of renal origin: Secondary | ICD-10-CM | POA: Diagnosis not present

## 2020-06-20 DIAGNOSIS — D631 Anemia in chronic kidney disease: Secondary | ICD-10-CM | POA: Diagnosis not present

## 2020-06-20 DIAGNOSIS — T782XXA Anaphylactic shock, unspecified, initial encounter: Secondary | ICD-10-CM | POA: Diagnosis not present

## 2020-06-20 DIAGNOSIS — Z992 Dependence on renal dialysis: Secondary | ICD-10-CM | POA: Diagnosis not present

## 2020-06-20 DIAGNOSIS — D509 Iron deficiency anemia, unspecified: Secondary | ICD-10-CM | POA: Diagnosis not present

## 2020-06-22 DIAGNOSIS — Z992 Dependence on renal dialysis: Secondary | ICD-10-CM | POA: Diagnosis not present

## 2020-06-22 DIAGNOSIS — D509 Iron deficiency anemia, unspecified: Secondary | ICD-10-CM | POA: Diagnosis not present

## 2020-06-22 DIAGNOSIS — T782XXA Anaphylactic shock, unspecified, initial encounter: Secondary | ICD-10-CM | POA: Diagnosis not present

## 2020-06-22 DIAGNOSIS — D631 Anemia in chronic kidney disease: Secondary | ICD-10-CM | POA: Diagnosis not present

## 2020-06-22 DIAGNOSIS — N2581 Secondary hyperparathyroidism of renal origin: Secondary | ICD-10-CM | POA: Diagnosis not present

## 2020-06-22 DIAGNOSIS — N186 End stage renal disease: Secondary | ICD-10-CM | POA: Diagnosis not present

## 2020-06-23 DIAGNOSIS — U071 COVID-19: Secondary | ICD-10-CM | POA: Diagnosis not present

## 2020-06-23 DIAGNOSIS — Z20828 Contact with and (suspected) exposure to other viral communicable diseases: Secondary | ICD-10-CM | POA: Diagnosis not present

## 2020-06-24 DIAGNOSIS — D509 Iron deficiency anemia, unspecified: Secondary | ICD-10-CM | POA: Diagnosis not present

## 2020-06-24 DIAGNOSIS — T782XXA Anaphylactic shock, unspecified, initial encounter: Secondary | ICD-10-CM | POA: Diagnosis not present

## 2020-06-24 DIAGNOSIS — N2581 Secondary hyperparathyroidism of renal origin: Secondary | ICD-10-CM | POA: Diagnosis not present

## 2020-06-24 DIAGNOSIS — D631 Anemia in chronic kidney disease: Secondary | ICD-10-CM | POA: Diagnosis not present

## 2020-06-24 DIAGNOSIS — N186 End stage renal disease: Secondary | ICD-10-CM | POA: Diagnosis not present

## 2020-06-24 DIAGNOSIS — Z992 Dependence on renal dialysis: Secondary | ICD-10-CM | POA: Diagnosis not present

## 2020-06-27 DIAGNOSIS — N2581 Secondary hyperparathyroidism of renal origin: Secondary | ICD-10-CM | POA: Diagnosis not present

## 2020-06-27 DIAGNOSIS — D631 Anemia in chronic kidney disease: Secondary | ICD-10-CM | POA: Diagnosis not present

## 2020-06-27 DIAGNOSIS — Z992 Dependence on renal dialysis: Secondary | ICD-10-CM | POA: Diagnosis not present

## 2020-06-27 DIAGNOSIS — N186 End stage renal disease: Secondary | ICD-10-CM | POA: Diagnosis not present

## 2020-06-27 DIAGNOSIS — E1129 Type 2 diabetes mellitus with other diabetic kidney complication: Secondary | ICD-10-CM | POA: Diagnosis not present

## 2020-06-27 DIAGNOSIS — T8620 Unspecified complication of heart transplant: Secondary | ICD-10-CM | POA: Diagnosis not present

## 2020-06-27 DIAGNOSIS — D509 Iron deficiency anemia, unspecified: Secondary | ICD-10-CM | POA: Diagnosis not present

## 2020-06-29 DIAGNOSIS — N186 End stage renal disease: Secondary | ICD-10-CM | POA: Diagnosis not present

## 2020-06-29 DIAGNOSIS — D509 Iron deficiency anemia, unspecified: Secondary | ICD-10-CM | POA: Diagnosis not present

## 2020-06-29 DIAGNOSIS — N2581 Secondary hyperparathyroidism of renal origin: Secondary | ICD-10-CM | POA: Diagnosis not present

## 2020-06-29 DIAGNOSIS — E1129 Type 2 diabetes mellitus with other diabetic kidney complication: Secondary | ICD-10-CM | POA: Diagnosis not present

## 2020-06-29 DIAGNOSIS — Z992 Dependence on renal dialysis: Secondary | ICD-10-CM | POA: Diagnosis not present

## 2020-06-29 DIAGNOSIS — D631 Anemia in chronic kidney disease: Secondary | ICD-10-CM | POA: Diagnosis not present

## 2020-07-01 DIAGNOSIS — D631 Anemia in chronic kidney disease: Secondary | ICD-10-CM | POA: Diagnosis not present

## 2020-07-01 DIAGNOSIS — N2581 Secondary hyperparathyroidism of renal origin: Secondary | ICD-10-CM | POA: Diagnosis not present

## 2020-07-01 DIAGNOSIS — N186 End stage renal disease: Secondary | ICD-10-CM | POA: Diagnosis not present

## 2020-07-01 DIAGNOSIS — D509 Iron deficiency anemia, unspecified: Secondary | ICD-10-CM | POA: Diagnosis not present

## 2020-07-01 DIAGNOSIS — Z992 Dependence on renal dialysis: Secondary | ICD-10-CM | POA: Diagnosis not present

## 2020-07-04 DIAGNOSIS — D631 Anemia in chronic kidney disease: Secondary | ICD-10-CM | POA: Diagnosis not present

## 2020-07-04 DIAGNOSIS — N186 End stage renal disease: Secondary | ICD-10-CM | POA: Diagnosis not present

## 2020-07-04 DIAGNOSIS — N2581 Secondary hyperparathyroidism of renal origin: Secondary | ICD-10-CM | POA: Diagnosis not present

## 2020-07-04 DIAGNOSIS — D509 Iron deficiency anemia, unspecified: Secondary | ICD-10-CM | POA: Diagnosis not present

## 2020-07-04 DIAGNOSIS — Z992 Dependence on renal dialysis: Secondary | ICD-10-CM | POA: Diagnosis not present

## 2020-07-06 DIAGNOSIS — D509 Iron deficiency anemia, unspecified: Secondary | ICD-10-CM | POA: Diagnosis not present

## 2020-07-06 DIAGNOSIS — N186 End stage renal disease: Secondary | ICD-10-CM | POA: Diagnosis not present

## 2020-07-06 DIAGNOSIS — Z992 Dependence on renal dialysis: Secondary | ICD-10-CM | POA: Diagnosis not present

## 2020-07-06 DIAGNOSIS — N2581 Secondary hyperparathyroidism of renal origin: Secondary | ICD-10-CM | POA: Diagnosis not present

## 2020-07-06 DIAGNOSIS — D631 Anemia in chronic kidney disease: Secondary | ICD-10-CM | POA: Diagnosis not present

## 2020-07-08 DIAGNOSIS — D509 Iron deficiency anemia, unspecified: Secondary | ICD-10-CM | POA: Diagnosis not present

## 2020-07-08 DIAGNOSIS — Z992 Dependence on renal dialysis: Secondary | ICD-10-CM | POA: Diagnosis not present

## 2020-07-08 DIAGNOSIS — N2581 Secondary hyperparathyroidism of renal origin: Secondary | ICD-10-CM | POA: Diagnosis not present

## 2020-07-08 DIAGNOSIS — N186 End stage renal disease: Secondary | ICD-10-CM | POA: Diagnosis not present

## 2020-07-08 DIAGNOSIS — D631 Anemia in chronic kidney disease: Secondary | ICD-10-CM | POA: Diagnosis not present

## 2020-07-11 DIAGNOSIS — D631 Anemia in chronic kidney disease: Secondary | ICD-10-CM | POA: Diagnosis not present

## 2020-07-11 DIAGNOSIS — N186 End stage renal disease: Secondary | ICD-10-CM | POA: Diagnosis not present

## 2020-07-11 DIAGNOSIS — N2581 Secondary hyperparathyroidism of renal origin: Secondary | ICD-10-CM | POA: Diagnosis not present

## 2020-07-11 DIAGNOSIS — D509 Iron deficiency anemia, unspecified: Secondary | ICD-10-CM | POA: Diagnosis not present

## 2020-07-11 DIAGNOSIS — Z992 Dependence on renal dialysis: Secondary | ICD-10-CM | POA: Diagnosis not present

## 2020-07-11 DIAGNOSIS — E1129 Type 2 diabetes mellitus with other diabetic kidney complication: Secondary | ICD-10-CM | POA: Diagnosis not present

## 2020-07-12 ENCOUNTER — Other Ambulatory Visit: Payer: Self-pay | Admitting: Physical Medicine & Rehabilitation

## 2020-07-13 DIAGNOSIS — N186 End stage renal disease: Secondary | ICD-10-CM | POA: Diagnosis not present

## 2020-07-13 DIAGNOSIS — E1129 Type 2 diabetes mellitus with other diabetic kidney complication: Secondary | ICD-10-CM | POA: Diagnosis not present

## 2020-07-13 DIAGNOSIS — Z992 Dependence on renal dialysis: Secondary | ICD-10-CM | POA: Diagnosis not present

## 2020-07-13 DIAGNOSIS — N2581 Secondary hyperparathyroidism of renal origin: Secondary | ICD-10-CM | POA: Diagnosis not present

## 2020-07-13 DIAGNOSIS — D509 Iron deficiency anemia, unspecified: Secondary | ICD-10-CM | POA: Diagnosis not present

## 2020-07-13 DIAGNOSIS — D631 Anemia in chronic kidney disease: Secondary | ICD-10-CM | POA: Diagnosis not present

## 2020-07-15 DIAGNOSIS — N2581 Secondary hyperparathyroidism of renal origin: Secondary | ICD-10-CM | POA: Diagnosis not present

## 2020-07-15 DIAGNOSIS — N186 End stage renal disease: Secondary | ICD-10-CM | POA: Diagnosis not present

## 2020-07-15 DIAGNOSIS — Z992 Dependence on renal dialysis: Secondary | ICD-10-CM | POA: Diagnosis not present

## 2020-07-15 DIAGNOSIS — D509 Iron deficiency anemia, unspecified: Secondary | ICD-10-CM | POA: Diagnosis not present

## 2020-07-15 DIAGNOSIS — D631 Anemia in chronic kidney disease: Secondary | ICD-10-CM | POA: Diagnosis not present

## 2020-07-15 DIAGNOSIS — E1129 Type 2 diabetes mellitus with other diabetic kidney complication: Secondary | ICD-10-CM | POA: Diagnosis not present

## 2020-07-18 DIAGNOSIS — D631 Anemia in chronic kidney disease: Secondary | ICD-10-CM | POA: Diagnosis not present

## 2020-07-18 DIAGNOSIS — D509 Iron deficiency anemia, unspecified: Secondary | ICD-10-CM | POA: Diagnosis not present

## 2020-07-18 DIAGNOSIS — Z992 Dependence on renal dialysis: Secondary | ICD-10-CM | POA: Diagnosis not present

## 2020-07-18 DIAGNOSIS — N2581 Secondary hyperparathyroidism of renal origin: Secondary | ICD-10-CM | POA: Diagnosis not present

## 2020-07-18 DIAGNOSIS — N186 End stage renal disease: Secondary | ICD-10-CM | POA: Diagnosis not present

## 2020-07-20 DIAGNOSIS — Z992 Dependence on renal dialysis: Secondary | ICD-10-CM | POA: Diagnosis not present

## 2020-07-20 DIAGNOSIS — D509 Iron deficiency anemia, unspecified: Secondary | ICD-10-CM | POA: Diagnosis not present

## 2020-07-20 DIAGNOSIS — N2581 Secondary hyperparathyroidism of renal origin: Secondary | ICD-10-CM | POA: Diagnosis not present

## 2020-07-20 DIAGNOSIS — N186 End stage renal disease: Secondary | ICD-10-CM | POA: Diagnosis not present

## 2020-07-20 DIAGNOSIS — D631 Anemia in chronic kidney disease: Secondary | ICD-10-CM | POA: Diagnosis not present

## 2020-07-23 DIAGNOSIS — Z992 Dependence on renal dialysis: Secondary | ICD-10-CM | POA: Diagnosis not present

## 2020-07-23 DIAGNOSIS — N2581 Secondary hyperparathyroidism of renal origin: Secondary | ICD-10-CM | POA: Diagnosis not present

## 2020-07-23 DIAGNOSIS — D509 Iron deficiency anemia, unspecified: Secondary | ICD-10-CM | POA: Diagnosis not present

## 2020-07-23 DIAGNOSIS — N186 End stage renal disease: Secondary | ICD-10-CM | POA: Diagnosis not present

## 2020-07-23 DIAGNOSIS — D631 Anemia in chronic kidney disease: Secondary | ICD-10-CM | POA: Diagnosis not present

## 2020-07-25 DIAGNOSIS — D631 Anemia in chronic kidney disease: Secondary | ICD-10-CM | POA: Diagnosis not present

## 2020-07-25 DIAGNOSIS — Z992 Dependence on renal dialysis: Secondary | ICD-10-CM | POA: Diagnosis not present

## 2020-07-25 DIAGNOSIS — N2581 Secondary hyperparathyroidism of renal origin: Secondary | ICD-10-CM | POA: Diagnosis not present

## 2020-07-25 DIAGNOSIS — E1129 Type 2 diabetes mellitus with other diabetic kidney complication: Secondary | ICD-10-CM | POA: Diagnosis not present

## 2020-07-25 DIAGNOSIS — D509 Iron deficiency anemia, unspecified: Secondary | ICD-10-CM | POA: Diagnosis not present

## 2020-07-25 DIAGNOSIS — N186 End stage renal disease: Secondary | ICD-10-CM | POA: Diagnosis not present

## 2020-07-27 DIAGNOSIS — T8620 Unspecified complication of heart transplant: Secondary | ICD-10-CM | POA: Diagnosis not present

## 2020-07-27 DIAGNOSIS — D509 Iron deficiency anemia, unspecified: Secondary | ICD-10-CM | POA: Diagnosis not present

## 2020-07-27 DIAGNOSIS — E1129 Type 2 diabetes mellitus with other diabetic kidney complication: Secondary | ICD-10-CM | POA: Diagnosis not present

## 2020-07-27 DIAGNOSIS — D631 Anemia in chronic kidney disease: Secondary | ICD-10-CM | POA: Diagnosis not present

## 2020-07-27 DIAGNOSIS — Z992 Dependence on renal dialysis: Secondary | ICD-10-CM | POA: Diagnosis not present

## 2020-07-27 DIAGNOSIS — N186 End stage renal disease: Secondary | ICD-10-CM | POA: Diagnosis not present

## 2020-07-27 DIAGNOSIS — N2581 Secondary hyperparathyroidism of renal origin: Secondary | ICD-10-CM | POA: Diagnosis not present

## 2020-07-29 DIAGNOSIS — N186 End stage renal disease: Secondary | ICD-10-CM | POA: Diagnosis not present

## 2020-07-29 DIAGNOSIS — D509 Iron deficiency anemia, unspecified: Secondary | ICD-10-CM | POA: Diagnosis not present

## 2020-07-29 DIAGNOSIS — E1129 Type 2 diabetes mellitus with other diabetic kidney complication: Secondary | ICD-10-CM | POA: Diagnosis not present

## 2020-07-29 DIAGNOSIS — Z992 Dependence on renal dialysis: Secondary | ICD-10-CM | POA: Diagnosis not present

## 2020-07-29 DIAGNOSIS — N2581 Secondary hyperparathyroidism of renal origin: Secondary | ICD-10-CM | POA: Diagnosis not present

## 2020-07-29 DIAGNOSIS — D631 Anemia in chronic kidney disease: Secondary | ICD-10-CM | POA: Diagnosis not present

## 2020-08-01 DIAGNOSIS — D631 Anemia in chronic kidney disease: Secondary | ICD-10-CM | POA: Diagnosis not present

## 2020-08-01 DIAGNOSIS — E1129 Type 2 diabetes mellitus with other diabetic kidney complication: Secondary | ICD-10-CM | POA: Diagnosis not present

## 2020-08-01 DIAGNOSIS — D509 Iron deficiency anemia, unspecified: Secondary | ICD-10-CM | POA: Diagnosis not present

## 2020-08-01 DIAGNOSIS — N186 End stage renal disease: Secondary | ICD-10-CM | POA: Diagnosis not present

## 2020-08-01 DIAGNOSIS — N2581 Secondary hyperparathyroidism of renal origin: Secondary | ICD-10-CM | POA: Diagnosis not present

## 2020-08-01 DIAGNOSIS — Z992 Dependence on renal dialysis: Secondary | ICD-10-CM | POA: Diagnosis not present

## 2020-08-03 DIAGNOSIS — N186 End stage renal disease: Secondary | ICD-10-CM | POA: Diagnosis not present

## 2020-08-03 DIAGNOSIS — E1129 Type 2 diabetes mellitus with other diabetic kidney complication: Secondary | ICD-10-CM | POA: Diagnosis not present

## 2020-08-03 DIAGNOSIS — D509 Iron deficiency anemia, unspecified: Secondary | ICD-10-CM | POA: Diagnosis not present

## 2020-08-03 DIAGNOSIS — N2581 Secondary hyperparathyroidism of renal origin: Secondary | ICD-10-CM | POA: Diagnosis not present

## 2020-08-03 DIAGNOSIS — Z992 Dependence on renal dialysis: Secondary | ICD-10-CM | POA: Diagnosis not present

## 2020-08-03 DIAGNOSIS — D631 Anemia in chronic kidney disease: Secondary | ICD-10-CM | POA: Diagnosis not present

## 2020-08-05 DIAGNOSIS — N186 End stage renal disease: Secondary | ICD-10-CM | POA: Diagnosis not present

## 2020-08-05 DIAGNOSIS — E1129 Type 2 diabetes mellitus with other diabetic kidney complication: Secondary | ICD-10-CM | POA: Diagnosis not present

## 2020-08-05 DIAGNOSIS — D509 Iron deficiency anemia, unspecified: Secondary | ICD-10-CM | POA: Diagnosis not present

## 2020-08-05 DIAGNOSIS — Z992 Dependence on renal dialysis: Secondary | ICD-10-CM | POA: Diagnosis not present

## 2020-08-05 DIAGNOSIS — D631 Anemia in chronic kidney disease: Secondary | ICD-10-CM | POA: Diagnosis not present

## 2020-08-05 DIAGNOSIS — N2581 Secondary hyperparathyroidism of renal origin: Secondary | ICD-10-CM | POA: Diagnosis not present

## 2020-08-08 DIAGNOSIS — E1129 Type 2 diabetes mellitus with other diabetic kidney complication: Secondary | ICD-10-CM | POA: Diagnosis not present

## 2020-08-08 DIAGNOSIS — D509 Iron deficiency anemia, unspecified: Secondary | ICD-10-CM | POA: Diagnosis not present

## 2020-08-08 DIAGNOSIS — D631 Anemia in chronic kidney disease: Secondary | ICD-10-CM | POA: Diagnosis not present

## 2020-08-08 DIAGNOSIS — Z992 Dependence on renal dialysis: Secondary | ICD-10-CM | POA: Diagnosis not present

## 2020-08-08 DIAGNOSIS — N186 End stage renal disease: Secondary | ICD-10-CM | POA: Diagnosis not present

## 2020-08-08 DIAGNOSIS — N2581 Secondary hyperparathyroidism of renal origin: Secondary | ICD-10-CM | POA: Diagnosis not present

## 2020-08-10 DIAGNOSIS — N2581 Secondary hyperparathyroidism of renal origin: Secondary | ICD-10-CM | POA: Diagnosis not present

## 2020-08-10 DIAGNOSIS — D631 Anemia in chronic kidney disease: Secondary | ICD-10-CM | POA: Diagnosis not present

## 2020-08-10 DIAGNOSIS — N186 End stage renal disease: Secondary | ICD-10-CM | POA: Diagnosis not present

## 2020-08-10 DIAGNOSIS — E1129 Type 2 diabetes mellitus with other diabetic kidney complication: Secondary | ICD-10-CM | POA: Diagnosis not present

## 2020-08-10 DIAGNOSIS — Z992 Dependence on renal dialysis: Secondary | ICD-10-CM | POA: Diagnosis not present

## 2020-08-10 DIAGNOSIS — D509 Iron deficiency anemia, unspecified: Secondary | ICD-10-CM | POA: Diagnosis not present

## 2020-08-12 DIAGNOSIS — N186 End stage renal disease: Secondary | ICD-10-CM | POA: Diagnosis not present

## 2020-08-12 DIAGNOSIS — Z992 Dependence on renal dialysis: Secondary | ICD-10-CM | POA: Diagnosis not present

## 2020-08-12 DIAGNOSIS — N2581 Secondary hyperparathyroidism of renal origin: Secondary | ICD-10-CM | POA: Diagnosis not present

## 2020-08-12 DIAGNOSIS — D509 Iron deficiency anemia, unspecified: Secondary | ICD-10-CM | POA: Diagnosis not present

## 2020-08-12 DIAGNOSIS — E1129 Type 2 diabetes mellitus with other diabetic kidney complication: Secondary | ICD-10-CM | POA: Diagnosis not present

## 2020-08-12 DIAGNOSIS — D631 Anemia in chronic kidney disease: Secondary | ICD-10-CM | POA: Diagnosis not present

## 2020-08-15 DIAGNOSIS — Z992 Dependence on renal dialysis: Secondary | ICD-10-CM | POA: Diagnosis not present

## 2020-08-15 DIAGNOSIS — D631 Anemia in chronic kidney disease: Secondary | ICD-10-CM | POA: Diagnosis not present

## 2020-08-15 DIAGNOSIS — E1129 Type 2 diabetes mellitus with other diabetic kidney complication: Secondary | ICD-10-CM | POA: Diagnosis not present

## 2020-08-15 DIAGNOSIS — D509 Iron deficiency anemia, unspecified: Secondary | ICD-10-CM | POA: Diagnosis not present

## 2020-08-15 DIAGNOSIS — N186 End stage renal disease: Secondary | ICD-10-CM | POA: Diagnosis not present

## 2020-08-15 DIAGNOSIS — N2581 Secondary hyperparathyroidism of renal origin: Secondary | ICD-10-CM | POA: Diagnosis not present

## 2020-08-17 DIAGNOSIS — D509 Iron deficiency anemia, unspecified: Secondary | ICD-10-CM | POA: Diagnosis not present

## 2020-08-17 DIAGNOSIS — D631 Anemia in chronic kidney disease: Secondary | ICD-10-CM | POA: Diagnosis not present

## 2020-08-17 DIAGNOSIS — Z992 Dependence on renal dialysis: Secondary | ICD-10-CM | POA: Diagnosis not present

## 2020-08-17 DIAGNOSIS — N2581 Secondary hyperparathyroidism of renal origin: Secondary | ICD-10-CM | POA: Diagnosis not present

## 2020-08-17 DIAGNOSIS — N186 End stage renal disease: Secondary | ICD-10-CM | POA: Diagnosis not present

## 2020-08-17 DIAGNOSIS — E1129 Type 2 diabetes mellitus with other diabetic kidney complication: Secondary | ICD-10-CM | POA: Diagnosis not present

## 2020-08-19 DIAGNOSIS — D509 Iron deficiency anemia, unspecified: Secondary | ICD-10-CM | POA: Diagnosis not present

## 2020-08-19 DIAGNOSIS — Z992 Dependence on renal dialysis: Secondary | ICD-10-CM | POA: Diagnosis not present

## 2020-08-19 DIAGNOSIS — N186 End stage renal disease: Secondary | ICD-10-CM | POA: Diagnosis not present

## 2020-08-19 DIAGNOSIS — D631 Anemia in chronic kidney disease: Secondary | ICD-10-CM | POA: Diagnosis not present

## 2020-08-19 DIAGNOSIS — E1129 Type 2 diabetes mellitus with other diabetic kidney complication: Secondary | ICD-10-CM | POA: Diagnosis not present

## 2020-08-19 DIAGNOSIS — N2581 Secondary hyperparathyroidism of renal origin: Secondary | ICD-10-CM | POA: Diagnosis not present

## 2020-08-22 ENCOUNTER — Other Ambulatory Visit: Payer: Self-pay | Admitting: Physical Medicine & Rehabilitation

## 2020-08-22 DIAGNOSIS — N186 End stage renal disease: Secondary | ICD-10-CM | POA: Diagnosis not present

## 2020-08-22 DIAGNOSIS — E1129 Type 2 diabetes mellitus with other diabetic kidney complication: Secondary | ICD-10-CM | POA: Diagnosis not present

## 2020-08-22 DIAGNOSIS — D509 Iron deficiency anemia, unspecified: Secondary | ICD-10-CM | POA: Diagnosis not present

## 2020-08-22 DIAGNOSIS — D631 Anemia in chronic kidney disease: Secondary | ICD-10-CM | POA: Diagnosis not present

## 2020-08-22 DIAGNOSIS — N2581 Secondary hyperparathyroidism of renal origin: Secondary | ICD-10-CM | POA: Diagnosis not present

## 2020-08-22 DIAGNOSIS — Z992 Dependence on renal dialysis: Secondary | ICD-10-CM | POA: Diagnosis not present

## 2020-08-24 DIAGNOSIS — N2581 Secondary hyperparathyroidism of renal origin: Secondary | ICD-10-CM | POA: Diagnosis not present

## 2020-08-24 DIAGNOSIS — Z992 Dependence on renal dialysis: Secondary | ICD-10-CM | POA: Diagnosis not present

## 2020-08-24 DIAGNOSIS — E1129 Type 2 diabetes mellitus with other diabetic kidney complication: Secondary | ICD-10-CM | POA: Diagnosis not present

## 2020-08-24 DIAGNOSIS — N186 End stage renal disease: Secondary | ICD-10-CM | POA: Diagnosis not present

## 2020-08-24 DIAGNOSIS — D509 Iron deficiency anemia, unspecified: Secondary | ICD-10-CM | POA: Diagnosis not present

## 2020-08-24 DIAGNOSIS — D631 Anemia in chronic kidney disease: Secondary | ICD-10-CM | POA: Diagnosis not present

## 2020-08-26 DIAGNOSIS — N186 End stage renal disease: Secondary | ICD-10-CM | POA: Diagnosis not present

## 2020-08-26 DIAGNOSIS — D631 Anemia in chronic kidney disease: Secondary | ICD-10-CM | POA: Diagnosis not present

## 2020-08-26 DIAGNOSIS — Z992 Dependence on renal dialysis: Secondary | ICD-10-CM | POA: Diagnosis not present

## 2020-08-26 DIAGNOSIS — N2581 Secondary hyperparathyroidism of renal origin: Secondary | ICD-10-CM | POA: Diagnosis not present

## 2020-08-26 DIAGNOSIS — D509 Iron deficiency anemia, unspecified: Secondary | ICD-10-CM | POA: Diagnosis not present

## 2020-08-26 DIAGNOSIS — E1129 Type 2 diabetes mellitus with other diabetic kidney complication: Secondary | ICD-10-CM | POA: Diagnosis not present

## 2020-08-27 DIAGNOSIS — N186 End stage renal disease: Secondary | ICD-10-CM | POA: Diagnosis not present

## 2020-08-27 DIAGNOSIS — Z992 Dependence on renal dialysis: Secondary | ICD-10-CM | POA: Diagnosis not present

## 2020-08-27 DIAGNOSIS — T8620 Unspecified complication of heart transplant: Secondary | ICD-10-CM | POA: Diagnosis not present

## 2020-08-29 DIAGNOSIS — D509 Iron deficiency anemia, unspecified: Secondary | ICD-10-CM | POA: Diagnosis not present

## 2020-08-29 DIAGNOSIS — Z992 Dependence on renal dialysis: Secondary | ICD-10-CM | POA: Diagnosis not present

## 2020-08-29 DIAGNOSIS — T7840XA Allergy, unspecified, initial encounter: Secondary | ICD-10-CM | POA: Diagnosis not present

## 2020-08-29 DIAGNOSIS — D631 Anemia in chronic kidney disease: Secondary | ICD-10-CM | POA: Diagnosis not present

## 2020-08-29 DIAGNOSIS — N186 End stage renal disease: Secondary | ICD-10-CM | POA: Diagnosis not present

## 2020-08-29 DIAGNOSIS — E1129 Type 2 diabetes mellitus with other diabetic kidney complication: Secondary | ICD-10-CM | POA: Diagnosis not present

## 2020-08-29 DIAGNOSIS — N2581 Secondary hyperparathyroidism of renal origin: Secondary | ICD-10-CM | POA: Diagnosis not present

## 2020-08-31 DIAGNOSIS — Z992 Dependence on renal dialysis: Secondary | ICD-10-CM | POA: Diagnosis not present

## 2020-08-31 DIAGNOSIS — T7840XA Allergy, unspecified, initial encounter: Secondary | ICD-10-CM | POA: Diagnosis not present

## 2020-08-31 DIAGNOSIS — D509 Iron deficiency anemia, unspecified: Secondary | ICD-10-CM | POA: Diagnosis not present

## 2020-08-31 DIAGNOSIS — N186 End stage renal disease: Secondary | ICD-10-CM | POA: Diagnosis not present

## 2020-08-31 DIAGNOSIS — E1129 Type 2 diabetes mellitus with other diabetic kidney complication: Secondary | ICD-10-CM | POA: Diagnosis not present

## 2020-08-31 DIAGNOSIS — N2581 Secondary hyperparathyroidism of renal origin: Secondary | ICD-10-CM | POA: Diagnosis not present

## 2020-09-02 DIAGNOSIS — E1129 Type 2 diabetes mellitus with other diabetic kidney complication: Secondary | ICD-10-CM | POA: Diagnosis not present

## 2020-09-02 DIAGNOSIS — D509 Iron deficiency anemia, unspecified: Secondary | ICD-10-CM | POA: Diagnosis not present

## 2020-09-02 DIAGNOSIS — Z992 Dependence on renal dialysis: Secondary | ICD-10-CM | POA: Diagnosis not present

## 2020-09-02 DIAGNOSIS — N2581 Secondary hyperparathyroidism of renal origin: Secondary | ICD-10-CM | POA: Diagnosis not present

## 2020-09-02 DIAGNOSIS — N186 End stage renal disease: Secondary | ICD-10-CM | POA: Diagnosis not present

## 2020-09-02 DIAGNOSIS — T7840XA Allergy, unspecified, initial encounter: Secondary | ICD-10-CM | POA: Diagnosis not present

## 2020-09-05 DIAGNOSIS — D509 Iron deficiency anemia, unspecified: Secondary | ICD-10-CM | POA: Diagnosis not present

## 2020-09-05 DIAGNOSIS — Z992 Dependence on renal dialysis: Secondary | ICD-10-CM | POA: Diagnosis not present

## 2020-09-05 DIAGNOSIS — T7840XA Allergy, unspecified, initial encounter: Secondary | ICD-10-CM | POA: Diagnosis not present

## 2020-09-05 DIAGNOSIS — N2581 Secondary hyperparathyroidism of renal origin: Secondary | ICD-10-CM | POA: Diagnosis not present

## 2020-09-05 DIAGNOSIS — E1129 Type 2 diabetes mellitus with other diabetic kidney complication: Secondary | ICD-10-CM | POA: Diagnosis not present

## 2020-09-05 DIAGNOSIS — N186 End stage renal disease: Secondary | ICD-10-CM | POA: Diagnosis not present

## 2020-09-07 DIAGNOSIS — N186 End stage renal disease: Secondary | ICD-10-CM | POA: Diagnosis not present

## 2020-09-07 DIAGNOSIS — T7840XA Allergy, unspecified, initial encounter: Secondary | ICD-10-CM | POA: Diagnosis not present

## 2020-09-07 DIAGNOSIS — E1129 Type 2 diabetes mellitus with other diabetic kidney complication: Secondary | ICD-10-CM | POA: Diagnosis not present

## 2020-09-07 DIAGNOSIS — Z992 Dependence on renal dialysis: Secondary | ICD-10-CM | POA: Diagnosis not present

## 2020-09-07 DIAGNOSIS — D509 Iron deficiency anemia, unspecified: Secondary | ICD-10-CM | POA: Diagnosis not present

## 2020-09-07 DIAGNOSIS — N2581 Secondary hyperparathyroidism of renal origin: Secondary | ICD-10-CM | POA: Diagnosis not present

## 2020-09-09 DIAGNOSIS — N2581 Secondary hyperparathyroidism of renal origin: Secondary | ICD-10-CM | POA: Diagnosis not present

## 2020-09-09 DIAGNOSIS — D509 Iron deficiency anemia, unspecified: Secondary | ICD-10-CM | POA: Diagnosis not present

## 2020-09-09 DIAGNOSIS — Z992 Dependence on renal dialysis: Secondary | ICD-10-CM | POA: Diagnosis not present

## 2020-09-09 DIAGNOSIS — N186 End stage renal disease: Secondary | ICD-10-CM | POA: Diagnosis not present

## 2020-09-09 DIAGNOSIS — T7840XA Allergy, unspecified, initial encounter: Secondary | ICD-10-CM | POA: Diagnosis not present

## 2020-09-09 DIAGNOSIS — E1129 Type 2 diabetes mellitus with other diabetic kidney complication: Secondary | ICD-10-CM | POA: Diagnosis not present

## 2020-09-12 DIAGNOSIS — N186 End stage renal disease: Secondary | ICD-10-CM | POA: Diagnosis not present

## 2020-09-12 DIAGNOSIS — E1129 Type 2 diabetes mellitus with other diabetic kidney complication: Secondary | ICD-10-CM | POA: Diagnosis not present

## 2020-09-12 DIAGNOSIS — D509 Iron deficiency anemia, unspecified: Secondary | ICD-10-CM | POA: Diagnosis not present

## 2020-09-12 DIAGNOSIS — Z992 Dependence on renal dialysis: Secondary | ICD-10-CM | POA: Diagnosis not present

## 2020-09-12 DIAGNOSIS — N2581 Secondary hyperparathyroidism of renal origin: Secondary | ICD-10-CM | POA: Diagnosis not present

## 2020-09-12 DIAGNOSIS — T7840XA Allergy, unspecified, initial encounter: Secondary | ICD-10-CM | POA: Diagnosis not present

## 2020-09-14 DIAGNOSIS — N2581 Secondary hyperparathyroidism of renal origin: Secondary | ICD-10-CM | POA: Diagnosis not present

## 2020-09-14 DIAGNOSIS — Z992 Dependence on renal dialysis: Secondary | ICD-10-CM | POA: Diagnosis not present

## 2020-09-14 DIAGNOSIS — D509 Iron deficiency anemia, unspecified: Secondary | ICD-10-CM | POA: Diagnosis not present

## 2020-09-14 DIAGNOSIS — T7840XA Allergy, unspecified, initial encounter: Secondary | ICD-10-CM | POA: Diagnosis not present

## 2020-09-14 DIAGNOSIS — N186 End stage renal disease: Secondary | ICD-10-CM | POA: Diagnosis not present

## 2020-09-14 DIAGNOSIS — E1129 Type 2 diabetes mellitus with other diabetic kidney complication: Secondary | ICD-10-CM | POA: Diagnosis not present

## 2020-09-16 DIAGNOSIS — N2581 Secondary hyperparathyroidism of renal origin: Secondary | ICD-10-CM | POA: Diagnosis not present

## 2020-09-16 DIAGNOSIS — Z992 Dependence on renal dialysis: Secondary | ICD-10-CM | POA: Diagnosis not present

## 2020-09-16 DIAGNOSIS — D509 Iron deficiency anemia, unspecified: Secondary | ICD-10-CM | POA: Diagnosis not present

## 2020-09-16 DIAGNOSIS — N186 End stage renal disease: Secondary | ICD-10-CM | POA: Diagnosis not present

## 2020-09-16 DIAGNOSIS — E1129 Type 2 diabetes mellitus with other diabetic kidney complication: Secondary | ICD-10-CM | POA: Diagnosis not present

## 2020-09-16 DIAGNOSIS — T7840XA Allergy, unspecified, initial encounter: Secondary | ICD-10-CM | POA: Diagnosis not present

## 2020-09-19 DIAGNOSIS — D509 Iron deficiency anemia, unspecified: Secondary | ICD-10-CM | POA: Diagnosis not present

## 2020-09-19 DIAGNOSIS — T7840XA Allergy, unspecified, initial encounter: Secondary | ICD-10-CM | POA: Diagnosis not present

## 2020-09-19 DIAGNOSIS — N186 End stage renal disease: Secondary | ICD-10-CM | POA: Diagnosis not present

## 2020-09-19 DIAGNOSIS — N2581 Secondary hyperparathyroidism of renal origin: Secondary | ICD-10-CM | POA: Diagnosis not present

## 2020-09-19 DIAGNOSIS — E1129 Type 2 diabetes mellitus with other diabetic kidney complication: Secondary | ICD-10-CM | POA: Diagnosis not present

## 2020-09-19 DIAGNOSIS — Z992 Dependence on renal dialysis: Secondary | ICD-10-CM | POA: Diagnosis not present

## 2020-09-21 DIAGNOSIS — N186 End stage renal disease: Secondary | ICD-10-CM | POA: Diagnosis not present

## 2020-09-21 DIAGNOSIS — T7840XA Allergy, unspecified, initial encounter: Secondary | ICD-10-CM | POA: Diagnosis not present

## 2020-09-21 DIAGNOSIS — E1129 Type 2 diabetes mellitus with other diabetic kidney complication: Secondary | ICD-10-CM | POA: Diagnosis not present

## 2020-09-21 DIAGNOSIS — Z992 Dependence on renal dialysis: Secondary | ICD-10-CM | POA: Diagnosis not present

## 2020-09-21 DIAGNOSIS — N2581 Secondary hyperparathyroidism of renal origin: Secondary | ICD-10-CM | POA: Diagnosis not present

## 2020-09-21 DIAGNOSIS — D509 Iron deficiency anemia, unspecified: Secondary | ICD-10-CM | POA: Diagnosis not present

## 2020-09-23 DIAGNOSIS — E1129 Type 2 diabetes mellitus with other diabetic kidney complication: Secondary | ICD-10-CM | POA: Diagnosis not present

## 2020-09-23 DIAGNOSIS — N186 End stage renal disease: Secondary | ICD-10-CM | POA: Diagnosis not present

## 2020-09-23 DIAGNOSIS — Z992 Dependence on renal dialysis: Secondary | ICD-10-CM | POA: Diagnosis not present

## 2020-09-23 DIAGNOSIS — D509 Iron deficiency anemia, unspecified: Secondary | ICD-10-CM | POA: Diagnosis not present

## 2020-09-23 DIAGNOSIS — N2581 Secondary hyperparathyroidism of renal origin: Secondary | ICD-10-CM | POA: Diagnosis not present

## 2020-09-23 DIAGNOSIS — T7840XA Allergy, unspecified, initial encounter: Secondary | ICD-10-CM | POA: Diagnosis not present

## 2020-09-26 DIAGNOSIS — D509 Iron deficiency anemia, unspecified: Secondary | ICD-10-CM | POA: Diagnosis not present

## 2020-09-26 DIAGNOSIS — N186 End stage renal disease: Secondary | ICD-10-CM | POA: Diagnosis not present

## 2020-09-26 DIAGNOSIS — Z992 Dependence on renal dialysis: Secondary | ICD-10-CM | POA: Diagnosis not present

## 2020-09-26 DIAGNOSIS — N2581 Secondary hyperparathyroidism of renal origin: Secondary | ICD-10-CM | POA: Diagnosis not present

## 2020-09-26 DIAGNOSIS — E1129 Type 2 diabetes mellitus with other diabetic kidney complication: Secondary | ICD-10-CM | POA: Diagnosis not present

## 2020-09-26 DIAGNOSIS — T7840XA Allergy, unspecified, initial encounter: Secondary | ICD-10-CM | POA: Diagnosis not present

## 2020-09-27 DIAGNOSIS — N186 End stage renal disease: Secondary | ICD-10-CM | POA: Diagnosis not present

## 2020-09-27 DIAGNOSIS — T8620 Unspecified complication of heart transplant: Secondary | ICD-10-CM | POA: Diagnosis not present

## 2020-09-27 DIAGNOSIS — Z992 Dependence on renal dialysis: Secondary | ICD-10-CM | POA: Diagnosis not present

## 2020-09-28 DIAGNOSIS — N186 End stage renal disease: Secondary | ICD-10-CM | POA: Diagnosis not present

## 2020-09-28 DIAGNOSIS — E1129 Type 2 diabetes mellitus with other diabetic kidney complication: Secondary | ICD-10-CM | POA: Diagnosis not present

## 2020-09-28 DIAGNOSIS — N2581 Secondary hyperparathyroidism of renal origin: Secondary | ICD-10-CM | POA: Diagnosis not present

## 2020-09-28 DIAGNOSIS — D509 Iron deficiency anemia, unspecified: Secondary | ICD-10-CM | POA: Diagnosis not present

## 2020-09-28 DIAGNOSIS — Z992 Dependence on renal dialysis: Secondary | ICD-10-CM | POA: Diagnosis not present

## 2020-09-28 DIAGNOSIS — D631 Anemia in chronic kidney disease: Secondary | ICD-10-CM | POA: Diagnosis not present

## 2020-09-30 DIAGNOSIS — N186 End stage renal disease: Secondary | ICD-10-CM | POA: Diagnosis not present

## 2020-09-30 DIAGNOSIS — D509 Iron deficiency anemia, unspecified: Secondary | ICD-10-CM | POA: Diagnosis not present

## 2020-09-30 DIAGNOSIS — Z992 Dependence on renal dialysis: Secondary | ICD-10-CM | POA: Diagnosis not present

## 2020-09-30 DIAGNOSIS — N2581 Secondary hyperparathyroidism of renal origin: Secondary | ICD-10-CM | POA: Diagnosis not present

## 2020-09-30 DIAGNOSIS — D631 Anemia in chronic kidney disease: Secondary | ICD-10-CM | POA: Diagnosis not present

## 2020-10-03 DIAGNOSIS — N186 End stage renal disease: Secondary | ICD-10-CM | POA: Diagnosis not present

## 2020-10-03 DIAGNOSIS — N2581 Secondary hyperparathyroidism of renal origin: Secondary | ICD-10-CM | POA: Diagnosis not present

## 2020-10-03 DIAGNOSIS — Z992 Dependence on renal dialysis: Secondary | ICD-10-CM | POA: Diagnosis not present

## 2020-10-03 DIAGNOSIS — D509 Iron deficiency anemia, unspecified: Secondary | ICD-10-CM | POA: Diagnosis not present

## 2020-10-03 DIAGNOSIS — D631 Anemia in chronic kidney disease: Secondary | ICD-10-CM | POA: Diagnosis not present

## 2020-10-05 DIAGNOSIS — N2581 Secondary hyperparathyroidism of renal origin: Secondary | ICD-10-CM | POA: Diagnosis not present

## 2020-10-05 DIAGNOSIS — N186 End stage renal disease: Secondary | ICD-10-CM | POA: Diagnosis not present

## 2020-10-05 DIAGNOSIS — Z992 Dependence on renal dialysis: Secondary | ICD-10-CM | POA: Diagnosis not present

## 2020-10-05 DIAGNOSIS — D631 Anemia in chronic kidney disease: Secondary | ICD-10-CM | POA: Diagnosis not present

## 2020-10-05 DIAGNOSIS — D509 Iron deficiency anemia, unspecified: Secondary | ICD-10-CM | POA: Diagnosis not present

## 2020-10-07 DIAGNOSIS — N186 End stage renal disease: Secondary | ICD-10-CM | POA: Diagnosis not present

## 2020-10-07 DIAGNOSIS — Z992 Dependence on renal dialysis: Secondary | ICD-10-CM | POA: Diagnosis not present

## 2020-10-07 DIAGNOSIS — N2581 Secondary hyperparathyroidism of renal origin: Secondary | ICD-10-CM | POA: Diagnosis not present

## 2020-10-07 DIAGNOSIS — D631 Anemia in chronic kidney disease: Secondary | ICD-10-CM | POA: Diagnosis not present

## 2020-10-07 DIAGNOSIS — D509 Iron deficiency anemia, unspecified: Secondary | ICD-10-CM | POA: Diagnosis not present

## 2020-10-10 DIAGNOSIS — D509 Iron deficiency anemia, unspecified: Secondary | ICD-10-CM | POA: Diagnosis not present

## 2020-10-10 DIAGNOSIS — Z992 Dependence on renal dialysis: Secondary | ICD-10-CM | POA: Diagnosis not present

## 2020-10-10 DIAGNOSIS — N186 End stage renal disease: Secondary | ICD-10-CM | POA: Diagnosis not present

## 2020-10-10 DIAGNOSIS — D631 Anemia in chronic kidney disease: Secondary | ICD-10-CM | POA: Diagnosis not present

## 2020-10-10 DIAGNOSIS — N2581 Secondary hyperparathyroidism of renal origin: Secondary | ICD-10-CM | POA: Diagnosis not present

## 2020-10-12 DIAGNOSIS — D631 Anemia in chronic kidney disease: Secondary | ICD-10-CM | POA: Diagnosis not present

## 2020-10-12 DIAGNOSIS — D509 Iron deficiency anemia, unspecified: Secondary | ICD-10-CM | POA: Diagnosis not present

## 2020-10-12 DIAGNOSIS — N2581 Secondary hyperparathyroidism of renal origin: Secondary | ICD-10-CM | POA: Diagnosis not present

## 2020-10-12 DIAGNOSIS — N186 End stage renal disease: Secondary | ICD-10-CM | POA: Diagnosis not present

## 2020-10-12 DIAGNOSIS — Z992 Dependence on renal dialysis: Secondary | ICD-10-CM | POA: Diagnosis not present

## 2020-10-14 DIAGNOSIS — D509 Iron deficiency anemia, unspecified: Secondary | ICD-10-CM | POA: Diagnosis not present

## 2020-10-14 DIAGNOSIS — N2581 Secondary hyperparathyroidism of renal origin: Secondary | ICD-10-CM | POA: Diagnosis not present

## 2020-10-14 DIAGNOSIS — N186 End stage renal disease: Secondary | ICD-10-CM | POA: Diagnosis not present

## 2020-10-14 DIAGNOSIS — Z992 Dependence on renal dialysis: Secondary | ICD-10-CM | POA: Diagnosis not present

## 2020-10-14 DIAGNOSIS — D631 Anemia in chronic kidney disease: Secondary | ICD-10-CM | POA: Diagnosis not present

## 2020-10-17 DIAGNOSIS — N186 End stage renal disease: Secondary | ICD-10-CM | POA: Diagnosis not present

## 2020-10-17 DIAGNOSIS — D509 Iron deficiency anemia, unspecified: Secondary | ICD-10-CM | POA: Diagnosis not present

## 2020-10-17 DIAGNOSIS — D631 Anemia in chronic kidney disease: Secondary | ICD-10-CM | POA: Diagnosis not present

## 2020-10-17 DIAGNOSIS — N2581 Secondary hyperparathyroidism of renal origin: Secondary | ICD-10-CM | POA: Diagnosis not present

## 2020-10-17 DIAGNOSIS — Z992 Dependence on renal dialysis: Secondary | ICD-10-CM | POA: Diagnosis not present

## 2020-10-19 DIAGNOSIS — D631 Anemia in chronic kidney disease: Secondary | ICD-10-CM | POA: Diagnosis not present

## 2020-10-19 DIAGNOSIS — N186 End stage renal disease: Secondary | ICD-10-CM | POA: Diagnosis not present

## 2020-10-19 DIAGNOSIS — Z992 Dependence on renal dialysis: Secondary | ICD-10-CM | POA: Diagnosis not present

## 2020-10-19 DIAGNOSIS — N2581 Secondary hyperparathyroidism of renal origin: Secondary | ICD-10-CM | POA: Diagnosis not present

## 2020-10-19 DIAGNOSIS — D509 Iron deficiency anemia, unspecified: Secondary | ICD-10-CM | POA: Diagnosis not present

## 2020-10-21 DIAGNOSIS — D631 Anemia in chronic kidney disease: Secondary | ICD-10-CM | POA: Diagnosis not present

## 2020-10-21 DIAGNOSIS — Z992 Dependence on renal dialysis: Secondary | ICD-10-CM | POA: Diagnosis not present

## 2020-10-21 DIAGNOSIS — N2581 Secondary hyperparathyroidism of renal origin: Secondary | ICD-10-CM | POA: Diagnosis not present

## 2020-10-21 DIAGNOSIS — N186 End stage renal disease: Secondary | ICD-10-CM | POA: Diagnosis not present

## 2020-10-21 DIAGNOSIS — D509 Iron deficiency anemia, unspecified: Secondary | ICD-10-CM | POA: Diagnosis not present

## 2020-10-24 DIAGNOSIS — D509 Iron deficiency anemia, unspecified: Secondary | ICD-10-CM | POA: Diagnosis not present

## 2020-10-24 DIAGNOSIS — N2581 Secondary hyperparathyroidism of renal origin: Secondary | ICD-10-CM | POA: Diagnosis not present

## 2020-10-24 DIAGNOSIS — N186 End stage renal disease: Secondary | ICD-10-CM | POA: Diagnosis not present

## 2020-10-24 DIAGNOSIS — D631 Anemia in chronic kidney disease: Secondary | ICD-10-CM | POA: Diagnosis not present

## 2020-10-24 DIAGNOSIS — Z992 Dependence on renal dialysis: Secondary | ICD-10-CM | POA: Diagnosis not present

## 2020-10-25 DIAGNOSIS — T8620 Unspecified complication of heart transplant: Secondary | ICD-10-CM | POA: Diagnosis not present

## 2020-10-25 DIAGNOSIS — Z992 Dependence on renal dialysis: Secondary | ICD-10-CM | POA: Diagnosis not present

## 2020-10-25 DIAGNOSIS — N186 End stage renal disease: Secondary | ICD-10-CM | POA: Diagnosis not present

## 2020-10-26 DIAGNOSIS — D509 Iron deficiency anemia, unspecified: Secondary | ICD-10-CM | POA: Diagnosis not present

## 2020-10-26 DIAGNOSIS — Z23 Encounter for immunization: Secondary | ICD-10-CM | POA: Diagnosis not present

## 2020-10-26 DIAGNOSIS — Z283 Underimmunization status: Secondary | ICD-10-CM | POA: Diagnosis not present

## 2020-10-26 DIAGNOSIS — N2581 Secondary hyperparathyroidism of renal origin: Secondary | ICD-10-CM | POA: Diagnosis not present

## 2020-10-26 DIAGNOSIS — N186 End stage renal disease: Secondary | ICD-10-CM | POA: Diagnosis not present

## 2020-10-26 DIAGNOSIS — E1129 Type 2 diabetes mellitus with other diabetic kidney complication: Secondary | ICD-10-CM | POA: Diagnosis not present

## 2020-10-26 DIAGNOSIS — Z992 Dependence on renal dialysis: Secondary | ICD-10-CM | POA: Diagnosis not present

## 2020-10-26 DIAGNOSIS — D631 Anemia in chronic kidney disease: Secondary | ICD-10-CM | POA: Diagnosis not present

## 2020-10-27 DIAGNOSIS — H2513 Age-related nuclear cataract, bilateral: Secondary | ICD-10-CM | POA: Diagnosis not present

## 2020-10-27 DIAGNOSIS — H40033 Anatomical narrow angle, bilateral: Secondary | ICD-10-CM | POA: Diagnosis not present

## 2020-10-28 DIAGNOSIS — E1129 Type 2 diabetes mellitus with other diabetic kidney complication: Secondary | ICD-10-CM | POA: Diagnosis not present

## 2020-10-28 DIAGNOSIS — D631 Anemia in chronic kidney disease: Secondary | ICD-10-CM | POA: Diagnosis not present

## 2020-10-28 DIAGNOSIS — N2581 Secondary hyperparathyroidism of renal origin: Secondary | ICD-10-CM | POA: Diagnosis not present

## 2020-10-28 DIAGNOSIS — Z992 Dependence on renal dialysis: Secondary | ICD-10-CM | POA: Diagnosis not present

## 2020-10-28 DIAGNOSIS — D509 Iron deficiency anemia, unspecified: Secondary | ICD-10-CM | POA: Diagnosis not present

## 2020-10-28 DIAGNOSIS — N186 End stage renal disease: Secondary | ICD-10-CM | POA: Diagnosis not present

## 2020-10-31 DIAGNOSIS — E1129 Type 2 diabetes mellitus with other diabetic kidney complication: Secondary | ICD-10-CM | POA: Diagnosis not present

## 2020-10-31 DIAGNOSIS — D631 Anemia in chronic kidney disease: Secondary | ICD-10-CM | POA: Diagnosis not present

## 2020-10-31 DIAGNOSIS — N2581 Secondary hyperparathyroidism of renal origin: Secondary | ICD-10-CM | POA: Diagnosis not present

## 2020-10-31 DIAGNOSIS — D509 Iron deficiency anemia, unspecified: Secondary | ICD-10-CM | POA: Diagnosis not present

## 2020-10-31 DIAGNOSIS — Z992 Dependence on renal dialysis: Secondary | ICD-10-CM | POA: Diagnosis not present

## 2020-10-31 DIAGNOSIS — N186 End stage renal disease: Secondary | ICD-10-CM | POA: Diagnosis not present

## 2020-11-02 DIAGNOSIS — D631 Anemia in chronic kidney disease: Secondary | ICD-10-CM | POA: Diagnosis not present

## 2020-11-02 DIAGNOSIS — Z992 Dependence on renal dialysis: Secondary | ICD-10-CM | POA: Diagnosis not present

## 2020-11-02 DIAGNOSIS — D509 Iron deficiency anemia, unspecified: Secondary | ICD-10-CM | POA: Diagnosis not present

## 2020-11-02 DIAGNOSIS — N2581 Secondary hyperparathyroidism of renal origin: Secondary | ICD-10-CM | POA: Diagnosis not present

## 2020-11-02 DIAGNOSIS — E1129 Type 2 diabetes mellitus with other diabetic kidney complication: Secondary | ICD-10-CM | POA: Diagnosis not present

## 2020-11-02 DIAGNOSIS — N186 End stage renal disease: Secondary | ICD-10-CM | POA: Diagnosis not present

## 2020-11-04 DIAGNOSIS — D631 Anemia in chronic kidney disease: Secondary | ICD-10-CM | POA: Diagnosis not present

## 2020-11-04 DIAGNOSIS — N2581 Secondary hyperparathyroidism of renal origin: Secondary | ICD-10-CM | POA: Diagnosis not present

## 2020-11-04 DIAGNOSIS — D509 Iron deficiency anemia, unspecified: Secondary | ICD-10-CM | POA: Diagnosis not present

## 2020-11-04 DIAGNOSIS — E1129 Type 2 diabetes mellitus with other diabetic kidney complication: Secondary | ICD-10-CM | POA: Diagnosis not present

## 2020-11-04 DIAGNOSIS — N186 End stage renal disease: Secondary | ICD-10-CM | POA: Diagnosis not present

## 2020-11-04 DIAGNOSIS — Z992 Dependence on renal dialysis: Secondary | ICD-10-CM | POA: Diagnosis not present

## 2020-11-07 DIAGNOSIS — N186 End stage renal disease: Secondary | ICD-10-CM | POA: Diagnosis not present

## 2020-11-07 DIAGNOSIS — E1129 Type 2 diabetes mellitus with other diabetic kidney complication: Secondary | ICD-10-CM | POA: Diagnosis not present

## 2020-11-07 DIAGNOSIS — D631 Anemia in chronic kidney disease: Secondary | ICD-10-CM | POA: Diagnosis not present

## 2020-11-07 DIAGNOSIS — Z992 Dependence on renal dialysis: Secondary | ICD-10-CM | POA: Diagnosis not present

## 2020-11-07 DIAGNOSIS — N2581 Secondary hyperparathyroidism of renal origin: Secondary | ICD-10-CM | POA: Diagnosis not present

## 2020-11-07 DIAGNOSIS — D509 Iron deficiency anemia, unspecified: Secondary | ICD-10-CM | POA: Diagnosis not present

## 2020-11-09 DIAGNOSIS — D631 Anemia in chronic kidney disease: Secondary | ICD-10-CM | POA: Diagnosis not present

## 2020-11-09 DIAGNOSIS — N2581 Secondary hyperparathyroidism of renal origin: Secondary | ICD-10-CM | POA: Diagnosis not present

## 2020-11-09 DIAGNOSIS — N186 End stage renal disease: Secondary | ICD-10-CM | POA: Diagnosis not present

## 2020-11-09 DIAGNOSIS — E1129 Type 2 diabetes mellitus with other diabetic kidney complication: Secondary | ICD-10-CM | POA: Diagnosis not present

## 2020-11-09 DIAGNOSIS — D509 Iron deficiency anemia, unspecified: Secondary | ICD-10-CM | POA: Diagnosis not present

## 2020-11-09 DIAGNOSIS — Z992 Dependence on renal dialysis: Secondary | ICD-10-CM | POA: Diagnosis not present

## 2020-11-10 DIAGNOSIS — Z23 Encounter for immunization: Secondary | ICD-10-CM | POA: Diagnosis not present

## 2020-11-10 DIAGNOSIS — Z941 Heart transplant status: Secondary | ICD-10-CM | POA: Diagnosis not present

## 2020-11-10 DIAGNOSIS — Z79899 Other long term (current) drug therapy: Secondary | ICD-10-CM | POA: Diagnosis not present

## 2020-11-10 DIAGNOSIS — I119 Hypertensive heart disease without heart failure: Secondary | ICD-10-CM | POA: Diagnosis not present

## 2020-11-10 DIAGNOSIS — D849 Immunodeficiency, unspecified: Secondary | ICD-10-CM | POA: Diagnosis not present

## 2020-11-10 DIAGNOSIS — G629 Polyneuropathy, unspecified: Secondary | ICD-10-CM | POA: Diagnosis not present

## 2020-11-10 DIAGNOSIS — I088 Other rheumatic multiple valve diseases: Secondary | ICD-10-CM | POA: Diagnosis not present

## 2020-11-10 DIAGNOSIS — Z48298 Encounter for aftercare following other organ transplant: Secondary | ICD-10-CM | POA: Diagnosis not present

## 2020-11-10 DIAGNOSIS — I10 Essential (primary) hypertension: Secondary | ICD-10-CM | POA: Diagnosis not present

## 2020-11-10 DIAGNOSIS — Z4821 Encounter for aftercare following heart transplant: Secondary | ICD-10-CM | POA: Diagnosis not present

## 2020-11-11 DIAGNOSIS — E1129 Type 2 diabetes mellitus with other diabetic kidney complication: Secondary | ICD-10-CM | POA: Diagnosis not present

## 2020-11-11 DIAGNOSIS — D631 Anemia in chronic kidney disease: Secondary | ICD-10-CM | POA: Diagnosis not present

## 2020-11-11 DIAGNOSIS — D509 Iron deficiency anemia, unspecified: Secondary | ICD-10-CM | POA: Diagnosis not present

## 2020-11-11 DIAGNOSIS — N2581 Secondary hyperparathyroidism of renal origin: Secondary | ICD-10-CM | POA: Diagnosis not present

## 2020-11-11 DIAGNOSIS — Z992 Dependence on renal dialysis: Secondary | ICD-10-CM | POA: Diagnosis not present

## 2020-11-11 DIAGNOSIS — N186 End stage renal disease: Secondary | ICD-10-CM | POA: Diagnosis not present

## 2020-11-14 DIAGNOSIS — Z992 Dependence on renal dialysis: Secondary | ICD-10-CM | POA: Diagnosis not present

## 2020-11-14 DIAGNOSIS — D509 Iron deficiency anemia, unspecified: Secondary | ICD-10-CM | POA: Diagnosis not present

## 2020-11-14 DIAGNOSIS — E1129 Type 2 diabetes mellitus with other diabetic kidney complication: Secondary | ICD-10-CM | POA: Diagnosis not present

## 2020-11-14 DIAGNOSIS — N186 End stage renal disease: Secondary | ICD-10-CM | POA: Diagnosis not present

## 2020-11-14 DIAGNOSIS — N2581 Secondary hyperparathyroidism of renal origin: Secondary | ICD-10-CM | POA: Diagnosis not present

## 2020-11-14 DIAGNOSIS — D631 Anemia in chronic kidney disease: Secondary | ICD-10-CM | POA: Diagnosis not present

## 2020-11-15 DIAGNOSIS — G629 Polyneuropathy, unspecified: Secondary | ICD-10-CM | POA: Diagnosis not present

## 2020-11-15 DIAGNOSIS — G894 Chronic pain syndrome: Secondary | ICD-10-CM | POA: Diagnosis not present

## 2020-11-15 DIAGNOSIS — M25559 Pain in unspecified hip: Secondary | ICD-10-CM | POA: Diagnosis not present

## 2020-11-15 DIAGNOSIS — M25569 Pain in unspecified knee: Secondary | ICD-10-CM | POA: Diagnosis not present

## 2020-11-15 DIAGNOSIS — Z79891 Long term (current) use of opiate analgesic: Secondary | ICD-10-CM | POA: Diagnosis not present

## 2020-11-16 DIAGNOSIS — Z992 Dependence on renal dialysis: Secondary | ICD-10-CM | POA: Diagnosis not present

## 2020-11-16 DIAGNOSIS — N2581 Secondary hyperparathyroidism of renal origin: Secondary | ICD-10-CM | POA: Diagnosis not present

## 2020-11-16 DIAGNOSIS — D509 Iron deficiency anemia, unspecified: Secondary | ICD-10-CM | POA: Diagnosis not present

## 2020-11-16 DIAGNOSIS — N186 End stage renal disease: Secondary | ICD-10-CM | POA: Diagnosis not present

## 2020-11-16 DIAGNOSIS — D631 Anemia in chronic kidney disease: Secondary | ICD-10-CM | POA: Diagnosis not present

## 2020-11-16 DIAGNOSIS — E1129 Type 2 diabetes mellitus with other diabetic kidney complication: Secondary | ICD-10-CM | POA: Diagnosis not present

## 2020-11-17 ENCOUNTER — Other Ambulatory Visit: Payer: Self-pay | Admitting: Physician Assistant

## 2020-11-17 DIAGNOSIS — M4800 Spinal stenosis, site unspecified: Secondary | ICD-10-CM

## 2020-11-18 DIAGNOSIS — D631 Anemia in chronic kidney disease: Secondary | ICD-10-CM | POA: Diagnosis not present

## 2020-11-18 DIAGNOSIS — N2581 Secondary hyperparathyroidism of renal origin: Secondary | ICD-10-CM | POA: Diagnosis not present

## 2020-11-18 DIAGNOSIS — D509 Iron deficiency anemia, unspecified: Secondary | ICD-10-CM | POA: Diagnosis not present

## 2020-11-18 DIAGNOSIS — N186 End stage renal disease: Secondary | ICD-10-CM | POA: Diagnosis not present

## 2020-11-18 DIAGNOSIS — Z992 Dependence on renal dialysis: Secondary | ICD-10-CM | POA: Diagnosis not present

## 2020-11-18 DIAGNOSIS — E1129 Type 2 diabetes mellitus with other diabetic kidney complication: Secondary | ICD-10-CM | POA: Diagnosis not present

## 2020-11-21 DIAGNOSIS — N186 End stage renal disease: Secondary | ICD-10-CM | POA: Diagnosis not present

## 2020-11-21 DIAGNOSIS — Z992 Dependence on renal dialysis: Secondary | ICD-10-CM | POA: Diagnosis not present

## 2020-11-21 DIAGNOSIS — E1129 Type 2 diabetes mellitus with other diabetic kidney complication: Secondary | ICD-10-CM | POA: Diagnosis not present

## 2020-11-21 DIAGNOSIS — D631 Anemia in chronic kidney disease: Secondary | ICD-10-CM | POA: Diagnosis not present

## 2020-11-21 DIAGNOSIS — D509 Iron deficiency anemia, unspecified: Secondary | ICD-10-CM | POA: Diagnosis not present

## 2020-11-21 DIAGNOSIS — N2581 Secondary hyperparathyroidism of renal origin: Secondary | ICD-10-CM | POA: Diagnosis not present

## 2020-11-22 DIAGNOSIS — Z941 Heart transplant status: Secondary | ICD-10-CM | POA: Diagnosis not present

## 2020-11-22 DIAGNOSIS — Z992 Dependence on renal dialysis: Secondary | ICD-10-CM | POA: Diagnosis not present

## 2020-11-22 DIAGNOSIS — E782 Mixed hyperlipidemia: Secondary | ICD-10-CM | POA: Diagnosis not present

## 2020-11-22 DIAGNOSIS — I10 Essential (primary) hypertension: Secondary | ICD-10-CM | POA: Diagnosis not present

## 2020-11-22 DIAGNOSIS — G629 Polyneuropathy, unspecified: Secondary | ICD-10-CM | POA: Diagnosis not present

## 2020-11-22 DIAGNOSIS — N185 Chronic kidney disease, stage 5: Secondary | ICD-10-CM | POA: Diagnosis not present

## 2020-11-22 DIAGNOSIS — E1122 Type 2 diabetes mellitus with diabetic chronic kidney disease: Secondary | ICD-10-CM | POA: Diagnosis not present

## 2020-11-22 DIAGNOSIS — E1143 Type 2 diabetes mellitus with diabetic autonomic (poly)neuropathy: Secondary | ICD-10-CM | POA: Diagnosis not present

## 2020-11-23 DIAGNOSIS — Z992 Dependence on renal dialysis: Secondary | ICD-10-CM | POA: Diagnosis not present

## 2020-11-23 DIAGNOSIS — N186 End stage renal disease: Secondary | ICD-10-CM | POA: Diagnosis not present

## 2020-11-23 DIAGNOSIS — E1129 Type 2 diabetes mellitus with other diabetic kidney complication: Secondary | ICD-10-CM | POA: Diagnosis not present

## 2020-11-23 DIAGNOSIS — N2581 Secondary hyperparathyroidism of renal origin: Secondary | ICD-10-CM | POA: Diagnosis not present

## 2020-11-23 DIAGNOSIS — D509 Iron deficiency anemia, unspecified: Secondary | ICD-10-CM | POA: Diagnosis not present

## 2020-11-23 DIAGNOSIS — D631 Anemia in chronic kidney disease: Secondary | ICD-10-CM | POA: Diagnosis not present

## 2020-11-25 DIAGNOSIS — N2581 Secondary hyperparathyroidism of renal origin: Secondary | ICD-10-CM | POA: Diagnosis not present

## 2020-11-25 DIAGNOSIS — D631 Anemia in chronic kidney disease: Secondary | ICD-10-CM | POA: Diagnosis not present

## 2020-11-25 DIAGNOSIS — T8620 Unspecified complication of heart transplant: Secondary | ICD-10-CM | POA: Diagnosis not present

## 2020-11-25 DIAGNOSIS — Z23 Encounter for immunization: Secondary | ICD-10-CM | POA: Diagnosis not present

## 2020-11-25 DIAGNOSIS — E1129 Type 2 diabetes mellitus with other diabetic kidney complication: Secondary | ICD-10-CM | POA: Diagnosis not present

## 2020-11-25 DIAGNOSIS — N186 End stage renal disease: Secondary | ICD-10-CM | POA: Diagnosis not present

## 2020-11-25 DIAGNOSIS — D509 Iron deficiency anemia, unspecified: Secondary | ICD-10-CM | POA: Diagnosis not present

## 2020-11-25 DIAGNOSIS — Z992 Dependence on renal dialysis: Secondary | ICD-10-CM | POA: Diagnosis not present

## 2020-11-28 DIAGNOSIS — D631 Anemia in chronic kidney disease: Secondary | ICD-10-CM | POA: Diagnosis not present

## 2020-11-28 DIAGNOSIS — E1129 Type 2 diabetes mellitus with other diabetic kidney complication: Secondary | ICD-10-CM | POA: Diagnosis not present

## 2020-11-28 DIAGNOSIS — Z992 Dependence on renal dialysis: Secondary | ICD-10-CM | POA: Diagnosis not present

## 2020-11-28 DIAGNOSIS — N2581 Secondary hyperparathyroidism of renal origin: Secondary | ICD-10-CM | POA: Diagnosis not present

## 2020-11-28 DIAGNOSIS — D509 Iron deficiency anemia, unspecified: Secondary | ICD-10-CM | POA: Diagnosis not present

## 2020-11-28 DIAGNOSIS — N186 End stage renal disease: Secondary | ICD-10-CM | POA: Diagnosis not present

## 2020-11-30 DIAGNOSIS — D509 Iron deficiency anemia, unspecified: Secondary | ICD-10-CM | POA: Diagnosis not present

## 2020-11-30 DIAGNOSIS — N2581 Secondary hyperparathyroidism of renal origin: Secondary | ICD-10-CM | POA: Diagnosis not present

## 2020-11-30 DIAGNOSIS — Z992 Dependence on renal dialysis: Secondary | ICD-10-CM | POA: Diagnosis not present

## 2020-11-30 DIAGNOSIS — E1129 Type 2 diabetes mellitus with other diabetic kidney complication: Secondary | ICD-10-CM | POA: Diagnosis not present

## 2020-11-30 DIAGNOSIS — D631 Anemia in chronic kidney disease: Secondary | ICD-10-CM | POA: Diagnosis not present

## 2020-11-30 DIAGNOSIS — N186 End stage renal disease: Secondary | ICD-10-CM | POA: Diagnosis not present

## 2020-12-02 DIAGNOSIS — Z992 Dependence on renal dialysis: Secondary | ICD-10-CM | POA: Diagnosis not present

## 2020-12-02 DIAGNOSIS — E1129 Type 2 diabetes mellitus with other diabetic kidney complication: Secondary | ICD-10-CM | POA: Diagnosis not present

## 2020-12-02 DIAGNOSIS — D631 Anemia in chronic kidney disease: Secondary | ICD-10-CM | POA: Diagnosis not present

## 2020-12-02 DIAGNOSIS — D509 Iron deficiency anemia, unspecified: Secondary | ICD-10-CM | POA: Diagnosis not present

## 2020-12-02 DIAGNOSIS — N186 End stage renal disease: Secondary | ICD-10-CM | POA: Diagnosis not present

## 2020-12-02 DIAGNOSIS — N2581 Secondary hyperparathyroidism of renal origin: Secondary | ICD-10-CM | POA: Diagnosis not present

## 2020-12-05 DIAGNOSIS — D509 Iron deficiency anemia, unspecified: Secondary | ICD-10-CM | POA: Diagnosis not present

## 2020-12-05 DIAGNOSIS — E1129 Type 2 diabetes mellitus with other diabetic kidney complication: Secondary | ICD-10-CM | POA: Diagnosis not present

## 2020-12-05 DIAGNOSIS — N186 End stage renal disease: Secondary | ICD-10-CM | POA: Diagnosis not present

## 2020-12-05 DIAGNOSIS — N2581 Secondary hyperparathyroidism of renal origin: Secondary | ICD-10-CM | POA: Diagnosis not present

## 2020-12-05 DIAGNOSIS — Z992 Dependence on renal dialysis: Secondary | ICD-10-CM | POA: Diagnosis not present

## 2020-12-05 DIAGNOSIS — D631 Anemia in chronic kidney disease: Secondary | ICD-10-CM | POA: Diagnosis not present

## 2020-12-06 ENCOUNTER — Ambulatory Visit: Payer: Medicare Other | Attending: Cardiology

## 2020-12-06 ENCOUNTER — Other Ambulatory Visit: Payer: Self-pay

## 2020-12-06 DIAGNOSIS — M545 Low back pain, unspecified: Secondary | ICD-10-CM | POA: Insufficient documentation

## 2020-12-06 DIAGNOSIS — G8929 Other chronic pain: Secondary | ICD-10-CM | POA: Diagnosis not present

## 2020-12-06 DIAGNOSIS — R2689 Other abnormalities of gait and mobility: Secondary | ICD-10-CM | POA: Insufficient documentation

## 2020-12-06 DIAGNOSIS — M6281 Muscle weakness (generalized): Secondary | ICD-10-CM | POA: Diagnosis not present

## 2020-12-06 DIAGNOSIS — R293 Abnormal posture: Secondary | ICD-10-CM | POA: Insufficient documentation

## 2020-12-06 DIAGNOSIS — R2681 Unsteadiness on feet: Secondary | ICD-10-CM | POA: Insufficient documentation

## 2020-12-06 DIAGNOSIS — Z9181 History of falling: Secondary | ICD-10-CM | POA: Insufficient documentation

## 2020-12-06 NOTE — Patient Instructions (Signed)
Access Code: [email or web address] URL: [email or web address] Date: 12/06/2020 Prepared by: Sherlynn Stalls  Exercises Seated Long Arc Quad - 1 x daily - 5 x weekly - 3 sets - 10 reps Seated March - 1 x daily - 5 x weekly - 3 sets - 8 reps Sit to Stand with Counter Support - 1 x daily - 5 x weekly - 3 sets - 8 reps Side Stepping with Counter Support - 1 x daily - 5 x weekly - 3 sets - 8 reps Seated Lumbar Flexion Stretch - 1 x daily - 7 x weekly - 10 reps - 10 seconds hold Supine Lower Trunk Rotation - 1 x daily - 7 x weekly - 10 reps - 5 seconds hold

## 2020-12-06 NOTE — Therapy (Signed)
[facility] [place], [place], [address] Phone: [phone]   Fax:  [phone]  Physical Therapy Evaluation  Patient Details  Name: [name] MRN: [id number] Date of Birth: Feb 15, 1961 Referring Provider (PT): Tylene Fantasia MD - [facility]   Encounter Date: 12/06/2020   PT End of Session - 12/06/20 0921    Visit Number 1    Number of Visits 17    Date for PT Re-Evaluation 02/07/21    Authorization Type Medicare & UHC    PT Start Time 0845    PT Stop Time 0930    PT Time Calculation (min) 45 min    Activity Tolerance Patient tolerated treatment well;Patient limited by fatigue;Patient limited by pain    Behavior During Therapy [facility] for tasks assessed/performed           Past Medical History:  Diagnosis Date  . Arthritis   . Atrial fibrillation ([place])   . CHF (congestive heart failure), NYHA class III (HCC)    s/p heart transplant  . Chronic kidney disease    end stage  . Chronic systolic dysfunction of left ventricle   . CVA (cerebral infarction)   . Diabetes mellitus    PMH; Prior to heart transplant  . Family history of coronary artery disease    in both parents  . GERD (gastroesophageal reflux disease)   . Hyperlipidemia   . Hypertension   . Left bundle branch block   . Morbid obesity ([place])    status post lap band  . Myocardial infarction [facility])    prior to heart transplant  . Nonischemic cardiomyopathy ([place])    prior to heart transplant  . Obesity (BMI 30-39.9)   . Obstructive sleep apnea    no longer needs CPAP after heart transplant per pt  . Premature ventricular contractions   . Prostate cancer ([place])   . SOB (shortness of breath)     Past Surgical History:  Procedure Laterality Date  . CARDIAC PACEMAKER PLACEMENT  09/21/2009   Biventricular implantable cardioverter-defibrillator implantation     . COLONOSCOPY W/ BIOPSIES AND POLYPECTOMY    . FOOT SURGERY     left   . HEART TRANSPLANT  2014  . LAPAROSCOPIC GASTRIC BANDING  01/27/2007  . LEFT VENTRICULAR ASSIST DEVICE     implant[facility] at [facility]  . PROSTATE BIOPSY    . TOTAL HIP ARTHROPLASTY Right 07/22/2017   Procedure: RIGHT TOTAL HIP ARTHROPLASTY ANTERIOR APPROACH;  Surgeon: Rod Can, MD;  Location: [place];  Service: Orthopedics;  Laterality: Right;  Needs RNFA  . TOTAL KNEE ARTHROPLASTY Right 07/22/2017    There were no vitals filed for this visit.    Subjective Assessment - 12/06/20 0847    Subjective Had heart transplant 6 years ago (on LVAD previous to this at which time had a stroke), currently working towards getting kidney transplant after complications from heart transplant. Working with transplant teams at [facility]. Reports he needs to improve conditioning, heart strength and physical strength to be eligible for the kidney transplant. Reports stamina has decreased since heart transplant. Also getting low back pain with pain into feet. Currently getting dialysis M/W/F    Pertinent History heart transplant 6 yrs ago, ESRD, prostate CA, OSA, obesity, CHF, GERD, had DM, CVA with residual left hand weakness,  peripheral neuropathy that started about 6 months ago,  R THA, AFib    Patient Stated Goals to improve strength, stamina    Currently  in Pain? Yes    Pain Score 0-No pain   starts 15-30 minutes into walking   Pain Location Back    Pain Orientation Right;Left    Pain Radiating Towards both feet    Aggravating Factors  walking or standing > 15 minutes              [facility] PT Assessment - 12/06/20 0852      Assessment   Medical Diagnosis Aftercare following organ transplant, Physical Deconditioning, Decreased strength    Referring Provider (PT) Devore, Rhodia Albright MD - [facility]    Hand Dominance Right    Prior Therapy recalls possible therapy after heart transplant      Precautions   Precautions Fall   6 yr s/p heart transplant, also with kidney disease - monitor VS    Precaution Comments Dialysis port on the right UE     Balance Screen   Has the patient fallen in the past 6 months Yes    How many times? 1 - due to low blood pressure, neuropathy per pt report    Has the patient had a decrease in activity level because of a fear of falling?  Yes    Is the patient reluctant to leave their home because of a fear of falling?  Yes      Prior Function   Level of Independence Independent    Vocation Requirements Retired - worked for city of [employer]   Overall Cognitive Status Within Functional Limits for tasks assessed      Posture/Postural Control   Posture Comments forward flexed with slight forward head, downward gaze. in standing and sitting.      ROM / Strength   AROM / PROM / Strength AROM;Strength      AROM   Overall AROM Comments Lumbar flex 75%, ext 50% (both relieve pain). Lateral flexion to side of knee B with incr discomfort.      Strength   Overall Strength Comments Hip flex L 3+/5, R 4-/5. Knee ext L 4/5, R 4+/5. Knee flex B 4/5.      Transfers   Five time sit to stand comments  unable w/o hands. 21 seconds with BUE assist, maintains forward flex posture      Ambulation/Gait   Gait Pattern Decreased arm swing - right;Decreased arm swing - left;Decreased stance time - right;Decreased stance time - left;Decreased stride length;Decreased hip/knee flexion - right;Decreased hip/knee flexion - left;Trendelenburg;Antalgic;Decreased trunk rotation;Trunk flexed   Asymmetrical step through   Gait velocity diminished    Stairs --   to be assessed     6 Minute Walk- Baseline   6 Minute Walk- Baseline yes    BP (mmHg) 122/78    HR (bpm) 82    02 Sat (%RA) 96 %      6 Minute walk- Post Test   6 Minute Walk Post Test yes    BP (mmHg) 155/91    HR (bpm) 92    Modified Borg Scale for Dyspnea --    Perceived Rate of Exertion (Borg) 14-      6 minute walk test results    Aerobic Endurance Distance Walked --   560 ft or 170.688 meters  (Norm: 43 m for males 55-69 yo)   Endurance additional comments 3:35 requriring seated rest- test terminated limited by fatigue and LBP into legs      Standardized Balance Assessment   Standardized Balance Assessment Timed Up and Go  Test;Berg Balance Test;Five Times Sit to Stand      Berg Balance Test   Sit to Stand Able to stand  independently using hands    Standing Unsupported Able to stand 2 minutes with supervision    Sitting with Back Unsupported but Feet Supported on Floor or Stool Able to sit safely and securely 2 minutes    Stand to Sit Controls descent by using hands    Transfers Able to transfer safely, definite need of hands    Standing Unsupported with Eyes Closed Able to stand 3 seconds   needs supervision   Standing Unsupported with Feet Together Able to place feet together independently but unable to hold for 30 seconds    From Standing, Reach Forward with Outstretched Arm Can reach forward >12 cm safely (5")    From Standing Position, Pick up Object from Floor Able to pick up shoe, needs supervision    From Standing Position, Turn to Look Behind Over each Shoulder Needs supervision when turning    Turn 360 Degrees Able to turn 360 degrees safely but slowly   10 short sliding steps to complete   Standing Unsupported, Alternately Place Feet on Step/Stool Able to stand independently and complete 8 steps >20 seconds    Standing Unsupported, One Foot in [employer] balance while stepping or standing    Standing on One Leg Unable to try or needs assist to prevent fall    Total Score 32    Berg comment: High risk of falls      Timed Up and Go Test   Normal TUG (seconds) 13            BP end of session WNL: 123/81, HR 83 bpm, O2 96%.           Objective measurements completed on examination: See above findings.               PT Education - 12/06/20 1229    Education Details Initial PT POC, HEP. Access Code: [email or web address]    Person(s) Educated Patient     Methods Explanation;Demonstration;Handout    Comprehension Verbalized understanding;Returned demonstration            PT Short Term Goals - 12/06/20 1022      PT SHORT TERM GOAL #1   Title Independent with initial HEP    Time 2    Period Weeks    Status New    Target Date 12/20/20             PT Long Term Goals - 12/06/20 1022      PT LONG TERM GOAL #1   Title Berg improved to at least 50/56 to demonstrate decreased risk of falls    Baseline 32/56 - high fall risk    Time 8    Period Weeks    Status New    Target Date 02/07/21      PT LONG TERM GOAL #2   Title 5TSTS without UE support < 12 seconds to demonstrate improved functional LE strength    Baseline Unable to complete without use of hands, able to complete within 21 seconds using BUE assistance    Time 8    Period Weeks    Status New    Target Date 02/07/21      PT LONG TERM GOAL #3   Title 6MWT distancce traveled to at least 500 m to demonstrate improved aerobic capacity and endurance    Time 8    Period  Weeks    Status New    Target Date 02/07/21      PT LONG TERM GOAL #4   Title Overall gait with improved symmetry, improved tolerance to prolonged walking with </= 2/10 LBP to facilitate safe participation in grocery shopping and community activities.    Time 8    Period Weeks    Status New    Target Date 02/07/21      PT LONG TERM GOAL #5   Title BLE strength at least 4+/5 with </= 2/10 LBP    Time 8    Period Weeks    Status New    Target Date 02/07/21                  Plan - 12/06/20 1230    Clinical Impression Statement Pt is a 60 yo male who was referred for physical therapy for physical deconditioning, decreased strength. He had a heart transplant 6 years ago and reports need for kidney transplant, needs to improve strength and mobility in order to continue with process. Gets dialysis 3d/wk. PMH is also significant for low back pain down into legs in addition to peripheral  neuropathy.  He presents with general decreased endurance, further limited by low back pain and radiating pain into legs. He also presents with abnormal posture, decreased lumbar ROM, LE muscle tightness, decreased  general strength, decreased balance, and abnormal gait. He presents as a falls risk at this time given score on the [place]. Cardiovascular endurance is also significantly limited as evidenced by distance traveled on the 6MWT (terminated at 3:35 minutes - required seated rest for recovery and with increasing pain).  He will benefit from skilled PT to work on improving overall functional strength and mobility, decreasing LBP and decreasing falls risk.    Personal Factors and Comorbidities Comorbidity 3+    Stability/Clinical Decision Making Evolving/Moderate complexity    Clinical Decision Making Moderate    Rehab Potential Good    PT Frequency 2x / week    PT Duration 8 weeks    PT Treatment/Interventions ADLs/Self Care Home Management;Electrical Stimulation;Moist Heat;Cryotherapy;Gait training;Neuromuscular re-education;Balance training;Therapeutic exercise;Therapeutic activities;Functional mobility training;Patient/family education;Manual techniques;Energy conservation;Vestibular    PT Next Visit Plan Reassess HEP. Ask about home setup (stairs? assess as needed). Progress general strength, aerobic capacity/endurance with VS monitoring(**do not take BP on Arm with dialysis port**), balance as tolerated. May incorporate some LE/back flexibility. Manual/modalities for pain mgmt as needed.    PT Home Exercise Plan see pt edu    Consulted and Agree with Plan of Care Patient           Patient will benefit from skilled therapeutic intervention in order to improve the following deficits and impairments:  Abnormal gait,Decreased range of motion,Difficulty walking,Increased muscle spasms,Decreased endurance,Cardiopulmonary status limiting activity,Pain,Impaired flexibility,Improper body  mechanics,Decreased balance,Decreased mobility,Decreased strength,Impaired sensation  Visit Diagnosis: Muscle weakness (generalized) - Plan: PT plan of care cert/re-cert  Abnormal posture - Plan: PT plan of care cert/re-cert  Other abnormalities of gait and mobility - Plan: PT plan of care cert/re-cert  Unsteadiness on feet - Plan: PT plan of care cert/re-cert  Chronic low back pain, unspecified back pain laterality, unspecified whether sciatica present - Plan: PT plan of care cert/re-cert  History of falling - Plan: PT plan of care cert/re-cert     Problem List Patient Active Problem List   Diagnosis Date Noted  . Morbid obesity ([place]) 11/19/2019  . Sleep disorder 10/22/2019  . Sleep disturbance 10/22/2019  . Abnormality  of gait 06/09/2019  . Diabetic polyneuropathy associated with diabetes mellitus due to underlying condition ([place]) 04/14/2019  . Peripheral neuropathy 12/11/2018  . CVA (cerebral vascular accident) ([place]) 11/09/2018  . H/O heart transplant ([place]) 11/09/2018  . Malignant neoplasm of prostate ([place]) 05/06/2018  . Closed posterior dislocation of hip, right, initial encounter ([place]) 08/24/2017  . End stage renal disease on dialysis ([place]) 08/24/2017  . Hip dislocation, right ([place]) 08/24/2017  . Osteoarthritis of right hip 07/22/2017  . VENTRICULAR TACHYCARDIA 12/21/2009  . Dyslipidemia 08/30/2009  . Essential hypertension 08/30/2009  . ISCHEMIC CARDIOMYOPATHY 08/30/2009  . Atrial fibrillation ([place]) 08/30/2009  . VENTRICULAR ECTOPY 08/30/2009  . CEREBROVASCULAR ACCIDENT, HX OF 08/30/2009  . Obesity, Class III, BMI 40-49.9 (morbid obesity) ([place]) 08/30/2009  . Diabetes mellitus type 2 in obese ([place]) 10/17/2007  . OBSTRUCTIVE SLEEP APNEA 10/17/2007  . CARDIOMYOPATHY, DILATED 10/17/2007    Hall Busing, PT, DPT 12/06/2020, 10:16 PM  [facility] [place], [place], [address] Phone: [phone]    Fax:  [phone]  Name: [name] MRN: [id number] Date of Birth: 03-16-1961

## 2020-12-07 DIAGNOSIS — D631 Anemia in chronic kidney disease: Secondary | ICD-10-CM | POA: Diagnosis not present

## 2020-12-07 DIAGNOSIS — D509 Iron deficiency anemia, unspecified: Secondary | ICD-10-CM | POA: Diagnosis not present

## 2020-12-07 DIAGNOSIS — N2581 Secondary hyperparathyroidism of renal origin: Secondary | ICD-10-CM | POA: Diagnosis not present

## 2020-12-07 DIAGNOSIS — N186 End stage renal disease: Secondary | ICD-10-CM | POA: Diagnosis not present

## 2020-12-07 DIAGNOSIS — E1129 Type 2 diabetes mellitus with other diabetic kidney complication: Secondary | ICD-10-CM | POA: Diagnosis not present

## 2020-12-07 DIAGNOSIS — Z992 Dependence on renal dialysis: Secondary | ICD-10-CM | POA: Diagnosis not present

## 2020-12-09 DIAGNOSIS — D509 Iron deficiency anemia, unspecified: Secondary | ICD-10-CM | POA: Diagnosis not present

## 2020-12-09 DIAGNOSIS — N2581 Secondary hyperparathyroidism of renal origin: Secondary | ICD-10-CM | POA: Diagnosis not present

## 2020-12-09 DIAGNOSIS — E1129 Type 2 diabetes mellitus with other diabetic kidney complication: Secondary | ICD-10-CM | POA: Diagnosis not present

## 2020-12-09 DIAGNOSIS — D631 Anemia in chronic kidney disease: Secondary | ICD-10-CM | POA: Diagnosis not present

## 2020-12-09 DIAGNOSIS — Z992 Dependence on renal dialysis: Secondary | ICD-10-CM | POA: Diagnosis not present

## 2020-12-09 DIAGNOSIS — N186 End stage renal disease: Secondary | ICD-10-CM | POA: Diagnosis not present

## 2020-12-12 DIAGNOSIS — D631 Anemia in chronic kidney disease: Secondary | ICD-10-CM | POA: Diagnosis not present

## 2020-12-12 DIAGNOSIS — E1129 Type 2 diabetes mellitus with other diabetic kidney complication: Secondary | ICD-10-CM | POA: Diagnosis not present

## 2020-12-12 DIAGNOSIS — N186 End stage renal disease: Secondary | ICD-10-CM | POA: Diagnosis not present

## 2020-12-12 DIAGNOSIS — N2581 Secondary hyperparathyroidism of renal origin: Secondary | ICD-10-CM | POA: Diagnosis not present

## 2020-12-12 DIAGNOSIS — D509 Iron deficiency anemia, unspecified: Secondary | ICD-10-CM | POA: Diagnosis not present

## 2020-12-12 DIAGNOSIS — Z992 Dependence on renal dialysis: Secondary | ICD-10-CM | POA: Diagnosis not present

## 2020-12-14 DIAGNOSIS — D631 Anemia in chronic kidney disease: Secondary | ICD-10-CM | POA: Diagnosis not present

## 2020-12-14 DIAGNOSIS — N2581 Secondary hyperparathyroidism of renal origin: Secondary | ICD-10-CM | POA: Diagnosis not present

## 2020-12-14 DIAGNOSIS — D509 Iron deficiency anemia, unspecified: Secondary | ICD-10-CM | POA: Diagnosis not present

## 2020-12-14 DIAGNOSIS — N186 End stage renal disease: Secondary | ICD-10-CM | POA: Diagnosis not present

## 2020-12-14 DIAGNOSIS — Z992 Dependence on renal dialysis: Secondary | ICD-10-CM | POA: Diagnosis not present

## 2020-12-14 DIAGNOSIS — E1129 Type 2 diabetes mellitus with other diabetic kidney complication: Secondary | ICD-10-CM | POA: Diagnosis not present

## 2020-12-15 ENCOUNTER — Ambulatory Visit
Admission: RE | Admit: 2020-12-15 | Discharge: 2020-12-15 | Disposition: A | Discharge status: Home / Self Care | Payer: Medicare Other | Source: Ambulatory Visit | Attending: Physician Assistant | Admitting: Physician Assistant

## 2020-12-15 ENCOUNTER — Encounter: Payer: Self-pay | Admitting: Physical Therapy

## 2020-12-15 ENCOUNTER — Other Ambulatory Visit: Payer: Self-pay

## 2020-12-15 ENCOUNTER — Ambulatory Visit: Payer: Medicare Other | Admitting: Physical Therapy

## 2020-12-15 DIAGNOSIS — R293 Abnormal posture: Secondary | ICD-10-CM

## 2020-12-15 DIAGNOSIS — G8929 Other chronic pain: Secondary | ICD-10-CM | POA: Diagnosis not present

## 2020-12-15 DIAGNOSIS — R2681 Unsteadiness on feet: Secondary | ICD-10-CM | POA: Diagnosis not present

## 2020-12-15 DIAGNOSIS — M6281 Muscle weakness (generalized): Secondary | ICD-10-CM | POA: Diagnosis not present

## 2020-12-15 DIAGNOSIS — Z9181 History of falling: Secondary | ICD-10-CM

## 2020-12-15 DIAGNOSIS — R2689 Other abnormalities of gait and mobility: Secondary | ICD-10-CM | POA: Diagnosis not present

## 2020-12-15 DIAGNOSIS — M48061 Spinal stenosis, lumbar region without neurogenic claudication: Secondary | ICD-10-CM | POA: Diagnosis not present

## 2020-12-15 DIAGNOSIS — M4800 Spinal stenosis, site unspecified: Secondary | ICD-10-CM

## 2020-12-15 DIAGNOSIS — M545 Low back pain, unspecified: Secondary | ICD-10-CM | POA: Diagnosis not present

## 2020-12-15 NOTE — Therapy (Signed)
[place]. [place], [place], [address] Phone: [phone]   Fax:  [phone]  Physical Therapy Treatment  Patient Details  Name: LANGSTON TUBERVILLE MRN: [id number] Date of Birth: 1960-10-07 Referring Provider (PT): Tylene Fantasia MD - [facility]   Encounter Date: 12/15/2020   PT End of Session - 12/15/20 1139    Visit Number 2    Number of Visits 17    Date for PT Re-Evaluation 02/07/21    PT Start Time 1057    PT Stop Time 1139    PT Time Calculation (min) 42 min    Activity Tolerance Patient tolerated treatment well;Patient limited by fatigue;Patient limited by pain    Behavior During Therapy [facility] for tasks assessed/performed           Past Medical History:  Diagnosis Date  . Arthritis   . Atrial fibrillation ([place])   . CHF (congestive heart failure), NYHA class III (HCC)    s/p heart transplant  . Chronic kidney disease    end stage  . Chronic systolic dysfunction of left ventricle   . CVA (cerebral infarction)   . Diabetes mellitus    PMH; Prior to heart transplant  . Family history of coronary artery disease    in both parents  . GERD (gastroesophageal reflux disease)   . Hyperlipidemia   . Hypertension   . Left bundle branch block   . Morbid obesity ([place])    status post lap band  . Myocardial infarction [facility])    prior to heart transplant  . Nonischemic cardiomyopathy ([place])    prior to heart transplant  . Obesity (BMI 30-39.9)   . Obstructive sleep apnea    no longer needs CPAP after heart transplant per pt  . Premature ventricular contractions   . Prostate cancer ([place])   . SOB (shortness of breath)     Past Surgical History:  Procedure Laterality Date  . CARDIAC PACEMAKER PLACEMENT  09/21/2009   Biventricular implantable cardioverter-defibrillator implantation     . COLONOSCOPY W/ BIOPSIES AND POLYPECTOMY    . FOOT SURGERY     left  . HEART TRANSPLANT  2014  .  LAPAROSCOPIC GASTRIC BANDING  01/27/2007  . LEFT VENTRICULAR ASSIST DEVICE     implanted at [facility]  . PROSTATE BIOPSY    . TOTAL HIP ARTHROPLASTY Right 07/22/2017   Procedure: RIGHT TOTAL HIP ARTHROPLASTY ANTERIOR APPROACH;  Surgeon: Rod Can, MD;  Location: [place];  Service: Orthopedics;  Laterality: Right;  Needs RNFA  . TOTAL KNEE ARTHROPLASTY Right 07/22/2017    There were no vitals filed for this visit.   Subjective Assessment - 12/15/20 1105    Subjective Pt reports that ex's are going well; had MRI this morning, does not have results yet.    Currently in Pain? Yes    Pain Score 5     Pain Location Back                             [facility] Adult PT Treatment/Exercise - 12/15/20 0001      Exercises   Exercises Lumbar;Knee/Hip      Lumbar Exercises: Aerobic   Recumbent Bike L2 x 6 min    Nustep L5 x 6 min      Lumbar Exercises: Seated   Sit to Stand 20 reps   2x10 from raised mat table no UEs; 2nd set with yellow  ball chest press     Knee/Hip Exercises: Standing   Heel Raises Both;1 set;10 reps    Heel Raises Limitations 3#    Knee Flexion Both;1 set;10 reps    Knee Flexion Limitations 3#    Hip Flexion Both;1 set;10 reps    Hip Flexion Limitations 3#    Hip Abduction Both;1 set;10 reps    Abduction Limitations 3#    Hip Extension Both;1 set;10 reps    Extension Limitations 3#      Knee/Hip Exercises: Seated   Long Arc Quad Both;2 sets;10 reps    Long Arc Quad Weight 3 lbs.    Knee/Hip Flexion 2x10 red TB    Marching Both;2 sets;10 reps    Marching Weights 3 lbs.                    PT Short Term Goals - 12/15/20 1141      PT SHORT TERM GOAL #1   Title Independent with initial HEP    Time 2    Period Weeks    Status Achieved    Target Date 12/20/20             PT Long Term Goals - 12/06/20 1022      PT LONG TERM GOAL #1   Title Berg improved to at least 50/56 to demonstrate decreased risk of falls    Baseline 32/56  - high fall risk    Time 8    Period Weeks    Status New    Target Date 02/07/21      PT LONG TERM GOAL #2   Title 5TSTS without UE support < 12 seconds to demonstrate improved functional LE strength    Baseline Unable to complete without use of hands, able to complete within 21 seconds using BUE assistance    Time 8    Period Weeks    Status New    Target Date 02/07/21      PT LONG TERM GOAL #3   Title 6MWT distancce traveled to at least 500 m to demonstrate improved aerobic capacity and endurance    Time 8    Period Weeks    Status New    Target Date 02/07/21      PT LONG TERM GOAL #4   Title Overall gait with improved symmetry, improved tolerance to prolonged walking with </= 2/10 LBP to facilitate safe participation in grocery shopping and community activities.    Time 8    Period Weeks    Status New    Target Date 02/07/21      PT LONG TERM GOAL #5   Title BLE strength at least 4+/5 with </= 2/10 LBP    Time 8    Period Weeks    Status New    Target Date 02/07/21                 Plan - 12/15/20 1139    Clinical Impression Statement Pt tolerated progression to TE fairly well; did require freqent rest breaks esp following standing ex's d/t fatigue. Cues to decrease compensations with standing hip ex's. Continue to progress functional strengthening and endurance ex's to tolerance. Begin incoporating balance/gait training.    PT Treatment/Interventions ADLs/Self Care Home Management;Electrical Stimulation;Moist Heat;Cryotherapy;Gait training;Neuromuscular re-education;Balance training;Therapeutic exercise;Therapeutic activities;Functional mobility training;Patient/family education;Manual techniques;Energy conservation;Vestibular    PT Next Visit Plan Progress general strength, aerobic capacity/endurance with VS monitoring, balance as tolerated. May incorporate some LE/back flexibility. Manual/modalities for pain mgmt as  needed.    Consulted and Agree with Plan of Care  Patient           Patient will benefit from skilled therapeutic intervention in order to improve the following deficits and impairments:  Abnormal gait,Decreased range of motion,Difficulty walking,Increased muscle spasms,Decreased endurance,Cardiopulmonary status limiting activity,Pain,Impaired flexibility,Improper body mechanics,Decreased balance,Decreased mobility,Decreased strength,Impaired sensation  Visit Diagnosis: Abnormal posture  Muscle weakness (generalized)  Other abnormalities of gait and mobility  Unsteadiness on feet  Chronic low back pain, unspecified back pain laterality, unspecified whether sciatica present  History of falling     Problem List Patient Active Problem List   Diagnosis Date Noted  . Morbid obesity ([place]) 11/19/2019  . Sleep disorder 10/22/2019  . Sleep disturbance 10/22/2019  . Abnormality of gait 06/09/2019  . Diabetic polyneuropathy associated with diabetes mellitus due to underlying condition ([place]) 04/14/2019  . Peripheral neuropathy 12/11/2018  . CVA (cerebral vascular accident) ([place]) 11/09/2018  . H/O heart transplant ([place]) 11/09/2018  . Malignant neoplasm of prostate ([place]) 05/06/2018  . Closed posterior dislocation of hip, right, initial encounter ([place]) 08/24/2017  . End stage renal disease on dialysis ([place]) 08/24/2017  . Hip dislocation, right ([place]) 08/24/2017  . Osteoarthritis of right hip 07/22/2017  . VENTRICULAR TACHYCARDIA 12/21/2009  . Dyslipidemia 08/30/2009  . Essential hypertension 08/30/2009  . ISCHEMIC CARDIOMYOPATHY 08/30/2009  . Atrial fibrillation ([place]) 08/30/2009  . VENTRICULAR ECTOPY 08/30/2009  . CEREBROVASCULAR ACCIDENT, HX OF 08/30/2009  . Obesity, Class III, BMI 40-49.9 (morbid obesity) ([name]) 08/30/2009  . Diabetes mellitus type 2 in obese ([place]) 10/17/2007  . OBSTRUCTIVE SLEEP APNEA 10/17/2007  . CARDIOMYOPATHY, DILATED 10/17/2007   Amador Cunas, PT, DPT Donald Prose [facility] [place] 12/15/2020, 11:41 AM  [place]. [place], [place], [address] Phone: [phone]   Fax:  [phone]  Name: DEONTAYE CIVELLO MRN: [id number] Date of Birth: 1960/10/30

## 2020-12-16 DIAGNOSIS — N2581 Secondary hyperparathyroidism of renal origin: Secondary | ICD-10-CM | POA: Diagnosis not present

## 2020-12-16 DIAGNOSIS — D509 Iron deficiency anemia, unspecified: Secondary | ICD-10-CM | POA: Diagnosis not present

## 2020-12-16 DIAGNOSIS — N186 End stage renal disease: Secondary | ICD-10-CM | POA: Diagnosis not present

## 2020-12-16 DIAGNOSIS — E1129 Type 2 diabetes mellitus with other diabetic kidney complication: Secondary | ICD-10-CM | POA: Diagnosis not present

## 2020-12-16 DIAGNOSIS — D631 Anemia in chronic kidney disease: Secondary | ICD-10-CM | POA: Diagnosis not present

## 2020-12-16 DIAGNOSIS — Z992 Dependence on renal dialysis: Secondary | ICD-10-CM | POA: Diagnosis not present

## 2020-12-19 DIAGNOSIS — N2581 Secondary hyperparathyroidism of renal origin: Secondary | ICD-10-CM | POA: Diagnosis not present

## 2020-12-19 DIAGNOSIS — Z992 Dependence on renal dialysis: Secondary | ICD-10-CM | POA: Diagnosis not present

## 2020-12-19 DIAGNOSIS — D509 Iron deficiency anemia, unspecified: Secondary | ICD-10-CM | POA: Diagnosis not present

## 2020-12-19 DIAGNOSIS — E1129 Type 2 diabetes mellitus with other diabetic kidney complication: Secondary | ICD-10-CM | POA: Diagnosis not present

## 2020-12-19 DIAGNOSIS — D631 Anemia in chronic kidney disease: Secondary | ICD-10-CM | POA: Diagnosis not present

## 2020-12-19 DIAGNOSIS — N186 End stage renal disease: Secondary | ICD-10-CM | POA: Diagnosis not present

## 2020-12-20 DIAGNOSIS — Z20822 Contact with and (suspected) exposure to covid-19: Secondary | ICD-10-CM | POA: Diagnosis not present

## 2020-12-20 DIAGNOSIS — U071 COVID-19: Secondary | ICD-10-CM | POA: Diagnosis not present

## 2020-12-21 DIAGNOSIS — Z992 Dependence on renal dialysis: Secondary | ICD-10-CM | POA: Diagnosis not present

## 2020-12-21 DIAGNOSIS — D509 Iron deficiency anemia, unspecified: Secondary | ICD-10-CM | POA: Diagnosis not present

## 2020-12-21 DIAGNOSIS — D631 Anemia in chronic kidney disease: Secondary | ICD-10-CM | POA: Diagnosis not present

## 2020-12-21 DIAGNOSIS — U071 COVID-19: Secondary | ICD-10-CM | POA: Diagnosis not present

## 2020-12-21 DIAGNOSIS — N2581 Secondary hyperparathyroidism of renal origin: Secondary | ICD-10-CM | POA: Diagnosis not present

## 2020-12-21 DIAGNOSIS — E1129 Type 2 diabetes mellitus with other diabetic kidney complication: Secondary | ICD-10-CM | POA: Diagnosis not present

## 2020-12-21 DIAGNOSIS — N186 End stage renal disease: Secondary | ICD-10-CM | POA: Diagnosis not present

## 2020-12-22 ENCOUNTER — Ambulatory Visit: Payer: Medicare Other | Admitting: Physical Therapy

## 2020-12-23 DIAGNOSIS — D509 Iron deficiency anemia, unspecified: Secondary | ICD-10-CM | POA: Diagnosis not present

## 2020-12-23 DIAGNOSIS — U071 COVID-19: Secondary | ICD-10-CM | POA: Diagnosis not present

## 2020-12-23 DIAGNOSIS — N186 End stage renal disease: Secondary | ICD-10-CM | POA: Diagnosis not present

## 2020-12-23 DIAGNOSIS — Z992 Dependence on renal dialysis: Secondary | ICD-10-CM | POA: Diagnosis not present

## 2020-12-23 DIAGNOSIS — N2581 Secondary hyperparathyroidism of renal origin: Secondary | ICD-10-CM | POA: Diagnosis not present

## 2020-12-23 DIAGNOSIS — E1129 Type 2 diabetes mellitus with other diabetic kidney complication: Secondary | ICD-10-CM | POA: Diagnosis not present

## 2020-12-23 DIAGNOSIS — D631 Anemia in chronic kidney disease: Secondary | ICD-10-CM | POA: Diagnosis not present

## 2020-12-25 DIAGNOSIS — T8620 Unspecified complication of heart transplant: Secondary | ICD-10-CM | POA: Diagnosis not present

## 2020-12-25 DIAGNOSIS — Z992 Dependence on renal dialysis: Secondary | ICD-10-CM | POA: Diagnosis not present

## 2020-12-25 DIAGNOSIS — N186 End stage renal disease: Secondary | ICD-10-CM | POA: Diagnosis not present

## 2020-12-26 DIAGNOSIS — Z23 Encounter for immunization: Secondary | ICD-10-CM | POA: Diagnosis not present

## 2020-12-26 DIAGNOSIS — Z992 Dependence on renal dialysis: Secondary | ICD-10-CM | POA: Diagnosis not present

## 2020-12-26 DIAGNOSIS — D509 Iron deficiency anemia, unspecified: Secondary | ICD-10-CM | POA: Diagnosis not present

## 2020-12-26 DIAGNOSIS — U071 COVID-19: Secondary | ICD-10-CM | POA: Diagnosis not present

## 2020-12-26 DIAGNOSIS — N2581 Secondary hyperparathyroidism of renal origin: Secondary | ICD-10-CM | POA: Diagnosis not present

## 2020-12-26 DIAGNOSIS — N186 End stage renal disease: Secondary | ICD-10-CM | POA: Diagnosis not present

## 2020-12-26 DIAGNOSIS — E1129 Type 2 diabetes mellitus with other diabetic kidney complication: Secondary | ICD-10-CM | POA: Diagnosis not present

## 2020-12-26 DIAGNOSIS — D631 Anemia in chronic kidney disease: Secondary | ICD-10-CM | POA: Diagnosis not present

## 2020-12-27 ENCOUNTER — Ambulatory Visit: Payer: Medicare Other | Admitting: Physical Therapy

## 2020-12-28 DIAGNOSIS — E1129 Type 2 diabetes mellitus with other diabetic kidney complication: Secondary | ICD-10-CM | POA: Diagnosis not present

## 2020-12-28 DIAGNOSIS — D631 Anemia in chronic kidney disease: Secondary | ICD-10-CM | POA: Diagnosis not present

## 2020-12-28 DIAGNOSIS — Z992 Dependence on renal dialysis: Secondary | ICD-10-CM | POA: Diagnosis not present

## 2020-12-28 DIAGNOSIS — N2581 Secondary hyperparathyroidism of renal origin: Secondary | ICD-10-CM | POA: Diagnosis not present

## 2020-12-28 DIAGNOSIS — D509 Iron deficiency anemia, unspecified: Secondary | ICD-10-CM | POA: Diagnosis not present

## 2020-12-28 DIAGNOSIS — U071 COVID-19: Secondary | ICD-10-CM | POA: Diagnosis not present

## 2020-12-28 DIAGNOSIS — N186 End stage renal disease: Secondary | ICD-10-CM | POA: Diagnosis not present

## 2020-12-29 ENCOUNTER — Ambulatory Visit: Payer: Medicare Other | Attending: Cardiology

## 2020-12-29 DIAGNOSIS — Z9181 History of falling: Secondary | ICD-10-CM | POA: Insufficient documentation

## 2020-12-29 DIAGNOSIS — R2689 Other abnormalities of gait and mobility: Secondary | ICD-10-CM | POA: Insufficient documentation

## 2020-12-29 DIAGNOSIS — R2681 Unsteadiness on feet: Secondary | ICD-10-CM | POA: Insufficient documentation

## 2020-12-29 DIAGNOSIS — R293 Abnormal posture: Secondary | ICD-10-CM | POA: Insufficient documentation

## 2020-12-29 DIAGNOSIS — M545 Low back pain, unspecified: Secondary | ICD-10-CM | POA: Insufficient documentation

## 2020-12-29 DIAGNOSIS — G8929 Other chronic pain: Secondary | ICD-10-CM | POA: Insufficient documentation

## 2020-12-29 DIAGNOSIS — M6281 Muscle weakness (generalized): Secondary | ICD-10-CM | POA: Insufficient documentation

## 2020-12-30 DIAGNOSIS — D631 Anemia in chronic kidney disease: Secondary | ICD-10-CM | POA: Diagnosis not present

## 2020-12-30 DIAGNOSIS — N2581 Secondary hyperparathyroidism of renal origin: Secondary | ICD-10-CM | POA: Diagnosis not present

## 2020-12-30 DIAGNOSIS — Z992 Dependence on renal dialysis: Secondary | ICD-10-CM | POA: Diagnosis not present

## 2020-12-30 DIAGNOSIS — D509 Iron deficiency anemia, unspecified: Secondary | ICD-10-CM | POA: Diagnosis not present

## 2020-12-30 DIAGNOSIS — E1129 Type 2 diabetes mellitus with other diabetic kidney complication: Secondary | ICD-10-CM | POA: Diagnosis not present

## 2020-12-30 DIAGNOSIS — N186 End stage renal disease: Secondary | ICD-10-CM | POA: Diagnosis not present

## 2021-01-02 DIAGNOSIS — D509 Iron deficiency anemia, unspecified: Secondary | ICD-10-CM | POA: Diagnosis not present

## 2021-01-02 DIAGNOSIS — N2581 Secondary hyperparathyroidism of renal origin: Secondary | ICD-10-CM | POA: Diagnosis not present

## 2021-01-02 DIAGNOSIS — Z992 Dependence on renal dialysis: Secondary | ICD-10-CM | POA: Diagnosis not present

## 2021-01-02 DIAGNOSIS — E1129 Type 2 diabetes mellitus with other diabetic kidney complication: Secondary | ICD-10-CM | POA: Diagnosis not present

## 2021-01-02 DIAGNOSIS — N186 End stage renal disease: Secondary | ICD-10-CM | POA: Diagnosis not present

## 2021-01-02 DIAGNOSIS — D631 Anemia in chronic kidney disease: Secondary | ICD-10-CM | POA: Diagnosis not present

## 2021-01-03 ENCOUNTER — Ambulatory Visit: Payer: Medicare Other

## 2021-01-04 DIAGNOSIS — N186 End stage renal disease: Secondary | ICD-10-CM | POA: Diagnosis not present

## 2021-01-04 DIAGNOSIS — Z992 Dependence on renal dialysis: Secondary | ICD-10-CM | POA: Diagnosis not present

## 2021-01-04 DIAGNOSIS — E1129 Type 2 diabetes mellitus with other diabetic kidney complication: Secondary | ICD-10-CM | POA: Diagnosis not present

## 2021-01-04 DIAGNOSIS — D631 Anemia in chronic kidney disease: Secondary | ICD-10-CM | POA: Diagnosis not present

## 2021-01-04 DIAGNOSIS — D509 Iron deficiency anemia, unspecified: Secondary | ICD-10-CM | POA: Diagnosis not present

## 2021-01-04 DIAGNOSIS — N2581 Secondary hyperparathyroidism of renal origin: Secondary | ICD-10-CM | POA: Diagnosis not present

## 2021-01-05 ENCOUNTER — Ambulatory Visit: Payer: Medicare Other | Admitting: Physical Therapy

## 2021-01-05 ENCOUNTER — Other Ambulatory Visit: Payer: Self-pay

## 2021-01-05 ENCOUNTER — Encounter: Payer: Self-pay | Admitting: Physical Therapy

## 2021-01-05 DIAGNOSIS — G8929 Other chronic pain: Secondary | ICD-10-CM | POA: Diagnosis not present

## 2021-01-05 DIAGNOSIS — Z9181 History of falling: Secondary | ICD-10-CM

## 2021-01-05 DIAGNOSIS — M6281 Muscle weakness (generalized): Secondary | ICD-10-CM

## 2021-01-05 DIAGNOSIS — R293 Abnormal posture: Secondary | ICD-10-CM | POA: Diagnosis not present

## 2021-01-05 DIAGNOSIS — R2681 Unsteadiness on feet: Secondary | ICD-10-CM | POA: Diagnosis not present

## 2021-01-05 DIAGNOSIS — M545 Low back pain, unspecified: Secondary | ICD-10-CM

## 2021-01-05 DIAGNOSIS — R2689 Other abnormalities of gait and mobility: Secondary | ICD-10-CM | POA: Diagnosis not present

## 2021-01-05 NOTE — Therapy (Signed)
[place]. [place], [place], [address] Phone: [phone]   Fax:  [phone]  Physical Therapy Treatment  Patient Details  Name: [name] MRN: [id number] Date of Birth: 1961/04/05 Referring Provider (PT): Tylene Fantasia MD - [facility]   Encounter Date: 01/05/2021   PT End of Session - 01/05/21 0846    Visit Number 3    Date for PT Re-Evaluation 02/07/21    PT Start Time 0800    PT Stop Time 0843    PT Time Calculation (min) 43 min    Activity Tolerance Patient tolerated treatment well;Patient limited by fatigue;Patient limited by pain    Behavior During Therapy [facility] for tasks assessed/performed           Past Medical History:  Diagnosis Date  . Arthritis   . Atrial fibrillation ([place])   . CHF (congestive heart failure), NYHA class III (HCC)    s/p heart transplant  . Chronic kidney disease    end stage  . Chronic systolic dysfunction of left ventricle   . CVA (cerebral infarction)   . Diabetes mellitus    PMH; Prior to heart transplant  . Family history of coronary artery disease    in both parents  . GERD (gastroesophageal reflux disease)   . Hyperlipidemia   . Hypertension   . Left bundle branch block   . Morbid obesity ([place])    status post lap band  . Myocardial infarction [facility])    prior to heart transplant  . Nonischemic cardiomyopathy ([place])    prior to heart transplant  . Obesity (BMI 30-39.9)   . Obstructive sleep apnea    no longer needs CPAP after heart transplant per pt  . Premature ventricular contractions   . Prostate cancer ([place])   . SOB (shortness of breath)     Past Surgical History:  Procedure Laterality Date  . CARDIAC PACEMAKER PLACEMENT  09/21/2009   Biventricular implantable cardioverter-defibrillator implantation     . COLONOSCOPY W/ BIOPSIES AND POLYPECTOMY    . FOOT SURGERY     left  . HEART TRANSPLANT  2014  . LAPAROSCOPIC GASTRIC BANDING   01/27/2007  . LEFT VENTRICULAR ASSIST DEVICE     implanted at [facility]  . PROSTATE BIOPSY    . TOTAL HIP ARTHROPLASTY Right 07/22/2017   Procedure: RIGHT TOTAL HIP ARTHROPLASTY ANTERIOR APPROACH;  Surgeon: Rod Can, MD;  Location: [place];  Service: Orthopedics;  Laterality: Right;  Needs RNFA  . TOTAL KNEE ARTHROPLASTY Right 07/22/2017    There were no vitals filed for this visit.   Subjective Assessment - 01/05/21 0757    Subjective Pt has been sick with COVID for the past month. Has also been having some R hip pain rated at 9/10. Plans to call his ortho MD if the hip pain does not resolve or improve over the next few days.    Currently in Pain? Yes    Pain Score 8     Pain Location Back                             [facility] Adult PT Treatment/Exercise - 01/05/21 0001      Lumbar Exercises: Aerobic   UBE (Upper Arm Bike) L 1.5 x3 min each    Nustep L5 x 6 min      Lumbar Exercises: Machines for Strengthening   Other Lumbar Machine Exercise rows  and lats 25# 2x10    Other Lumbar Machine Exercise standing shoulder ext 10# 2x10      Lumbar Exercises: Seated   Long Arc Quad on Chair Both;2 sets;10 reps    LAQ on Chair Weights (lbs) 5    Sit to Stand 20 reps   no UE support from rasied mat table; 1st set holding yellow ball, 2nd set with OHP   Other Seated Lumbar Exercises seated marches 5# 2x10   partial ROM d/t increased R hip pain                   PT Short Term Goals - 12/15/20 1141      PT SHORT TERM GOAL #1   Title Independent with initial HEP    Time 2    Period Weeks    Status Achieved    Target Date 12/20/20             PT Long Term Goals - 12/06/20 1022      PT LONG TERM GOAL #1   Title Berg improved to at least 50/56 to demonstrate decreased risk of falls    Baseline 32/56 - high fall risk    Time 8    Period Weeks    Status New    Target Date 02/07/21      PT LONG TERM GOAL #2   Title 5TSTS without UE support < 12 seconds  to demonstrate improved functional LE strength    Baseline Unable to complete without use of hands, able to complete within 21 seconds using BUE assistance    Time 8    Period Weeks    Status New    Target Date 02/07/21      PT LONG TERM GOAL #3   Title 6MWT distancce traveled to at least 500 m to demonstrate improved aerobic capacity and endurance    Time 8    Period Weeks    Status New    Target Date 02/07/21      PT LONG TERM GOAL #4   Title Overall gait with improved symmetry, improved tolerance to prolonged walking with </= 2/10 LBP to facilitate safe participation in grocery shopping and community activities.    Time 8    Period Weeks    Status New    Target Date 02/07/21      PT LONG TERM GOAL #5   Title BLE strength at least 4+/5 with </= 2/10 LBP    Time 8    Period Weeks    Status New    Target Date 02/07/21                 Plan - 01/05/21 0847    Clinical Impression Statement Pt presents to clinic for first time follow up since contracting COVID. States having some brain fog along with lingering fatigue. Pt also presenting with increased R hip pain rated at 9/10. Focused most ex's in seated this rx d/t increase in R hip pain. Educated to contact MD if hip pain persists or worsens with pt VU and agreement. Plan to get new baseline for transplant tests if pain allows next rx.    PT Treatment/Interventions ADLs/Self Care Home Management;Electrical Stimulation;Moist Heat;Cryotherapy;Gait training;Neuromuscular re-education;Balance training;Therapeutic exercise;Therapeutic activities;Functional mobility training;Patient/family education;Manual techniques;Energy conservation;Vestibular    PT Next Visit Plan Progress general strength, aerobic capacity/endurance with VS monitoring, balance as tolerated. May incorporate some LE/back flexibility. Manual/modalities for pain mgmt as needed.    Consulted and Agree with Plan  of Care Patient           Patient will benefit  from skilled therapeutic intervention in order to improve the following deficits and impairments:  Abnormal gait,Decreased range of motion,Difficulty walking,Increased muscle spasms,Decreased endurance,Cardiopulmonary status limiting activity,Pain,Impaired flexibility,Improper body mechanics,Decreased balance,Decreased mobility,Decreased strength,Impaired sensation  Visit Diagnosis: Abnormal posture  Muscle weakness (generalized)  Other abnormalities of gait and mobility  Unsteadiness on feet  Chronic low back pain, unspecified back pain laterality, unspecified whether sciatica present  History of falling     Problem List Patient Active Problem List   Diagnosis Date Noted  . Morbid obesity ([place]) 11/19/2019  . Sleep disorder 10/22/2019  . Sleep disturbance 10/22/2019  . Abnormality of gait 06/09/2019  . Diabetic polyneuropathy associated with diabetes mellitus due to underlying condition ([place]) 04/14/2019  . Peripheral neuropathy 12/11/2018  . CVA (cerebral vascular accident) ([place]) 11/09/2018  . H/O heart transplant ([place]) 11/09/2018  . Malignant neoplasm of prostate ([place]) 05/06/2018  . Closed posterior dislocation of hip, right, initial encounter ([place]) 08/24/2017  . End stage renal disease on dialysis ([place]) 08/24/2017  . Hip dislocation, right ([place]) 08/24/2017  . Osteoarthritis of right hip 07/22/2017  . VENTRICULAR TACHYCARDIA 12/21/2009  . Dyslipidemia 08/30/2009  . Essential hypertension 08/30/2009  . ISCHEMIC CARDIOMYOPATHY 08/30/2009  . Atrial fibrillation ([place]) 08/30/2009  . VENTRICULAR ECTOPY 08/30/2009  . CEREBROVASCULAR ACCIDENT, HX OF 08/30/2009  . Obesity, Class III, BMI 40-49.9 (morbid obesity) ([place]) 08/30/2009  . Diabetes mellitus type 2 in obese ([employer]) 10/17/2007  . OBSTRUCTIVE SLEEP APNEA 10/17/2007  . CARDIOMYOPATHY, DILATED 10/17/2007   Amador Cunas, PT, DPT Donald Prose [facility] [place] 01/05/2021, 8:49 AM  [place]. [place], [place], [address] Phone: [phone]   Fax:  [phone]  Name: ACHERON Aune Adami MRN: [id number] Date of Birth: 06-Jan-1961

## 2021-01-06 DIAGNOSIS — N2581 Secondary hyperparathyroidism of renal origin: Secondary | ICD-10-CM | POA: Diagnosis not present

## 2021-01-06 DIAGNOSIS — D509 Iron deficiency anemia, unspecified: Secondary | ICD-10-CM | POA: Diagnosis not present

## 2021-01-06 DIAGNOSIS — N186 End stage renal disease: Secondary | ICD-10-CM | POA: Diagnosis not present

## 2021-01-06 DIAGNOSIS — D631 Anemia in chronic kidney disease: Secondary | ICD-10-CM | POA: Diagnosis not present

## 2021-01-06 DIAGNOSIS — E1129 Type 2 diabetes mellitus with other diabetic kidney complication: Secondary | ICD-10-CM | POA: Diagnosis not present

## 2021-01-06 DIAGNOSIS — Z992 Dependence on renal dialysis: Secondary | ICD-10-CM | POA: Diagnosis not present

## 2021-01-09 DIAGNOSIS — N186 End stage renal disease: Secondary | ICD-10-CM | POA: Diagnosis not present

## 2021-01-09 DIAGNOSIS — D631 Anemia in chronic kidney disease: Secondary | ICD-10-CM | POA: Diagnosis not present

## 2021-01-09 DIAGNOSIS — Z992 Dependence on renal dialysis: Secondary | ICD-10-CM | POA: Diagnosis not present

## 2021-01-09 DIAGNOSIS — E1129 Type 2 diabetes mellitus with other diabetic kidney complication: Secondary | ICD-10-CM | POA: Diagnosis not present

## 2021-01-09 DIAGNOSIS — N2581 Secondary hyperparathyroidism of renal origin: Secondary | ICD-10-CM | POA: Diagnosis not present

## 2021-01-09 DIAGNOSIS — D509 Iron deficiency anemia, unspecified: Secondary | ICD-10-CM | POA: Diagnosis not present

## 2021-01-10 ENCOUNTER — Other Ambulatory Visit: Payer: Self-pay

## 2021-01-10 ENCOUNTER — Ambulatory Visit: Payer: Medicare Other

## 2021-01-10 DIAGNOSIS — M545 Low back pain, unspecified: Secondary | ICD-10-CM | POA: Diagnosis not present

## 2021-01-10 DIAGNOSIS — G8929 Other chronic pain: Secondary | ICD-10-CM

## 2021-01-10 DIAGNOSIS — R293 Abnormal posture: Secondary | ICD-10-CM

## 2021-01-10 DIAGNOSIS — M6281 Muscle weakness (generalized): Secondary | ICD-10-CM | POA: Diagnosis not present

## 2021-01-10 DIAGNOSIS — R2681 Unsteadiness on feet: Secondary | ICD-10-CM | POA: Diagnosis not present

## 2021-01-10 DIAGNOSIS — R2689 Other abnormalities of gait and mobility: Secondary | ICD-10-CM | POA: Diagnosis not present

## 2021-01-10 DIAGNOSIS — Z9181 History of falling: Secondary | ICD-10-CM

## 2021-01-10 NOTE — Therapy (Signed)
[place]. [place], [place], [address] Phone: [phone]   Fax:  [phone]  Physical Therapy Treatment  Patient Details  Name: [name] MRN: [id number] Date of Birth: 31-Mar-1961 Referring Provider (PT): Tylene Fantasia MD - [facility]   Encounter Date: 01/10/2021   PT End of Session - 01/10/21 0803    Visit Number 4    Date for PT Re-Evaluation 02/07/21    Authorization Type Medicare & UHC    PT Start Time 0758    PT Stop Time 0838    PT Time Calculation (min) 40 min    Activity Tolerance Patient tolerated treatment well;Patient limited by fatigue;Patient limited by pain    Behavior During Therapy [facility] for tasks assessed/performed           Past Medical History:  Diagnosis Date  . Arthritis   . Atrial fibrillation ([place])   . CHF (congestive heart failure), NYHA class III (HCC)    s/p heart transplant  . Chronic kidney disease    end stage  . Chronic systolic dysfunction of left ventricle   . CVA (cerebral infarction)   . Diabetes mellitus    PMH; Prior to heart transplant  . Family history of coronary artery disease    in both parents  . GERD (gastroesophageal reflux disease)   . Hyperlipidemia   . Hypertension   . Left bundle branch block   . Morbid obesity ([place])    status post lap band  . Myocardial infarction [facility])    prior to heart transplant  . Nonischemic cardiomyopathy ([place])    prior to heart transplant  . Obesity (BMI 30-39.9)   . Obstructive sleep apnea    no longer needs CPAP after heart transplant per pt  . Premature ventricular contractions   . Prostate cancer ([place])   . SOB (shortness of breath)     Past Surgical History:  Procedure Laterality Date  . CARDIAC PACEMAKER PLACEMENT  09/21/2009   Biventricular implantable cardioverter-defibrillator implantation     . COLONOSCOPY W/ BIOPSIES AND POLYPECTOMY    . FOOT SURGERY     left  . HEART TRANSPLANT   2014  . LAPAROSCOPIC GASTRIC BANDING  01/27/2007  . LEFT VENTRICULAR ASSIST DEVICE     implanted at [facility]  . PROSTATE BIOPSY    . TOTAL HIP ARTHROPLASTY Right 07/22/2017   Procedure: RIGHT TOTAL HIP ARTHROPLASTY ANTERIOR APPROACH;  Surgeon: Rod Can, MD;  Location: [place];  Service: Orthopedics;  Laterality: Right;  Needs RNFA  . TOTAL KNEE ARTHROPLASTY Right 07/22/2017    There were no vitals filed for this visit.   Subjective Assessment - 01/10/21 0801    Subjective Pt has been sick with COVID for the past month. Has also been having some R hip pain rated at 9/10, "kepeing me from walking right". Plans to call his ortho MD today about the continued hip pain.    Pertinent History heart transplant 6 yrs ago, ESRD, prostate CA, OSA, obesity, CHF, GERD, had DM, CVA with residual left hand weakness,  peripheral neuropathy that started about 6 months ago,  R THA, AFib    Patient Stated Goals to improve strength, stamina    Currently in Pain? Yes    Pain Score 9     Pain Location Hip   and some in lower back   Pain Orientation Right  [place] Adult PT Treatment/Exercise - 01/10/21 0001      Transfers   Five time sit to stand comments  reassessed 01/10/2021: 17 seconds from mat table without significant incr in hip pain      Lumbar Exercises: Aerobic   Nustep L5 x 6 min      Lumbar Exercises: Machines for Strengthening   Other Lumbar Machine Exercise rows and lats 25# 2x10    Other Lumbar Machine Exercise standing shoulder ext 10# 2x10   fatigue reported after requiring seated rest break     Lumbar Exercises: Seated   Long Arc Quad on Chair Both;2 sets;10 reps    LAQ on Chair Weights (lbs) 5    Sit to Stand 15 reps   5 x 3 with rests in between d/t fatigue (reports RPE 5/10 end of eachs set of 5)   Other Seated Lumbar Exercises seated marches 5# 2x10      Knee/Hip Exercises: Standing   Hip Flexion AROM;Both   attempted x 3 reps which  increased hip pain, deferred   Hip Abduction Both;1 set;10 reps    Abduction Limitations toe taps only, required seated rest post d/t pain    Hip Extension Both;1 set;10 reps    Extension Limitations alternating w/o weight, toe taps only                    PT Short Term Goals - 12/15/20 1141      PT SHORT TERM GOAL #1   Title Independent with initial HEP    Time 2    Period Weeks    Status Achieved    Target Date 12/20/20             PT Long Term Goals - 12/06/20 1022      PT LONG TERM GOAL #1   Title Berg improved to at least 50/56 to demonstrate decreased risk of falls    Baseline 32/56 - high fall risk    Time 8    Period Weeks    Status New    Target Date 02/07/21      PT LONG TERM GOAL #2   Title 5TSTS without UE support < 12 seconds to demonstrate improved functional LE strength    Baseline Unable to complete without use of hands, able to complete within 21 seconds using BUE assistance    Time 8    Period Weeks    Status New    Target Date 02/07/21      PT LONG TERM GOAL #3   Title 6MWT distancce traveled to at least 500 m to demonstrate improved aerobic capacity and endurance    Time 8    Period Weeks    Status New    Target Date 02/07/21      PT LONG TERM GOAL #4   Title Overall gait with improved symmetry, improved tolerance to prolonged walking with </= 2/10 LBP to facilitate safe participation in grocery shopping and community activities.    Time 8    Period Weeks    Status New    Target Date 02/07/21      PT LONG TERM GOAL #5   Title BLE strength at least 4+/5 with </= 2/10 LBP    Time 8    Period Weeks    Status New    Target Date 02/07/21                 Plan - 01/10/21 0804    Clinical  Impression Statement Pt tolerated all exercises fairly today bu toverall very limited by right hip pain and fatigue. Reassessed 5TSTS today and slightly improved from initial baseline score at initial eval. Given pain and fatigue focus of  session on seated exercise with a few standing exercises with BUE support in  bars. He reported some windedness/fatigue rated 5/10 (O2 assessed and ranging 94-97% with activity) with exercises requiring multiple seated rests to manage this. He reports plan to contact ortho today regarding hip pain - plan to follow up with him about this.    PT Treatment/Interventions ADLs/Self Care Home Management;Electrical Stimulation;Moist Heat;Cryotherapy;Gait training;Neuromuscular re-education;Balance training;Therapeutic exercise;Therapeutic activities;Functional mobility training;Patient/family education;Manual techniques;Energy conservation;Vestibular    PT Next Visit Plan Progress general strength, aerobic capacity/endurance with VS monitoring, balance as tolerated. May incorporate some LE/back flexibility. Manual/modalities for pain mgmt as needed. can retake more baseline measurements for berg, 6MWT as tolerated to assess for any differences after recent bout of COVID    Consulted and Agree with Plan of Care Patient           Patient will benefit from skilled therapeutic intervention in order to improve the following deficits and impairments:  Abnormal gait,Decreased range of motion,Difficulty walking,Increased muscle spasms,Decreased endurance,Cardiopulmonary status limiting activity,Pain,Impaired flexibility,Improper body mechanics,Decreased balance,Decreased mobility,Decreased strength,Impaired sensation  Visit Diagnosis: Abnormal posture  Muscle weakness (generalized)  Other abnormalities of gait and mobility  Unsteadiness on feet  Chronic low back pain, unspecified back pain laterality, unspecified whether sciatica present  History of falling     Problem List Patient Active Problem List   Diagnosis Date Noted  . Morbid obesity ([place]) 11/19/2019  . Sleep disorder 10/22/2019  . Sleep disturbance 10/22/2019  . Abnormality of gait 06/09/2019  . Diabetic polyneuropathy associated with  diabetes mellitus due to underlying condition ([place]) 04/14/2019  . Peripheral neuropathy 12/11/2018  . CVA (cerebral vascular accident) ([place]) 11/09/2018  . H/O heart transplant ([place]) 11/09/2018  . Malignant neoplasm of prostate ([place]) 05/06/2018  . Closed posterior dislocation of hip, right, initial encounter ([place]) 08/24/2017  . End stage renal disease on dialysis ([place]) 08/24/2017  . Hip dislocation, right ([place]) 08/24/2017  . Osteoarthritis of right hip 07/22/2017  . VENTRICULAR TACHYCARDIA 12/21/2009  . Dyslipidemia 08/30/2009  . Essential hypertension 08/30/2009  . ISCHEMIC CARDIOMYOPATHY 08/30/2009  . Atrial fibrillation ([place]) 08/30/2009  . VENTRICULAR ECTOPY 08/30/2009  . CEREBROVASCULAR ACCIDENT, HX OF 08/30/2009  . Obesity, Class III, BMI 40-49.9 (morbid obesity) ([place]) 08/30/2009  . Diabetes mellitus type 2 in obese ([place]) 10/17/2007  . OBSTRUCTIVE SLEEP APNEA 10/17/2007  . CARDIOMYOPATHY, DILATED 10/17/2007    Hall Busing, PT, DPT 01/10/2021, 8:43 AM  [place]. [place], [place], [address] Phone: [phone]   Fax:  [phone]  Name: [name] MRN: [id number] Date of Birth: 25-May-1961

## 2021-01-11 DIAGNOSIS — D631 Anemia in chronic kidney disease: Secondary | ICD-10-CM | POA: Diagnosis not present

## 2021-01-11 DIAGNOSIS — N186 End stage renal disease: Secondary | ICD-10-CM | POA: Diagnosis not present

## 2021-01-11 DIAGNOSIS — N2581 Secondary hyperparathyroidism of renal origin: Secondary | ICD-10-CM | POA: Diagnosis not present

## 2021-01-11 DIAGNOSIS — Z992 Dependence on renal dialysis: Secondary | ICD-10-CM | POA: Diagnosis not present

## 2021-01-11 DIAGNOSIS — D509 Iron deficiency anemia, unspecified: Secondary | ICD-10-CM | POA: Diagnosis not present

## 2021-01-11 DIAGNOSIS — E1129 Type 2 diabetes mellitus with other diabetic kidney complication: Secondary | ICD-10-CM | POA: Diagnosis not present

## 2021-01-12 ENCOUNTER — Other Ambulatory Visit: Payer: Self-pay

## 2021-01-12 ENCOUNTER — Ambulatory Visit: Payer: Medicare Other

## 2021-01-12 DIAGNOSIS — R2681 Unsteadiness on feet: Secondary | ICD-10-CM

## 2021-01-12 DIAGNOSIS — M6281 Muscle weakness (generalized): Secondary | ICD-10-CM

## 2021-01-12 DIAGNOSIS — G8929 Other chronic pain: Secondary | ICD-10-CM | POA: Diagnosis not present

## 2021-01-12 DIAGNOSIS — R293 Abnormal posture: Secondary | ICD-10-CM | POA: Diagnosis not present

## 2021-01-12 DIAGNOSIS — R2689 Other abnormalities of gait and mobility: Secondary | ICD-10-CM

## 2021-01-12 DIAGNOSIS — M545 Low back pain, unspecified: Secondary | ICD-10-CM

## 2021-01-12 DIAGNOSIS — Z9181 History of falling: Secondary | ICD-10-CM

## 2021-01-12 NOTE — Therapy (Signed)
[place]. [place], [place], [address] Phone: [phone]   Fax:  [phone]  Physical Therapy Treatment  Patient Details  Name: [name] MRN: [id number] Date of Birth: Dec 02, 1960 Referring Provider (PT): Tylene Fantasia MD - [facility]   Encounter Date: 01/12/2021   PT End of Session - 01/12/21 0804    Visit Number 5    Date for PT Re-Evaluation 02/07/21    Authorization Type Medicare & UHC    PT Start Time 0759    PT Stop Time 0842    PT Time Calculation (min) 43 min    Activity Tolerance Patient tolerated treatment well;Patient limited by fatigue;Patient limited by pain    Behavior During Therapy [facility] for tasks assessed/performed           Past Medical History:  Diagnosis Date  . Arthritis   . Atrial fibrillation ([place])   . CHF (congestive heart failure), NYHA class III (HCC)    s/p heart transplant  . Chronic kidney disease    end stage  . Chronic systolic dysfunction of left ventricle   . CVA (cerebral infarction)   . Diabetes mellitus    PMH; Prior to heart transplant  . Family history of coronary artery disease    in both parents  . GERD (gastroesophageal reflux disease)   . Hyperlipidemia   . Hypertension   . Left bundle branch block   . Morbid obesity ([place])    status post lap band  . Myocardial infarction [facility])    prior to heart transplant  . Nonischemic cardiomyopathy ([place])    prior to heart transplant  . Obesity (BMI 30-39.9)   . Obstructive sleep apnea    no longer needs CPAP after heart transplant per pt  . Premature ventricular contractions   . Prostate cancer ([place])   . SOB (shortness of breath)     Past Surgical History:  Procedure Laterality Date  . CARDIAC PACEMAKER PLACEMENT  09/21/2009   Biventricular implantable cardioverter-defibrillator implantation     . COLONOSCOPY W/ BIOPSIES AND POLYPECTOMY    . FOOT SURGERY     left  . HEART TRANSPLANT   2014  . LAPAROSCOPIC GASTRIC BANDING  01/27/2007  . LEFT VENTRICULAR ASSIST DEVICE     implanted at [facility]  . PROSTATE BIOPSY    . TOTAL HIP ARTHROPLASTY Right 07/22/2017   Procedure: RIGHT TOTAL HIP ARTHROPLASTY ANTERIOR APPROACH;  Surgeon: Rod Can, MD;  Location: [facility];  Service: Orthopedics;  Laterality: Right;  Needs RNFA  . TOTAL KNEE ARTHROPLASTY Right 07/22/2017    There were no vitals filed for this visit.   Subjective Assessment - 01/12/21 0802    Subjective Was able to schedule apptmt with ortho but not going to be for another few weeks. Still with same hip pain    Pertinent History heart transplant 6 yrs ago, ESRD, prostate CA, OSA, obesity, CHF, GERD, had DM, CVA with residual left hand weakness,  peripheral neuropathy that started about 6 months ago,  R THA, AFib    Patient Stated Goals to improve strength, stamina    Currently in Pain? Yes    Pain Score 7     Pain Location Hip    Pain Orientation Right              [facility] PT Assessment - 01/12/21 0001      Berg Balance Test   Sit to Stand Able to stand  independently  using hands    Standing Unsupported Able to stand 2 minutes with supervision   limited by pain   Sitting with Back Unsupported but Feet Supported on Floor or Stool Able to sit safely and securely 2 minutes    Stand to Sit Controls descent by using hands    Transfers Able to transfer safely, definite need of hands    Standing Unsupported with Eyes Closed Able to stand 10 seconds with supervision   " a little dizzy"   Standing Unsupported with Feet Together Able to place feet together independently and stand for 1 minute with supervision    From Standing, Reach Forward with Outstretched Arm Can reach forward >12 cm safely (5")    From Standing Position, Pick up Object from Floor Able to pick up shoe, needs supervision    From Standing Position, Turn to Look Behind Over each Shoulder Turn sideways only but maintains balance    Turn 360 Degrees Able  to turn 360 degrees safely one side only in 4 seconds or less    Standing Unsupported, Alternately Place Feet on Step/Stool Able to stand independently and complete 8 steps >20 seconds    Standing Unsupported, One Foot in [employer] balance while stepping or standing    Standing on One Leg Unable to try or needs assist to prevent fall    Total Score 36                         [facility] Adult PT Treatment/Exercise - 01/12/21 0001      Lumbar Exercises: Stretches   Lower Trunk Rotation 5 reps   2 sets     Lumbar Exercises: Aerobic   Nustep L5 x 6 min      Lumbar Exercises: Seated   Long Arc Quad on Chair Both;2 sets;10 reps    LAQ on Chair Weights (lbs) 5    Hip Flexion on Ball Limitations Seated march on mat 10 x 2 5# AWs    Sit to Stand 20 reps   requriing cues to prevent staying in forward flexion. Had to elevate mayt table even higher for 2nd set d/t R ip pain "sore, like bone sore"   Other Seated Lumbar Exercises Seated HS curls blue band 10 x 2 B.      Lumbar Exercises: Supine   Bridge with Ball Squeeze 20 reps   2x20with green ball -  cues for hip lift     Knee/Hip Exercises: Seated   Ball Squeeze 3 x 10- releived right hip pain a bit    Other Seated Knee/Hip Exercises seated seated heel/toe raises 2x10 with 3" holds    Abduction/Adduction  Strengthening;Both;2 sets;10 reps   red TB                   PT Short Term Goals - 12/15/20 1141      PT SHORT TERM GOAL #1   Title Independent with initial HEP    Time 2    Period Weeks    Status Achieved    Target Date 12/20/20             PT Long Term Goals - 12/06/20 1022      PT LONG TERM GOAL #1   Title Berg improved to at least 50/56 to demonstrate decreased risk of falls    Baseline 32/56 - high fall risk    Time 8    Period Weeks    Status New  Target Date 02/07/21      PT LONG TERM GOAL #2   Title 5TSTS without UE support < 12 seconds to demonstrate improved functional LE strength     Baseline Unable to complete without use of hands, able to complete within 21 seconds using BUE assistance    Time 8    Period Weeks    Status New    Target Date 02/07/21      PT LONG TERM GOAL #3   Title 6MWT distancce traveled to at least 500 m to demonstrate improved aerobic capacity and endurance    Time 8    Period Weeks    Status New    Target Date 02/07/21      PT LONG TERM GOAL #4   Title Overall gait with improved symmetry, improved tolerance to prolonged walking with </= 2/10 LBP to facilitate safe participation in grocery shopping and community activities.    Time 8    Period Weeks    Status New    Target Date 02/07/21      PT LONG TERM GOAL #5   Title BLE strength at least 4+/5 with </= 2/10 LBP    Time 8    Period Weeks    Status New    Target Date 02/07/21                 Plan - 01/12/21 0804    Clinical Impression Statement Pt continues to be limited by R hip pain. Reassessed berg today slight improvement from initial eval but still limited. D/t hip pain unable to tolerate standing exercises to focused on seated and supine strenght and mobility today. Of note he reported relief of hip pain with ball squeezes in sitting (not as much when performed simultaneosly with bridge/hipext). Plan to continue to work on strength and conditioning with seated/supine activity and machines and work in standing exercises as tolerated.    PT Treatment/Interventions ADLs/Self Care Home Management;Electrical Stimulation;Moist Heat;Cryotherapy;Gait training;Neuromuscular re-education;Balance training;Therapeutic exercise;Therapeutic activities;Functional mobility training;Patient/family education;Manual techniques;Energy conservation;Vestibular    PT Next Visit Plan Progress general strength, aerobic capacity/endurance with VS monitoring, balance as tolerated. May incorporate some LE/back flexibility. Manual/modalities for pain mgmt as needed.    Consulted and Agree with Plan of Care  Patient           Patient will benefit from skilled therapeutic intervention in order to improve the following deficits and impairments:  Abnormal gait,Decreased range of motion,Difficulty walking,Increased muscle spasms,Decreased endurance,Cardiopulmonary status limiting activity,Pain,Impaired flexibility,Improper body mechanics,Decreased balance,Decreased mobility,Decreased strength,Impaired sensation  Visit Diagnosis: Abnormal posture  Muscle weakness (generalized)  Other abnormalities of gait and mobility  Unsteadiness on feet  Chronic low back pain, unspecified back pain laterality, unspecified whether sciatica present  History of falling     Problem List Patient Active Problem List   Diagnosis Date Noted  . Morbid obesity ([place]) 11/19/2019  . Sleep disorder 10/22/2019  . Sleep disturbance 10/22/2019  . Abnormality of gait 06/09/2019  . Diabetic polyneuropathy associated with diabetes mellitus due to underlying condition ([place]) 04/14/2019  . Peripheral neuropathy 12/11/2018  . CVA (cerebral vascular accident) ([place]) 11/09/2018  . H/O heart transplant ([place]) 11/09/2018  . Malignant neoplasm of prostate ([place]) 05/06/2018  . Closed posterior dislocation of hip, right, initial encounter ([place]) 08/24/2017  . End stage renal disease on dialysis ([place]) 08/24/2017  . Hip dislocation, right ([place]) 08/24/2017  . Osteoarthritis of right hip 07/22/2017  . VENTRICULAR TACHYCARDIA 12/21/2009  . Dyslipidemia 08/30/2009  . Essential hypertension  08/30/2009  . ISCHEMIC CARDIOMYOPATHY 08/30/2009  . Atrial fibrillation ([place]) 08/30/2009  . VENTRICULAR ECTOPY 08/30/2009  . CEREBROVASCULAR ACCIDENT, HX OF 08/30/2009  . Obesity, Class III, BMI 40-49.9 (morbid obesity) ([place]) 08/30/2009  . Diabetes mellitus type 2 in obese ([place]) 10/17/2007  . OBSTRUCTIVE SLEEP APNEA 10/17/2007  . CARDIOMYOPATHY, DILATED 10/17/2007    Hall Busing, PT, DPT 01/12/2021, 8:40 AM  [place]. [place], [place], [address] Phone: [phone]   Fax:  [phone]  Name: [name] MRN: [id number] Date of Birth: 12/26/1960

## 2021-01-13 DIAGNOSIS — D631 Anemia in chronic kidney disease: Secondary | ICD-10-CM | POA: Diagnosis not present

## 2021-01-13 DIAGNOSIS — D509 Iron deficiency anemia, unspecified: Secondary | ICD-10-CM | POA: Diagnosis not present

## 2021-01-13 DIAGNOSIS — Z992 Dependence on renal dialysis: Secondary | ICD-10-CM | POA: Diagnosis not present

## 2021-01-13 DIAGNOSIS — E1129 Type 2 diabetes mellitus with other diabetic kidney complication: Secondary | ICD-10-CM | POA: Diagnosis not present

## 2021-01-13 DIAGNOSIS — N186 End stage renal disease: Secondary | ICD-10-CM | POA: Diagnosis not present

## 2021-01-13 DIAGNOSIS — N2581 Secondary hyperparathyroidism of renal origin: Secondary | ICD-10-CM | POA: Diagnosis not present

## 2021-01-16 DIAGNOSIS — E1129 Type 2 diabetes mellitus with other diabetic kidney complication: Secondary | ICD-10-CM | POA: Diagnosis not present

## 2021-01-16 DIAGNOSIS — Z992 Dependence on renal dialysis: Secondary | ICD-10-CM | POA: Diagnosis not present

## 2021-01-16 DIAGNOSIS — D631 Anemia in chronic kidney disease: Secondary | ICD-10-CM | POA: Diagnosis not present

## 2021-01-16 DIAGNOSIS — N186 End stage renal disease: Secondary | ICD-10-CM | POA: Diagnosis not present

## 2021-01-16 DIAGNOSIS — D509 Iron deficiency anemia, unspecified: Secondary | ICD-10-CM | POA: Diagnosis not present

## 2021-01-16 DIAGNOSIS — N2581 Secondary hyperparathyroidism of renal origin: Secondary | ICD-10-CM | POA: Diagnosis not present

## 2021-01-17 ENCOUNTER — Ambulatory Visit: Payer: Medicare Other | Admitting: Physical Therapy

## 2021-01-18 DIAGNOSIS — N186 End stage renal disease: Secondary | ICD-10-CM | POA: Diagnosis not present

## 2021-01-18 DIAGNOSIS — E1129 Type 2 diabetes mellitus with other diabetic kidney complication: Secondary | ICD-10-CM | POA: Diagnosis not present

## 2021-01-18 DIAGNOSIS — N2581 Secondary hyperparathyroidism of renal origin: Secondary | ICD-10-CM | POA: Diagnosis not present

## 2021-01-18 DIAGNOSIS — Z992 Dependence on renal dialysis: Secondary | ICD-10-CM | POA: Diagnosis not present

## 2021-01-18 DIAGNOSIS — D631 Anemia in chronic kidney disease: Secondary | ICD-10-CM | POA: Diagnosis not present

## 2021-01-18 DIAGNOSIS — D509 Iron deficiency anemia, unspecified: Secondary | ICD-10-CM | POA: Diagnosis not present

## 2021-01-19 ENCOUNTER — Ambulatory Visit: Payer: Medicare Other | Admitting: Physical Therapy

## 2021-01-20 DIAGNOSIS — N186 End stage renal disease: Secondary | ICD-10-CM | POA: Diagnosis not present

## 2021-01-20 DIAGNOSIS — E1129 Type 2 diabetes mellitus with other diabetic kidney complication: Secondary | ICD-10-CM | POA: Diagnosis not present

## 2021-01-20 DIAGNOSIS — Z992 Dependence on renal dialysis: Secondary | ICD-10-CM | POA: Diagnosis not present

## 2021-01-20 DIAGNOSIS — N2581 Secondary hyperparathyroidism of renal origin: Secondary | ICD-10-CM | POA: Diagnosis not present

## 2021-01-20 DIAGNOSIS — D631 Anemia in chronic kidney disease: Secondary | ICD-10-CM | POA: Diagnosis not present

## 2021-01-20 DIAGNOSIS — D509 Iron deficiency anemia, unspecified: Secondary | ICD-10-CM | POA: Diagnosis not present

## 2021-01-23 DIAGNOSIS — D509 Iron deficiency anemia, unspecified: Secondary | ICD-10-CM | POA: Diagnosis not present

## 2021-01-23 DIAGNOSIS — E1129 Type 2 diabetes mellitus with other diabetic kidney complication: Secondary | ICD-10-CM | POA: Diagnosis not present

## 2021-01-23 DIAGNOSIS — N2581 Secondary hyperparathyroidism of renal origin: Secondary | ICD-10-CM | POA: Diagnosis not present

## 2021-01-23 DIAGNOSIS — Z992 Dependence on renal dialysis: Secondary | ICD-10-CM | POA: Diagnosis not present

## 2021-01-23 DIAGNOSIS — D631 Anemia in chronic kidney disease: Secondary | ICD-10-CM | POA: Diagnosis not present

## 2021-01-23 DIAGNOSIS — N186 End stage renal disease: Secondary | ICD-10-CM | POA: Diagnosis not present

## 2021-01-25 DIAGNOSIS — N2581 Secondary hyperparathyroidism of renal origin: Secondary | ICD-10-CM | POA: Diagnosis not present

## 2021-01-25 DIAGNOSIS — E1129 Type 2 diabetes mellitus with other diabetic kidney complication: Secondary | ICD-10-CM | POA: Diagnosis not present

## 2021-01-25 DIAGNOSIS — Z992 Dependence on renal dialysis: Secondary | ICD-10-CM | POA: Diagnosis not present

## 2021-01-25 DIAGNOSIS — R2681 Unsteadiness on feet: Secondary | ICD-10-CM | POA: Diagnosis not present

## 2021-01-25 DIAGNOSIS — D631 Anemia in chronic kidney disease: Secondary | ICD-10-CM | POA: Diagnosis not present

## 2021-01-25 DIAGNOSIS — N186 End stage renal disease: Secondary | ICD-10-CM | POA: Diagnosis not present

## 2021-01-25 DIAGNOSIS — R2689 Other abnormalities of gait and mobility: Secondary | ICD-10-CM | POA: Diagnosis not present

## 2021-01-25 DIAGNOSIS — M545 Low back pain, unspecified: Secondary | ICD-10-CM | POA: Diagnosis not present

## 2021-01-25 DIAGNOSIS — D509 Iron deficiency anemia, unspecified: Secondary | ICD-10-CM | POA: Diagnosis not present

## 2021-01-25 DIAGNOSIS — M6281 Muscle weakness (generalized): Secondary | ICD-10-CM | POA: Diagnosis not present

## 2021-01-25 DIAGNOSIS — G8929 Other chronic pain: Secondary | ICD-10-CM | POA: Diagnosis not present

## 2021-01-25 DIAGNOSIS — R293 Abnormal posture: Secondary | ICD-10-CM | POA: Diagnosis not present

## 2021-01-25 DIAGNOSIS — T8620 Unspecified complication of heart transplant: Secondary | ICD-10-CM | POA: Diagnosis not present

## 2021-01-26 ENCOUNTER — Ambulatory Visit: Payer: Medicare Other | Attending: Cardiology

## 2021-01-26 ENCOUNTER — Other Ambulatory Visit: Payer: Self-pay

## 2021-01-26 DIAGNOSIS — M545 Low back pain, unspecified: Secondary | ICD-10-CM | POA: Insufficient documentation

## 2021-01-26 DIAGNOSIS — G8929 Other chronic pain: Secondary | ICD-10-CM

## 2021-01-26 DIAGNOSIS — R293 Abnormal posture: Secondary | ICD-10-CM | POA: Insufficient documentation

## 2021-01-26 DIAGNOSIS — Z9181 History of falling: Secondary | ICD-10-CM | POA: Insufficient documentation

## 2021-01-26 DIAGNOSIS — R2689 Other abnormalities of gait and mobility: Secondary | ICD-10-CM | POA: Diagnosis not present

## 2021-01-26 DIAGNOSIS — M6281 Muscle weakness (generalized): Secondary | ICD-10-CM | POA: Insufficient documentation

## 2021-01-26 DIAGNOSIS — R2681 Unsteadiness on feet: Secondary | ICD-10-CM | POA: Insufficient documentation

## 2021-01-26 NOTE — Therapy (Signed)
[place]. [place], [place], [address] Phone: [phone]   Fax:  [phone]  Physical Therapy Treatment  Patient Details  Name: [name] MRN: [id number] Date of Birth: 02/02/1961 Referring Provider (PT): Tylene Fantasia MD - [facility]   Encounter Date: 01/26/2021   PT End of Session - 01/26/21 0953    Visit Number 6    Date for PT Re-Evaluation 02/07/21    Authorization Type Medicare & UHC    PT Start Time 0930    PT Stop Time 1015    PT Time Calculation (min) 45 min    Activity Tolerance Patient tolerated treatment well;Patient limited by fatigue;Patient limited by pain    Behavior During Therapy [facility] for tasks assessed/performed           Past Medical History:  Diagnosis Date  . Arthritis   . Atrial fibrillation ([place])   . CHF (congestive heart failure), NYHA class III (HCC)    s/p heart transplant  . Chronic kidney disease    end stage  . Chronic systolic dysfunction of left ventricle   . CVA (cerebral infarction)   . Diabetes mellitus    PMH; Prior to heart transplant  . Family history of coronary artery disease    in both parents  . GERD (gastroesophageal reflux disease)   . Hyperlipidemia   . Hypertension   . Left bundle branch block   . Morbid obesity ([place])    status post lap band  . Myocardial infarction [facility])    prior to heart transplant  . Nonischemic cardiomyopathy ([place])    prior to heart transplant  . Obesity (BMI 30-39.9)   . Obstructive sleep apnea    no longer needs CPAP after heart transplant per pt  . Premature ventricular contractions   . Prostate cancer ([place])   . SOB (shortness of breath)     Past Surgical History:  Procedure Laterality Date  . CARDIAC PACEMAKER PLACEMENT  09/21/2009   Biventricular implantable cardioverter-defibrillator implantation     . COLONOSCOPY W/ BIOPSIES AND POLYPECTOMY    . FOOT SURGERY     left  . HEART TRANSPLANT   2014  . LAPAROSCOPIC GASTRIC BANDING  01/27/2007  . LEFT VENTRICULAR ASSIST DEVICE     implanted at [facility]  . PROSTATE BIOPSY    . TOTAL HIP ARTHROPLASTY Right 07/22/2017   Procedure: RIGHT TOTAL HIP ARTHROPLASTY ANTERIOR APPROACH;  Surgeon: Rod Can, MD;  Location: [place];  Service: Orthopedics;  Laterality: Right;  Needs RNFA  . TOTAL KNEE ARTHROPLASTY Right 07/22/2017    There were no vitals filed for this visit.   Subjective Assessment - 01/26/21 0930    Subjective Same hip pain over past week but still trying to coordinate appointment with ortho.    Pertinent History heart transplant 6 yrs ago, ESRD, prostate CA, OSA, obesity, CHF, GERD, had DM, CVA with residual left hand weakness,  peripheral neuropathy that started about 6 months ago,  R THA, AFib    Patient Stated Goals to improve strength, stamina    Currently in Pain? No/denies   at this time.                            [place] Adult PT Treatment/Exercise - 01/26/21 0001      Lumbar Exercises: Stretches   Lower Trunk Rotation 5 reps   2 sets with 5 sec holds  Lumbar Exercises: Machines for Strengthening   Other Lumbar Machine Exercise rows and lats 25# 2x10      Lumbar Exercises: Seated   Sit to Stand 20 reps   requriing cues to prevent staying in forward flexion.     Lumbar Exercises: Supine   Ab Set 10 reps;3 seconds   ab iso with ball   Bridge with [employer] --   2x15with green ball -  cues for hip lift     Knee/Hip Exercises: Seated   Long Arc Quad Both;2 sets;10 reps    Long Arc Quad Weight 3 lbs.    Ball Squeeze 3 x 10- relieved right hip pain a bit    Other Seated Knee/Hip Exercises seated heel/toe raises 3x10 with 3" holds    Marching Both;10 reps   red TB, short range d/t R hip pain   Abduction/Adduction  Strengthening;Both;10 reps;3 sets   red TB                   PT Short Term Goals - 12/15/20 1141      PT SHORT TERM GOAL #1   Title Independent with initial HEP     Time 2    Period Weeks    Status Achieved    Target Date 12/20/20             PT Long Term Goals - 12/06/20 1022      PT LONG TERM GOAL #1   Title Berg improved to at least 50/56 to demonstrate decreased risk of falls    Baseline 32/56 - high fall risk    Time 8    Period Weeks    Status New    Target Date 02/07/21      PT LONG TERM GOAL #2   Title 5TSTS without UE support < 12 seconds to demonstrate improved functional LE strength    Baseline Unable to complete without use of hands, able to complete within 21 seconds using BUE assistance    Time 8    Period Weeks    Status New    Target Date 02/07/21      PT LONG TERM GOAL #3   Title 6MWT distancce traveled to at least 500 m to demonstrate improved aerobic capacity and endurance    Time 8    Period Weeks    Status New    Target Date 02/07/21      PT LONG TERM GOAL #4   Title Overall gait with improved symmetry, improved tolerance to prolonged walking with </= 2/10 LBP to facilitate safe participation in grocery shopping and community activities.    Time 8    Period Weeks    Status New    Target Date 02/07/21      PT LONG TERM GOAL #5   Title BLE strength at least 4+/5 with </= 2/10 LBP    Time 8    Period Weeks    Status New    Target Date 02/07/21                 Plan - 01/26/21 0956    Clinical Impression Statement Continues to be limited by R hip pain which seems to be most exacerbated with hip flexion positioning or leg lifting. This continues to limit standing tolerance for exercises, in addition to limited ability to complete sit to stands from lower seated surfaces. [name] squeezes continue to provide some relief to hip pain briefly. 1 instance of left HS cramp with  bridges that resolved with rest. Discussed with pt importance of continued follow up with MD about right hip pain. plan to continue to work on strength, endurance/conditioning and balance as tolerated - modify positioning as needed.     PT Treatment/Interventions ADLs/Self Care Home Management;Electrical Stimulation;Moist Heat;Cryotherapy;Gait training;Neuromuscular re-education;Balance training;Therapeutic exercise;Therapeutic activities;Functional mobility training;Patient/family education;Manual techniques;Energy conservation;Vestibular    PT Next Visit Plan Progress general strength, aerobic capacity/endurance with VS monitoring, balance as tolerated. May incorporate some LE/back flexibility. Manual/modalities for pain mgmt as needed.    Consulted and Agree with Plan of Care Patient           Patient will benefit from skilled therapeutic intervention in order to improve the following deficits and impairments:  Abnormal gait,Decreased range of motion,Difficulty walking,Increased muscle spasms,Decreased endurance,Cardiopulmonary status limiting activity,Pain,Impaired flexibility,Improper body mechanics,Decreased balance,Decreased mobility,Decreased strength,Impaired sensation  Visit Diagnosis: Abnormal posture  Muscle weakness (generalized)  Other abnormalities of gait and mobility  Unsteadiness on feet  Chronic low back pain, unspecified back pain laterality, unspecified whether sciatica present  History of falling     Problem List Patient Active Problem List   Diagnosis Date Noted  . Morbid obesity ([place]) 11/19/2019  . Sleep disorder 10/22/2019  . Sleep disturbance 10/22/2019  . Abnormality of gait 06/09/2019  . Diabetic polyneuropathy associated with diabetes mellitus due to underlying condition ([place]) 04/14/2019  . Peripheral neuropathy 12/11/2018  . CVA (cerebral vascular accident) ([place]) 11/09/2018  . H/O heart transplant ([place]) 11/09/2018  . Malignant neoplasm of prostate ([place]) 05/06/2018  . Closed posterior dislocation of hip, right, initial encounter ([place]) 08/24/2017  . End stage renal disease on dialysis ([place]) 08/24/2017  . Hip dislocation, right ([place]) 08/24/2017  . Osteoarthritis of right hip  07/22/2017  . VENTRICULAR TACHYCARDIA 12/21/2009  . Dyslipidemia 08/30/2009  . Essential hypertension 08/30/2009  . ISCHEMIC CARDIOMYOPATHY 08/30/2009  . Atrial fibrillation ([place]) 08/30/2009  . VENTRICULAR ECTOPY 08/30/2009  . CEREBROVASCULAR ACCIDENT, HX OF 08/30/2009  . Obesity, Class III, BMI 40-49.9 (morbid obesity) ([place]) 08/30/2009  . Diabetes mellitus type 2 in obese ([place]) 10/17/2007  . OBSTRUCTIVE SLEEP APNEA 10/17/2007  . CARDIOMYOPATHY, DILATED 10/17/2007    Hall Busing , PT, DPT 01/26/2021, 10:13 AM  [place]. [place], [place], [address] Phone: [phone]   Fax:  [phone]  Name: [name] MRN: [id number] Date of Birth: 1960/12/19

## 2021-01-27 DIAGNOSIS — N186 End stage renal disease: Secondary | ICD-10-CM | POA: Diagnosis not present

## 2021-01-27 DIAGNOSIS — Z992 Dependence on renal dialysis: Secondary | ICD-10-CM | POA: Diagnosis not present

## 2021-01-27 DIAGNOSIS — D509 Iron deficiency anemia, unspecified: Secondary | ICD-10-CM | POA: Diagnosis not present

## 2021-01-27 DIAGNOSIS — D631 Anemia in chronic kidney disease: Secondary | ICD-10-CM | POA: Diagnosis not present

## 2021-01-27 DIAGNOSIS — E1129 Type 2 diabetes mellitus with other diabetic kidney complication: Secondary | ICD-10-CM | POA: Diagnosis not present

## 2021-01-27 DIAGNOSIS — N2581 Secondary hyperparathyroidism of renal origin: Secondary | ICD-10-CM | POA: Diagnosis not present

## 2021-01-30 DIAGNOSIS — E1129 Type 2 diabetes mellitus with other diabetic kidney complication: Secondary | ICD-10-CM | POA: Diagnosis not present

## 2021-01-30 DIAGNOSIS — N186 End stage renal disease: Secondary | ICD-10-CM | POA: Diagnosis not present

## 2021-01-30 DIAGNOSIS — D509 Iron deficiency anemia, unspecified: Secondary | ICD-10-CM | POA: Diagnosis not present

## 2021-01-30 DIAGNOSIS — D631 Anemia in chronic kidney disease: Secondary | ICD-10-CM | POA: Diagnosis not present

## 2021-01-30 DIAGNOSIS — N2581 Secondary hyperparathyroidism of renal origin: Secondary | ICD-10-CM | POA: Diagnosis not present

## 2021-01-30 DIAGNOSIS — Z992 Dependence on renal dialysis: Secondary | ICD-10-CM | POA: Diagnosis not present

## 2021-01-31 ENCOUNTER — Ambulatory Visit: Payer: Medicare Other | Admitting: Physical Therapy

## 2021-01-31 ENCOUNTER — Other Ambulatory Visit: Payer: Self-pay

## 2021-01-31 DIAGNOSIS — M545 Low back pain, unspecified: Secondary | ICD-10-CM | POA: Diagnosis not present

## 2021-01-31 DIAGNOSIS — G8929 Other chronic pain: Secondary | ICD-10-CM

## 2021-01-31 DIAGNOSIS — R293 Abnormal posture: Secondary | ICD-10-CM | POA: Diagnosis not present

## 2021-01-31 DIAGNOSIS — M6281 Muscle weakness (generalized): Secondary | ICD-10-CM

## 2021-01-31 DIAGNOSIS — R2689 Other abnormalities of gait and mobility: Secondary | ICD-10-CM | POA: Diagnosis not present

## 2021-01-31 DIAGNOSIS — R2681 Unsteadiness on feet: Secondary | ICD-10-CM | POA: Diagnosis not present

## 2021-01-31 NOTE — Therapy (Signed)
[place]. [place], [place], [address] Phone: [phone]   Fax:  [phone]  Physical Therapy Treatment  Patient Details  Name: [name] MRN: [id number] Date of Birth: [birth year] Referring Provider (PT): Tylene Fantasia MD - [facility]   Encounter Date: 01/31/2021   PT End of Session - 01/31/21 0844    Visit Number 7    Number of Visits 17    Date for PT Re-Evaluation 02/07/21    Authorization Type Medicare & UHC    PT Start Time 0800    PT Stop Time 0845    PT Time Calculation (min) 45 min           Past Medical History:  Diagnosis Date  . Arthritis   . Atrial fibrillation ([place])   . CHF (congestive heart failure), NYHA class III (HCC)    s/p heart transplant  . Chronic kidney disease    end stage  . Chronic systolic dysfunction of left ventricle   . CVA (cerebral infarction)   . Diabetes mellitus    PMH; Prior to heart transplant  . Family history of coronary artery disease    in both parents  . GERD (gastroesophageal reflux disease)   . Hyperlipidemia   . Hypertension   . Left bundle branch block   . Morbid obesity ([place])    status post lap band  . Myocardial infarction [facility])    prior to heart transplant  . Nonischemic cardiomyopathy ([place])    prior to heart transplant  . Obesity (BMI 30-39.9)   . Obstructive sleep apnea    no longer needs CPAP after heart transplant per pt  . Premature ventricular contractions   . Prostate cancer ([place])   . SOB (shortness of breath)     Past Surgical History:  Procedure Laterality Date  . CARDIAC PACEMAKER PLACEMENT  09/21/2009   Biventricular implantable cardioverter-defibrillator implantation     . COLONOSCOPY W/ BIOPSIES AND POLYPECTOMY    . FOOT SURGERY     left  . HEART TRANSPLANT  2014  . LAPAROSCOPIC GASTRIC BANDING  01/27/2007  . LEFT VENTRICULAR ASSIST DEVICE     implant[facility]  . PROSTATE BIOPSY    . TOTAL HIP  ARTHROPLASTY Right 07/22/2017   Procedure: RIGHT TOTAL HIP ARTHROPLASTY ANTERIOR APPROACH;  Surgeon: Rod Can, MD;  Location: [place];  Service: Orthopedics;  Laterality: Right;  Needs RNFA  . TOTAL KNEE ARTHROPLASTY Right 07/22/2017    There were no vitals filed for this visit.   Subjective Assessment - 01/31/21 0804    Subjective doing okay, hip is the same- seeing ortho next week                             [place] [facility] Adult PT Treatment/Exercise - 01/31/21 0001      Lumbar Exercises: Aerobic   UBE (Upper Arm Bike) L 3 2 min fwd/2 min back    Nustep L5 x 6 min      Lumbar Exercises: Machines for Strengthening   Cybex Knee Extension 10# 2 sets 10    Cybex Knee Flexion 25# 2 sets 10    Other Lumbar Machine Exercise rows and lats 25# 2x10      Lumbar Exercises: Standing   Other Standing Lumbar Exercises hip 3 way 10 x BIL green tband   c/o increased LBP     Lumbar Exercises: Seated  Sit to Stand 20 reps   wt ball press 10 chest press, 10 OH     Lumbar Exercises: Supine   Ab Set 10 reps;3 seconds   iso with ball   Bridge Non-compliant;10 reps;3 seconds   obl and KTC 10 each     Knee/Hip Exercises: Standing   Walking with Sports Cord 30# 3 x  4 ways      Knee/Hip Exercises: Seated   Long Arc Quad --    Ball Squeeze 2 sets 10    Clamshell with TheraBand Green   20x   Marching Strengthening;Both;2 sets;10 reps   green tband                   PT Short Term Goals - 12/15/20 1141      PT SHORT TERM GOAL #1   Title Independent with initial HEP    Time 2    Period Weeks    Status Achieved    Target Date 12/20/20             PT Long Term Goals - 12/06/20 1022      PT LONG TERM GOAL #1   Title Berg improved to at least 50/56 to demonstrate decreased risk of falls    Baseline 32/56 - high fall risk    Time 8    Period Weeks    Status New    Target Date 02/07/21      PT LONG TERM GOAL #2   Title 5TSTS without UE support < 12 seconds  to demonstrate improved functional LE strength    Baseline Unable to complete without use of hands, able to complete within 21 seconds using BUE assistance    Time 8    Period Weeks    Status New    Target Date 02/07/21      PT LONG TERM GOAL #3   Title 6MWT distancce traveled to at least 500 m to demonstrate improved aerobic capacity and endurance    Time 8    Period Weeks    Status New    Target Date 02/07/21      PT LONG TERM GOAL #4   Title Overall gait with improved symmetry, improved tolerance to prolonged walking with </= 2/10 LBP to facilitate safe participation in grocery shopping and community activities.    Time 8    Period Weeks    Status New    Target Date 02/07/21      PT LONG TERM GOAL #5   Title BLE strength at least 4+/5 with </= 2/10 LBP    Time 8    Period Weeks    Status New    Target Date 02/07/21                 Plan - 01/31/21 0844    Clinical Impression Statement added LE machine interventions nad did well. increased LBP with hip ex and resisted walking- required seated rest and cues to prevent increased fwd flexion. tolerated tx well but c/o increased LBP and hip pain    PT Treatment/Interventions ADLs/Self Care Home Management;Electrical Stimulation;Moist Heat;Cryotherapy;Gait training;Neuromuscular re-education;Balance training;Therapeutic exercise;Therapeutic activities;Functional mobility training;Patient/family education;Manual techniques;Energy conservation;Vestibular    PT Next Visit Plan Progress general strength, aerobic capacity/endurance with VS monitoring, balance as tolerated. May incorporate some LE/back flexibility. Manual/modalities for pain mgmt as needed.           Patient will benefit from skilled therapeutic intervention in order to improve the following deficits and impairments:  Abnormal gait,Decreased range of motion,Difficulty walking,Increased muscle spasms,Decreased endurance,Cardiopulmonary status limiting  activity,Pain,Impaired flexibility,Improper body mechanics,Decreased balance,Decreased mobility,Decreased strength,Impaired sensation  Visit Diagnosis: Abnormal posture  Muscle weakness (generalized)  Chronic low back pain, unspecified back pain laterality, unspecified whether sciatica present     Problem List Patient Active Problem List   Diagnosis Date Noted  . Morbid obesity ([place]) 11/19/2019  . Sleep disorder 10/22/2019  . Sleep disturbance 10/22/2019  . Abnormality of gait 06/09/2019  . Diabetic polyneuropathy associated with diabetes mellitus due to underlying condition ([place]) 04/14/2019  . Peripheral neuropathy 12/11/2018  . CVA (cerebral vascular accident) ([place]) 11/09/2018  . H/O heart transplant ([place]) 11/09/2018  . Malignant neoplasm of prostate ([place]) 05/06/2018  . Closed posterior dislocation of hip, right, initial encounter ([place]) 08/24/2017  . End stage renal disease on dialysis ([place]) 08/24/2017  . Hip dislocation, right ([place]) 08/24/2017  . Osteoarthritis of right hip 07/22/2017  . VENTRICULAR TACHYCARDIA 12/21/2009  . Dyslipidemia 08/30/2009  . Essential hypertension 08/30/2009  . ISCHEMIC CARDIOMYOPATHY 08/30/2009  . Atrial fibrillation ([place]) 08/30/2009  . VENTRICULAR ECTOPY 08/30/2009  . CEREBROVASCULAR ACCIDENT, HX OF 08/30/2009  . Obesity, Class III, BMI 40-49.9 (morbid obesity) ([place]) 08/30/2009  . Diabetes mellitus type 2 in obese ([place]) 10/17/2007  . OBSTRUCTIVE SLEEP APNEA 10/17/2007  . CARDIOMYOPATHY, DILATED 10/17/2007    Attikus Bartoszek,ANGIE PTA 01/31/2021, 8:46 AM  [place]. [place], [place], [address] Phone: [phone]   Fax:  [phone]  Name: [name] MRN: [id number] Date of Birth: [birth year]

## 2021-02-01 DIAGNOSIS — D509 Iron deficiency anemia, unspecified: Secondary | ICD-10-CM | POA: Diagnosis not present

## 2021-02-01 DIAGNOSIS — N186 End stage renal disease: Secondary | ICD-10-CM | POA: Diagnosis not present

## 2021-02-01 DIAGNOSIS — D631 Anemia in chronic kidney disease: Secondary | ICD-10-CM | POA: Diagnosis not present

## 2021-02-01 DIAGNOSIS — Z992 Dependence on renal dialysis: Secondary | ICD-10-CM | POA: Diagnosis not present

## 2021-02-01 DIAGNOSIS — N2581 Secondary hyperparathyroidism of renal origin: Secondary | ICD-10-CM | POA: Diagnosis not present

## 2021-02-01 DIAGNOSIS — E1129 Type 2 diabetes mellitus with other diabetic kidney complication: Secondary | ICD-10-CM | POA: Diagnosis not present

## 2021-02-02 ENCOUNTER — Other Ambulatory Visit: Payer: Self-pay

## 2021-02-02 ENCOUNTER — Ambulatory Visit: Payer: Medicare Other | Admitting: Rehabilitative and Restorative Service Providers"

## 2021-02-02 ENCOUNTER — Encounter: Payer: Self-pay | Admitting: Rehabilitative and Restorative Service Providers"

## 2021-02-02 DIAGNOSIS — R293 Abnormal posture: Secondary | ICD-10-CM | POA: Diagnosis not present

## 2021-02-02 DIAGNOSIS — R2689 Other abnormalities of gait and mobility: Secondary | ICD-10-CM

## 2021-02-02 DIAGNOSIS — M6281 Muscle weakness (generalized): Secondary | ICD-10-CM

## 2021-02-02 DIAGNOSIS — M545 Low back pain, unspecified: Secondary | ICD-10-CM | POA: Diagnosis not present

## 2021-02-02 DIAGNOSIS — G8929 Other chronic pain: Secondary | ICD-10-CM

## 2021-02-02 DIAGNOSIS — Z9181 History of falling: Secondary | ICD-10-CM

## 2021-02-02 DIAGNOSIS — R2681 Unsteadiness on feet: Secondary | ICD-10-CM

## 2021-02-02 NOTE — Therapy (Signed)
[facility] [place], [place], [address] Phone: [phone]   Fax:  [phone]  Physical Therapy Treatment and Reassessment  Patient Details  Name: [name] MRN: [id number] Date of Birth: 1961-04-12 Referring Provider (PT): Tylene Fantasia MD - [facility]   Encounter Date: 02/02/2021   PT End of Session - 02/02/21 0809     Visit Number 8    Number of Visits 17    Date for PT Re-Evaluation 03/31/21    Authorization Type Medicare & UHC    PT Start Time 0801    PT Stop Time 0845    PT Time Calculation (min) 44 min    Activity Tolerance Patient tolerated treatment well;Patient limited by fatigue;Patient limited by pain    Behavior During Therapy [facility] for tasks assessed/performed             Past Medical History:  Diagnosis Date   Arthritis    Atrial fibrillation (HCC)    CHF (congestive heart failure), NYHA class III (HCC)    s/p heart transplant   Chronic kidney disease    end stage   Chronic systolic dysfunction of left ventricle    CVA (cerebral infarction)    Diabetes mellitus    PMH; Prior to heart transplant   Family history of coronary artery disease    in both parents   GERD (gastroesophageal reflux disease)    Hyperlipidemia    Hypertension    Left bundle branch block    Morbid obesity ([place])    status post lap band   Myocardial infarction [facility])    prior to heart transplant   Nonischemic cardiomyopathy ([place])    prior to heart transplant   Obesity (BMI 30-39.9)    Obstructive sleep apnea    no longer needs CPAP after heart transplant per pt   Premature ventricular contractions    Prostate cancer (HCC)    SOB (shortness of breath)     Past Surgical History:  Procedure Laterality Date   CARDIAC PACEMAKER PLACEMENT  09/21/2009   Biventricular implantable cardioverter-defibrillator implantation      COLONOSCOPY W/ BIOPSIES AND POLYPECTOMY     FOOT SURGERY     left    HEART TRANSPLANT  2014   LAPAROSCOPIC GASTRIC BANDING  01/27/2007   LEFT VENTRICULAR ASSIST DEVICE     implanted at [place] Right 07/22/2017   Procedure: RIGHT TOTAL HIP ARTHROPLASTY ANTERIOR APPROACH;  Surgeon: Rod Can, MD;  Location: [place];  Service: Orthopedics;  Laterality: Right;  Needs RNFA   TOTAL KNEE ARTHROPLASTY Right 07/22/2017    There were no vitals filed for this visit.   Subjective Assessment - 02/02/21 0807     Subjective My right hip is really bothering me, I think that I did too much last session.    Pertinent History heart transplant 6 yrs ago, ESRD, prostate CA, OSA, obesity, CHF, GERD, had DM, CVA with residual left hand weakness,  peripheral neuropathy that started about 6 months ago,  R THA, AFib    Patient Stated Goals to improve strength, stamina    Currently in Pain? Yes    Pain Score 8     Pain Location Hip    Pain Orientation Right    Pain Descriptors / Indicators Jabbing                [facility] PT Assessment - 02/02/21 0001  6 Minute Walk- Baseline   6 Minute Walk- Baseline yes    HR (bpm) 83    02 Sat (%RA) 96 %    Modified Borg Scale for Dyspnea 1- Very mild shortness of breath      6 Minute walk- Post Test   6 Minute Walk Post Test yes    HR (bpm) 98    02 Sat (%RA) 96 %    Modified Borg Scale for Dyspnea 2- Mild shortness of breath      6 minute walk test results    Aerobic Endurance Distance Walked 800    Endurance additional comments required a seated recovery period after 4 laps around the gym/half way point.                           [facility] Adult PT Treatment/Exercise - 02/02/21 0001       Transfers   Five time sit to stand comments  21.7 sec      Lumbar Exercises: Stretches   Lower Trunk Rotation 5 reps   2 sets with 5 sec hold     Lumbar Exercises: Aerobic   Nustep L5 x 6 min      Lumbar Exercises: Machines for Strengthening   Other Lumbar  Machine Exercise rows and lats 25# 2x10      Lumbar Exercises: Supine   Ab Set 10 reps;3 seconds      Knee/Hip Exercises: Seated   Long Arc Quad Both;2 sets;10 reps    Long Arc Quad Weight 5 lbs.                      PT Short Term Goals - 12/15/20 1141       PT SHORT TERM GOAL #1   Title Independent with initial HEP    Time 2    Period Weeks    Status Achieved    Target Date 12/20/20               PT Long Term Goals - 02/02/21 0919       PT LONG TERM GOAL #1   Title Merrilee Jansky improved to at least 50/56 to demonstrate decreased risk of falls    Status On-going      PT LONG TERM GOAL #2   Title 5TSTS without UE support < 12 seconds to demonstrate improved functional LE strength    Status On-going      PT LONG TERM GOAL #3   Title 6MWT distancce traveled to at least 500 m to demonstrate improved aerobic capacity and endurance    Status On-going      PT LONG TERM GOAL #4   Title Overall gait with improved symmetry, improved tolerance to prolonged walking with </= 2/10 LBP to facilitate safe participation in grocery shopping and community activities.    Status On-going      PT LONG TERM GOAL #5   Title BLE strength at least 4+/5 with </= 2/10 LBP    Status On-going                   Plan - 02/02/21 0852     Clinical Impression Statement Patient was limited today secondary to increased hip pain; he feels secondary to some of the increased exercise and use of weight machines last time.  Focused this visit primarily on reassessment tasks and conditioning.  He was limited on his sit to/from stand and required increased time  secondary to increased hip pain.  However, he did increase on distance with 6 minute walk test with decreased dyspnea reported.  He took a seated recovery period in the middle of assessment seconday to pain, not dyspnea.  He continues to progress with goal related activities, but has not yet reached his max potential and could benefit  from an additional 2x/week for 8 weeks.    Personal Factors and Comorbidities Comorbidity 3+    Stability/Clinical Decision Making Evolving/Moderate complexity    Clinical Decision Making Moderate    Rehab Potential Good    PT Frequency 2x / week    PT Duration 8 weeks    PT Treatment/Interventions ADLs/Self Care Home Management;Electrical Stimulation;Moist Heat;Cryotherapy;Gait training;Neuromuscular re-education;Balance training;Therapeutic exercise;Therapeutic activities;Functional mobility training;Patient/family education;Manual techniques;Energy conservation;Vestibular    PT Next Visit Plan Progress general strength, aerobic capacity/endurance with VS monitoring, balance as tolerated. May incorporate some LE/back flexibility. Manual/modalities for pain mgmt as needed.    Consulted and Agree with Plan of Care Patient             Patient will benefit from skilled therapeutic intervention in order to improve the following deficits and impairments:  Abnormal gait, Decreased range of motion, Difficulty walking, Increased muscle spasms, Decreased endurance, Cardiopulmonary status limiting activity, Pain, Impaired flexibility, Improper body mechanics, Decreased balance, Decreased mobility, Decreased strength, Impaired sensation  Visit Diagnosis: Abnormal posture - Plan: PT plan of care cert/re-cert  Muscle weakness (generalized) - Plan: PT plan of care cert/re-cert  Chronic low back pain, unspecified back pain laterality, unspecified whether sciatica present - Plan: PT plan of care cert/re-cert  Other abnormalities of gait and mobility - Plan: PT plan of care cert/re-cert  Unsteadiness on feet - Plan: PT plan of care cert/re-cert  History of falling - Plan: PT plan of care cert/re-cert     Problem List Patient Active Problem List   Diagnosis Date Noted   Morbid obesity ([place]) 11/19/2019   Sleep disorder 10/22/2019   Sleep disturbance 10/22/2019   Abnormality of gait 06/09/2019    Diabetic polyneuropathy associated with diabetes mellitus due to underlying condition ([place]) 04/14/2019   Peripheral neuropathy 12/11/2018   CVA (cerebral vascular accident) ([place]) 11/09/2018   H/O heart transplant ([place]) 11/09/2018   Malignant neoplasm of prostate ([place]) 05/06/2018   Closed posterior dislocation of hip, right, initial encounter ([place]) 08/24/2017   End stage renal disease on dialysis ([place]) 08/24/2017   Hip dislocation, right ([place]) 08/24/2017   Osteoarthritis of right hip 07/22/2017   VENTRICULAR TACHYCARDIA 12/21/2009   Dyslipidemia 08/30/2009   Essential hypertension 08/30/2009   ISCHEMIC CARDIOMYOPATHY 08/30/2009   Atrial fibrillation ([place]) 08/30/2009   VENTRICULAR ECTOPY 08/30/2009   CEREBROVASCULAR ACCIDENT, HX OF 08/30/2009   Obesity, Class III, BMI 40-49.9 (morbid obesity) ([place]) 08/30/2009   Diabetes mellitus type 2 in obese ([place]) 10/17/2007   OBSTRUCTIVE SLEEP APNEA 10/17/2007   CARDIOMYOPATHY, DILATED 10/17/2007    Juel Burrow, PT, DPT 02/02/2021, 9:46 AM  [facility]. [place], [place], [address] Phone: [phone]   Fax:  [phone]  Name: [name] MRN: [id number] Date of Birth: 05-17-1961

## 2021-02-03 DIAGNOSIS — D509 Iron deficiency anemia, unspecified: Secondary | ICD-10-CM | POA: Diagnosis not present

## 2021-02-03 DIAGNOSIS — N186 End stage renal disease: Secondary | ICD-10-CM | POA: Diagnosis not present

## 2021-02-03 DIAGNOSIS — D631 Anemia in chronic kidney disease: Secondary | ICD-10-CM | POA: Diagnosis not present

## 2021-02-03 DIAGNOSIS — N2581 Secondary hyperparathyroidism of renal origin: Secondary | ICD-10-CM | POA: Diagnosis not present

## 2021-02-03 DIAGNOSIS — E1129 Type 2 diabetes mellitus with other diabetic kidney complication: Secondary | ICD-10-CM | POA: Diagnosis not present

## 2021-02-03 DIAGNOSIS — Z992 Dependence on renal dialysis: Secondary | ICD-10-CM | POA: Diagnosis not present

## 2021-02-06 DIAGNOSIS — E1129 Type 2 diabetes mellitus with other diabetic kidney complication: Secondary | ICD-10-CM | POA: Diagnosis not present

## 2021-02-06 DIAGNOSIS — D509 Iron deficiency anemia, unspecified: Secondary | ICD-10-CM | POA: Diagnosis not present

## 2021-02-06 DIAGNOSIS — N2581 Secondary hyperparathyroidism of renal origin: Secondary | ICD-10-CM | POA: Diagnosis not present

## 2021-02-06 DIAGNOSIS — Z992 Dependence on renal dialysis: Secondary | ICD-10-CM | POA: Diagnosis not present

## 2021-02-06 DIAGNOSIS — D631 Anemia in chronic kidney disease: Secondary | ICD-10-CM | POA: Diagnosis not present

## 2021-02-06 DIAGNOSIS — N186 End stage renal disease: Secondary | ICD-10-CM | POA: Diagnosis not present

## 2021-02-08 DIAGNOSIS — D631 Anemia in chronic kidney disease: Secondary | ICD-10-CM | POA: Diagnosis not present

## 2021-02-08 DIAGNOSIS — E1129 Type 2 diabetes mellitus with other diabetic kidney complication: Secondary | ICD-10-CM | POA: Diagnosis not present

## 2021-02-08 DIAGNOSIS — N186 End stage renal disease: Secondary | ICD-10-CM | POA: Diagnosis not present

## 2021-02-08 DIAGNOSIS — Z992 Dependence on renal dialysis: Secondary | ICD-10-CM | POA: Diagnosis not present

## 2021-02-08 DIAGNOSIS — D509 Iron deficiency anemia, unspecified: Secondary | ICD-10-CM | POA: Diagnosis not present

## 2021-02-08 DIAGNOSIS — N2581 Secondary hyperparathyroidism of renal origin: Secondary | ICD-10-CM | POA: Diagnosis not present

## 2021-02-10 DIAGNOSIS — E1129 Type 2 diabetes mellitus with other diabetic kidney complication: Secondary | ICD-10-CM | POA: Diagnosis not present

## 2021-02-10 DIAGNOSIS — N2581 Secondary hyperparathyroidism of renal origin: Secondary | ICD-10-CM | POA: Diagnosis not present

## 2021-02-10 DIAGNOSIS — D631 Anemia in chronic kidney disease: Secondary | ICD-10-CM | POA: Diagnosis not present

## 2021-02-10 DIAGNOSIS — N186 End stage renal disease: Secondary | ICD-10-CM | POA: Diagnosis not present

## 2021-02-10 DIAGNOSIS — Z992 Dependence on renal dialysis: Secondary | ICD-10-CM | POA: Diagnosis not present

## 2021-02-10 DIAGNOSIS — Z96641 Presence of right artificial hip joint: Secondary | ICD-10-CM | POA: Diagnosis not present

## 2021-02-10 DIAGNOSIS — D509 Iron deficiency anemia, unspecified: Secondary | ICD-10-CM | POA: Diagnosis not present

## 2021-02-13 DIAGNOSIS — D509 Iron deficiency anemia, unspecified: Secondary | ICD-10-CM | POA: Diagnosis not present

## 2021-02-13 DIAGNOSIS — D631 Anemia in chronic kidney disease: Secondary | ICD-10-CM | POA: Diagnosis not present

## 2021-02-13 DIAGNOSIS — E1129 Type 2 diabetes mellitus with other diabetic kidney complication: Secondary | ICD-10-CM | POA: Diagnosis not present

## 2021-02-13 DIAGNOSIS — N2581 Secondary hyperparathyroidism of renal origin: Secondary | ICD-10-CM | POA: Diagnosis not present

## 2021-02-13 DIAGNOSIS — N186 End stage renal disease: Secondary | ICD-10-CM | POA: Diagnosis not present

## 2021-02-13 DIAGNOSIS — Z992 Dependence on renal dialysis: Secondary | ICD-10-CM | POA: Diagnosis not present

## 2021-02-14 ENCOUNTER — Ambulatory Visit: Payer: Medicare Other | Admitting: Rehabilitative and Restorative Service Providers"

## 2021-02-15 DIAGNOSIS — D631 Anemia in chronic kidney disease: Secondary | ICD-10-CM | POA: Diagnosis not present

## 2021-02-15 DIAGNOSIS — Z992 Dependence on renal dialysis: Secondary | ICD-10-CM | POA: Diagnosis not present

## 2021-02-15 DIAGNOSIS — N2581 Secondary hyperparathyroidism of renal origin: Secondary | ICD-10-CM | POA: Diagnosis not present

## 2021-02-15 DIAGNOSIS — D509 Iron deficiency anemia, unspecified: Secondary | ICD-10-CM | POA: Diagnosis not present

## 2021-02-15 DIAGNOSIS — E1129 Type 2 diabetes mellitus with other diabetic kidney complication: Secondary | ICD-10-CM | POA: Diagnosis not present

## 2021-02-15 DIAGNOSIS — N186 End stage renal disease: Secondary | ICD-10-CM | POA: Diagnosis not present

## 2021-02-16 ENCOUNTER — Ambulatory Visit: Payer: Medicare Other | Admitting: Rehabilitative and Restorative Service Providers"

## 2021-02-16 DIAGNOSIS — T8484XD Pain due to internal orthopedic prosthetic devices, implants and grafts, subsequent encounter: Secondary | ICD-10-CM | POA: Diagnosis not present

## 2021-02-16 DIAGNOSIS — M25551 Pain in right hip: Secondary | ICD-10-CM | POA: Diagnosis not present

## 2021-02-16 DIAGNOSIS — Z96641 Presence of right artificial hip joint: Secondary | ICD-10-CM | POA: Diagnosis not present

## 2021-02-17 DIAGNOSIS — N2581 Secondary hyperparathyroidism of renal origin: Secondary | ICD-10-CM | POA: Diagnosis not present

## 2021-02-17 DIAGNOSIS — D509 Iron deficiency anemia, unspecified: Secondary | ICD-10-CM | POA: Diagnosis not present

## 2021-02-17 DIAGNOSIS — Z992 Dependence on renal dialysis: Secondary | ICD-10-CM | POA: Diagnosis not present

## 2021-02-17 DIAGNOSIS — N186 End stage renal disease: Secondary | ICD-10-CM | POA: Diagnosis not present

## 2021-02-17 DIAGNOSIS — D631 Anemia in chronic kidney disease: Secondary | ICD-10-CM | POA: Diagnosis not present

## 2021-02-17 DIAGNOSIS — E1129 Type 2 diabetes mellitus with other diabetic kidney complication: Secondary | ICD-10-CM | POA: Diagnosis not present

## 2021-02-20 DIAGNOSIS — N2581 Secondary hyperparathyroidism of renal origin: Secondary | ICD-10-CM | POA: Diagnosis not present

## 2021-02-20 DIAGNOSIS — E1129 Type 2 diabetes mellitus with other diabetic kidney complication: Secondary | ICD-10-CM | POA: Diagnosis not present

## 2021-02-20 DIAGNOSIS — D509 Iron deficiency anemia, unspecified: Secondary | ICD-10-CM | POA: Diagnosis not present

## 2021-02-20 DIAGNOSIS — Z992 Dependence on renal dialysis: Secondary | ICD-10-CM | POA: Diagnosis not present

## 2021-02-20 DIAGNOSIS — N186 End stage renal disease: Secondary | ICD-10-CM | POA: Diagnosis not present

## 2021-02-20 DIAGNOSIS — D631 Anemia in chronic kidney disease: Secondary | ICD-10-CM | POA: Diagnosis not present

## 2021-02-21 ENCOUNTER — Ambulatory Visit: Payer: Medicare Other | Admitting: Rehabilitative and Restorative Service Providers"

## 2021-02-22 DIAGNOSIS — Z992 Dependence on renal dialysis: Secondary | ICD-10-CM | POA: Diagnosis not present

## 2021-02-22 DIAGNOSIS — D509 Iron deficiency anemia, unspecified: Secondary | ICD-10-CM | POA: Diagnosis not present

## 2021-02-22 DIAGNOSIS — N2581 Secondary hyperparathyroidism of renal origin: Secondary | ICD-10-CM | POA: Diagnosis not present

## 2021-02-22 DIAGNOSIS — N186 End stage renal disease: Secondary | ICD-10-CM | POA: Diagnosis not present

## 2021-02-22 DIAGNOSIS — E1129 Type 2 diabetes mellitus with other diabetic kidney complication: Secondary | ICD-10-CM | POA: Diagnosis not present

## 2021-02-22 DIAGNOSIS — D631 Anemia in chronic kidney disease: Secondary | ICD-10-CM | POA: Diagnosis not present

## 2021-02-23 ENCOUNTER — Ambulatory Visit: Payer: Medicare Other | Admitting: Rehabilitative and Restorative Service Providers"

## 2021-02-24 DIAGNOSIS — Z23 Encounter for immunization: Secondary | ICD-10-CM | POA: Diagnosis not present

## 2021-02-24 DIAGNOSIS — N186 End stage renal disease: Secondary | ICD-10-CM | POA: Diagnosis not present

## 2021-02-24 DIAGNOSIS — N2581 Secondary hyperparathyroidism of renal origin: Secondary | ICD-10-CM | POA: Diagnosis not present

## 2021-02-24 DIAGNOSIS — D509 Iron deficiency anemia, unspecified: Secondary | ICD-10-CM | POA: Diagnosis not present

## 2021-02-24 DIAGNOSIS — Z992 Dependence on renal dialysis: Secondary | ICD-10-CM | POA: Diagnosis not present

## 2021-02-24 DIAGNOSIS — E1129 Type 2 diabetes mellitus with other diabetic kidney complication: Secondary | ICD-10-CM | POA: Diagnosis not present

## 2021-02-24 DIAGNOSIS — T8620 Unspecified complication of heart transplant: Secondary | ICD-10-CM | POA: Diagnosis not present

## 2021-02-24 DIAGNOSIS — D631 Anemia in chronic kidney disease: Secondary | ICD-10-CM | POA: Diagnosis not present

## 2021-02-27 DIAGNOSIS — D509 Iron deficiency anemia, unspecified: Secondary | ICD-10-CM | POA: Diagnosis not present

## 2021-02-27 DIAGNOSIS — D631 Anemia in chronic kidney disease: Secondary | ICD-10-CM | POA: Diagnosis not present

## 2021-02-27 DIAGNOSIS — E1129 Type 2 diabetes mellitus with other diabetic kidney complication: Secondary | ICD-10-CM | POA: Diagnosis not present

## 2021-02-27 DIAGNOSIS — Z992 Dependence on renal dialysis: Secondary | ICD-10-CM | POA: Diagnosis not present

## 2021-02-27 DIAGNOSIS — N186 End stage renal disease: Secondary | ICD-10-CM | POA: Diagnosis not present

## 2021-02-27 DIAGNOSIS — N2581 Secondary hyperparathyroidism of renal origin: Secondary | ICD-10-CM | POA: Diagnosis not present

## 2021-03-01 DIAGNOSIS — E1129 Type 2 diabetes mellitus with other diabetic kidney complication: Secondary | ICD-10-CM | POA: Diagnosis not present

## 2021-03-01 DIAGNOSIS — D509 Iron deficiency anemia, unspecified: Secondary | ICD-10-CM | POA: Diagnosis not present

## 2021-03-01 DIAGNOSIS — N186 End stage renal disease: Secondary | ICD-10-CM | POA: Diagnosis not present

## 2021-03-01 DIAGNOSIS — D631 Anemia in chronic kidney disease: Secondary | ICD-10-CM | POA: Diagnosis not present

## 2021-03-01 DIAGNOSIS — Z992 Dependence on renal dialysis: Secondary | ICD-10-CM | POA: Diagnosis not present

## 2021-03-01 DIAGNOSIS — N2581 Secondary hyperparathyroidism of renal origin: Secondary | ICD-10-CM | POA: Diagnosis not present

## 2021-03-03 DIAGNOSIS — D631 Anemia in chronic kidney disease: Secondary | ICD-10-CM | POA: Diagnosis not present

## 2021-03-03 DIAGNOSIS — E1129 Type 2 diabetes mellitus with other diabetic kidney complication: Secondary | ICD-10-CM | POA: Diagnosis not present

## 2021-03-03 DIAGNOSIS — N186 End stage renal disease: Secondary | ICD-10-CM | POA: Diagnosis not present

## 2021-03-03 DIAGNOSIS — D509 Iron deficiency anemia, unspecified: Secondary | ICD-10-CM | POA: Diagnosis not present

## 2021-03-03 DIAGNOSIS — Z992 Dependence on renal dialysis: Secondary | ICD-10-CM | POA: Diagnosis not present

## 2021-03-03 DIAGNOSIS — N2581 Secondary hyperparathyroidism of renal origin: Secondary | ICD-10-CM | POA: Diagnosis not present

## 2021-03-06 DIAGNOSIS — N2581 Secondary hyperparathyroidism of renal origin: Secondary | ICD-10-CM | POA: Diagnosis not present

## 2021-03-06 DIAGNOSIS — N186 End stage renal disease: Secondary | ICD-10-CM | POA: Diagnosis not present

## 2021-03-06 DIAGNOSIS — D509 Iron deficiency anemia, unspecified: Secondary | ICD-10-CM | POA: Diagnosis not present

## 2021-03-06 DIAGNOSIS — E1129 Type 2 diabetes mellitus with other diabetic kidney complication: Secondary | ICD-10-CM | POA: Diagnosis not present

## 2021-03-06 DIAGNOSIS — D631 Anemia in chronic kidney disease: Secondary | ICD-10-CM | POA: Diagnosis not present

## 2021-03-06 DIAGNOSIS — Z992 Dependence on renal dialysis: Secondary | ICD-10-CM | POA: Diagnosis not present

## 2021-03-08 DIAGNOSIS — N2581 Secondary hyperparathyroidism of renal origin: Secondary | ICD-10-CM | POA: Diagnosis not present

## 2021-03-08 DIAGNOSIS — N186 End stage renal disease: Secondary | ICD-10-CM | POA: Diagnosis not present

## 2021-03-08 DIAGNOSIS — Z992 Dependence on renal dialysis: Secondary | ICD-10-CM | POA: Diagnosis not present

## 2021-03-08 DIAGNOSIS — E1129 Type 2 diabetes mellitus with other diabetic kidney complication: Secondary | ICD-10-CM | POA: Diagnosis not present

## 2021-03-08 DIAGNOSIS — D509 Iron deficiency anemia, unspecified: Secondary | ICD-10-CM | POA: Diagnosis not present

## 2021-03-08 DIAGNOSIS — D631 Anemia in chronic kidney disease: Secondary | ICD-10-CM | POA: Diagnosis not present

## 2021-03-10 DIAGNOSIS — N186 End stage renal disease: Secondary | ICD-10-CM | POA: Diagnosis not present

## 2021-03-10 DIAGNOSIS — Z992 Dependence on renal dialysis: Secondary | ICD-10-CM | POA: Diagnosis not present

## 2021-03-10 DIAGNOSIS — D631 Anemia in chronic kidney disease: Secondary | ICD-10-CM | POA: Diagnosis not present

## 2021-03-10 DIAGNOSIS — D509 Iron deficiency anemia, unspecified: Secondary | ICD-10-CM | POA: Diagnosis not present

## 2021-03-10 DIAGNOSIS — N2581 Secondary hyperparathyroidism of renal origin: Secondary | ICD-10-CM | POA: Diagnosis not present

## 2021-03-10 DIAGNOSIS — E1129 Type 2 diabetes mellitus with other diabetic kidney complication: Secondary | ICD-10-CM | POA: Diagnosis not present

## 2021-03-13 DIAGNOSIS — D631 Anemia in chronic kidney disease: Secondary | ICD-10-CM | POA: Diagnosis not present

## 2021-03-13 DIAGNOSIS — N2581 Secondary hyperparathyroidism of renal origin: Secondary | ICD-10-CM | POA: Diagnosis not present

## 2021-03-13 DIAGNOSIS — N186 End stage renal disease: Secondary | ICD-10-CM | POA: Diagnosis not present

## 2021-03-13 DIAGNOSIS — E1129 Type 2 diabetes mellitus with other diabetic kidney complication: Secondary | ICD-10-CM | POA: Diagnosis not present

## 2021-03-13 DIAGNOSIS — Z992 Dependence on renal dialysis: Secondary | ICD-10-CM | POA: Diagnosis not present

## 2021-03-13 DIAGNOSIS — D509 Iron deficiency anemia, unspecified: Secondary | ICD-10-CM | POA: Diagnosis not present

## 2021-03-15 DIAGNOSIS — N186 End stage renal disease: Secondary | ICD-10-CM | POA: Diagnosis not present

## 2021-03-15 DIAGNOSIS — N2581 Secondary hyperparathyroidism of renal origin: Secondary | ICD-10-CM | POA: Diagnosis not present

## 2021-03-15 DIAGNOSIS — D631 Anemia in chronic kidney disease: Secondary | ICD-10-CM | POA: Diagnosis not present

## 2021-03-15 DIAGNOSIS — Z992 Dependence on renal dialysis: Secondary | ICD-10-CM | POA: Diagnosis not present

## 2021-03-15 DIAGNOSIS — E1129 Type 2 diabetes mellitus with other diabetic kidney complication: Secondary | ICD-10-CM | POA: Diagnosis not present

## 2021-03-15 DIAGNOSIS — D509 Iron deficiency anemia, unspecified: Secondary | ICD-10-CM | POA: Diagnosis not present

## 2021-03-17 DIAGNOSIS — D509 Iron deficiency anemia, unspecified: Secondary | ICD-10-CM | POA: Diagnosis not present

## 2021-03-17 DIAGNOSIS — N186 End stage renal disease: Secondary | ICD-10-CM | POA: Diagnosis not present

## 2021-03-17 DIAGNOSIS — D631 Anemia in chronic kidney disease: Secondary | ICD-10-CM | POA: Diagnosis not present

## 2021-03-17 DIAGNOSIS — N2581 Secondary hyperparathyroidism of renal origin: Secondary | ICD-10-CM | POA: Diagnosis not present

## 2021-03-17 DIAGNOSIS — E1129 Type 2 diabetes mellitus with other diabetic kidney complication: Secondary | ICD-10-CM | POA: Diagnosis not present

## 2021-03-17 DIAGNOSIS — Z992 Dependence on renal dialysis: Secondary | ICD-10-CM | POA: Diagnosis not present

## 2021-03-20 DIAGNOSIS — E1129 Type 2 diabetes mellitus with other diabetic kidney complication: Secondary | ICD-10-CM | POA: Diagnosis not present

## 2021-03-20 DIAGNOSIS — D509 Iron deficiency anemia, unspecified: Secondary | ICD-10-CM | POA: Diagnosis not present

## 2021-03-20 DIAGNOSIS — N186 End stage renal disease: Secondary | ICD-10-CM | POA: Diagnosis not present

## 2021-03-20 DIAGNOSIS — N2581 Secondary hyperparathyroidism of renal origin: Secondary | ICD-10-CM | POA: Diagnosis not present

## 2021-03-20 DIAGNOSIS — D631 Anemia in chronic kidney disease: Secondary | ICD-10-CM | POA: Diagnosis not present

## 2021-03-20 DIAGNOSIS — Z992 Dependence on renal dialysis: Secondary | ICD-10-CM | POA: Diagnosis not present

## 2021-03-22 DIAGNOSIS — E1129 Type 2 diabetes mellitus with other diabetic kidney complication: Secondary | ICD-10-CM | POA: Diagnosis not present

## 2021-03-22 DIAGNOSIS — D631 Anemia in chronic kidney disease: Secondary | ICD-10-CM | POA: Diagnosis not present

## 2021-03-22 DIAGNOSIS — Z992 Dependence on renal dialysis: Secondary | ICD-10-CM | POA: Diagnosis not present

## 2021-03-22 DIAGNOSIS — D509 Iron deficiency anemia, unspecified: Secondary | ICD-10-CM | POA: Diagnosis not present

## 2021-03-22 DIAGNOSIS — N2581 Secondary hyperparathyroidism of renal origin: Secondary | ICD-10-CM | POA: Diagnosis not present

## 2021-03-22 DIAGNOSIS — N186 End stage renal disease: Secondary | ICD-10-CM | POA: Diagnosis not present

## 2021-03-24 DIAGNOSIS — N186 End stage renal disease: Secondary | ICD-10-CM | POA: Diagnosis not present

## 2021-03-24 DIAGNOSIS — Z992 Dependence on renal dialysis: Secondary | ICD-10-CM | POA: Diagnosis not present

## 2021-03-24 DIAGNOSIS — N2581 Secondary hyperparathyroidism of renal origin: Secondary | ICD-10-CM | POA: Diagnosis not present

## 2021-03-24 DIAGNOSIS — E1129 Type 2 diabetes mellitus with other diabetic kidney complication: Secondary | ICD-10-CM | POA: Diagnosis not present

## 2021-03-24 DIAGNOSIS — D509 Iron deficiency anemia, unspecified: Secondary | ICD-10-CM | POA: Diagnosis not present

## 2021-03-24 DIAGNOSIS — D631 Anemia in chronic kidney disease: Secondary | ICD-10-CM | POA: Diagnosis not present

## 2021-03-27 DIAGNOSIS — N2581 Secondary hyperparathyroidism of renal origin: Secondary | ICD-10-CM | POA: Diagnosis not present

## 2021-03-27 DIAGNOSIS — Z992 Dependence on renal dialysis: Secondary | ICD-10-CM | POA: Diagnosis not present

## 2021-03-27 DIAGNOSIS — N186 End stage renal disease: Secondary | ICD-10-CM | POA: Diagnosis not present

## 2021-03-27 DIAGNOSIS — T8620 Unspecified complication of heart transplant: Secondary | ICD-10-CM | POA: Diagnosis not present

## 2021-03-27 DIAGNOSIS — D631 Anemia in chronic kidney disease: Secondary | ICD-10-CM | POA: Diagnosis not present

## 2021-03-27 DIAGNOSIS — D509 Iron deficiency anemia, unspecified: Secondary | ICD-10-CM | POA: Diagnosis not present

## 2021-03-27 DIAGNOSIS — E1129 Type 2 diabetes mellitus with other diabetic kidney complication: Secondary | ICD-10-CM | POA: Diagnosis not present

## 2021-03-29 DIAGNOSIS — E1129 Type 2 diabetes mellitus with other diabetic kidney complication: Secondary | ICD-10-CM | POA: Diagnosis not present

## 2021-03-29 DIAGNOSIS — D509 Iron deficiency anemia, unspecified: Secondary | ICD-10-CM | POA: Diagnosis not present

## 2021-03-29 DIAGNOSIS — N2581 Secondary hyperparathyroidism of renal origin: Secondary | ICD-10-CM | POA: Diagnosis not present

## 2021-03-29 DIAGNOSIS — Z992 Dependence on renal dialysis: Secondary | ICD-10-CM | POA: Diagnosis not present

## 2021-03-29 DIAGNOSIS — D631 Anemia in chronic kidney disease: Secondary | ICD-10-CM | POA: Diagnosis not present

## 2021-03-29 DIAGNOSIS — N186 End stage renal disease: Secondary | ICD-10-CM | POA: Diagnosis not present

## 2021-03-30 DIAGNOSIS — I4891 Unspecified atrial fibrillation: Secondary | ICD-10-CM | POA: Diagnosis not present

## 2021-03-30 DIAGNOSIS — Z23 Encounter for immunization: Secondary | ICD-10-CM | POA: Diagnosis not present

## 2021-03-30 DIAGNOSIS — R9431 Abnormal electrocardiogram [ECG] [EKG]: Secondary | ICD-10-CM | POA: Diagnosis not present

## 2021-03-30 DIAGNOSIS — Z941 Heart transplant status: Secondary | ICD-10-CM | POA: Diagnosis not present

## 2021-03-30 DIAGNOSIS — I088 Other rheumatic multiple valve diseases: Secondary | ICD-10-CM | POA: Diagnosis not present

## 2021-03-30 DIAGNOSIS — Z8546 Personal history of malignant neoplasm of prostate: Secondary | ICD-10-CM | POA: Diagnosis not present

## 2021-03-30 DIAGNOSIS — I119 Hypertensive heart disease without heart failure: Secondary | ICD-10-CM | POA: Diagnosis not present

## 2021-03-30 DIAGNOSIS — Z48298 Encounter for aftercare following other organ transplant: Secondary | ICD-10-CM | POA: Diagnosis not present

## 2021-03-30 DIAGNOSIS — Z79899 Other long term (current) drug therapy: Secondary | ICD-10-CM | POA: Diagnosis not present

## 2021-03-30 DIAGNOSIS — I452 Bifascicular block: Secondary | ICD-10-CM | POA: Diagnosis not present

## 2021-03-30 DIAGNOSIS — G629 Polyneuropathy, unspecified: Secondary | ICD-10-CM | POA: Diagnosis not present

## 2021-03-30 DIAGNOSIS — M5137 Other intervertebral disc degeneration, lumbosacral region: Secondary | ICD-10-CM | POA: Diagnosis not present

## 2021-03-30 DIAGNOSIS — L988 Other specified disorders of the skin and subcutaneous tissue: Secondary | ICD-10-CM | POA: Diagnosis not present

## 2021-03-30 DIAGNOSIS — Z7901 Long term (current) use of anticoagulants: Secondary | ICD-10-CM | POA: Diagnosis not present

## 2021-03-30 DIAGNOSIS — Z4821 Encounter for aftercare following heart transplant: Secondary | ICD-10-CM | POA: Diagnosis not present

## 2021-03-30 DIAGNOSIS — Z298 Encounter for other specified prophylactic measures: Secondary | ICD-10-CM | POA: Diagnosis not present

## 2021-03-31 DIAGNOSIS — N2581 Secondary hyperparathyroidism of renal origin: Secondary | ICD-10-CM | POA: Diagnosis not present

## 2021-03-31 DIAGNOSIS — N186 End stage renal disease: Secondary | ICD-10-CM | POA: Diagnosis not present

## 2021-03-31 DIAGNOSIS — D509 Iron deficiency anemia, unspecified: Secondary | ICD-10-CM | POA: Diagnosis not present

## 2021-03-31 DIAGNOSIS — Z992 Dependence on renal dialysis: Secondary | ICD-10-CM | POA: Diagnosis not present

## 2021-03-31 DIAGNOSIS — E1129 Type 2 diabetes mellitus with other diabetic kidney complication: Secondary | ICD-10-CM | POA: Diagnosis not present

## 2021-03-31 DIAGNOSIS — D631 Anemia in chronic kidney disease: Secondary | ICD-10-CM | POA: Diagnosis not present

## 2021-04-03 DIAGNOSIS — N2581 Secondary hyperparathyroidism of renal origin: Secondary | ICD-10-CM | POA: Diagnosis not present

## 2021-04-03 DIAGNOSIS — N186 End stage renal disease: Secondary | ICD-10-CM | POA: Diagnosis not present

## 2021-04-03 DIAGNOSIS — E1129 Type 2 diabetes mellitus with other diabetic kidney complication: Secondary | ICD-10-CM | POA: Diagnosis not present

## 2021-04-03 DIAGNOSIS — D509 Iron deficiency anemia, unspecified: Secondary | ICD-10-CM | POA: Diagnosis not present

## 2021-04-03 DIAGNOSIS — D631 Anemia in chronic kidney disease: Secondary | ICD-10-CM | POA: Diagnosis not present

## 2021-04-03 DIAGNOSIS — Z992 Dependence on renal dialysis: Secondary | ICD-10-CM | POA: Diagnosis not present

## 2021-04-05 DIAGNOSIS — N2581 Secondary hyperparathyroidism of renal origin: Secondary | ICD-10-CM | POA: Diagnosis not present

## 2021-04-05 DIAGNOSIS — D631 Anemia in chronic kidney disease: Secondary | ICD-10-CM | POA: Diagnosis not present

## 2021-04-05 DIAGNOSIS — D509 Iron deficiency anemia, unspecified: Secondary | ICD-10-CM | POA: Diagnosis not present

## 2021-04-05 DIAGNOSIS — N186 End stage renal disease: Secondary | ICD-10-CM | POA: Diagnosis not present

## 2021-04-05 DIAGNOSIS — Z992 Dependence on renal dialysis: Secondary | ICD-10-CM | POA: Diagnosis not present

## 2021-04-05 DIAGNOSIS — E1129 Type 2 diabetes mellitus with other diabetic kidney complication: Secondary | ICD-10-CM | POA: Diagnosis not present

## 2021-04-07 DIAGNOSIS — D631 Anemia in chronic kidney disease: Secondary | ICD-10-CM | POA: Diagnosis not present

## 2021-04-07 DIAGNOSIS — D509 Iron deficiency anemia, unspecified: Secondary | ICD-10-CM | POA: Diagnosis not present

## 2021-04-07 DIAGNOSIS — E1129 Type 2 diabetes mellitus with other diabetic kidney complication: Secondary | ICD-10-CM | POA: Diagnosis not present

## 2021-04-07 DIAGNOSIS — N186 End stage renal disease: Secondary | ICD-10-CM | POA: Diagnosis not present

## 2021-04-07 DIAGNOSIS — Z992 Dependence on renal dialysis: Secondary | ICD-10-CM | POA: Diagnosis not present

## 2021-04-07 DIAGNOSIS — N2581 Secondary hyperparathyroidism of renal origin: Secondary | ICD-10-CM | POA: Diagnosis not present

## 2021-04-10 DIAGNOSIS — D631 Anemia in chronic kidney disease: Secondary | ICD-10-CM | POA: Diagnosis not present

## 2021-04-10 DIAGNOSIS — N186 End stage renal disease: Secondary | ICD-10-CM | POA: Diagnosis not present

## 2021-04-10 DIAGNOSIS — Z992 Dependence on renal dialysis: Secondary | ICD-10-CM | POA: Diagnosis not present

## 2021-04-10 DIAGNOSIS — D509 Iron deficiency anemia, unspecified: Secondary | ICD-10-CM | POA: Diagnosis not present

## 2021-04-10 DIAGNOSIS — N2581 Secondary hyperparathyroidism of renal origin: Secondary | ICD-10-CM | POA: Diagnosis not present

## 2021-04-10 DIAGNOSIS — E1129 Type 2 diabetes mellitus with other diabetic kidney complication: Secondary | ICD-10-CM | POA: Diagnosis not present

## 2021-04-12 DIAGNOSIS — N186 End stage renal disease: Secondary | ICD-10-CM | POA: Diagnosis not present

## 2021-04-12 DIAGNOSIS — D509 Iron deficiency anemia, unspecified: Secondary | ICD-10-CM | POA: Diagnosis not present

## 2021-04-12 DIAGNOSIS — E1129 Type 2 diabetes mellitus with other diabetic kidney complication: Secondary | ICD-10-CM | POA: Diagnosis not present

## 2021-04-12 DIAGNOSIS — N2581 Secondary hyperparathyroidism of renal origin: Secondary | ICD-10-CM | POA: Diagnosis not present

## 2021-04-12 DIAGNOSIS — Z992 Dependence on renal dialysis: Secondary | ICD-10-CM | POA: Diagnosis not present

## 2021-04-12 DIAGNOSIS — D631 Anemia in chronic kidney disease: Secondary | ICD-10-CM | POA: Diagnosis not present

## 2021-04-13 DIAGNOSIS — C61 Malignant neoplasm of prostate: Secondary | ICD-10-CM | POA: Diagnosis not present

## 2021-04-13 DIAGNOSIS — R310 Gross hematuria: Secondary | ICD-10-CM | POA: Diagnosis not present

## 2021-04-14 DIAGNOSIS — D509 Iron deficiency anemia, unspecified: Secondary | ICD-10-CM | POA: Diagnosis not present

## 2021-04-14 DIAGNOSIS — D631 Anemia in chronic kidney disease: Secondary | ICD-10-CM | POA: Diagnosis not present

## 2021-04-14 DIAGNOSIS — Z992 Dependence on renal dialysis: Secondary | ICD-10-CM | POA: Diagnosis not present

## 2021-04-14 DIAGNOSIS — N2581 Secondary hyperparathyroidism of renal origin: Secondary | ICD-10-CM | POA: Diagnosis not present

## 2021-04-14 DIAGNOSIS — E1129 Type 2 diabetes mellitus with other diabetic kidney complication: Secondary | ICD-10-CM | POA: Diagnosis not present

## 2021-04-14 DIAGNOSIS — N186 End stage renal disease: Secondary | ICD-10-CM | POA: Diagnosis not present

## 2021-04-17 DIAGNOSIS — N186 End stage renal disease: Secondary | ICD-10-CM | POA: Diagnosis not present

## 2021-04-17 DIAGNOSIS — D631 Anemia in chronic kidney disease: Secondary | ICD-10-CM | POA: Diagnosis not present

## 2021-04-17 DIAGNOSIS — D509 Iron deficiency anemia, unspecified: Secondary | ICD-10-CM | POA: Diagnosis not present

## 2021-04-17 DIAGNOSIS — E1129 Type 2 diabetes mellitus with other diabetic kidney complication: Secondary | ICD-10-CM | POA: Diagnosis not present

## 2021-04-17 DIAGNOSIS — Z992 Dependence on renal dialysis: Secondary | ICD-10-CM | POA: Diagnosis not present

## 2021-04-17 DIAGNOSIS — N2581 Secondary hyperparathyroidism of renal origin: Secondary | ICD-10-CM | POA: Diagnosis not present

## 2021-04-19 DIAGNOSIS — N186 End stage renal disease: Secondary | ICD-10-CM | POA: Diagnosis not present

## 2021-04-19 DIAGNOSIS — Z992 Dependence on renal dialysis: Secondary | ICD-10-CM | POA: Diagnosis not present

## 2021-04-19 DIAGNOSIS — D509 Iron deficiency anemia, unspecified: Secondary | ICD-10-CM | POA: Diagnosis not present

## 2021-04-19 DIAGNOSIS — N2581 Secondary hyperparathyroidism of renal origin: Secondary | ICD-10-CM | POA: Diagnosis not present

## 2021-04-19 DIAGNOSIS — E1129 Type 2 diabetes mellitus with other diabetic kidney complication: Secondary | ICD-10-CM | POA: Diagnosis not present

## 2021-04-19 DIAGNOSIS — D631 Anemia in chronic kidney disease: Secondary | ICD-10-CM | POA: Diagnosis not present

## 2021-04-21 DIAGNOSIS — E1129 Type 2 diabetes mellitus with other diabetic kidney complication: Secondary | ICD-10-CM | POA: Diagnosis not present

## 2021-04-21 DIAGNOSIS — D509 Iron deficiency anemia, unspecified: Secondary | ICD-10-CM | POA: Diagnosis not present

## 2021-04-21 DIAGNOSIS — N186 End stage renal disease: Secondary | ICD-10-CM | POA: Diagnosis not present

## 2021-04-21 DIAGNOSIS — D631 Anemia in chronic kidney disease: Secondary | ICD-10-CM | POA: Diagnosis not present

## 2021-04-21 DIAGNOSIS — N2581 Secondary hyperparathyroidism of renal origin: Secondary | ICD-10-CM | POA: Diagnosis not present

## 2021-04-21 DIAGNOSIS — Z992 Dependence on renal dialysis: Secondary | ICD-10-CM | POA: Diagnosis not present

## 2021-04-22 DIAGNOSIS — N2581 Secondary hyperparathyroidism of renal origin: Secondary | ICD-10-CM | POA: Diagnosis not present

## 2021-04-22 DIAGNOSIS — Z992 Dependence on renal dialysis: Secondary | ICD-10-CM | POA: Diagnosis not present

## 2021-04-22 DIAGNOSIS — D631 Anemia in chronic kidney disease: Secondary | ICD-10-CM | POA: Diagnosis not present

## 2021-04-22 DIAGNOSIS — N186 End stage renal disease: Secondary | ICD-10-CM | POA: Diagnosis not present

## 2021-04-22 DIAGNOSIS — D509 Iron deficiency anemia, unspecified: Secondary | ICD-10-CM | POA: Diagnosis not present

## 2021-04-22 DIAGNOSIS — E1129 Type 2 diabetes mellitus with other diabetic kidney complication: Secondary | ICD-10-CM | POA: Diagnosis not present

## 2021-04-23 ENCOUNTER — Other Ambulatory Visit: Payer: Self-pay

## 2021-04-23 ENCOUNTER — Encounter (HOSPITAL_COMMUNITY): Payer: Self-pay

## 2021-04-23 ENCOUNTER — Emergency Department (HOSPITAL_COMMUNITY): Payer: Medicare Other

## 2021-04-23 ENCOUNTER — Emergency Department (HOSPITAL_COMMUNITY)
Admission: EM | Admit: 2021-04-23 | Discharge: 2021-04-23 | Disposition: A | Discharge status: Home / Self Care | Payer: Medicare Other | Attending: Emergency Medicine | Admitting: Emergency Medicine

## 2021-04-23 DIAGNOSIS — I132 Hypertensive heart and chronic kidney disease with heart failure and with stage 5 chronic kidney disease, or end stage renal disease: Secondary | ICD-10-CM | POA: Diagnosis not present

## 2021-04-23 DIAGNOSIS — I5022 Chronic systolic (congestive) heart failure: Secondary | ICD-10-CM | POA: Insufficient documentation

## 2021-04-23 DIAGNOSIS — Z96641 Presence of right artificial hip joint: Secondary | ICD-10-CM | POA: Diagnosis not present

## 2021-04-23 DIAGNOSIS — R319 Hematuria, unspecified: Secondary | ICD-10-CM | POA: Diagnosis not present

## 2021-04-23 DIAGNOSIS — Z96651 Presence of right artificial knee joint: Secondary | ICD-10-CM | POA: Diagnosis not present

## 2021-04-23 DIAGNOSIS — N029 Recurrent and persistent hematuria with unspecified morphologic changes: Secondary | ICD-10-CM | POA: Diagnosis not present

## 2021-04-23 DIAGNOSIS — Z992 Dependence on renal dialysis: Secondary | ICD-10-CM | POA: Insufficient documentation

## 2021-04-23 DIAGNOSIS — N186 End stage renal disease: Secondary | ICD-10-CM | POA: Diagnosis not present

## 2021-04-23 DIAGNOSIS — I4891 Unspecified atrial fibrillation: Secondary | ICD-10-CM | POA: Diagnosis not present

## 2021-04-23 DIAGNOSIS — Z7982 Long term (current) use of aspirin: Secondary | ICD-10-CM | POA: Diagnosis not present

## 2021-04-23 DIAGNOSIS — I120 Hypertensive chronic kidney disease with stage 5 chronic kidney disease or end stage renal disease: Secondary | ICD-10-CM | POA: Diagnosis not present

## 2021-04-23 DIAGNOSIS — Z8546 Personal history of malignant neoplasm of prostate: Secondary | ICD-10-CM | POA: Insufficient documentation

## 2021-04-23 DIAGNOSIS — N3289 Other specified disorders of bladder: Secondary | ICD-10-CM | POA: Diagnosis not present

## 2021-04-23 DIAGNOSIS — E1122 Type 2 diabetes mellitus with diabetic chronic kidney disease: Secondary | ICD-10-CM | POA: Insufficient documentation

## 2021-04-23 DIAGNOSIS — N200 Calculus of kidney: Secondary | ICD-10-CM | POA: Diagnosis not present

## 2021-04-23 DIAGNOSIS — Z87891 Personal history of nicotine dependence: Secondary | ICD-10-CM | POA: Diagnosis not present

## 2021-04-23 DIAGNOSIS — Q619 Cystic kidney disease, unspecified: Secondary | ICD-10-CM | POA: Diagnosis not present

## 2021-04-23 LAB — CBC WITH DIFFERENTIAL/PLATELET
Abs Immature Granulocytes: 0.05 10*3/uL (ref 0.00–0.07)
Basophils Absolute: 0.1 10*3/uL (ref 0.0–0.1)
Basophils Relative: 1 %
Eosinophils Absolute: 0 10*3/uL (ref 0.0–0.5)
Eosinophils Relative: 0 %
HCT: 36.6 % — ABNORMAL LOW (ref 39.0–52.0)
Hemoglobin: 11.8 g/dL — ABNORMAL LOW (ref 13.0–17.0)
Immature Granulocytes: 1 %
Lymphocytes Relative: 8 %
Lymphs Abs: 0.7 10*3/uL (ref 0.7–4.0)
MCH: 30.7 pg (ref 26.0–34.0)
MCHC: 32.2 g/dL (ref 30.0–36.0)
MCV: 95.3 fL (ref 80.0–100.0)
Monocytes Absolute: 0.7 10*3/uL (ref 0.1–1.0)
Monocytes Relative: 7 %
Neutro Abs: 8.3 10*3/uL — ABNORMAL HIGH (ref 1.7–7.7)
Neutrophils Relative %: 83 %
Platelets: 222 10*3/uL (ref 150–400)
RBC: 3.84 MIL/uL — ABNORMAL LOW (ref 4.22–5.81)
RDW: 15.4 % (ref 11.5–15.5)
WBC: 9.8 10*3/uL (ref 4.0–10.5)
nRBC: 0 % (ref 0.0–0.2)

## 2021-04-23 LAB — BASIC METABOLIC PANEL
Anion gap: 16 — ABNORMAL HIGH (ref 5–15)
BUN: 26 mg/dL — ABNORMAL HIGH (ref 6–20)
CO2: 29 mmol/L (ref 22–32)
Calcium: 8.6 mg/dL — ABNORMAL LOW (ref 8.9–10.3)
Chloride: 92 mmol/L — ABNORMAL LOW (ref 98–111)
Creatinine, Ser: 7.58 mg/dL — ABNORMAL HIGH (ref 0.61–1.24)
GFR, Estimated: 8 mL/min — ABNORMAL LOW (ref >60)
Glucose, Bld: 113 mg/dL — ABNORMAL HIGH (ref 70–99)
Potassium: 4.4 mmol/L (ref 3.5–5.1)
Sodium: 137 mmol/L (ref 135–145)

## 2021-04-23 MED ORDER — OXYBUTYNIN CHLORIDE 5 MG PO TABS: 5.0000 mg | ORAL_TABLET | Freq: Three times a day (TID) | ORAL | 0 refills | Status: DC | PRN

## 2021-04-23 MED ORDER — OXYBUTYNIN CHLORIDE 5 MG PO TABS
5.0000 mg | ORAL_TABLET | Freq: Once | ORAL | Status: AC
  Administered 2021-04-23: 5 mg via ORAL
  Filled 2021-04-23: qty 1

## 2021-04-23 MED ORDER — OXYCODONE-ACETAMINOPHEN 5-325 MG PO TABS: 1.0000 | ORAL_TABLET | Freq: Three times a day (TID) | ORAL | 0 refills | Status: DC | PRN

## 2021-04-23 MED ORDER — SODIUM CHLORIDE 0.9 % IV SOLN: INTRAVENOUS | Status: DC

## 2021-04-23 MED ORDER — ONDANSETRON HCL 4 MG/2ML IJ SOLN
4.0000 mg | Freq: Once | INTRAMUSCULAR | Status: AC
  Administered 2021-04-23: 4 mg via INTRAVENOUS
  Filled 2021-04-23: qty 2

## 2021-04-23 MED ORDER — OXYCODONE-ACETAMINOPHEN 5-325 MG PO TABS
1.0000 | ORAL_TABLET | Freq: Once | ORAL | Status: AC
  Administered 2021-04-23: 1 via ORAL
  Filled 2021-04-23: qty 1

## 2021-04-23 MED ORDER — MORPHINE SULFATE (PF) 4 MG/ML IV SOLN
8.0000 mg | Freq: Once | INTRAVENOUS | Status: AC
  Administered 2021-04-23: 8 mg via INTRAVENOUS
  Filled 2021-04-23: qty 2

## 2021-04-23 NOTE — ED Triage Notes (Signed)
Pt reports hemturia for the past 2 days but started clotting this morning, pt  having severe pain in his penis. Hx of prostate cancer. Dialysis pt t/th/sat, went yesterday

## 2021-04-23 NOTE — ED Provider Notes (Signed)
[name]   CSN: [id number] Arrival date & time: 04/23/21  1141     History Chief Complaint  Patient presents with   Hematuria    [name] is a 60 y.o. male.  60 year old male presents with recurrent hematuria.  Patient recently started on Eliquis due to recent diagnosis of A. fib and anticipation of cardioversion.  States that he is on dialysis and does not make urine normally.  Complains of suprapubic pressure.  States he is currently having blood clots coming out the tip of his penis.  Is very uncomfortable.  Was seen by urology recently for same.  They are currently working this up.  Nothing makes his symptoms better      Past Medical History:  Diagnosis Date   Arthritis    Atrial fibrillation (HCC)    CHF (congestive heart failure), NYHA class III (HCC)    s/p heart transplant   Chronic kidney disease    end stage   Chronic systolic dysfunction of left ventricle    CVA (cerebral infarction)    Diabetes mellitus    PMH; Prior to heart transplant   Family history of coronary artery disease    in both parents   GERD (gastroesophageal reflux disease)    Hyperlipidemia    Hypertension    Left bundle branch block    Morbid obesity (HCC)    status post lap band   Myocardial infarction [facility])    prior to heart transplant   Nonischemic cardiomyopathy ([place])    prior to heart transplant   Obesity (BMI 30-39.9)    Obstructive sleep apnea    no longer needs CPAP after heart transplant per pt   Premature ventricular contractions    Prostate cancer ([place])    SOB (shortness of breath)     Patient Active Problem List   Diagnosis Date Noted   Morbid obesity ([place]) 11/19/2019   Sleep disorder 10/22/2019   Sleep disturbance 10/22/2019   Abnormality of gait 06/09/2019   Diabetic polyneuropathy associated with diabetes mellitus due to underlying condition ([place]) 04/14/2019   Peripheral neuropathy 12/11/2018   CVA  (cerebral vascular accident) ([place]) 11/09/2018   H/O heart transplant ([place]) 11/09/2018   Malignant neoplasm of prostate ([place]) 05/06/2018   Closed posterior dislocation of hip, right, initial encounter ([place]) 08/24/2017   End stage renal disease on dialysis ([place]) 08/24/2017   Hip dislocation, right ([place]) 08/24/2017   Osteoarthritis of right hip 07/22/2017   VENTRICULAR TACHYCARDIA 12/21/2009   Dyslipidemia 08/30/2009   Essential hypertension 08/30/2009   ISCHEMIC CARDIOMYOPATHY 08/30/2009   Atrial fibrillation ([place]) 08/30/2009   VENTRICULAR ECTOPY 08/30/2009   CEREBROVASCULAR ACCIDENT, HX OF 08/30/2009   Obesity, Class III, BMI 40-49.9 (morbid obesity) ([place]) 08/30/2009   Diabetes mellitus type 2 in obese ([place]) 10/17/2007   OBSTRUCTIVE SLEEP APNEA 10/17/2007   CARDIOMYOPATHY, DILATED 10/17/2007    Past Surgical History:  Procedure Laterality Date   CARDIAC PACEMAKER PLACEMENT  09/21/2009   Biventricular implantable cardioverter-defibrillator implantation      COLONOSCOPY W/ BIOPSIES AND POLYPECTOMY     FOOT SURGERY     left   HEART TRANSPLANT  2014   LAPAROSCOPIC GASTRIC BANDING  01/27/2007   LEFT VENTRICULAR ASSIST DEVICE     implanted at [place] Right 07/22/2017   Procedure: RIGHT TOTAL HIP ARTHROPLASTY ANTERIOR APPROACH;  Surgeon: [name] Can, [name];  Location: [facility];  Service: Orthopedics;  Laterality: Right;  Needs RNFA   TOTAL KNEE ARTHROPLASTY Right 07/22/2017       Family History  Problem Relation Age of Onset   Coronary artery disease Father        had, PTCA & CABG   Heart attack Father    Hypertension Father    Diabetes Father    Prostate cancer Father    Coronary artery disease Mother    Heart attack Mother    Hypertension Mother    Stroke Mother    Lupus Mother    Prostate cancer Brother    Coronary artery disease Brother    Coronary artery disease Sister    Heart attack Sister    Prostate cancer Paternal Uncle     Coronary artery disease Maternal Grandmother    Coronary artery disease Maternal Grandfather    Coronary artery disease Paternal Grandmother    Coronary artery disease Paternal Grandfather     Social History   Tobacco Use   Smoking status: Never   Smokeless tobacco: Former    Types: Snuff  Vaping Use   Vaping Use: Never used  Substance Use Topics   Alcohol use: No   Drug use: No    Home Medications Prior to Admission medications   Medication Sig Start Date End Date Taking? Authorizing Provider  amitriptyline (ELAVIL) 25 MG tablet TAKE 1 TABLET(25 MG) BY MOUTH AT BEDTIME 06/20/20   [name] Arn, [name]  aspirin EC 81 MG tablet Take 81 mg by mouth daily.    Provider, Historical, [name]  B Complex-C-Folic Acid ([place]ENO CAPS) 1 MG CAPS Take 1 capsule by mouth daily. 08/09/17   Provider, Historical, [name]  citalopram (CELEXA) 20 MG tablet Take 20 mg by mouth daily. 04/06/20   Provider, Historical, [name]  cycloSPORINE modified (NEORAL) 100 MG capsule Take 100 mg by mouth 2 (two) times daily.    Provider, Historical, [name]  cycloSPORINE modified (NEORAL) 25 MG capsule Take by mouth 2 (two) times daily.    Provider, Historical, [name]  docusate sodium (COLACE) 100 MG capsule Take 100 mg by mouth 2 (two) times daily.    Provider, Historical, [name]  DULoxetine (CYMBALTA) 30 MG capsule Take 60 mg by mouth daily.    Provider, Historical, [name]  guaifenesin (HUMIBID E) 400 MG TABS tablet Take 400 mg by mouth every 4 (four) hours.    Provider, Historical, [name]  lidocaine (XYLOCAINE) 5 % ointment Apply 1 application topically as needed. 04/14/19   [name] Arn, [name]  lidocaine-prilocaine (EMLA) cream Apply 1 application topically as needed.    Provider, Historical, [name]  midodrine (PROAMATINE) 10 MG tablet Take 10 tablets by mouth daily. 05/03/20   Provider, Historical, [name]  midodrine (PROAMATINE) 5 MG tablet midodrine 5 mg tablet 02/18/20   Provider, Historical, [name]  mycophenolate (MYFORTIC) 360 MG TBEC EC tablet Take 720  mg by mouth 2 (two) times daily.    Provider, Historical, [name]  omeprazole (PRILOSEC) 20 MG capsule Take 20 mg by mouth daily.  04/06/16   Provider, Historical, [name]  ondansetron (ZOFRAN) 4 MG tablet Take 1 tablet (4 mg total) by mouth every 6 (six) hours as needed for nausea. Patient taking differently: Take 4 mg by mouth daily.  07/23/17   [name], [name] Edelman, [name]  OXcarbazepine (TRILEPTAL) 150 MG tablet Take 1 tablet (150 mg total) by mouth 2 (two) times daily. 12/11/18   [name] Pacas, [name]  pravastatin (PRAVACHOL) 40 MG tablet Take 1 tablet (40 mg total) by mouth daily at 6  PM. Patient taking differently: Take 20 mg by mouth at bedtime.  07/24/17   [name], [name] Edelman, [name]  pregabalin (LYRICA) 25 MG capsule TAKE 5 CAPSULES BY MOUTH AFTER DIALYSIS 06/09/20   [name] Arn, [name]  pregabalin (LYRICA) 75 MG capsule TAKE 1 CAPSULE(75 MG) BY MOUTH DAILY 07/12/20   [name] Arn, [name]  rosuvastatin (CRESTOR) 10 MG tablet Take 1 tablet by mouth at bedtime. 12/01/19   Provider, Historical, [name]  senna (SENOKOT) 8.6 MG TABS tablet Take 2 tablets (17.2 mg total) by mouth at bedtime. Patient taking differently: Take 2 tablets by mouth daily as needed for mild constipation.  07/23/17   [name], [name] Edelman, [name]  sevelamer carbonate (RENVELA) 800 MG tablet Take 2 tablets (1,600 mg total) by mouth 3 (three) times daily with meals. Patient taking differently: Take 800 mg by mouth 3 (three) times daily with meals.  11/11/18   Kayleen Memos, DO  temazepam (RESTORIL) 15 MG capsule Take 15 mg by mouth at bedtime as needed for sleep.    Provider, Historical, [name]    Allergies    Heparin and Penicillin g potassium [penicillin g]  Review of Systems   Review of Systems  All other systems reviewed and are negative.  Physical Exam Updated Vital Signs BP (!) 126/94   Pulse (!) 112   Temp 97.9 F (36.6 C) (Oral)   Resp 18   SpO2 99%   Physical Exam Vitals and nursing [name] reviewed.  Constitutional:      General: He is not in acute  distress.    Appearance: Normal appearance. He is well-developed. He is not toxic-appearing.  HENT:     Head: Normocephalic and atraumatic.  Eyes:     General: Lids are normal.     Conjunctiva/sclera: Conjunctivae normal.     Pupils: Pupils are equal, round, and reactive to light.  Neck:     Thyroid: No thyroid mass.     Trachea: No tracheal deviation.  Cardiovascular:     Rate and Rhythm: Normal rate and regular rhythm.     Heart sounds: Normal heart sounds. No murmur heard.   No gallop.  Pulmonary:     Effort: Pulmonary effort is normal. No respiratory distress.     Breath sounds: Normal breath sounds. No stridor. No decreased breath sounds, wheezing, rhonchi or rales.  Abdominal:     General: There is no distension.     Palpations: Abdomen is soft.     Tenderness: There is no abdominal tenderness. There is no rebound.  Genitourinary:    Comments: Blood noted at meatus.  No suprapubic pressure Musculoskeletal:        General: No tenderness. Normal range of motion.     Cervical back: Normal range of motion and neck supple.  Skin:    General: Skin is warm and dry.     Findings: No abrasion or rash.  Neurological:     Mental Status: He is alert and oriented to person, place, and time. Mental status is at baseline.     GCS: GCS eye subscore is 4. GCS verbal subscore is 5. GCS motor subscore is 6.     Cranial Nerves: Cranial nerves are intact. No cranial nerve deficit.     Sensory: No sensory deficit.     Motor: Motor function is intact.  Psychiatric:        Attention and Perception: Attention normal.        Speech: Speech normal.  Behavior: Behavior normal.    ED Results / Procedures / Treatments   Labs (all labs ordered are listed, but only abnormal results are displayed) Labs Reviewed  CBC WITH DIFFERENTIAL/PLATELET  BASIC METABOLIC PANEL  URINALYSIS, ROUTINE W REFLEX MICROSCOPIC    EKG None  Radiology No results found.  Procedures Procedures    Medications Ordered in ED Medications - No data to display  ED Course  I have reviewed the triage vital signs and the nursing notes.  Pertinent labs & imaging results that were available during my care of the patient were reviewed by me and considered in my medical decision making (see chart for details).    MDM Rules/Calculators/A&P                           Patient medicated for pain here.  Discussed with urology recommends recontacting after results of CT scan.  Case turned over to Dr. Alvino Chapel Final Clinical Impression(s) / ED Diagnoses Final diagnoses:  None    Rx / DC Orders ED Discharge Orders     None        Lacretia Leigh, [name] 04/23/21 1517

## 2021-04-23 NOTE — Discharge Instructions (Addendum)
The oxybutynin may help with the spasms and pain. watch for lightheadedness and dizziness.  Follow-up with cardiology and follow-up with urology.  If feeling worse return to the ER.

## 2021-04-23 NOTE — ED Notes (Signed)
Bladder scan showed 37 mL.

## 2021-04-23 NOTE — Treatment Plan (Signed)
UROLOGY TREATMENT PLAN:  60 yo M w/ Hx of PCa s/p Brachy w/ IMRT and ADT, ESRD on HD, and recent (~ 1 month) or blood oozing from urethral meatus. He is having suprapubic and penile discomfort related to this, so he presented to the ED.  In the ED: AF, HDS Hgb 11.8 (stable from 10.9 on 02/10/20) Cr. 7.58 consistent w/ ESRD Mild ooze of blood from meatus CT w/ some blood products within the bladder without any significant bladder distension and no hydronephrosis  Assessment/Plan: Given that the patient has a stable Hgb, it is unlikely that he is loosing a significant amount of blood in his bladder/urinary tract. Given that stability and that he is on HD, we favor not intervening to clear any of the blood clot or attempt to stop the bleeding. Ideally the blood clots will stop the bleeding spontaneously.   His discomfort is likely related to the mild distension of his bladder, which is not used to being distended 2/2 his anuria. Would recommend anticholinergics (Oxybutynin, B&O suppository) for bladder relaxation.   The etiology of his bleeding is still unclear (prostatic, radiation cystitis, GU malignancy), and he is currently undergoing an outpatient workup for this.   Reola Mosher [facility] Urology Resident  Discussed with Dr. Abner Greenspan.

## 2021-04-24 ENCOUNTER — Other Ambulatory Visit: Payer: Self-pay | Admitting: Urology

## 2021-04-24 DIAGNOSIS — R338 Other retention of urine: Secondary | ICD-10-CM | POA: Diagnosis not present

## 2021-04-24 DIAGNOSIS — R310 Gross hematuria: Secondary | ICD-10-CM | POA: Diagnosis not present

## 2021-04-24 NOTE — Progress Notes (Signed)
Please place PAT order for appointment 04/25/21.

## 2021-04-24 NOTE — Progress Notes (Signed)
COVID swab appointment: N/a  COVID Vaccine Completed: yes x3 Date COVID Vaccine completed: 05/03/20, 11/10/20 Has received booster: 03/30/21 COVID vaccine manufacturer: Pfizer    Moderna     Date of COVID positive in last 90 days:  PCP - Carol Ada, MD Cardiologist -   Chest x-ray -  EKG - 03/30/21 [facility] requested Stress Test - 12/08/19 Care everywhere ECHO - 03/30/21 Care everywhere  Cardiac Cath - 05/03/20 Care Everywhere Pacemaker/ICD device last checked: Spinal Cord Stimulator:  Sleep Study -  CPAP -   Fasting Blood Sugar -  Checks Blood Sugar _____ times a day  Blood Thinner Instructions: Eliquis Aspirin Instructions: ASA 81 Last Dose:  Activity level:  Can go up a flight of stairs and perform activities of daily living without stopping and without symptoms of chest pain or shortness of breath.   Able to exercise without symptoms  Unable to go up a flight of stairs without symptoms of      Anesthesia review: heart transplant,  HTN, cardiomyopathy, a fib, CVA, DM, OSA, CHF, LBBB, MI, dialysis  Patient denies shortness of breath, fever, cough and chest pain at PAT appointment   Patient verbalized understanding of instructions that were given to them at the PAT appointment. Patient was also instructed that they will need to review over the PAT instructions again at home before surgery.

## 2021-04-24 NOTE — Patient Instructions (Addendum)
DUE TO COVID-19 ONLY ONE VISITOR IS ALLOWED TO COME WITH YOU AND STAY IN THE WAITING ROOM ONLY DURING PRE OP AND PROCEDURE.   **NO VISITORS ARE ALLOWED IN THE SHORT STAY AREA OR RECOVERY ROOM!!**   Your procedure is scheduled on: 04/27/21   Report to [facility] Main  Entrance    Report to admitting at 1:15 PM   Call this number if you have problems the morning of surgery [phone]   Do not eat food :After Midnight.   May have liquids until 12:30 PM day of surgery  CLEAR LIQUID DIET  Foods Allowed                                                                     Foods Excluded  Water, Black Coffee and tea (No creamer or milk)           liquids that you cannot  Plain Jell-O in any flavor  (No red)                                    see through such as: Fruit ices (not with fruit pulp)                                            milk, soups, orange juice              Iced Popsicles (No red)                                                All solid food                                   Apple juices Sports drinks like Gatorade (No red) Lightly seasoned clear broth or consume(fat free) Sugar       Oral Hygiene is also important to reduce your risk of infection.                                    Remember - BRUSH YOUR TEETH THE MORNING OF SURGERY WITH YOUR REGULAR TOOTHPASTE   Do NOT smoke after Midnight  Take these medicines the morning of surgery with A SIP OF WATER: Citalopram, Cyclosporine,  Omeprazole, , Ditropan, Percocet     Reviewed and Endorsed by [facility] Patient Education Committee, August 2015                               You may not have any metal on your body including jewelry, and body piercing             Do not wear lotions, powders, cologne, or deodorant  Men may shave face and neck.   Do not bring valuables to the hospital. [name].   Contacts, dentures or bridgework may not be worn into  surgery.    Patients discharged the day of surgery will not be allowed to drive home.    Please read over the following fact sheets you were given: IF YOU HAVE QUESTIONS ABOUT YOUR PRE OP INSTRUCTIONS PLEASE CALL [place] - Preparing for Surgery Before surgery, you can play an important role.  Because skin is not sterile, your skin needs to be as free of germs as possible.  You can reduce the number of germs on your skin by washing with CHG (chlorahexidine gluconate) soap before surgery.  CHG is an antiseptic cleaner which kills germs and bonds with the skin to continue killing germs even after washing. Please DO NOT use if you have an allergy to CHG or antibacterial soaps.  If your skin becomes reddened/irritated stop using the CHG and inform your nurse when you arrive at Short Stay. Do not shave (including legs and underarms) for at least 48 hours prior to the first CHG shower.  You may shave your face/neck.  Please follow these instructions carefully:  1.  Shower with CHG Soap the night before surgery and the  morning of surgery.  2.  If you choose to wash your hair, wash your hair first as usual with your normal  shampoo.  3.  After you shampoo, rinse your hair and body thoroughly to remove the shampoo.                             4.  Use CHG as you would any other liquid soap.  You can apply chg directly to the skin and wash.  Gently with a scrungie or clean washcloth.  5.  Apply the CHG Soap to your body ONLY FROM THE NECK DOWN.   Do   not use on face/ open                           Wound or open sores. Avoid contact with eyes, ears mouth and   genitals (private parts).                       Wash face,  Genitals (private parts) with your normal soap.             6.  Wash thoroughly, paying special attention to the area where your    surgery  will be performed.  7.  Thoroughly rinse your body with warm water from the neck down.  8.  DO NOT shower/wash with your  normal soap after using and rinsing off the CHG Soap.                9.  Pat yourself dry with a clean towel.            10.  Wear clean pajamas.            11.  Place clean sheets on your bed the night of your first shower and do not  sleep with pets. Day of Surgery : Do not apply any lotions/deodorants the morning of surgery.  Please wear clean clothes to the hospital/surgery center.  FAILURE TO FOLLOW THESE INSTRUCTIONS MAY RESULT IN THE CANCELLATION OF  YOUR SURGERY  PATIENT SIGNATURE_________________________________  NURSE SIGNATURE__________________________________  ________________________________________________________________________

## 2021-04-25 ENCOUNTER — Encounter (HOSPITAL_COMMUNITY): Payer: Self-pay

## 2021-04-25 ENCOUNTER — Encounter (HOSPITAL_COMMUNITY)
Admission: RE | Admit: 2021-04-25 | Discharge: 2021-04-25 | Disposition: A | Discharge status: Home / Self Care | Payer: Medicare Other | Source: Ambulatory Visit | Attending: Urology | Admitting: Urology

## 2021-04-25 ENCOUNTER — Other Ambulatory Visit: Payer: Self-pay

## 2021-04-25 DIAGNOSIS — Z01812 Encounter for preprocedural laboratory examination: Secondary | ICD-10-CM | POA: Diagnosis not present

## 2021-04-25 LAB — COMPREHENSIVE METABOLIC PANEL
ALT: 14 U/L (ref 0–44)
AST: 12 U/L — ABNORMAL LOW (ref 15–41)
Albumin: 3.5 g/dL (ref 3.5–5.0)
Alkaline Phosphatase: 68 U/L (ref 38–126)
Anion gap: 18 — ABNORMAL HIGH (ref 5–15)
BUN: 51 mg/dL — ABNORMAL HIGH (ref 6–20)
CO2: 26 mmol/L (ref 22–32)
Calcium: 8.1 mg/dL — ABNORMAL LOW (ref 8.9–10.3)
Chloride: 94 mmol/L — ABNORMAL LOW (ref 98–111)
Creatinine, Ser: 10.8 mg/dL — ABNORMAL HIGH (ref 0.61–1.24)
GFR, Estimated: 5 mL/min — ABNORMAL LOW (ref >60)
Glucose, Bld: 88 mg/dL (ref 70–99)
Potassium: 4.9 mmol/L (ref 3.5–5.1)
Sodium: 138 mmol/L (ref 135–145)
Total Bilirubin: 1.4 mg/dL — ABNORMAL HIGH (ref 0.3–1.2)
Total Protein: 7.3 g/dL (ref 6.5–8.1)

## 2021-04-25 LAB — GLUCOSE, CAPILLARY: Glucose-Capillary: 90 mg/dL (ref 70–99)

## 2021-04-25 NOTE — Progress Notes (Addendum)
COVID swab appointment: N/A   COVID Vaccine Completed: yes x4 Date COVID Vaccine completed: 05/03/20, 11/10/20 Has received booster:  Yes x2 03/30/21 COVID vaccine manufacturer: Pfizer    Moderna      Date of COVID positive in last 25 days:N/A   PCP - Carol Ada, MD Cardiologist - Dr. Posey Pronto at [facility]   Chest x-ray - N/A EKG - 03/30/21 [facility] On chart Stress Test - 12/08/19 Care everywhere ECHO - 03/30/21 Care everywhere  Cardiac Cath - 05/03/20 Care Everywhere Pacemaker/ICD device last checked: Spinal Cord Stimulator:   Sleep Study -  Yes, +sleep apnea.  Lost weight and no longer has issues CPAP - No   Fasting Blood Sugar - does not check Checks Blood Sugar _____ times a day   Blood Thinner Instructions: Eliquis.  Last dose 04-23-21 Aspirin Instructions: ASA 81 Last dose 04-23-21 Last Dose:   Activity level:   Can go up a flight of stairs and perform activities of daily living without stopping and without symptoms of chest pain or shortness of breath.                                                  Anesthesia review: heart transplant,  HTN, cardiomyopathy, a fib, CVA, DM, OSA, CHF, LBBB, MI, dialysis  Last dialysis 04-21-21, scheduled for 04-26-21   Patient denies shortness of breath, fever, cough and chest pain at PAT appointment     Patient verbalized understanding of instructions that were given to them at the PAT appointment. Patient was also instructed that they will need to review over the PAT instructions again at home before surgery

## 2021-04-26 DIAGNOSIS — D631 Anemia in chronic kidney disease: Secondary | ICD-10-CM | POA: Diagnosis not present

## 2021-04-26 DIAGNOSIS — N186 End stage renal disease: Secondary | ICD-10-CM | POA: Diagnosis not present

## 2021-04-26 DIAGNOSIS — D509 Iron deficiency anemia, unspecified: Secondary | ICD-10-CM | POA: Diagnosis not present

## 2021-04-26 DIAGNOSIS — N2581 Secondary hyperparathyroidism of renal origin: Secondary | ICD-10-CM | POA: Diagnosis not present

## 2021-04-26 DIAGNOSIS — E1129 Type 2 diabetes mellitus with other diabetic kidney complication: Secondary | ICD-10-CM | POA: Diagnosis not present

## 2021-04-26 DIAGNOSIS — Z992 Dependence on renal dialysis: Secondary | ICD-10-CM | POA: Diagnosis not present

## 2021-04-26 LAB — HEMOGLOBIN A1C
Hgb A1c MFr Bld: 5.2 % (ref 4.8–5.6)
Mean Plasma Glucose: 103 mg/dL

## 2021-04-26 NOTE — Anesthesia Preprocedure Evaluation (Addendum)
Anesthesia Evaluation  Patient identified by MRN, date of birth, ID band Patient awake    Reviewed: Allergy & Precautions, NPO status , Patient's Chart, lab work & pertinent test results  History of Anesthesia Complications Negative for: history of anesthetic complications  Airway Mallampati: III  TM Distance: >3 FB Neck ROM: Full    Dental no notable dental hx. (+) Dental Advisory Given   Pulmonary neg pulmonary ROS,    Pulmonary exam normal        Cardiovascular hypertension, +CHF  + dysrhythmias Atrial Fibrillation  Rhythm:Irregular Rate:Tachycardia  S/P Heart transplant  Pt last seen by cardiology 03/30/2021.  New onset A-fib at this time.  He was started on Eliquis and cardioversion planned. Echo 03/30/2021 with normal LV systolic function, no valvular stenosis.   Cardiology has cleared pt for procedure, ok to hold Eliquis 72 hours prior to procedure and 48 hrs after. Pt had upcoming [facility] scheduled, this has been rescheduled by cardiology for 9/8 after urologic procedure.   Echo 03/30/2021 INTERPRETATION ---------------------------------------------------------------  NORMAL LEFT VENTRICULAR SYSTOLIC FUNCTION WITH MILD LVH  MODERATE RV SYSTOLIC DYSFUNCTION (See above)  VALVULAR REGURGITATION: MILD MR, MILD PR, MILD TR  NO VALVULAR STENOSIS  ARRHYTHMIA THROUGHOUT EXAM   Echo 11/09/2018 1. The left ventricle has normal systolic function with an ejection  fraction of 60-65%. The cavity size was normal. There is moderate  concentric left ventricular hypertrophy. Left ventricular diastolic  Doppler parameters are indeterminate. Indeterminate  filling pressures No evidence of left ventricular regional wall motion  abnormalities.  2. The right ventricle has mildly reduced systolic function. The cavity  was mildly enlarged. There is no increase in right ventricular wall  thickness.  3. Left atrial size was severely  dilated.  4. Right atrial size was mildly dilated.  5. The mitral valve is grossly normal.  6. The tricuspid valve is grossly normal.  7. The aortic valve is tricuspid.  8. The aortic root is normal in size and structure.  9. The inferior vena cava was dilated in size with <50% respiratory  variability.    Neuro/Psych CVA    GI/Hepatic Neg liver ROS, GERD  ,  Endo/Other  negative endocrine ROSdiabetes  Renal/GU DialysisRenal disease     Musculoskeletal negative musculoskeletal ROS (+)   Abdominal   Peds  Hematology negative hematology ROS (+)   Anesthesia Other Findings   Reproductive/Obstetrics                           Anesthesia Physical Anesthesia Plan  ASA: 4  Anesthesia Plan: General   Post-op Pain Management:    Induction: Intravenous  PONV Risk Score and Plan: 2 and Ondansetron and Midazolam  Airway Management Planned: LMA  Additional Equipment:   Intra-op Plan:   Post-operative Plan: Extubation in OR  Informed Consent: I have reviewed the patients History and Physical, chart, labs and discussed the procedure including the risks, benefits and alternatives for the proposed anesthesia with the patient or authorized representative who has indicated his/her understanding and acceptance.     Dental advisory given  Plan Discussed with: Anesthesiologist, CRNA and Surgeon  Anesthesia Plan Comments: (See PAT note 04/25/2021, Konrad Felix Ward, PA-C)      Anesthesia Quick Evaluation

## 2021-04-26 NOTE — Progress Notes (Signed)
Anesthesia Chart Review   Case: [id number] Date/Time: 04/27/21 1515   Procedure: CYSTOSCOPY WITH FULGERATION CLOT EVACUATION POSSIBLE TRANSURETHRAL RESECTION BLADDER TUMOR POSSIBLE TRANSURETHRAL RESECTION OF PROSTATE   Anesthesia type: General   Pre-op diagnosis: HUMATURIA WITH CLOTS   Location: [place] / [facility]   Surgeons: [name], MD       DISCUSSION:60 y.o. never smoker with h/o HTN, s/p heart transplant 03/01/2013, LBBB, atrial fibrillation (on Eliquis), DM II (A1C 5.2 on 04/25/2021), ESRD on hemodialysis (last dialysis 04/26/2021), s/p gastric sleeve 2018, hematuria scheduled for above procedure 04/27/21 with Dr. [name].   Pt last seen by cardiology 03/30/2021.  New onset A-fib at this time.  He was started on Eliquis and cardioversion planned. Echo 03/30/2021 with normal LV systolic function, no valvular stenosis.   Cardiology has cleared pt for procedure, ok to hold Eliquis 72 hours prior to procedure and 48 hrs after. Pt had upcoming [facility] scheduled, this has been rescheduled by cardiology for 9/8 after urologic procedure.   Pt has scheduled dialysis day before procedure, labs DOS. Monitor volume status. New onset A-fib, rate controlled at office visit 03/30/21, cardioversion rescheduled for after procedure. Monitor for RVR.   Anticipate pt can proceed with planned procedure barring acute status change and after evaluation by anesthesia DOS.  VS: BP 120/79   Pulse 100   Temp 36.8 C   Resp 20   Ht 6\' 7"  (2.007 m)   Wt (!) 141.1 kg   SpO2 98%   BMI 35.04 kg/m   PROVIDERS: Carol Ada, MD is PCP   Mosetta Pigeon Rhodia Albright, MD is Cardiologist with [facility] LABS: Labs reviewed: Acceptable for surgery. (all labs ordered are listed, but only abnormal results are displayed)  Labs Reviewed  COMPREHENSIVE METABOLIC PANEL - Abnormal; Notable for the following components:      Result Value   Chloride 94 (*)    BUN 51 (*)    Creatinine, Ser 10.80 (*)     Calcium 8.1 (*)    AST 12 (*)    Total Bilirubin 1.4 (*)    GFR, Estimated 5 (*)    Anion gap 18 (*)    All other components within normal limits  HEMOGLOBIN A1C  GLUCOSE, CAPILLARY     IMAGES:   EKG: 03/30/2021 (tracing has been requested) Rate 90bpm  Atrial fibrillation with intermittent aberrant ventricular conduction  Baseline artifact  Left anterior fascicular block  Right bundle branch block  Technically poor tracing  Abnormal ECG   CV: Echo 03/30/2021 INTERPRETATION ---------------------------------------------------------------    NORMAL LEFT VENTRICULAR SYSTOLIC FUNCTION WITH MILD LVH    MODERATE RV SYSTOLIC DYSFUNCTION (See above)    VALVULAR REGURGITATION: MILD MR, MILD PR, MILD TR    NO VALVULAR STENOSIS    ARRHYTHMIA THROUGHOUT EXAM   Echo 11/09/2018  1. The left ventricle has normal systolic function with an ejection  fraction of 60-65%. The cavity size was normal. There is moderate  concentric left ventricular hypertrophy. Left ventricular diastolic  Doppler parameters are indeterminate. Indeterminate   filling pressures No evidence of left ventricular regional wall motion  abnormalities.   2. The right ventricle has mildly reduced systolic function. The cavity  was mildly enlarged. There is no increase in right ventricular wall  thickness.   3. Left atrial size was severely dilated.   4. Right atrial size was mildly dilated.   5. The mitral valve is grossly normal.   6. The tricuspid valve is grossly normal.  7. The aortic valve is tricuspid.   8. The aortic root is normal in size and structure.   9. The inferior vena cava was dilated in size with <50% respiratory  variability.  Past Medical History:  Diagnosis Date   Arthritis    Atrial fibrillation (HCC)    CHF (congestive heart failure), NYHA class III (HCC)    s/p heart transplant   Chronic kidney disease    end stage   Chronic systolic dysfunction of left ventricle    CVA (cerebral  infarction)    Diabetes mellitus    PMH; Prior to heart transplant   Family history of coronary artery disease    in both parents   GERD (gastroesophageal reflux disease)    Hyperlipidemia    Hypertension    Left bundle branch block    Morbid obesity (HCC)    status post lap band   Myocardial infarction [facility])    prior to heart transplant   Nonischemic cardiomyopathy ([place])    prior to heart transplant   Obesity (BMI 30-39.9)    Obstructive sleep apnea    no longer needs CPAP after heart transplant per pt   Premature ventricular contractions    Prostate cancer (HCC)    SOB (shortness of breath)     Past Surgical History:  Procedure Laterality Date   CARDIAC PACEMAKER PLACEMENT  09/21/2009   Biventricular implantable cardioverter-defibrillator implantation      COLONOSCOPY W/ BIOPSIES AND POLYPECTOMY     FOOT SURGERY     left   HEART TRANSPLANT  2014   LAPAROSCOPIC GASTRIC BANDING  01/27/2007   LEFT VENTRICULAR ASSIST DEVICE     implanted at [facility] Right 07/22/2017   Procedure: RIGHT TOTAL HIP ARTHROPLASTY ANTERIOR APPROACH;  Surgeon: Rod Can, MD;  Location: [place];  Service: Orthopedics;  Laterality: Right;  Needs RNFA   TOTAL KNEE ARTHROPLASTY Right 07/22/2017    MEDICATIONS:  amitriptyline (ELAVIL) 25 MG tablet   B Complex-C-Folic Acid (RENO CAPS) 1 MG CAPS   cinacalcet (SENSIPAR) 60 MG tablet   citalopram (CELEXA) 20 MG tablet   cycloSPORINE modified (NEORAL) 100 MG capsule   cycloSPORINE modified (NEORAL) 25 MG capsule   ELIQUIS 2.5 MG TABS tablet   lidocaine (XYLOCAINE) 5 % ointment   lidocaine-prilocaine (EMLA) cream   midodrine (PROAMATINE) 10 MG tablet   mycophenolate (MYFORTIC) 180 MG EC tablet   ondansetron (ZOFRAN) 4 MG tablet   OXcarbazepine (TRILEPTAL) 150 MG tablet   oxybutynin (DITROPAN) 5 MG tablet   oxyCODONE-acetaminophen (PERCOCET/ROXICET) 5-325 MG tablet   pregabalin  (LYRICA) 25 MG capsule   pregabalin (LYRICA) 75 MG capsule   sevelamer carbonate (RENVELA) 800 MG tablet   No current facility-administered medications for this encounter.    [name] Felix Ward, PA-C [facility] Pre-Surgical Testing [phone]

## 2021-04-27 ENCOUNTER — Ambulatory Visit (HOSPITAL_COMMUNITY)
Admission: RE | Admit: 2021-04-27 | Discharge: 2021-04-27 | Disposition: A | Discharge status: Home / Self Care | Payer: Medicare Other | Source: Ambulatory Visit | Attending: Urology | Admitting: Urology

## 2021-04-27 ENCOUNTER — Other Ambulatory Visit: Payer: Self-pay

## 2021-04-27 ENCOUNTER — Ambulatory Visit (HOSPITAL_COMMUNITY): Payer: Medicare Other | Admitting: Physician Assistant

## 2021-04-27 ENCOUNTER — Encounter (HOSPITAL_COMMUNITY)
Admission: RE | Disposition: A | Discharge status: Home / Self Care | Payer: Self-pay | Source: Ambulatory Visit | Attending: Urology

## 2021-04-27 ENCOUNTER — Ambulatory Visit (HOSPITAL_COMMUNITY): Payer: Medicare Other | Admitting: Anesthesiology

## 2021-04-27 ENCOUNTER — Encounter (HOSPITAL_COMMUNITY): Payer: Self-pay | Admitting: Urology

## 2021-04-27 DIAGNOSIS — N3091 Cystitis, unspecified with hematuria: Secondary | ICD-10-CM | POA: Insufficient documentation

## 2021-04-27 DIAGNOSIS — R319 Hematuria, unspecified: Secondary | ICD-10-CM | POA: Diagnosis not present

## 2021-04-27 DIAGNOSIS — I509 Heart failure, unspecified: Secondary | ICD-10-CM | POA: Insufficient documentation

## 2021-04-27 DIAGNOSIS — Z941 Heart transplant status: Secondary | ICD-10-CM | POA: Diagnosis not present

## 2021-04-27 DIAGNOSIS — N3041 Irradiation cystitis with hematuria: Secondary | ICD-10-CM | POA: Diagnosis not present

## 2021-04-27 DIAGNOSIS — Z8546 Personal history of malignant neoplasm of prostate: Secondary | ICD-10-CM | POA: Diagnosis not present

## 2021-04-27 DIAGNOSIS — E785 Hyperlipidemia, unspecified: Secondary | ICD-10-CM | POA: Diagnosis not present

## 2021-04-27 DIAGNOSIS — E1122 Type 2 diabetes mellitus with diabetic chronic kidney disease: Secondary | ICD-10-CM | POA: Diagnosis not present

## 2021-04-27 DIAGNOSIS — D509 Iron deficiency anemia, unspecified: Secondary | ICD-10-CM | POA: Diagnosis not present

## 2021-04-27 DIAGNOSIS — Z7901 Long term (current) use of anticoagulants: Secondary | ICD-10-CM | POA: Insufficient documentation

## 2021-04-27 DIAGNOSIS — Z923 Personal history of irradiation: Secondary | ICD-10-CM | POA: Insufficient documentation

## 2021-04-27 DIAGNOSIS — Z96641 Presence of right artificial hip joint: Secondary | ICD-10-CM | POA: Diagnosis not present

## 2021-04-27 DIAGNOSIS — D631 Anemia in chronic kidney disease: Secondary | ICD-10-CM | POA: Diagnosis not present

## 2021-04-27 DIAGNOSIS — Z880 Allergy status to penicillin: Secondary | ICD-10-CM | POA: Insufficient documentation

## 2021-04-27 DIAGNOSIS — Z96651 Presence of right artificial knee joint: Secondary | ICD-10-CM | POA: Diagnosis not present

## 2021-04-27 DIAGNOSIS — T8620 Unspecified complication of heart transplant: Secondary | ICD-10-CM | POA: Diagnosis not present

## 2021-04-27 DIAGNOSIS — R34 Anuria and oliguria: Secondary | ICD-10-CM | POA: Diagnosis not present

## 2021-04-27 DIAGNOSIS — I132 Hypertensive heart and chronic kidney disease with heart failure and with stage 5 chronic kidney disease, or end stage renal disease: Secondary | ICD-10-CM | POA: Diagnosis not present

## 2021-04-27 DIAGNOSIS — N3289 Other specified disorders of bladder: Secondary | ICD-10-CM | POA: Insufficient documentation

## 2021-04-27 DIAGNOSIS — R310 Gross hematuria: Secondary | ICD-10-CM | POA: Diagnosis present

## 2021-04-27 DIAGNOSIS — Z950 Presence of cardiac pacemaker: Secondary | ICD-10-CM | POA: Diagnosis not present

## 2021-04-27 DIAGNOSIS — Z888 Allergy status to other drugs, medicaments and biological substances status: Secondary | ICD-10-CM | POA: Diagnosis not present

## 2021-04-27 DIAGNOSIS — Z79899 Other long term (current) drug therapy: Secondary | ICD-10-CM | POA: Diagnosis not present

## 2021-04-27 DIAGNOSIS — N2581 Secondary hyperparathyroidism of renal origin: Secondary | ICD-10-CM | POA: Diagnosis not present

## 2021-04-27 DIAGNOSIS — N186 End stage renal disease: Secondary | ICD-10-CM | POA: Diagnosis not present

## 2021-04-27 DIAGNOSIS — G4733 Obstructive sleep apnea (adult) (pediatric): Secondary | ICD-10-CM | POA: Diagnosis not present

## 2021-04-27 DIAGNOSIS — I4891 Unspecified atrial fibrillation: Secondary | ICD-10-CM | POA: Diagnosis not present

## 2021-04-27 DIAGNOSIS — E1129 Type 2 diabetes mellitus with other diabetic kidney complication: Secondary | ICD-10-CM | POA: Diagnosis not present

## 2021-04-27 DIAGNOSIS — Z992 Dependence on renal dialysis: Secondary | ICD-10-CM | POA: Diagnosis not present

## 2021-04-27 HISTORY — PX: CYSTOSCOPY WITH FULGERATION: SHX6638

## 2021-04-27 LAB — POCT I-STAT EG7
Acid-Base Excess: 6 mmol/L — ABNORMAL HIGH (ref 0.0–2.0)
Bicarbonate: 31 mmol/L — ABNORMAL HIGH (ref 20.0–28.0)
Calcium, Ion: 0.97 mmol/L — ABNORMAL LOW (ref 1.15–1.40)
HCT: 31 % — ABNORMAL LOW (ref 39.0–52.0)
Hemoglobin: 10.5 g/dL — ABNORMAL LOW (ref 13.0–17.0)
O2 Saturation: 82 %
Potassium: 4.6 mmol/L (ref 3.5–5.1)
Sodium: 138 mmol/L (ref 135–145)
TCO2: 32 mmol/L (ref 22–32)
pCO2, Ven: 43.5 mmHg — ABNORMAL LOW (ref 44.0–60.0)
pH, Ven: 7.461 — ABNORMAL HIGH (ref 7.250–7.430)
pO2, Ven: 44 mmHg (ref 32.0–45.0)

## 2021-04-27 LAB — CBC
HCT: 32.5 % — ABNORMAL LOW (ref 39.0–52.0)
Hemoglobin: 10.3 g/dL — ABNORMAL LOW (ref 13.0–17.0)
MCH: 30.4 pg (ref 26.0–34.0)
MCHC: 31.7 g/dL (ref 30.0–36.0)
MCV: 95.9 fL (ref 80.0–100.0)
Platelets: 200 10*3/uL (ref 150–400)
RBC: 3.39 MIL/uL — ABNORMAL LOW (ref 4.22–5.81)
RDW: 15.6 % — ABNORMAL HIGH (ref 11.5–15.5)
WBC: 6.8 10*3/uL (ref 4.0–10.5)
nRBC: 0 % (ref 0.0–0.2)

## 2021-04-27 LAB — GLUCOSE, CAPILLARY
Glucose-Capillary: 85 mg/dL (ref 70–99)
Glucose-Capillary: 73 mg/dL (ref 70–99)

## 2021-04-27 LAB — PROTIME-INR
INR: 1.2 (ref 0.8–1.2)
Prothrombin Time: 14.9 seconds (ref 11.4–15.2)

## 2021-04-27 LAB — APTT: aPTT: 36 seconds (ref 24–36)

## 2021-04-27 SURGERY — CYSTOSCOPY, WITH BLADDER FULGURATION
Anesthesia: General | Site: Bladder

## 2021-04-27 MED ORDER — CIPROFLOXACIN IN D5W 400 MG/200ML IV SOLN
400.0000 mg | INTRAVENOUS | Status: AC
  Administered 2021-04-27: 400 mg via INTRAVENOUS
  Filled 2021-04-27: qty 200

## 2021-04-27 MED ORDER — PHENYLEPHRINE HCL-NACL 20-0.9 MG/250ML-% IV SOLN
INTRAVENOUS | Status: DC | PRN
  Administered 2021-04-27: 25 ug/min via INTRAVENOUS

## 2021-04-27 MED ORDER — STERILE WATER FOR IRRIGATION IR SOLN
Status: DC | PRN
  Administered 2021-04-27: 3000 mL

## 2021-04-27 MED ORDER — HYDROMORPHONE HCL 1 MG/ML IJ SOLN
0.2500 mg | INTRAMUSCULAR | Status: DC | PRN
  Administered 2021-04-27: 0.5 mg via INTRAVENOUS

## 2021-04-27 MED ORDER — ORAL CARE MOUTH RINSE
15.0000 mL | Freq: Once | OROMUCOSAL | Status: AC
  Administered 2021-04-27: 15 mL via OROMUCOSAL

## 2021-04-27 MED ORDER — LIDOCAINE HCL URETHRAL/MUCOSAL 2 % EX GEL
CUTANEOUS | Status: DC | PRN
  Administered 2021-04-27: 1

## 2021-04-27 MED ORDER — SODIUM CHLORIDE 0.9 % IR SOLN
Status: DC | PRN
  Administered 2021-04-27: 6000 mL via INTRAVESICAL

## 2021-04-27 MED ORDER — HYDROMORPHONE HCL 1 MG/ML IJ SOLN
INTRAMUSCULAR | Status: AC
  Administered 2021-04-27: 0.5 mg via INTRAVENOUS
  Filled 2021-04-27: qty 1

## 2021-04-27 MED ORDER — LIDOCAINE HCL URETHRAL/MUCOSAL 2 % EX GEL
CUTANEOUS | Status: AC
  Filled 2021-04-27: qty 30

## 2021-04-27 MED ORDER — MIDODRINE HCL 10 MG PO TABS: 10.0000 mg | ORAL_TABLET | Freq: Three times a day (TID) | ORAL | Status: DC

## 2021-04-27 MED ORDER — ACETAMINOPHEN 500 MG PO TABS
1000.0000 mg | ORAL_TABLET | Freq: Once | ORAL | Status: AC
  Administered 2021-04-27: 1000 mg via ORAL
  Filled 2021-04-27: qty 2

## 2021-04-27 MED ORDER — LACTATED RINGERS IV SOLN: INTRAVENOUS | Status: DC

## 2021-04-27 MED ORDER — LIDOCAINE 2% (20 MG/ML) 5 ML SYRINGE
INTRAMUSCULAR | Status: DC | PRN
  Administered 2021-04-27: 100 mg via INTRAVENOUS

## 2021-04-27 MED ORDER — BELLADONNA ALKALOIDS-OPIUM 16.2-60 MG RE SUPP
RECTAL | Status: DC | PRN
  Administered 2021-04-27: 1 via RECTAL

## 2021-04-27 MED ORDER — ELIQUIS 2.5 MG PO TABS: 2.5000 mg | ORAL_TABLET | Freq: Two times a day (BID) | ORAL | Status: AC

## 2021-04-27 MED ORDER — FENTANYL CITRATE (PF) 100 MCG/2ML IJ SOLN
INTRAMUSCULAR | Status: AC
  Filled 2021-04-27: qty 2

## 2021-04-27 MED ORDER — BELLADONNA ALKALOIDS-OPIUM 16.2-30 MG RE SUPP
RECTAL | Status: AC
  Filled 2021-04-27: qty 1

## 2021-04-27 MED ORDER — FENTANYL CITRATE (PF) 100 MCG/2ML IJ SOLN
INTRAMUSCULAR | Status: DC | PRN
  Administered 2021-04-27: 75 ug via INTRAVENOUS
  Administered 2021-04-27: 25 ug via INTRAVENOUS

## 2021-04-27 MED ORDER — CHLORHEXIDINE GLUCONATE 0.12 % MT SOLN: 15.0000 mL | Freq: Once | OROMUCOSAL | Status: AC

## 2021-04-27 MED ORDER — SODIUM CHLORIDE 0.9 % IV SOLN: INTRAVENOUS | Status: DC | PRN

## 2021-04-27 MED ORDER — PROPOFOL 10 MG/ML IV BOLUS
INTRAVENOUS | Status: DC | PRN
  Administered 2021-04-27: 120 mg via INTRAVENOUS

## 2021-04-27 SURGICAL SUPPLY — 20 items
BAG DRN RND TRDRP ANRFLXCHMBR (UROLOGICAL SUPPLIES)
BAG URINE DRAIN 2000ML AR STRL (UROLOGICAL SUPPLIES) IMPLANT
BAG URO CATCHER STRL LF (MISCELLANEOUS) ×2 IMPLANT
DRAPE FOOT SWITCH (DRAPES) ×2 IMPLANT
GLOVE SURG ENC TEXT LTX SZ7.5 (GLOVE) ×2 IMPLANT
GLOVE SURG UNDER POLY LF SZ7 (GLOVE) ×1 IMPLANT
GOWN STRL REUS W/TWL LRG LVL3 (GOWN DISPOSABLE) ×1 IMPLANT
GOWN STRL REUS W/TWL XL LVL3 (GOWN DISPOSABLE) ×2 IMPLANT
IV NS IRRIG 3000ML ARTHROMATIC (IV SOLUTION) ×2 IMPLANT
KIT TURNOVER KIT A (KITS) ×2 IMPLANT
LOOP CUT BIPOLAR 24F LRG (ELECTROSURGICAL) IMPLANT
MANIFOLD NEPTUNE II (INSTRUMENTS) ×2 IMPLANT
MAT PREVALON FULL STRYKER (MISCELLANEOUS) ×1 IMPLANT
PACK CYSTO (CUSTOM PROCEDURE TRAY) ×2 IMPLANT
PENCIL SMOKE EVACUATOR (MISCELLANEOUS) IMPLANT
SYR TOOMEY IRRIG 70ML (MISCELLANEOUS) ×2
SYRINGE TOOMEY IRRIG 70ML (MISCELLANEOUS) IMPLANT
TUBING CONNECTING 10 (TUBING) ×2 IMPLANT
TUBING UROLOGY SET (TUBING) ×2 IMPLANT
WATER STERILE IRR 3000ML UROMA (IV SOLUTION) ×1 IMPLANT

## 2021-04-27 NOTE — Anesthesia Procedure Notes (Signed)
Procedure Name: LMA Insertion Date/Time: 04/27/2021 4:21 PM Performed by: Cynda Familia, CRNA Pre-anesthesia Checklist: Patient identified, Emergency Drugs available, Suction available and Patient being monitored Patient Re-evaluated:Patient Re-evaluated prior to induction Oxygen Delivery Method: Circle System Utilized Preoxygenation: Pre-oxygenation with 100% oxygen Induction Type: IV induction Ventilation: Mask ventilation without difficulty LMA: LMA inserted and LMA with gastric port inserted LMA Size: 4.0 Number of attempts: 1 Placement Confirmation: positive ETCO2 Tube secured with: Tape Dental Injury: Teeth and Oropharynx as per pre-operative assessment  Comments: Smooth IV induction Singer- LMA insertion AM CRNA atraumatic- teeth and mouth as preop bilat BS

## 2021-04-27 NOTE — Op Note (Addendum)
Preoperative diagnosis: Gross hematuria, clot retention Postoperative diagnosis: Gross hematuria, clot retention, hemorrhagic cystitis  Procedure: Cystoscopy with clot evacuation, cystoscopy bladder biopsy fulguration 0.5 cm, fulguration of prostatic urethra  Surgeon: Junious Silk  Anesthesia: General  Indication for procedure: [name] is a 60 year old male with a history of heart transplant, end-stage renal disease.  He is anuric.  He developed gross hematuria with clot passage.  CT revealed a clot in the bladder and follow-up ultrasound in the office revealed retained clot.  He is brought today for above procedure.  Findings: On exam under anesthesia the penis was circumcised by mass or lesion.  Scrotum appeared normal.  On DRE the prostate was about 60 g and smooth without nodule.  A bit indurated from prior radiation.  On cystoscopy the urethra was unremarkable, prostatic urethra showed enlarged lateral lobe, enlarged median lobe and many friable vessels along the prostatic urethra, bladder was diffusely covered with petechiae and telangiectasia static changes from radiation.  I surmise the bladder was empty during his prostate radiation and the bladder received a good dose.  There was no specific mucosal lesion or papillary lesion.  No stone or foreign body in the bladder.  No active bleeding.  Description of procedure: After consent was obtained patient brought to the operating room.  After adequate anesthesia she is placed lithotomy position and prepped and draped in the usual sterile fashion.  Timeout was performed to confirm the patient and procedure.  Cystoscope was passed per urethra the bladder carefully inspected.  Diffuse changes as noted above.  Cold cup biopsy forceps were used to biopsy representative portion of the left bladder posterior.  Sent to pathology.  Fulguration obtained with the loop.  No active bladder bleeding.  Some friable vessels along the prostatic urethra were bleeding  and fulgurated just enough to control for hemostasis.  I was meticulous about hemostasis given that I was not going to leave a catheter.  No bleeding noted.  Bladder drained of a small amount of fluid.  Scope was backed out.  DRE was done and BNO suppository placed.  He is awakened taken the cover room in stable condition.  Complications: None  Blood loss: Minimal  Specimens to pathology: Posterior bladder wall biopsy  Drains: None  Disposition: Patient stable to PACU - I spoke to his wife [name] and discussed the procedure, post-op care and f/u. Also left a lengthy message with same and rec hyperbaric O2 if ok with his transplant team.

## 2021-04-27 NOTE — Transfer of Care (Signed)
Immediate Anesthesia Transfer of Care Note  Patient: [name]  Procedure(s) Performed: CYSTOSCOPY AND CLOT EVACUATION WITH BLADDER BIOPSY/ FUGARATION OF BLADDER/ FULGARATION OF PROSTATE  (Bladder)  Patient Location: PACU  Anesthesia Type:General  Level of Consciousness: sedated  Airway & Oxygen Therapy: Patient Spontanous Breathing and Patient connected to face mask oxygen  Post-op Assessment: Report given to RN and Post -op Vital signs reviewed and stable  Post vital signs: Reviewed and stable  Last Vitals:  Vitals Value Taken Time  BP 135/99 04/27/21 1717  Temp    Pulse 102 04/27/21 1721  Resp 13 04/27/21 1721  SpO2 100 % 04/27/21 1721  Vitals shown include unvalidated device data.  Last Pain:  Vitals:   04/27/21 1326  TempSrc: Oral  PainSc: 0-No pain         Complications: No notable events documented.

## 2021-04-27 NOTE — Anesthesia Procedure Notes (Signed)
Date/Time: 04/27/2021 5:05 PM Performed by: Cynda Familia, CRNA Oxygen Delivery Method: Simple face mask Placement Confirmation: positive ETCO2 and breath sounds checked- equal and bilateral Dental Injury: Teeth and Oropharynx as per pre-operative assessment

## 2021-04-27 NOTE — H&P (Signed)
H&P  Chief Complaint: gross hematuria   History of Present Illness: [name] is a 60 yo with h/o PCa  radiated in 2018. Recent PSA 0.16. Had gross hematuria with clots recently and CT a/p in ED 04/23/2021 revealed likely a large clot in bladder. He's passed some clots but still feels pressure/urgency. He is usually anuric and ESRD so retention hasnt been a problem. [place] in office 04/24/2021 revealed continued clot ball in bladder and he was scheduled for cysto, clot evac, poss TURP and poss TURBT today.   Past Medical History:  Diagnosis Date   Arthritis    Atrial fibrillation (HCC)    CHF (congestive heart failure), NYHA class III (HCC)    s/p heart transplant   Chronic kidney disease    end stage   Chronic systolic dysfunction of left ventricle    CVA (cerebral infarction)    Diabetes mellitus    PMH; Prior to heart transplant   Family history of coronary artery disease    in both parents   GERD (gastroesophageal reflux disease)    Hyperlipidemia    Hypertension    Left bundle branch block    Morbid obesity (HCC)    status post lap band   Myocardial infarction [facility])    prior to heart transplant   Nonischemic cardiomyopathy ([place])    prior to heart transplant   Obesity (BMI 30-39.9)    Obstructive sleep apnea    no longer needs CPAP after heart transplant per pt   Premature ventricular contractions    Prostate cancer (HCC)    SOB (shortness of breath)    Past Surgical History:  Procedure Laterality Date   CARDIAC PACEMAKER PLACEMENT  09/21/2009   Biventricular implantable cardioverter-defibrillator implantation      COLONOSCOPY W/ BIOPSIES AND POLYPECTOMY     FOOT SURGERY     left   HEART TRANSPLANT  2014   LAPAROSCOPIC GASTRIC BANDING  01/27/2007   LEFT VENTRICULAR ASSIST DEVICE     implanted at [place] Right 07/22/2017   Procedure: RIGHT TOTAL HIP ARTHROPLASTY ANTERIOR APPROACH;  Surgeon: Rod Can, MD;  Location: [facility];  Service: Orthopedics;  Laterality: Right;  Needs RNFA   TOTAL KNEE ARTHROPLASTY Right 07/22/2017    Home Medications:  Medications Prior to Admission  Medication Sig Dispense Refill Last Dose   cinacalcet (SENSIPAR) 60 MG tablet Take 60 mg by mouth daily.   04/26/2021   citalopram (CELEXA) 20 MG tablet Take 20 mg by mouth daily.   04/26/2021   cycloSPORINE modified (NEORAL) 100 MG capsule Take 100 mg by mouth See admin instructions. Take with three 25 mg  (75 ) for a total of 175 mg twice a day   04/26/2021   cycloSPORINE modified (NEORAL) 25 MG capsule Take 75 mg by mouth See admin instructions. Take With 100 mg tab for a total of 175 mg twice a day      lidocaine-prilocaine (EMLA) cream Apply 1 application topically as needed.      midodrine (PROAMATINE) 10 MG tablet Take 10 tablets by mouth 3 (three) times daily.   04/26/2021   mycophenolate (MYFORTIC) 180 MG EC tablet Take 720 mg by mouth 2 (two) times daily.   04/26/2021   oxybutynin (DITROPAN) 5 MG tablet Take 1 tablet (5 mg total) by mouth every 8 (eight) hours as needed for bladder spasms. 10 tablet 0 04/26/2021  pregabalin (LYRICA) 75 MG capsule TAKE 1 CAPSULE(75 MG) BY MOUTH DAILY (Patient taking differently: Take 100 mg by mouth daily.) 30 capsule 0    sevelamer carbonate (RENVELA) 800 MG tablet Take 2 tablets (1,600 mg total) by mouth 3 (three) times daily with meals. 120 tablet 0 04/26/2021   amitriptyline (ELAVIL) 25 MG tablet TAKE 1 TABLET(25 MG) BY MOUTH AT BEDTIME (Patient not taking: No sig reported) 30 tablet 1 Not Taking   B Complex-C-Folic Acid (RENO CAPS) 1 MG CAPS Take 1 mg by mouth daily.  3 More than a month   ELIQUIS 2.5 MG TABS tablet Take 2.5 mg by mouth 2 (two) times daily.   04/24/2021   lidocaine (XYLOCAINE) 5 % ointment Apply 1 application topically as needed. (Patient not taking: No sig reported) 35.44 g 0 Not Taking   ondansetron (ZOFRAN) 4 MG tablet Take 1 tablet (4 mg total) by mouth  every 6 (six) hours as needed for nausea. (Patient not taking: Reported on 04/24/2021) 20 tablet 0 Not Taking   OXcarbazepine (TRILEPTAL) 150 MG tablet Take 1 tablet (150 mg total) by mouth 2 (two) times daily. (Patient not taking: No sig reported) 60 tablet 11 Not Taking   oxyCODONE-acetaminophen (PERCOCET/ROXICET) 5-325 MG tablet Take 1 tablet by mouth every 8 (eight) hours as needed for severe pain. 6 tablet 0 More than a month   pregabalin (LYRICA) 25 MG capsule TAKE 5 CAPSULES BY MOUTH AFTER DIALYSIS (Patient not taking: No sig reported) 60 capsule 1 Not Taking   Allergies:  Allergies  Allergen Reactions   Heparin Nausea Only, Swelling and Other (See Comments)     * * HIT * * SWELLING REACTION UNSPECIFIED  DIAPHORESIS   Penicillin G Potassium [Penicillin G] Nausea Only and Other (See Comments)    DIAPHORESIS    Family History  Problem Relation Age of Onset   Coronary artery disease Father        had, PTCA & CABG   Heart attack Father    Hypertension Father    Diabetes Father    Prostate cancer Father    Coronary artery disease Mother    Heart attack Mother    Hypertension Mother    Stroke Mother    Lupus Mother    Prostate cancer Brother    Coronary artery disease Brother    Coronary artery disease Sister    Heart attack Sister    Prostate cancer Paternal Uncle    Coronary artery disease Maternal Grandmother    Coronary artery disease Maternal Grandfather    Coronary artery disease Paternal Grandmother    Coronary artery disease Paternal Grandfather    Social History:  reports that he has never smoked. He has quit using smokeless tobacco.  His smokeless tobacco use included snuff. He reports that he does not drink alcohol and does not use drugs.  ROS: A complete review of systems was performed.  All systems are negative except for pertinent findings as noted. Review of Systems  All other systems reviewed and are negative.   Physical Exam:  Vital signs in last 24  hours: Temp:  [98.2 F (36.8 C)] 98.2 F (36.8 C) (09/01 1326) Pulse Rate:  [113] 113 (09/01 1326) Resp:  [16] 16 (09/01 1326) BP: (112)/(71) 112/71 (09/01 1326) SpO2:  [97 %] 97 % (09/01 1326) Weight:  [142.4 kg] 142.4 kg (09/01 1326) General:  Alert and oriented, No acute distress HEENT: Normocephalic, atraumatic Cardiovascular: Regular rate and rhythm Lungs: Regular rate and effort Abdomen:  Soft, nontender, nondistended, no abdominal masses Extremities: No edema Neurologic: Grossly intact  Laboratory Data:  Results for orders placed or performed during the hospital encounter of 04/27/21 (from the past 24 hour(s))  Glucose, capillary     Status: None   Collection Time: 04/27/21  1:27 PM  Result Value Ref Range   Glucose-Capillary 85 70 - 99 mg/dL   Comment 1 Notify RN   CBC per protocol     Status: Abnormal   Collection Time: 04/27/21  1:35 PM  Result Value Ref Range   WBC 6.8 4.0 - 10.5 K/uL   RBC 3.39 (L) 4.22 - 5.81 MIL/uL   Hemoglobin 10.3 (L) 13.0 - 17.0 g/dL   HCT 32.5 (L) 39.0 - 52.0 %   MCV 95.9 80.0 - 100.0 fL   MCH 30.4 26.0 - 34.0 pg   MCHC 31.7 30.0 - 36.0 g/dL   RDW 15.6 (H) 11.5 - 15.5 %   Platelets 200 150 - 400 K/uL   nRBC 0.0 0.0 - 0.2 %  PTT Day of Surgery per protocol     Status: None   Collection Time: 04/27/21  1:35 PM  Result Value Ref Range   aPTT 36 24 - 36 seconds  PT- INR Day of Surgery per protocol     Status: None   Collection Time: 04/27/21  1:35 PM  Result Value Ref Range   Prothrombin Time 14.9 11.4 - 15.2 seconds   INR 1.2 0.8 - 1.2  POCT I-Stat EG7     Status: Abnormal   Collection Time: 04/27/21  3:25 PM  Result Value Ref Range   pH, Ven 7.461 (H) 7.250 - 7.430   pCO2, Ven 43.5 (L) 44.0 - 60.0 mmHg   pO2, Ven 44.0 32.0 - 45.0 mmHg   Bicarbonate 31.0 (H) 20.0 - 28.0 mmol/L   TCO2 32 22 - 32 mmol/L   O2 Saturation 82.0 %   Acid-Base Excess 6.0 (H) 0.0 - 2.0 mmol/L   Sodium 138 135 - 145 mmol/L   Potassium 4.6 3.5 - 5.1 mmol/L    Calcium, Ion 0.97 (L) 1.15 - 1.40 mmol/L   HCT 31.0 (L) 39.0 - 52.0 %   Hemoglobin 10.5 (L) 13.0 - 17.0 g/dL   Sample type VENOUS    No results found for this or any previous visit (from the past 240 hour(s)). Creatinine: Recent Labs    04/23/21 1317 04/25/21 0825  CREATININE 7.58* 10.80*    Impression/Assessment/plan:  Gross hematuria, clot retention - I discussed with the patient the nature, potential benefits, risks and alternatives to cysto, clot evac, poss TURP and poss TURBT today, including side effects of the proposed treatment, the likelihood of the patient achieving the goals of the procedure, and any potential problems that might occur during the procedure or recuperation. All questions answered. Patient elects to proceed.    [name] 04/27/2021, 4:08 PM

## 2021-04-27 NOTE — Anesthesia Postprocedure Evaluation (Signed)
Anesthesia Post Note  Patient: [name]  Procedure(s) Performed: CYSTOSCOPY AND CLOT EVACUATION WITH BLADDER BIOPSY/ FUGARATION OF BLADDER/ FULGARATION OF PROSTATE  (Bladder)     Patient location during evaluation: PACU Anesthesia Type: General Level of consciousness: sedated Pain management: pain level controlled Vital Signs Assessment: post-procedure vital signs reviewed and stable Respiratory status: spontaneous breathing and respiratory function stable Cardiovascular status: stable Postop Assessment: no apparent nausea or vomiting Anesthetic complications: no   No notable events documented.  Last Vitals:  Vitals:   04/27/21 1800 04/27/21 1815  BP: (!) 131/103 117/87  Pulse: (!) 107 (!) 105  Resp: (!) 21 12  Temp: 36.6 C 36.6 C  SpO2: 97% 94%    Last Pain:  Vitals:   04/27/21 1815  TempSrc:   PainSc: 5                  Jarris Kortz DANIEL

## 2021-04-28 ENCOUNTER — Encounter (HOSPITAL_COMMUNITY): Payer: Self-pay | Admitting: Urology

## 2021-04-28 DIAGNOSIS — N186 End stage renal disease: Secondary | ICD-10-CM | POA: Diagnosis not present

## 2021-04-28 DIAGNOSIS — D631 Anemia in chronic kidney disease: Secondary | ICD-10-CM | POA: Diagnosis not present

## 2021-04-28 DIAGNOSIS — Z992 Dependence on renal dialysis: Secondary | ICD-10-CM | POA: Diagnosis not present

## 2021-04-28 DIAGNOSIS — E1129 Type 2 diabetes mellitus with other diabetic kidney complication: Secondary | ICD-10-CM | POA: Diagnosis not present

## 2021-04-28 DIAGNOSIS — N2581 Secondary hyperparathyroidism of renal origin: Secondary | ICD-10-CM | POA: Diagnosis not present

## 2021-04-28 DIAGNOSIS — D509 Iron deficiency anemia, unspecified: Secondary | ICD-10-CM | POA: Diagnosis not present

## 2021-05-01 DIAGNOSIS — D509 Iron deficiency anemia, unspecified: Secondary | ICD-10-CM | POA: Diagnosis not present

## 2021-05-01 DIAGNOSIS — N2581 Secondary hyperparathyroidism of renal origin: Secondary | ICD-10-CM | POA: Diagnosis not present

## 2021-05-01 DIAGNOSIS — E1129 Type 2 diabetes mellitus with other diabetic kidney complication: Secondary | ICD-10-CM | POA: Diagnosis not present

## 2021-05-01 DIAGNOSIS — N186 End stage renal disease: Secondary | ICD-10-CM | POA: Diagnosis not present

## 2021-05-01 DIAGNOSIS — Z992 Dependence on renal dialysis: Secondary | ICD-10-CM | POA: Diagnosis not present

## 2021-05-01 DIAGNOSIS — D631 Anemia in chronic kidney disease: Secondary | ICD-10-CM | POA: Diagnosis not present

## 2021-05-02 LAB — SURGICAL PATHOLOGY

## 2021-05-03 DIAGNOSIS — N186 End stage renal disease: Secondary | ICD-10-CM | POA: Diagnosis not present

## 2021-05-03 DIAGNOSIS — D509 Iron deficiency anemia, unspecified: Secondary | ICD-10-CM | POA: Diagnosis not present

## 2021-05-03 DIAGNOSIS — Z992 Dependence on renal dialysis: Secondary | ICD-10-CM | POA: Diagnosis not present

## 2021-05-03 DIAGNOSIS — D631 Anemia in chronic kidney disease: Secondary | ICD-10-CM | POA: Diagnosis not present

## 2021-05-03 DIAGNOSIS — N2581 Secondary hyperparathyroidism of renal origin: Secondary | ICD-10-CM | POA: Diagnosis not present

## 2021-05-03 DIAGNOSIS — E1129 Type 2 diabetes mellitus with other diabetic kidney complication: Secondary | ICD-10-CM | POA: Diagnosis not present

## 2021-05-05 DIAGNOSIS — D631 Anemia in chronic kidney disease: Secondary | ICD-10-CM | POA: Diagnosis not present

## 2021-05-05 DIAGNOSIS — Z992 Dependence on renal dialysis: Secondary | ICD-10-CM | POA: Diagnosis not present

## 2021-05-05 DIAGNOSIS — N2581 Secondary hyperparathyroidism of renal origin: Secondary | ICD-10-CM | POA: Diagnosis not present

## 2021-05-05 DIAGNOSIS — D509 Iron deficiency anemia, unspecified: Secondary | ICD-10-CM | POA: Diagnosis not present

## 2021-05-05 DIAGNOSIS — E1129 Type 2 diabetes mellitus with other diabetic kidney complication: Secondary | ICD-10-CM | POA: Diagnosis not present

## 2021-05-05 DIAGNOSIS — N186 End stage renal disease: Secondary | ICD-10-CM | POA: Diagnosis not present

## 2021-05-08 DIAGNOSIS — Z992 Dependence on renal dialysis: Secondary | ICD-10-CM | POA: Diagnosis not present

## 2021-05-08 DIAGNOSIS — E1129 Type 2 diabetes mellitus with other diabetic kidney complication: Secondary | ICD-10-CM | POA: Diagnosis not present

## 2021-05-08 DIAGNOSIS — D631 Anemia in chronic kidney disease: Secondary | ICD-10-CM | POA: Diagnosis not present

## 2021-05-08 DIAGNOSIS — N186 End stage renal disease: Secondary | ICD-10-CM | POA: Diagnosis not present

## 2021-05-08 DIAGNOSIS — N2581 Secondary hyperparathyroidism of renal origin: Secondary | ICD-10-CM | POA: Diagnosis not present

## 2021-05-08 DIAGNOSIS — D509 Iron deficiency anemia, unspecified: Secondary | ICD-10-CM | POA: Diagnosis not present

## 2021-05-09 DIAGNOSIS — R310 Gross hematuria: Secondary | ICD-10-CM | POA: Diagnosis not present

## 2021-05-09 DIAGNOSIS — C61 Malignant neoplasm of prostate: Secondary | ICD-10-CM | POA: Diagnosis not present

## 2021-05-10 DIAGNOSIS — D631 Anemia in chronic kidney disease: Secondary | ICD-10-CM | POA: Diagnosis not present

## 2021-05-10 DIAGNOSIS — Z992 Dependence on renal dialysis: Secondary | ICD-10-CM | POA: Diagnosis not present

## 2021-05-10 DIAGNOSIS — E1129 Type 2 diabetes mellitus with other diabetic kidney complication: Secondary | ICD-10-CM | POA: Diagnosis not present

## 2021-05-10 DIAGNOSIS — N2581 Secondary hyperparathyroidism of renal origin: Secondary | ICD-10-CM | POA: Diagnosis not present

## 2021-05-10 DIAGNOSIS — N186 End stage renal disease: Secondary | ICD-10-CM | POA: Diagnosis not present

## 2021-05-10 DIAGNOSIS — D509 Iron deficiency anemia, unspecified: Secondary | ICD-10-CM | POA: Diagnosis not present

## 2021-05-12 DIAGNOSIS — N186 End stage renal disease: Secondary | ICD-10-CM | POA: Diagnosis not present

## 2021-05-12 DIAGNOSIS — Z992 Dependence on renal dialysis: Secondary | ICD-10-CM | POA: Diagnosis not present

## 2021-05-15 DIAGNOSIS — N186 End stage renal disease: Secondary | ICD-10-CM | POA: Diagnosis not present

## 2021-05-15 DIAGNOSIS — Z992 Dependence on renal dialysis: Secondary | ICD-10-CM | POA: Diagnosis not present

## 2021-05-16 ENCOUNTER — Institutional Professional Consult (permissible substitution): Payer: Medicare Other | Admitting: Neurology

## 2021-05-17 DIAGNOSIS — N186 End stage renal disease: Secondary | ICD-10-CM | POA: Diagnosis not present

## 2021-05-17 DIAGNOSIS — Z992 Dependence on renal dialysis: Secondary | ICD-10-CM | POA: Diagnosis not present

## 2021-05-19 DIAGNOSIS — Z992 Dependence on renal dialysis: Secondary | ICD-10-CM | POA: Diagnosis not present

## 2021-05-19 DIAGNOSIS — N186 End stage renal disease: Secondary | ICD-10-CM | POA: Diagnosis not present

## 2021-05-22 DIAGNOSIS — N186 End stage renal disease: Secondary | ICD-10-CM | POA: Diagnosis not present

## 2021-05-22 DIAGNOSIS — D631 Anemia in chronic kidney disease: Secondary | ICD-10-CM | POA: Diagnosis not present

## 2021-05-22 DIAGNOSIS — Z992 Dependence on renal dialysis: Secondary | ICD-10-CM | POA: Diagnosis not present

## 2021-05-22 DIAGNOSIS — D509 Iron deficiency anemia, unspecified: Secondary | ICD-10-CM | POA: Diagnosis not present

## 2021-05-22 DIAGNOSIS — N2581 Secondary hyperparathyroidism of renal origin: Secondary | ICD-10-CM | POA: Diagnosis not present

## 2021-05-22 DIAGNOSIS — E1129 Type 2 diabetes mellitus with other diabetic kidney complication: Secondary | ICD-10-CM | POA: Diagnosis not present

## 2021-05-24 DIAGNOSIS — E1129 Type 2 diabetes mellitus with other diabetic kidney complication: Secondary | ICD-10-CM | POA: Diagnosis not present

## 2021-05-24 DIAGNOSIS — D509 Iron deficiency anemia, unspecified: Secondary | ICD-10-CM | POA: Diagnosis not present

## 2021-05-24 DIAGNOSIS — D631 Anemia in chronic kidney disease: Secondary | ICD-10-CM | POA: Diagnosis not present

## 2021-05-24 DIAGNOSIS — Z992 Dependence on renal dialysis: Secondary | ICD-10-CM | POA: Diagnosis not present

## 2021-05-24 DIAGNOSIS — N186 End stage renal disease: Secondary | ICD-10-CM | POA: Diagnosis not present

## 2021-05-24 DIAGNOSIS — N2581 Secondary hyperparathyroidism of renal origin: Secondary | ICD-10-CM | POA: Diagnosis not present

## 2021-05-26 DIAGNOSIS — D631 Anemia in chronic kidney disease: Secondary | ICD-10-CM | POA: Diagnosis not present

## 2021-05-26 DIAGNOSIS — E1129 Type 2 diabetes mellitus with other diabetic kidney complication: Secondary | ICD-10-CM | POA: Diagnosis not present

## 2021-05-26 DIAGNOSIS — N2581 Secondary hyperparathyroidism of renal origin: Secondary | ICD-10-CM | POA: Diagnosis not present

## 2021-05-26 DIAGNOSIS — Z992 Dependence on renal dialysis: Secondary | ICD-10-CM | POA: Diagnosis not present

## 2021-05-26 DIAGNOSIS — D509 Iron deficiency anemia, unspecified: Secondary | ICD-10-CM | POA: Diagnosis not present

## 2021-05-26 DIAGNOSIS — N186 End stage renal disease: Secondary | ICD-10-CM | POA: Diagnosis not present

## 2021-05-27 DIAGNOSIS — Z992 Dependence on renal dialysis: Secondary | ICD-10-CM | POA: Diagnosis not present

## 2021-05-27 DIAGNOSIS — N186 End stage renal disease: Secondary | ICD-10-CM | POA: Diagnosis not present

## 2021-05-27 DIAGNOSIS — T8620 Unspecified complication of heart transplant: Secondary | ICD-10-CM | POA: Diagnosis not present

## 2021-05-29 DIAGNOSIS — D509 Iron deficiency anemia, unspecified: Secondary | ICD-10-CM | POA: Diagnosis not present

## 2021-05-29 DIAGNOSIS — N186 End stage renal disease: Secondary | ICD-10-CM | POA: Diagnosis not present

## 2021-05-29 DIAGNOSIS — E1129 Type 2 diabetes mellitus with other diabetic kidney complication: Secondary | ICD-10-CM | POA: Diagnosis not present

## 2021-05-29 DIAGNOSIS — D631 Anemia in chronic kidney disease: Secondary | ICD-10-CM | POA: Diagnosis not present

## 2021-05-29 DIAGNOSIS — N2581 Secondary hyperparathyroidism of renal origin: Secondary | ICD-10-CM | POA: Diagnosis not present

## 2021-05-29 DIAGNOSIS — Z992 Dependence on renal dialysis: Secondary | ICD-10-CM | POA: Diagnosis not present

## 2021-05-30 DIAGNOSIS — I4891 Unspecified atrial fibrillation: Secondary | ICD-10-CM | POA: Diagnosis not present

## 2021-05-30 DIAGNOSIS — Z48298 Encounter for aftercare following other organ transplant: Secondary | ICD-10-CM | POA: Diagnosis not present

## 2021-05-30 DIAGNOSIS — D849 Immunodeficiency, unspecified: Secondary | ICD-10-CM | POA: Diagnosis not present

## 2021-05-30 DIAGNOSIS — Z941 Heart transplant status: Secondary | ICD-10-CM | POA: Diagnosis not present

## 2021-05-31 DIAGNOSIS — Z992 Dependence on renal dialysis: Secondary | ICD-10-CM | POA: Diagnosis not present

## 2021-05-31 DIAGNOSIS — E1129 Type 2 diabetes mellitus with other diabetic kidney complication: Secondary | ICD-10-CM | POA: Diagnosis not present

## 2021-05-31 DIAGNOSIS — N186 End stage renal disease: Secondary | ICD-10-CM | POA: Diagnosis not present

## 2021-05-31 DIAGNOSIS — D631 Anemia in chronic kidney disease: Secondary | ICD-10-CM | POA: Diagnosis not present

## 2021-05-31 DIAGNOSIS — D509 Iron deficiency anemia, unspecified: Secondary | ICD-10-CM | POA: Diagnosis not present

## 2021-05-31 DIAGNOSIS — N2581 Secondary hyperparathyroidism of renal origin: Secondary | ICD-10-CM | POA: Diagnosis not present

## 2021-06-02 DIAGNOSIS — N2581 Secondary hyperparathyroidism of renal origin: Secondary | ICD-10-CM | POA: Diagnosis not present

## 2021-06-02 DIAGNOSIS — E1129 Type 2 diabetes mellitus with other diabetic kidney complication: Secondary | ICD-10-CM | POA: Diagnosis not present

## 2021-06-02 DIAGNOSIS — Z992 Dependence on renal dialysis: Secondary | ICD-10-CM | POA: Diagnosis not present

## 2021-06-02 DIAGNOSIS — D509 Iron deficiency anemia, unspecified: Secondary | ICD-10-CM | POA: Diagnosis not present

## 2021-06-02 DIAGNOSIS — N186 End stage renal disease: Secondary | ICD-10-CM | POA: Diagnosis not present

## 2021-06-02 DIAGNOSIS — D631 Anemia in chronic kidney disease: Secondary | ICD-10-CM | POA: Diagnosis not present

## 2021-06-05 DIAGNOSIS — D631 Anemia in chronic kidney disease: Secondary | ICD-10-CM | POA: Diagnosis not present

## 2021-06-05 DIAGNOSIS — Z992 Dependence on renal dialysis: Secondary | ICD-10-CM | POA: Diagnosis not present

## 2021-06-05 DIAGNOSIS — N186 End stage renal disease: Secondary | ICD-10-CM | POA: Diagnosis not present

## 2021-06-05 DIAGNOSIS — E1129 Type 2 diabetes mellitus with other diabetic kidney complication: Secondary | ICD-10-CM | POA: Diagnosis not present

## 2021-06-05 DIAGNOSIS — N2581 Secondary hyperparathyroidism of renal origin: Secondary | ICD-10-CM | POA: Diagnosis not present

## 2021-06-05 DIAGNOSIS — D509 Iron deficiency anemia, unspecified: Secondary | ICD-10-CM | POA: Diagnosis not present

## 2021-06-07 DIAGNOSIS — D509 Iron deficiency anemia, unspecified: Secondary | ICD-10-CM | POA: Diagnosis not present

## 2021-06-07 DIAGNOSIS — N2581 Secondary hyperparathyroidism of renal origin: Secondary | ICD-10-CM | POA: Diagnosis not present

## 2021-06-07 DIAGNOSIS — E1129 Type 2 diabetes mellitus with other diabetic kidney complication: Secondary | ICD-10-CM | POA: Diagnosis not present

## 2021-06-07 DIAGNOSIS — N186 End stage renal disease: Secondary | ICD-10-CM | POA: Diagnosis not present

## 2021-06-07 DIAGNOSIS — Z992 Dependence on renal dialysis: Secondary | ICD-10-CM | POA: Diagnosis not present

## 2021-06-07 DIAGNOSIS — D631 Anemia in chronic kidney disease: Secondary | ICD-10-CM | POA: Diagnosis not present

## 2021-06-09 DIAGNOSIS — N2581 Secondary hyperparathyroidism of renal origin: Secondary | ICD-10-CM | POA: Diagnosis not present

## 2021-06-09 DIAGNOSIS — Z992 Dependence on renal dialysis: Secondary | ICD-10-CM | POA: Diagnosis not present

## 2021-06-09 DIAGNOSIS — N186 End stage renal disease: Secondary | ICD-10-CM | POA: Diagnosis not present

## 2021-06-09 DIAGNOSIS — D631 Anemia in chronic kidney disease: Secondary | ICD-10-CM | POA: Diagnosis not present

## 2021-06-09 DIAGNOSIS — D509 Iron deficiency anemia, unspecified: Secondary | ICD-10-CM | POA: Diagnosis not present

## 2021-06-09 DIAGNOSIS — E1129 Type 2 diabetes mellitus with other diabetic kidney complication: Secondary | ICD-10-CM | POA: Diagnosis not present

## 2021-06-12 DIAGNOSIS — N2581 Secondary hyperparathyroidism of renal origin: Secondary | ICD-10-CM | POA: Diagnosis not present

## 2021-06-12 DIAGNOSIS — D509 Iron deficiency anemia, unspecified: Secondary | ICD-10-CM | POA: Diagnosis not present

## 2021-06-12 DIAGNOSIS — E1129 Type 2 diabetes mellitus with other diabetic kidney complication: Secondary | ICD-10-CM | POA: Diagnosis not present

## 2021-06-12 DIAGNOSIS — Z992 Dependence on renal dialysis: Secondary | ICD-10-CM | POA: Diagnosis not present

## 2021-06-12 DIAGNOSIS — D631 Anemia in chronic kidney disease: Secondary | ICD-10-CM | POA: Diagnosis not present

## 2021-06-12 DIAGNOSIS — N186 End stage renal disease: Secondary | ICD-10-CM | POA: Diagnosis not present

## 2021-06-14 DIAGNOSIS — D509 Iron deficiency anemia, unspecified: Secondary | ICD-10-CM | POA: Diagnosis not present

## 2021-06-14 DIAGNOSIS — N2581 Secondary hyperparathyroidism of renal origin: Secondary | ICD-10-CM | POA: Diagnosis not present

## 2021-06-14 DIAGNOSIS — D631 Anemia in chronic kidney disease: Secondary | ICD-10-CM | POA: Diagnosis not present

## 2021-06-14 DIAGNOSIS — Z992 Dependence on renal dialysis: Secondary | ICD-10-CM | POA: Diagnosis not present

## 2021-06-14 DIAGNOSIS — N186 End stage renal disease: Secondary | ICD-10-CM | POA: Diagnosis not present

## 2021-06-14 DIAGNOSIS — E1129 Type 2 diabetes mellitus with other diabetic kidney complication: Secondary | ICD-10-CM | POA: Diagnosis not present

## 2021-06-16 DIAGNOSIS — N2581 Secondary hyperparathyroidism of renal origin: Secondary | ICD-10-CM | POA: Diagnosis not present

## 2021-06-16 DIAGNOSIS — D631 Anemia in chronic kidney disease: Secondary | ICD-10-CM | POA: Diagnosis not present

## 2021-06-16 DIAGNOSIS — Z992 Dependence on renal dialysis: Secondary | ICD-10-CM | POA: Diagnosis not present

## 2021-06-16 DIAGNOSIS — N186 End stage renal disease: Secondary | ICD-10-CM | POA: Diagnosis not present

## 2021-06-16 DIAGNOSIS — D509 Iron deficiency anemia, unspecified: Secondary | ICD-10-CM | POA: Diagnosis not present

## 2021-06-16 DIAGNOSIS — E1129 Type 2 diabetes mellitus with other diabetic kidney complication: Secondary | ICD-10-CM | POA: Diagnosis not present

## 2021-06-19 DIAGNOSIS — E1129 Type 2 diabetes mellitus with other diabetic kidney complication: Secondary | ICD-10-CM | POA: Diagnosis not present

## 2021-06-19 DIAGNOSIS — D631 Anemia in chronic kidney disease: Secondary | ICD-10-CM | POA: Diagnosis not present

## 2021-06-19 DIAGNOSIS — Z992 Dependence on renal dialysis: Secondary | ICD-10-CM | POA: Diagnosis not present

## 2021-06-19 DIAGNOSIS — D509 Iron deficiency anemia, unspecified: Secondary | ICD-10-CM | POA: Diagnosis not present

## 2021-06-19 DIAGNOSIS — N2581 Secondary hyperparathyroidism of renal origin: Secondary | ICD-10-CM | POA: Diagnosis not present

## 2021-06-19 DIAGNOSIS — N186 End stage renal disease: Secondary | ICD-10-CM | POA: Diagnosis not present

## 2021-06-21 DIAGNOSIS — N186 End stage renal disease: Secondary | ICD-10-CM | POA: Diagnosis not present

## 2021-06-21 DIAGNOSIS — D631 Anemia in chronic kidney disease: Secondary | ICD-10-CM | POA: Diagnosis not present

## 2021-06-21 DIAGNOSIS — D509 Iron deficiency anemia, unspecified: Secondary | ICD-10-CM | POA: Diagnosis not present

## 2021-06-21 DIAGNOSIS — Z992 Dependence on renal dialysis: Secondary | ICD-10-CM | POA: Diagnosis not present

## 2021-06-21 DIAGNOSIS — E1129 Type 2 diabetes mellitus with other diabetic kidney complication: Secondary | ICD-10-CM | POA: Diagnosis not present

## 2021-06-21 DIAGNOSIS — N2581 Secondary hyperparathyroidism of renal origin: Secondary | ICD-10-CM | POA: Diagnosis not present

## 2021-06-23 DIAGNOSIS — N2581 Secondary hyperparathyroidism of renal origin: Secondary | ICD-10-CM | POA: Diagnosis not present

## 2021-06-23 DIAGNOSIS — D631 Anemia in chronic kidney disease: Secondary | ICD-10-CM | POA: Diagnosis not present

## 2021-06-23 DIAGNOSIS — E1129 Type 2 diabetes mellitus with other diabetic kidney complication: Secondary | ICD-10-CM | POA: Diagnosis not present

## 2021-06-23 DIAGNOSIS — Z992 Dependence on renal dialysis: Secondary | ICD-10-CM | POA: Diagnosis not present

## 2021-06-23 DIAGNOSIS — D509 Iron deficiency anemia, unspecified: Secondary | ICD-10-CM | POA: Diagnosis not present

## 2021-06-23 DIAGNOSIS — N186 End stage renal disease: Secondary | ICD-10-CM | POA: Diagnosis not present

## 2021-06-26 DIAGNOSIS — E1129 Type 2 diabetes mellitus with other diabetic kidney complication: Secondary | ICD-10-CM | POA: Diagnosis not present

## 2021-06-26 DIAGNOSIS — N2581 Secondary hyperparathyroidism of renal origin: Secondary | ICD-10-CM | POA: Diagnosis not present

## 2021-06-26 DIAGNOSIS — N186 End stage renal disease: Secondary | ICD-10-CM | POA: Diagnosis not present

## 2021-06-26 DIAGNOSIS — D631 Anemia in chronic kidney disease: Secondary | ICD-10-CM | POA: Diagnosis not present

## 2021-06-26 DIAGNOSIS — Z992 Dependence on renal dialysis: Secondary | ICD-10-CM | POA: Diagnosis not present

## 2021-06-26 DIAGNOSIS — D509 Iron deficiency anemia, unspecified: Secondary | ICD-10-CM | POA: Diagnosis not present

## 2021-06-27 DIAGNOSIS — T8620 Unspecified complication of heart transplant: Secondary | ICD-10-CM | POA: Diagnosis not present

## 2021-06-27 DIAGNOSIS — N186 End stage renal disease: Secondary | ICD-10-CM | POA: Diagnosis not present

## 2021-06-27 DIAGNOSIS — Z992 Dependence on renal dialysis: Secondary | ICD-10-CM | POA: Diagnosis not present

## 2021-06-28 DIAGNOSIS — N186 End stage renal disease: Secondary | ICD-10-CM | POA: Diagnosis not present

## 2021-06-28 DIAGNOSIS — N2581 Secondary hyperparathyroidism of renal origin: Secondary | ICD-10-CM | POA: Diagnosis not present

## 2021-06-28 DIAGNOSIS — E1129 Type 2 diabetes mellitus with other diabetic kidney complication: Secondary | ICD-10-CM | POA: Diagnosis not present

## 2021-06-28 DIAGNOSIS — D509 Iron deficiency anemia, unspecified: Secondary | ICD-10-CM | POA: Diagnosis not present

## 2021-06-28 DIAGNOSIS — Z992 Dependence on renal dialysis: Secondary | ICD-10-CM | POA: Diagnosis not present

## 2021-06-28 DIAGNOSIS — D631 Anemia in chronic kidney disease: Secondary | ICD-10-CM | POA: Diagnosis not present

## 2021-06-30 DIAGNOSIS — E1129 Type 2 diabetes mellitus with other diabetic kidney complication: Secondary | ICD-10-CM | POA: Diagnosis not present

## 2021-06-30 DIAGNOSIS — D509 Iron deficiency anemia, unspecified: Secondary | ICD-10-CM | POA: Diagnosis not present

## 2021-06-30 DIAGNOSIS — Z992 Dependence on renal dialysis: Secondary | ICD-10-CM | POA: Diagnosis not present

## 2021-06-30 DIAGNOSIS — N2581 Secondary hyperparathyroidism of renal origin: Secondary | ICD-10-CM | POA: Diagnosis not present

## 2021-06-30 DIAGNOSIS — N186 End stage renal disease: Secondary | ICD-10-CM | POA: Diagnosis not present

## 2021-06-30 DIAGNOSIS — D631 Anemia in chronic kidney disease: Secondary | ICD-10-CM | POA: Diagnosis not present

## 2021-07-03 DIAGNOSIS — D631 Anemia in chronic kidney disease: Secondary | ICD-10-CM | POA: Diagnosis not present

## 2021-07-03 DIAGNOSIS — N186 End stage renal disease: Secondary | ICD-10-CM | POA: Diagnosis not present

## 2021-07-03 DIAGNOSIS — Z992 Dependence on renal dialysis: Secondary | ICD-10-CM | POA: Diagnosis not present

## 2021-07-03 DIAGNOSIS — D509 Iron deficiency anemia, unspecified: Secondary | ICD-10-CM | POA: Diagnosis not present

## 2021-07-03 DIAGNOSIS — E1129 Type 2 diabetes mellitus with other diabetic kidney complication: Secondary | ICD-10-CM | POA: Diagnosis not present

## 2021-07-03 DIAGNOSIS — N2581 Secondary hyperparathyroidism of renal origin: Secondary | ICD-10-CM | POA: Diagnosis not present

## 2021-07-05 DIAGNOSIS — D509 Iron deficiency anemia, unspecified: Secondary | ICD-10-CM | POA: Diagnosis not present

## 2021-07-05 DIAGNOSIS — N186 End stage renal disease: Secondary | ICD-10-CM | POA: Diagnosis not present

## 2021-07-05 DIAGNOSIS — N2581 Secondary hyperparathyroidism of renal origin: Secondary | ICD-10-CM | POA: Diagnosis not present

## 2021-07-05 DIAGNOSIS — E1129 Type 2 diabetes mellitus with other diabetic kidney complication: Secondary | ICD-10-CM | POA: Diagnosis not present

## 2021-07-05 DIAGNOSIS — D631 Anemia in chronic kidney disease: Secondary | ICD-10-CM | POA: Diagnosis not present

## 2021-07-05 DIAGNOSIS — Z992 Dependence on renal dialysis: Secondary | ICD-10-CM | POA: Diagnosis not present

## 2021-07-07 DIAGNOSIS — E1129 Type 2 diabetes mellitus with other diabetic kidney complication: Secondary | ICD-10-CM | POA: Diagnosis not present

## 2021-07-07 DIAGNOSIS — D509 Iron deficiency anemia, unspecified: Secondary | ICD-10-CM | POA: Diagnosis not present

## 2021-07-07 DIAGNOSIS — N2581 Secondary hyperparathyroidism of renal origin: Secondary | ICD-10-CM | POA: Diagnosis not present

## 2021-07-07 DIAGNOSIS — D631 Anemia in chronic kidney disease: Secondary | ICD-10-CM | POA: Diagnosis not present

## 2021-07-07 DIAGNOSIS — N186 End stage renal disease: Secondary | ICD-10-CM | POA: Diagnosis not present

## 2021-07-07 DIAGNOSIS — Z992 Dependence on renal dialysis: Secondary | ICD-10-CM | POA: Diagnosis not present

## 2021-07-07 DIAGNOSIS — Z23 Encounter for immunization: Secondary | ICD-10-CM | POA: Diagnosis not present

## 2021-07-10 DIAGNOSIS — D509 Iron deficiency anemia, unspecified: Secondary | ICD-10-CM | POA: Diagnosis not present

## 2021-07-10 DIAGNOSIS — E1129 Type 2 diabetes mellitus with other diabetic kidney complication: Secondary | ICD-10-CM | POA: Diagnosis not present

## 2021-07-10 DIAGNOSIS — N186 End stage renal disease: Secondary | ICD-10-CM | POA: Diagnosis not present

## 2021-07-10 DIAGNOSIS — N2581 Secondary hyperparathyroidism of renal origin: Secondary | ICD-10-CM | POA: Diagnosis not present

## 2021-07-10 DIAGNOSIS — Z992 Dependence on renal dialysis: Secondary | ICD-10-CM | POA: Diagnosis not present

## 2021-07-10 DIAGNOSIS — D631 Anemia in chronic kidney disease: Secondary | ICD-10-CM | POA: Diagnosis not present

## 2021-07-12 DIAGNOSIS — D509 Iron deficiency anemia, unspecified: Secondary | ICD-10-CM | POA: Diagnosis not present

## 2021-07-12 DIAGNOSIS — D631 Anemia in chronic kidney disease: Secondary | ICD-10-CM | POA: Diagnosis not present

## 2021-07-12 DIAGNOSIS — N2581 Secondary hyperparathyroidism of renal origin: Secondary | ICD-10-CM | POA: Diagnosis not present

## 2021-07-12 DIAGNOSIS — E1129 Type 2 diabetes mellitus with other diabetic kidney complication: Secondary | ICD-10-CM | POA: Diagnosis not present

## 2021-07-12 DIAGNOSIS — Z992 Dependence on renal dialysis: Secondary | ICD-10-CM | POA: Diagnosis not present

## 2021-07-12 DIAGNOSIS — N186 End stage renal disease: Secondary | ICD-10-CM | POA: Diagnosis not present

## 2021-07-14 DIAGNOSIS — Z992 Dependence on renal dialysis: Secondary | ICD-10-CM | POA: Diagnosis not present

## 2021-07-14 DIAGNOSIS — N2581 Secondary hyperparathyroidism of renal origin: Secondary | ICD-10-CM | POA: Diagnosis not present

## 2021-07-14 DIAGNOSIS — N186 End stage renal disease: Secondary | ICD-10-CM | POA: Diagnosis not present

## 2021-07-14 DIAGNOSIS — E1129 Type 2 diabetes mellitus with other diabetic kidney complication: Secondary | ICD-10-CM | POA: Diagnosis not present

## 2021-07-14 DIAGNOSIS — D509 Iron deficiency anemia, unspecified: Secondary | ICD-10-CM | POA: Diagnosis not present

## 2021-07-14 DIAGNOSIS — D631 Anemia in chronic kidney disease: Secondary | ICD-10-CM | POA: Diagnosis not present

## 2021-07-17 DIAGNOSIS — E1129 Type 2 diabetes mellitus with other diabetic kidney complication: Secondary | ICD-10-CM | POA: Diagnosis not present

## 2021-07-17 DIAGNOSIS — N2581 Secondary hyperparathyroidism of renal origin: Secondary | ICD-10-CM | POA: Diagnosis not present

## 2021-07-17 DIAGNOSIS — N186 End stage renal disease: Secondary | ICD-10-CM | POA: Diagnosis not present

## 2021-07-17 DIAGNOSIS — D509 Iron deficiency anemia, unspecified: Secondary | ICD-10-CM | POA: Diagnosis not present

## 2021-07-17 DIAGNOSIS — D631 Anemia in chronic kidney disease: Secondary | ICD-10-CM | POA: Diagnosis not present

## 2021-07-17 DIAGNOSIS — Z992 Dependence on renal dialysis: Secondary | ICD-10-CM | POA: Diagnosis not present

## 2021-07-18 DIAGNOSIS — N2581 Secondary hyperparathyroidism of renal origin: Secondary | ICD-10-CM | POA: Diagnosis not present

## 2021-07-18 DIAGNOSIS — E1129 Type 2 diabetes mellitus with other diabetic kidney complication: Secondary | ICD-10-CM | POA: Diagnosis not present

## 2021-07-18 DIAGNOSIS — N186 End stage renal disease: Secondary | ICD-10-CM | POA: Diagnosis not present

## 2021-07-18 DIAGNOSIS — D509 Iron deficiency anemia, unspecified: Secondary | ICD-10-CM | POA: Diagnosis not present

## 2021-07-18 DIAGNOSIS — D631 Anemia in chronic kidney disease: Secondary | ICD-10-CM | POA: Diagnosis not present

## 2021-07-18 DIAGNOSIS — Z992 Dependence on renal dialysis: Secondary | ICD-10-CM | POA: Diagnosis not present

## 2021-07-21 DIAGNOSIS — Z992 Dependence on renal dialysis: Secondary | ICD-10-CM | POA: Diagnosis not present

## 2021-07-21 DIAGNOSIS — N186 End stage renal disease: Secondary | ICD-10-CM | POA: Diagnosis not present

## 2021-07-24 DIAGNOSIS — N2581 Secondary hyperparathyroidism of renal origin: Secondary | ICD-10-CM | POA: Diagnosis not present

## 2021-07-24 DIAGNOSIS — D509 Iron deficiency anemia, unspecified: Secondary | ICD-10-CM | POA: Diagnosis not present

## 2021-07-24 DIAGNOSIS — Z992 Dependence on renal dialysis: Secondary | ICD-10-CM | POA: Diagnosis not present

## 2021-07-24 DIAGNOSIS — D631 Anemia in chronic kidney disease: Secondary | ICD-10-CM | POA: Diagnosis not present

## 2021-07-24 DIAGNOSIS — E1129 Type 2 diabetes mellitus with other diabetic kidney complication: Secondary | ICD-10-CM | POA: Diagnosis not present

## 2021-07-24 DIAGNOSIS — N186 End stage renal disease: Secondary | ICD-10-CM | POA: Diagnosis not present

## 2021-07-26 DIAGNOSIS — N186 End stage renal disease: Secondary | ICD-10-CM | POA: Diagnosis not present

## 2021-07-26 DIAGNOSIS — D509 Iron deficiency anemia, unspecified: Secondary | ICD-10-CM | POA: Diagnosis not present

## 2021-07-26 DIAGNOSIS — Z992 Dependence on renal dialysis: Secondary | ICD-10-CM | POA: Diagnosis not present

## 2021-07-26 DIAGNOSIS — N2581 Secondary hyperparathyroidism of renal origin: Secondary | ICD-10-CM | POA: Diagnosis not present

## 2021-07-26 DIAGNOSIS — D631 Anemia in chronic kidney disease: Secondary | ICD-10-CM | POA: Diagnosis not present

## 2021-07-26 DIAGNOSIS — E1129 Type 2 diabetes mellitus with other diabetic kidney complication: Secondary | ICD-10-CM | POA: Diagnosis not present

## 2021-07-27 DIAGNOSIS — T8620 Unspecified complication of heart transplant: Secondary | ICD-10-CM | POA: Diagnosis not present

## 2021-07-27 DIAGNOSIS — Z992 Dependence on renal dialysis: Secondary | ICD-10-CM | POA: Diagnosis not present

## 2021-07-27 DIAGNOSIS — N186 End stage renal disease: Secondary | ICD-10-CM | POA: Diagnosis not present

## 2021-07-28 DIAGNOSIS — E1129 Type 2 diabetes mellitus with other diabetic kidney complication: Secondary | ICD-10-CM | POA: Diagnosis not present

## 2021-07-28 DIAGNOSIS — N186 End stage renal disease: Secondary | ICD-10-CM | POA: Diagnosis not present

## 2021-07-28 DIAGNOSIS — Z992 Dependence on renal dialysis: Secondary | ICD-10-CM | POA: Diagnosis not present

## 2021-07-28 DIAGNOSIS — D509 Iron deficiency anemia, unspecified: Secondary | ICD-10-CM | POA: Diagnosis not present

## 2021-07-28 DIAGNOSIS — N2581 Secondary hyperparathyroidism of renal origin: Secondary | ICD-10-CM | POA: Diagnosis not present

## 2021-07-28 DIAGNOSIS — D631 Anemia in chronic kidney disease: Secondary | ICD-10-CM | POA: Diagnosis not present

## 2021-07-31 DIAGNOSIS — E1129 Type 2 diabetes mellitus with other diabetic kidney complication: Secondary | ICD-10-CM | POA: Diagnosis not present

## 2021-07-31 DIAGNOSIS — N186 End stage renal disease: Secondary | ICD-10-CM | POA: Diagnosis not present

## 2021-07-31 DIAGNOSIS — D509 Iron deficiency anemia, unspecified: Secondary | ICD-10-CM | POA: Diagnosis not present

## 2021-07-31 DIAGNOSIS — N2581 Secondary hyperparathyroidism of renal origin: Secondary | ICD-10-CM | POA: Diagnosis not present

## 2021-07-31 DIAGNOSIS — Z992 Dependence on renal dialysis: Secondary | ICD-10-CM | POA: Diagnosis not present

## 2021-07-31 DIAGNOSIS — D631 Anemia in chronic kidney disease: Secondary | ICD-10-CM | POA: Diagnosis not present

## 2021-08-02 DIAGNOSIS — D509 Iron deficiency anemia, unspecified: Secondary | ICD-10-CM | POA: Diagnosis not present

## 2021-08-02 DIAGNOSIS — D631 Anemia in chronic kidney disease: Secondary | ICD-10-CM | POA: Diagnosis not present

## 2021-08-02 DIAGNOSIS — N2581 Secondary hyperparathyroidism of renal origin: Secondary | ICD-10-CM | POA: Diagnosis not present

## 2021-08-02 DIAGNOSIS — Z992 Dependence on renal dialysis: Secondary | ICD-10-CM | POA: Diagnosis not present

## 2021-08-02 DIAGNOSIS — E1129 Type 2 diabetes mellitus with other diabetic kidney complication: Secondary | ICD-10-CM | POA: Diagnosis not present

## 2021-08-02 DIAGNOSIS — N186 End stage renal disease: Secondary | ICD-10-CM | POA: Diagnosis not present

## 2021-08-04 DIAGNOSIS — D631 Anemia in chronic kidney disease: Secondary | ICD-10-CM | POA: Diagnosis not present

## 2021-08-04 DIAGNOSIS — N186 End stage renal disease: Secondary | ICD-10-CM | POA: Diagnosis not present

## 2021-08-04 DIAGNOSIS — E1129 Type 2 diabetes mellitus with other diabetic kidney complication: Secondary | ICD-10-CM | POA: Diagnosis not present

## 2021-08-04 DIAGNOSIS — N2581 Secondary hyperparathyroidism of renal origin: Secondary | ICD-10-CM | POA: Diagnosis not present

## 2021-08-04 DIAGNOSIS — D509 Iron deficiency anemia, unspecified: Secondary | ICD-10-CM | POA: Diagnosis not present

## 2021-08-04 DIAGNOSIS — Z992 Dependence on renal dialysis: Secondary | ICD-10-CM | POA: Diagnosis not present

## 2021-08-07 DIAGNOSIS — Z992 Dependence on renal dialysis: Secondary | ICD-10-CM | POA: Diagnosis not present

## 2021-08-07 DIAGNOSIS — D509 Iron deficiency anemia, unspecified: Secondary | ICD-10-CM | POA: Diagnosis not present

## 2021-08-07 DIAGNOSIS — N186 End stage renal disease: Secondary | ICD-10-CM | POA: Diagnosis not present

## 2021-08-07 DIAGNOSIS — N2581 Secondary hyperparathyroidism of renal origin: Secondary | ICD-10-CM | POA: Diagnosis not present

## 2021-08-07 DIAGNOSIS — D631 Anemia in chronic kidney disease: Secondary | ICD-10-CM | POA: Diagnosis not present

## 2021-08-07 DIAGNOSIS — E1129 Type 2 diabetes mellitus with other diabetic kidney complication: Secondary | ICD-10-CM | POA: Diagnosis not present

## 2021-08-09 DIAGNOSIS — D509 Iron deficiency anemia, unspecified: Secondary | ICD-10-CM | POA: Diagnosis not present

## 2021-08-09 DIAGNOSIS — Z992 Dependence on renal dialysis: Secondary | ICD-10-CM | POA: Diagnosis not present

## 2021-08-09 DIAGNOSIS — N186 End stage renal disease: Secondary | ICD-10-CM | POA: Diagnosis not present

## 2021-08-09 DIAGNOSIS — E1129 Type 2 diabetes mellitus with other diabetic kidney complication: Secondary | ICD-10-CM | POA: Diagnosis not present

## 2021-08-09 DIAGNOSIS — N2581 Secondary hyperparathyroidism of renal origin: Secondary | ICD-10-CM | POA: Diagnosis not present

## 2021-08-09 DIAGNOSIS — D631 Anemia in chronic kidney disease: Secondary | ICD-10-CM | POA: Diagnosis not present

## 2021-08-11 DIAGNOSIS — D509 Iron deficiency anemia, unspecified: Secondary | ICD-10-CM | POA: Diagnosis not present

## 2021-08-11 DIAGNOSIS — E1129 Type 2 diabetes mellitus with other diabetic kidney complication: Secondary | ICD-10-CM | POA: Diagnosis not present

## 2021-08-11 DIAGNOSIS — N186 End stage renal disease: Secondary | ICD-10-CM | POA: Diagnosis not present

## 2021-08-11 DIAGNOSIS — N2581 Secondary hyperparathyroidism of renal origin: Secondary | ICD-10-CM | POA: Diagnosis not present

## 2021-08-11 DIAGNOSIS — Z992 Dependence on renal dialysis: Secondary | ICD-10-CM | POA: Diagnosis not present

## 2021-08-11 DIAGNOSIS — D631 Anemia in chronic kidney disease: Secondary | ICD-10-CM | POA: Diagnosis not present

## 2021-08-14 DIAGNOSIS — E1129 Type 2 diabetes mellitus with other diabetic kidney complication: Secondary | ICD-10-CM | POA: Diagnosis not present

## 2021-08-14 DIAGNOSIS — D509 Iron deficiency anemia, unspecified: Secondary | ICD-10-CM | POA: Diagnosis not present

## 2021-08-14 DIAGNOSIS — Z992 Dependence on renal dialysis: Secondary | ICD-10-CM | POA: Diagnosis not present

## 2021-08-14 DIAGNOSIS — N2581 Secondary hyperparathyroidism of renal origin: Secondary | ICD-10-CM | POA: Diagnosis not present

## 2021-08-14 DIAGNOSIS — D631 Anemia in chronic kidney disease: Secondary | ICD-10-CM | POA: Diagnosis not present

## 2021-08-14 DIAGNOSIS — N186 End stage renal disease: Secondary | ICD-10-CM | POA: Diagnosis not present

## 2021-08-16 DIAGNOSIS — E1129 Type 2 diabetes mellitus with other diabetic kidney complication: Secondary | ICD-10-CM | POA: Diagnosis not present

## 2021-08-16 DIAGNOSIS — D509 Iron deficiency anemia, unspecified: Secondary | ICD-10-CM | POA: Diagnosis not present

## 2021-08-16 DIAGNOSIS — Z992 Dependence on renal dialysis: Secondary | ICD-10-CM | POA: Diagnosis not present

## 2021-08-16 DIAGNOSIS — N2581 Secondary hyperparathyroidism of renal origin: Secondary | ICD-10-CM | POA: Diagnosis not present

## 2021-08-16 DIAGNOSIS — D631 Anemia in chronic kidney disease: Secondary | ICD-10-CM | POA: Diagnosis not present

## 2021-08-16 DIAGNOSIS — N186 End stage renal disease: Secondary | ICD-10-CM | POA: Diagnosis not present

## 2021-08-18 DIAGNOSIS — D631 Anemia in chronic kidney disease: Secondary | ICD-10-CM | POA: Diagnosis not present

## 2021-08-18 DIAGNOSIS — N186 End stage renal disease: Secondary | ICD-10-CM | POA: Diagnosis not present

## 2021-08-18 DIAGNOSIS — D509 Iron deficiency anemia, unspecified: Secondary | ICD-10-CM | POA: Diagnosis not present

## 2021-08-18 DIAGNOSIS — N2581 Secondary hyperparathyroidism of renal origin: Secondary | ICD-10-CM | POA: Diagnosis not present

## 2021-08-18 DIAGNOSIS — Z992 Dependence on renal dialysis: Secondary | ICD-10-CM | POA: Diagnosis not present

## 2021-08-18 DIAGNOSIS — E1129 Type 2 diabetes mellitus with other diabetic kidney complication: Secondary | ICD-10-CM | POA: Diagnosis not present

## 2021-08-21 DIAGNOSIS — Z992 Dependence on renal dialysis: Secondary | ICD-10-CM | POA: Diagnosis not present

## 2021-08-21 DIAGNOSIS — E1129 Type 2 diabetes mellitus with other diabetic kidney complication: Secondary | ICD-10-CM | POA: Diagnosis not present

## 2021-08-21 DIAGNOSIS — D631 Anemia in chronic kidney disease: Secondary | ICD-10-CM | POA: Diagnosis not present

## 2021-08-21 DIAGNOSIS — N2581 Secondary hyperparathyroidism of renal origin: Secondary | ICD-10-CM | POA: Diagnosis not present

## 2021-08-21 DIAGNOSIS — D509 Iron deficiency anemia, unspecified: Secondary | ICD-10-CM | POA: Diagnosis not present

## 2021-08-21 DIAGNOSIS — N186 End stage renal disease: Secondary | ICD-10-CM | POA: Diagnosis not present

## 2021-08-23 DIAGNOSIS — N2581 Secondary hyperparathyroidism of renal origin: Secondary | ICD-10-CM | POA: Diagnosis not present

## 2021-08-23 DIAGNOSIS — D631 Anemia in chronic kidney disease: Secondary | ICD-10-CM | POA: Diagnosis not present

## 2021-08-23 DIAGNOSIS — E1129 Type 2 diabetes mellitus with other diabetic kidney complication: Secondary | ICD-10-CM | POA: Diagnosis not present

## 2021-08-23 DIAGNOSIS — Z992 Dependence on renal dialysis: Secondary | ICD-10-CM | POA: Diagnosis not present

## 2021-08-23 DIAGNOSIS — N186 End stage renal disease: Secondary | ICD-10-CM | POA: Diagnosis not present

## 2021-08-23 DIAGNOSIS — D509 Iron deficiency anemia, unspecified: Secondary | ICD-10-CM | POA: Diagnosis not present

## 2021-08-25 DIAGNOSIS — Z992 Dependence on renal dialysis: Secondary | ICD-10-CM | POA: Diagnosis not present

## 2021-08-25 DIAGNOSIS — D509 Iron deficiency anemia, unspecified: Secondary | ICD-10-CM | POA: Diagnosis not present

## 2021-08-25 DIAGNOSIS — N186 End stage renal disease: Secondary | ICD-10-CM | POA: Diagnosis not present

## 2021-08-25 DIAGNOSIS — N2581 Secondary hyperparathyroidism of renal origin: Secondary | ICD-10-CM | POA: Diagnosis not present

## 2021-08-25 DIAGNOSIS — E1129 Type 2 diabetes mellitus with other diabetic kidney complication: Secondary | ICD-10-CM | POA: Diagnosis not present

## 2021-08-25 DIAGNOSIS — D631 Anemia in chronic kidney disease: Secondary | ICD-10-CM | POA: Diagnosis not present

## 2021-08-27 DIAGNOSIS — N186 End stage renal disease: Secondary | ICD-10-CM | POA: Diagnosis not present

## 2021-08-27 DIAGNOSIS — Z992 Dependence on renal dialysis: Secondary | ICD-10-CM | POA: Diagnosis not present

## 2021-08-27 DIAGNOSIS — T8620 Unspecified complication of heart transplant: Secondary | ICD-10-CM | POA: Diagnosis not present

## 2021-08-28 DIAGNOSIS — D631 Anemia in chronic kidney disease: Secondary | ICD-10-CM | POA: Diagnosis not present

## 2021-08-28 DIAGNOSIS — E1129 Type 2 diabetes mellitus with other diabetic kidney complication: Secondary | ICD-10-CM | POA: Diagnosis not present

## 2021-08-28 DIAGNOSIS — N2581 Secondary hyperparathyroidism of renal origin: Secondary | ICD-10-CM | POA: Diagnosis not present

## 2021-08-28 DIAGNOSIS — N186 End stage renal disease: Secondary | ICD-10-CM | POA: Diagnosis not present

## 2021-08-28 DIAGNOSIS — Z992 Dependence on renal dialysis: Secondary | ICD-10-CM | POA: Diagnosis not present

## 2021-08-28 DIAGNOSIS — E875 Hyperkalemia: Secondary | ICD-10-CM | POA: Diagnosis not present

## 2021-08-28 DIAGNOSIS — D509 Iron deficiency anemia, unspecified: Secondary | ICD-10-CM | POA: Diagnosis not present

## 2021-08-30 DIAGNOSIS — D509 Iron deficiency anemia, unspecified: Secondary | ICD-10-CM | POA: Diagnosis not present

## 2021-08-30 DIAGNOSIS — N186 End stage renal disease: Secondary | ICD-10-CM | POA: Diagnosis not present

## 2021-08-30 DIAGNOSIS — N2581 Secondary hyperparathyroidism of renal origin: Secondary | ICD-10-CM | POA: Diagnosis not present

## 2021-08-30 DIAGNOSIS — Z992 Dependence on renal dialysis: Secondary | ICD-10-CM | POA: Diagnosis not present

## 2021-08-30 DIAGNOSIS — E875 Hyperkalemia: Secondary | ICD-10-CM | POA: Diagnosis not present

## 2021-08-30 DIAGNOSIS — E1129 Type 2 diabetes mellitus with other diabetic kidney complication: Secondary | ICD-10-CM | POA: Diagnosis not present

## 2021-09-01 DIAGNOSIS — N186 End stage renal disease: Secondary | ICD-10-CM | POA: Diagnosis not present

## 2021-09-01 DIAGNOSIS — E875 Hyperkalemia: Secondary | ICD-10-CM | POA: Diagnosis not present

## 2021-09-01 DIAGNOSIS — D509 Iron deficiency anemia, unspecified: Secondary | ICD-10-CM | POA: Diagnosis not present

## 2021-09-01 DIAGNOSIS — Z992 Dependence on renal dialysis: Secondary | ICD-10-CM | POA: Diagnosis not present

## 2021-09-01 DIAGNOSIS — N2581 Secondary hyperparathyroidism of renal origin: Secondary | ICD-10-CM | POA: Diagnosis not present

## 2021-09-01 DIAGNOSIS — E1129 Type 2 diabetes mellitus with other diabetic kidney complication: Secondary | ICD-10-CM | POA: Diagnosis not present

## 2021-09-04 DIAGNOSIS — Z992 Dependence on renal dialysis: Secondary | ICD-10-CM | POA: Diagnosis not present

## 2021-09-04 DIAGNOSIS — D509 Iron deficiency anemia, unspecified: Secondary | ICD-10-CM | POA: Diagnosis not present

## 2021-09-04 DIAGNOSIS — N2581 Secondary hyperparathyroidism of renal origin: Secondary | ICD-10-CM | POA: Diagnosis not present

## 2021-09-04 DIAGNOSIS — E1129 Type 2 diabetes mellitus with other diabetic kidney complication: Secondary | ICD-10-CM | POA: Diagnosis not present

## 2021-09-04 DIAGNOSIS — E875 Hyperkalemia: Secondary | ICD-10-CM | POA: Diagnosis not present

## 2021-09-04 DIAGNOSIS — N186 End stage renal disease: Secondary | ICD-10-CM | POA: Diagnosis not present

## 2021-09-06 DIAGNOSIS — D509 Iron deficiency anemia, unspecified: Secondary | ICD-10-CM | POA: Diagnosis not present

## 2021-09-06 DIAGNOSIS — N2581 Secondary hyperparathyroidism of renal origin: Secondary | ICD-10-CM | POA: Diagnosis not present

## 2021-09-06 DIAGNOSIS — N186 End stage renal disease: Secondary | ICD-10-CM | POA: Diagnosis not present

## 2021-09-06 DIAGNOSIS — E1129 Type 2 diabetes mellitus with other diabetic kidney complication: Secondary | ICD-10-CM | POA: Diagnosis not present

## 2021-09-06 DIAGNOSIS — Z992 Dependence on renal dialysis: Secondary | ICD-10-CM | POA: Diagnosis not present

## 2021-09-06 DIAGNOSIS — E875 Hyperkalemia: Secondary | ICD-10-CM | POA: Diagnosis not present

## 2021-09-07 DIAGNOSIS — E8770 Fluid overload, unspecified: Secondary | ICD-10-CM | POA: Diagnosis not present

## 2021-09-07 DIAGNOSIS — N186 End stage renal disease: Secondary | ICD-10-CM | POA: Diagnosis not present

## 2021-09-07 DIAGNOSIS — Z992 Dependence on renal dialysis: Secondary | ICD-10-CM | POA: Diagnosis not present

## 2021-09-07 DIAGNOSIS — N2581 Secondary hyperparathyroidism of renal origin: Secondary | ICD-10-CM | POA: Diagnosis not present

## 2021-09-08 DIAGNOSIS — N186 End stage renal disease: Secondary | ICD-10-CM | POA: Diagnosis not present

## 2021-09-08 DIAGNOSIS — E1129 Type 2 diabetes mellitus with other diabetic kidney complication: Secondary | ICD-10-CM | POA: Diagnosis not present

## 2021-09-08 DIAGNOSIS — Z992 Dependence on renal dialysis: Secondary | ICD-10-CM | POA: Diagnosis not present

## 2021-09-08 DIAGNOSIS — N2581 Secondary hyperparathyroidism of renal origin: Secondary | ICD-10-CM | POA: Diagnosis not present

## 2021-09-08 DIAGNOSIS — E875 Hyperkalemia: Secondary | ICD-10-CM | POA: Diagnosis not present

## 2021-09-08 DIAGNOSIS — D509 Iron deficiency anemia, unspecified: Secondary | ICD-10-CM | POA: Diagnosis not present

## 2021-09-11 DIAGNOSIS — Z992 Dependence on renal dialysis: Secondary | ICD-10-CM | POA: Diagnosis not present

## 2021-09-11 DIAGNOSIS — D509 Iron deficiency anemia, unspecified: Secondary | ICD-10-CM | POA: Diagnosis not present

## 2021-09-11 DIAGNOSIS — E875 Hyperkalemia: Secondary | ICD-10-CM | POA: Diagnosis not present

## 2021-09-11 DIAGNOSIS — E1129 Type 2 diabetes mellitus with other diabetic kidney complication: Secondary | ICD-10-CM | POA: Diagnosis not present

## 2021-09-11 DIAGNOSIS — N186 End stage renal disease: Secondary | ICD-10-CM | POA: Diagnosis not present

## 2021-09-11 DIAGNOSIS — N2581 Secondary hyperparathyroidism of renal origin: Secondary | ICD-10-CM | POA: Diagnosis not present

## 2021-09-13 DIAGNOSIS — D509 Iron deficiency anemia, unspecified: Secondary | ICD-10-CM | POA: Diagnosis not present

## 2021-09-13 DIAGNOSIS — E1129 Type 2 diabetes mellitus with other diabetic kidney complication: Secondary | ICD-10-CM | POA: Diagnosis not present

## 2021-09-13 DIAGNOSIS — N2581 Secondary hyperparathyroidism of renal origin: Secondary | ICD-10-CM | POA: Diagnosis not present

## 2021-09-13 DIAGNOSIS — Z992 Dependence on renal dialysis: Secondary | ICD-10-CM | POA: Diagnosis not present

## 2021-09-13 DIAGNOSIS — N186 End stage renal disease: Secondary | ICD-10-CM | POA: Diagnosis not present

## 2021-09-13 DIAGNOSIS — E875 Hyperkalemia: Secondary | ICD-10-CM | POA: Diagnosis not present

## 2021-09-15 DIAGNOSIS — Z992 Dependence on renal dialysis: Secondary | ICD-10-CM | POA: Diagnosis not present

## 2021-09-15 DIAGNOSIS — E1129 Type 2 diabetes mellitus with other diabetic kidney complication: Secondary | ICD-10-CM | POA: Diagnosis not present

## 2021-09-15 DIAGNOSIS — N2581 Secondary hyperparathyroidism of renal origin: Secondary | ICD-10-CM | POA: Diagnosis not present

## 2021-09-15 DIAGNOSIS — N186 End stage renal disease: Secondary | ICD-10-CM | POA: Diagnosis not present

## 2021-09-15 DIAGNOSIS — E875 Hyperkalemia: Secondary | ICD-10-CM | POA: Diagnosis not present

## 2021-09-15 DIAGNOSIS — D509 Iron deficiency anemia, unspecified: Secondary | ICD-10-CM | POA: Diagnosis not present

## 2021-09-18 DIAGNOSIS — N186 End stage renal disease: Secondary | ICD-10-CM | POA: Diagnosis not present

## 2021-09-18 DIAGNOSIS — Z992 Dependence on renal dialysis: Secondary | ICD-10-CM | POA: Diagnosis not present

## 2021-09-18 DIAGNOSIS — E1129 Type 2 diabetes mellitus with other diabetic kidney complication: Secondary | ICD-10-CM | POA: Diagnosis not present

## 2021-09-18 DIAGNOSIS — N2581 Secondary hyperparathyroidism of renal origin: Secondary | ICD-10-CM | POA: Diagnosis not present

## 2021-09-18 DIAGNOSIS — E875 Hyperkalemia: Secondary | ICD-10-CM | POA: Diagnosis not present

## 2021-09-18 DIAGNOSIS — D509 Iron deficiency anemia, unspecified: Secondary | ICD-10-CM | POA: Diagnosis not present

## 2021-09-19 DIAGNOSIS — M5416 Radiculopathy, lumbar region: Secondary | ICD-10-CM | POA: Diagnosis not present

## 2021-09-20 DIAGNOSIS — Z992 Dependence on renal dialysis: Secondary | ICD-10-CM | POA: Diagnosis not present

## 2021-09-20 DIAGNOSIS — D509 Iron deficiency anemia, unspecified: Secondary | ICD-10-CM | POA: Diagnosis not present

## 2021-09-20 DIAGNOSIS — E1129 Type 2 diabetes mellitus with other diabetic kidney complication: Secondary | ICD-10-CM | POA: Diagnosis not present

## 2021-09-20 DIAGNOSIS — E875 Hyperkalemia: Secondary | ICD-10-CM | POA: Diagnosis not present

## 2021-09-20 DIAGNOSIS — N2581 Secondary hyperparathyroidism of renal origin: Secondary | ICD-10-CM | POA: Diagnosis not present

## 2021-09-20 DIAGNOSIS — N186 End stage renal disease: Secondary | ICD-10-CM | POA: Diagnosis not present

## 2021-09-22 DIAGNOSIS — N186 End stage renal disease: Secondary | ICD-10-CM | POA: Diagnosis not present

## 2021-09-22 DIAGNOSIS — N2581 Secondary hyperparathyroidism of renal origin: Secondary | ICD-10-CM | POA: Diagnosis not present

## 2021-09-22 DIAGNOSIS — D509 Iron deficiency anemia, unspecified: Secondary | ICD-10-CM | POA: Diagnosis not present

## 2021-09-22 DIAGNOSIS — E875 Hyperkalemia: Secondary | ICD-10-CM | POA: Diagnosis not present

## 2021-09-22 DIAGNOSIS — Z992 Dependence on renal dialysis: Secondary | ICD-10-CM | POA: Diagnosis not present

## 2021-09-22 DIAGNOSIS — E1129 Type 2 diabetes mellitus with other diabetic kidney complication: Secondary | ICD-10-CM | POA: Diagnosis not present

## 2021-09-25 DIAGNOSIS — Z992 Dependence on renal dialysis: Secondary | ICD-10-CM | POA: Diagnosis not present

## 2021-09-25 DIAGNOSIS — E875 Hyperkalemia: Secondary | ICD-10-CM | POA: Diagnosis not present

## 2021-09-25 DIAGNOSIS — N186 End stage renal disease: Secondary | ICD-10-CM | POA: Diagnosis not present

## 2021-09-25 DIAGNOSIS — N2581 Secondary hyperparathyroidism of renal origin: Secondary | ICD-10-CM | POA: Diagnosis not present

## 2021-09-25 DIAGNOSIS — D509 Iron deficiency anemia, unspecified: Secondary | ICD-10-CM | POA: Diagnosis not present

## 2021-09-25 DIAGNOSIS — E1129 Type 2 diabetes mellitus with other diabetic kidney complication: Secondary | ICD-10-CM | POA: Diagnosis not present

## 2021-09-27 DIAGNOSIS — D509 Iron deficiency anemia, unspecified: Secondary | ICD-10-CM | POA: Diagnosis not present

## 2021-09-27 DIAGNOSIS — Z992 Dependence on renal dialysis: Secondary | ICD-10-CM | POA: Diagnosis not present

## 2021-09-27 DIAGNOSIS — D631 Anemia in chronic kidney disease: Secondary | ICD-10-CM | POA: Diagnosis not present

## 2021-09-27 DIAGNOSIS — T8620 Unspecified complication of heart transplant: Secondary | ICD-10-CM | POA: Diagnosis not present

## 2021-09-27 DIAGNOSIS — N186 End stage renal disease: Secondary | ICD-10-CM | POA: Diagnosis not present

## 2021-09-27 DIAGNOSIS — E1129 Type 2 diabetes mellitus with other diabetic kidney complication: Secondary | ICD-10-CM | POA: Diagnosis not present

## 2021-09-27 DIAGNOSIS — N2581 Secondary hyperparathyroidism of renal origin: Secondary | ICD-10-CM | POA: Diagnosis not present

## 2021-09-28 DIAGNOSIS — M5416 Radiculopathy, lumbar region: Secondary | ICD-10-CM | POA: Diagnosis not present

## 2021-09-29 DIAGNOSIS — D631 Anemia in chronic kidney disease: Secondary | ICD-10-CM | POA: Diagnosis not present

## 2021-09-29 DIAGNOSIS — Z992 Dependence on renal dialysis: Secondary | ICD-10-CM | POA: Diagnosis not present

## 2021-09-29 DIAGNOSIS — N186 End stage renal disease: Secondary | ICD-10-CM | POA: Diagnosis not present

## 2021-09-29 DIAGNOSIS — E1129 Type 2 diabetes mellitus with other diabetic kidney complication: Secondary | ICD-10-CM | POA: Diagnosis not present

## 2021-09-29 DIAGNOSIS — D509 Iron deficiency anemia, unspecified: Secondary | ICD-10-CM | POA: Diagnosis not present

## 2021-09-29 DIAGNOSIS — N2581 Secondary hyperparathyroidism of renal origin: Secondary | ICD-10-CM | POA: Diagnosis not present

## 2021-10-02 DIAGNOSIS — Z992 Dependence on renal dialysis: Secondary | ICD-10-CM | POA: Diagnosis not present

## 2021-10-02 DIAGNOSIS — D509 Iron deficiency anemia, unspecified: Secondary | ICD-10-CM | POA: Diagnosis not present

## 2021-10-02 DIAGNOSIS — E1129 Type 2 diabetes mellitus with other diabetic kidney complication: Secondary | ICD-10-CM | POA: Diagnosis not present

## 2021-10-02 DIAGNOSIS — N186 End stage renal disease: Secondary | ICD-10-CM | POA: Diagnosis not present

## 2021-10-02 DIAGNOSIS — D631 Anemia in chronic kidney disease: Secondary | ICD-10-CM | POA: Diagnosis not present

## 2021-10-02 DIAGNOSIS — N2581 Secondary hyperparathyroidism of renal origin: Secondary | ICD-10-CM | POA: Diagnosis not present

## 2021-10-04 DIAGNOSIS — E1129 Type 2 diabetes mellitus with other diabetic kidney complication: Secondary | ICD-10-CM | POA: Diagnosis not present

## 2021-10-04 DIAGNOSIS — N2581 Secondary hyperparathyroidism of renal origin: Secondary | ICD-10-CM | POA: Diagnosis not present

## 2021-10-04 DIAGNOSIS — D631 Anemia in chronic kidney disease: Secondary | ICD-10-CM | POA: Diagnosis not present

## 2021-10-04 DIAGNOSIS — Z992 Dependence on renal dialysis: Secondary | ICD-10-CM | POA: Diagnosis not present

## 2021-10-04 DIAGNOSIS — D509 Iron deficiency anemia, unspecified: Secondary | ICD-10-CM | POA: Diagnosis not present

## 2021-10-04 DIAGNOSIS — N186 End stage renal disease: Secondary | ICD-10-CM | POA: Diagnosis not present

## 2021-10-05 DIAGNOSIS — N186 End stage renal disease: Secondary | ICD-10-CM | POA: Diagnosis not present

## 2021-10-05 DIAGNOSIS — D509 Iron deficiency anemia, unspecified: Secondary | ICD-10-CM | POA: Diagnosis not present

## 2021-10-05 DIAGNOSIS — N2581 Secondary hyperparathyroidism of renal origin: Secondary | ICD-10-CM | POA: Diagnosis not present

## 2021-10-05 DIAGNOSIS — D631 Anemia in chronic kidney disease: Secondary | ICD-10-CM | POA: Diagnosis not present

## 2021-10-05 DIAGNOSIS — E1129 Type 2 diabetes mellitus with other diabetic kidney complication: Secondary | ICD-10-CM | POA: Diagnosis not present

## 2021-10-05 DIAGNOSIS — Z992 Dependence on renal dialysis: Secondary | ICD-10-CM | POA: Diagnosis not present

## 2021-10-09 DIAGNOSIS — D509 Iron deficiency anemia, unspecified: Secondary | ICD-10-CM | POA: Diagnosis not present

## 2021-10-09 DIAGNOSIS — E1129 Type 2 diabetes mellitus with other diabetic kidney complication: Secondary | ICD-10-CM | POA: Diagnosis not present

## 2021-10-09 DIAGNOSIS — N2581 Secondary hyperparathyroidism of renal origin: Secondary | ICD-10-CM | POA: Diagnosis not present

## 2021-10-09 DIAGNOSIS — D631 Anemia in chronic kidney disease: Secondary | ICD-10-CM | POA: Diagnosis not present

## 2021-10-09 DIAGNOSIS — N186 End stage renal disease: Secondary | ICD-10-CM | POA: Diagnosis not present

## 2021-10-09 DIAGNOSIS — Z992 Dependence on renal dialysis: Secondary | ICD-10-CM | POA: Diagnosis not present

## 2021-10-10 DIAGNOSIS — Z8546 Personal history of malignant neoplasm of prostate: Secondary | ICD-10-CM | POA: Diagnosis not present

## 2021-10-10 DIAGNOSIS — E1143 Type 2 diabetes mellitus with diabetic autonomic (poly)neuropathy: Secondary | ICD-10-CM | POA: Diagnosis not present

## 2021-10-10 DIAGNOSIS — N186 End stage renal disease: Secondary | ICD-10-CM | POA: Diagnosis not present

## 2021-10-10 DIAGNOSIS — N185 Chronic kidney disease, stage 5: Secondary | ICD-10-CM | POA: Diagnosis not present

## 2021-10-10 DIAGNOSIS — N2581 Secondary hyperparathyroidism of renal origin: Secondary | ICD-10-CM | POA: Diagnosis not present

## 2021-10-10 DIAGNOSIS — Z941 Heart transplant status: Secondary | ICD-10-CM | POA: Diagnosis not present

## 2021-10-10 DIAGNOSIS — E782 Mixed hyperlipidemia: Secondary | ICD-10-CM | POA: Diagnosis not present

## 2021-10-10 DIAGNOSIS — E8770 Fluid overload, unspecified: Secondary | ICD-10-CM | POA: Diagnosis not present

## 2021-10-10 DIAGNOSIS — Z992 Dependence on renal dialysis: Secondary | ICD-10-CM | POA: Diagnosis not present

## 2021-10-10 DIAGNOSIS — Z9884 Bariatric surgery status: Secondary | ICD-10-CM | POA: Diagnosis not present

## 2021-10-10 DIAGNOSIS — G629 Polyneuropathy, unspecified: Secondary | ICD-10-CM | POA: Diagnosis not present

## 2021-10-11 DIAGNOSIS — D631 Anemia in chronic kidney disease: Secondary | ICD-10-CM | POA: Diagnosis not present

## 2021-10-11 DIAGNOSIS — D509 Iron deficiency anemia, unspecified: Secondary | ICD-10-CM | POA: Diagnosis not present

## 2021-10-11 DIAGNOSIS — Z992 Dependence on renal dialysis: Secondary | ICD-10-CM | POA: Diagnosis not present

## 2021-10-11 DIAGNOSIS — N186 End stage renal disease: Secondary | ICD-10-CM | POA: Diagnosis not present

## 2021-10-11 DIAGNOSIS — E1129 Type 2 diabetes mellitus with other diabetic kidney complication: Secondary | ICD-10-CM | POA: Diagnosis not present

## 2021-10-11 DIAGNOSIS — N2581 Secondary hyperparathyroidism of renal origin: Secondary | ICD-10-CM | POA: Diagnosis not present

## 2021-10-12 ENCOUNTER — Encounter: Payer: Self-pay | Admitting: Neurology

## 2021-10-12 ENCOUNTER — Other Ambulatory Visit: Payer: Self-pay

## 2021-10-12 ENCOUNTER — Ambulatory Visit (INDEPENDENT_AMBULATORY_CARE_PROVIDER_SITE_OTHER): Payer: Medicare Other | Admitting: Neurology

## 2021-10-12 ENCOUNTER — Telehealth: Payer: Self-pay | Admitting: Neurology

## 2021-10-12 VITALS — BP 130/88 | HR 99 | Ht 79.0 in | Wt 315.0 lb

## 2021-10-12 DIAGNOSIS — N185 Chronic kidney disease, stage 5: Secondary | ICD-10-CM

## 2021-10-12 DIAGNOSIS — G63 Polyneuropathy in diseases classified elsewhere: Secondary | ICD-10-CM | POA: Diagnosis not present

## 2021-10-12 DIAGNOSIS — M792 Neuralgia and neuritis, unspecified: Secondary | ICD-10-CM | POA: Insufficient documentation

## 2021-10-12 DIAGNOSIS — R269 Unspecified abnormalities of gait and mobility: Secondary | ICD-10-CM

## 2021-10-12 MED ORDER — DULOXETINE HCL 60 MG PO CPEP: 60.0000 mg | ORAL_CAPSULE | Freq: Every day | ORAL | 11 refills | Status: DC

## 2021-10-12 MED ORDER — OXCARBAZEPINE 150 MG PO TABS: 150.0000 mg | ORAL_TABLET | Freq: Two times a day (BID) | ORAL | 11 refills | Status: DC

## 2021-10-12 NOTE — Telephone Encounter (Signed)
Referral sent to [facility] [phone].

## 2021-10-12 NOTE — Telephone Encounter (Signed)
I called the patient's number and spoke to his wife. He has been scheduled on 11/14/21.

## 2021-10-12 NOTE — Progress Notes (Signed)
Chief Complaint  Patient presents with   New Patient (Initial Visit)    Rm 15, alone Pt states pain is worse in hands, states lyrica is not working for him      ASSESSMENT AND PLAN  [name] is a 61 y.o. male   Peripheral neuropathy  Multiple potential course, end-stage renal disease, history of diabetes, heart transplant, medication toxicity,  He has length-dependent sensory changes, bilateral foot drop,  EMG nerve conduction study  B12, Neuropathic pain  Limited choice of medication due to his hemodialysis, now on Lyrica, managed by nephrologist,  Add on Trileptal 150 twice a day  Refer to pain management   DIAGNOSTIC DATA (LABS, IMAGING, TESTING) - I reviewed patient records, labs, notes, testing and imaging myself where available.   MEDICAL HISTORY:  [name] is a 60 year old male, seen in request by Dr. Madelon Lips, for evaluation of bilateral hands pain, his primary care physician is Dr. Carol Ada, MD   I reviewed and summarized the referring note. PMHX Depression A fib,  ESRD- since 2016, Heart Transplant in 2015, congenital heart disease,  Stroke Prostate cancer radiation  I also reviewed multiple [facility] record, patient was diagnosed with nonischemic cardiomyopathy in 2009, prior to the diagnosis, he underwent lap band placement lost about 100 pounds of weight, he was type 2 diabetes, he was able to came off his diabetic medication, he had heart transplant in 2014, develop end-stage renal failure as a result of acute kidney injury from heart transplant and calcineurin toxicity, he was started on hemodialysis since December 2014, receiving hemodialysis Monday Wednesday Friday, is on the list for kidney transplant   Patient reported mild bilateral toes paresthesia since his heart transplant in 2015, and initiation of hemodialysis, significantly worsened since 2018, within 2 months of time, he noticed quick ascending paresthesia starting from  bilateral toes, feet to bilateral calf, knee level, then bilateral fingertips involvement, he described constant pins and needle sensation, despite Lyrica 100 mg 3 times daily treatment, previously tried and failed gabapentin, Cymbalta caused increasing dreams, he has mild unsteady gait because bilateral feet pain, has to look down at his feet to certain his position.   He is off diabetic medications now, denies bowel and bladder incontinence  He lost to follow-up since initial visit in 2020, today came in complains of slow worsening neuropathic pain involving bilateral hands and feet," my hands and feet are killing me", he complains of difficulty with sleep due to pain, feels like his toes and fingers had frostbite,  He could not sleep at night, so wore out then go to sleep, his hand and feet is killing me, he has to move to ease the pain, previously he has tried gabapentin, not taking Lyrica 100 mg daily, dosage limitation due to end-stage renal disease  He also complains of low back pain, radiating pain to bilateral lower extremity, recently went through epidural injection without significant improvement  Personally reviewed MRI of lumbar April 2022,  1. Severe L4-5 facet arthrosis with trace anterolisthesis and mild bilateral neural foraminal stenosis. 2. Disc degeneration greatest at L5-S1 where there is mild left neural foraminal stenosis. 3. No spinal stenosis.   PHYSICAL EXAM:   Vitals:   10/12/21 0919  BP: 130/88  Pulse: 99  Weight: (!) 315 lb (142.9 kg)  Height: 6\' 7"  (2.007 m)   Not recorded     Body mass index is 35.49 kg/m.  PHYSICAL EXAMNIATION:  Gen: NAD, conversant, well nourised, well  groomed                     Cardiovascular: Regular rate rhythm, no peripheral edema, warm, nontender. Eyes: Conjunctivae clear without exudates or hemorrhage Neck: Supple, no carotid bruits. Pulmonary: Clear to auscultation bilaterally   NEUROLOGICAL EXAM:  MENTAL  STATUS: Speech/cognition: Very depressed looking middle-age male Awake, alert, oriented to history taking and casual conversation   CRANIAL NERVES: CN II: Visual fields are full to confrontation. Pupils are round equal and briskly reactive to light. CN III, IV, VI: extraocular movement are normal. No ptosis. CN V: Facial sensation is intact to light touch CN VII: Face is symmetric with normal eye closure  CN VIII: Hearing is normal to causal conversation. CN IX, X: Phonation is normal. CN XI: Head turning and shoulder shrug are intact  MOTOR: Moderate bilateral ankle dorsiflexion weakness  REFLEXES: Reflexes are absent, plantar responses are flexor.  SENSORY: Length dependent decreased light touch, pinprick, vibratory sensation to mid shin level  COORDINATION: There is no trunk or limb dysmetria noted.  GAIT/STANCE: He needs push-up to get up from seated position, unsteady, bilateral foot drop, bent forward, limited by his body weight, and low back pain as well  REVIEW OF SYSTEMS:  Full 14 system review of systems performed and notable only for as above All other review of systems were negative.   ALLERGIES: Allergies  Allergen Reactions   Heparin Nausea Only, Swelling and Other (See Comments)     * * HIT * * SWELLING REACTION UNSPECIFIED  DIAPHORESIS   Penicillin G Potassium [Penicillin G] Nausea Only and Other (See Comments)    DIAPHORESIS    HOME MEDICATIONS: Current Outpatient Medications  Medication Sig Dispense Refill   amitriptyline (ELAVIL) 25 MG tablet TAKE 1 TABLET(25 MG) BY MOUTH AT BEDTIME 30 tablet 1   B Complex-C-Folic Acid (RENO CAPS) 1 MG CAPS Take 1 mg by mouth daily.  3   cinacalcet (SENSIPAR) 60 MG tablet Take 60 mg by mouth daily.     citalopram (CELEXA) 20 MG tablet Take 20 mg by mouth daily.     cycloSPORINE modified (NEORAL) 100 MG capsule Take 100 mg by mouth See admin instructions. Take with three 25 mg  (75 ) for a total of 175 mg twice a day      cycloSPORINE modified (NEORAL) 25 MG capsule Take 75 mg by mouth See admin instructions. Take With 100 mg tab for a total of 175 mg twice a day     ELIQUIS 2.5 MG TABS tablet Take 1 tablet (2.5 mg total) by mouth 2 (two) times daily. 60 tablet    lidocaine (XYLOCAINE) 5 % ointment Apply 1 application topically as needed. 35.44 g 0   lidocaine-prilocaine (EMLA) cream Apply 1 application topically as needed.     midodrine (PROAMATINE) 10 MG tablet Take 1 tablet (10 mg total) by mouth 3 (three) times daily.     mycophenolate (MYFORTIC) 180 MG EC tablet Take 720 mg by mouth 2 (two) times daily.     ondansetron (ZOFRAN) 4 MG tablet Take 1 tablet (4 mg total) by mouth every 6 (six) hours as needed for nausea. 20 tablet 0   OXcarbazepine (TRILEPTAL) 150 MG tablet Take 1 tablet (150 mg total) by mouth 2 (two) times daily. 60 tablet 11   oxybutynin (DITROPAN) 5 MG tablet Take 1 tablet (5 mg total) by mouth every 8 (eight) hours as needed for bladder spasms. 10 tablet 0   pregabalin (  LYRICA) 25 MG capsule TAKE 5 CAPSULES BY MOUTH AFTER DIALYSIS 60 capsule 1   pregabalin (LYRICA) 75 MG capsule TAKE 1 CAPSULE(75 MG) BY MOUTH DAILY (Patient taking differently: Take 100 mg by mouth daily.) 30 capsule 0   sevelamer carbonate (RENVELA) 800 MG tablet Take 2 tablets (1,600 mg total) by mouth 3 (three) times daily with meals. 120 tablet 0   No current facility-administered medications for this visit.    PAST MEDICAL HISTORY: Past Medical History:  Diagnosis Date   Arthritis    Atrial fibrillation (HCC)    CHF (congestive heart failure), NYHA class III (HCC)    s/p heart transplant   Chronic kidney disease    end stage   Chronic systolic dysfunction of left ventricle    CVA (cerebral infarction)    Diabetes mellitus    PMH; Prior to heart transplant   Family history of coronary artery disease    in both parents   GERD (gastroesophageal reflux disease)    Hyperlipidemia    Hypertension    Left  bundle branch block    Morbid obesity (HCC)    status post lap band   Myocardial infarction [facility])    prior to heart transplant   Nonischemic cardiomyopathy ([place])    prior to heart transplant   Obesity (BMI 30-39.9)    Obstructive sleep apnea    no longer needs CPAP after heart transplant per pt   Premature ventricular contractions    Prostate cancer ([place])    SOB (shortness of breath)     PAST SURGICAL HISTORY: Past Surgical History:  Procedure Laterality Date   CARDIAC PACEMAKER PLACEMENT  09/21/2009   Biventricular implantable cardioverter-defibrillator implantation      COLONOSCOPY W/ BIOPSIES AND POLYPECTOMY     CYSTOSCOPY WITH FULGERATION N/A 04/27/2021   Procedure: CYSTOSCOPY AND CLOT EVACUATION WITH BLADDER BIOPSY/ FUGARATION OF BLADDER/ FULGARATION OF PROSTATE ;  Surgeon: [name], MD;  Location: [facility];  Service: Urology;  Laterality: N/A;   FOOT SURGERY     left   HEART TRANSPLANT  2014   LAPAROSCOPIC GASTRIC BANDING  01/27/2007   LEFT VENTRICULAR ASSIST DEVICE     implanted at [place] Right 07/22/2017   Procedure: RIGHT TOTAL HIP ARTHROPLASTY ANTERIOR APPROACH;  Surgeon: Rod Can, MD;  Location: [facility];  Service: Orthopedics;  Laterality: Right;  Needs RNFA   TOTAL KNEE ARTHROPLASTY Right 07/22/2017    FAMILY HISTORY: Family History  Problem Relation Age of Onset   Coronary artery disease Father        had, PTCA & CABG   Heart attack Father    Hypertension Father    Diabetes Father    Prostate cancer Father    Coronary artery disease Mother    Heart attack Mother    Hypertension Mother    Stroke Mother    Lupus Mother    Prostate cancer Brother    Coronary artery disease Brother    Coronary artery disease Sister    Heart attack Sister    Prostate cancer Paternal Uncle    Coronary artery disease Maternal Grandmother    Coronary artery disease Maternal Grandfather    Coronary  artery disease Paternal Grandmother    Coronary artery disease Paternal Grandfather     SOCIAL HISTORY: Social History   Socioeconomic History   Marital status: Married    Spouse name: Nicki Reaper   Number  of children: 1   Years of education: 16   Highest education level: Not on file  Occupational History   Occupation: retired    Fish farm manager: [employer]  Tobacco Use   Smoking status: Never   Smokeless tobacco: Former    Types: Snuff  Vaping Use   Vaping Use: Never used  Substance and Sexual Activity   Alcohol use: No   Drug use: No   Sexual activity: Not Currently  Other Topics Concern   Not on file  Social History Narrative   Lives with wife in a one story home.  Has one child.     Retired from the [employer].  Supervisor over transportation.    Education: college.    Social Determinants of Health   Financial Resource Strain: Not on file  Food Insecurity: Not on file  Transportation Needs: Not on file  Physical Activity: Not on file  Stress: Not on file  Social Connections: Not on file  Intimate Partner Violence: Not on file      Marcial Pacas, M.D. Ph.D.  [facility] [address] [place], [place] [address] Ph: [phone] Fax: [phone]  CC:  Madelon Lips, MD [place],  [place] [address]  Carol Ada, MD

## 2021-10-12 NOTE — Telephone Encounter (Signed)
I was not able to schedule tests because the patient can only do them on a Tuesday or Thursday due to dialysis. I let him know we will create an appointment for him and call him back

## 2021-10-13 DIAGNOSIS — E1129 Type 2 diabetes mellitus with other diabetic kidney complication: Secondary | ICD-10-CM | POA: Diagnosis not present

## 2021-10-13 DIAGNOSIS — D509 Iron deficiency anemia, unspecified: Secondary | ICD-10-CM | POA: Diagnosis not present

## 2021-10-13 DIAGNOSIS — D631 Anemia in chronic kidney disease: Secondary | ICD-10-CM | POA: Diagnosis not present

## 2021-10-13 DIAGNOSIS — N2581 Secondary hyperparathyroidism of renal origin: Secondary | ICD-10-CM | POA: Diagnosis not present

## 2021-10-13 DIAGNOSIS — Z992 Dependence on renal dialysis: Secondary | ICD-10-CM | POA: Diagnosis not present

## 2021-10-13 DIAGNOSIS — N186 End stage renal disease: Secondary | ICD-10-CM | POA: Diagnosis not present

## 2021-10-13 LAB — VITAMIN D 25 HYDROXY (VIT D DEFICIENCY, FRACTURES): Vit D, 25-Hydroxy: 18.4 ng/mL — ABNORMAL LOW (ref 30.0–100.0)

## 2021-10-13 LAB — VITAMIN B12: Vitamin B-12: 1143 pg/mL (ref 232–1245)

## 2021-10-16 DIAGNOSIS — D509 Iron deficiency anemia, unspecified: Secondary | ICD-10-CM | POA: Diagnosis not present

## 2021-10-16 DIAGNOSIS — Z992 Dependence on renal dialysis: Secondary | ICD-10-CM | POA: Diagnosis not present

## 2021-10-16 DIAGNOSIS — N186 End stage renal disease: Secondary | ICD-10-CM | POA: Diagnosis not present

## 2021-10-16 DIAGNOSIS — E1129 Type 2 diabetes mellitus with other diabetic kidney complication: Secondary | ICD-10-CM | POA: Diagnosis not present

## 2021-10-16 DIAGNOSIS — D631 Anemia in chronic kidney disease: Secondary | ICD-10-CM | POA: Diagnosis not present

## 2021-10-16 DIAGNOSIS — N2581 Secondary hyperparathyroidism of renal origin: Secondary | ICD-10-CM | POA: Diagnosis not present

## 2021-10-17 DIAGNOSIS — Z298 Encounter for other specified prophylactic measures: Secondary | ICD-10-CM | POA: Diagnosis not present

## 2021-10-17 DIAGNOSIS — Z941 Heart transplant status: Secondary | ICD-10-CM | POA: Diagnosis not present

## 2021-10-17 DIAGNOSIS — G4709 Other insomnia: Secondary | ICD-10-CM | POA: Diagnosis not present

## 2021-10-17 DIAGNOSIS — Z4821 Encounter for aftercare following heart transplant: Secondary | ICD-10-CM | POA: Diagnosis not present

## 2021-10-17 DIAGNOSIS — Z992 Dependence on renal dialysis: Secondary | ICD-10-CM | POA: Diagnosis not present

## 2021-10-17 DIAGNOSIS — Z125 Encounter for screening for malignant neoplasm of prostate: Secondary | ICD-10-CM | POA: Diagnosis not present

## 2021-10-17 DIAGNOSIS — N186 End stage renal disease: Secondary | ICD-10-CM | POA: Diagnosis not present

## 2021-10-17 DIAGNOSIS — E785 Hyperlipidemia, unspecified: Secondary | ICD-10-CM | POA: Diagnosis not present

## 2021-10-17 DIAGNOSIS — Z79899 Other long term (current) drug therapy: Secondary | ICD-10-CM | POA: Diagnosis not present

## 2021-10-17 DIAGNOSIS — Z48298 Encounter for aftercare following other organ transplant: Secondary | ICD-10-CM | POA: Diagnosis not present

## 2021-10-18 DIAGNOSIS — N2581 Secondary hyperparathyroidism of renal origin: Secondary | ICD-10-CM | POA: Diagnosis not present

## 2021-10-18 DIAGNOSIS — D509 Iron deficiency anemia, unspecified: Secondary | ICD-10-CM | POA: Diagnosis not present

## 2021-10-18 DIAGNOSIS — N186 End stage renal disease: Secondary | ICD-10-CM | POA: Diagnosis not present

## 2021-10-18 DIAGNOSIS — D631 Anemia in chronic kidney disease: Secondary | ICD-10-CM | POA: Diagnosis not present

## 2021-10-18 DIAGNOSIS — E1129 Type 2 diabetes mellitus with other diabetic kidney complication: Secondary | ICD-10-CM | POA: Diagnosis not present

## 2021-10-18 DIAGNOSIS — Z992 Dependence on renal dialysis: Secondary | ICD-10-CM | POA: Diagnosis not present

## 2021-10-19 DIAGNOSIS — D849 Immunodeficiency, unspecified: Secondary | ICD-10-CM | POA: Diagnosis not present

## 2021-10-19 DIAGNOSIS — Z48298 Encounter for aftercare following other organ transplant: Secondary | ICD-10-CM | POA: Diagnosis not present

## 2021-10-19 DIAGNOSIS — Z941 Heart transplant status: Secondary | ICD-10-CM | POA: Diagnosis not present

## 2021-10-20 DIAGNOSIS — N186 End stage renal disease: Secondary | ICD-10-CM | POA: Diagnosis not present

## 2021-10-20 DIAGNOSIS — E1129 Type 2 diabetes mellitus with other diabetic kidney complication: Secondary | ICD-10-CM | POA: Diagnosis not present

## 2021-10-20 DIAGNOSIS — D509 Iron deficiency anemia, unspecified: Secondary | ICD-10-CM | POA: Diagnosis not present

## 2021-10-20 DIAGNOSIS — N2581 Secondary hyperparathyroidism of renal origin: Secondary | ICD-10-CM | POA: Diagnosis not present

## 2021-10-20 DIAGNOSIS — D631 Anemia in chronic kidney disease: Secondary | ICD-10-CM | POA: Diagnosis not present

## 2021-10-20 DIAGNOSIS — Z992 Dependence on renal dialysis: Secondary | ICD-10-CM | POA: Diagnosis not present

## 2021-10-23 DIAGNOSIS — D631 Anemia in chronic kidney disease: Secondary | ICD-10-CM | POA: Diagnosis not present

## 2021-10-23 DIAGNOSIS — E1129 Type 2 diabetes mellitus with other diabetic kidney complication: Secondary | ICD-10-CM | POA: Diagnosis not present

## 2021-10-23 DIAGNOSIS — N2581 Secondary hyperparathyroidism of renal origin: Secondary | ICD-10-CM | POA: Diagnosis not present

## 2021-10-23 DIAGNOSIS — D509 Iron deficiency anemia, unspecified: Secondary | ICD-10-CM | POA: Diagnosis not present

## 2021-10-23 DIAGNOSIS — N186 End stage renal disease: Secondary | ICD-10-CM | POA: Diagnosis not present

## 2021-10-23 DIAGNOSIS — Z992 Dependence on renal dialysis: Secondary | ICD-10-CM | POA: Diagnosis not present

## 2021-10-25 DIAGNOSIS — N2581 Secondary hyperparathyroidism of renal origin: Secondary | ICD-10-CM | POA: Diagnosis not present

## 2021-10-25 DIAGNOSIS — T8620 Unspecified complication of heart transplant: Secondary | ICD-10-CM | POA: Diagnosis not present

## 2021-10-25 DIAGNOSIS — Z992 Dependence on renal dialysis: Secondary | ICD-10-CM | POA: Diagnosis not present

## 2021-10-25 DIAGNOSIS — D631 Anemia in chronic kidney disease: Secondary | ICD-10-CM | POA: Diagnosis not present

## 2021-10-25 DIAGNOSIS — E1129 Type 2 diabetes mellitus with other diabetic kidney complication: Secondary | ICD-10-CM | POA: Diagnosis not present

## 2021-10-25 DIAGNOSIS — N186 End stage renal disease: Secondary | ICD-10-CM | POA: Diagnosis not present

## 2021-10-25 DIAGNOSIS — D509 Iron deficiency anemia, unspecified: Secondary | ICD-10-CM | POA: Diagnosis not present

## 2021-10-27 DIAGNOSIS — Z992 Dependence on renal dialysis: Secondary | ICD-10-CM | POA: Diagnosis not present

## 2021-10-27 DIAGNOSIS — N186 End stage renal disease: Secondary | ICD-10-CM | POA: Diagnosis not present

## 2021-10-27 DIAGNOSIS — E1129 Type 2 diabetes mellitus with other diabetic kidney complication: Secondary | ICD-10-CM | POA: Diagnosis not present

## 2021-10-27 DIAGNOSIS — N2581 Secondary hyperparathyroidism of renal origin: Secondary | ICD-10-CM | POA: Diagnosis not present

## 2021-10-27 DIAGNOSIS — D509 Iron deficiency anemia, unspecified: Secondary | ICD-10-CM | POA: Diagnosis not present

## 2021-10-27 DIAGNOSIS — D631 Anemia in chronic kidney disease: Secondary | ICD-10-CM | POA: Diagnosis not present

## 2021-10-30 DIAGNOSIS — Z992 Dependence on renal dialysis: Secondary | ICD-10-CM | POA: Diagnosis not present

## 2021-10-30 DIAGNOSIS — E1129 Type 2 diabetes mellitus with other diabetic kidney complication: Secondary | ICD-10-CM | POA: Diagnosis not present

## 2021-10-30 DIAGNOSIS — D631 Anemia in chronic kidney disease: Secondary | ICD-10-CM | POA: Diagnosis not present

## 2021-10-30 DIAGNOSIS — N186 End stage renal disease: Secondary | ICD-10-CM | POA: Diagnosis not present

## 2021-10-30 DIAGNOSIS — N2581 Secondary hyperparathyroidism of renal origin: Secondary | ICD-10-CM | POA: Diagnosis not present

## 2021-10-30 DIAGNOSIS — D509 Iron deficiency anemia, unspecified: Secondary | ICD-10-CM | POA: Diagnosis not present

## 2021-11-01 DIAGNOSIS — Z992 Dependence on renal dialysis: Secondary | ICD-10-CM | POA: Diagnosis not present

## 2021-11-01 DIAGNOSIS — N2581 Secondary hyperparathyroidism of renal origin: Secondary | ICD-10-CM | POA: Diagnosis not present

## 2021-11-01 DIAGNOSIS — D509 Iron deficiency anemia, unspecified: Secondary | ICD-10-CM | POA: Diagnosis not present

## 2021-11-01 DIAGNOSIS — D631 Anemia in chronic kidney disease: Secondary | ICD-10-CM | POA: Diagnosis not present

## 2021-11-01 DIAGNOSIS — N186 End stage renal disease: Secondary | ICD-10-CM | POA: Diagnosis not present

## 2021-11-01 DIAGNOSIS — E1129 Type 2 diabetes mellitus with other diabetic kidney complication: Secondary | ICD-10-CM | POA: Diagnosis not present

## 2021-11-03 DIAGNOSIS — Z992 Dependence on renal dialysis: Secondary | ICD-10-CM | POA: Diagnosis not present

## 2021-11-03 DIAGNOSIS — D631 Anemia in chronic kidney disease: Secondary | ICD-10-CM | POA: Diagnosis not present

## 2021-11-03 DIAGNOSIS — N186 End stage renal disease: Secondary | ICD-10-CM | POA: Diagnosis not present

## 2021-11-03 DIAGNOSIS — N2581 Secondary hyperparathyroidism of renal origin: Secondary | ICD-10-CM | POA: Diagnosis not present

## 2021-11-03 DIAGNOSIS — E1129 Type 2 diabetes mellitus with other diabetic kidney complication: Secondary | ICD-10-CM | POA: Diagnosis not present

## 2021-11-03 DIAGNOSIS — D509 Iron deficiency anemia, unspecified: Secondary | ICD-10-CM | POA: Diagnosis not present

## 2021-11-06 DIAGNOSIS — D509 Iron deficiency anemia, unspecified: Secondary | ICD-10-CM | POA: Diagnosis not present

## 2021-11-06 DIAGNOSIS — E1129 Type 2 diabetes mellitus with other diabetic kidney complication: Secondary | ICD-10-CM | POA: Diagnosis not present

## 2021-11-06 DIAGNOSIS — N186 End stage renal disease: Secondary | ICD-10-CM | POA: Diagnosis not present

## 2021-11-06 DIAGNOSIS — N2581 Secondary hyperparathyroidism of renal origin: Secondary | ICD-10-CM | POA: Diagnosis not present

## 2021-11-06 DIAGNOSIS — D631 Anemia in chronic kidney disease: Secondary | ICD-10-CM | POA: Diagnosis not present

## 2021-11-06 DIAGNOSIS — Z992 Dependence on renal dialysis: Secondary | ICD-10-CM | POA: Diagnosis not present

## 2021-11-08 DIAGNOSIS — D631 Anemia in chronic kidney disease: Secondary | ICD-10-CM | POA: Diagnosis not present

## 2021-11-08 DIAGNOSIS — N2581 Secondary hyperparathyroidism of renal origin: Secondary | ICD-10-CM | POA: Diagnosis not present

## 2021-11-08 DIAGNOSIS — Z992 Dependence on renal dialysis: Secondary | ICD-10-CM | POA: Diagnosis not present

## 2021-11-08 DIAGNOSIS — E1129 Type 2 diabetes mellitus with other diabetic kidney complication: Secondary | ICD-10-CM | POA: Diagnosis not present

## 2021-11-08 DIAGNOSIS — N186 End stage renal disease: Secondary | ICD-10-CM | POA: Diagnosis not present

## 2021-11-08 DIAGNOSIS — D509 Iron deficiency anemia, unspecified: Secondary | ICD-10-CM | POA: Diagnosis not present

## 2021-11-10 DIAGNOSIS — D509 Iron deficiency anemia, unspecified: Secondary | ICD-10-CM | POA: Diagnosis not present

## 2021-11-10 DIAGNOSIS — E1129 Type 2 diabetes mellitus with other diabetic kidney complication: Secondary | ICD-10-CM | POA: Diagnosis not present

## 2021-11-10 DIAGNOSIS — N2581 Secondary hyperparathyroidism of renal origin: Secondary | ICD-10-CM | POA: Diagnosis not present

## 2021-11-10 DIAGNOSIS — Z992 Dependence on renal dialysis: Secondary | ICD-10-CM | POA: Diagnosis not present

## 2021-11-10 DIAGNOSIS — N186 End stage renal disease: Secondary | ICD-10-CM | POA: Diagnosis not present

## 2021-11-10 DIAGNOSIS — D631 Anemia in chronic kidney disease: Secondary | ICD-10-CM | POA: Diagnosis not present

## 2021-11-13 DIAGNOSIS — Z992 Dependence on renal dialysis: Secondary | ICD-10-CM | POA: Diagnosis not present

## 2021-11-13 DIAGNOSIS — N2581 Secondary hyperparathyroidism of renal origin: Secondary | ICD-10-CM | POA: Diagnosis not present

## 2021-11-13 DIAGNOSIS — N186 End stage renal disease: Secondary | ICD-10-CM | POA: Diagnosis not present

## 2021-11-13 DIAGNOSIS — D509 Iron deficiency anemia, unspecified: Secondary | ICD-10-CM | POA: Diagnosis not present

## 2021-11-13 DIAGNOSIS — D631 Anemia in chronic kidney disease: Secondary | ICD-10-CM | POA: Diagnosis not present

## 2021-11-13 DIAGNOSIS — E1129 Type 2 diabetes mellitus with other diabetic kidney complication: Secondary | ICD-10-CM | POA: Diagnosis not present

## 2021-11-14 ENCOUNTER — Other Ambulatory Visit: Payer: Self-pay

## 2021-11-14 ENCOUNTER — Ambulatory Visit (INDEPENDENT_AMBULATORY_CARE_PROVIDER_SITE_OTHER): Payer: Medicare Other | Admitting: Neurology

## 2021-11-14 DIAGNOSIS — G6289 Other specified polyneuropathies: Secondary | ICD-10-CM

## 2021-11-14 DIAGNOSIS — R269 Unspecified abnormalities of gait and mobility: Secondary | ICD-10-CM | POA: Diagnosis not present

## 2021-11-14 DIAGNOSIS — R202 Paresthesia of skin: Secondary | ICD-10-CM | POA: Diagnosis not present

## 2021-11-14 DIAGNOSIS — N185 Chronic kidney disease, stage 5: Secondary | ICD-10-CM | POA: Insufficient documentation

## 2021-11-14 DIAGNOSIS — G63 Polyneuropathy in diseases classified elsewhere: Secondary | ICD-10-CM

## 2021-11-14 DIAGNOSIS — M792 Neuralgia and neuritis, unspecified: Secondary | ICD-10-CM | POA: Diagnosis not present

## 2021-11-14 NOTE — Procedures (Signed)
? ? ? ?   ?Full Name: [name] Gender: Male ?MRN #: [id number] Date of Birth: 1961-07-26 ?   ?Visit Date: 11/14/2021 15:12 ?Age: 61 Years ?Examining Physician: Marcial Pacas, MD  ?Referring Physician: Marcial Pacas, MD ?Height: 6 feet 7 inch ?Patient History: 61 year old male, end-stage renal disease, history of heart transplant, presented with progressive worsening ascending paresthesia, gait abnormality, distal weakness ? ?Summary of the test: ?Nerve conduction study: ?Right sural, superficial peroneal, ulnar sensory responses were absent. ? ?Right tibial, peroneal to EDB motor responses were absent.  Right ulnar motor response showed normal distal latency, severely decreased CMAP amplitude, with slow conduction velocity 35 m/s ? ?Right median motor response showed borderline prolonged distal latency, normal CMAP amplitude, slow conduction velocity 30 m/s ? ?Right median and ulnar F-wave latencies were prolonged. ? ?Electromyography: ? ?Selected needle examinations of bilateral lower extremity, bilateral upper extremity muscle, bilateral lumbosacral and cervical paraspinal muscles were performed. ? ?There is evidence of chronic neuropathic changes involving bilateral tibialis anterior, tibialis posterior, peroneal longus, medial gastrocnemius, vastus lateralis. ? ?There was increased insertional activity at bilateral lumbosacral paraspinal muscles, no evidence of spontaneous activity ? ?There is also milder chronic neuropathic changes involving bilateral cervical myotomes, there is increased insertional activity of bilateral cervical paraspinal muscles. ? ? ?Conclusion: ?This is an abnormal study.  There is evidence of severe sensorimotor polyneuropathy, with mixed axonal and demyelinating natures, as evidenced by severely prolonged F-wave latency, moderate slow conduction velocity at right median, and ulnar motor responses.  I could not rule out the possibility of superimposed bilateral cervical  radiculopathy. ? ?------------------------------- ?Marcial Pacas, M.D. PhD ? ?[facility] ?[address], [address] ?[place], [place] [address] ?Tel: [phone] ?Fax: [phone] ? ?Verbal informed consent was obtained from the patient, patient was informed of potential risk of procedure, including bruising, bleeding, hematoma formation, infection, muscle weakness, muscle pain, numbness, among others. ?   ? ?   ?[name] ?   ?Nerve / Sites Muscle Latency Ref. Amplitude Ref. Rel Amp Segments Distance Velocity Ref. Area  ?  ms ms mV mV %  cm m/s m/s mVms  ?R Median - APB  ?   Wrist APB 4.4 ?4.4 7.5 ?4.0 100 Wrist - APB 7   26.1  ?   Upper arm APB 12.9  5.6  74.6 Upper arm - Wrist 26 30 ?49 21.3  ?R Ulnar - ADM  ?   Wrist ADM 2.8 ?3.3 1.2 ?6.0 100 Wrist - ADM 7   5.0  ?   B.Elbow ADM 9.4  3.7  306 B.Elbow - Wrist 23 35 ?49 15.4  ?   A.Elbow ADM 12.0  3.4  93.1 A.Elbow - B.Elbow 10 37 ?49 14.3  ?R Peroneal - EDB  ?   Ankle EDB NR ?6.5 NR ?2.0 NR Ankle - EDB 9   NR  ?   Fib head EDB NR  NR  NR Fib head - Ankle 38 NR ?44 NR  ?   Pop fossa EDB NR  NR  NR Pop fossa - Fib head 10 NR ?44 NR  ?       Pop fossa - Ankle      ?R Tibial - AH  ?   Ankle AH NR ?5.8 NR ?4.0 NR Ankle - AH 9   NR  ?   Pop fossa AH NR  NR  NR Pop fossa - Ankle 54 NR ?41 NR  ?           ?  [name] ?   ?Nerve / Sites Rec. Site Peak Lat Ref.  Amp Ref. Segments Distance  ?  ms ms ?V ?V  cm  ?R Sural - Ankle (Calf)  ?   Calf Ankle NR ?4.4 NR ?6 Calf - Ankle 14  ?R Superficial peroneal - Ankle  ?   Lat leg Ankle NR ?4.4 NR ?6 Lat leg - Ankle 12  ?R Median - Orthodromic (Dig II, Mid palm)  ?   Dig II Wrist NR ?3.4 NR ?10 Dig II - Wrist 13  ?R Ulnar - Orthodromic, (Dig V, Mid palm)  ?   Dig V Wrist NR ?3.1 NR ?5 Dig V - Wrist 11  ?           ?F  Wave ?   ?Nerve F Lat Ref.  ? ms ms  ?R Tibial - AH NR ?56.0  ?R Median - APB 48.8 ?31.0  ?R Ulnar - ADM 49.7 ?32.0  ?         ?EMG Summary Table   ? Spontaneous MUAP Recruitment  ?Muscle IA Fib PSW Fasc Other Amp  Dur. Poly Pattern  ?R. Tibialis anterior Normal None None None _______ Normal Normal Normal Normal  ?R. Tibialis posterior Normal None None None _______ Normal Normal Normal Normal  ?R. Peroneus longus Normal None None None _______ Normal Normal Normal Normal  ?R. Gastrocnemius (Medial head) Normal None None None _______ Normal Normal Normal Normal  ?R. Vastus lateralis Normal None None None _______ Normal Normal Normal Normal  ?L. Tibialis anterior Increased None None None _______ Increased Increased 1+ Reduced  ?L. Tibialis posterior Increased None None None _______ Increased Increased 1+ Reduced  ?L. Peroneus longus Increased None None None _______ Increased Increased 1+ Reduced  ?L. Gastrocnemius (Medial head) Increased None None None _______ Increased Increased 1+ Reduced  ?L. Vastus lateralis Normal None None None _______ Increased Increased 1+ Reduced  ?R. Lumbar paraspinals (low) Normal None None None _______ Normal Normal Normal Normal  ?R. Lumbar paraspinals (mid) Normal None None None _______ Normal Normal Normal Normal  ?L. Lumbar paraspinals (low) Normal None None None _______ Normal Normal Normal Normal  ?L. Lumbar paraspinals (mid) Normal None None None _______ Normal Normal Normal Normal  ?R. First dorsal interosseous Increased None None None _______ Increased Increased 1+ Reduced  ?R. Biceps brachii Normal None None None _______ Increased Increased 1+ Reduced  ?R. Deltoid Normal None None None _______ Increased Increased 1+ Reduced  ?R. Triceps brachii Normal None None None _______ Increased Increased 1+ Reduced  ?R. Cervical paraspinals Increased None None None _______ Normal Normal Normal Normal  ?L. First dorsal interosseous Normal None None None _______ Normal Normal Normal Reduced  ?L. Pronator teres Normal None None None _______ Normal Normal Normal Reduced  ?L. Biceps brachii Normal None None None _______ Normal Normal Normal Reduced  ?L. Deltoid Normal None None None _______ Normal Normal  Normal Reduced  ?L. Triceps brachii Normal None None None _______ Normal Normal Normal Reduced  ?L. Cervical paraspinals Increased None None None _______ Normal Normal Normal Normal  ? ?  ?

## 2021-11-14 NOTE — Progress Notes (Signed)
? ?No chief complaint on file. ? ? ? ? ?ASSESSMENT AND PLAN ? ?[name] is a 61 y.o. male   ?Peripheral neuropathy, gait abnormality ? Multiple potential course, end-stage renal disease, history of diabetes, heart transplant, medication toxicity, ? Laboratory evaluation showed normal B12, A1c, ? EMG nerve conduction study November 14, 2021 confirmed severe axonal sensorimotor polyneuropathy ? He has hyperreflexia of both upper and patella in the setting of severe peripheral neuropathy, ? MRI cervical spine to rule out cervical spondylitic myelopathy ? ?Neuropathic pain ? Limited choice of medication due to his hemodialysis, now on Lyrica 100 mg daily, managed by nephrologist, ? Add on Trileptal 150 twice a day make mild improvement in his neuropathic pain ? Suggest him seeing pain management ? ? ?DIAGNOSTIC DATA (LABS, IMAGING, TESTING) ?- I reviewed patient records, labs, notes, testing and imaging myself where available. ? ? ?MEDICAL HISTORY: ? ?[name] is a 61 year old male, seen in request by Dr. Madelon Lips, for evaluation of bilateral hands pain, his primary care physician is Dr. Carol Ada, MD  ? ?I reviewed and summarized the referring note. PMHX ?Depression ?A fib,  ?ESRD- since 2016, ?Heart Transplant in 2015, congenital heart disease,  ?Stroke ?Prostate cancer radiation ? ?I also reviewed multiple [facility] record, patient was diagnosed with nonischemic cardiomyopathy in 2009, prior to the diagnosis, he underwent lap band placement lost about 100 pounds of weight, he was type 2 diabetes, he was able to came off his diabetic medication, he had heart transplant in 2014, develop end-stage renal failure as a result of acute kidney injury from heart transplant and calcineurin toxicity, he was started on hemodialysis since December 2014, receiving hemodialysis Monday Wednesday Friday, is on the list for kidney transplant ?  ?Patient reported mild bilateral toes paresthesia since his heart  transplant in 2015, and initiation of hemodialysis, significantly worsened since 2018, within 2 months of time, he noticed quick ascending paresthesia starting from bilateral toes, feet to bilateral calf, knee level, then bilateral fingertips involvement, he described constant pins and needle sensation, despite Lyrica 100 mg 3 times daily treatment, previously tried and failed gabapentin, Cymbalta caused increasing dreams, he has mild unsteady gait because bilateral feet pain, has to look down at his feet to certain his position. ?  ?He is off diabetic medications now, denies bowel and bladder incontinence ? ?He lost to follow-up since initial visit in 2020, today came in complains of slow worsening neuropathic pain involving bilateral hands and feet," my hands and feet are killing me", he complains of difficulty with sleep due to pain, feels like his toes and fingers had frostbite, ? ?He could not sleep at night, so wore out then go to sleep, his hand and feet is killing me, he has to move to ease the pain, previously he has tried gabapentin, not taking Lyrica 100 mg daily, dosage limitation due to end-stage renal disease ? ?He also complains of low back pain, radiating pain to bilateral lower extremity, recently went through epidural injection without significant improvement ? ?Personally reviewed MRI of lumbar April 2022, ? ?1. Severe L4-5 facet arthrosis with trace anterolisthesis and mild ?bilateral neural foraminal stenosis. ?2. Disc degeneration greatest at L5-S1 where there is mild left ?neural foraminal stenosis. ?3. No spinal stenosis. ? ?Update November 14, 2021: ?Patient returns for electrodiagnostic study today, which showed evidence of severe length-dependent axonal peripheral neuropathy, in addition, there is evidence of chronic neuropathic changes involving bilateral lumbosacral myotomes, cervical myotomes, no active denervation  at lumbar paraspinal muscles, but there is evidence of increased  insertional activity at bilateral cervical paraspinal muscles, he continues to have significant neuropathic pain, mild improvement with Trileptal 150 mg twice a day, ? ?Laboratory evaluations in 2023 showed normal B12, A1c, CPK, significantly elevated creatinine 10, ?MRI of lumbar spine again reviewed with patient from April 2021, multilevel degenerative changes, no significant canal stenosis, no evidence of significant foraminal stenosis ? ?On examinations, he has mild distal weakness, length-dependent sensory changes, brisk bilateral upper and lower extremity reflex, absent ankle reflex, he does have chronic neck pain ? ?PHYSICAL EXAM: ?  ? ?PHYSICAL EXAMNIATION: ? ?Gen: NAD, conversant, well nourised, well groomed    ? ?NEUROLOGICAL EXAM: ? ?MENTAL STATUS: ?Speech/cognition: Very depressed looking middle-age male ?Awake, alert, oriented to history taking and casual conversation ?  ?CRANIAL NERVES: ?CN II: Visual fields are full to confrontation. Pupils are round equal and briskly reactive to light. ?CN III, IV, VI: extraocular movement are normal. No ptosis. ?CN V: Facial sensation is intact to light touch ?CN VII: Face is symmetric with normal eye closure  ?CN VIII: Hearing is normal to causal conversation. ?CN IX, X: Phonation is normal. ?CN XI: Head turning and shoulder shrug are intact ? ?MOTOR: Moderate bilateral ankle dorsiflexion weakness, ? ?REFLEXES: ?Reflexes brisk at bilateral upper extremity and patellar, absent at ankles.  Plantar responses are flexor. ? ?SENSORY: Length dependent decreased light touch, pinprick, vibratory sensation to mid shin level ? ?COORDINATION: ?There is no trunk or limb dysmetria noted. ? ?GAIT/STANCE: He needs push-up to get up from seated position, unsteady, bilateral foot drop, bent forward, limited by his body weight, and low back pain as well ? ?REVIEW OF SYSTEMS:  ?Full 14 system review of systems performed and notable only for as above ?All other review of systems were  negative. ? ? ?ALLERGIES: ?Allergies  ?Allergen Reactions  ? Heparin Nausea Only, Swelling and Other (See Comments)  ?   * * HIT * * ?SWELLING REACTION UNSPECIFIED  ?DIAPHORESIS  ? Penicillin G Potassium [Penicillin G] Nausea Only and Other (See Comments)  ?  DIAPHORESIS  ? ? ?HOME MEDICATIONS: ?Current Outpatient Medications  ?Medication Sig Dispense Refill  ? B Complex-C-Folic Acid ([place] CAPS) 1 MG CAPS Take 1 mg by mouth daily.  3  ? cinacalcet (SENSIPAR) 60 MG tablet Take 60 mg by mouth daily.    ? citalopram (CELEXA) 20 MG tablet Take 20 mg by mouth daily.    ? cycloSPORINE modified (NEORAL) 100 MG capsule Take 100 mg by mouth See admin instructions. Take with three 25 mg  (75 ) for a total of 175 mg twice a day    ? cycloSPORINE modified (NEORAL) 25 MG capsule Take 75 mg by mouth See admin instructions. Take With 100 mg tab for a total of 175 mg twice a day    ? ELIQUIS 2.5 MG TABS tablet Take 1 tablet (2.5 mg total) by mouth 2 (two) times daily. 60 tablet   ? lidocaine (XYLOCAINE) 5 % ointment Apply 1 application topically as needed. 35.44 g 0  ? lidocaine-prilocaine (EMLA) cream Apply 1 application topically as needed.    ? midodrine (PROAMATINE) 10 MG tablet Take 1 tablet (10 mg total) by mouth 3 (three) times daily.    ? mycophenolate (MYFORTIC) 180 MG EC tablet Take 720 mg by mouth 2 (two) times daily.    ? ondansetron (ZOFRAN) 4 MG tablet Take 1 tablet (4 mg total) by mouth every 6 (six)  hours as needed for nausea. 20 tablet 0  ? OXcarbazepine (TRILEPTAL) 150 MG tablet Take 1 tablet (150 mg total) by mouth 2 (two) times daily. 60 tablet 11  ? oxybutynin (DITROPAN) 5 MG tablet Take 1 tablet (5 mg total) by mouth every 8 (eight) hours as needed for bladder spasms. 10 tablet 0  ? pregabalin (LYRICA) 25 MG capsule TAKE 5 CAPSULES BY MOUTH AFTER DIALYSIS 60 capsule 1  ? pregabalin (LYRICA) 75 MG capsule TAKE 1 CAPSULE(75 MG) BY MOUTH DAILY (Patient taking differently: Take 100 mg by mouth daily.) 30 capsule 0   ? sevelamer carbonate (RENVELA) 800 MG tablet Take 2 tablets (1,600 mg total) by mouth 3 (three) times daily with meals. 120 tablet 0  ? ?No current facility-administered medications for this visit.  ? ? ?PAST MEDIC

## 2021-11-15 ENCOUNTER — Telehealth: Payer: Self-pay | Admitting: Neurology

## 2021-11-15 DIAGNOSIS — D509 Iron deficiency anemia, unspecified: Secondary | ICD-10-CM | POA: Diagnosis not present

## 2021-11-15 DIAGNOSIS — N2581 Secondary hyperparathyroidism of renal origin: Secondary | ICD-10-CM | POA: Diagnosis not present

## 2021-11-15 DIAGNOSIS — Z992 Dependence on renal dialysis: Secondary | ICD-10-CM | POA: Diagnosis not present

## 2021-11-15 DIAGNOSIS — D631 Anemia in chronic kidney disease: Secondary | ICD-10-CM | POA: Diagnosis not present

## 2021-11-15 DIAGNOSIS — E1129 Type 2 diabetes mellitus with other diabetic kidney complication: Secondary | ICD-10-CM | POA: Diagnosis not present

## 2021-11-15 DIAGNOSIS — N186 End stage renal disease: Secondary | ICD-10-CM | POA: Diagnosis not present

## 2021-11-15 NOTE — Telephone Encounter (Signed)
Medicare/[employer] auth: [employer] via [facility] website order sent to [facility], they will reach out to the patient to schedule.  ?

## 2021-11-16 ENCOUNTER — Other Ambulatory Visit: Payer: Self-pay | Admitting: Neurology

## 2021-11-16 DIAGNOSIS — G63 Polyneuropathy in diseases classified elsewhere: Secondary | ICD-10-CM

## 2021-11-16 DIAGNOSIS — R269 Unspecified abnormalities of gait and mobility: Secondary | ICD-10-CM

## 2021-11-16 DIAGNOSIS — M792 Neuralgia and neuritis, unspecified: Secondary | ICD-10-CM

## 2021-11-16 DIAGNOSIS — N185 Chronic kidney disease, stage 5: Secondary | ICD-10-CM

## 2021-11-17 DIAGNOSIS — Z992 Dependence on renal dialysis: Secondary | ICD-10-CM | POA: Diagnosis not present

## 2021-11-17 DIAGNOSIS — D631 Anemia in chronic kidney disease: Secondary | ICD-10-CM | POA: Diagnosis not present

## 2021-11-17 DIAGNOSIS — E1129 Type 2 diabetes mellitus with other diabetic kidney complication: Secondary | ICD-10-CM | POA: Diagnosis not present

## 2021-11-17 DIAGNOSIS — N186 End stage renal disease: Secondary | ICD-10-CM | POA: Diagnosis not present

## 2021-11-17 DIAGNOSIS — N2581 Secondary hyperparathyroidism of renal origin: Secondary | ICD-10-CM | POA: Diagnosis not present

## 2021-11-17 DIAGNOSIS — D509 Iron deficiency anemia, unspecified: Secondary | ICD-10-CM | POA: Diagnosis not present

## 2021-11-20 DIAGNOSIS — N2581 Secondary hyperparathyroidism of renal origin: Secondary | ICD-10-CM | POA: Diagnosis not present

## 2021-11-20 DIAGNOSIS — Z992 Dependence on renal dialysis: Secondary | ICD-10-CM | POA: Diagnosis not present

## 2021-11-20 DIAGNOSIS — D509 Iron deficiency anemia, unspecified: Secondary | ICD-10-CM | POA: Diagnosis not present

## 2021-11-20 DIAGNOSIS — D631 Anemia in chronic kidney disease: Secondary | ICD-10-CM | POA: Diagnosis not present

## 2021-11-20 DIAGNOSIS — N186 End stage renal disease: Secondary | ICD-10-CM | POA: Diagnosis not present

## 2021-11-20 DIAGNOSIS — E1129 Type 2 diabetes mellitus with other diabetic kidney complication: Secondary | ICD-10-CM | POA: Diagnosis not present

## 2021-11-22 DIAGNOSIS — N2581 Secondary hyperparathyroidism of renal origin: Secondary | ICD-10-CM | POA: Diagnosis not present

## 2021-11-22 DIAGNOSIS — D631 Anemia in chronic kidney disease: Secondary | ICD-10-CM | POA: Diagnosis not present

## 2021-11-22 DIAGNOSIS — D509 Iron deficiency anemia, unspecified: Secondary | ICD-10-CM | POA: Diagnosis not present

## 2021-11-22 DIAGNOSIS — N186 End stage renal disease: Secondary | ICD-10-CM | POA: Diagnosis not present

## 2021-11-22 DIAGNOSIS — E1129 Type 2 diabetes mellitus with other diabetic kidney complication: Secondary | ICD-10-CM | POA: Diagnosis not present

## 2021-11-22 DIAGNOSIS — Z992 Dependence on renal dialysis: Secondary | ICD-10-CM | POA: Diagnosis not present

## 2021-11-24 DIAGNOSIS — N186 End stage renal disease: Secondary | ICD-10-CM | POA: Diagnosis not present

## 2021-11-24 DIAGNOSIS — D509 Iron deficiency anemia, unspecified: Secondary | ICD-10-CM | POA: Diagnosis not present

## 2021-11-24 DIAGNOSIS — Z992 Dependence on renal dialysis: Secondary | ICD-10-CM | POA: Diagnosis not present

## 2021-11-24 DIAGNOSIS — N2581 Secondary hyperparathyroidism of renal origin: Secondary | ICD-10-CM | POA: Diagnosis not present

## 2021-11-24 DIAGNOSIS — D631 Anemia in chronic kidney disease: Secondary | ICD-10-CM | POA: Diagnosis not present

## 2021-11-24 DIAGNOSIS — E1129 Type 2 diabetes mellitus with other diabetic kidney complication: Secondary | ICD-10-CM | POA: Diagnosis not present

## 2021-11-25 DIAGNOSIS — N186 End stage renal disease: Secondary | ICD-10-CM | POA: Diagnosis not present

## 2021-11-25 DIAGNOSIS — Z992 Dependence on renal dialysis: Secondary | ICD-10-CM | POA: Diagnosis not present

## 2021-11-25 DIAGNOSIS — T8620 Unspecified complication of heart transplant: Secondary | ICD-10-CM | POA: Diagnosis not present

## 2021-11-27 ENCOUNTER — Ambulatory Visit
Admission: RE | Admit: 2021-11-27 | Discharge: 2021-11-27 | Disposition: A | Discharge status: Home / Self Care | Payer: Medicare Other | Source: Ambulatory Visit | Attending: Neurology | Admitting: Neurology

## 2021-11-27 DIAGNOSIS — N186 End stage renal disease: Secondary | ICD-10-CM | POA: Diagnosis not present

## 2021-11-27 DIAGNOSIS — M792 Neuralgia and neuritis, unspecified: Secondary | ICD-10-CM

## 2021-11-27 DIAGNOSIS — E1129 Type 2 diabetes mellitus with other diabetic kidney complication: Secondary | ICD-10-CM | POA: Diagnosis not present

## 2021-11-27 DIAGNOSIS — R269 Unspecified abnormalities of gait and mobility: Secondary | ICD-10-CM | POA: Diagnosis not present

## 2021-11-27 DIAGNOSIS — G63 Polyneuropathy in diseases classified elsewhere: Secondary | ICD-10-CM | POA: Diagnosis not present

## 2021-11-27 DIAGNOSIS — D631 Anemia in chronic kidney disease: Secondary | ICD-10-CM | POA: Diagnosis not present

## 2021-11-27 DIAGNOSIS — N2581 Secondary hyperparathyroidism of renal origin: Secondary | ICD-10-CM | POA: Diagnosis not present

## 2021-11-27 DIAGNOSIS — Z992 Dependence on renal dialysis: Secondary | ICD-10-CM | POA: Diagnosis not present

## 2021-11-27 DIAGNOSIS — D509 Iron deficiency anemia, unspecified: Secondary | ICD-10-CM | POA: Diagnosis not present

## 2021-11-27 DIAGNOSIS — N185 Chronic kidney disease, stage 5: Secondary | ICD-10-CM

## 2021-11-28 ENCOUNTER — Telehealth: Payer: Self-pay | Admitting: Neurology

## 2021-11-28 ENCOUNTER — Other Ambulatory Visit: Payer: Medicare Other

## 2021-11-28 NOTE — Telephone Encounter (Signed)
Patient MRI of the cervical spine showed multilevel degenerative disease, most obvious at C2, 3, C3-4, there is evidence of moderate canal stenosis, but no cord signal abnormality, variable degree of foraminal narrowing. ?With his multiple medical history, I do not think he is a good surgical candidate, continue conservative treatment ? ? ? ?IMPRESSION: This MRI of the cervical spine without contrast shows the following: ?1.   The spinal cord appears normal. ?2.   Severe loss of disc height associated with endplate degenerative changes at C2-C3 through C6-C7.  There is also 2 mm anterolisthesis at C2-C3. ?3.   At C2-C3, there is moderate spinal stenosis (AP diameter 6.7 mm) and mild bilateral foraminal narrowing but no nerve root compression. ?4.   At C3-C4, there is moderate spinal stenosis (AP diameter 7.8 mm) and right greater than left foraminal narrowing with potential to compress the right C4 nerve root ?5.   At C4-C5 and C5-C6, there is mild spinal stenosis and mild foraminal narrowing but no nerve root compression. ?6.   At C6-C7, there is mild to moderate left foraminal narrowing but no spinal stenosis or nerve root compression. ?  ?  ?

## 2021-11-28 NOTE — Telephone Encounter (Signed)
I called patient to discuss. No answer, left a message asking him to call me back. If patient calls back another day please route to POD 2. ?

## 2021-11-28 NOTE — Telephone Encounter (Signed)
Patient returned my call. I discussed his MRI results and recommendations. Patient reports that he has not heard from the pain management referral. I will check into this. Pt verbalized understanding of results. Pt had no questions at this time but was encouraged to call back if questions arise. ? ?Raquel Sarna & Aubrey--Can you check into the pain management referral placed 10/12/2021? ?

## 2021-11-29 DIAGNOSIS — Z992 Dependence on renal dialysis: Secondary | ICD-10-CM | POA: Diagnosis not present

## 2021-11-29 DIAGNOSIS — D509 Iron deficiency anemia, unspecified: Secondary | ICD-10-CM | POA: Diagnosis not present

## 2021-11-29 DIAGNOSIS — N186 End stage renal disease: Secondary | ICD-10-CM | POA: Diagnosis not present

## 2021-11-29 DIAGNOSIS — D631 Anemia in chronic kidney disease: Secondary | ICD-10-CM | POA: Diagnosis not present

## 2021-11-29 DIAGNOSIS — E1129 Type 2 diabetes mellitus with other diabetic kidney complication: Secondary | ICD-10-CM | POA: Diagnosis not present

## 2021-11-29 DIAGNOSIS — N2581 Secondary hyperparathyroidism of renal origin: Secondary | ICD-10-CM | POA: Diagnosis not present

## 2021-12-01 DIAGNOSIS — D509 Iron deficiency anemia, unspecified: Secondary | ICD-10-CM | POA: Diagnosis not present

## 2021-12-01 DIAGNOSIS — N2581 Secondary hyperparathyroidism of renal origin: Secondary | ICD-10-CM | POA: Diagnosis not present

## 2021-12-01 DIAGNOSIS — N186 End stage renal disease: Secondary | ICD-10-CM | POA: Diagnosis not present

## 2021-12-01 DIAGNOSIS — D631 Anemia in chronic kidney disease: Secondary | ICD-10-CM | POA: Diagnosis not present

## 2021-12-01 DIAGNOSIS — Z992 Dependence on renal dialysis: Secondary | ICD-10-CM | POA: Diagnosis not present

## 2021-12-01 DIAGNOSIS — E1129 Type 2 diabetes mellitus with other diabetic kidney complication: Secondary | ICD-10-CM | POA: Diagnosis not present

## 2021-12-05 DIAGNOSIS — Z992 Dependence on renal dialysis: Secondary | ICD-10-CM | POA: Diagnosis not present

## 2021-12-05 DIAGNOSIS — E1129 Type 2 diabetes mellitus with other diabetic kidney complication: Secondary | ICD-10-CM | POA: Diagnosis not present

## 2021-12-05 DIAGNOSIS — D631 Anemia in chronic kidney disease: Secondary | ICD-10-CM | POA: Diagnosis not present

## 2021-12-05 DIAGNOSIS — N186 End stage renal disease: Secondary | ICD-10-CM | POA: Diagnosis not present

## 2021-12-05 DIAGNOSIS — D509 Iron deficiency anemia, unspecified: Secondary | ICD-10-CM | POA: Diagnosis not present

## 2021-12-05 DIAGNOSIS — N2581 Secondary hyperparathyroidism of renal origin: Secondary | ICD-10-CM | POA: Diagnosis not present

## 2021-12-06 DIAGNOSIS — Z992 Dependence on renal dialysis: Secondary | ICD-10-CM | POA: Diagnosis not present

## 2021-12-06 DIAGNOSIS — D631 Anemia in chronic kidney disease: Secondary | ICD-10-CM | POA: Diagnosis not present

## 2021-12-06 DIAGNOSIS — N186 End stage renal disease: Secondary | ICD-10-CM | POA: Diagnosis not present

## 2021-12-06 DIAGNOSIS — N2581 Secondary hyperparathyroidism of renal origin: Secondary | ICD-10-CM | POA: Diagnosis not present

## 2021-12-06 DIAGNOSIS — D509 Iron deficiency anemia, unspecified: Secondary | ICD-10-CM | POA: Diagnosis not present

## 2021-12-06 DIAGNOSIS — E1129 Type 2 diabetes mellitus with other diabetic kidney complication: Secondary | ICD-10-CM | POA: Diagnosis not present

## 2021-12-08 DIAGNOSIS — N2581 Secondary hyperparathyroidism of renal origin: Secondary | ICD-10-CM | POA: Diagnosis not present

## 2021-12-08 DIAGNOSIS — E1129 Type 2 diabetes mellitus with other diabetic kidney complication: Secondary | ICD-10-CM | POA: Diagnosis not present

## 2021-12-08 DIAGNOSIS — Z992 Dependence on renal dialysis: Secondary | ICD-10-CM | POA: Diagnosis not present

## 2021-12-08 DIAGNOSIS — N186 End stage renal disease: Secondary | ICD-10-CM | POA: Diagnosis not present

## 2021-12-08 DIAGNOSIS — D631 Anemia in chronic kidney disease: Secondary | ICD-10-CM | POA: Diagnosis not present

## 2021-12-08 DIAGNOSIS — D509 Iron deficiency anemia, unspecified: Secondary | ICD-10-CM | POA: Diagnosis not present

## 2021-12-11 DIAGNOSIS — Z992 Dependence on renal dialysis: Secondary | ICD-10-CM | POA: Diagnosis not present

## 2021-12-11 DIAGNOSIS — E1129 Type 2 diabetes mellitus with other diabetic kidney complication: Secondary | ICD-10-CM | POA: Diagnosis not present

## 2021-12-11 DIAGNOSIS — D509 Iron deficiency anemia, unspecified: Secondary | ICD-10-CM | POA: Diagnosis not present

## 2021-12-11 DIAGNOSIS — D631 Anemia in chronic kidney disease: Secondary | ICD-10-CM | POA: Diagnosis not present

## 2021-12-11 DIAGNOSIS — N186 End stage renal disease: Secondary | ICD-10-CM | POA: Diagnosis not present

## 2021-12-11 DIAGNOSIS — N2581 Secondary hyperparathyroidism of renal origin: Secondary | ICD-10-CM | POA: Diagnosis not present

## 2021-12-13 DIAGNOSIS — D509 Iron deficiency anemia, unspecified: Secondary | ICD-10-CM | POA: Diagnosis not present

## 2021-12-13 DIAGNOSIS — Z992 Dependence on renal dialysis: Secondary | ICD-10-CM | POA: Diagnosis not present

## 2021-12-13 DIAGNOSIS — E1129 Type 2 diabetes mellitus with other diabetic kidney complication: Secondary | ICD-10-CM | POA: Diagnosis not present

## 2021-12-13 DIAGNOSIS — D631 Anemia in chronic kidney disease: Secondary | ICD-10-CM | POA: Diagnosis not present

## 2021-12-13 DIAGNOSIS — N186 End stage renal disease: Secondary | ICD-10-CM | POA: Diagnosis not present

## 2021-12-13 DIAGNOSIS — N2581 Secondary hyperparathyroidism of renal origin: Secondary | ICD-10-CM | POA: Diagnosis not present

## 2021-12-15 DIAGNOSIS — D509 Iron deficiency anemia, unspecified: Secondary | ICD-10-CM | POA: Diagnosis not present

## 2021-12-15 DIAGNOSIS — Z992 Dependence on renal dialysis: Secondary | ICD-10-CM | POA: Diagnosis not present

## 2021-12-15 DIAGNOSIS — D631 Anemia in chronic kidney disease: Secondary | ICD-10-CM | POA: Diagnosis not present

## 2021-12-15 DIAGNOSIS — N186 End stage renal disease: Secondary | ICD-10-CM | POA: Diagnosis not present

## 2021-12-15 DIAGNOSIS — E1129 Type 2 diabetes mellitus with other diabetic kidney complication: Secondary | ICD-10-CM | POA: Diagnosis not present

## 2021-12-15 DIAGNOSIS — N2581 Secondary hyperparathyroidism of renal origin: Secondary | ICD-10-CM | POA: Diagnosis not present

## 2021-12-18 DIAGNOSIS — N186 End stage renal disease: Secondary | ICD-10-CM | POA: Diagnosis not present

## 2021-12-18 DIAGNOSIS — Z992 Dependence on renal dialysis: Secondary | ICD-10-CM | POA: Diagnosis not present

## 2021-12-20 DIAGNOSIS — N186 End stage renal disease: Secondary | ICD-10-CM | POA: Diagnosis not present

## 2021-12-20 DIAGNOSIS — Z992 Dependence on renal dialysis: Secondary | ICD-10-CM | POA: Diagnosis not present

## 2021-12-21 DIAGNOSIS — M792 Neuralgia and neuritis, unspecified: Secondary | ICD-10-CM | POA: Diagnosis not present

## 2021-12-22 DIAGNOSIS — D631 Anemia in chronic kidney disease: Secondary | ICD-10-CM | POA: Diagnosis not present

## 2021-12-22 DIAGNOSIS — D509 Iron deficiency anemia, unspecified: Secondary | ICD-10-CM | POA: Diagnosis not present

## 2021-12-22 DIAGNOSIS — Z992 Dependence on renal dialysis: Secondary | ICD-10-CM | POA: Diagnosis not present

## 2021-12-22 DIAGNOSIS — N186 End stage renal disease: Secondary | ICD-10-CM | POA: Diagnosis not present

## 2021-12-22 DIAGNOSIS — N2581 Secondary hyperparathyroidism of renal origin: Secondary | ICD-10-CM | POA: Diagnosis not present

## 2021-12-22 DIAGNOSIS — E1129 Type 2 diabetes mellitus with other diabetic kidney complication: Secondary | ICD-10-CM | POA: Diagnosis not present

## 2021-12-25 DIAGNOSIS — T8620 Unspecified complication of heart transplant: Secondary | ICD-10-CM | POA: Diagnosis not present

## 2021-12-25 DIAGNOSIS — Z992 Dependence on renal dialysis: Secondary | ICD-10-CM | POA: Diagnosis not present

## 2021-12-25 DIAGNOSIS — N186 End stage renal disease: Secondary | ICD-10-CM | POA: Diagnosis not present

## 2021-12-25 DIAGNOSIS — E1129 Type 2 diabetes mellitus with other diabetic kidney complication: Secondary | ICD-10-CM | POA: Diagnosis not present

## 2021-12-25 DIAGNOSIS — N2581 Secondary hyperparathyroidism of renal origin: Secondary | ICD-10-CM | POA: Diagnosis not present

## 2021-12-25 DIAGNOSIS — D631 Anemia in chronic kidney disease: Secondary | ICD-10-CM | POA: Diagnosis not present

## 2021-12-25 DIAGNOSIS — D509 Iron deficiency anemia, unspecified: Secondary | ICD-10-CM | POA: Diagnosis not present

## 2021-12-27 DIAGNOSIS — E1129 Type 2 diabetes mellitus with other diabetic kidney complication: Secondary | ICD-10-CM | POA: Diagnosis not present

## 2021-12-27 DIAGNOSIS — N186 End stage renal disease: Secondary | ICD-10-CM | POA: Diagnosis not present

## 2021-12-27 DIAGNOSIS — N2581 Secondary hyperparathyroidism of renal origin: Secondary | ICD-10-CM | POA: Diagnosis not present

## 2021-12-27 DIAGNOSIS — Z992 Dependence on renal dialysis: Secondary | ICD-10-CM | POA: Diagnosis not present

## 2021-12-27 DIAGNOSIS — D509 Iron deficiency anemia, unspecified: Secondary | ICD-10-CM | POA: Diagnosis not present

## 2021-12-27 DIAGNOSIS — D631 Anemia in chronic kidney disease: Secondary | ICD-10-CM | POA: Diagnosis not present

## 2021-12-29 DIAGNOSIS — Z992 Dependence on renal dialysis: Secondary | ICD-10-CM | POA: Diagnosis not present

## 2021-12-29 DIAGNOSIS — N186 End stage renal disease: Secondary | ICD-10-CM | POA: Diagnosis not present

## 2021-12-29 DIAGNOSIS — E1129 Type 2 diabetes mellitus with other diabetic kidney complication: Secondary | ICD-10-CM | POA: Diagnosis not present

## 2021-12-29 DIAGNOSIS — D631 Anemia in chronic kidney disease: Secondary | ICD-10-CM | POA: Diagnosis not present

## 2021-12-29 DIAGNOSIS — D509 Iron deficiency anemia, unspecified: Secondary | ICD-10-CM | POA: Diagnosis not present

## 2021-12-29 DIAGNOSIS — N2581 Secondary hyperparathyroidism of renal origin: Secondary | ICD-10-CM | POA: Diagnosis not present

## 2022-01-01 DIAGNOSIS — N2581 Secondary hyperparathyroidism of renal origin: Secondary | ICD-10-CM | POA: Diagnosis not present

## 2022-01-01 DIAGNOSIS — E1129 Type 2 diabetes mellitus with other diabetic kidney complication: Secondary | ICD-10-CM | POA: Diagnosis not present

## 2022-01-01 DIAGNOSIS — D631 Anemia in chronic kidney disease: Secondary | ICD-10-CM | POA: Diagnosis not present

## 2022-01-01 DIAGNOSIS — Z992 Dependence on renal dialysis: Secondary | ICD-10-CM | POA: Diagnosis not present

## 2022-01-01 DIAGNOSIS — N186 End stage renal disease: Secondary | ICD-10-CM | POA: Diagnosis not present

## 2022-01-01 DIAGNOSIS — D509 Iron deficiency anemia, unspecified: Secondary | ICD-10-CM | POA: Diagnosis not present

## 2022-01-02 DIAGNOSIS — M5416 Radiculopathy, lumbar region: Secondary | ICD-10-CM | POA: Diagnosis not present

## 2022-01-03 DIAGNOSIS — D509 Iron deficiency anemia, unspecified: Secondary | ICD-10-CM | POA: Diagnosis not present

## 2022-01-03 DIAGNOSIS — N2581 Secondary hyperparathyroidism of renal origin: Secondary | ICD-10-CM | POA: Diagnosis not present

## 2022-01-03 DIAGNOSIS — E1129 Type 2 diabetes mellitus with other diabetic kidney complication: Secondary | ICD-10-CM | POA: Diagnosis not present

## 2022-01-03 DIAGNOSIS — D631 Anemia in chronic kidney disease: Secondary | ICD-10-CM | POA: Diagnosis not present

## 2022-01-03 DIAGNOSIS — Z992 Dependence on renal dialysis: Secondary | ICD-10-CM | POA: Diagnosis not present

## 2022-01-03 DIAGNOSIS — N186 End stage renal disease: Secondary | ICD-10-CM | POA: Diagnosis not present

## 2022-01-05 DIAGNOSIS — D631 Anemia in chronic kidney disease: Secondary | ICD-10-CM | POA: Diagnosis not present

## 2022-01-05 DIAGNOSIS — D509 Iron deficiency anemia, unspecified: Secondary | ICD-10-CM | POA: Diagnosis not present

## 2022-01-05 DIAGNOSIS — E1129 Type 2 diabetes mellitus with other diabetic kidney complication: Secondary | ICD-10-CM | POA: Diagnosis not present

## 2022-01-05 DIAGNOSIS — N2581 Secondary hyperparathyroidism of renal origin: Secondary | ICD-10-CM | POA: Diagnosis not present

## 2022-01-05 DIAGNOSIS — N186 End stage renal disease: Secondary | ICD-10-CM | POA: Diagnosis not present

## 2022-01-05 DIAGNOSIS — Z992 Dependence on renal dialysis: Secondary | ICD-10-CM | POA: Diagnosis not present

## 2022-01-06 DIAGNOSIS — M5416 Radiculopathy, lumbar region: Secondary | ICD-10-CM | POA: Diagnosis not present

## 2022-01-08 DIAGNOSIS — N2581 Secondary hyperparathyroidism of renal origin: Secondary | ICD-10-CM | POA: Diagnosis not present

## 2022-01-08 DIAGNOSIS — E1129 Type 2 diabetes mellitus with other diabetic kidney complication: Secondary | ICD-10-CM | POA: Diagnosis not present

## 2022-01-08 DIAGNOSIS — D509 Iron deficiency anemia, unspecified: Secondary | ICD-10-CM | POA: Diagnosis not present

## 2022-01-08 DIAGNOSIS — N186 End stage renal disease: Secondary | ICD-10-CM | POA: Diagnosis not present

## 2022-01-08 DIAGNOSIS — D631 Anemia in chronic kidney disease: Secondary | ICD-10-CM | POA: Diagnosis not present

## 2022-01-08 DIAGNOSIS — Z992 Dependence on renal dialysis: Secondary | ICD-10-CM | POA: Diagnosis not present

## 2022-01-10 DIAGNOSIS — E1129 Type 2 diabetes mellitus with other diabetic kidney complication: Secondary | ICD-10-CM | POA: Diagnosis not present

## 2022-01-10 DIAGNOSIS — Z992 Dependence on renal dialysis: Secondary | ICD-10-CM | POA: Diagnosis not present

## 2022-01-10 DIAGNOSIS — N186 End stage renal disease: Secondary | ICD-10-CM | POA: Diagnosis not present

## 2022-01-10 DIAGNOSIS — N2581 Secondary hyperparathyroidism of renal origin: Secondary | ICD-10-CM | POA: Diagnosis not present

## 2022-01-10 DIAGNOSIS — D631 Anemia in chronic kidney disease: Secondary | ICD-10-CM | POA: Diagnosis not present

## 2022-01-10 DIAGNOSIS — D509 Iron deficiency anemia, unspecified: Secondary | ICD-10-CM | POA: Diagnosis not present

## 2022-01-11 DIAGNOSIS — E669 Obesity, unspecified: Secondary | ICD-10-CM | POA: Diagnosis not present

## 2022-01-11 DIAGNOSIS — M4802 Spinal stenosis, cervical region: Secondary | ICD-10-CM | POA: Diagnosis not present

## 2022-01-11 DIAGNOSIS — Z79899 Other long term (current) drug therapy: Secondary | ICD-10-CM | POA: Diagnosis not present

## 2022-01-11 DIAGNOSIS — G629 Polyneuropathy, unspecified: Secondary | ICD-10-CM | POA: Diagnosis not present

## 2022-01-11 DIAGNOSIS — Z6834 Body mass index (BMI) 34.0-34.9, adult: Secondary | ICD-10-CM | POA: Diagnosis not present

## 2022-01-11 DIAGNOSIS — M5030 Other cervical disc degeneration, unspecified cervical region: Secondary | ICD-10-CM | POA: Diagnosis not present

## 2022-01-11 DIAGNOSIS — Z0130 Encounter for examination of blood pressure without abnormal findings: Secondary | ICD-10-CM | POA: Diagnosis not present

## 2022-01-12 DIAGNOSIS — E1129 Type 2 diabetes mellitus with other diabetic kidney complication: Secondary | ICD-10-CM | POA: Diagnosis not present

## 2022-01-12 DIAGNOSIS — D509 Iron deficiency anemia, unspecified: Secondary | ICD-10-CM | POA: Diagnosis not present

## 2022-01-12 DIAGNOSIS — N186 End stage renal disease: Secondary | ICD-10-CM | POA: Diagnosis not present

## 2022-01-12 DIAGNOSIS — Z992 Dependence on renal dialysis: Secondary | ICD-10-CM | POA: Diagnosis not present

## 2022-01-12 DIAGNOSIS — D631 Anemia in chronic kidney disease: Secondary | ICD-10-CM | POA: Diagnosis not present

## 2022-01-12 DIAGNOSIS — N2581 Secondary hyperparathyroidism of renal origin: Secondary | ICD-10-CM | POA: Diagnosis not present

## 2022-01-15 DIAGNOSIS — D509 Iron deficiency anemia, unspecified: Secondary | ICD-10-CM | POA: Diagnosis not present

## 2022-01-15 DIAGNOSIS — E1129 Type 2 diabetes mellitus with other diabetic kidney complication: Secondary | ICD-10-CM | POA: Diagnosis not present

## 2022-01-15 DIAGNOSIS — Z992 Dependence on renal dialysis: Secondary | ICD-10-CM | POA: Diagnosis not present

## 2022-01-15 DIAGNOSIS — N2581 Secondary hyperparathyroidism of renal origin: Secondary | ICD-10-CM | POA: Diagnosis not present

## 2022-01-15 DIAGNOSIS — N186 End stage renal disease: Secondary | ICD-10-CM | POA: Diagnosis not present

## 2022-01-15 DIAGNOSIS — D631 Anemia in chronic kidney disease: Secondary | ICD-10-CM | POA: Diagnosis not present

## 2022-01-17 DIAGNOSIS — D631 Anemia in chronic kidney disease: Secondary | ICD-10-CM | POA: Diagnosis not present

## 2022-01-17 DIAGNOSIS — E1129 Type 2 diabetes mellitus with other diabetic kidney complication: Secondary | ICD-10-CM | POA: Diagnosis not present

## 2022-01-17 DIAGNOSIS — N186 End stage renal disease: Secondary | ICD-10-CM | POA: Diagnosis not present

## 2022-01-17 DIAGNOSIS — N2581 Secondary hyperparathyroidism of renal origin: Secondary | ICD-10-CM | POA: Diagnosis not present

## 2022-01-17 DIAGNOSIS — Z992 Dependence on renal dialysis: Secondary | ICD-10-CM | POA: Diagnosis not present

## 2022-01-17 DIAGNOSIS — D509 Iron deficiency anemia, unspecified: Secondary | ICD-10-CM | POA: Diagnosis not present

## 2022-01-18 DIAGNOSIS — Z0130 Encounter for examination of blood pressure without abnormal findings: Secondary | ICD-10-CM | POA: Diagnosis not present

## 2022-01-18 DIAGNOSIS — G629 Polyneuropathy, unspecified: Secondary | ICD-10-CM | POA: Diagnosis not present

## 2022-01-18 DIAGNOSIS — E669 Obesity, unspecified: Secondary | ICD-10-CM | POA: Diagnosis not present

## 2022-01-18 DIAGNOSIS — D508 Other iron deficiency anemias: Secondary | ICD-10-CM | POA: Diagnosis not present

## 2022-01-18 DIAGNOSIS — Z6834 Body mass index (BMI) 34.0-34.9, adult: Secondary | ICD-10-CM | POA: Diagnosis not present

## 2022-01-18 DIAGNOSIS — Z79899 Other long term (current) drug therapy: Secondary | ICD-10-CM | POA: Diagnosis not present

## 2022-01-18 DIAGNOSIS — D539 Nutritional anemia, unspecified: Secondary | ICD-10-CM | POA: Diagnosis not present

## 2022-01-19 DIAGNOSIS — D631 Anemia in chronic kidney disease: Secondary | ICD-10-CM | POA: Diagnosis not present

## 2022-01-19 DIAGNOSIS — N186 End stage renal disease: Secondary | ICD-10-CM | POA: Diagnosis not present

## 2022-01-19 DIAGNOSIS — D509 Iron deficiency anemia, unspecified: Secondary | ICD-10-CM | POA: Diagnosis not present

## 2022-01-19 DIAGNOSIS — N2581 Secondary hyperparathyroidism of renal origin: Secondary | ICD-10-CM | POA: Diagnosis not present

## 2022-01-19 DIAGNOSIS — Z992 Dependence on renal dialysis: Secondary | ICD-10-CM | POA: Diagnosis not present

## 2022-01-19 DIAGNOSIS — E1129 Type 2 diabetes mellitus with other diabetic kidney complication: Secondary | ICD-10-CM | POA: Diagnosis not present

## 2022-01-22 DIAGNOSIS — Z992 Dependence on renal dialysis: Secondary | ICD-10-CM | POA: Diagnosis not present

## 2022-01-22 DIAGNOSIS — D631 Anemia in chronic kidney disease: Secondary | ICD-10-CM | POA: Diagnosis not present

## 2022-01-22 DIAGNOSIS — E1129 Type 2 diabetes mellitus with other diabetic kidney complication: Secondary | ICD-10-CM | POA: Diagnosis not present

## 2022-01-22 DIAGNOSIS — N2581 Secondary hyperparathyroidism of renal origin: Secondary | ICD-10-CM | POA: Diagnosis not present

## 2022-01-22 DIAGNOSIS — N186 End stage renal disease: Secondary | ICD-10-CM | POA: Diagnosis not present

## 2022-01-22 DIAGNOSIS — D509 Iron deficiency anemia, unspecified: Secondary | ICD-10-CM | POA: Diagnosis not present

## 2022-01-24 DIAGNOSIS — E1129 Type 2 diabetes mellitus with other diabetic kidney complication: Secondary | ICD-10-CM | POA: Diagnosis not present

## 2022-01-24 DIAGNOSIS — N186 End stage renal disease: Secondary | ICD-10-CM | POA: Diagnosis not present

## 2022-01-24 DIAGNOSIS — D631 Anemia in chronic kidney disease: Secondary | ICD-10-CM | POA: Diagnosis not present

## 2022-01-24 DIAGNOSIS — N2581 Secondary hyperparathyroidism of renal origin: Secondary | ICD-10-CM | POA: Diagnosis not present

## 2022-01-24 DIAGNOSIS — Z992 Dependence on renal dialysis: Secondary | ICD-10-CM | POA: Diagnosis not present

## 2022-01-24 DIAGNOSIS — D509 Iron deficiency anemia, unspecified: Secondary | ICD-10-CM | POA: Diagnosis not present

## 2022-01-25 DIAGNOSIS — N186 End stage renal disease: Secondary | ICD-10-CM | POA: Diagnosis not present

## 2022-01-25 DIAGNOSIS — Z992 Dependence on renal dialysis: Secondary | ICD-10-CM | POA: Diagnosis not present

## 2022-01-25 DIAGNOSIS — T8620 Unspecified complication of heart transplant: Secondary | ICD-10-CM | POA: Diagnosis not present

## 2022-01-26 DIAGNOSIS — N186 End stage renal disease: Secondary | ICD-10-CM | POA: Diagnosis not present

## 2022-01-26 DIAGNOSIS — D509 Iron deficiency anemia, unspecified: Secondary | ICD-10-CM | POA: Diagnosis not present

## 2022-01-26 DIAGNOSIS — Z992 Dependence on renal dialysis: Secondary | ICD-10-CM | POA: Diagnosis not present

## 2022-01-26 DIAGNOSIS — D631 Anemia in chronic kidney disease: Secondary | ICD-10-CM | POA: Diagnosis not present

## 2022-01-26 DIAGNOSIS — N2581 Secondary hyperparathyroidism of renal origin: Secondary | ICD-10-CM | POA: Diagnosis not present

## 2022-01-26 DIAGNOSIS — E1129 Type 2 diabetes mellitus with other diabetic kidney complication: Secondary | ICD-10-CM | POA: Diagnosis not present

## 2022-01-29 DIAGNOSIS — N2581 Secondary hyperparathyroidism of renal origin: Secondary | ICD-10-CM | POA: Diagnosis not present

## 2022-01-29 DIAGNOSIS — D509 Iron deficiency anemia, unspecified: Secondary | ICD-10-CM | POA: Diagnosis not present

## 2022-01-29 DIAGNOSIS — D631 Anemia in chronic kidney disease: Secondary | ICD-10-CM | POA: Diagnosis not present

## 2022-01-29 DIAGNOSIS — E1129 Type 2 diabetes mellitus with other diabetic kidney complication: Secondary | ICD-10-CM | POA: Diagnosis not present

## 2022-01-29 DIAGNOSIS — Z992 Dependence on renal dialysis: Secondary | ICD-10-CM | POA: Diagnosis not present

## 2022-01-29 DIAGNOSIS — N186 End stage renal disease: Secondary | ICD-10-CM | POA: Diagnosis not present

## 2022-01-31 DIAGNOSIS — E1129 Type 2 diabetes mellitus with other diabetic kidney complication: Secondary | ICD-10-CM | POA: Diagnosis not present

## 2022-01-31 DIAGNOSIS — N186 End stage renal disease: Secondary | ICD-10-CM | POA: Diagnosis not present

## 2022-01-31 DIAGNOSIS — N2581 Secondary hyperparathyroidism of renal origin: Secondary | ICD-10-CM | POA: Diagnosis not present

## 2022-01-31 DIAGNOSIS — D631 Anemia in chronic kidney disease: Secondary | ICD-10-CM | POA: Diagnosis not present

## 2022-01-31 DIAGNOSIS — Z992 Dependence on renal dialysis: Secondary | ICD-10-CM | POA: Diagnosis not present

## 2022-01-31 DIAGNOSIS — D509 Iron deficiency anemia, unspecified: Secondary | ICD-10-CM | POA: Diagnosis not present

## 2022-02-02 DIAGNOSIS — D509 Iron deficiency anemia, unspecified: Secondary | ICD-10-CM | POA: Diagnosis not present

## 2022-02-02 DIAGNOSIS — D631 Anemia in chronic kidney disease: Secondary | ICD-10-CM | POA: Diagnosis not present

## 2022-02-02 DIAGNOSIS — Z992 Dependence on renal dialysis: Secondary | ICD-10-CM | POA: Diagnosis not present

## 2022-02-02 DIAGNOSIS — N186 End stage renal disease: Secondary | ICD-10-CM | POA: Diagnosis not present

## 2022-02-02 DIAGNOSIS — E1129 Type 2 diabetes mellitus with other diabetic kidney complication: Secondary | ICD-10-CM | POA: Diagnosis not present

## 2022-02-02 DIAGNOSIS — N2581 Secondary hyperparathyroidism of renal origin: Secondary | ICD-10-CM | POA: Diagnosis not present

## 2022-02-05 DIAGNOSIS — N186 End stage renal disease: Secondary | ICD-10-CM | POA: Diagnosis not present

## 2022-02-05 DIAGNOSIS — Z992 Dependence on renal dialysis: Secondary | ICD-10-CM | POA: Diagnosis not present

## 2022-02-05 DIAGNOSIS — D631 Anemia in chronic kidney disease: Secondary | ICD-10-CM | POA: Diagnosis not present

## 2022-02-05 DIAGNOSIS — N2581 Secondary hyperparathyroidism of renal origin: Secondary | ICD-10-CM | POA: Diagnosis not present

## 2022-02-05 DIAGNOSIS — D509 Iron deficiency anemia, unspecified: Secondary | ICD-10-CM | POA: Diagnosis not present

## 2022-02-05 DIAGNOSIS — E1129 Type 2 diabetes mellitus with other diabetic kidney complication: Secondary | ICD-10-CM | POA: Diagnosis not present

## 2022-02-07 DIAGNOSIS — N186 End stage renal disease: Secondary | ICD-10-CM | POA: Diagnosis not present

## 2022-02-07 DIAGNOSIS — Z992 Dependence on renal dialysis: Secondary | ICD-10-CM | POA: Diagnosis not present

## 2022-02-07 DIAGNOSIS — D631 Anemia in chronic kidney disease: Secondary | ICD-10-CM | POA: Diagnosis not present

## 2022-02-07 DIAGNOSIS — E1129 Type 2 diabetes mellitus with other diabetic kidney complication: Secondary | ICD-10-CM | POA: Diagnosis not present

## 2022-02-07 DIAGNOSIS — N2581 Secondary hyperparathyroidism of renal origin: Secondary | ICD-10-CM | POA: Diagnosis not present

## 2022-02-07 DIAGNOSIS — D509 Iron deficiency anemia, unspecified: Secondary | ICD-10-CM | POA: Diagnosis not present

## 2022-02-09 DIAGNOSIS — D509 Iron deficiency anemia, unspecified: Secondary | ICD-10-CM | POA: Diagnosis not present

## 2022-02-09 DIAGNOSIS — N2581 Secondary hyperparathyroidism of renal origin: Secondary | ICD-10-CM | POA: Diagnosis not present

## 2022-02-09 DIAGNOSIS — D631 Anemia in chronic kidney disease: Secondary | ICD-10-CM | POA: Diagnosis not present

## 2022-02-09 DIAGNOSIS — Z992 Dependence on renal dialysis: Secondary | ICD-10-CM | POA: Diagnosis not present

## 2022-02-09 DIAGNOSIS — N186 End stage renal disease: Secondary | ICD-10-CM | POA: Diagnosis not present

## 2022-02-09 DIAGNOSIS — E1129 Type 2 diabetes mellitus with other diabetic kidney complication: Secondary | ICD-10-CM | POA: Diagnosis not present

## 2022-02-12 DIAGNOSIS — D509 Iron deficiency anemia, unspecified: Secondary | ICD-10-CM | POA: Diagnosis not present

## 2022-02-12 DIAGNOSIS — E1129 Type 2 diabetes mellitus with other diabetic kidney complication: Secondary | ICD-10-CM | POA: Diagnosis not present

## 2022-02-12 DIAGNOSIS — N2581 Secondary hyperparathyroidism of renal origin: Secondary | ICD-10-CM | POA: Diagnosis not present

## 2022-02-12 DIAGNOSIS — D631 Anemia in chronic kidney disease: Secondary | ICD-10-CM | POA: Diagnosis not present

## 2022-02-12 DIAGNOSIS — Z992 Dependence on renal dialysis: Secondary | ICD-10-CM | POA: Diagnosis not present

## 2022-02-12 DIAGNOSIS — N186 End stage renal disease: Secondary | ICD-10-CM | POA: Diagnosis not present

## 2022-02-14 DIAGNOSIS — E1129 Type 2 diabetes mellitus with other diabetic kidney complication: Secondary | ICD-10-CM | POA: Diagnosis not present

## 2022-02-14 DIAGNOSIS — D631 Anemia in chronic kidney disease: Secondary | ICD-10-CM | POA: Diagnosis not present

## 2022-02-14 DIAGNOSIS — Z992 Dependence on renal dialysis: Secondary | ICD-10-CM | POA: Diagnosis not present

## 2022-02-14 DIAGNOSIS — N2581 Secondary hyperparathyroidism of renal origin: Secondary | ICD-10-CM | POA: Diagnosis not present

## 2022-02-14 DIAGNOSIS — N186 End stage renal disease: Secondary | ICD-10-CM | POA: Diagnosis not present

## 2022-02-14 DIAGNOSIS — D509 Iron deficiency anemia, unspecified: Secondary | ICD-10-CM | POA: Diagnosis not present

## 2022-02-16 DIAGNOSIS — D509 Iron deficiency anemia, unspecified: Secondary | ICD-10-CM | POA: Diagnosis not present

## 2022-02-16 DIAGNOSIS — N186 End stage renal disease: Secondary | ICD-10-CM | POA: Diagnosis not present

## 2022-02-16 DIAGNOSIS — E1129 Type 2 diabetes mellitus with other diabetic kidney complication: Secondary | ICD-10-CM | POA: Diagnosis not present

## 2022-02-16 DIAGNOSIS — D631 Anemia in chronic kidney disease: Secondary | ICD-10-CM | POA: Diagnosis not present

## 2022-02-16 DIAGNOSIS — N2581 Secondary hyperparathyroidism of renal origin: Secondary | ICD-10-CM | POA: Diagnosis not present

## 2022-02-16 DIAGNOSIS — Z992 Dependence on renal dialysis: Secondary | ICD-10-CM | POA: Diagnosis not present

## 2022-02-19 DIAGNOSIS — D509 Iron deficiency anemia, unspecified: Secondary | ICD-10-CM | POA: Diagnosis not present

## 2022-02-19 DIAGNOSIS — E1129 Type 2 diabetes mellitus with other diabetic kidney complication: Secondary | ICD-10-CM | POA: Diagnosis not present

## 2022-02-19 DIAGNOSIS — Z992 Dependence on renal dialysis: Secondary | ICD-10-CM | POA: Diagnosis not present

## 2022-02-19 DIAGNOSIS — D631 Anemia in chronic kidney disease: Secondary | ICD-10-CM | POA: Diagnosis not present

## 2022-02-19 DIAGNOSIS — N186 End stage renal disease: Secondary | ICD-10-CM | POA: Diagnosis not present

## 2022-02-19 DIAGNOSIS — N2581 Secondary hyperparathyroidism of renal origin: Secondary | ICD-10-CM | POA: Diagnosis not present

## 2022-02-21 DIAGNOSIS — D509 Iron deficiency anemia, unspecified: Secondary | ICD-10-CM | POA: Diagnosis not present

## 2022-02-21 DIAGNOSIS — Z992 Dependence on renal dialysis: Secondary | ICD-10-CM | POA: Diagnosis not present

## 2022-02-21 DIAGNOSIS — D631 Anemia in chronic kidney disease: Secondary | ICD-10-CM | POA: Diagnosis not present

## 2022-02-21 DIAGNOSIS — N2581 Secondary hyperparathyroidism of renal origin: Secondary | ICD-10-CM | POA: Diagnosis not present

## 2022-02-21 DIAGNOSIS — E1129 Type 2 diabetes mellitus with other diabetic kidney complication: Secondary | ICD-10-CM | POA: Diagnosis not present

## 2022-02-21 DIAGNOSIS — N186 End stage renal disease: Secondary | ICD-10-CM | POA: Diagnosis not present

## 2022-02-23 DIAGNOSIS — E1129 Type 2 diabetes mellitus with other diabetic kidney complication: Secondary | ICD-10-CM | POA: Diagnosis not present

## 2022-02-23 DIAGNOSIS — D631 Anemia in chronic kidney disease: Secondary | ICD-10-CM | POA: Diagnosis not present

## 2022-02-23 DIAGNOSIS — D509 Iron deficiency anemia, unspecified: Secondary | ICD-10-CM | POA: Diagnosis not present

## 2022-02-23 DIAGNOSIS — N2581 Secondary hyperparathyroidism of renal origin: Secondary | ICD-10-CM | POA: Diagnosis not present

## 2022-02-23 DIAGNOSIS — N186 End stage renal disease: Secondary | ICD-10-CM | POA: Diagnosis not present

## 2022-02-23 DIAGNOSIS — Z992 Dependence on renal dialysis: Secondary | ICD-10-CM | POA: Diagnosis not present

## 2022-02-24 DIAGNOSIS — N186 End stage renal disease: Secondary | ICD-10-CM | POA: Diagnosis not present

## 2022-02-24 DIAGNOSIS — Z992 Dependence on renal dialysis: Secondary | ICD-10-CM | POA: Diagnosis not present

## 2022-02-24 DIAGNOSIS — T8620 Unspecified complication of heart transplant: Secondary | ICD-10-CM | POA: Diagnosis not present

## 2022-02-26 DIAGNOSIS — N186 End stage renal disease: Secondary | ICD-10-CM | POA: Diagnosis not present

## 2022-02-26 DIAGNOSIS — Z992 Dependence on renal dialysis: Secondary | ICD-10-CM | POA: Diagnosis not present

## 2022-02-26 DIAGNOSIS — D509 Iron deficiency anemia, unspecified: Secondary | ICD-10-CM | POA: Diagnosis not present

## 2022-02-26 DIAGNOSIS — N2581 Secondary hyperparathyroidism of renal origin: Secondary | ICD-10-CM | POA: Diagnosis not present

## 2022-02-26 DIAGNOSIS — D631 Anemia in chronic kidney disease: Secondary | ICD-10-CM | POA: Diagnosis not present

## 2022-02-26 DIAGNOSIS — E1129 Type 2 diabetes mellitus with other diabetic kidney complication: Secondary | ICD-10-CM | POA: Diagnosis not present

## 2022-02-28 DIAGNOSIS — D509 Iron deficiency anemia, unspecified: Secondary | ICD-10-CM | POA: Diagnosis not present

## 2022-02-28 DIAGNOSIS — E1129 Type 2 diabetes mellitus with other diabetic kidney complication: Secondary | ICD-10-CM | POA: Diagnosis not present

## 2022-02-28 DIAGNOSIS — N186 End stage renal disease: Secondary | ICD-10-CM | POA: Diagnosis not present

## 2022-02-28 DIAGNOSIS — N2581 Secondary hyperparathyroidism of renal origin: Secondary | ICD-10-CM | POA: Diagnosis not present

## 2022-02-28 DIAGNOSIS — Z992 Dependence on renal dialysis: Secondary | ICD-10-CM | POA: Diagnosis not present

## 2022-02-28 DIAGNOSIS — D631 Anemia in chronic kidney disease: Secondary | ICD-10-CM | POA: Diagnosis not present

## 2022-03-02 DIAGNOSIS — D631 Anemia in chronic kidney disease: Secondary | ICD-10-CM | POA: Diagnosis not present

## 2022-03-02 DIAGNOSIS — E1129 Type 2 diabetes mellitus with other diabetic kidney complication: Secondary | ICD-10-CM | POA: Diagnosis not present

## 2022-03-02 DIAGNOSIS — D509 Iron deficiency anemia, unspecified: Secondary | ICD-10-CM | POA: Diagnosis not present

## 2022-03-02 DIAGNOSIS — N2581 Secondary hyperparathyroidism of renal origin: Secondary | ICD-10-CM | POA: Diagnosis not present

## 2022-03-02 DIAGNOSIS — N186 End stage renal disease: Secondary | ICD-10-CM | POA: Diagnosis not present

## 2022-03-02 DIAGNOSIS — Z992 Dependence on renal dialysis: Secondary | ICD-10-CM | POA: Diagnosis not present

## 2022-03-05 DIAGNOSIS — Z992 Dependence on renal dialysis: Secondary | ICD-10-CM | POA: Diagnosis not present

## 2022-03-05 DIAGNOSIS — N186 End stage renal disease: Secondary | ICD-10-CM | POA: Diagnosis not present

## 2022-03-05 DIAGNOSIS — E1129 Type 2 diabetes mellitus with other diabetic kidney complication: Secondary | ICD-10-CM | POA: Diagnosis not present

## 2022-03-05 DIAGNOSIS — N2581 Secondary hyperparathyroidism of renal origin: Secondary | ICD-10-CM | POA: Diagnosis not present

## 2022-03-05 DIAGNOSIS — D631 Anemia in chronic kidney disease: Secondary | ICD-10-CM | POA: Diagnosis not present

## 2022-03-05 DIAGNOSIS — D509 Iron deficiency anemia, unspecified: Secondary | ICD-10-CM | POA: Diagnosis not present

## 2022-03-07 DIAGNOSIS — D631 Anemia in chronic kidney disease: Secondary | ICD-10-CM | POA: Diagnosis not present

## 2022-03-07 DIAGNOSIS — N186 End stage renal disease: Secondary | ICD-10-CM | POA: Diagnosis not present

## 2022-03-07 DIAGNOSIS — Z992 Dependence on renal dialysis: Secondary | ICD-10-CM | POA: Diagnosis not present

## 2022-03-07 DIAGNOSIS — E1129 Type 2 diabetes mellitus with other diabetic kidney complication: Secondary | ICD-10-CM | POA: Diagnosis not present

## 2022-03-07 DIAGNOSIS — N2581 Secondary hyperparathyroidism of renal origin: Secondary | ICD-10-CM | POA: Diagnosis not present

## 2022-03-07 DIAGNOSIS — D509 Iron deficiency anemia, unspecified: Secondary | ICD-10-CM | POA: Diagnosis not present

## 2022-03-08 DIAGNOSIS — M5416 Radiculopathy, lumbar region: Secondary | ICD-10-CM | POA: Diagnosis not present

## 2022-03-09 DIAGNOSIS — N186 End stage renal disease: Secondary | ICD-10-CM | POA: Diagnosis not present

## 2022-03-09 DIAGNOSIS — Z992 Dependence on renal dialysis: Secondary | ICD-10-CM | POA: Diagnosis not present

## 2022-03-09 DIAGNOSIS — D631 Anemia in chronic kidney disease: Secondary | ICD-10-CM | POA: Diagnosis not present

## 2022-03-09 DIAGNOSIS — D509 Iron deficiency anemia, unspecified: Secondary | ICD-10-CM | POA: Diagnosis not present

## 2022-03-09 DIAGNOSIS — E1129 Type 2 diabetes mellitus with other diabetic kidney complication: Secondary | ICD-10-CM | POA: Diagnosis not present

## 2022-03-09 DIAGNOSIS — N2581 Secondary hyperparathyroidism of renal origin: Secondary | ICD-10-CM | POA: Diagnosis not present

## 2022-03-12 DIAGNOSIS — Z992 Dependence on renal dialysis: Secondary | ICD-10-CM | POA: Diagnosis not present

## 2022-03-12 DIAGNOSIS — N186 End stage renal disease: Secondary | ICD-10-CM | POA: Diagnosis not present

## 2022-03-12 DIAGNOSIS — N2581 Secondary hyperparathyroidism of renal origin: Secondary | ICD-10-CM | POA: Diagnosis not present

## 2022-03-12 DIAGNOSIS — D631 Anemia in chronic kidney disease: Secondary | ICD-10-CM | POA: Diagnosis not present

## 2022-03-12 DIAGNOSIS — D509 Iron deficiency anemia, unspecified: Secondary | ICD-10-CM | POA: Diagnosis not present

## 2022-03-12 DIAGNOSIS — E1129 Type 2 diabetes mellitus with other diabetic kidney complication: Secondary | ICD-10-CM | POA: Diagnosis not present

## 2022-03-14 DIAGNOSIS — D509 Iron deficiency anemia, unspecified: Secondary | ICD-10-CM | POA: Diagnosis not present

## 2022-03-14 DIAGNOSIS — D631 Anemia in chronic kidney disease: Secondary | ICD-10-CM | POA: Diagnosis not present

## 2022-03-14 DIAGNOSIS — Z992 Dependence on renal dialysis: Secondary | ICD-10-CM | POA: Diagnosis not present

## 2022-03-14 DIAGNOSIS — N186 End stage renal disease: Secondary | ICD-10-CM | POA: Diagnosis not present

## 2022-03-14 DIAGNOSIS — E1129 Type 2 diabetes mellitus with other diabetic kidney complication: Secondary | ICD-10-CM | POA: Diagnosis not present

## 2022-03-14 DIAGNOSIS — N2581 Secondary hyperparathyroidism of renal origin: Secondary | ICD-10-CM | POA: Diagnosis not present

## 2022-03-15 DIAGNOSIS — M25571 Pain in right ankle and joints of right foot: Secondary | ICD-10-CM | POA: Diagnosis not present

## 2022-03-16 DIAGNOSIS — E1129 Type 2 diabetes mellitus with other diabetic kidney complication: Secondary | ICD-10-CM | POA: Diagnosis not present

## 2022-03-16 DIAGNOSIS — Z992 Dependence on renal dialysis: Secondary | ICD-10-CM | POA: Diagnosis not present

## 2022-03-16 DIAGNOSIS — D631 Anemia in chronic kidney disease: Secondary | ICD-10-CM | POA: Diagnosis not present

## 2022-03-16 DIAGNOSIS — N186 End stage renal disease: Secondary | ICD-10-CM | POA: Diagnosis not present

## 2022-03-16 DIAGNOSIS — N2581 Secondary hyperparathyroidism of renal origin: Secondary | ICD-10-CM | POA: Diagnosis not present

## 2022-03-16 DIAGNOSIS — D509 Iron deficiency anemia, unspecified: Secondary | ICD-10-CM | POA: Diagnosis not present

## 2022-03-19 DIAGNOSIS — N186 End stage renal disease: Secondary | ICD-10-CM | POA: Diagnosis not present

## 2022-03-19 DIAGNOSIS — N2581 Secondary hyperparathyroidism of renal origin: Secondary | ICD-10-CM | POA: Diagnosis not present

## 2022-03-19 DIAGNOSIS — Z992 Dependence on renal dialysis: Secondary | ICD-10-CM | POA: Diagnosis not present

## 2022-03-19 DIAGNOSIS — D509 Iron deficiency anemia, unspecified: Secondary | ICD-10-CM | POA: Diagnosis not present

## 2022-03-19 DIAGNOSIS — E1129 Type 2 diabetes mellitus with other diabetic kidney complication: Secondary | ICD-10-CM | POA: Diagnosis not present

## 2022-03-19 DIAGNOSIS — D631 Anemia in chronic kidney disease: Secondary | ICD-10-CM | POA: Diagnosis not present

## 2022-03-21 DIAGNOSIS — D509 Iron deficiency anemia, unspecified: Secondary | ICD-10-CM | POA: Diagnosis not present

## 2022-03-21 DIAGNOSIS — D631 Anemia in chronic kidney disease: Secondary | ICD-10-CM | POA: Diagnosis not present

## 2022-03-21 DIAGNOSIS — E1129 Type 2 diabetes mellitus with other diabetic kidney complication: Secondary | ICD-10-CM | POA: Diagnosis not present

## 2022-03-21 DIAGNOSIS — N2581 Secondary hyperparathyroidism of renal origin: Secondary | ICD-10-CM | POA: Diagnosis not present

## 2022-03-21 DIAGNOSIS — Z992 Dependence on renal dialysis: Secondary | ICD-10-CM | POA: Diagnosis not present

## 2022-03-21 DIAGNOSIS — N186 End stage renal disease: Secondary | ICD-10-CM | POA: Diagnosis not present

## 2022-03-23 DIAGNOSIS — E1129 Type 2 diabetes mellitus with other diabetic kidney complication: Secondary | ICD-10-CM | POA: Diagnosis not present

## 2022-03-23 DIAGNOSIS — D509 Iron deficiency anemia, unspecified: Secondary | ICD-10-CM | POA: Diagnosis not present

## 2022-03-23 DIAGNOSIS — Z992 Dependence on renal dialysis: Secondary | ICD-10-CM | POA: Diagnosis not present

## 2022-03-23 DIAGNOSIS — N2581 Secondary hyperparathyroidism of renal origin: Secondary | ICD-10-CM | POA: Diagnosis not present

## 2022-03-23 DIAGNOSIS — N186 End stage renal disease: Secondary | ICD-10-CM | POA: Diagnosis not present

## 2022-03-23 DIAGNOSIS — D631 Anemia in chronic kidney disease: Secondary | ICD-10-CM | POA: Diagnosis not present

## 2022-03-26 DIAGNOSIS — D509 Iron deficiency anemia, unspecified: Secondary | ICD-10-CM | POA: Diagnosis not present

## 2022-03-26 DIAGNOSIS — N2581 Secondary hyperparathyroidism of renal origin: Secondary | ICD-10-CM | POA: Diagnosis not present

## 2022-03-26 DIAGNOSIS — E1129 Type 2 diabetes mellitus with other diabetic kidney complication: Secondary | ICD-10-CM | POA: Diagnosis not present

## 2022-03-26 DIAGNOSIS — Z992 Dependence on renal dialysis: Secondary | ICD-10-CM | POA: Diagnosis not present

## 2022-03-26 DIAGNOSIS — M25571 Pain in right ankle and joints of right foot: Secondary | ICD-10-CM | POA: Diagnosis not present

## 2022-03-26 DIAGNOSIS — M79671 Pain in right foot: Secondary | ICD-10-CM | POA: Diagnosis not present

## 2022-03-26 DIAGNOSIS — N186 End stage renal disease: Secondary | ICD-10-CM | POA: Diagnosis not present

## 2022-03-26 DIAGNOSIS — D631 Anemia in chronic kidney disease: Secondary | ICD-10-CM | POA: Diagnosis not present

## 2022-03-26 DIAGNOSIS — M14671 Charcot's joint, right ankle and foot: Secondary | ICD-10-CM | POA: Diagnosis not present

## 2022-03-27 ENCOUNTER — Ambulatory Visit: Payer: Self-pay

## 2022-03-27 DIAGNOSIS — N186 End stage renal disease: Secondary | ICD-10-CM | POA: Diagnosis not present

## 2022-03-27 DIAGNOSIS — Z992 Dependence on renal dialysis: Secondary | ICD-10-CM | POA: Diagnosis not present

## 2022-03-27 DIAGNOSIS — T8620 Unspecified complication of heart transplant: Secondary | ICD-10-CM | POA: Diagnosis not present

## 2022-03-27 NOTE — Patient Outreach (Signed)
  Care Coordination   Initial Visit Note   03/27/2022 Name: [name] MRN: [id number] DOB: August 04, 1961  [name] is a 61 y.o. year old male who sees Carol Ada, MD for primary care  Telephone outreach was unsuccessful. A HIPPA compliant phone message was left for the patient providing contact information and requesting a return call.     Goals Addressed   None     SDOH assessments and interventions completed:   No   Care Coordination Interventions Activated:  No Care Coordination Interventions:  No, not indicated  Follow up plan: Follow up call scheduled for 03/29/22  Encounter Outcome:  Pt. Visit Completed   Lazaro Arms RN, BSN, [facility]   Phone: [phone]

## 2022-03-28 DIAGNOSIS — Z992 Dependence on renal dialysis: Secondary | ICD-10-CM | POA: Diagnosis not present

## 2022-03-28 DIAGNOSIS — E1129 Type 2 diabetes mellitus with other diabetic kidney complication: Secondary | ICD-10-CM | POA: Diagnosis not present

## 2022-03-28 DIAGNOSIS — D509 Iron deficiency anemia, unspecified: Secondary | ICD-10-CM | POA: Diagnosis not present

## 2022-03-28 DIAGNOSIS — N186 End stage renal disease: Secondary | ICD-10-CM | POA: Diagnosis not present

## 2022-03-28 DIAGNOSIS — D631 Anemia in chronic kidney disease: Secondary | ICD-10-CM | POA: Diagnosis not present

## 2022-03-28 DIAGNOSIS — N2581 Secondary hyperparathyroidism of renal origin: Secondary | ICD-10-CM | POA: Diagnosis not present

## 2022-03-29 ENCOUNTER — Telehealth: Payer: Self-pay | Admitting: *Deleted

## 2022-03-29 ENCOUNTER — Ambulatory Visit: Payer: Self-pay

## 2022-03-29 DIAGNOSIS — Z789 Other specified health status: Secondary | ICD-10-CM

## 2022-03-29 NOTE — Patient Outreach (Signed)
  Care Coordination   Initial Visit Note   03/29/2022 Name: GIOVANI NEUMEISTER MRN: [id number] DOB: 05-22-1961  ARICK MARENO is a 61 y.o. year old male who sees Carol Ada, MD for primary care. I spoke with  Esperanza Sheets and his spouse by phone today  What matters to the patients health and wellness today?  Trssnsportaion to amedical appointments due to foot issues.    Goals Addressed               This Visit's Progress     COMPLETED: I need transportation to my appointments due to injury to his foot and it is hard for his wirfe to help him. (pt-stated)        Care Coordination Interventions:  Solution-Focused Strategies employed:  Active listening / Reflection utilized  Problem [place] strategies reviewed Caregiver stress acknowledged  Made referral to care guides         SDOH assessments and interventions completed:  Yes  SDOH Interventions Today    Flowsheet Row Most Recent Value  SDOH Interventions   Food Insecurity Interventions Intervention Not Indicated  Housing Interventions Intervention Not Indicated  Transportation Interventions --  [Send to care guides]        Care Coordination Interventions Activated:  Yes  Care Coordination Interventions:  Yes, provided   Follow up plan: No further intervention required.   Encounter Outcome:  Pt. Visit Completed   Lazaro Arms RN, BSN, [facility]   Phone: [phone]

## 2022-03-29 NOTE — Telephone Encounter (Signed)
   Telephone encounter was:  Unsuccessful.  03/29/2022 Name: [name] MRN: [id number] DOB: 1961-05-07  Unsuccessful outbound call made today to assist with:  Transportation Needs   Outreach Attempt:  1st Attempt  A HIPAA compliant voice message was left requesting a return call.  Instructed patient to call back at   Instructed patient to call back at [phone]  at their earliest [address] , [place] [address] Email : [email or web address]

## 2022-03-29 NOTE — Patient Instructions (Signed)
Visit Information  Thank you for taking time to visit with me today. Please don't hesitate to contact me if I can be of assistance to you.   Following are the goals we discussed today:   Goals Addressed               This Visit's Progress     COMPLETED: I need transportation to my appointments due to injury to his foot and it is hard for his wirfe to help him. (pt-stated)        Care Coordination Interventions:  Solution-Focused Strategies employed:  Active listening / Reflection utilized  Problem [place] strategies reviewed Caregiver stress acknowledged  Made referral to care guides           Patient verbalizes understanding of instructions and care plan provided today and agrees to view in [place]. Active MyChart status and patient understanding of how to access instructions and care plan via MyChart confirmed with patient.      Lazaro Arms RN, BSN, [facility]   Phone: [phone]

## 2022-03-30 ENCOUNTER — Telehealth: Payer: Self-pay | Admitting: *Deleted

## 2022-03-30 DIAGNOSIS — N2581 Secondary hyperparathyroidism of renal origin: Secondary | ICD-10-CM | POA: Diagnosis not present

## 2022-03-30 DIAGNOSIS — E1129 Type 2 diabetes mellitus with other diabetic kidney complication: Secondary | ICD-10-CM | POA: Diagnosis not present

## 2022-03-30 DIAGNOSIS — N186 End stage renal disease: Secondary | ICD-10-CM | POA: Diagnosis not present

## 2022-03-30 DIAGNOSIS — D631 Anemia in chronic kidney disease: Secondary | ICD-10-CM | POA: Diagnosis not present

## 2022-03-30 DIAGNOSIS — Z992 Dependence on renal dialysis: Secondary | ICD-10-CM | POA: Diagnosis not present

## 2022-03-30 DIAGNOSIS — D509 Iron deficiency anemia, unspecified: Secondary | ICD-10-CM | POA: Diagnosis not present

## 2022-03-30 NOTE — Telephone Encounter (Signed)
   Telephone encounter was:  Successful.  03/30/2022 Name: [name] MRN: [id number] DOB: Apr 01, 1961  [name] is a 61 y.o. year old male who is a primary care patient of Carol Ada, MD . The community resource team was consulted for assistance with Transportation Needs  Patient provided information about [employer] transportation and [employer] as well information for a ramp Care guide performed the following interventions: Patient provided with information about care guide support team and interviewed to confirm resource needs.  Follow Up Plan:  No further follow up planned at this time. The patient has been provided with needed resources.  [place] [phone] [address] , [place] [address] Email : [email or web address]

## 2022-04-02 DIAGNOSIS — Z992 Dependence on renal dialysis: Secondary | ICD-10-CM | POA: Diagnosis not present

## 2022-04-02 DIAGNOSIS — D631 Anemia in chronic kidney disease: Secondary | ICD-10-CM | POA: Diagnosis not present

## 2022-04-02 DIAGNOSIS — D509 Iron deficiency anemia, unspecified: Secondary | ICD-10-CM | POA: Diagnosis not present

## 2022-04-02 DIAGNOSIS — N186 End stage renal disease: Secondary | ICD-10-CM | POA: Diagnosis not present

## 2022-04-02 DIAGNOSIS — N2581 Secondary hyperparathyroidism of renal origin: Secondary | ICD-10-CM | POA: Diagnosis not present

## 2022-04-02 DIAGNOSIS — E1129 Type 2 diabetes mellitus with other diabetic kidney complication: Secondary | ICD-10-CM | POA: Diagnosis not present

## 2022-04-04 DIAGNOSIS — N186 End stage renal disease: Secondary | ICD-10-CM | POA: Diagnosis not present

## 2022-04-04 DIAGNOSIS — N2581 Secondary hyperparathyroidism of renal origin: Secondary | ICD-10-CM | POA: Diagnosis not present

## 2022-04-04 DIAGNOSIS — D509 Iron deficiency anemia, unspecified: Secondary | ICD-10-CM | POA: Diagnosis not present

## 2022-04-04 DIAGNOSIS — D631 Anemia in chronic kidney disease: Secondary | ICD-10-CM | POA: Diagnosis not present

## 2022-04-04 DIAGNOSIS — Z992 Dependence on renal dialysis: Secondary | ICD-10-CM | POA: Diagnosis not present

## 2022-04-04 DIAGNOSIS — E1129 Type 2 diabetes mellitus with other diabetic kidney complication: Secondary | ICD-10-CM | POA: Diagnosis not present

## 2022-04-06 DIAGNOSIS — D509 Iron deficiency anemia, unspecified: Secondary | ICD-10-CM | POA: Diagnosis not present

## 2022-04-06 DIAGNOSIS — N186 End stage renal disease: Secondary | ICD-10-CM | POA: Diagnosis not present

## 2022-04-06 DIAGNOSIS — D631 Anemia in chronic kidney disease: Secondary | ICD-10-CM | POA: Diagnosis not present

## 2022-04-06 DIAGNOSIS — N2581 Secondary hyperparathyroidism of renal origin: Secondary | ICD-10-CM | POA: Diagnosis not present

## 2022-04-06 DIAGNOSIS — Z992 Dependence on renal dialysis: Secondary | ICD-10-CM | POA: Diagnosis not present

## 2022-04-06 DIAGNOSIS — E1129 Type 2 diabetes mellitus with other diabetic kidney complication: Secondary | ICD-10-CM | POA: Diagnosis not present

## 2022-04-09 DIAGNOSIS — Z992 Dependence on renal dialysis: Secondary | ICD-10-CM | POA: Diagnosis not present

## 2022-04-09 DIAGNOSIS — D509 Iron deficiency anemia, unspecified: Secondary | ICD-10-CM | POA: Diagnosis not present

## 2022-04-09 DIAGNOSIS — D631 Anemia in chronic kidney disease: Secondary | ICD-10-CM | POA: Diagnosis not present

## 2022-04-09 DIAGNOSIS — M14671 Charcot's joint, right ankle and foot: Secondary | ICD-10-CM | POA: Diagnosis not present

## 2022-04-09 DIAGNOSIS — E1129 Type 2 diabetes mellitus with other diabetic kidney complication: Secondary | ICD-10-CM | POA: Diagnosis not present

## 2022-04-09 DIAGNOSIS — N186 End stage renal disease: Secondary | ICD-10-CM | POA: Diagnosis not present

## 2022-04-09 DIAGNOSIS — N2581 Secondary hyperparathyroidism of renal origin: Secondary | ICD-10-CM | POA: Diagnosis not present

## 2022-04-10 DIAGNOSIS — N2581 Secondary hyperparathyroidism of renal origin: Secondary | ICD-10-CM | POA: Diagnosis not present

## 2022-04-10 DIAGNOSIS — E8770 Fluid overload, unspecified: Secondary | ICD-10-CM | POA: Diagnosis not present

## 2022-04-10 DIAGNOSIS — N186 End stage renal disease: Secondary | ICD-10-CM | POA: Diagnosis not present

## 2022-04-10 DIAGNOSIS — Z79899 Other long term (current) drug therapy: Secondary | ICD-10-CM | POA: Diagnosis not present

## 2022-04-10 DIAGNOSIS — Z992 Dependence on renal dialysis: Secondary | ICD-10-CM | POA: Diagnosis not present

## 2022-04-11 DIAGNOSIS — E1129 Type 2 diabetes mellitus with other diabetic kidney complication: Secondary | ICD-10-CM | POA: Diagnosis not present

## 2022-04-11 DIAGNOSIS — Z992 Dependence on renal dialysis: Secondary | ICD-10-CM | POA: Diagnosis not present

## 2022-04-11 DIAGNOSIS — N2581 Secondary hyperparathyroidism of renal origin: Secondary | ICD-10-CM | POA: Diagnosis not present

## 2022-04-11 DIAGNOSIS — N186 End stage renal disease: Secondary | ICD-10-CM | POA: Diagnosis not present

## 2022-04-11 DIAGNOSIS — D631 Anemia in chronic kidney disease: Secondary | ICD-10-CM | POA: Diagnosis not present

## 2022-04-11 DIAGNOSIS — D509 Iron deficiency anemia, unspecified: Secondary | ICD-10-CM | POA: Diagnosis not present

## 2022-04-13 DIAGNOSIS — D509 Iron deficiency anemia, unspecified: Secondary | ICD-10-CM | POA: Diagnosis not present

## 2022-04-13 DIAGNOSIS — Z992 Dependence on renal dialysis: Secondary | ICD-10-CM | POA: Diagnosis not present

## 2022-04-13 DIAGNOSIS — N2581 Secondary hyperparathyroidism of renal origin: Secondary | ICD-10-CM | POA: Diagnosis not present

## 2022-04-13 DIAGNOSIS — N186 End stage renal disease: Secondary | ICD-10-CM | POA: Diagnosis not present

## 2022-04-13 DIAGNOSIS — E1129 Type 2 diabetes mellitus with other diabetic kidney complication: Secondary | ICD-10-CM | POA: Diagnosis not present

## 2022-04-13 DIAGNOSIS — D631 Anemia in chronic kidney disease: Secondary | ICD-10-CM | POA: Diagnosis not present

## 2022-04-16 DIAGNOSIS — D631 Anemia in chronic kidney disease: Secondary | ICD-10-CM | POA: Diagnosis not present

## 2022-04-16 DIAGNOSIS — E1129 Type 2 diabetes mellitus with other diabetic kidney complication: Secondary | ICD-10-CM | POA: Diagnosis not present

## 2022-04-16 DIAGNOSIS — Z992 Dependence on renal dialysis: Secondary | ICD-10-CM | POA: Diagnosis not present

## 2022-04-16 DIAGNOSIS — N2581 Secondary hyperparathyroidism of renal origin: Secondary | ICD-10-CM | POA: Diagnosis not present

## 2022-04-16 DIAGNOSIS — N186 End stage renal disease: Secondary | ICD-10-CM | POA: Diagnosis not present

## 2022-04-16 DIAGNOSIS — D509 Iron deficiency anemia, unspecified: Secondary | ICD-10-CM | POA: Diagnosis not present

## 2022-04-18 DIAGNOSIS — D631 Anemia in chronic kidney disease: Secondary | ICD-10-CM | POA: Diagnosis not present

## 2022-04-18 DIAGNOSIS — N186 End stage renal disease: Secondary | ICD-10-CM | POA: Diagnosis not present

## 2022-04-18 DIAGNOSIS — Z992 Dependence on renal dialysis: Secondary | ICD-10-CM | POA: Diagnosis not present

## 2022-04-18 DIAGNOSIS — N2581 Secondary hyperparathyroidism of renal origin: Secondary | ICD-10-CM | POA: Diagnosis not present

## 2022-04-18 DIAGNOSIS — E1129 Type 2 diabetes mellitus with other diabetic kidney complication: Secondary | ICD-10-CM | POA: Diagnosis not present

## 2022-04-18 DIAGNOSIS — D509 Iron deficiency anemia, unspecified: Secondary | ICD-10-CM | POA: Diagnosis not present

## 2022-04-20 DIAGNOSIS — Z992 Dependence on renal dialysis: Secondary | ICD-10-CM | POA: Diagnosis not present

## 2022-04-20 DIAGNOSIS — D631 Anemia in chronic kidney disease: Secondary | ICD-10-CM | POA: Diagnosis not present

## 2022-04-20 DIAGNOSIS — N2581 Secondary hyperparathyroidism of renal origin: Secondary | ICD-10-CM | POA: Diagnosis not present

## 2022-04-20 DIAGNOSIS — D509 Iron deficiency anemia, unspecified: Secondary | ICD-10-CM | POA: Diagnosis not present

## 2022-04-20 DIAGNOSIS — E1129 Type 2 diabetes mellitus with other diabetic kidney complication: Secondary | ICD-10-CM | POA: Diagnosis not present

## 2022-04-20 DIAGNOSIS — N186 End stage renal disease: Secondary | ICD-10-CM | POA: Diagnosis not present

## 2022-04-23 DIAGNOSIS — D631 Anemia in chronic kidney disease: Secondary | ICD-10-CM | POA: Diagnosis not present

## 2022-04-23 DIAGNOSIS — N2581 Secondary hyperparathyroidism of renal origin: Secondary | ICD-10-CM | POA: Diagnosis not present

## 2022-04-23 DIAGNOSIS — Z992 Dependence on renal dialysis: Secondary | ICD-10-CM | POA: Diagnosis not present

## 2022-04-23 DIAGNOSIS — N186 End stage renal disease: Secondary | ICD-10-CM | POA: Diagnosis not present

## 2022-04-23 DIAGNOSIS — D509 Iron deficiency anemia, unspecified: Secondary | ICD-10-CM | POA: Diagnosis not present

## 2022-04-23 DIAGNOSIS — E1129 Type 2 diabetes mellitus with other diabetic kidney complication: Secondary | ICD-10-CM | POA: Diagnosis not present

## 2022-04-25 DIAGNOSIS — D631 Anemia in chronic kidney disease: Secondary | ICD-10-CM | POA: Diagnosis not present

## 2022-04-25 DIAGNOSIS — Z992 Dependence on renal dialysis: Secondary | ICD-10-CM | POA: Diagnosis not present

## 2022-04-25 DIAGNOSIS — D509 Iron deficiency anemia, unspecified: Secondary | ICD-10-CM | POA: Diagnosis not present

## 2022-04-25 DIAGNOSIS — N186 End stage renal disease: Secondary | ICD-10-CM | POA: Diagnosis not present

## 2022-04-25 DIAGNOSIS — E1129 Type 2 diabetes mellitus with other diabetic kidney complication: Secondary | ICD-10-CM | POA: Diagnosis not present

## 2022-04-25 DIAGNOSIS — N2581 Secondary hyperparathyroidism of renal origin: Secondary | ICD-10-CM | POA: Diagnosis not present

## 2022-04-26 DIAGNOSIS — Z8546 Personal history of malignant neoplasm of prostate: Secondary | ICD-10-CM | POA: Diagnosis not present

## 2022-04-26 DIAGNOSIS — I1311 Hypertensive heart and chronic kidney disease without heart failure, with stage 5 chronic kidney disease, or end stage renal disease: Secondary | ICD-10-CM | POA: Diagnosis not present

## 2022-04-26 DIAGNOSIS — I371 Nonrheumatic pulmonary valve insufficiency: Secondary | ICD-10-CM | POA: Diagnosis not present

## 2022-04-26 DIAGNOSIS — Z79899 Other long term (current) drug therapy: Secondary | ICD-10-CM | POA: Diagnosis not present

## 2022-04-26 DIAGNOSIS — Z941 Heart transplant status: Secondary | ICD-10-CM | POA: Diagnosis not present

## 2022-04-26 DIAGNOSIS — Z4821 Encounter for aftercare following heart transplant: Secondary | ICD-10-CM | POA: Diagnosis not present

## 2022-04-26 DIAGNOSIS — D849 Immunodeficiency, unspecified: Secondary | ICD-10-CM | POA: Diagnosis not present

## 2022-04-26 DIAGNOSIS — Z298 Encounter for other specified prophylactic measures: Secondary | ICD-10-CM | POA: Diagnosis not present

## 2022-04-26 DIAGNOSIS — D84821 Immunodeficiency due to drugs: Secondary | ICD-10-CM | POA: Diagnosis not present

## 2022-04-26 DIAGNOSIS — N186 End stage renal disease: Secondary | ICD-10-CM | POA: Diagnosis not present

## 2022-04-26 DIAGNOSIS — Z48298 Encounter for aftercare following other organ transplant: Secondary | ICD-10-CM | POA: Diagnosis not present

## 2022-04-26 DIAGNOSIS — I25811 Atherosclerosis of native coronary artery of transplanted heart without angina pectoris: Secondary | ICD-10-CM | POA: Diagnosis not present

## 2022-04-26 DIAGNOSIS — Z992 Dependence on renal dialysis: Secondary | ICD-10-CM | POA: Diagnosis not present

## 2022-04-26 DIAGNOSIS — R931 Abnormal findings on diagnostic imaging of heart and coronary circulation: Secondary | ICD-10-CM | POA: Diagnosis not present

## 2022-04-26 DIAGNOSIS — I959 Hypotension, unspecified: Secondary | ICD-10-CM | POA: Diagnosis not present

## 2022-04-26 DIAGNOSIS — I2510 Atherosclerotic heart disease of native coronary artery without angina pectoris: Secondary | ICD-10-CM | POA: Diagnosis not present

## 2022-04-27 DIAGNOSIS — T8620 Unspecified complication of heart transplant: Secondary | ICD-10-CM | POA: Diagnosis not present

## 2022-04-27 DIAGNOSIS — N2581 Secondary hyperparathyroidism of renal origin: Secondary | ICD-10-CM | POA: Diagnosis not present

## 2022-04-27 DIAGNOSIS — Z992 Dependence on renal dialysis: Secondary | ICD-10-CM | POA: Diagnosis not present

## 2022-04-27 DIAGNOSIS — N186 End stage renal disease: Secondary | ICD-10-CM | POA: Diagnosis not present

## 2022-04-27 DIAGNOSIS — D509 Iron deficiency anemia, unspecified: Secondary | ICD-10-CM | POA: Diagnosis not present

## 2022-04-27 DIAGNOSIS — D631 Anemia in chronic kidney disease: Secondary | ICD-10-CM | POA: Diagnosis not present

## 2022-04-27 DIAGNOSIS — E1129 Type 2 diabetes mellitus with other diabetic kidney complication: Secondary | ICD-10-CM | POA: Diagnosis not present

## 2022-04-30 DIAGNOSIS — E1129 Type 2 diabetes mellitus with other diabetic kidney complication: Secondary | ICD-10-CM | POA: Diagnosis not present

## 2022-04-30 DIAGNOSIS — D631 Anemia in chronic kidney disease: Secondary | ICD-10-CM | POA: Diagnosis not present

## 2022-04-30 DIAGNOSIS — N186 End stage renal disease: Secondary | ICD-10-CM | POA: Diagnosis not present

## 2022-04-30 DIAGNOSIS — D509 Iron deficiency anemia, unspecified: Secondary | ICD-10-CM | POA: Diagnosis not present

## 2022-04-30 DIAGNOSIS — N2581 Secondary hyperparathyroidism of renal origin: Secondary | ICD-10-CM | POA: Diagnosis not present

## 2022-04-30 DIAGNOSIS — Z992 Dependence on renal dialysis: Secondary | ICD-10-CM | POA: Diagnosis not present

## 2022-05-02 DIAGNOSIS — M14671 Charcot's joint, right ankle and foot: Secondary | ICD-10-CM | POA: Diagnosis not present

## 2022-05-03 DIAGNOSIS — D509 Iron deficiency anemia, unspecified: Secondary | ICD-10-CM | POA: Diagnosis not present

## 2022-05-03 DIAGNOSIS — N2581 Secondary hyperparathyroidism of renal origin: Secondary | ICD-10-CM | POA: Diagnosis not present

## 2022-05-03 DIAGNOSIS — N186 End stage renal disease: Secondary | ICD-10-CM | POA: Diagnosis not present

## 2022-05-03 DIAGNOSIS — D631 Anemia in chronic kidney disease: Secondary | ICD-10-CM | POA: Diagnosis not present

## 2022-05-03 DIAGNOSIS — Z992 Dependence on renal dialysis: Secondary | ICD-10-CM | POA: Diagnosis not present

## 2022-05-03 DIAGNOSIS — E1129 Type 2 diabetes mellitus with other diabetic kidney complication: Secondary | ICD-10-CM | POA: Diagnosis not present

## 2022-05-04 DIAGNOSIS — Z992 Dependence on renal dialysis: Secondary | ICD-10-CM | POA: Diagnosis not present

## 2022-05-04 DIAGNOSIS — N186 End stage renal disease: Secondary | ICD-10-CM | POA: Diagnosis not present

## 2022-05-04 DIAGNOSIS — N2581 Secondary hyperparathyroidism of renal origin: Secondary | ICD-10-CM | POA: Diagnosis not present

## 2022-05-04 DIAGNOSIS — D509 Iron deficiency anemia, unspecified: Secondary | ICD-10-CM | POA: Diagnosis not present

## 2022-05-04 DIAGNOSIS — D631 Anemia in chronic kidney disease: Secondary | ICD-10-CM | POA: Diagnosis not present

## 2022-05-04 DIAGNOSIS — E1129 Type 2 diabetes mellitus with other diabetic kidney complication: Secondary | ICD-10-CM | POA: Diagnosis not present

## 2022-05-07 DIAGNOSIS — D509 Iron deficiency anemia, unspecified: Secondary | ICD-10-CM | POA: Diagnosis not present

## 2022-05-07 DIAGNOSIS — E1129 Type 2 diabetes mellitus with other diabetic kidney complication: Secondary | ICD-10-CM | POA: Diagnosis not present

## 2022-05-07 DIAGNOSIS — N2581 Secondary hyperparathyroidism of renal origin: Secondary | ICD-10-CM | POA: Diagnosis not present

## 2022-05-07 DIAGNOSIS — D631 Anemia in chronic kidney disease: Secondary | ICD-10-CM | POA: Diagnosis not present

## 2022-05-07 DIAGNOSIS — Z992 Dependence on renal dialysis: Secondary | ICD-10-CM | POA: Diagnosis not present

## 2022-05-07 DIAGNOSIS — N186 End stage renal disease: Secondary | ICD-10-CM | POA: Diagnosis not present

## 2022-05-08 DIAGNOSIS — M5416 Radiculopathy, lumbar region: Secondary | ICD-10-CM | POA: Diagnosis not present

## 2022-05-09 DIAGNOSIS — N186 End stage renal disease: Secondary | ICD-10-CM | POA: Diagnosis not present

## 2022-05-09 DIAGNOSIS — D631 Anemia in chronic kidney disease: Secondary | ICD-10-CM | POA: Diagnosis not present

## 2022-05-09 DIAGNOSIS — E1129 Type 2 diabetes mellitus with other diabetic kidney complication: Secondary | ICD-10-CM | POA: Diagnosis not present

## 2022-05-09 DIAGNOSIS — Z992 Dependence on renal dialysis: Secondary | ICD-10-CM | POA: Diagnosis not present

## 2022-05-09 DIAGNOSIS — N2581 Secondary hyperparathyroidism of renal origin: Secondary | ICD-10-CM | POA: Diagnosis not present

## 2022-05-09 DIAGNOSIS — D509 Iron deficiency anemia, unspecified: Secondary | ICD-10-CM | POA: Diagnosis not present

## 2022-05-10 DIAGNOSIS — Z79899 Other long term (current) drug therapy: Secondary | ICD-10-CM | POA: Diagnosis not present

## 2022-05-11 DIAGNOSIS — D631 Anemia in chronic kidney disease: Secondary | ICD-10-CM | POA: Diagnosis not present

## 2022-05-11 DIAGNOSIS — N2581 Secondary hyperparathyroidism of renal origin: Secondary | ICD-10-CM | POA: Diagnosis not present

## 2022-05-11 DIAGNOSIS — D509 Iron deficiency anemia, unspecified: Secondary | ICD-10-CM | POA: Diagnosis not present

## 2022-05-11 DIAGNOSIS — E1129 Type 2 diabetes mellitus with other diabetic kidney complication: Secondary | ICD-10-CM | POA: Diagnosis not present

## 2022-05-11 DIAGNOSIS — N186 End stage renal disease: Secondary | ICD-10-CM | POA: Diagnosis not present

## 2022-05-11 DIAGNOSIS — Z992 Dependence on renal dialysis: Secondary | ICD-10-CM | POA: Diagnosis not present

## 2022-05-14 DIAGNOSIS — D631 Anemia in chronic kidney disease: Secondary | ICD-10-CM | POA: Diagnosis not present

## 2022-05-14 DIAGNOSIS — D509 Iron deficiency anemia, unspecified: Secondary | ICD-10-CM | POA: Diagnosis not present

## 2022-05-14 DIAGNOSIS — E1129 Type 2 diabetes mellitus with other diabetic kidney complication: Secondary | ICD-10-CM | POA: Diagnosis not present

## 2022-05-14 DIAGNOSIS — Z992 Dependence on renal dialysis: Secondary | ICD-10-CM | POA: Diagnosis not present

## 2022-05-14 DIAGNOSIS — N2581 Secondary hyperparathyroidism of renal origin: Secondary | ICD-10-CM | POA: Diagnosis not present

## 2022-05-14 DIAGNOSIS — N186 End stage renal disease: Secondary | ICD-10-CM | POA: Diagnosis not present

## 2022-05-16 DIAGNOSIS — Z992 Dependence on renal dialysis: Secondary | ICD-10-CM | POA: Diagnosis not present

## 2022-05-16 DIAGNOSIS — N186 End stage renal disease: Secondary | ICD-10-CM | POA: Diagnosis not present

## 2022-05-16 DIAGNOSIS — E1129 Type 2 diabetes mellitus with other diabetic kidney complication: Secondary | ICD-10-CM | POA: Diagnosis not present

## 2022-05-16 DIAGNOSIS — N2581 Secondary hyperparathyroidism of renal origin: Secondary | ICD-10-CM | POA: Diagnosis not present

## 2022-05-16 DIAGNOSIS — D509 Iron deficiency anemia, unspecified: Secondary | ICD-10-CM | POA: Diagnosis not present

## 2022-05-16 DIAGNOSIS — D631 Anemia in chronic kidney disease: Secondary | ICD-10-CM | POA: Diagnosis not present

## 2022-05-17 DIAGNOSIS — M14671 Charcot's joint, right ankle and foot: Secondary | ICD-10-CM | POA: Diagnosis not present

## 2022-05-18 DIAGNOSIS — E1129 Type 2 diabetes mellitus with other diabetic kidney complication: Secondary | ICD-10-CM | POA: Diagnosis not present

## 2022-05-18 DIAGNOSIS — D631 Anemia in chronic kidney disease: Secondary | ICD-10-CM | POA: Diagnosis not present

## 2022-05-18 DIAGNOSIS — N2581 Secondary hyperparathyroidism of renal origin: Secondary | ICD-10-CM | POA: Diagnosis not present

## 2022-05-18 DIAGNOSIS — Z992 Dependence on renal dialysis: Secondary | ICD-10-CM | POA: Diagnosis not present

## 2022-05-18 DIAGNOSIS — D509 Iron deficiency anemia, unspecified: Secondary | ICD-10-CM | POA: Diagnosis not present

## 2022-05-18 DIAGNOSIS — N186 End stage renal disease: Secondary | ICD-10-CM | POA: Diagnosis not present

## 2022-05-21 DIAGNOSIS — D509 Iron deficiency anemia, unspecified: Secondary | ICD-10-CM | POA: Diagnosis not present

## 2022-05-21 DIAGNOSIS — N2581 Secondary hyperparathyroidism of renal origin: Secondary | ICD-10-CM | POA: Diagnosis not present

## 2022-05-21 DIAGNOSIS — E1129 Type 2 diabetes mellitus with other diabetic kidney complication: Secondary | ICD-10-CM | POA: Diagnosis not present

## 2022-05-21 DIAGNOSIS — N186 End stage renal disease: Secondary | ICD-10-CM | POA: Diagnosis not present

## 2022-05-21 DIAGNOSIS — D631 Anemia in chronic kidney disease: Secondary | ICD-10-CM | POA: Diagnosis not present

## 2022-05-21 DIAGNOSIS — Z992 Dependence on renal dialysis: Secondary | ICD-10-CM | POA: Diagnosis not present

## 2022-05-23 DIAGNOSIS — D631 Anemia in chronic kidney disease: Secondary | ICD-10-CM | POA: Diagnosis not present

## 2022-05-23 DIAGNOSIS — N2581 Secondary hyperparathyroidism of renal origin: Secondary | ICD-10-CM | POA: Diagnosis not present

## 2022-05-23 DIAGNOSIS — Z992 Dependence on renal dialysis: Secondary | ICD-10-CM | POA: Diagnosis not present

## 2022-05-23 DIAGNOSIS — N186 End stage renal disease: Secondary | ICD-10-CM | POA: Diagnosis not present

## 2022-05-23 DIAGNOSIS — E1129 Type 2 diabetes mellitus with other diabetic kidney complication: Secondary | ICD-10-CM | POA: Diagnosis not present

## 2022-05-23 DIAGNOSIS — D509 Iron deficiency anemia, unspecified: Secondary | ICD-10-CM | POA: Diagnosis not present

## 2022-05-25 DIAGNOSIS — Z992 Dependence on renal dialysis: Secondary | ICD-10-CM | POA: Diagnosis not present

## 2022-05-25 DIAGNOSIS — D509 Iron deficiency anemia, unspecified: Secondary | ICD-10-CM | POA: Diagnosis not present

## 2022-05-25 DIAGNOSIS — E1129 Type 2 diabetes mellitus with other diabetic kidney complication: Secondary | ICD-10-CM | POA: Diagnosis not present

## 2022-05-25 DIAGNOSIS — N186 End stage renal disease: Secondary | ICD-10-CM | POA: Diagnosis not present

## 2022-05-25 DIAGNOSIS — D631 Anemia in chronic kidney disease: Secondary | ICD-10-CM | POA: Diagnosis not present

## 2022-05-25 DIAGNOSIS — N2581 Secondary hyperparathyroidism of renal origin: Secondary | ICD-10-CM | POA: Diagnosis not present

## 2022-05-27 DIAGNOSIS — Z992 Dependence on renal dialysis: Secondary | ICD-10-CM | POA: Diagnosis not present

## 2022-05-27 DIAGNOSIS — T8620 Unspecified complication of heart transplant: Secondary | ICD-10-CM | POA: Diagnosis not present

## 2022-05-27 DIAGNOSIS — N186 End stage renal disease: Secondary | ICD-10-CM | POA: Diagnosis not present

## 2022-05-28 DIAGNOSIS — N2581 Secondary hyperparathyroidism of renal origin: Secondary | ICD-10-CM | POA: Diagnosis not present

## 2022-05-28 DIAGNOSIS — Z23 Encounter for immunization: Secondary | ICD-10-CM | POA: Diagnosis not present

## 2022-05-28 DIAGNOSIS — D509 Iron deficiency anemia, unspecified: Secondary | ICD-10-CM | POA: Diagnosis not present

## 2022-05-28 DIAGNOSIS — D631 Anemia in chronic kidney disease: Secondary | ICD-10-CM | POA: Diagnosis not present

## 2022-05-28 DIAGNOSIS — E1129 Type 2 diabetes mellitus with other diabetic kidney complication: Secondary | ICD-10-CM | POA: Diagnosis not present

## 2022-05-28 DIAGNOSIS — N186 End stage renal disease: Secondary | ICD-10-CM | POA: Diagnosis not present

## 2022-05-28 DIAGNOSIS — Z992 Dependence on renal dialysis: Secondary | ICD-10-CM | POA: Diagnosis not present

## 2022-05-30 DIAGNOSIS — Z992 Dependence on renal dialysis: Secondary | ICD-10-CM | POA: Diagnosis not present

## 2022-05-30 DIAGNOSIS — N186 End stage renal disease: Secondary | ICD-10-CM | POA: Diagnosis not present

## 2022-05-30 DIAGNOSIS — D509 Iron deficiency anemia, unspecified: Secondary | ICD-10-CM | POA: Diagnosis not present

## 2022-05-30 DIAGNOSIS — D631 Anemia in chronic kidney disease: Secondary | ICD-10-CM | POA: Diagnosis not present

## 2022-05-30 DIAGNOSIS — E1129 Type 2 diabetes mellitus with other diabetic kidney complication: Secondary | ICD-10-CM | POA: Diagnosis not present

## 2022-05-30 DIAGNOSIS — N2581 Secondary hyperparathyroidism of renal origin: Secondary | ICD-10-CM | POA: Diagnosis not present

## 2022-05-31 DIAGNOSIS — M14671 Charcot's joint, right ankle and foot: Secondary | ICD-10-CM | POA: Diagnosis not present

## 2022-06-01 DIAGNOSIS — D631 Anemia in chronic kidney disease: Secondary | ICD-10-CM | POA: Diagnosis not present

## 2022-06-01 DIAGNOSIS — D509 Iron deficiency anemia, unspecified: Secondary | ICD-10-CM | POA: Diagnosis not present

## 2022-06-01 DIAGNOSIS — N2581 Secondary hyperparathyroidism of renal origin: Secondary | ICD-10-CM | POA: Diagnosis not present

## 2022-06-01 DIAGNOSIS — E1129 Type 2 diabetes mellitus with other diabetic kidney complication: Secondary | ICD-10-CM | POA: Diagnosis not present

## 2022-06-01 DIAGNOSIS — Z992 Dependence on renal dialysis: Secondary | ICD-10-CM | POA: Diagnosis not present

## 2022-06-01 DIAGNOSIS — N186 End stage renal disease: Secondary | ICD-10-CM | POA: Diagnosis not present

## 2022-06-04 DIAGNOSIS — Z992 Dependence on renal dialysis: Secondary | ICD-10-CM | POA: Diagnosis not present

## 2022-06-04 DIAGNOSIS — N186 End stage renal disease: Secondary | ICD-10-CM | POA: Diagnosis not present

## 2022-06-04 DIAGNOSIS — D631 Anemia in chronic kidney disease: Secondary | ICD-10-CM | POA: Diagnosis not present

## 2022-06-04 DIAGNOSIS — E1129 Type 2 diabetes mellitus with other diabetic kidney complication: Secondary | ICD-10-CM | POA: Diagnosis not present

## 2022-06-04 DIAGNOSIS — N2581 Secondary hyperparathyroidism of renal origin: Secondary | ICD-10-CM | POA: Diagnosis not present

## 2022-06-04 DIAGNOSIS — D509 Iron deficiency anemia, unspecified: Secondary | ICD-10-CM | POA: Diagnosis not present

## 2022-06-06 DIAGNOSIS — N186 End stage renal disease: Secondary | ICD-10-CM | POA: Diagnosis not present

## 2022-06-06 DIAGNOSIS — D509 Iron deficiency anemia, unspecified: Secondary | ICD-10-CM | POA: Diagnosis not present

## 2022-06-06 DIAGNOSIS — D631 Anemia in chronic kidney disease: Secondary | ICD-10-CM | POA: Diagnosis not present

## 2022-06-06 DIAGNOSIS — E1129 Type 2 diabetes mellitus with other diabetic kidney complication: Secondary | ICD-10-CM | POA: Diagnosis not present

## 2022-06-06 DIAGNOSIS — N2581 Secondary hyperparathyroidism of renal origin: Secondary | ICD-10-CM | POA: Diagnosis not present

## 2022-06-06 DIAGNOSIS — Z992 Dependence on renal dialysis: Secondary | ICD-10-CM | POA: Diagnosis not present

## 2022-06-08 DIAGNOSIS — D509 Iron deficiency anemia, unspecified: Secondary | ICD-10-CM | POA: Diagnosis not present

## 2022-06-08 DIAGNOSIS — E1129 Type 2 diabetes mellitus with other diabetic kidney complication: Secondary | ICD-10-CM | POA: Diagnosis not present

## 2022-06-08 DIAGNOSIS — Z992 Dependence on renal dialysis: Secondary | ICD-10-CM | POA: Diagnosis not present

## 2022-06-08 DIAGNOSIS — N186 End stage renal disease: Secondary | ICD-10-CM | POA: Diagnosis not present

## 2022-06-08 DIAGNOSIS — N2581 Secondary hyperparathyroidism of renal origin: Secondary | ICD-10-CM | POA: Diagnosis not present

## 2022-06-08 DIAGNOSIS — D631 Anemia in chronic kidney disease: Secondary | ICD-10-CM | POA: Diagnosis not present

## 2022-06-11 DIAGNOSIS — D509 Iron deficiency anemia, unspecified: Secondary | ICD-10-CM | POA: Diagnosis not present

## 2022-06-11 DIAGNOSIS — Z992 Dependence on renal dialysis: Secondary | ICD-10-CM | POA: Diagnosis not present

## 2022-06-11 DIAGNOSIS — N2581 Secondary hyperparathyroidism of renal origin: Secondary | ICD-10-CM | POA: Diagnosis not present

## 2022-06-11 DIAGNOSIS — N186 End stage renal disease: Secondary | ICD-10-CM | POA: Diagnosis not present

## 2022-06-11 DIAGNOSIS — D631 Anemia in chronic kidney disease: Secondary | ICD-10-CM | POA: Diagnosis not present

## 2022-06-11 DIAGNOSIS — E1129 Type 2 diabetes mellitus with other diabetic kidney complication: Secondary | ICD-10-CM | POA: Diagnosis not present

## 2022-06-13 DIAGNOSIS — D509 Iron deficiency anemia, unspecified: Secondary | ICD-10-CM | POA: Diagnosis not present

## 2022-06-13 DIAGNOSIS — M5416 Radiculopathy, lumbar region: Secondary | ICD-10-CM | POA: Diagnosis not present

## 2022-06-13 DIAGNOSIS — Z992 Dependence on renal dialysis: Secondary | ICD-10-CM | POA: Diagnosis not present

## 2022-06-13 DIAGNOSIS — E1129 Type 2 diabetes mellitus with other diabetic kidney complication: Secondary | ICD-10-CM | POA: Diagnosis not present

## 2022-06-13 DIAGNOSIS — N2581 Secondary hyperparathyroidism of renal origin: Secondary | ICD-10-CM | POA: Diagnosis not present

## 2022-06-13 DIAGNOSIS — D631 Anemia in chronic kidney disease: Secondary | ICD-10-CM | POA: Diagnosis not present

## 2022-06-13 DIAGNOSIS — N186 End stage renal disease: Secondary | ICD-10-CM | POA: Diagnosis not present

## 2022-06-14 DIAGNOSIS — Z79899 Other long term (current) drug therapy: Secondary | ICD-10-CM | POA: Diagnosis not present

## 2022-06-15 DIAGNOSIS — D631 Anemia in chronic kidney disease: Secondary | ICD-10-CM | POA: Diagnosis not present

## 2022-06-15 DIAGNOSIS — E1129 Type 2 diabetes mellitus with other diabetic kidney complication: Secondary | ICD-10-CM | POA: Diagnosis not present

## 2022-06-15 DIAGNOSIS — N2581 Secondary hyperparathyroidism of renal origin: Secondary | ICD-10-CM | POA: Diagnosis not present

## 2022-06-15 DIAGNOSIS — M14671 Charcot's joint, right ankle and foot: Secondary | ICD-10-CM | POA: Diagnosis not present

## 2022-06-15 DIAGNOSIS — D509 Iron deficiency anemia, unspecified: Secondary | ICD-10-CM | POA: Diagnosis not present

## 2022-06-15 DIAGNOSIS — Z992 Dependence on renal dialysis: Secondary | ICD-10-CM | POA: Diagnosis not present

## 2022-06-15 DIAGNOSIS — N186 End stage renal disease: Secondary | ICD-10-CM | POA: Diagnosis not present

## 2022-06-18 DIAGNOSIS — E1129 Type 2 diabetes mellitus with other diabetic kidney complication: Secondary | ICD-10-CM | POA: Diagnosis not present

## 2022-06-18 DIAGNOSIS — D509 Iron deficiency anemia, unspecified: Secondary | ICD-10-CM | POA: Diagnosis not present

## 2022-06-18 DIAGNOSIS — Z992 Dependence on renal dialysis: Secondary | ICD-10-CM | POA: Diagnosis not present

## 2022-06-18 DIAGNOSIS — N186 End stage renal disease: Secondary | ICD-10-CM | POA: Diagnosis not present

## 2022-06-18 DIAGNOSIS — D631 Anemia in chronic kidney disease: Secondary | ICD-10-CM | POA: Diagnosis not present

## 2022-06-18 DIAGNOSIS — N2581 Secondary hyperparathyroidism of renal origin: Secondary | ICD-10-CM | POA: Diagnosis not present

## 2022-06-20 DIAGNOSIS — N2581 Secondary hyperparathyroidism of renal origin: Secondary | ICD-10-CM | POA: Diagnosis not present

## 2022-06-20 DIAGNOSIS — Z992 Dependence on renal dialysis: Secondary | ICD-10-CM | POA: Diagnosis not present

## 2022-06-20 DIAGNOSIS — D631 Anemia in chronic kidney disease: Secondary | ICD-10-CM | POA: Diagnosis not present

## 2022-06-20 DIAGNOSIS — E1129 Type 2 diabetes mellitus with other diabetic kidney complication: Secondary | ICD-10-CM | POA: Diagnosis not present

## 2022-06-20 DIAGNOSIS — N186 End stage renal disease: Secondary | ICD-10-CM | POA: Diagnosis not present

## 2022-06-20 DIAGNOSIS — D509 Iron deficiency anemia, unspecified: Secondary | ICD-10-CM | POA: Diagnosis not present

## 2022-06-22 DIAGNOSIS — D509 Iron deficiency anemia, unspecified: Secondary | ICD-10-CM | POA: Diagnosis not present

## 2022-06-22 DIAGNOSIS — Z992 Dependence on renal dialysis: Secondary | ICD-10-CM | POA: Diagnosis not present

## 2022-06-22 DIAGNOSIS — E1129 Type 2 diabetes mellitus with other diabetic kidney complication: Secondary | ICD-10-CM | POA: Diagnosis not present

## 2022-06-22 DIAGNOSIS — N186 End stage renal disease: Secondary | ICD-10-CM | POA: Diagnosis not present

## 2022-06-22 DIAGNOSIS — D631 Anemia in chronic kidney disease: Secondary | ICD-10-CM | POA: Diagnosis not present

## 2022-06-22 DIAGNOSIS — N2581 Secondary hyperparathyroidism of renal origin: Secondary | ICD-10-CM | POA: Diagnosis not present

## 2022-06-24 ENCOUNTER — Emergency Department (HOSPITAL_COMMUNITY)
Admission: EM | Admit: 2022-06-24 | Discharge: 2022-06-24 | Disposition: A | Discharge status: Home / Self Care | Payer: Medicare Other | Attending: Emergency Medicine | Admitting: Emergency Medicine

## 2022-06-24 ENCOUNTER — Emergency Department (HOSPITAL_COMMUNITY): Payer: Medicare Other

## 2022-06-24 ENCOUNTER — Other Ambulatory Visit: Payer: Self-pay

## 2022-06-24 ENCOUNTER — Ambulatory Visit (HOSPITAL_COMMUNITY)
Admission: EM | Admit: 2022-06-24 | Discharge: 2022-06-24 | Payer: Medicare Other | Attending: Family Medicine | Admitting: Family Medicine

## 2022-06-24 DIAGNOSIS — Z7901 Long term (current) use of anticoagulants: Secondary | ICD-10-CM | POA: Diagnosis not present

## 2022-06-24 DIAGNOSIS — M549 Dorsalgia, unspecified: Secondary | ICD-10-CM | POA: Diagnosis not present

## 2022-06-24 DIAGNOSIS — W109XXA Fall (on) (from) unspecified stairs and steps, initial encounter: Secondary | ICD-10-CM | POA: Diagnosis not present

## 2022-06-24 DIAGNOSIS — M542 Cervicalgia: Secondary | ICD-10-CM | POA: Diagnosis not present

## 2022-06-24 DIAGNOSIS — S0990XA Unspecified injury of head, initial encounter: Secondary | ICD-10-CM | POA: Insufficient documentation

## 2022-06-24 DIAGNOSIS — Z8673 Personal history of transient ischemic attack (TIA), and cerebral infarction without residual deficits: Secondary | ICD-10-CM | POA: Diagnosis not present

## 2022-06-24 DIAGNOSIS — N186 End stage renal disease: Secondary | ICD-10-CM | POA: Insufficient documentation

## 2022-06-24 DIAGNOSIS — S2231XA Fracture of one rib, right side, initial encounter for closed fracture: Secondary | ICD-10-CM | POA: Diagnosis not present

## 2022-06-24 DIAGNOSIS — I120 Hypertensive chronic kidney disease with stage 5 chronic kidney disease or end stage renal disease: Secondary | ICD-10-CM | POA: Diagnosis not present

## 2022-06-24 DIAGNOSIS — M19011 Primary osteoarthritis, right shoulder: Secondary | ICD-10-CM | POA: Diagnosis not present

## 2022-06-24 DIAGNOSIS — E1122 Type 2 diabetes mellitus with diabetic chronic kidney disease: Secondary | ICD-10-CM | POA: Insufficient documentation

## 2022-06-24 DIAGNOSIS — M4312 Spondylolisthesis, cervical region: Secondary | ICD-10-CM | POA: Diagnosis not present

## 2022-06-24 DIAGNOSIS — J9811 Atelectasis: Secondary | ICD-10-CM | POA: Diagnosis not present

## 2022-06-24 DIAGNOSIS — Z992 Dependence on renal dialysis: Secondary | ICD-10-CM | POA: Diagnosis not present

## 2022-06-24 DIAGNOSIS — Z8546 Personal history of malignant neoplasm of prostate: Secondary | ICD-10-CM | POA: Insufficient documentation

## 2022-06-24 DIAGNOSIS — I700 Atherosclerosis of aorta: Secondary | ICD-10-CM | POA: Diagnosis not present

## 2022-06-24 DIAGNOSIS — R079 Chest pain, unspecified: Secondary | ICD-10-CM | POA: Diagnosis not present

## 2022-06-24 DIAGNOSIS — W19XXXA Unspecified fall, initial encounter: Secondary | ICD-10-CM

## 2022-06-24 DIAGNOSIS — Z941 Heart transplant status: Secondary | ICD-10-CM | POA: Diagnosis not present

## 2022-06-24 DIAGNOSIS — Z79899 Other long term (current) drug therapy: Secondary | ICD-10-CM | POA: Insufficient documentation

## 2022-06-24 DIAGNOSIS — M25511 Pain in right shoulder: Secondary | ICD-10-CM | POA: Diagnosis present

## 2022-06-24 LAB — CBC
HCT: 32.3 % — ABNORMAL LOW (ref 39.0–52.0)
Hemoglobin: 10.9 g/dL — ABNORMAL LOW (ref 13.0–17.0)
MCH: 30.7 pg (ref 26.0–34.0)
MCHC: 33.7 g/dL (ref 30.0–36.0)
MCV: 91 fL (ref 80.0–100.0)
Platelets: 192 10*3/uL (ref 150–400)
RBC: 3.55 MIL/uL — ABNORMAL LOW (ref 4.22–5.81)
RDW: 15.3 % (ref 11.5–15.5)
WBC: 11.1 10*3/uL — ABNORMAL HIGH (ref 4.0–10.5)
nRBC: 0 % (ref 0.0–0.2)

## 2022-06-24 LAB — LACTIC ACID, PLASMA: Lactic Acid, Venous: 1.7 mmol/L (ref 0.5–1.9)

## 2022-06-24 LAB — PROTIME-INR
INR: 1.2 (ref 0.8–1.2)
Prothrombin Time: 15.3 seconds — ABNORMAL HIGH (ref 11.4–15.2)

## 2022-06-24 LAB — ETHANOL: Alcohol, Ethyl (B): <10 mg/dL (ref <10)

## 2022-06-24 MED ORDER — LIDOCAINE 5 % EX PTCH
1.0000 | MEDICATED_PATCH | CUTANEOUS | Status: DC
  Administered 2022-06-24: 1 via TRANSDERMAL
  Filled 2022-06-24: qty 1

## 2022-06-24 MED ORDER — METHOCARBAMOL 500 MG PO TABS
500.0000 mg | ORAL_TABLET | Freq: Once | ORAL | Status: AC
  Administered 2022-06-24: 500 mg via ORAL
  Filled 2022-06-24: qty 1

## 2022-06-24 MED ORDER — OXYCODONE-ACETAMINOPHEN 5-325 MG PO TABS
1.0000 | ORAL_TABLET | Freq: Once | ORAL | Status: AC
  Administered 2022-06-24: 1 via ORAL
  Filled 2022-06-24: qty 1

## 2022-06-24 MED ORDER — METHOCARBAMOL 500 MG PO TABS: 500.0000 mg | ORAL_TABLET | Freq: Three times a day (TID) | ORAL | 0 refills | Status: DC | PRN

## 2022-06-24 MED ORDER — LIDOCAINE 5 % EX PTCH: 1.0000 | MEDICATED_PATCH | CUTANEOUS | 0 refills | Status: DC

## 2022-06-24 NOTE — ED Provider Notes (Signed)
Patient seen in triage.  Yesterday he fell backwards off stairs and hit his head on a hard surface.  It did not cause a loss of consciousness.  Today he is having some headache and he is fatigued.  He also feels short of breath.  No nausea or vomiting.  Also he is a transplant patient and is immunocompromise.  I have asked him and his daughter to proceed to the emergency room for higher level of care and evaluation then we can provide for this person in the urgent care setting   Barrett Henle, MD 06/24/22 1811

## 2022-06-24 NOTE — ED Provider Notes (Signed)
[facility] EMERGENCY DEPARTMENT Provider Note   CSN: [id number] Arrival date & time: 06/24/22  1833     History {Add pertinent medical, surgical, social history, OB history to HPI:1} Chief Complaint  Patient presents with   [name] is a 61 y.o. male.   Fall  Patient presents after a fall.  Medical history includes arthritis, heart transplant in 2014, DM, HLD, OSA, HTN, obesity, ESRD (on HD), prostate cancer, CVA, neuropathy.  He undergoes dialysis on Monday, Wednesday, Friday.  He did get a full session on Friday.  Patient's family states that he will often feel weak after dialysis sessions.  He has had ongoing low blood pressure and does take midodrine daily for this.  He increases his dose on dialysis days.  Patient seem to be weaker than normal yesterday.  He sat in the car while his daughter ran some errands.  When they got home, he sat on the garage for several hours.  He eventually got up to strength to walk into the house.  As he was traversing the stairs to come into the house, he got to the top and seems to have his leg buckled under him.  It appeared that he attempted to sit down but, because of the stairs directly behind him, he fell down the stairs, landing at the bottom of them on his back.  He did strike the back of his head.  He did not lose consciousness.  He was able to be helped to a sitting position and ultimately stood up.  Since the fall, he has endorsed pain in the posterior right shoulder area.  He is also endorsed some shortness of breath.  He is on Eliquis.      Home Medications Prior to Admission medications   Medication Sig Start Date End Date Taking? Authorizing Provider  B Complex-C-Folic Acid (RENO CAPS) 1 MG CAPS Take 1 mg by mouth daily. 08/09/17   [provider]  cinacalcet (SENSIPAR) 60 MG tablet Take 60 mg by mouth daily. 04/19/21   [provider]  citalopram (CELEXA) 20 MG tablet Take 20 mg by mouth  daily. 04/06/20   [provider]  cycloSPORINE modified (NEORAL) 100 MG capsule Take 100 mg by mouth See admin instructions. Take with three 25 mg  (75 ) for a total of 175 mg twice a day    [provider]  cycloSPORINE modified (NEORAL) 25 MG capsule Take 75 mg by mouth See admin instructions. Take With 100 mg tab for a total of 175 mg twice a day    [provider]  ELIQUIS 2.5 MG TABS tablet Take 1 tablet (2.5 mg total) by mouth 2 (two) times daily. 04/30/21   [name], MD  lidocaine (XYLOCAINE) 5 % ointment Apply 1 application topically as needed. 04/14/19   Jamse Arn, MD  lidocaine-prilocaine (EMLA) cream Apply 1 application topically as needed.    [provider]  midodrine (PROAMATINE) 10 MG tablet Take 1 tablet (10 mg total) by mouth 3 (three) times daily. 04/27/21   [name], MD  mycophenolate (MYFORTIC) 180 MG EC tablet Take 720 mg by mouth 2 (two) times daily.    [provider]  ondansetron (ZOFRAN) 4 MG tablet Take 1 tablet (4 mg total) by mouth every 6 (six) hours as needed for nausea. 07/23/17   Swinteck, Aaron Edelman, MD  OXcarbazepine (TRILEPTAL) 150 MG tablet Take 1 tablet (150 mg total) by mouth 2 (two)  times daily. 10/12/21   Marcial Pacas, MD  oxybutynin (DITROPAN) 5 MG tablet Take 1 tablet (5 mg total) by mouth every 8 (eight) hours as needed for bladder spasms. 04/23/21   Davonna Belling, MD  pregabalin (LYRICA) 25 MG capsule TAKE 5 CAPSULES BY MOUTH AFTER DIALYSIS 06/09/20   Jamse Arn, MD  pregabalin (LYRICA) 75 MG capsule TAKE 1 CAPSULE(75 MG) BY MOUTH DAILY Patient taking differently: Take 100 mg by mouth daily. 07/12/20   Jamse Arn, MD  sevelamer carbonate (RENVELA) 800 MG tablet Take 2 tablets (1,600 mg total) by mouth 3 (three) times daily with meals. 11/11/18   Kayleen Memos, DO      Allergies    Heparin and Penicillin g potassium [penicillin g]    Review of Systems   Review of  Systems  Physical Exam Updated Vital Signs BP (!) 87/62   Pulse 89   Temp 98.5 F (36.9 C)   Resp 18   SpO2 95%  Physical Exam  ED Results / Procedures / Treatments   Labs (all labs ordered are listed, but only abnormal results are displayed) Labs Reviewed  COMPREHENSIVE METABOLIC PANEL  CBC  URINALYSIS, ROUTINE W REFLEX MICROSCOPIC  LACTIC ACID, PLASMA  PROTIME-INR  ETHANOL  I-STAT CHEM 8, ED    EKG None  Radiology No results found.  Procedures Procedures  {Document cardiac monitor, telemetry assessment procedure when appropriate:1}  Medications Ordered in ED Medications - No data to display  ED Course/ Medical Decision Making/ A&P                           Medical Decision Making  ***  {Document critical care time when appropriate:1} {Document review of labs and clinical decision tools ie heart score, Chads2Vasc2 etc:1}  {Document your independent review of radiology images, and any outside records:1} {Document your discussion with family members, caretakers, and with consultants:1} {Document social determinants of health affecting pt's care:1} {Document your decision making why or why not admission, treatments were needed:1} Final Clinical Impression(s) / ED Diagnoses Final diagnoses:  None    Rx / DC Orders ED Discharge Orders     None

## 2022-06-24 NOTE — Discharge Instructions (Addendum)
You have 1 broken rib on the right side.  This will continue to be painful.  Your goal is to control the pain so that you are able to continue taking deep breaths and not develop a pneumonia.  You can control your pain with multimodal pain control.  Take your oxycodone at home as needed.  Additional medications were sent to your pharmacy: Lidocaine patches are effective throughout the day.  You can change these out daily.  Avoid having more than 1 patch on your skin at any given time.  Methocarbamol is a muscle relaxer that can also help with pain.  Take this as needed.  Be advised that taking narcotic medications and muscle relaxers can put you at increased risk of falls.  Take these medications with caution.  Return to the emergency department at any time for any new or worsening symptoms of concern.

## 2022-06-24 NOTE — ED Triage Notes (Signed)
Patient fell going up the steps yesterday was mid way up the stairs so fell down 4+. [address] on his back and hit his head.   No bleeding, nausea, vomiting, or LOC. Patient fell due to impaired gait and weakness.   No blurred vision or dizziness. Patient's current range of motion is below his baseline.   Patient feeling SOB. States he feels like he got done exercising.   Patient has history of multiple breaks and bone degeneration in th right foot. Currently has on post op shoe.

## 2022-06-24 NOTE — ED Notes (Signed)
Agricultural consultant and Welcome PA aware of pt's BP.

## 2022-06-24 NOTE — ED Triage Notes (Addendum)
Pt here from home w/ daughter for fall last night. Pt fell backwards down 6 steps, landed on back, hit posterior head. Pt is on eliquis. Pt denies LOC. Pt has R foot injury and is on crutches, states he got "tripped up" and fell. Pt c/o R shoulder pain. Hx heart transplant in 2014, dialysis compliant.

## 2022-06-24 NOTE — ED Notes (Signed)
Patient is being discharged from the Urgent Care and sent to the Emergency Department via personal vehicle. Per Dr Windy Carina, patient is in need of higher level of care due to fall and severe weakness. Patient is aware and verbalizes understanding of plan of care.  Vitals:   06/24/22 1802  BP: 117/64  Pulse: 95  Resp: 16  SpO2: 98%

## 2022-06-25 DIAGNOSIS — Z992 Dependence on renal dialysis: Secondary | ICD-10-CM | POA: Diagnosis not present

## 2022-06-25 DIAGNOSIS — N2581 Secondary hyperparathyroidism of renal origin: Secondary | ICD-10-CM | POA: Diagnosis not present

## 2022-06-25 DIAGNOSIS — E1129 Type 2 diabetes mellitus with other diabetic kidney complication: Secondary | ICD-10-CM | POA: Diagnosis not present

## 2022-06-25 DIAGNOSIS — N186 End stage renal disease: Secondary | ICD-10-CM | POA: Diagnosis not present

## 2022-06-25 DIAGNOSIS — D631 Anemia in chronic kidney disease: Secondary | ICD-10-CM | POA: Diagnosis not present

## 2022-06-25 DIAGNOSIS — D509 Iron deficiency anemia, unspecified: Secondary | ICD-10-CM | POA: Diagnosis not present

## 2022-06-26 DIAGNOSIS — M5416 Radiculopathy, lumbar region: Secondary | ICD-10-CM | POA: Diagnosis not present

## 2022-06-26 LAB — MYCOPHENOLIC ACID (CELLCEPT)
MPA Glucuronide: 329 ug/mL — ABNORMAL HIGH (ref 15–125)
MPA: 1.4 ug/mL (ref 1.0–3.5)

## 2022-06-27 DIAGNOSIS — N186 End stage renal disease: Secondary | ICD-10-CM | POA: Diagnosis not present

## 2022-06-27 DIAGNOSIS — Z992 Dependence on renal dialysis: Secondary | ICD-10-CM | POA: Diagnosis not present

## 2022-06-27 DIAGNOSIS — E1129 Type 2 diabetes mellitus with other diabetic kidney complication: Secondary | ICD-10-CM | POA: Diagnosis not present

## 2022-06-27 DIAGNOSIS — D509 Iron deficiency anemia, unspecified: Secondary | ICD-10-CM | POA: Diagnosis not present

## 2022-06-27 DIAGNOSIS — N2581 Secondary hyperparathyroidism of renal origin: Secondary | ICD-10-CM | POA: Diagnosis not present

## 2022-06-27 DIAGNOSIS — T8620 Unspecified complication of heart transplant: Secondary | ICD-10-CM | POA: Diagnosis not present

## 2022-06-27 DIAGNOSIS — D631 Anemia in chronic kidney disease: Secondary | ICD-10-CM | POA: Diagnosis not present

## 2022-06-28 DIAGNOSIS — M14671 Charcot's joint, right ankle and foot: Secondary | ICD-10-CM | POA: Diagnosis not present

## 2022-06-29 DIAGNOSIS — E1129 Type 2 diabetes mellitus with other diabetic kidney complication: Secondary | ICD-10-CM | POA: Diagnosis not present

## 2022-06-29 DIAGNOSIS — Z992 Dependence on renal dialysis: Secondary | ICD-10-CM | POA: Diagnosis not present

## 2022-06-29 DIAGNOSIS — N2581 Secondary hyperparathyroidism of renal origin: Secondary | ICD-10-CM | POA: Diagnosis not present

## 2022-06-29 DIAGNOSIS — D509 Iron deficiency anemia, unspecified: Secondary | ICD-10-CM | POA: Diagnosis not present

## 2022-06-29 DIAGNOSIS — D631 Anemia in chronic kidney disease: Secondary | ICD-10-CM | POA: Diagnosis not present

## 2022-06-29 DIAGNOSIS — N186 End stage renal disease: Secondary | ICD-10-CM | POA: Diagnosis not present

## 2022-07-02 DIAGNOSIS — D509 Iron deficiency anemia, unspecified: Secondary | ICD-10-CM | POA: Diagnosis not present

## 2022-07-02 DIAGNOSIS — N186 End stage renal disease: Secondary | ICD-10-CM | POA: Diagnosis not present

## 2022-07-02 DIAGNOSIS — E1129 Type 2 diabetes mellitus with other diabetic kidney complication: Secondary | ICD-10-CM | POA: Diagnosis not present

## 2022-07-02 DIAGNOSIS — Z992 Dependence on renal dialysis: Secondary | ICD-10-CM | POA: Diagnosis not present

## 2022-07-02 DIAGNOSIS — N2581 Secondary hyperparathyroidism of renal origin: Secondary | ICD-10-CM | POA: Diagnosis not present

## 2022-07-02 DIAGNOSIS — D631 Anemia in chronic kidney disease: Secondary | ICD-10-CM | POA: Diagnosis not present

## 2022-07-04 ENCOUNTER — Emergency Department (HOSPITAL_COMMUNITY): Payer: Medicare Other

## 2022-07-04 ENCOUNTER — Observation Stay (HOSPITAL_COMMUNITY): Payer: Medicare Other

## 2022-07-04 ENCOUNTER — Observation Stay (HOSPITAL_COMMUNITY)
Admission: EM | Admit: 2022-07-04 | Discharge: 2022-07-06 | Disposition: A | Discharge status: Home Health | Payer: Medicare Other | Attending: Family Medicine | Admitting: Family Medicine

## 2022-07-04 ENCOUNTER — Encounter (HOSPITAL_COMMUNITY): Payer: Self-pay | Admitting: Internal Medicine

## 2022-07-04 ENCOUNTER — Other Ambulatory Visit: Payer: Self-pay

## 2022-07-04 DIAGNOSIS — E1169 Type 2 diabetes mellitus with other specified complication: Secondary | ICD-10-CM

## 2022-07-04 DIAGNOSIS — S2241XA Multiple fractures of ribs, right side, initial encounter for closed fracture: Secondary | ICD-10-CM | POA: Diagnosis not present

## 2022-07-04 DIAGNOSIS — R410 Disorientation, unspecified: Secondary | ICD-10-CM | POA: Diagnosis not present

## 2022-07-04 DIAGNOSIS — R450 Nervousness: Secondary | ICD-10-CM | POA: Diagnosis not present

## 2022-07-04 DIAGNOSIS — Z96651 Presence of right artificial knee joint: Secondary | ICD-10-CM | POA: Diagnosis not present

## 2022-07-04 DIAGNOSIS — E1140 Type 2 diabetes mellitus with diabetic neuropathy, unspecified: Secondary | ICD-10-CM | POA: Diagnosis not present

## 2022-07-04 DIAGNOSIS — Z79899 Other long term (current) drug therapy: Secondary | ICD-10-CM | POA: Diagnosis not present

## 2022-07-04 DIAGNOSIS — N186 End stage renal disease: Secondary | ICD-10-CM

## 2022-07-04 DIAGNOSIS — E66813 Obesity, class 3: Secondary | ICD-10-CM | POA: Diagnosis present

## 2022-07-04 DIAGNOSIS — T402X1A Poisoning by other opioids, accidental (unintentional), initial encounter: Principal | ICD-10-CM | POA: Insufficient documentation

## 2022-07-04 DIAGNOSIS — C61 Malignant neoplasm of prostate: Secondary | ICD-10-CM | POA: Diagnosis not present

## 2022-07-04 DIAGNOSIS — G9340 Encephalopathy, unspecified: Secondary | ICD-10-CM | POA: Diagnosis not present

## 2022-07-04 DIAGNOSIS — Z1152 Encounter for screening for COVID-19: Secondary | ICD-10-CM | POA: Diagnosis not present

## 2022-07-04 DIAGNOSIS — E785 Hyperlipidemia, unspecified: Secondary | ICD-10-CM | POA: Diagnosis not present

## 2022-07-04 DIAGNOSIS — I4510 Unspecified right bundle-branch block: Secondary | ICD-10-CM | POA: Diagnosis not present

## 2022-07-04 DIAGNOSIS — R4182 Altered mental status, unspecified: Secondary | ICD-10-CM | POA: Diagnosis not present

## 2022-07-04 DIAGNOSIS — G4733 Obstructive sleep apnea (adult) (pediatric): Secondary | ICD-10-CM | POA: Diagnosis present

## 2022-07-04 DIAGNOSIS — Z87891 Personal history of nicotine dependence: Secondary | ICD-10-CM | POA: Diagnosis not present

## 2022-07-04 DIAGNOSIS — B974 Respiratory syncytial virus as the cause of diseases classified elsewhere: Secondary | ICD-10-CM | POA: Insufficient documentation

## 2022-07-04 DIAGNOSIS — R079 Chest pain, unspecified: Secondary | ICD-10-CM | POA: Diagnosis not present

## 2022-07-04 DIAGNOSIS — T40605A Adverse effect of unspecified narcotics, initial encounter: Secondary | ICD-10-CM | POA: Diagnosis not present

## 2022-07-04 DIAGNOSIS — Z7901 Long term (current) use of anticoagulants: Secondary | ICD-10-CM | POA: Diagnosis not present

## 2022-07-04 DIAGNOSIS — Z941 Heart transplant status: Secondary | ICD-10-CM

## 2022-07-04 DIAGNOSIS — J069 Acute upper respiratory infection, unspecified: Secondary | ICD-10-CM | POA: Insufficient documentation

## 2022-07-04 DIAGNOSIS — Z8673 Personal history of transient ischemic attack (TIA), and cerebral infarction without residual deficits: Secondary | ICD-10-CM

## 2022-07-04 DIAGNOSIS — E669 Obesity, unspecified: Secondary | ICD-10-CM | POA: Diagnosis not present

## 2022-07-04 DIAGNOSIS — G6289 Other specified polyneuropathies: Secondary | ICD-10-CM

## 2022-07-04 DIAGNOSIS — D631 Anemia in chronic kidney disease: Secondary | ICD-10-CM | POA: Diagnosis not present

## 2022-07-04 DIAGNOSIS — I509 Heart failure, unspecified: Secondary | ICD-10-CM | POA: Insufficient documentation

## 2022-07-04 DIAGNOSIS — Z950 Presence of cardiac pacemaker: Secondary | ICD-10-CM | POA: Insufficient documentation

## 2022-07-04 DIAGNOSIS — I4891 Unspecified atrial fibrillation: Secondary | ICD-10-CM | POA: Diagnosis present

## 2022-07-04 DIAGNOSIS — I120 Hypertensive chronic kidney disease with stage 5 chronic kidney disease or end stage renal disease: Secondary | ICD-10-CM | POA: Diagnosis not present

## 2022-07-04 DIAGNOSIS — N2581 Secondary hyperparathyroidism of renal origin: Secondary | ICD-10-CM | POA: Diagnosis not present

## 2022-07-04 DIAGNOSIS — I132 Hypertensive heart and chronic kidney disease with heart failure and with stage 5 chronic kidney disease, or end stage renal disease: Secondary | ICD-10-CM | POA: Diagnosis not present

## 2022-07-04 DIAGNOSIS — M47812 Spondylosis without myelopathy or radiculopathy, cervical region: Secondary | ICD-10-CM | POA: Diagnosis not present

## 2022-07-04 DIAGNOSIS — Z8546 Personal history of malignant neoplasm of prostate: Secondary | ICD-10-CM | POA: Diagnosis not present

## 2022-07-04 DIAGNOSIS — G629 Polyneuropathy, unspecified: Secondary | ICD-10-CM

## 2022-07-04 DIAGNOSIS — I10 Essential (primary) hypertension: Secondary | ICD-10-CM | POA: Diagnosis present

## 2022-07-04 DIAGNOSIS — M19071 Primary osteoarthritis, right ankle and foot: Secondary | ICD-10-CM | POA: Diagnosis not present

## 2022-07-04 DIAGNOSIS — Z992 Dependence on renal dialysis: Secondary | ICD-10-CM | POA: Diagnosis not present

## 2022-07-04 DIAGNOSIS — D509 Iron deficiency anemia, unspecified: Secondary | ICD-10-CM | POA: Diagnosis not present

## 2022-07-04 DIAGNOSIS — W19XXXA Unspecified fall, initial encounter: Secondary | ICD-10-CM | POA: Insufficient documentation

## 2022-07-04 DIAGNOSIS — E1129 Type 2 diabetes mellitus with other diabetic kidney complication: Secondary | ICD-10-CM | POA: Diagnosis not present

## 2022-07-04 DIAGNOSIS — G928 Other toxic encephalopathy: Secondary | ICD-10-CM | POA: Diagnosis not present

## 2022-07-04 DIAGNOSIS — N281 Cyst of kidney, acquired: Secondary | ICD-10-CM | POA: Diagnosis not present

## 2022-07-04 DIAGNOSIS — M4802 Spinal stenosis, cervical region: Secondary | ICD-10-CM | POA: Diagnosis not present

## 2022-07-04 DIAGNOSIS — Z96641 Presence of right artificial hip joint: Secondary | ICD-10-CM | POA: Diagnosis not present

## 2022-07-04 DIAGNOSIS — Z043 Encounter for examination and observation following other accident: Secondary | ICD-10-CM | POA: Diagnosis not present

## 2022-07-04 DIAGNOSIS — E1142 Type 2 diabetes mellitus with diabetic polyneuropathy: Secondary | ICD-10-CM | POA: Diagnosis present

## 2022-07-04 LAB — LACTIC ACID, PLASMA: Lactic Acid, Venous: 1.6 mmol/L (ref 0.5–1.9)

## 2022-07-04 LAB — CBC WITH DIFFERENTIAL/PLATELET
Abs Immature Granulocytes: 0.04 10*3/uL (ref 0.00–0.07)
Basophils Absolute: 0.1 10*3/uL (ref 0.0–0.1)
Basophils Relative: 1 %
Eosinophils Absolute: 0.3 10*3/uL (ref 0.0–0.5)
Eosinophils Relative: 3 %
HCT: 30.5 % — ABNORMAL LOW (ref 39.0–52.0)
Hemoglobin: 9.7 g/dL — ABNORMAL LOW (ref 13.0–17.0)
Immature Granulocytes: 0 %
Lymphocytes Relative: 7 %
Lymphs Abs: 0.7 10*3/uL (ref 0.7–4.0)
MCH: 29.3 pg (ref 26.0–34.0)
MCHC: 31.8 g/dL (ref 30.0–36.0)
MCV: 92.1 fL (ref 80.0–100.0)
Monocytes Absolute: 1 10*3/uL (ref 0.1–1.0)
Monocytes Relative: 11 %
Neutro Abs: 7.5 10*3/uL (ref 1.7–7.7)
Neutrophils Relative %: 78 %
Platelets: 196 10*3/uL (ref 150–400)
RBC: 3.31 MIL/uL — ABNORMAL LOW (ref 4.22–5.81)
RDW: 15.6 % — ABNORMAL HIGH (ref 11.5–15.5)
WBC: 9.6 10*3/uL (ref 4.0–10.5)
nRBC: 0 % (ref 0.0–0.2)

## 2022-07-04 LAB — SARS CORONAVIRUS 2 BY RT PCR: SARS Coronavirus 2 by RT PCR: NEGATIVE

## 2022-07-04 LAB — RESPIRATORY PANEL BY PCR

## 2022-07-04 LAB — COMPREHENSIVE METABOLIC PANEL
ALT: 21 U/L (ref 0–44)
AST: 18 U/L (ref 15–41)
Albumin: 3.1 g/dL — ABNORMAL LOW (ref 3.5–5.0)
Alkaline Phosphatase: 87 U/L (ref 38–126)
Anion gap: 19 — ABNORMAL HIGH (ref 5–15)
BUN: 23 mg/dL (ref 8–23)
CO2: 29 mmol/L (ref 22–32)
Calcium: 9.2 mg/dL (ref 8.9–10.3)
Chloride: 92 mmol/L — ABNORMAL LOW (ref 98–111)
Creatinine, Ser: 7.1 mg/dL — ABNORMAL HIGH (ref 0.61–1.24)
GFR, Estimated: 8 mL/min — ABNORMAL LOW (ref >60)
Glucose, Bld: 113 mg/dL — ABNORMAL HIGH (ref 70–99)
Potassium: 3.8 mmol/L (ref 3.5–5.1)
Sodium: 140 mmol/L (ref 135–145)
Total Bilirubin: 1.9 mg/dL — ABNORMAL HIGH (ref 0.3–1.2)
Total Protein: 7.7 g/dL (ref 6.5–8.1)

## 2022-07-04 LAB — CBG MONITORING, ED
Glucose-Capillary: 114 mg/dL — ABNORMAL HIGH (ref 70–99)
Glucose-Capillary: 98 mg/dL (ref 70–99)

## 2022-07-04 LAB — HIV ANTIBODY (ROUTINE TESTING W REFLEX): HIV Screen 4th Generation wRfx: NONREACTIVE

## 2022-07-04 MED ORDER — CYCLOSPORINE MODIFIED (NEORAL) 100 MG PO CAPS: 100.0000 mg | ORAL_CAPSULE | ORAL | Status: DC

## 2022-07-04 MED ORDER — HYDROMORPHONE HCL 1 MG/ML IJ SOLN
0.5000 mg | INTRAMUSCULAR | Status: DC | PRN
  Administered 2022-07-04: 0.5 mg via INTRAVENOUS
  Filled 2022-07-04: qty 1

## 2022-07-04 MED ORDER — POLYETHYLENE GLYCOL 3350 17 G PO PACK: 17.0000 g | PACK | Freq: Every day | ORAL | Status: DC | PRN

## 2022-07-04 MED ORDER — ACETAMINOPHEN 650 MG RE SUPP: 650.0000 mg | Freq: Four times a day (QID) | RECTAL | Status: DC | PRN

## 2022-07-04 MED ORDER — MYCOPHENOLATE SODIUM 180 MG PO TBEC
720.0000 mg | DELAYED_RELEASE_TABLET | Freq: Two times a day (BID) | ORAL | Status: DC
  Administered 2022-07-04 – 2022-07-05 (×3): 720 mg via ORAL
  Filled 2022-07-04 (×5): qty 4

## 2022-07-04 MED ORDER — INSULIN ASPART 100 UNIT/ML IJ SOLN: 0.0000 [IU] | Freq: Three times a day (TID) | INTRAMUSCULAR | Status: DC

## 2022-07-04 MED ORDER — OXYCODONE HCL 5 MG PO TABS: 5.0000 mg | ORAL_TABLET | ORAL | Status: DC | PRN

## 2022-07-04 MED ORDER — CYCLOSPORINE 25 MG PO CAPS
175.0000 mg | ORAL_CAPSULE | Freq: Two times a day (BID) | ORAL | Status: DC
  Administered 2022-07-04 – 2022-07-05 (×3): 175 mg via ORAL
  Filled 2022-07-04 (×6): qty 3

## 2022-07-04 MED ORDER — SODIUM CHLORIDE 0.9% FLUSH
3.0000 mL | Freq: Two times a day (BID) | INTRAVENOUS | Status: DC
  Administered 2022-07-04 – 2022-07-05 (×2): 3 mL via INTRAVENOUS

## 2022-07-04 MED ORDER — ACETAMINOPHEN 325 MG PO TABS
650.0000 mg | ORAL_TABLET | Freq: Four times a day (QID) | ORAL | Status: DC | PRN
  Administered 2022-07-05: 650 mg via ORAL
  Filled 2022-07-04: qty 2

## 2022-07-04 MED ORDER — CYCLOSPORINE MODIFIED (NEORAL) 25 MG PO CAPS: 75.0000 mg | ORAL_CAPSULE | ORAL | Status: DC

## 2022-07-04 MED ORDER — OXYBUTYNIN CHLORIDE 5 MG PO TABS: 5.0000 mg | ORAL_TABLET | Freq: Three times a day (TID) | ORAL | Status: DC | PRN

## 2022-07-04 MED ORDER — SEVELAMER CARBONATE 800 MG PO TABS
1600.0000 mg | ORAL_TABLET | Freq: Three times a day (TID) | ORAL | Status: DC
  Administered 2022-07-05 (×3): 1600 mg via ORAL
  Filled 2022-07-04 (×3): qty 2

## 2022-07-04 MED ORDER — ROSUVASTATIN CALCIUM 5 MG PO TABS
10.0000 mg | ORAL_TABLET | Freq: Every day | ORAL | Status: DC
  Administered 2022-07-05: 10 mg via ORAL
  Filled 2022-07-04: qty 2

## 2022-07-04 MED ORDER — FENTANYL CITRATE PF 50 MCG/ML IJ SOSY
50.0000 ug | PREFILLED_SYRINGE | Freq: Once | INTRAMUSCULAR | Status: AC
  Administered 2022-07-04: 50 ug via INTRAVENOUS
  Filled 2022-07-04: qty 1

## 2022-07-04 MED ORDER — CITALOPRAM HYDROBROMIDE 20 MG PO TABS
20.0000 mg | ORAL_TABLET | Freq: Every day | ORAL | Status: DC
  Administered 2022-07-05: 20 mg via ORAL
  Filled 2022-07-04: qty 2

## 2022-07-04 MED ORDER — MIDODRINE HCL 5 MG PO TABS
10.0000 mg | ORAL_TABLET | Freq: Three times a day (TID) | ORAL | Status: DC
  Administered 2022-07-04 – 2022-07-06 (×5): 10 mg via ORAL
  Filled 2022-07-04 (×5): qty 2

## 2022-07-04 NOTE — ED Notes (Signed)
Medication requested from pharmacy at this time 

## 2022-07-04 NOTE — Progress Notes (Signed)
Trauma Response Nurse Documentation  [name] is a 61 y.o. male arriving to [facility] ED via EMS  On Eliquis (apixaban) daily. Trauma was activated as a Level 2 based on the following trauma criteria Elderly patients > 65 with head trauma on anti-coagulation (excluding ASA). Trauma team at the bedside on patient arrival.   Patient cleared for CT by Dr. Gilford Raid. Pt transported to CT with trauma response nurse present to monitor. RN remained with the patient throughout their absence from the department for clinical observation.   GCS 14.  History   Past Medical History:  Diagnosis Date   Arthritis    Atrial fibrillation (HCC)    CHF (congestive heart failure), NYHA class III (HCC)    s/p heart transplant   Chronic kidney disease    end stage   Chronic systolic dysfunction of left ventricle    CVA (cerebral infarction)    Diabetes mellitus    PMH; Prior to heart transplant   Family history of coronary artery disease    in both parents   GERD (gastroesophageal reflux disease)    Hyperlipidemia    Hypertension    Left bundle branch block    Morbid obesity (HCC)    status post lap band   Myocardial infarction [facility])    prior to heart transplant   Nonischemic cardiomyopathy ([place])    prior to heart transplant   Obesity (BMI 30-39.9)    Obstructive sleep apnea    no longer needs CPAP after heart transplant per pt   Premature ventricular contractions    Prostate cancer (HCC)    SOB (shortness of breath)      Past Surgical History:  Procedure Laterality Date   CARDIAC PACEMAKER PLACEMENT  09/21/2009   Biventricular implantable cardioverter-defibrillator implantation      COLONOSCOPY W/ BIOPSIES AND POLYPECTOMY     CYSTOSCOPY WITH FULGERATION N/A 04/27/2021   Procedure: CYSTOSCOPY AND CLOT EVACUATION WITH BLADDER BIOPSY/ FUGARATION OF BLADDER/ FULGARATION OF PROSTATE ;  Surgeon: [name], MD;  Location: [facility] ORS;  Service: Urology;  Laterality: N/A;   FOOT SURGERY      left   HEART TRANSPLANT  2014   LAPAROSCOPIC GASTRIC BANDING  01/27/2007   LEFT VENTRICULAR ASSIST DEVICE     implanted at [place] Right 07/22/2017   Procedure: RIGHT TOTAL HIP ARTHROPLASTY ANTERIOR APPROACH;  Surgeon: Rod Can, MD;  Location: [facility];  Service: Orthopedics;  Laterality: Right;  Needs RNFA   TOTAL KNEE ARTHROPLASTY Right 07/22/2017     Initial Focused Assessment (If applicable, or please see trauma documentation): See charting  CT's Completed:   CT head, Cspine, C/A/P w/o contrast due to patient being on dialysis M/W/F  Interventions:  See charting   Event Summary: Patient with questionable fall per wife, has been altered and hypotensive since dialysis. Trauma workup did reveal rib fxs which patient claims "he already knew about". Pending full workup and recommendations at this time.  Bedside handoff with ED RN Amy.    [name]  Trauma Response RN  Please call TRN at [phone] for further assistance.

## 2022-07-04 NOTE — H&P (Signed)
History and Physical   [name] KDT:[id number] DOB: [birth year] DOA: 07/04/2022  PCP: Carol Ada, MD   Patient coming from: Home  Chief Complaint: AMS, Falls  HPI: [name] is a 61 y.o. male with medical history significant of CHF s/p Heart transplant, DM, HLD, OSA, HTN, A-Fib, Obesity, CVA, ESRD, Neuropathy, Prostate Cancer presenting with AMS and Falls.  History obtained assistance of chart review and family.  Patient reports he has been acting abnormally for the past few weeks.  Sleeping more somewhat confused may be more irritable and aggressive.  Patient did have a fall on October 29 and was seen in the ED here with negative CT head at that time and was discharged.   He does not remember having additional falls.  He had dialysis done today and afterwards he took some oxycodone but he thinks he may have taken too much and subsequently took Narcan which he is prescribed.  He denies fevers, chills, chest pain, shortness of breath, abdominal pain, constipation, diarrhea, nausea, vomiting.  Family does later report some concern for possible psychiatric etiology if other tests are negative.  ED Course: Vital signs in the ED significant for heart rate in the 100s to 120s, blood pressure in the 998P systolic.  Lab work-up included CMP with chloride 92, bicarb normal with a gap of 19, creatinine stable at 7.1, glucose 113, albumin 3.1, T. bili 1.9.  CBC with hemoglobin near baseline 9.7.  Lactic acid normal with repeat pending.  Blood cultures pending.  Chest x-ray showed multiple rib fractures which are new from recent work-up, CT head showed no acute abnormality, CT C-spine showed no acute abnormality.  CT chest abdomen pelvis showed multiple new rib fractures, chronic volume loss, evidence of atypical infection, cardiomegaly, evidence of pulmonary hypertension places indicating disease.  Review of Systems: As per HPI otherwise all other systems reviewed and are negative.  Past  Medical History:  Diagnosis Date   Arthritis    Atrial fibrillation (HCC)    CHF (congestive heart failure), NYHA class III (HCC)    s/p heart transplant   Chronic kidney disease    end stage   Chronic systolic dysfunction of left ventricle    CVA (cerebral infarction)    Diabetes mellitus    PMH; Prior to heart transplant   Family history of coronary artery disease    in both parents   GERD (gastroesophageal reflux disease)    Hyperlipidemia    Hypertension    Left bundle branch block    Morbid obesity (HCC)    status post lap band   Myocardial infarction [facility])    prior to heart transplant   Nonischemic cardiomyopathy ([place])    prior to heart transplant   Obesity (BMI 30-39.9)    Obstructive sleep apnea    no longer needs CPAP after heart transplant per pt   Premature ventricular contractions    Prostate cancer (HCC)    SOB (shortness of breath)     Past Surgical History:  Procedure Laterality Date   CARDIAC PACEMAKER PLACEMENT  09/21/2009   Biventricular implantable cardioverter-defibrillator implantation      COLONOSCOPY W/ BIOPSIES AND POLYPECTOMY     CYSTOSCOPY WITH FULGERATION N/A 04/27/2021   Procedure: CYSTOSCOPY AND CLOT EVACUATION WITH BLADDER BIOPSY/ FUGARATION OF BLADDER/ FULGARATION OF PROSTATE ;  Surgeon: [name], MD;  Location: [facility];  Service: Urology;  Laterality: N/A;   FOOT SURGERY     left   HEART TRANSPLANT  2014  LAPAROSCOPIC GASTRIC BANDING  01/27/2007   LEFT VENTRICULAR ASSIST DEVICE     implanted at [place] Right 07/22/2017   Procedure: RIGHT TOTAL HIP ARTHROPLASTY ANTERIOR APPROACH;  Surgeon: Rod Can, MD;  Location: [facility];  Service: Orthopedics;  Laterality: Right;  Needs RNFA   TOTAL KNEE ARTHROPLASTY Right 07/22/2017    Social History  reports that he has never smoked. He has quit using smokeless tobacco.  His smokeless tobacco use included snuff. He  reports that he does not drink alcohol and does not use drugs.  Allergies  Allergen Reactions   Heparin Nausea Only, Swelling and Other (See Comments)    * * HIT * *  SWELLING REACTION UNSPECIFIED   DIAPHORESIS    Iodinated Contrast Media    Penicillin G Potassium [Penicillin G] Nausea Only and Other (See Comments)    DIAPHORESIS    Family History  Problem Relation Age of Onset   Coronary artery disease Father        had, PTCA & CABG   Heart attack Father    Hypertension Father    Diabetes Father    Prostate cancer Father    Coronary artery disease Mother    Heart attack Mother    Hypertension Mother    Stroke Mother    Lupus Mother    Prostate cancer Brother    Coronary artery disease Brother    Coronary artery disease Sister    Heart attack Sister    Prostate cancer Paternal Uncle    Coronary artery disease Maternal Grandmother    Coronary artery disease Maternal Grandfather    Coronary artery disease Paternal Grandmother    Coronary artery disease Paternal Grandfather   Reviewed on admission  Prior to Admission medications   Medication Sig Start Date End Date Taking? Authorizing Provider  citalopram (CELEXA) 20 MG tablet Take 20 mg by mouth daily. 04/06/20  Yes [provider]  cycloSPORINE modified (NEORAL) 100 MG capsule Take 100 mg by mouth See admin instructions. Take with three 25 mg  (75 ) for a total of 175 mg twice a day   Yes [provider]  cycloSPORINE modified (NEORAL) 25 MG capsule Take 75 mg by mouth See admin instructions. Take With 100 mg tab for a total of 175 mg twice a day   Yes [provider]  ELIQUIS 2.5 MG TABS tablet Take 1 tablet (2.5 mg total) by mouth 2 (two) times daily. 04/30/21  Yes [name], MD  lidocaine (LIDODERM) 5 % Place 1 patch onto the skin daily. Remove & Discard patch within 12 hours or as directed by MD 06/24/22  Yes Godfrey Pick, MD  lidocaine (XYLOCAINE) 5 % ointment Apply 1 application  topically as needed. 04/14/19  Yes Jamse Arn, MD  methocarbamol (ROBAXIN) 500 MG tablet Take 1 tablet (500 mg total) by mouth every 8 (eight) hours as needed for muscle spasms. 06/24/22  Yes Godfrey Pick, MD  midodrine (PROAMATINE) 10 MG tablet Take 1 tablet (10 mg total) by mouth 3 (three) times daily. 04/27/21  Yes [name], MD  mycophenolate (MYFORTIC) 180 MG EC tablet Take 720 mg by mouth 2 (two) times daily.   Yes [provider]  ondansetron (ZOFRAN) 4 MG tablet Take 1 tablet (4 mg total) by mouth every 6 (six) hours as needed for nausea. 07/23/17  Yes Swinteck, Aaron Edelman, MD  OXcarbazepine (TRILEPTAL) 150 MG  tablet Take 1 tablet (150 mg total) by mouth 2 (two) times daily. 10/12/21  Yes Marcial Pacas, MD  oxybutynin (DITROPAN) 5 MG tablet Take 1 tablet (5 mg total) by mouth every 8 (eight) hours as needed for bladder spasms. 04/23/21  Yes Davonna Belling, MD  pregabalin (LYRICA) 300 MG capsule Take 300 mg by mouth 2 (two) times daily.   Yes [provider]  sevelamer carbonate (RENVELA) 800 MG tablet Take 2 tablets (1,600 mg total) by mouth 3 (three) times daily with meals. 11/11/18  Yes Kayleen Memos, DO  cinacalcet (SENSIPAR) 60 MG tablet Take 60 mg by mouth daily. Patient not taking: Reported on 07/04/2022 04/19/21   [provider]  lidocaine-prilocaine (EMLA) cream Apply 1 application topically as needed. Patient not taking: Reported on 07/04/2022    [provider]  pregabalin (LYRICA) 25 MG capsule TAKE 5 CAPSULES BY MOUTH AFTER DIALYSIS Patient not taking: Reported on 07/04/2022 06/09/20   Jamse Arn, MD  pregabalin (LYRICA) 75 MG capsule TAKE 1 CAPSULE(75 MG) BY MOUTH DAILY Patient not taking: Reported on 07/04/2022 07/12/20   Jamse Arn, MD    Physical Exam: Vitals:   07/04/22 1620 07/04/22 1710 07/04/22 1735 07/04/22 1800  BP:  102/68 91/63 101/67  Pulse:  (!) 104 (!) 102 (!) 102  Resp:  '16 15 13  '$ Temp:      TempSrc:       SpO2:  95% 95% 97%  Weight: (!) 142.9 kg     Height: '6\' 6"'$  (1.981 m)       Physical Exam Constitutional:      General: He is not in acute distress.    Appearance: Normal appearance.  HENT:     Head: Normocephalic and atraumatic.     Mouth/Throat:     Mouth: Mucous membranes are moist.     Pharynx: Oropharynx is clear.  Eyes:     Extraocular Movements: Extraocular movements intact.     Pupils: Pupils are equal, round, and reactive to light.  Cardiovascular:     Rate and Rhythm: Normal rate and regular rhythm.     Pulses: Normal pulses.     Heart sounds: Normal heart sounds.  Pulmonary:     Effort: Pulmonary effort is normal. No respiratory distress.     Breath sounds: Normal breath sounds.  Abdominal:     General: Bowel sounds are normal. There is no distension.     Palpations: Abdomen is soft.     Tenderness: There is no abdominal tenderness.  Musculoskeletal:        General: Tenderness (Bilateral ribs) present. No swelling or deformity.  Skin:    General: Skin is warm and dry.  Neurological:     General: No focal deficit present.     Mental Status: Mental status is at baseline.    Labs on Admission: I have personally reviewed following labs and imaging studies  CBC: Recent Labs  Lab 07/04/22 1529  WBC 9.6  NEUTROABS 7.5  HGB 9.7*  HCT 30.5*  MCV 92.1  PLT 024    Basic Metabolic Panel: Recent Labs  Lab 07/04/22 1529  NA 140  K 3.8  CL 92*  CO2 29  GLUCOSE 113*  BUN 23  CREATININE 7.10*  CALCIUM 9.2    GFR: Estimated Creatinine Clearance: 17.3 mL/min (A) (by C-G formula based on SCr of 7.1 mg/dL (H)).  Liver Function Tests: Recent Labs  Lab 07/04/22 1529  AST 18  ALT 21  ALKPHOS  87  BILITOT 1.9*  PROT 7.7  ALBUMIN 3.1*    Urine analysis:    Component Value Date/Time   COLORURINE YELLOW 09/07/2008 1329   APPEARANCEUR CLEAR 09/07/2008 1329   LABSPEC 1.011 09/07/2008 1329   PHURINE 7.0 09/07/2008 1329   GLUCOSEU NEGATIVE 09/07/2008  1329   HGBUR NEGATIVE 09/07/2008 1329   BILIRUBINUR NEGATIVE 09/07/2008 1329   KETONESUR NEGATIVE 09/07/2008 1329   PROTEINUR NEGATIVE 09/07/2008 1329   UROBILINOGEN 1.0 09/07/2008 1329   NITRITE NEGATIVE 09/07/2008 1329   LEUKOCYTESUR  09/07/2008 1329    NEGATIVE MICROSCOPIC NOT DONE ON URINES WITH NEGATIVE PROTEIN, BLOOD, LEUKOCYTES, NITRITE, OR GLUCOSE <1000 mg/dL.    Radiological Exams on Admission: CT Head Wo Contrast  Result Date: 07/04/2022 CLINICAL DATA:  Interval trauma with new right rib fractures. Possible falls. Altered mental status. EXAM: CT HEAD WITHOUT CONTRAST CT CERVICAL SPINE WITHOUT CONTRAST TECHNIQUE: Multidetector CT imaging of the head and cervical spine was performed following the standard protocol without intravenous contrast. Multiplanar CT image reconstructions of the cervical spine were also generated. RADIATION DOSE REDUCTION: This exam was performed according to the departmental dose-optimization program which includes automated exposure control, adjustment of the mA and/or kV according to patient size and/or use of iterative reconstruction technique. COMPARISON:  06/24/2022 FINDINGS: CT HEAD FINDINGS Brain: Stable appearance of encephalomalacia from remote right frontal lobe infarct, no change or progression. Periventricular white matter and corona radiata hypodensities favor chronic ischemic microvascular white matter disease. Otherwise, the brainstem, cerebellum, cerebral peduncles, thalamus, basal ganglia, basilar cisterns, and ventricular system appear within normal limits. No intracranial hemorrhage, mass lesion, or acute CVA. Vascular: There is atherosclerotic calcification of the cavernous carotid arteries bilaterally. Skull: Unremarkable Sinuses/Orbits: Unremarkable Other: No supplemental non-categorized findings. CT CERVICAL SPINE FINDINGS Alignment: 3 mm chronic degenerative anterolisthesis at C2-3. Loss of the normal cervical lordosis, which can be associated  with muscle spasm. Skull base and vertebrae: Fused left facet joint at C3-4 along with some probable mild interbody fusion this level. Reduced signal to noise ratio in the lower cervical spine due to body habitus. Stable spurring and possible small erosions versus degenerative subcortical cysts at the anterior C1-2 articulation. Paragraphs there is loss of intervertebral disc height along with mild endplate irregularity at all levels between C2 and C7, as before, compatible with degenerative disc disease and degenerative endplate findings. No cervical spine fracture or acute bony findings identified. Soft tissues and spinal canal: Unremarkable Disc levels: Bilateral osseous foraminal stenosis at C3-4 due to uncinate spurring. There is likely mild left foraminal stenosis at C6-7 due to uncinate spurring. Upper chest: Lung apices appear clear. Other: Lucent lesion in the left first rib with trabecular coarsening as shown on image 64 series 4. This had mixed but mostly high T2 signal on the MRI from 11/27/2021 and could well be a hemangioma but is not entirely technically specific. This lesion is fairly indistinct on radiography condom, manifesting only is faint speckled density on chest radiographs, and is difficult to corroborate on older chest radiograph studies. A lytic lesion of bone such as a metastatic lesion is considered less likely but not entirely excluded given the limited characterization of this lesion. There is no adjacent soft tissue mass. IMPRESSION: 1. No acute intracranial findings or acute cervical spine findings. 2. Stable encephalomalacia from remote right frontal lobe infarct. 3. Periventricular white matter and corona radiata hypodensities favor chronic ischemic microvascular white matter disease. 4. Lucent lesion in the left first rib with trabecular coarsening. This has  mixed but mostly high T2 signal on the MRI from 11/27/2021 and could well be a hemangioma but is not entirely technically  specific. No adjacent soft tissue mass. This might be further characterized with whole-body bone scan or MRI of the left upper chest with attention to the left first rib. 5. Atherosclerosis. 6. Cervical spondylosis and degenerative disc disease causing bilateral osseous foraminal stenosis at C3-4 and mild left foraminal stenosis at C6-7. Electronically Signed   By: Van Clines M.D.   On: 07/04/2022 18:05   CT Cervical Spine Wo Contrast  Result Date: 07/04/2022 CLINICAL DATA:  Interval trauma with new right rib fractures. Possible falls. Altered mental status. EXAM: CT HEAD WITHOUT CONTRAST CT CERVICAL SPINE WITHOUT CONTRAST TECHNIQUE: Multidetector CT imaging of the head and cervical spine was performed following the standard protocol without intravenous contrast. Multiplanar CT image reconstructions of the cervical spine were also generated. RADIATION DOSE REDUCTION: This exam was performed according to the departmental dose-optimization program which includes automated exposure control, adjustment of the mA and/or kV according to patient size and/or use of iterative reconstruction technique. COMPARISON:  06/24/2022 FINDINGS: CT HEAD FINDINGS Brain: Stable appearance of encephalomalacia from remote right frontal lobe infarct, no change or progression. Periventricular white matter and corona radiata hypodensities favor chronic ischemic microvascular white matter disease. Otherwise, the brainstem, cerebellum, cerebral peduncles, thalamus, basal ganglia, basilar cisterns, and ventricular system appear within normal limits. No intracranial hemorrhage, mass lesion, or acute CVA. Vascular: There is atherosclerotic calcification of the cavernous carotid arteries bilaterally. Skull: Unremarkable Sinuses/Orbits: Unremarkable Other: No supplemental non-categorized findings. CT CERVICAL SPINE FINDINGS Alignment: 3 mm chronic degenerative anterolisthesis at C2-3. Loss of the normal cervical lordosis, which can be  associated with muscle spasm. Skull base and vertebrae: Fused left facet joint at C3-4 along with some probable mild interbody fusion this level. Reduced signal to noise ratio in the lower cervical spine due to body habitus. Stable spurring and possible small erosions versus degenerative subcortical cysts at the anterior C1-2 articulation. Paragraphs there is loss of intervertebral disc height along with mild endplate irregularity at all levels between C2 and C7, as before, compatible with degenerative disc disease and degenerative endplate findings. No cervical spine fracture or acute bony findings identified. Soft tissues and spinal canal: Unremarkable Disc levels: Bilateral osseous foraminal stenosis at C3-4 due to uncinate spurring. There is likely mild left foraminal stenosis at C6-7 due to uncinate spurring. Upper chest: Lung apices appear clear. Other: Lucent lesion in the left first rib with trabecular coarsening as shown on image 64 series 4. This had mixed but mostly high T2 signal on the MRI from 11/27/2021 and could well be a hemangioma but is not entirely technically specific. This lesion is fairly indistinct on radiography condom, manifesting only is faint speckled density on chest radiographs, and is difficult to corroborate on older chest radiograph studies. A lytic lesion of bone such as a metastatic lesion is considered less likely but not entirely excluded given the limited characterization of this lesion. There is no adjacent soft tissue mass. IMPRESSION: 1. No acute intracranial findings or acute cervical spine findings. 2. Stable encephalomalacia from remote right frontal lobe infarct. 3. Periventricular white matter and corona radiata hypodensities favor chronic ischemic microvascular white matter disease. 4. Lucent lesion in the left first rib with trabecular coarsening. This has mixed but mostly high T2 signal on the MRI from 11/27/2021 and could well be a hemangioma but is not entirely  technically specific. No adjacent soft tissue  mass. This might be further characterized with whole-body bone scan or MRI of the left upper chest with attention to the left first rib. 5. Atherosclerosis. 6. Cervical spondylosis and degenerative disc disease causing bilateral osseous foraminal stenosis at C3-4 and mild left foraminal stenosis at C6-7. Electronically Signed   By: Van Clines M.D.   On: 07/04/2022 18:05   CT CHEST ABDOMEN PELVIS WO CONTRAST  Result Date: 07/04/2022 CLINICAL DATA:  Altered behavior, possible medication overdose. Anti coagulation. Fall on 06/24/2022. Dialysis this morning. New rib fractures on chest radiography today. EXAM: CT CHEST, ABDOMEN AND PELVIS WITHOUT CONTRAST TECHNIQUE: Multidetector CT imaging of the chest, abdomen and pelvis was performed following the standard protocol without IV contrast. RADIATION DOSE REDUCTION: This exam was performed according to the departmental dose-optimization program which includes automated exposure control, adjustment of the mA and/or kV according to patient size and/or use of iterative reconstruction technique. COMPARISON:  Multiple exams, including 06/24/2022 and 04/23/2021 FINDINGS: CT CHEST FINDINGS Cardiovascular: Atherosclerotic calcification of the thoracic aorta and coronary arteries along with cardiomegaly. Substantially enlarged but stable main pulmonary artery and right atrium. There are some pericardial and epicardial calcifications along with some venous calcifications probably related to prior indwelling catheters. Mediastinum/Nodes: No pathologic adenopathy. Lungs/Pleura: Mildly improved atelectasis in the right lower lobe compared to the 06/24/2022 exam. Peribronchovascular reticulonodular and tree-in-bud opacities in the left upper lobe for example on image 54 series 4, likely from atypical infectious process. Continued substantial volume loss and some consolidation in the left lower lobe, roughly similar to prior,  encompassing most of the left lower lobe. Musculoskeletal: There are a variety of newly visible right rib fractures compared to the prior exam. These include fractures of the right third, fourth, fifth, sixth, and seventh ribs. The fourth, fifth, sixth, and seventh rib fractures are segmental, with the fourth, fifth, and sixth rib fractures being multi segmental (fractures in 3 locations in each rib). However, for the most part the rib fractures are nondisplaced or only minimally displaced. No thoracic spine fracture is identified. CT ABDOMEN PELVIS FINDINGS Hepatobiliary: Unremarkable.  Contracted gallbladder. Pancreas: Unremarkable Spleen: Unremarkable Adrenals/Urinary Tract: Innumerable cysts of varying complexity throughout both kidneys compatible with autosomal dominant polycystic kidney disease, similar distribution to prior. Adrenal glands unremarkable. Urinary bladder empty. Small punctate calcification along the renal collecting systems likely mostly vascular although a component of nonobstructive renal calculi are difficult to exclude particularly on the left side. Stomach/Bowel: Prior sleeve gastrectomy. Prominent stool throughout the colon favors constipation. No dilated small bowel. Normal appendix. Vascular/Lymphatic: Atherosclerosis is present, including aortoiliac atherosclerotic disease. No pathologic adenopathy. Reproductive: Fiducials along the prostate gland. Other: No supplemental non-categorized findings. Musculoskeletal: Right total hip prosthesis. Lower lumbar degenerative facet arthropathy probably with facet joint effusions at L4-5 bilaterally. Umbilical hernia contains adipose tissue. Moderate degenerative arthropathy of the left hip. IMPRESSION: 1. There are a variety of newly visible right rib fractures compared to the prior exam. These include fractures of the right third, fourth, fifth, sixth, and seventh ribs. The fourth, fifth, and sixth rib fractures are segmental, with the fourth,  fifth, and sixth rib fractures being multi segmental. However, for the most part the rib fractures are nondisplaced or only minimally displaced. No pneumothorax or substantial pleural effusion. 2. Continued substantial volume loss and some consolidation in the left lower lobe, roughly similar to prior. Much of this is chronic back through 2016. 3. Peribronchovascular reticulonodular and tree-in-bud opacities in the left upper lobe, likely from atypical infectious process. 4.  Cardiomegaly with considerable enlargement of the main pulmonary artery and right atrium, suggesting pulmonary arterial hypertension. 5. Prominent stool throughout the colon favors constipation. 6. Autosomal dominant polycystic kidney disease. Cannot exclude punctate nonobstructive left nephrolithiasis. 7. Umbilical hernia contains adipose tissue. 8. Moderate degenerative arthropathy of the left hip. 9. Lower lumbar degenerative facet arthropathy probably with facet joint effusions at L4-5 bilaterally. 10. Aortic atherosclerosis. Aortic Atherosclerosis (ICD10-I70.0). Electronically Signed   By: Van Clines M.D.   On: 07/04/2022 17:46   DG Chest Portable 1 View  Result Date: 07/04/2022 CLINICAL DATA:  Altered mental status.  Right-sided chest pain. EXAM: PORTABLE CHEST 1 VIEW COMPARISON:  11/09/2018 radiography.  Chest CT 06/24/2022 FINDINGS: Previous median sternotomy. Cardiomegaly. Tortuous aorta. The patient has taken a poor inspiration. Allowing for that, there may be mild volume loss at both lung bases. Upper lobes appear clear. No visible effusion. There are multiple rib fractures on the right which are new since the chest CT of 10 days ago, including ribs 2 through 7. No pneumothorax. IMPRESSION: 1. Multiple rib fractures on the right which are new since the chest CT of 10 days ago. No pneumothorax. 2. Poor inspiration. Allowing for that, there may be mild volume loss at both lung bases. Electronically Signed   By: Nelson Chimes  M.D.   On: 07/04/2022 15:49    EKG: Independently reviewed.  Sinus versus ectopic atrial tachycardia 119 bpm. PVC, right bundle branch block, some abnormalities likely due to history of heart transplant.  Assessment/Plan Active Problems:   Diabetes mellitus type 2 in obese (HCC)   Dyslipidemia   OBSTRUCTIVE SLEEP APNEA   Essential hypertension   Atrial fibrillation (HCC)   History of CVA (cerebrovascular accident)   Obesity, Class III, BMI 40-49.9 (morbid obesity) ([place])   End stage renal disease on dialysis [facility])   Malignant neoplasm of prostate ([place])   H/O heart transplant ([place])   Peripheral neuropathy   Altered mental status > Patient presenting with reported lethargy and altered mentation, has had falls recently and does not remember his most recent one it appears. > He is alert and oriented on exam.  Right now possible etiologies include polypharmacy, atypical infection based on CT chest findings, versus less likely nonorganic cause (family did raise some concern for possible psychiatric issue especially further results are normal) or other medication toxicity. > We will check full respiratory viral panel to rule out atypical infection in addition to fluid COVID screening. - Monitor on telemetry - Hold home Robaxin, Lyrica, oxcarbazepine - Hold home trazodone and Celexa - Full RVP, flu, COVID screen - Continue to monitor  Multiple rib fractures Falls > Patient with new multiple falls.  Did have a recent fall that he was seen for with negative CT head and no rib fractures noted at that time.  Now with multiple rib fractures from a fall that he does not appear to remember. > Patient and family reporting some swelling and pain in feet that were recently casted and put in a boot > Trauma surgery consulted in the ED and will see the patient. - Appreciate trauma surgery recommendations - As needed pain control - Hold Eliquis for now pending trauma surgery recommendations - Rest per  trauma surgery - X-ray bilateral feet.  History of CHF Status post heart transplant > Follows with [facility] advanced heart failure and transplant teams - Continue home cyclosporine and mycophenolate  ESRD on HD MWF - Last dialysis today - We will need nephrology consult if  he will be here through Friday - Continue home Renvela  Diabetes - SSI  Hyperlipidemia - Continue home rosuvastatin  Atrial fibrillation - Continue home Eliquis  Hypertension - Continue home midodrine  Neuropathy - Holding home Lyrica as above  History of stroke - Continue home rosuvastatin  Obesity Prostate cancer - Noted  DVT prophylaxis: Eliquis Code Status:   Full Family Communication:  Updated at bedside Disposition Plan:   Patient is from:  Home  Anticipated DC to:  Home  Anticipated DC date:  1 to 2 days  Anticipated DC barriers: None  Consults called:  Trauma surgery Admission status:  Observation, telemetry  Severity of Illness: The appropriate patient status for this patient is OBSERVATION. Observation status is judged to be reasonable and necessary in order to provide the required intensity of service to ensure the patient's safety. The patient's presenting symptoms, physical exam findings, and initial radiographic and laboratory data in the context of their medical condition is felt to place them at decreased risk for further clinical deterioration. Furthermore, it is anticipated that the patient will be medically stable for discharge from the hospital within 2 midnights of admission.    Marcelyn Bruins MD [facility]  How to contact the [facility] Attending or Consulting provider [place] or covering provider during after hours [place], for this patient?   Check the care team in [facility] and look for a) attending/consulting [facility] provider listed and b) the [facility] team listed Log into [email or web address] and use [facility] universal password to access. If you do not have the password, please  contact the hospital operator. Locate the [facility] provider you are looking for under [facility] and page to a number that you can be directly reached. If you still have difficulty reaching the provider, please page the [facility] (Director on Call) for the Hospitalists listed on amion for assistance.  07/04/2022, 7:08 PM

## 2022-07-04 NOTE — Consult Note (Signed)
RAYMONDO GARCIALOPEZ 10-Jan-1961  [id number].    Requesting MD: Dr. Tyrone Nine Chief Complaint/Reason for Consult: Rib fractures  HPI:  Mr. Kenneth is a 61 yo male with a history of heart transplant, GERD, HTN, DM, OSA, a-fib (on Eliquis) and ESRD on HD who presented to the ED today with altered mental status. He has reportedly had several falls in the last few weeks, and was seen in the ED on 10/29 after a fall at which time a head CT was negative. Chest CT at that time showed a single acute fracture of the right fifth rib. He was discharged home. His family reports that sometime in the last 3 days he had another fall, but they are not sure exactly when this occurred. He has neuropathy and they report that his leg frequently buckles. He got dialysis this morning and has been feeling weak after HD sessions.  CT scans of the head, neck, and chest/abd/pelvis showed numerous new right sided rib fractures, but no pneumothorax and no other injuries. Trauma was consulted.  ROS: Review of Systems  Constitutional:  Negative for chills and fever.  Respiratory:  Negative for shortness of breath.   Cardiovascular:  Negative for chest pain.  Gastrointestinal:  Negative for abdominal pain.  Musculoskeletal:  Positive for falls.  Neurological:  Positive for weakness.    Family History  Problem Relation Age of Onset   Coronary artery disease Father        had, PTCA & CABG   Heart attack Father    Hypertension Father    Diabetes Father    Prostate cancer Father    Coronary artery disease Mother    Heart attack Mother    Hypertension Mother    Stroke Mother    Lupus Mother    Prostate cancer Brother    Coronary artery disease Brother    Coronary artery disease Sister    Heart attack Sister    Prostate cancer Paternal Uncle    Coronary artery disease Maternal Grandmother    Coronary artery disease Maternal Grandfather    Coronary artery disease Paternal Grandmother    Coronary artery disease Paternal  Grandfather     Past Medical History:  Diagnosis Date   Arthritis    Atrial fibrillation (HCC)    CHF (congestive heart failure), NYHA class III ([place])    s/p heart transplant   Chronic kidney disease    end stage   Chronic systolic dysfunction of left ventricle    CVA (cerebral infarction)    Diabetes mellitus    PMH; Prior to heart transplant   Family history of coronary artery disease    in both parents   GERD (gastroesophageal reflux disease)    Hyperlipidemia    Hypertension    Left bundle branch block    Morbid obesity ([place])    status post lap band   Myocardial infarction [facility])    prior to heart transplant   Nonischemic cardiomyopathy ([place])    prior to heart transplant   Obesity (BMI 30-39.9)    Obstructive sleep apnea    no longer needs CPAP after heart transplant per pt   Premature ventricular contractions    Prostate cancer (HCC)    SOB (shortness of breath)     Past Surgical History:  Procedure Laterality Date   CARDIAC PACEMAKER PLACEMENT  09/21/2009   Biventricular implantable cardioverter-defibrillator implantation      COLONOSCOPY W/ BIOPSIES AND POLYPECTOMY     CYSTOSCOPY WITH FULGERATION N/A 04/27/2021  Procedure: CYSTOSCOPY AND CLOT EVACUATION WITH BLADDER BIOPSY/ FUGARATION OF BLADDER/ FULGARATION OF PROSTATE ;  Surgeon: [name], MD;  Location: [facility];  Service: Urology;  Laterality: N/A;   FOOT SURGERY     left   HEART TRANSPLANT  2014   LAPAROSCOPIC GASTRIC BANDING  01/27/2007   LEFT VENTRICULAR ASSIST DEVICE     implanted at [place] Right 07/22/2017   Procedure: RIGHT TOTAL HIP ARTHROPLASTY ANTERIOR APPROACH;  Surgeon: Rod Can, MD;  Location: [facility];  Service: Orthopedics;  Laterality: Right;  Needs RNFA   TOTAL KNEE ARTHROPLASTY Right 07/22/2017    Social History:  reports that he has never smoked. He has quit using smokeless tobacco.  His smokeless tobacco use  included snuff. He reports that he does not drink alcohol and does not use drugs.  Allergies:  Allergies  Allergen Reactions   Heparin Nausea Only, Swelling and Other (See Comments)    * * HIT * *  SWELLING REACTION UNSPECIFIED   DIAPHORESIS    Iodinated Contrast Media    Penicillin G Potassium [Penicillin G] Nausea Only and Other (See Comments)    DIAPHORESIS    (Not in a hospital admission)    Physical Exam: Blood pressure 101/67, pulse (!) 102, temperature 99.2 F (37.3 C), temperature source Oral, resp. rate 13, height '6\' 6"'$  (1.981 m), weight (!) 142.9 kg, SpO2 97 %. General: resting comfortably, appears stated age, no apparent distress Neurological: alert, no focal deficits HEENT: normocephalic, atraumatic, oropharynx clear, no scleral icterus CV: regular rate and rhythm, extremities warm and well-perfused Respiratory: normal work of breathing on room air, no chest wall deformities or crepitus Abdomen: soft, nondistended, nontender to deep palpation. No masses or organomegaly. Extremities: warm and well-perfused, no deformities. AVF in place RUE.  Psychiatric: normal mood and affect Skin: warm and dry, no jaundice, no rashes or lesions   Results for orders placed or performed during the hospital encounter of 07/04/22 (from the past 48 hour(s))  CBG monitoring, ED     Status: Abnormal   Collection Time: 07/04/22  3:23 PM  Result Value Ref Range   Glucose-Capillary 114 (H) 70 - 99 mg/dL    Comment: Glucose reference range applies only to samples taken after fasting for at least 8 hours.  CBC with Differential     Status: Abnormal   Collection Time: 07/04/22  3:29 PM  Result Value Ref Range   WBC 9.6 4.0 - 10.5 K/uL   RBC 3.31 (L) 4.22 - 5.81 MIL/uL   Hemoglobin 9.7 (L) 13.0 - 17.0 g/dL   HCT 30.5 (L) 39.0 - 52.0 %   MCV 92.1 80.0 - 100.0 fL   MCH 29.3 26.0 - 34.0 pg   MCHC 31.8 30.0 - 36.0 g/dL   RDW 15.6 (H) 11.5 - 15.5 %   Platelets 196 150 - 400 K/uL   nRBC  0.0 0.0 - 0.2 %   Neutrophils Relative % 78 %   Neutro Abs 7.5 1.7 - 7.7 K/uL   Lymphocytes Relative 7 %   Lymphs Abs 0.7 0.7 - 4.0 K/uL   Monocytes Relative 11 %   Monocytes Absolute 1.0 0.1 - 1.0 K/uL   Eosinophils Relative 3 %   Eosinophils Absolute 0.3 0.0 - 0.5 K/uL   Basophils Relative 1 %   Basophils Absolute 0.1 0.0 - 0.1 K/uL   Immature Granulocytes 0 %   Abs  Immature Granulocytes 0.04 0.00 - 0.07 K/uL    Comment: Performed at [facility], [place] [address], [place], [place] [address]  Comprehensive metabolic panel     Status: Abnormal   Collection Time: 07/04/22  3:29 PM  Result Value Ref Range   Sodium 140 135 - 145 mmol/L   Potassium 3.8 3.5 - 5.1 mmol/L   Chloride 92 (L) 98 - 111 mmol/L   CO2 29 22 - 32 mmol/L   Glucose, Bld 113 (H) 70 - 99 mg/dL    Comment: Glucose reference range applies only to samples taken after fasting for at least 8 hours.   BUN 23 8 - 23 mg/dL   Creatinine, Ser 7.10 (H) 0.61 - 1.24 mg/dL   Calcium 9.2 8.9 - 10.3 mg/dL   Total Protein 7.7 6.5 - 8.1 g/dL   Albumin 3.1 (L) 3.5 - 5.0 g/dL   AST 18 15 - 41 U/L   ALT 21 0 - 44 U/L   Alkaline Phosphatase 87 38 - 126 U/L   Total Bilirubin 1.9 (H) 0.3 - 1.2 mg/dL   GFR, Estimated 8 (L) >60 mL/min    Comment: (NOTE) Calculated using the CKD-EPI Creatinine Equation (2021)    Anion gap 19 (H) 5 - 15    Comment: Performed at [facility], [place] [address], [place], [place] [address]  Lactic acid, plasma     Status: None   Collection Time: 07/04/22  3:29 PM  Result Value Ref Range   Lactic Acid, Venous 1.6 0.5 - 1.9 mmol/L    Comment: Performed at [facility] [address], [place], [place] [address]   DG Foot 2 Views Left  Result Date: 07/04/2022 CLINICAL DATA:  Fall EXAM: LEFT FOOT - 2 VIEW COMPARISON:  12/23/2017 FINDINGS: Vascular calcifications. Removal of previously noted surgical pin from the second digit. Fixating screw across the fourth PIP joint with osseous fusion.  Defect base of the second distal phalanx with smooth sclerotic margin suggesting chronic deformity. There appears to be resection of the second middle phalanx and the distal aspect of the second proximal phalanx. IMPRESSION: 1. No definitive acute osseous abnormality. 2. Postsurgical changes of the second and fourth digits. Chronic appearing deformity at the second distal removal of previously noted fixating pin in the second digit with apparent interval resection of the distal aspect second proximal phalanx as well as the middle phalanx of second digit. Chronic appearing defect base of the second distal phalanx. Electronically Signed   By: Donavan Foil M.D.   On: 07/04/2022 20:31   DG Foot 2 Views Right  Result Date: 07/04/2022 CLINICAL DATA:  Fall EXAM: RIGHT FOOT - 2 VIEW COMPARISON:  None Available. FINDINGS: Bones appear demineralized. Mild degenerative change at the first MTP joint. Malalignment at the second TMT joint with lateral subluxation of the base of the metatarsal. Heterogeneous sclerosis at the base of the second and probably third metatarsals but slightly limited assessment due to positioning and osseous superimposition. Vascular calcifications. Possible old deformity at the distal fibula on lateral view. Dorsal degenerative change at the TMT joints. IMPRESSION: 1. Appearance of malalignment at the second and probably third TMT joints as may be seen with Lisfranc injury. Apparent heterogeneous sclerosis at the base of the second and probably third metatarsal bases suggesting chronic fracture Electronically Signed   By: Donavan Foil M.D.   On: 07/04/2022 20:28   CT Head Wo Contrast  Result Date: 07/04/2022 CLINICAL DATA:  Interval  trauma with new right rib fractures. Possible falls. Altered mental status. EXAM: CT HEAD WITHOUT CONTRAST CT CERVICAL SPINE WITHOUT CONTRAST TECHNIQUE: Multidetector CT imaging of the head and cervical spine was performed following the standard protocol without  intravenous contrast. Multiplanar CT image reconstructions of the cervical spine were also generated. RADIATION DOSE REDUCTION: This exam was performed according to the departmental dose-optimization program which includes automated exposure control, adjustment of the mA and/or kV according to patient size and/or use of iterative reconstruction technique. COMPARISON:  06/24/2022 FINDINGS: CT HEAD FINDINGS Brain: Stable appearance of encephalomalacia from remote right frontal lobe infarct, no change or progression. Periventricular white matter and corona radiata hypodensities favor chronic ischemic microvascular white matter disease. Otherwise, the brainstem, cerebellum, cerebral peduncles, thalamus, basal ganglia, basilar cisterns, and ventricular system appear within normal limits. No intracranial hemorrhage, mass lesion, or acute CVA. Vascular: There is atherosclerotic calcification of the cavernous carotid arteries bilaterally. Skull: Unremarkable Sinuses/Orbits: Unremarkable Other: No supplemental non-categorized findings. CT CERVICAL SPINE FINDINGS Alignment: 3 mm chronic degenerative anterolisthesis at C2-3. Loss of the normal cervical lordosis, which can be associated with muscle spasm. Skull base and vertebrae: Fused left facet joint at C3-4 along with some probable mild interbody fusion this level. Reduced signal to noise ratio in the lower cervical spine due to body habitus. Stable spurring and possible small erosions versus degenerative subcortical cysts at the anterior C1-2 articulation. Paragraphs there is loss of intervertebral disc height along with mild endplate irregularity at all levels between C2 and C7, as before, compatible with degenerative disc disease and degenerative endplate findings. No cervical spine fracture or acute bony findings identified. Soft tissues and spinal canal: Unremarkable Disc levels: Bilateral osseous foraminal stenosis at C3-4 due to uncinate spurring. There is likely  mild left foraminal stenosis at C6-7 due to uncinate spurring. Upper chest: Lung apices appear clear. Other: Lucent lesion in the left first rib with trabecular coarsening as shown on image 64 series 4. This had mixed but mostly high T2 signal on the MRI from 11/27/2021 and could well be a hemangioma but is not entirely technically specific. This lesion is fairly indistinct on radiography condom, manifesting only is faint speckled density on chest radiographs, and is difficult to corroborate on older chest radiograph studies. A lytic lesion of bone such as a metastatic lesion is considered less likely but not entirely excluded given the limited characterization of this lesion. There is no adjacent soft tissue mass. IMPRESSION: 1. No acute intracranial findings or acute cervical spine findings. 2. Stable encephalomalacia from remote right frontal lobe infarct. 3. Periventricular white matter and corona radiata hypodensities favor chronic ischemic microvascular white matter disease. 4. Lucent lesion in the left first rib with trabecular coarsening. This has mixed but mostly high T2 signal on the MRI from 11/27/2021 and could well be a hemangioma but is not entirely technically specific. No adjacent soft tissue mass. This might be further characterized with whole-body bone scan or MRI of the left upper chest with attention to the left first rib. 5. Atherosclerosis. 6. Cervical spondylosis and degenerative disc disease causing bilateral osseous foraminal stenosis at C3-4 and mild left foraminal stenosis at C6-7. Electronically Signed   By: Van Clines M.D.   On: 07/04/2022 18:05   CT Cervical Spine Wo Contrast  Result Date: 07/04/2022 CLINICAL DATA:  Interval trauma with new right rib fractures. Possible falls. Altered mental status. EXAM: CT HEAD WITHOUT CONTRAST CT CERVICAL SPINE WITHOUT CONTRAST TECHNIQUE: Multidetector CT imaging of the head  and cervical spine was performed following the standard protocol  without intravenous contrast. Multiplanar CT image reconstructions of the cervical spine were also generated. RADIATION DOSE REDUCTION: This exam was performed according to the departmental dose-optimization program which includes automated exposure control, adjustment of the mA and/or kV according to patient size and/or use of iterative reconstruction technique. COMPARISON:  06/24/2022 FINDINGS: CT HEAD FINDINGS Brain: Stable appearance of encephalomalacia from remote right frontal lobe infarct, no change or progression. Periventricular white matter and corona radiata hypodensities favor chronic ischemic microvascular white matter disease. Otherwise, the brainstem, cerebellum, cerebral peduncles, thalamus, basal ganglia, basilar cisterns, and ventricular system appear within normal limits. No intracranial hemorrhage, mass lesion, or acute CVA. Vascular: There is atherosclerotic calcification of the cavernous carotid arteries bilaterally. Skull: Unremarkable Sinuses/Orbits: Unremarkable Other: No supplemental non-categorized findings. CT CERVICAL SPINE FINDINGS Alignment: 3 mm chronic degenerative anterolisthesis at C2-3. Loss of the normal cervical lordosis, which can be associated with muscle spasm. Skull base and vertebrae: Fused left facet joint at C3-4 along with some probable mild interbody fusion this level. Reduced signal to noise ratio in the lower cervical spine due to body habitus. Stable spurring and possible small erosions versus degenerative subcortical cysts at the anterior C1-2 articulation. Paragraphs there is loss of intervertebral disc height along with mild endplate irregularity at all levels between C2 and C7, as before, compatible with degenerative disc disease and degenerative endplate findings. No cervical spine fracture or acute bony findings identified. Soft tissues and spinal canal: Unremarkable Disc levels: Bilateral osseous foraminal stenosis at C3-4 due to uncinate spurring. There is  likely mild left foraminal stenosis at C6-7 due to uncinate spurring. Upper chest: Lung apices appear clear. Other: Lucent lesion in the left first rib with trabecular coarsening as shown on image 64 series 4. This had mixed but mostly high T2 signal on the MRI from 11/27/2021 and could well be a hemangioma but is not entirely technically specific. This lesion is fairly indistinct on radiography condom, manifesting only is faint speckled density on chest radiographs, and is difficult to corroborate on older chest radiograph studies. A lytic lesion of bone such as a metastatic lesion is considered less likely but not entirely excluded given the limited characterization of this lesion. There is no adjacent soft tissue mass. IMPRESSION: 1. No acute intracranial findings or acute cervical spine findings. 2. Stable encephalomalacia from remote right frontal lobe infarct. 3. Periventricular white matter and corona radiata hypodensities favor chronic ischemic microvascular white matter disease. 4. Lucent lesion in the left first rib with trabecular coarsening. This has mixed but mostly high T2 signal on the MRI from 11/27/2021 and could well be a hemangioma but is not entirely technically specific. No adjacent soft tissue mass. This might be further characterized with whole-body bone scan or MRI of the left upper chest with attention to the left first rib. 5. Atherosclerosis. 6. Cervical spondylosis and degenerative disc disease causing bilateral osseous foraminal stenosis at C3-4 and mild left foraminal stenosis at C6-7. Electronically Signed   By: Van Clines M.D.   On: 07/04/2022 18:05   CT CHEST ABDOMEN PELVIS WO CONTRAST  Result Date: 07/04/2022 CLINICAL DATA:  Altered behavior, possible medication overdose. Anti coagulation. Fall on 06/24/2022. Dialysis this morning. New rib fractures on chest radiography today. EXAM: CT CHEST, ABDOMEN AND PELVIS WITHOUT CONTRAST TECHNIQUE: Multidetector CT imaging of the  chest, abdomen and pelvis was performed following the standard protocol without IV contrast. RADIATION DOSE REDUCTION: This exam was performed according  to the departmental dose-optimization program which includes automated exposure control, adjustment of the mA and/or kV according to patient size and/or use of iterative reconstruction technique. COMPARISON:  Multiple exams, including 06/24/2022 and 04/23/2021 FINDINGS: CT CHEST FINDINGS Cardiovascular: Atherosclerotic calcification of the thoracic aorta and coronary arteries along with cardiomegaly. Substantially enlarged but stable main pulmonary artery and right atrium. There are some pericardial and epicardial calcifications along with some venous calcifications probably related to prior indwelling catheters. Mediastinum/Nodes: No pathologic adenopathy. Lungs/Pleura: Mildly improved atelectasis in the right lower lobe compared to the 06/24/2022 exam. Peribronchovascular reticulonodular and tree-in-bud opacities in the left upper lobe for example on image 54 series 4, likely from atypical infectious process. Continued substantial volume loss and some consolidation in the left lower lobe, roughly similar to prior, encompassing most of the left lower lobe. Musculoskeletal: There are a variety of newly visible right rib fractures compared to the prior exam. These include fractures of the right third, fourth, fifth, sixth, and seventh ribs. The fourth, fifth, sixth, and seventh rib fractures are segmental, with the fourth, fifth, and sixth rib fractures being multi segmental (fractures in 3 locations in each rib). However, for the most part the rib fractures are nondisplaced or only minimally displaced. No thoracic spine fracture is identified. CT ABDOMEN PELVIS FINDINGS Hepatobiliary: Unremarkable.  Contracted gallbladder. Pancreas: Unremarkable Spleen: Unremarkable Adrenals/Urinary Tract: Innumerable cysts of varying complexity throughout both kidneys compatible  with autosomal dominant polycystic kidney disease, similar distribution to prior. Adrenal glands unremarkable. Urinary bladder empty. Small punctate calcification along the renal collecting systems likely mostly vascular although a component of nonobstructive renal calculi are difficult to exclude particularly on the left side. Stomach/Bowel: Prior sleeve gastrectomy. Prominent stool throughout the colon favors constipation. No dilated small bowel. Normal appendix. Vascular/Lymphatic: Atherosclerosis is present, including aortoiliac atherosclerotic disease. No pathologic adenopathy. Reproductive: Fiducials along the prostate gland. Other: No supplemental non-categorized findings. Musculoskeletal: Right total hip prosthesis. Lower lumbar degenerative facet arthropathy probably with facet joint effusions at L4-5 bilaterally. Umbilical hernia contains adipose tissue. Moderate degenerative arthropathy of the left hip. IMPRESSION: 1. There are a variety of newly visible right rib fractures compared to the prior exam. These include fractures of the right third, fourth, fifth, sixth, and seventh ribs. The fourth, fifth, and sixth rib fractures are segmental, with the fourth, fifth, and sixth rib fractures being multi segmental. However, for the most part the rib fractures are nondisplaced or only minimally displaced. No pneumothorax or substantial pleural effusion. 2. Continued substantial volume loss and some consolidation in the left lower lobe, roughly similar to prior. Much of this is chronic back through 2016. 3. Peribronchovascular reticulonodular and tree-in-bud opacities in the left upper lobe, likely from atypical infectious process. 4. Cardiomegaly with considerable enlargement of the main pulmonary artery and right atrium, suggesting pulmonary arterial hypertension. 5. Prominent stool throughout the colon favors constipation. 6. Autosomal dominant polycystic kidney disease. Cannot exclude punctate nonobstructive  left nephrolithiasis. 7. Umbilical hernia contains adipose tissue. 8. Moderate degenerative arthropathy of the left hip. 9. Lower lumbar degenerative facet arthropathy probably with facet joint effusions at L4-5 bilaterally. 10. Aortic atherosclerosis. Aortic Atherosclerosis (ICD10-I70.0). Electronically Signed   By: Van Clines M.D.   On: 07/04/2022 17:46   DG Chest Portable 1 View  Result Date: 07/04/2022 CLINICAL DATA:  Altered mental status.  Right-sided chest pain. EXAM: PORTABLE CHEST 1 VIEW COMPARISON:  11/09/2018 radiography.  Chest CT 06/24/2022 FINDINGS: Previous median sternotomy. Cardiomegaly. Tortuous aorta. The patient has taken a poor  inspiration. Allowing for that, there may be mild volume loss at both lung bases. Upper lobes appear clear. No visible effusion. There are multiple rib fractures on the right which are new since the chest CT of 10 days ago, including ribs 2 through 7. No pneumothorax. IMPRESSION: 1. Multiple rib fractures on the right which are new since the chest CT of 10 days ago. No pneumothorax. 2. Poor inspiration. Allowing for that, there may be mild volume loss at both lung bases. Electronically Signed   By: Nelson Chimes M.D.   On: 07/04/2022 15:49      Assessment/Plan This is a 60 yo male with multiple medical comorbidities presenting with altered mental status and a history of recent falls, now with new acute rib fractures on the right side. These are presumably secondary to a recent fall, although the timing is unclear. No associated pneumo- or hemothorax. - Recommend multimodal pain control and aggressive pulmonary toilet - Monitor respiratory function closely - No indication for chest tube at this time - No intracranial bleeding or solid organ injuries on imaging workup. Ok to resume anticoagulation when deemed appropriate by primary team. - Medical admission for workup of AMS. - Trauma will follow   Michaelle Birks, [address]  Surgery General, Hepatobiliary and Pancreatic Surgery 07/04/22 8:39 PM

## 2022-07-04 NOTE — ED Provider Notes (Signed)
Received patient in turnover from Dr. Gilford Raid.  Please see their note for further details of Hx, PE.  Briefly patient is a 61 y.o. male with a Altered Mental Status .  The patient was found to have likely new right-sided rib fractures when compared to recent CT imaging.  Had suffered a recent fall.  Suspicion for recurrent fall.  The patient was made a level 2 trauma sent back for CT imaging.  Current plan is to reassess post CT.  CT scan of the head and spine without obvious acute findings.  Patient CT scan of the chest with multiple new rib fractures some with multiple breaks.  No pneumothorax.  Patient has no appreciable tachypnea and is not hypoxic on my exam.  Family was concerned for possible pneumonia as he has been coughing quite a bit for the past 3 days.  No obvious new signs of pneumonia on CT imaging.  I discussed the case with trauma, Dr. Zenia Resides recommended medical admission and trauma would formally consult.  The patients results and plan were reviewed and discussed.   Any x-rays performed were independently reviewed by myself.   Differential diagnosis were considered with the presenting HPI.  Medications  fentaNYL (SUBLIMAZE) injection 50 mcg (has no administration in time range)    Vitals:   07/04/22 1620 07/04/22 1710 07/04/22 1735 07/04/22 1800  BP:  102/68 91/63 101/67  Pulse:  (!) 104 (!) 102 (!) 102  Resp:  '16 15 13  '$ Temp:      TempSrc:      SpO2:  95% 95% 97%  Weight: (!) 142.9 kg     Height: '6\' 6"'$  (1.981 m)       Final diagnoses:  ESRD on hemodialysis (HCC)  Closed fracture of multiple ribs of right side, initial encounter    Admission/ observation were discussed with the admitting physician, patient and/or family and they are comfortable with the plan.      Deno Etienne, DO 07/04/22 [phone]

## 2022-07-04 NOTE — ED Notes (Signed)
[facility]aughters phone number [phone] and requests to be called if anything happens

## 2022-07-04 NOTE — ED Triage Notes (Signed)
Per EMS patient is coming in for altered behavior as claimed by wife. Per wife/ ems patient has not been acting "normal" for the last couple of weeks. Per EMS patient goes to dialysis. Per EMS patient may have taken too many of his home medications today (oxycodone--narcan--).

## 2022-07-04 NOTE — ED Notes (Signed)
Patient transported to CT by TRN Amanda at this time.

## 2022-07-04 NOTE — ED Provider Notes (Signed)
[facility] EMERGENCY DEPARTMENT Provider Note   CSN: [id number] Arrival date & time: 07/04/22  1511     History  Chief Complaint  Patient presents with   Altered Mental Status    [name] is a 61 y.o. male.  Pt is a 61 yo male with a pmhx significant for arthritis, heart transplant in 2014, DM2, HLD, OSA, HTN, obesity, ESRD (on HD MWF), prostate cancer, CVA, neuropathy, and afib on Eliquis.  Pt'w wife called EMS today because he's not been acting normal for a few weeks.  Pt did fall on 10/29 and was seen here.  CT head neg.  Pt had dialysis this morning.  He came back from dialysis and took a few of his oxycodone pills.  He thought he may have taken too much, so he took 4 mg narcan IN.  His wife told EMS that he's been more irritable and aggressive lately.  He denies any current pain or sob.  Pt no longer urinates.  Pt does not know if he's fallen again.       Home Medications Prior to Admission medications   Medication Sig Start Date End Date Taking? Authorizing Provider  B Complex-C-Folic Acid (RENO CAPS) 1 MG CAPS Take 1 mg by mouth daily. 08/09/17   [provider]  cinacalcet (SENSIPAR) 60 MG tablet Take 60 mg by mouth daily. 04/19/21   [provider]  citalopram (CELEXA) 20 MG tablet Take 20 mg by mouth daily. 04/06/20   [provider]  cycloSPORINE modified (NEORAL) 100 MG capsule Take 100 mg by mouth See admin instructions. Take with three 25 mg  (75 ) for a total of 175 mg twice a day    [provider]  cycloSPORINE modified (NEORAL) 25 MG capsule Take 75 mg by mouth See admin instructions. Take With 100 mg tab for a total of 175 mg twice a day    [provider]  ELIQUIS 2.5 MG TABS tablet Take 1 tablet (2.5 mg total) by mouth 2 (two) times daily. 04/30/21   [name], MD  lidocaine (LIDODERM) 5 % Place 1 patch onto the skin daily. Remove & Discard patch within 12 hours or as directed by MD 06/24/22    Godfrey Pick, MD  lidocaine (XYLOCAINE) 5 % ointment Apply 1 application topically as needed. 04/14/19   Jamse Arn, MD  lidocaine-prilocaine (EMLA) cream Apply 1 application topically as needed.    [provider]  methocarbamol (ROBAXIN) 500 MG tablet Take 1 tablet (500 mg total) by mouth every 8 (eight) hours as needed for muscle spasms. 06/24/22   Godfrey Pick, MD  midodrine (PROAMATINE) 10 MG tablet Take 1 tablet (10 mg total) by mouth 3 (three) times daily. 04/27/21   [name], MD  mycophenolate (MYFORTIC) 180 MG EC tablet Take 720 mg by mouth 2 (two) times daily.    [provider]  ondansetron (ZOFRAN) 4 MG tablet Take 1 tablet (4 mg total) by mouth every 6 (six) hours as needed for nausea. 07/23/17   Swinteck, Aaron Edelman, MD  OXcarbazepine (TRILEPTAL) 150 MG tablet Take 1 tablet (150 mg total) by mouth 2 (two) times daily. 10/12/21   Marcial Pacas, MD  oxybutynin (DITROPAN) 5 MG tablet Take 1 tablet (5 mg total) by mouth every 8 (eight) hours as needed for bladder spasms. 04/23/21   Davonna Belling, MD  pregabalin (LYRICA) 25 MG capsule TAKE 5 CAPSULES BY MOUTH AFTER DIALYSIS 06/09/20   Jamse Arn,  MD  pregabalin (LYRICA) 75 MG capsule TAKE 1 CAPSULE(75 MG) BY MOUTH DAILY Patient taking differently: Take 100 mg by mouth daily. 07/12/20   Jamse Arn, MD  sevelamer carbonate (RENVELA) 800 MG tablet Take 2 tablets (1,600 mg total) by mouth 3 (three) times daily with meals. 11/11/18   Kayleen Memos, DO      Allergies    Heparin, Iodinated contrast media, and Penicillin g potassium [penicillin g]    Review of Systems   Review of Systems  All other systems reviewed and are negative.   Physical Exam Updated [name] Signs BP 101/65   Pulse (!) 120   Temp 99.4 F (37.4 C) (Oral)   Resp 17   SpO2 95%  Physical Exam Vitals and nursing note reviewed.  Constitutional:      Appearance: Normal appearance. He is obese.  HENT:     Head: Normocephalic and  atraumatic.     Right Ear: External ear normal.     Left Ear: External ear normal.     Nose: Nose normal.     Mouth/Throat:     Mouth: Mucous membranes are dry.  Eyes:     Extraocular Movements: Extraocular movements intact.     Conjunctiva/sclera: Conjunctivae normal.     Pupils: Pupils are equal, round, and reactive to light.  Cardiovascular:     Rate and Rhythm: Regular rhythm. Tachycardia present.     Pulses: Normal pulses.     Heart sounds: Normal heart sounds.  Pulmonary:     Effort: Pulmonary effort is normal.     Breath sounds: Normal breath sounds.  Abdominal:     General: Abdomen is flat. Bowel sounds are normal.     Palpations: Abdomen is soft.  Musculoskeletal:        General: Normal range of motion.     Cervical back: Normal range of motion and neck supple.     Comments: Right leg in boot.  Boot removed and skin examined.  No abn.  Boot put back on.  Right upper arm AVF + thrill  Skin:    General: Skin is warm.     Capillary Refill: Capillary refill takes less than 2 seconds.  Neurological:     General: No focal deficit present.     Mental Status: He is alert.     Comments: Pt knows his name and year, but thinks [name] is the president.  Psychiatric:        Mood and Affect: Mood normal.        Behavior: Behavior normal.     ED Results / Procedures / Treatments   Labs (all labs ordered are listed, but only abnormal results are displayed) Labs Reviewed  CBC WITH DIFFERENTIAL/PLATELET - Abnormal; Notable for the following components:      Result Value   RBC 3.31 (*)    Hemoglobin 9.7 (*)    HCT 30.5 (*)    RDW 15.6 (*)    All other components within normal limits  COMPREHENSIVE METABOLIC PANEL - Abnormal; Notable for the following components:   Chloride 92 (*)    Glucose, Bld 113 (*)    Creatinine, Ser 7.10 (*)    Albumin 3.1 (*)    Total Bilirubin 1.9 (*)    GFR, Estimated 8 (*)    Anion gap 19 (*)    All other components within normal limits   CBG MONITORING, ED - Abnormal; Notable for the following components:   Glucose-Capillary 114 (*)  All other components within normal limits  LACTIC ACID, PLASMA    EKG EKG Interpretation  Date/Time:  Wednesday July 04 2022 15:20:01 EST Ventricular Rate:  119 PR Interval:  91 QRS Duration: 156 QT Interval:  400 QTC Calculation: 563 R Axis:   -57 Text Interpretation: Sinus or ectopic atrial tachycardia Ventricular premature complex RBBB and LAFB Inferior infarct, acute (RCA) Lateral leads are also involved Probable RV involvement, suggest recording right precordial leads Since last tracing rate faster Confirmed by Isla Pence [phone]) on 07/04/2022 3:28:40 PM  Radiology DG Chest Portable 1 View  Result Date: 07/04/2022 CLINICAL DATA:  Altered mental status.  Right-sided chest pain. EXAM: PORTABLE CHEST 1 VIEW COMPARISON:  11/09/2018 radiography.  Chest CT 06/24/2022 FINDINGS: Previous median sternotomy. Cardiomegaly. Tortuous aorta. The patient has taken a poor inspiration. Allowing for that, there may be mild volume loss at both lung bases. Upper lobes appear clear. No visible effusion. There are multiple rib fractures on the right which are new since the chest CT of 10 days ago, including ribs 2 through 7. No pneumothorax. IMPRESSION: 1. Multiple rib fractures on the right which are new since the chest CT of 10 days ago. No pneumothorax. 2. Poor inspiration. Allowing for that, there may be mild volume loss at both lung bases. Electronically Signed   By: Nelson Chimes M.D.   On: 07/04/2022 15:49    Procedures Procedures    Medications Ordered in ED Medications - No data to display  ED Course/ Medical Decision Making/ A&P                           Medical Decision Making Amount and/or Complexity of Data Reviewed Labs: ordered. Radiology: ordered.   This patient presents to the ED for concern of ams, this involves an extensive number of treatment options, and is a  complaint that carries with it a high risk of complications and morbidity.  The differential diagnosis includes electrolyte abn   Co morbidities that complicate the patient evaluation   arthritis, heart transplant in 2014, DM, HLD, OSA, HTN, obesity, ESRD (on HD), prostate cancer, CVA, neuropathy, afib   Additional history obtained:  Additional history obtained from epic chart review External records from outside source obtained and reviewed including EMS report   Lab Tests:  I Ordered, and personally interpreted labs.  The pertinent results include:  cbc with hgb 9.7 (hgb 10.9 on 10/29), cmp nl other than cr 7.10, lactic is 1.6   Imaging Studies ordered:  I ordered imaging studies including cxr, ct head, ct chest/abd/pelvis  I independently visualized and interpreted imaging which showed  CXR: IMPRESSION: 1. Multiple rib fractures on the right which are new since the chest CT of 10 days ago. No pneumothorax. 2. Poor inspiration. Allowing for that, there may be mild volume loss at both lung bases.  I agree with the radiologist interpretation   Cardiac Monitoring:  The patient was maintained on a cardiac monitor.  I personally viewed and interpreted the cardiac monitored which showed an underlying rhythm of: st   Medicines ordered and prescription drug management:   I have reviewed the patients home medicines and have made adjustments as needed   Problem List / ED Course:  AMS:  pt has a CXR with multiple new rib fx.  Due to this and the AMS, I leveled him to a level 2 trauma.  Pt signed out to Dr. Tyrone Nine at shift change.  Reevaluation:  After the interventions noted above, I reevaluated the patient and found that they have :stayed the same   Social Determinants of Health:  Lives at home   Dispostion:  pending        Final Clinical Impression(s) / ED Diagnoses Final diagnoses:  ESRD on hemodialysis ([place])  Closed fracture of multiple ribs of right  side, initial encounter    Rx / DC Orders ED Discharge Orders     None         Isla Pence, MD 07/04/22 1629

## 2022-07-05 ENCOUNTER — Observation Stay (HOSPITAL_COMMUNITY): Payer: Medicare Other

## 2022-07-05 DIAGNOSIS — G9340 Encephalopathy, unspecified: Secondary | ICD-10-CM | POA: Diagnosis not present

## 2022-07-05 DIAGNOSIS — T402X1A Poisoning by other opioids, accidental (unintentional), initial encounter: Secondary | ICD-10-CM | POA: Diagnosis not present

## 2022-07-05 DIAGNOSIS — J9811 Atelectasis: Secondary | ICD-10-CM | POA: Diagnosis not present

## 2022-07-05 DIAGNOSIS — S2241XA Multiple fractures of ribs, right side, initial encounter for closed fracture: Secondary | ICD-10-CM | POA: Diagnosis not present

## 2022-07-05 DIAGNOSIS — S2231XA Fracture of one rib, right side, initial encounter for closed fracture: Secondary | ICD-10-CM | POA: Diagnosis not present

## 2022-07-05 LAB — COMPREHENSIVE METABOLIC PANEL
ALT: 18 U/L (ref 0–44)
AST: 16 U/L (ref 15–41)
Albumin: 2.7 g/dL — ABNORMAL LOW (ref 3.5–5.0)
Alkaline Phosphatase: 78 U/L (ref 38–126)
Anion gap: 16 — ABNORMAL HIGH (ref 5–15)
BUN: 31 mg/dL — ABNORMAL HIGH (ref 8–23)
CO2: 28 mmol/L (ref 22–32)
Calcium: 8.6 mg/dL — ABNORMAL LOW (ref 8.9–10.3)
Chloride: 94 mmol/L — ABNORMAL LOW (ref 98–111)
Creatinine, Ser: 8.69 mg/dL — ABNORMAL HIGH (ref 0.61–1.24)
GFR, Estimated: 6 mL/min — ABNORMAL LOW (ref >60)
Glucose, Bld: 94 mg/dL (ref 70–99)
Potassium: 4.1 mmol/L (ref 3.5–5.1)
Sodium: 138 mmol/L (ref 135–145)
Total Bilirubin: 1.8 mg/dL — ABNORMAL HIGH (ref 0.3–1.2)
Total Protein: 6.5 g/dL (ref 6.5–8.1)

## 2022-07-05 LAB — CBC
HCT: 28 % — ABNORMAL LOW (ref 39.0–52.0)
Hemoglobin: 9.2 g/dL — ABNORMAL LOW (ref 13.0–17.0)
MCH: 29.8 pg (ref 26.0–34.0)
MCHC: 32.9 g/dL (ref 30.0–36.0)
MCV: 90.6 fL (ref 80.0–100.0)
Platelets: 185 10*3/uL (ref 150–400)
RBC: 3.09 MIL/uL — ABNORMAL LOW (ref 4.22–5.81)
RDW: 15.6 % — ABNORMAL HIGH (ref 11.5–15.5)
WBC: 13.7 10*3/uL — ABNORMAL HIGH (ref 4.0–10.5)
nRBC: 0 % (ref 0.0–0.2)

## 2022-07-05 LAB — GLUCOSE, CAPILLARY: Glucose-Capillary: 106 mg/dL — ABNORMAL HIGH (ref 70–99)

## 2022-07-05 LAB — CBG MONITORING, ED
Glucose-Capillary: 93 mg/dL (ref 70–99)
Glucose-Capillary: 112 mg/dL — ABNORMAL HIGH (ref 70–99)

## 2022-07-05 LAB — HEPATITIS B SURFACE ANTIGEN: Hepatitis B Surface Ag: NONREACTIVE

## 2022-07-05 MED ORDER — ACETAMINOPHEN 650 MG RE SUPP
650.0000 mg | Freq: Four times a day (QID) | RECTAL | Status: DC
  Filled 2022-07-05: qty 1

## 2022-07-05 MED ORDER — ACETAMINOPHEN 325 MG PO TABS
650.0000 mg | ORAL_TABLET | Freq: Four times a day (QID) | ORAL | Status: DC
  Administered 2022-07-05 – 2022-07-06 (×4): 650 mg via ORAL
  Filled 2022-07-05 (×4): qty 2

## 2022-07-05 MED ORDER — OXYCODONE HCL 5 MG PO TABS: 5.0000 mg | ORAL_TABLET | ORAL | Status: DC | PRN

## 2022-07-05 MED ORDER — CHLORHEXIDINE GLUCONATE CLOTH 2 % EX PADS: 6.0000 | MEDICATED_PAD | Freq: Every day | CUTANEOUS | Status: DC

## 2022-07-05 MED ORDER — HYDROMORPHONE HCL 1 MG/ML IJ SOLN: 0.5000 mg | INTRAMUSCULAR | Status: DC | PRN

## 2022-07-05 MED ORDER — GUAIFENESIN ER 600 MG PO TB12
600.0000 mg | ORAL_TABLET | Freq: Two times a day (BID) | ORAL | Status: DC
  Administered 2022-07-05 (×2): 600 mg via ORAL
  Filled 2022-07-05 (×2): qty 1

## 2022-07-05 MED ORDER — APIXABAN 2.5 MG PO TABS
2.5000 mg | ORAL_TABLET | Freq: Two times a day (BID) | ORAL | Status: DC
  Administered 2022-07-05 (×2): 2.5 mg via ORAL
  Filled 2022-07-05 (×2): qty 1

## 2022-07-05 MED ORDER — LIDOCAINE 5 % EX PTCH
1.0000 | MEDICATED_PATCH | CUTANEOUS | Status: DC
  Administered 2022-07-05: 1 via TRANSDERMAL
  Filled 2022-07-05: qty 1

## 2022-07-05 NOTE — Progress Notes (Signed)
[facility] Progress Note     Subjective: CC-  Denies any current pain from rib fractures. Denies SOB. On 4L [place]. Pulling 1000 on IS. Has only had tylenol since prior to midnight. Has not yet been OOB. Denies noting any new injuries.  Objective: Vital signs in last 24 hours: Temp:  [97.8 F (36.6 C)-102.5 F (39.2 C)] 97.8 F (36.6 C) (11/09 0557) Pulse Rate:  [95-120] 95 (11/09 0631) Resp:  [13-22] 17 (11/09 0631) BP: (81-120)/(49-87) 92/59 (11/09 0631) SpO2:  [88 %-98 %] 91 % (11/09 0631) Weight:  [142.9 kg] 142.9 kg (11/08 1620) Last BM Date : 07/03/22  Intake/Output from previous day: No intake/output data recorded. Intake/Output this shift: No intake/output data recorded.  PE: Gen:  Alert, NAD, pleasant Card:  RRR Pulm:  CTAB, no W/R/R, rate and effort normal on 4L [place] Abd: Soft, NT/ND Ext:  CAM boot to RLE  Lab Results:  Recent Labs    07/04/22 1529 07/05/22 0433  WBC 9.6 13.7*  HGB 9.7* 9.2*  HCT 30.5* 28.0*  PLT 196 185   BMET Recent Labs    07/04/22 1529 07/05/22 0433  NA 140 138  K 3.8 4.1  CL 92* 94*  CO2 29 28  GLUCOSE 113* 94  BUN 23 31*  CREATININE 7.10* 8.69*  CALCIUM 9.2 8.6*   PT/INR No results for input(s): "LABPROT", "INR" in the last 72 hours. CMP     Component Value Date/Time   NA 138 07/05/2022 0433   K 4.1 07/05/2022 0433   CL 94 (L) 07/05/2022 0433   CO2 28 07/05/2022 0433   GLUCOSE 94 07/05/2022 0433   BUN 31 (H) 07/05/2022 0433   CREATININE 8.69 (H) 07/05/2022 0433   CALCIUM 8.6 (L) 07/05/2022 0433   PROT 6.5 07/05/2022 0433   ALBUMIN 2.7 (L) 07/05/2022 0433   AST 16 07/05/2022 0433   ALT 18 07/05/2022 0433   ALKPHOS 78 07/05/2022 0433   BILITOT 1.8 (H) 07/05/2022 0433   GFRNONAA 6 (L) 07/05/2022 0433   GFRAA 8 (L) 11/11/2018 0432   Lipase     Component Value Date/Time   LIPASE 45 10/21/2018 1526       Studies/Results: DG Foot 2 Views Left  Result Date: 07/04/2022 CLINICAL DATA:  Fall  EXAM: LEFT FOOT - 2 VIEW COMPARISON:  12/23/2017 FINDINGS: Vascular calcifications. Removal of previously noted surgical pin from the second digit. Fixating screw across the fourth PIP joint with osseous fusion. Defect base of the second distal phalanx with smooth sclerotic margin suggesting chronic deformity. There appears to be resection of the second middle phalanx and the distal aspect of the second proximal phalanx. IMPRESSION: 1. No definitive acute osseous abnormality. 2. Postsurgical changes of the second and fourth digits. Chronic appearing deformity at the second distal removal of previously noted fixating pin in the second digit with apparent interval resection of the distal aspect second proximal phalanx as well as the middle phalanx of second digit. Chronic appearing defect base of the second distal phalanx. Electronically Signed   By: Donavan Foil M.D.   On: 07/04/2022 20:31   DG Foot 2 Views Right  Result Date: 07/04/2022 CLINICAL DATA:  Fall EXAM: RIGHT FOOT - 2 VIEW COMPARISON:  None Available. FINDINGS: Bones appear demineralized. Mild degenerative change at the first MTP joint. Malalignment at the second TMT joint with lateral subluxation of the base of the metatarsal. Heterogeneous sclerosis at the base of the second and probably third metatarsals but slightly limited  assessment due to positioning and osseous superimposition. Vascular calcifications. Possible old deformity at the distal fibula on lateral view. Dorsal degenerative change at the TMT joints. IMPRESSION: 1. Appearance of malalignment at the second and probably third TMT joints as may be seen with Lisfranc injury. Apparent heterogeneous sclerosis at the base of the second and probably third metatarsal bases suggesting chronic fracture Electronically Signed   By: Donavan Foil M.D.   On: 07/04/2022 20:28   CT Head Wo Contrast  Result Date: 07/04/2022 CLINICAL DATA:  Interval trauma with new right rib fractures. Possible falls.  Altered mental status. EXAM: CT HEAD WITHOUT CONTRAST CT CERVICAL SPINE WITHOUT CONTRAST TECHNIQUE: Multidetector CT imaging of the head and cervical spine was performed following the standard protocol without intravenous contrast. Multiplanar CT image reconstructions of the cervical spine were also generated. RADIATION DOSE REDUCTION: This exam was performed according to the departmental dose-optimization program which includes automated exposure control, adjustment of the mA and/or kV according to patient size and/or use of iterative reconstruction technique. COMPARISON:  06/24/2022 FINDINGS: CT HEAD FINDINGS Brain: Stable appearance of encephalomalacia from remote right frontal lobe infarct, no change or progression. Periventricular white matter and corona radiata hypodensities favor chronic ischemic microvascular white matter disease. Otherwise, the brainstem, cerebellum, cerebral peduncles, thalamus, basal ganglia, basilar cisterns, and ventricular system appear within normal limits. No intracranial hemorrhage, mass lesion, or acute CVA. Vascular: There is atherosclerotic calcification of the cavernous carotid arteries bilaterally. Skull: Unremarkable Sinuses/Orbits: Unremarkable Other: No supplemental non-categorized findings. CT CERVICAL SPINE FINDINGS Alignment: 3 mm chronic degenerative anterolisthesis at C2-3. Loss of the normal cervical lordosis, which can be associated with muscle spasm. Skull base and vertebrae: Fused left facet joint at C3-4 along with some probable mild interbody fusion this level. Reduced signal to noise ratio in the lower cervical spine due to body habitus. Stable spurring and possible small erosions versus degenerative subcortical cysts at the anterior C1-2 articulation. Paragraphs there is loss of intervertebral disc height along with mild endplate irregularity at all levels between C2 and C7, as before, compatible with degenerative disc disease and degenerative endplate findings.  No cervical spine fracture or acute bony findings identified. Soft tissues and spinal canal: Unremarkable Disc levels: Bilateral osseous foraminal stenosis at C3-4 due to uncinate spurring. There is likely mild left foraminal stenosis at C6-7 due to uncinate spurring. Upper chest: Lung apices appear clear. Other: Lucent lesion in the left first rib with trabecular coarsening as shown on image 64 series 4. This had mixed but mostly high T2 signal on the MRI from 11/27/2021 and could well be a hemangioma but is not entirely technically specific. This lesion is fairly indistinct on radiography condom, manifesting only is faint speckled density on chest radiographs, and is difficult to corroborate on older chest radiograph studies. A lytic lesion of bone such as a metastatic lesion is considered less likely but not entirely excluded given the limited characterization of this lesion. There is no adjacent soft tissue mass. IMPRESSION: 1. No acute intracranial findings or acute cervical spine findings. 2. Stable encephalomalacia from remote right frontal lobe infarct. 3. Periventricular white matter and corona radiata hypodensities favor chronic ischemic microvascular white matter disease. 4. Lucent lesion in the left first rib with trabecular coarsening. This has mixed but mostly high T2 signal on the MRI from 11/27/2021 and could well be a hemangioma but is not entirely technically specific. No adjacent soft tissue mass. This might be further characterized with whole-body bone scan or MRI of  the left upper chest with attention to the left first rib. 5. Atherosclerosis. 6. Cervical spondylosis and degenerative disc disease causing bilateral osseous foraminal stenosis at C3-4 and mild left foraminal stenosis at C6-7. Electronically Signed   By: Van Clines M.D.   On: 07/04/2022 18:05   CT Cervical Spine Wo Contrast  Result Date: 07/04/2022 CLINICAL DATA:  Interval trauma with new right rib fractures. Possible  falls. Altered mental status. EXAM: CT HEAD WITHOUT CONTRAST CT CERVICAL SPINE WITHOUT CONTRAST TECHNIQUE: Multidetector CT imaging of the head and cervical spine was performed following the standard protocol without intravenous contrast. Multiplanar CT image reconstructions of the cervical spine were also generated. RADIATION DOSE REDUCTION: This exam was performed according to the departmental dose-optimization program which includes automated exposure control, adjustment of the mA and/or kV according to patient size and/or use of iterative reconstruction technique. COMPARISON:  06/24/2022 FINDINGS: CT HEAD FINDINGS Brain: Stable appearance of encephalomalacia from remote right frontal lobe infarct, no change or progression. Periventricular white matter and corona radiata hypodensities favor chronic ischemic microvascular white matter disease. Otherwise, the brainstem, cerebellum, cerebral peduncles, thalamus, basal ganglia, basilar cisterns, and ventricular system appear within normal limits. No intracranial hemorrhage, mass lesion, or acute CVA. Vascular: There is atherosclerotic calcification of the cavernous carotid arteries bilaterally. Skull: Unremarkable Sinuses/Orbits: Unremarkable Other: No supplemental non-categorized findings. CT CERVICAL SPINE FINDINGS Alignment: 3 mm chronic degenerative anterolisthesis at C2-3. Loss of the normal cervical lordosis, which can be associated with muscle spasm. Skull base and vertebrae: Fused left facet joint at C3-4 along with some probable mild interbody fusion this level. Reduced signal to noise ratio in the lower cervical spine due to body habitus. Stable spurring and possible small erosions versus degenerative subcortical cysts at the anterior C1-2 articulation. Paragraphs there is loss of intervertebral disc height along with mild endplate irregularity at all levels between C2 and C7, as before, compatible with degenerative disc disease and degenerative endplate  findings. No cervical spine fracture or acute bony findings identified. Soft tissues and spinal canal: Unremarkable Disc levels: Bilateral osseous foraminal stenosis at C3-4 due to uncinate spurring. There is likely mild left foraminal stenosis at C6-7 due to uncinate spurring. Upper chest: Lung apices appear clear. Other: Lucent lesion in the left first rib with trabecular coarsening as shown on image 64 series 4. This had mixed but mostly high T2 signal on the MRI from 11/27/2021 and could well be a hemangioma but is not entirely technically specific. This lesion is fairly indistinct on radiography condom, manifesting only is faint speckled density on chest radiographs, and is difficult to corroborate on older chest radiograph studies. A lytic lesion of bone such as a metastatic lesion is considered less likely but not entirely excluded given the limited characterization of this lesion. There is no adjacent soft tissue mass. IMPRESSION: 1. No acute intracranial findings or acute cervical spine findings. 2. Stable encephalomalacia from remote right frontal lobe infarct. 3. Periventricular white matter and corona radiata hypodensities favor chronic ischemic microvascular white matter disease. 4. Lucent lesion in the left first rib with trabecular coarsening. This has mixed but mostly high T2 signal on the MRI from 11/27/2021 and could well be a hemangioma but is not entirely technically specific. No adjacent soft tissue mass. This might be further characterized with whole-body bone scan or MRI of the left upper chest with attention to the left first rib. 5. Atherosclerosis. 6. Cervical spondylosis and degenerative disc disease causing bilateral osseous foraminal stenosis at C3-4 and  mild left foraminal stenosis at C6-7. Electronically Signed   By: Van Clines M.D.   On: 07/04/2022 18:05   CT CHEST ABDOMEN PELVIS WO CONTRAST  Result Date: 07/04/2022 CLINICAL DATA:  Altered behavior, possible medication  overdose. Anti coagulation. Fall on 06/24/2022. Dialysis this morning. New rib fractures on chest radiography today. EXAM: CT CHEST, ABDOMEN AND PELVIS WITHOUT CONTRAST TECHNIQUE: Multidetector CT imaging of the chest, abdomen and pelvis was performed following the standard protocol without IV contrast. RADIATION DOSE REDUCTION: This exam was performed according to the departmental dose-optimization program which includes automated exposure control, adjustment of the mA and/or kV according to patient size and/or use of iterative reconstruction technique. COMPARISON:  Multiple exams, including 06/24/2022 and 04/23/2021 FINDINGS: CT CHEST FINDINGS Cardiovascular: Atherosclerotic calcification of the thoracic aorta and coronary arteries along with cardiomegaly. Substantially enlarged but stable main pulmonary artery and right atrium. There are some pericardial and epicardial calcifications along with some venous calcifications probably related to prior indwelling catheters. Mediastinum/Nodes: No pathologic adenopathy. Lungs/Pleura: Mildly improved atelectasis in the right lower lobe compared to the 06/24/2022 exam. Peribronchovascular reticulonodular and tree-in-bud opacities in the left upper lobe for example on image 54 series 4, likely from atypical infectious process. Continued substantial volume loss and some consolidation in the left lower lobe, roughly similar to prior, encompassing most of the left lower lobe. Musculoskeletal: There are a variety of newly visible right rib fractures compared to the prior exam. These include fractures of the right third, fourth, fifth, sixth, and seventh ribs. The fourth, fifth, sixth, and seventh rib fractures are segmental, with the fourth, fifth, and sixth rib fractures being multi segmental (fractures in 3 locations in each rib). However, for the most part the rib fractures are nondisplaced or only minimally displaced. No thoracic spine fracture is identified. CT ABDOMEN  PELVIS FINDINGS Hepatobiliary: Unremarkable.  Contracted gallbladder. Pancreas: Unremarkable Spleen: Unremarkable Adrenals/Urinary Tract: Innumerable cysts of varying complexity throughout both kidneys compatible with autosomal dominant polycystic kidney disease, similar distribution to prior. Adrenal glands unremarkable. Urinary bladder empty. Small punctate calcification along the renal collecting systems likely mostly vascular although a component of nonobstructive renal calculi are difficult to exclude particularly on the left side. Stomach/Bowel: Prior sleeve gastrectomy. Prominent stool throughout the colon favors constipation. No dilated small bowel. Normal appendix. Vascular/Lymphatic: Atherosclerosis is present, including aortoiliac atherosclerotic disease. No pathologic adenopathy. Reproductive: Fiducials along the prostate gland. Other: No supplemental non-categorized findings. Musculoskeletal: Right total hip prosthesis. Lower lumbar degenerative facet arthropathy probably with facet joint effusions at L4-5 bilaterally. Umbilical hernia contains adipose tissue. Moderate degenerative arthropathy of the left hip. IMPRESSION: 1. There are a variety of newly visible right rib fractures compared to the prior exam. These include fractures of the right third, fourth, fifth, sixth, and seventh ribs. The fourth, fifth, and sixth rib fractures are segmental, with the fourth, fifth, and sixth rib fractures being multi segmental. However, for the most part the rib fractures are nondisplaced or only minimally displaced. No pneumothorax or substantial pleural effusion. 2. Continued substantial volume loss and some consolidation in the left lower lobe, roughly similar to prior. Much of this is chronic back through 2016. 3. Peribronchovascular reticulonodular and tree-in-bud opacities in the left upper lobe, likely from atypical infectious process. 4. Cardiomegaly with considerable enlargement of the main pulmonary  artery and right atrium, suggesting pulmonary arterial hypertension. 5. Prominent stool throughout the colon favors constipation. 6. Autosomal dominant polycystic kidney disease. Cannot exclude punctate nonobstructive left nephrolithiasis. 7. Umbilical hernia contains  adipose tissue. 8. Moderate degenerative arthropathy of the left hip. 9. Lower lumbar degenerative facet arthropathy probably with facet joint effusions at L4-5 bilaterally. 10. Aortic atherosclerosis. Aortic Atherosclerosis (ICD10-I70.0). Electronically Signed   By: Van Clines M.D.   On: 07/04/2022 17:46   DG Chest Portable 1 View  Result Date: 07/04/2022 CLINICAL DATA:  Altered mental status.  Right-sided chest pain. EXAM: PORTABLE CHEST 1 VIEW COMPARISON:  11/09/2018 radiography.  Chest CT 06/24/2022 FINDINGS: Previous median sternotomy. Cardiomegaly. Tortuous aorta. The patient has taken a poor inspiration. Allowing for that, there may be mild volume loss at both lung bases. Upper lobes appear clear. No visible effusion. There are multiple rib fractures on the right which are new since the chest CT of 10 days ago, including ribs 2 through 7. No pneumothorax. IMPRESSION: 1. Multiple rib fractures on the right which are new since the chest CT of 10 days ago. No pneumothorax. 2. Poor inspiration. Allowing for that, there may be mild volume loss at both lung bases. Electronically Signed   By: Nelson Chimes M.D.   On: 07/04/2022 15:49    Anti-infectives: Anti-infectives (From admission, onward)    None        Assessment/Plan  Recent fall Multiple right rib fractures 3-7 - Follow up CXR pending. Multimodal pain control and pulm toilet. Schedule tylenol and add lidocaine patch, Oxycodone PRN (looks like he takes oxy '10mg'$  at home). IS and flutter valve. Wean to room air as able. PT/OT consults. We will follow up on CXR and sign off, please call with questions or concerns.  ID - none FEN - renal diet VTE - ok to resume  eliquis Foley - none  MMP including CHF s/p heart transplant, ESRD, A fib on eliquis...  I reviewed hospitalist notes, last 24 h vitals and pain scores, last 48 h intake and output, last 24 h labs and trends, and last 24 h imaging results.    LOS: 0 days    Wellington [place], [facility] 07/05/2022, 8:05 AM Please see Amion for pager number during day hours 7:00am-4:30pm

## 2022-07-05 NOTE — ED Notes (Signed)
POSIE ALARM PLACED ON PATIENT AT THIS TIME

## 2022-07-05 NOTE — Evaluation (Signed)
Physical Therapy Evaluation Patient Details Name: [name] MRN: [id number] DOB: [birth year] Today's Date: 07/05/2022  History of Present Illness  Pt is a 61 y/o male who presented with AMS and falls at home. CT showed R 3-7 rib fx. RSV+. PMH: ESRD on HD MWF, CHF s/p heart transplant, DM2, obesity, peripheral neuropathy, sleep apnea, HTN, a fib, malignant neoplasm of prostate.  Clinical Impression  Patient presents with decreased mobility due to decreased LE strength, decreased balance with significant peripheral neuropathy, decreased activity tolerance with significant orthostatic hypotension and multiple falls at home.  Currently needing mod A for sit to stand, minguard for ambulation and min A for sit to supine.  He was functioning with family support in his home having some difficulty with steps and falling, per daughter, more due to weakness/imbalance than orthostatic symptoms.  Patient with orthostatic hypotension today with limited standing tolerance as noted below.  He will continue to benefit from skilled PT in the acute setting and right now not interested in SNF so recommending HHPT.  Orthostatic VS for the past 24 hrs (Last 3 readings):  BP- Sitting Pulse- Sitting BP- Standing at 0 minutes Pulse- Standing at 0 minutes  07/05/22 1100 (!) 89/61 95 (!) 73/45 99          Recommendations for follow up therapy are one component of a multi-disciplinary discharge planning process, led by the attending physician.  Recommendations may be updated based on patient status, additional functional criteria and insurance authorization.  Follow Up Recommendations Home health PT      Assistance Recommended at Discharge Intermittent Supervision/Assistance  Patient can return home with the following  A little help with walking and/or transfers;Assistance with cooking/housework;A lot of help with bathing/dressing/bathroom;Direct supervision/assist for medications management;Assist for  transportation;Help with stairs or ramp for entrance    Equipment Recommendations None recommended by PT  Recommendations for Other Services       Functional Status Assessment Patient has had a recent decline in their functional status and demonstrates the ability to make significant improvements in function in a reasonable and predictable amount of time.     Precautions / Restrictions Precautions Precautions: Fall Precaution Comments: monitor BP Required Braces or Orthoses: Other Brace Other Brace: R cam boot Restrictions RLE Weight Bearing: Weight bearing as tolerated Other Position/Activity Restrictions: per MD, WBAT w/ cam boot      Mobility  Bed Mobility Overal bed mobility: Needs Assistance Bed Mobility: Sit to Supine       Sit to supine: Min assist   General bed mobility comments: assist to guide legs onto bed and to reposition trunk    Transfers Overall transfer level: Needs assistance Equipment used: Rolling walker (2 wheels) Transfers: Sit to/from Stand Sit to Stand: From elevated surface, Mod assist           General transfer comment: lifting help from edge of stretcher x 3 trials with cues for hand placement    Ambulation/Gait Ambulation/Gait assistance: Min guard Gait Distance (Feet): 50 Feet Assistive device: Rolling walker (2 wheels) Gait Pattern/deviations: Step-to pattern, Step-through pattern, Trunk flexed, Decreased stride length, Wide base of support       General Gait Details: limited distance based on not wanting to fall and had low BP initially with orthostatics  Stairs            Wheelchair Mobility    Modified Rankin (Stroke Patients Only)       Balance Overall balance assessment: Needs assistance Sitting-balance support: Feet supported  Sitting balance-Leahy Scale: Good Sitting balance - Comments: reaching to his feet from sitting with S   Standing balance support: Bilateral upper extremity supported, Reliant on  assistive device for balance Standing balance-Leahy Scale: Poor Standing balance comment: UE support for balance                             Pertinent Vitals/Pain Pain Assessment Faces Pain Scale: Hurts a little bit Pain Location: ribs on R when coughing Pain Descriptors / Indicators: Grimacing, Guarding Pain Intervention(s): Monitored during session    Home Living Family/patient expects to be discharged to:: Private residence Living Arrangements: Spouse/significant other;Children Available Help at Discharge: Family;Available 24 hours/day Type of Home: House Home Access: Stairs to enter Entrance Stairs-Rails: Psychiatric nurse of Steps: 6 (working to try to get a ramp per daughter)   Home Layout: One level Home Equipment: Conservation officer, nature (2 wheels);Shower seat;Wheelchair - manual Additional Comments: wheelchair is wide to fit through house    Prior Function Prior Level of Function : Needs assist;History of Falls (last six months)             Mobility Comments: has been using RW for mobility, unable to ambulate far due to fatigue and weakness. has to stop to take breaks with stair mgmt, difficulty getting in and out of home. frequent falls (maybe once a week for past year) ADLs Comments: Prior to initial foot fx, able to manage ADls but recently has been requiring assist for LB ADLs, mostly sponge bathing due to difficulty getting in/out of shower     Hand Dominance   Dominant Hand: Right    Extremity/Trunk Assessment   Upper Extremity Assessment Upper Extremity Assessment: Defer to OT evaluation    Lower Extremity Assessment Lower Extremity Assessment: RLE deficits/detail;LLE deficits/detail RLE Deficits / Details: [place] on R foot; AROM generally WFL, strength hip flexion 3+/5 with pain in groin, knee extension 4-/5, ankle NT due to boot RLE Sensation: history of peripheral neuropathy;decreased light touch LLE Deficits / Details: AROM  WFL, strength hip flexion 4-/5 with pain in groin, knee extension 4/5, ankle DF 4-/5 LLE Sensation: decreased light touch;history of peripheral neuropathy    Cervical / Trunk Assessment Cervical / Trunk Assessment: Other exceptions;Kyphotic Cervical / Trunk Exceptions: rounded shoulders, forward head  Communication   Communication: No difficulties  Cognition Arousal/Alertness: Awake/alert Behavior During Therapy: Flat affect Overall Cognitive Status: Impaired/Different from baseline Area of Impairment: Memory, Following commands, Safety/judgement, Awareness, Problem solving                     Memory: Decreased short-term memory Following Commands: Follows one step commands consistently, Follows one step commands with increased time Safety/Judgement: Decreased awareness of deficits Awareness: Emergent Problem Solving: Slow processing, Difficulty sequencing, Decreased initiation, Requires verbal cues General Comments: daughter reports increasing cognitive decline with physical/mobility decline as well        General Comments General comments (skin integrity, edema, etc.): Daughter present and resports a lot of history and though discussed SNF and HH options she deferred to pt who declines staying in a SNF for now.    Exercises     Assessment/Plan    PT Assessment Patient needs continued PT services  PT Problem List Decreased strength;Decreased balance;Decreased cognition;Decreased knowledge of precautions;Obesity;Decreased mobility;Cardiopulmonary status limiting activity;Decreased safety awareness;Decreased activity tolerance       PT Treatment Interventions DME instruction;Functional mobility training;Balance training;Patient/family education;Therapeutic activities;Gait training;Stair training;Therapeutic exercise  PT Goals (Current goals can be found in the Care Plan section)  Acute Rehab PT Goals Patient Stated Goal: to return home and get stronger PT Goal  Formulation: With patient/family Time For Goal Achievement: 07/19/22 Potential to Achieve Goals: Fair    Frequency Min 3X/week     Co-evaluation               AM-PAC PT "6 Clicks" Mobility  Outcome Measure Help needed turning from your back to your side while in a flat bed without using bedrails?: A Little Help needed moving from lying on your back to sitting on the side of a flat bed without using bedrails?: A Little Help needed moving to and from a bed to a chair (including a wheelchair)?: A Little Help needed standing up from a chair using your arms (e.g., wheelchair or bedside chair)?: A Lot Help needed to walk in hospital room?: A Little Help needed climbing 3-5 steps with a railing? : Total 6 Click Score: 15    End of Session Equipment Utilized During Treatment: Gait belt Activity Tolerance: Patient limited by lethargy Patient left: in bed;with family/visitor present   PT Visit Diagnosis: Other abnormalities of gait and mobility (R26.89);Repeated falls (R29.6);Muscle weakness (generalized) (M62.81)    Time: 1031-5945 PT Time Calculation (min) (ACUTE ONLY): 40 min   Charges:   PT Evaluation $PT Eval Moderate Complexity: 1 Mod PT Treatments $Gait Training: 8-22 mins $Therapeutic Activity: 8-22 mins        Magda Kiel, PT Acute Rehabilitation Services Office:[phone] 07/05/2022   Reginia Naas 07/05/2022, 2:21 PM

## 2022-07-05 NOTE — ED Notes (Signed)
Physical therapy at bedside

## 2022-07-05 NOTE — Consult Note (Signed)
[facility] Renal Consultation Note    Indication for Consultation:  Management of ESRD/hemodialysis, anemia, hypertension/volume, and secondary hyperparathyroidism.  HPI: [name] is a 61 y.o. male with PMH including ESRD on dialysis MWF, CHF, CVA, HTN, HLD, and heart transplant who presented to the ED with AMS and falls. Pt was seen in the ED on 10/29 after a fall, head CT was negative and he was discharged. Pt reported he had not fallen since then, but tells me he may have fallen off a short ladder. Per notes, he also reported he took oxycodone after dialysis yesterday but thought he took too much, so he took narcan after. He is confused this AM and not a reliable historian. His family reports he had been acting abnormally for several weeks, sleeping more, confused, and irritable. On admission, mildly tachycardic, BP soft, CXR with new multiple rib fractures, CT head and C spine unremarkable. K+ 4.1, BUN 31, Cr 8.69, Ca 8.9, WBC 13.7, Hgb 9.2. RSV test positive. Blood cultures pending. Pt reports he has not had dialysis in over a week but review of HD records show he did complete 3 hr 15 min of HD yesterday and has shortened some treatments but not missed any. He has asterixis on exam. Lyrica on med list but he cannot tell me if he has been taking it. Says she is taking a pain medicine that is '10mg'$  and he takes "sometimes 1, sometimes 4 pills."   Past Medical History:  Diagnosis Date   Arthritis    Atrial fibrillation (HCC)    CHF (congestive heart failure), NYHA class III (HCC)    s/p heart transplant   Chronic kidney disease    end stage   Chronic systolic dysfunction of left ventricle    CVA (cerebral infarction)    Diabetes mellitus    PMH; Prior to heart transplant   Family history of coronary artery disease    in both parents   GERD (gastroesophageal reflux disease)    Hyperlipidemia    Hypertension    Left bundle branch block    Morbid obesity (HCC)    status  post lap band   Myocardial infarction [facility])    prior to heart transplant   Nonischemic cardiomyopathy ([place])    prior to heart transplant   Obesity (BMI 30-39.9)    Obstructive sleep apnea    no longer needs CPAP after heart transplant per pt   Premature ventricular contractions    Prostate cancer (HCC)    SOB (shortness of breath)    Past Surgical History:  Procedure Laterality Date   CARDIAC PACEMAKER PLACEMENT  09/21/2009   Biventricular implantable cardioverter-defibrillator implantation      COLONOSCOPY W/ BIOPSIES AND POLYPECTOMY     CYSTOSCOPY WITH FULGERATION N/A 04/27/2021   Procedure: CYSTOSCOPY AND CLOT EVACUATION WITH BLADDER BIOPSY/ FUGARATION OF BLADDER/ FULGARATION OF PROSTATE ;  Surgeon: [name], MD;  Location: [facility];  Service: Urology;  Laterality: N/A;   FOOT SURGERY     left   HEART TRANSPLANT  2014   LAPAROSCOPIC GASTRIC BANDING  01/27/2007   LEFT VENTRICULAR ASSIST DEVICE     implanted at [place] Right 07/22/2017   Procedure: RIGHT TOTAL HIP ARTHROPLASTY ANTERIOR APPROACH;  Surgeon: Rod Can, MD;  Location: [facility];  Service: Orthopedics;  Laterality: Right;  Needs RNFA   TOTAL KNEE ARTHROPLASTY Right 07/22/2017   Family  History  Problem Relation Age of Onset   Coronary artery disease Father        had, PTCA & CABG   Heart attack Father    Hypertension Father    Diabetes Father    Prostate cancer Father    Coronary artery disease Mother    Heart attack Mother    Hypertension Mother    Stroke Mother    Lupus Mother    Prostate cancer Brother    Coronary artery disease Brother    Coronary artery disease Sister    Heart attack Sister    Prostate cancer Paternal Uncle    Coronary artery disease Maternal Grandmother    Coronary artery disease Maternal Grandfather    Coronary artery disease Paternal Grandmother    Coronary artery disease Paternal Grandfather    Social  History:  reports that he has never smoked. He has quit using smokeless tobacco.  His smokeless tobacco use included snuff. He reports that he does not drink alcohol and does not use drugs.  ROS: As per HPI otherwise negative.  Review of Systems: Gen: Denies any fever, chills, fatigue, weakness, malaise, weight loss, and/or sleep disorders. HEENT: Denies blurred vision, sore throat or epistaxis. No sinusitis.   CV: Denies chest pain, angina, palpitations, orthopnea, PND, peripheral edema, and claudication. Resp: Denies dyspnea at rest, dyspnea with exercise, cough, sputum, or wheezing. GI: Denies nausea, vomiting, abdominal pain, constipation or diarrhea. GU: Denies dysuria or hematuria. MS: Denies joint pain, muscle pain or cramps.  Derm: Denies rashes, itching, or ulcerations. Psych: Denies depression and anxiety. Heme: Denies bruising, bleeding, and enlarged lymph nodes. Neuro: No headache, dizziness, or weakness. Endocrine: No hypoglycemia or thyroid disease.  Physical Exam: Vitals:   07/05/22 0500 07/05/22 0530 07/05/22 0557 07/05/22 0631  BP: (!) 94/59 (!) 87/59  (!) 92/59  Pulse: 97 95  95  Resp:    17  Temp:   97.8 F (36.6 C)   TempSrc:   Oral   SpO2: 92% 93%  91%  Weight:      Height:         General: Well developed, alert male in NAD Head: Normocephalic, atraumatic, sclera non-icteric, mucus membranes are moist. Neck: JVD not elevated. Lungs: Clear bilaterally to auscultation without wheezes, rales, or rhonchi. Breathing is unlabored. Poor inspiratory effort Heart: RRR with normal S1, S2. No murmurs, rubs, or gallops appreciated. Abdomen: Soft, non-tender, non-distended with normoactive bowel sounds. No rebound/guarding. No obvious abdominal masses. Musculoskeletal:  Strength and tone appear normal for age. Lower extremities: No edema b/l lower extremities Neuro: Alert and oriented X 2 (states name and knows he is at the hospital). Moves all extremities  spontaneously. + asterixis Psych:  Responds to questions appropriately with a normal affect. Dialysis Access: RUE AVF + bruit  Allergies  Allergen Reactions   Heparin Nausea Only, Swelling and Other (See Comments)    * * HIT * *  SWELLING REACTION UNSPECIFIED   DIAPHORESIS    Iodinated Contrast Media    Penicillin G Potassium [Penicillin G] Nausea Only and Other (See Comments)    DIAPHORESIS   Prior to Admission medications   Medication Sig Start Date End Date Taking? Authorizing Provider  citalopram (CELEXA) 20 MG tablet Take 20 mg by mouth daily. 04/06/20  Yes [provider]  cycloSPORINE modified (NEORAL) 100 MG capsule Take 100 mg by mouth See admin instructions. Take with three 25 mg  (75 ) for a total of 175 mg twice a day  Yes [provider]  cycloSPORINE modified (NEORAL) 25 MG capsule Take 75 mg by mouth See admin instructions. Take With 100 mg tab for a total of 175 mg twice a day   Yes [provider]  ELIQUIS 2.5 MG TABS tablet Take 1 tablet (2.5 mg total) by mouth 2 (two) times daily. 04/30/21  Yes [name], MD  lidocaine (LIDODERM) 5 % Place 1 patch onto the skin daily. Remove & Discard patch within 12 hours or as directed by MD 06/24/22  Yes Godfrey Pick, MD  lidocaine (XYLOCAINE) 5 % ointment Apply 1 application topically as needed. 04/14/19  Yes Jamse Arn, MD  methocarbamol (ROBAXIN) 500 MG tablet Take 1 tablet (500 mg total) by mouth every 8 (eight) hours as needed for muscle spasms. 06/24/22  Yes Godfrey Pick, MD  midodrine (PROAMATINE) 10 MG tablet Take 1 tablet (10 mg total) by mouth 3 (three) times daily. 04/27/21  Yes [name], MD  mycophenolate (MYFORTIC) 180 MG EC tablet Take 720 mg by mouth 2 (two) times daily.   Yes [provider]  ondansetron (ZOFRAN) 4 MG tablet Take 1 tablet (4 mg total) by mouth every 6 (six) hours as needed for nausea. 07/23/17  Yes Swinteck, Aaron Edelman, MD  OXcarbazepine (TRILEPTAL) 150  MG tablet Take 1 tablet (150 mg total) by mouth 2 (two) times daily. 10/12/21  Yes Marcial Pacas, MD  oxybutynin (DITROPAN) 5 MG tablet Take 1 tablet (5 mg total) by mouth every 8 (eight) hours as needed for bladder spasms. 04/23/21  Yes Davonna Belling, MD  pregabalin (LYRICA) 300 MG capsule Take 300 mg by mouth 2 (two) times daily.   Yes [provider]  sevelamer carbonate (RENVELA) 800 MG tablet Take 2 tablets (1,600 mg total) by mouth 3 (three) times daily with meals. 11/11/18  Yes Kayleen Memos, DO  cinacalcet (SENSIPAR) 60 MG tablet Take 60 mg by mouth daily. Patient not taking: Reported on 07/04/2022 04/19/21   [provider]  lidocaine-prilocaine (EMLA) cream Apply 1 application topically as needed. Patient not taking: Reported on 07/04/2022    [provider]  pregabalin (LYRICA) 25 MG capsule TAKE 5 CAPSULES BY MOUTH AFTER DIALYSIS Patient not taking: Reported on 07/04/2022 06/09/20   Jamse Arn, MD  pregabalin (LYRICA) 75 MG capsule TAKE 1 CAPSULE(75 MG) BY MOUTH DAILY Patient not taking: Reported on 07/04/2022 07/12/20   Jamse Arn, MD   Current Facility-Administered Medications  Medication Dose Route Frequency Provider Last Rate Last Admin   acetaminophen (TYLENOL) tablet 650 mg  650 mg Oral Q6H Meuth, Brooke A, PA-C       Or   acetaminophen (TYLENOL) suppository 650 mg  650 mg Rectal Q6H Meuth, Brooke A, PA-C       citalopram (CELEXA) tablet 20 mg  20 mg Oral Daily Marcelyn Bruins, MD       cycloSPORINE (SANDIMMUNE) capsule 175 mg  175 mg Oral BID Marcelyn Bruins, MD   175 mg at 07/04/22 2306   HYDROmorphone (DILAUDID) injection 0.5 mg  0.5 mg Intravenous Q4H PRN Meuth, Brooke A, PA-C       insulin aspart (novoLOG) injection 0-6 Units  0-6 Units Subcutaneous TID WC Marcelyn Bruins, MD       lidocaine (LIDODERM) 5 % 1 patch  1 patch Transdermal Q24H Meuth, Brooke A, PA-C       midodrine (PROAMATINE) tablet 10 mg  10 mg Oral TID  Marcelyn Bruins, MD  10 mg at 07/04/22 2306   mycophenolate (MYFORTIC) EC tablet 720 mg  720 mg Oral BID Marcelyn Bruins, MD   720 mg at 07/04/22 2306   oxybutynin (DITROPAN) tablet 5 mg  5 mg Oral Q8H PRN Marcelyn Bruins, MD       oxyCODONE (Oxy IR/ROXICODONE) immediate release tablet 5-10 mg  5-10 mg Oral Q4H PRN Meuth, Brooke A, PA-C       polyethylene glycol (MIRALAX / GLYCOLAX) packet 17 g  17 g Oral Daily PRN Marcelyn Bruins, MD       rosuvastatin (CRESTOR) tablet 10 mg  10 mg Oral Daily Marcelyn Bruins, MD       sevelamer carbonate (RENVELA) tablet 1,600 mg  1,600 mg Oral TID WC Marcelyn Bruins, MD       sodium chloride flush (NS) 0.9 % injection 3 mL  3 mL Intravenous Q12H Marcelyn Bruins, MD   3 mL at 07/04/22 2218   Current Outpatient Medications  Medication Sig Dispense Refill   citalopram (CELEXA) 20 MG tablet Take 20 mg by mouth daily.     cycloSPORINE modified (NEORAL) 100 MG capsule Take 100 mg by mouth See admin instructions. Take with three 25 mg  (75 ) for a total of 175 mg twice a day     cycloSPORINE modified (NEORAL) 25 MG capsule Take 75 mg by mouth See admin instructions. Take With 100 mg tab for a total of 175 mg twice a day     ELIQUIS 2.5 MG TABS tablet Take 1 tablet (2.5 mg total) by mouth 2 (two) times daily. 60 tablet    lidocaine (LIDODERM) 5 % Place 1 patch onto the skin daily. Remove & Discard patch within 12 hours or as directed by MD 12 patch 0   lidocaine (XYLOCAINE) 5 % ointment Apply 1 application topically as needed. 35.44 g 0   methocarbamol (ROBAXIN) 500 MG tablet Take 1 tablet (500 mg total) by mouth every 8 (eight) hours as needed for muscle spasms. 25 tablet 0   midodrine (PROAMATINE) 10 MG tablet Take 1 tablet (10 mg total) by mouth 3 (three) times daily.     mycophenolate (MYFORTIC) 180 MG EC tablet Take 720 mg by mouth 2 (two) times daily.     ondansetron (ZOFRAN) 4 MG tablet Take 1 tablet (4 mg total) by mouth every 6  (six) hours as needed for nausea. 20 tablet 0   OXcarbazepine (TRILEPTAL) 150 MG tablet Take 1 tablet (150 mg total) by mouth 2 (two) times daily. 60 tablet 11   oxybutynin (DITROPAN) 5 MG tablet Take 1 tablet (5 mg total) by mouth every 8 (eight) hours as needed for bladder spasms. 10 tablet 0   pregabalin (LYRICA) 300 MG capsule Take 300 mg by mouth 2 (two) times daily.     sevelamer carbonate (RENVELA) 800 MG tablet Take 2 tablets (1,600 mg total) by mouth 3 (three) times daily with meals. 120 tablet 0   cinacalcet (SENSIPAR) 60 MG tablet Take 60 mg by mouth daily. (Patient not taking: Reported on 07/04/2022)     lidocaine-prilocaine (EMLA) cream Apply 1 application topically as needed. (Patient not taking: Reported on 07/04/2022)     pregabalin (LYRICA) 25 MG capsule TAKE 5 CAPSULES BY MOUTH AFTER DIALYSIS (Patient not taking: Reported on 07/04/2022) 60 capsule 1   pregabalin (LYRICA) 75 MG capsule TAKE 1 CAPSULE(75 MG) BY MOUTH DAILY (Patient not taking: Reported on 07/04/2022) 30 capsule 0   Labs:  Basic Metabolic Panel: Recent Labs  Lab 07/04/22 1529 07/05/22 0433  NA 140 138  K 3.8 4.1  CL 92* 94*  CO2 29 28  GLUCOSE 113* 94  BUN 23 31*  CREATININE 7.10* 8.69*  CALCIUM 9.2 8.6*   Liver Function Tests: Recent Labs  Lab 07/04/22 1529 07/05/22 0433  AST 18 16  ALT 21 18  ALKPHOS 87 78  BILITOT 1.9* 1.8*  PROT 7.7 6.5  ALBUMIN 3.1* 2.7*   No results for input(s): "LIPASE", "AMYLASE" in the last 168 hours. No results for input(s): "AMMONIA" in the last 168 hours. CBC: Recent Labs  Lab 07/04/22 1529 07/05/22 0433  WBC 9.6 13.7*  NEUTROABS 7.5  --   HGB 9.7* 9.2*  HCT 30.5* 28.0*  MCV 92.1 90.6  PLT 196 185   Cardiac Enzymes: No results for input(s): "CKTOTAL", "CKMB", "CKMBINDEX", "TROPONINI" in the last 168 hours. CBG: Recent Labs  Lab 07/04/22 1523 07/04/22 2250 07/05/22 0809  GLUCAP 114* 98 93   Iron Studies: No results for input(s): "IRON", "TIBC",  "TRANSFERRIN", "FERRITIN" in the last 72 hours. Studies/Results: DG CHEST PORT 1 VIEW  Result Date: 07/05/2022 CLINICAL DATA:  Right rib fractures. EXAM: PORTABLE CHEST 1 VIEW COMPARISON:  Same day. FINDINGS: Stable cardiomegaly. Sternotomy wires are noted. Mild bibasilar subsegmental atelectasis is noted. Multiple right rib fractures are again noted. No definite pneumothorax is noted. IMPRESSION: Mild bibasilar subsegmental atelectasis. Multiple right rib fractures are again noted. Electronically Signed   By: Marijo Conception M.D.   On: 07/05/2022 08:21   DG Foot 2 Views Left  Result Date: 07/04/2022 CLINICAL DATA:  Fall EXAM: LEFT FOOT - 2 VIEW COMPARISON:  12/23/2017 FINDINGS: Vascular calcifications. Removal of previously noted surgical pin from the second digit. Fixating screw across the fourth PIP joint with osseous fusion. Defect base of the second distal phalanx with smooth sclerotic margin suggesting chronic deformity. There appears to be resection of the second middle phalanx and the distal aspect of the second proximal phalanx. IMPRESSION: 1. No definitive acute osseous abnormality. 2. Postsurgical changes of the second and fourth digits. Chronic appearing deformity at the second distal removal of previously noted fixating pin in the second digit with apparent interval resection of the distal aspect second proximal phalanx as well as the middle phalanx of second digit. Chronic appearing defect base of the second distal phalanx. Electronically Signed   By: Donavan Foil M.D.   On: 07/04/2022 20:31   DG Foot 2 Views Right  Result Date: 07/04/2022 CLINICAL DATA:  Fall EXAM: RIGHT FOOT - 2 VIEW COMPARISON:  None Available. FINDINGS: Bones appear demineralized. Mild degenerative change at the first MTP joint. Malalignment at the second TMT joint with lateral subluxation of the base of the metatarsal. Heterogeneous sclerosis at the base of the second and probably third metatarsals but slightly  limited assessment due to positioning and osseous superimposition. Vascular calcifications. Possible old deformity at the distal fibula on lateral view. Dorsal degenerative change at the TMT joints. IMPRESSION: 1. Appearance of malalignment at the second and probably third TMT joints as may be seen with Lisfranc injury. Apparent heterogeneous sclerosis at the base of the second and probably third metatarsal bases suggesting chronic fracture Electronically Signed   By: Donavan Foil M.D.   On: 07/04/2022 20:28   CT Head Wo Contrast  Result Date: 07/04/2022 CLINICAL DATA:  Interval trauma with new right rib fractures. Possible falls. Altered mental status. EXAM: CT HEAD WITHOUT CONTRAST CT CERVICAL SPINE  WITHOUT CONTRAST TECHNIQUE: Multidetector CT imaging of the head and cervical spine was performed following the standard protocol without intravenous contrast. Multiplanar CT image reconstructions of the cervical spine were also generated. RADIATION DOSE REDUCTION: This exam was performed according to the departmental dose-optimization program which includes automated exposure control, adjustment of the mA and/or kV according to patient size and/or use of iterative reconstruction technique. COMPARISON:  06/24/2022 FINDINGS: CT HEAD FINDINGS Brain: Stable appearance of encephalomalacia from remote right frontal lobe infarct, no change or progression. Periventricular white matter and corona radiata hypodensities favor chronic ischemic microvascular white matter disease. Otherwise, the brainstem, cerebellum, cerebral peduncles, thalamus, basal ganglia, basilar cisterns, and ventricular system appear within normal limits. No intracranial hemorrhage, mass lesion, or acute CVA. Vascular: There is atherosclerotic calcification of the cavernous carotid arteries bilaterally. Skull: Unremarkable Sinuses/Orbits: Unremarkable Other: No supplemental non-categorized findings. CT CERVICAL SPINE FINDINGS Alignment: 3 mm chronic  degenerative anterolisthesis at C2-3. Loss of the normal cervical lordosis, which can be associated with muscle spasm. Skull base and vertebrae: Fused left facet joint at C3-4 along with some probable mild interbody fusion this level. Reduced signal to noise ratio in the lower cervical spine due to body habitus. Stable spurring and possible small erosions versus degenerative subcortical cysts at the anterior C1-2 articulation. Paragraphs there is loss of intervertebral disc height along with mild endplate irregularity at all levels between C2 and C7, as before, compatible with degenerative disc disease and degenerative endplate findings. No cervical spine fracture or acute bony findings identified. Soft tissues and spinal canal: Unremarkable Disc levels: Bilateral osseous foraminal stenosis at C3-4 due to uncinate spurring. There is likely mild left foraminal stenosis at C6-7 due to uncinate spurring. Upper chest: Lung apices appear clear. Other: Lucent lesion in the left first rib with trabecular coarsening as shown on image 64 series 4. This had mixed but mostly high T2 signal on the MRI from 11/27/2021 and could well be a hemangioma but is not entirely technically specific. This lesion is fairly indistinct on radiography condom, manifesting only is faint speckled density on chest radiographs, and is difficult to corroborate on older chest radiograph studies. A lytic lesion of bone such as a metastatic lesion is considered less likely but not entirely excluded given the limited characterization of this lesion. There is no adjacent soft tissue mass. IMPRESSION: 1. No acute intracranial findings or acute cervical spine findings. 2. Stable encephalomalacia from remote right frontal lobe infarct. 3. Periventricular white matter and corona radiata hypodensities favor chronic ischemic microvascular white matter disease. 4. Lucent lesion in the left first rib with trabecular coarsening. This has mixed but mostly high T2  signal on the MRI from 11/27/2021 and could well be a hemangioma but is not entirely technically specific. No adjacent soft tissue mass. This might be further characterized with whole-body bone scan or MRI of the left upper chest with attention to the left first rib. 5. Atherosclerosis. 6. Cervical spondylosis and degenerative disc disease causing bilateral osseous foraminal stenosis at C3-4 and mild left foraminal stenosis at C6-7. Electronically Signed   By: Van Clines M.D.   On: 07/04/2022 18:05   CT Cervical Spine Wo Contrast  Result Date: 07/04/2022 CLINICAL DATA:  Interval trauma with new right rib fractures. Possible falls. Altered mental status. EXAM: CT HEAD WITHOUT CONTRAST CT CERVICAL SPINE WITHOUT CONTRAST TECHNIQUE: Multidetector CT imaging of the head and cervical spine was performed following the standard protocol without intravenous contrast. Multiplanar CT image reconstructions of the cervical  spine were also generated. RADIATION DOSE REDUCTION: This exam was performed according to the departmental dose-optimization program which includes automated exposure control, adjustment of the mA and/or kV according to patient size and/or use of iterative reconstruction technique. COMPARISON:  06/24/2022 FINDINGS: CT HEAD FINDINGS Brain: Stable appearance of encephalomalacia from remote right frontal lobe infarct, no change or progression. Periventricular white matter and corona radiata hypodensities favor chronic ischemic microvascular white matter disease. Otherwise, the brainstem, cerebellum, cerebral peduncles, thalamus, basal ganglia, basilar cisterns, and ventricular system appear within normal limits. No intracranial hemorrhage, mass lesion, or acute CVA. Vascular: There is atherosclerotic calcification of the cavernous carotid arteries bilaterally. Skull: Unremarkable Sinuses/Orbits: Unremarkable Other: No supplemental non-categorized findings. CT CERVICAL SPINE FINDINGS Alignment: 3 mm  chronic degenerative anterolisthesis at C2-3. Loss of the normal cervical lordosis, which can be associated with muscle spasm. Skull base and vertebrae: Fused left facet joint at C3-4 along with some probable mild interbody fusion this level. Reduced signal to noise ratio in the lower cervical spine due to body habitus. Stable spurring and possible small erosions versus degenerative subcortical cysts at the anterior C1-2 articulation. Paragraphs there is loss of intervertebral disc height along with mild endplate irregularity at all levels between C2 and C7, as before, compatible with degenerative disc disease and degenerative endplate findings. No cervical spine fracture or acute bony findings identified. Soft tissues and spinal canal: Unremarkable Disc levels: Bilateral osseous foraminal stenosis at C3-4 due to uncinate spurring. There is likely mild left foraminal stenosis at C6-7 due to uncinate spurring. Upper chest: Lung apices appear clear. Other: Lucent lesion in the left first rib with trabecular coarsening as shown on image 64 series 4. This had mixed but mostly high T2 signal on the MRI from 11/27/2021 and could well be a hemangioma but is not entirely technically specific. This lesion is fairly indistinct on radiography condom, manifesting only is faint speckled density on chest radiographs, and is difficult to corroborate on older chest radiograph studies. A lytic lesion of bone such as a metastatic lesion is considered less likely but not entirely excluded given the limited characterization of this lesion. There is no adjacent soft tissue mass. IMPRESSION: 1. No acute intracranial findings or acute cervical spine findings. 2. Stable encephalomalacia from remote right frontal lobe infarct. 3. Periventricular white matter and corona radiata hypodensities favor chronic ischemic microvascular white matter disease. 4. Lucent lesion in the left first rib with trabecular coarsening. This has mixed but mostly  high T2 signal on the MRI from 11/27/2021 and could well be a hemangioma but is not entirely technically specific. No adjacent soft tissue mass. This might be further characterized with whole-body bone scan or MRI of the left upper chest with attention to the left first rib. 5. Atherosclerosis. 6. Cervical spondylosis and degenerative disc disease causing bilateral osseous foraminal stenosis at C3-4 and mild left foraminal stenosis at C6-7. Electronically Signed   By: Van Clines M.D.   On: 07/04/2022 18:05   CT CHEST ABDOMEN PELVIS WO CONTRAST  Result Date: 07/04/2022 CLINICAL DATA:  Altered behavior, possible medication overdose. Anti coagulation. Fall on 06/24/2022. Dialysis this morning. New rib fractures on chest radiography today. EXAM: CT CHEST, ABDOMEN AND PELVIS WITHOUT CONTRAST TECHNIQUE: Multidetector CT imaging of the chest, abdomen and pelvis was performed following the standard protocol without IV contrast. RADIATION DOSE REDUCTION: This exam was performed according to the departmental dose-optimization program which includes automated exposure control, adjustment of the mA and/or kV according to patient size  and/or use of iterative reconstruction technique. COMPARISON:  Multiple exams, including 06/24/2022 and 04/23/2021 FINDINGS: CT CHEST FINDINGS Cardiovascular: Atherosclerotic calcification of the thoracic aorta and coronary arteries along with cardiomegaly. Substantially enlarged but stable main pulmonary artery and right atrium. There are some pericardial and epicardial calcifications along with some venous calcifications probably related to prior indwelling catheters. Mediastinum/Nodes: No pathologic adenopathy. Lungs/Pleura: Mildly improved atelectasis in the right lower lobe compared to the 06/24/2022 exam. Peribronchovascular reticulonodular and tree-in-bud opacities in the left upper lobe for example on image 54 series 4, likely from atypical infectious process. Continued  substantial volume loss and some consolidation in the left lower lobe, roughly similar to prior, encompassing most of the left lower lobe. Musculoskeletal: There are a variety of newly visible right rib fractures compared to the prior exam. These include fractures of the right third, fourth, fifth, sixth, and seventh ribs. The fourth, fifth, sixth, and seventh rib fractures are segmental, with the fourth, fifth, and sixth rib fractures being multi segmental (fractures in 3 locations in each rib). However, for the most part the rib fractures are nondisplaced or only minimally displaced. No thoracic spine fracture is identified. CT ABDOMEN PELVIS FINDINGS Hepatobiliary: Unremarkable.  Contracted gallbladder. Pancreas: Unremarkable Spleen: Unremarkable Adrenals/Urinary Tract: Innumerable cysts of varying complexity throughout both kidneys compatible with autosomal dominant polycystic kidney disease, similar distribution to prior. Adrenal glands unremarkable. Urinary bladder empty. Small punctate calcification along the renal collecting systems likely mostly vascular although a component of nonobstructive renal calculi are difficult to exclude particularly on the left side. Stomach/Bowel: Prior sleeve gastrectomy. Prominent stool throughout the colon favors constipation. No dilated small bowel. Normal appendix. Vascular/Lymphatic: Atherosclerosis is present, including aortoiliac atherosclerotic disease. No pathologic adenopathy. Reproductive: Fiducials along the prostate gland. Other: No supplemental non-categorized findings. Musculoskeletal: Right total hip prosthesis. Lower lumbar degenerative facet arthropathy probably with facet joint effusions at L4-5 bilaterally. Umbilical hernia contains adipose tissue. Moderate degenerative arthropathy of the left hip. IMPRESSION: 1. There are a variety of newly visible right rib fractures compared to the prior exam. These include fractures of the right third, fourth, fifth,  sixth, and seventh ribs. The fourth, fifth, and sixth rib fractures are segmental, with the fourth, fifth, and sixth rib fractures being multi segmental. However, for the most part the rib fractures are nondisplaced or only minimally displaced. No pneumothorax or substantial pleural effusion. 2. Continued substantial volume loss and some consolidation in the left lower lobe, roughly similar to prior. Much of this is chronic back through 2016. 3. Peribronchovascular reticulonodular and tree-in-bud opacities in the left upper lobe, likely from atypical infectious process. 4. Cardiomegaly with considerable enlargement of the main pulmonary artery and right atrium, suggesting pulmonary arterial hypertension. 5. Prominent stool throughout the colon favors constipation. 6. Autosomal dominant polycystic kidney disease. Cannot exclude punctate nonobstructive left nephrolithiasis. 7. Umbilical hernia contains adipose tissue. 8. Moderate degenerative arthropathy of the left hip. 9. Lower lumbar degenerative facet arthropathy probably with facet joint effusions at L4-5 bilaterally. 10. Aortic atherosclerosis. Aortic Atherosclerosis (ICD10-I70.0). Electronically Signed   By: Van Clines M.D.   On: 07/04/2022 17:46   DG Chest Portable 1 View  Result Date: 07/04/2022 CLINICAL DATA:  Altered mental status.  Right-sided chest pain. EXAM: PORTABLE CHEST 1 VIEW COMPARISON:  11/09/2018 radiography.  Chest CT 06/24/2022 FINDINGS: Previous median sternotomy. Cardiomegaly. Tortuous aorta. The patient has taken a poor inspiration. Allowing for that, there may be mild volume loss at both lung bases. Upper lobes appear clear. No visible  effusion. There are multiple rib fractures on the right which are new since the chest CT of 10 days ago, including ribs 2 through 7. No pneumothorax. IMPRESSION: 1. Multiple rib fractures on the right which are new since the chest CT of 10 days ago. No pneumothorax. 2. Poor inspiration. Allowing  for that, there may be mild volume loss at both lung bases. Electronically Signed   By: Nelson Chimes M.D.   On: 07/04/2022 15:49    Dialysis Orders: Center: [facility]  on MWF. Optiflux 200NRe 4 hours, BFR 500 DFR Auto 2 EDW 139.2kg 2K 2 Ca AVF buttonhole 14g no heparin Mircera 59mg IV q 4 weeks- last dose 06/22/22 Hectorol 561m IV q HD  Assessment/Plan:  AMS/falls: Multiple rib fractures, tells me he thinks he fell off a ladder. positive for RSV, blood cultures pending. There may also be a component of polypharmacy based on history. Would d/c lyrica and hold other sedating meds. Further work up per primary team.   ESRD:  on MWF schedule, last HD yesterday. No emergent indications for HD today, will plan for next HD tomorrow per his regular schedule.   Hypertension/volume: BP soft, on midodrine. No volume overload on CXR. UF to EDW tomorrow as tolerated.   Anemia: Hgb 9.2. ESA dose recently increased, not due for next dose yet.   Metabolic bone disease: Calcium controlled, continue VDRA and binders.   Nutrition:  Renal diet, 1.2L fluid restrictions Neuropathy: No complaint of pain today. D/c lyrica, if neuropathy worsens in the future low dose gabapentin may be a better option  SaAnice PaganiniPA-C 07/05/2022, 8:30 AM  [facility] Pager: [phone]

## 2022-07-05 NOTE — Evaluation (Signed)
Occupational Therapy Evaluation Patient Details Name: [name] MRN: [id number] DOB: 08/12/1961 Today's Date: 07/05/2022   History of Present Illness Pt is a 61 y/o male who presented with AMS and falls at home. CT showed R 3-7 rib fx. RSV+. PMH: ESRD on HD MWF, CHF s/p heart transplant, DM2, obesity, peripheral neuropathy, sleep apnea, HTN, a fib, malignant neoplasm of prostate.   Clinical Impression   PTA, pt lives at home with family, typically ambulatory with a RW and able to complete ADLs. However, since foot fx in Sept 2023, pt with progressive decline and requiring increased assistance for daily tasks. Pt also with frequent falls. Pt presents now min guard to supervision for limited mobility in room with RW, Min A for UB ADL and Max A for LB ADLs. Pt limited by fatigue throughout, DOE noted post activity though SpO2 >92% on RA. Discussed SNF vs HH recs with pt/wife though not clear which route family would like to pursue. Pt declining SNF and would like to return home. Will continue to follow acutely to progress overall endurance and safety with ADLs/mobility to facilitate safe DC.     Recommendations for follow up therapy are one component of a multi-disciplinary discharge planning process, led by the attending physician.  Recommendations may be updated based on patient status, additional functional criteria and insurance authorization.   Follow Up Recommendations  Skilled nursing-short term rehab (<3 hours/day) (vs HHOT if family comfortable continuing to provide assistance at home)    Assistance Recommended at Discharge Frequent or constant Supervision/Assistance  Patient can return home with the following A little help with walking and/or transfers;A lot of help with bathing/dressing/bathroom;Assistance with cooking/housework;Assist for transportation;Help with stairs or ramp for entrance    Functional Status Assessment  Patient has had a recent decline in their functional status  and demonstrates the ability to make significant improvements in function in a reasonable and predictable amount of time.  Equipment Recommendations  BSC/3in1 (bariatric)    Recommendations for Other Services       Precautions / Restrictions Precautions Precautions: Fall;Other (comment) Precaution Comments: monitor O2 Required Braces or Orthoses: Other Brace Other Brace: R cam boot Restrictions Weight Bearing Restrictions: Yes RLE Weight Bearing: Weight bearing as tolerated Other Position/Activity Restrictions: per MD, WBAT w/ cam boot      Mobility Bed Mobility               General bed mobility comments: sitting EOB on entry    Transfers Overall transfer level: Needs assistance Equipment used: Rolling walker (2 wheels) Transfers: Sit to/from Stand Sit to Stand: Min guard                  Balance Overall balance assessment: Needs assistance Sitting-balance support: No upper extremity supported, Feet supported Sitting balance-Leahy Scale: Good     Standing balance support: Bilateral upper extremity supported, During functional activity, No upper extremity supported Standing balance-Leahy Scale: Poor                             ADL either performed or assessed with clinical judgement   ADL Overall ADL's : Needs assistance/impaired Eating/Feeding: Independent   Grooming: Set up;Sitting   Upper Body Bathing: Minimal assistance;Sitting   Lower Body Bathing: Maximal assistance;Sit to/from stand   Upper Body Dressing : Set up;Sitting   Lower Body Dressing: Maximal assistance;Sit to/from stand Lower Body Dressing Details (indicate cue type and reason): assist for cam  boot, sock mgmt (wife assisting), requesting assist to manage pants in standing though pt required encouragement to attempt on own. able to reach with one UE on RW but fatigued quickly with standing Toilet Transfer: Supervision/safety;Stand-pivot;Rolling walker (2 wheels)    Toileting- Clothing Manipulation and Hygiene: Moderate assistance;Sitting/lateral lean;Sit to/from stand       Functional mobility during ADLs: Supervision/safety;Rolling walker (2 wheels) General ADL Comments: quick fatigue with daily tasks requiring extensive assist though able to stand and mobilize a very short distance using RW without assistance. Discussed with pt/wife on helpful DME, energy conservation and HH vs SNF     Vision Ability to See in Adequate Light: 0 Adequate Patient Visual Report: No change from baseline Vision Assessment?: No apparent visual deficits     Perception     Praxis      Pertinent Vitals/Pain Pain Assessment Pain Assessment: Faces Faces Pain Scale: Hurts a little bit Pain Location: ribs when coughing Pain Descriptors / Indicators: Grimacing, Guarding Pain Intervention(s): Monitored during session, Other (comment) (educated on use of pillow for splinting when coughing)     Hand Dominance Right   Extremity/Trunk Assessment Upper Extremity Assessment Upper Extremity Assessment: Overall WFL for tasks assessed   Lower Extremity Assessment Lower Extremity Assessment: Defer to PT evaluation   Cervical / Trunk Assessment Cervical / Trunk Assessment: Other exceptions Cervical / Trunk Exceptions: body habitus   Communication Communication Communication: No difficulties   Cognition Arousal/Alertness: Awake/alert Behavior During Therapy: Flat affect Overall Cognitive Status: Impaired/Different from baseline Area of Impairment: Memory, Following commands, Safety/judgement, Awareness, Problem solving                     Memory: Decreased short-term memory Following Commands: Follows one step commands consistently, Follows one step commands with increased time Safety/Judgement: Decreased awareness of deficits Awareness: Emergent Problem Solving: Slow processing, Difficulty sequencing, Decreased initiation, Requires verbal cues General  Comments: pt reports feeling a bit "foggy" in his mind and some memory deficits in recalling recent events. flat affect, self limiting at times and does require encouragement to attempt tasks on his own     General Comments  Wife and daughter presented. attempted to ellicit family comfort level with continuing to assist at home, explained [employer] vs SNF options due to difficulty getting out of home even on HD days    Exercises     Shoulder Instructions      Home Living Family/patient expects to be discharged to:: Private residence Living Arrangements: Spouse/significant other;Children Available Help at Discharge: Family;Available 24 hours/day Type of Home: House Home Access: Stairs to enter [employer] of Steps: 6 Entrance Stairs-Rails: Right;Left Home Layout: One level     Bathroom Shower/Tub: Occupational psychologist: Standard     Home Equipment: Conservation officer, nature (2 wheels);Shower seat          Prior Functioning/Environment Prior Level of Function : Needs assist;History of Falls (last six months)             Mobility Comments: has been using RW for mobility, unable to ambulate far due to fatigue and weakness. has to stop to take breaks with stair mgmt, difficulty getting in and out of home. frequent falls ADLs Comments: Prior to initial foot fx, able to manage ADls but recently has been requiring assist for LB ADLs, mostly sponge bathing due to difficulty getting in/out of shower        OT Problem List: Decreased strength;Decreased activity tolerance;Impaired balance (sitting and/or standing);Decreased  safety awareness;Decreased cognition;Decreased knowledge of use of DME or AE;Cardiopulmonary status limiting activity;Obesity      OT Treatment/Interventions: Self-care/ADL training;Therapeutic exercise;Energy conservation;DME and/or AE instruction;Therapeutic activities;Patient/family education    OT Goals(Current goals can be found in the care plan  section) Acute Rehab OT Goals Patient Stated Goal: get better, increase strength/endurance OT Goal Formulation: With patient/family Time For Goal Achievement: 07/19/22 Potential to Achieve Goals: Good  OT Frequency: Min 2X/week    Co-evaluation              AM-PAC OT "6 Clicks" Daily Activity     Outcome Measure Help from another person eating meals?: None Help from another person taking care of personal grooming?: A Little Help from another person toileting, which includes using toliet, bedpan, or urinal?: A Lot Help from another person bathing (including washing, rinsing, drying)?: A Lot Help from another person to put on and taking off regular upper body clothing?: A Little Help from another person to put on and taking off regular lower body clothing?: A Lot 6 Click Score: 16   End of Session Equipment Utilized During Treatment: Rolling walker (2 wheels)  Activity Tolerance: Patient limited by fatigue Patient left: in bed;with call bell/phone within reach;with family/visitor present  OT Visit Diagnosis: Muscle weakness (generalized) (M62.81);Unsteadiness on feet (R26.81)                Time: 8757-9728 OT Time Calculation (min): 34 min Charges:  OT General Charges $OT Visit: 1 Visit OT Evaluation $OT Eval Low Complexity: 1 Low OT Treatments $Therapeutic Activity: 8-22 mins  Malachy Chamber, OTR/L Acute Rehab Services Office: [phone]   Layla Maw 07/05/2022, 10:56 AM

## 2022-07-05 NOTE — ED Notes (Addendum)
ED TO INPATIENT HANDOFF REPORT  ED Nurse Name and Phone #: [phone]   S Name/Age/Gender [name] 61 y.o. male Room/Bed: 038C/038C  Code Status   Code Status: Full Code  Home/SNF/Other Home Patient oriented to: self, place, time and situation  Is this baseline? Yes   Triage Complete: Triage complete  Chief Complaint Acute encephalopathy [G93.40]  Triage Note Per EMS patient is coming in for altered behavior as claimed by wife. Per wife/ ems patient has not been acting "normal" for the last couple of weeks. Per EMS patient goes to dialysis. Per EMS patient may have taken too many of his home medications today (oxycodone--narcan--).    Allergies Allergies  Allergen Reactions   Heparin Nausea Only, Swelling and Other (See Comments)    * * HIT * *  SWELLING REACTION UNSPECIFIED   DIAPHORESIS    Iodinated Contrast Media    Penicillin G Potassium [Penicillin G] Nausea Only and Other (See Comments)    DIAPHORESIS    Level of Care/Admitting Diagnosis ED Disposition     ED Disposition  Admit   Condition  --   Comment  Hospital Area: [place] [[id number]]  Level of Care: Telemetry Surgical [[id number]]  May place patient in observation at [facility] or [place] if equivalent level of care is available:: No  Covid Evaluation: Asymptomatic - no recent exposure (last 10 days) testing not required  Diagnosis: Acute encephalopathy [979892]  Admitting Physician: Marcelyn Bruins [[id number]]  Attending Physician: Marcelyn Bruins [phone]          B Medical/Surgery History Past Medical History:  Diagnosis Date   Arthritis    Atrial fibrillation (HCC)    CHF (congestive heart failure), NYHA class III ([place])    s/p heart transplant   Chronic kidney disease    end stage   Chronic systolic dysfunction of left ventricle    CVA (cerebral infarction)    Diabetes mellitus    PMH; Prior to heart transplant   Family history of coronary artery  disease    in both parents   GERD (gastroesophageal reflux disease)    Hyperlipidemia    Hypertension    Left bundle branch block    Morbid obesity ([place])    status post lap band   Myocardial infarction [facility])    prior to heart transplant   Nonischemic cardiomyopathy ([place])    prior to heart transplant   Obesity (BMI 30-39.9)    Obstructive sleep apnea    no longer needs CPAP after heart transplant per pt   Premature ventricular contractions    Prostate cancer (HCC)    SOB (shortness of breath)    Past Surgical History:  Procedure Laterality Date   CARDIAC PACEMAKER PLACEMENT  09/21/2009   Biventricular implantable cardioverter-defibrillator implantation      COLONOSCOPY W/ BIOPSIES AND POLYPECTOMY     CYSTOSCOPY WITH FULGERATION N/A 04/27/2021   Procedure: CYSTOSCOPY AND CLOT EVACUATION WITH BLADDER BIOPSY/ FUGARATION OF BLADDER/ FULGARATION OF PROSTATE ;  Surgeon: [name], MD;  Location: [facility];  Service: Urology;  Laterality: N/A;   FOOT SURGERY     left   HEART TRANSPLANT  2014   LAPAROSCOPIC GASTRIC BANDING  01/27/2007   LEFT VENTRICULAR ASSIST DEVICE     implanted at [place] Right 07/22/2017   Procedure: RIGHT TOTAL HIP ARTHROPLASTY ANTERIOR APPROACH;  Surgeon: Rod Can, MD;  Location: [place];  Service: Orthopedics;  Laterality: Right;  Needs RNFA   TOTAL KNEE ARTHROPLASTY Right 07/22/2017     A IV Location/Drains/Wounds Patient Lines/Drains/Airways Status     Active Line/Drains/Airways     Name Placement date Placement time Site Days   Peripheral IV 07/04/22 18 G Anterior;Distal;Left;Upper Arm 07/04/22  1523  Arm  1   Fistula / Graft Left Forearm --  --  Forearm  --   Fistula / Graft Right Upper arm Arteriovenous fistula --  --  Upper arm  --   Incision (Closed) 07/22/17 Hip Right 07/22/17  1049  -- 1809   Incision (Closed) 04/27/21 Penis Other (Comment) 04/27/21  1634  -- 434             Intake/Output Last 24 hours No intake or output data in the 24 hours ending 07/05/22 1359  Labs/Imaging Results for orders placed or performed during the hospital encounter of 07/04/22 (from the past 48 hour(s))  CBG monitoring, ED     Status: Abnormal   Collection Time: 07/04/22  3:23 PM  Result Value Ref Range   Glucose-Capillary 114 (H) 70 - 99 mg/dL    Comment: Glucose reference range applies only to samples taken after fasting for at least 8 hours.  CBC with Differential     Status: Abnormal   Collection Time: 07/04/22  3:29 PM  Result Value Ref Range   WBC 9.6 4.0 - 10.5 K/uL   RBC 3.31 (L) 4.22 - 5.81 MIL/uL   Hemoglobin 9.7 (L) 13.0 - 17.0 g/dL   HCT 30.5 (L) 39.0 - 52.0 %   MCV 92.1 80.0 - 100.0 fL   MCH 29.3 26.0 - 34.0 pg   MCHC 31.8 30.0 - 36.0 g/dL   RDW 15.6 (H) 11.5 - 15.5 %   Platelets 196 150 - 400 K/uL   nRBC 0.0 0.0 - 0.2 %   Neutrophils Relative % 78 %   Neutro Abs 7.5 1.7 - 7.7 K/uL   Lymphocytes Relative 7 %   Lymphs Abs 0.7 0.7 - 4.0 K/uL   Monocytes Relative 11 %   Monocytes Absolute 1.0 0.1 - 1.0 K/uL   Eosinophils Relative 3 %   Eosinophils Absolute 0.3 0.0 - 0.5 K/uL   Basophils Relative 1 %   Basophils Absolute 0.1 0.0 - 0.1 K/uL   Immature Granulocytes 0 %   Abs Immature Granulocytes 0.04 0.00 - 0.07 K/uL    Comment: Performed at [facility], [address] [place], [place] [address]  Comprehensive metabolic panel     Status: Abnormal   Collection Time: 07/04/22  3:29 PM  Result Value Ref Range   Sodium 140 135 - 145 mmol/L   Potassium 3.8 3.5 - 5.1 mmol/L   Chloride 92 (L) 98 - 111 mmol/L   CO2 29 22 - 32 mmol/L   Glucose, Bld 113 (H) 70 - 99 mg/dL    Comment: Glucose reference range applies only to samples taken after fasting for at least 8 hours.   BUN 23 8 - 23 mg/dL   Creatinine, Ser 7.10 (H) 0.61 - 1.24 mg/dL   Calcium 9.2 8.9 - 10.3 mg/dL   Total Protein 7.7 6.5 - 8.1 g/dL   Albumin 3.1 (L) 3.5 - 5.0 g/dL   AST  18 15 - 41 U/L   ALT 21 0 - 44 U/L   Alkaline Phosphatase 87 38 - 126 U/L   Total Bilirubin 1.9 (H) 0.3 - 1.2 mg/dL  GFR, Estimated 8 (L) >60 mL/min    Comment: (NOTE) Calculated using the CKD-EPI Creatinine Equation (2021)    Anion gap 19 (H) 5 - 15    Comment: Performed at [facility], [place] [address], [place], [place] [address]  Lactic acid, plasma     Status: None   Collection Time: 07/04/22  3:29 PM  Result Value Ref Range   Lactic Acid, Venous 1.6 0.5 - 1.9 mmol/L    Comment: Performed at [facility] [place] [address] [place], [place] [address]  Blood culture (routine x 2)     Status: None (Preliminary result)   Collection Time: 07/04/22  9:22 PM   Specimen: BLOOD RIGHT HAND  Result Value Ref Range   Specimen Description BLOOD RIGHT HAND    Special Requests      BOTTLES DRAWN AEROBIC AND ANAEROBIC Blood Culture adequate volume   Culture      NO GROWTH < 24 HOURS Performed at [facility], [place] [address] [place], [place] [address]    Report Status PENDING   Blood culture (routine x 2)     Status: None (Preliminary result)   Collection Time: 07/04/22  9:22 PM   Specimen: BLOOD  Result Value Ref Range   Specimen Description BLOOD RIGHT ANTECUBITAL    Special Requests      BOTTLES DRAWN AEROBIC AND ANAEROBIC Blood Culture adequate volume   Culture      NO GROWTH < 24 HOURS Performed at [facility], [place] [address], [place], [place] [address]    Report Status PENDING   HIV Antibody (routine testing w rflx)     Status: None   Collection Time: 07/04/22  9:22 PM  Result Value Ref Range   HIV Screen 4th Generation wRfx Non Reactive Non Reactive    Comment: Performed at [facility], [place] [address], [place], [place] [address]  Respiratory (~20 pathogens) panel by PCR     Status: Abnormal   Collection Time: 07/04/22  9:27 PM   Specimen: Peripheral; Respiratory  Result Value Ref Range   Adenovirus NOT DETECTED NOT DETECTED   Coronavirus 229E  NOT DETECTED NOT DETECTED    Comment: (NOTE) The Coronavirus on the Respiratory Panel, DOES NOT test for the novel  Coronavirus (2019 nCoV)    Coronavirus HKU1 NOT DETECTED NOT DETECTED   Coronavirus NL63 NOT DETECTED NOT DETECTED   Coronavirus OC43 NOT DETECTED NOT DETECTED   Metapneumovirus NOT DETECTED NOT DETECTED   Rhinovirus / Enterovirus NOT DETECTED NOT DETECTED   Influenza A NOT DETECTED NOT DETECTED   Influenza B NOT DETECTED NOT DETECTED   Parainfluenza Virus 1 NOT DETECTED NOT DETECTED   Parainfluenza Virus 2 NOT DETECTED NOT DETECTED   Parainfluenza Virus 3 NOT DETECTED NOT DETECTED   Parainfluenza Virus 4 NOT DETECTED NOT DETECTED   Respiratory Syncytial Virus DETECTED (A) NOT DETECTED   Bordetella pertussis NOT DETECTED NOT DETECTED   Bordetella Parapertussis NOT DETECTED NOT DETECTED   Chlamydophila pneumoniae NOT DETECTED NOT DETECTED   Mycoplasma pneumoniae NOT DETECTED NOT DETECTED    Comment: Performed at [facility], [place] [address], [place], [place] [address]  SARS Coronavirus 2 by RT PCR (hospital order, performed in [facility] hospital lab) *cepheid single result test* Peripheral     Status: None   Collection Time: 07/04/22  9:27 PM   Specimen: Peripheral; Nasal Swab  Result Value Ref Range   SARS Coronavirus 2  by RT PCR NEGATIVE NEGATIVE    Comment: (NOTE) SARS-CoV-2 target nucleic acids are NOT DETECTED.  The SARS-CoV-2 RNA is generally detectable in upper and lower respiratory specimens during the acute phase of infection. The lowest concentration of SARS-CoV-2 viral copies this assay can detect is 250 copies / mL. A negative result does not preclude SARS-CoV-2 infection and should not be used as the sole basis for treatment or other patient management decisions.  A negative result may occur with improper specimen collection / handling, submission of specimen other than nasopharyngeal swab, presence of viral mutation(s) within the areas targeted  by this assay, and inadequate number of viral copies (<250 copies / mL). A negative result must be combined with clinical observations, patient history, and epidemiological information.  Fact Sheet for Patients:   https://www.patel.info/  Fact Sheet for Healthcare Providers: https://hall.com/  This test is not yet approved or  cleared by the [place] FDA and has been authorized for detection and/or diagnosis of SARS-CoV-2 by FDA under an Emergency Use Authorization (EUA).  This EUA will remain in effect (meaning this test can be used) for the duration of the COVID-19 declaration under Section 564(b)(1) of the Act, 21 U.S.C. section 360bbb-3(b)(1), unless the authorization is terminated or revoked sooner.  Performed at [facility], [place] [address], [place], [place] [address]   CBG monitoring, ED     Status: None   Collection Time: 07/04/22 10:50 PM  Result Value Ref Range   Glucose-Capillary 98 70 - 99 mg/dL    Comment: Glucose reference range applies only to samples taken after fasting for at least 8 hours.  Comprehensive metabolic panel     Status: Abnormal   Collection Time: 07/05/22  4:33 AM  Result Value Ref Range   Sodium 138 135 - 145 mmol/L   Potassium 4.1 3.5 - 5.1 mmol/L   Chloride 94 (L) 98 - 111 mmol/L   CO2 28 22 - 32 mmol/L   Glucose, Bld 94 70 - 99 mg/dL    Comment: Glucose reference range applies only to samples taken after fasting for at least 8 hours.   BUN 31 (H) 8 - 23 mg/dL   Creatinine, Ser 8.69 (H) 0.61 - 1.24 mg/dL   Calcium 8.6 (L) 8.9 - 10.3 mg/dL   Total Protein 6.5 6.5 - 8.1 g/dL   Albumin 2.7 (L) 3.5 - 5.0 g/dL   AST 16 15 - 41 U/L   ALT 18 0 - 44 U/L   Alkaline Phosphatase 78 38 - 126 U/L   Total Bilirubin 1.8 (H) 0.3 - 1.2 mg/dL   GFR, Estimated 6 (L) >60 mL/min    Comment: (NOTE) Calculated using the CKD-EPI Creatinine Equation (2021)    Anion gap 16 (H) 5 - 15    Comment: Performed at  [facility], [place] [address], [place], [place] [address]  CBC     Status: Abnormal   Collection Time: 07/05/22  4:33 AM  Result Value Ref Range   WBC 13.7 (H) 4.0 - 10.5 K/uL   RBC 3.09 (L) 4.22 - 5.81 MIL/uL   Hemoglobin 9.2 (L) 13.0 - 17.0 g/dL   HCT 28.0 (L) 39.0 - 52.0 %   MCV 90.6 80.0 - 100.0 fL   MCH 29.8 26.0 - 34.0 pg   MCHC 32.9 30.0 - 36.0 g/dL   RDW 15.6 (H) 11.5 - 15.5 %   Platelets 185 150 - 400 K/uL   nRBC 0.0 0.0 - 0.2 %  Comment: Performed at [facility], [place] [address] [place], [place] [address]  CBG monitoring, ED     Status: None   Collection Time: 07/05/22  8:09 AM  Result Value Ref Range   Glucose-Capillary 93 70 - 99 mg/dL    Comment: Glucose reference range applies only to samples taken after fasting for at least 8 hours.  CBG monitoring, ED     Status: Abnormal   Collection Time: 07/05/22 11:50 AM  Result Value Ref Range   Glucose-Capillary 112 (H) 70 - 99 mg/dL    Comment: Glucose reference range applies only to samples taken after fasting for at least 8 hours.   DG CHEST PORT 1 VIEW  Result Date: 07/05/2022 CLINICAL DATA:  Right rib fractures. EXAM: PORTABLE CHEST 1 VIEW COMPARISON:  Same day. FINDINGS: Stable cardiomegaly. Sternotomy wires are noted. Mild bibasilar subsegmental atelectasis is noted. Multiple right rib fractures are again noted. No definite pneumothorax is noted. IMPRESSION: Mild bibasilar subsegmental atelectasis. Multiple right rib fractures are again noted. Electronically Signed   By: Marijo Conception M.D.   On: 07/05/2022 08:21   DG Foot 2 Views Left  Result Date: 07/04/2022 CLINICAL DATA:  Fall EXAM: LEFT FOOT - 2 VIEW COMPARISON:  12/23/2017 FINDINGS: Vascular calcifications. Removal of previously noted surgical pin from the second digit. Fixating screw across the fourth PIP joint with osseous fusion. Defect base of the second distal phalanx with smooth sclerotic margin suggesting chronic deformity. There appears to be  resection of the second middle phalanx and the distal aspect of the second proximal phalanx. IMPRESSION: 1. No definitive acute osseous abnormality. 2. Postsurgical changes of the second and fourth digits. Chronic appearing deformity at the second distal removal of previously noted fixating pin in the second digit with apparent interval resection of the distal aspect second proximal phalanx as well as the middle phalanx of second digit. Chronic appearing defect base of the second distal phalanx. Electronically Signed   By: Donavan Foil M.D.   On: 07/04/2022 20:31   DG Foot 2 Views Right  Result Date: 07/04/2022 CLINICAL DATA:  Fall EXAM: RIGHT FOOT - 2 VIEW COMPARISON:  None Available. FINDINGS: Bones appear demineralized. Mild degenerative change at the first MTP joint. Malalignment at the second TMT joint with lateral subluxation of the base of the metatarsal. Heterogeneous sclerosis at the base of the second and probably third metatarsals but slightly limited assessment due to positioning and osseous superimposition. Vascular calcifications. Possible old deformity at the distal fibula on lateral view. Dorsal degenerative change at the TMT joints. IMPRESSION: 1. Appearance of malalignment at the second and probably third TMT joints as may be seen with Lisfranc injury. Apparent heterogeneous sclerosis at the base of the second and probably third metatarsal bases suggesting chronic fracture Electronically Signed   By: Donavan Foil M.D.   On: 07/04/2022 20:28   CT Head Wo Contrast  Result Date: 07/04/2022 CLINICAL DATA:  Interval trauma with new right rib fractures. Possible falls. Altered mental status. EXAM: CT HEAD WITHOUT CONTRAST CT CERVICAL SPINE WITHOUT CONTRAST TECHNIQUE: Multidetector CT imaging of the head and cervical spine was performed following the standard protocol without intravenous contrast. Multiplanar CT image reconstructions of the cervical spine were also generated. RADIATION DOSE  REDUCTION: This exam was performed according to the departmental dose-optimization program which includes automated exposure control, adjustment of the mA and/or kV according to patient size and/or use of iterative reconstruction technique. COMPARISON:  06/24/2022 FINDINGS: CT HEAD FINDINGS  Brain: Stable appearance of encephalomalacia from remote right frontal lobe infarct, no change or progression. Periventricular white matter and corona radiata hypodensities favor chronic ischemic microvascular white matter disease. Otherwise, the brainstem, cerebellum, cerebral peduncles, thalamus, basal ganglia, basilar cisterns, and ventricular system appear within normal limits. No intracranial hemorrhage, mass lesion, or acute CVA. Vascular: There is atherosclerotic calcification of the cavernous carotid arteries bilaterally. Skull: Unremarkable Sinuses/Orbits: Unremarkable Other: No supplemental non-categorized findings. CT CERVICAL SPINE FINDINGS Alignment: 3 mm chronic degenerative anterolisthesis at C2-3. Loss of the normal cervical lordosis, which can be associated with muscle spasm. Skull base and vertebrae: Fused left facet joint at C3-4 along with some probable mild interbody fusion this level. Reduced signal to noise ratio in the lower cervical spine due to body habitus. Stable spurring and possible small erosions versus degenerative subcortical cysts at the anterior C1-2 articulation. Paragraphs there is loss of intervertebral disc height along with mild endplate irregularity at all levels between C2 and C7, as before, compatible with degenerative disc disease and degenerative endplate findings. No cervical spine fracture or acute bony findings identified. Soft tissues and spinal canal: Unremarkable Disc levels: Bilateral osseous foraminal stenosis at C3-4 due to uncinate spurring. There is likely mild left foraminal stenosis at C6-7 due to uncinate spurring. Upper chest: Lung apices appear clear. Other: Lucent  lesion in the left first rib with trabecular coarsening as shown on image 64 series 4. This had mixed but mostly high T2 signal on the MRI from 11/27/2021 and could well be a hemangioma but is not entirely technically specific. This lesion is fairly indistinct on radiography condom, manifesting only is faint speckled density on chest radiographs, and is difficult to corroborate on older chest radiograph studies. A lytic lesion of bone such as a metastatic lesion is considered less likely but not entirely excluded given the limited characterization of this lesion. There is no adjacent soft tissue mass. IMPRESSION: 1. No acute intracranial findings or acute cervical spine findings. 2. Stable encephalomalacia from remote right frontal lobe infarct. 3. Periventricular white matter and corona radiata hypodensities favor chronic ischemic microvascular white matter disease. 4. Lucent lesion in the left first rib with trabecular coarsening. This has mixed but mostly high T2 signal on the MRI from 11/27/2021 and could well be a hemangioma but is not entirely technically specific. No adjacent soft tissue mass. This might be further characterized with whole-body bone scan or MRI of the left upper chest with attention to the left first rib. 5. Atherosclerosis. 6. Cervical spondylosis and degenerative disc disease causing bilateral osseous foraminal stenosis at C3-4 and mild left foraminal stenosis at C6-7. Electronically Signed   By: Van Clines M.D.   On: 07/04/2022 18:05   CT Cervical Spine Wo Contrast  Result Date: 07/04/2022 CLINICAL DATA:  Interval trauma with new right rib fractures. Possible falls. Altered mental status. EXAM: CT HEAD WITHOUT CONTRAST CT CERVICAL SPINE WITHOUT CONTRAST TECHNIQUE: Multidetector CT imaging of the head and cervical spine was performed following the standard protocol without intravenous contrast. Multiplanar CT image reconstructions of the cervical spine were also generated.  RADIATION DOSE REDUCTION: This exam was performed according to the departmental dose-optimization program which includes automated exposure control, adjustment of the mA and/or kV according to patient size and/or use of iterative reconstruction technique. COMPARISON:  06/24/2022 FINDINGS: CT HEAD FINDINGS Brain: Stable appearance of encephalomalacia from remote right frontal lobe infarct, no change or progression. Periventricular white matter and corona radiata hypodensities favor chronic ischemic microvascular white matter  disease. Otherwise, the brainstem, cerebellum, cerebral peduncles, thalamus, basal ganglia, basilar cisterns, and ventricular system appear within normal limits. No intracranial hemorrhage, mass lesion, or acute CVA. Vascular: There is atherosclerotic calcification of the cavernous carotid arteries bilaterally. Skull: Unremarkable Sinuses/Orbits: Unremarkable Other: No supplemental non-categorized findings. CT CERVICAL SPINE FINDINGS Alignment: 3 mm chronic degenerative anterolisthesis at C2-3. Loss of the normal cervical lordosis, which can be associated with muscle spasm. Skull base and vertebrae: Fused left facet joint at C3-4 along with some probable mild interbody fusion this level. Reduced signal to noise ratio in the lower cervical spine due to body habitus. Stable spurring and possible small erosions versus degenerative subcortical cysts at the anterior C1-2 articulation. Paragraphs there is loss of intervertebral disc height along with mild endplate irregularity at all levels between C2 and C7, as before, compatible with degenerative disc disease and degenerative endplate findings. No cervical spine fracture or acute bony findings identified. Soft tissues and spinal canal: Unremarkable Disc levels: Bilateral osseous foraminal stenosis at C3-4 due to uncinate spurring. There is likely mild left foraminal stenosis at C6-7 due to uncinate spurring. Upper chest: Lung apices appear clear.  Other: Lucent lesion in the left first rib with trabecular coarsening as shown on image 64 series 4. This had mixed but mostly high T2 signal on the MRI from 11/27/2021 and could well be a hemangioma but is not entirely technically specific. This lesion is fairly indistinct on radiography condom, manifesting only is faint speckled density on chest radiographs, and is difficult to corroborate on older chest radiograph studies. A lytic lesion of bone such as a metastatic lesion is considered less likely but not entirely excluded given the limited characterization of this lesion. There is no adjacent soft tissue mass. IMPRESSION: 1. No acute intracranial findings or acute cervical spine findings. 2. Stable encephalomalacia from remote right frontal lobe infarct. 3. Periventricular white matter and corona radiata hypodensities favor chronic ischemic microvascular white matter disease. 4. Lucent lesion in the left first rib with trabecular coarsening. This has mixed but mostly high T2 signal on the MRI from 11/27/2021 and could well be a hemangioma but is not entirely technically specific. No adjacent soft tissue mass. This might be further characterized with whole-body bone scan or MRI of the left upper chest with attention to the left first rib. 5. Atherosclerosis. 6. Cervical spondylosis and degenerative disc disease causing bilateral osseous foraminal stenosis at C3-4 and mild left foraminal stenosis at C6-7. Electronically Signed   By: Van Clines M.D.   On: 07/04/2022 18:05   CT CHEST ABDOMEN PELVIS WO CONTRAST  Result Date: 07/04/2022 CLINICAL DATA:  Altered behavior, possible medication overdose. Anti coagulation. Fall on 06/24/2022. Dialysis this morning. New rib fractures on chest radiography today. EXAM: CT CHEST, ABDOMEN AND PELVIS WITHOUT CONTRAST TECHNIQUE: Multidetector CT imaging of the chest, abdomen and pelvis was performed following the standard protocol without IV contrast. RADIATION DOSE  REDUCTION: This exam was performed according to the departmental dose-optimization program which includes automated exposure control, adjustment of the mA and/or kV according to patient size and/or use of iterative reconstruction technique. COMPARISON:  Multiple exams, including 06/24/2022 and 04/23/2021 FINDINGS: CT CHEST FINDINGS Cardiovascular: Atherosclerotic calcification of the thoracic aorta and coronary arteries along with cardiomegaly. Substantially enlarged but stable main pulmonary artery and right atrium. There are some pericardial and epicardial calcifications along with some venous calcifications probably related to prior indwelling catheters. Mediastinum/Nodes: No pathologic adenopathy. Lungs/Pleura: Mildly improved atelectasis in the right lower lobe compared  to the 06/24/2022 exam. Peribronchovascular reticulonodular and tree-in-bud opacities in the left upper lobe for example on image 54 series 4, likely from atypical infectious process. Continued substantial volume loss and some consolidation in the left lower lobe, roughly similar to prior, encompassing most of the left lower lobe. Musculoskeletal: There are a variety of newly visible right rib fractures compared to the prior exam. These include fractures of the right third, fourth, fifth, sixth, and seventh ribs. The fourth, fifth, sixth, and seventh rib fractures are segmental, with the fourth, fifth, and sixth rib fractures being multi segmental (fractures in 3 locations in each rib). However, for the most part the rib fractures are nondisplaced or only minimally displaced. No thoracic spine fracture is identified. CT ABDOMEN PELVIS FINDINGS Hepatobiliary: Unremarkable.  Contracted gallbladder. Pancreas: Unremarkable Spleen: Unremarkable Adrenals/Urinary Tract: Innumerable cysts of varying complexity throughout both kidneys compatible with autosomal dominant polycystic kidney disease, similar distribution to prior. Adrenal glands unremarkable.  Urinary bladder empty. Small punctate calcification along the renal collecting systems likely mostly vascular although a component of nonobstructive renal calculi are difficult to exclude particularly on the left side. Stomach/Bowel: Prior sleeve gastrectomy. Prominent stool throughout the colon favors constipation. No dilated small bowel. Normal appendix. Vascular/Lymphatic: Atherosclerosis is present, including aortoiliac atherosclerotic disease. No pathologic adenopathy. Reproductive: Fiducials along the prostate gland. Other: No supplemental non-categorized findings. Musculoskeletal: Right total hip prosthesis. Lower lumbar degenerative facet arthropathy probably with facet joint effusions at L4-5 bilaterally. Umbilical hernia contains adipose tissue. Moderate degenerative arthropathy of the left hip. IMPRESSION: 1. There are a variety of newly visible right rib fractures compared to the prior exam. These include fractures of the right third, fourth, fifth, sixth, and seventh ribs. The fourth, fifth, and sixth rib fractures are segmental, with the fourth, fifth, and sixth rib fractures being multi segmental. However, for the most part the rib fractures are nondisplaced or only minimally displaced. No pneumothorax or substantial pleural effusion. 2. Continued substantial volume loss and some consolidation in the left lower lobe, roughly similar to prior. Much of this is chronic back through 2016. 3. Peribronchovascular reticulonodular and tree-in-bud opacities in the left upper lobe, likely from atypical infectious process. 4. Cardiomegaly with considerable enlargement of the main pulmonary artery and right atrium, suggesting pulmonary arterial hypertension. 5. Prominent stool throughout the colon favors constipation. 6. Autosomal dominant polycystic kidney disease. Cannot exclude punctate nonobstructive left nephrolithiasis. 7. Umbilical hernia contains adipose tissue. 8. Moderate degenerative arthropathy of the  left hip. 9. Lower lumbar degenerative facet arthropathy probably with facet joint effusions at L4-5 bilaterally. 10. Aortic atherosclerosis. Aortic Atherosclerosis (ICD10-I70.0). Electronically Signed   By: Van Clines M.D.   On: 07/04/2022 17:46   DG Chest Portable 1 View  Result Date: 07/04/2022 CLINICAL DATA:  Altered mental status.  Right-sided chest pain. EXAM: PORTABLE CHEST 1 VIEW COMPARISON:  11/09/2018 radiography.  Chest CT 06/24/2022 FINDINGS: Previous median sternotomy. Cardiomegaly. Tortuous aorta. The patient has taken a poor inspiration. Allowing for that, there may be mild volume loss at both lung bases. Upper lobes appear clear. No visible effusion. There are multiple rib fractures on the right which are new since the chest CT of 10 days ago, including ribs 2 through 7. No pneumothorax. IMPRESSION: 1. Multiple rib fractures on the right which are new since the chest CT of 10 days ago. No pneumothorax. 2. Poor inspiration. Allowing for that, there may be mild volume loss at both lung bases. Electronically Signed   By: Jan Fireman.D.  On: 07/04/2022 15:49    Pending Labs Unresulted Labs (From admission, onward)     Start     Ordered   07/06/22 0500  Renal function panel  Tomorrow morning,   R        07/05/22 0906   07/05/22 0847  Hepatitis B surface antigen  ([facility] (Hepatitis B))  Once,   R        07/05/22 0847   07/05/22 0847  Hepatitis B surface antibody,quantitative  ([facility] (Hepatitis B))  Once,   R        07/05/22 0847   Signed and Held  Renal function panel  Tomorrow morning,   R        Signed and Held   Signed and Held  CBC  Tomorrow morning,   R        Signed and Held            Vitals/Pain Today's Vitals   07/05/22 1147 07/05/22 1155 07/05/22 1249 07/05/22 1300  BP:    (!) 84/46  Pulse: 95   87  Resp:      Temp:      TempSrc:      SpO2: 90% 95%  98%  Weight:      Height:      PainSc:   9      Isolation  Precautions Airborne and Contact precautions  Medications Medications  midodrine (PROAMATINE) tablet 10 mg (10 mg Oral Given 07/05/22 0946)  citalopram (CELEXA) tablet 20 mg (20 mg Oral Given 07/05/22 1000)  sevelamer carbonate (RENVELA) tablet 1,600 mg (1,600 mg Oral Given 07/05/22 1248)  oxybutynin (DITROPAN) tablet 5 mg (has no administration in time range)  mycophenolate (MYFORTIC) EC tablet 720 mg (720 mg Oral Given 07/05/22 0947)  rosuvastatin (CRESTOR) tablet 10 mg (10 mg Oral Given 07/05/22 0945)  sodium chloride flush (NS) 0.9 % injection 3 mL (3 mLs Intravenous Given 07/05/22 0952)  polyethylene glycol (MIRALAX / GLYCOLAX) packet 17 g (has no administration in time range)  cycloSPORINE (SANDIMMUNE) capsule 175 mg (175 mg Oral Given 07/05/22 0948)  insulin aspart (novoLOG) injection 0-6 Units ( Subcutaneous Not Given 07/05/22 1249)  acetaminophen (TYLENOL) tablet 650 mg (650 mg Oral Given 07/05/22 0947)    Or  acetaminophen (TYLENOL) suppository 650 mg ( Rectal See Alternative 07/05/22 0947)  lidocaine (LIDODERM) 5 % 1 patch (1 patch Transdermal Patch Applied 07/05/22 0952)  Chlorhexidine Gluconate Cloth 2 % PADS 6 each (6 each Topical Not Given 07/05/22 1121)  oxyCODONE (Oxy IR/ROXICODONE) immediate release tablet 5 mg (has no administration in time range)  guaiFENesin (MUCINEX) 12 hr tablet 600 mg (600 mg Oral Given 07/05/22 1248)  apixaban (ELIQUIS) tablet 2.5 mg (2.5 mg Oral Given 07/05/22 1347)  fentaNYL (SUBLIMAZE) injection 50 mcg (50 mcg Intravenous Given 07/04/22 1918)    Mobility non-ambulatory High fall risk   Focused Assessments Neuro Assessment Handoff:  Swallow screen pass? Yes          Neuro Assessment: Exceptions to [facility] Neuro Checks:      Last Documented NIHSS Modified Score:   Has TPA been given? No If patient is a Neuro Trauma and patient is going to OR before floor call report to [place] nurse: [phone] or [phone]   R Recommendations: See  Admitting Provider Note  Report given to:   Additional Notes:

## 2022-07-05 NOTE — Progress Notes (Signed)
PROGRESS NOTE   CHA GOMILLION  SWN:[id number] DOB: 12/28/1960 DOA: 07/04/2022 PCP: Carol Ada, MD  Brief Narrative:   61 year old black male OSA no CPAP ESRD on HD MWF [facility]-i not a transplant candidate Prostate cancer CAD with heart transplant 2014 currently on Myfortic and cyclosporine Significant weight loss status post gastric banding 01/2017 CVA with right cortical/subcortical brain infarcts 2/2 A-fib, ischemic cardiomyopathy with EF 10 to 15% previously--subsequent possible stroke 11/11/2018 but felt more to be toxic metabolic encephalopathy  Was seen in ED 06/24/2022 with a fall and was found to have acute fracture of fifth rib but no other injury and was sent home  Review presented to emergency room 11/8 with abnormal behavior--this is in the setting of taking oxycodone-he was confused and was not able to give history in the ED Work-up revealed chloride 94 BUN/creatinine 31/8.6 bilirubin 1.8 WBC 13.7 hemoglobin 9.2 platelet 185  RSV tested positive COVID-negative  Imaging showed postsurgical changes second and fourth digits left foot Left first rib showed trabecular coarsening?  Hemangioma Encephalomalacia found in right frontal lobe cervical spondylosis noted CXR = multiple rib fractures on the right that are new no pneumothorax poor inspiration   Hospital-Problem based course  Toxic metabolic likely secondary to polypharmacy in the setting of ESRD MWF -Etiology most likely secondary to increased use of oxycodone - Mental status seems clear he seems to be at baseline although is a little sleepy - Try to cut back/avoid narcotics, trauma has seen the patient, x-ray seems stable of chest with no pneumothorax and only atelectasis so they will sign off - Oxycodone downward adjusted to 5 mg discontinued all IV,  Lyrica doses and held Trileptal as well as Robaxin  Fall with multiple rib fractures, prior fall on 10/29 with fifth rib  fractures -We will ask PT to evaluate the patient for recommendations -Has chronic right foot fractures since 05/07/2022 he is wearing a cam walker and this can be followed by [facility]  RSV positive - No symptoms no cough cold -Symptomatic management  ESRD not kidney transplant candidate HD MWF Secondary hyperparathyroidism anemia renal disease - Labs seem appropriate, hemoglobin is stable - Get phosphorus in a.m.-continues on midodrine 10 3 times daily to support dialysis, Renvela 1600 mg 3 times daily - Consult nephrology today for further dialysis needs  Prior heart transplant 2014 History of NICM-last echo EF 60-65% - Resumed Myfortic 720 twice daily, cyclosporine 175 twice daily - Not a candidate for GDMT given hypotension  Depression - Continue Celexa 20 daily, note Trileptal has been held    DVT prophylaxis:  Code Status:  Family Communication: called spouse Meredith Mody [phone] Disposition:  Status is: Observation The patient will require care spanning > 2 midnights and should be moved to inpatient because:   Need therapy eval   Consultants:  nephrology  Procedures:  no  Antimicrobials: no    Subjective:  Awake no distress, no SOB, no fever no cp cough Awakens but cannot remember some details Has had  Objective: Vitals:   07/05/22 0500 07/05/22 0530 07/05/22 0557 07/05/22 0631  BP: (!) 94/59 (!) 87/59  (!) 92/59  Pulse: 97 95  95  Resp:    17  Temp:   97.8 F (36.6 C)   TempSrc:   Oral   SpO2: 92% 93%  91%  Weight:      Height:       No intake or output data in the 24 hours ending 07/05/22 0654 Filed Weights  07/04/22 1620  Weight: (!) 142.9 kg    Examination:  Awake Thick neck soft supple  Eomi ncat  Cta b  S1 s2 no m/r/g seems sinus Abd soft nontender no rebound no guarding does have a midline deficit in the lower quadrant Neurologically moving 4 limbs equally without focal deficit Fistula in right upper extremity is working fairly  well in the forearm with good thrill  Data Reviewed: personally reviewed   CBC    Component Value Date/Time   WBC 13.7 (H) 07/05/2022 0433   RBC 3.09 (L) 07/05/2022 0433   HGB 9.2 (L) 07/05/2022 0433   HCT 28.0 (L) 07/05/2022 0433   PLT 185 07/05/2022 0433   MCV 90.6 07/05/2022 0433   MCH 29.8 07/05/2022 0433   MCHC 32.9 07/05/2022 0433   RDW 15.6 (H) 07/05/2022 0433   LYMPHSABS 0.7 07/04/2022 1529   MONOABS 1.0 07/04/2022 1529   EOSABS 0.3 07/04/2022 1529   BASOSABS 0.1 07/04/2022 1529      Latest Ref Rng & Units 07/05/2022    4:33 AM 07/04/2022    3:29 PM 04/27/2021    3:25 PM  CMP  Glucose 70 - 99 mg/dL 94  113    BUN 8 - 23 mg/dL 31  23    Creatinine 0.61 - 1.24 mg/dL 8.69  7.10    Sodium 135 - 145 mmol/L 138  140  138   Potassium 3.5 - 5.1 mmol/L 4.1  3.8  4.6   Chloride 98 - 111 mmol/L 94  92    CO2 22 - 32 mmol/L 28  29    Calcium 8.9 - 10.3 mg/dL 8.6  9.2    Total Protein 6.5 - 8.1 g/dL 6.5  7.7    Total Bilirubin 0.3 - 1.2 mg/dL 1.8  1.9    Alkaline Phos 38 - 126 U/L 78  87    AST 15 - 41 U/L 16  18    ALT 0 - 44 U/L 18  21       Radiology Studies: DG Foot 2 Views Left  Result Date: 07/04/2022 CLINICAL DATA:  Fall EXAM: LEFT FOOT - 2 VIEW COMPARISON:  12/23/2017 FINDINGS: Vascular calcifications. Removal of previously noted surgical pin from the second digit. Fixating screw across the fourth PIP joint with osseous fusion. Defect base of the second distal phalanx with smooth sclerotic margin suggesting chronic deformity. There appears to be resection of the second middle phalanx and the distal aspect of the second proximal phalanx. IMPRESSION: 1. No definitive acute osseous abnormality. 2. Postsurgical changes of the second and fourth digits. Chronic appearing deformity at the second distal removal of previously noted fixating pin in the second digit with apparent interval resection of the distal aspect second proximal phalanx as well as the middle phalanx of second  digit. Chronic appearing defect base of the second distal phalanx. Electronically Signed   By: Donavan Foil M.D.   On: 07/04/2022 20:31   DG Foot 2 Views Right  Result Date: 07/04/2022 CLINICAL DATA:  Fall EXAM: RIGHT FOOT - 2 VIEW COMPARISON:  None Available. FINDINGS: Bones appear demineralized. Mild degenerative change at the first MTP joint. Malalignment at the second TMT joint with lateral subluxation of the base of the metatarsal. Heterogeneous sclerosis at the base of the second and probably third metatarsals but slightly limited assessment due to positioning and osseous superimposition. Vascular calcifications. Possible old deformity at the distal fibula on lateral view. Dorsal degenerative change at the TMT joints. IMPRESSION:  1. Appearance of malalignment at the second and probably third TMT joints as may be seen with Lisfranc injury. Apparent heterogeneous sclerosis at the base of the second and probably third metatarsal bases suggesting chronic fracture Electronically Signed   By: Donavan Foil M.D.   On: 07/04/2022 20:28   CT Head Wo Contrast  Result Date: 07/04/2022 CLINICAL DATA:  Interval trauma with new right rib fractures. Possible falls. Altered mental status. EXAM: CT HEAD WITHOUT CONTRAST CT CERVICAL SPINE WITHOUT CONTRAST TECHNIQUE: Multidetector CT imaging of the head and cervical spine was performed following the standard protocol without intravenous contrast. Multiplanar CT image reconstructions of the cervical spine were also generated. RADIATION DOSE REDUCTION: This exam was performed according to the departmental dose-optimization program which includes automated exposure control, adjustment of the mA and/or kV according to patient size and/or use of iterative reconstruction technique. COMPARISON:  06/24/2022 FINDINGS: CT HEAD FINDINGS Brain: Stable appearance of encephalomalacia from remote right frontal lobe infarct, no change or progression. Periventricular white matter and  corona radiata hypodensities favor chronic ischemic microvascular white matter disease. Otherwise, the brainstem, cerebellum, cerebral peduncles, thalamus, basal ganglia, basilar cisterns, and ventricular system appear within normal limits. No intracranial hemorrhage, mass lesion, or acute CVA. Vascular: There is atherosclerotic calcification of the cavernous carotid arteries bilaterally. Skull: Unremarkable Sinuses/Orbits: Unremarkable Other: No supplemental non-categorized findings. CT CERVICAL SPINE FINDINGS Alignment: 3 mm chronic degenerative anterolisthesis at C2-3. Loss of the normal cervical lordosis, which can be associated with muscle spasm. Skull base and vertebrae: Fused left facet joint at C3-4 along with some probable mild interbody fusion this level. Reduced signal to noise ratio in the lower cervical spine due to body habitus. Stable spurring and possible small erosions versus degenerative subcortical cysts at the anterior C1-2 articulation. Paragraphs there is loss of intervertebral disc height along with mild endplate irregularity at all levels between C2 and C7, as before, compatible with degenerative disc disease and degenerative endplate findings. No cervical spine fracture or acute bony findings identified. Soft tissues and spinal canal: Unremarkable Disc levels: Bilateral osseous foraminal stenosis at C3-4 due to uncinate spurring. There is likely mild left foraminal stenosis at C6-7 due to uncinate spurring. Upper chest: Lung apices appear clear. Other: Lucent lesion in the left first rib with trabecular coarsening as shown on image 64 series 4. This had mixed but mostly high T2 signal on the MRI from 11/27/2021 and could well be a hemangioma but is not entirely technically specific. This lesion is fairly indistinct on radiography condom, manifesting only is faint speckled density on chest radiographs, and is difficult to corroborate on older chest radiograph studies. A lytic lesion of bone  such as a metastatic lesion is considered less likely but not entirely excluded given the limited characterization of this lesion. There is no adjacent soft tissue mass. IMPRESSION: 1. No acute intracranial findings or acute cervical spine findings. 2. Stable encephalomalacia from remote right frontal lobe infarct. 3. Periventricular white matter and corona radiata hypodensities favor chronic ischemic microvascular white matter disease. 4. Lucent lesion in the left first rib with trabecular coarsening. This has mixed but mostly high T2 signal on the MRI from 11/27/2021 and could well be a hemangioma but is not entirely technically specific. No adjacent soft tissue mass. This might be further characterized with whole-body bone scan or MRI of the left upper chest with attention to the left first rib. 5. Atherosclerosis. 6. Cervical spondylosis and degenerative disc disease causing bilateral osseous foraminal stenosis at C3-4  and mild left foraminal stenosis at C6-7. Electronically Signed   By: Van Clines M.D.   On: 07/04/2022 18:05   CT Cervical Spine Wo Contrast  Result Date: 07/04/2022 CLINICAL DATA:  Interval trauma with new right rib fractures. Possible falls. Altered mental status. EXAM: CT HEAD WITHOUT CONTRAST CT CERVICAL SPINE WITHOUT CONTRAST TECHNIQUE: Multidetector CT imaging of the head and cervical spine was performed following the standard protocol without intravenous contrast. Multiplanar CT image reconstructions of the cervical spine were also generated. RADIATION DOSE REDUCTION: This exam was performed according to the departmental dose-optimization program which includes automated exposure control, adjustment of the mA and/or kV according to patient size and/or use of iterative reconstruction technique. COMPARISON:  06/24/2022 FINDINGS: CT HEAD FINDINGS Brain: Stable appearance of encephalomalacia from remote right frontal lobe infarct, no change or progression. Periventricular white  matter and corona radiata hypodensities favor chronic ischemic microvascular white matter disease. Otherwise, the brainstem, cerebellum, cerebral peduncles, thalamus, basal ganglia, basilar cisterns, and ventricular system appear within normal limits. No intracranial hemorrhage, mass lesion, or acute CVA. Vascular: There is atherosclerotic calcification of the cavernous carotid arteries bilaterally. Skull: Unremarkable Sinuses/Orbits: Unremarkable Other: No supplemental non-categorized findings. CT CERVICAL SPINE FINDINGS Alignment: 3 mm chronic degenerative anterolisthesis at C2-3. Loss of the normal cervical lordosis, which can be associated with muscle spasm. Skull base and vertebrae: Fused left facet joint at C3-4 along with some probable mild interbody fusion this level. Reduced signal to noise ratio in the lower cervical spine due to body habitus. Stable spurring and possible small erosions versus degenerative subcortical cysts at the anterior C1-2 articulation. Paragraphs there is loss of intervertebral disc height along with mild endplate irregularity at all levels between C2 and C7, as before, compatible with degenerative disc disease and degenerative endplate findings. No cervical spine fracture or acute bony findings identified. Soft tissues and spinal canal: Unremarkable Disc levels: Bilateral osseous foraminal stenosis at C3-4 due to uncinate spurring. There is likely mild left foraminal stenosis at C6-7 due to uncinate spurring. Upper chest: Lung apices appear clear. Other: Lucent lesion in the left first rib with trabecular coarsening as shown on image 64 series 4. This had mixed but mostly high T2 signal on the MRI from 11/27/2021 and could well be a hemangioma but is not entirely technically specific. This lesion is fairly indistinct on radiography condom, manifesting only is faint speckled density on chest radiographs, and is difficult to corroborate on older chest radiograph studies. A lytic  lesion of bone such as a metastatic lesion is considered less likely but not entirely excluded given the limited characterization of this lesion. There is no adjacent soft tissue mass. IMPRESSION: 1. No acute intracranial findings or acute cervical spine findings. 2. Stable encephalomalacia from remote right frontal lobe infarct. 3. Periventricular white matter and corona radiata hypodensities favor chronic ischemic microvascular white matter disease. 4. Lucent lesion in the left first rib with trabecular coarsening. This has mixed but mostly high T2 signal on the MRI from 11/27/2021 and could well be a hemangioma but is not entirely technically specific. No adjacent soft tissue mass. This might be further characterized with whole-body bone scan or MRI of the left upper chest with attention to the left first rib. 5. Atherosclerosis. 6. Cervical spondylosis and degenerative disc disease causing bilateral osseous foraminal stenosis at C3-4 and mild left foraminal stenosis at C6-7. Electronically Signed   By: Van Clines M.D.   On: 07/04/2022 18:05   CT CHEST ABDOMEN PELVIS WO  CONTRAST  Result Date: 07/04/2022 CLINICAL DATA:  Altered behavior, possible medication overdose. Anti coagulation. Fall on 06/24/2022. Dialysis this morning. New rib fractures on chest radiography today. EXAM: CT CHEST, ABDOMEN AND PELVIS WITHOUT CONTRAST TECHNIQUE: Multidetector CT imaging of the chest, abdomen and pelvis was performed following the standard protocol without IV contrast. RADIATION DOSE REDUCTION: This exam was performed according to the departmental dose-optimization program which includes automated exposure control, adjustment of the mA and/or kV according to patient size and/or use of iterative reconstruction technique. COMPARISON:  Multiple exams, including 06/24/2022 and 04/23/2021 FINDINGS: CT CHEST FINDINGS Cardiovascular: Atherosclerotic calcification of the thoracic aorta and coronary arteries along with  cardiomegaly. Substantially enlarged but stable main pulmonary artery and right atrium. There are some pericardial and epicardial calcifications along with some venous calcifications probably related to prior indwelling catheters. Mediastinum/Nodes: No pathologic adenopathy. Lungs/Pleura: Mildly improved atelectasis in the right lower lobe compared to the 06/24/2022 exam. Peribronchovascular reticulonodular and tree-in-bud opacities in the left upper lobe for example on image 54 series 4, likely from atypical infectious process. Continued substantial volume loss and some consolidation in the left lower lobe, roughly similar to prior, encompassing most of the left lower lobe. Musculoskeletal: There are a variety of newly visible right rib fractures compared to the prior exam. These include fractures of the right third, fourth, fifth, sixth, and seventh ribs. The fourth, fifth, sixth, and seventh rib fractures are segmental, with the fourth, fifth, and sixth rib fractures being multi segmental (fractures in 3 locations in each rib). However, for the most part the rib fractures are nondisplaced or only minimally displaced. No thoracic spine fracture is identified. CT ABDOMEN PELVIS FINDINGS Hepatobiliary: Unremarkable.  Contracted gallbladder. Pancreas: Unremarkable Spleen: Unremarkable Adrenals/Urinary Tract: Innumerable cysts of varying complexity throughout both kidneys compatible with autosomal dominant polycystic kidney disease, similar distribution to prior. Adrenal glands unremarkable. Urinary bladder empty. Small punctate calcification along the renal collecting systems likely mostly vascular although a component of nonobstructive renal calculi are difficult to exclude particularly on the left side. Stomach/Bowel: Prior sleeve gastrectomy. Prominent stool throughout the colon favors constipation. No dilated small bowel. Normal appendix. Vascular/Lymphatic: Atherosclerosis is present, including aortoiliac  atherosclerotic disease. No pathologic adenopathy. Reproductive: Fiducials along the prostate gland. Other: No supplemental non-categorized findings. Musculoskeletal: Right total hip prosthesis. Lower lumbar degenerative facet arthropathy probably with facet joint effusions at L4-5 bilaterally. Umbilical hernia contains adipose tissue. Moderate degenerative arthropathy of the left hip. IMPRESSION: 1. There are a variety of newly visible right rib fractures compared to the prior exam. These include fractures of the right third, fourth, fifth, sixth, and seventh ribs. The fourth, fifth, and sixth rib fractures are segmental, with the fourth, fifth, and sixth rib fractures being multi segmental. However, for the most part the rib fractures are nondisplaced or only minimally displaced. No pneumothorax or substantial pleural effusion. 2. Continued substantial volume loss and some consolidation in the left lower lobe, roughly similar to prior. Much of this is chronic back through 2016. 3. Peribronchovascular reticulonodular and tree-in-bud opacities in the left upper lobe, likely from atypical infectious process. 4. Cardiomegaly with considerable enlargement of the main pulmonary artery and right atrium, suggesting pulmonary arterial hypertension. 5. Prominent stool throughout the colon favors constipation. 6. Autosomal dominant polycystic kidney disease. Cannot exclude punctate nonobstructive left nephrolithiasis. 7. Umbilical hernia contains adipose tissue. 8. Moderate degenerative arthropathy of the left hip. 9. Lower lumbar degenerative facet arthropathy probably with facet joint effusions at L4-5 bilaterally. 10. Aortic atherosclerosis. Aortic  Atherosclerosis (ICD10-I70.0). Electronically Signed   By: Van Clines M.D.   On: 07/04/2022 17:46   DG Chest Portable 1 View  Result Date: 07/04/2022 CLINICAL DATA:  Altered mental status.  Right-sided chest pain. EXAM: PORTABLE CHEST 1 VIEW COMPARISON:  11/09/2018  radiography.  Chest CT 06/24/2022 FINDINGS: Previous median sternotomy. Cardiomegaly. Tortuous aorta. The patient has taken a poor inspiration. Allowing for that, there may be mild volume loss at both lung bases. Upper lobes appear clear. No visible effusion. There are multiple rib fractures on the right which are new since the chest CT of 10 days ago, including ribs 2 through 7. No pneumothorax. IMPRESSION: 1. Multiple rib fractures on the right which are new since the chest CT of 10 days ago. No pneumothorax. 2. Poor inspiration. Allowing for that, there may be mild volume loss at both lung bases. Electronically Signed   By: Nelson Chimes M.D.   On: 07/04/2022 15:49     Scheduled Meds:  citalopram  20 mg Oral Daily   cycloSPORINE  175 mg Oral BID   insulin aspart  0-6 Units Subcutaneous TID WC   midodrine  10 mg Oral TID   mycophenolate  720 mg Oral BID   rosuvastatin  10 mg Oral Daily   sevelamer carbonate  1,600 mg Oral TID WC   sodium chloride flush  3 mL Intravenous Q12H   Continuous Infusions:   LOS: 0 days   Time spent: [place], MD [facility] To contact the attending provider between 7A-7P or the covering provider during after hours 7P-7A, please log into the web site [email or web address] and access using universal [place] password for that web site. If you do not have the password, please call the hospital operator.  07/05/2022, 6:54 AM

## 2022-07-05 NOTE — ED Notes (Signed)
Dr notified for blood pressure being low. Dr stated that it is his normal. Will continue to monitor.

## 2022-07-06 DIAGNOSIS — T402X1A Poisoning by other opioids, accidental (unintentional), initial encounter: Secondary | ICD-10-CM | POA: Diagnosis not present

## 2022-07-06 DIAGNOSIS — G9340 Encephalopathy, unspecified: Secondary | ICD-10-CM | POA: Diagnosis not present

## 2022-07-06 LAB — RENAL FUNCTION PANEL
Albumin: 2.5 g/dL — ABNORMAL LOW (ref 3.5–5.0)
Anion gap: 15 (ref 5–15)
BUN: 49 mg/dL — ABNORMAL HIGH (ref 8–23)
CO2: 28 mmol/L (ref 22–32)
Calcium: 8.8 mg/dL — ABNORMAL LOW (ref 8.9–10.3)
Chloride: 92 mmol/L — ABNORMAL LOW (ref 98–111)
Creatinine, Ser: 10.8 mg/dL — ABNORMAL HIGH (ref 0.61–1.24)
GFR, Estimated: 5 mL/min — ABNORMAL LOW (ref >60)
Glucose, Bld: 85 mg/dL (ref 70–99)
Phosphorus: 6.4 mg/dL — ABNORMAL HIGH (ref 2.5–4.6)
Potassium: 4.2 mmol/L (ref 3.5–5.1)
Sodium: 135 mmol/L (ref 135–145)

## 2022-07-06 LAB — CBC
HCT: 25.6 % — ABNORMAL LOW (ref 39.0–52.0)
Hemoglobin: 8.3 g/dL — ABNORMAL LOW (ref 13.0–17.0)
MCH: 29.3 pg (ref 26.0–34.0)
MCHC: 32.4 g/dL (ref 30.0–36.0)
MCV: 90.5 fL (ref 80.0–100.0)
Platelets: 174 10*3/uL (ref 150–400)
RBC: 2.83 MIL/uL — ABNORMAL LOW (ref 4.22–5.81)
RDW: 15.5 % (ref 11.5–15.5)
WBC: 15.3 10*3/uL — ABNORMAL HIGH (ref 4.0–10.5)
nRBC: 0 % (ref 0.0–0.2)

## 2022-07-06 LAB — GLUCOSE, CAPILLARY
Glucose-Capillary: 78 mg/dL (ref 70–99)
Glucose-Capillary: 77 mg/dL (ref 70–99)

## 2022-07-06 LAB — HEPATITIS B SURFACE ANTIBODY, QUANTITATIVE: Hep B S AB Quant (Post): 65.4 m[IU]/mL (ref >9.9)

## 2022-07-06 MED ORDER — MIDODRINE HCL 5 MG PO TABS
5.0000 mg | ORAL_TABLET | Freq: Once | ORAL | Status: AC
  Administered 2022-07-06: 5 mg via ORAL
  Filled 2022-07-06: qty 1

## 2022-07-06 MED ORDER — NEPRO/CARBSTEADY PO LIQD: 237.0000 mL | Freq: Two times a day (BID) | ORAL | Status: DC

## 2022-07-06 MED ORDER — PREGABALIN 300 MG PO CAPS: 300.0000 mg | ORAL_CAPSULE | Freq: Every day | ORAL | Status: DC

## 2022-07-06 MED ORDER — ALBUMIN HUMAN 25 % IV SOLN
12.5000 g | Freq: Once | INTRAVENOUS | Status: AC
  Administered 2022-07-06: 12.5 g via INTRAVENOUS
  Filled 2022-07-06: qty 50

## 2022-07-06 MED ORDER — OXYCODONE HCL 5 MG PO TABS: 5.0000 mg | ORAL_TABLET | ORAL | 0 refills | Status: DC | PRN

## 2022-07-06 MED ORDER — ACETAMINOPHEN 325 MG PO TABS: 650.0000 mg | ORAL_TABLET | Freq: Four times a day (QID) | ORAL | Status: DC

## 2022-07-06 MED ORDER — MIDODRINE HCL 5 MG PO TABS: 5.0000 mg | ORAL_TABLET | ORAL | Status: DC

## 2022-07-06 MED ORDER — ROSUVASTATIN CALCIUM 10 MG PO TABS: 10.0000 mg | ORAL_TABLET | Freq: Every day | ORAL | 1 refills | Status: DC

## 2022-07-06 NOTE — TOC Transition Note (Signed)
Transition of Care [facility]) - CM/SW Discharge Note   Patient Details  Name: [name] MRN: [id number] Date of Birth: 06-06-1961  [facility] of [facility] CM/SW Contact:  Tom-Johnson, Renea Ee, RN Phone Number: 07/06/2022, 10:55 AM   Clinical Narrative:     Patient is scheduled for discharge today. Home health referral called in to [facility] as wife has no preference, info on AVS. BSC ordered from [employer] and [facility] to deliver at bedside. Patient is in inpatient dialysis at this time and will discharge home later after dialysis and wife to transport at discharge.  No further TOC needs noted.      Final next level of care: [place] Barriers to Discharge: Barriers Resolved   Patient Goals and CMS Choice Patient states their goals for this hospitalization and ongoing recovery are:: To return home CMS Medicare.gov Compare Post Acute Care list provided to:: Patient Choice offered to / list presented to : Patient, Spouse  Discharge Placement                Patient to be transferred to facility by: Wife      Discharge Plan and Services                DME Arranged: Bedside commode DME Agency: [facility] Date DME Agency Contacted: 07/06/22 Time DME Agency Contacted: 70 Representative spoke with at DME Agency: [facility] HH Arranged: PT, OT, Nurse's Aide [place] Agency: [place] Date [place]: 07/06/22 Time [place]: 63 Representative spoke with at [place]: [place] ([place]) Interventions     Readmission Risk Interventions     No data to display

## 2022-07-06 NOTE — Progress Notes (Signed)
   07/06/22 1208  Vitals  Temp 98.3 F (36.8 C)  Temp Source Oral  BP 100/62  MAP (mmHg) 75  BP Location Left Leg  BP Method Automatic  Patient Position (if appropriate) Lying  Pulse Rate 81  Pulse Rate Source Monitor  ECG Heart Rate 80  Resp 18  Oxygen Therapy  SpO2 90 %  O2 Device Room Air  Pulse Oximetry Type Continuous   Received patient in bed to unit.  Alert and oriented.  Informed consent signed and in chart.   Treatment initiated: [place] Treatment completed: 1208  Patient tolerated well.  Transported back to the room  Alert, without acute distress.  Hand-off given to patient's nurse.   Access used: AVF Access issues: NA  Total UF removed: 2050 ml Medication(s) given: Albumin 25% 25G IVPB Post HD VS: see above Post HD weight:    [facility]

## 2022-07-06 NOTE — Progress Notes (Signed)
D/C order noted. Contacted [place] to advise clinic that pt will d/c today and resume care on Monday. Clinic also advised pt tested positive for RSV on admission.   Melven Sartorius Renal Navigator [phone]

## 2022-07-06 NOTE — Progress Notes (Signed)
Subjective: Seen on hemodialysis BP drop felt secondary to pain meds, and UF goal decreased.  Midodrine /albumin also given.  Now alert appears normal mental status  Objective Vital signs in last 24 hours: Vitals:   07/06/22 0654 07/06/22 0739 07/06/22 0759 07/06/22 0830  BP: 113/61 (!) 103/59 90/62 (!) 111/53  Pulse: 79 84 78 78  Resp:  (!) '22 13 13  '$ Temp:  98.2 F (36.8 C)    TempSrc:  Oral    SpO2: (!) 89% 96% 93% 95%  Weight:  (!) 142.2 kg    Height:       Weight change:   Physical Exam: General: Alert,in NAD Lungs: Clear bilaterally to auscultation without wheezes, rales, or rhonchi. Breathing is unlabored.  Heart: RRR no MRG . Abdomen: NABS, soft, ND NT Lower extremities: No edema b/l lower extremities. Dialysis Access: RUE AVF patent on HD  Dialysis Orders: Center: [facility]  MWF. Optiflux 200NRe 4 hours, BFR 500 DFR Auto 2 EDW 139.2kg 2K 2 Ca AVF buttonhole 14g no heparin Mircera 46mg IV q 4 weeks- last dose 06/22/22 Hectorol 566m IV q HD   Problem /plan Acute metabolic encephalopathy = secondary to  pain meds needed for rib fracture.  . There may also be a component of polypharmacy based on history. AT  Discharge pain med adjusted lower , lower dose of Lyrica and discontinued Robaxin per  discharge MD plans  ESRD:  on MWF schedule, on his regular schedule.   Hypertension/volume: BP soft, on midodrine.  Probably related to pain meds, no volume overload on CXR.  No EDW change  Anemia: Hgb 9.2.,  8.3 this morning ESA dose recently increased, not due for next dose yet.   Metabolic bone disease: Calcium controlled, continue VDRA and binders.   Nutrition:  Renal diet, 1.2L fluid restrictions Neuropathy: if neuropathy worsens in the future low dose gabapentin may be a better option than  Lyrica   DaErnest HaberPA-C [facility] Beeper [phone]/05/2022,9:09 AM  LOS: 0 days   Labs: Basic Metabolic Panel: Recent Labs  Lab 07/04/22 1529 07/05/22 0433  07/06/22 0355  NA 140 138 135  K 3.8 4.1 4.2  CL 92* 94* 92*  CO2 '29 28 28  '$ GLUCOSE 113* 94 85  BUN 23 31* 49*  CREATININE 7.10* 8.69* 10.80*  CALCIUM 9.2 8.6* 8.8*  PHOS  --   --  6.4*   Liver Function Tests: Recent Labs  Lab 07/04/22 1529 07/05/22 0433 07/06/22 0355  AST 18 16  --   ALT 21 18  --   ALKPHOS 87 78  --   BILITOT 1.9* 1.8*  --   PROT 7.7 6.5  --   ALBUMIN 3.1* 2.7* 2.5*   No results for input(s): "LIPASE", "AMYLASE" in the last 168 hours. No results for input(s): "AMMONIA" in the last 168 hours. CBC: Recent Labs  Lab 07/04/22 1529 07/05/22 0433 07/06/22 0355  WBC 9.6 13.7* 15.3*  NEUTROABS 7.5  --   --   HGB 9.7* 9.2* 8.3*  HCT 30.5* 28.0* 25.6*  MCV 92.1 90.6 90.5  PLT 196 185 174   Cardiac Enzymes: No results for input(s): "CKTOTAL", "CKMB", "CKMBINDEX", "TROPONINI" in the last 168 hours. CBG: Recent Labs  Lab 07/04/22 2250 07/05/22 0809 07/05/22 1150 07/05/22 1634 07/06/22 0708  GLUCAP 98 93 112* 106* 78    Studies/Results: DG CHEST PORT 1 VIEW  Result Date: 07/05/2022 CLINICAL DATA:  Right rib fractures. EXAM: PORTABLE CHEST 1 VIEW COMPARISON:  Same day. FINDINGS: Stable cardiomegaly. Sternotomy wires are noted. Mild bibasilar subsegmental atelectasis is noted. Multiple right rib fractures are again noted. No definite pneumothorax is noted. IMPRESSION: Mild bibasilar subsegmental atelectasis. Multiple right rib fractures are again noted. Electronically Signed   By: Marijo Conception M.D.   On: 07/05/2022 08:21   DG Foot 2 Views Left  Result Date: 07/04/2022 CLINICAL DATA:  Fall EXAM: LEFT FOOT - 2 VIEW COMPARISON:  12/23/2017 FINDINGS: Vascular calcifications. Removal of previously noted surgical pin from the second digit. Fixating screw across the fourth PIP joint with osseous fusion. Defect base of the second distal phalanx with smooth sclerotic margin suggesting chronic deformity. There appears to be resection of the second middle  phalanx and the distal aspect of the second proximal phalanx. IMPRESSION: 1. No definitive acute osseous abnormality. 2. Postsurgical changes of the second and fourth digits. Chronic appearing deformity at the second distal removal of previously noted fixating pin in the second digit with apparent interval resection of the distal aspect second proximal phalanx as well as the middle phalanx of second digit. Chronic appearing defect base of the second distal phalanx. Electronically Signed   By: Donavan Foil M.D.   On: 07/04/2022 20:31   DG Foot 2 Views Right  Result Date: 07/04/2022 CLINICAL DATA:  Fall EXAM: RIGHT FOOT - 2 VIEW COMPARISON:  None Available. FINDINGS: Bones appear demineralized. Mild degenerative change at the first MTP joint. Malalignment at the second TMT joint with lateral subluxation of the base of the metatarsal. Heterogeneous sclerosis at the base of the second and probably third metatarsals but slightly limited assessment due to positioning and osseous superimposition. Vascular calcifications. Possible old deformity at the distal fibula on lateral view. Dorsal degenerative change at the TMT joints. IMPRESSION: 1. Appearance of malalignment at the second and probably third TMT joints as may be seen with Lisfranc injury. Apparent heterogeneous sclerosis at the base of the second and probably third metatarsal bases suggesting chronic fracture Electronically Signed   By: Donavan Foil M.D.   On: 07/04/2022 20:28   CT Head Wo Contrast  Result Date: 07/04/2022 CLINICAL DATA:  Interval trauma with new right rib fractures. Possible falls. Altered mental status. EXAM: CT HEAD WITHOUT CONTRAST CT CERVICAL SPINE WITHOUT CONTRAST TECHNIQUE: Multidetector CT imaging of the head and cervical spine was performed following the standard protocol without intravenous contrast. Multiplanar CT image reconstructions of the cervical spine were also generated. RADIATION DOSE REDUCTION: This exam was performed  according to the departmental dose-optimization program which includes automated exposure control, adjustment of the mA and/or kV according to patient size and/or use of iterative reconstruction technique. COMPARISON:  06/24/2022 FINDINGS: CT HEAD FINDINGS Brain: Stable appearance of encephalomalacia from remote right frontal lobe infarct, no change or progression. Periventricular white matter and corona radiata hypodensities favor chronic ischemic microvascular white matter disease. Otherwise, the brainstem, cerebellum, cerebral peduncles, thalamus, basal ganglia, basilar cisterns, and ventricular system appear within normal limits. No intracranial hemorrhage, mass lesion, or acute CVA. Vascular: There is atherosclerotic calcification of the cavernous carotid arteries bilaterally. Skull: Unremarkable Sinuses/Orbits: Unremarkable Other: No supplemental non-categorized findings. CT CERVICAL SPINE FINDINGS Alignment: 3 mm chronic degenerative anterolisthesis at C2-3. Loss of the normal cervical lordosis, which can be associated with muscle spasm. Skull base and vertebrae: Fused left facet joint at C3-4 along with some probable mild interbody fusion this level. Reduced signal to noise ratio in the lower cervical spine due to body habitus. Stable spurring and possible small  erosions versus degenerative subcortical cysts at the anterior C1-2 articulation. Paragraphs there is loss of intervertebral disc height along with mild endplate irregularity at all levels between C2 and C7, as before, compatible with degenerative disc disease and degenerative endplate findings. No cervical spine fracture or acute bony findings identified. Soft tissues and spinal canal: Unremarkable Disc levels: Bilateral osseous foraminal stenosis at C3-4 due to uncinate spurring. There is likely mild left foraminal stenosis at C6-7 due to uncinate spurring. Upper chest: Lung apices appear clear. Other: Lucent lesion in the left first rib with  trabecular coarsening as shown on image 64 series 4. This had mixed but mostly high T2 signal on the MRI from 11/27/2021 and could well be a hemangioma but is not entirely technically specific. This lesion is fairly indistinct on radiography condom, manifesting only is faint speckled density on chest radiographs, and is difficult to corroborate on older chest radiograph studies. A lytic lesion of bone such as a metastatic lesion is considered less likely but not entirely excluded given the limited characterization of this lesion. There is no adjacent soft tissue mass. IMPRESSION: 1. No acute intracranial findings or acute cervical spine findings. 2. Stable encephalomalacia from remote right frontal lobe infarct. 3. Periventricular white matter and corona radiata hypodensities favor chronic ischemic microvascular white matter disease. 4. Lucent lesion in the left first rib with trabecular coarsening. This has mixed but mostly high T2 signal on the MRI from 11/27/2021 and could well be a hemangioma but is not entirely technically specific. No adjacent soft tissue mass. This might be further characterized with whole-body bone scan or MRI of the left upper chest with attention to the left first rib. 5. Atherosclerosis. 6. Cervical spondylosis and degenerative disc disease causing bilateral osseous foraminal stenosis at C3-4 and mild left foraminal stenosis at C6-7. Electronically Signed   By: Van Clines M.D.   On: 07/04/2022 18:05   CT Cervical Spine Wo Contrast  Result Date: 07/04/2022 CLINICAL DATA:  Interval trauma with new right rib fractures. Possible falls. Altered mental status. EXAM: CT HEAD WITHOUT CONTRAST CT CERVICAL SPINE WITHOUT CONTRAST TECHNIQUE: Multidetector CT imaging of the head and cervical spine was performed following the standard protocol without intravenous contrast. Multiplanar CT image reconstructions of the cervical spine were also generated. RADIATION DOSE REDUCTION: This exam was  performed according to the departmental dose-optimization program which includes automated exposure control, adjustment of the mA and/or kV according to patient size and/or use of iterative reconstruction technique. COMPARISON:  06/24/2022 FINDINGS: CT HEAD FINDINGS Brain: Stable appearance of encephalomalacia from remote right frontal lobe infarct, no change or progression. Periventricular white matter and corona radiata hypodensities favor chronic ischemic microvascular white matter disease. Otherwise, the brainstem, cerebellum, cerebral peduncles, thalamus, basal ganglia, basilar cisterns, and ventricular system appear within normal limits. No intracranial hemorrhage, mass lesion, or acute CVA. Vascular: There is atherosclerotic calcification of the cavernous carotid arteries bilaterally. Skull: Unremarkable Sinuses/Orbits: Unremarkable Other: No supplemental non-categorized findings. CT CERVICAL SPINE FINDINGS Alignment: 3 mm chronic degenerative anterolisthesis at C2-3. Loss of the normal cervical lordosis, which can be associated with muscle spasm. Skull base and vertebrae: Fused left facet joint at C3-4 along with some probable mild interbody fusion this level. Reduced signal to noise ratio in the lower cervical spine due to body habitus. Stable spurring and possible small erosions versus degenerative subcortical cysts at the anterior C1-2 articulation. Paragraphs there is loss of intervertebral disc height along with mild endplate irregularity at all levels between C2  and C7, as before, compatible with degenerative disc disease and degenerative endplate findings. No cervical spine fracture or acute bony findings identified. Soft tissues and spinal canal: Unremarkable Disc levels: Bilateral osseous foraminal stenosis at C3-4 due to uncinate spurring. There is likely mild left foraminal stenosis at C6-7 due to uncinate spurring. Upper chest: Lung apices appear clear. Other: Lucent lesion in the left first rib  with trabecular coarsening as shown on image 64 series 4. This had mixed but mostly high T2 signal on the MRI from 11/27/2021 and could well be a hemangioma but is not entirely technically specific. This lesion is fairly indistinct on radiography condom, manifesting only is faint speckled density on chest radiographs, and is difficult to corroborate on older chest radiograph studies. A lytic lesion of bone such as a metastatic lesion is considered less likely but not entirely excluded given the limited characterization of this lesion. There is no adjacent soft tissue mass. IMPRESSION: 1. No acute intracranial findings or acute cervical spine findings. 2. Stable encephalomalacia from remote right frontal lobe infarct. 3. Periventricular white matter and corona radiata hypodensities favor chronic ischemic microvascular white matter disease. 4. Lucent lesion in the left first rib with trabecular coarsening. This has mixed but mostly high T2 signal on the MRI from 11/27/2021 and could well be a hemangioma but is not entirely technically specific. No adjacent soft tissue mass. This might be further characterized with whole-body bone scan or MRI of the left upper chest with attention to the left first rib. 5. Atherosclerosis. 6. Cervical spondylosis and degenerative disc disease causing bilateral osseous foraminal stenosis at C3-4 and mild left foraminal stenosis at C6-7. Electronically Signed   By: Van Clines M.D.   On: 07/04/2022 18:05   CT CHEST ABDOMEN PELVIS WO CONTRAST  Result Date: 07/04/2022 CLINICAL DATA:  Altered behavior, possible medication overdose. Anti coagulation. Fall on 06/24/2022. Dialysis this morning. New rib fractures on chest radiography today. EXAM: CT CHEST, ABDOMEN AND PELVIS WITHOUT CONTRAST TECHNIQUE: Multidetector CT imaging of the chest, abdomen and pelvis was performed following the standard protocol without IV contrast. RADIATION DOSE REDUCTION: This exam was performed according  to the departmental dose-optimization program which includes automated exposure control, adjustment of the mA and/or kV according to patient size and/or use of iterative reconstruction technique. COMPARISON:  Multiple exams, including 06/24/2022 and 04/23/2021 FINDINGS: CT CHEST FINDINGS Cardiovascular: Atherosclerotic calcification of the thoracic aorta and coronary arteries along with cardiomegaly. Substantially enlarged but stable main pulmonary artery and right atrium. There are some pericardial and epicardial calcifications along with some venous calcifications probably related to prior indwelling catheters. Mediastinum/Nodes: No pathologic adenopathy. Lungs/Pleura: Mildly improved atelectasis in the right lower lobe compared to the 06/24/2022 exam. Peribronchovascular reticulonodular and tree-in-bud opacities in the left upper lobe for example on image 54 series 4, likely from atypical infectious process. Continued substantial volume loss and some consolidation in the left lower lobe, roughly similar to prior, encompassing most of the left lower lobe. Musculoskeletal: There are a variety of newly visible right rib fractures compared to the prior exam. These include fractures of the right third, fourth, fifth, sixth, and seventh ribs. The fourth, fifth, sixth, and seventh rib fractures are segmental, with the fourth, fifth, and sixth rib fractures being multi segmental (fractures in 3 locations in each rib). However, for the most part the rib fractures are nondisplaced or only minimally displaced. No thoracic spine fracture is identified. CT ABDOMEN PELVIS FINDINGS Hepatobiliary: Unremarkable.  Contracted gallbladder. Pancreas: Unremarkable Spleen:  Unremarkable Adrenals/Urinary Tract: Innumerable cysts of varying complexity throughout both kidneys compatible with autosomal dominant polycystic kidney disease, similar distribution to prior. Adrenal glands unremarkable. Urinary bladder empty. Small punctate  calcification along the renal collecting systems likely mostly vascular although a component of nonobstructive renal calculi are difficult to exclude particularly on the left side. Stomach/Bowel: Prior sleeve gastrectomy. Prominent stool throughout the colon favors constipation. No dilated small bowel. Normal appendix. Vascular/Lymphatic: Atherosclerosis is present, including aortoiliac atherosclerotic disease. No pathologic adenopathy. Reproductive: Fiducials along the prostate gland. Other: No supplemental non-categorized findings. Musculoskeletal: Right total hip prosthesis. Lower lumbar degenerative facet arthropathy probably with facet joint effusions at L4-5 bilaterally. Umbilical hernia contains adipose tissue. Moderate degenerative arthropathy of the left hip. IMPRESSION: 1. There are a variety of newly visible right rib fractures compared to the prior exam. These include fractures of the right third, fourth, fifth, sixth, and seventh ribs. The fourth, fifth, and sixth rib fractures are segmental, with the fourth, fifth, and sixth rib fractures being multi segmental. However, for the most part the rib fractures are nondisplaced or only minimally displaced. No pneumothorax or substantial pleural effusion. 2. Continued substantial volume loss and some consolidation in the left lower lobe, roughly similar to prior. Much of this is chronic back through 2016. 3. Peribronchovascular reticulonodular and tree-in-bud opacities in the left upper lobe, likely from atypical infectious process. 4. Cardiomegaly with considerable enlargement of the main pulmonary artery and right atrium, suggesting pulmonary arterial hypertension. 5. Prominent stool throughout the colon favors constipation. 6. Autosomal dominant polycystic kidney disease. Cannot exclude punctate nonobstructive left nephrolithiasis. 7. Umbilical hernia contains adipose tissue. 8. Moderate degenerative arthropathy of the left hip. 9. Lower lumbar  degenerative facet arthropathy probably with facet joint effusions at L4-5 bilaterally. 10. Aortic atherosclerosis. Aortic Atherosclerosis (ICD10-I70.0). Electronically Signed   By: Van Clines M.D.   On: 07/04/2022 17:46   DG Chest Portable 1 View  Result Date: 07/04/2022 CLINICAL DATA:  Altered mental status.  Right-sided chest pain. EXAM: PORTABLE CHEST 1 VIEW COMPARISON:  11/09/2018 radiography.  Chest CT 06/24/2022 FINDINGS: Previous median sternotomy. Cardiomegaly. Tortuous aorta. The patient has taken a poor inspiration. Allowing for that, there may be mild volume loss at both lung bases. Upper lobes appear clear. No visible effusion. There are multiple rib fractures on the right which are new since the chest CT of 10 days ago, including ribs 2 through 7. No pneumothorax. IMPRESSION: 1. Multiple rib fractures on the right which are new since the chest CT of 10 days ago. No pneumothorax. 2. Poor inspiration. Allowing for that, there may be mild volume loss at both lung bases. Electronically Signed   By: Nelson Chimes M.D.   On: 07/04/2022 15:49   Medications:   acetaminophen  650 mg Oral Q6H   Or   acetaminophen  650 mg Rectal Q6H   apixaban  2.5 mg Oral BID   Chlorhexidine Gluconate Cloth  6 each Topical Q0600   citalopram  20 mg Oral Daily   cycloSPORINE  175 mg Oral BID   feeding supplement (NEPRO CARB STEADY)  237 mL Oral BID BM   guaiFENesin  600 mg Oral BID   insulin aspart  0-6 Units Subcutaneous TID WC   lidocaine  1 patch Transdermal Q24H   midodrine  10 mg Oral TID   mycophenolate  720 mg Oral BID   rosuvastatin  10 mg Oral Daily   sevelamer carbonate  1,600 mg Oral TID WC   sodium chloride  flush  3 mL Intravenous Q12H

## 2022-07-06 NOTE — TOC CAGE-AID Note (Signed)
Transition of Care [facility]) - CAGE-AID Screening   Patient Details  Name: [name] MRN: [id number] Date of Birth: 01-Sep-1960  Transition of Care [facility]) CM/SW Contact:    Clovis Cao, RN Phone Number: [phone]   Clinical Narrative: Pt here with acute encephalopathy.  Pt denies alcohol or drug use.  Screening complete.   CAGE-AID Screening:    Have You Ever Felt You Ought to Cut Down on Your Drinking or Drug Use?: No Have People Annoyed You By Critizing Your Drinking Or Drug Use?: No Have You Felt Bad Or Guilty About Your Drinking Or Drug Use?: No Have You Ever Had a Drink or Used Drugs First Thing In The Morning to Steady Your Nerves or to Get Rid of a Hangover?: No CAGE-AID Score: 0  Substance Abuse Education Offered: No

## 2022-07-06 NOTE — Progress Notes (Signed)
DISCHARGE NOTE HOME GRANVIL DJORDJEVIC to be discharged Home per MD order. Diagnosis, treatment,medication changes , and follow up appointments discussed with the patient and wife Coleston Dirosa who verbalized knowledge and understanding. Bedside commode delivered and given to wife on discharge.Medication list explained in detail.  Skin clean, dry and intact without evidence of skin break down, no evidence of skin tears noted. IV catheter discontinued intact. Site without signs and symptoms of complications. Dressing and pressure applied. Pt denies pain at the site currently. No complaints noted. Fistula covered with dressing dry and intact.   Patient free of lines, drains, and wounds.   An After Visit Summary (AVS) was printed and given to the patient. Patient escorted via wheelchair, and discharged home via private auto.  Virgina Jock, RN

## 2022-07-06 NOTE — Progress Notes (Signed)
    Durable Medical Equipment  (From admission, onward)           Start     Ordered   07/06/22 1108  For home use only DME Bedside commode  Once       Comments: Patient will be at a level with no toilet facility.  Question:  Patient needs a bedside commode to treat with the following condition  Answer:  Gait difficulty   07/06/22 1112

## 2022-07-06 NOTE — Discharge Summary (Signed)
Physician Discharge Summary  [name] LYY:[id number] DOB: May 03, 1961 DOA: 07/04/2022  PCP: [name] Ada, MD  Admit date: 07/04/2022 Discharge date: 07/06/2022  Time spent: 36 minutes  Recommendations for Outpatient Follow-up:  Note dosage changes of medications including de-escalation of opioids as well as cutting back dosing of Lyrica Obtain renal panel and other studies in the outpatient setting in about 1 week She will be tested with an A1c in the outpatient setting was receiving sliding scale coverage.  Discharge Diagnoses:  MAIN problem for hospitalization   Toxic metabolic encephalopathy secondary to medications such as opiates ESRD on HD MWF [facility]/2014 on immunosuppressants Prior CVA Prior ischemic cardiomyopathy OSA not on CPAP  Please see below for itemized issues addressed in HOps[facility]- refer to other progress notes for clarity if needed  Discharge Condition: Improved  Diet recommendation: Renal  Filed Weights   07/04/22 1620 07/06/22 0739  Weight: (!) 142.9 kg (!) 142.2 kg    History of present illness:  60 year old black male OSA no CPAP ESRD on HD MWF [facility]-i not a transplant candidate Prostate cancer CAD with heart transplant 2014 currently on Myfortic and cyclosporine Significant weight loss status post gastric banding 01/2017 CVA with right cortical/subcortical brain infarcts 2/2 A-fib, ischemic cardiomyopathy with EF 10 to 15% previously--subsequent possible stroke 11/11/2018 but felt more to be toxic metabolic encephalopathy   Was seen in ED 06/24/2022 with a fall and was found to have acute fracture of fifth rib but no other injury and was sent home   Review presented to emergency room 11/8 with abnormal behavior--this is in the setting of taking oxycodone-he was confused and was not able to give history in the ED Work-up revealed chloride 94 BUN/creatinine 31/8.6 bilirubin 1.8 WBC 13.7 hemoglobin 9.2  platelet 185   RSV tested positive COVID-negative   Imaging showed postsurgical changes second and fourth digits left foot Left first rib showed trabecular coarsening?  Hemangioma Encephalomalacia found in right frontal lobe cervical spondylosis noted CXR = multiple rib fractures on the right that are new no pneumothorax poor inspiration  Hospital Course:  Toxic metabolic likely secondary to polypharmacy in the setting of ESRD MWF -Etiology most likely secondary to increased use of oxycodone - Mental status cleared completely at time of discharge the new time place person - Try to cut back/avoid narcotics, trauma has seen the patient and they signed off as no pneumothorax - Oxycodone downward adjusted to 5 mg discontinued all IV,  Lyrica doses and held Trileptal as well as Robaxin--- on discharge we resumed lower dosing of Lyrica but discontinued Robaxin-see below   Fall with multiple rib fractures, prior fall on 10/29 with fifth rib fractures - PT evaluated patient recommended skilled however patient wishes to go home -Has chronic right foot fractures since 05/07/2022 he is wearing a cam walker and this can be followed by [facility]   RSV positive - No symptoms no cough cold -Symptomatic management with mucolytic's if needed   ESRD not kidney transplant candidate HD MWF Secondary hyperparathyroidism anemia renal disease - Labs seem appropriate, hemoglobin is stable - Slightly elevated phosphorus can be addressed as an outpatient-continues on midodrine 10 3 times daily to support dialysis, Renvela 1600 mg 3 times daily - Consult nephrology today for further dialysis needs   Prior heart transplant 2014 History of NICM-last echo EF 60-65% - Resumed Myfortic 720 twice daily, cyclosporine 175 twice daily - Not a candidate for GDMT given hypotension   Depression - Continue  Celexa 20 daily, patient is not on Trileptal anymore and I have discontinued this completely from his [facility] after  confirming this with him     Discharge Exam: Vitals:   07/06/22 0759 07/06/22 0830  BP: 90/62 (!) 111/53  Pulse: 78 78  Resp: 13 13  Temp:    SpO2: 93% 95%    Subj on day of d/c   Seen on HD unit looks well no fever no chills no issues  General Exam on discharge  EOMI NCAT no focal deficit thick neck Mallampati 2 No icterus no pallor Chest is clear no added sound rales rhonchi No asterixis Power is 5/5 CAM Walker not on today  Discharge Instructions   Discharge Instructions     Diet - low sodium heart healthy   Complete by: As directed    Discharge instructions   Complete by: As directed    Look at your medications carefully--make sure that you take oxycodone only when your pain is above 7/10 use Tylenol otherwise-I have simplified some of your meds as you have noticed and cut back your Lyrica to only once a day--- if you experience confusion or other problems please seek medical assistance however you should be on less medications  We will get therapy to come out to your home and help you with mobility given you have an area in your right leg that needs therapy and work-I would recommend that you mobilize slowly but surely at home with therapy to get stronger   Increase activity slowly   Complete by: As directed       Allergies as of 07/06/2022       Reactions   Heparin Nausea Only, Swelling, Other (See Comments)   * * HIT * * SWELLING REACTION UNSPECIFIED  DIAPHORESIS   Iodinated Contrast Media    Penicillin G Potassium [penicillin G] Nausea Only, Other (See Comments)   DIAPHORESIS        Medication List     STOP taking these medications    OXcarbazepine 150 MG tablet Commonly known as: Trileptal       TAKE these medications    acetaminophen 325 MG tablet Commonly known as: TYLENOL Take 2 tablets (650 mg total) by mouth every 6 (six) hours.   cinacalcet 60 MG tablet Commonly known as: SENSIPAR Take 60 mg by mouth daily.   citalopram 20  MG tablet Commonly known as: CELEXA Take 20 mg by mouth daily.   cycloSPORINE modified 25 MG capsule Commonly known as: NEORAL Take 75 mg by mouth See admin instructions. Take With 100 mg tab for a total of 175 mg twice a day   cycloSPORINE modified 100 MG capsule Commonly known as: NEORAL Take 100 mg by mouth See admin instructions. Take with three 25 mg  (75 ) for a total of 175 mg twice a day   Eliquis 2.5 MG Tabs tablet Generic drug: apixaban Take 1 tablet (2.5 mg total) by mouth 2 (two) times daily.   lidocaine 5 % ointment Commonly known as: XYLOCAINE Apply 1 application topically as needed.   lidocaine 5 % Commonly known as: Lidoderm Place 1 patch onto the skin daily. Remove & Discard patch within 12 hours or as directed by MD   lidocaine-prilocaine cream Commonly known as: EMLA Apply 1 application topically as needed.   methocarbamol 500 MG tablet Commonly known as: ROBAXIN Take 1 tablet (500 mg total) by mouth every 8 (eight) hours as needed for muscle spasms.   midodrine 10  MG tablet Commonly known as: PROAMATINE Take 1 tablet (10 mg total) by mouth 3 (three) times daily.   mycophenolate 180 MG EC tablet Commonly known as: MYFORTIC Take 720 mg by mouth 2 (two) times daily.   ondansetron 4 MG tablet Commonly known as: ZOFRAN Take 1 tablet (4 mg total) by mouth every 6 (six) hours as needed for nausea.   oxybutynin 5 MG tablet Commonly known as: DITROPAN Take 1 tablet (5 mg total) by mouth every 8 (eight) hours as needed for bladder spasms.   oxyCODONE 5 MG immediate release tablet Commonly known as: Oxy IR/ROXICODONE Take 1 tablet (5 mg total) by mouth every 4 (four) hours as needed for moderate pain or severe pain ('5mg'$  moderate, '10mg'$  severe).   pregabalin 300 MG capsule Commonly known as: LYRICA Take 1 capsule (300 mg total) by mouth daily. What changed:  when to take this Another medication with the same name was removed. Continue taking this  medication, and follow the directions you see here.   rosuvastatin 10 MG tablet Commonly known as: CRESTOR Take 1 tablet (10 mg total) by mouth daily.   sevelamer carbonate 800 MG tablet Commonly known as: RENVELA Take 2 tablets (1,600 mg total) by mouth 3 (three) times daily with meals.       Allergies  Allergen Reactions   Heparin Nausea Only, Swelling and Other (See Comments)    * * HIT * *  SWELLING REACTION UNSPECIFIED   DIAPHORESIS    Iodinated Contrast Media    Penicillin G Potassium [Penicillin G] Nausea Only and Other (See Comments)    DIAPHORESIS      The results of significant diagnostics from this hospitalization (including imaging, microbiology, ancillary and laboratory) are listed below for reference.    Significant Diagnostic Studies: DG CHEST PORT 1 VIEW  Result Date: 07/05/2022 CLINICAL DATA:  Right rib fractures. EXAM: PORTABLE CHEST 1 VIEW COMPARISON:  Same day. FINDINGS: Stable cardiomegaly. Sternotomy wires are noted. Mild bibasilar subsegmental atelectasis is noted. Multiple right rib fractures are again noted. No definite pneumothorax is noted. IMPRESSION: Mild bibasilar subsegmental atelectasis. Multiple right rib fractures are again noted. Electronically Signed   By: Marijo Conception M.D.   On: 07/05/2022 08:21   DG Foot 2 Views Left  Result Date: 07/04/2022 CLINICAL DATA:  Fall EXAM: LEFT FOOT - 2 VIEW COMPARISON:  12/23/2017 FINDINGS: Vascular calcifications. Removal of previously noted surgical pin from the second digit. Fixating screw across the fourth PIP joint with osseous fusion. Defect base of the second distal phalanx with smooth sclerotic margin suggesting chronic deformity. There appears to be resection of the second middle phalanx and the distal aspect of the second proximal phalanx. IMPRESSION: 1. No definitive acute osseous abnormality. 2. Postsurgical changes of the second and fourth digits. Chronic appearing deformity at the second distal  removal of previously noted fixating pin in the second digit with apparent interval resection of the distal aspect second proximal phalanx as well as the middle phalanx of second digit. Chronic appearing defect base of the second distal phalanx. Electronically Signed   By: Donavan Foil M.D.   On: 07/04/2022 20:31   DG Foot 2 Views Right  Result Date: 07/04/2022 CLINICAL DATA:  Fall EXAM: RIGHT FOOT - 2 VIEW COMPARISON:  None Available. FINDINGS: Bones appear demineralized. Mild degenerative change at the first MTP joint. Malalignment at the second TMT joint with lateral subluxation of the base of the metatarsal. Heterogeneous sclerosis at the base of the second  and probably third metatarsals but slightly limited assessment due to positioning and osseous superimposition. Vascular calcifications. Possible old deformity at the distal fibula on lateral view. Dorsal degenerative change at the TMT joints. IMPRESSION: 1. Appearance of malalignment at the second and probably third TMT joints as may be seen with Lisfranc injury. Apparent heterogeneous sclerosis at the base of the second and probably third metatarsal bases suggesting chronic fracture Electronically Signed   By: Donavan Foil M.D.   On: 07/04/2022 20:28   CT Head Wo Contrast  Result Date: 07/04/2022 CLINICAL DATA:  Interval trauma with new right rib fractures. Possible falls. Altered mental status. EXAM: CT HEAD WITHOUT CONTRAST CT CERVICAL SPINE WITHOUT CONTRAST TECHNIQUE: Multidetector CT imaging of the head and cervical spine was performed following the standard protocol without intravenous contrast. Multiplanar CT image reconstructions of the cervical spine were also generated. RADIATION DOSE REDUCTION: This exam was performed according to the departmental dose-optimization program which includes automated exposure control, adjustment of the mA and/or kV according to patient size and/or use of iterative reconstruction technique. COMPARISON:   06/24/2022 FINDINGS: CT HEAD FINDINGS Brain: Stable appearance of encephalomalacia from remote right frontal lobe infarct, no change or progression. Periventricular white matter and corona radiata hypodensities favor chronic ischemic microvascular white matter disease. Otherwise, the brainstem, cerebellum, cerebral peduncles, thalamus, basal ganglia, basilar cisterns, and ventricular system appear within normal limits. No intracranial hemorrhage, mass lesion, or acute CVA. Vascular: There is atherosclerotic calcification of the cavernous carotid arteries bilaterally. Skull: Unremarkable Sinuses/Orbits: Unremarkable Other: No supplemental non-categorized findings. CT CERVICAL SPINE FINDINGS Alignment: 3 mm chronic degenerative anterolisthesis at C2-3. Loss of the normal cervical lordosis, which can be associated with muscle spasm. Skull base and vertebrae: Fused left facet joint at C3-4 along with some probable mild interbody fusion this level. Reduced signal to noise ratio in the lower cervical spine due to body habitus. Stable spurring and possible small erosions versus degenerative subcortical cysts at the anterior C1-2 articulation. Paragraphs there is loss of intervertebral disc height along with mild endplate irregularity at all levels between C2 and C7, as before, compatible with degenerative disc disease and degenerative endplate findings. No cervical spine fracture or acute bony findings identified. Soft tissues and spinal canal: Unremarkable Disc levels: Bilateral osseous foraminal stenosis at C3-4 due to uncinate spurring. There is likely mild left foraminal stenosis at C6-7 due to uncinate spurring. Upper chest: Lung apices appear clear. Other: Lucent lesion in the left first rib with trabecular coarsening as shown on image 64 series 4. This had mixed but mostly high T2 signal on the MRI from 11/27/2021 and could well be a hemangioma but is not entirely technically specific. This lesion is fairly  indistinct on radiography condom, manifesting only is faint speckled density on chest radiographs, and is difficult to corroborate on older chest radiograph studies. A lytic lesion of bone such as a metastatic lesion is considered less likely but not entirely excluded given the limited characterization of this lesion. There is no adjacent soft tissue mass. IMPRESSION: 1. No acute intracranial findings or acute cervical spine findings. 2. Stable encephalomalacia from remote right frontal lobe infarct. 3. Periventricular white matter and corona radiata hypodensities favor chronic ischemic microvascular white matter disease. 4. Lucent lesion in the left first rib with trabecular coarsening. This has mixed but mostly high T2 signal on the MRI from 11/27/2021 and could well be a hemangioma but is not entirely technically specific. No adjacent soft tissue mass. This might be further characterized  with whole-body bone scan or MRI of the left upper chest with attention to the left first rib. 5. Atherosclerosis. 6. Cervical spondylosis and degenerative disc disease causing bilateral osseous foraminal stenosis at C3-4 and mild left foraminal stenosis at C6-7. Electronically Signed   By: Van Clines M.D.   On: 07/04/2022 18:05   CT Cervical Spine Wo Contrast  Result Date: 07/04/2022 CLINICAL DATA:  Interval trauma with new right rib fractures. Possible falls. Altered mental status. EXAM: CT HEAD WITHOUT CONTRAST CT CERVICAL SPINE WITHOUT CONTRAST TECHNIQUE: Multidetector CT imaging of the head and cervical spine was performed following the standard protocol without intravenous contrast. Multiplanar CT image reconstructions of the cervical spine were also generated. RADIATION DOSE REDUCTION: This exam was performed according to the departmental dose-optimization program which includes automated exposure control, adjustment of the mA and/or kV according to patient size and/or use of iterative reconstruction technique.  COMPARISON:  06/24/2022 FINDINGS: CT HEAD FINDINGS Brain: Stable appearance of encephalomalacia from remote right frontal lobe infarct, no change or progression. Periventricular white matter and corona radiata hypodensities favor chronic ischemic microvascular white matter disease. Otherwise, the brainstem, cerebellum, cerebral peduncles, thalamus, basal ganglia, basilar cisterns, and ventricular system appear within normal limits. No intracranial hemorrhage, mass lesion, or acute CVA. Vascular: There is atherosclerotic calcification of the cavernous carotid arteries bilaterally. Skull: Unremarkable Sinuses/Orbits: Unremarkable Other: No supplemental non-categorized findings. CT CERVICAL SPINE FINDINGS Alignment: 3 mm chronic degenerative anterolisthesis at C2-3. Loss of the normal cervical lordosis, which can be associated with muscle spasm. Skull base and vertebrae: Fused left facet joint at C3-4 along with some probable mild interbody fusion this level. Reduced signal to noise ratio in the lower cervical spine due to body habitus. Stable spurring and possible small erosions versus degenerative subcortical cysts at the anterior C1-2 articulation. Paragraphs there is loss of intervertebral disc height along with mild endplate irregularity at all levels between C2 and C7, as before, compatible with degenerative disc disease and degenerative endplate findings. No cervical spine fracture or acute bony findings identified. Soft tissues and spinal canal: Unremarkable Disc levels: Bilateral osseous foraminal stenosis at C3-4 due to uncinate spurring. There is likely mild left foraminal stenosis at C6-7 due to uncinate spurring. Upper chest: Lung apices appear clear. Other: Lucent lesion in the left first rib with trabecular coarsening as shown on image 64 series 4. This had mixed but mostly high T2 signal on the MRI from 11/27/2021 and could well be a hemangioma but is not entirely technically specific. This lesion is  fairly indistinct on radiography condom, manifesting only is faint speckled density on chest radiographs, and is difficult to corroborate on older chest radiograph studies. A lytic lesion of bone such as a metastatic lesion is considered less likely but not entirely excluded given the limited characterization of this lesion. There is no adjacent soft tissue mass. IMPRESSION: 1. No acute intracranial findings or acute cervical spine findings. 2. Stable encephalomalacia from remote right frontal lobe infarct. 3. Periventricular white matter and corona radiata hypodensities favor chronic ischemic microvascular white matter disease. 4. Lucent lesion in the left first rib with trabecular coarsening. This has mixed but mostly high T2 signal on the MRI from 11/27/2021 and could well be a hemangioma but is not entirely technically specific. No adjacent soft tissue mass. This might be further characterized with whole-body bone scan or MRI of the left upper chest with attention to the left first rib. 5. Atherosclerosis. 6. Cervical spondylosis and degenerative disc disease causing  bilateral osseous foraminal stenosis at C3-4 and mild left foraminal stenosis at C6-7. Electronically Signed   By: Van Clines M.D.   On: 07/04/2022 18:05   CT CHEST ABDOMEN PELVIS WO CONTRAST  Result Date: 07/04/2022 CLINICAL DATA:  Altered behavior, possible medication overdose. Anti coagulation. Fall on 06/24/2022. Dialysis this morning. New rib fractures on chest radiography today. EXAM: CT CHEST, ABDOMEN AND PELVIS WITHOUT CONTRAST TECHNIQUE: Multidetector CT imaging of the chest, abdomen and pelvis was performed following the standard protocol without IV contrast. RADIATION DOSE REDUCTION: This exam was performed according to the departmental dose-optimization program which includes automated exposure control, adjustment of the mA and/or kV according to patient size and/or use of iterative reconstruction technique. COMPARISON:   Multiple exams, including 06/24/2022 and 04/23/2021 FINDINGS: CT CHEST FINDINGS Cardiovascular: Atherosclerotic calcification of the thoracic aorta and coronary arteries along with cardiomegaly. Substantially enlarged but stable main pulmonary artery and right atrium. There are some pericardial and epicardial calcifications along with some venous calcifications probably related to prior indwelling catheters. Mediastinum/Nodes: No pathologic adenopathy. Lungs/Pleura: Mildly improved atelectasis in the right lower lobe compared to the 06/24/2022 exam. Peribronchovascular reticulonodular and tree-in-bud opacities in the left upper lobe for example on image 54 series 4, likely from atypical infectious process. Continued substantial volume loss and some consolidation in the left lower lobe, roughly similar to prior, encompassing most of the left lower lobe. Musculoskeletal: There are a variety of newly visible right rib fractures compared to the prior exam. These include fractures of the right third, fourth, fifth, sixth, and seventh ribs. The fourth, fifth, sixth, and seventh rib fractures are segmental, with the fourth, fifth, and sixth rib fractures being multi segmental (fractures in 3 locations in each rib). However, for the most part the rib fractures are nondisplaced or only minimally displaced. No thoracic spine fracture is identified. CT ABDOMEN PELVIS FINDINGS Hepatobiliary: Unremarkable.  Contracted gallbladder. Pancreas: Unremarkable Spleen: Unremarkable Adrenals/Urinary Tract: Innumerable cysts of varying complexity throughout both kidneys compatible with autosomal dominant polycystic kidney disease, similar distribution to prior. Adrenal glands unremarkable. Urinary bladder empty. Small punctate calcification along the renal collecting systems likely mostly vascular although a component of nonobstructive renal calculi are difficult to exclude particularly on the left side. Stomach/Bowel: Prior sleeve  gastrectomy. Prominent stool throughout the colon favors constipation. No dilated small bowel. Normal appendix. Vascular/Lymphatic: Atherosclerosis is present, including aortoiliac atherosclerotic disease. No pathologic adenopathy. Reproductive: Fiducials along the prostate gland. Other: No supplemental non-categorized findings. Musculoskeletal: Right total hip prosthesis. Lower lumbar degenerative facet arthropathy probably with facet joint effusions at L4-5 bilaterally. Umbilical hernia contains adipose tissue. Moderate degenerative arthropathy of the left hip. IMPRESSION: 1. There are a variety of newly visible right rib fractures compared to the prior exam. These include fractures of the right third, fourth, fifth, sixth, and seventh ribs. The fourth, fifth, and sixth rib fractures are segmental, with the fourth, fifth, and sixth rib fractures being multi segmental. However, for the most part the rib fractures are nondisplaced or only minimally displaced. No pneumothorax or substantial pleural effusion. 2. Continued substantial volume loss and some consolidation in the left lower lobe, roughly similar to prior. Much of this is chronic back through 2016. 3. Peribronchovascular reticulonodular and tree-in-bud opacities in the left upper lobe, likely from atypical infectious process. 4. Cardiomegaly with considerable enlargement of the main pulmonary artery and right atrium, suggesting pulmonary arterial hypertension. 5. Prominent stool throughout the colon favors constipation. 6. Autosomal dominant polycystic kidney disease. Cannot exclude punctate nonobstructive  left nephrolithiasis. 7. Umbilical hernia contains adipose tissue. 8. Moderate degenerative arthropathy of the left hip. 9. Lower lumbar degenerative facet arthropathy probably with facet joint effusions at L4-5 bilaterally. 10. Aortic atherosclerosis. Aortic Atherosclerosis (ICD10-I70.0). Electronically Signed   By: Van Clines M.D.   On:  07/04/2022 17:46   DG Chest Portable 1 View  Result Date: 07/04/2022 CLINICAL DATA:  Altered mental status.  Right-sided chest pain. EXAM: PORTABLE CHEST 1 VIEW COMPARISON:  11/09/2018 radiography.  Chest CT 06/24/2022 FINDINGS: Previous median sternotomy. Cardiomegaly. Tortuous aorta. The patient has taken a poor inspiration. Allowing for that, there may be mild volume loss at both lung bases. Upper lobes appear clear. No visible effusion. There are multiple rib fractures on the right which are new since the chest CT of 10 days ago, including ribs 2 through 7. No pneumothorax. IMPRESSION: 1. Multiple rib fractures on the right which are new since the chest CT of 10 days ago. No pneumothorax. 2. Poor inspiration. Allowing for that, there may be mild volume loss at both lung bases. Electronically Signed   By: Nelson Chimes M.D.   On: 07/04/2022 15:49   CT Chest Wo Contrast  Result Date: 06/24/2022 CLINICAL DATA:  Chest pain after a fall. EXAM: CT CHEST WITHOUT CONTRAST TECHNIQUE: Multidetector CT imaging of the chest was performed following the standard protocol without IV contrast. RADIATION DOSE REDUCTION: This exam was performed according to the departmental dose-optimization program which includes automated exposure control, adjustment of the mA and/or kV according to patient size and/or use of iterative reconstruction technique. COMPARISON:  Chest radiograph dated 11/09/2018 and CT abdomen pelvis dated 04/23/2021. FINDINGS: Cardiovascular: The right and left pulmonary arteries are enlarged, measuring 4.1 5 cm respectively, suggestive of pulmonary hypertension. Vascular calcifications are seen in the coronary arteries and aortic arch. The heart is mildly enlarged. No pericardial effusion. Mediastinum/Nodes: No enlarged mediastinal or axillary lymph nodes. Thyroid gland, trachea, and esophagus demonstrate no significant findings. Lungs/Pleura: There is moderate bilateral lower lobe atelectasis, mild right  middle lobe atelectasis, and mild bilateral upper lobe atelectasis. No pleural effusion or pneumothorax Upper Abdomen: Polycystic kidneys are partially imaged. The patient is status post sleeve gastrectomy. Musculoskeletal: There is an acute fracture of the anterior right fifth rib. Median sternotomy wires are noted. IMPRESSION: 1. Acute fracture of the anterior right fifth rib. 2. Moderate bilateral atelectasis. 3. Enlarged right and left pulmonary arteries, suggestive of pulmonary hypertension. Aortic Atherosclerosis (ICD10-I70.0). Electronically Signed   By: Zerita Boers M.D.   On: 06/24/2022 21:07   CT T-SPINE NO CHARGE  Result Date: 06/24/2022 CLINICAL DATA:  Fall with back pain. EXAM: CT THORACIC SPINE WITHOUT CONTRAST TECHNIQUE: Multidetector CT images of the thoracic were obtained using the standard protocol without intravenous contrast. RADIATION DOSE REDUCTION: This exam was performed according to the departmental dose-optimization program which includes automated exposure control, adjustment of the mA and/or kV according to patient size and/or use of iterative reconstruction technique. COMPARISON:  Same day chest CT. FINDINGS: Alignment: Normal. Vertebrae: No acute fracture or focal pathologic process. Paraspinal and other soft tissues: No acute finding. Disc levels: Mild multilevel degenerative disc and joint disease. IMPRESSION: No acute osseous injury in the thoracic spine. Electronically Signed   By: Zerita Boers M.D.   On: 06/24/2022 21:01   CT Cervical Spine Wo Contrast  Result Date: 06/24/2022 CLINICAL DATA:  Neck pain after a fall. EXAM: CT CERVICAL SPINE WITHOUT CONTRAST TECHNIQUE: Multidetector CT imaging of the cervical spine was performed without  intravenous contrast. Multiplanar CT image reconstructions were also generated. RADIATION DOSE REDUCTION: This exam was performed according to the departmental dose-optimization program which includes automated exposure control, adjustment  of the mA and/or kV according to patient size and/or use of iterative reconstruction technique. COMPARISON:  MR cervical spine dated 11/27/2021 FINDINGS: Alignment: There is 3 mm anterolisthesis of C2 on C3, unchanged. No traumatic listhesis is identified. Skull base and vertebrae: No acute fracture. No primary bone lesion or focal pathologic process. Soft tissues and spinal canal: No prevertebral fluid or swelling. No visible canal hematoma. Disc levels: Up to severe multilevel degenerative disc and joint disease. Upper chest: Negative. Other: None. IMPRESSION: No acute osseous injury in the cervical spine. Electronically Signed   By: Zerita Boers M.D.   On: 06/24/2022 20:56   CT Head Wo Contrast  Result Date: 06/24/2022 CLINICAL DATA:  Head trauma, intracranial venous injury suspected. Fall EXAM: CT HEAD WITHOUT CONTRAST TECHNIQUE: Contiguous axial images were obtained from the base of the skull through the vertex without intravenous contrast. RADIATION DOSE REDUCTION: This exam was performed according to the departmental dose-optimization program which includes automated exposure control, adjustment of the mA and/or kV according to patient size and/or use of iterative reconstruction technique. COMPARISON:  11/09/2018 FINDINGS: Brain: Chronic right frontoparietal infarct, similar to prior study. Mild volume loss. No acute intracranial abnormality. Specifically, no hemorrhage, hydrocephalus, mass lesion, acute infarction, or significant intracranial injury. Vascular: No hyperdense vessel or unexpected calcification. Skull: No acute calvarial abnormality. 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 No acute intracranial abnormality. Electronically Signed   By: Rolm Baptise M.D.   On: 06/24/2022 19:32   DG Shoulder Right Port  Result Date: 06/24/2022 CLINICAL DATA:  Fall, shoulder pain EXAM: RIGHT SHOULDER - 1 VIEW COMPARISON:  None Available. FINDINGS:  Degenerative changes in the [facility] joint with joint space narrowing and spurring. Glenohumeral joint is maintained. No acute bony abnormality. Specifically, no fracture, subluxation, or dislocation. Soft tissues are intact. IMPRESSION: Degenerative changes in the right AC joint. No acute bony abnormality. Electronically Signed   By: Rolm Baptise M.D.   On: 06/24/2022 19:31    Microbiology: Recent Results (from the past 240 hour(s))  Blood culture (routine x 2)     Status: None (Preliminary result)   Collection Time: 07/04/22  9:22 PM   Specimen: BLOOD RIGHT HAND  Result Value Ref Range Status   Specimen Description BLOOD RIGHT HAND  Final   Special Requests   Final    BOTTLES DRAWN AEROBIC AND ANAEROBIC Blood Culture adequate volume   Culture   Final    NO GROWTH 2 DAYS Performed at [facility], [place] [address] [place], [place] [address]    Report Status PENDING  Incomplete  Blood culture (routine x 2)     Status: None (Preliminary result)   Collection Time: 07/04/22  9:22 PM   Specimen: BLOOD  Result Value Ref Range Status   Specimen Description BLOOD RIGHT ANTECUBITAL  Final   Special Requests   Final    BOTTLES DRAWN AEROBIC AND ANAEROBIC Blood Culture adequate volume   Culture   Final    NO GROWTH 2 DAYS Performed at [facility], [place] [address], [place], [place] [address]    Report Status PENDING  Incomplete  Respiratory (~20 pathogens) panel by PCR     Status: Abnormal   Collection Time: 07/04/22  9:27 PM   Specimen: Peripheral; Respiratory  Result Value  Ref Range Status   Adenovirus NOT DETECTED NOT DETECTED Final   Coronavirus 229E NOT DETECTED NOT DETECTED Final    Comment: (NOTE) The Coronavirus on the Respiratory Panel, DOES NOT test for the novel  Coronavirus (2019 nCoV)    Coronavirus HKU1 NOT DETECTED NOT DETECTED Final   Coronavirus NL63 NOT DETECTED NOT DETECTED Final   Coronavirus OC43 NOT DETECTED NOT DETECTED Final   Metapneumovirus NOT DETECTED  NOT DETECTED Final   Rhinovirus / Enterovirus NOT DETECTED NOT DETECTED Final   Influenza A NOT DETECTED NOT DETECTED Final   Influenza B NOT DETECTED NOT DETECTED Final   Parainfluenza Virus 1 NOT DETECTED NOT DETECTED Final   Parainfluenza Virus 2 NOT DETECTED NOT DETECTED Final   Parainfluenza Virus 3 NOT DETECTED NOT DETECTED Final   Parainfluenza Virus 4 NOT DETECTED NOT DETECTED Final   Respiratory Syncytial Virus DETECTED (A) NOT DETECTED Final   Bordetella pertussis NOT DETECTED NOT DETECTED Final   Bordetella Parapertussis NOT DETECTED NOT DETECTED Final   Chlamydophila pneumoniae NOT DETECTED NOT DETECTED Final   Mycoplasma pneumoniae NOT DETECTED NOT DETECTED Final    Comment: Performed at [facility], [place] [address], [place], [place] [address]  SARS Coronavirus 2 by RT PCR (hospital order, performed in [facility] hospital lab) *cepheid single result test* Peripheral     Status: None   Collection Time: 07/04/22  9:27 PM   Specimen: Peripheral; Nasal Swab  Result Value Ref Range Status   SARS Coronavirus 2 by RT PCR NEGATIVE NEGATIVE Final    Comment: (NOTE) SARS-CoV-2 target nucleic acids are NOT DETECTED.  The SARS-CoV-2 RNA is generally detectable in upper and lower respiratory specimens during the acute phase of infection. The lowest concentration of SARS-CoV-2 viral copies this assay can detect is 250 copies / mL. A negative result does not preclude SARS-CoV-2 infection and should not be used as the sole basis for treatment or other patient management decisions.  A negative result may occur with improper specimen collection / handling, submission of specimen other than nasopharyngeal swab, presence of viral mutation(s) within the areas targeted by this assay, and inadequate number of viral copies (<250 copies / mL). A negative result must be combined with clinical observations, patient history, and epidemiological information.  Fact Sheet for Patients:    https://www.patel.info/  Fact Sheet for Healthcare Providers: https://hall.com/  This test is not yet approved or  cleared by the [place] FDA and has been authorized for detection and/or diagnosis of SARS-CoV-2 by FDA under an Emergency Use Authorization (EUA).  This EUA will remain in effect (meaning this test can be used) for the duration of the COVID-19 declaration under Section 564(b)(1) of the Act, 21 U.S.C. section 360bbb-3(b)(1), unless the authorization is terminated or revoked sooner.  Performed at [facility], [place] [address], [place], [place] [address]      Labs: Basic Metabolic Panel: Recent Labs  Lab 07/04/22 1529 07/05/22 0433 07/06/22 0355  NA 140 138 135  K 3.8 4.1 4.2  CL 92* 94* 92*  CO2 '29 28 28  '$ GLUCOSE 113* 94 85  BUN 23 31* 49*  CREATININE 7.10* 8.69* 10.80*  CALCIUM 9.2 8.6* 8.8*  PHOS  --   --  6.4*   Liver Function Tests: Recent Labs  Lab 07/04/22 1529 07/05/22 0433 07/06/22 0355  AST 18 16  --   ALT 21 18  --   ALKPHOS 87 78  --   BILITOT 1.9* 1.8*  --  PROT 7.7 6.5  --   ALBUMIN 3.1* 2.7* 2.5*   No results for input(s): "LIPASE", "AMYLASE" in the last 168 hours. No results for input(s): "AMMONIA" in the last 168 hours. CBC: Recent Labs  Lab 07/04/22 1529 07/05/22 0433 07/06/22 0355  WBC 9.6 13.7* 15.3*  NEUTROABS 7.5  --   --   HGB 9.7* 9.2* 8.3*  HCT 30.5* 28.0* 25.6*  MCV 92.1 90.6 90.5  PLT 196 185 174   Cardiac Enzymes: No results for input(s): "CKTOTAL", "CKMB", "CKMBINDEX", "TROPONINI" in the last 168 hours. BNP: BNP (last 3 results) No results for input(s): "BNP" in the last 8760 hours.  ProBNP (last 3 results) No results for input(s): "PROBNP" in the last 8760 hours.  CBG: Recent Labs  Lab 07/04/22 2250 07/05/22 0809 07/05/22 1150 07/05/22 1634 07/06/22 0708  GLUCAP 98 93 112* 106* 78       Signed:  Nita Sells MD   [facility] 07/06/2022, 8:51 AM

## 2022-07-06 NOTE — Care Management Obs Status (Signed)
[place] NOTIFICATION   Patient Details  Name: [name] MRN: [id number] Date of Birth: May 14, 1961   Medicare Observation Status Notification Given:  Yes    Tom-Johnson, Renea Ee, RN 07/06/2022, 10:53 AM

## 2022-07-07 ENCOUNTER — Telehealth: Payer: Self-pay | Admitting: Nephrology

## 2022-07-07 NOTE — Telephone Encounter (Signed)
Transition of care contact from inpatient facility  Date of discharge: 07/06/22 Date of contact: 07/07/22  Method: Phone Spoke to: Patient's Wife  Patient contacted to discuss transition of care from recent inpatient hospitalization. Patient was admitted to [facility] from .07/04/22 -07/06/22.[facility] with discharge diagnosis of .Toxic metabolic encephalopathy secondary to medications such as opiates   Medication changes were reviewed.  Patient will follow up with his/her outpatient HD unit on: MWF schedule [facility]

## 2022-07-09 DIAGNOSIS — Z992 Dependence on renal dialysis: Secondary | ICD-10-CM | POA: Diagnosis not present

## 2022-07-09 DIAGNOSIS — D631 Anemia in chronic kidney disease: Secondary | ICD-10-CM | POA: Diagnosis not present

## 2022-07-09 DIAGNOSIS — R4182 Altered mental status, unspecified: Secondary | ICD-10-CM | POA: Diagnosis not present

## 2022-07-09 DIAGNOSIS — G9340 Encephalopathy, unspecified: Secondary | ICD-10-CM | POA: Diagnosis not present

## 2022-07-09 DIAGNOSIS — N2581 Secondary hyperparathyroidism of renal origin: Secondary | ICD-10-CM | POA: Diagnosis not present

## 2022-07-09 DIAGNOSIS — E1129 Type 2 diabetes mellitus with other diabetic kidney complication: Secondary | ICD-10-CM | POA: Diagnosis not present

## 2022-07-09 DIAGNOSIS — N186 End stage renal disease: Secondary | ICD-10-CM | POA: Diagnosis not present

## 2022-07-09 DIAGNOSIS — D509 Iron deficiency anemia, unspecified: Secondary | ICD-10-CM | POA: Diagnosis not present

## 2022-07-09 LAB — CULTURE, BLOOD (ROUTINE X 2)
Culture: NO GROWTH
Culture: NO GROWTH
Special Requests: ADEQUATE
Special Requests: ADEQUATE

## 2022-07-10 DIAGNOSIS — I132 Hypertensive heart and chronic kidney disease with heart failure and with stage 5 chronic kidney disease, or end stage renal disease: Secondary | ICD-10-CM | POA: Diagnosis not present

## 2022-07-10 DIAGNOSIS — E1122 Type 2 diabetes mellitus with diabetic chronic kidney disease: Secondary | ICD-10-CM | POA: Diagnosis not present

## 2022-07-10 DIAGNOSIS — Z941 Heart transplant status: Secondary | ICD-10-CM | POA: Diagnosis not present

## 2022-07-10 DIAGNOSIS — Z720 Tobacco use: Secondary | ICD-10-CM | POA: Diagnosis not present

## 2022-07-10 DIAGNOSIS — S2241XD Multiple fractures of ribs, right side, subsequent encounter for fracture with routine healing: Secondary | ICD-10-CM | POA: Diagnosis not present

## 2022-07-10 DIAGNOSIS — F32A Depression, unspecified: Secondary | ICD-10-CM | POA: Diagnosis not present

## 2022-07-10 DIAGNOSIS — Z992 Dependence on renal dialysis: Secondary | ICD-10-CM | POA: Diagnosis not present

## 2022-07-10 DIAGNOSIS — I2510 Atherosclerotic heart disease of native coronary artery without angina pectoris: Secondary | ICD-10-CM | POA: Diagnosis not present

## 2022-07-10 DIAGNOSIS — E1169 Type 2 diabetes mellitus with other specified complication: Secondary | ICD-10-CM | POA: Diagnosis not present

## 2022-07-10 DIAGNOSIS — Z9181 History of falling: Secondary | ICD-10-CM | POA: Diagnosis not present

## 2022-07-10 DIAGNOSIS — G928 Other toxic encephalopathy: Secondary | ICD-10-CM | POA: Diagnosis not present

## 2022-07-10 DIAGNOSIS — Z6841 Body Mass Index (BMI) 40.0 and over, adult: Secondary | ICD-10-CM | POA: Diagnosis not present

## 2022-07-10 DIAGNOSIS — N186 End stage renal disease: Secondary | ICD-10-CM | POA: Diagnosis not present

## 2022-07-10 DIAGNOSIS — Z7901 Long term (current) use of anticoagulants: Secondary | ICD-10-CM | POA: Diagnosis not present

## 2022-07-10 DIAGNOSIS — E213 Hyperparathyroidism, unspecified: Secondary | ICD-10-CM | POA: Diagnosis not present

## 2022-07-10 DIAGNOSIS — I255 Ischemic cardiomyopathy: Secondary | ICD-10-CM | POA: Diagnosis not present

## 2022-07-10 DIAGNOSIS — I5021 Acute systolic (congestive) heart failure: Secondary | ICD-10-CM | POA: Diagnosis not present

## 2022-07-10 DIAGNOSIS — I4891 Unspecified atrial fibrillation: Secondary | ICD-10-CM | POA: Diagnosis not present

## 2022-07-10 DIAGNOSIS — E785 Hyperlipidemia, unspecified: Secondary | ICD-10-CM | POA: Diagnosis not present

## 2022-07-10 DIAGNOSIS — B974 Respiratory syncytial virus as the cause of diseases classified elsewhere: Secondary | ICD-10-CM | POA: Diagnosis not present

## 2022-07-10 DIAGNOSIS — G4733 Obstructive sleep apnea (adult) (pediatric): Secondary | ICD-10-CM | POA: Diagnosis not present

## 2022-07-10 DIAGNOSIS — J441 Chronic obstructive pulmonary disease with (acute) exacerbation: Secondary | ICD-10-CM | POA: Diagnosis not present

## 2022-07-10 DIAGNOSIS — Z8673 Personal history of transient ischemic attack (TIA), and cerebral infarction without residual deficits: Secondary | ICD-10-CM | POA: Diagnosis not present

## 2022-07-10 DIAGNOSIS — T402X5D Adverse effect of other opioids, subsequent encounter: Secondary | ICD-10-CM | POA: Diagnosis not present

## 2022-07-11 DIAGNOSIS — D631 Anemia in chronic kidney disease: Secondary | ICD-10-CM | POA: Diagnosis not present

## 2022-07-11 DIAGNOSIS — N2581 Secondary hyperparathyroidism of renal origin: Secondary | ICD-10-CM | POA: Diagnosis not present

## 2022-07-11 DIAGNOSIS — Z992 Dependence on renal dialysis: Secondary | ICD-10-CM | POA: Diagnosis not present

## 2022-07-11 DIAGNOSIS — D509 Iron deficiency anemia, unspecified: Secondary | ICD-10-CM | POA: Diagnosis not present

## 2022-07-11 DIAGNOSIS — N186 End stage renal disease: Secondary | ICD-10-CM | POA: Diagnosis not present

## 2022-07-11 DIAGNOSIS — E1129 Type 2 diabetes mellitus with other diabetic kidney complication: Secondary | ICD-10-CM | POA: Diagnosis not present

## 2022-07-12 ENCOUNTER — Ambulatory Visit: Payer: Medicare Other

## 2022-07-12 DIAGNOSIS — S2249XD Multiple fractures of ribs, unspecified side, subsequent encounter for fracture with routine healing: Secondary | ICD-10-CM | POA: Diagnosis not present

## 2022-07-12 DIAGNOSIS — N186 End stage renal disease: Secondary | ICD-10-CM | POA: Diagnosis not present

## 2022-07-12 DIAGNOSIS — Z941 Heart transplant status: Secondary | ICD-10-CM | POA: Diagnosis not present

## 2022-07-12 DIAGNOSIS — Z09 Encounter for follow-up examination after completed treatment for conditions other than malignant neoplasm: Secondary | ICD-10-CM | POA: Diagnosis not present

## 2022-07-12 DIAGNOSIS — R4182 Altered mental status, unspecified: Secondary | ICD-10-CM | POA: Diagnosis not present

## 2022-07-12 DIAGNOSIS — Z992 Dependence on renal dialysis: Secondary | ICD-10-CM | POA: Diagnosis not present

## 2022-07-12 DIAGNOSIS — E782 Mixed hyperlipidemia: Secondary | ICD-10-CM | POA: Diagnosis not present

## 2022-07-13 DIAGNOSIS — D509 Iron deficiency anemia, unspecified: Secondary | ICD-10-CM | POA: Diagnosis not present

## 2022-07-13 DIAGNOSIS — Z992 Dependence on renal dialysis: Secondary | ICD-10-CM | POA: Diagnosis not present

## 2022-07-13 DIAGNOSIS — E1129 Type 2 diabetes mellitus with other diabetic kidney complication: Secondary | ICD-10-CM | POA: Diagnosis not present

## 2022-07-13 DIAGNOSIS — N2581 Secondary hyperparathyroidism of renal origin: Secondary | ICD-10-CM | POA: Diagnosis not present

## 2022-07-13 DIAGNOSIS — N186 End stage renal disease: Secondary | ICD-10-CM | POA: Diagnosis not present

## 2022-07-13 DIAGNOSIS — D631 Anemia in chronic kidney disease: Secondary | ICD-10-CM | POA: Diagnosis not present

## 2022-07-16 DIAGNOSIS — D631 Anemia in chronic kidney disease: Secondary | ICD-10-CM | POA: Diagnosis not present

## 2022-07-16 DIAGNOSIS — Z992 Dependence on renal dialysis: Secondary | ICD-10-CM | POA: Diagnosis not present

## 2022-07-16 DIAGNOSIS — N2581 Secondary hyperparathyroidism of renal origin: Secondary | ICD-10-CM | POA: Diagnosis not present

## 2022-07-16 DIAGNOSIS — D509 Iron deficiency anemia, unspecified: Secondary | ICD-10-CM | POA: Diagnosis not present

## 2022-07-16 DIAGNOSIS — N186 End stage renal disease: Secondary | ICD-10-CM | POA: Diagnosis not present

## 2022-07-16 DIAGNOSIS — E1129 Type 2 diabetes mellitus with other diabetic kidney complication: Secondary | ICD-10-CM | POA: Diagnosis not present

## 2022-07-17 DIAGNOSIS — D509 Iron deficiency anemia, unspecified: Secondary | ICD-10-CM | POA: Diagnosis not present

## 2022-07-17 DIAGNOSIS — R918 Other nonspecific abnormal finding of lung field: Secondary | ICD-10-CM | POA: Diagnosis not present

## 2022-07-17 DIAGNOSIS — F02818 Dementia in other diseases classified elsewhere, unspecified severity, with other behavioral disturbance: Secondary | ICD-10-CM | POA: Diagnosis not present

## 2022-07-17 DIAGNOSIS — Z8673 Personal history of transient ischemic attack (TIA), and cerebral infarction without residual deficits: Secondary | ICD-10-CM | POA: Diagnosis not present

## 2022-07-17 DIAGNOSIS — I480 Paroxysmal atrial fibrillation: Secondary | ICD-10-CM | POA: Diagnosis not present

## 2022-07-17 DIAGNOSIS — R251 Tremor, unspecified: Secondary | ICD-10-CM | POA: Diagnosis not present

## 2022-07-17 DIAGNOSIS — N2581 Secondary hyperparathyroidism of renal origin: Secondary | ICD-10-CM | POA: Diagnosis not present

## 2022-07-17 DIAGNOSIS — R4182 Altered mental status, unspecified: Secondary | ICD-10-CM | POA: Diagnosis not present

## 2022-07-17 DIAGNOSIS — I953 Hypotension of hemodialysis: Secondary | ICD-10-CM | POA: Diagnosis not present

## 2022-07-17 DIAGNOSIS — Z79621 Long term (current) use of calcineurin inhibitor: Secondary | ICD-10-CM | POA: Diagnosis not present

## 2022-07-17 DIAGNOSIS — Z941 Heart transplant status: Secondary | ICD-10-CM | POA: Diagnosis not present

## 2022-07-17 DIAGNOSIS — R269 Unspecified abnormalities of gait and mobility: Secondary | ICD-10-CM | POA: Diagnosis not present

## 2022-07-17 DIAGNOSIS — Z8546 Personal history of malignant neoplasm of prostate: Secondary | ICD-10-CM | POA: Diagnosis not present

## 2022-07-17 DIAGNOSIS — J189 Pneumonia, unspecified organism: Secondary | ICD-10-CM | POA: Diagnosis not present

## 2022-07-17 DIAGNOSIS — F02811 Dementia in other diseases classified elsewhere, unspecified severity, with agitation: Secondary | ICD-10-CM | POA: Diagnosis not present

## 2022-07-17 DIAGNOSIS — E1129 Type 2 diabetes mellitus with other diabetic kidney complication: Secondary | ICD-10-CM | POA: Diagnosis not present

## 2022-07-17 DIAGNOSIS — R4701 Aphasia: Secondary | ICD-10-CM | POA: Diagnosis not present

## 2022-07-17 DIAGNOSIS — G3183 Dementia with Lewy bodies: Secondary | ICD-10-CM | POA: Diagnosis not present

## 2022-07-17 DIAGNOSIS — E1122 Type 2 diabetes mellitus with diabetic chronic kidney disease: Secondary | ICD-10-CM | POA: Diagnosis not present

## 2022-07-17 DIAGNOSIS — R0602 Shortness of breath: Secondary | ICD-10-CM | POA: Diagnosis not present

## 2022-07-17 DIAGNOSIS — F05 Delirium due to known physiological condition: Secondary | ICD-10-CM | POA: Diagnosis not present

## 2022-07-17 DIAGNOSIS — F0390 Unspecified dementia without behavioral disturbance: Secondary | ICD-10-CM | POA: Diagnosis not present

## 2022-07-17 DIAGNOSIS — R1312 Dysphagia, oropharyngeal phase: Secondary | ICD-10-CM | POA: Diagnosis not present

## 2022-07-17 DIAGNOSIS — K449 Diaphragmatic hernia without obstruction or gangrene: Secondary | ICD-10-CM | POA: Diagnosis not present

## 2022-07-17 DIAGNOSIS — D84822 Immunodeficiency due to external causes: Secondary | ICD-10-CM | POA: Diagnosis not present

## 2022-07-17 DIAGNOSIS — G9340 Encephalopathy, unspecified: Secondary | ICD-10-CM | POA: Diagnosis not present

## 2022-07-17 DIAGNOSIS — Z9884 Bariatric surgery status: Secondary | ICD-10-CM | POA: Diagnosis not present

## 2022-07-17 DIAGNOSIS — K5900 Constipation, unspecified: Secondary | ICD-10-CM | POA: Diagnosis not present

## 2022-07-17 DIAGNOSIS — G20A1 Parkinson's disease without dyskinesia, without mention of fluctuations: Secondary | ICD-10-CM | POA: Diagnosis not present

## 2022-07-17 DIAGNOSIS — J210 Acute bronchiolitis due to respiratory syncytial virus: Secondary | ICD-10-CM | POA: Diagnosis not present

## 2022-07-17 DIAGNOSIS — D6804 Acquired von Willebrand disease: Secondary | ICD-10-CM | POA: Diagnosis not present

## 2022-07-17 DIAGNOSIS — G252 Other specified forms of tremor: Secondary | ICD-10-CM | POA: Diagnosis not present

## 2022-07-17 DIAGNOSIS — I288 Other diseases of pulmonary vessels: Secondary | ICD-10-CM | POA: Diagnosis not present

## 2022-07-17 DIAGNOSIS — R9431 Abnormal electrocardiogram [ECG] [EKG]: Secondary | ICD-10-CM | POA: Diagnosis not present

## 2022-07-17 DIAGNOSIS — I517 Cardiomegaly: Secondary | ICD-10-CM | POA: Diagnosis not present

## 2022-07-17 DIAGNOSIS — T8620 Unspecified complication of heart transplant: Secondary | ICD-10-CM | POA: Diagnosis not present

## 2022-07-17 DIAGNOSIS — F0282 Dementia in other diseases classified elsewhere, unspecified severity, with psychotic disturbance: Secondary | ICD-10-CM | POA: Diagnosis not present

## 2022-07-17 DIAGNOSIS — I2510 Atherosclerotic heart disease of native coronary artery without angina pectoris: Secondary | ICD-10-CM | POA: Diagnosis not present

## 2022-07-17 DIAGNOSIS — N186 End stage renal disease: Secondary | ICD-10-CM | POA: Diagnosis not present

## 2022-07-17 DIAGNOSIS — Z992 Dependence on renal dialysis: Secondary | ICD-10-CM | POA: Diagnosis not present

## 2022-07-17 DIAGNOSIS — M954 Acquired deformity of chest and rib: Secondary | ICD-10-CM | POA: Diagnosis not present

## 2022-07-17 DIAGNOSIS — Z48298 Encounter for aftercare following other organ transplant: Secondary | ICD-10-CM | POA: Diagnosis not present

## 2022-07-17 DIAGNOSIS — D631 Anemia in chronic kidney disease: Secondary | ICD-10-CM | POA: Diagnosis not present

## 2022-07-17 DIAGNOSIS — J9811 Atelectasis: Secondary | ICD-10-CM | POA: Diagnosis not present

## 2022-07-17 DIAGNOSIS — G20C Parkinsonism, unspecified: Secondary | ICD-10-CM | POA: Diagnosis not present

## 2022-07-17 DIAGNOSIS — I25811 Atherosclerosis of native coronary artery of transplanted heart without angina pectoris: Secondary | ICD-10-CM | POA: Diagnosis not present

## 2022-07-17 DIAGNOSIS — G4700 Insomnia, unspecified: Secondary | ICD-10-CM | POA: Diagnosis not present

## 2022-07-17 DIAGNOSIS — I951 Orthostatic hypotension: Secondary | ICD-10-CM | POA: Diagnosis not present

## 2022-07-17 DIAGNOSIS — Z7901 Long term (current) use of anticoagulants: Secondary | ICD-10-CM | POA: Diagnosis not present

## 2022-07-18 DIAGNOSIS — G9340 Encephalopathy, unspecified: Secondary | ICD-10-CM | POA: Diagnosis not present

## 2022-07-18 DIAGNOSIS — R269 Unspecified abnormalities of gait and mobility: Secondary | ICD-10-CM | POA: Diagnosis not present

## 2022-07-18 DIAGNOSIS — R251 Tremor, unspecified: Secondary | ICD-10-CM | POA: Diagnosis not present

## 2022-07-18 DIAGNOSIS — G20C Parkinsonism, unspecified: Secondary | ICD-10-CM | POA: Diagnosis not present

## 2022-07-18 DIAGNOSIS — R918 Other nonspecific abnormal finding of lung field: Secondary | ICD-10-CM | POA: Diagnosis not present

## 2022-07-18 DIAGNOSIS — R4182 Altered mental status, unspecified: Secondary | ICD-10-CM | POA: Diagnosis not present

## 2022-07-18 DIAGNOSIS — R9431 Abnormal electrocardiogram [ECG] [EKG]: Secondary | ICD-10-CM | POA: Diagnosis not present

## 2022-07-19 DIAGNOSIS — R9431 Abnormal electrocardiogram [ECG] [EKG]: Secondary | ICD-10-CM | POA: Diagnosis not present

## 2022-07-19 DIAGNOSIS — R269 Unspecified abnormalities of gait and mobility: Secondary | ICD-10-CM | POA: Diagnosis not present

## 2022-07-19 DIAGNOSIS — R4182 Altered mental status, unspecified: Secondary | ICD-10-CM | POA: Diagnosis not present

## 2022-07-19 DIAGNOSIS — J189 Pneumonia, unspecified organism: Secondary | ICD-10-CM | POA: Diagnosis not present

## 2022-07-19 DIAGNOSIS — Z941 Heart transplant status: Secondary | ICD-10-CM | POA: Diagnosis not present

## 2022-07-19 DIAGNOSIS — R251 Tremor, unspecified: Secondary | ICD-10-CM | POA: Diagnosis not present

## 2022-07-19 DIAGNOSIS — R918 Other nonspecific abnormal finding of lung field: Secondary | ICD-10-CM | POA: Diagnosis not present

## 2022-07-20 DIAGNOSIS — R9431 Abnormal electrocardiogram [ECG] [EKG]: Secondary | ICD-10-CM | POA: Diagnosis not present

## 2022-07-20 DIAGNOSIS — R251 Tremor, unspecified: Secondary | ICD-10-CM | POA: Diagnosis not present

## 2022-07-20 DIAGNOSIS — J189 Pneumonia, unspecified organism: Secondary | ICD-10-CM | POA: Diagnosis not present

## 2022-07-20 DIAGNOSIS — R4182 Altered mental status, unspecified: Secondary | ICD-10-CM | POA: Diagnosis not present

## 2022-07-20 DIAGNOSIS — R1312 Dysphagia, oropharyngeal phase: Secondary | ICD-10-CM | POA: Diagnosis not present

## 2022-07-20 DIAGNOSIS — R918 Other nonspecific abnormal finding of lung field: Secondary | ICD-10-CM | POA: Diagnosis not present

## 2022-07-20 DIAGNOSIS — R269 Unspecified abnormalities of gait and mobility: Secondary | ICD-10-CM | POA: Diagnosis not present

## 2022-07-20 DIAGNOSIS — Z941 Heart transplant status: Secondary | ICD-10-CM | POA: Diagnosis not present

## 2022-07-21 DIAGNOSIS — R4182 Altered mental status, unspecified: Secondary | ICD-10-CM | POA: Diagnosis not present

## 2022-07-21 DIAGNOSIS — Z941 Heart transplant status: Secondary | ICD-10-CM | POA: Diagnosis not present

## 2022-07-21 DIAGNOSIS — J189 Pneumonia, unspecified organism: Secondary | ICD-10-CM | POA: Diagnosis not present

## 2022-07-22 DIAGNOSIS — Z941 Heart transplant status: Secondary | ICD-10-CM | POA: Diagnosis not present

## 2022-07-22 DIAGNOSIS — J189 Pneumonia, unspecified organism: Secondary | ICD-10-CM | POA: Diagnosis not present

## 2022-07-22 DIAGNOSIS — R4182 Altered mental status, unspecified: Secondary | ICD-10-CM | POA: Diagnosis not present

## 2022-07-23 DIAGNOSIS — R4182 Altered mental status, unspecified: Secondary | ICD-10-CM | POA: Diagnosis not present

## 2022-07-23 DIAGNOSIS — R918 Other nonspecific abnormal finding of lung field: Secondary | ICD-10-CM | POA: Diagnosis not present

## 2022-07-23 DIAGNOSIS — J189 Pneumonia, unspecified organism: Secondary | ICD-10-CM | POA: Diagnosis not present

## 2022-07-23 DIAGNOSIS — F05 Delirium due to known physiological condition: Secondary | ICD-10-CM | POA: Diagnosis not present

## 2022-07-23 DIAGNOSIS — R1312 Dysphagia, oropharyngeal phase: Secondary | ICD-10-CM | POA: Diagnosis not present

## 2022-07-23 DIAGNOSIS — F0390 Unspecified dementia without behavioral disturbance: Secondary | ICD-10-CM | POA: Diagnosis not present

## 2022-07-23 DIAGNOSIS — Z941 Heart transplant status: Secondary | ICD-10-CM | POA: Diagnosis not present

## 2022-07-24 DIAGNOSIS — Z941 Heart transplant status: Secondary | ICD-10-CM | POA: Diagnosis not present

## 2022-07-24 DIAGNOSIS — J189 Pneumonia, unspecified organism: Secondary | ICD-10-CM | POA: Diagnosis not present

## 2022-07-24 DIAGNOSIS — R4182 Altered mental status, unspecified: Secondary | ICD-10-CM | POA: Diagnosis not present

## 2022-07-24 DIAGNOSIS — R918 Other nonspecific abnormal finding of lung field: Secondary | ICD-10-CM | POA: Diagnosis not present

## 2022-07-25 DIAGNOSIS — R251 Tremor, unspecified: Secondary | ICD-10-CM | POA: Diagnosis not present

## 2022-07-25 DIAGNOSIS — R918 Other nonspecific abnormal finding of lung field: Secondary | ICD-10-CM | POA: Diagnosis not present

## 2022-07-25 DIAGNOSIS — J189 Pneumonia, unspecified organism: Secondary | ICD-10-CM | POA: Diagnosis not present

## 2022-07-25 DIAGNOSIS — G252 Other specified forms of tremor: Secondary | ICD-10-CM | POA: Diagnosis not present

## 2022-07-25 DIAGNOSIS — R269 Unspecified abnormalities of gait and mobility: Secondary | ICD-10-CM | POA: Diagnosis not present

## 2022-07-25 DIAGNOSIS — R4182 Altered mental status, unspecified: Secondary | ICD-10-CM | POA: Diagnosis not present

## 2022-07-25 DIAGNOSIS — R0602 Shortness of breath: Secondary | ICD-10-CM | POA: Diagnosis not present

## 2022-07-25 DIAGNOSIS — Z941 Heart transplant status: Secondary | ICD-10-CM | POA: Diagnosis not present

## 2022-07-26 DIAGNOSIS — N186 End stage renal disease: Secondary | ICD-10-CM | POA: Diagnosis not present

## 2022-07-26 DIAGNOSIS — Z992 Dependence on renal dialysis: Secondary | ICD-10-CM | POA: Diagnosis not present

## 2022-07-26 DIAGNOSIS — J189 Pneumonia, unspecified organism: Secondary | ICD-10-CM | POA: Diagnosis not present

## 2022-07-26 DIAGNOSIS — R1312 Dysphagia, oropharyngeal phase: Secondary | ICD-10-CM | POA: Diagnosis not present

## 2022-07-26 DIAGNOSIS — R4182 Altered mental status, unspecified: Secondary | ICD-10-CM | POA: Diagnosis not present

## 2022-07-26 DIAGNOSIS — Z941 Heart transplant status: Secondary | ICD-10-CM | POA: Diagnosis not present

## 2022-07-27 DIAGNOSIS — R251 Tremor, unspecified: Secondary | ICD-10-CM | POA: Diagnosis not present

## 2022-07-27 DIAGNOSIS — N186 End stage renal disease: Secondary | ICD-10-CM | POA: Diagnosis not present

## 2022-07-27 DIAGNOSIS — J189 Pneumonia, unspecified organism: Secondary | ICD-10-CM | POA: Diagnosis not present

## 2022-07-27 DIAGNOSIS — R4182 Altered mental status, unspecified: Secondary | ICD-10-CM | POA: Diagnosis not present

## 2022-07-27 DIAGNOSIS — Z992 Dependence on renal dialysis: Secondary | ICD-10-CM | POA: Diagnosis not present

## 2022-07-27 DIAGNOSIS — R269 Unspecified abnormalities of gait and mobility: Secondary | ICD-10-CM | POA: Diagnosis not present

## 2022-07-27 DIAGNOSIS — T8620 Unspecified complication of heart transplant: Secondary | ICD-10-CM | POA: Diagnosis not present

## 2022-07-27 DIAGNOSIS — Z941 Heart transplant status: Secondary | ICD-10-CM | POA: Diagnosis not present

## 2022-07-27 DIAGNOSIS — R918 Other nonspecific abnormal finding of lung field: Secondary | ICD-10-CM | POA: Diagnosis not present

## 2022-07-28 DIAGNOSIS — N186 End stage renal disease: Secondary | ICD-10-CM | POA: Diagnosis not present

## 2022-07-28 DIAGNOSIS — Z992 Dependence on renal dialysis: Secondary | ICD-10-CM | POA: Diagnosis not present

## 2022-07-28 DIAGNOSIS — R1312 Dysphagia, oropharyngeal phase: Secondary | ICD-10-CM | POA: Diagnosis not present

## 2022-07-29 DIAGNOSIS — R1312 Dysphagia, oropharyngeal phase: Secondary | ICD-10-CM | POA: Diagnosis not present

## 2022-07-30 DIAGNOSIS — R1312 Dysphagia, oropharyngeal phase: Secondary | ICD-10-CM | POA: Diagnosis not present

## 2022-07-30 DIAGNOSIS — N186 End stage renal disease: Secondary | ICD-10-CM | POA: Diagnosis not present

## 2022-07-30 DIAGNOSIS — Z992 Dependence on renal dialysis: Secondary | ICD-10-CM | POA: Diagnosis not present

## 2022-07-31 DIAGNOSIS — N186 End stage renal disease: Secondary | ICD-10-CM | POA: Diagnosis not present

## 2022-07-31 DIAGNOSIS — R1312 Dysphagia, oropharyngeal phase: Secondary | ICD-10-CM | POA: Diagnosis not present

## 2022-07-31 DIAGNOSIS — Z992 Dependence on renal dialysis: Secondary | ICD-10-CM | POA: Diagnosis not present

## 2022-07-31 DIAGNOSIS — Z941 Heart transplant status: Secondary | ICD-10-CM | POA: Diagnosis not present

## 2022-07-31 DIAGNOSIS — F05 Delirium due to known physiological condition: Secondary | ICD-10-CM | POA: Diagnosis not present

## 2022-08-01 DIAGNOSIS — Z992 Dependence on renal dialysis: Secondary | ICD-10-CM | POA: Diagnosis not present

## 2022-08-01 DIAGNOSIS — R1312 Dysphagia, oropharyngeal phase: Secondary | ICD-10-CM | POA: Diagnosis not present

## 2022-08-01 DIAGNOSIS — N186 End stage renal disease: Secondary | ICD-10-CM | POA: Diagnosis not present

## 2022-08-02 DIAGNOSIS — Z992 Dependence on renal dialysis: Secondary | ICD-10-CM | POA: Diagnosis not present

## 2022-08-02 DIAGNOSIS — N186 End stage renal disease: Secondary | ICD-10-CM | POA: Diagnosis not present

## 2022-08-02 DIAGNOSIS — R1312 Dysphagia, oropharyngeal phase: Secondary | ICD-10-CM | POA: Diagnosis not present

## 2022-08-03 DIAGNOSIS — N186 End stage renal disease: Secondary | ICD-10-CM | POA: Diagnosis not present

## 2022-08-03 DIAGNOSIS — Z992 Dependence on renal dialysis: Secondary | ICD-10-CM | POA: Diagnosis not present

## 2022-08-03 DIAGNOSIS — R1312 Dysphagia, oropharyngeal phase: Secondary | ICD-10-CM | POA: Diagnosis not present

## 2022-08-04 DIAGNOSIS — R4182 Altered mental status, unspecified: Secondary | ICD-10-CM | POA: Diagnosis not present

## 2022-08-04 DIAGNOSIS — Z992 Dependence on renal dialysis: Secondary | ICD-10-CM | POA: Diagnosis not present

## 2022-08-04 DIAGNOSIS — N186 End stage renal disease: Secondary | ICD-10-CM | POA: Diagnosis not present

## 2022-08-04 DIAGNOSIS — Z941 Heart transplant status: Secondary | ICD-10-CM | POA: Diagnosis not present

## 2022-08-05 DIAGNOSIS — N186 End stage renal disease: Secondary | ICD-10-CM | POA: Diagnosis not present

## 2022-08-05 DIAGNOSIS — Z992 Dependence on renal dialysis: Secondary | ICD-10-CM | POA: Diagnosis not present

## 2022-08-05 DIAGNOSIS — Z941 Heart transplant status: Secondary | ICD-10-CM | POA: Diagnosis not present

## 2022-08-05 DIAGNOSIS — R4182 Altered mental status, unspecified: Secondary | ICD-10-CM | POA: Diagnosis not present

## 2022-08-06 DIAGNOSIS — R1312 Dysphagia, oropharyngeal phase: Secondary | ICD-10-CM | POA: Diagnosis not present

## 2022-08-06 DIAGNOSIS — N186 End stage renal disease: Secondary | ICD-10-CM | POA: Diagnosis not present

## 2022-08-06 DIAGNOSIS — Z941 Heart transplant status: Secondary | ICD-10-CM | POA: Diagnosis not present

## 2022-08-06 DIAGNOSIS — R4182 Altered mental status, unspecified: Secondary | ICD-10-CM | POA: Diagnosis not present

## 2022-08-06 DIAGNOSIS — Z992 Dependence on renal dialysis: Secondary | ICD-10-CM | POA: Diagnosis not present

## 2022-08-07 DIAGNOSIS — R4182 Altered mental status, unspecified: Secondary | ICD-10-CM | POA: Diagnosis not present

## 2022-08-07 DIAGNOSIS — Z992 Dependence on renal dialysis: Secondary | ICD-10-CM | POA: Diagnosis not present

## 2022-08-07 DIAGNOSIS — N186 End stage renal disease: Secondary | ICD-10-CM | POA: Diagnosis not present

## 2022-08-07 DIAGNOSIS — Z941 Heart transplant status: Secondary | ICD-10-CM | POA: Diagnosis not present

## 2022-08-07 DIAGNOSIS — R1312 Dysphagia, oropharyngeal phase: Secondary | ICD-10-CM | POA: Diagnosis not present

## 2022-08-08 DIAGNOSIS — R1312 Dysphagia, oropharyngeal phase: Secondary | ICD-10-CM | POA: Diagnosis not present

## 2022-08-08 DIAGNOSIS — R4182 Altered mental status, unspecified: Secondary | ICD-10-CM | POA: Diagnosis not present

## 2022-08-08 DIAGNOSIS — Z941 Heart transplant status: Secondary | ICD-10-CM | POA: Diagnosis not present

## 2022-08-08 DIAGNOSIS — Z992 Dependence on renal dialysis: Secondary | ICD-10-CM | POA: Diagnosis not present

## 2022-08-08 DIAGNOSIS — N186 End stage renal disease: Secondary | ICD-10-CM | POA: Diagnosis not present

## 2022-08-09 DIAGNOSIS — J9811 Atelectasis: Secondary | ICD-10-CM | POA: Diagnosis not present

## 2022-08-09 DIAGNOSIS — R4182 Altered mental status, unspecified: Secondary | ICD-10-CM | POA: Diagnosis not present

## 2022-08-09 DIAGNOSIS — Z941 Heart transplant status: Secondary | ICD-10-CM | POA: Diagnosis not present

## 2022-08-09 DIAGNOSIS — Z992 Dependence on renal dialysis: Secondary | ICD-10-CM | POA: Diagnosis not present

## 2022-08-09 DIAGNOSIS — N186 End stage renal disease: Secondary | ICD-10-CM | POA: Diagnosis not present

## 2022-08-09 DIAGNOSIS — R918 Other nonspecific abnormal finding of lung field: Secondary | ICD-10-CM | POA: Diagnosis not present

## 2022-08-09 DIAGNOSIS — R1312 Dysphagia, oropharyngeal phase: Secondary | ICD-10-CM | POA: Diagnosis not present

## 2022-08-09 DIAGNOSIS — M954 Acquired deformity of chest and rib: Secondary | ICD-10-CM | POA: Diagnosis not present

## 2022-08-10 DIAGNOSIS — N186 End stage renal disease: Secondary | ICD-10-CM | POA: Diagnosis not present

## 2022-08-10 DIAGNOSIS — R4182 Altered mental status, unspecified: Secondary | ICD-10-CM | POA: Diagnosis not present

## 2022-08-10 DIAGNOSIS — Z941 Heart transplant status: Secondary | ICD-10-CM | POA: Diagnosis not present

## 2022-08-10 DIAGNOSIS — R1312 Dysphagia, oropharyngeal phase: Secondary | ICD-10-CM | POA: Diagnosis not present

## 2022-08-10 DIAGNOSIS — Z992 Dependence on renal dialysis: Secondary | ICD-10-CM | POA: Diagnosis not present

## 2022-08-11 DIAGNOSIS — Z941 Heart transplant status: Secondary | ICD-10-CM | POA: Diagnosis not present

## 2022-08-11 DIAGNOSIS — Z992 Dependence on renal dialysis: Secondary | ICD-10-CM | POA: Diagnosis not present

## 2022-08-11 DIAGNOSIS — Z48298 Encounter for aftercare following other organ transplant: Secondary | ICD-10-CM | POA: Diagnosis not present

## 2022-08-11 DIAGNOSIS — R1312 Dysphagia, oropharyngeal phase: Secondary | ICD-10-CM | POA: Diagnosis not present

## 2022-08-11 DIAGNOSIS — R4182 Altered mental status, unspecified: Secondary | ICD-10-CM | POA: Diagnosis not present

## 2022-08-11 DIAGNOSIS — N186 End stage renal disease: Secondary | ICD-10-CM | POA: Diagnosis not present

## 2022-08-12 DIAGNOSIS — N186 End stage renal disease: Secondary | ICD-10-CM | POA: Diagnosis not present

## 2022-08-12 DIAGNOSIS — R4182 Altered mental status, unspecified: Secondary | ICD-10-CM | POA: Diagnosis not present

## 2022-08-12 DIAGNOSIS — Z48298 Encounter for aftercare following other organ transplant: Secondary | ICD-10-CM | POA: Diagnosis not present

## 2022-08-12 DIAGNOSIS — Z992 Dependence on renal dialysis: Secondary | ICD-10-CM | POA: Diagnosis not present

## 2022-08-12 DIAGNOSIS — Z941 Heart transplant status: Secondary | ICD-10-CM | POA: Diagnosis not present

## 2022-08-12 DIAGNOSIS — R1312 Dysphagia, oropharyngeal phase: Secondary | ICD-10-CM | POA: Diagnosis not present

## 2022-08-15 DIAGNOSIS — D509 Iron deficiency anemia, unspecified: Secondary | ICD-10-CM | POA: Diagnosis not present

## 2022-08-15 DIAGNOSIS — E1129 Type 2 diabetes mellitus with other diabetic kidney complication: Secondary | ICD-10-CM | POA: Diagnosis not present

## 2022-08-15 DIAGNOSIS — N186 End stage renal disease: Secondary | ICD-10-CM | POA: Diagnosis not present

## 2022-08-15 DIAGNOSIS — D631 Anemia in chronic kidney disease: Secondary | ICD-10-CM | POA: Diagnosis not present

## 2022-08-15 DIAGNOSIS — R4182 Altered mental status, unspecified: Secondary | ICD-10-CM | POA: Diagnosis not present

## 2022-08-15 DIAGNOSIS — Z992 Dependence on renal dialysis: Secondary | ICD-10-CM | POA: Diagnosis not present

## 2022-08-15 DIAGNOSIS — N2581 Secondary hyperparathyroidism of renal origin: Secondary | ICD-10-CM | POA: Diagnosis not present

## 2022-08-17 DIAGNOSIS — M14672 Charcot's joint, left ankle and foot: Secondary | ICD-10-CM | POA: Diagnosis not present

## 2022-08-17 DIAGNOSIS — N186 End stage renal disease: Secondary | ICD-10-CM | POA: Diagnosis not present

## 2022-08-17 DIAGNOSIS — D509 Iron deficiency anemia, unspecified: Secondary | ICD-10-CM | POA: Diagnosis not present

## 2022-08-17 DIAGNOSIS — M14671 Charcot's joint, right ankle and foot: Secondary | ICD-10-CM | POA: Diagnosis not present

## 2022-08-17 DIAGNOSIS — D631 Anemia in chronic kidney disease: Secondary | ICD-10-CM | POA: Diagnosis not present

## 2022-08-17 DIAGNOSIS — E1129 Type 2 diabetes mellitus with other diabetic kidney complication: Secondary | ICD-10-CM | POA: Diagnosis not present

## 2022-08-17 DIAGNOSIS — Z992 Dependence on renal dialysis: Secondary | ICD-10-CM | POA: Diagnosis not present

## 2022-08-17 DIAGNOSIS — N2581 Secondary hyperparathyroidism of renal origin: Secondary | ICD-10-CM | POA: Diagnosis not present

## 2022-08-18 DIAGNOSIS — I120 Hypertensive chronic kidney disease with stage 5 chronic kidney disease or end stage renal disease: Secondary | ICD-10-CM | POA: Diagnosis not present

## 2022-08-18 DIAGNOSIS — Z992 Dependence on renal dialysis: Secondary | ICD-10-CM | POA: Diagnosis not present

## 2022-08-18 DIAGNOSIS — G252 Other specified forms of tremor: Secondary | ICD-10-CM | POA: Diagnosis not present

## 2022-08-18 DIAGNOSIS — Z8709 Personal history of other diseases of the respiratory system: Secondary | ICD-10-CM | POA: Diagnosis not present

## 2022-08-18 DIAGNOSIS — I952 Hypotension due to drugs: Secondary | ICD-10-CM | POA: Diagnosis not present

## 2022-08-18 DIAGNOSIS — E7800 Pure hypercholesterolemia, unspecified: Secondary | ICD-10-CM | POA: Diagnosis not present

## 2022-08-18 DIAGNOSIS — I25811 Atherosclerosis of native coronary artery of transplanted heart without angina pectoris: Secondary | ICD-10-CM | POA: Diagnosis not present

## 2022-08-18 DIAGNOSIS — I69323 Fluency disorder following cerebral infarction: Secondary | ICD-10-CM | POA: Diagnosis not present

## 2022-08-18 DIAGNOSIS — I69392 Facial weakness following cerebral infarction: Secondary | ICD-10-CM | POA: Diagnosis not present

## 2022-08-18 DIAGNOSIS — G8929 Other chronic pain: Secondary | ICD-10-CM | POA: Diagnosis not present

## 2022-08-18 DIAGNOSIS — F419 Anxiety disorder, unspecified: Secondary | ICD-10-CM | POA: Diagnosis not present

## 2022-08-18 DIAGNOSIS — R29898 Other symptoms and signs involving the musculoskeletal system: Secondary | ICD-10-CM | POA: Diagnosis not present

## 2022-08-18 DIAGNOSIS — E1140 Type 2 diabetes mellitus with diabetic neuropathy, unspecified: Secondary | ICD-10-CM | POA: Diagnosis not present

## 2022-08-18 DIAGNOSIS — E669 Obesity, unspecified: Secondary | ICD-10-CM | POA: Diagnosis not present

## 2022-08-18 DIAGNOSIS — Z6833 Body mass index (BMI) 33.0-33.9, adult: Secondary | ICD-10-CM | POA: Diagnosis not present

## 2022-08-18 DIAGNOSIS — I69398 Other sequelae of cerebral infarction: Secondary | ICD-10-CM | POA: Diagnosis not present

## 2022-08-18 DIAGNOSIS — Z9884 Bariatric surgery status: Secondary | ICD-10-CM | POA: Diagnosis not present

## 2022-08-18 DIAGNOSIS — S2241XD Multiple fractures of ribs, right side, subsequent encounter for fracture with routine healing: Secondary | ICD-10-CM | POA: Diagnosis not present

## 2022-08-18 DIAGNOSIS — G4700 Insomnia, unspecified: Secondary | ICD-10-CM | POA: Diagnosis not present

## 2022-08-18 DIAGNOSIS — C61 Malignant neoplasm of prostate: Secondary | ICD-10-CM | POA: Diagnosis not present

## 2022-08-18 DIAGNOSIS — I480 Paroxysmal atrial fibrillation: Secondary | ICD-10-CM | POA: Diagnosis not present

## 2022-08-18 DIAGNOSIS — N186 End stage renal disease: Secondary | ICD-10-CM | POA: Diagnosis not present

## 2022-08-18 DIAGNOSIS — E1122 Type 2 diabetes mellitus with diabetic chronic kidney disease: Secondary | ICD-10-CM | POA: Diagnosis not present

## 2022-08-18 DIAGNOSIS — R1312 Dysphagia, oropharyngeal phase: Secondary | ICD-10-CM | POA: Diagnosis not present

## 2022-08-18 DIAGNOSIS — D631 Anemia in chronic kidney disease: Secondary | ICD-10-CM | POA: Diagnosis not present

## 2022-08-19 DIAGNOSIS — E1129 Type 2 diabetes mellitus with other diabetic kidney complication: Secondary | ICD-10-CM | POA: Diagnosis not present

## 2022-08-19 DIAGNOSIS — N2581 Secondary hyperparathyroidism of renal origin: Secondary | ICD-10-CM | POA: Diagnosis not present

## 2022-08-19 DIAGNOSIS — D509 Iron deficiency anemia, unspecified: Secondary | ICD-10-CM | POA: Diagnosis not present

## 2022-08-19 DIAGNOSIS — Z992 Dependence on renal dialysis: Secondary | ICD-10-CM | POA: Diagnosis not present

## 2022-08-19 DIAGNOSIS — D631 Anemia in chronic kidney disease: Secondary | ICD-10-CM | POA: Diagnosis not present

## 2022-08-19 DIAGNOSIS — N186 End stage renal disease: Secondary | ICD-10-CM | POA: Diagnosis not present

## 2022-08-22 DIAGNOSIS — D509 Iron deficiency anemia, unspecified: Secondary | ICD-10-CM | POA: Diagnosis not present

## 2022-08-22 DIAGNOSIS — N2581 Secondary hyperparathyroidism of renal origin: Secondary | ICD-10-CM | POA: Diagnosis not present

## 2022-08-22 DIAGNOSIS — Z992 Dependence on renal dialysis: Secondary | ICD-10-CM | POA: Diagnosis not present

## 2022-08-22 DIAGNOSIS — D631 Anemia in chronic kidney disease: Secondary | ICD-10-CM | POA: Diagnosis not present

## 2022-08-22 DIAGNOSIS — N186 End stage renal disease: Secondary | ICD-10-CM | POA: Diagnosis not present

## 2022-08-22 DIAGNOSIS — E1129 Type 2 diabetes mellitus with other diabetic kidney complication: Secondary | ICD-10-CM | POA: Diagnosis not present

## 2022-08-23 DIAGNOSIS — E1122 Type 2 diabetes mellitus with diabetic chronic kidney disease: Secondary | ICD-10-CM | POA: Diagnosis not present

## 2022-08-23 DIAGNOSIS — Z992 Dependence on renal dialysis: Secondary | ICD-10-CM | POA: Diagnosis not present

## 2022-08-23 DIAGNOSIS — N186 End stage renal disease: Secondary | ICD-10-CM | POA: Diagnosis not present

## 2022-08-23 DIAGNOSIS — C61 Malignant neoplasm of prostate: Secondary | ICD-10-CM | POA: Diagnosis not present

## 2022-08-23 DIAGNOSIS — E1140 Type 2 diabetes mellitus with diabetic neuropathy, unspecified: Secondary | ICD-10-CM | POA: Diagnosis not present

## 2022-08-23 DIAGNOSIS — I120 Hypertensive chronic kidney disease with stage 5 chronic kidney disease or end stage renal disease: Secondary | ICD-10-CM | POA: Diagnosis not present

## 2022-08-24 DIAGNOSIS — I120 Hypertensive chronic kidney disease with stage 5 chronic kidney disease or end stage renal disease: Secondary | ICD-10-CM | POA: Diagnosis not present

## 2022-08-24 DIAGNOSIS — D631 Anemia in chronic kidney disease: Secondary | ICD-10-CM | POA: Diagnosis not present

## 2022-08-24 DIAGNOSIS — E1122 Type 2 diabetes mellitus with diabetic chronic kidney disease: Secondary | ICD-10-CM | POA: Diagnosis not present

## 2022-08-24 DIAGNOSIS — N186 End stage renal disease: Secondary | ICD-10-CM | POA: Diagnosis not present

## 2022-08-24 DIAGNOSIS — E1140 Type 2 diabetes mellitus with diabetic neuropathy, unspecified: Secondary | ICD-10-CM | POA: Diagnosis not present

## 2022-08-24 DIAGNOSIS — D509 Iron deficiency anemia, unspecified: Secondary | ICD-10-CM | POA: Diagnosis not present

## 2022-08-24 DIAGNOSIS — N2581 Secondary hyperparathyroidism of renal origin: Secondary | ICD-10-CM | POA: Diagnosis not present

## 2022-08-24 DIAGNOSIS — C61 Malignant neoplasm of prostate: Secondary | ICD-10-CM | POA: Diagnosis not present

## 2022-08-24 DIAGNOSIS — E1129 Type 2 diabetes mellitus with other diabetic kidney complication: Secondary | ICD-10-CM | POA: Diagnosis not present

## 2022-08-24 DIAGNOSIS — Z992 Dependence on renal dialysis: Secondary | ICD-10-CM | POA: Diagnosis not present

## 2022-08-26 DIAGNOSIS — N2581 Secondary hyperparathyroidism of renal origin: Secondary | ICD-10-CM | POA: Diagnosis not present

## 2022-08-26 DIAGNOSIS — D631 Anemia in chronic kidney disease: Secondary | ICD-10-CM | POA: Diagnosis not present

## 2022-08-26 DIAGNOSIS — D509 Iron deficiency anemia, unspecified: Secondary | ICD-10-CM | POA: Diagnosis not present

## 2022-08-26 DIAGNOSIS — N186 End stage renal disease: Secondary | ICD-10-CM | POA: Diagnosis not present

## 2022-08-26 DIAGNOSIS — Z992 Dependence on renal dialysis: Secondary | ICD-10-CM | POA: Diagnosis not present

## 2022-08-26 DIAGNOSIS — E1129 Type 2 diabetes mellitus with other diabetic kidney complication: Secondary | ICD-10-CM | POA: Diagnosis not present

## 2022-08-27 DIAGNOSIS — Z992 Dependence on renal dialysis: Secondary | ICD-10-CM | POA: Diagnosis not present

## 2022-08-27 DIAGNOSIS — N186 End stage renal disease: Secondary | ICD-10-CM | POA: Diagnosis not present

## 2022-08-27 DIAGNOSIS — T8620 Unspecified complication of heart transplant: Secondary | ICD-10-CM | POA: Diagnosis not present

## 2022-08-29 DIAGNOSIS — N186 End stage renal disease: Secondary | ICD-10-CM | POA: Diagnosis not present

## 2022-08-29 DIAGNOSIS — D631 Anemia in chronic kidney disease: Secondary | ICD-10-CM | POA: Diagnosis not present

## 2022-08-29 DIAGNOSIS — Z992 Dependence on renal dialysis: Secondary | ICD-10-CM | POA: Diagnosis not present

## 2022-08-29 DIAGNOSIS — N2581 Secondary hyperparathyroidism of renal origin: Secondary | ICD-10-CM | POA: Diagnosis not present

## 2022-08-29 DIAGNOSIS — I120 Hypertensive chronic kidney disease with stage 5 chronic kidney disease or end stage renal disease: Secondary | ICD-10-CM | POA: Diagnosis not present

## 2022-08-29 DIAGNOSIS — D509 Iron deficiency anemia, unspecified: Secondary | ICD-10-CM | POA: Diagnosis not present

## 2022-08-29 DIAGNOSIS — C61 Malignant neoplasm of prostate: Secondary | ICD-10-CM | POA: Diagnosis not present

## 2022-08-29 DIAGNOSIS — E1122 Type 2 diabetes mellitus with diabetic chronic kidney disease: Secondary | ICD-10-CM | POA: Diagnosis not present

## 2022-08-29 DIAGNOSIS — E1129 Type 2 diabetes mellitus with other diabetic kidney complication: Secondary | ICD-10-CM | POA: Diagnosis not present

## 2022-08-29 DIAGNOSIS — E1140 Type 2 diabetes mellitus with diabetic neuropathy, unspecified: Secondary | ICD-10-CM | POA: Diagnosis not present

## 2022-08-30 DIAGNOSIS — E1140 Type 2 diabetes mellitus with diabetic neuropathy, unspecified: Secondary | ICD-10-CM | POA: Diagnosis not present

## 2022-08-30 DIAGNOSIS — C61 Malignant neoplasm of prostate: Secondary | ICD-10-CM | POA: Diagnosis not present

## 2022-08-30 DIAGNOSIS — I120 Hypertensive chronic kidney disease with stage 5 chronic kidney disease or end stage renal disease: Secondary | ICD-10-CM | POA: Diagnosis not present

## 2022-08-30 DIAGNOSIS — N186 End stage renal disease: Secondary | ICD-10-CM | POA: Diagnosis not present

## 2022-08-30 DIAGNOSIS — E1122 Type 2 diabetes mellitus with diabetic chronic kidney disease: Secondary | ICD-10-CM | POA: Diagnosis not present

## 2022-08-30 DIAGNOSIS — Z992 Dependence on renal dialysis: Secondary | ICD-10-CM | POA: Diagnosis not present

## 2022-08-31 DIAGNOSIS — D631 Anemia in chronic kidney disease: Secondary | ICD-10-CM | POA: Diagnosis not present

## 2022-08-31 DIAGNOSIS — N186 End stage renal disease: Secondary | ICD-10-CM | POA: Diagnosis not present

## 2022-08-31 DIAGNOSIS — Z992 Dependence on renal dialysis: Secondary | ICD-10-CM | POA: Diagnosis not present

## 2022-08-31 DIAGNOSIS — N2581 Secondary hyperparathyroidism of renal origin: Secondary | ICD-10-CM | POA: Diagnosis not present

## 2022-08-31 DIAGNOSIS — D509 Iron deficiency anemia, unspecified: Secondary | ICD-10-CM | POA: Diagnosis not present

## 2022-08-31 DIAGNOSIS — E1129 Type 2 diabetes mellitus with other diabetic kidney complication: Secondary | ICD-10-CM | POA: Diagnosis not present

## 2022-09-03 DIAGNOSIS — Z992 Dependence on renal dialysis: Secondary | ICD-10-CM | POA: Diagnosis not present

## 2022-09-03 DIAGNOSIS — D631 Anemia in chronic kidney disease: Secondary | ICD-10-CM | POA: Diagnosis not present

## 2022-09-03 DIAGNOSIS — N186 End stage renal disease: Secondary | ICD-10-CM | POA: Diagnosis not present

## 2022-09-03 DIAGNOSIS — E1129 Type 2 diabetes mellitus with other diabetic kidney complication: Secondary | ICD-10-CM | POA: Diagnosis not present

## 2022-09-03 DIAGNOSIS — N2581 Secondary hyperparathyroidism of renal origin: Secondary | ICD-10-CM | POA: Diagnosis not present

## 2022-09-03 DIAGNOSIS — D509 Iron deficiency anemia, unspecified: Secondary | ICD-10-CM | POA: Diagnosis not present

## 2022-09-04 DIAGNOSIS — N186 End stage renal disease: Secondary | ICD-10-CM | POA: Diagnosis not present

## 2022-09-04 DIAGNOSIS — E1122 Type 2 diabetes mellitus with diabetic chronic kidney disease: Secondary | ICD-10-CM | POA: Diagnosis not present

## 2022-09-04 DIAGNOSIS — C61 Malignant neoplasm of prostate: Secondary | ICD-10-CM | POA: Diagnosis not present

## 2022-09-04 DIAGNOSIS — Z992 Dependence on renal dialysis: Secondary | ICD-10-CM | POA: Diagnosis not present

## 2022-09-04 DIAGNOSIS — E1140 Type 2 diabetes mellitus with diabetic neuropathy, unspecified: Secondary | ICD-10-CM | POA: Diagnosis not present

## 2022-09-04 DIAGNOSIS — I120 Hypertensive chronic kidney disease with stage 5 chronic kidney disease or end stage renal disease: Secondary | ICD-10-CM | POA: Diagnosis not present

## 2022-09-05 DIAGNOSIS — D631 Anemia in chronic kidney disease: Secondary | ICD-10-CM | POA: Diagnosis not present

## 2022-09-05 DIAGNOSIS — D509 Iron deficiency anemia, unspecified: Secondary | ICD-10-CM | POA: Diagnosis not present

## 2022-09-05 DIAGNOSIS — Z992 Dependence on renal dialysis: Secondary | ICD-10-CM | POA: Diagnosis not present

## 2022-09-05 DIAGNOSIS — N186 End stage renal disease: Secondary | ICD-10-CM | POA: Diagnosis not present

## 2022-09-05 DIAGNOSIS — E1129 Type 2 diabetes mellitus with other diabetic kidney complication: Secondary | ICD-10-CM | POA: Diagnosis not present

## 2022-09-05 DIAGNOSIS — N2581 Secondary hyperparathyroidism of renal origin: Secondary | ICD-10-CM | POA: Diagnosis not present

## 2022-09-06 DIAGNOSIS — I120 Hypertensive chronic kidney disease with stage 5 chronic kidney disease or end stage renal disease: Secondary | ICD-10-CM | POA: Diagnosis not present

## 2022-09-06 DIAGNOSIS — C61 Malignant neoplasm of prostate: Secondary | ICD-10-CM | POA: Diagnosis not present

## 2022-09-06 DIAGNOSIS — Z992 Dependence on renal dialysis: Secondary | ICD-10-CM | POA: Diagnosis not present

## 2022-09-06 DIAGNOSIS — E1140 Type 2 diabetes mellitus with diabetic neuropathy, unspecified: Secondary | ICD-10-CM | POA: Diagnosis not present

## 2022-09-06 DIAGNOSIS — E1122 Type 2 diabetes mellitus with diabetic chronic kidney disease: Secondary | ICD-10-CM | POA: Diagnosis not present

## 2022-09-06 DIAGNOSIS — N186 End stage renal disease: Secondary | ICD-10-CM | POA: Diagnosis not present

## 2022-09-07 DIAGNOSIS — Z992 Dependence on renal dialysis: Secondary | ICD-10-CM | POA: Diagnosis not present

## 2022-09-07 DIAGNOSIS — D631 Anemia in chronic kidney disease: Secondary | ICD-10-CM | POA: Diagnosis not present

## 2022-09-07 DIAGNOSIS — N186 End stage renal disease: Secondary | ICD-10-CM | POA: Diagnosis not present

## 2022-09-07 DIAGNOSIS — N2581 Secondary hyperparathyroidism of renal origin: Secondary | ICD-10-CM | POA: Diagnosis not present

## 2022-09-07 DIAGNOSIS — D509 Iron deficiency anemia, unspecified: Secondary | ICD-10-CM | POA: Diagnosis not present

## 2022-09-07 DIAGNOSIS — E1129 Type 2 diabetes mellitus with other diabetic kidney complication: Secondary | ICD-10-CM | POA: Diagnosis not present

## 2022-09-10 DIAGNOSIS — N186 End stage renal disease: Secondary | ICD-10-CM | POA: Diagnosis not present

## 2022-09-10 DIAGNOSIS — Z992 Dependence on renal dialysis: Secondary | ICD-10-CM | POA: Diagnosis not present

## 2022-09-10 DIAGNOSIS — D509 Iron deficiency anemia, unspecified: Secondary | ICD-10-CM | POA: Diagnosis not present

## 2022-09-10 DIAGNOSIS — N2581 Secondary hyperparathyroidism of renal origin: Secondary | ICD-10-CM | POA: Diagnosis not present

## 2022-09-10 DIAGNOSIS — E1129 Type 2 diabetes mellitus with other diabetic kidney complication: Secondary | ICD-10-CM | POA: Diagnosis not present

## 2022-09-10 DIAGNOSIS — D631 Anemia in chronic kidney disease: Secondary | ICD-10-CM | POA: Diagnosis not present

## 2022-09-11 DIAGNOSIS — M5416 Radiculopathy, lumbar region: Secondary | ICD-10-CM | POA: Diagnosis not present

## 2022-09-12 DIAGNOSIS — E1129 Type 2 diabetes mellitus with other diabetic kidney complication: Secondary | ICD-10-CM | POA: Diagnosis not present

## 2022-09-12 DIAGNOSIS — D631 Anemia in chronic kidney disease: Secondary | ICD-10-CM | POA: Diagnosis not present

## 2022-09-12 DIAGNOSIS — Z992 Dependence on renal dialysis: Secondary | ICD-10-CM | POA: Diagnosis not present

## 2022-09-12 DIAGNOSIS — D509 Iron deficiency anemia, unspecified: Secondary | ICD-10-CM | POA: Diagnosis not present

## 2022-09-12 DIAGNOSIS — N186 End stage renal disease: Secondary | ICD-10-CM | POA: Diagnosis not present

## 2022-09-12 DIAGNOSIS — N2581 Secondary hyperparathyroidism of renal origin: Secondary | ICD-10-CM | POA: Diagnosis not present

## 2022-09-13 DIAGNOSIS — Z992 Dependence on renal dialysis: Secondary | ICD-10-CM | POA: Diagnosis not present

## 2022-09-13 DIAGNOSIS — E1122 Type 2 diabetes mellitus with diabetic chronic kidney disease: Secondary | ICD-10-CM | POA: Diagnosis not present

## 2022-09-13 DIAGNOSIS — I120 Hypertensive chronic kidney disease with stage 5 chronic kidney disease or end stage renal disease: Secondary | ICD-10-CM | POA: Diagnosis not present

## 2022-09-13 DIAGNOSIS — C61 Malignant neoplasm of prostate: Secondary | ICD-10-CM | POA: Diagnosis not present

## 2022-09-13 DIAGNOSIS — E1140 Type 2 diabetes mellitus with diabetic neuropathy, unspecified: Secondary | ICD-10-CM | POA: Diagnosis not present

## 2022-09-13 DIAGNOSIS — N186 End stage renal disease: Secondary | ICD-10-CM | POA: Diagnosis not present

## 2022-09-14 DIAGNOSIS — D631 Anemia in chronic kidney disease: Secondary | ICD-10-CM | POA: Diagnosis not present

## 2022-09-14 DIAGNOSIS — E1129 Type 2 diabetes mellitus with other diabetic kidney complication: Secondary | ICD-10-CM | POA: Diagnosis not present

## 2022-09-14 DIAGNOSIS — N2581 Secondary hyperparathyroidism of renal origin: Secondary | ICD-10-CM | POA: Diagnosis not present

## 2022-09-14 DIAGNOSIS — Z992 Dependence on renal dialysis: Secondary | ICD-10-CM | POA: Diagnosis not present

## 2022-09-14 DIAGNOSIS — D509 Iron deficiency anemia, unspecified: Secondary | ICD-10-CM | POA: Diagnosis not present

## 2022-09-14 DIAGNOSIS — N186 End stage renal disease: Secondary | ICD-10-CM | POA: Diagnosis not present

## 2022-09-17 DIAGNOSIS — I69392 Facial weakness following cerebral infarction: Secondary | ICD-10-CM | POA: Diagnosis not present

## 2022-09-17 DIAGNOSIS — E1129 Type 2 diabetes mellitus with other diabetic kidney complication: Secondary | ICD-10-CM | POA: Diagnosis not present

## 2022-09-17 DIAGNOSIS — Z8709 Personal history of other diseases of the respiratory system: Secondary | ICD-10-CM | POA: Diagnosis not present

## 2022-09-17 DIAGNOSIS — C61 Malignant neoplasm of prostate: Secondary | ICD-10-CM | POA: Diagnosis not present

## 2022-09-17 DIAGNOSIS — I69323 Fluency disorder following cerebral infarction: Secondary | ICD-10-CM | POA: Diagnosis not present

## 2022-09-17 DIAGNOSIS — I120 Hypertensive chronic kidney disease with stage 5 chronic kidney disease or end stage renal disease: Secondary | ICD-10-CM | POA: Diagnosis not present

## 2022-09-17 DIAGNOSIS — I69398 Other sequelae of cerebral infarction: Secondary | ICD-10-CM | POA: Diagnosis not present

## 2022-09-17 DIAGNOSIS — R1312 Dysphagia, oropharyngeal phase: Secondary | ICD-10-CM | POA: Diagnosis not present

## 2022-09-17 DIAGNOSIS — I25811 Atherosclerosis of native coronary artery of transplanted heart without angina pectoris: Secondary | ICD-10-CM | POA: Diagnosis not present

## 2022-09-17 DIAGNOSIS — Z6833 Body mass index (BMI) 33.0-33.9, adult: Secondary | ICD-10-CM | POA: Diagnosis not present

## 2022-09-17 DIAGNOSIS — Z941 Heart transplant status: Secondary | ICD-10-CM | POA: Diagnosis not present

## 2022-09-17 DIAGNOSIS — E1140 Type 2 diabetes mellitus with diabetic neuropathy, unspecified: Secondary | ICD-10-CM | POA: Diagnosis not present

## 2022-09-17 DIAGNOSIS — N186 End stage renal disease: Secondary | ICD-10-CM | POA: Diagnosis not present

## 2022-09-17 DIAGNOSIS — I952 Hypotension due to drugs: Secondary | ICD-10-CM | POA: Diagnosis not present

## 2022-09-17 DIAGNOSIS — E7800 Pure hypercholesterolemia, unspecified: Secondary | ICD-10-CM | POA: Diagnosis not present

## 2022-09-17 DIAGNOSIS — D849 Immunodeficiency, unspecified: Secondary | ICD-10-CM | POA: Diagnosis not present

## 2022-09-17 DIAGNOSIS — G4700 Insomnia, unspecified: Secondary | ICD-10-CM | POA: Diagnosis not present

## 2022-09-17 DIAGNOSIS — Z9884 Bariatric surgery status: Secondary | ICD-10-CM | POA: Diagnosis not present

## 2022-09-17 DIAGNOSIS — G8929 Other chronic pain: Secondary | ICD-10-CM | POA: Diagnosis not present

## 2022-09-17 DIAGNOSIS — R29898 Other symptoms and signs involving the musculoskeletal system: Secondary | ICD-10-CM | POA: Diagnosis not present

## 2022-09-17 DIAGNOSIS — D631 Anemia in chronic kidney disease: Secondary | ICD-10-CM | POA: Diagnosis not present

## 2022-09-17 DIAGNOSIS — E1122 Type 2 diabetes mellitus with diabetic chronic kidney disease: Secondary | ICD-10-CM | POA: Diagnosis not present

## 2022-09-17 DIAGNOSIS — Z992 Dependence on renal dialysis: Secondary | ICD-10-CM | POA: Diagnosis not present

## 2022-09-17 DIAGNOSIS — S2241XD Multiple fractures of ribs, right side, subsequent encounter for fracture with routine healing: Secondary | ICD-10-CM | POA: Diagnosis not present

## 2022-09-17 DIAGNOSIS — F419 Anxiety disorder, unspecified: Secondary | ICD-10-CM | POA: Diagnosis not present

## 2022-09-17 DIAGNOSIS — E669 Obesity, unspecified: Secondary | ICD-10-CM | POA: Diagnosis not present

## 2022-09-17 DIAGNOSIS — N2581 Secondary hyperparathyroidism of renal origin: Secondary | ICD-10-CM | POA: Diagnosis not present

## 2022-09-17 DIAGNOSIS — I480 Paroxysmal atrial fibrillation: Secondary | ICD-10-CM | POA: Diagnosis not present

## 2022-09-17 DIAGNOSIS — J189 Pneumonia, unspecified organism: Secondary | ICD-10-CM | POA: Diagnosis not present

## 2022-09-17 DIAGNOSIS — D509 Iron deficiency anemia, unspecified: Secondary | ICD-10-CM | POA: Diagnosis not present

## 2022-09-17 DIAGNOSIS — G252 Other specified forms of tremor: Secondary | ICD-10-CM | POA: Diagnosis not present

## 2022-09-18 DIAGNOSIS — Z992 Dependence on renal dialysis: Secondary | ICD-10-CM | POA: Diagnosis not present

## 2022-09-18 DIAGNOSIS — I120 Hypertensive chronic kidney disease with stage 5 chronic kidney disease or end stage renal disease: Secondary | ICD-10-CM | POA: Diagnosis not present

## 2022-09-18 DIAGNOSIS — N186 End stage renal disease: Secondary | ICD-10-CM | POA: Diagnosis not present

## 2022-09-18 DIAGNOSIS — E1140 Type 2 diabetes mellitus with diabetic neuropathy, unspecified: Secondary | ICD-10-CM | POA: Diagnosis not present

## 2022-09-18 DIAGNOSIS — C61 Malignant neoplasm of prostate: Secondary | ICD-10-CM | POA: Diagnosis not present

## 2022-09-18 DIAGNOSIS — E1122 Type 2 diabetes mellitus with diabetic chronic kidney disease: Secondary | ICD-10-CM | POA: Diagnosis not present

## 2022-09-18 DIAGNOSIS — M5416 Radiculopathy, lumbar region: Secondary | ICD-10-CM | POA: Diagnosis not present

## 2022-09-19 DIAGNOSIS — D509 Iron deficiency anemia, unspecified: Secondary | ICD-10-CM | POA: Diagnosis not present

## 2022-09-19 DIAGNOSIS — D631 Anemia in chronic kidney disease: Secondary | ICD-10-CM | POA: Diagnosis not present

## 2022-09-19 DIAGNOSIS — E1129 Type 2 diabetes mellitus with other diabetic kidney complication: Secondary | ICD-10-CM | POA: Diagnosis not present

## 2022-09-19 DIAGNOSIS — N2581 Secondary hyperparathyroidism of renal origin: Secondary | ICD-10-CM | POA: Diagnosis not present

## 2022-09-19 DIAGNOSIS — Z992 Dependence on renal dialysis: Secondary | ICD-10-CM | POA: Diagnosis not present

## 2022-09-19 DIAGNOSIS — N186 End stage renal disease: Secondary | ICD-10-CM | POA: Diagnosis not present

## 2022-09-21 DIAGNOSIS — N2581 Secondary hyperparathyroidism of renal origin: Secondary | ICD-10-CM | POA: Diagnosis not present

## 2022-09-21 DIAGNOSIS — D631 Anemia in chronic kidney disease: Secondary | ICD-10-CM | POA: Diagnosis not present

## 2022-09-21 DIAGNOSIS — E1129 Type 2 diabetes mellitus with other diabetic kidney complication: Secondary | ICD-10-CM | POA: Diagnosis not present

## 2022-09-21 DIAGNOSIS — D509 Iron deficiency anemia, unspecified: Secondary | ICD-10-CM | POA: Diagnosis not present

## 2022-09-21 DIAGNOSIS — N186 End stage renal disease: Secondary | ICD-10-CM | POA: Diagnosis not present

## 2022-09-21 DIAGNOSIS — Z992 Dependence on renal dialysis: Secondary | ICD-10-CM | POA: Diagnosis not present

## 2022-09-24 DIAGNOSIS — Z992 Dependence on renal dialysis: Secondary | ICD-10-CM | POA: Diagnosis not present

## 2022-09-24 DIAGNOSIS — D631 Anemia in chronic kidney disease: Secondary | ICD-10-CM | POA: Diagnosis not present

## 2022-09-24 DIAGNOSIS — D509 Iron deficiency anemia, unspecified: Secondary | ICD-10-CM | POA: Diagnosis not present

## 2022-09-24 DIAGNOSIS — N186 End stage renal disease: Secondary | ICD-10-CM | POA: Diagnosis not present

## 2022-09-24 DIAGNOSIS — N2581 Secondary hyperparathyroidism of renal origin: Secondary | ICD-10-CM | POA: Diagnosis not present

## 2022-09-24 DIAGNOSIS — E1129 Type 2 diabetes mellitus with other diabetic kidney complication: Secondary | ICD-10-CM | POA: Diagnosis not present

## 2022-09-25 DIAGNOSIS — I120 Hypertensive chronic kidney disease with stage 5 chronic kidney disease or end stage renal disease: Secondary | ICD-10-CM | POA: Diagnosis not present

## 2022-09-25 DIAGNOSIS — Z992 Dependence on renal dialysis: Secondary | ICD-10-CM | POA: Diagnosis not present

## 2022-09-25 DIAGNOSIS — C61 Malignant neoplasm of prostate: Secondary | ICD-10-CM | POA: Diagnosis not present

## 2022-09-25 DIAGNOSIS — E1122 Type 2 diabetes mellitus with diabetic chronic kidney disease: Secondary | ICD-10-CM | POA: Diagnosis not present

## 2022-09-25 DIAGNOSIS — N186 End stage renal disease: Secondary | ICD-10-CM | POA: Diagnosis not present

## 2022-09-25 DIAGNOSIS — E1140 Type 2 diabetes mellitus with diabetic neuropathy, unspecified: Secondary | ICD-10-CM | POA: Diagnosis not present

## 2022-09-26 DIAGNOSIS — Z992 Dependence on renal dialysis: Secondary | ICD-10-CM | POA: Diagnosis not present

## 2022-09-26 DIAGNOSIS — D509 Iron deficiency anemia, unspecified: Secondary | ICD-10-CM | POA: Diagnosis not present

## 2022-09-26 DIAGNOSIS — N186 End stage renal disease: Secondary | ICD-10-CM | POA: Diagnosis not present

## 2022-09-26 DIAGNOSIS — E1129 Type 2 diabetes mellitus with other diabetic kidney complication: Secondary | ICD-10-CM | POA: Diagnosis not present

## 2022-09-26 DIAGNOSIS — D631 Anemia in chronic kidney disease: Secondary | ICD-10-CM | POA: Diagnosis not present

## 2022-09-26 DIAGNOSIS — N2581 Secondary hyperparathyroidism of renal origin: Secondary | ICD-10-CM | POA: Diagnosis not present

## 2022-09-27 DIAGNOSIS — T8620 Unspecified complication of heart transplant: Secondary | ICD-10-CM | POA: Diagnosis not present

## 2022-09-27 DIAGNOSIS — Z992 Dependence on renal dialysis: Secondary | ICD-10-CM | POA: Diagnosis not present

## 2022-09-27 DIAGNOSIS — N186 End stage renal disease: Secondary | ICD-10-CM | POA: Diagnosis not present

## 2022-09-28 DIAGNOSIS — D631 Anemia in chronic kidney disease: Secondary | ICD-10-CM | POA: Diagnosis not present

## 2022-09-28 DIAGNOSIS — N2581 Secondary hyperparathyroidism of renal origin: Secondary | ICD-10-CM | POA: Diagnosis not present

## 2022-09-28 DIAGNOSIS — N186 End stage renal disease: Secondary | ICD-10-CM | POA: Diagnosis not present

## 2022-09-28 DIAGNOSIS — Z992 Dependence on renal dialysis: Secondary | ICD-10-CM | POA: Diagnosis not present

## 2022-09-28 DIAGNOSIS — E1129 Type 2 diabetes mellitus with other diabetic kidney complication: Secondary | ICD-10-CM | POA: Diagnosis not present

## 2022-09-28 DIAGNOSIS — D509 Iron deficiency anemia, unspecified: Secondary | ICD-10-CM | POA: Diagnosis not present

## 2022-10-01 DIAGNOSIS — E1129 Type 2 diabetes mellitus with other diabetic kidney complication: Secondary | ICD-10-CM | POA: Diagnosis not present

## 2022-10-01 DIAGNOSIS — D631 Anemia in chronic kidney disease: Secondary | ICD-10-CM | POA: Diagnosis not present

## 2022-10-01 DIAGNOSIS — I120 Hypertensive chronic kidney disease with stage 5 chronic kidney disease or end stage renal disease: Secondary | ICD-10-CM | POA: Diagnosis not present

## 2022-10-01 DIAGNOSIS — Z992 Dependence on renal dialysis: Secondary | ICD-10-CM | POA: Diagnosis not present

## 2022-10-01 DIAGNOSIS — N2581 Secondary hyperparathyroidism of renal origin: Secondary | ICD-10-CM | POA: Diagnosis not present

## 2022-10-01 DIAGNOSIS — N186 End stage renal disease: Secondary | ICD-10-CM | POA: Diagnosis not present

## 2022-10-01 DIAGNOSIS — E1122 Type 2 diabetes mellitus with diabetic chronic kidney disease: Secondary | ICD-10-CM | POA: Diagnosis not present

## 2022-10-01 DIAGNOSIS — D509 Iron deficiency anemia, unspecified: Secondary | ICD-10-CM | POA: Diagnosis not present

## 2022-10-01 DIAGNOSIS — E1140 Type 2 diabetes mellitus with diabetic neuropathy, unspecified: Secondary | ICD-10-CM | POA: Diagnosis not present

## 2022-10-01 DIAGNOSIS — C61 Malignant neoplasm of prostate: Secondary | ICD-10-CM | POA: Diagnosis not present

## 2022-10-03 DIAGNOSIS — D509 Iron deficiency anemia, unspecified: Secondary | ICD-10-CM | POA: Diagnosis not present

## 2022-10-03 DIAGNOSIS — N2581 Secondary hyperparathyroidism of renal origin: Secondary | ICD-10-CM | POA: Diagnosis not present

## 2022-10-03 DIAGNOSIS — N186 End stage renal disease: Secondary | ICD-10-CM | POA: Diagnosis not present

## 2022-10-03 DIAGNOSIS — D631 Anemia in chronic kidney disease: Secondary | ICD-10-CM | POA: Diagnosis not present

## 2022-10-03 DIAGNOSIS — Z992 Dependence on renal dialysis: Secondary | ICD-10-CM | POA: Diagnosis not present

## 2022-10-03 DIAGNOSIS — E1129 Type 2 diabetes mellitus with other diabetic kidney complication: Secondary | ICD-10-CM | POA: Diagnosis not present

## 2022-10-04 DIAGNOSIS — R413 Other amnesia: Secondary | ICD-10-CM | POA: Diagnosis not present

## 2022-10-04 DIAGNOSIS — G252 Other specified forms of tremor: Secondary | ICD-10-CM | POA: Diagnosis not present

## 2022-10-05 DIAGNOSIS — D631 Anemia in chronic kidney disease: Secondary | ICD-10-CM | POA: Diagnosis not present

## 2022-10-05 DIAGNOSIS — Z992 Dependence on renal dialysis: Secondary | ICD-10-CM | POA: Diagnosis not present

## 2022-10-05 DIAGNOSIS — N2581 Secondary hyperparathyroidism of renal origin: Secondary | ICD-10-CM | POA: Diagnosis not present

## 2022-10-05 DIAGNOSIS — E1129 Type 2 diabetes mellitus with other diabetic kidney complication: Secondary | ICD-10-CM | POA: Diagnosis not present

## 2022-10-05 DIAGNOSIS — D509 Iron deficiency anemia, unspecified: Secondary | ICD-10-CM | POA: Diagnosis not present

## 2022-10-05 DIAGNOSIS — N186 End stage renal disease: Secondary | ICD-10-CM | POA: Diagnosis not present

## 2022-10-08 DIAGNOSIS — E1129 Type 2 diabetes mellitus with other diabetic kidney complication: Secondary | ICD-10-CM | POA: Diagnosis not present

## 2022-10-08 DIAGNOSIS — N186 End stage renal disease: Secondary | ICD-10-CM | POA: Diagnosis not present

## 2022-10-08 DIAGNOSIS — Z992 Dependence on renal dialysis: Secondary | ICD-10-CM | POA: Diagnosis not present

## 2022-10-08 DIAGNOSIS — D631 Anemia in chronic kidney disease: Secondary | ICD-10-CM | POA: Diagnosis not present

## 2022-10-08 DIAGNOSIS — N2581 Secondary hyperparathyroidism of renal origin: Secondary | ICD-10-CM | POA: Diagnosis not present

## 2022-10-08 DIAGNOSIS — D509 Iron deficiency anemia, unspecified: Secondary | ICD-10-CM | POA: Diagnosis not present

## 2022-10-10 DIAGNOSIS — Z992 Dependence on renal dialysis: Secondary | ICD-10-CM | POA: Diagnosis not present

## 2022-10-10 DIAGNOSIS — E1129 Type 2 diabetes mellitus with other diabetic kidney complication: Secondary | ICD-10-CM | POA: Diagnosis not present

## 2022-10-10 DIAGNOSIS — N186 End stage renal disease: Secondary | ICD-10-CM | POA: Diagnosis not present

## 2022-10-10 DIAGNOSIS — D509 Iron deficiency anemia, unspecified: Secondary | ICD-10-CM | POA: Diagnosis not present

## 2022-10-10 DIAGNOSIS — D631 Anemia in chronic kidney disease: Secondary | ICD-10-CM | POA: Diagnosis not present

## 2022-10-10 DIAGNOSIS — N2581 Secondary hyperparathyroidism of renal origin: Secondary | ICD-10-CM | POA: Diagnosis not present

## 2022-10-12 DIAGNOSIS — E1129 Type 2 diabetes mellitus with other diabetic kidney complication: Secondary | ICD-10-CM | POA: Diagnosis not present

## 2022-10-12 DIAGNOSIS — C61 Malignant neoplasm of prostate: Secondary | ICD-10-CM | POA: Diagnosis not present

## 2022-10-12 DIAGNOSIS — E1140 Type 2 diabetes mellitus with diabetic neuropathy, unspecified: Secondary | ICD-10-CM | POA: Diagnosis not present

## 2022-10-12 DIAGNOSIS — D509 Iron deficiency anemia, unspecified: Secondary | ICD-10-CM | POA: Diagnosis not present

## 2022-10-12 DIAGNOSIS — E1122 Type 2 diabetes mellitus with diabetic chronic kidney disease: Secondary | ICD-10-CM | POA: Diagnosis not present

## 2022-10-12 DIAGNOSIS — Z992 Dependence on renal dialysis: Secondary | ICD-10-CM | POA: Diagnosis not present

## 2022-10-12 DIAGNOSIS — I120 Hypertensive chronic kidney disease with stage 5 chronic kidney disease or end stage renal disease: Secondary | ICD-10-CM | POA: Diagnosis not present

## 2022-10-12 DIAGNOSIS — D631 Anemia in chronic kidney disease: Secondary | ICD-10-CM | POA: Diagnosis not present

## 2022-10-12 DIAGNOSIS — N186 End stage renal disease: Secondary | ICD-10-CM | POA: Diagnosis not present

## 2022-10-12 DIAGNOSIS — N2581 Secondary hyperparathyroidism of renal origin: Secondary | ICD-10-CM | POA: Diagnosis not present

## 2022-10-15 DIAGNOSIS — D631 Anemia in chronic kidney disease: Secondary | ICD-10-CM | POA: Diagnosis not present

## 2022-10-15 DIAGNOSIS — D509 Iron deficiency anemia, unspecified: Secondary | ICD-10-CM | POA: Diagnosis not present

## 2022-10-15 DIAGNOSIS — Z992 Dependence on renal dialysis: Secondary | ICD-10-CM | POA: Diagnosis not present

## 2022-10-15 DIAGNOSIS — E1129 Type 2 diabetes mellitus with other diabetic kidney complication: Secondary | ICD-10-CM | POA: Diagnosis not present

## 2022-10-15 DIAGNOSIS — N2581 Secondary hyperparathyroidism of renal origin: Secondary | ICD-10-CM | POA: Diagnosis not present

## 2022-10-15 DIAGNOSIS — N186 End stage renal disease: Secondary | ICD-10-CM | POA: Diagnosis not present

## 2022-10-17 ENCOUNTER — Other Ambulatory Visit: Payer: Self-pay | Admitting: Neurology

## 2022-10-17 DIAGNOSIS — I25811 Atherosclerosis of native coronary artery of transplanted heart without angina pectoris: Secondary | ICD-10-CM | POA: Diagnosis not present

## 2022-10-17 DIAGNOSIS — E1122 Type 2 diabetes mellitus with diabetic chronic kidney disease: Secondary | ICD-10-CM | POA: Diagnosis not present

## 2022-10-17 DIAGNOSIS — D631 Anemia in chronic kidney disease: Secondary | ICD-10-CM | POA: Diagnosis not present

## 2022-10-17 DIAGNOSIS — E1140 Type 2 diabetes mellitus with diabetic neuropathy, unspecified: Secondary | ICD-10-CM | POA: Diagnosis not present

## 2022-10-17 DIAGNOSIS — S2241XD Multiple fractures of ribs, right side, subsequent encounter for fracture with routine healing: Secondary | ICD-10-CM | POA: Diagnosis not present

## 2022-10-17 DIAGNOSIS — E1129 Type 2 diabetes mellitus with other diabetic kidney complication: Secondary | ICD-10-CM | POA: Diagnosis not present

## 2022-10-17 DIAGNOSIS — F419 Anxiety disorder, unspecified: Secondary | ICD-10-CM | POA: Diagnosis not present

## 2022-10-17 DIAGNOSIS — R1312 Dysphagia, oropharyngeal phase: Secondary | ICD-10-CM | POA: Diagnosis not present

## 2022-10-17 DIAGNOSIS — D509 Iron deficiency anemia, unspecified: Secondary | ICD-10-CM | POA: Diagnosis not present

## 2022-10-17 DIAGNOSIS — E7800 Pure hypercholesterolemia, unspecified: Secondary | ICD-10-CM | POA: Diagnosis not present

## 2022-10-17 DIAGNOSIS — Z992 Dependence on renal dialysis: Secondary | ICD-10-CM | POA: Diagnosis not present

## 2022-10-17 DIAGNOSIS — I120 Hypertensive chronic kidney disease with stage 5 chronic kidney disease or end stage renal disease: Secondary | ICD-10-CM | POA: Diagnosis not present

## 2022-10-17 DIAGNOSIS — Z8709 Personal history of other diseases of the respiratory system: Secondary | ICD-10-CM | POA: Diagnosis not present

## 2022-10-17 DIAGNOSIS — C61 Malignant neoplasm of prostate: Secondary | ICD-10-CM | POA: Diagnosis not present

## 2022-10-17 DIAGNOSIS — N186 End stage renal disease: Secondary | ICD-10-CM | POA: Diagnosis not present

## 2022-10-17 DIAGNOSIS — Z9884 Bariatric surgery status: Secondary | ICD-10-CM | POA: Diagnosis not present

## 2022-10-17 DIAGNOSIS — Z6833 Body mass index (BMI) 33.0-33.9, adult: Secondary | ICD-10-CM | POA: Diagnosis not present

## 2022-10-17 DIAGNOSIS — G4700 Insomnia, unspecified: Secondary | ICD-10-CM | POA: Diagnosis not present

## 2022-10-17 DIAGNOSIS — I69323 Fluency disorder following cerebral infarction: Secondary | ICD-10-CM | POA: Diagnosis not present

## 2022-10-17 DIAGNOSIS — G8929 Other chronic pain: Secondary | ICD-10-CM | POA: Diagnosis not present

## 2022-10-17 DIAGNOSIS — N2581 Secondary hyperparathyroidism of renal origin: Secondary | ICD-10-CM | POA: Diagnosis not present

## 2022-10-17 DIAGNOSIS — G252 Other specified forms of tremor: Secondary | ICD-10-CM | POA: Diagnosis not present

## 2022-10-17 DIAGNOSIS — E669 Obesity, unspecified: Secondary | ICD-10-CM | POA: Diagnosis not present

## 2022-10-17 DIAGNOSIS — I69398 Other sequelae of cerebral infarction: Secondary | ICD-10-CM | POA: Diagnosis not present

## 2022-10-17 DIAGNOSIS — I480 Paroxysmal atrial fibrillation: Secondary | ICD-10-CM | POA: Diagnosis not present

## 2022-10-17 DIAGNOSIS — I69392 Facial weakness following cerebral infarction: Secondary | ICD-10-CM | POA: Diagnosis not present

## 2022-10-17 DIAGNOSIS — R29898 Other symptoms and signs involving the musculoskeletal system: Secondary | ICD-10-CM | POA: Diagnosis not present

## 2022-10-17 DIAGNOSIS — I952 Hypotension due to drugs: Secondary | ICD-10-CM | POA: Diagnosis not present

## 2022-10-18 DIAGNOSIS — N186 End stage renal disease: Secondary | ICD-10-CM | POA: Diagnosis not present

## 2022-10-18 DIAGNOSIS — Z992 Dependence on renal dialysis: Secondary | ICD-10-CM | POA: Diagnosis not present

## 2022-10-18 DIAGNOSIS — E1122 Type 2 diabetes mellitus with diabetic chronic kidney disease: Secondary | ICD-10-CM | POA: Diagnosis not present

## 2022-10-18 DIAGNOSIS — C61 Malignant neoplasm of prostate: Secondary | ICD-10-CM | POA: Diagnosis not present

## 2022-10-18 DIAGNOSIS — S2241XD Multiple fractures of ribs, right side, subsequent encounter for fracture with routine healing: Secondary | ICD-10-CM | POA: Diagnosis not present

## 2022-10-18 DIAGNOSIS — I120 Hypertensive chronic kidney disease with stage 5 chronic kidney disease or end stage renal disease: Secondary | ICD-10-CM | POA: Diagnosis not present

## 2022-10-19 DIAGNOSIS — N2581 Secondary hyperparathyroidism of renal origin: Secondary | ICD-10-CM | POA: Diagnosis not present

## 2022-10-19 DIAGNOSIS — E1129 Type 2 diabetes mellitus with other diabetic kidney complication: Secondary | ICD-10-CM | POA: Diagnosis not present

## 2022-10-19 DIAGNOSIS — D631 Anemia in chronic kidney disease: Secondary | ICD-10-CM | POA: Diagnosis not present

## 2022-10-19 DIAGNOSIS — N186 End stage renal disease: Secondary | ICD-10-CM | POA: Diagnosis not present

## 2022-10-19 DIAGNOSIS — M14671 Charcot's joint, right ankle and foot: Secondary | ICD-10-CM | POA: Diagnosis not present

## 2022-10-19 DIAGNOSIS — Z992 Dependence on renal dialysis: Secondary | ICD-10-CM | POA: Diagnosis not present

## 2022-10-19 DIAGNOSIS — D509 Iron deficiency anemia, unspecified: Secondary | ICD-10-CM | POA: Diagnosis not present

## 2022-10-22 DIAGNOSIS — Z992 Dependence on renal dialysis: Secondary | ICD-10-CM | POA: Diagnosis not present

## 2022-10-22 DIAGNOSIS — D631 Anemia in chronic kidney disease: Secondary | ICD-10-CM | POA: Diagnosis not present

## 2022-10-22 DIAGNOSIS — N186 End stage renal disease: Secondary | ICD-10-CM | POA: Diagnosis not present

## 2022-10-22 DIAGNOSIS — E1129 Type 2 diabetes mellitus with other diabetic kidney complication: Secondary | ICD-10-CM | POA: Diagnosis not present

## 2022-10-22 DIAGNOSIS — N2581 Secondary hyperparathyroidism of renal origin: Secondary | ICD-10-CM | POA: Diagnosis not present

## 2022-10-22 DIAGNOSIS — D509 Iron deficiency anemia, unspecified: Secondary | ICD-10-CM | POA: Diagnosis not present

## 2022-10-22 NOTE — Telephone Encounter (Signed)
Pt wife is calling wanting to know when prescription will be sent to the pharmacy. She is requesting a call back from nurse.

## 2022-10-23 NOTE — Telephone Encounter (Signed)
Pt wife is calling wanting to know when prescription will be sent to the pharmacy. She is requesting a call back from nurse.

## 2022-10-24 DIAGNOSIS — D509 Iron deficiency anemia, unspecified: Secondary | ICD-10-CM | POA: Diagnosis not present

## 2022-10-24 DIAGNOSIS — D631 Anemia in chronic kidney disease: Secondary | ICD-10-CM | POA: Diagnosis not present

## 2022-10-24 DIAGNOSIS — N186 End stage renal disease: Secondary | ICD-10-CM | POA: Diagnosis not present

## 2022-10-24 DIAGNOSIS — N2581 Secondary hyperparathyroidism of renal origin: Secondary | ICD-10-CM | POA: Diagnosis not present

## 2022-10-24 DIAGNOSIS — Z992 Dependence on renal dialysis: Secondary | ICD-10-CM | POA: Diagnosis not present

## 2022-10-24 DIAGNOSIS — E1129 Type 2 diabetes mellitus with other diabetic kidney complication: Secondary | ICD-10-CM | POA: Diagnosis not present

## 2022-10-24 NOTE — Telephone Encounter (Signed)
Pt's wife called  [phone] wanting to know what the update is on getting these medications filled. Please advise.

## 2022-10-25 DIAGNOSIS — D84821 Immunodeficiency due to drugs: Secondary | ICD-10-CM | POA: Diagnosis not present

## 2022-10-25 DIAGNOSIS — Z48298 Encounter for aftercare following other organ transplant: Secondary | ICD-10-CM | POA: Diagnosis not present

## 2022-10-25 DIAGNOSIS — Z992 Dependence on renal dialysis: Secondary | ICD-10-CM | POA: Diagnosis not present

## 2022-10-25 DIAGNOSIS — Z941 Heart transplant status: Secondary | ICD-10-CM | POA: Diagnosis not present

## 2022-10-25 DIAGNOSIS — Z2989 Encounter for other specified prophylactic measures: Secondary | ICD-10-CM | POA: Diagnosis not present

## 2022-10-25 DIAGNOSIS — D849 Immunodeficiency, unspecified: Secondary | ICD-10-CM | POA: Diagnosis not present

## 2022-10-25 DIAGNOSIS — Z125 Encounter for screening for malignant neoplasm of prostate: Secondary | ICD-10-CM | POA: Diagnosis not present

## 2022-10-25 DIAGNOSIS — G40909 Epilepsy, unspecified, not intractable, without status epilepticus: Secondary | ICD-10-CM | POA: Diagnosis not present

## 2022-10-25 DIAGNOSIS — Z79899 Other long term (current) drug therapy: Secondary | ICD-10-CM | POA: Diagnosis not present

## 2022-10-25 DIAGNOSIS — Z4821 Encounter for aftercare following heart transplant: Secondary | ICD-10-CM | POA: Diagnosis not present

## 2022-10-25 DIAGNOSIS — J189 Pneumonia, unspecified organism: Secondary | ICD-10-CM | POA: Diagnosis not present

## 2022-10-25 DIAGNOSIS — N186 End stage renal disease: Secondary | ICD-10-CM | POA: Diagnosis not present

## 2022-10-25 DIAGNOSIS — I288 Other diseases of pulmonary vessels: Secondary | ICD-10-CM | POA: Diagnosis not present

## 2022-10-25 DIAGNOSIS — R918 Other nonspecific abnormal finding of lung field: Secondary | ICD-10-CM | POA: Diagnosis not present

## 2022-10-25 DIAGNOSIS — M6281 Muscle weakness (generalized): Secondary | ICD-10-CM | POA: Diagnosis not present

## 2022-10-25 NOTE — Telephone Encounter (Signed)
I have reviewed the patient's chart. I do not see where he is currently taking these medications. It appears that at least one of them has been discontinued by another provider. He reported in his last office visit here that Cymbalta was causing dreams. It is not appropriate to refill these medications without an office visit at this time.

## 2022-10-26 DIAGNOSIS — I120 Hypertensive chronic kidney disease with stage 5 chronic kidney disease or end stage renal disease: Secondary | ICD-10-CM | POA: Diagnosis not present

## 2022-10-26 DIAGNOSIS — N2581 Secondary hyperparathyroidism of renal origin: Secondary | ICD-10-CM | POA: Diagnosis not present

## 2022-10-26 DIAGNOSIS — T8620 Unspecified complication of heart transplant: Secondary | ICD-10-CM | POA: Diagnosis not present

## 2022-10-26 DIAGNOSIS — C61 Malignant neoplasm of prostate: Secondary | ICD-10-CM | POA: Diagnosis not present

## 2022-10-26 DIAGNOSIS — D509 Iron deficiency anemia, unspecified: Secondary | ICD-10-CM | POA: Diagnosis not present

## 2022-10-26 DIAGNOSIS — Z992 Dependence on renal dialysis: Secondary | ICD-10-CM | POA: Diagnosis not present

## 2022-10-26 DIAGNOSIS — E1129 Type 2 diabetes mellitus with other diabetic kidney complication: Secondary | ICD-10-CM | POA: Diagnosis not present

## 2022-10-26 DIAGNOSIS — N186 End stage renal disease: Secondary | ICD-10-CM | POA: Diagnosis not present

## 2022-10-26 DIAGNOSIS — E1122 Type 2 diabetes mellitus with diabetic chronic kidney disease: Secondary | ICD-10-CM | POA: Diagnosis not present

## 2022-10-26 DIAGNOSIS — D631 Anemia in chronic kidney disease: Secondary | ICD-10-CM | POA: Diagnosis not present

## 2022-10-26 DIAGNOSIS — S2241XD Multiple fractures of ribs, right side, subsequent encounter for fracture with routine healing: Secondary | ICD-10-CM | POA: Diagnosis not present

## 2022-10-29 DIAGNOSIS — D631 Anemia in chronic kidney disease: Secondary | ICD-10-CM | POA: Diagnosis not present

## 2022-10-29 DIAGNOSIS — N186 End stage renal disease: Secondary | ICD-10-CM | POA: Diagnosis not present

## 2022-10-29 DIAGNOSIS — N2581 Secondary hyperparathyroidism of renal origin: Secondary | ICD-10-CM | POA: Diagnosis not present

## 2022-10-29 DIAGNOSIS — E1129 Type 2 diabetes mellitus with other diabetic kidney complication: Secondary | ICD-10-CM | POA: Diagnosis not present

## 2022-10-29 DIAGNOSIS — D509 Iron deficiency anemia, unspecified: Secondary | ICD-10-CM | POA: Diagnosis not present

## 2022-10-29 DIAGNOSIS — Z992 Dependence on renal dialysis: Secondary | ICD-10-CM | POA: Diagnosis not present

## 2022-11-01 ENCOUNTER — Ambulatory Visit (INDEPENDENT_AMBULATORY_CARE_PROVIDER_SITE_OTHER): Payer: Medicare Other | Admitting: Neurology

## 2022-11-01 ENCOUNTER — Encounter: Payer: Self-pay | Admitting: Neurology

## 2022-11-01 VITALS — BP 106/70 | HR 84 | Ht 78.0 in | Wt 310.0 lb

## 2022-11-01 DIAGNOSIS — E1143 Type 2 diabetes mellitus with diabetic autonomic (poly)neuropathy: Secondary | ICD-10-CM | POA: Diagnosis not present

## 2022-11-01 DIAGNOSIS — I951 Orthostatic hypotension: Secondary | ICD-10-CM | POA: Diagnosis not present

## 2022-11-01 DIAGNOSIS — R269 Unspecified abnormalities of gait and mobility: Secondary | ICD-10-CM | POA: Diagnosis not present

## 2022-11-01 DIAGNOSIS — E1142 Type 2 diabetes mellitus with diabetic polyneuropathy: Secondary | ICD-10-CM | POA: Diagnosis not present

## 2022-11-01 DIAGNOSIS — I639 Cerebral infarction, unspecified: Secondary | ICD-10-CM

## 2022-11-01 DIAGNOSIS — K648 Other hemorrhoids: Secondary | ICD-10-CM | POA: Diagnosis not present

## 2022-11-01 DIAGNOSIS — K5900 Constipation, unspecified: Secondary | ICD-10-CM | POA: Diagnosis not present

## 2022-11-01 MED ORDER — DULOXETINE HCL 60 MG PO CPEP: 60.0000 mg | ORAL_CAPSULE | Freq: Every day | ORAL | 3 refills | Status: DC

## 2022-11-01 NOTE — Progress Notes (Signed)
Chief Complaint  Patient presents with   Follow-up    Rm 14. Patient is new consult for memory. Patient is with wife.       ASSESSMENT AND PLAN  [name] is a 62 y.o. male  Confusion Body Jerking  MoCA examination is 11/30 today  Personally reviewed CT head from October 2023, MRI of the brain from March 2020, moderate generalized atrophy, mild small vessel disease, evidence of right MCA stroke at the right parietal region  Creatinine level was 7  His confusion are likely multifactorial, long-term illness, depression, multiple vascular risk factor, end-stage renal disease, a component of central nervous system degenerative disorder as well, medication side effect  His observed body jerking movement most consistent with myoclonic jerking due to his metabolic disarrangement,  At risk for seizure due to right MCA cortical stroke, EEG  He was put on Depakote 250/500 mg daily for his agitation,  If EEG showed no significant abnormality, will continue current medications, family return to clinic for new issues, if the EEG showed concern, will return to clinic to address,   DIAGNOSTIC DATA (LABS, IMAGING, TESTING) - I reviewed patient records, labs, notes, testing and imaging myself where available.   MEDICAL HISTORY:  [name], is a 62 year old male, seen in request by his primary care physician Dr.   Tamala [name], [name] for evaluation of worsening memory loss, confusion, body jerking movement, he is accompanied by his wife and daughter at today's clinical visit on November 01, 2022,  I reviewed and summarized the referring note. PMHX. End-stage renal disease has been on dialysis since 2014, MWF, Hypertension Hyperlipidemia Prostate cancer, radiation therapy History of heart transplant--- at [facility] in 2012, Diabetes, with neuropathy Cardiomyopathy CHF Atrial fibrillation--on chronic anticoagulation Osteoarthritis of right hip Stroke  He has a very complicated past medical  history, including history of heart transplant in 2012, also end-stage renal disease, has been on dialysis since 2014, most recent creatinine was 7  He gets dialysis 3 times a week, Monday Wednesday and Friday, and those days he get up at 4 AM, wiped out after dialysis, rest of the week, he spent most of the time in bed,  He had a history of right ankle fracture, then right Charcot foot, deformity, dense bilateral lower extremity weakness, numbness, could no longer bear weight, needing help transfer,  Hospital admission November 2023 for worsening confusion, hallucinations, he was diagnosed with metabolic encephalopathy, secondary polypharmacy, end-stage renal disease, med adjustment, try to cut back narcotics, also neuropathic pain medication such as Lyrica, Trileptal, Robaxin,  Since that hospital admission, with medication change, his mental status changed some, last hallucination, stay awake a bit longer, but continues to have slow worsening memory loss, over time,  Since November 2023, he was also noted to have intermittent upper extremity, quick jerking movement,there was no staring spells or clinical seizure activity noted  Personally reviewed MRI of the brain in 2020, chronic right MCA stroke, encephalomalacia in the right frontal lobe, microhemorrhage  Laboratory evaluation in February 2024, vitamin D level was 19.6, abnormal creatinine 7.02, normal TSH 1.97 B12 1326,  PHYSICAL EXAM:   Vitals:   11/01/22 0854  BP: 106/70  Pulse: 84  Weight: (!) 310 lb (140.6 kg)  Height: '6\' 6"'$  (1.981 m)   Not recorded     Body mass index is 35.82 kg/m.  PHYSICAL EXAMNIATION:  Gen: NAD, conversant, well nourised, well groomed  Cardiovascular: Regular rate rhythm, no peripheral edema, warm, nontender. Eyes: Conjunctivae clear without exudates or hemorrhage Neck: Supple, no carotid bruits. Pulmonary: Clear to auscultation bilaterally   NEUROLOGICAL EXAM:  MENTAL  STATUS: Speech/cognition: Sitting in wheelchair, big body habitus, frequent upper extremity myoclonic jerking movement    11/01/2022    8:58 AM  Montreal Cognitive Assessment   Visuospatial/ Executive (0/5) 0  Naming (0/3) 3  Attention: Read list of digits (0/2) 1  Attention: Read list of letters (0/1) 1  Attention: Serial 7 subtraction starting at 100 (0/3) 0  Language: Repeat phrase (0/2) 0  Language : Fluency (0/1) 0  Abstraction (0/2) 1  Delayed Recall (0/5) 3  Orientation (0/6) 2  Total 11  Adjusted Score (based on education) 11    CRANIAL NERVES: CN II: Visual fields are full to confrontation. Pupils are round equal and briskly reactive to light. CN III, IV, VI: extraocular movement are normal. No ptosis. CN V: Facial sensation is intact to light touch CN VII: Face is symmetric with normal eye closure  CN VIII: Hearing is normal to causal conversation. CN IX, X: Phonation is normal. CN XI: Head turning and shoulder shrug are intact  MOTOR: Left hand muscle moderate atrophy, moderate left finger abduction weakness, examination is limited because of his confusion, may have mild bilateral hip flexion weakness, moderate bilateral ankle dorsiflexion weakness,  REFLEXES: Hyperreflexia, plantar responses were mute  SENSORY: Not reliable, seems to have length dependent decreased to light touch pinprick at least to knee level  COORDINATION: There is no trunk or limb dysmetria noted.  GAIT/STANCE: Deferred  REVIEW OF SYSTEMS:  Full 14 system review of systems performed and notable only for as above All other review of systems were negative.   ALLERGIES: Allergies  Allergen Reactions   Heparin Nausea Only, Swelling and Other (See Comments)    * * HIT * *  SWELLING REACTION UNSPECIFIED   DIAPHORESIS    Iodinated Contrast Media    Penicillin G Potassium [Penicillin G] Nausea Only and Other (See Comments)    DIAPHORESIS    HOME MEDICATIONS: Current Outpatient  Medications  Medication Sig Dispense Refill   acetaminophen (TYLENOL) 325 MG tablet Take 2 tablets (650 mg total) by mouth every 6 (six) hours.     citalopram (CELEXA) 20 MG tablet Take 20 mg by mouth daily.     cycloSPORINE modified (NEORAL) 100 MG capsule Take 100 mg by mouth See admin instructions. Take with three 25 mg  (75 ) for a total of 175 mg twice a day     cycloSPORINE modified (NEORAL) 25 MG capsule Take 75 mg by mouth See admin instructions. Take With 100 mg tab for a total of 175 mg twice a day     DULoxetine (CYMBALTA) 60 MG capsule Take 60 mg by mouth daily.     ELIQUIS 2.5 MG TABS tablet Take 1 tablet (2.5 mg total) by mouth 2 (two) times daily. 60 tablet    lidocaine (LIDODERM) 5 % Place 1 patch onto the skin daily. Remove & Discard patch within 12 hours or as directed by MD 12 patch 0   lidocaine (XYLOCAINE) 5 % ointment Apply 1 application topically as needed. 35.44 g 0   methocarbamol (ROBAXIN) 500 MG tablet Take 1 tablet (500 mg total) by mouth every 8 (eight) hours as needed for muscle spasms. 25 tablet 0   midodrine (PROAMATINE) 10 MG tablet Take 1 tablet (10 mg total) by mouth 3 (three) times daily.  mycophenolate (MYFORTIC) 180 MG EC tablet Take 720 mg by mouth 2 (two) times daily.     ondansetron (ZOFRAN) 4 MG tablet Take 1 tablet (4 mg total) by mouth every 6 (six) hours as needed for nausea. 20 tablet 0   oxybutynin (DITROPAN) 5 MG tablet Take 1 tablet (5 mg total) by mouth every 8 (eight) hours as needed for bladder spasms. 10 tablet 0   oxyCODONE (OXY IR/ROXICODONE) 5 MG immediate release tablet Take 1 tablet (5 mg total) by mouth every 4 (four) hours as needed for moderate pain or severe pain ('5mg'$  moderate, '10mg'$  severe). 30 tablet 0   pregabalin (LYRICA) 300 MG capsule Take 1 capsule (300 mg total) by mouth daily.     rosuvastatin (CRESTOR) 10 MG tablet Take 1 tablet (10 mg total) by mouth daily. 30 tablet 1   sevelamer carbonate (RENVELA) 800 MG tablet Take 2  tablets (1,600 mg total) by mouth 3 (three) times daily with meals. 120 tablet 0   cinacalcet (SENSIPAR) 60 MG tablet Take 60 mg by mouth daily. (Patient not taking: Reported on 07/04/2022)     lidocaine-prilocaine (EMLA) cream Apply 1 application topically as needed. (Patient not taking: Reported on 07/04/2022)     No current facility-administered medications for this visit.    PAST MEDICAL HISTORY: Past Medical History:  Diagnosis Date   Arthritis    Atrial fibrillation (HCC)    CHF (congestive heart failure), NYHA class III (HCC)    s/p heart transplant   Chronic kidney disease    end stage   Chronic systolic dysfunction of left ventricle    CVA (cerebral infarction)    Diabetes mellitus    PMH; Prior to heart transplant   Family history of coronary artery disease    in both parents   GERD (gastroesophageal reflux disease)    Hyperlipidemia    Hypertension    Left bundle branch block    Morbid obesity (HCC)    status post lap band   Myocardial infarction [facility])    prior to heart transplant   Nonischemic cardiomyopathy ([place])    prior to heart transplant   Obesity (BMI 30-39.9)    Obstructive sleep apnea    no longer needs CPAP after heart transplant per pt   Premature ventricular contractions    Prostate cancer ([place])    SOB (shortness of breath)     PAST SURGICAL HISTORY: Past Surgical History:  Procedure Laterality Date   CARDIAC PACEMAKER PLACEMENT  09/21/2009   Biventricular implantable cardioverter-defibrillator implantation      COLONOSCOPY W/ BIOPSIES AND POLYPECTOMY     CYSTOSCOPY WITH FULGERATION N/A 04/27/2021   Procedure: CYSTOSCOPY AND CLOT EVACUATION WITH BLADDER BIOPSY/ FUGARATION OF BLADDER/ FULGARATION OF PROSTATE ;  Surgeon: [name], MD;  Location: [facility];  Service: Urology;  Laterality: N/A;   FOOT SURGERY     left   HEART TRANSPLANT  2014   LAPAROSCOPIC GASTRIC BANDING  01/27/2007   LEFT VENTRICULAR ASSIST DEVICE     implanted at [place] Right 07/22/2017   Procedure: RIGHT TOTAL HIP ARTHROPLASTY ANTERIOR APPROACH;  Surgeon: Rod Can, MD;  Location: [facility];  Service: Orthopedics;  Laterality: Right;  Needs RNFA   TOTAL KNEE ARTHROPLASTY Right 07/22/2017    FAMILY HISTORY: Family History  Problem Relation Age of Onset   Coronary artery disease Father  had, PTCA & CABG   Heart attack Father    Hypertension Father    Diabetes Father    Prostate cancer Father    Coronary artery disease Mother    Heart attack Mother    Hypertension Mother    Stroke Mother    Lupus Mother    Prostate cancer Brother    Coronary artery disease Brother    Coronary artery disease Sister    Heart attack Sister    Prostate cancer Paternal Uncle    Coronary artery disease Maternal Grandmother    Coronary artery disease Maternal Grandfather    Coronary artery disease Paternal Grandmother    Coronary artery disease Paternal Grandfather     SOCIAL HISTORY: Social History   Socioeconomic History   Marital status: Married    Spouse name: Sales executive   Number of children: 1   Years of education: 14   Highest education level: Not on file  Occupational History   Occupation: retired    Fish farm manager: [employer]  Tobacco Use   Smoking status: Never   Smokeless tobacco: Former    Types: Snuff  Vaping Use   Vaping Use: Never used  Substance and Sexual Activity   Alcohol use: No   Drug use: No   Sexual activity: Not Currently  Other Topics Concern   Not on file  Social History Narrative   Lives with wife in a one story home.  Has one child.     Retired from the [employer].  Supervisor over transportation.    Education: college.    Social Determinants of Health   Financial Resource Strain: Not on file  Food Insecurity: No Food Insecurity (07/05/2022)   Hunger Vital Sign    Worried About Running Out of Food in the Last Year: Never true    Ran  Out of Food in the Last Year: Never true  Transportation Needs: No Transportation Needs (07/05/2022)   PRAPARE - Hydrologist (Medical): No    Lack of Transportation (Non-Medical): No  Physical Activity: Not on file  Stress: Not on file  Social Connections: Not on file  Intimate Partner Violence: Not At Risk (07/05/2022)   Humiliation, Afraid, Rape, and Kick questionnaire    Fear of Current or Ex-Partner: No    Emotionally Abused: No    Physically Abused: No    Sexually Abused: No      Marcial Pacas, M.D. Ph.D.  [facility] [address] [place], [place] [address] Ph: [phone] Fax: [phone]  CC:  Carol Ada, [place],  [place] [address]  Carol Ada, MD

## 2022-11-02 DIAGNOSIS — D509 Iron deficiency anemia, unspecified: Secondary | ICD-10-CM | POA: Diagnosis not present

## 2022-11-02 DIAGNOSIS — S2241XD Multiple fractures of ribs, right side, subsequent encounter for fracture with routine healing: Secondary | ICD-10-CM | POA: Diagnosis not present

## 2022-11-02 DIAGNOSIS — D631 Anemia in chronic kidney disease: Secondary | ICD-10-CM | POA: Diagnosis not present

## 2022-11-02 DIAGNOSIS — N186 End stage renal disease: Secondary | ICD-10-CM | POA: Diagnosis not present

## 2022-11-02 DIAGNOSIS — I120 Hypertensive chronic kidney disease with stage 5 chronic kidney disease or end stage renal disease: Secondary | ICD-10-CM | POA: Diagnosis not present

## 2022-11-02 DIAGNOSIS — E1122 Type 2 diabetes mellitus with diabetic chronic kidney disease: Secondary | ICD-10-CM | POA: Diagnosis not present

## 2022-11-02 DIAGNOSIS — C61 Malignant neoplasm of prostate: Secondary | ICD-10-CM | POA: Diagnosis not present

## 2022-11-02 DIAGNOSIS — N2581 Secondary hyperparathyroidism of renal origin: Secondary | ICD-10-CM | POA: Diagnosis not present

## 2022-11-02 DIAGNOSIS — Z992 Dependence on renal dialysis: Secondary | ICD-10-CM | POA: Diagnosis not present

## 2022-11-02 DIAGNOSIS — E1129 Type 2 diabetes mellitus with other diabetic kidney complication: Secondary | ICD-10-CM | POA: Diagnosis not present

## 2022-11-03 DIAGNOSIS — D509 Iron deficiency anemia, unspecified: Secondary | ICD-10-CM | POA: Diagnosis not present

## 2022-11-03 DIAGNOSIS — N186 End stage renal disease: Secondary | ICD-10-CM | POA: Diagnosis not present

## 2022-11-03 DIAGNOSIS — Z992 Dependence on renal dialysis: Secondary | ICD-10-CM | POA: Diagnosis not present

## 2022-11-03 DIAGNOSIS — D631 Anemia in chronic kidney disease: Secondary | ICD-10-CM | POA: Diagnosis not present

## 2022-11-03 DIAGNOSIS — N2581 Secondary hyperparathyroidism of renal origin: Secondary | ICD-10-CM | POA: Diagnosis not present

## 2022-11-03 DIAGNOSIS — E1129 Type 2 diabetes mellitus with other diabetic kidney complication: Secondary | ICD-10-CM | POA: Diagnosis not present

## 2022-11-05 DIAGNOSIS — Z992 Dependence on renal dialysis: Secondary | ICD-10-CM | POA: Diagnosis not present

## 2022-11-05 DIAGNOSIS — N2581 Secondary hyperparathyroidism of renal origin: Secondary | ICD-10-CM | POA: Diagnosis not present

## 2022-11-05 DIAGNOSIS — E1129 Type 2 diabetes mellitus with other diabetic kidney complication: Secondary | ICD-10-CM | POA: Diagnosis not present

## 2022-11-05 DIAGNOSIS — N186 End stage renal disease: Secondary | ICD-10-CM | POA: Diagnosis not present

## 2022-11-05 DIAGNOSIS — D631 Anemia in chronic kidney disease: Secondary | ICD-10-CM | POA: Diagnosis not present

## 2022-11-05 DIAGNOSIS — D509 Iron deficiency anemia, unspecified: Secondary | ICD-10-CM | POA: Diagnosis not present

## 2022-11-06 DIAGNOSIS — E119 Type 2 diabetes mellitus without complications: Secondary | ICD-10-CM | POA: Diagnosis not present

## 2022-11-06 DIAGNOSIS — H2513 Age-related nuclear cataract, bilateral: Secondary | ICD-10-CM | POA: Diagnosis not present

## 2022-11-07 DIAGNOSIS — D631 Anemia in chronic kidney disease: Secondary | ICD-10-CM | POA: Diagnosis not present

## 2022-11-07 DIAGNOSIS — E1129 Type 2 diabetes mellitus with other diabetic kidney complication: Secondary | ICD-10-CM | POA: Diagnosis not present

## 2022-11-07 DIAGNOSIS — N2581 Secondary hyperparathyroidism of renal origin: Secondary | ICD-10-CM | POA: Diagnosis not present

## 2022-11-07 DIAGNOSIS — N186 End stage renal disease: Secondary | ICD-10-CM | POA: Diagnosis not present

## 2022-11-07 DIAGNOSIS — Z992 Dependence on renal dialysis: Secondary | ICD-10-CM | POA: Diagnosis not present

## 2022-11-07 DIAGNOSIS — D509 Iron deficiency anemia, unspecified: Secondary | ICD-10-CM | POA: Diagnosis not present

## 2022-11-08 ENCOUNTER — Ambulatory Visit (INDEPENDENT_AMBULATORY_CARE_PROVIDER_SITE_OTHER): Payer: Medicare Other | Admitting: Neurology

## 2022-11-08 DIAGNOSIS — I639 Cerebral infarction, unspecified: Secondary | ICD-10-CM

## 2022-11-08 DIAGNOSIS — R569 Unspecified convulsions: Secondary | ICD-10-CM | POA: Diagnosis not present

## 2022-11-08 DIAGNOSIS — E1143 Type 2 diabetes mellitus with diabetic autonomic (poly)neuropathy: Secondary | ICD-10-CM

## 2022-11-08 DIAGNOSIS — R269 Unspecified abnormalities of gait and mobility: Secondary | ICD-10-CM

## 2022-11-08 DIAGNOSIS — I951 Orthostatic hypotension: Secondary | ICD-10-CM

## 2022-11-08 DIAGNOSIS — E1142 Type 2 diabetes mellitus with diabetic polyneuropathy: Secondary | ICD-10-CM

## 2022-11-09 DIAGNOSIS — D509 Iron deficiency anemia, unspecified: Secondary | ICD-10-CM | POA: Diagnosis not present

## 2022-11-09 DIAGNOSIS — D631 Anemia in chronic kidney disease: Secondary | ICD-10-CM | POA: Diagnosis not present

## 2022-11-09 DIAGNOSIS — N186 End stage renal disease: Secondary | ICD-10-CM | POA: Diagnosis not present

## 2022-11-09 DIAGNOSIS — N2581 Secondary hyperparathyroidism of renal origin: Secondary | ICD-10-CM | POA: Diagnosis not present

## 2022-11-09 DIAGNOSIS — E1122 Type 2 diabetes mellitus with diabetic chronic kidney disease: Secondary | ICD-10-CM | POA: Diagnosis not present

## 2022-11-09 DIAGNOSIS — E1129 Type 2 diabetes mellitus with other diabetic kidney complication: Secondary | ICD-10-CM | POA: Diagnosis not present

## 2022-11-09 DIAGNOSIS — S2241XD Multiple fractures of ribs, right side, subsequent encounter for fracture with routine healing: Secondary | ICD-10-CM | POA: Diagnosis not present

## 2022-11-09 DIAGNOSIS — I120 Hypertensive chronic kidney disease with stage 5 chronic kidney disease or end stage renal disease: Secondary | ICD-10-CM | POA: Diagnosis not present

## 2022-11-09 DIAGNOSIS — Z992 Dependence on renal dialysis: Secondary | ICD-10-CM | POA: Diagnosis not present

## 2022-11-09 DIAGNOSIS — C61 Malignant neoplasm of prostate: Secondary | ICD-10-CM | POA: Diagnosis not present

## 2022-11-12 DIAGNOSIS — D508 Other iron deficiency anemias: Secondary | ICD-10-CM | POA: Diagnosis not present

## 2022-11-12 DIAGNOSIS — Z6829 Body mass index (BMI) 29.0-29.9, adult: Secondary | ICD-10-CM | POA: Diagnosis not present

## 2022-11-12 DIAGNOSIS — N2581 Secondary hyperparathyroidism of renal origin: Secondary | ICD-10-CM | POA: Diagnosis not present

## 2022-11-12 DIAGNOSIS — Z0130 Encounter for examination of blood pressure without abnormal findings: Secondary | ICD-10-CM | POA: Diagnosis not present

## 2022-11-12 DIAGNOSIS — E1129 Type 2 diabetes mellitus with other diabetic kidney complication: Secondary | ICD-10-CM | POA: Diagnosis not present

## 2022-11-12 DIAGNOSIS — M129 Arthropathy, unspecified: Secondary | ICD-10-CM | POA: Diagnosis not present

## 2022-11-12 DIAGNOSIS — N186 End stage renal disease: Secondary | ICD-10-CM | POA: Diagnosis not present

## 2022-11-12 DIAGNOSIS — Z992 Dependence on renal dialysis: Secondary | ICD-10-CM | POA: Diagnosis not present

## 2022-11-12 DIAGNOSIS — F339 Major depressive disorder, recurrent, unspecified: Secondary | ICD-10-CM | POA: Diagnosis not present

## 2022-11-12 DIAGNOSIS — M5030 Other cervical disc degeneration, unspecified cervical region: Secondary | ICD-10-CM | POA: Diagnosis not present

## 2022-11-12 DIAGNOSIS — M4802 Spinal stenosis, cervical region: Secondary | ICD-10-CM | POA: Diagnosis not present

## 2022-11-12 DIAGNOSIS — G629 Polyneuropathy, unspecified: Secondary | ICD-10-CM | POA: Diagnosis not present

## 2022-11-12 DIAGNOSIS — M549 Dorsalgia, unspecified: Secondary | ICD-10-CM | POA: Diagnosis not present

## 2022-11-12 DIAGNOSIS — Z79899 Other long term (current) drug therapy: Secondary | ICD-10-CM | POA: Diagnosis not present

## 2022-11-12 DIAGNOSIS — D509 Iron deficiency anemia, unspecified: Secondary | ICD-10-CM | POA: Diagnosis not present

## 2022-11-12 DIAGNOSIS — D631 Anemia in chronic kidney disease: Secondary | ICD-10-CM | POA: Diagnosis not present

## 2022-11-12 DIAGNOSIS — E538 Deficiency of other specified B group vitamins: Secondary | ICD-10-CM | POA: Diagnosis not present

## 2022-11-12 NOTE — Procedures (Signed)
   HISTORY: 62 year old male with history of end-stage renal disease, polypharmacy treatment presenting with increased confusion body jerking movement  TECHNIQUE:  This is a routine 16 channel EEG recording with one channel devoted to a limited EKG recording.  It was performed during wakefulness, drowsiness and asleep. Photic stimulation were performed as activating procedures.  There are minimum muscle and movement artifact noted.  Upon maximum arousal, posterior dominant waking rhythm consistent of dysrhythmic low amplitude theta range activity. Activities are symmetric over the bilateral posterior derivations and attenuated with eye opening.  Hyperventilation was not performed  Photic stimulation did not alter the tracing.  During EEG recording, patient developed drowsiness and no deeper stage of sleep was achieved  During EEG recording, there was no epileptiform discharge noted.  EKG demonstrate normal sinus rhythm.  CONCLUSION: This is an abnormal EEG.  There is evidence of generalized moderate slowing, indicating bihemispheric malfunction,, etiology or metabolic toxic.  Marcial Pacas, M.D. Ph.D.  [facility] [place], [place] [address] Phone: [phone] Fax:      [phone]

## 2022-11-13 ENCOUNTER — Ambulatory Visit: Payer: Medicare Other | Attending: Cardiology

## 2022-11-13 DIAGNOSIS — R2681 Unsteadiness on feet: Secondary | ICD-10-CM | POA: Diagnosis not present

## 2022-11-13 DIAGNOSIS — R293 Abnormal posture: Secondary | ICD-10-CM | POA: Diagnosis not present

## 2022-11-13 DIAGNOSIS — R5381 Other malaise: Secondary | ICD-10-CM | POA: Insufficient documentation

## 2022-11-13 DIAGNOSIS — Z9181 History of falling: Secondary | ICD-10-CM | POA: Diagnosis not present

## 2022-11-13 DIAGNOSIS — M6281 Muscle weakness (generalized): Secondary | ICD-10-CM | POA: Diagnosis not present

## 2022-11-13 DIAGNOSIS — R2689 Other abnormalities of gait and mobility: Secondary | ICD-10-CM | POA: Insufficient documentation

## 2022-11-13 NOTE — Therapy (Signed)
OUTPATIENT PHYSICAL THERAPY NEURO EVALUATION   Patient Name: [name] MRN: [id number] DOB:October 13, 1960, 62 y.o., male Today's Date: 11/13/2022   PCP: Carol Ada REFERRING PROVIDER: Tylene Fantasia  END OF SESSION:  PT End of Session - 11/13/22 1507     Visit Number 1    Date for PT Re-Evaluation 02/05/23    Authorization Type Medicare    PT Start Time 1500    PT Stop Time [id number]    PT Time Calculation (min) 45 min             Past Medical History:  Diagnosis Date   Arthritis    Atrial fibrillation (HCC)    CHF (congestive heart failure), NYHA class III ([place])    s/p heart transplant   Chronic kidney disease    end stage   Chronic systolic dysfunction of left ventricle    CVA (cerebral infarction)    Diabetes mellitus    PMH; Prior to heart transplant   Family history of coronary artery disease    in both parents   GERD (gastroesophageal reflux disease)    Hyperlipidemia    Hypertension    Left bundle branch block    Morbid obesity ([place])    status post lap band   Myocardial infarction [facility])    prior to heart transplant   Nonischemic cardiomyopathy ([place])    prior to heart transplant   Obesity (BMI 30-39.9)    Obstructive sleep apnea    no longer needs CPAP after heart transplant per pt   Premature ventricular contractions    Prostate cancer (HCC)    SOB (shortness of breath)    Past Surgical History:  Procedure Laterality Date   CARDIAC PACEMAKER PLACEMENT  09/21/2009   Biventricular implantable cardioverter-defibrillator implantation      COLONOSCOPY W/ BIOPSIES AND POLYPECTOMY     CYSTOSCOPY WITH FULGERATION N/A 04/27/2021   Procedure: CYSTOSCOPY AND CLOT EVACUATION WITH BLADDER BIOPSY/ FUGARATION OF BLADDER/ FULGARATION OF PROSTATE ;  Surgeon: [name], MD;  Location: [facility];  Service: Urology;  Laterality: N/A;   FOOT SURGERY     left   HEART TRANSPLANT  2014   LAPAROSCOPIC GASTRIC BANDING  01/27/2007   LEFT VENTRICULAR ASSIST DEVICE      implanted at [place] Right 07/22/2017   Procedure: RIGHT TOTAL HIP ARTHROPLASTY ANTERIOR APPROACH;  Surgeon: Rod Can, MD;  Location: [place];  Service: Orthopedics;  Laterality: Right;  Needs RNFA   TOTAL KNEE ARTHROPLASTY Right 07/22/2017   Patient Active Problem List   Diagnosis Date Noted   Peripheral autonomic neuropathy due to DM ([place]) 11/01/2022   Orthostatic hypotension 11/01/2022   Acute encephalopathy 07/04/2022   Chronic kidney disease, stage 5 ([place]) 11/14/2021   Gait abnormality 10/12/2021   Polyneuropathy associated with underlying disease ([place]) 10/12/2021   Neuropathic pain 10/12/2021   Morbid obesity ([place]) 11/19/2019   Sleep disorder 10/22/2019   Sleep disturbance 10/22/2019   Abnormality of gait 06/09/2019   Diabetic polyneuropathy associated with diabetes mellitus due to underlying condition ([place]) 04/14/2019   Peripheral neuropathy 12/11/2018   CVA (cerebral vascular accident) ([place]) 11/09/2018   H/O heart transplant ([place]) 11/09/2018   Malignant neoplasm of prostate ([place]) 05/06/2018   Closed posterior dislocation of hip, right, initial encounter ([place]) 08/24/2017   End stage renal disease on dialysis ([place]) 08/24/2017   Hip dislocation, right ([place]) 08/24/2017   Osteoarthritis of  right hip 07/22/2017   VENTRICULAR TACHYCARDIA 12/21/2009   Dyslipidemia 08/30/2009   Essential hypertension 08/30/2009   ISCHEMIC CARDIOMYOPATHY 08/30/2009   Atrial fibrillation ([place]) 08/30/2009   VENTRICULAR ECTOPY 08/30/2009   History of CVA (cerebrovascular accident) 08/30/2009   Obesity, Class III, BMI 40-49.9 (morbid obesity) ([place]) 08/30/2009   DM type 2 with diabetic peripheral neuropathy ([place]) 10/17/2007   OBSTRUCTIVE SLEEP APNEA 10/17/2007   CARDIOMYOPATHY, DILATED 10/17/2007    ONSET DATE: 10/31/22  REFERRING DIAG:  D84.9 (ICD-10-CM) - Immunodeficiency, unspecified Z94.1 (ICD-10-CM) - Heart transplant  status Z48.298 (ICD-10-CM) - Encounter for aftercare following other organ transplant Z79.899 (ICD-10-CM) - Other long term (current) drug therapy G20.C (ICD-10-CM) - Parkinsonism, unspecified  THERAPY DIAG:  Abnormal posture  Muscle weakness (generalized)  Other abnormalities of gait and mobility  Unsteadiness on feet  History of falling  Physical deconditioning  Rationale for Evaluation and Treatment: Rehabilitation  SUBJECTIVE:                                                                                                                                                                                             SUBJECTIVE STATEMENT: Wife is here for him. Mentioned he has some dementia. Has Charcot on R foot and broke his ankle 2ish years ago. He fractured it again and had been non-weighbearing for months. Were at a point where he can't transfer or do ADLs on his own. He has atrophied a lot and is really weak now. After a fall in October things went downhill, he got really sick and then all the dementia stuff started too.  Pt accompanied by: significant other  PERTINENT HISTORY: heart transplant 7 years ago  On dialysis MWF, heart transplant 6 yrs ago, ESRD, prostate CA, OSA, obesity, CHF, GERD, had DM, CVA with residual left hand weakness, peripheral neuropathy, R THA, AFib   PAIN:  Are you having pain? No  PRECAUTIONS: None  WEIGHT BEARING RESTRICTIONS: No  FALLS: Has patient fallen in last 6 months? Yes. Number of falls more than 10  LIVING ENVIRONMENT: Lives with: lives with their family Lives in: House/apartment Stairs: No Has following equipment at home: Environmental consultant - 2 wheeled, Wheelchair (manual), shower chair, and Grab bars  PLOF: Independent with household mobility with device and Independent with community mobility with device  PATIENT GOALS: want to be able to get him to stand and be more mobile and build his strength  OBJECTIVE:   COGNITION: Overall  cognitive status: Impaired and History of cognitive impairments - at baseline   SENSATION: WFL   POSTURE: rounded shoulders, forward head, increased thoracic kyphosis, and flexed trunk  LOWER EXTREMITY ROM:   limited with all motions due to weakness   LOWER EXTREMITY MMT:    MMT Right Eval Left Eval  Hip flexion 2- 2-  Hip extension    Hip abduction 2+ 2+  Hip adduction 2+ 2+  Hip internal rotation    Hip external rotation    Knee flexion    Knee extension 2- 2+  Ankle dorsiflexion 3+ 3+  Ankle plantarflexion    Ankle inversion    Ankle eversion    (Blank rows = not tested)  TRANSFERS: Assistive device utilized: Environmental consultant - 2 wheeled  Sit to stand: Max A  GAIT: Level of assistance: Max A as wife reports Comments: unable to assess. Wife reports he takes a few steps in the room with walker  FUNCTIONAL TESTS:  5 times sit to stand: unable to do    TODAY'S TREATMENT:                                                                                                                              DATE: 11/13/22-EVAL    PATIENT EDUCATION: Education details: POC and HEP  Person educated: Patient and Spouse Education method: Explanation Education comprehension: verbalized understanding  HOME EXERCISE PROGRAM: Access Code: [email or web address] URL: [email or web address] Date: 11/13/2022 Prepared by: Andris Baumann  Exercises - Seated March  - 1 x daily - 7 x weekly - 2 sets - 10 reps - Seated Knee Extension with Anchored Resistance  - 1 x daily - 7 x weekly - 2 sets - 10 reps - Seated Hip Abduction with Resistance  - 1 x daily - 7 x weekly - 2 sets - 10 reps - Seated Hip Adduction Isometrics with Ball  - 1 x daily - 7 x weekly - 2 sets - 10 reps - Seated Shoulder Horizontal Abduction with Resistance  - 1 x daily - 7 x weekly - 2 sets - 10 reps  GOALS: Goals reviewed with patient? No  SHORT TERM GOALS: Target date: 12/25/22  Patient will be independent with initial  HEP. Goal status: INITIAL  2.  Patient will demonstrate improved sitting posture in wheelchair Baseline: increased kyphosis and flexed trunk Goal status: INITIAL  3.  Patient will be modA with transfers Baseline: maxA Goal status: INITIAL   LONG TERM GOALS: Target date: 02/05/23  Patient will be independent with advanced/ongoing HEP to improve outcomes and carryover.  Goal status: INITIAL  2.  Patient will demonstrate improved functional LE strength as demonstrated by 4 or better. Baseline: 2/5  Goal status: INITIAL  3.  Patient will be independent with sit to stand from wheelchair Baseline: maxA Goal status: INITIAL  4.  Patient will be able to take at least 5 steps with rolling walker minA Baseline: per wife can take a few steps with modA Goal status: INITIAL   ASSESSMENT:  CLINICAL IMPRESSION: Patient is a 62 y.o. male who was seen today for physical therapy evaluation and treatment  for deconditioning and weakness. His wife was here for his subjective report. He has a long extensive medical history. She also states he suffers from dementia that has been progressively worse. He had a long hospital stay late last year and has been declining since. He is currently wheelchair bound and requires max assist to do transfers and complete his ADLs including dressing and toileting. He is very weak in his legs and unable to complete a full sit to stand. He can push up with his arms enough to get him bottom off the chair but cannot stand up. Wife reports he has had a number or falls but patient does not recall any of them. Wife would like to work on his strength to at least be a little bit more mobile as she is his caretaker and would like to find ways to make it easier for her. Patient will benefit from skilled PT to address his deficits to be able to improve his functional mobility and increase his independence.   OBJECTIVE IMPAIRMENTS: decreased activity tolerance, decreased coordination,  decreased endurance, decreased knowledge of condition, decreased mobility, difficulty walking, decreased strength, improper body mechanics, postural dysfunction, and obesity.   ACTIVITY LIMITATIONS: sitting, standing, squatting, stairs, transfers, bed mobility, bathing, toileting, dressing, hygiene/grooming, and locomotion level  PERSONAL FACTORS: Age, Behavior pattern, Fitness, Past/current experiences, Time since onset of injury/illness/exacerbation, and 3+ comorbidities: hx of stroke, heart and kidney transplant, hx of falls, dementia, DM, obesity, ESRD, Afib  are also affecting patient's functional outcome.   REHAB POTENTIAL: Fair    CLINICAL DECISION MAKING: Evolving/moderate complexity  EVALUATION COMPLEXITY: Moderate   PLAN:  PT FREQUENCY: 2x/week  PT DURATION: 12 weeks  PLANNED INTERVENTIONS: Therapeutic exercises, Therapeutic activity, Neuromuscular re-education, Balance training, Gait training, Patient/Family education, Self Care, Joint mobilization, Stair training, Wheelchair mobility training, Cryotherapy, Moist heat, and Manual therapy  PLAN FOR NEXT SESSION: strengthening in wheelchair for LE and UE, can work on transfers    [employer], PT 11/13/2022, 4:31 PM

## 2022-11-14 DIAGNOSIS — D631 Anemia in chronic kidney disease: Secondary | ICD-10-CM | POA: Diagnosis not present

## 2022-11-14 DIAGNOSIS — N186 End stage renal disease: Secondary | ICD-10-CM | POA: Diagnosis not present

## 2022-11-14 DIAGNOSIS — D509 Iron deficiency anemia, unspecified: Secondary | ICD-10-CM | POA: Diagnosis not present

## 2022-11-14 DIAGNOSIS — Z992 Dependence on renal dialysis: Secondary | ICD-10-CM | POA: Diagnosis not present

## 2022-11-14 DIAGNOSIS — E1129 Type 2 diabetes mellitus with other diabetic kidney complication: Secondary | ICD-10-CM | POA: Diagnosis not present

## 2022-11-14 DIAGNOSIS — N2581 Secondary hyperparathyroidism of renal origin: Secondary | ICD-10-CM | POA: Diagnosis not present

## 2022-11-16 DIAGNOSIS — N186 End stage renal disease: Secondary | ICD-10-CM | POA: Diagnosis not present

## 2022-11-16 DIAGNOSIS — N2581 Secondary hyperparathyroidism of renal origin: Secondary | ICD-10-CM | POA: Diagnosis not present

## 2022-11-16 DIAGNOSIS — E1129 Type 2 diabetes mellitus with other diabetic kidney complication: Secondary | ICD-10-CM | POA: Diagnosis not present

## 2022-11-16 DIAGNOSIS — Z992 Dependence on renal dialysis: Secondary | ICD-10-CM | POA: Diagnosis not present

## 2022-11-16 DIAGNOSIS — D631 Anemia in chronic kidney disease: Secondary | ICD-10-CM | POA: Diagnosis not present

## 2022-11-16 DIAGNOSIS — D509 Iron deficiency anemia, unspecified: Secondary | ICD-10-CM | POA: Diagnosis not present

## 2022-11-19 DIAGNOSIS — N186 End stage renal disease: Secondary | ICD-10-CM | POA: Diagnosis not present

## 2022-11-19 DIAGNOSIS — Z992 Dependence on renal dialysis: Secondary | ICD-10-CM | POA: Diagnosis not present

## 2022-11-19 DIAGNOSIS — N2581 Secondary hyperparathyroidism of renal origin: Secondary | ICD-10-CM | POA: Diagnosis not present

## 2022-11-19 DIAGNOSIS — D509 Iron deficiency anemia, unspecified: Secondary | ICD-10-CM | POA: Diagnosis not present

## 2022-11-19 DIAGNOSIS — D631 Anemia in chronic kidney disease: Secondary | ICD-10-CM | POA: Diagnosis not present

## 2022-11-19 DIAGNOSIS — E1129 Type 2 diabetes mellitus with other diabetic kidney complication: Secondary | ICD-10-CM | POA: Diagnosis not present

## 2022-11-20 ENCOUNTER — Encounter: Payer: Self-pay | Admitting: Physical Therapy

## 2022-11-20 ENCOUNTER — Ambulatory Visit: Payer: Medicare Other | Admitting: Physical Therapy

## 2022-11-20 DIAGNOSIS — Z9181 History of falling: Secondary | ICD-10-CM

## 2022-11-20 DIAGNOSIS — M6281 Muscle weakness (generalized): Secondary | ICD-10-CM | POA: Diagnosis not present

## 2022-11-20 DIAGNOSIS — R293 Abnormal posture: Secondary | ICD-10-CM | POA: Diagnosis not present

## 2022-11-20 DIAGNOSIS — R5381 Other malaise: Secondary | ICD-10-CM | POA: Diagnosis not present

## 2022-11-20 DIAGNOSIS — R2681 Unsteadiness on feet: Secondary | ICD-10-CM

## 2022-11-20 DIAGNOSIS — R2689 Other abnormalities of gait and mobility: Secondary | ICD-10-CM | POA: Diagnosis not present

## 2022-11-20 NOTE — Therapy (Signed)
OUTPATIENT PHYSICAL THERAPY NEURO TREATMENT   Patient Name: [name] MRN: [id number] DOB:[birth year], 62 y.o., male Today's Date: 11/20/2022   PCP: Carol Ada REFERRING PROVIDER: Tylene Fantasia  END OF SESSION:  PT End of Session - 11/20/22 1421     Visit Number 2    Number of Visits 17    Date for PT Re-Evaluation 02/05/23    Authorization Type Medicare    PT Start Time 60    PT Stop Time 1500    PT Time Calculation (min) 48 min    Activity Tolerance Patient tolerated treatment well;Patient limited by fatigue    Behavior During Therapy WFL for tasks assessed/performed             Past Medical History:  Diagnosis Date   Arthritis    Atrial fibrillation (HCC)    CHF (congestive heart failure), NYHA class III (HCC)    s/p heart transplant   Chronic kidney disease    end stage   Chronic systolic dysfunction of left ventricle    CVA (cerebral infarction)    Diabetes mellitus    PMH; Prior to heart transplant   Family history of coronary artery disease    in both parents   GERD (gastroesophageal reflux disease)    Hyperlipidemia    Hypertension    Left bundle branch block    Morbid obesity ([place])    status post lap band   Myocardial infarction [facility])    prior to heart transplant   Nonischemic cardiomyopathy ([place])    prior to heart transplant   Obesity (BMI 30-39.9)    Obstructive sleep apnea    no longer needs CPAP after heart transplant per pt   Premature ventricular contractions    Prostate cancer (HCC)    SOB (shortness of breath)    Past Surgical History:  Procedure Laterality Date   CARDIAC PACEMAKER PLACEMENT  09/21/2009   Biventricular implantable cardioverter-defibrillator implantation      COLONOSCOPY W/ BIOPSIES AND POLYPECTOMY     CYSTOSCOPY WITH FULGERATION N/A 04/27/2021   Procedure: CYSTOSCOPY AND CLOT EVACUATION WITH BLADDER BIOPSY/ FUGARATION OF BLADDER/ FULGARATION OF PROSTATE ;  Surgeon: [name], MD;  [facility];   Service: Urology;  Laterality: N/A;   FOOT SURGERY     left   HEART TRANSPLANT  2014   LAPAROSCOPIC GASTRIC BANDING  01/27/2007   LEFT VENTRICULAR ASSIST DEVICE     implanted at [place] Right 07/22/2017   Procedure: RIGHT TOTAL HIP ARTHROPLASTY ANTERIOR APPROACH;  Surgeon: Rod Can, MD;  Location: [place];  Service: Orthopedics;  Laterality: Right;  Needs RNFA   TOTAL KNEE ARTHROPLASTY Right 07/22/2017   Patient Active Problem List   Diagnosis Date Noted   Peripheral autonomic neuropathy due to DM ([place]) 11/01/2022   Orthostatic hypotension 11/01/2022   Acute encephalopathy 07/04/2022   Chronic kidney disease, stage 5 ([place]) 11/14/2021   Gait abnormality 10/12/2021   Polyneuropathy associated with underlying disease ([place]) 10/12/2021   Neuropathic pain 10/12/2021   Morbid obesity ([place]) 11/19/2019   Sleep disorder 10/22/2019   Sleep disturbance 10/22/2019   Abnormality of gait 06/09/2019   Diabetic polyneuropathy associated with diabetes mellitus due to underlying condition ([place]) 04/14/2019   Peripheral neuropathy 12/11/2018   CVA (cerebral vascular accident) ([place]) 11/09/2018   H/O heart transplant ([place]) 11/09/2018   Malignant neoplasm of prostate ([place]) 05/06/2018   Closed posterior  dislocation of hip, right, initial encounter ([place]) 08/24/2017   End stage renal disease on dialysis ([place]) 08/24/2017   Hip dislocation, right ([place]) 08/24/2017   Osteoarthritis of right hip 07/22/2017   VENTRICULAR TACHYCARDIA 12/21/2009   Dyslipidemia 08/30/2009   Essential hypertension 08/30/2009   ISCHEMIC CARDIOMYOPATHY 08/30/2009   Atrial fibrillation ([place]) 08/30/2009   VENTRICULAR ECTOPY 08/30/2009   History of CVA (cerebrovascular accident) 08/30/2009   Obesity, Class III, BMI 40-49.9 (morbid obesity) ([place]) 08/30/2009   DM type 2 with diabetic peripheral neuropathy ([place]) 10/17/2007   OBSTRUCTIVE SLEEP APNEA 10/17/2007    CARDIOMYOPATHY, DILATED 10/17/2007    ONSET DATE: 10/31/22  REFERRING DIAG:  D84.9 (ICD-10-CM) - Immunodeficiency, unspecified Z94.1 (ICD-10-CM) - Heart transplant status Z48.298 (ICD-10-CM) - Encounter for aftercare following other organ transplant Z79.899 (ICD-10-CM) - Other long term (current) drug therapy G20.C (ICD-10-CM) - Parkinsonism, unspecified  THERAPY DIAG:  Physical deconditioning  Abnormal posture  Muscle weakness (generalized)  Other abnormalities of gait and mobility  Unsteadiness on feet  History of falling  Rationale for Evaluation and Treatment: Rehabilitation  SUBJECTIVE:                                                                                                                                                                                             SUBJECTIVE STATEMENT: No falls and no pain, doing okay  Pt accompanied by: significant other  PERTINENT HISTORY: heart transplant 7 years ago  On dialysis MWF, heart transplant 6 yrs ago, ESRD, prostate CA, OSA, obesity, CHF, GERD, had DM, CVA with residual left hand weakness, peripheral neuropathy, R THA, AFib   PAIN:  Are you having pain? No  PRECAUTIONS: None  WEIGHT BEARING RESTRICTIONS: No  FALLS: Has patient fallen in last 6 months? Yes. Number of falls more than 10  LIVING ENVIRONMENT: Lives with: lives with their family Lives in: House/apartment Stairs: No Has following equipment at home: Environmental consultant - 2 wheeled, Wheelchair (manual), shower chair, and Grab bars  PLOF: Independent with household mobility with device and Independent with community mobility with device  PATIENT GOALS: want to be able to get him to stand and be more mobile and build his strength  OBJECTIVE:   COGNITION: Overall cognitive status: Impaired and History of cognitive impairments - at baseline   SENSATION: WFL   POSTURE: rounded shoulders, forward head, increased thoracic kyphosis, and flexed trunk    LOWER EXTREMITY ROM:   limited with all motions due to weakness   LOWER EXTREMITY MMT:    MMT Right Eval Left Eval  Hip flexion 2- 2-  Hip extension    Hip abduction 2+  2+  Hip adduction 2+ 2+  Hip internal rotation    Hip external rotation    Knee flexion    Knee extension 2- 2+  Ankle dorsiflexion 3+ 3+  Ankle plantarflexion    Ankle inversion    Ankle eversion    (Blank rows = not tested)  TRANSFERS: Assistive device utilized: Environmental consultant - 2 wheeled  Sit to stand: Max A  GAIT: Level of assistance: Max A as wife reports Comments: unable to assess. Wife reports he takes a few steps in the room with walker  FUNCTIONAL TESTS:  5 times sit to stand: unable to do    TODAY'S TREATMENT:                                                                                                                              DATE:  11/20/22 Nustep level 3 x 5 minutes UE and LE then 1.5 minutes with LE only 1 set 10 in W/C straight arm pulls and rows 10# and 5# Seated volleyball Standing with walker marches Gait with w/c 100 feet with w/c follow LAQ no weight  Transfers to and from the car Mod/Max A  11/13/22-EVAL    PATIENT EDUCATION: Education details: POC and HEP  Person educated: Patient and Spouse Education method: Explanation Education comprehension: verbalized understanding  HOME EXERCISE PROGRAM: Access Code: [email or web address] URL: [email or web address] Date: 11/13/2022 Prepared by: Andris Baumann  Exercises - Seated March  - 1 x daily - 7 x weekly - 2 sets - 10 reps - Seated Knee Extension with Anchored Resistance  - 1 x daily - 7 x weekly - 2 sets - 10 reps - Seated Hip Abduction with Resistance  - 1 x daily - 7 x weekly - 2 sets - 10 reps - Seated Hip Adduction Isometrics with Ball  - 1 x daily - 7 x weekly - 2 sets - 10 reps - Seated Shoulder Horizontal Abduction with Resistance  - 1 x daily - 7 x weekly - 2 sets - 10 reps  GOALS: Goals reviewed with patient?  No  SHORT TERM GOALS: Target date: 12/25/22  Patient will be independent with initial HEP. Goal status: INITIAL  2.  Patient will demonstrate improved sitting posture in wheelchair Baseline: increased kyphosis and flexed trunk Goal status: INITIAL  3.  Patient will be modA with transfers Baseline: maxA Goal status: INITIAL   LONG TERM GOALS: Target date: 02/05/23  Patient will be independent with advanced/ongoing HEP to improve outcomes and carryover.  Goal status: INITIAL  2.  Patient will demonstrate improved functional LE strength as demonstrated by 4 or better. Baseline: 2/5  Goal status: INITIAL  3.  Patient will be independent with sit to stand from wheelchair Baseline: maxA Goal status: INITIAL  4.  Patient will be able to take at least 5 steps with rolling walker minA Baseline: per wife can take a few steps with modA Goal status: INITIAL   ASSESSMENT:  CLINICAL IMPRESSION: Initiated exercises and walking, he does need time to process and to perform tasks.  Mod/MaxA for transfers at first but once I gave him time and room he could do with Min A.  Walking with W/C follow with min A for the walker placement, c/o fatigue, no increase of pain  OBJECTIVE IMPAIRMENTS: decreased activity tolerance, decreased coordination, decreased endurance, decreased knowledge of condition, decreased mobility, difficulty walking, decreased strength, improper body mechanics, postural dysfunction, and obesity.   ACTIVITY LIMITATIONS: sitting, standing, squatting, stairs, transfers, bed mobility, bathing, toileting, dressing, hygiene/grooming, and locomotion level  PERSONAL FACTORS: Age, Behavior pattern, Fitness, Past/current experiences, Time since onset of injury/illness/exacerbation, and 3+ comorbidities: hx of stroke, heart and kidney transplant, hx of falls, dementia, DM, obesity, ESRD, Afib  are also affecting patient's functional outcome.   REHAB POTENTIAL: Fair    CLINICAL  DECISION MAKING: Evolving/moderate complexity  EVALUATION COMPLEXITY: Moderate   PLAN:  PT FREQUENCY: 2x/week  PT DURATION: 12 weeks  PLANNED INTERVENTIONS: Therapeutic exercises, Therapeutic activity, Neuromuscular re-education, Balance training, Gait training, Patient/Family education, Self Care, Joint mobilization, Stair training, Wheelchair mobility training, Cryotherapy, Moist heat, and Manual therapy  PLAN FOR NEXT SESSION: strengthening in wheelchair for LE and UE, can work on transfers    [employer], PT 11/20/2022, 2:23 PM

## 2022-11-21 DIAGNOSIS — E1129 Type 2 diabetes mellitus with other diabetic kidney complication: Secondary | ICD-10-CM | POA: Diagnosis not present

## 2022-11-21 DIAGNOSIS — Z992 Dependence on renal dialysis: Secondary | ICD-10-CM | POA: Diagnosis not present

## 2022-11-21 DIAGNOSIS — D509 Iron deficiency anemia, unspecified: Secondary | ICD-10-CM | POA: Diagnosis not present

## 2022-11-21 DIAGNOSIS — D631 Anemia in chronic kidney disease: Secondary | ICD-10-CM | POA: Diagnosis not present

## 2022-11-21 DIAGNOSIS — N186 End stage renal disease: Secondary | ICD-10-CM | POA: Diagnosis not present

## 2022-11-21 DIAGNOSIS — N2581 Secondary hyperparathyroidism of renal origin: Secondary | ICD-10-CM | POA: Diagnosis not present

## 2022-11-23 DIAGNOSIS — Z992 Dependence on renal dialysis: Secondary | ICD-10-CM | POA: Diagnosis not present

## 2022-11-23 DIAGNOSIS — D631 Anemia in chronic kidney disease: Secondary | ICD-10-CM | POA: Diagnosis not present

## 2022-11-23 DIAGNOSIS — N186 End stage renal disease: Secondary | ICD-10-CM | POA: Diagnosis not present

## 2022-11-23 DIAGNOSIS — E1129 Type 2 diabetes mellitus with other diabetic kidney complication: Secondary | ICD-10-CM | POA: Diagnosis not present

## 2022-11-23 DIAGNOSIS — N2581 Secondary hyperparathyroidism of renal origin: Secondary | ICD-10-CM | POA: Diagnosis not present

## 2022-11-23 DIAGNOSIS — D509 Iron deficiency anemia, unspecified: Secondary | ICD-10-CM | POA: Diagnosis not present

## 2022-11-26 DIAGNOSIS — N2581 Secondary hyperparathyroidism of renal origin: Secondary | ICD-10-CM | POA: Diagnosis not present

## 2022-11-26 DIAGNOSIS — N186 End stage renal disease: Secondary | ICD-10-CM | POA: Diagnosis not present

## 2022-11-26 DIAGNOSIS — T8620 Unspecified complication of heart transplant: Secondary | ICD-10-CM | POA: Diagnosis not present

## 2022-11-26 DIAGNOSIS — D509 Iron deficiency anemia, unspecified: Secondary | ICD-10-CM | POA: Diagnosis not present

## 2022-11-26 DIAGNOSIS — E1129 Type 2 diabetes mellitus with other diabetic kidney complication: Secondary | ICD-10-CM | POA: Diagnosis not present

## 2022-11-26 DIAGNOSIS — D631 Anemia in chronic kidney disease: Secondary | ICD-10-CM | POA: Diagnosis not present

## 2022-11-26 DIAGNOSIS — Z992 Dependence on renal dialysis: Secondary | ICD-10-CM | POA: Diagnosis not present

## 2022-11-27 ENCOUNTER — Ambulatory Visit: Payer: Medicare Other | Attending: Cardiology | Admitting: Physical Therapy

## 2022-11-27 DIAGNOSIS — M6281 Muscle weakness (generalized): Secondary | ICD-10-CM | POA: Insufficient documentation

## 2022-11-27 DIAGNOSIS — R293 Abnormal posture: Secondary | ICD-10-CM | POA: Insufficient documentation

## 2022-11-27 DIAGNOSIS — R5381 Other malaise: Secondary | ICD-10-CM | POA: Insufficient documentation

## 2022-11-27 DIAGNOSIS — Z9181 History of falling: Secondary | ICD-10-CM | POA: Insufficient documentation

## 2022-11-27 DIAGNOSIS — R2681 Unsteadiness on feet: Secondary | ICD-10-CM | POA: Insufficient documentation

## 2022-11-28 DIAGNOSIS — E13621 Other specified diabetes mellitus with foot ulcer: Secondary | ICD-10-CM | POA: Diagnosis not present

## 2022-11-28 DIAGNOSIS — M14671 Charcot's joint, right ankle and foot: Secondary | ICD-10-CM | POA: Diagnosis not present

## 2022-11-29 ENCOUNTER — Inpatient Hospital Stay (HOSPITAL_COMMUNITY): Payer: Medicare Other

## 2022-11-29 ENCOUNTER — Emergency Department (HOSPITAL_COMMUNITY): Payer: Medicare Other

## 2022-11-29 ENCOUNTER — Inpatient Hospital Stay (HOSPITAL_COMMUNITY)
Admission: EM | Admit: 2022-11-29 | Discharge: 2022-12-05 | DRG: 871 | Disposition: A | Discharge status: Home / Self Care | Payer: Medicare Other | Attending: Internal Medicine | Admitting: Internal Medicine

## 2022-11-29 ENCOUNTER — Other Ambulatory Visit: Payer: Self-pay

## 2022-11-29 ENCOUNTER — Ambulatory Visit: Payer: Medicare Other | Admitting: Physical Therapy

## 2022-11-29 DIAGNOSIS — E669 Obesity, unspecified: Secondary | ICD-10-CM | POA: Diagnosis not present

## 2022-11-29 DIAGNOSIS — F0392 Unspecified dementia, unspecified severity, with psychotic disturbance: Secondary | ICD-10-CM | POA: Diagnosis present

## 2022-11-29 DIAGNOSIS — Z96651 Presence of right artificial knee joint: Secondary | ICD-10-CM | POA: Diagnosis present

## 2022-11-29 DIAGNOSIS — E1122 Type 2 diabetes mellitus with diabetic chronic kidney disease: Secondary | ICD-10-CM | POA: Diagnosis present

## 2022-11-29 DIAGNOSIS — A419 Sepsis, unspecified organism: Secondary | ICD-10-CM | POA: Diagnosis not present

## 2022-11-29 DIAGNOSIS — G9341 Metabolic encephalopathy: Secondary | ICD-10-CM | POA: Diagnosis present

## 2022-11-29 DIAGNOSIS — R4182 Altered mental status, unspecified: Secondary | ICD-10-CM | POA: Diagnosis not present

## 2022-11-29 DIAGNOSIS — I25811 Atherosclerosis of native coronary artery of transplanted heart without angina pectoris: Secondary | ICD-10-CM | POA: Diagnosis present

## 2022-11-29 DIAGNOSIS — N186 End stage renal disease: Secondary | ICD-10-CM | POA: Diagnosis present

## 2022-11-29 DIAGNOSIS — I9589 Other hypotension: Secondary | ICD-10-CM | POA: Diagnosis not present

## 2022-11-29 DIAGNOSIS — I480 Paroxysmal atrial fibrillation: Secondary | ICD-10-CM | POA: Diagnosis present

## 2022-11-29 DIAGNOSIS — Z79891 Long term (current) use of opiate analgesic: Secondary | ICD-10-CM

## 2022-11-29 DIAGNOSIS — Z9884 Bariatric surgery status: Secondary | ICD-10-CM

## 2022-11-29 DIAGNOSIS — Z833 Family history of diabetes mellitus: Secondary | ICD-10-CM

## 2022-11-29 DIAGNOSIS — E871 Hypo-osmolality and hyponatremia: Secondary | ICD-10-CM | POA: Diagnosis present

## 2022-11-29 DIAGNOSIS — J189 Pneumonia, unspecified organism: Secondary | ICD-10-CM

## 2022-11-29 DIAGNOSIS — F32A Depression, unspecified: Secondary | ICD-10-CM | POA: Diagnosis present

## 2022-11-29 DIAGNOSIS — Z515 Encounter for palliative care: Secondary | ICD-10-CM | POA: Diagnosis not present

## 2022-11-29 DIAGNOSIS — E11610 Type 2 diabetes mellitus with diabetic neuropathic arthropathy: Secondary | ICD-10-CM | POA: Diagnosis present

## 2022-11-29 DIAGNOSIS — Z8546 Personal history of malignant neoplasm of prostate: Secondary | ICD-10-CM

## 2022-11-29 DIAGNOSIS — Z7901 Long term (current) use of anticoagulants: Secondary | ICD-10-CM

## 2022-11-29 DIAGNOSIS — R6520 Severe sepsis without septic shock: Secondary | ICD-10-CM | POA: Diagnosis not present

## 2022-11-29 DIAGNOSIS — I120 Hypertensive chronic kidney disease with stage 5 chronic kidney disease or end stage renal disease: Secondary | ICD-10-CM | POA: Diagnosis present

## 2022-11-29 DIAGNOSIS — F419 Anxiety disorder, unspecified: Secondary | ICD-10-CM | POA: Diagnosis present

## 2022-11-29 DIAGNOSIS — Z950 Presence of cardiac pacemaker: Secondary | ICD-10-CM

## 2022-11-29 DIAGNOSIS — E785 Hyperlipidemia, unspecified: Secondary | ICD-10-CM | POA: Diagnosis not present

## 2022-11-29 DIAGNOSIS — Z794 Long term (current) use of insulin: Secondary | ICD-10-CM

## 2022-11-29 DIAGNOSIS — J9601 Acute respiratory failure with hypoxia: Principal | ICD-10-CM | POA: Diagnosis present

## 2022-11-29 DIAGNOSIS — I252 Old myocardial infarction: Secondary | ICD-10-CM

## 2022-11-29 DIAGNOSIS — G9340 Encephalopathy, unspecified: Secondary | ICD-10-CM | POA: Diagnosis not present

## 2022-11-29 DIAGNOSIS — Z7189 Other specified counseling: Secondary | ICD-10-CM | POA: Diagnosis not present

## 2022-11-29 DIAGNOSIS — Z96641 Presence of right artificial hip joint: Secondary | ICD-10-CM | POA: Diagnosis present

## 2022-11-29 DIAGNOSIS — Z79899 Other long term (current) drug therapy: Secondary | ICD-10-CM

## 2022-11-29 DIAGNOSIS — Z6830 Body mass index (BMI) 30.0-30.9, adult: Secondary | ICD-10-CM

## 2022-11-29 DIAGNOSIS — R918 Other nonspecific abnormal finding of lung field: Secondary | ICD-10-CM | POA: Diagnosis not present

## 2022-11-29 DIAGNOSIS — K219 Gastro-esophageal reflux disease without esophagitis: Secondary | ICD-10-CM | POA: Diagnosis present

## 2022-11-29 DIAGNOSIS — G4733 Obstructive sleep apnea (adult) (pediatric): Secondary | ICD-10-CM | POA: Diagnosis present

## 2022-11-29 DIAGNOSIS — N250 Renal osteodystrophy: Secondary | ICD-10-CM | POA: Diagnosis not present

## 2022-11-29 DIAGNOSIS — Z992 Dependence on renal dialysis: Secondary | ICD-10-CM

## 2022-11-29 DIAGNOSIS — J690 Pneumonitis due to inhalation of food and vomit: Secondary | ICD-10-CM | POA: Insufficient documentation

## 2022-11-29 DIAGNOSIS — I428 Other cardiomyopathies: Secondary | ICD-10-CM | POA: Diagnosis not present

## 2022-11-29 DIAGNOSIS — N2581 Secondary hyperparathyroidism of renal origin: Secondary | ICD-10-CM | POA: Diagnosis present

## 2022-11-29 DIAGNOSIS — R0902 Hypoxemia: Secondary | ICD-10-CM | POA: Diagnosis not present

## 2022-11-29 DIAGNOSIS — I700 Atherosclerosis of aorta: Secondary | ICD-10-CM | POA: Diagnosis not present

## 2022-11-29 DIAGNOSIS — R1111 Vomiting without nausea: Secondary | ICD-10-CM | POA: Diagnosis not present

## 2022-11-29 DIAGNOSIS — M898X9 Other specified disorders of bone, unspecified site: Secondary | ICD-10-CM | POA: Diagnosis present

## 2022-11-29 DIAGNOSIS — I4891 Unspecified atrial fibrillation: Secondary | ICD-10-CM | POA: Diagnosis present

## 2022-11-29 DIAGNOSIS — Z8249 Family history of ischemic heart disease and other diseases of the circulatory system: Secondary | ICD-10-CM

## 2022-11-29 DIAGNOSIS — Z1152 Encounter for screening for COVID-19: Secondary | ICD-10-CM | POA: Diagnosis not present

## 2022-11-29 DIAGNOSIS — Z832 Family history of diseases of the blood and blood-forming organs and certain disorders involving the immune mechanism: Secondary | ICD-10-CM

## 2022-11-29 DIAGNOSIS — R5383 Other fatigue: Secondary | ICD-10-CM | POA: Diagnosis not present

## 2022-11-29 DIAGNOSIS — Z711 Person with feared health complaint in whom no diagnosis is made: Secondary | ICD-10-CM | POA: Diagnosis not present

## 2022-11-29 DIAGNOSIS — Z888 Allergy status to other drugs, medicaments and biological substances status: Secondary | ICD-10-CM

## 2022-11-29 DIAGNOSIS — Z91041 Radiographic dye allergy status: Secondary | ICD-10-CM

## 2022-11-29 DIAGNOSIS — R627 Adult failure to thrive: Secondary | ICD-10-CM | POA: Diagnosis present

## 2022-11-29 DIAGNOSIS — R404 Transient alteration of awareness: Secondary | ICD-10-CM | POA: Diagnosis not present

## 2022-11-29 DIAGNOSIS — Z993 Dependence on wheelchair: Secondary | ICD-10-CM

## 2022-11-29 DIAGNOSIS — Z789 Other specified health status: Secondary | ICD-10-CM | POA: Diagnosis not present

## 2022-11-29 DIAGNOSIS — Z880 Allergy status to penicillin: Secondary | ICD-10-CM

## 2022-11-29 DIAGNOSIS — D631 Anemia in chronic kidney disease: Secondary | ICD-10-CM | POA: Diagnosis present

## 2022-11-29 DIAGNOSIS — Z8673 Personal history of transient ischemic attack (TIA), and cerebral infarction without residual deficits: Secondary | ICD-10-CM

## 2022-11-29 DIAGNOSIS — Z87891 Personal history of nicotine dependence: Secondary | ICD-10-CM

## 2022-11-29 DIAGNOSIS — R531 Weakness: Secondary | ICD-10-CM | POA: Diagnosis not present

## 2022-11-29 DIAGNOSIS — Z823 Family history of stroke: Secondary | ICD-10-CM

## 2022-11-29 LAB — I-STAT ARTERIAL BLOOD GAS, ED
Acid-Base Excess: 4 mmol/L — ABNORMAL HIGH (ref 0.0–2.0)
Bicarbonate: 29 mmol/L — ABNORMAL HIGH (ref 20.0–28.0)
Calcium, Ion: 1.36 mmol/L (ref 1.15–1.40)
HCT: 30 % — ABNORMAL LOW (ref 39.0–52.0)
Hemoglobin: 10.2 g/dL — ABNORMAL LOW (ref 13.0–17.0)
O2 Saturation: 42 %
Patient temperature: 98.4
Potassium: 4.7 mmol/L (ref 3.5–5.1)
Sodium: 133 mmol/L — ABNORMAL LOW (ref 135–145)
TCO2: 30 mmol/L (ref 22–32)
pCO2 arterial: 46.6 mmHg (ref 32–48)
pH, Arterial: 7.402 (ref 7.35–7.45)
pO2, Arterial: 24 mmHg — CL (ref 83–108)

## 2022-11-29 LAB — CBC WITH DIFFERENTIAL/PLATELET
Abs Immature Granulocytes: 0.03 10*3/uL (ref 0.00–0.07)
Basophils Absolute: 0 10*3/uL (ref 0.0–0.1)
Basophils Relative: 0 %
Eosinophils Absolute: 0 10*3/uL (ref 0.0–0.5)
Eosinophils Relative: 0 %
HCT: 28.3 % — ABNORMAL LOW (ref 39.0–52.0)
Hemoglobin: 9.5 g/dL — ABNORMAL LOW (ref 13.0–17.0)
Immature Granulocytes: 0 %
Lymphocytes Relative: 6 %
Lymphs Abs: 0.6 10*3/uL — ABNORMAL LOW (ref 0.7–4.0)
MCH: 31.7 pg (ref 26.0–34.0)
MCHC: 33.6 g/dL (ref 30.0–36.0)
MCV: 94.3 fL (ref 80.0–100.0)
Monocytes Absolute: 1.1 10*3/uL — ABNORMAL HIGH (ref 0.1–1.0)
Monocytes Relative: 12 %
Neutro Abs: 8.1 10*3/uL — ABNORMAL HIGH (ref 1.7–7.7)
Neutrophils Relative %: 82 %
Platelets: 206 10*3/uL (ref 150–400)
RBC: 3 MIL/uL — ABNORMAL LOW (ref 4.22–5.81)
RDW: 16.8 % — ABNORMAL HIGH (ref 11.5–15.5)
WBC: 9.8 10*3/uL (ref 4.0–10.5)
nRBC: 0 % (ref 0.0–0.2)

## 2022-11-29 LAB — COMPREHENSIVE METABOLIC PANEL
ALT: 18 U/L (ref 0–44)
AST: 14 U/L — ABNORMAL LOW (ref 15–41)
Albumin: 2.4 g/dL — ABNORMAL LOW (ref 3.5–5.0)
Alkaline Phosphatase: 85 U/L (ref 38–126)
Anion gap: 15 (ref 5–15)
BUN: 42 mg/dL — ABNORMAL HIGH (ref 8–23)
CO2: 26 mmol/L (ref 22–32)
Calcium: 10.7 mg/dL — ABNORMAL HIGH (ref 8.9–10.3)
Chloride: 96 mmol/L — ABNORMAL LOW (ref 98–111)
Creatinine, Ser: 10.32 mg/dL — ABNORMAL HIGH (ref 0.61–1.24)
GFR, Estimated: 5 mL/min — ABNORMAL LOW (ref >60)
Glucose, Bld: 92 mg/dL (ref 70–99)
Potassium: 4.9 mmol/L (ref 3.5–5.1)
Sodium: 137 mmol/L (ref 135–145)
Total Bilirubin: 1.1 mg/dL (ref 0.3–1.2)
Total Protein: 6.2 g/dL — ABNORMAL LOW (ref 6.5–8.1)

## 2022-11-29 LAB — RESP PANEL BY RT-PCR (RSV, FLU A&B, COVID)  RVPGX2
Influenza A by PCR: NEGATIVE
Influenza B by PCR: NEGATIVE
Resp Syncytial Virus by PCR: NEGATIVE
SARS Coronavirus 2 by RT PCR: NEGATIVE

## 2022-11-29 LAB — HEPATITIS B SURFACE ANTIGEN: Hepatitis B Surface Ag: NONREACTIVE

## 2022-11-29 LAB — TROPONIN I (HIGH SENSITIVITY)
Troponin I (High Sensitivity): 58 ng/L — ABNORMAL HIGH (ref <18)
Troponin I (High Sensitivity): 58 ng/L — ABNORMAL HIGH (ref <18)

## 2022-11-29 LAB — CBG MONITORING, ED: Glucose-Capillary: 75 mg/dL (ref 70–99)

## 2022-11-29 LAB — PROTIME-INR
INR: 1.8 — ABNORMAL HIGH (ref 0.8–1.2)
Prothrombin Time: 20.3 seconds — ABNORMAL HIGH (ref 11.4–15.2)

## 2022-11-29 LAB — APTT: aPTT: 47 seconds — ABNORMAL HIGH (ref 24–36)

## 2022-11-29 LAB — BRAIN NATRIURETIC PEPTIDE: B Natriuretic Peptide: 1835.6 pg/mL — ABNORMAL HIGH (ref 0.0–100.0)

## 2022-11-29 LAB — MRSA NEXT GEN BY PCR, NASAL: MRSA by PCR Next Gen: NOT DETECTED

## 2022-11-29 LAB — LACTIC ACID, PLASMA
Lactic Acid, Venous: 1.5 mmol/L (ref 0.5–1.9)
Lactic Acid, Venous: 2.8 mmol/L (ref 0.5–1.9)

## 2022-11-29 MED ORDER — SODIUM CHLORIDE 0.9 % IV SOLN: 500.0000 mg | INTRAVENOUS | Status: DC

## 2022-11-29 MED ORDER — MIDODRINE HCL 5 MG PO TABS: 10.0000 mg | ORAL_TABLET | Freq: Three times a day (TID) | ORAL | Status: DC

## 2022-11-29 MED ORDER — OXYBUTYNIN CHLORIDE 5 MG PO TABS: 5.0000 mg | ORAL_TABLET | Freq: Three times a day (TID) | ORAL | Status: DC | PRN

## 2022-11-29 MED ORDER — ONDANSETRON HCL 4 MG/2ML IJ SOLN
4.0000 mg | Freq: Four times a day (QID) | INTRAMUSCULAR | Status: DC | PRN
  Administered 2022-12-04: 4 mg via INTRAVENOUS
  Filled 2022-11-29: qty 2

## 2022-11-29 MED ORDER — ROSUVASTATIN CALCIUM 5 MG PO TABS
10.0000 mg | ORAL_TABLET | Freq: Every day | ORAL | Status: DC
  Administered 2022-11-30 – 2022-12-04 (×5): 10 mg via ORAL
  Filled 2022-11-29 (×5): qty 2

## 2022-11-29 MED ORDER — ACETAMINOPHEN 325 MG PO TABS
650.0000 mg | ORAL_TABLET | Freq: Four times a day (QID) | ORAL | Status: DC
  Administered 2022-11-29 – 2022-12-05 (×19): 650 mg via ORAL
  Filled 2022-11-29 (×19): qty 2

## 2022-11-29 MED ORDER — SODIUM CHLORIDE 0.9 % IV BOLUS
500.0000 mL | Freq: Once | INTRAVENOUS | Status: AC
  Administered 2022-11-29: 500 mL via INTRAVENOUS

## 2022-11-29 MED ORDER — MIDODRINE HCL 5 MG PO TABS: 10.0000 mg | ORAL_TABLET | Freq: Once | ORAL | Status: DC

## 2022-11-29 MED ORDER — ONDANSETRON HCL 4 MG PO TABS: 4.0000 mg | ORAL_TABLET | Freq: Four times a day (QID) | ORAL | Status: DC | PRN

## 2022-11-29 MED ORDER — VANCOMYCIN VARIABLE DOSE PER UNSTABLE RENAL FUNCTION (PHARMACIST DOSING): Status: DC

## 2022-11-29 MED ORDER — SODIUM CHLORIDE 0.9 % IV SOLN
2.0000 g | Freq: Once | INTRAVENOUS | Status: AC
  Administered 2022-11-29: 2 g via INTRAVENOUS
  Filled 2022-11-29: qty 12.5

## 2022-11-29 MED ORDER — APIXABAN 2.5 MG PO TABS
2.5000 mg | ORAL_TABLET | Freq: Two times a day (BID) | ORAL | Status: DC
  Administered 2022-11-29 – 2022-12-04 (×10): 2.5 mg via ORAL
  Filled 2022-11-29 (×10): qty 1

## 2022-11-29 MED ORDER — VANCOMYCIN HCL 10 G IV SOLR
2500.0000 mg | Freq: Once | INTRAVENOUS | Status: AC
  Administered 2022-11-29: 2500 mg via INTRAVENOUS
  Filled 2022-11-29: qty 2500

## 2022-11-29 MED ORDER — CYCLOSPORINE MODIFIED (GENGRAF) 25 MG PO CAPS
175.0000 mg | ORAL_CAPSULE | Freq: Every day | ORAL | Status: DC
  Administered 2022-11-29 – 2022-11-30 (×2): 175 mg via ORAL
  Filled 2022-11-29 (×2): qty 7

## 2022-11-29 MED ORDER — MYCOPHENOLATE SODIUM 180 MG PO TBEC
720.0000 mg | DELAYED_RELEASE_TABLET | Freq: Two times a day (BID) | ORAL | Status: DC
  Administered 2022-11-29 – 2022-12-04 (×10): 720 mg via ORAL
  Filled 2022-11-29 (×13): qty 4

## 2022-11-29 MED ORDER — IPRATROPIUM-ALBUTEROL 0.5-2.5 (3) MG/3ML IN SOLN
3.0000 mL | Freq: Four times a day (QID) | RESPIRATORY_TRACT | Status: DC
  Administered 2022-11-29 – 2022-12-02 (×6): 3 mL via RESPIRATORY_TRACT
  Filled 2022-11-29 (×8): qty 3

## 2022-11-29 MED ORDER — MIDODRINE HCL 5 MG PO TABS
10.0000 mg | ORAL_TABLET | Freq: Three times a day (TID) | ORAL | Status: DC
  Administered 2022-11-29 – 2022-12-05 (×18): 10 mg via ORAL
  Filled 2022-11-29 (×18): qty 2

## 2022-11-29 MED ORDER — GUAIFENESIN ER 600 MG PO TB12
1200.0000 mg | ORAL_TABLET | Freq: Two times a day (BID) | ORAL | Status: DC
  Administered 2022-11-29 – 2022-12-04 (×10): 1200 mg via ORAL
  Filled 2022-11-29 (×10): qty 2

## 2022-11-29 MED ORDER — OXYCODONE HCL 5 MG PO TABS: 5.0000 mg | ORAL_TABLET | ORAL | Status: DC | PRN

## 2022-11-29 MED ORDER — SODIUM CHLORIDE 0.9 % IV SOLN
1.0000 g | INTRAVENOUS | Status: DC
  Administered 2022-11-30 – 2022-12-05 (×6): 1 g via INTRAVENOUS
  Filled 2022-11-29 (×6): qty 10

## 2022-11-29 MED ORDER — SODIUM CHLORIDE 0.9 % IV SOLN: 2.0000 g | INTRAVENOUS | Status: DC

## 2022-11-29 MED ORDER — SENNOSIDES-DOCUSATE SODIUM 8.6-50 MG PO TABS: 1.0000 | ORAL_TABLET | Freq: Every evening | ORAL | Status: DC | PRN

## 2022-11-29 MED ORDER — VANCOMYCIN HCL IN DEXTROSE 1-5 GM/200ML-% IV SOLN: 1000.0000 mg | Freq: Once | INTRAVENOUS | Status: DC

## 2022-11-29 MED ORDER — SEVELAMER CARBONATE 800 MG PO TABS
1600.0000 mg | ORAL_TABLET | Freq: Three times a day (TID) | ORAL | Status: DC
  Administered 2022-11-29 – 2022-12-05 (×13): 1600 mg via ORAL
  Filled 2022-11-29 (×13): qty 2

## 2022-11-29 MED ORDER — CHLORHEXIDINE GLUCONATE CLOTH 2 % EX PADS
6.0000 | MEDICATED_PAD | Freq: Every day | CUTANEOUS | Status: DC
  Administered 2022-11-30 – 2022-12-05 (×5): 6 via TOPICAL

## 2022-11-29 MED ORDER — DIVALPROEX SODIUM 500 MG PO DR TAB
500.0000 mg | DELAYED_RELEASE_TABLET | Freq: Every day | ORAL | Status: DC
  Administered 2022-11-29 – 2022-12-04 (×6): 500 mg via ORAL
  Filled 2022-11-29 (×6): qty 1

## 2022-11-29 MED ORDER — PREGABALIN 100 MG PO CAPS
300.0000 mg | ORAL_CAPSULE | Freq: Every day | ORAL | Status: DC
  Administered 2022-11-30: 300 mg via ORAL
  Filled 2022-11-29: qty 3

## 2022-11-29 MED ORDER — METHOCARBAMOL 500 MG PO TABS: 500.0000 mg | ORAL_TABLET | Freq: Three times a day (TID) | ORAL | Status: DC | PRN

## 2022-11-29 MED ORDER — DIVALPROEX SODIUM 250 MG PO DR TAB
250.0000 mg | DELAYED_RELEASE_TABLET | Freq: Every day | ORAL | Status: DC
  Administered 2022-11-29 – 2022-12-04 (×6): 250 mg via ORAL
  Filled 2022-11-29 (×7): qty 1

## 2022-11-29 MED ORDER — DULOXETINE HCL 60 MG PO CPEP
60.0000 mg | ORAL_CAPSULE | Freq: Every day | ORAL | Status: DC
  Administered 2022-11-30 – 2022-12-04 (×5): 60 mg via ORAL
  Filled 2022-11-29 (×5): qty 1

## 2022-11-29 MED ORDER — DIVALPROEX SODIUM 250 MG PO DR TAB: 375.0000 mg | DELAYED_RELEASE_TABLET | Freq: Two times a day (BID) | ORAL | Status: DC

## 2022-11-29 NOTE — Progress Notes (Signed)
Pharmacy Antibiotic Note  [name] is a 62 y.o. male admitted on 11/29/2022 with pneumonia.  Pharmacy has been consulted for vancomycin and cefepime dosing.  Patient presents with several days of lethargy/AMS and vomiting 3/31. He is ESRD on HD MWF outpatient--he skipped dialysis earlier this week and yesterday as well. CT chest showing underinflation with lung base parenchymal opacities--atelectasis versus infiltrate.  Plan: Give vancomycin 2500mg  IV x1 now  Give cefepime 2g IV x1 now  Follow-up HD schedule for further dosing  Vancomycin cultures as needed  Monitor renal function, cultures, and overall clinical picture    Height: 6\' 6"  (198.1 cm) Weight: (!) 140 kg (308 lb 10.3 oz) IBW/kg (Calculated) : 91.4  Temp (24hrs), Avg:98.4 F (36.9 C), Min:98.4 F (36.9 C), Max:98.4 F (36.9 C)  Recent Labs  Lab 11/29/22 1019  WBC 9.8    CrCl cannot be calculated (Patient's most recent lab result is older than the maximum 21 days allowed.).    Allergies  Allergen Reactions   Heparin Nausea Only, Swelling and Other (See Comments)    * * HIT * *  SWELLING REACTION UNSPECIFIED   DIAPHORESIS    Iodinated Contrast Media    Penicillin G Potassium [Penicillin G] Nausea Only and Other (See Comments)    DIAPHORESIS    Antimicrobials this admission: 4/4 vancomycin >>  4/4 cefepime >>   Dose adjustments this admission: N/A  Microbiology results: 4/4 BCx: in process  4/4 resp panel: negative  4/4 MRSA PCR: needs to be collected    Thank you for allowing pharmacy to be a part of this patient's care.  Billey Gosling, PharmD PGY1 Pharmacy Resident 4/4/202411:09 AM

## 2022-11-29 NOTE — ED Triage Notes (Signed)
Pt BIB [facility] from home due to lethargy and altered mental status.  Pt was found vomiting and slumped over Sunday evening 11/25/22.  Pt is a heart transplant patient 2014.  Dialysis M, W, F and did not get treatment yesterday 11/28/22.  [facility] heard in all fields per [facility].  BP 98/56, CBG 108, SpO2 68% RA.  20g left AC.

## 2022-11-29 NOTE — ED Notes (Signed)
Notified Zhang MD about pt BP

## 2022-11-29 NOTE — Progress Notes (Signed)
[facility] will follow per sepsis protocol  

## 2022-11-29 NOTE — ED Notes (Signed)
ED TO INPATIENT HANDOFF REPORT  ED Nurse Name and Phone #: Clifton James [phone]  S Name/Age/Gender [name] 62 y.o. male Room/Bed: 030C/030C  Code Status   Code Status: Full Code  Home/SNF/Other Home Patient oriented to: self and place Is this baseline? No   Triage Complete: Triage complete  Chief Complaint PNA (pneumonia) [J18.9]  Triage Note Pt BIB [facility] from home due to lethargy and altered mental status.  Pt was found vomiting and slumped over Sunday evening 11/25/22.  Pt is a heart transplant patient 2014.  Dialysis M, W, F and did not get treatment yesterday 11/28/22.  [place] heard in all fields per [facility].  BP 98/56, CBG 108, SpO2 68% RA.  20g left AC.   Allergies Allergies  Allergen Reactions   Heparin Nausea Only, Swelling and Other (See Comments)    * * HIT * *  SWELLING REACTION UNSPECIFIED   DIAPHORESIS    Iodinated Contrast Media    Penicillin G Potassium [Penicillin G] Nausea Only and Other (See Comments)    DIAPHORESIS    Level of Care/Admitting Diagnosis ED Disposition     ED Disposition  Admit   Condition  --   Comment  Hospital Area: [facility] [[id number]]  Level of Care: Progressive [102]  Admit to Progressive based on following criteria: NEUROLOGICAL AND NEUROSURGICAL complex patients with significant risk of instability, who do not meet ICU criteria, yet require close observation or frequent assessment (< / = every 2 - 4 hours) with medical / nursing intervention.  May admit patient to [facility] or [facility] if equivalent level of care is available:: No  Covid Evaluation: Asymptomatic - no recent exposure (last 10 days) testing not required  Diagnosis: PNA (pneumonia) [id number]  Admitting Physician: Lequita Halt [id number]  Attending Physician: Lequita Halt [id number]  Certification:: I certify this patient will need inpatient services for at least 2 midnights  Estimated Length of Stay: 3          B Medical/Surgery  History Past Medical History:  Diagnosis Date   Arthritis    Atrial fibrillation ([place])    CHF (congestive heart failure), NYHA class III ([place])    s/p heart transplant   Chronic kidney disease    end stage   Chronic systolic dysfunction of left ventricle    CVA (cerebral infarction)    Diabetes mellitus    PMH; Prior to heart transplant   Family history of coronary artery disease    in both parents   GERD (gastroesophageal reflux disease)    Hyperlipidemia    Hypertension    Left bundle branch block    Morbid obesity ([place])    status post lap band   Myocardial infarction [facility])    prior to heart transplant   Nonischemic cardiomyopathy ([place])    prior to heart transplant   Obesity (BMI 30-39.9)    Obstructive sleep apnea    no longer needs CPAP after heart transplant per pt   Premature ventricular contractions    Prostate cancer (H[facility])    SOB (shortness of breath)    Past Surgical History:  Procedure Laterality Date   CARDIAC PACEMAKER PLACEMENT  09/21/2009   Biventricular implantable cardioverter-defibrillator implantation      COLONOSCOPY W/ BIOPSIES AND POLYPECTOMY     CYSTOSCOPY WITH FULGERATION N/A 04/27/2021   Procedure: CYSTOSCOPY AND CLOT EVACUATION WITH BLADDER BIOPSY/ FUGARATION OF BLADDER/ FULGARATION OF PROSTATE ;  Surgeon: [name], MD;  Location: [facility];  Service: Urology;  Laterality: N/A;   FOOT SURGERY     left   HEART TRANSPLANT  2014   LAPAROSCOPIC GASTRIC BANDING  01/27/2007   LEFT VENTRICULAR ASSIST DEVICE     implanted at [facility] Right 07/22/2017   Procedure: RIGHT TOTAL HIP ARTHROPLASTY ANTERIOR APPROACH;  Surgeon: Rod Can, MD;  Location: [facility];  Service: Orthopedics;  Laterality: Right;  Needs RNFA   TOTAL KNEE ARTHROPLASTY Right 07/22/2017     A IV Location/Drains/Wounds Patient Lines/Drains/Airways Status     Active Line/Drains/Airways     Name Placement date  Placement time Site Days   Peripheral IV 11/29/22 20 G Left Antecubital 11/29/22  1003  Antecubital  less than 1   Fistula / Graft Left Forearm --  --  Forearm  --   Fistula / Graft Right Upper arm Arteriovenous fistula --  --  Upper arm  --   Incision (Closed) 07/22/17 Hip Right 07/22/17  1049  -- 1956   Incision (Closed) 04/27/21 Penis Other (Comment) 04/27/21  1634  -- 581            Intake/Output Last 24 hours No intake or output data in the 24 hours ending 11/29/22 1500  Labs/Imaging Results for orders placed or performed during the hospital encounter of 11/29/22 (from the past 48 hour(s))  Lactic acid, plasma     Status: None   Collection Time: 11/29/22 10:19 AM  Result Value Ref Range   Lactic Acid, Venous 1.5 0.5 - 1.9 mmol/L    Comment: Performed at [facility] [address], [place], [place] [address]  Comprehensive metabolic panel     Status: Abnormal   Collection Time: 11/29/22 10:19 AM  Result Value Ref Range   Sodium 137 135 - 145 mmol/L   Potassium 4.9 3.5 - 5.1 mmol/L   Chloride 96 (L) 98 - 111 mmol/L   CO2 26 22 - 32 mmol/L   Glucose, Bld 92 70 - 99 mg/dL    Comment: Glucose reference range applies only to samples taken after fasting for at least 8 hours.   BUN 42 (H) 8 - 23 mg/dL   Creatinine, Ser 10.32 (H) 0.61 - 1.24 mg/dL   Calcium 10.7 (H) 8.9 - 10.3 mg/dL   Total Protein 6.2 (L) 6.5 - 8.1 g/dL   Albumin 2.4 (L) 3.5 - 5.0 g/dL   AST 14 (L) 15 - 41 U/L   ALT 18 0 - 44 U/L   Alkaline Phosphatase 85 38 - 126 U/L   Total Bilirubin 1.1 0.3 - 1.2 mg/dL   GFR, Estimated 5 (L) >60 mL/min    Comment: (NOTE) Calculated using the CKD-EPI Creatinine Equation (2021)    Anion gap 15 5 - 15    Comment: Performed at [facility], [place] [address], [place], [place] [address]  CBC with Differential     Status: Abnormal   Collection Time: 11/29/22 10:19 AM  Result Value Ref Range   WBC 9.8 4.0 - 10.5 K/uL   RBC 3.00 (L) 4.22 - 5.81 MIL/uL    Hemoglobin 9.5 (L) 13.0 - 17.0 g/dL   HCT 28.3 (L) 39.0 - 52.0 %   MCV 94.3 80.0 - 100.0 fL   MCH 31.7 26.0 - 34.0 pg   MCHC 33.6 30.0 - 36.0 g/dL   RDW 16.8 (H) 11.5 - 15.5 %   Platelets 206 150 -  400 K/uL   nRBC 0.0 0.0 - 0.2 %   Neutrophils Relative % 82 %   Neutro Abs 8.1 (H) 1.7 - 7.7 K/uL   Lymphocytes Relative 6 %   Lymphs Abs 0.6 (L) 0.7 - 4.0 K/uL   Monocytes Relative 12 %   Monocytes Absolute 1.1 (H) 0.1 - 1.0 K/uL   Eosinophils Relative 0 %   Eosinophils Absolute 0.0 0.0 - 0.5 K/uL   Basophils Relative 0 %   Basophils Absolute 0.0 0.0 - 0.1 K/uL   Immature Granulocytes 0 %   Abs Immature Granulocytes 0.03 0.00 - 0.07 K/uL    Comment: Performed at [facility] [address], [place], [place] [address]  Protime-INR     Status: Abnormal   Collection Time: 11/29/22 10:19 AM  Result Value Ref Range   Prothrombin Time 20.3 (H) 11.4 - 15.2 seconds   INR 1.8 (H) 0.8 - 1.2    Comment: (NOTE) INR goal varies based on device and disease states. Performed at [facility], [place] [address] [place], [place] [address]   APTT     Status: Abnormal   Collection Time: 11/29/22 10:19 AM  Result Value Ref Range   aPTT 47 (H) 24 - 36 seconds    Comment:        IF BASELINE aPTT IS ELEVATED, SUGGEST PATIENT RISK ASSESSMENT BE USED TO DETERMINE APPROPRIATE ANTICOAGULANT THERAPY. Performed at [facility], [place] [address], [place], [place] [address]   Troponin I (High Sensitivity)     Status: Abnormal   Collection Time: 11/29/22 10:19 AM  Result Value Ref Range   Troponin I (High Sensitivity) 58 (H) <18 ng/L    Comment: (NOTE) Elevated high sensitivity troponin I (hsTnI) values and significant  changes across serial measurements may suggest ACS but many other  chronic and acute conditions are known to elevate hsTnI results.  Refer to the "Links" section for chest pain algorithms and additional  guidance. Performed at [facility], [place] [address],  [place], [place] [address]   Brain natriuretic peptide     Status: Abnormal   Collection Time: 11/29/22 10:19 AM  Result Value Ref Range   B Natriuretic Peptide 1,835.6 (H) 0.0 - 100.0 pg/mL    Comment: Performed at [facility] [address], [place], [place] [address]  Resp panel by RT-PCR (RSV, Flu A&B, Covid) Anterior Nasal Swab     Status: None   Collection Time: 11/29/22 10:27 AM   Specimen: Anterior Nasal Swab  Result Value Ref Range   SARS Coronavirus 2 by RT PCR NEGATIVE NEGATIVE   Influenza A by PCR NEGATIVE NEGATIVE   Influenza B by PCR NEGATIVE NEGATIVE    Comment: (NOTE) The Xpert Xpress SARS-CoV-2/FLU/RSV plus assay is intended as an aid in the diagnosis of influenza from Nasopharyngeal swab specimens and should not be used as a sole basis for treatment. Nasal washings and aspirates are unacceptable for Xpert Xpress SARS-CoV-2/FLU/RSV testing.  Fact Sheet for Patients: EntrepreneurPulse.com.au  Fact Sheet for Healthcare Providers: IncredibleEmployment.be  This test is not yet approved or cleared by the [place] FDA and has been authorized for detection and/or diagnosis of SARS-CoV-2 by FDA under an Emergency Use Authorization (EUA). This EUA will remain in effect (meaning this test can be used) for the duration of the COVID-19 declaration under Section 564(b)(1) of the Act, 21 U.S.C. section 360bbb-3(b)(1), unless the authorization is terminated or revoked.  Resp Syncytial Virus by PCR NEGATIVE NEGATIVE    Comment: (NOTE) Fact Sheet for Patients: EntrepreneurPulse.com.au  Fact Sheet for Healthcare Providers: IncredibleEmployment.be  This test is not yet approved or cleared by the [place] FDA and has been authorized for detection and/or diagnosis of SARS-CoV-2 by FDA under an Emergency Use Authorization (EUA). This EUA will remain in effect (meaning this test can be used)  for the duration of the COVID-19 declaration under Section 564(b)(1) of the Act, 21 U.S.C. section 360bbb-3(b)(1), unless the authorization is terminated or revoked.  Performed at [facility], [place] [address] [place], [place] [address]   I-Stat arterial blood gas, ED     Status: Abnormal   Collection Time: 11/29/22 10:37 AM  Result Value Ref Range   pH, Arterial 7.402 7.35 - 7.45   pCO2 arterial 46.6 32 - 48 mmHg   pO2, Arterial 24 (LL) 83 - 108 mmHg   Bicarbonate 29.0 (H) 20.0 - 28.0 mmol/L   TCO2 30 22 - 32 mmol/L   O2 Saturation 42 %   Acid-Base Excess 4.0 (H) 0.0 - 2.0 mmol/L   Sodium 133 (L) 135 - 145 mmol/L   Potassium 4.7 3.5 - 5.1 mmol/L   Calcium, Ion 1.36 1.15 - 1.40 mmol/L   HCT 30.0 (L) 39.0 - 52.0 %   Hemoglobin 10.2 (L) 13.0 - 17.0 g/dL   Patient temperature 98.4 F    Collection site RADIAL, ALLEN'S TEST ACCEPTABLE    Drawn by RT    Sample type ARTERIAL    Comment NOTIFIED PHYSICIAN   CBG monitoring, ED     Status: None   Collection Time: 11/29/22 10:44 AM  Result Value Ref Range   Glucose-Capillary 75 70 - 99 mg/dL    Comment: Glucose reference range applies only to samples taken after fasting for at least 8 hours.  Troponin I (High Sensitivity)     Status: Abnormal   Collection Time: 11/29/22 12:35 PM  Result Value Ref Range   Troponin I (High Sensitivity) 58 (H) <18 ng/L    Comment: (NOTE) Elevated high sensitivity troponin I (hsTnI) values and significant  changes across serial measurements may suggest ACS but many other  chronic and acute conditions are known to elevate hsTnI results.  Refer to the "Links" section for chest pain algorithms and additional  guidance. Performed at [facility], [place] [address] [place], [place] [address]    CT Head Wo Contrast  Result Date: 11/29/2022 CLINICAL DATA:  Mental status change. Lethargy. Dialysis patient. Heart transplant. EXAM: CT HEAD WITHOUT CONTRAST TECHNIQUE: Contiguous axial images were  obtained from the base of the skull through the vertex without intravenous contrast. RADIATION DOSE REDUCTION: This exam was performed according to the departmental dose-optimization program which includes automated exposure control, adjustment of the mA and/or kV according to patient size and/or use of iterative reconstruction technique. COMPARISON:  07/04/2022 FINDINGS: Brain: Moderate low density in the periventricular white matter likely related to small vessel disease. Right frontal hypoattenuation, including on 21/3 is similar to the prior. Mild age advanced cerebral atrophy. No mass lesion, hemorrhage, hydrocephalus, acute infarct, intra-axial, or extra-axial fluid collection. Vascular: No hyperdense vessel or unexpected calcification. Skull: No significant soft tissue swelling.  No skull fracture. Sinuses/Orbits: Normal imaged portions of the orbits and globes. Clear paranasal sinuses and mastoid air cells. Other: None. IMPRESSION: 1.  No acute intracranial abnormality. 2. Similar appearance of right frontal hypoattenuation which is secondary to remote infarct when compared to  11/09/2018 brain MR report. 3.  Cerebral atrophy and small vessel ischemic change. Electronically Signed   By: Abigail Miyamoto M.D.   On: 11/29/2022 13:50   DG Chest Port 1 View  Result Date: 11/29/2022 CLINICAL DATA:  Questionable sepsis EXAM: PORTABLE CHEST 1 VIEW COMPARISON:  X-ray 07/05/2022 FINDINGS: Postop chest with sternal wires. Enlarged cardiopericardial silhouette with vascular congestion. There is some lung base opacities identified which are similar to previous. Left retrocardiac greater than right. No pneumothorax or effusion. Film is rotated to the left. Overlapping cardiac leads. Right-sided rib fractures again seen. IMPRESSION: Underinflation with lung base parenchymal opacities. Atelectasis versus infiltrate. Postop chest with enlarged heart and central vascular congestion. Electronically Signed   By: Jill Side  M.D.   On: 11/29/2022 11:13    Pending Labs Unresulted Labs (From admission, onward)     Start     Ordered   11/30/22 0500  CBC  Tomorrow morning,   R        11/29/22 1432   11/30/22 [id number]  Basic metabolic panel  Tomorrow morning,   R        11/29/22 1432   11/29/22 1433  Strep pneumoniae urinary antigen  Once,   R        11/29/22 1432   11/29/22 1433  Expectorated Sputum Assessment w Gram Stain, Rflx to Resp Cult  Once,   R        11/29/22 1432   11/29/22 1041  MRSA Next Gen by PCR, Nasal  Once,   URGENT        11/29/22 1041   11/29/22 1019  Lactic acid, plasma  (Septic presentation on arrival (screening labs, nursing and treatment orders for obvious sepsis))  Now then every 2 hours,   R      11/29/22 1018   11/29/22 1019  Blood Culture (routine x 2)  (Septic presentation on arrival (screening labs, nursing and treatment orders for obvious sepsis))  BLOOD CULTURE X 2,   STAT      11/29/22 1018   11/29/22 1019  Urinalysis, w/ Reflex to Culture (Infection Suspected) -Urine, Clean Catch  (Septic presentation on arrival (screening labs, nursing and treatment orders for obvious sepsis))  Once,   URGENT       Question:  Specimen Source  Answer:  Urine, Clean Catch   11/29/22 1018            Vitals/Pain Today's Vitals   11/29/22 1400 11/29/22 1415 11/29/22 1430 11/29/22 1445  BP: (!) 95/54 (!) 90/57 (!) 98/56 103/64  Pulse: 89 (!) 265 95 97  Resp: 20 (!) 23 14 (!) 9  Temp:      TempSrc:      SpO2: 97% 94% 100% 99%  Weight:      Height:      PainSc:        Isolation Precautions No active isolations  Medications Medications  midodrine (PROAMATINE) tablet 10 mg (10 mg Oral Given 11/29/22 1151)  acetaminophen (TYLENOL) tablet 650 mg (has no administration in time range)  oxyCODONE (Oxy IR/ROXICODONE) immediate release tablet 5 mg (has no administration in time range)  rosuvastatin (CRESTOR) tablet 10 mg (has no administration in time range)  DULoxetine (CYMBALTA) DR capsule 60  mg (has no administration in time range)  sevelamer carbonate (RENVELA) tablet 1,600 mg (has no administration in time range)  oxybutynin (DITROPAN) tablet 5 mg (has no administration in time range)  apixaban (ELIQUIS) tablet 2.5 mg (has no administration in  time range)  cycloSPORINE modified (NEORAL) capsule 100 mg (has no administration in time range)  mycophenolate (MYFORTIC) EC tablet 720 mg (has no administration in time range)  divalproex (DEPAKOTE) DR tablet 375 mg (has no administration in time range)  methocarbamol (ROBAXIN) tablet 500 mg (has no administration in time range)  pregabalin (LYRICA) capsule 300 mg (has no administration in time range)  ondansetron (ZOFRAN) tablet 4 mg (has no administration in time range)    Or  ondansetron (ZOFRAN) injection 4 mg (has no administration in time range)  senna-docusate (Senokot-S) tablet 1 tablet (has no administration in time range)  ipratropium-albuterol (DUONEB) 0.5-2.5 (3) MG/3ML nebulizer solution 3 mL (has no administration in time range)  guaiFENesin (MUCINEX) 12 hr tablet 1,200 mg (has no administration in time range)  ceFEPIme (MAXIPIME) 2 g in sodium chloride 0.9 % 100 mL IVPB (0 g Intravenous Stopped 11/29/22 1128)  vancomycin (VANCOCIN) 2,500 mg in sodium chloride 0.9 % 500 mL IVPB (0 mg Intravenous Stopped 11/29/22 1350)  sodium chloride 0.9 % bolus 500 mL (0 mLs Intravenous Stopped 11/29/22 1447)  sodium chloride 0.9 % bolus 500 mL (0 mLs Intravenous Stopped 11/29/22 1447)    Mobility non-ambulatory     Focused Assessments Neuro Assessment Handoff:  Swallow screen pass? Yes          Neuro Assessment:   Neuro Checks:      Has TPA been given? No If patient is a Neuro Trauma and patient is going to OR before floor call report to [facility] nurse: [phone] or [phone]  , Renal Assessment Handoff:  Hemodialysis Schedule: Hemodialysis Schedule: Monday/Wednesday/Friday Last Hemodialysis date and time: Did not got  treatment yesterday.   Restricted appendage: right arm   R Recommendations: See Admitting Provider Note  Report given to:   Additional Notes: PT been Hypotensive with [place], 2 581ml given.

## 2022-11-29 NOTE — H&P (Signed)
History and Physical    [name] [id number] DOB: Sep 15, 1960 DOA: 11/29/2022  PCP: Carol Ada, MD (Confirm with patient/family/NH records and if not entered, this has to be entered at [facility] point of entry) Patient coming from: Home  I have personally briefly reviewed patient's old medical records in [place]  Chief Complaint: SOB, feeling tired  HPI: [name] is a 62 y.o. male with medical history significant of CHF status post heart transplant in 2014, on chronic immunosuppressant, ESRD on HD MWF, HTN, HLD, PAF on Eliquis, obesity, chronic on narcotics, anxiety/depression, recently diagnosed dementia, brought in by family member for evaluation of worsening of generalized weakness lethargy and confusion.  Patient complained about nausea and vomited several times on Sunday stomach content none bile no blood and started to choke the same evening and next day, patient started to feel very sleepy and generalized weakness.  He was able to completed Mondays dialysis.  Since Tuesday, patient became more lethargic and slept the whole day, wife reported that she heard patient breathing was "gurgly" but no cough, and no fever was found by family and yesterday patient started to have a Lippard breathing and did not go to scheduled routine dialysis.  Recently last month, patient was diagnosed with dementia by neurology.  Family does report patient has occasional sundowning with episode of confusion and delirium toward the end of the day but no particularly worsening during the last 4 days.  ED, patient was found to be hypoxic stabilized on 7 L via high flow oxygen.  Blood pressure borderline low but according to patient's family his blood pressure SBP in the range of 80-90s.  Borderline tachycardia, afebrile.  Chest x-ray showed right lower lobe pneumonia.  Blood work showed WBC 9.8, creatinine 10.3 BUN 42, bicarb 26, K4.9.  Patient was started on cefepime and vancomycin  Review of  Systems: Unable to perform, patient is confused.  Past Medical History:  Diagnosis Date   Arthritis    Atrial fibrillation (HCC)    CHF (congestive heart failure), NYHA class III (HCC)    s/p heart transplant   Chronic kidney disease    end stage   Chronic systolic dysfunction of left ventricle    CVA (cerebral infarction)    Diabetes mellitus    PMH; Prior to heart transplant   Family history of coronary artery disease    in both parents   GERD (gastroesophageal reflux disease)    Hyperlipidemia    Hypertension    Left bundle branch block    Morbid obesity (HCC)    status post lap band   Myocardial infarction [facility])    prior to heart transplant   Nonischemic cardiomyopathy ([place])    prior to heart transplant   Obesity (BMI 30-39.9)    Obstructive sleep apnea    no longer needs CPAP after heart transplant per pt   Premature ventricular contractions    Prostate cancer (HCC)    SOB (shortness of breath)     Past Surgical History:  Procedure Laterality Date   CARDIAC PACEMAKER PLACEMENT  09/21/2009   Biventricular implantable cardioverter-defibrillator implantation      COLONOSCOPY W/ BIOPSIES AND POLYPECTOMY     CYSTOSCOPY WITH FULGERATION N/A 04/27/2021   Procedure: CYSTOSCOPY AND CLOT EVACUATION WITH BLADDER BIOPSY/ FUGARATION OF BLADDER/ FULGARATION OF PROSTATE ;  Surgeon: [name], MD;  Location: [facility];  Service: Urology;  Laterality: N/A;   FOOT SURGERY     left   HEART TRANSPLANT  2014   LAPAROSCOPIC GASTRIC BANDING  01/27/2007   LEFT VENTRICULAR ASSIST DEVICE     implanted at [place] Right 07/22/2017   Procedure: RIGHT TOTAL HIP ARTHROPLASTY ANTERIOR APPROACH;  Surgeon: Rod Can, MD;  Location: [facility];  Service: Orthopedics;  Laterality: Right;  Needs RNFA   TOTAL KNEE ARTHROPLASTY Right 07/22/2017     reports that he has never smoked. He has quit using smokeless tobacco.  His  smokeless tobacco use included snuff. He reports that he does not drink alcohol and does not use drugs.  Allergies  Allergen Reactions   Heparin Nausea Only, Swelling and Other (See Comments)    * * HIT * *  SWELLING REACTION UNSPECIFIED   DIAPHORESIS    Iodinated Contrast Media    Penicillin G Potassium [Penicillin G] Nausea Only and Other (See Comments)    DIAPHORESIS    Family History  Problem Relation Age of Onset   Coronary artery disease Father        had, PTCA & CABG   Heart attack Father    Hypertension Father    Diabetes Father    Prostate cancer Father    Coronary artery disease Mother    Heart attack Mother    Hypertension Mother    Stroke Mother    Lupus Mother    Prostate cancer Brother    Coronary artery disease Brother    Coronary artery disease Sister    Heart attack Sister    Prostate cancer Paternal Uncle    Coronary artery disease Maternal Grandmother    Coronary artery disease Maternal Grandfather    Coronary artery disease Paternal Grandmother    Coronary artery disease Paternal Grandfather      Prior to Admission medications   Medication Sig Start Date End Date Taking? Authorizing Provider  hydrocortisone (ANUSOL-HC) 2.5 % rectal cream Apply 1 Application topically 2 (two) times daily. 11/01/22  Yes [provider]  traZODone (DESYREL) 100 MG tablet Take 100 mg by mouth at bedtime. 08/18/22  Yes [provider]  acetaminophen (TYLENOL) 325 MG tablet Take 2 tablets (650 mg total) by mouth every 6 (six) hours. 07/06/22   Nita Sells, MD  cinacalcet (SENSIPAR) 60 MG tablet Take 60 mg by mouth daily. Patient not taking: Reported on 07/04/2022 04/19/21   [provider]  cycloSPORINE modified (NEORAL) 25 MG capsule Take 75 mg by mouth See admin instructions. Take With 100 mg tab for a total of 175 mg twice a day    [provider]  divalproex (DEPAKOTE) 250 MG DR tablet Take 250 mg by mouth. 250mg  am, 500 pm     [provider]  DULoxetine (CYMBALTA) 60 MG capsule Take 1 capsule (60 mg total) by mouth daily. 11/01/22   Marcial Pacas, MD  ELIQUIS 2.5 MG TABS tablet Take 1 tablet (2.5 mg total) by mouth 2 (two) times daily. 04/30/21   [name], MD  lidocaine (LIDODERM) 5 % Place 1 patch onto the skin daily. Remove & Discard patch within 12 hours or as directed by MD 06/24/22   Godfrey Pick, MD  lidocaine (XYLOCAINE) 5 % ointment Apply 1 application topically as needed. 04/14/19   Jamse Arn, MD  lidocaine-prilocaine (EMLA) cream Apply 1 application topically as needed. Patient not taking: Reported on 07/04/2022    [provider]  methocarbamol (ROBAXIN) 500 MG tablet Take 1 tablet (500 mg  total) by mouth every 8 (eight) hours as needed for muscle spasms. 06/24/22   Godfrey Pick, MD  midodrine (PROAMATINE) 10 MG tablet Take 1 tablet (10 mg total) by mouth 3 (three) times daily. 04/27/21   [name], MD  mycophenolate (MYFORTIC) 180 MG EC tablet Take 720 mg by mouth 2 (two) times daily.    [provider]  ondansetron (ZOFRAN) 4 MG tablet Take 1 tablet (4 mg total) by mouth every 6 (six) hours as needed for nausea. 07/23/17   Swinteck, Aaron Edelman, MD  oxybutynin (DITROPAN) 5 MG tablet Take 1 tablet (5 mg total) by mouth every 8 (eight) hours as needed for bladder spasms. 04/23/21   Davonna Belling, MD  oxyCODONE (OXY IR/ROXICODONE) 5 MG immediate release tablet Take 1 tablet (5 mg total) by mouth every 4 (four) hours as needed for moderate pain or severe pain (5mg  moderate, 10mg  severe). 07/06/22   Nita Sells, MD  pregabalin (LYRICA) 300 MG capsule Take 1 capsule (300 mg total) by mouth daily. 07/06/22   Nita Sells, MD  rosuvastatin (CRESTOR) 10 MG tablet Take 1 tablet (10 mg total) by mouth daily. 07/06/22   Nita Sells, MD  sevelamer carbonate (RENVELA) 800 MG tablet Take 2 tablets (1,600 mg total) by mouth 3 (three) times daily with meals. 11/11/18    Kayleen Memos, DO    Physical Exam: Vitals:   11/29/22 1400 11/29/22 1415 11/29/22 1430 11/29/22 1445  BP: (!) 95/54 (!) 90/57 (!) 98/56 103/64  Pulse: 89 (!) 265 95 97  Resp: 20 (!) 23 14 (!) 9  Temp:      TempSrc:      SpO2: 97% 94% 100% 99%  Weight:      Height:        Constitutional: NAD, calm, comfortable Vitals:   11/29/22 1400 11/29/22 1415 11/29/22 1430 11/29/22 1445  BP: (!) 95/54 (!) 90/57 (!) 98/56 103/64  Pulse: 89 (!) 265 95 97  Resp: 20 (!) 23 14 (!) 9  Temp:      TempSrc:      SpO2: 97% 94% 100% 99%  Weight:      Height:       Eyes: PERRL, lids and conjunctivae normal ENMT: Mucous membranes are moist. Posterior pharynx clear of any exudate or lesions.Normal dentition.  Neck: normal, supple, no masses, no thyromegaly Respiratory: clear to auscultation bilaterally, no wheezing, bilateral crackles right more than left predominantly on the lower fields.  Increasing respiratory effort. No accessory muscle use.  Cardiovascular: Regular rate and rhythm, no murmurs / rubs / gallops. No extremity edema. 2+ pedal pulses. No carotid bruits.  Abdomen: no tenderness, no masses palpated. No hepatosplenomegaly. Bowel sounds positive.  Musculoskeletal: no clubbing / cyanosis. No joint deformity upper and lower extremities. Good ROM, no contractures. Normal muscle tone.  Skin: no rashes, lesions, ulcers. No induration Neurologic: No facial droops, moving all limbs, no acute focal deficit Psychiatric: Easily arousable, oriented to himself, confused about time and place    Labs on Admission: I have personally reviewed following labs and imaging studies  CBC: Recent Labs  Lab 11/29/22 1019 11/29/22 1037  WBC 9.8  --   NEUTROABS 8.1*  --   HGB 9.5* 10.2*  HCT 28.3* 30.0*  MCV 94.3  --   PLT 206  --    Basic Metabolic Panel: Recent Labs  Lab 11/29/22 1019 11/29/22 1037  NA 137 133*  K 4.9 4.7  CL 96*  --   CO2 26  --  GLUCOSE 92  --   BUN 42*  --    CREATININE 10.32*  --   CALCIUM 10.7*  --    GFR: Estimated Creatinine Clearance: 11.6 mL/min (A) (by C-G formula based on SCr of 10.32 mg/dL (H)). Liver Function Tests: Recent Labs  Lab 11/29/22 1019  AST 14*  ALT 18  ALKPHOS 85  BILITOT 1.1  PROT 6.2*  ALBUMIN 2.4*   No results for input(s): "LIPASE", "AMYLASE" in the last 168 hours. No results for input(s): "AMMONIA" in the last 168 hours. Coagulation Profile: Recent Labs  Lab 11/29/22 1019  INR 1.8*   Cardiac Enzymes: No results for input(s): "CKTOTAL", "CKMB", "CKMBINDEX", "TROPONINI" in the last 168 hours. BNP (last 3 results) No results for input(s): "PROBNP" in the last 8760 hours. HbA1C: No results for input(s): "HGBA1C" in the last 72 hours. CBG: Recent Labs  Lab 11/29/22 1044  GLUCAP 75   Lipid Profile: No results for input(s): "CHOL", "HDL", "LDLCALC", "TRIG", "CHOLHDL", "LDLDIRECT" in the last 72 hours. Thyroid Function Tests: No results for input(s): "TSH", "T4TOTAL", "FREET4", "T3FREE", "THYROIDAB" in the last 72 hours. Anemia Panel: No results for input(s): "VITAMINB12", "FOLATE", "FERRITIN", "TIBC", "IRON", "RETICCTPCT" in the last 72 hours. Urine analysis:    Component Value Date/Time   COLORURINE YELLOW 09/07/2008 1329   APPEARANCEUR CLEAR 09/07/2008 1329   LABSPEC 1.011 09/07/2008 1329   PHURINE 7.0 09/07/2008 1329   GLUCOSEU NEGATIVE 09/07/2008 1329   HGBUR NEGATIVE 09/07/2008 1329   BILIRUBINUR NEGATIVE 09/07/2008 1329   KETONESUR NEGATIVE 09/07/2008 1329   PROTEINUR NEGATIVE 09/07/2008 1329   UROBILINOGEN 1.0 09/07/2008 1329   NITRITE NEGATIVE 09/07/2008 1329   LEUKOCYTESUR  09/07/2008 1329    NEGATIVE MICROSCOPIC NOT DONE ON URINES WITH NEGATIVE PROTEIN, BLOOD, LEUKOCYTES, NITRITE, OR GLUCOSE <1000 mg/dL.    Radiological Exams on Admission: CT Head Wo Contrast  Result Date: 11/29/2022 CLINICAL DATA:  Mental status change. Lethargy. Dialysis patient. Heart transplant. EXAM: CT  HEAD WITHOUT CONTRAST TECHNIQUE: Contiguous axial images were obtained from the base of the skull through the vertex without intravenous contrast. RADIATION DOSE REDUCTION: This exam was performed according to the departmental dose-optimization program which includes automated exposure control, adjustment of the mA and/or kV according to patient size and/or use of iterative reconstruction technique. COMPARISON:  07/04/2022 FINDINGS: Brain: Moderate low density in the periventricular white matter likely related to small vessel disease. Right frontal hypoattenuation, including on 21/3 is similar to the prior. Mild age advanced cerebral atrophy. No mass lesion, hemorrhage, hydrocephalus, acute infarct, intra-axial, or extra-axial fluid collection. Vascular: No hyperdense vessel or unexpected calcification. Skull: No significant soft tissue swelling.  No skull fracture. Sinuses/Orbits: Normal imaged portions of the orbits and globes. Clear paranasal sinuses and mastoid air cells. Other: None. IMPRESSION: 1.  No acute intracranial abnormality. 2. Similar appearance of right frontal hypoattenuation which is secondary to remote infarct when compared to 11/09/2018 brain MR report. 3.  Cerebral atrophy and small vessel ischemic change. Electronically Signed   By: Abigail Miyamoto M.D.   On: 11/29/2022 13:50   DG Chest Port 1 View  Result Date: 11/29/2022 CLINICAL DATA:  Questionable sepsis EXAM: PORTABLE CHEST 1 VIEW COMPARISON:  X-ray 07/05/2022 FINDINGS: Postop chest with sternal wires. Enlarged cardiopericardial silhouette with vascular congestion. There is some lung base opacities identified which are similar to previous. Left retrocardiac greater than right. No pneumothorax or effusion. Film is rotated to the left. Overlapping cardiac leads. Right-sided rib fractures again seen. IMPRESSION: Underinflation with lung  base parenchymal opacities. Atelectasis versus infiltrate. Postop chest with enlarged heart and central  vascular congestion. Electronically Signed   By: Jill Side M.D.   On: 11/29/2022 11:13    EKG: Independently reviewed.  Sinus, chronic RBBB, no acute ST changes.  Assessment/Plan Principal Problem:   PNA (pneumonia) Active Problems:   Atrial fibrillation   Acute encephalopathy   AMS (altered mental status)   Aspiration pneumonia  (please populate well all problems here in Problem List. (For example, if patient is on BP meds at home and you resume or decide to hold them, it is a problem that needs to be her. Same for CAD, COPD, HLD and so on)  Acute hypoxic respiratory failure -Secondary to aspiration pneumonia -Pharmacy consulted for redosing vancomycin and cefepime.  MRSA screening, if negative, consider discontinue vancomycin. -Aspiration precaution -Breathing treatment, DuoNeb every 6 hours, incentive spirometry and flutter valve -Culture sputum  Sepsis -Evidenced by tachypneic, and hypoxic with symptoms signs of endorgan damage of acute encephalopathy -CT head reassuring -Patient appears to be euvolemic to mild volume contraction, given history of ESRD on HD, will not start maintenance IV fluid.  As patient now is more awake, encourage increased p.o. fluid intake. -Antibiotics as above  Aspiration pneumonia -Antibiotics as above -CT chest without contrast ordered to better characterize pneumonia and rule out abscess.  Acute metabolic encephalopathy -Secondary to sepsis, management as above.  History if heart transplant -Last seen by [facility] heart failure and transplant team in November 2023 and was considered to be stable.  Continue current immune suppressant regimen including cyclosporine and mycophenolate  ESRD on HD MWF -Discussed with nephrology, expect routine hemodialysis tomorrow  IDDM -Sliding scale for now  PAF -Sinus rhythm, continue Eliquis  Hypotension -Family reported blood pressure appears to be at baseline, continue home regimen of midodrine  Hx of  stroke -Stable, continue Eliquis  DVT prophylaxis: Eliquis Code Status: Full code Family Communication: Wife and daughter at bedside Disposition Plan: Patient is sick with sepsis aspiration pneumonia recurrent IV antibiotic and inpatient treatment, expect more than 2 midnight hospital stay Consults called: Nephrology Admission status: PCU  Lequita Halt MD [facility] Pager [phone]  11/29/2022, 3:16 PM

## 2022-11-29 NOTE — ED Provider Notes (Signed)
[facility] Provider Note   CSN: [id number] Arrival date & time: 11/29/22  1008     History  Chief Complaint  Patient presents with   Shortness of Breath    [name] is a 62 y.o. male.  HPI 62 year old male with multiple comorbidities which include a heart transplant, ESRD on dialysis, stroke, diabetes, and multiple other comorbidities presents with altered mental status and hypoxia.  History is primarily from EMS.  The patient has recently been diagnosed with some dementia as well.  Over the last several days he has not been doing well and has had periods of lethargy and altered mental status.  He vomited a few days ago.  He skipped dialysis earlier in the week and then did not go yesterday because he did not want to.  He was found to be hypoxic today when called for altered mental status and his sats were in the 69s with fire department.  EMS tried to wean him down to a nasal cannula but he was hypoxic into the 80s so they had to put him up to a nonrebreather.  Right now the patient states he feels okay and denies any acute complaints.  He is disoriented.  Home Medications Prior to Admission medications   Medication Sig Start Date End Date Taking? Authorizing Provider  acetaminophen (TYLENOL) 325 MG tablet Take 2 tablets (650 mg total) by mouth every 6 (six) hours. 07/06/22   Nita Sells, MD  cinacalcet (SENSIPAR) 60 MG tablet Take 60 mg by mouth daily. Patient not taking: Reported on 07/04/2022 04/19/21   [provider]  cycloSPORINE modified (NEORAL) 100 MG capsule Take 100 mg by mouth See admin instructions. Take with three 25 mg  (75 ) for a total of 175 mg twice a day    [provider]  cycloSPORINE modified (NEORAL) 25 MG capsule Take 75 mg by mouth See admin instructions. Take With 100 mg tab for a total of 175 mg twice a day    [provider]  divalproex (DEPAKOTE) 250 MG DR tablet Take 250 mg  by mouth. 250mg  am, 500 pm    [provider]  DULoxetine (CYMBALTA) 60 MG capsule Take 60 mg by mouth daily. 10/25/22   [provider]  DULoxetine (CYMBALTA) 60 MG capsule Take 1 capsule (60 mg total) by mouth daily. 11/01/22   Marcial Pacas, MD  ELIQUIS 2.5 MG TABS tablet Take 1 tablet (2.5 mg total) by mouth 2 (two) times daily. 04/30/21   [name], MD  lidocaine (LIDODERM) 5 % Place 1 patch onto the skin daily. Remove & Discard patch within 12 hours or as directed by MD 06/24/22   Godfrey Pick, MD  lidocaine (XYLOCAINE) 5 % ointment Apply 1 application topically as needed. 04/14/19   Jamse Arn, MD  lidocaine-prilocaine (EMLA) cream Apply 1 application topically as needed. Patient not taking: Reported on 07/04/2022    [provider]  methocarbamol (ROBAXIN) 500 MG tablet Take 1 tablet (500 mg total) by mouth every 8 (eight) hours as needed for muscle spasms. 06/24/22   Godfrey Pick, MD  midodrine (PROAMATINE) 10 MG tablet Take 1 tablet (10 mg total) by mouth 3 (three) times daily. 04/27/21   [name], MD  mycophenolate (MYFORTIC) 180 MG EC tablet Take 720 mg by mouth 2 (two) times daily.    [provider]  ondansetron (ZOFRAN) 4 MG tablet Take 1 tablet (4 mg total) by mouth every  6 (six) hours as needed for nausea. 07/23/17   Swinteck, Aaron Edelman, MD  oxybutynin (DITROPAN) 5 MG tablet Take 1 tablet (5 mg total) by mouth every 8 (eight) hours as needed for bladder spasms. 04/23/21   Davonna Belling, MD  oxyCODONE (OXY IR/ROXICODONE) 5 MG immediate release tablet Take 1 tablet (5 mg total) by mouth every 4 (four) hours as needed for moderate pain or severe pain (5mg  moderate, 10mg  severe). 07/06/22   Nita Sells, MD  pregabalin (LYRICA) 300 MG capsule Take 1 capsule (300 mg total) by mouth daily. 07/06/22   Nita Sells, MD  rosuvastatin (CRESTOR) 10 MG tablet Take 1 tablet (10 mg total) by mouth daily. 07/06/22   Nita Sells,  MD  sevelamer carbonate (RENVELA) 800 MG tablet Take 2 tablets (1,600 mg total) by mouth 3 (three) times daily with meals. 11/11/18   Kayleen Memos, DO      Allergies    Heparin, Iodinated contrast media, and Penicillin g potassium [penicillin g]    Review of Systems   Review of Systems  Unable to perform ROS: Mental status change    Physical Exam Updated Vital Signs BP (!) 76/47   Pulse 94   Temp 98.4 F (36.9 C)   Resp 20   Ht 6\' 6"  (1.981 m)   Wt (!) 140 kg   SpO2 100%   BMI 35.67 kg/m  Physical Exam Vitals and nursing note reviewed.  Constitutional:      Appearance: He is well-developed. He is ill-appearing. He is not diaphoretic.  HENT:     Head: Normocephalic and atraumatic.  Cardiovascular:     Rate and Rhythm: Normal rate and regular rhythm.     Heart sounds: Normal heart sounds.  Pulmonary:     Effort: Pulmonary effort is normal. Tachypnea present.     Breath sounds: Rhonchi present.     Comments: On a NRB Abdominal:     Palpations: Abdomen is soft.     Tenderness: There is no abdominal tenderness.  Musculoskeletal:     Right lower leg: Edema present.     Left lower leg: Edema present.     Comments: Dialysis fistula in R forearm with good thrill  Skin:    General: Skin is warm and dry.  Neurological:     Mental Status: He is alert.     Comments: Patient will wake up to voice calling his name.  He will give some slow answers.  He will closes eyes when done talking.  He does follow commands including moving all 4 extremities which are equal bilaterally.     ED Results / Procedures / Treatments   Labs (all labs ordered are listed, but only abnormal results are displayed) Labs Reviewed  COMPREHENSIVE METABOLIC PANEL - Abnormal; Notable for the following components:      Result Value   Chloride 96 (*)    BUN 42 (*)    Creatinine, Ser 10.32 (*)    Calcium 10.7 (*)    Total Protein 6.2 (*)    Albumin 2.4 (*)    AST 14 (*)    GFR, Estimated 5 (*)     All other components within normal limits  CBC WITH DIFFERENTIAL/PLATELET - Abnormal; Notable for the following components:   RBC 3.00 (*)    Hemoglobin 9.5 (*)    HCT 28.3 (*)    RDW 16.8 (*)    Neutro Abs 8.1 (*)    Lymphs Abs 0.6 (*)    Monocytes Absolute  1.1 (*)    All other components within normal limits  PROTIME-INR - Abnormal; Notable for the following components:   Prothrombin Time 20.3 (*)    INR 1.8 (*)    All other components within normal limits  APTT - Abnormal; Notable for the following components:   aPTT 47 (*)    All other components within normal limits  BRAIN NATRIURETIC PEPTIDE - Abnormal; Notable for the following components:   B Natriuretic Peptide 1,835.6 (*)    All other components within normal limits  I-STAT ARTERIAL BLOOD GAS, ED - Abnormal; Notable for the following components:   pO2, Arterial 24 (*)    Bicarbonate 29.0 (*)    Acid-Base Excess 4.0 (*)    Sodium 133 (*)    HCT 30.0 (*)    Hemoglobin 10.2 (*)    All other components within normal limits  TROPONIN I (HIGH SENSITIVITY) - Abnormal; Notable for the following components:   Troponin I (High Sensitivity) 58 (*)    All other components within normal limits  TROPONIN I (HIGH SENSITIVITY) - Abnormal; Notable for the following components:   Troponin I (High Sensitivity) 58 (*)    All other components within normal limits  RESP PANEL BY RT-PCR (RSV, FLU A&B, COVID)  RVPGX2  CULTURE, BLOOD (ROUTINE X 2)  CULTURE, BLOOD (ROUTINE X 2)  MRSA NEXT GEN BY PCR, NASAL  EXPECTORATED SPUTUM ASSESSMENT W GRAM STAIN, RFLX TO RESP C  LACTIC ACID, PLASMA  LACTIC ACID, PLASMA  URINALYSIS, W/ REFLEX TO CULTURE (INFECTION SUSPECTED)  STREP PNEUMONIAE URINARY ANTIGEN  CBG MONITORING, ED    EKG EKG Interpretation  Date/Time:  Thursday November 29 2022 10:16:55 EDT Ventricular Rate:  98 PR Interval:  170 QRS Duration: 150 QT Interval:  361 QTC Calculation: 461 R Axis:   -73 Text Interpretation: Sinus  rhythm RBBB and LAFB no significant change since Nov 2023 Confirmed by Sherwood Gambler [phone]) on 11/29/2022 10:48:45 AM  Radiology CT Head Wo Contrast  Result Date: 11/29/2022 CLINICAL DATA:  Mental status change. Lethargy. Dialysis patient. Heart transplant. EXAM: CT HEAD WITHOUT CONTRAST TECHNIQUE: Contiguous axial images were obtained from the base of the skull through the vertex without intravenous contrast. RADIATION DOSE REDUCTION: This exam was performed according to the departmental dose-optimization program which includes automated exposure control, adjustment of the mA and/or kV according to patient size and/or use of iterative reconstruction technique. COMPARISON:  07/04/2022 FINDINGS: Brain: Moderate low density in the periventricular white matter likely related to small vessel disease. Right frontal hypoattenuation, including on 21/3 is similar to the prior. Mild age advanced cerebral atrophy. No mass lesion, hemorrhage, hydrocephalus, acute infarct, intra-axial, or extra-axial fluid collection. Vascular: No hyperdense vessel or unexpected calcification. Skull: No significant soft tissue swelling.  No skull fracture. Sinuses/Orbits: Normal imaged portions of the orbits and globes. Clear paranasal sinuses and mastoid air cells. Other: None. IMPRESSION: 1.  No acute intracranial abnormality. 2. Similar appearance of right frontal hypoattenuation which is secondary to remote infarct when compared to 11/09/2018 brain MR report. 3.  Cerebral atrophy and small vessel ischemic change. Electronically Signed   By: Abigail Miyamoto M.D.   On: 11/29/2022 13:50   DG Chest Port 1 View  Result Date: 11/29/2022 CLINICAL DATA:  Questionable sepsis EXAM: PORTABLE CHEST 1 VIEW COMPARISON:  X-ray 07/05/2022 FINDINGS: Postop chest with sternal wires. Enlarged cardiopericardial silhouette with vascular congestion. There is some lung base opacities identified which are similar to previous. Left retrocardiac greater than  right.  No pneumothorax or effusion. Film is rotated to the left. Overlapping cardiac leads. Right-sided rib fractures again seen. IMPRESSION: Underinflation with lung base parenchymal opacities. Atelectasis versus infiltrate. Postop chest with enlarged heart and central vascular congestion. Electronically Signed   By: Jill Side M.D.   On: 11/29/2022 11:13    Procedures .Critical Care  Performed by: Sherwood Gambler, MD Authorized by: Sherwood Gambler, MD   Critical care provider statement:    Critical care time (minutes):  35   Critical care was necessary to treat or prevent imminent or life-threatening deterioration of the following conditions:  Respiratory failure, sepsis and shock   Critical care was time spent personally by me on the following activities:  Development of treatment plan with patient or surrogate, discussions with consultants, evaluation of patient's response to treatment, examination of patient, ordering and review of laboratory studies, ordering and review of radiographic studies, ordering and performing treatments and interventions, pulse oximetry, re-evaluation of patient's condition and review of old charts     Medications Ordered in ED Medications  midodrine (PROAMATINE) tablet 10 mg (10 mg Oral Given 11/29/22 1151)  acetaminophen (TYLENOL) tablet 650 mg (has no administration in time range)  oxyCODONE (Oxy IR/ROXICODONE) immediate release tablet 5 mg (has no administration in time range)  rosuvastatin (CRESTOR) tablet 10 mg (has no administration in time range)  DULoxetine (CYMBALTA) DR capsule 60 mg (has no administration in time range)  sevelamer carbonate (RENVELA) tablet 1,600 mg (has no administration in time range)  oxybutynin (DITROPAN) tablet 5 mg (has no administration in time range)  apixaban (ELIQUIS) tablet 2.5 mg (has no administration in time range)  cycloSPORINE modified (NEORAL) capsule 100 mg (has no administration in time range)  mycophenolate  (MYFORTIC) EC tablet 720 mg (has no administration in time range)  divalproex (DEPAKOTE) DR tablet 375 mg (has no administration in time range)  methocarbamol (ROBAXIN) tablet 500 mg (has no administration in time range)  pregabalin (LYRICA) capsule 300 mg (has no administration in time range)  ondansetron (ZOFRAN) tablet 4 mg (has no administration in time range)    Or  ondansetron (ZOFRAN) injection 4 mg (has no administration in time range)  senna-docusate (Senokot-S) tablet 1 tablet (has no administration in time range)  ipratropium-albuterol (DUONEB) 0.5-2.5 (3) MG/3ML nebulizer solution 3 mL (has no administration in time range)  guaiFENesin (MUCINEX) 12 hr tablet 1,200 mg (has no administration in time range)  ceFEPIme (MAXIPIME) 2 g in sodium chloride 0.9 % 100 mL IVPB (0 g Intravenous Stopped 11/29/22 1128)  vancomycin (VANCOCIN) 2,500 mg in sodium chloride 0.9 % 500 mL IVPB (2,500 mg Intravenous New Bag/Given 11/29/22 1150)  sodium chloride 0.9 % bolus 500 mL (500 mLs Intravenous New Bag/Given 11/29/22 1149)  sodium chloride 0.9 % bolus 500 mL (500 mLs Intravenous New Bag/Given 11/29/22 1324)    ED Course/ Medical Decision Making/ A&P Clinical Course as of 11/29/22 1441  Thu Nov 29, 2022  1150 Patient is more awake and alert than he was when he first came here.  I do not think he will need BiPAP or intubation at this time.  Otherwise, his blood pressure is downtrending which I think is at least partially related to midodrine as his wife thinks he threw up the dose last night and has not yet taken it today.  He takes 10 mg 3 times daily.  Will give a dose now as well as a small bolus of fluids.  I am not sure if his hypotension is  more midodrine rather than sepsis.  He is being covered for pneumonia. [SG]    Clinical Course User Index [SG] Sherwood Gambler, MD                             Medical Decision Making Amount and/or Complexity of Data Reviewed Labs: ordered.    Details: No  respiratory acidosis.  Elevated troponins likely more from his creatinine than ACS.  Normal lactate and normal WBC. Radiology: ordered and independent interpretation performed.    Details: Likely pneumonia No head bleed ECG/medicine tests: ordered and independent interpretation performed.    Details: Unchanged from baseline  Risk Prescription drug management. Decision regarding hospitalization.   Patient is ill-appearing on arrival though his mental status actually improved significantly prior to any significant intervention.  He is now talking more though still seems altered according to wife.  Sound like he vomited last night and was given his home midodrine and some small fluid boluses.  I suspect there is likely a pneumonia component given the acute worsening in cough and vomiting.  Could be related to fluid overload but the symptoms started before he missed dialysis yesterday.  His blood pressures have responded to the midodrine and some fluids and his blood pressure on my last check was around 96 systolic.  He will need admission and I have discussed with Dr. Roosevelt Locks.        Final Clinical Impression(s) / ED Diagnoses Final diagnoses:  Acute respiratory failure with hypoxia    Rx / DC Orders ED Discharge Orders     None         Sherwood Gambler, MD 11/29/22 1442

## 2022-11-29 NOTE — Consult Note (Signed)
Renal Service Consult Note [facility]  [name] 11/29/2022 [name], MD Requesting Physician: Dr. Roosevelt Locks  Reason for Consult: ESRD pt w/ PNA and FTT  HPI: The patient is a 62 y.o. year-old w/ PMH as below who presented to ED due to 2-3 day hx of lethargy and AMS. Then was vomiting and slumped over 3/31. Hx heart transplant in 2014 and esrd on HD since around 2015. In ED BP's 98/56, 68% on RA, rhonchi in all chest fields. Required a NRB mask as [place] SpO2's were in the 80's. Pt has low BP's normally and takes midodrine at home for BP support. CXR shows basilar infiltrates, CT chest showed more obvious R > L basilar consolidation c/w pneumonia.  Pt rec'd IV abx and is being admitted to medical service. We are asked to see for ESRD.    Pt seen in ED room, pt not real coherent or helpful w/ history. Dtr and wife at bedside give hx: sometime late last year he started to develop "shakes" and tremors of the arms and other parts of the body. They took him to [facility] but the wait for the movement neuro team was too long so they took him to [facility] neurology where the said he didn't have Parkinsons. Had been on lyrica and gabapentin and the gabapentin was stopped. Then he started to have falls and had broken fibs related to falls. Now over the last 3-4 months he has deteriorated physically w/ progressive debility and WC depedence, muscle mass loss, and sig weight loss. He now also has hallucinations that come and go and dementia/ sig memory issues. He continues to have tremors of the R side mostly (his "stroke side").  His BP's run soft in the 90s-100s, he takes midodrine prior to HD.  Has had issues w/ passing out as well, some post HD.  H/o of heart transplant as well.    ROS - denies CP, no joint pain, no HA, no blurry vision, no rash, no diarrhea, no nausea/ vomiting, no dysuria, no difficulty voiding   Past Medical History  Past Medical History:  Diagnosis Date   Arthritis     Atrial fibrillation (HCC)    CHF (congestive heart failure), NYHA class III (HCC)    s/p heart transplant   Chronic kidney disease    end stage   Chronic systolic dysfunction of left ventricle    CVA (cerebral infarction)    Diabetes mellitus    PMH; Prior to heart transplant   Family history of coronary artery disease    in both parents   GERD (gastroesophageal reflux disease)    Hyperlipidemia    Hypertension    Left bundle branch block    Morbid obesity ([place])    status post lap band   Myocardial infarction [facility])    prior to heart transplant   Nonischemic cardiomyopathy ([place])    prior to heart transplant   Obesity (BMI 30-39.9)    Obstructive sleep apnea    no longer needs CPAP after heart transplant per pt   Premature ventricular contractions    Prostate cancer (HCC)    SOB (shortness of breath)    Past Surgical History  Past Surgical History:  Procedure Laterality Date   CARDIAC PACEMAKER PLACEMENT  09/21/2009   Biventricular implantable cardioverter-defibrillator implantation      COLONOSCOPY W/ BIOPSIES AND POLYPECTOMY     CYSTOSCOPY WITH FULGERATION N/A 04/27/2021   Procedure: CYSTOSCOPY AND CLOT EVACUATION WITH BLADDER BIOPSY/ FUGARATION OF  BLADDER/ FULGARATION OF PROSTATE ;  Surgeon: [name], MD;  Location: [facility];  Service: Urology;  Laterality: N/A;   FOOT SURGERY     left   HEART TRANSPLANT  2014   LAPAROSCOPIC GASTRIC BANDING  01/27/2007   LEFT VENTRICULAR ASSIST DEVICE     implanted at [place] Right 07/22/2017   Procedure: RIGHT TOTAL HIP ARTHROPLASTY ANTERIOR APPROACH;  Surgeon: Rod Can, MD;  Location: [place];  Service: Orthopedics;  Laterality: Right;  Needs RNFA   TOTAL KNEE ARTHROPLASTY Right 07/22/2017   Family History  Family History  Problem Relation Age of Onset   Coronary artery disease Father        had, PTCA & CABG   Heart attack Father    Hypertension Father     Diabetes Father    Prostate cancer Father    Coronary artery disease Mother    Heart attack Mother    Hypertension Mother    Stroke Mother    Lupus Mother    Prostate cancer Brother    Coronary artery disease Brother    Coronary artery disease Sister    Heart attack Sister    Prostate cancer Paternal Uncle    Coronary artery disease Maternal Grandmother    Coronary artery disease Maternal Grandfather    Coronary artery disease Paternal Grandmother    Coronary artery disease Paternal Grandfather    Social History  reports that he has never smoked. He has quit using smokeless tobacco.  His smokeless tobacco use included snuff. He reports that he does not drink alcohol and does not use drugs. Allergies  Allergies  Allergen Reactions   Heparin Nausea Only, Swelling and Other (See Comments)    * * HIT * *  SWELLING REACTION UNSPECIFIED   DIAPHORESIS    Iodinated Contrast Media    Penicillin G Potassium [Penicillin G] Nausea Only and Other (See Comments)    DIAPHORESIS   Home medications Prior to Admission medications   Medication Sig Start Date End Date Taking? Authorizing Provider  hydrocortisone (ANUSOL-HC) 2.5 % rectal cream Apply 1 Application topically 2 (two) times daily. 11/01/22  Yes [provider]  traZODone (DESYREL) 100 MG tablet Take 100 mg by mouth at bedtime. 08/18/22  Yes [provider]  acetaminophen (TYLENOL) 325 MG tablet Take 2 tablets (650 mg total) by mouth every 6 (six) hours. 07/06/22   Nita Sells, MD  cinacalcet (SENSIPAR) 60 MG tablet Take 60 mg by mouth daily. Patient not taking: Reported on 07/04/2022 04/19/21   [provider]  cycloSPORINE modified (NEORAL) 25 MG capsule Take 75 mg by mouth See admin instructions. Take With 100 mg tab for a total of 175 mg twice a day    [provider]  divalproex (DEPAKOTE) 250 MG DR tablet Take 250 mg by mouth. 250mg  am, 500 pm    [provider]  DULoxetine  (CYMBALTA) 60 MG capsule Take 1 capsule (60 mg total) by mouth daily. 11/01/22   Marcial Pacas, MD  ELIQUIS 2.5 MG TABS tablet Take 1 tablet (2.5 mg total) by mouth 2 (two) times daily. 04/30/21   [name], MD  lidocaine (LIDODERM) 5 % Place 1 patch onto the skin daily. Remove & Discard patch within 12 hours or as directed by MD 06/24/22   Godfrey Pick, MD  lidocaine (XYLOCAINE) 5 % ointment Apply 1 application topically as needed. 04/14/19  Jamse Arn, MD  lidocaine-prilocaine (EMLA) cream Apply 1 application topically as needed. Patient not taking: Reported on 07/04/2022    [provider]  methocarbamol (ROBAXIN) 500 MG tablet Take 1 tablet (500 mg total) by mouth every 8 (eight) hours as needed for muscle spasms. 06/24/22   Godfrey Pick, MD  midodrine (PROAMATINE) 10 MG tablet Take 1 tablet (10 mg total) by mouth 3 (three) times daily. 04/27/21   [name], MD  mycophenolate (MYFORTIC) 180 MG EC tablet Take 720 mg by mouth 2 (two) times daily.    [provider]  ondansetron (ZOFRAN) 4 MG tablet Take 1 tablet (4 mg total) by mouth every 6 (six) hours as needed for nausea. 07/23/17   Swinteck, Aaron Edelman, MD  oxybutynin (DITROPAN) 5 MG tablet Take 1 tablet (5 mg total) by mouth every 8 (eight) hours as needed for bladder spasms. 04/23/21   Davonna Belling, MD  oxyCODONE (OXY IR/ROXICODONE) 5 MG immediate release tablet Take 1 tablet (5 mg total) by mouth every 4 (four) hours as needed for moderate pain or severe pain (5mg  moderate, 10mg  severe). 07/06/22   Nita Sells, MD  pregabalin (LYRICA) 300 MG capsule Take 1 capsule (300 mg total) by mouth daily. 07/06/22   Nita Sells, MD  rosuvastatin (CRESTOR) 10 MG tablet Take 1 tablet (10 mg total) by mouth daily. 07/06/22   Nita Sells, MD  sevelamer carbonate (RENVELA) 800 MG tablet Take 2 tablets (1,600 mg total) by mouth 3 (three) times daily with meals. 11/11/18   Kayleen Memos, DO     Vitals:    11/29/22 1515 11/29/22 1535 11/29/22 1540 11/29/22 1545  BP: (!) 83/58 114/63 103/62 103/63  Pulse: 99   (!) 107  Resp: 18   19  Temp:      TempSrc:      SpO2: 99%   100%  Weight:      Height:       Exam Gen lethargic, arouses to voice, pleasant No rash, cyanosis or gangrene Sclera anicteric, throat clear  No jvd or bruits Chest bilat coarse rales at bases R > L  RRR no MRG Abd soft ntnd no mass or ascites +bs GU normal male MS no joint effusions or deformity Ext 1+ pitting bilat pretib edema, no hip or UE edema Neuro R arm tremors, awakens to voice then goes back asleep    RFA AVF+bruit   Home meds include - trazodone, sensipar 60 qd, neoral 175mg  bid, depakote, cymbalta, midodrine 10 tid, myfortic 720 bid, oxycodone prn, lyrica 300mg  qd, crestor, renvela 1600 ac tid, prns/ vits/ supps     OP HD: [facility]  4h  500/2.0   120kg   3K/2Ca bath  AVF   Hep none - last HD 4/01, post wt 120.3kg  - mircera 120mg  IV q 4 wks, last 3/29, due 4/26 - no vdra  CT chest - bilat dense basilar consolidation, no edema/ GG changes  CXR 4/4 - bilat basilar infiltrates vs atx, mild vasc congestion, no edema   Na 137  K 4.9  CO2 26  bUN 42  creat 10.3   alb 2.4  Ca 10.7  lft's ok    WBC 9K  Hb 9.5  Assessment/ Plan: AHRF/ bilateral PNA by CT - w/ cough, rhonchi, no sig fevers / wbc here. Possibly aspirating.  Tremors/ dementia/ debility - pt has become WC bound w/ deterioriating neuro condition over the last 3-6 mos per family.  Follows w/ [facility] neurology.  ESRD - on HD MWF. Missed HD Wednesday. Will plan HD tonight.  HTN/ volume - mild vol excess on exam. CT w/o any lung edema, and BP's soft. Get weights and will try 2 L UF w/ HD.  Chronic hypotension - takes midodrine 10mg  tid at home, continue here.  Anemia esrd - Hb 9-11 here, follow.  MBD ckd - CCa is a little high, not on any vdra or Ca++ containing binders. Get phos.   H/o heart transplant - takes CyA and myfortic  H/o  CVA H/o prostate cancer Atrial fib IDDM H/o charcot foot GOC - signficant decline per wife/ dtr over the last 6 mos. Has memory loss/ regular hallucinations, tremors, possible dysphagia, and physical decline now mostly WC bound. Encouraged wife to meet w/ palliative care, she is agreeable. Will consult PCT.      Kelly Splinter  MD [facility] 11/29/2022, 4:43 PM  Recent Labs  Lab 11/29/22 1019 11/29/22 1037  HGB 9.5* 10.2*  ALBUMIN 2.4*  --   CALCIUM 10.7*  --   CREATININE 10.32*  --   K 4.9 4.7   Inpatient medications:  acetaminophen  650 mg Oral Q6H   apixaban  2.5 mg Oral BID   cycloSPORINE modified  100 mg Oral See admin instructions   divalproex  375 mg Oral Q12H   DULoxetine  60 mg Oral Daily   guaiFENesin  1,200 mg Oral BID   ipratropium-albuterol  3 mL Nebulization Q6H   midodrine  10 mg Oral TID WC   mycophenolate  720 mg Oral BID   pregabalin  300 mg Oral Daily   rosuvastatin  10 mg Oral Daily   sevelamer carbonate  1,600 mg Oral TID WC    methocarbamol, ondansetron **OR** ondansetron (ZOFRAN) IV, oxybutynin, oxyCODONE, senna-docusate

## 2022-11-29 NOTE — Progress Notes (Signed)
CT chest reviewed, bilateral lower lobe pneumonia/consolidation, compatible with the aspiration event-on Sunday.  Continue current antibiotic treatment.  No significant lung abscess identified.

## 2022-11-30 DIAGNOSIS — J9601 Acute respiratory failure with hypoxia: Secondary | ICD-10-CM | POA: Diagnosis not present

## 2022-11-30 DIAGNOSIS — Z515 Encounter for palliative care: Secondary | ICD-10-CM

## 2022-11-30 DIAGNOSIS — I4891 Unspecified atrial fibrillation: Secondary | ICD-10-CM

## 2022-11-30 DIAGNOSIS — G9340 Encephalopathy, unspecified: Secondary | ICD-10-CM | POA: Diagnosis not present

## 2022-11-30 DIAGNOSIS — Z7189 Other specified counseling: Secondary | ICD-10-CM | POA: Diagnosis not present

## 2022-11-30 DIAGNOSIS — J189 Pneumonia, unspecified organism: Secondary | ICD-10-CM | POA: Diagnosis not present

## 2022-11-30 LAB — CBC
HCT: 24.8 % — ABNORMAL LOW (ref 39.0–52.0)
Hemoglobin: 8.1 g/dL — ABNORMAL LOW (ref 13.0–17.0)
MCH: 30.7 pg (ref 26.0–34.0)
MCHC: 32.7 g/dL (ref 30.0–36.0)
MCV: 93.9 fL (ref 80.0–100.0)
Platelets: 195 10*3/uL (ref 150–400)
RBC: 2.64 MIL/uL — ABNORMAL LOW (ref 4.22–5.81)
RDW: 16.7 % — ABNORMAL HIGH (ref 11.5–15.5)
WBC: 9.2 10*3/uL (ref 4.0–10.5)
nRBC: 0 % (ref 0.0–0.2)

## 2022-11-30 LAB — BASIC METABOLIC PANEL
Anion gap: 11 (ref 5–15)
BUN: 32 mg/dL — ABNORMAL HIGH (ref 8–23)
CO2: 25 mmol/L (ref 22–32)
Calcium: 9.6 mg/dL (ref 8.9–10.3)
Chloride: 97 mmol/L — ABNORMAL LOW (ref 98–111)
Creatinine, Ser: 7.14 mg/dL — ABNORMAL HIGH (ref 0.61–1.24)
GFR, Estimated: 8 mL/min — ABNORMAL LOW (ref >60)
Glucose, Bld: 79 mg/dL (ref 70–99)
Potassium: 4 mmol/L (ref 3.5–5.1)
Sodium: 133 mmol/L — ABNORMAL LOW (ref 135–145)

## 2022-11-30 LAB — HEPATITIS B SURFACE ANTIBODY, QUANTITATIVE: Hep B S AB Quant (Post): 77.9 m[IU]/mL (ref >9.9)

## 2022-11-30 MED ORDER — VANCOMYCIN HCL 500 MG/100ML IV SOLN
500.0000 mg | Freq: Once | INTRAVENOUS | Status: AC
  Administered 2022-11-30: 500 mg via INTRAVENOUS
  Filled 2022-11-30: qty 100

## 2022-11-30 MED ORDER — CYCLOSPORINE MODIFIED (GENGRAF) 100 MG PO CAPS
100.0000 mg | ORAL_CAPSULE | Freq: Every morning | ORAL | Status: DC
  Administered 2022-12-01 – 2022-12-05 (×5): 100 mg via ORAL
  Filled 2022-11-30: qty 1
  Filled 2022-11-30: qty 4
  Filled 2022-11-30: qty 1
  Filled 2022-11-30 (×2): qty 4
  Filled 2022-11-30: qty 1
  Filled 2022-11-30: qty 4

## 2022-11-30 MED ORDER — SODIUM CHLORIDE 0.9 % IV BOLUS
250.0000 mL | Freq: Once | INTRAVENOUS | Status: AC
  Administered 2022-11-30: 250 mL via INTRAVENOUS

## 2022-11-30 MED ORDER — CYCLOSPORINE MODIFIED (GENGRAF) 25 MG PO CAPS
75.0000 mg | ORAL_CAPSULE | Freq: Every day | ORAL | Status: DC
  Administered 2022-12-01 – 2022-12-04 (×4): 75 mg via ORAL
  Filled 2022-11-30 (×5): qty 3

## 2022-11-30 NOTE — Progress Notes (Signed)
PROGRESS NOTE    [name]  YVD:[id number] DOB: 25-Apr-1961 DOA: 11/29/2022 PCP: Merri Brunette, MD   Chief Complaint  Patient presents with   Shortness of Breath    Brief Narrative:   [name] is a 62 y.o. male with medical history significant of CHF status post heart transplant in 2014, on chronic immunosuppressant, ESRD on HD MWF, HTN, HLD, PAF on Eliquis, obesity, chronic on narcotics, anxiety/depression, recently diagnosed dementia, brought in by family member for evaluation of worsening of generalized weakness lethargy and confusion.   in ED, patient was found to be hypoxic stabilized on 7 L via high flow oxygen.  Blood pressure borderline low but according to patient's family his blood pressure SBP in the range of 80-90s.  Borderline tachycardia, afebrile.  Chest x-ray showed right lower lobe pneumonia.  Blood work showed WBC 9.8, creatinine 10.3 BUN 42, bicarb 26, K4.9.  Patient was started on cefepime and vancomycin   Assessment & Plan:   Principal Problem:   PNA (pneumonia) Active Problems:   Atrial fibrillation   Acute encephalopathy   AMS (altered mental status)   Aspiration pneumonia   Acute hypoxic respiratory failure -Secondary to aspiration pneumonia -Aspiration precaution -Breathing treatment, DuoNeb every 6 hours, incentive spirometry and flutter valve -Culture sputum -Encouraged to use incentive spirometry and flutter valve.   Sepsis secondary to multifocal pneumonia Sepsis present on admission -Evidenced by tachypneic, and hypoxic with symptoms signs of endorgan damage of acute encephalopathy, and lactic acid of 2.8 -Given he is immunocompromise, continue with broad-spectrum antibiotic IV vancomycin and cefepime. -CT chest significant for extensive bibasilar opacities  Acute metabolic encephalopathy -Secondary to sepsis, management as above.    History if heart transplant -Last seen by [facility] heart failure and transplant team in November 2023 and  was considered to be stable.  Continue current immune suppressant regimen including cyclosporine and mycophenolate   ESRD on HD MWF -Consulted, dialyzed overnight, he is on Monday Wednesday Friday schedule   IDDM -Sliding scale for now   PAF -Sinus rhythm, continue Eliquis   Hypotension -Family reported blood pressure appears to be at baseline, continue home regimen of midodrine   Hx of stroke -Stable, continue Eliquis      DVT prophylaxis: Eliquis Code Status: Full Family Communication: None at bedside Disposition:   Status is: Inpatient    Consultants:  renal  Subjective:  Patient was dialyzed earlier this morning, otherwise no significant events as discussed with staff.  Objective: Vitals:   11/30/22 0626 11/30/22 0639 11/30/22 0656 11/30/22 0900  BP: 93/64 101/66    Pulse: 80 97    Resp: 12 19  18   Temp:  98 F (36.7 C)    TempSrc:  Oral    SpO2: 97% 100%    Weight:   116.9 kg   Height:        Intake/Output Summary (Last 24 hours) at 11/30/2022 1006 Last data filed at 11/30/2022 0639 Gross per 24 hour  Intake --  Output 500 ml  Net -500 ml   Filed Weights   11/29/22 1020 11/30/22 0239 11/30/22 0656  Weight: (!) 140 kg 117.4 kg 116.9 kg    Examination:  Lethargic, but wakes up and answer questions, frail, chronically ill-appearing, deconditioned . symmetrical Chest wall movement, bibasilar rales and rhonchi RRR,No Gallops,Rubs or new Murmurs, No Parasternal Heave +ve B.Sounds, Abd Soft, No tenderness, No rebound - guarding or rigidity. No Cyanosis, Clubbing or edema, No new Rash or bruise  Data Reviewed: I have personally reviewed following labs and imaging studies  CBC: Recent Labs  Lab 11/29/22 1019 11/29/22 1037  WBC 9.8  --   NEUTROABS 8.1*  --   HGB 9.5* 10.2*  HCT 28.3* 30.0*  MCV 94.3  --   PLT 206  --     Basic Metabolic Panel: Recent Labs  Lab 11/29/22 1019 11/29/22 1037  NA 137 133*  K 4.9 4.7  CL 96*  --    CO2 26  --   GLUCOSE 92  --   BUN 42*  --   CREATININE 10.32*  --   CALCIUM 10.7*  --     GFR: Estimated Creatinine Clearance: 10.7 mL/min (A) (by C-G formula based on SCr of 10.32 mg/dL (H)).  Liver Function Tests: Recent Labs  Lab 11/29/22 1019  AST 14*  ALT 18  ALKPHOS 85  BILITOT 1.1  PROT 6.2*  ALBUMIN 2.4*    CBG: Recent Labs  Lab 11/29/22 1044  GLUCAP 75     Recent Results (from the past 240 hour(s))  Resp panel by RT-PCR (RSV, Flu A&B, Covid) Anterior Nasal Swab     Status: None   Collection Time: 11/29/22 10:27 AM   Specimen: Anterior Nasal Swab  Result Value Ref Range Status   SARS Coronavirus 2 by RT PCR NEGATIVE NEGATIVE Final   Influenza A by PCR NEGATIVE NEGATIVE Final   Influenza B by PCR NEGATIVE NEGATIVE Final    Comment: (NOTE) The Xpert Xpress SARS-CoV-2/FLU/RSV plus assay is intended as an aid in the diagnosis of influenza from Nasopharyngeal swab specimens and should not be used as a sole basis for treatment. Nasal washings and aspirates are unacceptable for Xpert Xpress SARS-CoV-2/FLU/RSV testing.  Fact Sheet for Patients: BloggerCourse.comhttps://www.fda.gov/media/152166/download  Fact Sheet for Healthcare Providers: SeriousBroker.ithttps://www.fda.gov/media/152162/download  This test is not yet approved or cleared by the [place] FDA and has been authorized for detection and/or diagnosis of SARS-CoV-2 by FDA under an Emergency Use Authorization (EUA). This EUA will remain in effect (meaning this test can be used) for the duration of the COVID-19 declaration under Section 564(b)(1) of the Act, 21 U.S.C. section 360bbb-3(b)(1), unless the authorization is terminated or revoked.     Resp Syncytial Virus by PCR NEGATIVE NEGATIVE Final    Comment: (NOTE) Fact Sheet for Patients: BloggerCourse.comhttps://www.fda.gov/media/152166/download  Fact Sheet for Healthcare Providers: SeriousBroker.ithttps://www.fda.gov/media/152162/download  This test is not yet approved or cleared by the [place] FDA and has been authorized for detection and/or diagnosis of SARS-CoV-2 by FDA under an Emergency Use Authorization (EUA). This EUA will remain in effect (meaning this test can be used) for the duration of the COVID-19 declaration under Section 564(b)(1) of the Act, 21 U.S.C. section 360bbb-3(b)(1), unless the authorization is terminated or revoked.  Performed at [facility], [address] [place], [place] [address]   MRSA Next Gen by PCR, Nasal     Status: None   Collection Time: 11/29/22 10:41 AM   Specimen: Nasal Mucosa; Nasal Swab  Result Value Ref Range Status   MRSA by PCR Next Gen NOT DETECTED NOT DETECTED Final    Comment: (NOTE) The GeneXpert MRSA Assay (FDA approved for NASAL specimens only), is one component of a comprehensive MRSA colonization surveillance program. It is not intended to diagnose MRSA infection nor to guide or monitor treatment for MRSA infections. Test performance is not FDA approved in patients less than 339 years old. Performed at [facility], [address] [place], [place] [address]          Radiology Studies: CT CHEST WO CONTRAST  Result Date: 11/29/2022 CLINICAL DATA:  Pneumonia, complication suspected, xray done. Lethargy, altered mental status EXAM: CT CHEST WITHOUT CONTRAST TECHNIQUE: Multidetector CT imaging of the chest was performed following the standard protocol without IV contrast. RADIATION DOSE REDUCTION: This exam was performed according to the departmental dose-optimization program which includes automated exposure control, adjustment of the mA and/or kV according to patient size and/or use of iterative reconstruction technique. COMPARISON:  Chest x-ray today.  Chest CT 07/04/2022 FINDINGS: Cardiovascular: Markedly enlarged central pulmonary arteries. Cardiomegaly with asymmetric enlargement of the left atrium, stable. Prior CABG. Aorta normal caliber with moderate calcifications. Mediastinum/Nodes: No  mediastinal, hilar, or axillary adenopathy. Trachea and esophagus are unremarkable. Thyroid unremarkable. Lungs/Pleura: Extensive bilateral lower lobe airspace opacities concerning for pneumonia. No visible effusions. Upper Abdomen: No acute findings Musculoskeletal: Chest wall soft tissues are unremarkable. No acute bony abnormality. IMPRESSION: Extensive bilateral lower lobe airspace consolidation concerning for pneumonia. Pulmonary artery enlargement compatible with pulmonary arterial hypertension, stable. Left atrial large enlargement, stable. Aortic Atherosclerosis (ICD10-I70.0). Electronically Signed   By: Charlett Nose M.D.   On: 11/29/2022 15:42   CT Head Wo Contrast  Result Date: 11/29/2022 CLINICAL DATA:  Mental status change. Lethargy. Dialysis patient. Heart transplant. EXAM: CT HEAD WITHOUT CONTRAST TECHNIQUE: Contiguous axial images were obtained from the base of the skull through the vertex without intravenous contrast. RADIATION DOSE REDUCTION: This exam was performed according to the departmental dose-optimization program which includes automated exposure control, adjustment of the mA and/or kV according to patient size and/or use of iterative reconstruction technique. COMPARISON:  07/04/2022 FINDINGS: Brain: Moderate low density in the periventricular white matter likely related to small vessel disease. Right frontal hypoattenuation, including on 21/3 is similar to the prior. Mild age advanced cerebral atrophy. No mass lesion, hemorrhage, hydrocephalus, acute infarct, intra-axial, or extra-axial fluid collection. Vascular: No hyperdense vessel or unexpected calcification. Skull: No significant soft tissue swelling.  No skull fracture. Sinuses/Orbits: Normal imaged portions of the orbits and globes. Clear paranasal sinuses and mastoid air cells. Other: None. IMPRESSION: 1.  No acute intracranial abnormality. 2. Similar appearance of right frontal hypoattenuation which is secondary to remote  infarct when compared to 11/09/2018 brain MR report. 3.  Cerebral atrophy and small vessel ischemic change. Electronically Signed   By: Jeronimo Greaves M.D.   On: 11/29/2022 13:50   DG Chest Port 1 View  Result Date: 11/29/2022 CLINICAL DATA:  Questionable sepsis EXAM: PORTABLE CHEST 1 VIEW COMPARISON:  X-ray 07/05/2022 FINDINGS: Postop chest with sternal wires. Enlarged cardiopericardial silhouette with vascular congestion. There is some lung base opacities identified which are similar to previous. Left retrocardiac greater than right. No pneumothorax or effusion. Film is rotated to the left. Overlapping cardiac leads. Right-sided rib fractures again seen. IMPRESSION: Underinflation with lung base parenchymal opacities. Atelectasis versus infiltrate. Postop chest with enlarged heart and central vascular congestion. Electronically Signed   By: Karen Kays M.D.   On: 11/29/2022 11:13        Scheduled Meds:  acetaminophen  650 mg Oral Q6H   apixaban  2.5 mg Oral BID   Chlorhexidine Gluconate Cloth  6 each Topical Q0600   cycloSPORINE modified  175 mg Oral Daily   divalproex  250 mg Oral Daily   divalproex  500 mg Oral QHS   DULoxetine  60 mg Oral Daily   guaiFENesin  1,200 mg Oral BID  ipratropium-albuterol  3 mL Nebulization Q6H   midodrine  10 mg Oral TID WC   mycophenolate  720 mg Oral BID   pregabalin  300 mg Oral Daily   rosuvastatin  10 mg Oral Daily   sevelamer carbonate  1,600 mg Oral TID WC   vancomycin variable dose per unstable renal function (pharmacist dosing)   Does not apply See admin instructions   Continuous Infusions:  ceFEPime (MAXIPIME) IV 1 g (11/30/22 0937)   vancomycin 500 mg (11/30/22 1006)     LOS: 1 day     Huey Bienenstock, MD [facility]   To contact the attending provider between 7A-7P or the covering provider during after hours 7P-7A, please log into the web site [email or web address] and access using universal [place] password for that web site.  If you do not have the password, please call the hospital operator.  11/30/2022, 10:06 AM

## 2022-11-30 NOTE — Progress Notes (Signed)
[facility] Progress Note   Subjective: Had dialysis overnight - UF. Seen in room, sleepy but responds appropriately. No complaints this am.   Objective Vitals:   11/30/22 0626 11/30/22 0639 11/30/22 0656 11/30/22 0900  BP: 93/64 101/66  115/71  Pulse: 80 97  78  Resp: 12 19  18   Temp:  98 F (36.7 C)  98.4 F (36.9 C)  TempSrc:  Oral  Oral  SpO2: 97% 100%    Weight:   116.9 kg   Height:        116.9 kg  Additional Objective Labs: Basic Metabolic Panel: Recent Labs  Lab 11/29/22 1019 11/29/22 1037  NA 137 133*  K 4.9 4.7  CL 96*  --   CO2 26  --   GLUCOSE 92  --   BUN 42*  --   CREATININE 10.32*  --   CALCIUM 10.7*  --    CBC: Recent Labs  Lab 11/29/22 1019 11/29/22 1037  WBC 9.8  --   NEUTROABS 8.1*  --   HGB 9.5* 10.2*  HCT 28.3* 30.0*  MCV 94.3  --   PLT 206  --    Blood Culture    Component Value Date/Time   SDES BLOOD SITE NOT SPECIFIED 11/29/2022 1830   SPECREQUEST  11/29/2022 1830    BOTTLES DRAWN AEROBIC AND ANAEROBIC Blood Culture adequate volume   CULT  11/29/2022 1830    NO GROWTH < 24 HOURS Performed at [facility], [address] [place], [place] [address]    REPTSTATUS PENDING 11/29/2022 1830     Physical Exam General: Lying in bed, drowsy Heart: RRR No m,r Lungs: Clear bilaterally Abdomen: soft, no masses  Extremities: 1 + LE edema Dialysis Access: R AVF +bruit   Medications:  ceFEPime (MAXIPIME) IV 1 g (11/30/22 0937)   vancomycin 500 mg (11/30/22 1006)    acetaminophen  650 mg Oral Q6H   apixaban  2.5 mg Oral BID   Chlorhexidine Gluconate Cloth  6 each Topical Q0600   [START ON 12/01/2022] cycloSPORINE modified  100 mg Oral q AM   And   [START ON 12/01/2022] cycloSPORINE modified  75 mg Oral QHS   divalproex  250 mg Oral Daily   divalproex  500 mg Oral QHS   DULoxetine  60 mg Oral Daily   guaiFENesin  1,200 mg Oral BID   ipratropium-albuterol  3 mL Nebulization Q6H   midodrine  10 mg Oral  TID WC   mycophenolate  720 mg Oral BID   pregabalin  300 mg Oral Daily   rosuvastatin  10 mg Oral Daily   sevelamer carbonate  1,600 mg Oral TID WC   vancomycin variable dose per unstable renal function (pharmacist dosing)   Does not apply See admin instructions    Dialysis Orders:  [facility]  4h  500/2.0   120kg   3K/2Ca bath  AVF   Hep none - last HD 4/01, post wt 120.3kg  - mircera 120mg  IV q 4 wks, last 3/29, due 4/26 - no vdra  Assessment/Plan: AHRF/ bilateral PNA by CT - w/ cough, rhonchi, no sig fevers / wbc here. Possibly aspirating.  Tremors/ dementia/ debility - pt has become WC bound w/ deterioriating neuro condition over the last 3-6 mos per family.  Follows w/ [facility].  ESRD - on HD MWF. HD completed early this am. Plan next HD Sat  HTN/ volume - mild vol excess on exam. CT w/o any  lung edema, and BP's soft. Only tolerated 0.5L with HD this am. UF as able.  Chronic hypotension - takes midodrine 10mg  tid at home, continue here.  Anemia esrd - Hb 9-11 here, follow.  MBD ckd - CCa is a little high, not on any vdra or Ca++ containing binders. Get phos.   H/o heart transplant - takes CyA and myfortic  H/o CVA H/o prostate cancer Atrial fib IDDM H/o charcot foot GOC - signficant decline per wife/ dtr over the last 6 mos. Has memory loss/ regular hallucinations, tremors, possible dysphagia, and physical decline now mostly WC bound. Encouraged wife to meet w/ palliative care, she is agreeable. Will consult PCT.    Tomasa Blase PA-C [facility] 11/30/2022,10:44 AM

## 2022-11-30 NOTE — Consult Note (Signed)
Consultation Note Date: 11/30/2022   Patient Name: [name]  DOB: [birth year]  MRN: [id number]  Age / Sex: 62 y.o., male  PCP: Merri Brunette, MD Referring Physician: Starleen Arms, MD  Reason for Consultation: Establishing goals of care  HPI/Patient Profile: 62 y.o. male  with past medical history of CHF status post heart transplant in 2014, on chronic immunosuppressant, ESRD on HD MWF, HTN, HLD, PAF on Eliquis, chronically on narcotics, anxiety/depression, recently diagnosed dementia  admitted on 11/29/2022 with lethargy and confusion, as well as generalized weakness.   Patient has been admitted for sepsis and acute hypoxic respiratory failure secondary to aspiration pneumonia.  PMT has been consulted to assist with goals of care conversation.  Clinical Assessment and Goals of Care:  I have reviewed medical records including EPIC notes, labs and imaging, assessed the patient and then met at the bedside with patient's wife and daughter to discuss diagnosis prognosis, GOC, EOL wishes, disposition and options.  I introduced Palliative Medicine as specialized medical care for people living with serious illness. It focuses on providing relief from the symptoms and stress of a serious illness. The goal is to improve quality of life for both the patient and the family.  We discussed a brief life review of the patient and then focused on their current illness.  The natural disease trajectory and expectations at EOL were discussed.  I attempted to elicit values and goals of care important to the patient.    Medical History Review and Understanding:  We discussed patient's longstanding chronic comorbidities and health journey over the past 14 years, including concerns about polypharmacy.  We also discussed his acute illness and management with IV antibiotics.  Patient's family have a good understanding the  severity of his illness, that they wish there was more information regarding his long-term prognosis.  Social History: Patient lives at home with his wife and also has assistance from his daughter.  They are reaching the point where they have been financially drained and using up the majority of time off of work that is possible.  He is retired from the city of [place].  He is a Curator.  Functional and Nutritional State: Patient has been physically declining for the past 6 months.  He has had very little appetite or energy for the past 1 month.  He was still driving to dialysis in October and now requires family to "drag" him there, as he is wheelchair dependent.  Palliative Symptoms: Patient denies symptoms at this time  Advance Directives: A detailed discussion regarding advanced directives was had.  Patient has previously completed documents, but I was unable to locate in EMR.  I have requested family to bring a copy.  Counseled on the option to revisit completion of a new advance directive during this admission if goals of care have changed since his prostate cancer.   Code Status: Concepts specific to code status, artifical feeding and hydration, and rehospitalization were considered and discussed. Recommended consideration of DNR status, understanding evidenced-based poor outcomes in similar hospitalized patients, as the cause of the arrest is likely associated with chronic/terminal disease rather than a reversible acute cardio-pulmonary event.  Patient states "of course" he would want CPR and mechanical ventilation, though his wife is very concerned that this would cause more suffering than benefit.  I shared that I am also concerned about this and recommended ongoing discussions.  Discussion: Patient was not able to participate as much in today's conversation, as he was receiving his  breathing treatment and still very fatigued.  He listened throughout our discussion and shared his  agreement with what his family shared.  Family's goal is for clear communication and a realistic picture of his prognosis and what to expect moving forward.  They are hopeful for PT/OT, as well as SLP therapies, and a clearer understanding of his potential for improvement or lack thereof.  They want to be able to make arrangements for his care needs, as they are having more more trouble taking care of him on their own.  We discussed the unfortunately limited options for caregiver support in the home.  Family is very hesitant about placement in a facility, understandably so.   Provided education on the effects of dementia on motor coordination such as mobility and coordination of swallowing.  Reviewed recent EEG done in the outpatient setting.  I shared my concern about the risk for recurrent hospitalizations and infections if his dementia continues to progress faster, reviewing the risk of these recurrent hospitalizations potentially leading to expedited progression of his dementia.  Discussed that he will need time for outcomes to give a better picture of where he is in his disease trajectory.  Family would like updates from the care team when there is important changes or lab results.   The difference between aggressive medical intervention and comfort care was considered in light of the patient's goals of care. Hospice and Palliative Care services outpatient were explained and offered.   Discussed the importance of continued conversation with family and the medical providers regarding overall plan of care and treatment options, ensuring decisions are within the context of the patient's values and GOCs.   Questions and concerns were addressed.  Hard Choices booklet left for review. The family was encouraged to call with questions or concerns.  PMT will continue to support holistically.   SUMMARY OF RECOMMENDATIONS   -Continue full code/full scope treatment -Requested a copy of advance directives from  patient's wife, discussing the option for completing a new notarized form during this admission as well -Patient's wife requests updates and clear communication about patient's prognosis and realistic expectations for improvement -Consulted dietitian for poor oral intake and recommendations, assistance is appreciated -Psychosocial and emotional support provided -Ongoing goals of care discussions pending clinical course -PMT will continue to follow and support   Prognosis:  Guarded  Discharge Planning: To Be Determined      Primary Diagnoses: Present on Admission:  Atrial fibrillation  Acute encephalopathy  PNA (pneumonia)  Physical Exam Vitals and nursing note reviewed.  Constitutional:      General: He is not in acute distress.    Appearance: He is ill-appearing.  Cardiovascular:     Rate and Rhythm: Normal rate.  Pulmonary:     Effort: No respiratory distress.     Comments: Receiving breathing treatment Neurological:     Mental Status: He is alert.  Psychiatric:        Behavior: Behavior normal.    Vital Signs: BP 102/76 (BP Location: Left Wrist)   Pulse 77   Temp 98.4 F (36.9 C) (Oral)   Resp 17   Ht 6\' 6"  (1.981 m)   Wt 116.9 kg   SpO2 100%   BMI 29.78 kg/m  Pain Scale: 0-10   Pain Score: 0-No pain   SpO2: SpO2: 100 % O2 Device:SpO2: 100 % O2 Flow Rate: .O2 Flow Rate (L/min): 2 L/min   Palliative Assessment/Data: TBD      MDM: high    Sriram Febles  Jeni Salles, PA-C  Palliative Medicine Team Team phone # [phone]  Thank you for allowing the Palliative Medicine Team to assist in the care of this patient. Please utilize secure chat with additional questions, if there is no response within 30 minutes please call the above phone number.  Palliative Medicine Team providers are available by phone from 7am to 7pm daily and can be reached through the team cell phone.  Should this patient require assistance outside of these hours, please call the  patient's attending physician.

## 2022-11-30 NOTE — Progress Notes (Signed)
Received patient in bed to unit.  Alert and oriented.  Informed consent signed and in chart.   TX duration: 3.30  Patient tolerated well.  Transported back to the room  Alert, without acute distress.  Hand-off given to patient's nurse.   Access used: fistula Access issues: none  Total UF removed: 500 ls Medication(s) given: none Post HD VS: 101/66 Post HD weight: 116.9     11/30/22 0639  Vitals  Temp 98 F (36.7 C)  Temp Source Oral  BP 101/66  MAP (mmHg) 78  BP Location Right Arm  BP Method Automatic  Patient Position (if appropriate) Lying  Pulse Rate 97  Pulse Rate Source Monitor  ECG Heart Rate 98  Resp 19  Oxygen Therapy  SpO2 100 %  O2 Device Room Air  O2 Flow Rate (L/min) 2 L/min  Patient Activity (if Appropriate) In bed  Pulse Oximetry Type Continuous  Post Treatment  Dialyzer Clearance Lightly streaked  Duration of HD Treatment -hour(s) 3.5 hour(s)  Hemodialysis Intake (mL) 0 mL  Liters Processed 83.9  Fluid Removed (mL) 500 mL  Tolerated HD Treatment Yes  Post-Hemodialysis Comments HD tx completed as expected witout complications, tolerated well, no complaints. pt is stable.  AVG/AVF Arterial Site Held (minutes) 10 minutes  AVG/AVF Venous Site Held (minutes) 10 minutes  Note  Observations pt is in bed resting, alert, oriented, verbally responsive, no complaints.  Fistula / Graft Left Forearm  No Placement Date or Time found.   Orientation: Left  Access Location: Forearm  Site Condition No complications  Fistula / Graft Assessment Present;Thrill;Bruit  Status Deaccessed  Drainage Description None

## 2022-11-30 NOTE — Progress Notes (Signed)
PT Cancellation Note  Patient Details Name: [name] MRN: [id number] DOB: 05-04-1961   Cancelled Treatment:    Reason Eval/Treat Not Completed: Patient at procedure or test/unavailable. Pt currently off unit for HD. Will check back as schedule allows to initiate PT evaluation.    Marylynn Pearson 11/30/2022, 8:17 AM  Conni Slipper, PT, DPT Acute Rehabilitation Services Secure Chat Preferred Office: [phone]

## 2022-12-01 DIAGNOSIS — G9340 Encephalopathy, unspecified: Secondary | ICD-10-CM | POA: Diagnosis not present

## 2022-12-01 DIAGNOSIS — J189 Pneumonia, unspecified organism: Secondary | ICD-10-CM | POA: Diagnosis not present

## 2022-12-01 DIAGNOSIS — J9601 Acute respiratory failure with hypoxia: Secondary | ICD-10-CM | POA: Diagnosis not present

## 2022-12-01 LAB — RENAL FUNCTION PANEL
Albumin: 2.1 g/dL — ABNORMAL LOW (ref 3.5–5.0)
Anion gap: 14 (ref 5–15)
BUN: 39 mg/dL — ABNORMAL HIGH (ref 8–23)
CO2: 26 mmol/L (ref 22–32)
Calcium: 10.3 mg/dL (ref 8.9–10.3)
Chloride: 93 mmol/L — ABNORMAL LOW (ref 98–111)
Creatinine, Ser: 7.83 mg/dL — ABNORMAL HIGH (ref 0.61–1.24)
GFR, Estimated: 7 mL/min — ABNORMAL LOW (ref >60)
Glucose, Bld: 78 mg/dL (ref 70–99)
Phosphorus: 3.5 mg/dL (ref 2.5–4.6)
Potassium: 4 mmol/L (ref 3.5–5.1)
Sodium: 133 mmol/L — ABNORMAL LOW (ref 135–145)

## 2022-12-01 LAB — CBC
HCT: 28.4 % — ABNORMAL LOW (ref 39.0–52.0)
Hemoglobin: 9.4 g/dL — ABNORMAL LOW (ref 13.0–17.0)
MCH: 31.1 pg (ref 26.0–34.0)
MCHC: 33.1 g/dL (ref 30.0–36.0)
MCV: 94 fL (ref 80.0–100.0)
Platelets: 192 10*3/uL (ref 150–400)
RBC: 3.02 MIL/uL — ABNORMAL LOW (ref 4.22–5.81)
RDW: 16.7 % — ABNORMAL HIGH (ref 11.5–15.5)
WBC: 9.7 10*3/uL (ref 4.0–10.5)
nRBC: 0 % (ref 0.0–0.2)

## 2022-12-01 MED ORDER — LIDOCAINE HCL (PF) 1 % IJ SOLN: 5.0000 mL | INTRAMUSCULAR | Status: DC | PRN

## 2022-12-01 MED ORDER — PENTAFLUOROPROP-TETRAFLUOROETH EX AERO: 1.0000 | INHALATION_SPRAY | CUTANEOUS | Status: DC | PRN

## 2022-12-01 MED ORDER — VANCOMYCIN HCL 750 MG/150ML IV SOLN
750.0000 mg | Freq: Once | INTRAVENOUS | Status: AC
  Administered 2022-12-01: 750 mg via INTRAVENOUS
  Filled 2022-12-01: qty 150

## 2022-12-01 MED ORDER — LIDOCAINE-PRILOCAINE 2.5-2.5 % EX CREA: 1.0000 | TOPICAL_CREAM | CUTANEOUS | Status: DC | PRN

## 2022-12-01 MED ORDER — PREGABALIN 75 MG PO CAPS
75.0000 mg | ORAL_CAPSULE | Freq: Every day | ORAL | Status: DC
  Administered 2022-12-02: 75 mg via ORAL
  Filled 2022-12-01 (×2): qty 3

## 2022-12-01 MED ORDER — PREGABALIN 75 MG PO CAPS: 150.0000 mg | ORAL_CAPSULE | Freq: Every day | ORAL | Status: DC

## 2022-12-01 NOTE — Progress Notes (Addendum)
PROGRESS NOTE    [name]  QQI:[id number] DOB: 07-17-1961 DOA: 11/29/2022 PCP: Merri Brunette, MD   Chief Complaint  Patient presents with   Shortness of Breath    Brief Narrative:   [name] is a 62 y.o. male with medical history significant of CHF status post heart transplant in 2014, on chronic immunosuppressant, ESRD on HD MWF, HTN, HLD, PAF on Eliquis, obesity, chronic on narcotics, anxiety/depression, recently diagnosed dementia, brought in by family member for evaluation of worsening of generalized weakness lethargy and confusion.   in ED, patient was found to be hypoxic stabilized on 7 L via high flow oxygen.  Blood pressure borderline low but according to patient's family his blood pressure SBP in the range of 80-90s.  Borderline tachycardia, afebrile.  Chest x-ray showed right lower lobe pneumonia.  Blood work showed WBC 9.8, creatinine 10.3 BUN 42, bicarb 26, K4.9.  Patient was started on cefepime and vancomycin   Assessment & Plan:   Principal Problem:   PNA (pneumonia) Active Problems:   Atrial fibrillation   Acute encephalopathy   AMS (altered mental status)   Aspiration pneumonia   Acute hypoxic respiratory failure -Secondary to aspiration pneumonia, likely from vomiting episode of vomiting on Sunday. -Aspiration precaution -Breathing treatment, DuoNeb every 6 hours, incentive spirometry and flutter valve. -Encouraged to use incentive spirometry and flutter valve.   Sepsis secondary to multifocal pneumonia Sepsis present on admission -Evidenced by tachypneic, and hypoxic with symptoms signs of endorgan damage of acute encephalopathy, and lactic acid of 2.8 -Given he is immunocompromise, continue with broad-spectrum antibiotic IV vancomycin and cefepime. -CT chest significant for extensive bibasilar opacities  Acute metabolic encephalopathy -Secondary to sepsis, management as above. -Will decrease Lyrica from 300 mg to 75 mg given he is ESRD patient     History if heart transplant -Last seen by [facility] heart failure and transplant team in November 2023 and was considered to be stable.  Continue current immune suppressant regimen including cyclosporine and mycophenolate   ESRD on HD MWF -Consulted, dialyzed overnight, he is on Monday Wednesday Friday schedule   IDDM -Sliding scale for now   PAF -Sinus rhythm, continue Eliquis   Hypotension -Family reported blood pressure appears to be at baseline, continue home regimen of midodrine   Hx of stroke -Stable, continue Eliquis      DVT prophylaxis: Eliquis Code Status: Full Family Communication: None at bedside, discussed with wife by phone Disposition:   Status is: Inpatient    Consultants:  Renal Palliative  Subjective:  No significant events overnight as discussed with staff, patient denies any complaints today  Objective: Vitals:   12/01/22 1001 12/01/22 1030 12/01/22 1104 12/01/22 1133  BP: 111/70 136/85 113/71 124/82  Pulse: 81 88 77 80  Resp: (!) 24 16 (!) 7 (!) 21  Temp:      TempSrc:      SpO2: 96% 100% 99% 97%  Weight:      Height:        Intake/Output Summary (Last 24 hours) at 12/01/2022 1150 Last data filed at 11/30/2022 1810 Gross per 24 hour  Intake 100 ml  Output --  Net 100 ml   Filed Weights   11/30/22 0239 11/30/22 0656 12/01/22 0816  Weight: 117.4 kg 116.9 kg 120.9 kg    Examination:  Patient is more awake today, answering some questions appropriately, appears to be extremely frail, chronically ill-appearing, and deconditioned.. symmetrical Chest wall movement, bibasilar rales and rhonchi RRR,No Gallops,Rubs or new  Murmurs, No Parasternal Heave +ve B.Sounds, Abd Soft, No tenderness, No rebound - guarding or rigidity. No Cyanosis, Clubbing or edema, No new Rash or bruise       Data Reviewed: I have personally reviewed following labs and imaging studies  CBC: Recent Labs  Lab 11/29/22 1019 11/29/22 1037 11/30/22 1423  12/01/22 0304  WBC 9.8  --  9.2 9.7  NEUTROABS 8.1*  --   --   --   HGB 9.5* 10.2* 8.1* 9.4*  HCT 28.3* 30.0* 24.8* 28.4*  MCV 94.3  --  93.9 94.0  PLT 206  --  195 192    Basic Metabolic Panel: Recent Labs  Lab 11/29/22 1019 11/29/22 1037 11/30/22 1423 12/01/22 0304  NA 137 133* 133* 133*  K 4.9 4.7 4.0 4.0  CL 96*  --  97* 93*  CO2 26  --  25 26  GLUCOSE 92  --  79 78  BUN 42*  --  32* 39*  CREATININE 10.32*  --  7.14* 7.83*  CALCIUM 10.7*  --  9.6 10.3  PHOS  --   --   --  3.5    GFR: Estimated Creatinine Clearance: 14.3 mL/min (A) (by C-G formula based on SCr of 7.83 mg/dL (H)).  Liver Function Tests: Recent Labs  Lab 11/29/22 1019 12/01/22 0304  AST 14*  --   ALT 18  --   ALKPHOS 85  --   BILITOT 1.1  --   PROT 6.2*  --   ALBUMIN 2.4* 2.1*    CBG: Recent Labs  Lab 11/29/22 1044  GLUCAP 75     Recent Results (from the past 240 hour(s))  Resp panel by RT-PCR (RSV, Flu A&B, Covid) Anterior Nasal Swab     Status: None   Collection Time: 11/29/22 10:27 AM   Specimen: Anterior Nasal Swab  Result Value Ref Range Status   SARS Coronavirus 2 by RT PCR NEGATIVE NEGATIVE Final   Influenza A by PCR NEGATIVE NEGATIVE Final   Influenza B by PCR NEGATIVE NEGATIVE Final    Comment: (NOTE) The Xpert Xpress SARS-CoV-2/FLU/RSV plus assay is intended as an aid in the diagnosis of influenza from Nasopharyngeal swab specimens and should not be used as a sole basis for treatment. Nasal washings and aspirates are unacceptable for Xpert Xpress SARS-CoV-2/FLU/RSV testing.  Fact Sheet for Patients: BloggerCourse.com  Fact Sheet for Healthcare Providers: SeriousBroker.it  This test is not yet approved or cleared by the [place] FDA and has been authorized for detection and/or diagnosis of SARS-CoV-2 by FDA under an Emergency Use Authorization (EUA). This EUA will remain in effect (meaning this test can be used)  for the duration of the COVID-19 declaration under Section 564(b)(1) of the Act, 21 U.S.C. section 360bbb-3(b)(1), unless the authorization is terminated or revoked.     Resp Syncytial Virus by PCR NEGATIVE NEGATIVE Final    Comment: (NOTE) Fact Sheet for Patients: BloggerCourse.com  Fact Sheet for Healthcare Providers: SeriousBroker.it  This test is not yet approved or cleared by the [place] FDA and has been authorized for detection and/or diagnosis of SARS-CoV-2 by FDA under an Emergency Use Authorization (EUA). This EUA will remain in effect (meaning this test can be used) for the duration of the COVID-19 declaration under Section 564(b)(1) of the Act, 21 U.S.C. section 360bbb-3(b)(1), unless the authorization is terminated or revoked.  Performed at [facility], [address], [place], [place] [address]   Blood Culture (routine x 2)  Status: None (Preliminary result)   Collection Time: 11/29/22 10:27 AM   Specimen: BLOOD LEFT ARM  Result Value Ref Range Status   Specimen Description BLOOD LEFT ARM  Final   Special Requests   Final    BOTTLES DRAWN AEROBIC AND ANAEROBIC Blood Culture results may not be optimal due to an excessive volume of blood received in culture bottles   Culture   Final    NO GROWTH 2 DAYS Performed at [facility], [address], [place], [place] [address]    Report Status PENDING  Incomplete  MRSA Next Gen by PCR, Nasal     Status: None   Collection Time: 11/29/22 10:41 AM   Specimen: Nasal Mucosa; Nasal Swab  Result Value Ref Range Status   MRSA by PCR Next Gen NOT DETECTED NOT DETECTED Final    Comment: (NOTE) The GeneXpert MRSA Assay (FDA approved for NASAL specimens only), is one component of a comprehensive MRSA colonization surveillance program. It is not intended to diagnose MRSA infection nor to guide or monitor treatment for MRSA infections. Test performance is not  FDA approved in patients less than 73 years old. Performed at [facility], [address] [place], [place] [address]   Blood Culture (routine x 2)     Status: None (Preliminary result)   Collection Time: 11/29/22  6:30 PM   Specimen: BLOOD  Result Value Ref Range Status   Specimen Description BLOOD SITE NOT SPECIFIED  Final   Special Requests   Final    BOTTLES DRAWN AEROBIC AND ANAEROBIC Blood Culture adequate volume   Culture   Final    NO GROWTH 2 DAYS Performed at [facility], [address], [place], [place] [address]    Report Status PENDING  Incomplete         Radiology Studies: CT CHEST WO CONTRAST  Result Date: 11/29/2022 CLINICAL DATA:  Pneumonia, complication suspected, xray done. Lethargy, altered mental status EXAM: CT CHEST WITHOUT CONTRAST TECHNIQUE: Multidetector CT imaging of the chest was performed following the standard protocol without IV contrast. RADIATION DOSE REDUCTION: This exam was performed according to the departmental dose-optimization program which includes automated exposure control, adjustment of the mA and/or kV according to patient size and/or use of iterative reconstruction technique. COMPARISON:  Chest x-ray today.  Chest CT 07/04/2022 FINDINGS: Cardiovascular: Markedly enlarged central pulmonary arteries. Cardiomegaly with asymmetric enlargement of the left atrium, stable. Prior CABG. Aorta normal caliber with moderate calcifications. Mediastinum/Nodes: No mediastinal, hilar, or axillary adenopathy. Trachea and esophagus are unremarkable. Thyroid unremarkable. Lungs/Pleura: Extensive bilateral lower lobe airspace opacities concerning for pneumonia. No visible effusions. Upper Abdomen: No acute findings Musculoskeletal: Chest wall soft tissues are unremarkable. No acute bony abnormality. IMPRESSION: Extensive bilateral lower lobe airspace consolidation concerning for pneumonia. Pulmonary artery enlargement compatible with pulmonary arterial  hypertension, stable. Left atrial large enlargement, stable. Aortic Atherosclerosis (ICD10-I70.0). Electronically Signed   By: Charlett Nose M.D.   On: 11/29/2022 15:42   CT Head Wo Contrast  Result Date: 11/29/2022 CLINICAL DATA:  Mental status change. Lethargy. Dialysis patient. Heart transplant. EXAM: CT HEAD WITHOUT CONTRAST TECHNIQUE: Contiguous axial images were obtained from the base of the skull through the vertex without intravenous contrast. RADIATION DOSE REDUCTION: This exam was performed according to the departmental dose-optimization program which includes automated exposure control, adjustment of the mA and/or kV according to patient size and/or use of iterative reconstruction technique. COMPARISON:  07/04/2022 FINDINGS: Brain: Moderate low density in the  periventricular white matter likely related to small vessel disease. Right frontal hypoattenuation, including on 21/3 is similar to the prior. Mild age advanced cerebral atrophy. No mass lesion, hemorrhage, hydrocephalus, acute infarct, intra-axial, or extra-axial fluid collection. Vascular: No hyperdense vessel or unexpected calcification. Skull: No significant soft tissue swelling.  No skull fracture. Sinuses/Orbits: Normal imaged portions of the orbits and globes. Clear paranasal sinuses and mastoid air cells. Other: None. IMPRESSION: 1.  No acute intracranial abnormality. 2. Similar appearance of right frontal hypoattenuation which is secondary to remote infarct when compared to 11/09/2018 brain MR report. 3.  Cerebral atrophy and small vessel ischemic change. Electronically Signed   By: Jeronimo GreavesKyle  Talbot M.D.   On: 11/29/2022 13:50        Scheduled Meds:  acetaminophen  650 mg Oral Q6H   apixaban  2.5 mg Oral BID   Chlorhexidine Gluconate Cloth  6 each Topical Q0600   cycloSPORINE modified  100 mg Oral q AM   And   cycloSPORINE modified  75 mg Oral QHS   divalproex  250 mg Oral Daily   divalproex  500 mg Oral QHS   DULoxetine  60 mg  Oral Daily   guaiFENesin  1,200 mg Oral BID   ipratropium-albuterol  3 mL Nebulization Q6H   midodrine  10 mg Oral TID WC   mycophenolate  720 mg Oral BID   pregabalin  300 mg Oral Daily   rosuvastatin  10 mg Oral Daily   sevelamer carbonate  1,600 mg Oral TID WC   vancomycin variable dose per unstable renal function (pharmacist dosing)   Does not apply See admin instructions   Continuous Infusions:  ceFEPime (MAXIPIME) IV Stopped (11/30/22 1007)   vancomycin       LOS: 2 days     Huey Bienenstockawood Agnes Probert, MD [facility]   To contact the attending provider between 7A-7P or the covering provider during after hours 7P-7A, please log into the web site [email or web address] and access using universal [place] password for that web site. If you do not have the password, please call the hospital operator.  12/01/2022, 11:50 AM

## 2022-12-01 NOTE — Evaluation (Signed)
Physical Therapy Evaluation Patient Details Name: [name] MRN: [id number] DOB: 1961/03/26 Today's Date: 12/01/2022  History of Present Illness  62 y/o M admitted to [facility] on 4/4 for SOB and feeling tired with lethargy and confusion. Chest x-ray showing right lower lobe pneumonia, head CT without acute intracranial abnormality. PMHx: CHF s/p heart transplant in 2014, chronic immunosuppressant, ESRD on HD MWF, HTN, HLD, PAF on Eliquis, anxiety/depression, dementia.  Clinical Impression  Pt presents today with impaired functional mobility, limited by decreased strength, balance, activity tolerance, and cognition. Pt reports at recent baseline he performs bed mobility and stand-pivot transfers to his wheelchair without assist, utilizing assist for wheelchair propulsion, and attending OPPT. Today, pt required min-modA for transfer to chair with increased time and cueing. Pt reports he has full time care at home and his family is able to assist, requesting to return home and continue OPPT at discharge. Discussed other options for further therapy but pt reporting he would like to continue. Acute PT will follow up with pt during admission to progress mobility, recommend return home with OPPT if family is able to care for him at his current level. If family unable to care for pt/provide transportation to OPPT, recommend subacute PT in a facility setting vs HHPT pending support.        Recommendations for follow up therapy are one component of a multi-disciplinary discharge planning process, led by the attending physician.  Recommendations may be updated based on patient status, additional functional criteria and insurance authorization.  Follow Up Recommendations       Assistance Recommended at Discharge Frequent or constant Supervision/Assistance  Patient can return home with the following  A lot of help with walking and/or transfers;Assist for transportation    Equipment Recommendations None  recommended by PT  Recommendations for Other Services       Functional Status Assessment Patient has had a recent decline in their functional status and demonstrates the ability to make significant improvements in function in a reasonable and predictable amount of time.     Precautions / Restrictions Precautions Precautions: Fall;Other (comment) Precaution Comments: watch O2 Restrictions Weight Bearing Restrictions: No      Mobility  Bed Mobility Overal bed mobility: Needs Assistance Bed Mobility: Supine to Sit     Supine to sit: Min guard, HOB elevated     General bed mobility comments: increased time and use of bedrail, cueing for scooting to EOB with close guard for safety and a few attempts needed to pivot hips    Transfers Overall transfer level: Needs assistance Equipment used: 1 person hand held assist Transfers: Sit to/from Stand, Bed to chair/wheelchair/BSC Sit to Stand: Min assist, From elevated surface Stand pivot transfers: Mod assist, From elevated surface         General transfer comment: minA for power up and steady with 1HHA provided, modA for pivoting for safety and slowing descent into chair    Ambulation/Gait               General Gait Details: deferred as pt has been utilizing w/c recently  Stairs            Wheelchair Mobility    Modified Rankin (Stroke Patients Only)       Balance Overall balance assessment: Needs assistance Sitting-balance support: Bilateral upper extremity supported, Feet supported Sitting balance-Leahy Scale: Fair     Standing balance support: Single extremity supported, During functional activity Standing balance-Leahy Scale: Poor Standing balance comment: reliant on UE support  and assist for pivoting, standing statically with 1UE support                             Pertinent Vitals/Pain Pain Assessment Pain Assessment: Faces Faces Pain Scale: No hurt    Home Living Family/patient  expects to be discharged to:: Private residence Living Arrangements: Spouse/significant other;Children Available Help at Discharge: Family;Available 24 hours/day Type of Home: House         Home Layout: One level Home Equipment: Agricultural consultantolling Walker (2 wheels);Shower seat;Wheelchair - manual Additional Comments: family assists full time    Prior Function Prior Level of Function : Needs assist             Mobility Comments: pt reports performing bed mobility and transfers independently to wheelchair with stand-pivot method. Assist needed for wheelchair propulsion. Per chart review and discussion with pt, he was receiving OPPT with family providing transportation       Hand Dominance   Dominant Hand: Right    Extremity/Trunk Assessment   Upper Extremity Assessment Upper Extremity Assessment: Defer to OT evaluation    Lower Extremity Assessment Lower Extremity Assessment: Generalized weakness (pt reports numbness in B feet, generalized weakness in BLE)    Cervical / Trunk Assessment Cervical / Trunk Assessment: Kyphotic  Communication   Communication: No difficulties (able to express needs but increased time needed)  Cognition Arousal/Alertness: Awake/alert, Lethargic Behavior During Therapy: Flat affect Overall Cognitive Status: History of cognitive impairments - at baseline                                 General Comments: Pt sleeping upon arrival but easily awoken, awake but becoming drowsy during session. Pt oriented to self and place but not time, note recent dementia diagnosis with family reporting sundowning in chart review. Pt with increased time for command following and processing, requiring cueing throughout        General Comments General comments (skin integrity, edema, etc.): Pt maintaining SPO2 in the 90s on room air with mobility, noted productive cough while seated EOB. No family at bedside today    Exercises     Assessment/Plan    PT  Assessment Patient needs continued PT services  PT Problem List Decreased strength;Decreased activity tolerance;Decreased balance;Decreased mobility;Decreased coordination;Decreased cognition;Decreased knowledge of use of DME;Decreased safety awareness;Cardiopulmonary status limiting activity;Impaired sensation       PT Treatment Interventions DME instruction;Functional mobility training;Therapeutic activities;Therapeutic exercise;Balance training;Neuromuscular re-education;Patient/family education;Wheelchair mobility training    PT Goals (Current goals can be found in the Care Plan section)  Acute Rehab PT Goals Patient Stated Goal: go home and continue OPPT PT Goal Formulation: With patient Time For Goal Achievement: 12/15/22 Potential to Achieve Goals: Fair    Frequency Min 3X/week     Co-evaluation               AM-PAC PT "6 Clicks" Mobility  Outcome Measure Help needed turning from your back to your side while in a flat bed without using bedrails?: A Little Help needed moving from lying on your back to sitting on the side of a flat bed without using bedrails?: A Little Help needed moving to and from a bed to a chair (including a wheelchair)?: A Lot Help needed standing up from a chair using your arms (e.g., wheelchair or bedside chair)?: A Little Help needed to walk in hospital room?: Total Help  needed climbing 3-5 steps with a railing? : Total 6 Click Score: 13    End of Session Equipment Utilized During Treatment: Gait belt Activity Tolerance: Patient tolerated treatment well;Patient limited by fatigue Patient left: in chair;with call bell/phone within reach;with chair alarm set;with nursing/sitter in room Nurse Communication: Mobility status PT Visit Diagnosis: Muscle weakness (generalized) (M62.81);Other abnormalities of gait and mobility (R26.89);Unsteadiness on feet (R26.81)    Time: 1424-1450 PT Time Calculation (min) (ACUTE ONLY): 26 min   Charges:   PT  Evaluation $PT Eval Moderate Complexity: 1 Mod          Lindalou Hose, PT DPT Acute Rehabilitation Services Office [phone]   Leonie Man 12/01/2022, 4:17 PM

## 2022-12-01 NOTE — Progress Notes (Signed)
PT Cancellation Note  Patient Details Name: NASSIM KULBACKI MRN: [id number] DOB: 1961/04/28   Cancelled Treatment:    Reason Eval/Treat Not Completed: Patient at procedure or test/unavailable. Pt off unit at dialysis at this time. PT will follow up for evaluation as appropriate/available. Thank you.    Leonie Man 12/01/2022, 9:39 AM

## 2022-12-01 NOTE — Progress Notes (Addendum)
Received patient in bed.Awake,alert and oriented x 2.    Access used: Right upper arm AVF that worked well. Medicine given :Midodrine 10 mg.  Duration of treatment 3.75 hour.  Fluid removed ; 2 liters.  Hemodialysis issue/comment: Tolerated treatment well.  Hand off to the patient's nurse.

## 2022-12-01 NOTE — Evaluation (Signed)
Speech Language Pathology Evaluation Patient Details Name: [name] MRN: [id number] DOB: Dec 29, 1960 Today's Date: 12/01/2022 Time: 3094-0768 SLP Time Calculation (min) (ACUTE ONLY): 15 min  Problem List:  Patient Active Problem List   Diagnosis Date Noted   AMS (altered mental status) 11/29/2022   Aspiration pneumonia 11/29/2022   PNA (pneumonia) 11/29/2022   Peripheral autonomic neuropathy due to DM 11/01/2022   Orthostatic hypotension 11/01/2022   Acute encephalopathy 07/04/2022   Chronic kidney disease, stage 5 11/14/2021   Gait abnormality 10/12/2021   Polyneuropathy associated with underlying disease 10/12/2021   Neuropathic pain 10/12/2021   Morbid obesity 11/19/2019   Sleep disorder 10/22/2019   Sleep disturbance 10/22/2019   Abnormality of gait 06/09/2019   Diabetic polyneuropathy associated with diabetes mellitus due to underlying condition 04/14/2019   Peripheral neuropathy 12/11/2018   CVA (cerebral vascular accident) 11/09/2018   H/O heart transplant 11/09/2018   Malignant neoplasm of prostate 05/06/2018   Closed posterior dislocation of hip, right, initial encounter 08/24/2017   End stage renal disease on dialysis 08/24/2017   Hip dislocation, right 08/24/2017   Osteoarthritis of right hip 07/22/2017   VENTRICULAR TACHYCARDIA 12/21/2009   Dyslipidemia 08/30/2009   Essential hypertension 08/30/2009   ISCHEMIC CARDIOMYOPATHY 08/30/2009   Atrial fibrillation 08/30/2009   VENTRICULAR ECTOPY 08/30/2009   History of CVA (cerebrovascular accident) 08/30/2009   Obesity, Class III, BMI 40-49.9 (morbid obesity) 08/30/2009   DM type 2 with diabetic peripheral neuropathy 10/17/2007   OBSTRUCTIVE SLEEP APNEA 10/17/2007   CARDIOMYOPATHY, DILATED 10/17/2007   Past Medical History:  Past Medical History:  Diagnosis Date   Arthritis    Atrial fibrillation (HCC)    CHF (congestive heart failure), NYHA class III (HCC)    s/p heart transplant   Chronic kidney disease     end stage   Chronic systolic dysfunction of left ventricle    CVA (cerebral infarction)    Diabetes mellitus    PMH; Prior to heart transplant   Family history of coronary artery disease    in both parents   GERD (gastroesophageal reflux disease)    Hyperlipidemia    Hypertension    Left bundle branch block    Morbid obesity (HCC)    status post lap band   Myocardial infarction [facility])    prior to heart transplant   Nonischemic cardiomyopathy (HCC)    prior to heart transplant   Obesity (BMI 30-39.9)    Obstructive sleep apnea    no longer needs CPAP after heart transplant per pt   Premature ventricular contractions    Prostate cancer (HCC)    SOB (shortness of breath)    Past Surgical History:  Past Surgical History:  Procedure Laterality Date   CARDIAC PACEMAKER PLACEMENT  09/21/2009   Biventricular implantable cardioverter-defibrillator implantation      COLONOSCOPY W/ BIOPSIES AND POLYPECTOMY     CYSTOSCOPY WITH FULGERATION N/A 04/27/2021   Procedure: CYSTOSCOPY AND CLOT EVACUATION WITH BLADDER BIOPSY/ FUGARATION OF BLADDER/ FULGARATION OF PROSTATE ;  Surgeon: Jerilee Field, MD;  Location: [facility];  Service: Urology;  Laterality: N/A;   FOOT SURGERY     left   HEART TRANSPLANT  2014   LAPAROSCOPIC GASTRIC BANDING  01/27/2007   LEFT VENTRICULAR ASSIST DEVICE     implanted at [facility]   PACEMAKER REMOVAL     PROSTATE BIOPSY     TOTAL HIP ARTHROPLASTY Right 07/22/2017   Procedure: RIGHT TOTAL HIP ARTHROPLASTY ANTERIOR APPROACH;  Surgeon: Samson Frederic, MD;  Location: [facility]  OR;  Service: Orthopedics;  Laterality: Right;  Needs RNFA   TOTAL KNEE ARTHROPLASTY Right 07/22/2017   HPI:  Patient is a 62 y.o. male with PMH: CHF s/p heart transplant in 2014, chronic immunosuppressant, ESRD on HD MWF, HTN, HLD, PAF on Eliquis, anxiety/depression, dementia. He presented to the hospital on 11/29/2022 with symptoms of SOB, feeling tired with lethargy and confusion. CXR showed right lower  lobe PNA; CT head was negative for acute intracranial abnormality.   Assessment / Plan / Recommendation Clinical Impression  Patient is presenting with moderate impairment of cognition, however no family present to confirm his baseline cognitive functioning. He was diagnosed with dementia one month ago. Of note, patient had HD earlier today and had PT evaluation session shortly before this speech-language cognitive evaluation. He required verbal cues to initiate tasks such as self-feeding. He would respond with one-word answers to questions and would not initiate to comment or request. Some perseveration on previous thought observed, such as when SLP asked him how long he has been at the hospital and he replied "two days" but when asked why he was here (even when SLP rephrased question) he repeated "two days".  He was oriented to self, year and place but not situation, day of week, month (stated "Spring").  SLP plans to continue assessing his cognition while admitted as well as try to contact family to determine baseline cognition.    SLP Assessment  SLP Recommendation/Assessment: Patient needs continued Speech Lanaguage Pathology Services SLP Visit Diagnosis: Cognitive communication deficit (R41.841)    Recommendations for follow up therapy are one component of a multi-disciplinary discharge planning process, led by the attending physician.  Recommendations may be updated based on patient status, additional functional criteria and insurance authorization.    Follow Up Recommendations  Skilled nursing-short term rehab (<3 hours/day)    Assistance Recommended at Discharge  Frequent or constant Supervision/Assistance  Functional Status Assessment Patient has had a recent decline in their functional status and demonstrates the ability to make significant improvements in function in a reasonable and predictable amount of time.  Frequency and Duration min 1 x/week  2 weeks      SLP  Evaluation Cognition  Overall Cognitive Status: No family/caregiver present to determine baseline cognitive functioning Arousal/Alertness: Awake/alert Orientation Level: Oriented to person;Oriented to place;Disoriented to time;Disoriented to situation Year: 2024 Month: January Day of Week: Incorrect Attention: Sustained Sustained Attention: Impaired Sustained Attention Impairment: Verbal basic;Functional basic Memory: Impaired Memory Impairment: Decreased recall of new information Awareness: Impaired Awareness Impairment: Intellectual impairment Problem Solving: Impaired Problem Solving Impairment: Functional basic;Verbal basic Executive Function: Initiating Initiating: Impaired Initiating Impairment: Functional basic;Verbal basic       Comprehension  Auditory Comprehension Overall Auditory Comprehension: Impaired Yes/No Questions: Impaired Basic Biographical Questions: 26-50% accurate EffectiveTechniques: Extra processing time;Stressing words;Repetition Visual Recognition/Discrimination Discrimination: Not tested Reading Comprehension Reading Status: Not tested    Expression Expression Primary Mode of Expression: Other (comment) (limited assessment) Written Expression Dominant Hand: Right   Oral / Motor  Oral Motor/Sensory Function Overall Oral Motor/Sensory Function: Within functional limits Motor Speech Overall Motor Speech: Appears within functional limits for tasks assessed Respiration: Within functional limits Resonance: Within functional limits Articulation: Within functional limitis Intelligibility: Intelligible            Angela Nevin, MA, CCC-SLP Speech Therapy

## 2022-12-01 NOTE — Progress Notes (Signed)
Daily Progress Note   Patient Name: [name]       Date: 12/01/2022 DOB: 14-Nov-1960  Age: 62 y.o. MRN#: [id number] Attending Physician: Starleen Arms, MD Primary Care Physician: Merri Brunette, MD Admit Date: 11/29/2022  Reason for Consultation/Follow-up: Establishing goals of care  Subjective: Medical records reviewed including progress notes and labs.  Called patient's wife [name] for ongoing support and goals of care discussions.  She tells me she had a good conversation with his primary attending today and is glad to hear that she will receive updates from him every other day.  She understands that he needs more time for outcomes and that anything can happen at any time.  Rather than meeting again today at 1 PM, she would like to revisit the conversation tomorrow or Monday based on how he progresses.  She will continue to look for advance directives in the meantime.  Patient was then assessed at the bedside after his return from HD.  He denies any concerns at this time.  His tremors are more notable than yesterday. I shared that I would be available for support throughout his hospitalization, though we may wait a day or 2 before further goals of care conversations.  He states "okay."  Questions and concerns addressed. PMT will continue to support holistically.   Length of Stay: 2  Physical Exam Vitals reviewed.  Constitutional:      General: He is not in acute distress.    Appearance: He is ill-appearing.     Comments: Right arm tremors  Cardiovascular:     Rate and Rhythm: Normal rate.  Pulmonary:     Effort: Pulmonary effort is normal.  Neurological:     Mental Status: He is alert.  Psychiatric:        Mood and Affect: Mood normal.        Behavior: Behavior normal.             Vital Signs: BP 136/85   Pulse 88   Temp 98.1 F (36.7 C) (Oral)   Resp 16   Ht 6\' 6"  (1.981 m)   Wt 120.9 kg   SpO2 100%   BMI 30.80 kg/m  SpO2: SpO2: 100 % O2 Device: O2 Device: Nasal Cannula O2 Flow Rate: O2 Flow Rate (L/min): 2 L/min  Palliative Assessment/Data: 30%    Palliative Care Assessment & Plan   Patient Profile: 62 y.o. male  with past medical history of CHF status post heart transplant in 2014, on chronic immunosuppressant, ESRD on HD MWF, HTN, HLD, PAF on Eliquis, chronically on narcotics, anxiety/depression, recently diagnosed dementia  admitted on 11/29/2022 with lethargy and confusion, as well as generalized weakness.    Patient has been admitted for sepsis and acute hypoxic respiratory failure secondary to aspiration pneumonia. PMT has been consulted to assist with goals of care conversation.  Assessment: End-stage renal disease on dialysis Acute hypoxic respiratory failure secondary to aspiration pneumonia Acute metabolic encephalopathy Goals of care conversation  Recommendations/Plan: Continue full code/full scope treatment Allow time for outcomes, watchful waiting Ongoing goals of care discussions pending clinical course PMT will continue to follow and support   Prognosis: Guarded  Discharge Planning: To Be Determined  Care plan was discussed with patient, patient's wife   Total time: I spent 35 minutes in the care of the patient today in the above activities and documenting the encounter.   Jaysa Kise Jeni Salles, PA-C  Palliative Medicine Team Team phone # [phone]  Thank you for allowing the Palliative Medicine Team to assist in the care of this patient. Please utilize secure chat with additional questions, if there is no response within 30 minutes please call the above phone number.  Palliative Medicine Team providers are available by phone from 7am to 7pm daily and can be reached through the team cell phone.  Should  this patient require assistance outside of these hours, please call the patient's attending physician.

## 2022-12-01 NOTE — Progress Notes (Addendum)
Pharmacy Antibiotic Note  [name] is a 62 y.o. male admitted on 11/29/2022 with pneumonia.  Pharmacy has been consulted for vancomycin and cefepime dosing.  Patient presents with several days of lethargy/AMS and vomiting 3/31. He is ESRD on HD MWF outpatient. CT chest showing underinflation with lung base parenchymal opacities--atelectasis versus infiltrate. Patient received 2500 mg IV vancomycin on 4/4 and 500 mg IV vancomycin on 4/5. Patient scheduled for additional dialysis session today with plans to return to MWF schedule.   Plan: Give vancomycin 750 mg IV today after dialysis Start vancomycin 1000 mg IV on MWF after dialysis Continue cefepime 1g IV q24h Vancomycin levels as needed  Monitor renal function, cultures, and overall clinical picture   Height: 6\' 6"  (198.1 cm) Weight: 120.9 kg (266 lb 8.6 oz) IBW/kg (Calculated) : 91.4  Temp (24hrs), Avg:98.1 F (36.7 C), Min:97.9 F (36.6 C), Max:98.4 F (36.9 C)  Recent Labs  Lab 11/29/22 1019 11/29/22 1830 11/30/22 1423 12/01/22 0304  WBC 9.8  --  9.2 9.7  CREATININE 10.32*  --  7.14* 7.83*  LATICACIDVEN 1.5 2.8*  --   --      Estimated Creatinine Clearance: 14.3 mL/min (A) (by C-G formula based on SCr of 7.83 mg/dL (H)).    Allergies  Allergen Reactions   Heparin Nausea Only, Swelling and Other (See Comments)    * * HIT * *  SWELLING REACTION UNSPECIFIED   DIAPHORESIS    Iodinated Contrast Media    Penicillin G Potassium [Penicillin G] Nausea Only and Other (See Comments)    DIAPHORESIS    Antimicrobials this admission: 4/4 vancomycin >>  4/4 cefepime >>   Microbiology results: 4/4 BCx: ngx2 days 4/4 resp panel: negative  4/4 MRSA PCR: not detected  Thank you for allowing pharmacy to be a part of this patient's care.  Georga Hacking, PharmD PGY1 Pharmacy Resident 4/6/202411:27 AM

## 2022-12-01 NOTE — Progress Notes (Signed)
OT Cancellation Note  Patient Details Name: [name] MRN: [id number] DOB: Nov 16, 1960   Cancelled Treatment:    Reason Eval/Treat Not Completed: Patient at procedure or test/ unavailable (HD)  [name] 12/01/2022, 10:00 AM  [name] OTR/L Acute Rehabilitation Services Office: [phone]

## 2022-12-01 NOTE — Evaluation (Signed)
Clinical/Bedside Swallow Evaluation Patient Details  Name: ALAN BERKENPAS MRN: [id number] Date of Birth: December 02, 1960  Today's Date: 12/01/2022 Time: SLP Start Time (ACUTE ONLY): 1530 SLP Stop Time (ACUTE ONLY): 1545 SLP Time Calculation (min) (ACUTE ONLY): 15 min  Past Medical History:  Past Medical History:  Diagnosis Date   Arthritis    Atrial fibrillation (HCC)    CHF (congestive heart failure), NYHA class III (HCC)    s/p heart transplant   Chronic kidney disease    end stage   Chronic systolic dysfunction of left ventricle    CVA (cerebral infarction)    Diabetes mellitus    PMH; Prior to heart transplant   Family history of coronary artery disease    in both parents   GERD (gastroesophageal reflux disease)    Hyperlipidemia    Hypertension    Left bundle branch block    Morbid obesity (HCC)    status post lap band   Myocardial infarction [facility])    prior to heart transplant   Nonischemic cardiomyopathy (HCC)    prior to heart transplant   Obesity (BMI 30-39.9)    Obstructive sleep apnea    no longer needs CPAP after heart transplant per pt   Premature ventricular contractions    Prostate cancer (HCC)    SOB (shortness of breath)    Past Surgical History:  Past Surgical History:  Procedure Laterality Date   CARDIAC PACEMAKER PLACEMENT  09/21/2009   Biventricular implantable cardioverter-defibrillator implantation      COLONOSCOPY W/ BIOPSIES AND POLYPECTOMY     CYSTOSCOPY WITH FULGERATION N/A 04/27/2021   Procedure: CYSTOSCOPY AND CLOT EVACUATION WITH BLADDER BIOPSY/ FUGARATION OF BLADDER/ FULGARATION OF PROSTATE ;  Surgeon: Jerilee Field, MD;  Location: [facility];  Service: Urology;  Laterality: N/A;   FOOT SURGERY     left   HEART TRANSPLANT  2014   LAPAROSCOPIC GASTRIC BANDING  01/27/2007   LEFT VENTRICULAR ASSIST DEVICE     implanted at [facility]   PACEMAKER REMOVAL     PROSTATE BIOPSY     TOTAL HIP ARTHROPLASTY Right 07/22/2017   Procedure: RIGHT TOTAL HIP  ARTHROPLASTY ANTERIOR APPROACH;  Surgeon: Samson Frederic, MD;  Location: [facility] OR;  Service: Orthopedics;  Laterality: Right;  Needs RNFA   TOTAL KNEE ARTHROPLASTY Right 07/22/2017   HPI:  Patient is a 62 y.o. male with PMH: CVA, GERD, CHF s/p heart transplant in 2014, chronic immunosuppressant, ESRD on HD MWF, HTN, HLD, PAF on Eliquis, anxiety/depression, dementia. He presented to the hospital on 11/29/2022 with symptoms of SOB, feeling tired with lethargy and confusion. CXR showed right lower lobe PNA; CT head was negative for acute intracranial abnormality.    Assessment / Plan / Recommendation  Clinical Impression  Patient presents with clinical s/s of what appears to be a cognitive-based dysphagia as per this bedside swallow evaluation. Swallow initiation was timely and no overt s/s aspiration or penetration with straw sips of thin liquids. He did exhibit prolonged mastication of regular solids (had been cut up into approximately dysphagia 3 texture) and generally he kept his head down when chewing. Question swallow initiation delay with solids, however likely primarily an oral phase delay. Patient did exhibit one delayed mild cough response after PO intake. SLP recommending continue with current diet but will reassess at bedside next 1-2 dates as today patient appears lethargic/drowsy. SLP Visit Diagnosis: Dysphagia, unspecified (R13.10)    Aspiration Risk  Mild aspiration risk    Diet Recommendation Regular;Dysphagia 3 (Mech soft);Thin  liquid   Liquid Administration via: Cup;Straw Medication Administration: Whole meds with liquid Supervision: Patient able to self feed;Full supervision/cueing for compensatory strategies Compensations: Slow rate;Small sips/bites;Minimize environmental distractions Postural Changes: Seated upright at 90 degrees    Other  Recommendations Oral Care Recommendations: Oral care BID    Recommendations for follow up therapy are one component of a multi-disciplinary  discharge planning process, led by the attending physician.  Recommendations may be updated based on patient status, additional functional criteria and insurance authorization.  Follow up Recommendations Skilled nursing-short term rehab (<3 hours/day)      Assistance Recommended at Discharge Frequent or constant Supervision/Assistance  Functional Status Assessment Patient has had a recent decline in their functional status and demonstrates the ability to make significant improvements in function in a reasonable and predictable amount of time.  Frequency and Duration min 1 x/week          Prognosis Prognosis for improved oropharyngeal function: Good      Swallow Study   General Date of Onset: 11/29/22 HPI: Patient is a 62 y.o. male with PMH: CVA, GERD, CHF s/p heart transplant in 2014, chronic immunosuppressant, ESRD on HD MWF, HTN, HLD, PAF on Eliquis, anxiety/depression, dementia. He presented to the hospital on 11/29/2022 with symptoms of SOB, feeling tired with lethargy and confusion. CXR showed right lower lobe PNA; CT head was negative for acute intracranial abnormality. Type of Study: Bedside Swallow Evaluation Previous Swallow Assessment: none found Diet Prior to this Study: Regular;Thin liquids (Level 0) Temperature Spikes Noted: No Respiratory Status: Room air History of Recent Intubation: No Behavior/Cognition: Alert;Cooperative;Lethargic/Drowsy;Requires cueing Oral Cavity Assessment: Within Functional Limits Oral Care Completed by SLP: No Oral Cavity - Dentition: Adequate natural dentition Vision: Functional for self-feeding Self-Feeding Abilities: Needs set up;Able to feed self Patient Positioning: Upright in chair Baseline Vocal Quality: Normal Volitional Cough: Cognitively unable to elicit Volitional Swallow: Unable to elicit    Oral/Motor/Sensory Function Overall Oral Motor/Sensory Function: Within functional limits   Ice Chips     Thin Liquid Thin Liquid: Within  functional limits Presentation: Straw    Nectar Thick     Honey Thick     Puree Puree: Within functional limits Presentation: Spoon   Solid     Solid: Impaired Oral Phase Impairments: Impaired mastication Oral Phase Functional Implications: Prolonged oral transit;Impaired mastication Pharyngeal Phase Impairments: Cough - Delayed      Angela Nevin, MA, CCC-SLP Speech Therapy

## 2022-12-01 NOTE — Progress Notes (Signed)
[facility] Progress Note   Subjective: Seen in HD unit - attempting 2L UF. No new complaints. Remains lethargic. Seen by palliative care for GOC.    Objective Vitals:   12/01/22 0408 12/01/22 0731 12/01/22 0816 12/01/22 0822  BP: 127/78   123/80  Pulse: 84   83  Resp: 18   (!) 22  Temp: 98.4 F (36.9 C)   98.1 F (36.7 C)  TempSrc: Oral   Oral  SpO2: 96% 98%  100%  Weight:   120.9 kg   Height:         Additional Objective Labs: Basic Metabolic Panel: Recent Labs  Lab 11/29/22 1019 11/29/22 1037 11/30/22 1423 12/01/22 0304  NA 137 133* 133* 133*  K 4.9 4.7 4.0 4.0  CL 96*  --  97* 93*  CO2 26  --  25 26  GLUCOSE 92  --  79 78  BUN 42*  --  32* 39*  CREATININE 10.32*  --  7.14* 7.83*  CALCIUM 10.7*  --  9.6 10.3  PHOS  --   --   --  3.5    CBC: Recent Labs  Lab 11/29/22 1019 11/29/22 1037 11/30/22 1423 12/01/22 0304  WBC 9.8  --  9.2 9.7  NEUTROABS 8.1*  --   --   --   HGB 9.5* 10.2* 8.1* 9.4*  HCT 28.3* 30.0* 24.8* 28.4*  MCV 94.3  --  93.9 94.0  PLT 206  --  195 192    Blood Culture    Component Value Date/Time   SDES BLOOD SITE NOT SPECIFIED 11/29/2022 1830   SPECREQUEST  11/29/2022 1830    BOTTLES DRAWN AEROBIC AND ANAEROBIC Blood Culture adequate volume   CULT  11/29/2022 1830    NO GROWTH < 24 HOURS Performed at [facility], [address] [place], [place] [address]    REPTSTATUS PENDING 11/29/2022 1830     Physical Exam General: Lying in bed, drowsy Heart: RRR No m,r Lungs: Clear bilaterally Abdomen: soft, no masses  Extremities: 1 + LE edema Dialysis Access: R AVF +bruit   Medications:  ceFEPime (MAXIPIME) IV Stopped (11/30/22 1007)    acetaminophen  650 mg Oral Q6H   apixaban  2.5 mg Oral BID   Chlorhexidine Gluconate Cloth  6 each Topical Q0600   cycloSPORINE modified  100 mg Oral q AM   And   cycloSPORINE modified  75 mg Oral QHS   divalproex  250 mg Oral Daily   divalproex  500 mg Oral QHS    DULoxetine  60 mg Oral Daily   guaiFENesin  1,200 mg Oral BID   ipratropium-albuterol  3 mL Nebulization Q6H   midodrine  10 mg Oral TID WC   mycophenolate  720 mg Oral BID   pregabalin  300 mg Oral Daily   rosuvastatin  10 mg Oral Daily   sevelamer carbonate  1,600 mg Oral TID WC   vancomycin variable dose per unstable renal function (pharmacist dosing)   Does not apply See admin instructions    Dialysis Orders:  MWF [facility]  4h  500/2.0   120kg   3K/2Ca bath  AVF   Hep none - last HD 4/01, post wt 120.3kg  - mircera 120mg  IV q 4 wks, last 3/29, due 4/26 - no vdra  Assessment/Plan: AHRF/ bilateral PNA by CT - w/ cough, rhonchi, no sig fevers / wbc here. Possibly aspirating. On antibiotics.  Tremors/ dementia/ debility - pt has become WC  bound w/ deterioriating neuro condition over the last 3-6 mos per family.  Follows w/ [place] [facility].  ESRD - on HD MWF.  Got off schedule this week ---HD Sat then back on schedule Monday.  HTN/ volume - mild vol excess on exam. CT w/o any lung edema, and BP's soft. Only tolerated 0.5L with last HD. UF as able.  Chronic hypotension - takes midodrine 10mg  tid at home, continue here.  Anemia esrd - Hb 9-11 here, follow.  MBD ckd - CCa elevated, not on any vdra or Ca++ containing binders. Phos in range.    H/o heart transplant - takes CyA and myfortic  H/o CVA H/o prostate cancer Atrial fib IDDM H/o charcot foot GOC - signficant decline per wife/ dtr over the last 6 mos. Has memory loss/ regular hallucinations, tremors, possible dysphagia, and physical decline now mostly WC bound. Palliative care consulted --remains full code/full scope.   Tomasa Blase PA-C [facility] 12/01/2022,9:00 AM

## 2022-12-02 DIAGNOSIS — J189 Pneumonia, unspecified organism: Secondary | ICD-10-CM | POA: Diagnosis not present

## 2022-12-02 MED ORDER — NEPRO/CARBSTEADY PO LIQD
237.0000 mL | Freq: Two times a day (BID) | ORAL | Status: DC
  Administered 2022-12-02 – 2022-12-03 (×2): 237 mL via ORAL

## 2022-12-02 MED ORDER — VANCOMYCIN HCL IN DEXTROSE 1-5 GM/200ML-% IV SOLN: 1000.0000 mg | INTRAVENOUS | Status: DC

## 2022-12-02 MED ORDER — TRAZODONE HCL 50 MG PO TABS
50.0000 mg | ORAL_TABLET | Freq: Every evening | ORAL | Status: DC | PRN
  Administered 2022-12-02: 50 mg via ORAL
  Filled 2022-12-02: qty 1

## 2022-12-02 MED ORDER — RENA-VITE PO TABS
1.0000 | ORAL_TABLET | Freq: Every day | ORAL | Status: DC
  Administered 2022-12-02 – 2022-12-04 (×3): 1 via ORAL
  Filled 2022-12-02 (×3): qty 1

## 2022-12-02 MED ORDER — ORAL CARE MOUTH RINSE: 15.0000 mL | OROMUCOSAL | Status: DC | PRN

## 2022-12-02 MED ORDER — IPRATROPIUM-ALBUTEROL 0.5-2.5 (3) MG/3ML IN SOLN
3.0000 mL | Freq: Two times a day (BID) | RESPIRATORY_TRACT | Status: DC
  Administered 2022-12-02 – 2022-12-04 (×4): 3 mL via RESPIRATORY_TRACT
  Filled 2022-12-02 (×5): qty 3

## 2022-12-02 NOTE — Progress Notes (Signed)
Initial Nutrition Assessment  DOCUMENTATION CODES:   Not applicable  INTERVENTION:  - Liberalize diet to Regular 1200 mL - Add Nepro Shake po BID, each supplement provides 425 kcal and 19 grams protein - Add Rena-vit   NUTRITION DIAGNOSIS:   Inadequate oral intake related to lethargy/confusion, poor appetite as evidenced by meal completion < 50%.  GOAL:   Patient will meet greater than or equal to 90% of their needs  MONITOR:   PO intake, Supplement acceptance  REASON FOR ASSESSMENT:   Consult Assessment of nutrition requirement/status  ASSESSMENT:   62 y.o. male admits related to SOB and feeling tired. PMH includes: arthritis, afib, CHF, CKD, DM, GERD, HLD, HTN, myocardial infarction, prostate cancer. Pt is currently receiving medical management related to PNA.  Meds reviewed: renvela. Labs reviewed: Na low, BUN/Creatinine elevated.   Pt is currently oriented x2. Per record, pt has experienced a 15% wt loss in 1 month, which is significant. Pt has ESRD and is currently on HD MWF. RN reports that the pt ate about 25% of his breakfast this am. RD will liberalize to a Regular diet  with 1200 mL fluid restriction. Will add supplements as well and continue to monitor PO intakes.   NUTRITION - FOCUSED PHYSICAL EXAM: Remote assessment - attempt at f/u.   Diet Order:   Diet Order             Diet renal/carb modified with fluid restriction Diet-HS Snack? Nothing; Fluid restriction: 1200 mL Fluid; Room service appropriate? Yes; Fluid consistency: Thin  Diet effective now                   EDUCATION NEEDS:   Not appropriate for education at this time  Skin:  Skin Assessment: Reviewed RN Assessment  Last BM:  PTA  Height:   Ht Readings from Last 1 Encounters:  11/29/22 6\' 6"  (1.981 m)    Weight:   Wt Readings from Last 1 Encounters:  12/01/22 118.9 kg    Ideal Body Weight:     BMI:  Body mass index is 30.29 kg/m.  Estimated Nutritional Needs:    Kcal:  2900-3500 kcals  Protein:  145-175 gm  Fluid:  >/= 2.9 L  Bethann Humble, RD, LDN, CNSC.

## 2022-12-02 NOTE — Progress Notes (Signed)
Speech Language Pathology Treatment: Cognitive-Linquistic  Patient Details Name: MAXIMILLAN BAIAMONTE MRN: [id number] DOB: [birth year] Today's Date: 12/02/2022 Time: 1443-1540 SLP Time Calculation (min) (ACUTE ONLY): 10 min  Assessment / Plan / Recommendation Clinical Impression  Patient seen by SLP for for cognitive-linguistic treatment. Session was brief overall as patient not participating adequately. Initial intention of SLP was for dysphagia treatment, however patient is refusing/declining to eat today. Per RN, he has not eaten meals despite frequent attempts from staff. Patient was more interactive and was able to give responses at phrase level rather than one-word level like yesterday. He did recall having had dialysis previous date and that he has been in the hospital for "three days". He become irritated by pulse ox on ear and demanded it be removed. SLP able to redirect somewhat but patient would frequently return to this complaint. No family present to determine if he is far from his baseline cognition. SLP will follow for dysphagia goals and attempt f/u for cognition to determine if he is at or near baseline.   HPI HPI: Patient is a 62 y.o. male with PMH: CVA, GERD, CHF s/p heart transplant in 2014, chronic immunosuppressant, ESRD on HD MWF, HTN, HLD, PAF on Eliquis, anxiety/depression, dementia. He presented to the hospital on 11/29/2022 with symptoms of SOB, feeling tired with lethargy and confusion. CXR showed right lower lobe PNA; CT head was negative for acute intracranial abnormality.      SLP Plan  Continue with current plan of care      Recommendations for follow up therapy are one component of a multi-disciplinary discharge planning process, led by the attending physician.  Recommendations may be updated based on patient status, additional functional criteria and insurance authorization.    Recommendations   SNF                  Oral care BID   Frequent or constant  Supervision/Assistance Cognitive communication deficit (G86.761)     Continue with current plan of care     Angela Nevin, MA, CCC-SLP Speech Therapy

## 2022-12-02 NOTE — Evaluation (Signed)
Occupational Therapy Evaluation Patient Details Name: GAD EMGE MRN: [id number] DOB: 04-09-1961 Today's Date: 12/02/2022   History of Present Illness 62 y/o M admitted to [facility] on 4/4 for SOB and feeling tired with lethargy and confusion. Chest x-ray showing right lower lobe pneumonia, head CT without acute intracranial abnormality. PMHx: CHF s/p heart transplant in 2014, chronic immunosuppressant, ESRD on HD MWF, HTN, HLD, PAF on Eliquis, anxiety/depression, dementia.   Clinical Impression   PTA< pt lived with his family who per chart assists with mobility and ADL; pt reports independence. Pt performing UB ADL with set-up and LB ADL with up to mod A. Able to doff socks sitting EOB without assist. Mod A to rise from EOB. Per pt, family can assist as needed; Recommending OP OT to optimize safety and independence in ADL and IADL. If family unable to provide transportation or assist with ADL, may need to update to [facility] or rehab pending support.      Recommendations for follow up therapy are one component of a multi-disciplinary discharge planning process, led by the attending physician.  Recommendations may be updated based on patient status, additional functional criteria and insurance authorization.   Assistance Recommended at Discharge Frequent or constant Supervision/Assistance  Patient can return home with the following A lot of help with walking and/or transfers;A lot of help with bathing/dressing/bathroom;Assistance with cooking/housework;Assist for transportation;Help with stairs or ramp for entrance;Direct supervision/assist for financial management;Direct supervision/assist for medications management;Assistance with feeding    Functional Status Assessment  Patient has had a recent decline in their functional status and demonstrates the ability to make significant improvements in function in a reasonable and predictable amount of time.  Equipment Recommendations  BSC/3in1     Recommendations for Other Services       Precautions / Restrictions Precautions Precautions: Fall;Other (comment) Precaution Comments: watch O2 Restrictions Weight Bearing Restrictions: No      Mobility Bed Mobility Overal bed mobility: Needs Assistance Bed Mobility: Supine to Sit, Sit to Supine     Supine to sit: Min guard, HOB elevated Sit to supine: Min guard   General bed mobility comments: incresad time, somewhat impulsive. Increased time to follow commands as well.    Transfers Overall transfer level: Needs assistance Equipment used: 1 person hand held assist Transfers: Sit to/from Stand, Bed to chair/wheelchair/BSC Sit to Stand: Min assist, From elevated surface Stand pivot transfers: Mod assist, From elevated surface         General transfer comment: minA for power up and steady with 1HHA provided, modA for pivoting for safety and slowing descent onto EOB      Balance Overall balance assessment: Needs assistance Sitting-balance support: Bilateral upper extremity supported, Feet supported Sitting balance-Leahy Scale: Fair     Standing balance support: Single extremity supported, During functional activity Standing balance-Leahy Scale: Poor                             ADL either performed or assessed with clinical judgement   ADL Overall ADL's : Needs assistance/impaired Eating/Feeding: Set up;Sitting   Grooming: Set up;Sitting   Upper Body Bathing: Minimal assistance   Lower Body Bathing: Moderate assistance;Maximal assistance;Sit to/from stand   Upper Body Dressing : Set up;Sitting   Lower Body Dressing: Maximal assistance;Moderate assistance;Sit to/from stand   Toilet Transfer: Moderate assistance;Stand-pivot Toilet Transfer Details (indicate cue type and reason): one person hand held assist Toileting- Clothing Manipulation and Hygiene: Maximal assistance;Sit to/from stand  Functional mobility during ADLs: Moderate  assistance General ADL Comments: Very few steps this session. Per chart, pt usually gets to wheelchair     Vision Patient Visual Report: No change from baseline Vision Assessment?: No apparent visual deficits Additional Comments: WFL for tasks assessed     Perception Perception Perception Tested?: No   Praxis Praxis Praxis tested?: Not tested    Pertinent Vitals/Pain Pain Assessment Pain Assessment: No/denies pain     Hand Dominance Right   Extremity/Trunk Assessment Upper Extremity Assessment Upper Extremity Assessment: Generalized weakness   Lower Extremity Assessment Lower Extremity Assessment: Defer to PT evaluation   Cervical / Trunk Assessment Cervical / Trunk Assessment: Kyphotic   Communication Communication Communication: No difficulties (can express self and identify basic needs with incresed time for verbal coommunication)   Cognition Arousal/Alertness: Awake/alert, Lethargic Behavior During Therapy: Flat affect Overall Cognitive Status: No family/caregiver present to determine baseline cognitive functioning                                 General Comments: Pt following basic commands with incresed time; likes self directed taskls. Noting difficulty problem solving during mobility. MIn-mod cues throughout     General Comments  SpO2>90 throughout session    Exercises     Shoulder Instructions      Home Living Family/patient expects to be discharged to:: Private residence Living Arrangements: Spouse/significant other;Children Available Help at Discharge: Family;Available 24 hours/day Type of Home: House Home Access: Stairs to enter Entergy CorporationEntrance Stairs-Number of Steps: 6 (working to try to get a ramp per daughter) Entrance Stairs-Rails: Right;Left Home Layout: One level     Bathroom Shower/Tub: Producer, television/film/videoWalk-in shower   Bathroom Toilet: Standard     Home Equipment: Agricultural consultantolling Walker (2 wheels);Shower seat;Wheelchair - manual   Additional  Comments: family assists full time per chart review  Lives With: Spouse    Prior Functioning/Environment Prior Level of Function : Needs assist             Mobility Comments: pt reports performing bed mobility and transfers independently to wheelchair with stand-pivot method. Assist needed for wheelchair propulsion. Per chart review and discussion with pt, he was receiving OPPT with family providing transportation ADLs Comments: Rer pt, he was perfroming ADL independently and family performs cooking and cleaning        OT Problem List: Decreased strength;Impaired balance (sitting and/or standing);Decreased activity tolerance;Decreased cognition;Decreased knowledge of use of DME or AE;Decreased safety awareness      OT Treatment/Interventions: Self-care/ADL training;Therapeutic exercise;DME and/or AE instruction;Patient/family education;Balance training;Therapeutic activities    OT Goals(Current goals can be found in the care plan section) Acute Rehab OT Goals Patient Stated Goal: go back to outpatient therapy OT Goal Formulation: With patient Time For Goal Achievement: 12/16/22 Potential to Achieve Goals: Good  OT Frequency: Min 2X/week    Co-evaluation              AM-PAC OT "6 Clicks" Daily Activity     Outcome Measure Help from another person eating meals?: A Little Help from another person taking care of personal grooming?: A Little Help from another person toileting, which includes using toliet, bedpan, or urinal?: A Little Help from another person bathing (including washing, rinsing, drying)?: A Lot Help from another person to put on and taking off regular upper body clothing?: A Little Help from another person to put on and taking off regular lower body clothing?: A Lot 6  Click Score: 16   End of Session Equipment Utilized During Treatment: Gait belt Nurse Communication: Mobility status  Activity Tolerance: Patient tolerated treatment well Patient left: in  bed;with call bell/phone within reach;with bed alarm set  OT Visit Diagnosis: Unsteadiness on feet (R26.81);Muscle weakness (generalized) (M62.81);Other symptoms and signs involving cognitive function;Other abnormalities of gait and mobility (R26.89)                Time: 7014-1030 OT Time Calculation (min): 23 min Charges:  OT General Charges $OT Visit: 1 Visit OT Evaluation $OT Eval Moderate Complexity: 1 Mod OT Treatments $Self Care/Home Management : 8-22 mins  Tyler Deis, OTR/L [facility] Acute Rehabilitation Office: [phone]   Myrla Halsted 12/02/2022, 1:47 PM

## 2022-12-02 NOTE — Progress Notes (Addendum)
[facility] Progress Note   Subjective:  Seen in room. A little more alert today, confused about the date. Denies cp,sob, n/v.    Objective Vitals:   12/02/22 0128 12/02/22 0317 12/02/22 0758 12/02/22 0806  BP:  113/64  107/70  Pulse:  94  99  Resp:  18  (!) 21  Temp:  98 F (36.7 C)  98.7 F (37.1 C)  TempSrc:  Oral  Oral  SpO2: 98% 97% 97% 99%  Weight:      Height:         Additional Objective Labs: Basic Metabolic Panel: Recent Labs  Lab 11/29/22 1019 11/29/22 1037 11/30/22 1423 12/01/22 0304  NA 137 133* 133* 133*  K 4.9 4.7 4.0 4.0  CL 96*  --  97* 93*  CO2 26  --  25 26  GLUCOSE 92  --  79 78  BUN 42*  --  32* 39*  CREATININE 10.32*  --  7.14* 7.83*  CALCIUM 10.7*  --  9.6 10.3  PHOS  --   --   --  3.5    CBC: Recent Labs  Lab 11/29/22 1019 11/29/22 1037 11/30/22 1423 12/01/22 0304  WBC 9.8  --  9.2 9.7  NEUTROABS 8.1*  --   --   --   HGB 9.5* 10.2* 8.1* 9.4*  HCT 28.3* 30.0* 24.8* 28.4*  MCV 94.3  --  93.9 94.0  PLT 206  --  195 192    Blood Culture    Component Value Date/Time   SDES BLOOD SITE NOT SPECIFIED 11/29/2022 1830   SPECREQUEST  11/29/2022 1830    BOTTLES DRAWN AEROBIC AND ANAEROBIC Blood Culture adequate volume   CULT  11/29/2022 1830    NO GROWTH 2 DAYS Performed at [facility], [address], [place], [place] [address]    REPTSTATUS PENDING 11/29/2022 1830     Physical Exam General: Lying in bed, drowsy Heart: RRR No m,r Lungs: Clear bilaterally Abdomen: soft, no masses  Extremities: Trace LE edema Dialysis Access: R AVF +bruit   Medications:  ceFEPime (MAXIPIME) IV 1 g (12/02/22 0812)   [START ON 12/03/2022] vancomycin      acetaminophen  650 mg Oral Q6H   apixaban  2.5 mg Oral BID   Chlorhexidine Gluconate Cloth  6 each Topical Q0600   cycloSPORINE modified  100 mg Oral q AM   And   cycloSPORINE modified  75 mg Oral QHS   divalproex  250 mg Oral Daily   divalproex  500 mg Oral QHS    DULoxetine  60 mg Oral Daily   guaiFENesin  1,200 mg Oral BID   ipratropium-albuterol  3 mL Nebulization BID   midodrine  10 mg Oral TID WC   mycophenolate  720 mg Oral BID   pregabalin  75 mg Oral Daily   rosuvastatin  10 mg Oral Daily   sevelamer carbonate  1,600 mg Oral TID WC    Dialysis Orders:  [facility]  4h  500/2.0   120kg   3K/2Ca bath  AVF   Hep none - last HD 4/01, post wt 120.3kg  - mircera 120mg  IV q 4 wks, last 3/29, due 4/26 - no vdra  Assessment/Plan: AHRF/ bilateral PNA by CT - possibly aspirating. IV antibiotics per pmd.  Tremors/ dementia/ debility - neuro condition declining over last 6 mos per family.  F/b [place].  ESRD - on HD MWF.  Got off schedule this week ---HD Sat  then back on schedule Monday.  HTN/ volume - mild vol excess on exam. CT w/o any lung edema, and BP's soft. Tolerated 2L UF with last HD.  Chronic hypotension - takes midodrine 10mg  tid at home, continue here.  Anemia esrd - Hb 9-11 here, follow.  MBD ckd - CCa elevated, not on any vdra or Ca++ containing binders. Phos in range.    H/o heart transplant - takes CyA and myfortic  H/o CVA H/o prostate cancer Atrial fib IDDM H/o charcot foot Prognosis - signficant decline over the last 6 mos per family. Seen by PCT, full scope treatment for now.   [name] Blase PA-C [facility] 12/02/2022,9:15 AM  Pt seen, examined and agree w A/P as above.  [name] Moselle  MD 12/02/2022, 3:24 PM

## 2022-12-02 NOTE — Progress Notes (Signed)
PROGRESS NOTE    [name]  JKK:[id number] DOB: 05/24/1961 DOA: 11/29/2022 PCP: Merri Brunette, MD   Chief Complaint  Patient presents with   Shortness of Breath    Brief Narrative:   [name] is a 62 y.o. male with medical history significant of CHF status post heart transplant in 2014, on chronic immunosuppressant, ESRD on HD MWF, HTN, HLD, PAF on Eliquis, obesity, chronic on narcotics, anxiety/depression, recently diagnosed dementia, brought in by family member for evaluation of worsening of generalized weakness lethargy and confusion.   in ED, patient was found to be hypoxic stabilized on 7 L via high flow oxygen.  Blood pressure borderline low but according to patient's family his blood pressure SBP in the range of 80-90s.  Borderline tachycardia, afebrile.  Chest x-ray showed right lower lobe pneumonia.  Blood work showed WBC 9.8, creatinine 10.3 BUN 42, bicarb 26, K4.9.  Patient was started on cefepime and vancomycin   Assessment & Plan:   Principal Problem:   PNA (pneumonia) Active Problems:   Atrial fibrillation   Acute encephalopathy   AMS (altered mental status)   Aspiration pneumonia   Acute hypoxic respiratory failure -Secondary to aspiration pneumonia, likely from vomiting episode of vomiting on Sunday. -Aspiration precaution -Breathing treatment, DuoNeb every 6 hours, incentive spirometry and flutter valve. -Encouraged to use incentive spirometry and flutter valve. -He is on room air today.   Sepsis secondary to multifocal pneumonia Sepsis present on admission -Evidenced by tachypneic, and hypoxic with symptoms signs of endorgan damage of acute encephalopathy, and lactic acid of 2.8 -Given he is immunocompromise, he was kept on broad-spectrum IV antibiotics, now he appears to be improving, and he is MRSA PCR negative, I will DC his vancomycin and continue with IV cefepime.   -CT chest significant for extensive bibasilar opacities  Acute metabolic  encephalopathy -Secondary to sepsis, management as above. -did decrease Lyrica from 300 mg to 75 mg given he is ESRD patient    History of heart transplant -Last seen by [facility] heart failure and transplant team in November 2023 and was considered to be stable.   -Continue current immune suppressant regimen including cyclosporine and mycophenolate   ESRD on HD MWF -Consulted, dialyzed overnight, he is on Monday Wednesday Friday schedule   IDDM -Sliding scale for now   PAF -Sinus rhythm, continue Eliquis   Hypotension -Family reported blood pressure appears to be at baseline, continue home regimen of midodrine   Hx of stroke -Stable, continue Eliquis      DVT prophylaxis: Eliquis Code Status: Full Family Communication: None at bedside, discussed with wife by phone 4/6 Disposition:   Status is: Inpatient    Consultants:  Renal Palliative  Subjective:  Significant events overnight as discussed with staff, he denies any complaints today, he tolerated sitting bedside chair.    Objective: Vitals:   12/02/22 0128 12/02/22 0317 12/02/22 0758 12/02/22 0806  BP:  113/64  107/70  Pulse:  94  99  Resp:  18  (!) 21  Temp:  98 F (36.7 C)  98.7 F (37.1 C)  TempSrc:  Oral  Oral  SpO2: 98% 97% 97% 99%  Weight:      Height:        Intake/Output Summary (Last 24 hours) at 12/02/2022 0936 Last data filed at 12/01/2022 1230 Gross per 24 hour  Intake --  Output 2000 ml  Net -2000 ml   Filed Weights   11/30/22 0656 12/01/22 0816 12/01/22 1300  Weight:  116.9 kg 120.9 kg 118.9 kg    Examination:  Patient appears more awake and oriented and conversant today mentation much improved, remains minimally confused.   frail, chronically ill-appearing, and deconditioned.. Symmetrical Chest wall movement, Good air movement bilaterally, CTAB RRR,No Gallops,Rubs or new Murmurs, No Parasternal Heave +ve B.Sounds, Abd Soft, No tenderness, No rebound - guarding or rigidity. No  Cyanosis, Clubbing or edema, No new Rash or bruise        Data Reviewed: I have personally reviewed following labs and imaging studies  CBC: Recent Labs  Lab 11/29/22 1019 11/29/22 1037 11/30/22 1423 12/01/22 0304  WBC 9.8  --  9.2 9.7  NEUTROABS 8.1*  --   --   --   HGB 9.5* 10.2* 8.1* 9.4*  HCT 28.3* 30.0* 24.8* 28.4*  MCV 94.3  --  93.9 94.0  PLT 206  --  195 192    Basic Metabolic Panel: Recent Labs  Lab 11/29/22 1019 11/29/22 1037 11/30/22 1423 12/01/22 0304  NA 137 133* 133* 133*  K 4.9 4.7 4.0 4.0  CL 96*  --  97* 93*  CO2 26  --  25 26  GLUCOSE 92  --  79 78  BUN 42*  --  32* 39*  CREATININE 10.32*  --  7.14* 7.83*  CALCIUM 10.7*  --  9.6 10.3  PHOS  --   --   --  3.5    GFR: Estimated Creatinine Clearance: 14.2 mL/min (A) (by C-G formula based on SCr of 7.83 mg/dL (H)).  Liver Function Tests: Recent Labs  Lab 11/29/22 1019 12/01/22 0304  AST 14*  --   ALT 18  --   ALKPHOS 85  --   BILITOT 1.1  --   PROT 6.2*  --   ALBUMIN 2.4* 2.1*    CBG: Recent Labs  Lab 11/29/22 1044  GLUCAP 75     Recent Results (from the past 240 hour(s))  Resp panel by RT-PCR (RSV, Flu A&B, Covid) Anterior Nasal Swab     Status: None   Collection Time: 11/29/22 10:27 AM   Specimen: Anterior Nasal Swab  Result Value Ref Range Status   SARS Coronavirus 2 by RT PCR NEGATIVE NEGATIVE Final   Influenza A by PCR NEGATIVE NEGATIVE Final   Influenza B by PCR NEGATIVE NEGATIVE Final    Comment: (NOTE) The Xpert Xpress SARS-CoV-2/FLU/RSV plus assay is intended as an aid in the diagnosis of influenza from Nasopharyngeal swab specimens and should not be used as a sole basis for treatment. Nasal washings and aspirates are unacceptable for Xpert Xpress SARS-CoV-2/FLU/RSV testing.  Fact Sheet for Patients: BloggerCourse.com  Fact Sheet for Healthcare Providers: SeriousBroker.it  This test is not yet approved or  cleared by the [place] FDA and has been authorized for detection and/or diagnosis of SARS-CoV-2 by FDA under an Emergency Use Authorization (EUA). This EUA will remain in effect (meaning this test can be used) for the duration of the COVID-19 declaration under Section 564(b)(1) of the Act, 21 U.S.C. section 360bbb-3(b)(1), unless the authorization is terminated or revoked.     Resp Syncytial Virus by PCR NEGATIVE NEGATIVE Final    Comment: (NOTE) Fact Sheet for Patients: BloggerCourse.com  Fact Sheet for Healthcare Providers: SeriousBroker.it  This test is not yet approved or cleared by the [place] FDA and has been authorized for detection and/or diagnosis of SARS-CoV-2 by FDA under an Emergency Use Authorization (EUA). This EUA will remain in effect (meaning this test can be  used) for the duration of the COVID-19 declaration under Section 564(b)(1) of the Act, 21 U.S.C. section 360bbb-3(b)(1), unless the authorization is terminated or revoked.  Performed at [facility], [address] [place], [place] [address]   Blood Culture (routine x 2)     Status: None (Preliminary result)   Collection Time: 11/29/22 10:27 AM   Specimen: BLOOD LEFT ARM  Result Value Ref Range Status   Specimen Description BLOOD LEFT ARM  Final   Special Requests   Final    BOTTLES DRAWN AEROBIC AND ANAEROBIC Blood Culture results may not be optimal due to an excessive volume of blood received in culture bottles   Culture   Final    NO GROWTH 3 DAYS Performed at [facility], [address], [place], [place] [address]    Report Status PENDING  Incomplete  MRSA Next Gen by PCR, Nasal     Status: None   Collection Time: 11/29/22 10:41 AM   Specimen: Nasal Mucosa; Nasal Swab  Result Value Ref Range Status   MRSA by PCR Next Gen NOT DETECTED NOT DETECTED Final    Comment: (NOTE) The GeneXpert MRSA Assay (FDA approved for NASAL  specimens only), is one component of a comprehensive MRSA colonization surveillance program. It is not intended to diagnose MRSA infection nor to guide or monitor treatment for MRSA infections. Test performance is not FDA approved in patients less than 75 years old. Performed at [facility], [address] [place], [place] [address]   Blood Culture (routine x 2)     Status: None (Preliminary result)   Collection Time: 11/29/22  6:30 PM   Specimen: BLOOD  Result Value Ref Range Status   Specimen Description BLOOD SITE NOT SPECIFIED  Final   Special Requests   Final    BOTTLES DRAWN AEROBIC AND ANAEROBIC Blood Culture adequate volume   Culture   Final    NO GROWTH 3 DAYS Performed at [facility], [address] [place], [place] [address]    Report Status PENDING  Incomplete         Radiology Studies: No results found.      Scheduled Meds:  acetaminophen  650 mg Oral Q6H   apixaban  2.5 mg Oral BID   Chlorhexidine Gluconate Cloth  6 each Topical Q0600   cycloSPORINE modified  100 mg Oral q AM   And   cycloSPORINE modified  75 mg Oral QHS   divalproex  250 mg Oral Daily   divalproex  500 mg Oral QHS   DULoxetine  60 mg Oral Daily   guaiFENesin  1,200 mg Oral BID   ipratropium-albuterol  3 mL Nebulization BID   midodrine  10 mg Oral TID WC   mycophenolate  720 mg Oral BID   pregabalin  75 mg Oral Daily   rosuvastatin  10 mg Oral Daily   sevelamer carbonate  1,600 mg Oral TID WC   Continuous Infusions:  ceFEPime (MAXIPIME) IV 1 g (12/02/22 0812)   [START ON 12/03/2022] vancomycin       LOS: 3 days     Huey Bienenstock, MD [facility]   To contact the attending provider between 7A-7P or the covering provider during after hours 7P-7A, please log into the web site [email or web address] and access using universal [place] password for that web site. If you do not have the password, please call the hospital operator.  12/02/2022, 9:36 AM

## 2022-12-02 NOTE — Progress Notes (Signed)
   Medical records reviewed including progress notes, labs, and imaging. Patient is improving gradually. He remains high risk for further decline.  PMT will continue to follow peripherally and follow up with patient and family for palliative support and goals of care discussions on Monday 4/8. Please secure chat or call team line with imminent needs.  Thank you for your referral and allowing PMT to assist in [name]'s care.   Richardson Dopp, [facility] Palliative Medicine Team  Team Phone # [phone]   NO CHARGE

## 2022-12-03 DIAGNOSIS — J9601 Acute respiratory failure with hypoxia: Secondary | ICD-10-CM | POA: Diagnosis not present

## 2022-12-03 DIAGNOSIS — Z7189 Other specified counseling: Secondary | ICD-10-CM | POA: Diagnosis not present

## 2022-12-03 DIAGNOSIS — G9340 Encephalopathy, unspecified: Secondary | ICD-10-CM | POA: Diagnosis not present

## 2022-12-03 DIAGNOSIS — J189 Pneumonia, unspecified organism: Secondary | ICD-10-CM | POA: Diagnosis not present

## 2022-12-03 LAB — RENAL FUNCTION PANEL
Albumin: 1.9 g/dL — ABNORMAL LOW (ref 3.5–5.0)
Anion gap: 11 (ref 5–15)
BUN: 34 mg/dL — ABNORMAL HIGH (ref 8–23)
CO2: 25 mmol/L (ref 22–32)
Calcium: 10.1 mg/dL (ref 8.9–10.3)
Chloride: 96 mmol/L — ABNORMAL LOW (ref 98–111)
Creatinine, Ser: 6.96 mg/dL — ABNORMAL HIGH (ref 0.61–1.24)
GFR, Estimated: 8 mL/min — ABNORMAL LOW (ref >60)
Glucose, Bld: 71 mg/dL (ref 70–99)
Phosphorus: 2.6 mg/dL (ref 2.5–4.6)
Potassium: 4 mmol/L (ref 3.5–5.1)
Sodium: 132 mmol/L — ABNORMAL LOW (ref 135–145)

## 2022-12-03 LAB — CBC
HCT: 27 % — ABNORMAL LOW (ref 39.0–52.0)
Hemoglobin: 8.8 g/dL — ABNORMAL LOW (ref 13.0–17.0)
MCH: 30.3 pg (ref 26.0–34.0)
MCHC: 32.6 g/dL (ref 30.0–36.0)
MCV: 93.1 fL (ref 80.0–100.0)
Platelets: 223 10*3/uL (ref 150–400)
RBC: 2.9 MIL/uL — ABNORMAL LOW (ref 4.22–5.81)
RDW: 16.5 % — ABNORMAL HIGH (ref 11.5–15.5)
WBC: 9.5 10*3/uL (ref 4.0–10.5)
nRBC: 0.2 % (ref 0.0–0.2)

## 2022-12-03 MED ORDER — TRAZODONE HCL 50 MG PO TABS
50.0000 mg | ORAL_TABLET | Freq: Every day | ORAL | Status: DC
  Administered 2022-12-03 – 2022-12-04 (×2): 50 mg via ORAL
  Filled 2022-12-03 (×2): qty 1

## 2022-12-03 MED ORDER — LIDOCAINE HCL (PF) 1 % IJ SOLN: 5.0000 mL | INTRAMUSCULAR | Status: DC | PRN

## 2022-12-03 MED ORDER — LIDOCAINE-PRILOCAINE 2.5-2.5 % EX CREA: 1.0000 | TOPICAL_CREAM | CUTANEOUS | Status: DC | PRN

## 2022-12-03 MED ORDER — ANTICOAGULANT SODIUM CITRATE 4% (200MG/5ML) IV SOLN: 5.0000 mL | Status: DC | PRN

## 2022-12-03 MED ORDER — PENTAFLUOROPROP-TETRAFLUOROETH EX AERO: 1.0000 | INHALATION_SPRAY | CUTANEOUS | Status: DC | PRN

## 2022-12-03 MED ORDER — ALTEPLASE 2 MG IJ SOLR: 2.0000 mg | Freq: Once | INTRAMUSCULAR | Status: DC | PRN

## 2022-12-03 MED ORDER — PREGABALIN 25 MG PO CAPS
50.0000 mg | ORAL_CAPSULE | Freq: Every day | ORAL | Status: DC
  Administered 2022-12-04: 50 mg via ORAL
  Filled 2022-12-03 (×2): qty 2

## 2022-12-03 NOTE — Progress Notes (Signed)
Received patient in bed .Awake,alert and oriented x 2.Consent verified.Vitals were stable.  Access used : Right upper arm AVF that worked well.  Medicine given: Midodrine 10 mg.  Duration of treatment : 3.75 hours.  Fluid removed :  Achieved prescribed UF goal of 2 liters  Hemo issue: Tolerated treatment well.  Hand off to the patient's nurse.

## 2022-12-03 NOTE — Progress Notes (Signed)
PROGRESS NOTE    [name] Koenen  ZOX:[id number] DOB: 12/29/1960 DOA: 11/29/2022 PCP: Merri BrunetteSmith, Candace, MD   Chief Complaint  Patient presents with   Shortness of Breath    Brief Narrative:   [name] [place] is a 62 y.o. male with medical history significant of CHF status post heart transplant in 2014, on chronic immunosuppressant, ESRD on HD MWF, HTN, HLD, PAF on Eliquis, obesity, chronic on narcotics, anxiety/depression, recently diagnosed dementia, brought in by family member for evaluation of worsening of generalized weakness lethargy and confusion.   in ED, patient was found to be hypoxic stabilized on 7 L via high flow oxygen.  Blood pressure borderline low but according to patient's family his blood pressure SBP in the range of 80-90s.  Borderline tachycardia, afebrile.  Chest x-ray showed right lower lobe pneumonia.  Blood work showed WBC 9.8, creatinine 10.3 BUN 42, bicarb 26, K4.9.  Patient was started on cefepime and vancomycin   Assessment & Plan:   Principal Problem:   PNA (pneumonia) Active Problems:   Atrial fibrillation   Acute encephalopathy   AMS (altered mental status)   Aspiration pneumonia   Acute hypoxic respiratory failure -Secondary to aspiration pneumonia, likely from vomiting episode of vomiting on Sunday. -Aspiration precaution -Breathing treatment, DuoNeb every 6 hours, incentive spirometry and flutter valve. -Encouraged to use incentive spirometry and flutter valve. -He is on room air today.   Sepsis secondary to multifocal pneumonia Sepsis present on admission -Evidenced by tachypneic, and hypoxic with symptoms signs of endorgan damage of acute encephalopathy, and lactic acid of 2.8 -Given he is immunocompromise, he was kept on broad-spectrum IV antibiotics, now he appears to be improving, and he is MRSA PCR negative, stopped IV vancomycin.   -CT chest significant for extensive bibasilar opacities  Acute metabolic encephalopathy -Secondary to  sepsis -I have confirmed with wife, patient Lyrica dose is 50 mg oral daily, not 300 mg oral daily.   -Improving    History of heart transplant -Last seen by [facility] heart failure and transplant team in November 2023 and was considered to be stable.   -Continue current immune suppressant regimen including cyclosporine and mycophenolate   ESRD on HD MWF -Consulted, dialyzed overnight, he is on Monday Wednesday Friday schedule   IDDM -Sliding scale for now   PAF -Sinus rhythm, continue Eliquis   Hypotension -Family reported blood pressure appears to be at baseline, continue home regimen of midodrine   Hx of stroke -Stable, continue Eliquis      DVT prophylaxis: Eliquis Code Status: Full Family Communication: None at bedside, discussed with wife by phone 4/6, 4/8 Disposition:   Status is: Inpatient    Consultants:  Renal Palliative  Subjective:  No significant events overnight as discussed with staff, he denies any complaints today. Objective: Vitals:   12/03/22 1106 12/03/22 1131 12/03/22 1201 12/03/22 1234  BP: 105/70 118/71 113/75 119/77  Pulse: 96 85 (!) 103 100  Resp: 14 17 20 20   Temp:    98.2 F (36.8 C)  TempSrc:      SpO2: 99% 100% 100% 100%  Weight:    119.8 kg  Height:        Intake/Output Summary (Last 24 hours) at 12/03/2022 1400 Last data filed at 12/03/2022 1234 Gross per 24 hour  Intake 250 ml  Output 2000 ml  Net -1750 ml   Filed Weights   12/01/22 1300 12/03/22 0825 12/03/22 1234  Weight: 118.9 kg 121.9 kg 119.8 kg    Examination:  Patient appears more awake and oriented and conversant today mentation much improved, remains minimally confused.   frail, chronically ill-appearing, and deconditioned.. Symmetrical Chest wall movement, Good air movement bilaterally, CTAB RRR,No Gallops,Rubs or new Murmurs, No Parasternal Heave +ve B.Sounds, Abd Soft, No tenderness, No rebound - guarding or rigidity. No Cyanosis, Clubbing or edema, No new  Rash or bruise        Data Reviewed: I have personally reviewed following labs and imaging studies  CBC: Recent Labs  Lab 11/29/22 1019 11/29/22 1037 11/30/22 1423 12/01/22 0304 12/03/22 0408  WBC 9.8  --  9.2 9.7 9.5  NEUTROABS 8.1*  --   --   --   --   HGB 9.5* 10.2* 8.1* 9.4* 8.8*  HCT 28.3* 30.0* 24.8* 28.4* 27.0*  MCV 94.3  --  93.9 94.0 93.1  PLT 206  --  195 192 223    Basic Metabolic Panel: Recent Labs  Lab 11/29/22 1019 11/29/22 1037 11/30/22 1423 12/01/22 0304 12/03/22 0408  NA 137 133* 133* 133* 132*  K 4.9 4.7 4.0 4.0 4.0  CL 96*  --  97* 93* 96*  CO2 26  --  25 26 25   GLUCOSE 92  --  79 78 71  BUN 42*  --  32* 39* 34*  CREATININE 10.32*  --  7.14* 7.83* 6.96*  CALCIUM 10.7*  --  9.6 10.3 10.1  PHOS  --   --   --  3.5 2.6    GFR: Estimated Creatinine Clearance: 16 mL/min (A) (by C-G formula based on SCr of 6.96 mg/dL (H)).  Liver Function Tests: Recent Labs  Lab 11/29/22 1019 12/01/22 0304 12/03/22 0408  AST 14*  --   --   ALT 18  --   --   ALKPHOS 85  --   --   BILITOT 1.1  --   --   PROT 6.2*  --   --   ALBUMIN 2.4* 2.1* 1.9*    CBG: Recent Labs  Lab 11/29/22 1044  GLUCAP 75     Recent Results (from the past 240 hour(s))  Resp panel by RT-PCR (RSV, Flu A&B, Covid) Anterior Nasal Swab     Status: None   Collection Time: 11/29/22 10:27 AM   Specimen: Anterior Nasal Swab  Result Value Ref Range Status   SARS Coronavirus 2 by RT PCR NEGATIVE NEGATIVE Final   Influenza A by PCR NEGATIVE NEGATIVE Final   Influenza B by PCR NEGATIVE NEGATIVE Final    Comment: (NOTE) The Xpert Xpress SARS-CoV-2/FLU/RSV plus assay is intended as an aid in the diagnosis of influenza from Nasopharyngeal swab specimens and should not be used as a sole basis for treatment. Nasal washings and aspirates are unacceptable for Xpert Xpress SARS-CoV-2/FLU/RSV testing.  Fact Sheet for Patients: BloggerCourse.com  Fact Sheet for  Healthcare Providers: SeriousBroker.it  This test is not yet approved or cleared by the [place] FDA and has been authorized for detection and/or diagnosis of SARS-CoV-2 by FDA under an Emergency Use Authorization (EUA). This EUA will remain in effect (meaning this test can be used) for the duration of the COVID-19 declaration under Section 564(b)(1) of the Act, 21 U.S.C. section 360bbb-3(b)(1), unless the authorization is terminated or revoked.     Resp Syncytial Virus by PCR NEGATIVE NEGATIVE Final    Comment: (NOTE) Fact Sheet for Patients: BloggerCourse.com  Fact Sheet for Healthcare Providers: SeriousBroker.it  This test is not yet approved or cleared by the [place] and  has been authorized for detection and/or diagnosis of SARS-CoV-2 by FDA under an Emergency Use Authorization (EUA). This EUA will remain in effect (meaning this test can be used) for the duration of the COVID-19 declaration under Section 564(b)(1) of the Act, 21 U.S.C. section 360bbb-3(b)(1), unless the authorization is terminated or revoked.  Performed at [facility], [address], [place], [place] [address]   Blood Culture (routine x 2)     Status: None (Preliminary result)   Collection Time: 11/29/22 10:27 AM   Specimen: BLOOD LEFT ARM  Result Value Ref Range Status   Specimen Description BLOOD LEFT ARM  Final   Special Requests   Final    BOTTLES DRAWN AEROBIC AND ANAEROBIC Blood Culture results may not be optimal due to an excessive volume of blood received in culture bottles   Culture   Final    NO GROWTH 4 DAYS Performed at [facility], [address] [place], [place] [address]    Report Status PENDING  Incomplete  MRSA Next Gen by PCR, Nasal     Status: None   Collection Time: 11/29/22 10:41 AM   Specimen: Nasal Mucosa; Nasal Swab  Result Value Ref Range Status   MRSA by PCR Next Gen NOT  DETECTED NOT DETECTED Final    Comment: (NOTE) The GeneXpert MRSA Assay (FDA approved for NASAL specimens only), is one component of a comprehensive MRSA colonization surveillance program. It is not intended to diagnose MRSA infection nor to guide or monitor treatment for MRSA infections. Test performance is not FDA approved in patients less than 12 years old. Performed at [facility], [address] [place], [place] [address]   Blood Culture (routine x 2)     Status: None (Preliminary result)   Collection Time: 11/29/22  6:30 PM   Specimen: BLOOD  Result Value Ref Range Status   Specimen Description BLOOD SITE NOT SPECIFIED  Final   Special Requests   Final    BOTTLES DRAWN AEROBIC AND ANAEROBIC Blood Culture adequate volume   Culture   Final    NO GROWTH 4 DAYS Performed at [facility], [address] [place], [place] [address]    Report Status PENDING  Incomplete         Radiology Studies: No results found.      Scheduled Meds:  acetaminophen  650 mg Oral Q6H   apixaban  2.5 mg Oral BID   Chlorhexidine Gluconate Cloth  6 each Topical Q0600   cycloSPORINE modified  100 mg Oral q AM   And   cycloSPORINE modified  75 mg Oral QHS   divalproex  250 mg Oral Daily   divalproex  500 mg Oral QHS   DULoxetine  60 mg Oral Daily   feeding supplement (NEPRO CARB STEADY)  237 mL Oral BID BM   guaiFENesin  1,200 mg Oral BID   ipratropium-albuterol  3 mL Nebulization BID   midodrine  10 mg Oral TID WC   multivitamin  1 tablet Oral QHS   mycophenolate  720 mg Oral BID   pregabalin  75 mg Oral Daily   rosuvastatin  10 mg Oral Daily   sevelamer carbonate  1,600 mg Oral TID WC   Continuous Infusions:  anticoagulant sodium citrate     ceFEPime (MAXIPIME) IV 1 g (12/02/22 0812)     LOS: 4 days     Huey Bienenstock, MD [facility]   To contact the attending provider  between 7A-7P or the covering provider during after hours 7P-7A, please log into  the web site [email or web address] and access using universal [place] password for that web site. If you do not have the password, please call the hospital operator.  12/03/2022, 2:00 PM

## 2022-12-03 NOTE — Progress Notes (Signed)
Daily Progress Note   Patient Name: [name]       Date: 12/03/2022 DOB: 1961-01-02  Age: 62 y.o. MRN#: [id number] Attending Physician: Starleen Arms, MD Primary Care Physician: Merri Brunette, MD Admit Date: 11/29/2022  Reason for Consultation/Follow-up: Establishing goals of care  Subjective: Medical records reviewed including progress notes and labs.  Patient assessed at the bedside.  He is alert, denies pain or distress.  He does not remember seeing me before.  No family present during my visit.  I then called [name] for palliative support and ongoing goals of care discussions.  Offered to discuss interval history since our last conversation and schedule another meeting.  She is quite exhausted today. She tells me that she remains satisfied with her last conversation with patient's attending.  Patient is progressing as she expected.  [name] remains open to ongoing goals of care discussions, though she feels this might be more helpful closer to anticipated discharge.  Discussed coordinating with primary attending to solidify when this might be and schedule another meeting accordingly.  She is appreciative.  I also informed her I will be off service for the next 2 days and that I would ask my colleague to continue checking in.  Questions and concerns addressed. PMT will continue to support holistically.   Length of Stay: 4  Physical Exam Vitals reviewed.  Constitutional:      General: He is not in acute distress.    Appearance: He is ill-appearing.  Cardiovascular:     Rate and Rhythm: Normal rate.  Pulmonary:     Effort: Pulmonary effort is normal.  Neurological:     Mental Status: He is alert.  Psychiatric:        Mood and Affect: Mood normal.         Behavior: Behavior normal.            Vital Signs: BP 104/64   Pulse 86   Temp 98.5 F (36.9 C) (Oral)   Resp 17   Ht 6\' 6"  (1.981 m)   Wt 121.9 kg   SpO2 100%   BMI 31.06 kg/m  SpO2: SpO2: 100 % O2 Device: O2 Device: Room Air O2 Flow Rate: O2 Flow Rate (L/min): 2 L/min      Palliative Assessment/Data: 30%    Palliative  Care Assessment & Plan   Patient Profile: 62 y.o. male  with past medical history of CHF status post heart transplant in 2014, on chronic immunosuppressant, ESRD on HD MWF, HTN, HLD, PAF on Eliquis, chronically on narcotics, anxiety/depression, recently diagnosed dementia  admitted on 11/29/2022 with lethargy and confusion, as well as generalized weakness.    Patient has been admitted for sepsis and acute hypoxic respiratory failure secondary to aspiration pneumonia. PMT has been consulted to assist with goals of care conversation.  Assessment: End-stage renal disease on dialysis Acute hypoxic respiratory failure secondary to aspiration pneumonia Acute metabolic encephalopathy Goals of care conversation  Recommendations/Plan: Continue full code/full scope treatment Anticipate that patient will be ready to discharge later this week, will continue ongoing goals of care discussions  PMT will continue to follow and support   Prognosis: Guarded  Discharge Planning: To Be Determined  Care plan was discussed with patient, patient's wife, Dr. Randol Kern   Total time: I spent 35 minutes in the care of the patient today in the above activities and documenting the encounter.   Emillie Chasen Jeni Salles, PA-C  Palliative Medicine Team Team phone # [phone]  Thank you for allowing the Palliative Medicine Team to assist in the care of this patient. Please utilize secure chat with additional questions, if there is no response within 30 minutes please call the above phone number.  Palliative Medicine Team providers are available by phone from 7am to 7pm daily and  can be reached through the team cell phone.  Should this patient require assistance outside of these hours, please call the patient's attending physician.

## 2022-12-03 NOTE — Procedures (Signed)
I was present at this dialysis session. I have reviewed the session itself and made appropriate changes.   On RA, Awake but drifts off t osleep  K 4.0 on 3K bath.  UF goal of 2L.  No c/ol  Using AVF R FA.    Filed Weights   12/01/22 0816 12/01/22 1300 12/03/22 0825  Weight: 120.9 kg 118.9 kg 121.9 kg    Recent Labs  Lab 12/03/22 0408  NA 132*  K 4.0  CL 96*  CO2 25  GLUCOSE 71  BUN 34*  CREATININE 6.96*  CALCIUM 10.1  PHOS 2.6    Recent Labs  Lab 11/29/22 1019 11/29/22 1037 11/30/22 1423 12/01/22 0304 12/03/22 0408  WBC 9.8  --  9.2 9.7 9.5  NEUTROABS 8.1*  --   --   --   --   HGB 9.5*   < > 8.1* 9.4* 8.8*  HCT 28.3*   < > 24.8* 28.4* 27.0*  MCV 94.3  --  93.9 94.0 93.1  PLT 206  --  195 192 223   < > = values in this interval not displayed.    Scheduled Meds:  acetaminophen  650 mg Oral Q6H   apixaban  2.5 mg Oral BID   Chlorhexidine Gluconate Cloth  6 each Topical Q0600   cycloSPORINE modified  100 mg Oral q AM   And   cycloSPORINE modified  75 mg Oral QHS   divalproex  250 mg Oral Daily   divalproex  500 mg Oral QHS   DULoxetine  60 mg Oral Daily   feeding supplement (NEPRO CARB STEADY)  237 mL Oral BID BM   guaiFENesin  1,200 mg Oral BID   ipratropium-albuterol  3 mL Nebulization BID   midodrine  10 mg Oral TID WC   multivitamin  1 tablet Oral QHS   mycophenolate  720 mg Oral BID   pregabalin  75 mg Oral Daily   rosuvastatin  10 mg Oral Daily   sevelamer carbonate  1,600 mg Oral TID WC   Continuous Infusions:  anticoagulant sodium citrate     ceFEPime (MAXIPIME) IV 1 g (12/02/22 0812)   PRN Meds:.alteplase, anticoagulant sodium citrate, lidocaine (PF), lidocaine-prilocaine, methocarbamol, ondansetron **OR** ondansetron (ZOFRAN) IV, mouth rinse, oxybutynin, oxyCODONE, pentafluoroprop-tetrafluoroeth, senna-docusate, traZODone   Sabra Heck  MD 12/03/2022, 8:52 AM

## 2022-12-03 NOTE — Progress Notes (Signed)
Pt receives out-pt HD at [facility] on MWF. Will assist as needed.   Sollie Vultaggio Renal Navigator [phone] 

## 2022-12-03 NOTE — Plan of Care (Signed)

## 2022-12-04 ENCOUNTER — Ambulatory Visit: Payer: Medicare Other | Admitting: Physical Therapy

## 2022-12-04 DIAGNOSIS — Z515 Encounter for palliative care: Secondary | ICD-10-CM | POA: Diagnosis not present

## 2022-12-04 DIAGNOSIS — Z7189 Other specified counseling: Secondary | ICD-10-CM | POA: Diagnosis not present

## 2022-12-04 DIAGNOSIS — J9601 Acute respiratory failure with hypoxia: Secondary | ICD-10-CM | POA: Diagnosis not present

## 2022-12-04 DIAGNOSIS — Z789 Other specified health status: Secondary | ICD-10-CM

## 2022-12-04 DIAGNOSIS — J189 Pneumonia, unspecified organism: Secondary | ICD-10-CM | POA: Diagnosis not present

## 2022-12-04 DIAGNOSIS — Z711 Person with feared health complaint in whom no diagnosis is made: Secondary | ICD-10-CM

## 2022-12-04 LAB — CULTURE, BLOOD (ROUTINE X 2)
Culture: NO GROWTH
Culture: NO GROWTH
Special Requests: ADEQUATE

## 2022-12-04 MED ORDER — IPRATROPIUM-ALBUTEROL 0.5-2.5 (3) MG/3ML IN SOLN: 3.0000 mL | Freq: Four times a day (QID) | RESPIRATORY_TRACT | Status: DC | PRN

## 2022-12-04 MED ORDER — CINACALCET HCL 30 MG PO TABS
120.0000 mg | ORAL_TABLET | Freq: Every day | ORAL | Status: DC
  Administered 2022-12-04 – 2022-12-05 (×2): 120 mg via ORAL
  Filled 2022-12-04 (×2): qty 4

## 2022-12-04 NOTE — Care Management Important Message (Signed)
Important Message  Patient Details  Name: [name] MRN: [id number] Date of Birth: [birth year]   Medicare Important Message Given:  Yes     [name] 12/04/2022, 2:29 PM

## 2022-12-04 NOTE — Progress Notes (Signed)
CSW added contact info for [facility] transportation on patient's AVS.  Joaquin Courts, MSW, [facility]

## 2022-12-04 NOTE — Progress Notes (Signed)
Daily Progress Note   Patient Name: [name]       Date: 12/04/2022 DOB: May 20, 1961  Age: 62 y.o. MRN#: [id number] Attending Physician: Starleen Arms, MD Primary Care Physician: Merri Brunette, MD Admit Date: 11/29/2022  Reason for Consultation/Follow-up: Establishing goals of care  Subjective: I have reviewed medical records including EPIC notes, MAR, and labs. Received report from primary RN - no acute concerns.   Went to visit patient at bedside - no family/visitors present. Patient was lying in bed. No signs or non-verbal gestures of pain or discomfort noted. No respiratory distress, increased work of breathing, or secretions noted.   Called wife/Gwen - emotional support provided. Discussed option of scheduling meeting - she prefers to speak over the phone today. Therapeutic listening provided as she reflects on previous discussions with providers/care team as well as goals of care. At this time, goal is for patient's discharge tomorrow after completion of IV antibiotics and HD. Goals after discharge is for patient to continue HD and therapy services at his previous outpatient center. Gwen expresses that her biggest concern is finding transport for patient to/from HD and other appointments as they arise. Therapeutic listening provided as she outlines how patient has functionally declined and has "behaviors concerning how to go forward." She expresses that his decline has made it harder for family (herself and her daughter) to manage his care as they both work. Patient was able to drive himself to HD, but can no longer do this; he also can only walk short distances. She tells me he is "almost house bound." She is hopeful to find transportation services that can help with their needs - she is  open to discussion options further with TOC. She felt patient was finding benefit at the outpatient PT center and she is hopeful to be able to continue this soon after his discharge.   Therapeutic listening provided as she expressed they were hopeful patient may be able to get a kidney transplant; however, she is "realistic it won't happen."  Outpatient Palliative Care explained and offered - she is accepting and appreciative.   Gwen wonders if there are any counseling/emotional support services patient may be able to utilize - discussed that outpatient Palliative Care may have services within their organization; however, if not, recommend she discuss with her PCP for outpatient  recommendations.  All questions and concerns addressed. Encouraged to call with questions and/or concerns. PMT card provided.  Length of Stay: 5  Current Medications: Scheduled Meds:   acetaminophen  650 mg Oral Q6H   apixaban  2.5 mg Oral BID   Chlorhexidine Gluconate Cloth  6 each Topical Q0600   cinacalcet  120 mg Oral Q supper   cycloSPORINE modified  100 mg Oral q AM   And   cycloSPORINE modified  75 mg Oral QHS   divalproex  250 mg Oral Daily   divalproex  500 mg Oral QHS   DULoxetine  60 mg Oral Daily   feeding supplement (NEPRO CARB STEADY)  237 mL Oral BID BM   guaiFENesin  1,200 mg Oral BID   midodrine  10 mg Oral TID WC   multivitamin  1 tablet Oral QHS   mycophenolate  720 mg Oral BID   pregabalin  50 mg Oral Daily   rosuvastatin  10 mg Oral Daily   sevelamer carbonate  1,600 mg Oral TID WC   traZODone  50 mg Oral QHS    Continuous Infusions:  ceFEPime (MAXIPIME) IV 200 mL/hr at 12/04/22 0900    PRN Meds: ipratropium-albuterol, methocarbamol, ondansetron **OR** ondansetron (ZOFRAN) IV, mouth rinse, oxybutynin, oxyCODONE, senna-docusate  Physical Exam Vitals and nursing note reviewed.  Constitutional:      General: He is not in acute distress.    Appearance: He is ill-appearing.   Pulmonary:     Effort: No respiratory distress.  Skin:    General: Skin is warm and dry.  Neurological:     Motor: Weakness present.             Vital Signs: BP 120/73   Pulse 85   Temp 98.1 F (36.7 C) (Oral)   Resp 17   Ht 6\' 6"  (1.981 m)   Wt 119.8 kg   SpO2 98%   BMI 30.52 kg/m  SpO2: SpO2: 98 % O2 Device: O2 Device: Room Air O2 Flow Rate: O2 Flow Rate (L/min): 2 L/min  Intake/output summary:  Intake/Output Summary (Last 24 hours) at 12/04/2022 1149 Last data filed at 12/04/2022 0900 Gross per 24 hour  Intake 374.06 ml  Output 2000 ml  Net -1625.94 ml   LBM:   Baseline Weight: Weight: (!) 140 kg Most recent weight: Weight: 119.8 kg       Palliative Assessment/Data: PPS 30%      Patient Active Problem List   Diagnosis Date Noted   AMS (altered mental status) 11/29/2022   Aspiration pneumonia 11/29/2022   PNA (pneumonia) 11/29/2022   Peripheral autonomic neuropathy due to DM 11/01/2022   Orthostatic hypotension 11/01/2022   Acute encephalopathy 07/04/2022   Chronic kidney disease, stage 5 11/14/2021   Gait abnormality 10/12/2021   Polyneuropathy associated with underlying disease 10/12/2021   Neuropathic pain 10/12/2021   Morbid obesity 11/19/2019   Sleep disorder 10/22/2019   Sleep disturbance 10/22/2019   Abnormality of gait 06/09/2019   Diabetic polyneuropathy associated with diabetes mellitus due to underlying condition 04/14/2019   Peripheral neuropathy 12/11/2018   CVA (cerebral vascular accident) 11/09/2018   H/O heart transplant 11/09/2018   Malignant neoplasm of prostate 05/06/2018   Closed posterior dislocation of hip, right, initial encounter 08/24/2017   End stage renal disease on dialysis 08/24/2017   Hip dislocation, right 08/24/2017   Osteoarthritis of right hip 07/22/2017   VENTRICULAR TACHYCARDIA 12/21/2009   Dyslipidemia 08/30/2009   Essential hypertension 08/30/2009   ISCHEMIC CARDIOMYOPATHY 08/30/2009  Atrial fibrillation  08/30/2009   VENTRICULAR ECTOPY 08/30/2009   History of CVA (cerebrovascular accident) 08/30/2009   Obesity, Class III, BMI 40-49.9 (morbid obesity) 08/30/2009   DM type 2 with diabetic peripheral neuropathy 10/17/2007   OBSTRUCTIVE SLEEP APNEA 10/17/2007   CARDIOMYOPATHY, DILATED 10/17/2007    Palliative Care Assessment & Plan   Patient Profile: 62 y.o. male  with past medical history of CHF status post heart transplant in 2014, on chronic immunosuppressant, ESRD on HD MWF, HTN, HLD, PAF on Eliquis, chronically on narcotics, anxiety/depression, recently diagnosed dementia  admitted on 11/29/2022 with lethargy and confusion, as well as generalized weakness.    Patient has been admitted for sepsis and acute hypoxic respiratory failure secondary to aspiration pneumonia. PMT has been consulted to assist with goals of care conversation.  Assessment: Principal Problem:   PNA (pneumonia) Active Problems:   Atrial fibrillation   Acute encephalopathy   AMS (altered mental status)   Aspiration pneumonia   Concern about end of life  Recommendations/Plan: Continue full code/full scope care Family's goals are for patient's discharge home to continue HD and outpatient PT therapies TOC notified and consulted for: outpatient Palliative Care referral as well as wife's request for transportation services for patient to/from HD and other appointments  PMT will continue to follow and support holistically  Goals of Care and Additional Recommendations: Limitations on Scope of Treatment: Full Scope Treatment  Code Status:    Code Status Orders  (From admission, onward)           Start     Ordered   11/29/22 1431  Full code  Continuous       Question:  By:  Answer:  Consent: discussion documented in EHR   11/29/22 1432           Code Status History     Date Active Date Inactive Code Status Order ID Comments User Context   07/04/2022 1910 07/06/2022 2131 Full Code [id number]  Synetta Fail, MD ED   11/09/2018 1201 11/11/2018 1437 DNR [id number]  Jonah Blue, MD ED   08/24/2017 2055 08/25/2017 1330 Full Code [id number]  Yolonda Kida, MD Inpatient   07/22/2017 1340 07/24/2017 2118 Full Code [id number]  Samson Frederic, MD Inpatient       Prognosis:  Guarded to poor  Discharge Planning: Home with Palliative Services  Care plan was discussed with primary RN, patient's wife, TOC, Dr. Randol Kern  Thank you for allowing the Palliative Medicine Team to assist in the care of this patient.  Haskel Khan, NP  Please contact Palliative Medicine Team phone at [phone] for questions and concerns.   *Portions of this note are a verbal dictation therefore any spelling and/or grammatical errors are due to the "Dragon [employer] One" system interpretation.

## 2022-12-04 NOTE — Progress Notes (Signed)
Palliative Care notes reviewed. Contacted [facility] clinic social worker to inquire if pt has had any applications submitted for transportation assistance in the past. Clinic social worker advised navigator that pt is active with [facility] and wife can call to schedule transportation appts to/from HD. This info was provided to RN CM and CSW to assist with pt's d/c planning when communicating with pt's wife regarding d/c plans. Will assist as needed.   Olivia Canter Renal Navigator [phone]

## 2022-12-04 NOTE — Plan of Care (Signed)

## 2022-12-04 NOTE — Progress Notes (Signed)
Physical Therapy Treatment Patient Details Name: [name] MRN: [id number] DOB: [birth year] Today's Date: 12/04/2022   History of Present Illness 62 y/o M admitted to [facility] on 4/4 for SOB and feeling tired with lethargy and confusion. Chest x-ray showing right lower lobe pneumonia, head CT without acute intracranial abnormality. PMHx: CHF s/p heart transplant in 2014, chronic immunosuppressant, ESRD on HD MWF, HTN, HLD, PAF on Eliquis, anxiety/depression, dementia.    PT Comments    Pt tolerated today's session well requiring minA to stand with minAx2 for pivot to [facility]. Pt requiring mild encouragement for participation once EOB, reporting initially he couldn't stand then progressed to he couldn't walk. Pt able to stand well with 1HHA, found walker for next attempt but pt requiring use of BSC and requesting therapy to leave for privacy. With first stand, pt able to side stepx1 trial towards [facility] and reports working on ambulation during OPPT. Acute PT will continue to follow to progress OOB mobility, discharge recommendations remain appropriate as pt continues to report care from his family.     Recommendations for follow up therapy are one component of a multi-disciplinary discharge planning process, led by the attending physician.  Recommendations may be updated based on patient status, additional functional criteria and insurance authorization.  Follow Up Recommendations       Assistance Recommended at Discharge Frequent or constant Supervision/Assistance  Patient can return home with the following A lot of help with walking and/or transfers;Assist for transportation   Equipment Recommendations  None recommended by PT    Recommendations for Other Services       Precautions / Restrictions Precautions Precautions: Fall;Other (comment) Precaution Comments: watch O2 Restrictions Weight Bearing Restrictions: No     Mobility  Bed Mobility Overal bed mobility: Needs Assistance Bed  Mobility: Supine to Sit     Supine to sit: Min guard, HOB elevated     General bed mobility comments: increased time with use of bedrail and mild cueing for technique    Transfers Overall transfer level: Needs assistance Equipment used: 1 person hand held assist Transfers: Sit to/from Stand, Bed to chair/wheelchair/BSC Sit to Stand: Min assist, From elevated surface Stand pivot transfers: From elevated surface, Min assist, +2 physical assistance         General transfer comment: minA for power-up and steady from elevated bed, cueing for posture and upright tolerance, able to side step x1 towards HOB with 1HHA. Stand pivot to [facility] with minAx2 and mild cueing for technique as pt attempting to sit prior to being square with the [facility]    Ambulation/Gait               General Gait Details: pt side stepping x1 towards [facility], deferred further attempt as pt needing to use [facility] and requesting to leave for privacy   Stairs             Wheelchair Mobility    Modified Rankin (Stroke Patients Only)       Balance Overall balance assessment: Needs assistance Sitting-balance support: Bilateral upper extremity supported, Feet supported Sitting balance-Leahy Scale: Fair     Standing balance support: Single extremity supported, During functional activity Standing balance-Leahy Scale: Poor Standing balance comment: reliant on UE support and assist for pivoting, standing statically with 1UE support                            Cognition Arousal/Alertness: Awake/alert Behavior During Therapy: WFL for tasks  assessed/performed Overall Cognitive Status: No family/caregiver present to determine baseline cognitive functioning                                 General Comments: pt continues to follow commands with increased time and processing        Exercises      General Comments General comments (skin integrity, edema, etc.): VSS on room air, nurse tech  present to assist with transfer to [facility]      Pertinent Vitals/Pain Pain Assessment Pain Assessment: Faces Faces Pain Scale: No hurt    Home Living                          Prior Function            PT Goals (current goals can now be found in the care plan section) Acute Rehab PT Goals Patient Stated Goal: go home and continue OPPT PT Goal Formulation: With patient Time For Goal Achievement: 12/15/22 Potential to Achieve Goals: Fair Progress towards PT goals: Progressing toward goals    Frequency    Min 3X/week      PT Plan Current plan remains appropriate    Co-evaluation              AM-PAC PT "6 Clicks" Mobility   Outcome Measure  Help needed turning from your back to your side while in a flat bed without using bedrails?: A Little Help needed moving from lying on your back to sitting on the side of a flat bed without using bedrails?: A Little Help needed moving to and from a bed to a chair (including a wheelchair)?: A Lot Help needed standing up from a chair using your arms (e.g., wheelchair or bedside chair)?: A Little Help needed to walk in hospital room?: Total Help needed climbing 3-5 steps with a railing? : Total 6 Click Score: 13    End of Session Equipment Utilized During Treatment: Gait belt Activity Tolerance: Patient tolerated treatment well Patient left: with call bell/phone within reach;with nursing/sitter in room;Other (comment) (on [facility] with nurse tech present) Nurse Communication: Mobility status PT Visit Diagnosis: Muscle weakness (generalized) (M62.81);Other abnormalities of gait and mobility (R26.89);Unsteadiness on feet (R26.81)     Time: 1205-1221 PT Time Calculation (min) (ACUTE ONLY): 16 min  Charges:  $Therapeutic Activity: 8-22 mins                     Lindalou Hose, PT DPT Acute Rehabilitation Services Office [phone]    Leonie Man 12/04/2022, 3:07 PM

## 2022-12-04 NOTE — Progress Notes (Signed)
[facility] Progress Note   Subjective:  Seen in room - he was insistent that was being discharged today -> clarified plan with notes and RN - will be discharge TOMORROW after dialysis and last dose of abx. He calmed down after. Denies CP/dyspnea.  Objective Vitals:   12/04/22 0030 12/04/22 0432 12/04/22 0756 12/04/22 0855  BP: 135/76 107/68 120/73   Pulse: 87 100 90 85  Resp: 14 16 19 17   Temp: 97.6 F (36.4 C) 97.8 F (36.6 C) 98.1 F (36.7 C)   TempSrc: Oral Oral Oral   SpO2: 99% 98% 97% 98%  Weight:      Height:       Physical Exam General: Well appearing, albeit restless man, NAD. Heart: RRR; no murmur Lungs: CTAB; no rales or wheezing Abdomen: soft Extremities: no LE edema Dialysis Access:  R AVF + bruit  Additional Objective Labs: Basic Metabolic Panel: Recent Labs  Lab 11/30/22 1423 12/01/22 0304 12/03/22 0408  NA 133* 133* 132*  K 4.0 4.0 4.0  CL 97* 93* 96*  CO2 25 26 25   GLUCOSE 79 78 71  BUN 32* 39* 34*  CREATININE 7.14* 7.83* 6.96*  CALCIUM 9.6 10.3 10.1  PHOS  --  3.5 2.6   Liver Function Tests: Recent Labs  Lab 11/29/22 1019 12/01/22 0304 12/03/22 0408  AST 14*  --   --   ALT 18  --   --   ALKPHOS 85  --   --   BILITOT 1.1  --   --   PROT 6.2*  --   --   ALBUMIN 2.4* 2.1* 1.9*   CBC: Recent Labs  Lab 11/29/22 1019 11/29/22 1037 11/30/22 1423 12/01/22 0304 12/03/22 0408  WBC 9.8  --  9.2 9.7 9.5  NEUTROABS 8.1*  --   --   --   --   HGB 9.5*   < > 8.1* 9.4* 8.8*  HCT 28.3*   < > 24.8* 28.4* 27.0*  MCV 94.3  --  93.9 94.0 93.1  PLT 206  --  195 192 223   < > = values in this interval not displayed.   Medications:  ceFEPime (MAXIPIME) IV 200 mL/hr at 12/04/22 0900    acetaminophen  650 mg Oral Q6H   apixaban  2.5 mg Oral BID   Chlorhexidine Gluconate Cloth  6 each Topical Q0600   cycloSPORINE modified  100 mg Oral q AM   And   cycloSPORINE modified  75 mg Oral QHS   divalproex  250 mg Oral Daily    divalproex  500 mg Oral QHS   DULoxetine  60 mg Oral Daily   feeding supplement (NEPRO CARB STEADY)  237 mL Oral BID BM   guaiFENesin  1,200 mg Oral BID   midodrine  10 mg Oral TID WC   multivitamin  1 tablet Oral QHS   mycophenolate  720 mg Oral BID   pregabalin  50 mg Oral Daily   rosuvastatin  10 mg Oral Daily   sevelamer carbonate  1,600 mg Oral TID WC   traZODone  50 mg Oral QHS    Dialysis Orders: [facility]  4h  500/2.0   120kg  3K/2Ca bath  AVF Hep none - last HD 4/01, post wt 120.3kg  - mircera 120mg  IV q 4 wks, last 3/29, due 4/26 - no vdra   Assessment/Plan: AHRF + pneumonia: Improving, will finish course of IV Cefepime tomorrow. Tremors/dementia: Ongoing decline of MS over last 6  mos per family.  F/b [place] [facility].  ESRD: Continue HD on usual MWF schedule - HD tomorrow. HypoTN/ volume: On mido 10mg  TID, edema resolved. Slightly under prior EDW. Anemia of ESRD: Hgb 8.8 - restart ESA on discharge. Secondary HPTH: CorrCa high, not on VDRA. Continue Renvela as binder. Looks like takes sensipar as outpatient and not getting here -> will resume. Hx heart transplant - takes CyA and myfortic  Hx CVA Hx prostate cancer Atrial fib IDDM Hx charcot foot   Ozzie Hoyle, PA-C 12/04/2022, 9:57 AM  [employer]

## 2022-12-04 NOTE — Progress Notes (Signed)
PROGRESS NOTE    [name]  ZOX:[id number] DOB: 01/21/1961 DOA: 11/29/2022 PCP: Merri BrunetteSmith, Candace, MD   Chief Complaint  Patient presents with   Shortness of Breath    Brief Narrative:   [name] is a 62 y.o. male with medical history significant of CHF status post heart transplant in 2014, on chronic immunosuppressant, ESRD on HD MWF, HTN, HLD, PAF on Eliquis, obesity, chronic on narcotics, anxiety/depression, recently diagnosed dementia, brought in by family member for evaluation of worsening of generalized weakness lethargy and confusion.   in ED, patient was found to be hypoxic stabilized on 7 L via high flow oxygen.  Blood pressure borderline low but according to patient's family his blood pressure SBP in the range of 80-90s.  Borderline tachycardia, afebrile.  Chest x-ray showed right lower lobe pneumonia.  Blood work showed WBC 9.8, creatinine 10.3 BUN 42, bicarb 26, K4.9.  Patient was started on cefepime and vancomycin, respiratory status much improved, back on room air, lytic medicine has been consulted given patient continues to decline over the last few months as an outpatient.   Assessment & Plan:   Principal Problem:   PNA (pneumonia) Active Problems:   Atrial fibrillation   Acute encephalopathy   AMS (altered mental status)   Aspiration pneumonia   Acute hypoxic respiratory failure -Secondary to aspiration pneumonia, likely from vomiting episode of vomiting on Sunday. -Aspiration precaution -Breathing treatment, DuoNeb every 6 hours, incentive spirometry and flutter valve. -Encouraged to use incentive spirometry and flutter valve. -He is on room air today.   Sepsis secondary to multifocal pneumonia Sepsis present on admission -Evidenced by tachypneic, and hypoxic with symptoms signs of endorgan damage of acute encephalopathy, and lactic acid of 2.8 -Given he is immunocompromise, he was kept on broad-spectrum IV antibiotics, now he appears to be improving, and  he is MRSA PCR negative, stopped IV vancomycin.  Will finish 5 days of IV antibiotics tomorrow, no further need for antibiotic at time of discharge. -CT chest significant for extensive bibasilar opacities  Acute metabolic encephalopathy -Secondary to sepsis -I have confirmed with wife, patient Lyrica dose is 50 mg oral daily, not 300 mg oral daily.   -Improving    History of heart transplant -Last seen by [facility] heart failure and transplant team in November 2023 and was considered to be stable.   -Continue current immune suppressant regimen including cyclosporine and mycophenolate   ESRD on HD MWF -Consulted, dialyzed overnight, he is on Monday Wednesday Friday schedule   IDDM -Sliding scale for now   PAF -Sinus rhythm, continue Eliquis   Hypotension -Family reported blood pressure appears to be at baseline, continue home regimen of midodrine   Hx of stroke -Stable, continue Eliquis      DVT prophylaxis: Eliquis Code Status: Full Family Communication: None at bedside, discussed with wife by phone 4/6, 4/8 Disposition:   Status is: Inpatient    Consultants:  Renal Palliative  Subjective:  No significant events overnight as discussed with staff, he denies any complaints today. Objective: Vitals:   12/03/22 1946 12/04/22 0030 12/04/22 0432 12/04/22 0756  BP:  135/76 107/68 120/73  Pulse: 100 87 100 90  Resp: 18 14 16 19   Temp:  97.6 F (36.4 C) 97.8 F (36.6 C) 98.1 F (36.7 C)  TempSrc:  Oral Oral Oral  SpO2: 99% 99% 98% 97%  Weight:      Height:        Intake/Output Summary (Last 24 hours) at 12/04/2022 [phone]  Last data filed at 12/03/2022 1234 Gross per 24 hour  Intake --  Output 2000 ml  Net -2000 ml   Filed Weights   12/01/22 1300 12/03/22 0825 12/03/22 1234  Weight: 118.9 kg 121.9 kg 119.8 kg    Examination:    Awake Alert, he is more coherent and appropriate today, answering questions appropriately, but he remains mildly  confused.frail. Symmetrical Chest wall movement, Good air movement bilaterally, CTAB RRR,No Gallops,Rubs or new Murmurs, No Parasternal Heave +ve B.Sounds, Abd Soft, No tenderness, No rebound - guarding or rigidity. No Cyanosis, Clubbing or edema, No new Rash or bruise       Data Reviewed: I have personally reviewed following labs and imaging studies  CBC: Recent Labs  Lab 11/29/22 1019 11/29/22 1037 11/30/22 1423 12/01/22 0304 12/03/22 0408  WBC 9.8  --  9.2 9.7 9.5  NEUTROABS 8.1*  --   --   --   --   HGB 9.5* 10.2* 8.1* 9.4* 8.8*  HCT 28.3* 30.0* 24.8* 28.4* 27.0*  MCV 94.3  --  93.9 94.0 93.1  PLT 206  --  195 192 223    Basic Metabolic Panel: Recent Labs  Lab 11/29/22 1019 11/29/22 1037 11/30/22 1423 12/01/22 0304 12/03/22 0408  NA 137 133* 133* 133* 132*  K 4.9 4.7 4.0 4.0 4.0  CL 96*  --  97* 93* 96*  CO2 26  --  GLUCOSE 92  --  79 78 71  BUN 42*  --  32* 39* 34*  CREATININE 10.32*  --  7.14* 7.83* 6.96*  CALCIUM 10.7*  --  9.6 10.3 10.1  PHOS  --   --   --  3.5 2.6    GFR: Estimated Creatinine Clearance: 16 mL/min (A) (by C-G formula based on SCr of 6.96 mg/dL (H)).  Liver Function Tests: Recent Labs  Lab 11/29/22 1019 12/01/22 0304 12/03/22 0408  AST 14*  --   --   ALT 18  --   --   ALKPHOS 85  --   --   BILITOT 1.1  --   --   PROT 6.2*  --   --   ALBUMIN 2.4* 2.1* 1.9*    CBG: Recent Labs  Lab 11/29/22 1044  GLUCAP 75     Recent Results (from the past 240 hour(s))  Resp panel by RT-PCR (RSV, Flu A&B, Covid) Anterior Nasal Swab     Status: None   Collection Time: 11/29/22 10:27 AM   Specimen: Anterior Nasal Swab  Result Value Ref Range Status   SARS Coronavirus 2 by RT PCR NEGATIVE NEGATIVE Final   Influenza A by PCR NEGATIVE NEGATIVE Final   Influenza B by PCR NEGATIVE NEGATIVE Final    Comment: (NOTE) The Xpert Xpress SARS-CoV-2/FLU/RSV plus assay is intended as an aid in the diagnosis of influenza from  Nasopharyngeal swab specimens and should not be used as a sole basis for treatment. Nasal washings and aspirates are unacceptable for Xpert Xpress SARS-CoV-2/FLU/RSV testing.  Fact Sheet for Patients: BloggerCourse.com  Fact Sheet for Healthcare Providers: SeriousBroker.it  This test is not yet approved or cleared by the [place] FDA and has been authorized for detection and/or diagnosis of SARS-CoV-2 by FDA under an Emergency Use Authorization (EUA). This EUA will remain in effect (meaning this test can be used) for the duration of the COVID-19 declaration under Section 564(b)(1) of the Act, 21 U.S.C. section 360bbb-3(b)(1), unless the authorization is terminated or revoked.  Resp Syncytial Virus by PCR NEGATIVE NEGATIVE Final    Comment: (NOTE) Fact Sheet for Patients: BloggerCourse.com  Fact Sheet for Healthcare Providers: SeriousBroker.it  This test is not yet approved or cleared by the [place] FDA and has been authorized for detection and/or diagnosis of SARS-CoV-2 by FDA under an Emergency Use Authorization (EUA). This EUA will remain in effect (meaning this test can be used) for the duration of the COVID-19 declaration under Section 564(b)(1) of the Act, 21 U.S.C. section 360bbb-3(b)(1), unless the authorization is terminated or revoked.  Performed at [facility], [address] [place], [place] [address]   Blood Culture (routine x 2)     Status: None   Collection Time: 11/29/22 10:27 AM   Specimen: BLOOD LEFT ARM  Result Value Ref Range Status   Specimen Description BLOOD LEFT ARM  Final   Special Requests   Final    BOTTLES DRAWN AEROBIC AND ANAEROBIC Blood Culture results may not be optimal due to an excessive volume of blood received in culture bottles   Culture   Final    NO GROWTH 5 DAYS Performed at [facility], [address] [place], [place] [address]    Report Status 12/04/2022 FINAL  Final  MRSA Next Gen by PCR, Nasal     Status: None   Collection Time: 11/29/22 10:41 AM   Specimen: Nasal Mucosa; Nasal Swab  Result Value Ref Range Status   MRSA by PCR Next Gen NOT DETECTED NOT DETECTED Final    Comment: (NOTE) The GeneXpert MRSA Assay (FDA approved for NASAL specimens only), is one component of a comprehensive MRSA colonization surveillance program. It is not intended to diagnose MRSA infection nor to guide or monitor treatment for MRSA infections. Test performance is not FDA approved in patients less than 3 years old. Performed at [facility], [address] [place], [place] [address]   Blood Culture (routine x 2)     Status: None   Collection Time: 11/29/22  6:30 PM   Specimen: BLOOD  Result Value Ref Range Status   Specimen Description BLOOD SITE NOT SPECIFIED  Final   Special Requests   Final    BOTTLES DRAWN AEROBIC AND ANAEROBIC Blood Culture adequate volume   Culture   Final    NO GROWTH 5 DAYS Performed at [facility], [address] [place], [place] [address]    Report Status 12/04/2022 FINAL  Final         Radiology Studies: No results found.      Scheduled Meds:  acetaminophen  650 mg Oral Q6H   apixaban  2.5 mg Oral BID   Chlorhexidine Gluconate Cloth  6 each Topical Q0600   cycloSPORINE modified  100 mg Oral q AM   And   cycloSPORINE modified  75 mg Oral QHS   divalproex  250 mg Oral Daily   divalproex  500 mg Oral QHS   DULoxetine  60 mg Oral Daily   feeding supplement (NEPRO CARB STEADY)  237 mL Oral BID BM   guaiFENesin  1,200 mg Oral BID   ipratropium-albuterol  3 mL Nebulization BID   midodrine  10 mg Oral TID WC   multivitamin  1 tablet Oral QHS   mycophenolate  720 mg Oral BID   pregabalin  50 mg Oral Daily   rosuvastatin  10 mg Oral Daily   sevelamer carbonate  1,600 mg Oral TID WC   traZODone  50 mg Oral QHS   Continuous Infusions:   ceFEPime (MAXIPIME) IV 1 g (12/04/22 6734)     LOS: 5 days     Huey Bienenstock, MD [facility]   To contact the attending provider between 7A-7P or the covering provider during after hours 7P-7A, please log into the web site [email or web address] and access using universal [place] password for that web site. If you do not have the password, please call the hospital operator.  12/04/2022, 8:53 AM

## 2022-12-05 DIAGNOSIS — J690 Pneumonitis due to inhalation of food and vomit: Secondary | ICD-10-CM

## 2022-12-05 DIAGNOSIS — G9340 Encephalopathy, unspecified: Secondary | ICD-10-CM | POA: Diagnosis not present

## 2022-12-05 DIAGNOSIS — I4891 Unspecified atrial fibrillation: Secondary | ICD-10-CM | POA: Diagnosis not present

## 2022-12-05 DIAGNOSIS — Z7189 Other specified counseling: Secondary | ICD-10-CM | POA: Diagnosis not present

## 2022-12-05 DIAGNOSIS — J9601 Acute respiratory failure with hypoxia: Secondary | ICD-10-CM | POA: Diagnosis not present

## 2022-12-05 DIAGNOSIS — Z515 Encounter for palliative care: Secondary | ICD-10-CM | POA: Diagnosis not present

## 2022-12-05 DIAGNOSIS — J189 Pneumonia, unspecified organism: Secondary | ICD-10-CM | POA: Diagnosis not present

## 2022-12-05 LAB — CBC WITH DIFFERENTIAL/PLATELET
Abs Immature Granulocytes: 0 10*3/uL (ref 0.00–0.07)
Basophils Absolute: 0.1 10*3/uL (ref 0.0–0.1)
Basophils Relative: 1 %
Eosinophils Absolute: 0.3 10*3/uL (ref 0.0–0.5)
Eosinophils Relative: 3 %
HCT: 25.6 % — ABNORMAL LOW (ref 39.0–52.0)
Hemoglobin: 8.7 g/dL — ABNORMAL LOW (ref 13.0–17.0)
Lymphocytes Relative: 4 %
Lymphs Abs: 0.4 10*3/uL — ABNORMAL LOW (ref 0.7–4.0)
MCH: 30.9 pg (ref 26.0–34.0)
MCHC: 34 g/dL (ref 30.0–36.0)
MCV: 90.8 fL (ref 80.0–100.0)
Monocytes Absolute: 0 10*3/uL — ABNORMAL LOW (ref 0.1–1.0)
Monocytes Relative: 0 %
Neutro Abs: 10.3 10*3/uL — ABNORMAL HIGH (ref 1.7–7.7)
Neutrophils Relative %: 92 %
Platelets: 242 10*3/uL (ref 150–400)
RBC: 2.82 MIL/uL — ABNORMAL LOW (ref 4.22–5.81)
RDW: 16.3 % — ABNORMAL HIGH (ref 11.5–15.5)
WBC: 11.2 10*3/uL — ABNORMAL HIGH (ref 4.0–10.5)
nRBC: 0 % (ref 0.0–0.2)
nRBC: 0 /100 WBC

## 2022-12-05 LAB — BASIC METABOLIC PANEL
Anion gap: 9 (ref 5–15)
BUN: 31 mg/dL — ABNORMAL HIGH (ref 8–23)
CO2: 25 mmol/L (ref 22–32)
Calcium: 9.9 mg/dL (ref 8.9–10.3)
Chloride: 98 mmol/L (ref 98–111)
Creatinine, Ser: 6.76 mg/dL — ABNORMAL HIGH (ref 0.61–1.24)
GFR, Estimated: 9 mL/min — ABNORMAL LOW (ref >60)
Glucose, Bld: 73 mg/dL (ref 70–99)
Potassium: 4.2 mmol/L (ref 3.5–5.1)
Sodium: 132 mmol/L — ABNORMAL LOW (ref 135–145)

## 2022-12-05 NOTE — Discharge Summary (Signed)
PATIENT DETAILS Name: [name] Age: 62 y.o. Sex: male Date of Birth: 05/25/1961 MRN: [id number]. Admitting Physician: [name] GeneralPing T Zhang, MD WUJ:[id number]:[name], [name] Mastersandace, MD  Admit Date: 11/29/2022 Discharge date: 12/05/2022  Recommendations for Outpatient Follow-up:  Follow up with PCP in 1-2 weeks Please obtain CMP/CBC in one week  Admitted From:  Home  Disposition: Outpatient physical therapy   Discharge Condition: fair  CODE STATUS:   Code Status: Full Code   Diet recommendation:  Diet Order             Diet - low sodium heart healthy           Diet regular Room service appropriate? Yes with Assist; Fluid consistency: Thin; Fluid restriction: 1200 mL Fluid  Diet effective now                    Brief Summary: 62 year old with history of ESRD on HD MWF, s/p heart transplant 2014 on immunosuppressive's-brought in for lethargy/confusion-found to have sepsis secondary to multifocal pneumonia.  See below for further details.  Brief Hospital Course: Severe sepsis (present on admission) secondary to multifocal PNA Acute metabolic encephalopathy Acute hypoxic respiratory failure Felt to be secondary to possible aspiration pneumonia (had episode of vomiting) Significantly better after initiation of broad-spectrum antibiotics Sepsis physiology has resolved-hypoxia has resolved-he is on room air, encephalopathy has resolved-he is completely awake and alert All cultures negative to date Patient will complete a course of antibiotics in the hospital-he will get his last dose of IV antibiotics with dialysis today.  ESRD on HD MWF Nephrology followed closely  History of heart transplant Resume immunosuppressive's Follow-up with [facility] transplant as previously scheduled  PAF Eliquis  History of CVA No obvious deficits On Eliquis/crestor  Nutrition Status: Nutrition Problem: Inadequate oral intake Etiology: lethargy/confusion, poor appetite Signs/Symptoms: meal completion  < 50% Interventions: Nepro shake, MVI, Liberalize Diet   Obesity: Estimated body mass index is 30.52 kg/m as calculated from the following:   Height as of this encounter: 6\' 6"  (1.981 m).   Weight as of this encounter: 119.8 kg.    Discharge Diagnoses:  Principal Problem:   PNA (pneumonia) Active Problems:   Atrial fibrillation   Acute encephalopathy   AMS (altered mental status)   Aspiration pneumonia   Discharge Instructions:  Activity:  As tolerated   Discharge Instructions     Call MD for:  difficulty breathing, headache or visual disturbances   Complete by: As directed    Call MD for:  extreme fatigue   Complete by: As directed    Diet - low sodium heart healthy   Complete by: As directed    Discharge instructions   Complete by: As directed    Follow with Primary MD  Merri [name], Candace, MD in 1-2 weeks  Follow-up with hemodialysis clinic as previously scheduled  Follow-up with [facility] transplant as previously scheduled  Please get a complete blood count and chemistry panel checked by your Primary MD at your next visit, and again as instructed by your Primary MD.  Get Medicines reviewed and adjusted: Please take all your medications with you for your next visit with your Primary MD  Laboratory/radiological data: Please request your Primary MD to go over all hospital tests and procedure/radiological results at the follow up, please ask your Primary MD to get all Hospital records sent to his/her office.  In some cases, they will be blood work, cultures and biopsy results pending at the time of your discharge. Please request  that your primary care M.D. follows up on these results.  Also Note the following: If you experience worsening of your admission symptoms, develop shortness of breath, life threatening emergency, suicidal or homicidal thoughts you must seek medical attention immediately by calling 911 or calling your MD immediately  if symptoms less severe.  You  must read complete instructions/literature along with all the possible adverse reactions/side effects for all the Medicines you take and that have been prescribed to you. Take any new Medicines after you have completely understood and accpet all the possible adverse reactions/side effects.   Do not drive when taking Pain medications or sleeping medications (Benzodaizepines)  Do not take more than prescribed Pain, Sleep and Anxiety Medications. It is not advisable to combine anxiety,sleep and pain medications without talking with your primary care practitioner  Special Instructions: If you have smoked or chewed Tobacco  in the last 2 yrs please stop smoking, stop any regular Alcohol  and or any Recreational drug use.  Wear Seat belts while driving.  Please note: You were cared for by a hospitalist during your hospital stay. Once you are discharged, your primary care physician will handle any further medical issues. Please note that NO REFILLS for any discharge medications will be authorized once you are discharged, as it is imperative that you return to your primary care physician (or establish a relationship with a primary care physician if you do not have one) for your post hospital discharge needs so that they can reassess your need for medications and monitor your lab values.   Increase activity slowly   Complete by: As directed       Allergies as of 12/05/2022       Reactions   Heparin Nausea Only, Swelling, Other (See Comments)   * * HIT * * SWELLING REACTION UNSPECIFIED  DIAPHORESIS   Iodinated Contrast Media    Penicillin G Potassium [penicillin G] Nausea Only, Other (See Comments)   DIAPHORESIS        Medication List     STOP taking these medications    methocarbamol 500 MG tablet Commonly known as: ROBAXIN   oxybutynin 5 MG tablet Commonly known as: DITROPAN       TAKE these medications    acetaminophen 500 MG tablet Commonly known as: TYLENOL Take 1,000 mg by  mouth in the morning and at bedtime.   cinacalcet 60 MG tablet Commonly known as: SENSIPAR Take 60 mg by mouth daily.   cycloSPORINE modified 25 MG capsule Commonly known as: NEORAL Take 75-100 mg by mouth 2 (two) times daily. Take 100 mg by mouth in the morning and 75 mg in the evening   divalproex 125 MG DR tablet Commonly known as: DEPAKOTE Take 250-500 mg by mouth in the morning and at bedtime. Take 250 mg by mouth in morning and 500 mg by mouth at night What changed: Another medication with the same name was removed. Continue taking this medication, and follow the directions you see here.   DULoxetine 60 MG capsule Commonly known as: Cymbalta Take 1 capsule (60 mg total) by mouth daily.   Eliquis 2.5 MG Tabs tablet Generic drug: apixaban Take 1 tablet (2.5 mg total) by mouth 2 (two) times daily.   hydrocortisone 2.5 % rectal cream Commonly known as: ANUSOL-HC Apply 1 Application topically as needed for hemorrhoids or anal itching.   ibuprofen 200 MG tablet Commonly known as: ADVIL Take 400 mg by mouth as needed for moderate pain.   lidocaine  5 % ointment Commonly known as: XYLOCAINE Apply 1 application topically as needed. What changed: reasons to take this   lidocaine 5 % Commonly known as: Lidoderm Place 1 patch onto the skin daily. Remove & Discard patch within 12 hours or as directed by MD What changed:  when to take this reasons to take this   midodrine 10 MG tablet Commonly known as: PROAMATINE Take 1 tablet (10 mg total) by mouth 3 (three) times daily.   multivitamin with minerals tablet Take 1 tablet by mouth daily.   mycophenolate 180 MG EC tablet Commonly known as: MYFORTIC Take 720 mg by mouth 2 (two) times daily.   ondansetron 4 MG tablet Commonly known as: ZOFRAN Take 1 tablet (4 mg total) by mouth every 6 (six) hours as needed for nausea. What changed: when to take this   Oxycodone HCl 10 MG Tabs Take 5-10 mg by mouth in the morning and  at bedtime. What changed: Another medication with the same name was removed. Continue taking this medication, and follow the directions you see here.   pregabalin 50 MG capsule Commonly known as: LYRICA Take 50 mg by mouth 2 (two) times daily. What changed: Another medication with the same name was removed. Continue taking this medication, and follow the directions you see here.   rosuvastatin 10 MG tablet Commonly known as: CRESTOR Take 1 tablet (10 mg total) by mouth daily.   sevelamer carbonate 800 MG tablet Commonly known as: RENVELA Take 2 tablets (1,600 mg total) by mouth 3 (three) times daily with meals. What changed:  how much to take when to take this   sucroferric oxyhydroxide 500 MG chewable tablet Commonly known as: VELPHORO Chew 500 mg by mouth in the morning and at bedtime.   traZODone 100 MG tablet Commonly known as: DESYREL Take 100 mg by mouth at bedtime.        Follow-up Information     [facility] [employer]. Call.   Why: Call to arrange transportation to/from dialysis. Reservations must be made by 5 pm one day in advance or a client may schedule a trip from one to seven days prior to the desired travel date. Contact information: [phone]        Merri Brunette, MD. Schedule an appointment as soon as possible for a visit in 1 week(s).   Specialty: Family Medicine Contact information: [address], [address] [place] [address] [phone]                Allergies  Allergen Reactions   Heparin Nausea Only, Swelling and Other (See Comments)    * * HIT * *  SWELLING REACTION UNSPECIFIED   DIAPHORESIS    Iodinated Contrast Media    Penicillin G Potassium [Penicillin G] Nausea Only and Other (See Comments)    DIAPHORESIS     Other Procedures/Studies: CT CHEST WO CONTRAST  Result Date: 11/29/2022 CLINICAL DATA:  Pneumonia, complication suspected, xray done. Lethargy, altered mental status EXAM: CT CHEST WITHOUT  CONTRAST TECHNIQUE: Multidetector CT imaging of the chest was performed following the standard protocol without IV contrast. RADIATION DOSE REDUCTION: This exam was performed according to the departmental dose-optimization program which includes automated exposure control, adjustment of the mA and/or kV according to patient size and/or use of iterative reconstruction technique. COMPARISON:  Chest x-ray today.  Chest CT 07/04/2022 FINDINGS: Cardiovascular: Markedly enlarged central pulmonary arteries. Cardiomegaly with asymmetric enlargement of the left atrium, stable. Prior CABG. Aorta normal caliber with moderate calcifications.  Mediastinum/Nodes: No mediastinal, hilar, or axillary adenopathy. Trachea and esophagus are unremarkable. Thyroid unremarkable. Lungs/Pleura: Extensive bilateral lower lobe airspace opacities concerning for pneumonia. No visible effusions. Upper Abdomen: No acute findings Musculoskeletal: Chest wall soft tissues are unremarkable. No acute bony abnormality. IMPRESSION: Extensive bilateral lower lobe airspace consolidation concerning for pneumonia. Pulmonary artery enlargement compatible with pulmonary arterial hypertension, stable. Left atrial large enlargement, stable. Aortic Atherosclerosis (ICD10-I70.0). Electronically Signed   By: Charlett Nose M.D.   On: 11/29/2022 15:42   CT Head Wo Contrast  Result Date: 11/29/2022 CLINICAL DATA:  Mental status change. Lethargy. Dialysis patient. Heart transplant. EXAM: CT HEAD WITHOUT CONTRAST TECHNIQUE: Contiguous axial images were obtained from the base of the skull through the vertex without intravenous contrast. RADIATION DOSE REDUCTION: This exam was performed according to the departmental dose-optimization program which includes automated exposure control, adjustment of the mA and/or kV according to patient size and/or use of iterative reconstruction technique. COMPARISON:  07/04/2022 FINDINGS: Brain: Moderate low density in the  periventricular white matter likely related to small vessel disease. Right frontal hypoattenuation, including on 21/3 is similar to the prior. Mild age advanced cerebral atrophy. No mass lesion, hemorrhage, hydrocephalus, acute infarct, intra-axial, or extra-axial fluid collection. Vascular: No hyperdense vessel or unexpected calcification. Skull: No significant soft tissue swelling.  No skull fracture. Sinuses/Orbits: Normal imaged portions of the orbits and globes. Clear paranasal sinuses and mastoid air cells. Other: None. IMPRESSION: 1.  No acute intracranial abnormality. 2. Similar appearance of right frontal hypoattenuation which is secondary to remote infarct when compared to 11/09/2018 brain MR report. 3.  Cerebral atrophy and small vessel ischemic change. Electronically Signed   By: Jeronimo Greaves M.D.   On: 11/29/2022 13:50   DG Chest Port 1 View  Result Date: 11/29/2022 CLINICAL DATA:  Questionable sepsis EXAM: PORTABLE CHEST 1 VIEW COMPARISON:  X-ray 07/05/2022 FINDINGS: Postop chest with sternal wires. Enlarged cardiopericardial silhouette with vascular congestion. There is some lung base opacities identified which are similar to previous. Left retrocardiac greater than right. No pneumothorax or effusion. Film is rotated to the left. Overlapping cardiac leads. Right-sided rib fractures again seen. IMPRESSION: Underinflation with lung base parenchymal opacities. Atelectasis versus infiltrate. Postop chest with enlarged heart and central vascular congestion. Electronically Signed   By: Karen Kays M.D.   On: 11/29/2022 11:13   EEG adult  Result Date: 11/08/2022 Levert Feinstein, MD     11/12/2022  7:21 PM HISTORY: 62 year old male with history of end-stage renal disease, polypharmacy treatment presenting with increased confusion body jerking movement TECHNIQUE: This is a routine 16 channel EEG recording with one channel devoted to a limited EKG recording.  It was performed during wakefulness, drowsiness  and asleep. Photic stimulation were performed as activating procedures.  There are minimum muscle and movement artifact noted. Upon maximum arousal, posterior dominant waking rhythm consistent of dysrhythmic low amplitude theta range activity. Activities are symmetric over the bilateral posterior derivations and attenuated with eye opening. Hyperventilation was not performed Photic stimulation did not alter the tracing. During EEG recording, patient developed drowsiness and no deeper stage of sleep was achieved During EEG recording, there was no epileptiform discharge noted. EKG demonstrate normal sinus rhythm. CONCLUSION: This is an abnormal EEG.  There is evidence of generalized moderate slowing, indicating bihemispheric malfunction,, etiology or metabolic toxic. Levert Feinstein, M.D. Ph.D. [facility] [address] [place], [place] [address] Phone: [phone] Fax:      [phone]     TODAY-DAY OF DISCHARGE:  Subjective:  Rocklyn Portee today has no headache,no chest abdominal pain,no new weakness tingling or numbness, feels much better wants to go home today.  Objective:   Blood pressure 114/77, pulse 95, temperature 97.6 F (36.4 C), temperature source Oral, resp. rate 16, height 6\' 6"  (1.981 m), weight 119.8 kg, SpO2 93 %.  Intake/Output Summary (Last 24 hours) at 12/05/2022 0837 Last data filed at 12/04/2022 1600 Gross per 24 hour  Intake 1094.06 ml  Output --  Net 1094.06 ml   Filed Weights   12/01/22 1300 12/03/22 0825 12/03/22 1234  Weight: 118.9 kg 121.9 kg 119.8 kg    Exam: Awake Alert, Oriented *3, No new F.N deficits, Normal affect Taylor.AT,PERRAL Supple Neck,No JVD, No cervical lymphadenopathy appriciated.  Symmetrical Chest wall movement, Good air movement bilaterally, CTAB RRR,No Gallops,Rubs or new Murmurs, No Parasternal Heave +ve B.Sounds, Abd Soft, Non tender, No organomegaly appriciated, No rebound -guarding or rigidity. No Cyanosis, Clubbing or edema, No  new Rash or bruise   PERTINENT RADIOLOGIC STUDIES: No results found.   PERTINENT LAB RESULTS: CBC: Recent Labs    12/03/22 0408 12/05/22 0647  WBC 9.5 11.2*  HGB 8.8* 8.7*  HCT 27.0* 25.6*  PLT 223 242   CMET CMP     Component Value Date/Time   NA 132 (L) 12/05/2022 0647   K 4.2 12/05/2022 0647   CL 98 12/05/2022 0647   CO2 25 12/05/2022 0647   GLUCOSE 73 12/05/2022 0647   BUN 31 (H) 12/05/2022 0647   CREATININE 6.76 (H) 12/05/2022 0647   CALCIUM 9.9 12/05/2022 0647   PROT 6.2 (L) 11/29/2022 1019   ALBUMIN 1.9 (L) 12/03/2022 0408   AST 14 (L) 11/29/2022 1019   ALT 18 11/29/2022 1019   ALKPHOS 85 11/29/2022 1019   BILITOT 1.1 11/29/2022 1019   GFRNONAA 9 (L) 12/05/2022 0647   GFRAA 8 (L) 11/11/2018 0432    GFR Estimated Creatinine Clearance: 16.5 mL/min (A) (by C-G formula based on SCr of 6.76 mg/dL (H)). No results for input(s): "LIPASE", "AMYLASE" in the last 72 hours. No results for input(s): "CKTOTAL", "CKMB", "CKMBINDEX", "TROPONINI" in the last 72 hours. Invalid input(s): "POCBNP" No results for input(s): "DDIMER" in the last 72 hours. No results for input(s): "HGBA1C" in the last 72 hours. No results for input(s): "CHOL", "HDL", "LDLCALC", "TRIG", "CHOLHDL", "LDLDIRECT" in the last 72 hours. No results for input(s): "TSH", "T4TOTAL", "T3FREE", "THYROIDAB" in the last 72 hours.  Invalid input(s): "FREET3" No results for input(s): "VITAMINB12", "FOLATE", "FERRITIN", "TIBC", "IRON", "RETICCTPCT" in the last 72 hours. Coags: No results for input(s): "INR" in the last 72 hours.  Invalid input(s): "PT" Microbiology: Recent Results (from the past 240 hour(s))  Resp panel by RT-PCR (RSV, Flu A&B, Covid) Anterior Nasal Swab     Status: None   Collection Time: 11/29/22 10:27 AM   Specimen: Anterior Nasal Swab  Result Value Ref Range Status   SARS Coronavirus 2 by RT PCR NEGATIVE NEGATIVE Final   Influenza A by PCR NEGATIVE NEGATIVE Final   Influenza B by  PCR NEGATIVE NEGATIVE Final    Comment: (NOTE) The Xpert Xpress SARS-CoV-2/FLU/RSV plus assay is intended as an aid in the diagnosis of influenza from Nasopharyngeal swab specimens and should not be used as a sole basis for treatment. Nasal washings and aspirates are unacceptable for Xpert Xpress SARS-CoV-2/FLU/RSV testing.  Fact Sheet for Patients: BloggerCourse.com  Fact Sheet for Healthcare Providers: SeriousBroker.it  This test is not yet approved or cleared by the [place] FDA  and has been authorized for detection and/or diagnosis of SARS-CoV-2 by FDA under an Emergency Use Authorization (EUA). This EUA will remain in effect (meaning this test can be used) for the duration of the COVID-19 declaration under Section 564(b)(1) of the Act, 21 U.S.C. section 360bbb-3(b)(1), unless the authorization is terminated or revoked.     Resp Syncytial Virus by PCR NEGATIVE NEGATIVE Final    Comment: (NOTE) Fact Sheet for Patients: BloggerCourse.com  Fact Sheet for Healthcare Providers: SeriousBroker.it  This test is not yet approved or cleared by the [place] FDA and has been authorized for detection and/or diagnosis of SARS-CoV-2 by FDA under an Emergency Use Authorization (EUA). This EUA will remain in effect (meaning this test can be used) for the duration of the COVID-19 declaration under Section 564(b)(1) of the Act, 21 U.S.C. section 360bbb-3(b)(1), unless the authorization is terminated or revoked.  Performed at [facility], [address], [place], [place] [address]   Blood Culture (routine x 2)     Status: None   Collection Time: 11/29/22 10:27 AM   Specimen: BLOOD LEFT ARM  Result Value Ref Range Status   Specimen Description BLOOD LEFT ARM  Final   Special Requests   Final    BOTTLES DRAWN AEROBIC AND ANAEROBIC Blood Culture results may not be optimal  due to an excessive volume of blood received in culture bottles   Culture   Final    NO GROWTH 5 DAYS Performed at [facility], [address] [place], [place] [address]    Report Status 12/04/2022 FINAL  Final  MRSA Next Gen by PCR, Nasal     Status: None   Collection Time: 11/29/22 10:41 AM   Specimen: Nasal Mucosa; Nasal Swab  Result Value Ref Range Status   MRSA by PCR Next Gen NOT DETECTED NOT DETECTED Final    Comment: (NOTE) The GeneXpert MRSA Assay (FDA approved for NASAL specimens only), is one component of a comprehensive MRSA colonization surveillance program. It is not intended to diagnose MRSA infection nor to guide or monitor treatment for MRSA infections. Test performance is not FDA approved in patients less than 22 years old. Performed at [facility], [address] [place], [place] [address]   Blood Culture (routine x 2)     Status: None   Collection Time: 11/29/22  6:30 PM   Specimen: BLOOD  Result Value Ref Range Status   Specimen Description BLOOD SITE NOT SPECIFIED  Final   Special Requests   Final    BOTTLES DRAWN AEROBIC AND ANAEROBIC Blood Culture adequate volume   Culture   Final    NO GROWTH 5 DAYS Performed at [facility], [address], [place], [place] [address]    Report Status 12/04/2022 FINAL  Final    FURTHER DISCHARGE INSTRUCTIONS:  Get Medicines reviewed and adjusted: Please take all your medications with you for your next visit with your Primary MD  Laboratory/radiological data: Please request your Primary MD to go over all hospital tests and procedure/radiological results at the follow up, please ask your Primary MD to get all Hospital records sent to his/her office.  In some cases, they will be blood work, cultures and biopsy results pending at the time of your discharge. Please request that your primary care M.D. goes through all the records of your hospital data and follows up on these results.  Also Note the  following: If you experience worsening of your admission  symptoms, develop shortness of breath, life threatening emergency, suicidal or homicidal thoughts you must seek medical attention immediately by calling 911 or calling your MD immediately  if symptoms less severe.  You must read complete instructions/literature along with all the possible adverse reactions/side effects for all the Medicines you take and that have been prescribed to you. Take any new Medicines after you have completely understood and accpet all the possible adverse reactions/side effects.   Do not drive when taking Pain medications or sleeping medications (Benzodaizepines)  Do not take more than prescribed Pain, Sleep and Anxiety Medications. It is not advisable to combine anxiety,sleep and pain medications without talking with your primary care practitioner  Special Instructions: If you have smoked or chewed Tobacco  in the last 2 yrs please stop smoking, stop any regular Alcohol  and or any Recreational drug use.  Wear Seat belts while driving.  Please note: You were cared for by a hospitalist during your hospital stay. Once you are discharged, your primary care physician will handle any further medical issues. Please note that NO REFILLS for any discharge medications will be authorized once you are discharged, as it is imperative that you return to your primary care physician (or establish a relationship with a primary care physician if you do not have one) for your post hospital discharge needs so that they can reassess your need for medications and monitor your lab values.  Total Time spent coordinating discharge including counseling, education and face to face time equals greater than 30 minutes.  SignedJeoffrey Massed 12/05/2022 8:37 AM

## 2022-12-05 NOTE — Progress Notes (Signed)
Received patient in bed to unit.  Alert and oriented.  Informed consent signed and in chart.   TX duration:  Patient tolerated well.  Transported back to the room  Alert, without acute distress.  Hand-off given to patient's nurse.   Access used: AVF Access issues: n/a  Total UF removed: 2L Medication(s) given: Midodrine, Tylenol Post HD weight: 115KG    12/05/22 1300  Vitals  Temp 98 F (36.7 C) (post dialysis)  Temp Source Oral  BP 108/71  MAP (mmHg) 83  Pulse Rate 96  ECG Heart Rate 96  Resp 13  Oxygen Therapy  SpO2 96 %  O2 Device Room Air  Post Treatment  Dialyzer Clearance Lightly streaked  Duration of HD Treatment -hour(s) 3.45 hour(s)  Hemodialysis Intake (mL) 60 mL  Liters Processed 90  Fluid Removed (mL) 2000 mL  Tolerated HD Treatment Yes  AVG/AVF Arterial Site Held (minutes) 5 minutes  AVG/AVF Venous Site Held (minutes) 10 minutes  Fistula / Graft Right Forearm  No placement date or time found.   Orientation: Right  Access Location: Forearm  Site Condition No complications  Fistula / Graft Assessment Present;Thrill;Bruit  Status Deaccessed  Needle Size 15  Drainage Description None       [facility]

## 2022-12-05 NOTE — Progress Notes (Signed)
Speech Language Pathology Treatment: Cognitive-Linquistic  Patient Details Name: [name] MRN: [id number] DOB: [birth year] Today's Date: 12/05/2022 Time: 9166-0600 SLP Time Calculation (min) (ACUTE ONLY): 15 min  Assessment / Plan / Recommendation Clinical Impression  Patient seen by SLP for skilled treatment focused on cognitive function goals. When SLP arrived, patient in bed, awake and alert. He pointed to his IV in left arm and said, "I need the last......" but was unable to complete this thought. (Per RN, he has one more infusion of antibiotics). He continues to become quickly irritated, saying "I need to talk to a [place]!" He then asked to get into the recliner and SLP told him he would ask for help. NT was walking by room around that time and she was able to help. When NT starting to help patient, he looked at SLP, "He's a speech therapist so he can't help". SLP provided tactile assist with NT helping transfer patient to recliner. SLP attempted to engage him in some conversation about the college he went to (had [employer] blanket) and although he started to respond, he abruptly stopped talking and started covering head with blanket. SLP recommending continued acute care level intervention as well as SLP intervention at next venue of care.   HPI HPI: Patient is a 62 y.o. male with PMH: CVA, GERD, CHF s/p heart transplant in 2014, chronic immunosuppressant, ESRD on HD MWF, HTN, HLD, PAF on Eliquis, anxiety/depression, dementia. He presented to the hospital on 11/29/2022 with symptoms of SOB, feeling tired with lethargy and confusion. CXR showed right lower lobe PNA; CT head was negative for acute intracranial abnormality.      SLP Plan  Continue with current plan of care      Recommendations for follow up therapy are one component of a multi-disciplinary discharge planning process, led by the attending physician.  Recommendations may be updated based on patient status, additional functional  criteria and insurance authorization.    Recommendations                     Oral care BID   Frequent or constant Supervision/Assistance Cognitive communication deficit (K59.977)     Continue with current plan of care     Angela Nevin, MA, CCC-SLP Speech Therapy

## 2022-12-05 NOTE — Progress Notes (Signed)
Daily Progress Note   Patient Name: [name]       Date: 12/05/2022 DOB: March 17, 1961  Age: 62 y.o. MRN#: [id number] Attending Physician: Maretta Bees, MD Primary Care Physician: Merri Brunette, MD Admit Date: 11/29/2022  Reason for Consultation/Follow-up: Establishing goals of care  Subjective: I have reviewed medical records including EPIC notes, MAR, and labs. Received report from primary RN - no acute concerns. Patient currently in HD unit.   Called wife/Gwen for follow up prior to discharge per her request yesterday - she requests PMT call back after 10am as she is on a conference call for work.  10:05 AM Called Gwen - emotional support provided. She has not received updates regarding transportation assistance. Provided updates per [facility] notes that transportation organization information will be located in patient's discharge paperwork - she is appreciative. She questions what time patient will be discharged - reviewed he was taken for HD around 0845; anticipate patient will be ready for discharge early afternoon, but informed her TOC or RN would call to notify her when patient was ready. She plans to take patient home by private vehicle.  All questions and concerns addressed. Encouraged to call with questions and/or concerns. PMT number previously provided.  Updated TOC and RN of discussion as above as well as to ensure outpatient PC referral is completed before discharge, which is being arranged now.  Length of Stay: 6  Current Medications: Scheduled Meds:   acetaminophen  650 mg Oral Q6H   apixaban  2.5 mg Oral BID   Chlorhexidine Gluconate Cloth  6 each Topical Q0600   cinacalcet  120 mg Oral Q supper   cycloSPORINE modified  100 mg Oral q AM   And   cycloSPORINE modified   75 mg Oral QHS   divalproex  250 mg Oral Daily   divalproex  500 mg Oral QHS   DULoxetine  60 mg Oral Daily   feeding supplement (NEPRO CARB STEADY)  237 mL Oral BID BM   guaiFENesin  1,200 mg Oral BID   midodrine  10 mg Oral TID WC   multivitamin  1 tablet Oral QHS   mycophenolate  720 mg Oral BID   pregabalin  50 mg Oral Daily   rosuvastatin  10 mg Oral Daily   sevelamer carbonate  1,600 mg Oral TID  WC   traZODone  50 mg Oral QHS    Continuous Infusions:  ceFEPime (MAXIPIME) IV 200 mL/hr at 12/04/22 0900    PRN Meds: ipratropium-albuterol, methocarbamol, ondansetron **OR** ondansetron (ZOFRAN) IV, mouth rinse, oxybutynin, oxyCODONE, senna-docusate  Physical Exam        - unable to assess, in HD  Vital Signs: BP 108/65   Pulse 89   Temp 98.3 F (36.8 C)   Resp (!) 23   Ht 6\' 6"  (1.981 m)   Wt 116.8 kg   SpO2 95%   BMI 29.76 kg/m  SpO2: SpO2: 95 % O2 Device: O2 Device: Room Air O2 Flow Rate: O2 Flow Rate (L/min): 2 L/min  Intake/output summary:  Intake/Output Summary (Last 24 hours) at 12/05/2022 0914 Last data filed at 12/04/2022 1600 Gross per 24 hour  Intake 480 ml  Output --  Net 480 ml   LBM: Last BM Date : 12/04/22 Baseline Weight: Weight: (!) 140 kg Most recent weight: Weight: 116.8 kg       Palliative Assessment/Data: PPS 50%      Patient Active Problem List   Diagnosis Date Noted   AMS (altered mental status) 11/29/2022   Aspiration pneumonia 11/29/2022   PNA (pneumonia) 11/29/2022   Peripheral autonomic neuropathy due to DM 11/01/2022   Orthostatic hypotension 11/01/2022   Acute encephalopathy 07/04/2022   Chronic kidney disease, stage 5 11/14/2021   Gait abnormality 10/12/2021   Polyneuropathy associated with underlying disease 10/12/2021   Neuropathic pain 10/12/2021   Morbid obesity 11/19/2019   Sleep disorder 10/22/2019   Sleep disturbance 10/22/2019   Abnormality of gait 06/09/2019   Diabetic polyneuropathy associated with diabetes  mellitus due to underlying condition 04/14/2019   Peripheral neuropathy 12/11/2018   CVA (cerebral vascular accident) 11/09/2018   H/O heart transplant 11/09/2018   Malignant neoplasm of prostate 05/06/2018   Closed posterior dislocation of hip, right, initial encounter 08/24/2017   End stage renal disease on dialysis 08/24/2017   Hip dislocation, right 08/24/2017   Osteoarthritis of right hip 07/22/2017   VENTRICULAR TACHYCARDIA 12/21/2009   Dyslipidemia 08/30/2009   Essential hypertension 08/30/2009   ISCHEMIC CARDIOMYOPATHY 08/30/2009   Atrial fibrillation 08/30/2009   VENTRICULAR ECTOPY 08/30/2009   History of CVA (cerebrovascular accident) 08/30/2009   Obesity, Class III, BMI 40-49.9 (morbid obesity) 08/30/2009   DM type 2 with diabetic peripheral neuropathy 10/17/2007   OBSTRUCTIVE SLEEP APNEA 10/17/2007   CARDIOMYOPATHY, DILATED 10/17/2007    Palliative Care Assessment & Plan   Patient Profile: 62 y.o. male  with past medical history of CHF status post heart transplant in 2014, on chronic immunosuppressant, ESRD on HD MWF, HTN, HLD, PAF on Eliquis, chronically on narcotics, anxiety/depression, recently diagnosed dementia  admitted on 11/29/2022 with lethargy and confusion, as well as generalized weakness.    Patient has been admitted for sepsis and acute hypoxic respiratory failure secondary to aspiration pneumonia. PMT has been consulted to assist with goals of care conversation.  Assessment: Principal Problem:   PNA (pneumonia) Active Problems:   Atrial fibrillation   Acute encephalopathy   AMS (altered mental status)   Aspiration pneumonia   Recommendations/Plan: Continue full code/full scope Plan is for discharge home today after completion of IV antibiotics and HD. Goal is for patient to continue HD and outpatient PT after discharge. Outpatient Palliative Care to follow PMT will continue to follow peripherally. If there are any imminent needs please call the  service directly  Goals of Care and Additional Recommendations: Limitations on Scope  of Treatment: Full Comfort Care  Code Status:    Code Status Orders  (From admission, onward)           Start     Ordered   11/29/22 1431  Full code  Continuous       Question:  By:  Answer:  Consent: discussion documented in EHR   11/29/22 1432           Code Status History     Date Active Date Inactive Code Status Order ID Comments User Context   07/04/2022 1910 07/06/2022 2131 Full Code [id number]  Synetta Fail, MD ED   11/09/2018 1201 11/11/2018 1437 DNR [id number]  Jonah Blue, MD ED   08/24/2017 2055 08/25/2017 1330 Full Code [id number]  Yolonda Kida, MD Inpatient   07/22/2017 1340 07/24/2017 2118 Full Code [id number]  Samson Frederic, MD Inpatient       Prognosis:  Guarded to poor  Discharge Planning: Home with Palliative Services  Care plan was discussed with primary RN, patient's wife  Thank you for allowing the Palliative Medicine Team to assist in the care of this patient.   Total Time 35 minutes Prolonged Time Billed  no       Greater than 50%  of this time was spent counseling and coordinating care related to the above assessment and plan.  Haskel Khan, NP  Please contact Palliative Medicine Team phone at [phone] for questions and concerns.   *Portions of this note are a verbal dictation therefore any spelling and/or grammatical errors are due to the "Dragon Medical One" system interpretation.

## 2022-12-05 NOTE — Progress Notes (Signed)
OT Cancellation Note  Patient Details Name: [name] MRN: [id number] DOB: [birth year]   Cancelled Treatment:    Reason Eval/Treat Not Completed: Patient at procedure or test/ unavailable (at HD). Will return as schedule allows.   Tyler Deis, OTR/L [facility] Acute Rehabilitation Office: [phone]   Myrla Halsted 12/05/2022, 11:59 AM

## 2022-12-05 NOTE — Progress Notes (Signed)
Patient called to the nurse's station and advised he had fallen out of the chair.  Myself, Christiane Ha, RN and [place], [place] responded to the room to find the patient sitting in the floor in front of the chair.  Patient advised that even though he had non-slip hospital socks on that they were still slippery.  Patient advised was trying to turn in the chair and slipped out the front down to the floor.  We assisted patient to the chair and then to the bed with no complaints of pain.  Vital signs were taken and stable.  Neuro check was performed and patient was at his baseline.  Patient advised did not hit head only landed on butt.  Patient had previously been placed in the chair by speech therapist and [place], NT.  The chair alarm was not on.  Patient was left in the bed, with call bell within reach, bed alarm on and bed at lowest level.  Patient was alert sitting up in bed.

## 2022-12-05 NOTE — Progress Notes (Signed)
Pt to d/c to home today. Pt to receive last dose of iv abx today prior to d/c per providers notes. Contacted [facility] to advise clinic of pt's d/c today and that pt should resume care on Friday.   Olivia Canter Renal Navigator [phone]

## 2022-12-05 NOTE — Care Management Important Message (Signed)
Important Message  Patient Details  Name: [name] MRN: [id number] Date of Birth: 1960/11/09   Medicare Important Message Given:  Yes     [name] 12/05/2022, 4:13 PM

## 2022-12-05 NOTE — Progress Notes (Signed)
[facility] Progress Note   Subjective:  Seen on HD - 2L UFG ordered. Will be dialyzed and then receive his last dose abx, then plan is for discharge home this afternoon. No CP/dyspnea.  Objective Vitals:   12/04/22 1200 12/04/22 1600 12/04/22 1900 12/05/22 0727  BP: 138/87 124/75 98/66 114/77  Pulse: 92 96 94 95  Resp: 15 20 18 16   Temp: 98 F (36.7 C) 98.6 F (37 C) 97.8 F (36.6 C) 97.6 F (36.4 C)  TempSrc: Oral Oral Oral Oral  SpO2:  98% 97% 93%  Weight:      Height:       Physical Exam General: Well appearing, NAD. Room air. Heart: RRR; no murmur Lungs: CTAB; no rales or wheezing Abdomen: soft Extremities: no LE edema Dialysis Access:  R AVF + bruit  Additional Objective Labs: Basic Metabolic Panel: Recent Labs  Lab 12/01/22 0304 12/03/22 0408 12/05/22 0647  NA 133* 132* 132*  K 4.0 4.0 4.2  CL 93* 96* 98  CO2 26 25 25   GLUCOSE 78 71 73  BUN 39* 34* 31*  CREATININE 7.83* 6.96* 6.76*  CALCIUM 10.3 10.1 9.9  PHOS 3.5 2.6  --    Liver Function Tests: Recent Labs  Lab 11/29/22 1019 12/01/22 0304 12/03/22 0408  AST 14*  --   --   ALT 18  --   --   ALKPHOS 85  --   --   BILITOT 1.1  --   --   PROT 6.2*  --   --   ALBUMIN 2.4* 2.1* 1.9*   CBC: Recent Labs  Lab 11/29/22 1019 11/29/22 1037 11/30/22 1423 12/01/22 0304 12/03/22 0408 12/05/22 0647  WBC 9.8  --  9.2 9.7 9.5 11.2*  NEUTROABS 8.1*  --   --   --   --  10.3*  HGB 9.5*   < > 8.1* 9.4* 8.8* 8.7*  HCT 28.3*   < > 24.8* 28.4* 27.0* 25.6*  MCV 94.3  --  93.9 94.0 93.1 90.8  PLT 206  --  195 192 223 242   < > = values in this interval not displayed.   CBG: Recent Labs  Lab 11/29/22 1044  GLUCAP 75   Medications:  ceFEPime (MAXIPIME) IV 200 mL/hr at 12/04/22 0900    acetaminophen  650 mg Oral Q6H   apixaban  2.5 mg Oral BID   Chlorhexidine Gluconate Cloth  6 each Topical Q0600   cinacalcet  120 mg Oral Q supper   cycloSPORINE modified  100 mg Oral q AM   And    cycloSPORINE modified  75 mg Oral QHS   divalproex  250 mg Oral Daily   divalproex  500 mg Oral QHS   DULoxetine  60 mg Oral Daily   feeding supplement (NEPRO CARB STEADY)  237 mL Oral BID BM   guaiFENesin  1,200 mg Oral BID   midodrine  10 mg Oral TID WC   multivitamin  1 tablet Oral QHS   mycophenolate  720 mg Oral BID   pregabalin  50 mg Oral Daily   rosuvastatin  10 mg Oral Daily   sevelamer carbonate  1,600 mg Oral TID WC   traZODone  50 mg Oral QHS    Dialysis Orders: [facility]  4h  500/2.0   120kg  3K/2Ca bath  AVF Hep none - last HD 4/01, post wt 120.3kg  - mircera 120mg  IV q 4 wks, last 3/29, due 4/26 - no vdra  Assessment/Plan: AHRF + pneumonia: Improving, will finish course of IV Cefepime tomorrow. Tremors/dementia: Ongoing decline of MS over last 6 mos per family.  F/b [place] [facility].  ESRD: Continue HD on usual MWF schedule - HD now - 2L UFG. HypoTN/ volume: On mido 10mg  TID, edema resolved. Slightly under prior EDW. Anemia of ESRD: Hgb 8.8 - restart ESA on discharge. Secondary HPTH: CorrCa high, not on VDRA. Continue Renvela as binder. Looks like takes sensipar as outpatient and was not getting here -> resumed 4/9.Marland Kitchen Hx heart transplant - takes CyA and myfortic  Hx CVA Hx prostate cancer Atrial fib IDDM Hx charcot foot  Ozzie Hoyle, PA-C 12/05/2022, 8:44 AM  [facility]

## 2022-12-05 NOTE — Plan of Care (Signed)

## 2022-12-05 NOTE — TOC Progression Note (Signed)
Transition of Care [facility]) - Progression Note    Patient Details  Name: [name] MRN: [id number] Date of Birth: 12/23/1960  Transition of [facility]) CM/SW Contact  Gordy Clement, RN Phone Number: 12/05/2022, 12:22 PM  Clinical Narrative:     Patient will DC to home with Wife. He is an HD Patient and Wife is also interested in Palliative care at Home.  [facility] will be providing Palliative care and will reach out to Wife.  Wife states she will transport home. Outpatient PT and OT have been recommended. Referrals made  Wife is aware of transportation resource info on AVS- Patient is Active with [facility] and CM reminder Patient's Wife to contact them to arrange HD transportation.       No additional TOC needs at this time.          Expected Discharge Plan and Services         Expected Discharge Date: 12/05/22                                     Social Determinants of Health (SDOH) Interventions SDOH Screenings   Food Insecurity: No Food Insecurity (07/05/2022)  Housing: Low Risk  (07/05/2022)  Transportation Needs: No Transportation Needs (07/05/2022)  Utilities: Not At Risk (07/05/2022)  Depression (PHQ2-9): Low Risk  (05/05/2020)  Recent Concern: Depression (PHQ2-9) - Medium Risk (02/18/2020)  Tobacco Use: Medium Risk (11/20/2022)    Readmission Risk Interventions     No data to display

## 2022-12-05 NOTE — Progress Notes (Addendum)
Informed by patient slid off his chair-and was very difficult-required 2 person assist to get back on his feet.  Went to bedside-patient is adamant on going home-he claims that he has neuropathy-and always has difficulty in standing up.  He does not wish to remain hospitalized anymore.  I did urge him to see if he could stay another night and we could get physical therapy to see him tomorrow morning to ensure safe disposition.  However he did not want to hear any of this-and was adamant in going home today.  He claims that he is at his baseline and that he will continue outpatient physical therapy as planned.He is aware of the risk of catastrophic falls  Note-RN Horris Latino at bedside during my encounter with the patient.

## 2022-12-06 ENCOUNTER — Telehealth (HOSPITAL_COMMUNITY): Payer: Self-pay | Admitting: Nephrology

## 2022-12-06 ENCOUNTER — Ambulatory Visit: Payer: Medicare Other | Admitting: Physical Therapy

## 2022-12-06 NOTE — Telephone Encounter (Signed)
Transition of care contact from inpatient facility  Date of Discharge: 12/05/2022  Date of Contact: 12/06/2022 -- attempted Method of contact: Phone  Attempted to contact patient to discuss transition of care from inpatient admission. Patient did not answer the phone. Message was left on the patient's voicemail with call back number [phone].   Ozzie Hoyle, PA-C [employer] Pager [phone]

## 2022-12-07 DIAGNOSIS — D509 Iron deficiency anemia, unspecified: Secondary | ICD-10-CM | POA: Diagnosis not present

## 2022-12-07 DIAGNOSIS — D631 Anemia in chronic kidney disease: Secondary | ICD-10-CM | POA: Diagnosis not present

## 2022-12-07 DIAGNOSIS — N186 End stage renal disease: Secondary | ICD-10-CM | POA: Diagnosis not present

## 2022-12-07 DIAGNOSIS — E1129 Type 2 diabetes mellitus with other diabetic kidney complication: Secondary | ICD-10-CM | POA: Diagnosis not present

## 2022-12-07 DIAGNOSIS — N2581 Secondary hyperparathyroidism of renal origin: Secondary | ICD-10-CM | POA: Diagnosis not present

## 2022-12-07 DIAGNOSIS — Z992 Dependence on renal dialysis: Secondary | ICD-10-CM | POA: Diagnosis not present

## 2022-12-10 DIAGNOSIS — R5383 Other fatigue: Secondary | ICD-10-CM | POA: Diagnosis not present

## 2022-12-10 DIAGNOSIS — D631 Anemia in chronic kidney disease: Secondary | ICD-10-CM | POA: Diagnosis not present

## 2022-12-10 DIAGNOSIS — J9601 Acute respiratory failure with hypoxia: Secondary | ICD-10-CM | POA: Diagnosis not present

## 2022-12-10 DIAGNOSIS — E1129 Type 2 diabetes mellitus with other diabetic kidney complication: Secondary | ICD-10-CM | POA: Diagnosis not present

## 2022-12-10 DIAGNOSIS — N186 End stage renal disease: Secondary | ICD-10-CM | POA: Diagnosis not present

## 2022-12-10 DIAGNOSIS — D509 Iron deficiency anemia, unspecified: Secondary | ICD-10-CM | POA: Diagnosis not present

## 2022-12-10 DIAGNOSIS — N2581 Secondary hyperparathyroidism of renal origin: Secondary | ICD-10-CM | POA: Diagnosis not present

## 2022-12-10 DIAGNOSIS — Z992 Dependence on renal dialysis: Secondary | ICD-10-CM | POA: Diagnosis not present

## 2022-12-11 ENCOUNTER — Encounter: Payer: Self-pay | Admitting: Physical Therapy

## 2022-12-11 ENCOUNTER — Ambulatory Visit: Payer: Medicare Other | Admitting: Physical Therapy

## 2022-12-11 DIAGNOSIS — R2681 Unsteadiness on feet: Secondary | ICD-10-CM

## 2022-12-11 DIAGNOSIS — Z6829 Body mass index (BMI) 29.0-29.9, adult: Secondary | ICD-10-CM | POA: Diagnosis not present

## 2022-12-11 DIAGNOSIS — N186 End stage renal disease: Secondary | ICD-10-CM | POA: Diagnosis not present

## 2022-12-11 DIAGNOSIS — F339 Major depressive disorder, recurrent, unspecified: Secondary | ICD-10-CM | POA: Diagnosis not present

## 2022-12-11 DIAGNOSIS — G8929 Other chronic pain: Secondary | ICD-10-CM | POA: Diagnosis not present

## 2022-12-11 DIAGNOSIS — Z9181 History of falling: Secondary | ICD-10-CM

## 2022-12-11 DIAGNOSIS — R293 Abnormal posture: Secondary | ICD-10-CM | POA: Diagnosis not present

## 2022-12-11 DIAGNOSIS — Z992 Dependence on renal dialysis: Secondary | ICD-10-CM | POA: Diagnosis not present

## 2022-12-11 DIAGNOSIS — R5381 Other malaise: Secondary | ICD-10-CM

## 2022-12-11 DIAGNOSIS — G629 Polyneuropathy, unspecified: Secondary | ICD-10-CM | POA: Diagnosis not present

## 2022-12-11 DIAGNOSIS — M6281 Muscle weakness (generalized): Secondary | ICD-10-CM | POA: Diagnosis not present

## 2022-12-11 DIAGNOSIS — Z0130 Encounter for examination of blood pressure without abnormal findings: Secondary | ICD-10-CM | POA: Diagnosis not present

## 2022-12-11 DIAGNOSIS — M5030 Other cervical disc degeneration, unspecified cervical region: Secondary | ICD-10-CM | POA: Diagnosis not present

## 2022-12-11 DIAGNOSIS — D508 Other iron deficiency anemias: Secondary | ICD-10-CM | POA: Diagnosis not present

## 2022-12-11 DIAGNOSIS — M549 Dorsalgia, unspecified: Secondary | ICD-10-CM | POA: Diagnosis not present

## 2022-12-11 DIAGNOSIS — E538 Deficiency of other specified B group vitamins: Secondary | ICD-10-CM | POA: Diagnosis not present

## 2022-12-11 DIAGNOSIS — M4802 Spinal stenosis, cervical region: Secondary | ICD-10-CM | POA: Diagnosis not present

## 2022-12-11 NOTE — Therapy (Signed)
OUTPATIENT PHYSICAL THERAPY NEURO TREATMENT   Patient Name: [name] MRN: [id number] DOB:1961/07/04, 62 y.o., male Today's Date: 12/11/2022   PCP: Merri Brunette REFERRING PROVIDER: Jetta Lout  END OF SESSION:  PT End of Session - 12/11/22 1559     Visit Number 3    Date for PT Re-Evaluation 02/05/23    PT Start Time 1600    PT Stop Time 1645    PT Time Calculation (min) 45 min    Activity Tolerance Patient tolerated treatment well    Behavior During Therapy WFL for tasks assessed/performed             Past Medical History:  Diagnosis Date   Arthritis    Atrial fibrillation    CHF (congestive heart failure), NYHA class III    s/p heart transplant   Chronic kidney disease    end stage   Chronic systolic dysfunction of left ventricle    CVA (cerebral infarction)    Diabetes mellitus    PMH; Prior to heart transplant   Family history of coronary artery disease    in both parents   GERD (gastroesophageal reflux disease)    Hyperlipidemia    Hypertension    Left bundle branch block    Morbid obesity    status post lap band   Myocardial infarction    prior to heart transplant   Nonischemic cardiomyopathy    prior to heart transplant   Obesity (BMI 30-39.9)    Obstructive sleep apnea    no longer needs CPAP after heart transplant per pt   Premature ventricular contractions    Prostate cancer    SOB (shortness of breath)    Past Surgical History:  Procedure Laterality Date   CARDIAC PACEMAKER PLACEMENT  09/21/2009   Biventricular implantable cardioverter-defibrillator implantation      COLONOSCOPY W/ BIOPSIES AND POLYPECTOMY     CYSTOSCOPY WITH FULGERATION N/A 04/27/2021   Procedure: CYSTOSCOPY AND CLOT EVACUATION WITH BLADDER BIOPSY/ FUGARATION OF BLADDER/ FULGARATION OF PROSTATE ;  Surgeon: Jerilee Field, MD;  Location: [facility];  Service: Urology;  Laterality: N/A;   FOOT SURGERY     left   HEART TRANSPLANT  2014   LAPAROSCOPIC GASTRIC  BANDING  01/27/2007   LEFT VENTRICULAR ASSIST DEVICE     implanted at [facility]   PACEMAKER REMOVAL     PROSTATE BIOPSY     TOTAL HIP ARTHROPLASTY Right 07/22/2017   Procedure: RIGHT TOTAL HIP ARTHROPLASTY ANTERIOR APPROACH;  Surgeon: Samson Frederic, MD;  Location: [facility];  Service: Orthopedics;  Laterality: Right;  Needs RNFA   TOTAL KNEE ARTHROPLASTY Right 07/22/2017   Patient Active Problem List   Diagnosis Date Noted   AMS (altered mental status) 11/29/2022   Aspiration pneumonia 11/29/2022   PNA (pneumonia) 11/29/2022   Peripheral autonomic neuropathy due to DM 11/01/2022   Orthostatic hypotension 11/01/2022   Acute encephalopathy 07/04/2022   Chronic kidney disease, stage 5 11/14/2021   Gait abnormality 10/12/2021   Polyneuropathy associated with underlying disease 10/12/2021   Neuropathic pain 10/12/2021   Morbid obesity 11/19/2019   Sleep disorder 10/22/2019   Sleep disturbance 10/22/2019   Abnormality of gait 06/09/2019   Diabetic polyneuropathy associated with diabetes mellitus due to underlying condition 04/14/2019   Peripheral neuropathy 12/11/2018   CVA (cerebral vascular accident) 11/09/2018   H/O heart transplant 11/09/2018   Malignant neoplasm of prostate 05/06/2018   Closed posterior dislocation of hip, right, initial encounter 08/24/2017   End stage renal disease  on dialysis 08/24/2017   Hip dislocation, right 08/24/2017   Osteoarthritis of right hip 07/22/2017   VENTRICULAR TACHYCARDIA 12/21/2009   Dyslipidemia 08/30/2009   Essential hypertension 08/30/2009   ISCHEMIC CARDIOMYOPATHY 08/30/2009   Atrial fibrillation 08/30/2009   VENTRICULAR ECTOPY 08/30/2009   History of CVA (cerebrovascular accident) 08/30/2009   Obesity, Class III, BMI 40-49.9 (morbid obesity) 08/30/2009   DM type 2 with diabetic peripheral neuropathy 10/17/2007   OBSTRUCTIVE SLEEP APNEA 10/17/2007   CARDIOMYOPATHY, DILATED 10/17/2007    ONSET DATE: 10/31/22  REFERRING DIAG:  D84.9  (ICD-10-CM) - Immunodeficiency, unspecified Z94.1 (ICD-10-CM) - Heart transplant status Z48.298 (ICD-10-CM) - Encounter for aftercare following other organ transplant Z79.899 (ICD-10-CM) - Other long term (current) drug therapy G20.C (ICD-10-CM) - Parkinsonism, unspecified  THERAPY DIAG:  Physical deconditioning  Abnormal posture  Muscle weakness (generalized)  Unsteadiness on feet  History of falling  Rationale for Evaluation and Treatment: Rehabilitation  SUBJECTIVE:                                                                                                                                                                                             SUBJECTIVE STATEMENT: "All right"  Pt accompanied by: significant other  PERTINENT HISTORY: heart transplant 7 years ago  On dialysis MWF, heart transplant 6 yrs ago, ESRD, prostate CA, OSA, obesity, CHF, GERD, had DM, CVA with residual left hand weakness, peripheral neuropathy, R THA, AFib   PAIN:  Are you having pain? No  PRECAUTIONS: None  WEIGHT BEARING RESTRICTIONS: No  FALLS: Has patient fallen in last 6 months? Yes. Number of falls more than 10  LIVING ENVIRONMENT: Lives with: lives with their family Lives in: House/apartment Stairs: No Has following equipment at home: Environmental consultant - 2 wheeled, Wheelchair (manual), shower chair, and Grab bars  PLOF: Independent with household mobility with device and Independent with community mobility with device  PATIENT GOALS: want to be able to get him to stand and be more mobile and build his strength  OBJECTIVE:   COGNITION: Overall cognitive status: Impaired and History of cognitive impairments - at baseline   SENSATION: WFL   POSTURE: rounded shoulders, forward head, increased thoracic kyphosis, and flexed trunk   LOWER EXTREMITY ROM:   limited with all motions due to weakness   LOWER EXTREMITY MMT:    MMT Right Eval Left Eval  Hip flexion 2- 2-  Hip  extension    Hip abduction 2+ 2+  Hip adduction 2+ 2+  Hip internal rotation    Hip external rotation    Knee flexion    Knee extension 2- 2+  Ankle dorsiflexion  3+ 3+  Ankle plantarflexion    Ankle inversion    Ankle eversion    (Blank rows = not tested)  TRANSFERS: Assistive device utilized: Environmental consultant - 2 wheeled  Sit to stand: Max A  GAIT: Level of assistance: Max A as wife reports Comments: unable to assess. Wife reports he takes a few steps in the room with walker  FUNCTIONAL TESTS:  5 times sit to stand: unable to do    TODAY'S TREATMENT:                                                                                                                              DATE:  12/11/22 NuStep L 4 x 6 min S2S from elevated surface w/UE 2x10 Seated rows R theratube with handles 2x15 Standing march w/ RW two sets  LAQ no weight  HS curls green 2x15 Seated volleyball Gait 8 ft no AD decrease step length and height   11/20/22 Nustep level 3 x 5 minutes UE and LE then 1.5 minutes with LE only 1 set 10 in W/C straight arm pulls and rows 10# and 5# Seated volleyball Standing with walker marches Gait with w/c 100 feet with w/c follow LAQ no weight  Transfers to and from the car Mod/Max A  11/13/22-EVAL    PATIENT EDUCATION: Education details: POC and HEP  Person educated: Patient and Spouse Education method: Explanation Education comprehension: verbalized understanding  HOME EXERCISE PROGRAM: Access Code: [email or web address] URL: [email or web address] Date: 11/13/2022 Prepared by: Cassie Freer  Exercises - Seated March  - 1 x daily - 7 x weekly - 2 sets - 10 reps - Seated Knee Extension with Anchored Resistance  - 1 x daily - 7 x weekly - 2 sets - 10 reps - Seated Hip Abduction with Resistance  - 1 x daily - 7 x weekly - 2 sets - 10 reps - Seated Hip Adduction Isometrics with Ball  - 1 x daily - 7 x weekly - 2 sets - 10 reps - Seated Shoulder Horizontal Abduction  with Resistance  - 1 x daily - 7 x weekly - 2 sets - 10 reps  GOALS: Goals reviewed with patient? No  SHORT TERM GOALS: Target date: 12/25/22  Patient will be independent with initial HEP. Goal status: Progressing  2.  Patient will demonstrate improved sitting posture in wheelchair Baseline: increased kyphosis and flexed trunk Goal status: INITIAL  3.  Patient will be modA with transfers Baseline: maxA Goal status: INITIAL   LONG TERM GOALS: Target date: 02/05/23  Patient will be independent with advanced/ongoing HEP to improve outcomes and carryover.  Goal status: INITIAL  2.  Patient will demonstrate improved functional LE strength as demonstrated by 4 or better. Baseline: 2/5  Goal status: INITIAL  3.  Patient will be independent with sit to stand from wheelchair Baseline: maxA Goal status: INITIAL  4.  Patient will be able to take at least 5 steps with rolling walker  minA Baseline: per wife can take a few steps with modA Goal status: INITIAL   ASSESSMENT:  CLINICAL IMPRESSION: Continued with  exercises and walking, he does need time to process and to perform tasks.  SBA needed wth transfers today. Pt unable to complete S2S without UE assist. RLE appeared to be weaker than L with LAQ and HS curls. No reports of pain during session.     OBJECTIVE IMPAIRMENTS: decreased activity tolerance, decreased coordination, decreased endurance, decreased knowledge of condition, decreased mobility, difficulty walking, decreased strength, improper body mechanics, postural dysfunction, and obesity.   ACTIVITY LIMITATIONS: sitting, standing, squatting, stairs, transfers, bed mobility, bathing, toileting, dressing, hygiene/grooming, and locomotion level  PERSONAL FACTORS: Age, Behavior pattern, Fitness, Past/current experiences, Time since onset of injury/illness/exacerbation, and 3+ comorbidities: hx of stroke, heart and kidney transplant, hx of falls, dementia, DM, obesity, ESRD,  Afib  are also affecting patient's functional outcome.   REHAB POTENTIAL: Fair    CLINICAL DECISION MAKING: Evolving/moderate complexity  EVALUATION COMPLEXITY: Moderate   PLAN:  PT FREQUENCY: 2x/week  PT DURATION: 12 weeks  PLANNED INTERVENTIONS: Therapeutic exercises, Therapeutic activity, Neuromuscular re-education, Balance training, Gait training, Patient/Family education, Self Care, Joint mobilization, Stair training, Wheelchair mobility training, Cryotherapy, Moist heat, and Manual therapy  PLAN FOR NEXT SESSION: strengthening in wheelchair for LE and UE, can work on transfers    Grayce Sessions, PTA 12/11/2022, 4:00 PM

## 2022-12-12 DIAGNOSIS — E1129 Type 2 diabetes mellitus with other diabetic kidney complication: Secondary | ICD-10-CM | POA: Diagnosis not present

## 2022-12-12 DIAGNOSIS — D509 Iron deficiency anemia, unspecified: Secondary | ICD-10-CM | POA: Diagnosis not present

## 2022-12-12 DIAGNOSIS — D631 Anemia in chronic kidney disease: Secondary | ICD-10-CM | POA: Diagnosis not present

## 2022-12-12 DIAGNOSIS — Z992 Dependence on renal dialysis: Secondary | ICD-10-CM | POA: Diagnosis not present

## 2022-12-12 DIAGNOSIS — N2581 Secondary hyperparathyroidism of renal origin: Secondary | ICD-10-CM | POA: Diagnosis not present

## 2022-12-12 DIAGNOSIS — N186 End stage renal disease: Secondary | ICD-10-CM | POA: Diagnosis not present

## 2022-12-13 ENCOUNTER — Encounter: Payer: Self-pay | Admitting: Physical Therapy

## 2022-12-13 ENCOUNTER — Ambulatory Visit: Payer: Medicare Other | Admitting: Physical Therapy

## 2022-12-13 DIAGNOSIS — R2681 Unsteadiness on feet: Secondary | ICD-10-CM

## 2022-12-13 DIAGNOSIS — M6281 Muscle weakness (generalized): Secondary | ICD-10-CM | POA: Diagnosis not present

## 2022-12-13 DIAGNOSIS — R5381 Other malaise: Secondary | ICD-10-CM

## 2022-12-13 DIAGNOSIS — R293 Abnormal posture: Secondary | ICD-10-CM | POA: Diagnosis not present

## 2022-12-13 DIAGNOSIS — Z9181 History of falling: Secondary | ICD-10-CM | POA: Diagnosis not present

## 2022-12-13 NOTE — Therapy (Signed)
OUTPATIENT PHYSICAL THERAPY NEURO TREATMENT   Patient Name: [name] MRN: [id number] DOB:[birth year], 62 y.o., male Today's Date: 12/13/2022   PCP: Merri Brunette REFERRING PROVIDER: Jetta Lout  END OF SESSION:  PT End of Session - 12/13/22 1618     Visit Number 4    Number of Visits 17    Date for PT Re-Evaluation 02/05/23    Authorization Type Medicare    PT Start Time 1610    PT Stop Time 1655    PT Time Calculation (min) 45 min    Activity Tolerance Patient tolerated treatment well    Behavior During Therapy WFL for tasks assessed/performed             Past Medical History:  Diagnosis Date   Arthritis    Atrial fibrillation    CHF (congestive heart failure), NYHA class III    s/p heart transplant   Chronic kidney disease    end stage   Chronic systolic dysfunction of left ventricle    CVA (cerebral infarction)    Diabetes mellitus    PMH; Prior to heart transplant   Family history of coronary artery disease    in both parents   GERD (gastroesophageal reflux disease)    Hyperlipidemia    Hypertension    Left bundle branch block    Morbid obesity    status post lap band   Myocardial infarction    prior to heart transplant   Nonischemic cardiomyopathy    prior to heart transplant   Obesity (BMI 30-39.9)    Obstructive sleep apnea    no longer needs CPAP after heart transplant per pt   Premature ventricular contractions    Prostate cancer    SOB (shortness of breath)    Past Surgical History:  Procedure Laterality Date   CARDIAC PACEMAKER PLACEMENT  09/21/2009   Biventricular implantable cardioverter-defibrillator implantation      COLONOSCOPY W/ BIOPSIES AND POLYPECTOMY     CYSTOSCOPY WITH FULGERATION N/A 04/27/2021   Procedure: CYSTOSCOPY AND CLOT EVACUATION WITH BLADDER BIOPSY/ FUGARATION OF BLADDER/ FULGARATION OF PROSTATE ;  Surgeon: Jerilee Field, MD;  Location: [facility];  Service: Urology;  Laterality: N/A;   FOOT SURGERY     left    HEART TRANSPLANT  2014   LAPAROSCOPIC GASTRIC BANDING  01/27/2007   LEFT VENTRICULAR ASSIST DEVICE     implanted at [facility]   PACEMAKER REMOVAL     PROSTATE BIOPSY     TOTAL HIP ARTHROPLASTY Right 07/22/2017   Procedure: RIGHT TOTAL HIP ARTHROPLASTY ANTERIOR APPROACH;  Surgeon: Samson Frederic, MD;  Location: [facility];  Service: Orthopedics;  Laterality: Right;  Needs RNFA   TOTAL KNEE ARTHROPLASTY Right 07/22/2017   Patient Active Problem List   Diagnosis Date Noted   AMS (altered mental status) 11/29/2022   Aspiration pneumonia 11/29/2022   PNA (pneumonia) 11/29/2022   Peripheral autonomic neuropathy due to DM 11/01/2022   Orthostatic hypotension 11/01/2022   Acute encephalopathy 07/04/2022   Chronic kidney disease, stage 5 11/14/2021   Gait abnormality 10/12/2021   Polyneuropathy associated with underlying disease 10/12/2021   Neuropathic pain 10/12/2021   Morbid obesity 11/19/2019   Sleep disorder 10/22/2019   Sleep disturbance 10/22/2019   Abnormality of gait 06/09/2019   Diabetic polyneuropathy associated with diabetes mellitus due to underlying condition 04/14/2019   Peripheral neuropathy 12/11/2018   CVA (cerebral vascular accident) 11/09/2018   H/O heart transplant 11/09/2018   Malignant neoplasm of prostate 05/06/2018   Closed posterior  dislocation of hip, right, initial encounter 08/24/2017   End stage renal disease on dialysis 08/24/2017   Hip dislocation, right 08/24/2017   Osteoarthritis of right hip 07/22/2017   VENTRICULAR TACHYCARDIA 12/21/2009   Dyslipidemia 08/30/2009   Essential hypertension 08/30/2009   ISCHEMIC CARDIOMYOPATHY 08/30/2009   Atrial fibrillation 08/30/2009   VENTRICULAR ECTOPY 08/30/2009   History of CVA (cerebrovascular accident) 08/30/2009   Obesity, Class III, BMI 40-49.9 (morbid obesity) 08/30/2009   DM type 2 with diabetic peripheral neuropathy 10/17/2007   OBSTRUCTIVE SLEEP APNEA 10/17/2007   CARDIOMYOPATHY, DILATED 10/17/2007     ONSET DATE: 10/31/22  REFERRING DIAG:  D84.9 (ICD-10-CM) - Immunodeficiency, unspecified Z94.1 (ICD-10-CM) - Heart transplant status Z48.298 (ICD-10-CM) - Encounter for aftercare following other organ transplant Z79.899 (ICD-10-CM) - Other long term (current) drug therapy G20.C (ICD-10-CM) - Parkinsonism, unspecified  THERAPY DIAG:  Physical deconditioning  Abnormal posture  Muscle weakness (generalized)  Unsteadiness on feet  Rationale for Evaluation and Treatment: Rehabilitation  SUBJECTIVE:                                                                                                                                                                                             SUBJECTIVE STATEMENT: "No falls, doing okay  Pt accompanied by: significant other  PERTINENT HISTORY: heart transplant 7 years ago  On dialysis MWF, heart transplant 6 yrs ago, ESRD, prostate CA, OSA, obesity, CHF, GERD, had DM, CVA with residual left hand weakness, peripheral neuropathy, R THA, AFib   PAIN:  Are you having pain? No  PRECAUTIONS: None  WEIGHT BEARING RESTRICTIONS: No  FALLS: Has patient fallen in last 6 months? Yes. Number of falls more than 10  LIVING ENVIRONMENT: Lives with: lives with their family Lives in: House/apartment Stairs: No Has following equipment at home: Environmental consultant - 2 wheeled, Wheelchair (manual), shower chair, and Grab bars  PLOF: Independent with household mobility with device and Independent with community mobility with device  PATIENT GOALS: want to be able to get him to stand and be more mobile and build his strength  OBJECTIVE:   COGNITION: Overall cognitive status: Impaired and History of cognitive impairments - at baseline   SENSATION: WFL   POSTURE: rounded shoulders, forward head, increased thoracic kyphosis, and flexed trunk   LOWER EXTREMITY ROM:   limited with all motions due to weakness   LOWER EXTREMITY MMT:    MMT Right Eval  Left Eval  Hip flexion 2- 2-  Hip extension    Hip abduction 2+ 2+  Hip adduction 2+ 2+  Hip internal rotation    Hip external rotation    Knee  flexion    Knee extension 2- 2+  Ankle dorsiflexion 3+ 3+  Ankle plantarflexion    Ankle inversion    Ankle eversion    (Blank rows = not tested)  TRANSFERS: Assistive device utilized: Environmental consultant - 2 wheeled  Sit to stand: Max A  GAIT: Level of assistance: Max A as wife reports Comments: unable to assess. Wife reports he takes a few steps in the room with walker  FUNCTIONAL TESTS:  5 times sit to stand: unable to do  12/13/22 TUG = 36 seconds   TODAY'S TREATMENT:                                                                                                                              DATE:  12/13/22 In w/c rows and extension 2x10 each Nustep level 5 x 6 minutes Leg curls 35# 3x10 Leg extension 5# some cues for full ROM 3x10 TUG 36 seconds Ball in lap isometric abs Seated unsupported volley ball Gait with FWW x100 feet 2x  12/11/22 NuStep L 4 x 6 min S2S from elevated surface w/UE 2x10 Seated rows R theratube with handles 2x15 Standing march w/ RW two sets  LAQ no weight  HS curls green 2x15 Seated volleyball Gait 8 ft no AD decrease step length and height   11/20/22 Nustep level 3 x 5 minutes UE and LE then 1.5 minutes with LE only 1 set 10 in W/C straight arm pulls and rows 10# and 5# Seated volleyball Standing with walker marches Gait with w/c 100 feet with w/c follow LAQ no weight  Transfers to and from the car Mod/Max A  11/13/22-EVAL    PATIENT EDUCATION: Education details: POC and HEP  Person educated: Patient and Spouse Education method: Explanation Education comprehension: verbalized understanding  HOME EXERCISE PROGRAM: Access Code: [email or web address] URL: [email or web address] Date: 11/13/2022 Prepared by: Cassie Freer  Exercises - Seated March  - 1 x daily - 7 x weekly - 2 sets - 10 reps -  Seated Knee Extension with Anchored Resistance  - 1 x daily - 7 x weekly - 2 sets - 10 reps - Seated Hip Abduction with Resistance  - 1 x daily - 7 x weekly - 2 sets - 10 reps - Seated Hip Adduction Isometrics with Ball  - 1 x daily - 7 x weekly - 2 sets - 10 reps - Seated Shoulder Horizontal Abduction with Resistance  - 1 x daily - 7 x weekly - 2 sets - 10 reps  GOALS: Goals reviewed with patient? No  SHORT TERM GOALS: Target date: 12/25/22  Patient will be independent with initial HEP. Goal status: Progressing  2.  Patient will demonstrate improved sitting posture in wheelchair Baseline: increased kyphosis and flexed trunk Goal status: progressing 12/13/22  3.  Patient will be modA with transfers Baseline: maxA Goal status: met 12/13/22   LONG TERM GOALS: Target date: 02/05/23  Patient will be independent with advanced/ongoing HEP to  improve outcomes and carryover.  Goal status: INITIAL  2.  Patient will demonstrate improved functional LE strength as demonstrated by 4 or better. Baseline: 2/5  Goal status: INITIAL  3.  Patient will be independent with sit to stand from wheelchair Baseline: maxA Goal status: INITIAL  4.  Patient will be able to take at least 5 steps with rolling walker minA Baseline: per wife can take a few steps with modA Goal status: INITIAL   ASSESSMENT:  CLINICAL IMPRESSION: Patient doing well, you do have to give him time to process and respond.  He was able to do some leg machines today and do the transfers safely with Mod A, if from a higher surface he can do with Min A, I was able to do a TUG today at 36 seconds   OBJECTIVE IMPAIRMENTS: decreased activity tolerance, decreased coordination, decreased endurance, decreased knowledge of condition, decreased mobility, difficulty walking, decreased strength, improper body mechanics, postural dysfunction, and obesity.   ACTIVITY LIMITATIONS: sitting, standing, squatting, stairs, transfers, bed mobility,  bathing, toileting, dressing, hygiene/grooming, and locomotion level  PERSONAL FACTORS: Age, Behavior pattern, Fitness, Past/current experiences, Time since onset of injury/illness/exacerbation, and 3+ comorbidities: hx of stroke, heart and kidney transplant, hx of falls, dementia, DM, obesity, ESRD, Afib  are also affecting patient's functional outcome.   REHAB POTENTIAL: Fair    CLINICAL DECISION MAKING: Evolving/moderate complexity  EVALUATION COMPLEXITY: Moderate   PLAN:  PT FREQUENCY: 2x/week  PT DURATION: 12 weeks  PLANNED INTERVENTIONS: Therapeutic exercises, Therapeutic activity, Neuromuscular re-education, Balance training, Gait training, Patient/Family education, Self Care, Joint mobilization, Stair training, Wheelchair mobility training, Cryotherapy, Moist heat, and Manual therapy  PLAN FOR NEXT SESSION: strengthening in wheelchair for LE and UE, can work on transfers    [employer], PT 12/13/2022, 4:22 PM

## 2022-12-14 DIAGNOSIS — N2581 Secondary hyperparathyroidism of renal origin: Secondary | ICD-10-CM | POA: Diagnosis not present

## 2022-12-14 DIAGNOSIS — N186 End stage renal disease: Secondary | ICD-10-CM | POA: Diagnosis not present

## 2022-12-14 DIAGNOSIS — E1129 Type 2 diabetes mellitus with other diabetic kidney complication: Secondary | ICD-10-CM | POA: Diagnosis not present

## 2022-12-14 DIAGNOSIS — D631 Anemia in chronic kidney disease: Secondary | ICD-10-CM | POA: Diagnosis not present

## 2022-12-14 DIAGNOSIS — D509 Iron deficiency anemia, unspecified: Secondary | ICD-10-CM | POA: Diagnosis not present

## 2022-12-14 DIAGNOSIS — Z992 Dependence on renal dialysis: Secondary | ICD-10-CM | POA: Diagnosis not present

## 2022-12-17 ENCOUNTER — Telehealth: Payer: Self-pay | Admitting: Neurology

## 2022-12-17 DIAGNOSIS — E1129 Type 2 diabetes mellitus with other diabetic kidney complication: Secondary | ICD-10-CM | POA: Diagnosis not present

## 2022-12-17 DIAGNOSIS — N2581 Secondary hyperparathyroidism of renal origin: Secondary | ICD-10-CM | POA: Diagnosis not present

## 2022-12-17 DIAGNOSIS — Z992 Dependence on renal dialysis: Secondary | ICD-10-CM | POA: Diagnosis not present

## 2022-12-17 DIAGNOSIS — D631 Anemia in chronic kidney disease: Secondary | ICD-10-CM | POA: Diagnosis not present

## 2022-12-17 DIAGNOSIS — D509 Iron deficiency anemia, unspecified: Secondary | ICD-10-CM | POA: Diagnosis not present

## 2022-12-17 DIAGNOSIS — N186 End stage renal disease: Secondary | ICD-10-CM | POA: Diagnosis not present

## 2022-12-17 MED ORDER — DIVALPROEX SODIUM 125 MG PO DR TAB: DELAYED_RELEASE_TABLET | ORAL | 11 refills | Status: AC

## 2022-12-17 NOTE — Addendum Note (Signed)
Addended by: Levert Feinstein on: 12/17/2022 04:14 PM   Modules accepted: Orders

## 2022-12-17 NOTE — Telephone Encounter (Signed)
Pt wife want to know if Dr. Terrace Arabia will refill divalproex (DEPAKOTE) 125 MG DR tablet, Stated pt received medication from hospital. She said medication is working for pt and she would like for him to continue medication.

## 2022-12-17 NOTE — Telephone Encounter (Signed)
Meds ordered this encounter  Medications   divalproex (DEPAKOTE) 125 MG DR tablet    Sig: Take 250 mg by mouth in morning and 500 mg by mouth at night    Dispense:  180 tablet    Refill:  11

## 2022-12-18 ENCOUNTER — Ambulatory Visit: Payer: Medicare Other | Admitting: Physical Therapy

## 2022-12-18 ENCOUNTER — Encounter: Payer: Self-pay | Admitting: Physical Therapy

## 2022-12-18 DIAGNOSIS — R5381 Other malaise: Secondary | ICD-10-CM | POA: Diagnosis not present

## 2022-12-18 DIAGNOSIS — R293 Abnormal posture: Secondary | ICD-10-CM | POA: Diagnosis not present

## 2022-12-18 DIAGNOSIS — R2681 Unsteadiness on feet: Secondary | ICD-10-CM

## 2022-12-18 DIAGNOSIS — M6281 Muscle weakness (generalized): Secondary | ICD-10-CM | POA: Diagnosis not present

## 2022-12-18 DIAGNOSIS — Z9181 History of falling: Secondary | ICD-10-CM

## 2022-12-18 NOTE — Therapy (Signed)
OUTPATIENT PHYSICAL THERAPY NEURO TREATMENT   Patient Name: [name] MRN: [id number] DOB:1961/05/21, 62 y.o., male Today's Date: 12/18/2022   PCP: Merri Brunette REFERRING PROVIDER: Jetta Lout  END OF SESSION:  PT End of Session - 12/18/22 1706     Visit Number 5    Number of Visits 17    Date for PT Re-Evaluation 02/05/23    Authorization Type Medicare    PT Start Time 1700    PT Stop Time 1746    PT Time Calculation (min) 46 min    Equipment Utilized During Treatment Gait belt    Activity Tolerance Patient tolerated treatment well    Behavior During Therapy WFL for tasks assessed/performed             Past Medical History:  Diagnosis Date   Arthritis    Atrial fibrillation    CHF (congestive heart failure), NYHA class III    s/p heart transplant   Chronic kidney disease    end stage   Chronic systolic dysfunction of left ventricle    CVA (cerebral infarction)    Diabetes mellitus    PMH; Prior to heart transplant   Family history of coronary artery disease    in both parents   GERD (gastroesophageal reflux disease)    Hyperlipidemia    Hypertension    Left bundle branch block    Morbid obesity    status post lap band   Myocardial infarction    prior to heart transplant   Nonischemic cardiomyopathy    prior to heart transplant   Obesity (BMI 30-39.9)    Obstructive sleep apnea    no longer needs CPAP after heart transplant per pt   Premature ventricular contractions    Prostate cancer    SOB (shortness of breath)    Past Surgical History:  Procedure Laterality Date   CARDIAC PACEMAKER PLACEMENT  09/21/2009   Biventricular implantable cardioverter-defibrillator implantation      COLONOSCOPY W/ BIOPSIES AND POLYPECTOMY     CYSTOSCOPY WITH FULGERATION N/A 04/27/2021   Procedure: CYSTOSCOPY AND CLOT EVACUATION WITH BLADDER BIOPSY/ FUGARATION OF BLADDER/ FULGARATION OF PROSTATE ;  Surgeon: Jerilee Field, MD;  Location: [facility];  Service:  Urology;  Laterality: N/A;   FOOT SURGERY     left   HEART TRANSPLANT  2014   LAPAROSCOPIC GASTRIC BANDING  01/27/2007   LEFT VENTRICULAR ASSIST DEVICE     implanted at [facility]   PACEMAKER REMOVAL     PROSTATE BIOPSY     TOTAL HIP ARTHROPLASTY Right 07/22/2017   Procedure: RIGHT TOTAL HIP ARTHROPLASTY ANTERIOR APPROACH;  Surgeon: Samson Frederic, MD;  Location: [facility];  Service: Orthopedics;  Laterality: Right;  Needs RNFA   TOTAL KNEE ARTHROPLASTY Right 07/22/2017   Patient Active Problem List   Diagnosis Date Noted   AMS (altered mental status) 11/29/2022   Aspiration pneumonia 11/29/2022   PNA (pneumonia) 11/29/2022   Peripheral autonomic neuropathy due to DM 11/01/2022   Orthostatic hypotension 11/01/2022   Acute encephalopathy 07/04/2022   Chronic kidney disease, stage 5 11/14/2021   Gait abnormality 10/12/2021   Polyneuropathy associated with underlying disease 10/12/2021   Neuropathic pain 10/12/2021   Morbid obesity 11/19/2019   Sleep disorder 10/22/2019   Sleep disturbance 10/22/2019   Abnormality of gait 06/09/2019   Diabetic polyneuropathy associated with diabetes mellitus due to underlying condition 04/14/2019   Peripheral neuropathy 12/11/2018   CVA (cerebral vascular accident) 11/09/2018   H/O heart transplant 11/09/2018  Malignant neoplasm of prostate 05/06/2018   Closed posterior dislocation of hip, right, initial encounter 08/24/2017   End stage renal disease on dialysis 08/24/2017   Hip dislocation, right 08/24/2017   Osteoarthritis of right hip 07/22/2017   VENTRICULAR TACHYCARDIA 12/21/2009   Dyslipidemia 08/30/2009   Essential hypertension 08/30/2009   ISCHEMIC CARDIOMYOPATHY 08/30/2009   Atrial fibrillation 08/30/2009   VENTRICULAR ECTOPY 08/30/2009   History of CVA (cerebrovascular accident) 08/30/2009   Obesity, Class III, BMI 40-49.9 (morbid obesity) 08/30/2009   DM type 2 with diabetic peripheral neuropathy 10/17/2007   OBSTRUCTIVE SLEEP APNEA  10/17/2007   CARDIOMYOPATHY, DILATED 10/17/2007    ONSET DATE: 10/31/22  REFERRING DIAG:  D84.9 (ICD-10-CM) - Immunodeficiency, unspecified Z94.1 (ICD-10-CM) - Heart transplant status Z48.298 (ICD-10-CM) - Encounter for aftercare following other organ transplant Z79.899 (ICD-10-CM) - Other long term (current) drug therapy G20.C (ICD-10-CM) - Parkinsonism, unspecified  THERAPY DIAG:  Physical deconditioning  Abnormal posture  Muscle weakness (generalized)  Unsteadiness on feet  History of falling  Rationale for Evaluation and Treatment: Rehabilitation  SUBJECTIVE:                                                                                                                                                                                             SUBJECTIVE STATEMENT: Doing okay, I feel a little better today  Pt accompanied by: significant other  PERTINENT HISTORY: heart transplant 7 years ago  On dialysis MWF, heart transplant 6 yrs ago, ESRD, prostate CA, OSA, obesity, CHF, GERD, had DM, CVA with residual left hand weakness, peripheral neuropathy, R THA, AFib   PAIN:  Are you having pain? No  PRECAUTIONS: None  WEIGHT BEARING RESTRICTIONS: No  FALLS: Has patient fallen in last 6 months? Yes. Number of falls more than 10  LIVING ENVIRONMENT: Lives with: lives with their family Lives in: House/apartment Stairs: No Has following equipment at home: Environmental consultant - 2 wheeled, Wheelchair (manual), shower chair, and Grab bars  PLOF: Independent with household mobility with device and Independent with community mobility with device  PATIENT GOALS: want to be able to get him to stand and be more mobile and build his strength  OBJECTIVE:   COGNITION: Overall cognitive status: Impaired and History of cognitive impairments - at baseline   SENSATION: WFL   POSTURE: rounded shoulders, forward head, increased thoracic kyphosis, and flexed trunk   LOWER EXTREMITY ROM:    limited with all motions due to weakness   LOWER EXTREMITY MMT:    MMT Right Eval Left Eval  Hip flexion 2- 2-  Hip extension    Hip abduction 2+ 2+  Hip  adduction 2+ 2+  Hip internal rotation    Hip external rotation    Knee flexion    Knee extension 2- 2+  Ankle dorsiflexion 3+ 3+  Ankle plantarflexion    Ankle inversion    Ankle eversion    (Blank rows = not tested)  TRANSFERS: Assistive device utilized: Environmental consultant - 2 wheeled  Sit to stand: Max A  GAIT: Level of assistance: Max A as wife reports Comments: unable to assess. Wife reports he takes a few steps in the room with walker  FUNCTIONAL TESTS:  5 times sit to stand: unable to do  12/13/22 TUG = 36 seconds   TODAY'S TREATMENT:                                                                                                                              DATE:  12/18/22 Nustep level 5 x 7 minutes Seated Row and straight arm extensions 10# in chair with arms LEg extension 5# cues for TKE 3x10 LEg curls 35# 3x10 Leg press 40# 3x10 Chest press 10# 2x10 some left shoulder pain Gait with gait belt, FWW 170 feet  12/13/22 In w/c rows and extension 2x10 each Nustep level 5 x 6 minutes Leg curls 35# 3x10 Leg extension 5# some cues for full ROM 3x10 TUG 36 seconds Ball in lap isometric abs Seated unsupported volley ball Gait with FWW x100 feet 2x  12/11/22 NuStep L 4 x 6 min S2S from elevated surface w/UE 2x10 Seated rows R theratube with handles 2x15 Standing march w/ RW two sets  LAQ no weight  HS curls green 2x15 Seated volleyball Gait 8 ft no AD decrease step length and height   11/20/22 Nustep level 3 x 5 minutes UE and LE then 1.5 minutes with LE only 1 set 10 in W/C straight arm pulls and rows 10# and 5# Seated volleyball Standing with walker marches Gait with w/c 100 feet with w/c follow LAQ no weight  Transfers to and from the car Mod/Max A  11/13/22-EVAL    PATIENT EDUCATION: Education details:  POC and HEP  Person educated: Patient and Spouse Education method: Explanation Education comprehension: verbalized understanding  HOME EXERCISE PROGRAM: Access Code: [email or web address] URL: [email or web address] Date: 11/13/2022 Prepared by: Cassie Freer  Exercises - Seated March  - 1 x daily - 7 x weekly - 2 sets - 10 reps - Seated Knee Extension with Anchored Resistance  - 1 x daily - 7 x weekly - 2 sets - 10 reps - Seated Hip Abduction with Resistance  - 1 x daily - 7 x weekly - 2 sets - 10 reps - Seated Hip Adduction Isometrics with Ball  - 1 x daily - 7 x weekly - 2 sets - 10 reps - Seated Shoulder Horizontal Abduction with Resistance  - 1 x daily - 7 x weekly - 2 sets - 10 reps  GOALS: Goals reviewed with patient? No  SHORT TERM GOALS: Target  date: 12/25/22  Patient will be independent with initial HEP. Goal status: Progressing  2.  Patient will demonstrate improved sitting posture in wheelchair Baseline: increased kyphosis and flexed trunk Goal status: progressing 12/13/22  3.  Patient will be modA with transfers Baseline: maxA Goal status: met 12/13/22   LONG TERM GOALS: Target date: 02/05/23  Patient will be independent with advanced/ongoing HEP to improve outcomes and carryover.  Goal status: INITIAL  2.  Patient will demonstrate improved functional LE strength as demonstrated by 4 or better. Baseline: 2/5  Goal status: INITIAL  3.  Patient will be independent with sit to stand from wheelchair Baseline: maxA Goal status: progressing 12/18/22  4.  Patient will be able to take at least 5 steps with rolling walker minA Baseline: per wife can take a few steps with modA Goal status: INITIAL   ASSESSMENT:  CLINICAL IMPRESSION: Patient doing well, you do have to give him time to process and respond.  I added more exercises for overall fitness and strength, leg press fatigued him and the chest press caused some left shoulder pain.  He has a lot of difficulty  standing from most of our surfaces as he is so tall and once fatigued he needed Mod A to get up  OBJECTIVE IMPAIRMENTS: decreased activity tolerance, decreased coordination, decreased endurance, decreased knowledge of condition, decreased mobility, difficulty walking, decreased strength, improper body mechanics, postural dysfunction, and obesity.   ACTIVITY LIMITATIONS: sitting, standing, squatting, stairs, transfers, bed mobility, bathing, toileting, dressing, hygiene/grooming, and locomotion level  PERSONAL FACTORS: Age, Behavior pattern, Fitness, Past/current experiences, Time since onset of injury/illness/exacerbation, and 3+ comorbidities: hx of stroke, heart and kidney transplant, hx of falls, dementia, DM, obesity, ESRD, Afib  are also affecting patient's functional outcome.   REHAB POTENTIAL: Fair    CLINICAL DECISION MAKING: Evolving/moderate complexity  EVALUATION COMPLEXITY: Moderate   PLAN:  PT FREQUENCY: 2x/week  PT DURATION: 12 weeks  PLANNED INTERVENTIONS: Therapeutic exercises, Therapeutic activity, Neuromuscular re-education, Balance training, Gait training, Patient/Family education, Self Care, Joint mobilization, Stair training, Wheelchair mobility training, Cryotherapy, Moist heat, and Manual therapy  PLAN FOR NEXT SESSION: strengthening in wheelchair for LE and UE, can work on transfers    [employer], PT 12/18/2022, 5:07 PM

## 2022-12-19 DIAGNOSIS — D631 Anemia in chronic kidney disease: Secondary | ICD-10-CM | POA: Diagnosis not present

## 2022-12-19 DIAGNOSIS — N186 End stage renal disease: Secondary | ICD-10-CM | POA: Diagnosis not present

## 2022-12-19 DIAGNOSIS — D509 Iron deficiency anemia, unspecified: Secondary | ICD-10-CM | POA: Diagnosis not present

## 2022-12-19 DIAGNOSIS — N2581 Secondary hyperparathyroidism of renal origin: Secondary | ICD-10-CM | POA: Diagnosis not present

## 2022-12-19 DIAGNOSIS — E1129 Type 2 diabetes mellitus with other diabetic kidney complication: Secondary | ICD-10-CM | POA: Diagnosis not present

## 2022-12-19 DIAGNOSIS — Z992 Dependence on renal dialysis: Secondary | ICD-10-CM | POA: Diagnosis not present

## 2022-12-20 ENCOUNTER — Encounter: Payer: Self-pay | Admitting: Physical Therapy

## 2022-12-20 ENCOUNTER — Ambulatory Visit: Payer: Medicare Other | Admitting: Physical Therapy

## 2022-12-20 DIAGNOSIS — M6281 Muscle weakness (generalized): Secondary | ICD-10-CM

## 2022-12-20 DIAGNOSIS — R293 Abnormal posture: Secondary | ICD-10-CM | POA: Diagnosis not present

## 2022-12-20 DIAGNOSIS — R5381 Other malaise: Secondary | ICD-10-CM | POA: Diagnosis not present

## 2022-12-20 DIAGNOSIS — R2681 Unsteadiness on feet: Secondary | ICD-10-CM

## 2022-12-20 DIAGNOSIS — Z9181 History of falling: Secondary | ICD-10-CM | POA: Diagnosis not present

## 2022-12-20 NOTE — Therapy (Signed)
OUTPATIENT PHYSICAL THERAPY NEURO TREATMENT   Patient Name: [name] MRN: [id number] DOB:[birth year], 62 y.o., male Today's Date: 12/20/2022   PCP: Merri Brunette REFERRING PROVIDER: Jetta Lout  END OF SESSION:  PT End of Session - 12/20/22 1613     Visit Number 6    Date for PT Re-Evaluation 02/05/23    Authorization Type Medicare    PT Start Time 1610    PT Stop Time 1658    PT Time Calculation (min) 48 min    Activity Tolerance Patient tolerated treatment well    Behavior During Therapy WFL for tasks assessed/performed             Past Medical History:  Diagnosis Date   Arthritis    Atrial fibrillation    CHF (congestive heart failure), NYHA class III    s/p heart transplant   Chronic kidney disease    end stage   Chronic systolic dysfunction of left ventricle    CVA (cerebral infarction)    Diabetes mellitus    PMH; Prior to heart transplant   Family history of coronary artery disease    in both parents   GERD (gastroesophageal reflux disease)    Hyperlipidemia    Hypertension    Left bundle branch block    Morbid obesity    status post lap band   Myocardial infarction    prior to heart transplant   Nonischemic cardiomyopathy    prior to heart transplant   Obesity (BMI 30-39.9)    Obstructive sleep apnea    no longer needs CPAP after heart transplant per pt   Premature ventricular contractions    Prostate cancer    SOB (shortness of breath)    Past Surgical History:  Procedure Laterality Date   CARDIAC PACEMAKER PLACEMENT  09/21/2009   Biventricular implantable cardioverter-defibrillator implantation      COLONOSCOPY W/ BIOPSIES AND POLYPECTOMY     CYSTOSCOPY WITH FULGERATION N/A 04/27/2021   Procedure: CYSTOSCOPY AND CLOT EVACUATION WITH BLADDER BIOPSY/ FUGARATION OF BLADDER/ FULGARATION OF PROSTATE ;  Surgeon: Jerilee Field, MD;  Location: [facility];  Service: Urology;  Laterality: N/A;   FOOT SURGERY     left   HEART TRANSPLANT   2014   LAPAROSCOPIC GASTRIC BANDING  01/27/2007   LEFT VENTRICULAR ASSIST DEVICE     implanted at [facility]   PACEMAKER REMOVAL     PROSTATE BIOPSY     TOTAL HIP ARTHROPLASTY Right 07/22/2017   Procedure: RIGHT TOTAL HIP ARTHROPLASTY ANTERIOR APPROACH;  Surgeon: Samson Frederic, MD;  Location: [facility];  Service: Orthopedics;  Laterality: Right;  Needs RNFA   TOTAL KNEE ARTHROPLASTY Right 07/22/2017   Patient Active Problem List   Diagnosis Date Noted   AMS (altered mental status) 11/29/2022   Aspiration pneumonia 11/29/2022   PNA (pneumonia) 11/29/2022   Peripheral autonomic neuropathy due to DM 11/01/2022   Orthostatic hypotension 11/01/2022   Acute encephalopathy 07/04/2022   Chronic kidney disease, stage 5 11/14/2021   Gait abnormality 10/12/2021   Polyneuropathy associated with underlying disease 10/12/2021   Neuropathic pain 10/12/2021   Morbid obesity 11/19/2019   Sleep disorder 10/22/2019   Sleep disturbance 10/22/2019   Abnormality of gait 06/09/2019   Diabetic polyneuropathy associated with diabetes mellitus due to underlying condition 04/14/2019   Peripheral neuropathy 12/11/2018   CVA (cerebral vascular accident) 11/09/2018   H/O heart transplant 11/09/2018   Malignant neoplasm of prostate 05/06/2018   Closed posterior dislocation of hip, right, initial encounter 08/24/2017  End stage renal disease on dialysis 08/24/2017   Hip dislocation, right 08/24/2017   Osteoarthritis of right hip 07/22/2017   VENTRICULAR TACHYCARDIA 12/21/2009   Dyslipidemia 08/30/2009   Essential hypertension 08/30/2009   ISCHEMIC CARDIOMYOPATHY 08/30/2009   Atrial fibrillation 08/30/2009   VENTRICULAR ECTOPY 08/30/2009   History of CVA (cerebrovascular accident) 08/30/2009   Obesity, Class III, BMI 40-49.9 (morbid obesity) 08/30/2009   DM type 2 with diabetic peripheral neuropathy 10/17/2007   OBSTRUCTIVE SLEEP APNEA 10/17/2007   CARDIOMYOPATHY, DILATED 10/17/2007    ONSET DATE:  10/31/22  REFERRING DIAG:  D84.9 (ICD-10-CM) - Immunodeficiency, unspecified Z94.1 (ICD-10-CM) - Heart transplant status Z48.298 (ICD-10-CM) - Encounter for aftercare following other organ transplant Z79.899 (ICD-10-CM) - Other long term (current) drug therapy G20.C (ICD-10-CM) - Parkinsonism, unspecified  THERAPY DIAG:  Physical deconditioning  Abnormal posture  Muscle weakness (generalized)  Unsteadiness on feet  History of falling  Rationale for Evaluation and Treatment: Rehabilitation  SUBJECTIVE:                                                                                                                                                                                             SUBJECTIVE STATEMENT: Reports very tired with the PT, a little sore mms  Pt accompanied by: significant other  PERTINENT HISTORY: heart transplant 7 years ago  On dialysis MWF, heart transplant 6 yrs ago, ESRD, prostate CA, OSA, obesity, CHF, GERD, had DM, CVA with residual left hand weakness, peripheral neuropathy, R THA, AFib   PAIN:  Are you having pain? No  PRECAUTIONS: None  WEIGHT BEARING RESTRICTIONS: No  FALLS: Has patient fallen in last 6 months? Yes. Number of falls more than 10  LIVING ENVIRONMENT: Lives with: lives with their family Lives in: House/apartment Stairs: No Has following equipment at home: Environmental consultant - 2 wheeled, Wheelchair (manual), shower chair, and Grab bars  PLOF: Independent with household mobility with device and Independent with community mobility with device  PATIENT GOALS: want to be able to get him to stand and be more mobile and build his strength  OBJECTIVE:   COGNITION: Overall cognitive status: Impaired and History of cognitive impairments - at baseline   SENSATION: WFL   POSTURE: rounded shoulders, forward head, increased thoracic kyphosis, and flexed trunk   LOWER EXTREMITY ROM:   limited with all motions due to weakness   LOWER  EXTREMITY MMT:    MMT Right Eval Left Eval  Hip flexion 2- 2-  Hip extension    Hip abduction 2+ 2+  Hip adduction 2+ 2+  Hip internal rotation    Hip external rotation  Knee flexion    Knee extension 2- 2+  Ankle dorsiflexion 3+ 3+  Ankle plantarflexion    Ankle inversion    Ankle eversion    (Blank rows = not tested)  TRANSFERS: Assistive device utilized: Environmental consultant - 2 wheeled  Sit to stand: Max A  GAIT: Level of assistance: Max A as wife reports Comments: unable to assess. Wife reports he takes a few steps in the room with walker  FUNCTIONAL TESTS:  5 times sit to stand: unable to do  12/13/22 TUG = 36 seconds   TODAY'S TREATMENT:                                                                                                                              DATE:  12/20/22 Nustep level 5 x 7 minutes Leg curls 45# 3x10 Leg ext 10# 3 x10 15# rows and extension in sitting 15# biceps curls in chair Leg press 40# 2x10, 60# x10 Gait with CGA using the walker 126feet and very tired  12/18/22 Nustep level 5 x 7 minutes Seated Row and straight arm extensions 10# in chair with arms LEg extension 5# cues for TKE 3x10 LEg curls 35# 3x10 Leg press 40# 3x10 Chest press 10# 2x10 some left shoulder pain Gait with gait belt, FWW 170 feet  12/13/22 In w/c rows and extension 2x10 each Nustep level 5 x 6 minutes Leg curls 35# 3x10 Leg extension 5# some cues for full ROM 3x10 TUG 36 seconds Ball in lap isometric abs Seated unsupported volley ball Gait with FWW x100 feet 2x  12/11/22 NuStep L 4 x 6 min S2S from elevated surface w/UE 2x10 Seated rows R theratube with handles 2x15 Standing march w/ RW two sets  LAQ no weight  HS curls green 2x15 Seated volleyball Gait 8 ft no AD decrease step length and height   11/20/22 Nustep level 3 x 5 minutes UE and LE then 1.5 minutes with LE only 1 set 10 in W/C straight arm pulls and rows 10# and 5# Seated volleyball Standing with  walker marches Gait with w/c 100 feet with w/c follow LAQ no weight  Transfers to and from the car Mod/Max A  11/13/22-EVAL    PATIENT EDUCATION: Education details: POC and HEP  Person educated: Patient and Spouse Education method: Explanation Education comprehension: verbalized understanding  HOME EXERCISE PROGRAM: Access Code: [email or web address] URL: [email or web address] Date: 11/13/2022 Prepared by: Cassie Freer  Exercises - Seated March  - 1 x daily - 7 x weekly - 2 sets - 10 reps - Seated Knee Extension with Anchored Resistance  - 1 x daily - 7 x weekly - 2 sets - 10 reps - Seated Hip Abduction with Resistance  - 1 x daily - 7 x weekly - 2 sets - 10 reps - Seated Hip Adduction Isometrics with Ball  - 1 x daily - 7 x weekly - 2 sets - 10 reps - Seated Shoulder Horizontal Abduction with  Resistance  - 1 x daily - 7 x weekly - 2 sets - 10 reps  GOALS: Goals reviewed with patient? No  SHORT TERM GOALS: Target date: 12/25/22  Patient will be independent with initial HEP. Goal status: Progressing  2.  Patient will demonstrate improved sitting posture in wheelchair Baseline: increased kyphosis and flexed trunk Goal status: progressing 12/13/22  3.  Patient will be modA with transfers Baseline: maxA Goal status: met 12/13/22   LONG TERM GOALS: Target date: 02/05/23  Patient will be independent with advanced/ongoing HEP to improve outcomes and carryover.  Goal status: INITIAL  2.  Patient will demonstrate improved functional LE strength as demonstrated by 4 or better. Baseline: 2/5  Goal status: INITIAL  3.  Patient will be independent with sit to stand from wheelchair Baseline: maxA Goal status: progressing 12/18/22  4.  Patient will be able to take at least 5 steps with rolling walker minA Baseline: per wife can take a few steps with modA Goal status: INITIAL   ASSESSMENT:  CLINICAL IMPRESSION: Patient doing well, you do have to give him time to process and  respond.  I continue to add more exercises for overall fitness and strength,  I added some weight to most of the exercises and he did well but I could tll he was more fatigued with some mm shaking  OBJECTIVE IMPAIRMENTS: decreased activity tolerance, decreased coordination, decreased endurance, decreased knowledge of condition, decreased mobility, difficulty walking, decreased strength, improper body mechanics, postural dysfunction, and obesity.   ACTIVITY LIMITATIONS: sitting, standing, squatting, stairs, transfers, bed mobility, bathing, toileting, dressing, hygiene/grooming, and locomotion level  PERSONAL FACTORS: Age, Behavior pattern, Fitness, Past/current experiences, Time since onset of injury/illness/exacerbation, and 3+ comorbidities: hx of stroke, heart and kidney transplant, hx of falls, dementia, DM, obesity, ESRD, Afib  are also affecting patient's functional outcome.   REHAB POTENTIAL: Fair    CLINICAL DECISION MAKING: Evolving/moderate complexity  EVALUATION COMPLEXITY: Moderate   PLAN:  PT FREQUENCY: 2x/week  PT DURATION: 12 weeks  PLANNED INTERVENTIONS: Therapeutic exercises, Therapeutic activity, Neuromuscular re-education, Balance training, Gait training, Patient/Family education, Self Care, Joint mobilization, Stair training, Wheelchair mobility training, Cryotherapy, Moist heat, and Manual therapy  PLAN FOR NEXT SESSION: strengthening in wheelchair for LE and UE, can work on transfers    [employer], PT 12/20/2022, 4:14 PM

## 2022-12-21 DIAGNOSIS — D631 Anemia in chronic kidney disease: Secondary | ICD-10-CM | POA: Diagnosis not present

## 2022-12-21 DIAGNOSIS — D509 Iron deficiency anemia, unspecified: Secondary | ICD-10-CM | POA: Diagnosis not present

## 2022-12-21 DIAGNOSIS — Z992 Dependence on renal dialysis: Secondary | ICD-10-CM | POA: Diagnosis not present

## 2022-12-21 DIAGNOSIS — N186 End stage renal disease: Secondary | ICD-10-CM | POA: Diagnosis not present

## 2022-12-21 DIAGNOSIS — E1129 Type 2 diabetes mellitus with other diabetic kidney complication: Secondary | ICD-10-CM | POA: Diagnosis not present

## 2022-12-21 DIAGNOSIS — N2581 Secondary hyperparathyroidism of renal origin: Secondary | ICD-10-CM | POA: Diagnosis not present

## 2022-12-24 DIAGNOSIS — N186 End stage renal disease: Secondary | ICD-10-CM | POA: Diagnosis not present

## 2022-12-24 DIAGNOSIS — N2581 Secondary hyperparathyroidism of renal origin: Secondary | ICD-10-CM | POA: Diagnosis not present

## 2022-12-24 DIAGNOSIS — D631 Anemia in chronic kidney disease: Secondary | ICD-10-CM | POA: Diagnosis not present

## 2022-12-24 DIAGNOSIS — Z992 Dependence on renal dialysis: Secondary | ICD-10-CM | POA: Diagnosis not present

## 2022-12-24 DIAGNOSIS — E1129 Type 2 diabetes mellitus with other diabetic kidney complication: Secondary | ICD-10-CM | POA: Diagnosis not present

## 2022-12-24 DIAGNOSIS — D509 Iron deficiency anemia, unspecified: Secondary | ICD-10-CM | POA: Diagnosis not present

## 2022-12-26 DIAGNOSIS — N186 End stage renal disease: Secondary | ICD-10-CM | POA: Diagnosis not present

## 2022-12-26 DIAGNOSIS — D631 Anemia in chronic kidney disease: Secondary | ICD-10-CM | POA: Diagnosis not present

## 2022-12-26 DIAGNOSIS — Z992 Dependence on renal dialysis: Secondary | ICD-10-CM | POA: Diagnosis not present

## 2022-12-26 DIAGNOSIS — T8620 Unspecified complication of heart transplant: Secondary | ICD-10-CM | POA: Diagnosis not present

## 2022-12-26 DIAGNOSIS — N2581 Secondary hyperparathyroidism of renal origin: Secondary | ICD-10-CM | POA: Diagnosis not present

## 2022-12-26 DIAGNOSIS — E1129 Type 2 diabetes mellitus with other diabetic kidney complication: Secondary | ICD-10-CM | POA: Diagnosis not present

## 2022-12-26 DIAGNOSIS — D509 Iron deficiency anemia, unspecified: Secondary | ICD-10-CM | POA: Diagnosis not present

## 2022-12-27 ENCOUNTER — Encounter: Payer: Self-pay | Admitting: Physical Therapy

## 2022-12-27 ENCOUNTER — Ambulatory Visit: Payer: Medicare Other | Attending: Cardiology | Admitting: Physical Therapy

## 2022-12-27 DIAGNOSIS — R2681 Unsteadiness on feet: Secondary | ICD-10-CM | POA: Diagnosis not present

## 2022-12-27 DIAGNOSIS — R2689 Other abnormalities of gait and mobility: Secondary | ICD-10-CM | POA: Insufficient documentation

## 2022-12-27 DIAGNOSIS — Z9181 History of falling: Secondary | ICD-10-CM | POA: Insufficient documentation

## 2022-12-27 DIAGNOSIS — M6281 Muscle weakness (generalized): Secondary | ICD-10-CM

## 2022-12-27 DIAGNOSIS — G8929 Other chronic pain: Secondary | ICD-10-CM | POA: Diagnosis not present

## 2022-12-27 DIAGNOSIS — M545 Low back pain, unspecified: Secondary | ICD-10-CM | POA: Diagnosis not present

## 2022-12-27 DIAGNOSIS — R293 Abnormal posture: Secondary | ICD-10-CM | POA: Diagnosis not present

## 2022-12-27 DIAGNOSIS — R5381 Other malaise: Secondary | ICD-10-CM

## 2022-12-27 NOTE — Therapy (Signed)
OUTPATIENT PHYSICAL THERAPY NEURO TREATMENT   Patient Name: [name] MRN: [id number] DOB:[birth year], 62 y.o., male Today's Date: 12/27/2022   PCP: Merri Brunette REFERRING PROVIDER: Jetta Lout  END OF SESSION:  PT End of Session - 12/27/22 1344     Visit Number 7    Date for PT Re-Evaluation 02/05/23    PT Start Time 1345    PT Stop Time 1430    PT Time Calculation (min) 45 min    Activity Tolerance Patient tolerated treatment well    Behavior During Therapy WFL for tasks assessed/performed             Past Medical History:  Diagnosis Date   Arthritis    Atrial fibrillation (HCC)    CHF (congestive heart failure), NYHA class III (HCC)    s/p heart transplant   Chronic kidney disease    end stage   Chronic systolic dysfunction of left ventricle    CVA (cerebral infarction)    Diabetes mellitus    PMH; Prior to heart transplant   Family history of coronary artery disease    in both parents   GERD (gastroesophageal reflux disease)    Hyperlipidemia    Hypertension    Left bundle branch block    Morbid obesity (HCC)    status post lap band   Myocardial infarction [facility])    prior to heart transplant   Nonischemic cardiomyopathy (HCC)    prior to heart transplant   Obesity (BMI 30-39.9)    Obstructive sleep apnea    no longer needs CPAP after heart transplant per pt   Premature ventricular contractions    Prostate cancer (HCC)    SOB (shortness of breath)    Past Surgical History:  Procedure Laterality Date   CARDIAC PACEMAKER PLACEMENT  09/21/2009   Biventricular implantable cardioverter-defibrillator implantation      COLONOSCOPY W/ BIOPSIES AND POLYPECTOMY     CYSTOSCOPY WITH FULGERATION N/A 04/27/2021   Procedure: CYSTOSCOPY AND CLOT EVACUATION WITH BLADDER BIOPSY/ FUGARATION OF BLADDER/ FULGARATION OF PROSTATE ;  Surgeon: Jerilee Field, MD;  Location: [facility];  Service: Urology;  Laterality: N/A;   FOOT SURGERY     left   HEART TRANSPLANT   2014   LAPAROSCOPIC GASTRIC BANDING  01/27/2007   LEFT VENTRICULAR ASSIST DEVICE     implanted at [facility]   PACEMAKER REMOVAL     PROSTATE BIOPSY     TOTAL HIP ARTHROPLASTY Right 07/22/2017   Procedure: RIGHT TOTAL HIP ARTHROPLASTY ANTERIOR APPROACH;  Surgeon: Samson Frederic, MD;  Location: [facility];  Service: Orthopedics;  Laterality: Right;  Needs RNFA   TOTAL KNEE ARTHROPLASTY Right 07/22/2017   Patient Active Problem List   Diagnosis Date Noted   AMS (altered mental status) 11/29/2022   Aspiration pneumonia (HCC) 11/29/2022   PNA (pneumonia) 11/29/2022   Peripheral autonomic neuropathy due to DM (HCC) 11/01/2022   Orthostatic hypotension 11/01/2022   Acute encephalopathy 07/04/2022   Chronic kidney disease, stage 5 (HCC) 11/14/2021   Gait abnormality 10/12/2021   Polyneuropathy associated with underlying disease (HCC) 10/12/2021   Neuropathic pain 10/12/2021   Morbid obesity (HCC) 11/19/2019   Sleep disorder 10/22/2019   Sleep disturbance 10/22/2019   Abnormality of gait 06/09/2019   Diabetic polyneuropathy associated with diabetes mellitus due to underlying condition (HCC) 04/14/2019   Peripheral neuropathy 12/11/2018   CVA (cerebral vascular accident) (HCC) 11/09/2018   H/O heart transplant (HCC) 11/09/2018   Malignant neoplasm of prostate (HCC) 05/06/2018  Closed posterior dislocation of hip, right, initial encounter (HCC) 08/24/2017   End stage renal disease on dialysis (HCC) 08/24/2017   Hip dislocation, right (HCC) 08/24/2017   Osteoarthritis of right hip 07/22/2017   VENTRICULAR TACHYCARDIA 12/21/2009   Dyslipidemia 08/30/2009   Essential hypertension 08/30/2009   ISCHEMIC CARDIOMYOPATHY 08/30/2009   Atrial fibrillation (HCC) 08/30/2009   VENTRICULAR ECTOPY 08/30/2009   History of CVA (cerebrovascular accident) 08/30/2009   Obesity, Class III, BMI 40-49.9 (morbid obesity) (HCC) 08/30/2009   DM type 2 with diabetic peripheral neuropathy (HCC) 10/17/2007   OBSTRUCTIVE  SLEEP APNEA 10/17/2007   CARDIOMYOPATHY, DILATED 10/17/2007    ONSET DATE: 10/31/22  REFERRING DIAG:  D84.9 (ICD-10-CM) - Immunodeficiency, unspecified Z94.1 (ICD-10-CM) - Heart transplant status Z48.298 (ICD-10-CM) - Encounter for aftercare following other organ transplant Z79.899 (ICD-10-CM) - Other long term (current) drug therapy G20.C (ICD-10-CM) - Parkinsonism, unspecified  THERAPY DIAG:  Physical deconditioning  Abnormal posture  Muscle weakness (generalized)  Unsteadiness on feet  History of falling  Rationale for Evaluation and Treatment: Rehabilitation  SUBJECTIVE:                                                                                                                                                                                             SUBJECTIVE STATEMENT: Reports very tired with the PT, a little sore mms  Pt accompanied by: significant other  PERTINENT HISTORY: heart transplant 7 years ago  On dialysis MWF, heart transplant 6 yrs ago, ESRD, prostate CA, OSA, obesity, CHF, GERD, had DM, CVA with residual left hand weakness, peripheral neuropathy, R THA, AFib   PAIN:  Are you having pain? No  PRECAUTIONS: None  WEIGHT BEARING RESTRICTIONS: No  FALLS: Has patient fallen in last 6 months? Yes. Number of falls more than 10  LIVING ENVIRONMENT: Lives with: lives with their family Lives in: House/apartment Stairs: No Has following equipment at home: Environmental consultant - 2 wheeled, Wheelchair (manual), shower chair, and Grab bars  PLOF: Independent with household mobility with device and Independent with community mobility with device  PATIENT GOALS: want to be able to get him to stand and be more mobile and build his strength  OBJECTIVE:   COGNITION: Overall cognitive status: Impaired and History of cognitive impairments - at baseline   SENSATION: WFL   POSTURE: rounded shoulders, forward head, increased thoracic kyphosis, and flexed trunk    LOWER EXTREMITY ROM:   limited with all motions due to weakness   LOWER EXTREMITY MMT:    MMT Right Eval Left Eval  Hip flexion 2- 2-  Hip extension    Hip abduction 2+ 2+  Hip adduction 2+ 2+  Hip internal rotation    Hip external rotation    Knee flexion    Knee extension 2- 2+  Ankle dorsiflexion 3+ 3+  Ankle plantarflexion    Ankle inversion    Ankle eversion    (Blank rows = not tested)  TRANSFERS: Assistive device utilized: Environmental consultant - 2 wheeled  Sit to stand: Max A  GAIT: Level of assistance: Max A as wife reports Comments: unable to assess. Wife reports he takes a few steps in the room with walker  FUNCTIONAL TESTS:  5 times sit to stand: unable to do  12/13/22 TUG = 36 seconds   TODAY'S TREATMENT:                                                                                                                              DATE:  12/27/22 S2S 2x10 elevated surface with UE assist rest during second set NuStep L 5 x 7 min  Rows and Ext blue Standing 2x15 Seated volleyball Leg curls 45# 3x10 Leg ext 10# 3 x10 Standing with reaches   12/20/22 Nustep level 5 x 7 minutes Leg curls 45# 3x10 Leg ext 10# 3 x10 15# rows and extension in sitting 15# biceps curls in chair Leg press 40# 2x10, 60# x10 Gait with CGA using the walker 143feet and very tired  12/18/22 Nustep level 5 x 7 minutes Seated Row and straight arm extensions 10# in chair with arms LEg extension 5# cues for TKE 3x10 LEg curls 35# 3x10 Leg press 40# 3x10 Chest press 10# 2x10 some left shoulder pain Gait with gait belt, FWW 170 feet  12/13/22 In w/c rows and extension 2x10 each Nustep level 5 x 6 minutes Leg curls 35# 3x10 Leg extension 5# some cues for full ROM 3x10 TUG 36 seconds Ball in lap isometric abs Seated unsupported volley ball Gait with FWW x100 feet 2x  12/11/22 NuStep L 4 x 6 min S2S from elevated surface w/UE 2x10 Seated rows R theratube with handles 2x15 Standing march  w/ RW two sets  LAQ no weight  HS curls green 2x15 Seated volleyball Gait 8 ft no AD decrease step length and height   11/20/22 Nustep level 3 x 5 minutes UE and LE then 1.5 minutes with LE only 1 set 10 in W/C straight arm pulls and rows 10# and 5# Seated volleyball Standing with walker marches Gait with w/c 100 feet with w/c follow LAQ no weight  Transfers to and from the car Mod/Max A  11/13/22-EVAL    PATIENT EDUCATION: Education details: POC and HEP  Person educated: Patient and Spouse Education method: Explanation Education comprehension: verbalized understanding  HOME EXERCISE PROGRAM: Access Code: [email or web address] URL: [email or web address] Date: 11/13/2022 Prepared by: Cassie Freer  Exercises - Seated March  - 1 x daily - 7 x weekly - 2 sets - 10 reps - Seated Knee Extension with Anchored Resistance  - 1 x daily -  7 x weekly - 2 sets - 10 reps - Seated Hip Abduction with Resistance  - 1 x daily - 7 x weekly - 2 sets - 10 reps - Seated Hip Adduction Isometrics with Ball  - 1 x daily - 7 x weekly - 2 sets - 10 reps - Seated Shoulder Horizontal Abduction with Resistance  - 1 x daily - 7 x weekly - 2 sets - 10 reps  GOALS: Goals reviewed with patient? No  SHORT TERM GOALS: Target date: 12/25/22  Patient will be independent with initial HEP. Goal status: Progressing  2.  Patient will demonstrate improved sitting posture in wheelchair Baseline: increased kyphosis and flexed trunk Goal status: progressing 12/13/22  3.  Patient will be modA with transfers Baseline: maxA Goal status: met 12/13/22   LONG TERM GOALS: Target date: 02/05/23  Patient will be independent with advanced/ongoing HEP to improve outcomes and carryover.  Goal status: INITIAL  2.  Patient will demonstrate improved functional LE strength as demonstrated by 4 or better. Baseline: 2/5  Goal status: INITIAL  3.  Patient will be independent with sit to stand from wheelchair Baseline:  maxA Goal status: progressing 12/18/22  4.  Patient will be able to take at least 5 steps with rolling walker minA Baseline: per wife can take a few steps with modA Goal status: INITIAL   ASSESSMENT:  CLINICAL IMPRESSION: Patient doing well, he enters in a powered WC.  you do have to give him time to process and respond.  I continue to add more exercises for overall fitness and strength,  I added more interventions in the standing position. Pt ambulated with RW during session. Additional fatigue with the standing interventions.   OBJECTIVE IMPAIRMENTS: decreased activity tolerance, decreased coordination, decreased endurance, decreased knowledge of condition, decreased mobility, difficulty walking, decreased strength, improper body mechanics, postural dysfunction, and obesity.   ACTIVITY LIMITATIONS: sitting, standing, squatting, stairs, transfers, bed mobility, bathing, toileting, dressing, hygiene/grooming, and locomotion level  PERSONAL FACTORS: Age, Behavior pattern, Fitness, Past/current experiences, Time since onset of injury/illness/exacerbation, and 3+ comorbidities: hx of stroke, heart and kidney transplant, hx of falls, dementia, DM, obesity, ESRD, Afib  are also affecting patient's functional outcome.   REHAB POTENTIAL: Fair    CLINICAL DECISION MAKING: Evolving/moderate complexity  EVALUATION COMPLEXITY: Moderate   PLAN:  PT FREQUENCY: 2x/week  PT DURATION: 12 weeks  PLANNED INTERVENTIONS: Therapeutic exercises, Therapeutic activity, Neuromuscular re-education, Balance training, Gait training, Patient/Family education, Self Care, Joint mobilization, Stair training, Wheelchair mobility training, Cryotherapy, Moist heat, and Manual therapy  PLAN FOR NEXT SESSION: strengthening in wheelchair for LE and UE, can work on transfers    [employer], PTA 12/27/2022, 1:45 PM

## 2022-12-28 DIAGNOSIS — E1129 Type 2 diabetes mellitus with other diabetic kidney complication: Secondary | ICD-10-CM | POA: Diagnosis not present

## 2022-12-28 DIAGNOSIS — N186 End stage renal disease: Secondary | ICD-10-CM | POA: Diagnosis not present

## 2022-12-28 DIAGNOSIS — D509 Iron deficiency anemia, unspecified: Secondary | ICD-10-CM | POA: Diagnosis not present

## 2022-12-28 DIAGNOSIS — N2581 Secondary hyperparathyroidism of renal origin: Secondary | ICD-10-CM | POA: Diagnosis not present

## 2022-12-28 DIAGNOSIS — Z992 Dependence on renal dialysis: Secondary | ICD-10-CM | POA: Diagnosis not present

## 2022-12-28 DIAGNOSIS — D631 Anemia in chronic kidney disease: Secondary | ICD-10-CM | POA: Diagnosis not present

## 2022-12-31 DIAGNOSIS — N186 End stage renal disease: Secondary | ICD-10-CM | POA: Diagnosis not present

## 2022-12-31 DIAGNOSIS — D631 Anemia in chronic kidney disease: Secondary | ICD-10-CM | POA: Diagnosis not present

## 2022-12-31 DIAGNOSIS — E1129 Type 2 diabetes mellitus with other diabetic kidney complication: Secondary | ICD-10-CM | POA: Diagnosis not present

## 2022-12-31 DIAGNOSIS — N2581 Secondary hyperparathyroidism of renal origin: Secondary | ICD-10-CM | POA: Diagnosis not present

## 2022-12-31 DIAGNOSIS — D509 Iron deficiency anemia, unspecified: Secondary | ICD-10-CM | POA: Diagnosis not present

## 2022-12-31 DIAGNOSIS — Z992 Dependence on renal dialysis: Secondary | ICD-10-CM | POA: Diagnosis not present

## 2023-01-01 ENCOUNTER — Ambulatory Visit: Payer: Medicare Other | Admitting: Physical Therapy

## 2023-01-01 ENCOUNTER — Encounter: Payer: Self-pay | Admitting: Physical Therapy

## 2023-01-01 DIAGNOSIS — R2681 Unsteadiness on feet: Secondary | ICD-10-CM

## 2023-01-01 DIAGNOSIS — M6281 Muscle weakness (generalized): Secondary | ICD-10-CM

## 2023-01-01 DIAGNOSIS — Z9181 History of falling: Secondary | ICD-10-CM

## 2023-01-01 DIAGNOSIS — R293 Abnormal posture: Secondary | ICD-10-CM

## 2023-01-01 DIAGNOSIS — R5381 Other malaise: Secondary | ICD-10-CM

## 2023-01-01 DIAGNOSIS — R2689 Other abnormalities of gait and mobility: Secondary | ICD-10-CM | POA: Diagnosis not present

## 2023-01-01 NOTE — Therapy (Signed)
OUTPATIENT PHYSICAL THERAPY NEURO TREATMENT   Patient Name: [name] MRN: [id number] DOB:06/18/1961, 62 y.o., male Today's Date: 01/01/2023   PCP: Merri Brunette REFERRING PROVIDER: Jetta Lout  END OF SESSION:  PT End of Session - 01/01/23 1054     Visit Number 8    Number of Visits 17    Date for PT Re-Evaluation 02/05/23    Authorization Type Medicare    PT Start Time 1054    PT Stop Time 1142    PT Time Calculation (min) 48 min    Equipment Utilized During Treatment Gait belt    Activity Tolerance Patient tolerated treatment well    Behavior During Therapy WFL for tasks assessed/performed             Past Medical History:  Diagnosis Date   Arthritis    Atrial fibrillation (HCC)    CHF (congestive heart failure), NYHA class III (HCC)    s/p heart transplant   Chronic kidney disease    end stage   Chronic systolic dysfunction of left ventricle    CVA (cerebral infarction)    Diabetes mellitus    PMH; Prior to heart transplant   Family history of coronary artery disease    in both parents   GERD (gastroesophageal reflux disease)    Hyperlipidemia    Hypertension    Left bundle branch block    Morbid obesity (HCC)    status post lap band   Myocardial infarction [facility])    prior to heart transplant   Nonischemic cardiomyopathy (HCC)    prior to heart transplant   Obesity (BMI 30-39.9)    Obstructive sleep apnea    no longer needs CPAP after heart transplant per pt   Premature ventricular contractions    Prostate cancer (HCC)    SOB (shortness of breath)    Past Surgical History:  Procedure Laterality Date   CARDIAC PACEMAKER PLACEMENT  09/21/2009   Biventricular implantable cardioverter-defibrillator implantation      COLONOSCOPY W/ BIOPSIES AND POLYPECTOMY     CYSTOSCOPY WITH FULGERATION N/A 04/27/2021   Procedure: CYSTOSCOPY AND CLOT EVACUATION WITH BLADDER BIOPSY/ FUGARATION OF BLADDER/ FULGARATION OF PROSTATE ;  Surgeon: Jerilee Field,  MD;  Location: [facility];  Service: Urology;  Laterality: N/A;   FOOT SURGERY     left   HEART TRANSPLANT  2014   LAPAROSCOPIC GASTRIC BANDING  01/27/2007   LEFT VENTRICULAR ASSIST DEVICE     implanted at [facility]   PACEMAKER REMOVAL     PROSTATE BIOPSY     TOTAL HIP ARTHROPLASTY Right 07/22/2017   Procedure: RIGHT TOTAL HIP ARTHROPLASTY ANTERIOR APPROACH;  Surgeon: Samson Frederic, MD;  Location: [facility];  Service: Orthopedics;  Laterality: Right;  Needs RNFA   TOTAL KNEE ARTHROPLASTY Right 07/22/2017   Patient Active Problem List   Diagnosis Date Noted   AMS (altered mental status) 11/29/2022   Aspiration pneumonia (HCC) 11/29/2022   PNA (pneumonia) 11/29/2022   Peripheral autonomic neuropathy due to DM (HCC) 11/01/2022   Orthostatic hypotension 11/01/2022   Acute encephalopathy 07/04/2022   Chronic kidney disease, stage 5 (HCC) 11/14/2021   Gait abnormality 10/12/2021   Polyneuropathy associated with underlying disease (HCC) 10/12/2021   Neuropathic pain 10/12/2021   Morbid obesity (HCC) 11/19/2019   Sleep disorder 10/22/2019   Sleep disturbance 10/22/2019   Abnormality of gait 06/09/2019   Diabetic polyneuropathy associated with diabetes mellitus due to underlying condition (HCC) 04/14/2019   Peripheral neuropathy 12/11/2018   CVA (  cerebral vascular accident) (HCC) 11/09/2018   H/O heart transplant (HCC) 11/09/2018   Malignant neoplasm of prostate (HCC) 05/06/2018   Closed posterior dislocation of hip, right, initial encounter (HCC) 08/24/2017   End stage renal disease on dialysis (HCC) 08/24/2017   Hip dislocation, right (HCC) 08/24/2017   Osteoarthritis of right hip 07/22/2017   VENTRICULAR TACHYCARDIA 12/21/2009   Dyslipidemia 08/30/2009   Essential hypertension 08/30/2009   ISCHEMIC CARDIOMYOPATHY 08/30/2009   Atrial fibrillation (HCC) 08/30/2009   VENTRICULAR ECTOPY 08/30/2009   History of CVA (cerebrovascular accident) 08/30/2009   Obesity, Class III, BMI 40-49.9 (morbid  obesity) (HCC) 08/30/2009   DM type 2 with diabetic peripheral neuropathy (HCC) 10/17/2007   OBSTRUCTIVE SLEEP APNEA 10/17/2007   CARDIOMYOPATHY, DILATED 10/17/2007    ONSET DATE: 10/31/22  REFERRING DIAG:  D84.9 (ICD-10-CM) - Immunodeficiency, unspecified Z94.1 (ICD-10-CM) - Heart transplant status Z48.298 (ICD-10-CM) - Encounter for aftercare following other organ transplant Z79.899 (ICD-10-CM) - Other long term (current) drug therapy G20.C (ICD-10-CM) - Parkinsonism, unspecified  THERAPY DIAG:  Physical deconditioning  Abnormal posture  Muscle weakness (generalized)  Unsteadiness on feet  History of falling  Rationale for Evaluation and Treatment: Rehabilitation  SUBJECTIVE:                                                                                                                                                                                             SUBJECTIVE STATEMENT: Reports very tired after PT sessions  Pt accompanied by: significant other  PERTINENT HISTORY: heart transplant 7 years ago  On dialysis MWF, heart transplant 6 yrs ago, ESRD, prostate CA, OSA, obesity, CHF, GERD, had DM, CVA with residual left hand weakness, peripheral neuropathy, R THA, AFib   PAIN:  Are you having pain? No  PRECAUTIONS: None  WEIGHT BEARING RESTRICTIONS: No  FALLS: Has patient fallen in last 6 months? Yes. Number of falls more than 10  LIVING ENVIRONMENT: Lives with: lives with their family Lives in: House/apartment Stairs: No Has following equipment at home: Environmental consultant - 2 wheeled, Wheelchair (manual), shower chair, and Grab bars  PLOF: Independent with household mobility with device and Independent with community mobility with device  PATIENT GOALS: want to be able to get him to stand and be more mobile and build his strength  OBJECTIVE:   COGNITION: Overall cognitive status: Impaired and History of cognitive impairments - at  baseline   SENSATION: WFL   POSTURE: rounded shoulders, forward head, increased thoracic kyphosis, and flexed trunk   LOWER EXTREMITY ROM:   limited with all motions due to weakness   LOWER EXTREMITY MMT:    MMT Right Eval  Left Eval  Hip flexion 2- 2-  Hip extension    Hip abduction 2+ 2+  Hip adduction 2+ 2+  Hip internal rotation    Hip external rotation    Knee flexion    Knee extension 2- 2+  Ankle dorsiflexion 3+ 3+  Ankle plantarflexion    Ankle inversion    Ankle eversion    (Blank rows = not tested)  TRANSFERS: Assistive device utilized: Environmental consultant - 2 wheeled  Sit to stand: Max A  GAIT: Level of assistance: Max A as wife reports Comments: unable to assess. Wife reports he takes a few steps in the room with walker  FUNCTIONAL TESTS:  5 times sit to stand: unable to do  12/13/22 TUG = 36 seconds , 01/01/23 TUG = 26 seconds   TODAY'S TREATMENT:                                                                                                                              DATE:  01/01/23 10# row and straight arm pulls 2x10 each Nustep level 5 x 8 minutes 35# leg curls 3x10 5# leg extension 3x10 2# standing marches with walker 2# standing hip abduction with walker TUG 26 seconds with FWW Blue tband seated clamshells Standing volleyball back of knees touching the mat table Gait with CGA 200 feet  12/27/22 S2S 2x10 elevated surface with UE assist rest during second set NuStep L 5 x 7 min  Rows and Ext blue Standing 2x15 Seated volleyball Leg curls 45# 3x10 Leg ext 10# 3 x10 Standing with reaches   12/20/22 Nustep level 5 x 7 minutes Leg curls 45# 3x10 Leg ext 10# 3 x10 15# rows and extension in sitting 15# biceps curls in chair Leg press 40# 2x10, 60# x10 Gait with CGA using the walker 14feet and very tired  12/18/22 Nustep level 5 x 7 minutes Seated Row and straight arm extensions 10# in chair with arms LEg extension 5# cues for TKE 3x10 LEg curls  35# 3x10 Leg press 40# 3x10 Chest press 10# 2x10 some left shoulder pain Gait with gait belt, FWW 170 feet  12/13/22 In w/c rows and extension 2x10 each Nustep level 5 x 6 minutes Leg curls 35# 3x10 Leg extension 5# some cues for full ROM 3x10 TUG 36 seconds Ball in lap isometric abs Seated unsupported volley ball Gait with FWW x100 feet 2x  12/11/22 NuStep L 4 x 6 min S2S from elevated surface w/UE 2x10 Seated rows R theratube with handles 2x15 Standing march w/ RW two sets  LAQ no weight  HS curls green 2x15 Seated volleyball Gait 8 ft no AD decrease step length and height   11/20/22 Nustep level 3 x 5 minutes UE and LE then 1.5 minutes with LE only 1 set 10 in W/C straight arm pulls and rows 10# and 5# Seated volleyball Standing with walker marches Gait with w/c 100 feet with w/c follow LAQ no weight  Transfers to and from the car Mod/Max A  11/13/22-EVAL    PATIENT EDUCATION: Education details: POC and HEP  Person educated: Patient and Spouse Education method: Explanation Education comprehension: verbalized understanding  HOME EXERCISE PROGRAM: Access Code: [email or web address] URL: [email or web address] Date: 11/13/2022 Prepared by: Cassie Freer  Exercises - Seated March  - 1 x daily - 7 x weekly - 2 sets - 10 reps - Seated Knee Extension with Anchored Resistance  - 1 x daily - 7 x weekly - 2 sets - 10 reps - Seated Hip Abduction with Resistance  - 1 x daily - 7 x weekly - 2 sets - 10 reps - Seated Hip Adduction Isometrics with Ball  - 1 x daily - 7 x weekly - 2 sets - 10 reps - Seated Shoulder Horizontal Abduction with Resistance  - 1 x daily - 7 x weekly - 2 sets - 10 reps  GOALS: Goals reviewed with patient? No  SHORT TERM GOALS: Target date: 12/25/22  Patient will be independent with initial HEP. Goal status: Progressing  2.  Patient will demonstrate improved sitting posture in wheelchair Baseline: increased kyphosis and flexed trunk Goal status:  progressing 12/13/22  3.  Patient will be modA with transfers Baseline: maxA Goal status: met 12/13/22   LONG TERM GOALS: Target date: 02/05/23  Patient will be independent with advanced/ongoing HEP to improve outcomes and carryover.  Goal status: INITIAL  2.  Patient will demonstrate improved functional LE strength as demonstrated by 4 or better. Baseline: 2/5  Goal status: INITIAL  3.  Patient will be independent with sit to stand from wheelchair Baseline: maxA Goal status: depending on seat height doing much better 01/01/23  4.  Patient will be able to take at least 5 steps with rolling walker minA Baseline: per wife can take a few steps with modA Goal status: met 01/01/23   ASSESSMENT:  CLINICAL IMPRESSION: Patient doing well,he decreased his TUG time by 10 seconds.  He is doing better with following instructions at times does need some time to process.  He was able to walk 200 feet with CGA and the FWW. Does get fatigue.  Balance without walker is poor, can take a few steps but is not steady  OBJECTIVE IMPAIRMENTS: decreased activity tolerance, decreased coordination, decreased endurance, decreased knowledge of condition, decreased mobility, difficulty walking, decreased strength, improper body mechanics, postural dysfunction, and obesity.   ACTIVITY LIMITATIONS: sitting, standing, squatting, stairs, transfers, bed mobility, bathing, toileting, dressing, hygiene/grooming, and locomotion level  PERSONAL FACTORS: Age, Behavior pattern, Fitness, Past/current experiences, Time since onset of injury/illness/exacerbation, and 3+ comorbidities: hx of stroke, heart and kidney transplant, hx of falls, dementia, DM, obesity, ESRD, Afib  are also affecting patient's functional outcome.   REHAB POTENTIAL: Fair    CLINICAL DECISION MAKING: Evolving/moderate complexity  EVALUATION COMPLEXITY: Moderate   PLAN:  PT FREQUENCY: 2x/week  PT DURATION: 12 weeks  PLANNED INTERVENTIONS:  Therapeutic exercises, Therapeutic activity, Neuromuscular re-education, Balance training, Gait training, Patient/Family education, Self Care, Joint mobilization, Stair training, Wheelchair mobility training, Cryotherapy, Moist heat, and Manual therapy  PLAN FOR NEXT SESSION: progressing to out of w/c in PT  Azhar Knope W, PT 01/01/2023, 10:55 AM

## 2023-01-02 DIAGNOSIS — N186 End stage renal disease: Secondary | ICD-10-CM | POA: Diagnosis not present

## 2023-01-02 DIAGNOSIS — E1129 Type 2 diabetes mellitus with other diabetic kidney complication: Secondary | ICD-10-CM | POA: Diagnosis not present

## 2023-01-02 DIAGNOSIS — N2581 Secondary hyperparathyroidism of renal origin: Secondary | ICD-10-CM | POA: Diagnosis not present

## 2023-01-02 DIAGNOSIS — Z992 Dependence on renal dialysis: Secondary | ICD-10-CM | POA: Diagnosis not present

## 2023-01-02 DIAGNOSIS — D509 Iron deficiency anemia, unspecified: Secondary | ICD-10-CM | POA: Diagnosis not present

## 2023-01-02 DIAGNOSIS — D631 Anemia in chronic kidney disease: Secondary | ICD-10-CM | POA: Diagnosis not present

## 2023-01-03 ENCOUNTER — Ambulatory Visit: Payer: Medicare Other | Admitting: Physical Therapy

## 2023-01-03 ENCOUNTER — Encounter: Payer: Self-pay | Admitting: Physical Therapy

## 2023-01-03 DIAGNOSIS — R5381 Other malaise: Secondary | ICD-10-CM | POA: Diagnosis not present

## 2023-01-03 DIAGNOSIS — R2681 Unsteadiness on feet: Secondary | ICD-10-CM | POA: Diagnosis not present

## 2023-01-03 DIAGNOSIS — Z9181 History of falling: Secondary | ICD-10-CM | POA: Diagnosis not present

## 2023-01-03 DIAGNOSIS — R2689 Other abnormalities of gait and mobility: Secondary | ICD-10-CM | POA: Diagnosis not present

## 2023-01-03 DIAGNOSIS — R293 Abnormal posture: Secondary | ICD-10-CM | POA: Diagnosis not present

## 2023-01-03 DIAGNOSIS — M6281 Muscle weakness (generalized): Secondary | ICD-10-CM | POA: Diagnosis not present

## 2023-01-03 NOTE — Therapy (Signed)
OUTPATIENT PHYSICAL THERAPY NEURO TREATMENT   Patient Name: [name] MRN: [id number] DOB:Feb 15, 1961, 62 y.o., male Today's Date: 01/03/2023   PCP: Merri Brunette REFERRING PROVIDER: Jetta Lout  END OF SESSION:  PT End of Session - 01/03/23 1639     Visit Number 9    Number of Visits 17    Date for PT Re-Evaluation 02/05/23    Authorization Type Medicare    PT Start Time 1633    PT Stop Time 1729    PT Time Calculation (min) 56 min    Equipment Utilized During Treatment Gait belt    Activity Tolerance Patient tolerated treatment well    Behavior During Therapy WFL for tasks assessed/performed             Past Medical History:  Diagnosis Date   Arthritis    Atrial fibrillation (HCC)    CHF (congestive heart failure), NYHA class III (HCC)    s/p heart transplant   Chronic kidney disease    end stage   Chronic systolic dysfunction of left ventricle    CVA (cerebral infarction)    Diabetes mellitus    PMH; Prior to heart transplant   Family history of coronary artery disease    in both parents   GERD (gastroesophageal reflux disease)    Hyperlipidemia    Hypertension    Left bundle branch block    Morbid obesity (HCC)    status post lap band   Myocardial infarction [facility])    prior to heart transplant   Nonischemic cardiomyopathy (HCC)    prior to heart transplant   Obesity (BMI 30-39.9)    Obstructive sleep apnea    no longer needs CPAP after heart transplant per pt   Premature ventricular contractions    Prostate cancer (HCC)    SOB (shortness of breath)    Past Surgical History:  Procedure Laterality Date   CARDIAC PACEMAKER PLACEMENT  09/21/2009   Biventricular implantable cardioverter-defibrillator implantation      COLONOSCOPY W/ BIOPSIES AND POLYPECTOMY     CYSTOSCOPY WITH FULGERATION N/A 04/27/2021   Procedure: CYSTOSCOPY AND CLOT EVACUATION WITH BLADDER BIOPSY/ FUGARATION OF BLADDER/ FULGARATION OF PROSTATE ;  Surgeon: Jerilee Field,  MD;  Location: [facility];  Service: Urology;  Laterality: N/A;   FOOT SURGERY     left   HEART TRANSPLANT  2014   LAPAROSCOPIC GASTRIC BANDING  01/27/2007   LEFT VENTRICULAR ASSIST DEVICE     implanted at [facility]   PACEMAKER REMOVAL     PROSTATE BIOPSY     TOTAL HIP ARTHROPLASTY Right 07/22/2017   Procedure: RIGHT TOTAL HIP ARTHROPLASTY ANTERIOR APPROACH;  Surgeon: Samson Frederic, MD;  Location: [facility];  Service: Orthopedics;  Laterality: Right;  Needs RNFA   TOTAL KNEE ARTHROPLASTY Right 07/22/2017   Patient Active Problem List   Diagnosis Date Noted   AMS (altered mental status) 11/29/2022   Aspiration pneumonia (HCC) 11/29/2022   PNA (pneumonia) 11/29/2022   Peripheral autonomic neuropathy due to DM (HCC) 11/01/2022   Orthostatic hypotension 11/01/2022   Acute encephalopathy 07/04/2022   Chronic kidney disease, stage 5 (HCC) 11/14/2021   Gait abnormality 10/12/2021   Polyneuropathy associated with underlying disease (HCC) 10/12/2021   Neuropathic pain 10/12/2021   Morbid obesity (HCC) 11/19/2019   Sleep disorder 10/22/2019   Sleep disturbance 10/22/2019   Abnormality of gait 06/09/2019   Diabetic polyneuropathy associated with diabetes mellitus due to underlying condition (HCC) 04/14/2019   Peripheral neuropathy 12/11/2018   CVA (  cerebral vascular accident) (HCC) 11/09/2018   H/O heart transplant (HCC) 11/09/2018   Malignant neoplasm of prostate (HCC) 05/06/2018   Closed posterior dislocation of hip, right, initial encounter (HCC) 08/24/2017   End stage renal disease on dialysis (HCC) 08/24/2017   Hip dislocation, right (HCC) 08/24/2017   Osteoarthritis of right hip 07/22/2017   VENTRICULAR TACHYCARDIA 12/21/2009   Dyslipidemia 08/30/2009   Essential hypertension 08/30/2009   ISCHEMIC CARDIOMYOPATHY 08/30/2009   Atrial fibrillation (HCC) 08/30/2009   VENTRICULAR ECTOPY 08/30/2009   History of CVA (cerebrovascular accident) 08/30/2009   Obesity, Class III, BMI 40-49.9 (morbid  obesity) (HCC) 08/30/2009   DM type 2 with diabetic peripheral neuropathy (HCC) 10/17/2007   OBSTRUCTIVE SLEEP APNEA 10/17/2007   CARDIOMYOPATHY, DILATED 10/17/2007    ONSET DATE: 10/31/22  REFERRING DIAG:  D84.9 (ICD-10-CM) - Immunodeficiency, unspecified Z94.1 (ICD-10-CM) - Heart transplant status Z48.298 (ICD-10-CM) - Encounter for aftercare following other organ transplant Z79.899 (ICD-10-CM) - Other long term (current) drug therapy G20.C (ICD-10-CM) - Parkinsonism, unspecified  THERAPY DIAG:  Physical deconditioning  Abnormal posture  Muscle weakness (generalized)  Unsteadiness on feet  History of falling  Rationale for Evaluation and Treatment: Rehabilitation  SUBJECTIVE:                                                                                                                                                                                             SUBJECTIVE STATEMENT: Feeling better today  Pt accompanied by: significant other  PERTINENT HISTORY: heart transplant 7 years ago  On dialysis MWF, heart transplant 6 yrs ago, ESRD, prostate CA, OSA, obesity, CHF, GERD, had DM, CVA with residual left hand weakness, peripheral neuropathy, R THA, AFib   PAIN:  Are you having pain? No  PRECAUTIONS: None  WEIGHT BEARING RESTRICTIONS: No  FALLS: Has patient fallen in last 6 months? Yes. Number of falls more than 10  LIVING ENVIRONMENT: Lives with: lives with their family Lives in: House/apartment Stairs: No Has following equipment at home: Environmental consultant - 2 wheeled, Wheelchair (manual), shower chair, and Grab bars  PLOF: Independent with household mobility with device and Independent with community mobility with device  PATIENT GOALS: want to be able to get him to stand and be more mobile and build his strength  OBJECTIVE:   COGNITION: Overall cognitive status: Impaired and History of cognitive impairments - at baseline   SENSATION: WFL   POSTURE: rounded  shoulders, forward head, increased thoracic kyphosis, and flexed trunk   LOWER EXTREMITY ROM:   limited with all motions due to weakness   LOWER EXTREMITY MMT:    MMT Right Eval Left Eval  Hip flexion 2- 2-  Hip extension    Hip abduction 2+ 2+  Hip adduction 2+ 2+  Hip internal rotation    Hip external rotation    Knee flexion    Knee extension 2- 2+  Ankle dorsiflexion 3+ 3+  Ankle plantarflexion    Ankle inversion    Ankle eversion    (Blank rows = not tested)  TRANSFERS: Assistive device utilized: Environmental consultant - 2 wheeled  Sit to stand: Max A  GAIT: Level of assistance: Max A as wife reports Comments: unable to assess. Wife reports he takes a few steps in the room with walker  FUNCTIONAL TESTS:  5 times sit to stand: unable to do  12/13/22 TUG = 36 seconds , 01/01/23 TUG = 26 seconds   TODAY'S TREATMENT:                                                                                                                              DATE:  01/03/23 Nustep level 5 x 9 minutes 10# row and straight arm pulls in standing Standing volleyball back of knees touching mat 35# LEg curls 3x10 10# leg extension 3x10 Gait with FWW 220 feet with SBA stopped due to back pain Blue tband clamshells Standing reaching working on balance  01/01/23 10# row and straight arm pulls 2x10 each Nustep level 5 x 8 minutes 35# leg curls 3x10 5# leg extension 3x10 2# standing marches with walker 2# standing hip abduction with walker TUG 26 seconds with FWW Blue tband seated clamshells Standing volleyball back of knees touching the mat table Gait with CGA 200 feet  12/27/22 S2S 2x10 elevated surface with UE assist rest during second set NuStep L 5 x 7 min  Rows and Ext blue Standing 2x15 Seated volleyball Leg curls 45# 3x10 Leg ext 10# 3 x10 Standing with reaches  12/20/22 Nustep level 5 x 7 minutes Leg curls 45# 3x10 Leg ext 10# 3 x10 15# rows and extension in sitting 15# biceps curls in  chair Leg press 40# 2x10, 60# x10 Gait with CGA using the walker 127feet and very tired  12/18/22 Nustep level 5 x 7 minutes Seated Row and straight arm extensions 10# in chair with arms LEg extension 5# cues for TKE 3x10 LEg curls 35# 3x10 Leg press 40# 3x10 Chest press 10# 2x10 some left shoulder pain Gait with gait belt, FWW 170 feet  12/13/22 In w/c rows and extension 2x10 each Nustep level 5 x 6 minutes Leg curls 35# 3x10 Leg extension 5# some cues for full ROM 3x10 TUG 36 seconds Ball in lap isometric abs Seated unsupported volley ball Gait with FWW x100 feet 2x  12/11/22 NuStep L 4 x 6 min S2S from elevated surface w/UE 2x10 Seated rows R theratube with handles 2x15 Standing march w/ RW two sets  LAQ no weight  HS curls green 2x15 Seated volleyball Gait 8 ft no AD decrease step length and height  11/20/22 Nustep level 3 x 5 minutes UE and LE then 1.5 minutes with LE only 1 set 10 in W/C straight arm pulls and rows 10# and 5# Seated volleyball Standing with walker marches Gait with w/c 100 feet with w/c follow LAQ no weight  Transfers to and from the car Mod/Max A  11/13/22-EVAL    PATIENT EDUCATION: Education details: POC and HEP  Person educated: Patient and Spouse Education method: Explanation Education comprehension: verbalized understanding  HOME EXERCISE PROGRAM: Access Code: [email or web address] URL: [email or web address] Date: 11/13/2022 Prepared by: Cassie Freer  Exercises - Seated March  - 1 x daily - 7 x weekly - 2 sets - 10 reps - Seated Knee Extension with Anchored Resistance  - 1 x daily - 7 x weekly - 2 sets - 10 reps - Seated Hip Abduction with Resistance  - 1 x daily - 7 x weekly - 2 sets - 10 reps - Seated Hip Adduction Isometrics with Ball  - 1 x daily - 7 x weekly - 2 sets - 10 reps - Seated Shoulder Horizontal Abduction with Resistance  - 1 x daily - 7 x weekly - 2 sets - 10 reps  GOALS: Goals reviewed with patient? No  SHORT  TERM GOALS: Target date: 12/25/22  Patient will be independent with initial HEP. Goal status: met 01/03/23  2.  Patient will demonstrate improved sitting posture in wheelchair Baseline: increased kyphosis and flexed trunk Goal status: progressing 12/13/22  3.  Patient will be modA with transfers Baseline: maxA Goal status: met 12/13/22   LONG TERM GOALS: Target date: 02/05/23  Patient will be independent with advanced/ongoing HEP to improve outcomes and carryover.  Goal status: INITIAL  2.  Patient will demonstrate improved functional LE strength as demonstrated by 4 or better. Baseline: 2/5  Goal status: INITIAL  3.  Patient will be independent with sit to stand from wheelchair Baseline: maxA Goal status: depending on seat height doing much better 01/01/23  4.  Patient will be able to take at least 5 steps with rolling walker minA Baseline: per wife can take a few steps with modA Goal status: met 01/01/23   ASSESSMENT:  CLINICAL IMPRESSION: Patient continues to improve with his ability, did more standing today with the rows and needed only SBA, still having him have backs of legs touching the table for volleyball, he was limited with the walking today due to LBP at about 200 feet, able to go a little further  OBJECTIVE IMPAIRMENTS: decreased activity tolerance, decreased coordination, decreased endurance, decreased knowledge of condition, decreased mobility, difficulty walking, decreased strength, improper body mechanics, postural dysfunction, and obesity.   ACTIVITY LIMITATIONS: sitting, standing, squatting, stairs, transfers, bed mobility, bathing, toileting, dressing, hygiene/grooming, and locomotion level  PERSONAL FACTORS: Age, Behavior pattern, Fitness, Past/current experiences, Time since onset of injury/illness/exacerbation, and 3+ comorbidities: hx of stroke, heart and kidney transplant, hx of falls, dementia, DM, obesity, ESRD, Afib  are also affecting patient's functional  outcome.   REHAB POTENTIAL: Fair    CLINICAL DECISION MAKING: Evolving/moderate complexity  EVALUATION COMPLEXITY: Moderate   PLAN:  PT FREQUENCY: 2x/week  PT DURATION: 12 weeks  PLANNED INTERVENTIONS: Therapeutic exercises, Therapeutic activity, Neuromuscular re-education, Balance training, Gait training, Patient/Family education, Self Care, Joint mobilization, Stair training, Wheelchair mobility training, Cryotherapy, Moist heat, and Manual therapy  PLAN FOR NEXT SESSION: progressing to out of w/c in PT  Antron Seth W, PT 01/03/2023, 4:40 PM

## 2023-01-04 DIAGNOSIS — N2581 Secondary hyperparathyroidism of renal origin: Secondary | ICD-10-CM | POA: Diagnosis not present

## 2023-01-04 DIAGNOSIS — D509 Iron deficiency anemia, unspecified: Secondary | ICD-10-CM | POA: Diagnosis not present

## 2023-01-04 DIAGNOSIS — F02818 Dementia in other diseases classified elsewhere, unspecified severity, with other behavioral disturbance: Secondary | ICD-10-CM | POA: Diagnosis not present

## 2023-01-04 DIAGNOSIS — F411 Generalized anxiety disorder: Secondary | ICD-10-CM | POA: Diagnosis not present

## 2023-01-04 DIAGNOSIS — N186 End stage renal disease: Secondary | ICD-10-CM | POA: Diagnosis not present

## 2023-01-04 DIAGNOSIS — F331 Major depressive disorder, recurrent, moderate: Secondary | ICD-10-CM | POA: Diagnosis not present

## 2023-01-04 DIAGNOSIS — Z992 Dependence on renal dialysis: Secondary | ICD-10-CM | POA: Diagnosis not present

## 2023-01-04 DIAGNOSIS — D631 Anemia in chronic kidney disease: Secondary | ICD-10-CM | POA: Diagnosis not present

## 2023-01-04 DIAGNOSIS — E1129 Type 2 diabetes mellitus with other diabetic kidney complication: Secondary | ICD-10-CM | POA: Diagnosis not present

## 2023-01-07 DIAGNOSIS — E1129 Type 2 diabetes mellitus with other diabetic kidney complication: Secondary | ICD-10-CM | POA: Diagnosis not present

## 2023-01-07 DIAGNOSIS — D509 Iron deficiency anemia, unspecified: Secondary | ICD-10-CM | POA: Diagnosis not present

## 2023-01-07 DIAGNOSIS — N2581 Secondary hyperparathyroidism of renal origin: Secondary | ICD-10-CM | POA: Diagnosis not present

## 2023-01-07 DIAGNOSIS — Z992 Dependence on renal dialysis: Secondary | ICD-10-CM | POA: Diagnosis not present

## 2023-01-07 DIAGNOSIS — N186 End stage renal disease: Secondary | ICD-10-CM | POA: Diagnosis not present

## 2023-01-07 DIAGNOSIS — D631 Anemia in chronic kidney disease: Secondary | ICD-10-CM | POA: Diagnosis not present

## 2023-01-08 ENCOUNTER — Encounter: Payer: Self-pay | Admitting: Physical Therapy

## 2023-01-08 ENCOUNTER — Ambulatory Visit: Payer: Medicare Other | Admitting: Physical Therapy

## 2023-01-08 DIAGNOSIS — Z9181 History of falling: Secondary | ICD-10-CM

## 2023-01-08 DIAGNOSIS — R5381 Other malaise: Secondary | ICD-10-CM | POA: Diagnosis not present

## 2023-01-08 DIAGNOSIS — R2681 Unsteadiness on feet: Secondary | ICD-10-CM | POA: Diagnosis not present

## 2023-01-08 DIAGNOSIS — R293 Abnormal posture: Secondary | ICD-10-CM | POA: Diagnosis not present

## 2023-01-08 DIAGNOSIS — M6281 Muscle weakness (generalized): Secondary | ICD-10-CM | POA: Diagnosis not present

## 2023-01-08 DIAGNOSIS — R2689 Other abnormalities of gait and mobility: Secondary | ICD-10-CM | POA: Diagnosis not present

## 2023-01-08 NOTE — Therapy (Signed)
OUTPATIENT PHYSICAL THERAPY NEURO TREATMENT  Progress Note Reporting Period 11/13/22 to 01/08/23  See note below for Objective Data and Assessment of Progress/Goals.     Patient Name: [name] MRN: [id number] DOB:Oct 01, 1960, 62 y.o., male Today's Date: 01/08/2023   PCP: Merri Brunette REFERRING PROVIDER: Jetta Lout  END OF SESSION:  PT End of Session - 01/08/23 1122     Visit Number 10    Date for PT Re-Evaluation 02/05/23    PT Start Time 1120    PT Stop Time 1205    PT Time Calculation (min) 45 min    Activity Tolerance Patient tolerated treatment well    Behavior During Therapy WFL for tasks assessed/performed             Past Medical History:  Diagnosis Date   Arthritis    Atrial fibrillation (HCC)    CHF (congestive heart failure), NYHA class III (HCC)    s/p heart transplant   Chronic kidney disease    end stage   Chronic systolic dysfunction of left ventricle    CVA (cerebral infarction)    Diabetes mellitus    PMH; Prior to heart transplant   Family history of coronary artery disease    in both parents   GERD (gastroesophageal reflux disease)    Hyperlipidemia    Hypertension    Left bundle branch block    Morbid obesity (HCC)    status post lap band   Myocardial infarction [facility])    prior to heart transplant   Nonischemic cardiomyopathy (HCC)    prior to heart transplant   Obesity (BMI 30-39.9)    Obstructive sleep apnea    no longer needs CPAP after heart transplant per pt   Premature ventricular contractions    Prostate cancer (HCC)    SOB (shortness of breath)    Past Surgical History:  Procedure Laterality Date   CARDIAC PACEMAKER PLACEMENT  09/21/2009   Biventricular implantable cardioverter-defibrillator implantation      COLONOSCOPY W/ BIOPSIES AND POLYPECTOMY     CYSTOSCOPY WITH FULGERATION N/A 04/27/2021   Procedure: CYSTOSCOPY AND CLOT EVACUATION WITH BLADDER BIOPSY/ FUGARATION OF BLADDER/ FULGARATION OF PROSTATE ;   Surgeon: Jerilee Field, MD;  Location: [facility];  Service: Urology;  Laterality: N/A;   FOOT SURGERY     left   HEART TRANSPLANT  2014   LAPAROSCOPIC GASTRIC BANDING  01/27/2007   LEFT VENTRICULAR ASSIST DEVICE     implanted at [facility]   PACEMAKER REMOVAL     PROSTATE BIOPSY     TOTAL HIP ARTHROPLASTY Right 07/22/2017   Procedure: RIGHT TOTAL HIP ARTHROPLASTY ANTERIOR APPROACH;  Surgeon: Samson Frederic, MD;  Location: [facility] OR;  Service: Orthopedics;  Laterality: Right;  Needs RNFA   TOTAL KNEE ARTHROPLASTY Right 07/22/2017   Patient Active Problem List   Diagnosis Date Noted   AMS (altered mental status) 11/29/2022   Aspiration pneumonia (HCC) 11/29/2022   PNA (pneumonia) 11/29/2022   Peripheral autonomic neuropathy due to DM (HCC) 11/01/2022   Orthostatic hypotension 11/01/2022   Acute encephalopathy 07/04/2022   Chronic kidney disease, stage 5 (HCC) 11/14/2021   Gait abnormality 10/12/2021   Polyneuropathy associated with underlying disease (HCC) 10/12/2021   Neuropathic pain 10/12/2021   Morbid obesity (HCC) 11/19/2019   Sleep disorder 10/22/2019   Sleep disturbance 10/22/2019   Abnormality of gait 06/09/2019   Diabetic polyneuropathy associated with diabetes mellitus due to underlying condition (HCC) 04/14/2019   Peripheral neuropathy 12/11/2018   CVA (cerebral  vascular accident) (HCC) 11/09/2018   H/O heart transplant (HCC) 11/09/2018   Malignant neoplasm of prostate (HCC) 05/06/2018   Closed posterior dislocation of hip, right, initial encounter (HCC) 08/24/2017   End stage renal disease on dialysis (HCC) 08/24/2017   Hip dislocation, right (HCC) 08/24/2017   Osteoarthritis of right hip 07/22/2017   VENTRICULAR TACHYCARDIA 12/21/2009   Dyslipidemia 08/30/2009   Essential hypertension 08/30/2009   ISCHEMIC CARDIOMYOPATHY 08/30/2009   Atrial fibrillation (HCC) 08/30/2009   VENTRICULAR ECTOPY 08/30/2009   History of CVA (cerebrovascular accident) 08/30/2009   Obesity,  Class III, BMI 40-49.9 (morbid obesity) (HCC) 08/30/2009   DM type 2 with diabetic peripheral neuropathy (HCC) 10/17/2007   OBSTRUCTIVE SLEEP APNEA 10/17/2007   CARDIOMYOPATHY, DILATED 10/17/2007    ONSET DATE: 10/31/22  REFERRING DIAG:  D84.9 (ICD-10-CM) - Immunodeficiency, unspecified Z94.1 (ICD-10-CM) - Heart transplant status Z48.298 (ICD-10-CM) - Encounter for aftercare following other organ transplant Z79.899 (ICD-10-CM) - Other long term (current) drug therapy G20.C (ICD-10-CM) - Parkinsonism, unspecified  THERAPY DIAG:  Physical deconditioning  Abnormal posture  Muscle weakness (generalized)  Unsteadiness on feet  History of falling  Rationale for Evaluation and Treatment: Rehabilitation  SUBJECTIVE:                                                                                                                                                                                             SUBJECTIVE STATEMENT: "Ok"  Pt accompanied by: significant other  PERTINENT HISTORY: heart transplant 7 years ago  On dialysis MWF, heart transplant 6 yrs ago, ESRD, prostate CA, OSA, obesity, CHF, GERD, had DM, CVA with residual left hand weakness, peripheral neuropathy, R THA, AFib   PAIN:  Are you having pain? 6/10 R side     PRECAUTIONS: None  WEIGHT BEARING RESTRICTIONS: No  FALLS: Has patient fallen in last 6 months? Yes. Number of falls more than 10  LIVING ENVIRONMENT: Lives with: lives with their family Lives in: House/apartment Stairs: No Has following equipment at home: Environmental consultant - 2 wheeled, Wheelchair (manual), shower chair, and Grab bars  PLOF: Independent with household mobility with device and Independent with community mobility with device  PATIENT GOALS: want to be able to get him to stand and be more mobile and build his strength  OBJECTIVE:   COGNITION: Overall cognitive status: Impaired and History of cognitive impairments - at  baseline   SENSATION: WFL   POSTURE: rounded shoulders, forward head, increased thoracic kyphosis, and flexed trunk   LOWER EXTREMITY ROM:   limited with all motions due to weakness   LOWER EXTREMITY MMT:    MMT Right Eval Left  Eval  Hip flexion 2- 2-  Hip extension    Hip abduction 2+ 2+  Hip adduction 2+ 2+  Hip internal rotation    Hip external rotation    Knee flexion    Knee extension 2- 2+  Ankle dorsiflexion 3+ 3+  Ankle plantarflexion    Ankle inversion    Ankle eversion    (Blank rows = not tested)  TRANSFERS: Assistive device utilized: Environmental consultant - 2 wheeled  Sit to stand: Max A  GAIT: Level of assistance: Max A as wife reports Comments: unable to assess. Wife reports he takes a few steps in the room with walker  FUNCTIONAL TESTS:  5 times sit to stand: unable to do   01/08/23 26.41sec 12/13/22 TUG = 36 seconds , 01/01/23 TUG = 26 seconds   TODAY'S TREATMENT:                                                                                                                              DATE:  01/08/23 Nustep level 5 x 10 minutes LE MMT   R quad 4/5, HS 3+  L quad 4/5, HS 3+ TUG 26 sec 01/01/23 5x sit to stands 26.41 sec  35# LEg curls x15, 45lb 2x15 10# leg extension 3x10 10# row and straight arm pulls in standing   01/03/23 Nustep level 5 x 9 minutes 10# row and straight arm pulls in standing Standing volleyball back of knees touching mat 35# LEg curls 3x10 10# leg extension 3x10 Gait with FWW 220 feet with SBA stopped due to back pain Blue tband clamshells Standing reaching working on balance  01/01/23 10# row and straight arm pulls 2x10 each Nustep level 5 x 8 minutes 35# leg curls 3x10 5# leg extension 3x10 2# standing marches with walker 2# standing hip abduction with walker TUG 26 seconds with FWW Blue tband seated clamshells Standing volleyball back of knees touching the mat table Gait with CGA 200 feet  12/27/22 S2S 2x10 elevated surface  with UE assist rest during second set NuStep L 5 x 7 min  Rows and Ext blue Standing 2x15 Seated volleyball Leg curls 45# 3x10 Leg ext 10# 3 x10 Standing with reaches  12/20/22 Nustep level 5 x 7 minutes Leg curls 45# 3x10 Leg ext 10# 3 x10 15# rows and extension in sitting 15# biceps curls in chair Leg press 40# 2x10, 60# x10 Gait with CGA using the walker 182feet and very tired  12/18/22 Nustep level 5 x 7 minutes Seated Row and straight arm extensions 10# in chair with arms LEg extension 5# cues for TKE 3x10 LEg curls 35# 3x10 Leg press 40# 3x10 Chest press 10# 2x10 some left shoulder pain Gait with gait belt, FWW 170 feet  12/13/22 In w/c rows and extension 2x10 each Nustep level 5 x 6 minutes Leg curls 35# 3x10 Leg extension 5# some cues for full ROM 3x10 TUG 36 seconds Ball in lap isometric abs Seated  unsupported volley ball Gait with FWW x100 feet 2x  12/11/22 NuStep L 4 x 6 min S2S from elevated surface w/UE 2x10 Seated rows R theratube with handles 2x15 Standing march w/ RW two sets  LAQ no weight  HS curls green 2x15 Seated volleyball Gait 8 ft no AD decrease step length and height   11/20/22 Nustep level 3 x 5 minutes UE and LE then 1.5 minutes with LE only 1 set 10 in W/C straight arm pulls and rows 10# and 5# Seated volleyball Standing with walker marches Gait with w/c 100 feet with w/c follow LAQ no weight  Transfers to and from the car Mod/Max A  11/13/22-EVAL    PATIENT EDUCATION: Education details: POC and HEP  Person educated: Patient and Spouse Education method: Explanation Education comprehension: verbalized understanding  HOME EXERCISE PROGRAM: Access Code: [email or web address] URL: [email or web address] Date: 11/13/2022 Prepared by: Cassie Freer  Exercises - Seated March  - 1 x daily - 7 x weekly - 2 sets - 10 reps - Seated Knee Extension with Anchored Resistance  - 1 x daily - 7 x weekly - 2 sets - 10 reps - Seated Hip  Abduction with Resistance  - 1 x daily - 7 x weekly - 2 sets - 10 reps - Seated Hip Adduction Isometrics with Ball  - 1 x daily - 7 x weekly - 2 sets - 10 reps - Seated Shoulder Horizontal Abduction with Resistance  - 1 x daily - 7 x weekly - 2 sets - 10 reps  GOALS: Goals reviewed with patient? No  SHORT TERM GOALS: Target date: 12/25/22  Patient will be independent with initial HEP. Goal status: met 01/03/23  2.  Patient will demonstrate improved sitting posture in wheelchair Baseline: increased kyphosis and flexed trunk Goal status: progressing 12/13/22  3.  Patient will be modA with transfers Baseline: maxA Goal status: met 12/13/22   LONG TERM GOALS: Target date: 02/05/23  Patient will be independent with advanced/ongoing HEP to improve outcomes and carryover.  Goal status: INITIAL  2.  Patient will demonstrate improved functional LE strength as demonstrated by 4 or better. Baseline: 2/5  Goal status: Progressing 01/08/23  3.  Patient will be independent with sit to stand from wheelchair Baseline: maxA Goal status: depending on seat height doing much better 01/08/23  4.  Patient will be able to take at least 5 steps with rolling walker minA Baseline: per wife can take a few steps with modA Goal status: met 01/01/23   ASSESSMENT:  CLINICAL IMPRESSION: Patient continues to improve with his ability, continues with more standing intervention such as rows and extensions requiring SBA. RW was used for all mobility during session. Base line %x sit to stands was establish, assist form UE needed as well as some pushing again table with back of legs. No pain durin session. He has progressed increasing his LE strength. Increase resistance tolerated with seated HS curls.   OBJECTIVE IMPAIRMENTS: decreased activity tolerance, decreased coordination, decreased endurance, decreased knowledge of condition, decreased mobility, difficulty walking, decreased strength, improper body mechanics,  postural dysfunction, and obesity.   ACTIVITY LIMITATIONS: sitting, standing, squatting, stairs, transfers, bed mobility, bathing, toileting, dressing, hygiene/grooming, and locomotion level  PERSONAL FACTORS: Age, Behavior pattern, Fitness, Past/current experiences, Time since onset of injury/illness/exacerbation, and 3+ comorbidities: hx of stroke, heart and kidney transplant, hx of falls, dementia, DM, obesity, ESRD, Afib  are also affecting patient's functional outcome.   REHAB POTENTIAL: Fair    CLINICAL DECISION MAKING: Evolving/moderate  complexity  EVALUATION COMPLEXITY: Moderate   PLAN:  PT FREQUENCY: 2x/week  PT DURATION: 12 weeks  PLANNED INTERVENTIONS: Therapeutic exercises, Therapeutic activity, Neuromuscular re-education, Balance training, Gait training, Patient/Family education, Self Care, Joint mobilization, Stair training, Wheelchair mobility training, Cryotherapy, Moist heat, and Manual therapy  PLAN FOR NEXT SESSION: progressing to out of w/c in PT  Grayce Sessions, PTA 01/08/2023, 11:23 AM

## 2023-01-09 DIAGNOSIS — N186 End stage renal disease: Secondary | ICD-10-CM | POA: Diagnosis not present

## 2023-01-09 DIAGNOSIS — Z0130 Encounter for examination of blood pressure without abnormal findings: Secondary | ICD-10-CM | POA: Diagnosis not present

## 2023-01-09 DIAGNOSIS — M549 Dorsalgia, unspecified: Secondary | ICD-10-CM | POA: Diagnosis not present

## 2023-01-09 DIAGNOSIS — M5030 Other cervical disc degeneration, unspecified cervical region: Secondary | ICD-10-CM | POA: Diagnosis not present

## 2023-01-09 DIAGNOSIS — F339 Major depressive disorder, recurrent, unspecified: Secondary | ICD-10-CM | POA: Diagnosis not present

## 2023-01-09 DIAGNOSIS — Z6829 Body mass index (BMI) 29.0-29.9, adult: Secondary | ICD-10-CM | POA: Diagnosis not present

## 2023-01-09 DIAGNOSIS — G629 Polyneuropathy, unspecified: Secondary | ICD-10-CM | POA: Diagnosis not present

## 2023-01-09 DIAGNOSIS — Z992 Dependence on renal dialysis: Secondary | ICD-10-CM | POA: Diagnosis not present

## 2023-01-09 DIAGNOSIS — G8929 Other chronic pain: Secondary | ICD-10-CM | POA: Diagnosis not present

## 2023-01-09 DIAGNOSIS — D631 Anemia in chronic kidney disease: Secondary | ICD-10-CM | POA: Diagnosis not present

## 2023-01-09 DIAGNOSIS — D508 Other iron deficiency anemias: Secondary | ICD-10-CM | POA: Diagnosis not present

## 2023-01-09 DIAGNOSIS — E1129 Type 2 diabetes mellitus with other diabetic kidney complication: Secondary | ICD-10-CM | POA: Diagnosis not present

## 2023-01-09 DIAGNOSIS — E538 Deficiency of other specified B group vitamins: Secondary | ICD-10-CM | POA: Diagnosis not present

## 2023-01-09 DIAGNOSIS — N2581 Secondary hyperparathyroidism of renal origin: Secondary | ICD-10-CM | POA: Diagnosis not present

## 2023-01-09 DIAGNOSIS — M4802 Spinal stenosis, cervical region: Secondary | ICD-10-CM | POA: Diagnosis not present

## 2023-01-09 DIAGNOSIS — D509 Iron deficiency anemia, unspecified: Secondary | ICD-10-CM | POA: Diagnosis not present

## 2023-01-10 ENCOUNTER — Ambulatory Visit: Payer: Medicare Other | Admitting: Physical Therapy

## 2023-01-11 DIAGNOSIS — D631 Anemia in chronic kidney disease: Secondary | ICD-10-CM | POA: Diagnosis not present

## 2023-01-11 DIAGNOSIS — E1129 Type 2 diabetes mellitus with other diabetic kidney complication: Secondary | ICD-10-CM | POA: Diagnosis not present

## 2023-01-11 DIAGNOSIS — N186 End stage renal disease: Secondary | ICD-10-CM | POA: Diagnosis not present

## 2023-01-11 DIAGNOSIS — N2581 Secondary hyperparathyroidism of renal origin: Secondary | ICD-10-CM | POA: Diagnosis not present

## 2023-01-11 DIAGNOSIS — D509 Iron deficiency anemia, unspecified: Secondary | ICD-10-CM | POA: Diagnosis not present

## 2023-01-11 DIAGNOSIS — Z992 Dependence on renal dialysis: Secondary | ICD-10-CM | POA: Diagnosis not present

## 2023-01-14 DIAGNOSIS — D509 Iron deficiency anemia, unspecified: Secondary | ICD-10-CM | POA: Diagnosis not present

## 2023-01-14 DIAGNOSIS — N186 End stage renal disease: Secondary | ICD-10-CM | POA: Diagnosis not present

## 2023-01-14 DIAGNOSIS — E1129 Type 2 diabetes mellitus with other diabetic kidney complication: Secondary | ICD-10-CM | POA: Diagnosis not present

## 2023-01-14 DIAGNOSIS — D631 Anemia in chronic kidney disease: Secondary | ICD-10-CM | POA: Diagnosis not present

## 2023-01-14 DIAGNOSIS — Z992 Dependence on renal dialysis: Secondary | ICD-10-CM | POA: Diagnosis not present

## 2023-01-14 DIAGNOSIS — N2581 Secondary hyperparathyroidism of renal origin: Secondary | ICD-10-CM | POA: Diagnosis not present

## 2023-01-15 ENCOUNTER — Encounter: Payer: Self-pay | Admitting: Physical Therapy

## 2023-01-15 ENCOUNTER — Ambulatory Visit: Payer: Medicare Other | Admitting: Physical Therapy

## 2023-01-15 DIAGNOSIS — M6281 Muscle weakness (generalized): Secondary | ICD-10-CM

## 2023-01-15 DIAGNOSIS — R5381 Other malaise: Secondary | ICD-10-CM

## 2023-01-15 DIAGNOSIS — R293 Abnormal posture: Secondary | ICD-10-CM | POA: Diagnosis not present

## 2023-01-15 DIAGNOSIS — Z9181 History of falling: Secondary | ICD-10-CM | POA: Diagnosis not present

## 2023-01-15 DIAGNOSIS — R2681 Unsteadiness on feet: Secondary | ICD-10-CM | POA: Diagnosis not present

## 2023-01-15 DIAGNOSIS — R2689 Other abnormalities of gait and mobility: Secondary | ICD-10-CM | POA: Diagnosis not present

## 2023-01-15 NOTE — Therapy (Signed)
OUTPATIENT PHYSICAL THERAPY NEURO TREATMENT    Patient Name: [name] MRN: [id number] DOB:05-18-1961, 62 y.o., male Today's Date: 01/15/2023   PCP: Merri Brunette REFERRING PROVIDER: Jetta Lout  END OF SESSION:  PT End of Session - 01/15/23 1056     Visit Number 11    Date for PT Re-Evaluation 02/05/23    Authorization Type Medicare    PT Start Time 1100    PT Stop Time 1145    PT Time Calculation (min) 45 min    Activity Tolerance Patient tolerated treatment well    Behavior During Therapy WFL for tasks assessed/performed             Past Medical History:  Diagnosis Date   Arthritis    Atrial fibrillation (HCC)    CHF (congestive heart failure), NYHA class III (HCC)    s/p heart transplant   Chronic kidney disease    end stage   Chronic systolic dysfunction of left ventricle    CVA (cerebral infarction)    Diabetes mellitus    PMH; Prior to heart transplant   Family history of coronary artery disease    in both parents   GERD (gastroesophageal reflux disease)    Hyperlipidemia    Hypertension    Left bundle branch block    Morbid obesity (HCC)    status post lap band   Myocardial infarction [facility])    prior to heart transplant   Nonischemic cardiomyopathy (HCC)    prior to heart transplant   Obesity (BMI 30-39.9)    Obstructive sleep apnea    no longer needs CPAP after heart transplant per pt   Premature ventricular contractions    Prostate cancer (HCC)    SOB (shortness of breath)    Past Surgical History:  Procedure Laterality Date   CARDIAC PACEMAKER PLACEMENT  09/21/2009   Biventricular implantable cardioverter-defibrillator implantation      COLONOSCOPY W/ BIOPSIES AND POLYPECTOMY     CYSTOSCOPY WITH FULGERATION N/A 04/27/2021   Procedure: CYSTOSCOPY AND CLOT EVACUATION WITH BLADDER BIOPSY/ FUGARATION OF BLADDER/ FULGARATION OF PROSTATE ;  Surgeon: Jerilee Field, MD;  Location: [facility];  Service: Urology;  Laterality: N/A;   FOOT  SURGERY     left   HEART TRANSPLANT  2014   LAPAROSCOPIC GASTRIC BANDING  01/27/2007   LEFT VENTRICULAR ASSIST DEVICE     implanted at [facility]   PACEMAKER REMOVAL     PROSTATE BIOPSY     TOTAL HIP ARTHROPLASTY Right 07/22/2017   Procedure: RIGHT TOTAL HIP ARTHROPLASTY ANTERIOR APPROACH;  Surgeon: Samson Frederic, MD;  Location: [facility];  Service: Orthopedics;  Laterality: Right;  Needs RNFA   TOTAL KNEE ARTHROPLASTY Right 07/22/2017   Patient Active Problem List   Diagnosis Date Noted   AMS (altered mental status) 11/29/2022   Aspiration pneumonia (HCC) 11/29/2022   PNA (pneumonia) 11/29/2022   Peripheral autonomic neuropathy due to DM (HCC) 11/01/2022   Orthostatic hypotension 11/01/2022   Acute encephalopathy 07/04/2022   Chronic kidney disease, stage 5 (HCC) 11/14/2021   Gait abnormality 10/12/2021   Polyneuropathy associated with underlying disease (HCC) 10/12/2021   Neuropathic pain 10/12/2021   Morbid obesity (HCC) 11/19/2019   Sleep disorder 10/22/2019   Sleep disturbance 10/22/2019   Abnormality of gait 06/09/2019   Diabetic polyneuropathy associated with diabetes mellitus due to underlying condition (HCC) 04/14/2019   Peripheral neuropathy 12/11/2018   CVA (cerebral vascular accident) (HCC) 11/09/2018   H/O heart transplant (HCC) 11/09/2018   Malignant  neoplasm of prostate (HCC) 05/06/2018   Closed posterior dislocation of hip, right, initial encounter (HCC) 08/24/2017   End stage renal disease on dialysis (HCC) 08/24/2017   Hip dislocation, right (HCC) 08/24/2017   Osteoarthritis of right hip 07/22/2017   VENTRICULAR TACHYCARDIA 12/21/2009   Dyslipidemia 08/30/2009   Essential hypertension 08/30/2009   ISCHEMIC CARDIOMYOPATHY 08/30/2009   Atrial fibrillation (HCC) 08/30/2009   VENTRICULAR ECTOPY 08/30/2009   History of CVA (cerebrovascular accident) 08/30/2009   Obesity, Class III, BMI 40-49.9 (morbid obesity) (HCC) 08/30/2009   DM type 2 with diabetic peripheral  neuropathy (HCC) 10/17/2007   OBSTRUCTIVE SLEEP APNEA 10/17/2007   CARDIOMYOPATHY, DILATED 10/17/2007    ONSET DATE: 10/31/22  REFERRING DIAG:  D84.9 (ICD-10-CM) - Immunodeficiency, unspecified Z94.1 (ICD-10-CM) - Heart transplant status Z48.298 (ICD-10-CM) - Encounter for aftercare following other organ transplant Z79.899 (ICD-10-CM) - Other long term (current) drug therapy G20.C (ICD-10-CM) - Parkinsonism, unspecified  THERAPY DIAG:  Physical deconditioning  Abnormal posture  Muscle weakness (generalized)  Unsteadiness on feet  History of falling  Rationale for Evaluation and Treatment: Rehabilitation  SUBJECTIVE:                                                                                                                                                                                             SUBJECTIVE STATEMENT: "Good"  Pt accompanied by: significant other  PERTINENT HISTORY: heart transplant 7 years ago  On dialysis MWF, heart transplant 6 yrs ago, ESRD, prostate CA, OSA, obesity, CHF, GERD, had DM, CVA with residual left hand weakness, peripheral neuropathy, R THA, AFib   PAIN:  Are you having pain? 0/10 R side     PRECAUTIONS: None  WEIGHT BEARING RESTRICTIONS: No  FALLS: Has patient fallen in last 6 months? Yes. Number of falls more than 10  LIVING ENVIRONMENT: Lives with: lives with their family Lives in: House/apartment Stairs: No Has following equipment at home: Environmental consultant - 2 wheeled, Wheelchair (manual), shower chair, and Grab bars  PLOF: Independent with household mobility with device and Independent with community mobility with device  PATIENT GOALS: want to be able to get him to stand and be more mobile and build his strength  OBJECTIVE:   COGNITION: Overall cognitive status: Impaired and History of cognitive impairments - at baseline   SENSATION: WFL   POSTURE: rounded shoulders, forward head, increased thoracic kyphosis, and flexed  trunk   LOWER EXTREMITY ROM:   limited with all motions due to weakness   LOWER EXTREMITY MMT:    MMT Right Eval Left Eval  Hip flexion 2- 2-  Hip extension    Hip abduction  2+ 2+  Hip adduction 2+ 2+  Hip internal rotation    Hip external rotation    Knee flexion    Knee extension 2- 2+  Ankle dorsiflexion 3+ 3+  Ankle plantarflexion    Ankle inversion    Ankle eversion    (Blank rows = not tested)  TRANSFERS: Assistive device utilized: Environmental consultant - 2 wheeled  Sit to stand: Max A  GAIT: Level of assistance: Max A as wife reports Comments: unable to assess. Wife reports he takes a few steps in the room with walker  FUNCTIONAL TESTS:  5 times sit to stand: unable to do   01/08/23 26.41sec 12/13/22 TUG = 36 seconds , 01/01/23 TUG = 26 seconds   TODAY'S TREATMENT:                                                                                                                              DATE:  01/15/23 NuStep L5 x 7 min Alt 6in box taps w/ RW 10# row and straight arm pulls in standing HS curls 55lb x10, 45lb 2x10  10# leg extension 3x10 Fitter press all bands x10 each   01/08/23 Nustep level 5 x 10 minutes LE MMT   R quad 4/5, HS 3+  L quad 4/5, HS 3+ TUG 26 sec 01/01/23 5x sit to stands 26.41 sec  35# LEg curls x15, 45lb 2x15 10# leg extension 3x10 10# row and straight arm pulls in standing   01/03/23 Nustep level 5 x 9 minutes 10# row and straight arm pulls in standing Standing volleyball back of knees touching mat 35# LEg curls 3x10 10# leg extension 3x10 Gait with FWW 220 feet with SBA stopped due to back pain Blue tband clamshells Standing reaching working on balance  01/01/23 10# row and straight arm pulls 2x10 each Nustep level 5 x 8 minutes 35# leg curls 3x10 5# leg extension 3x10 2# standing marches with walker 2# standing hip abduction with walker TUG 26 seconds with FWW Blue tband seated clamshells Standing volleyball back of knees touching the  mat table Gait with CGA 200 feet  12/27/22 S2S 2x10 elevated surface with UE assist rest during second set NuStep L 5 x 7 min  Rows and Ext blue Standing 2x15 Seated volleyball Leg curls 45# 3x10 Leg ext 10# 3 x10 Standing with reaches  12/20/22 Nustep level 5 x 7 minutes Leg curls 45# 3x10 Leg ext 10# 3 x10 15# rows and extension in sitting 15# biceps curls in chair Leg press 40# 2x10, 60# x10 Gait with CGA using the walker 125feet and very tired  12/18/22 Nustep level 5 x 7 minutes Seated Row and straight arm extensions 10# in chair with arms LEg extension 5# cues for TKE 3x10 LEg curls 35# 3x10 Leg press 40# 3x10 Chest press 10# 2x10 some left shoulder pain Gait with gait belt, FWW 170 feet  12/13/22 In w/c rows and extension 2x10 each Nustep level 5  x 6 minutes Leg curls 35# 3x10 Leg extension 5# some cues for full ROM 3x10 TUG 36 seconds Ball in lap isometric abs Seated unsupported volley ball Gait with FWW x100 feet 2x  12/11/22 NuStep L 4 x 6 min S2S from elevated surface w/UE 2x10 Seated rows R theratube with handles 2x15 Standing march w/ RW two sets  LAQ no weight  HS curls green 2x15 Seated volleyball Gait 8 ft no AD decrease step length and height   11/20/22 Nustep level 3 x 5 minutes UE and LE then 1.5 minutes with LE only 1 set 10 in W/C straight arm pulls and rows 10# and 5# Seated volleyball Standing with walker marches Gait with w/c 100 feet with w/c follow LAQ no weight  Transfers to and from the car Mod/Max A  11/13/22-EVAL    PATIENT EDUCATION: Education details: POC and HEP  Person educated: Patient and Spouse Education method: Explanation Education comprehension: verbalized understanding  HOME EXERCISE PROGRAM: Access Code: [email or web address] URL: [email or web address] Date: 11/13/2022 Prepared by: Cassie Freer  Exercises - Seated March  - 1 x daily - 7 x weekly - 2 sets - 10 reps - Seated Knee Extension with Anchored  Resistance  - 1 x daily - 7 x weekly - 2 sets - 10 reps - Seated Hip Abduction with Resistance  - 1 x daily - 7 x weekly - 2 sets - 10 reps - Seated Hip Adduction Isometrics with Ball  - 1 x daily - 7 x weekly - 2 sets - 10 reps - Seated Shoulder Horizontal Abduction with Resistance  - 1 x daily - 7 x weekly - 2 sets - 10 reps  GOALS: Goals reviewed with patient? No  SHORT TERM GOALS: Target date: 12/25/22  Patient will be independent with initial HEP. Goal status: met 01/03/23  2.  Patient will demonstrate improved sitting posture in wheelchair Baseline: increased kyphosis and flexed trunk Goal status: progressing 12/13/22  3.  Patient will be modA with transfers Baseline: maxA Goal status: met 12/13/22   LONG TERM GOALS: Target date: 02/05/23  Patient will be independent with advanced/ongoing HEP to improve outcomes and carryover.  Goal status: INITIAL  2.  Patient will demonstrate improved functional LE strength as demonstrated by 4 or better. Baseline: 2/5  Goal status: Progressing 01/08/23  3.  Patient will be independent with sit to stand from wheelchair Baseline: maxA Goal status: depending on seat height doing much better 01/08/23  4.  Patient will be able to take at least 5 steps with rolling walker minA Baseline: per wife can take a few steps with modA Goal status: met 01/01/23   ASSESSMENT:  CLINICAL IMPRESSION: Patient continues to improve with his ability, continued with more standing intervention requiring SBA. Alt box taps did cause some increase fatigue.RW was used for all mobility during session. No pain durin session. Increase resistance tolerated with seated HS curls. LLE was weaker than RLE with fitter press  OBJECTIVE IMPAIRMENTS: decreased activity tolerance, decreased coordination, decreased endurance, decreased knowledge of condition, decreased mobility, difficulty walking, decreased strength, improper body mechanics, postural dysfunction, and obesity.    ACTIVITY LIMITATIONS: sitting, standing, squatting, stairs, transfers, bed mobility, bathing, toileting, dressing, hygiene/grooming, and locomotion level  PERSONAL FACTORS: Age, Behavior pattern, Fitness, Past/current experiences, Time since onset of injury/illness/exacerbation, and 3+ comorbidities: hx of stroke, heart and kidney transplant, hx of falls, dementia, DM, obesity, ESRD, Afib  are also affecting patient's functional outcome.   REHAB POTENTIAL: Fair  CLINICAL DECISION MAKING: Evolving/moderate complexity  EVALUATION COMPLEXITY: Moderate   PLAN:  PT FREQUENCY: 2x/week  PT DURATION: 12 weeks  PLANNED INTERVENTIONS: Therapeutic exercises, Therapeutic activity, Neuromuscular re-education, Balance training, Gait training, Patient/Family education, Self Care, Joint mobilization, Stair training, Wheelchair mobility training, Cryotherapy, Moist heat, and Manual therapy  PLAN FOR NEXT SESSION: progressing to out of w/c in PT  Grayce Sessions, PTA 01/15/2023, 10:57 AM

## 2023-01-16 DIAGNOSIS — D509 Iron deficiency anemia, unspecified: Secondary | ICD-10-CM | POA: Diagnosis not present

## 2023-01-16 DIAGNOSIS — N186 End stage renal disease: Secondary | ICD-10-CM | POA: Diagnosis not present

## 2023-01-16 DIAGNOSIS — D631 Anemia in chronic kidney disease: Secondary | ICD-10-CM | POA: Diagnosis not present

## 2023-01-16 DIAGNOSIS — N2581 Secondary hyperparathyroidism of renal origin: Secondary | ICD-10-CM | POA: Diagnosis not present

## 2023-01-16 DIAGNOSIS — Z992 Dependence on renal dialysis: Secondary | ICD-10-CM | POA: Diagnosis not present

## 2023-01-16 DIAGNOSIS — E1129 Type 2 diabetes mellitus with other diabetic kidney complication: Secondary | ICD-10-CM | POA: Diagnosis not present

## 2023-01-17 ENCOUNTER — Ambulatory Visit: Payer: Medicare Other | Admitting: Physical Therapy

## 2023-01-18 DIAGNOSIS — E1129 Type 2 diabetes mellitus with other diabetic kidney complication: Secondary | ICD-10-CM | POA: Diagnosis not present

## 2023-01-18 DIAGNOSIS — N2581 Secondary hyperparathyroidism of renal origin: Secondary | ICD-10-CM | POA: Diagnosis not present

## 2023-01-18 DIAGNOSIS — Z992 Dependence on renal dialysis: Secondary | ICD-10-CM | POA: Diagnosis not present

## 2023-01-18 DIAGNOSIS — D631 Anemia in chronic kidney disease: Secondary | ICD-10-CM | POA: Diagnosis not present

## 2023-01-18 DIAGNOSIS — N186 End stage renal disease: Secondary | ICD-10-CM | POA: Diagnosis not present

## 2023-01-18 DIAGNOSIS — D509 Iron deficiency anemia, unspecified: Secondary | ICD-10-CM | POA: Diagnosis not present

## 2023-01-21 DIAGNOSIS — N186 End stage renal disease: Secondary | ICD-10-CM | POA: Diagnosis not present

## 2023-01-21 DIAGNOSIS — E1129 Type 2 diabetes mellitus with other diabetic kidney complication: Secondary | ICD-10-CM | POA: Diagnosis not present

## 2023-01-21 DIAGNOSIS — D631 Anemia in chronic kidney disease: Secondary | ICD-10-CM | POA: Diagnosis not present

## 2023-01-21 DIAGNOSIS — Z992 Dependence on renal dialysis: Secondary | ICD-10-CM | POA: Diagnosis not present

## 2023-01-21 DIAGNOSIS — N2581 Secondary hyperparathyroidism of renal origin: Secondary | ICD-10-CM | POA: Diagnosis not present

## 2023-01-21 DIAGNOSIS — D509 Iron deficiency anemia, unspecified: Secondary | ICD-10-CM | POA: Diagnosis not present

## 2023-01-22 ENCOUNTER — Encounter: Payer: Self-pay | Admitting: Physical Therapy

## 2023-01-22 ENCOUNTER — Ambulatory Visit: Payer: Medicare Other | Admitting: Physical Therapy

## 2023-01-22 DIAGNOSIS — Z86010 Personal history of colonic polyps: Secondary | ICD-10-CM | POA: Diagnosis not present

## 2023-01-22 DIAGNOSIS — Z9181 History of falling: Secondary | ICD-10-CM | POA: Diagnosis not present

## 2023-01-22 DIAGNOSIS — R293 Abnormal posture: Secondary | ICD-10-CM | POA: Diagnosis not present

## 2023-01-22 DIAGNOSIS — R2681 Unsteadiness on feet: Secondary | ICD-10-CM

## 2023-01-22 DIAGNOSIS — R5381 Other malaise: Secondary | ICD-10-CM

## 2023-01-22 DIAGNOSIS — M6281 Muscle weakness (generalized): Secondary | ICD-10-CM

## 2023-01-22 DIAGNOSIS — R2689 Other abnormalities of gait and mobility: Secondary | ICD-10-CM | POA: Diagnosis not present

## 2023-01-22 DIAGNOSIS — K5909 Other constipation: Secondary | ICD-10-CM | POA: Diagnosis not present

## 2023-01-22 NOTE — Therapy (Signed)
OUTPATIENT PHYSICAL THERAPY NEURO TREATMENT    Patient Name: [name] MRN: [id number] DOB:[birth year], 62 y.o., male Today's Date: 01/22/2023   PCP: Merri Brunette REFERRING PROVIDER: Jetta Lout  END OF SESSION:  PT End of Session - 01/22/23 1020     Visit Number 12    Number of Visits 17    Date for PT Re-Evaluation 02/05/23    Authorization Type Medicare    PT Start Time 1013    PT Stop Time 1100    PT Time Calculation (min) 47 min    Equipment Utilized During Treatment Gait belt    Activity Tolerance Patient tolerated treatment well    Behavior During Therapy WFL for tasks assessed/performed             Past Medical History:  Diagnosis Date   Arthritis    Atrial fibrillation (HCC)    CHF (congestive heart failure), NYHA class III (HCC)    s/p heart transplant   Chronic kidney disease    end stage   Chronic systolic dysfunction of left ventricle    CVA (cerebral infarction)    Diabetes mellitus    PMH; Prior to heart transplant   Family history of coronary artery disease    in both parents   GERD (gastroesophageal reflux disease)    Hyperlipidemia    Hypertension    Left bundle branch block    Morbid obesity (HCC)    status post lap band   Myocardial infarction [facility])    prior to heart transplant   Nonischemic cardiomyopathy (HCC)    prior to heart transplant   Obesity (BMI 30-39.9)    Obstructive sleep apnea    no longer needs CPAP after heart transplant per pt   Premature ventricular contractions    Prostate cancer (HCC)    SOB (shortness of breath)    Past Surgical History:  Procedure Laterality Date   CARDIAC PACEMAKER PLACEMENT  09/21/2009   Biventricular implantable cardioverter-defibrillator implantation      COLONOSCOPY W/ BIOPSIES AND POLYPECTOMY     CYSTOSCOPY WITH FULGERATION N/A 04/27/2021   Procedure: CYSTOSCOPY AND CLOT EVACUATION WITH BLADDER BIOPSY/ FUGARATION OF BLADDER/ FULGARATION OF PROSTATE ;  Surgeon: Jerilee Field, MD;  Location: [facility];  Service: Urology;  Laterality: N/A;   FOOT SURGERY     left   HEART TRANSPLANT  2014   LAPAROSCOPIC GASTRIC BANDING  01/27/2007   LEFT VENTRICULAR ASSIST DEVICE     implanted at [facility]   PACEMAKER REMOVAL     PROSTATE BIOPSY     TOTAL HIP ARTHROPLASTY Right 07/22/2017   Procedure: RIGHT TOTAL HIP ARTHROPLASTY ANTERIOR APPROACH;  Surgeon: Samson Frederic, MD;  Location: [facility] OR;  Service: Orthopedics;  Laterality: Right;  Needs RNFA   TOTAL KNEE ARTHROPLASTY Right 07/22/2017   Patient Active Problem List   Diagnosis Date Noted   AMS (altered mental status) 11/29/2022   Aspiration pneumonia (HCC) 11/29/2022   PNA (pneumonia) 11/29/2022   Peripheral autonomic neuropathy due to DM (HCC) 11/01/2022   Orthostatic hypotension 11/01/2022   Acute encephalopathy 07/04/2022   Chronic kidney disease, stage 5 (HCC) 11/14/2021   Gait abnormality 10/12/2021   Polyneuropathy associated with underlying disease (HCC) 10/12/2021   Neuropathic pain 10/12/2021   Morbid obesity (HCC) 11/19/2019   Sleep disorder 10/22/2019   Sleep disturbance 10/22/2019   Abnormality of gait 06/09/2019   Diabetic polyneuropathy associated with diabetes mellitus due to underlying condition (HCC) 04/14/2019   Peripheral neuropathy 12/11/2018  CVA (cerebral vascular accident) (HCC) 11/09/2018   H/O heart transplant (HCC) 11/09/2018   Malignant neoplasm of prostate (HCC) 05/06/2018   Closed posterior dislocation of hip, right, initial encounter (HCC) 08/24/2017   End stage renal disease on dialysis (HCC) 08/24/2017   Hip dislocation, right (HCC) 08/24/2017   Osteoarthritis of right hip 07/22/2017   VENTRICULAR TACHYCARDIA 12/21/2009   Dyslipidemia 08/30/2009   Essential hypertension 08/30/2009   ISCHEMIC CARDIOMYOPATHY 08/30/2009   Atrial fibrillation (HCC) 08/30/2009   VENTRICULAR ECTOPY 08/30/2009   History of CVA (cerebrovascular accident) 08/30/2009   Obesity, Class III, BMI  40-49.9 (morbid obesity) (HCC) 08/30/2009   DM type 2 with diabetic peripheral neuropathy (HCC) 10/17/2007   OBSTRUCTIVE SLEEP APNEA 10/17/2007   CARDIOMYOPATHY, DILATED 10/17/2007    ONSET DATE: 10/31/22  REFERRING DIAG:  D84.9 (ICD-10-CM) - Immunodeficiency, unspecified Z94.1 (ICD-10-CM) - Heart transplant status Z48.298 (ICD-10-CM) - Encounter for aftercare following other organ transplant Z79.899 (ICD-10-CM) - Other long term (current) drug therapy G20.C (ICD-10-CM) - Parkinsonism, unspecified  THERAPY DIAG:  Physical deconditioning  Abnormal posture  Muscle weakness (generalized)  Unsteadiness on feet  History of falling  Rationale for Evaluation and Treatment: Rehabilitation  SUBJECTIVE:                                                                                                                                                                                             SUBJECTIVE STATEMENT: Pretty good, no issues, trying to walk some at home  Pt accompanied by: significant other  PERTINENT HISTORY: heart transplant 7 years ago  On dialysis MWF, heart transplant 6 yrs ago, ESRD, prostate CA, OSA, obesity, CHF, GERD, had DM, CVA with residual left hand weakness, peripheral neuropathy, R THA, AFib   PAIN:  Are you having pain? 0/10 R side     PRECAUTIONS: None  WEIGHT BEARING RESTRICTIONS: No  FALLS: Has patient fallen in last 6 months? Yes. Number of falls more than 10  LIVING ENVIRONMENT: Lives with: lives with their family Lives in: House/apartment Stairs: No Has following equipment at home: Environmental consultant - 2 wheeled, Wheelchair (manual), shower chair, and Grab bars  PLOF: Independent with household mobility with device and Independent with community mobility with device  PATIENT GOALS: want to be able to get him to stand and be more mobile and build his strength  OBJECTIVE:   COGNITION: Overall cognitive status: Impaired and History of cognitive  impairments - at baseline   SENSATION: WFL   POSTURE: rounded shoulders, forward head, increased thoracic kyphosis, and flexed trunk   LOWER EXTREMITY ROM:   limited with all motions due to weakness  LOWER EXTREMITY MMT:    MMT Right Eval Left Eval  Hip flexion 2- 2-  Hip extension    Hip abduction 2+ 2+  Hip adduction 2+ 2+  Hip internal rotation    Hip external rotation    Knee flexion    Knee extension 2- 2+  Ankle dorsiflexion 3+ 3+  Ankle plantarflexion    Ankle inversion    Ankle eversion    (Blank rows = not tested)  TRANSFERS: Assistive device utilized: Environmental consultant - 2 wheeled  Sit to stand: Max A  GAIT: Level of assistance: Max A as wife reports Comments: unable to assess. Wife reports he takes a few steps in the room with walker  FUNCTIONAL TESTS:  5 times sit to stand: unable to do   01/08/23 26.41sec 12/13/22 TUG = 36 seconds , 01/01/23 TUG = 26 seconds   TODAY'S TREATMENT:                                                                                                                              DATE:  01/22/23 Leg curls 35# 3x10 10# Leg extension 3x10 15# straight arm pulls Gait 220' with walker and SBA no rest With walker 2.5 # marches 2.5# hip abduction Leg press 60# 3x10, small ROM Calf press 20# 3x10 Nustep level 6 x 6 minutes Using 2 SPC's standing 4" toe taps Walking 25' with 2 SPC's  01/15/23 NuStep L5 x 7 min Alt 6in box taps w/ RW 10# row and straight arm pulls in standing HS curls 55lb x10, 45lb 2x10  10# leg extension 3x10 Fitter press all bands x10 each   01/08/23 Nustep level 5 x 10 minutes LE MMT   R quad 4/5, HS 3+  L quad 4/5, HS 3+ TUG 26 sec 01/01/23 5x sit to stands 26.41 sec  35# LEg curls x15, 45lb 2x15 10# leg extension 3x10 10# row and straight arm pulls in standing   01/03/23 Nustep level 5 x 9 minutes 10# row and straight arm pulls in standing Standing volleyball back of knees touching mat 35# LEg curls  3x10 10# leg extension 3x10 Gait with FWW 220 feet with SBA stopped due to back pain Blue tband clamshells Standing reaching working on balance  01/01/23 10# row and straight arm pulls 2x10 each Nustep level 5 x 8 minutes 35# leg curls 3x10 5# leg extension 3x10 2# standing marches with walker 2# standing hip abduction with walker TUG 26 seconds with FWW Blue tband seated clamshells Standing volleyball back of knees touching the mat table Gait with CGA 200 feet  12/27/22 S2S 2x10 elevated surface with UE assist rest during second set NuStep L 5 x 7 min  Rows and Ext blue Standing 2x15 Seated volleyball Leg curls 45# 3x10 Leg ext 10# 3 x10 Standing with reaches  12/20/22 Nustep level 5 x 7 minutes Leg curls 45# 3x10 Leg ext 10# 3 x10 15# rows and extension in sitting  15# biceps curls in chair Leg press 40# 2x10, 60# x10 Gait with CGA using the walker 152feet and very tired  12/18/22 Nustep level 5 x 7 minutes Seated Row and straight arm extensions 10# in chair with arms LEg extension 5# cues for TKE 3x10 LEg curls 35# 3x10 Leg press 40# 3x10 Chest press 10# 2x10 some left shoulder pain Gait with gait belt, FWW 170 feet  12/13/22 In w/c rows and extension 2x10 each Nustep level 5 x 6 minutes Leg curls 35# 3x10 Leg extension 5# some cues for full ROM 3x10 TUG 36 seconds Ball in lap isometric abs Seated unsupported volley ball Gait with FWW x100 feet 2x  12/11/22 NuStep L 4 x 6 min S2S from elevated surface w/UE 2x10 Seated rows R theratube with handles 2x15 Standing march w/ RW two sets  LAQ no weight  HS curls green 2x15 Seated volleyball Gait 8 ft no AD decrease step length and height   11/20/22 Nustep level 3 x 5 minutes UE and LE then 1.5 minutes with LE only 1 set 10 in W/C straight arm pulls and rows 10# and 5# Seated volleyball Standing with walker marches Gait with w/c 100 feet with w/c follow LAQ no weight  Transfers to and from the car Mod/Max  A  11/13/22-EVAL    PATIENT EDUCATION: Education details: POC and HEP  Person educated: Patient and Spouse Education method: Explanation Education comprehension: verbalized understanding  HOME EXERCISE PROGRAM: Access Code: [email or web address] URL: [email or web address] Date: 11/13/2022 Prepared by: Cassie Freer  Exercises - Seated March  - 1 x daily - 7 x weekly - 2 sets - 10 reps - Seated Knee Extension with Anchored Resistance  - 1 x daily - 7 x weekly - 2 sets - 10 reps - Seated Hip Abduction with Resistance  - 1 x daily - 7 x weekly - 2 sets - 10 reps - Seated Hip Adduction Isometrics with Ball  - 1 x daily - 7 x weekly - 2 sets - 10 reps - Seated Shoulder Horizontal Abduction with Resistance  - 1 x daily - 7 x weekly - 2 sets - 10 reps  GOALS: Goals reviewed with patient? No  SHORT TERM GOALS: Target date: 12/25/22  Patient will be independent with initial HEP. Goal status: met 01/03/23  2.  Patient will demonstrate improved sitting posture in wheelchair Baseline: increased kyphosis and flexed trunk Goal status: progressing 12/13/22  3.  Patient will be modA with transfers Baseline: maxA Goal status: met 12/13/22   LONG TERM GOALS: Target date: 02/05/23  Patient will be independent with advanced/ongoing HEP to improve outcomes and carryover.  Goal status: INITIAL  2.  Patient will demonstrate improved functional LE strength as demonstrated by 4 or better. Baseline: 2/5  Goal status: Progressing 01/22/23  3.  Patient will be independent with sit to stand from wheelchair Baseline: maxA Goal status: depending on seat height doing much better 01/08/23  4.  Patient will be able to take at least 5 steps with rolling walker minA Baseline: per wife can take a few steps with modA Goal status: met 01/01/23   ASSESSMENT:  CLINICAL IMPRESSION: Did a little more walking today without rest, worked on leg strength as well, he struggles due to neuropathy with his balance.   Added the hip exercises and he has difficulty with the left hip abduction OBJECTIVE IMPAIRMENTS: decreased activity tolerance, decreased coordination, decreased endurance, decreased knowledge of condition, decreased mobility, difficulty walking, decreased strength, improper body mechanics,  postural dysfunction, and obesity.   ACTIVITY LIMITATIONS: sitting, standing, squatting, stairs, transfers, bed mobility, bathing, toileting, dressing, hygiene/grooming, and locomotion level  PERSONAL FACTORS: Age, Behavior pattern, Fitness, Past/current experiences, Time since onset of injury/illness/exacerbation, and 3+ comorbidities: hx of stroke, heart and kidney transplant, hx of falls, dementia, DM, obesity, ESRD, Afib  are also affecting patient's functional outcome.   REHAB POTENTIAL: Fair    CLINICAL DECISION MAKING: Evolving/moderate complexity  EVALUATION COMPLEXITY: Moderate   PLAN:  PT FREQUENCY: 2x/week  PT DURATION: 12 weeks  PLANNED INTERVENTIONS: Therapeutic exercises, Therapeutic activity, Neuromuscular re-education, Balance training, Gait training, Patient/Family education, Self Care, Joint mobilization, Stair training, Wheelchair mobility training, Cryotherapy, Moist heat, and Manual therapy  PLAN FOR NEXT SESSION: progressing to out of w/c in PT  Meggen Spaziani W, PT 01/22/2023, 10:21 AM

## 2023-01-23 DIAGNOSIS — D631 Anemia in chronic kidney disease: Secondary | ICD-10-CM | POA: Diagnosis not present

## 2023-01-23 DIAGNOSIS — D509 Iron deficiency anemia, unspecified: Secondary | ICD-10-CM | POA: Diagnosis not present

## 2023-01-23 DIAGNOSIS — Z992 Dependence on renal dialysis: Secondary | ICD-10-CM | POA: Diagnosis not present

## 2023-01-23 DIAGNOSIS — E1129 Type 2 diabetes mellitus with other diabetic kidney complication: Secondary | ICD-10-CM | POA: Diagnosis not present

## 2023-01-23 DIAGNOSIS — N186 End stage renal disease: Secondary | ICD-10-CM | POA: Diagnosis not present

## 2023-01-23 DIAGNOSIS — N2581 Secondary hyperparathyroidism of renal origin: Secondary | ICD-10-CM | POA: Diagnosis not present

## 2023-01-24 ENCOUNTER — Encounter: Payer: Self-pay | Admitting: Physical Therapy

## 2023-01-24 ENCOUNTER — Ambulatory Visit: Payer: Medicare Other | Admitting: Physical Therapy

## 2023-01-24 DIAGNOSIS — M6281 Muscle weakness (generalized): Secondary | ICD-10-CM | POA: Diagnosis not present

## 2023-01-24 DIAGNOSIS — R2689 Other abnormalities of gait and mobility: Secondary | ICD-10-CM

## 2023-01-24 DIAGNOSIS — M545 Low back pain, unspecified: Secondary | ICD-10-CM

## 2023-01-24 DIAGNOSIS — R5381 Other malaise: Secondary | ICD-10-CM

## 2023-01-24 DIAGNOSIS — Z9181 History of falling: Secondary | ICD-10-CM | POA: Diagnosis not present

## 2023-01-24 DIAGNOSIS — R2681 Unsteadiness on feet: Secondary | ICD-10-CM | POA: Diagnosis not present

## 2023-01-24 DIAGNOSIS — R293 Abnormal posture: Secondary | ICD-10-CM

## 2023-01-24 NOTE — Therapy (Signed)
OUTPATIENT PHYSICAL THERAPY NEURO TREATMENT    Patient Name: [name] MRN: [id number] DOB:[birth year], 62 y.o., male Today's Date: 01/24/2023   PCP: Merri Brunette REFERRING PROVIDER: Jetta Lout  END OF SESSION:  PT End of Session - 01/24/23 1604     Visit Number 13    Date for PT Re-Evaluation 02/05/23    Authorization Type Medicare    PT Start Time 1604    PT Stop Time 1655    PT Time Calculation (min) 51 min    Equipment Utilized During Treatment Gait belt    Activity Tolerance Patient tolerated treatment well    Behavior During Therapy WFL for tasks assessed/performed             Past Medical History:  Diagnosis Date   Arthritis    Atrial fibrillation (HCC)    CHF (congestive heart failure), NYHA class III (HCC)    s/p heart transplant   Chronic kidney disease    end stage   Chronic systolic dysfunction of left ventricle    CVA (cerebral infarction)    Diabetes mellitus    PMH; Prior to heart transplant   Family history of coronary artery disease    in both parents   GERD (gastroesophageal reflux disease)    Hyperlipidemia    Hypertension    Left bundle branch block    Morbid obesity (HCC)    status post lap band   Myocardial infarction [facility])    prior to heart transplant   Nonischemic cardiomyopathy (HCC)    prior to heart transplant   Obesity (BMI 30-39.9)    Obstructive sleep apnea    no longer needs CPAP after heart transplant per pt   Premature ventricular contractions    Prostate cancer (HCC)    SOB (shortness of breath)    Past Surgical History:  Procedure Laterality Date   CARDIAC PACEMAKER PLACEMENT  09/21/2009   Biventricular implantable cardioverter-defibrillator implantation      COLONOSCOPY W/ BIOPSIES AND POLYPECTOMY     CYSTOSCOPY WITH FULGERATION N/A 04/27/2021   Procedure: CYSTOSCOPY AND CLOT EVACUATION WITH BLADDER BIOPSY/ FUGARATION OF BLADDER/ FULGARATION OF PROSTATE ;  Surgeon: Jerilee Field, MD;  Location: [facility];   Service: Urology;  Laterality: N/A;   FOOT SURGERY     left   HEART TRANSPLANT  2014   LAPAROSCOPIC GASTRIC BANDING  01/27/2007   LEFT VENTRICULAR ASSIST DEVICE     implanted at [facility]   PACEMAKER REMOVAL     PROSTATE BIOPSY     TOTAL HIP ARTHROPLASTY Right 07/22/2017   Procedure: RIGHT TOTAL HIP ARTHROPLASTY ANTERIOR APPROACH;  Surgeon: Samson Frederic, MD;  Location: [facility];  Service: Orthopedics;  Laterality: Right;  Needs RNFA   TOTAL KNEE ARTHROPLASTY Right 07/22/2017   Patient Active Problem List   Diagnosis Date Noted   AMS (altered mental status) 11/29/2022   Aspiration pneumonia (HCC) 11/29/2022   PNA (pneumonia) 11/29/2022   Peripheral autonomic neuropathy due to DM (HCC) 11/01/2022   Orthostatic hypotension 11/01/2022   Acute encephalopathy 07/04/2022   Chronic kidney disease, stage 5 (HCC) 11/14/2021   Gait abnormality 10/12/2021   Polyneuropathy associated with underlying disease (HCC) 10/12/2021   Neuropathic pain 10/12/2021   Morbid obesity (HCC) 11/19/2019   Sleep disorder 10/22/2019   Sleep disturbance 10/22/2019   Abnormality of gait 06/09/2019   Diabetic polyneuropathy associated with diabetes mellitus due to underlying condition (HCC) 04/14/2019   Peripheral neuropathy 12/11/2018   CVA (cerebral vascular accident) (HCC) 11/09/2018  H/O heart transplant (HCC) 11/09/2018   Malignant neoplasm of prostate (HCC) 05/06/2018   Closed posterior dislocation of hip, right, initial encounter (HCC) 08/24/2017   End stage renal disease on dialysis (HCC) 08/24/2017   Hip dislocation, right (HCC) 08/24/2017   Osteoarthritis of right hip 07/22/2017   VENTRICULAR TACHYCARDIA 12/21/2009   Dyslipidemia 08/30/2009   Essential hypertension 08/30/2009   ISCHEMIC CARDIOMYOPATHY 08/30/2009   Atrial fibrillation (HCC) 08/30/2009   VENTRICULAR ECTOPY 08/30/2009   History of CVA (cerebrovascular accident) 08/30/2009   Obesity, Class III, BMI 40-49.9 (morbid obesity) (HCC)  08/30/2009   DM type 2 with diabetic peripheral neuropathy (HCC) 10/17/2007   OBSTRUCTIVE SLEEP APNEA 10/17/2007   CARDIOMYOPATHY, DILATED 10/17/2007    ONSET DATE: 10/31/22  REFERRING DIAG:  D84.9 (ICD-10-CM) - Immunodeficiency, unspecified Z94.1 (ICD-10-CM) - Heart transplant status Z48.298 (ICD-10-CM) - Encounter for aftercare following other organ transplant Z79.899 (ICD-10-CM) - Other long term (current) drug therapy G20.C (ICD-10-CM) - Parkinsonism, unspecified  THERAPY DIAG:  Physical deconditioning  Abnormal posture  Muscle weakness (generalized)  Unsteadiness on feet  History of falling  Other abnormalities of gait and mobility  Chronic low back pain, unspecified back pain laterality, unspecified whether sciatica present  Rationale for Evaluation and Treatment: Rehabilitation  SUBJECTIVE:                                                                                                                                                                                             SUBJECTIVE STATEMENT: I want to walk today, outside  Pt accompanied by: significant other  PERTINENT HISTORY: heart transplant 7 years ago  On dialysis MWF, heart transplant 6 yrs ago, ESRD, prostate CA, OSA, obesity, CHF, GERD, had DM, CVA with residual left hand weakness, peripheral neuropathy, R THA, AFib   PAIN:  Are you having pain? 0/10 R side     PRECAUTIONS: None  WEIGHT BEARING RESTRICTIONS: No  FALLS: Has patient fallen in last 6 months? Yes. Number of falls more than 10  LIVING ENVIRONMENT: Lives with: lives with their family Lives in: House/apartment Stairs: No Has following equipment at home: Environmental consultant - 2 wheeled, Wheelchair (manual), shower chair, and Grab bars  PLOF: Independent with household mobility with device and Independent with community mobility with device  PATIENT GOALS: want to be able to get him to stand and be more mobile and build his  strength  OBJECTIVE:   COGNITION: Overall cognitive status: Impaired and History of cognitive impairments - at baseline   SENSATION: WFL   POSTURE: rounded shoulders, forward head, increased thoracic kyphosis, and flexed trunk   LOWER EXTREMITY ROM:  limited with all motions due to weakness   LOWER EXTREMITY MMT:    MMT Right Eval Left Eval  Hip flexion 2- 2-  Hip extension    Hip abduction 2+ 2+  Hip adduction 2+ 2+  Hip internal rotation    Hip external rotation    Knee flexion    Knee extension 2- 2+  Ankle dorsiflexion 3+ 3+  Ankle plantarflexion    Ankle inversion    Ankle eversion    (Blank rows = not tested)  TRANSFERS: Assistive device utilized: Environmental consultant - 2 wheeled  Sit to stand: Max A  GAIT: Level of assistance: Max A as wife reports Comments: unable to assess. Wife reports he takes a few steps in the room with walker  FUNCTIONAL TESTS:  5 times sit to stand: unable to do   01/08/23 26.41sec 12/13/22 TUG = 36 seconds , 01/01/23 TUG = 26 seconds   TODAY'S TREATMENT:                                                                                                                              DATE:  01/24/23 Gait out the back door down a curb then around to the front of the building, we had to take about 8 standing rest breaks mostly due to back tightness Nustep level 5 x 7 minutes 15# straight arm pulls 2x10 Leg curls 45# 3x10 Leg extension 10# 3x10 Volleyball in standing Standing 6" toe touches alternating Seated rows 25# 2x10 Lats 25# 2x10  01/22/23 Leg curls 35# 3x10 10# Leg extension 3x10 15# straight arm pulls Gait 220' with walker and SBA no rest With walker 2.5 # marches 2.5# hip abduction Leg press 60# 3x10, small ROM Calf press 20# 3x10 Nustep level 6 x 6 minutes Using 2 SPC's standing 4" toe taps Walking 25' with 2 SPC's  01/15/23 NuStep L5 x 7 min Alt 6in box taps w/ RW 10# row and straight arm pulls in standing HS curls 55lb x10,  45lb 2x10  10# leg extension 3x10 Fitter press all bands x10 each   01/08/23 Nustep level 5 x 10 minutes LE MMT   R quad 4/5, HS 3+  L quad 4/5, HS 3+ TUG 26 sec 01/01/23 5x sit to stands 26.41 sec  35# LEg curls x15, 45lb 2x15 10# leg extension 3x10 10# row and straight arm pulls in standing   01/03/23 Nustep level 5 x 9 minutes 10# row and straight arm pulls in standing Standing volleyball back of knees touching mat 35# LEg curls 3x10 10# leg extension 3x10 Gait with FWW 220 feet with SBA stopped due to back pain Blue tband clamshells Standing reaching working on balance  01/01/23 10# row and straight arm pulls 2x10 each Nustep level 5 x 8 minutes 35# leg curls 3x10 5# leg extension 3x10 2# standing marches with walker 2# standing hip abduction with walker TUG 26 seconds with FWW Blue tband seated clamshells Standing volleyball  back of knees touching the mat table Gait with CGA 200 feet  12/27/22 S2S 2x10 elevated surface with UE assist rest during second set NuStep L 5 x 7 min  Rows and Ext blue Standing 2x15 Seated volleyball Leg curls 45# 3x10 Leg ext 10# 3 x10 Standing with reaches  PATIENT EDUCATION: Education details: POC and HEP  Person educated: Patient and Spouse Education method: Explanation Education comprehension: verbalized understanding  HOME EXERCISE PROGRAM: Access Code: [email or web address] URL: [email or web address] Date: 11/13/2022 Prepared by: Cassie Freer  Exercises - Seated March  - 1 x daily - 7 x weekly - 2 sets - 10 reps - Seated Knee Extension with Anchored Resistance  - 1 x daily - 7 x weekly - 2 sets - 10 reps - Seated Hip Abduction with Resistance  - 1 x daily - 7 x weekly - 2 sets - 10 reps - Seated Hip Adduction Isometrics with Ball  - 1 x daily - 7 x weekly - 2 sets - 10 reps - Seated Shoulder Horizontal Abduction with Resistance  - 1 x daily - 7 x weekly - 2 sets - 10 reps  GOALS: Goals reviewed with patient? No  SHORT  TERM GOALS: Target date: 12/25/22  Patient will be independent with initial HEP. Goal status: met 01/03/23  2.  Patient will demonstrate improved sitting posture in wheelchair Baseline: increased kyphosis and flexed trunk Goal status: progressing 12/13/22  3.  Patient will be modA with transfers Baseline: maxA Goal status: met 12/13/22   LONG TERM GOALS: Target date: 02/05/23  Patient will be independent with advanced/ongoing HEP to improve outcomes and carryover.  Goal status: INITIAL  2.  Patient will demonstrate improved functional LE strength as demonstrated by 4 or better. Baseline: 2/5  Goal status: Progressing 01/22/23  3.  Patient will be independent with sit to stand from wheelchair Baseline: maxA Goal status: depending on seat height doing much better 01/08/23  4.  Patient will be able to take at least 5 steps with rolling walker minA Baseline: per wife can take a few steps with modA Goal status: met 01/01/23   ASSESSMENT:  CLINICAL IMPRESSION: Really worked hard today, he anted to ealk outside so we did, at about 250 feet he had some back pain and needed a standing rest break, but we were able to finish with more standing rest breaks, had c/o tightness in the lumbar mms.  Added seated rows and lats, difficulty with the on and off the rows/lats OBJECTIVE IMPAIRMENTS: decreased activity tolerance, decreased coordination, decreased endurance, decreased knowledge of condition, decreased mobility, difficulty walking, decreased strength, improper body mechanics, postural dysfunction, and obesity.   ACTIVITY LIMITATIONS: sitting, standing, squatting, stairs, transfers, bed mobility, bathing, toileting, dressing, hygiene/grooming, and locomotion level  PERSONAL FACTORS: Age, Behavior pattern, Fitness, Past/current experiences, Time since onset of injury/illness/exacerbation, and 3+ comorbidities: hx of stroke, heart and kidney transplant, hx of falls, dementia, DM, obesity, ESRD,  Afib  are also affecting patient's functional outcome.   REHAB POTENTIAL: Fair    CLINICAL DECISION MAKING: Evolving/moderate complexity  EVALUATION COMPLEXITY: Moderate   PLAN:  PT FREQUENCY: 2x/week  PT DURATION: 12 weeks  PLANNED INTERVENTIONS: Therapeutic exercises, Therapeutic activity, Neuromuscular re-education, Balance training, Gait training, Patient/Family education, Self Care, Joint mobilization, Stair training, Wheelchair mobility training, Cryotherapy, Moist heat, and Manual therapy  PLAN FOR NEXT SESSION: continue to push the function, see how tired he was after tdoay  Jearld Lesch, PT 01/24/2023, 4:05 PM

## 2023-01-25 DIAGNOSIS — E1129 Type 2 diabetes mellitus with other diabetic kidney complication: Secondary | ICD-10-CM | POA: Diagnosis not present

## 2023-01-25 DIAGNOSIS — D509 Iron deficiency anemia, unspecified: Secondary | ICD-10-CM | POA: Diagnosis not present

## 2023-01-25 DIAGNOSIS — Z992 Dependence on renal dialysis: Secondary | ICD-10-CM | POA: Diagnosis not present

## 2023-01-25 DIAGNOSIS — D631 Anemia in chronic kidney disease: Secondary | ICD-10-CM | POA: Diagnosis not present

## 2023-01-25 DIAGNOSIS — N2581 Secondary hyperparathyroidism of renal origin: Secondary | ICD-10-CM | POA: Diagnosis not present

## 2023-01-25 DIAGNOSIS — N186 End stage renal disease: Secondary | ICD-10-CM | POA: Diagnosis not present

## 2023-01-26 DIAGNOSIS — N186 End stage renal disease: Secondary | ICD-10-CM | POA: Diagnosis not present

## 2023-01-26 DIAGNOSIS — Z992 Dependence on renal dialysis: Secondary | ICD-10-CM | POA: Diagnosis not present

## 2023-01-26 DIAGNOSIS — T8620 Unspecified complication of heart transplant: Secondary | ICD-10-CM | POA: Diagnosis not present

## 2023-01-28 DIAGNOSIS — D631 Anemia in chronic kidney disease: Secondary | ICD-10-CM | POA: Diagnosis not present

## 2023-01-28 DIAGNOSIS — N2581 Secondary hyperparathyroidism of renal origin: Secondary | ICD-10-CM | POA: Diagnosis not present

## 2023-01-28 DIAGNOSIS — D509 Iron deficiency anemia, unspecified: Secondary | ICD-10-CM | POA: Diagnosis not present

## 2023-01-28 DIAGNOSIS — N186 End stage renal disease: Secondary | ICD-10-CM | POA: Diagnosis not present

## 2023-01-28 DIAGNOSIS — E1129 Type 2 diabetes mellitus with other diabetic kidney complication: Secondary | ICD-10-CM | POA: Diagnosis not present

## 2023-01-28 DIAGNOSIS — Z992 Dependence on renal dialysis: Secondary | ICD-10-CM | POA: Diagnosis not present

## 2023-01-29 ENCOUNTER — Ambulatory Visit: Payer: Medicare Other | Admitting: Physical Therapy

## 2023-01-30 DIAGNOSIS — N2581 Secondary hyperparathyroidism of renal origin: Secondary | ICD-10-CM | POA: Diagnosis not present

## 2023-01-30 DIAGNOSIS — D509 Iron deficiency anemia, unspecified: Secondary | ICD-10-CM | POA: Diagnosis not present

## 2023-01-30 DIAGNOSIS — N186 End stage renal disease: Secondary | ICD-10-CM | POA: Diagnosis not present

## 2023-01-30 DIAGNOSIS — Z992 Dependence on renal dialysis: Secondary | ICD-10-CM | POA: Diagnosis not present

## 2023-01-30 DIAGNOSIS — E1129 Type 2 diabetes mellitus with other diabetic kidney complication: Secondary | ICD-10-CM | POA: Diagnosis not present

## 2023-01-30 DIAGNOSIS — D631 Anemia in chronic kidney disease: Secondary | ICD-10-CM | POA: Diagnosis not present

## 2023-01-31 ENCOUNTER — Encounter: Payer: Self-pay | Admitting: Physical Therapy

## 2023-01-31 ENCOUNTER — Ambulatory Visit: Payer: Medicare Other | Attending: Cardiology | Admitting: Physical Therapy

## 2023-01-31 DIAGNOSIS — M6281 Muscle weakness (generalized): Secondary | ICD-10-CM | POA: Diagnosis not present

## 2023-01-31 DIAGNOSIS — G8929 Other chronic pain: Secondary | ICD-10-CM | POA: Insufficient documentation

## 2023-01-31 DIAGNOSIS — R293 Abnormal posture: Secondary | ICD-10-CM | POA: Insufficient documentation

## 2023-01-31 DIAGNOSIS — R2689 Other abnormalities of gait and mobility: Secondary | ICD-10-CM | POA: Diagnosis not present

## 2023-01-31 DIAGNOSIS — M545 Low back pain, unspecified: Secondary | ICD-10-CM | POA: Insufficient documentation

## 2023-01-31 DIAGNOSIS — Z9181 History of falling: Secondary | ICD-10-CM | POA: Insufficient documentation

## 2023-01-31 DIAGNOSIS — R5381 Other malaise: Secondary | ICD-10-CM | POA: Diagnosis not present

## 2023-01-31 DIAGNOSIS — R2681 Unsteadiness on feet: Secondary | ICD-10-CM | POA: Diagnosis not present

## 2023-01-31 NOTE — Therapy (Signed)
OUTPATIENT PHYSICAL THERAPY NEURO TREATMENT    Patient Name: [name] MRN: [id number] DOB:10-28-1960, 62 y.o., male Today's Date: 01/31/2023   PCP: Merri Brunette REFERRING PROVIDER: Jetta Lout  END OF SESSION:  PT End of Session - 01/31/23 0926     Visit Number 14    Date for PT Re-Evaluation 02/05/23    PT Start Time 0927    PT Stop Time 1012    PT Time Calculation (min) 45 min    Activity Tolerance Patient tolerated treatment well    Behavior During Therapy [facility] for tasks assessed/performed             Past Medical History:  Diagnosis Date   Arthritis    Atrial fibrillation (HCC)    CHF (congestive heart failure), NYHA class III (HCC)    s/p heart transplant   Chronic kidney disease    end stage   Chronic systolic dysfunction of left ventricle    CVA (cerebral infarction)    Diabetes mellitus    PMH; Prior to heart transplant   Family history of coronary artery disease    in both parents   GERD (gastroesophageal reflux disease)    Hyperlipidemia    Hypertension    Left bundle branch block    Morbid obesity (HCC)    status post lap band   Myocardial infarction [facility])    prior to heart transplant   Nonischemic cardiomyopathy (HCC)    prior to heart transplant   Obesity (BMI 30-39.9)    Obstructive sleep apnea    no longer needs CPAP after heart transplant per pt   Premature ventricular contractions    Prostate cancer (HCC)    SOB (shortness of breath)    Past Surgical History:  Procedure Laterality Date   CARDIAC PACEMAKER PLACEMENT  09/21/2009   Biventricular implantable cardioverter-defibrillator implantation      COLONOSCOPY W/ BIOPSIES AND POLYPECTOMY     CYSTOSCOPY WITH FULGERATION N/A 04/27/2021   Procedure: CYSTOSCOPY AND CLOT EVACUATION WITH BLADDER BIOPSY/ FUGARATION OF BLADDER/ FULGARATION OF PROSTATE ;  Surgeon: Jerilee Field, MD;  Location: [facility];  Service: Urology;  Laterality: N/A;   FOOT SURGERY     left   HEART TRANSPLANT   2014   LAPAROSCOPIC GASTRIC BANDING  01/27/2007   LEFT VENTRICULAR ASSIST DEVICE     implanted at [facility]   PACEMAKER REMOVAL     PROSTATE BIOPSY     TOTAL HIP ARTHROPLASTY Right 07/22/2017   Procedure: RIGHT TOTAL HIP ARTHROPLASTY ANTERIOR APPROACH;  Surgeon: Samson Frederic, MD;  Location: [facility] OR;  Service: Orthopedics;  Laterality: Right;  Needs RNFA   TOTAL KNEE ARTHROPLASTY Right 07/22/2017   Patient Active Problem List   Diagnosis Date Noted   AMS (altered mental status) 11/29/2022   Aspiration pneumonia (HCC) 11/29/2022   PNA (pneumonia) 11/29/2022   Peripheral autonomic neuropathy due to DM (HCC) 11/01/2022   Orthostatic hypotension 11/01/2022   Acute encephalopathy 07/04/2022   Chronic kidney disease, stage 5 (HCC) 11/14/2021   Gait abnormality 10/12/2021   Polyneuropathy associated with underlying disease (HCC) 10/12/2021   Neuropathic pain 10/12/2021   Morbid obesity (HCC) 11/19/2019   Sleep disorder 10/22/2019   Sleep disturbance 10/22/2019   Abnormality of gait 06/09/2019   Diabetic polyneuropathy associated with diabetes mellitus due to underlying condition (HCC) 04/14/2019   Peripheral neuropathy 12/11/2018   CVA (cerebral vascular accident) (HCC) 11/09/2018   H/O heart transplant (HCC) 11/09/2018   Malignant neoplasm of prostate (HCC) 05/06/2018  Closed posterior dislocation of hip, right, initial encounter (HCC) 08/24/2017   End stage renal disease on dialysis (HCC) 08/24/2017   Hip dislocation, right (HCC) 08/24/2017   Osteoarthritis of right hip 07/22/2017   VENTRICULAR TACHYCARDIA 12/21/2009   Dyslipidemia 08/30/2009   Essential hypertension 08/30/2009   ISCHEMIC CARDIOMYOPATHY 08/30/2009   Atrial fibrillation (HCC) 08/30/2009   VENTRICULAR ECTOPY 08/30/2009   History of CVA (cerebrovascular accident) 08/30/2009   Obesity, Class III, BMI 40-49.9 (morbid obesity) (HCC) 08/30/2009   DM type 2 with diabetic peripheral neuropathy (HCC) 10/17/2007    OBSTRUCTIVE SLEEP APNEA 10/17/2007   CARDIOMYOPATHY, DILATED 10/17/2007    ONSET DATE: 10/31/22  REFERRING DIAG:  D84.9 (ICD-10-CM) - Immunodeficiency, unspecified Z94.1 (ICD-10-CM) - Heart transplant status Z48.298 (ICD-10-CM) - Encounter for aftercare following other organ transplant Z79.899 (ICD-10-CM) - Other long term (current) drug therapy G20.C (ICD-10-CM) - Parkinsonism, unspecified  THERAPY DIAG:  Physical deconditioning  Muscle weakness (generalized)  Unsteadiness on feet  Abnormal posture  Rationale for Evaluation and Treatment: Rehabilitation  SUBJECTIVE:                                                                                                                                                                                             SUBJECTIVE STATEMENT: Ok after last time,  just sore   Pt accompanied by: significant other  PERTINENT HISTORY: heart transplant 7 years ago  On dialysis MWF, heart transplant 6 yrs ago, ESRD, prostate CA, OSA, obesity, CHF, GERD, had DM, CVA with residual left hand weakness, peripheral neuropathy, R THA, AFib   PAIN:  Are you having pain? 0/10 R side     PRECAUTIONS: None  WEIGHT BEARING RESTRICTIONS: No  FALLS: Has patient fallen in last 6 months? Yes. Number of falls more than 10  LIVING ENVIRONMENT: Lives with: lives with their family Lives in: House/apartment Stairs: No Has following equipment at home: Environmental consultant - 2 wheeled, Wheelchair (manual), shower chair, and Grab bars  PLOF: Independent with household mobility with device and Independent with community mobility with device  PATIENT GOALS: want to be able to get him to stand and be more mobile and build his strength  OBJECTIVE:   COGNITION: Overall cognitive status: Impaired and History of cognitive impairments - at baseline   SENSATION: WFL   POSTURE: rounded shoulders, forward head, increased thoracic kyphosis, and flexed trunk   LOWER EXTREMITY  ROM:   limited with all motions due to weakness   LOWER EXTREMITY MMT:    MMT Right Eval Left Eval  Hip flexion 2- 2-  Hip extension    Hip abduction 2+ 2+  Hip  adduction 2+ 2+  Hip internal rotation    Hip external rotation    Knee flexion    Knee extension 2- 2+  Ankle dorsiflexion 3+ 3+  Ankle plantarflexion    Ankle inversion    Ankle eversion    (Blank rows = not tested)  TRANSFERS: Assistive device utilized: Environmental consultant - 2 wheeled  Sit to stand: Max A  GAIT: Level of assistance: Max A as wife reports Comments: unable to assess. Wife reports he takes a few steps in the room with walker  FUNCTIONAL TESTS:  5 times sit to stand: unable to do   01/08/23 26.41sec 12/13/22 TUG = 36 seconds , 01/01/23 TUG = 26 seconds   TODAY'S TREATMENT:                                                                                                                              DATE:  01/31/23 Nustep level 5 x 7 minutes Gait 3 laps ~ 320 feet indoors w/ RW some low back tightness after 256ft Volleyball in standing Leg curls 45# 3x10 Leg extension 10# 3x10, SL 5lb 2x10 each  Leg press 60lb 3x10  Standing shoulder Ext green 2x15    01/24/23 Gait out the back door down a curb then around to the front of the building, we had to take about 8 standing rest breaks mostly due to back tightness Nustep level 5 x 7 minutes 15# straight arm pulls 2x10 Leg curls 45# 3x10 Leg extension 10# 3x10 Volleyball in standing Standing 6" toe touches alternating Seated rows 25# 2x10 Lats 25# 2x10  01/22/23 Leg curls 35# 3x10 10# Leg extension 3x10 15# straight arm pulls Gait 220' with walker and SBA no rest With walker 2.5 # marches 2.5# hip abduction Leg press 60# 3x10, small ROM Calf press 20# 3x10 Nustep level 6 x 6 minutes Using 2 SPC's standing 4" toe taps Walking 25' with 2 SPC's  01/15/23 NuStep L5 x 7 min Alt 6in box taps w/ RW 10# row and straight arm pulls in standing HS curls 55lb x10,  45lb 2x10  10# leg extension 3x10 Fitter press all bands x10 each   01/08/23 Nustep level 5 x 10 minutes LE MMT   R quad 4/5, HS 3+  L quad 4/5, HS 3+ TUG 26 sec 01/01/23 5x sit to stands 26.41 sec  35# LEg curls x15, 45lb 2x15 10# leg extension 3x10 10# row and straight arm pulls in standing   01/03/23 Nustep level 5 x 9 minutes 10# row and straight arm pulls in standing Standing volleyball back of knees touching mat 35# LEg curls 3x10 10# leg extension 3x10 Gait with FWW 220 feet with SBA stopped due to back pain Blue tband clamshells Standing reaching working on balance  01/01/23 10# row and straight arm pulls 2x10 each Nustep level 5 x 8 minutes 35# leg curls 3x10 5# leg extension 3x10 2# standing marches with walker 2# standing hip  abduction with walker TUG 26 seconds with FWW Blue tband seated clamshells Standing volleyball back of knees touching the mat table Gait with CGA 200 feet  12/27/22 S2S 2x10 elevated surface with UE assist rest during second set NuStep L 5 x 7 min  Rows and Ext blue Standing 2x15 Seated volleyball Leg curls 45# 3x10 Leg ext 10# 3 x10 Standing with reaches  PATIENT EDUCATION: Education details: POC and HEP  Person educated: Patient and Spouse Education method: Explanation Education comprehension: verbalized understanding  HOME EXERCISE PROGRAM: Access Code: [email or web address] URL: [email or web address] Date: 11/13/2022 Prepared by: Cassie Freer  Exercises - Seated March  - 1 x daily - 7 x weekly - 2 sets - 10 reps - Seated Knee Extension with Anchored Resistance  - 1 x daily - 7 x weekly - 2 sets - 10 reps - Seated Hip Abduction with Resistance  - 1 x daily - 7 x weekly - 2 sets - 10 reps - Seated Hip Adduction Isometrics with Ball  - 1 x daily - 7 x weekly - 2 sets - 10 reps - Seated Shoulder Horizontal Abduction with Resistance  - 1 x daily - 7 x weekly - 2 sets - 10 reps  GOALS: Goals reviewed with patient? No  SHORT  TERM GOALS: Target date: 12/25/22  Patient will be independent with initial HEP. Goal status: met 01/03/23  2.  Patient will demonstrate improved sitting posture in wheelchair Baseline: increased kyphosis and flexed trunk Goal status: progressing 12/13/22  3.  Patient will be modA with transfers Baseline: maxA Goal status: met 12/13/22   LONG TERM GOALS: Target date: 02/05/23  Patient will be independent with advanced/ongoing HEP to improve outcomes and carryover.  Goal status: INITIAL  2.  Patient will demonstrate improved functional LE strength as demonstrated by 4 or better. Baseline: 2/5  Goal status: Progressing 01/22/23  3.  Patient will be independent with sit to stand from wheelchair Baseline: maxA Goal status: depending on seat height doing much better 01/08/23  4.  Patient will be able to take at least 5 steps with rolling walker minA Baseline: per wife can take a few steps with modA Goal status: met 01/01/23   ASSESSMENT:  CLINICAL IMPRESSION: Really worked hard today,continuee with gait but indoor due to weather had c/o tightness in the lumbar mms after ~ 240ft.  LE fatigue present with leg press intervention, cues needed to control the eccentric phase of the motion. Pt givent times during session to rest due to some GI issues he reported.  OBJECTIVE IMPAIRMENTS: decreased activity tolerance, decreased coordination, decreased endurance, decreased knowledge of condition, decreased mobility, difficulty walking, decreased strength, improper body mechanics, postural dysfunction, and obesity.   ACTIVITY LIMITATIONS: sitting, standing, squatting, stairs, transfers, bed mobility, bathing, toileting, dressing, hygiene/grooming, and locomotion level  PERSONAL FACTORS: Age, Behavior pattern, Fitness, Past/current experiences, Time since onset of injury/illness/exacerbation, and 3+ comorbidities: hx of stroke, heart and kidney transplant, hx of falls, dementia, DM, obesity, ESRD, Afib   are also affecting patient's functional outcome.   REHAB POTENTIAL: Fair    CLINICAL DECISION MAKING: Evolving/moderate complexity  EVALUATION COMPLEXITY: Moderate   PLAN:  PT FREQUENCY: 2x/week  PT DURATION: 12 weeks  PLANNED INTERVENTIONS: Therapeutic exercises, Therapeutic activity, Neuromuscular re-education, Balance training, Gait training, Patient/Family education, Self Care, Joint mobilization, Stair training, Wheelchair mobility training, Cryotherapy, Moist heat, and Manual therapy  PLAN FOR NEXT SESSION: continue to push the function, see how tired he was after tdoay  Grayce Sessions,  PTA 01/31/2023, 9:26 AM

## 2023-02-01 DIAGNOSIS — N2581 Secondary hyperparathyroidism of renal origin: Secondary | ICD-10-CM | POA: Diagnosis not present

## 2023-02-01 DIAGNOSIS — D509 Iron deficiency anemia, unspecified: Secondary | ICD-10-CM | POA: Diagnosis not present

## 2023-02-01 DIAGNOSIS — Z992 Dependence on renal dialysis: Secondary | ICD-10-CM | POA: Diagnosis not present

## 2023-02-01 DIAGNOSIS — D631 Anemia in chronic kidney disease: Secondary | ICD-10-CM | POA: Diagnosis not present

## 2023-02-01 DIAGNOSIS — N186 End stage renal disease: Secondary | ICD-10-CM | POA: Diagnosis not present

## 2023-02-01 DIAGNOSIS — E1129 Type 2 diabetes mellitus with other diabetic kidney complication: Secondary | ICD-10-CM | POA: Diagnosis not present

## 2023-02-04 DIAGNOSIS — N186 End stage renal disease: Secondary | ICD-10-CM | POA: Diagnosis not present

## 2023-02-04 DIAGNOSIS — F6381 Intermittent explosive disorder: Secondary | ICD-10-CM | POA: Diagnosis not present

## 2023-02-04 DIAGNOSIS — Z992 Dependence on renal dialysis: Secondary | ICD-10-CM | POA: Diagnosis not present

## 2023-02-04 DIAGNOSIS — N2581 Secondary hyperparathyroidism of renal origin: Secondary | ICD-10-CM | POA: Diagnosis not present

## 2023-02-04 DIAGNOSIS — E1129 Type 2 diabetes mellitus with other diabetic kidney complication: Secondary | ICD-10-CM | POA: Diagnosis not present

## 2023-02-04 DIAGNOSIS — F02818 Dementia in other diseases classified elsewhere, unspecified severity, with other behavioral disturbance: Secondary | ICD-10-CM | POA: Diagnosis not present

## 2023-02-04 DIAGNOSIS — F411 Generalized anxiety disorder: Secondary | ICD-10-CM | POA: Diagnosis not present

## 2023-02-04 DIAGNOSIS — F331 Major depressive disorder, recurrent, moderate: Secondary | ICD-10-CM | POA: Diagnosis not present

## 2023-02-04 DIAGNOSIS — D509 Iron deficiency anemia, unspecified: Secondary | ICD-10-CM | POA: Diagnosis not present

## 2023-02-04 DIAGNOSIS — D631 Anemia in chronic kidney disease: Secondary | ICD-10-CM | POA: Diagnosis not present

## 2023-02-05 ENCOUNTER — Encounter: Payer: Self-pay | Admitting: Physical Therapy

## 2023-02-05 ENCOUNTER — Ambulatory Visit: Payer: Medicare Other | Admitting: Physical Therapy

## 2023-02-05 DIAGNOSIS — R2689 Other abnormalities of gait and mobility: Secondary | ICD-10-CM | POA: Diagnosis not present

## 2023-02-05 DIAGNOSIS — N2581 Secondary hyperparathyroidism of renal origin: Secondary | ICD-10-CM | POA: Diagnosis not present

## 2023-02-05 DIAGNOSIS — R293 Abnormal posture: Secondary | ICD-10-CM

## 2023-02-05 DIAGNOSIS — Z992 Dependence on renal dialysis: Secondary | ICD-10-CM | POA: Diagnosis not present

## 2023-02-05 DIAGNOSIS — N186 End stage renal disease: Secondary | ICD-10-CM | POA: Diagnosis not present

## 2023-02-05 DIAGNOSIS — E8770 Fluid overload, unspecified: Secondary | ICD-10-CM | POA: Diagnosis not present

## 2023-02-05 DIAGNOSIS — M6281 Muscle weakness (generalized): Secondary | ICD-10-CM

## 2023-02-05 DIAGNOSIS — R2681 Unsteadiness on feet: Secondary | ICD-10-CM

## 2023-02-05 DIAGNOSIS — R5381 Other malaise: Secondary | ICD-10-CM | POA: Diagnosis not present

## 2023-02-05 DIAGNOSIS — Z9181 History of falling: Secondary | ICD-10-CM

## 2023-02-05 NOTE — Therapy (Signed)
OUTPATIENT PHYSICAL THERAPY NEURO TREATMENT    Patient Name: [name] MRN: [id number] DOB:[birth year], 62 y.o., male Today's Date: 02/05/2023   PCP: Merri Brunette REFERRING PROVIDER: Jetta Lout  END OF SESSION:  PT End of Session - 02/05/23 1015     Visit Number 15    Date for PT Re-Evaluation 03/07/23    PT Start Time 1015    PT Stop Time 1100    PT Time Calculation (min) 45 min    Activity Tolerance Patient tolerated treatment well    Behavior During Therapy WFL for tasks assessed/performed             Past Medical History:  Diagnosis Date   Arthritis    Atrial fibrillation (HCC)    CHF (congestive heart failure), NYHA class III (HCC)    s/p heart transplant   Chronic kidney disease    end stage   Chronic systolic dysfunction of left ventricle    CVA (cerebral infarction)    Diabetes mellitus    PMH; Prior to heart transplant   Family history of coronary artery disease    in both parents   GERD (gastroesophageal reflux disease)    Hyperlipidemia    Hypertension    Left bundle branch block    Morbid obesity (HCC)    status post lap band   Myocardial infarction [facility])    prior to heart transplant   Nonischemic cardiomyopathy (HCC)    prior to heart transplant   Obesity (BMI 30-39.9)    Obstructive sleep apnea    no longer needs CPAP after heart transplant per pt   Premature ventricular contractions    Prostate cancer (HCC)    SOB (shortness of breath)    Past Surgical History:  Procedure Laterality Date   CARDIAC PACEMAKER PLACEMENT  09/21/2009   Biventricular implantable cardioverter-defibrillator implantation      COLONOSCOPY W/ BIOPSIES AND POLYPECTOMY     CYSTOSCOPY WITH FULGERATION N/A 04/27/2021   Procedure: CYSTOSCOPY AND CLOT EVACUATION WITH BLADDER BIOPSY/ FUGARATION OF BLADDER/ FULGARATION OF PROSTATE ;  Surgeon: Jerilee Field, MD;  Location: [facility];  Service: Urology;  Laterality: N/A;   FOOT SURGERY     left   HEART  TRANSPLANT  2014   LAPAROSCOPIC GASTRIC BANDING  01/27/2007   LEFT VENTRICULAR ASSIST DEVICE     implanted at [facility]   PACEMAKER REMOVAL     PROSTATE BIOPSY     TOTAL HIP ARTHROPLASTY Right 07/22/2017   Procedure: RIGHT TOTAL HIP ARTHROPLASTY ANTERIOR APPROACH;  Surgeon: Samson Frederic, MD;  Location: [facility];  Service: Orthopedics;  Laterality: Right;  Needs RNFA   TOTAL KNEE ARTHROPLASTY Right 07/22/2017   Patient Active Problem List   Diagnosis Date Noted   AMS (altered mental status) 11/29/2022   Aspiration pneumonia (HCC) 11/29/2022   PNA (pneumonia) 11/29/2022   Peripheral autonomic neuropathy due to DM (HCC) 11/01/2022   Orthostatic hypotension 11/01/2022   Acute encephalopathy 07/04/2022   Chronic kidney disease, stage 5 (HCC) 11/14/2021   Gait abnormality 10/12/2021   Polyneuropathy associated with underlying disease (HCC) 10/12/2021   Neuropathic pain 10/12/2021   Morbid obesity (HCC) 11/19/2019   Sleep disorder 10/22/2019   Sleep disturbance 10/22/2019   Abnormality of gait 06/09/2019   Diabetic polyneuropathy associated with diabetes mellitus due to underlying condition (HCC) 04/14/2019   Peripheral neuropathy 12/11/2018   CVA (cerebral vascular accident) (HCC) 11/09/2018   H/O heart transplant (HCC) 11/09/2018   Malignant neoplasm of prostate (HCC) 05/06/2018  Closed posterior dislocation of hip, right, initial encounter (HCC) 08/24/2017   End stage renal disease on dialysis (HCC) 08/24/2017   Hip dislocation, right (HCC) 08/24/2017   Osteoarthritis of right hip 07/22/2017   VENTRICULAR TACHYCARDIA 12/21/2009   Dyslipidemia 08/30/2009   Essential hypertension 08/30/2009   ISCHEMIC CARDIOMYOPATHY 08/30/2009   Atrial fibrillation (HCC) 08/30/2009   VENTRICULAR ECTOPY 08/30/2009   History of CVA (cerebrovascular accident) 08/30/2009   Obesity, Class III, BMI 40-49.9 (morbid obesity) (HCC) 08/30/2009   DM type 2 with diabetic peripheral neuropathy (HCC) 10/17/2007    OBSTRUCTIVE SLEEP APNEA 10/17/2007   CARDIOMYOPATHY, DILATED 10/17/2007    ONSET DATE: 10/31/22  REFERRING DIAG:  D84.9 (ICD-10-CM) - Immunodeficiency, unspecified Z94.1 (ICD-10-CM) - Heart transplant status Z48.298 (ICD-10-CM) - Encounter for aftercare following other organ transplant Z79.899 (ICD-10-CM) - Other long term (current) drug therapy G20.C (ICD-10-CM) - Parkinsonism, unspecified  THERAPY DIAG:  Physical deconditioning  Muscle weakness (generalized)  Unsteadiness on feet  Abnormal posture  History of falling  Rationale for Evaluation and Treatment: Rehabilitation  SUBJECTIVE:                                                                                                                                                                                             SUBJECTIVE STATEMENT: Pt reported a fall the night before last, pt reports hits L hip got weak  Pt accompanied by: significant other  PERTINENT HISTORY: heart transplant 7 years ago  On dialysis MWF, heart transplant 6 yrs ago, ESRD, prostate CA, OSA, obesity, CHF, GERD, had DM, CVA with residual left hand weakness, peripheral neuropathy, R THA, AFib   PAIN:  Are you having pain? 0/10 R side     PRECAUTIONS: None  WEIGHT BEARING RESTRICTIONS: No  FALLS: Has patient fallen in last 6 months? Yes. Number of falls more than 10  LIVING ENVIRONMENT: Lives with: lives with their family Lives in: House/apartment Stairs: No Has following equipment at home: Environmental consultant - 2 wheeled, Wheelchair (manual), shower chair, and Grab bars  PLOF: Independent with household mobility with device and Independent with community mobility with device  PATIENT GOALS: want to be able to get him to stand and be more mobile and build his strength  OBJECTIVE:   COGNITION: Overall cognitive status: Impaired and History of cognitive impairments - at baseline   SENSATION: WFL   POSTURE: rounded shoulders, forward head,  increased thoracic kyphosis, and flexed trunk   LOWER EXTREMITY ROM:   limited with all motions due to weakness   LOWER EXTREMITY MMT:    MMT Right Eval Left Eval  Hip flexion 2- 2-  Hip extension    Hip abduction 2+ 2+  Hip adduction 2+ 2+  Hip internal rotation    Hip external rotation    Knee flexion    Knee extension 2- 2+  Ankle dorsiflexion 3+ 3+  Ankle plantarflexion    Ankle inversion    Ankle eversion    (Blank rows = not tested)  TRANSFERS: Assistive device utilized: Environmental consultant - 2 wheeled  Sit to stand: Max A  GAIT: Level of assistance: Max A as wife reports Comments: unable to assess. Wife reports he takes a few steps in the room with walker  FUNCTIONAL TESTS:  5 times sit to stand: unable to do   01/08/23 26.41sec 12/13/22 TUG = 36 seconds , 01/01/23 TUG = 26 seconds   TODAY'S TREATMENT:                                                                                                                              DATE:  02/05/23 Gait out the back door down a curb then around to the front of the building, we had to take 1 standing rest breaks and one seated rest mostly due to back tightness and L hip pain Nustep level 5 x 7 minutes Leg curls 45# 2x12 Leg extension 10# 2x12, SL 5lb 2x10 each  Standing shoulder Ext green 2x15   01/31/23 Nustep level 5 x 7 minutes Gait 3 laps ~ 320 feet indoors w/ RW some low back tightness after 249ft Volleyball in standing Leg curls 45# 3x10 Leg extension 10# 3x10, SL 5lb 2x10 each  Leg press 60lb 3x10  Standing shoulder Ext green 2x15    01/24/23 Gait out the back door down a curb then around to the front of the building, we had to take about 8 standing rest breaks mostly due to back tightness Nustep level 5 x 7 minutes 15# straight arm pulls 2x10 Leg curls 45# 3x10 Leg extension 10# 3x10 Volleyball in standing Standing 6" toe touches alternating Seated rows 25# 2x10 Lats 25# 2x10  01/22/23 Leg curls 35# 3x10 10# Leg  extension 3x10 15# straight arm pulls Gait 220' with walker and SBA no rest With walker 2.5 # marches 2.5# hip abduction Leg press 60# 3x10, small ROM Calf press 20# 3x10 Nustep level 6 x 6 minutes Using 2 SPC's standing 4" toe taps Walking 25' with 2 SPC's  01/15/23 NuStep L5 x 7 min Alt 6in box taps w/ RW 10# row and straight arm pulls in standing HS curls 55lb x10, 45lb 2x10  10# leg extension 3x10 Fitter press all bands x10 each   01/08/23 Nustep level 5 x 10 minutes LE MMT   R quad 4/5, HS 3+  L quad 4/5, HS 3+ TUG 26 sec 01/01/23 5x sit to stands 26.41 sec  35# LEg curls x15, 45lb 2x15 10# leg extension 3x10 10# row and straight arm pulls in standing   01/03/23 Nustep level 5 x 9 minutes  10# row and straight arm pulls in standing Standing volleyball back of knees touching mat 35# LEg curls 3x10 10# leg extension 3x10 Gait with FWW 220 feet with SBA stopped due to back pain Blue tband clamshells Standing reaching working on balance  01/01/23 10# row and straight arm pulls 2x10 each Nustep level 5 x 8 minutes 35# leg curls 3x10 5# leg extension 3x10 2# standing marches with walker 2# standing hip abduction with walker TUG 26 seconds with FWW Blue tband seated clamshells Standing volleyball back of knees touching the mat table Gait with CGA 200 feet  12/27/22 S2S 2x10 elevated surface with UE assist rest during second set NuStep L 5 x 7 min  Rows and Ext blue Standing 2x15 Seated volleyball Leg curls 45# 3x10 Leg ext 10# 3 x10 Standing with reaches  PATIENT EDUCATION: Education details: POC and HEP  Person educated: Patient and Spouse Education method: Explanation Education comprehension: verbalized understanding  HOME EXERCISE PROGRAM: Access Code: [email or web address] URL: [email or web address] Date: 11/13/2022 Prepared by: Cassie Freer  Exercises - Seated March  - 1 x daily - 7 x weekly - 2 sets - 10 reps - Seated Knee Extension with Anchored  Resistance  - 1 x daily - 7 x weekly - 2 sets - 10 reps - Seated Hip Abduction with Resistance  - 1 x daily - 7 x weekly - 2 sets - 10 reps - Seated Hip Adduction Isometrics with Ball  - 1 x daily - 7 x weekly - 2 sets - 10 reps - Seated Shoulder Horizontal Abduction with Resistance  - 1 x daily - 7 x weekly - 2 sets - 10 reps  GOALS: Goals reviewed with patient? No  SHORT TERM GOALS: Target date: 12/25/22  Patient will be independent with initial HEP. Goal status: met 01/03/23  2.  Patient will demonstrate improved sitting posture in wheelchair Baseline: increased kyphosis and flexed trunk Goal status: progressing can correct with cues 02/05/23  3.  Patient will be modA with transfers Baseline: maxA Goal status: met 12/13/22   LONG TERM GOALS: Target date: 02/05/23  Patient will be independent with advanced/ongoing HEP to improve outcomes and carryover.  Goal status: INITIAL  2.  Patient will demonstrate improved functional LE strength as demonstrated by 4 or better. Baseline: 2/5  Goal status: Progressing 01/22/23  3.  Patient will be independent with sit to stand from wheelchair Baseline: maxA Goal status: Met 02/05/23  4.  Patient will be able to take at least 5 steps with rolling walker minA Baseline: per wife can take a few steps with modA Goal status: met 01/01/23   ASSESSMENT:  CLINICAL IMPRESSION: Really worked hard today,  gait trial outdoors again pt required one seated rest at  ~ 240ft, followed by a seated rest break at 381ft. Pt reported some L hip pain and low back tightness. Pt givent cues cues throughout session to correct posture. Increase reps per set tolerated with leg curls and extensions. Pt is really edgar to increase his functional strength and endurance.  OBJECTIVE IMPAIRMENTS: decreased activity tolerance, decreased coordination, decreased endurance, decreased knowledge of condition, decreased mobility, difficulty walking, decreased strength, improper body  mechanics, postural dysfunction, and obesity.   ACTIVITY LIMITATIONS: sitting, standing, squatting, stairs, transfers, bed mobility, bathing, toileting, dressing, hygiene/grooming, and locomotion level  PERSONAL FACTORS: Age, Behavior pattern, Fitness, Past/current experiences, Time since onset of injury/illness/exacerbation, and 3+ comorbidities: hx of stroke, heart and kidney transplant, hx of falls, dementia, DM, obesity, ESRD, Afib  are also affecting patient's functional outcome.   REHAB POTENTIAL: Fair    CLINICAL DECISION MAKING: Evolving/moderate complexity  EVALUATION COMPLEXITY: Moderate   PLAN:  PT FREQUENCY: 2x/week  PT DURATION: 12 weeks  PLANNED INTERVENTIONS: Therapeutic exercises, Therapeutic activity, Neuromuscular re-education, Balance training, Gait training, Patient/Family education, Self Care, Joint mobilization, Stair training, Wheelchair mobility training, Cryotherapy, Moist heat, and Manual therapy  PLAN FOR NEXT SESSION: continue to push the function, see how tired he was after tdoay  Jearld Lesch, PT 02/05/2023, 1:01 PM

## 2023-02-06 DIAGNOSIS — Z992 Dependence on renal dialysis: Secondary | ICD-10-CM | POA: Diagnosis not present

## 2023-02-06 DIAGNOSIS — N186 End stage renal disease: Secondary | ICD-10-CM | POA: Diagnosis not present

## 2023-02-06 DIAGNOSIS — N2581 Secondary hyperparathyroidism of renal origin: Secondary | ICD-10-CM | POA: Diagnosis not present

## 2023-02-06 DIAGNOSIS — D631 Anemia in chronic kidney disease: Secondary | ICD-10-CM | POA: Diagnosis not present

## 2023-02-06 DIAGNOSIS — D509 Iron deficiency anemia, unspecified: Secondary | ICD-10-CM | POA: Diagnosis not present

## 2023-02-06 DIAGNOSIS — E1129 Type 2 diabetes mellitus with other diabetic kidney complication: Secondary | ICD-10-CM | POA: Diagnosis not present

## 2023-02-07 ENCOUNTER — Encounter: Payer: Self-pay | Admitting: Physical Therapy

## 2023-02-07 ENCOUNTER — Ambulatory Visit: Payer: Medicare Other | Admitting: Physical Therapy

## 2023-02-07 DIAGNOSIS — Z9181 History of falling: Secondary | ICD-10-CM | POA: Diagnosis not present

## 2023-02-07 DIAGNOSIS — R2689 Other abnormalities of gait and mobility: Secondary | ICD-10-CM | POA: Diagnosis not present

## 2023-02-07 DIAGNOSIS — R5381 Other malaise: Secondary | ICD-10-CM | POA: Diagnosis not present

## 2023-02-07 DIAGNOSIS — R293 Abnormal posture: Secondary | ICD-10-CM | POA: Diagnosis not present

## 2023-02-07 DIAGNOSIS — R2681 Unsteadiness on feet: Secondary | ICD-10-CM | POA: Diagnosis not present

## 2023-02-07 DIAGNOSIS — M6281 Muscle weakness (generalized): Secondary | ICD-10-CM | POA: Diagnosis not present

## 2023-02-07 NOTE — Therapy (Signed)
OUTPATIENT PHYSICAL THERAPY NEURO TREATMENT    Patient Name: [name] MRN: [id number] DOB:August 01, 1961, 62 y.o., male Today's Date: 02/07/2023   PCP: Merri Brunette REFERRING PROVIDER: Jetta Lout  END OF SESSION:  PT End of Session - 02/07/23 0936     Visit Number 16    Date for PT Re-Evaluation 03/07/23    PT Start Time 0930    PT Stop Time 1015    PT Time Calculation (min) 45 min    Activity Tolerance Patient tolerated treatment well    Behavior During Therapy WFL for tasks assessed/performed             Past Medical History:  Diagnosis Date   Arthritis    Atrial fibrillation (HCC)    CHF (congestive heart failure), NYHA class III (HCC)    s/p heart transplant   Chronic kidney disease    end stage   Chronic systolic dysfunction of left ventricle    CVA (cerebral infarction)    Diabetes mellitus    PMH; Prior to heart transplant   Family history of coronary artery disease    in both parents   GERD (gastroesophageal reflux disease)    Hyperlipidemia    Hypertension    Left bundle branch block    Morbid obesity (HCC)    status post lap band   Myocardial infarction [facility])    prior to heart transplant   Nonischemic cardiomyopathy (HCC)    prior to heart transplant   Obesity (BMI 30-39.9)    Obstructive sleep apnea    no longer needs CPAP after heart transplant per pt   Premature ventricular contractions    Prostate cancer (HCC)    SOB (shortness of breath)    Past Surgical History:  Procedure Laterality Date   CARDIAC PACEMAKER PLACEMENT  09/21/2009   Biventricular implantable cardioverter-defibrillator implantation      COLONOSCOPY W/ BIOPSIES AND POLYPECTOMY     CYSTOSCOPY WITH FULGERATION N/A 04/27/2021   Procedure: CYSTOSCOPY AND CLOT EVACUATION WITH BLADDER BIOPSY/ FUGARATION OF BLADDER/ FULGARATION OF PROSTATE ;  Surgeon: Jerilee Field, MD;  Location: [facility];  Service: Urology;  Laterality: N/A;   FOOT SURGERY     left   HEART  TRANSPLANT  2014   LAPAROSCOPIC GASTRIC BANDING  01/27/2007   LEFT VENTRICULAR ASSIST DEVICE     implanted at [facility]   PACEMAKER REMOVAL     PROSTATE BIOPSY     TOTAL HIP ARTHROPLASTY Right 07/22/2017   Procedure: RIGHT TOTAL HIP ARTHROPLASTY ANTERIOR APPROACH;  Surgeon: Samson Frederic, MD;  Location: [facility];  Service: Orthopedics;  Laterality: Right;  Needs RNFA   TOTAL KNEE ARTHROPLASTY Right 07/22/2017   Patient Active Problem List   Diagnosis Date Noted   AMS (altered mental status) 11/29/2022   Aspiration pneumonia (HCC) 11/29/2022   PNA (pneumonia) 11/29/2022   Peripheral autonomic neuropathy due to DM (HCC) 11/01/2022   Orthostatic hypotension 11/01/2022   Acute encephalopathy 07/04/2022   Chronic kidney disease, stage 5 (HCC) 11/14/2021   Gait abnormality 10/12/2021   Polyneuropathy associated with underlying disease (HCC) 10/12/2021   Neuropathic pain 10/12/2021   Morbid obesity (HCC) 11/19/2019   Sleep disorder 10/22/2019   Sleep disturbance 10/22/2019   Abnormality of gait 06/09/2019   Diabetic polyneuropathy associated with diabetes mellitus due to underlying condition (HCC) 04/14/2019   Peripheral neuropathy 12/11/2018   CVA (cerebral vascular accident) (HCC) 11/09/2018   H/O heart transplant (HCC) 11/09/2018   Malignant neoplasm of prostate (HCC) 05/06/2018  Closed posterior dislocation of hip, right, initial encounter (HCC) 08/24/2017   End stage renal disease on dialysis (HCC) 08/24/2017   Hip dislocation, right (HCC) 08/24/2017   Osteoarthritis of right hip 07/22/2017   VENTRICULAR TACHYCARDIA 12/21/2009   Dyslipidemia 08/30/2009   Essential hypertension 08/30/2009   ISCHEMIC CARDIOMYOPATHY 08/30/2009   Atrial fibrillation (HCC) 08/30/2009   VENTRICULAR ECTOPY 08/30/2009   History of CVA (cerebrovascular accident) 08/30/2009   Obesity, Class III, BMI 40-49.9 (morbid obesity) (HCC) 08/30/2009   DM type 2 with diabetic peripheral neuropathy (HCC) 10/17/2007    OBSTRUCTIVE SLEEP APNEA 10/17/2007   CARDIOMYOPATHY, DILATED 10/17/2007    ONSET DATE: 10/31/22  REFERRING DIAG:  D84.9 (ICD-10-CM) - Immunodeficiency, unspecified Z94.1 (ICD-10-CM) - Heart transplant status Z48.298 (ICD-10-CM) - Encounter for aftercare following other organ transplant Z79.899 (ICD-10-CM) - Other long term (current) drug therapy G20.C (ICD-10-CM) - Parkinsonism, unspecified  THERAPY DIAG:  Physical deconditioning  Unsteadiness on feet  Muscle weakness (generalized)  Abnormal posture  History of falling  Rationale for Evaluation and Treatment: Rehabilitation  SUBJECTIVE:                                                                                                                                                                                             SUBJECTIVE STATEMENT: Doing all right, Felt ok after last session  Pt accompanied by: significant other  PERTINENT HISTORY: heart transplant 7 years ago  On dialysis MWF, heart transplant 6 yrs ago, ESRD, prostate CA, OSA, obesity, CHF, GERD, had DM, CVA with residual left hand weakness, peripheral neuropathy, R THA, AFib   PAIN:  Are you having pain? 0/10 R side     PRECAUTIONS: None  WEIGHT BEARING RESTRICTIONS: No  FALLS: Has patient fallen in last 6 months? Yes. Number of falls more than 10  LIVING ENVIRONMENT: Lives with: lives with their family Lives in: House/apartment Stairs: No Has following equipment at home: Environmental consultant - 2 wheeled, Wheelchair (manual), shower chair, and Grab bars  PLOF: Independent with household mobility with device and Independent with community mobility with device  PATIENT GOALS: want to be able to get him to stand and be more mobile and build his strength  OBJECTIVE:   COGNITION: Overall cognitive status: Impaired and History of cognitive impairments - at baseline   SENSATION: WFL   POSTURE: rounded shoulders, forward head, increased thoracic kyphosis, and  flexed trunk   LOWER EXTREMITY ROM:   limited with all motions due to weakness   LOWER EXTREMITY MMT:    MMT Right Eval Left Eval  Hip flexion 2- 2-  Hip extension    Hip abduction  2+ 2+  Hip adduction 2+ 2+  Hip internal rotation    Hip external rotation    Knee flexion    Knee extension 2- 2+  Ankle dorsiflexion 3+ 3+  Ankle plantarflexion    Ankle inversion    Ankle eversion    (Blank rows = not tested)  TRANSFERS: Assistive device utilized: Environmental consultant - 2 wheeled  Sit to stand: Max A  GAIT: Level of assistance: Max A as wife reports Comments: unable to assess. Wife reports he takes a few steps in the room with walker  FUNCTIONAL TESTS:  5 times sit to stand: unable to do   01/08/23 26.41sec 12/13/22 TUG = 36 seconds , 01/01/23 TUG = 26 seconds   TODAY'S TREATMENT:                                                                                                                              DATE:  02/07/23 Nustep level 5 x 7 minutes S2S from elevated surface 2x10  Side step length of mat table x2 Standing ball Toss  Gait 3 laps w/ RW ~320 feet one standing  rest Leg curls 45# 2x12 Leg extension 10# 2x12, SL 5lb 2x12 each  MHP Lumbar spine x10 min  02/05/23 Gait out the back door down a curb then around to the front of the building, we had to take 1 standing rest breaks and one seated rest mostly due to back tightness and L hip pain Nustep level 5 x 7 minutes Leg curls 45# 2x12 Leg extension 10# 2x12, SL 5lb 2x10 each  Standing shoulder Ext green 2x15   01/31/23 Nustep level 5 x 7 minutes Gait 3 laps ~ 320 feet indoors w/ RW some low back tightness after 261ft Volleyball in standing Leg curls 45# 3x10 Leg extension 10# 3x10, SL 5lb 2x10 each  Leg press 60lb 3x10  Standing shoulder Ext green 2x15    01/24/23 Gait out the back door down a curb then around to the front of the building, we had to take about 8 standing rest breaks mostly due to back tightness Nustep  level 5 x 7 minutes 15# straight arm pulls 2x10 Leg curls 45# 3x10 Leg extension 10# 3x10 Volleyball in standing Standing 6" toe touches alternating Seated rows 25# 2x10 Lats 25# 2x10  01/22/23 Leg curls 35# 3x10 10# Leg extension 3x10 15# straight arm pulls Gait 220' with walker and SBA no rest With walker 2.5 # marches 2.5# hip abduction Leg press 60# 3x10, small ROM Calf press 20# 3x10 Nustep level 6 x 6 minutes Using 2 SPC's standing 4" toe taps Walking 25' with 2 SPC's  01/15/23 NuStep L5 x 7 min Alt 6in box taps w/ RW 10# row and straight arm pulls in standing HS curls 55lb x10, 45lb 2x10  10# leg extension 3x10 Fitter press all bands x10 each   01/08/23 Nustep level 5 x 10 minutes LE MMT   R quad  4/5, HS 3+  L quad 4/5, HS 3+ TUG 26 sec 01/01/23 5x sit to stands 26.41 sec  35# LEg curls x15, 45lb 2x15 10# leg extension 3x10 10# row and straight arm pulls in standing   01/03/23 Nustep level 5 x 9 minutes 10# row and straight arm pulls in standing Standing volleyball back of knees touching mat 35# LEg curls 3x10 10# leg extension 3x10 Gait with FWW 220 feet with SBA stopped due to back pain Blue tband clamshells Standing reaching working on balance  01/01/23 10# row and straight arm pulls 2x10 each Nustep level 5 x 8 minutes 35# leg curls 3x10 5# leg extension 3x10 2# standing marches with walker 2# standing hip abduction with walker TUG 26 seconds with FWW Blue tband seated clamshells Standing volleyball back of knees touching the mat table Gait with CGA 200 feet  PATIENT EDUCATION: Education details: POC and HEP  Person educated: Patient and Spouse Education method: Explanation Education comprehension: verbalized understanding  HOME EXERCISE PROGRAM: Access Code: [email or web address] URL: [email or web address] Date: 11/13/2022 Prepared by: Cassie Freer  Exercises - Seated March  - 1 x daily - 7 x weekly - 2 sets - 10 reps - Seated Knee  Extension with Anchored Resistance  - 1 x daily - 7 x weekly - 2 sets - 10 reps - Seated Hip Abduction with Resistance  - 1 x daily - 7 x weekly - 2 sets - 10 reps - Seated Hip Adduction Isometrics with Ball  - 1 x daily - 7 x weekly - 2 sets - 10 reps - Seated Shoulder Horizontal Abduction with Resistance  - 1 x daily - 7 x weekly - 2 sets - 10 reps  GOALS: Goals reviewed with patient? No  SHORT TERM GOALS: Target date: 12/25/22  Patient will be independent with initial HEP. Goal status: met 01/03/23  2.  Patient will demonstrate improved sitting posture in wheelchair Baseline: increased kyphosis and flexed trunk Goal status: progressing can correct with cues 02/05/23  3.  Patient will be modA with transfers Baseline: maxA Goal status: met 12/13/22   LONG TERM GOALS: Target date: 02/05/23  Patient will be independent with advanced/ongoing HEP to improve outcomes and carryover.  Goal status: INITIAL  2.  Patient will demonstrate improved functional LE strength as demonstrated by 4 or better. Baseline: 2/5  Goal status: Progressing 01/22/23  3.  Patient will be independent with sit to stand from wheelchair Baseline: maxA Goal status: Met 02/05/23  4.  Patient will be able to take at least 5 steps with rolling walker minA Baseline: per wife can take a few steps with modA Goal status: met 01/01/23   ASSESSMENT:  CLINICAL IMPRESSION: Really worked hard today, Session focused on standing interventions, side step were difficult for pt but he was able to complete. Pt reported some L hip pain and low back tightness with gait. Pt givent cues cues throughout session to correct posture. Pt is really edgar to increase his functional strength and endurance.  OBJECTIVE IMPAIRMENTS: decreased activity tolerance, decreased coordination, decreased endurance, decreased knowledge of condition, decreased mobility, difficulty walking, decreased strength, improper body mechanics, postural dysfunction,  and obesity.   ACTIVITY LIMITATIONS: sitting, standing, squatting, stairs, transfers, bed mobility, bathing, toileting, dressing, hygiene/grooming, and locomotion level  PERSONAL FACTORS: Age, Behavior pattern, Fitness, Past/current experiences, Time since onset of injury/illness/exacerbation, and 3+ comorbidities: hx of stroke, heart and kidney transplant, hx of falls, dementia, DM, obesity, ESRD, Afib  are also affecting patient's functional outcome.  REHAB POTENTIAL: Fair    CLINICAL DECISION MAKING: Evolving/moderate complexity  EVALUATION COMPLEXITY: Moderate   PLAN:  PT FREQUENCY: 2x/week  PT DURATION: 12 weeks  PLANNED INTERVENTIONS: Therapeutic exercises, Therapeutic activity, Neuromuscular re-education, Balance training, Gait training, Patient/Family education, Self Care, Joint mobilization, Stair training, Wheelchair mobility training, Cryotherapy, Moist heat, and Manual therapy  PLAN FOR NEXT SESSION: continue to push the function, see how tired he was after tdoay  Grayce Sessions, PTA 02/07/2023, 9:37 AM

## 2023-02-08 DIAGNOSIS — N186 End stage renal disease: Secondary | ICD-10-CM | POA: Diagnosis not present

## 2023-02-08 DIAGNOSIS — E1129 Type 2 diabetes mellitus with other diabetic kidney complication: Secondary | ICD-10-CM | POA: Diagnosis not present

## 2023-02-08 DIAGNOSIS — Z992 Dependence on renal dialysis: Secondary | ICD-10-CM | POA: Diagnosis not present

## 2023-02-08 DIAGNOSIS — D631 Anemia in chronic kidney disease: Secondary | ICD-10-CM | POA: Diagnosis not present

## 2023-02-08 DIAGNOSIS — D509 Iron deficiency anemia, unspecified: Secondary | ICD-10-CM | POA: Diagnosis not present

## 2023-02-08 DIAGNOSIS — N2581 Secondary hyperparathyroidism of renal origin: Secondary | ICD-10-CM | POA: Diagnosis not present

## 2023-02-11 DIAGNOSIS — N186 End stage renal disease: Secondary | ICD-10-CM | POA: Diagnosis not present

## 2023-02-11 DIAGNOSIS — D509 Iron deficiency anemia, unspecified: Secondary | ICD-10-CM | POA: Diagnosis not present

## 2023-02-11 DIAGNOSIS — E1129 Type 2 diabetes mellitus with other diabetic kidney complication: Secondary | ICD-10-CM | POA: Diagnosis not present

## 2023-02-11 DIAGNOSIS — N2581 Secondary hyperparathyroidism of renal origin: Secondary | ICD-10-CM | POA: Diagnosis not present

## 2023-02-11 DIAGNOSIS — Z992 Dependence on renal dialysis: Secondary | ICD-10-CM | POA: Diagnosis not present

## 2023-02-11 DIAGNOSIS — D631 Anemia in chronic kidney disease: Secondary | ICD-10-CM | POA: Diagnosis not present

## 2023-02-12 ENCOUNTER — Encounter: Payer: Self-pay | Admitting: Physical Therapy

## 2023-02-12 ENCOUNTER — Ambulatory Visit: Payer: Medicare Other | Admitting: Physical Therapy

## 2023-02-12 DIAGNOSIS — R2681 Unsteadiness on feet: Secondary | ICD-10-CM

## 2023-02-12 DIAGNOSIS — G8929 Other chronic pain: Secondary | ICD-10-CM

## 2023-02-12 DIAGNOSIS — R2689 Other abnormalities of gait and mobility: Secondary | ICD-10-CM | POA: Diagnosis not present

## 2023-02-12 DIAGNOSIS — Z9181 History of falling: Secondary | ICD-10-CM | POA: Diagnosis not present

## 2023-02-12 DIAGNOSIS — R5381 Other malaise: Secondary | ICD-10-CM | POA: Diagnosis not present

## 2023-02-12 DIAGNOSIS — R293 Abnormal posture: Secondary | ICD-10-CM | POA: Diagnosis not present

## 2023-02-12 DIAGNOSIS — M6281 Muscle weakness (generalized): Secondary | ICD-10-CM | POA: Diagnosis not present

## 2023-02-12 NOTE — Therapy (Signed)
OUTPATIENT PHYSICAL THERAPY NEURO TREATMENT    Patient Name: [name] MRN: [id number] DOB:04/09/1961, 62 y.o., male Today's Date: 02/12/2023   PCP: Merri Brunette REFERRING PROVIDER: Jetta Lout  END OF SESSION:  PT End of Session - 02/12/23 0956     Visit Number 17    Date for PT Re-Evaluation 03/07/23    Authorization Type Medicare    PT Start Time 0930    PT Stop Time 1020    PT Time Calculation (min) 50 min    Equipment Utilized During Treatment Gait belt    Activity Tolerance Patient tolerated treatment well    Behavior During Therapy WFL for tasks assessed/performed             Past Medical History:  Diagnosis Date   Arthritis    Atrial fibrillation (HCC)    CHF (congestive heart failure), NYHA class III (HCC)    s/p heart transplant   Chronic kidney disease    end stage   Chronic systolic dysfunction of left ventricle    CVA (cerebral infarction)    Diabetes mellitus    PMH; Prior to heart transplant   Family history of coronary artery disease    in both parents   GERD (gastroesophageal reflux disease)    Hyperlipidemia    Hypertension    Left bundle branch block    Morbid obesity (HCC)    status post lap band   Myocardial infarction [facility])    prior to heart transplant   Nonischemic cardiomyopathy (HCC)    prior to heart transplant   Obesity (BMI 30-39.9)    Obstructive sleep apnea    no longer needs CPAP after heart transplant per pt   Premature ventricular contractions    Prostate cancer (HCC)    SOB (shortness of breath)    Past Surgical History:  Procedure Laterality Date   CARDIAC PACEMAKER PLACEMENT  09/21/2009   Biventricular implantable cardioverter-defibrillator implantation      COLONOSCOPY W/ BIOPSIES AND POLYPECTOMY     CYSTOSCOPY WITH FULGERATION N/A 04/27/2021   Procedure: CYSTOSCOPY AND CLOT EVACUATION WITH BLADDER BIOPSY/ FUGARATION OF BLADDER/ FULGARATION OF PROSTATE ;  Surgeon: Jerilee Field, MD;  Location: [facility];   Service: Urology;  Laterality: N/A;   FOOT SURGERY     left   HEART TRANSPLANT  2014   LAPAROSCOPIC GASTRIC BANDING  01/27/2007   LEFT VENTRICULAR ASSIST DEVICE     implanted at [facility]   PACEMAKER REMOVAL     PROSTATE BIOPSY     TOTAL HIP ARTHROPLASTY Right 07/22/2017   Procedure: RIGHT TOTAL HIP ARTHROPLASTY ANTERIOR APPROACH;  Surgeon: Samson Frederic, MD;  Location: [facility];  Service: Orthopedics;  Laterality: Right;  Needs RNFA   TOTAL KNEE ARTHROPLASTY Right 07/22/2017   Patient Active Problem List   Diagnosis Date Noted   AMS (altered mental status) 11/29/2022   Aspiration pneumonia (HCC) 11/29/2022   PNA (pneumonia) 11/29/2022   Peripheral autonomic neuropathy due to DM (HCC) 11/01/2022   Orthostatic hypotension 11/01/2022   Acute encephalopathy 07/04/2022   Chronic kidney disease, stage 5 (HCC) 11/14/2021   Gait abnormality 10/12/2021   Polyneuropathy associated with underlying disease (HCC) 10/12/2021   Neuropathic pain 10/12/2021   Morbid obesity (HCC) 11/19/2019   Sleep disorder 10/22/2019   Sleep disturbance 10/22/2019   Abnormality of gait 06/09/2019   Diabetic polyneuropathy associated with diabetes mellitus due to underlying condition (HCC) 04/14/2019   Peripheral neuropathy 12/11/2018   CVA (cerebral vascular accident) (HCC) 11/09/2018  H/O heart transplant (HCC) 11/09/2018   Malignant neoplasm of prostate (HCC) 05/06/2018   Closed posterior dislocation of hip, right, initial encounter (HCC) 08/24/2017   End stage renal disease on dialysis (HCC) 08/24/2017   Hip dislocation, right (HCC) 08/24/2017   Osteoarthritis of right hip 07/22/2017   VENTRICULAR TACHYCARDIA 12/21/2009   Dyslipidemia 08/30/2009   Essential hypertension 08/30/2009   ISCHEMIC CARDIOMYOPATHY 08/30/2009   Atrial fibrillation (HCC) 08/30/2009   VENTRICULAR ECTOPY 08/30/2009   History of CVA (cerebrovascular accident) 08/30/2009   Obesity, Class III, BMI 40-49.9 (morbid obesity) (HCC)  08/30/2009   DM type 2 with diabetic peripheral neuropathy (HCC) 10/17/2007   OBSTRUCTIVE SLEEP APNEA 10/17/2007   CARDIOMYOPATHY, DILATED 10/17/2007    ONSET DATE: 10/31/22  REFERRING DIAG:  D84.9 (ICD-10-CM) - Immunodeficiency, unspecified Z94.1 (ICD-10-CM) - Heart transplant status Z48.298 (ICD-10-CM) - Encounter for aftercare following other organ transplant Z79.899 (ICD-10-CM) - Other long term (current) drug therapy G20.C (ICD-10-CM) - Parkinsonism, unspecified  THERAPY DIAG:  Physical deconditioning  Unsteadiness on feet  Muscle weakness (generalized)  Abnormal posture  History of falling  Other abnormalities of gait and mobility  Chronic low back pain, unspecified back pain laterality, unspecified whether sciatica present  Rationale for Evaluation and Treatment: Rehabilitation  SUBJECTIVE:                                                                                                                                                                                             SUBJECTIVE STATEMENT: No problems , just fatigue at times and the biggest issue at home is getting in and out of the tub  Pt accompanied by: significant other  PERTINENT HISTORY: heart transplant 7 years ago  On dialysis MWF, heart transplant 6 yrs ago, ESRD, prostate CA, OSA, obesity, CHF, GERD, had DM, CVA with residual left hand weakness, peripheral neuropathy, R THA, AFib   PAIN:  Are you having pain? 0/10 R side     PRECAUTIONS: None  WEIGHT BEARING RESTRICTIONS: No  FALLS: Has patient fallen in last 6 months? Yes. Number of falls more than 10  LIVING ENVIRONMENT: Lives with: lives with their family Lives in: House/apartment Stairs: No Has following equipment at home: Environmental consultant - 2 wheeled, Wheelchair (manual), shower chair, and Grab bars  PLOF: Independent with household mobility with device and Independent with community mobility with device  PATIENT GOALS: want to be able to  get him to stand and be more mobile and build his strength  OBJECTIVE:   COGNITION: Overall cognitive status: Impaired and History of cognitive impairments - at baseline   SENSATION: WFL   POSTURE: rounded shoulders,  forward head, increased thoracic kyphosis, and flexed trunk   LOWER EXTREMITY ROM:   limited with all motions due to weakness   LOWER EXTREMITY MMT:    MMT Right Eval Left Eval  Hip flexion 2- 2-  Hip extension    Hip abduction 2+ 2+  Hip adduction 2+ 2+  Hip internal rotation    Hip external rotation    Knee flexion    Knee extension 2- 2+  Ankle dorsiflexion 3+ 3+  Ankle plantarflexion    Ankle inversion    Ankle eversion    (Blank rows = not tested)  TRANSFERS: Assistive device utilized: Environmental consultant - 2 wheeled  Sit to stand: Max A  GAIT: Level of assistance: Max A as wife reports Comments: unable to assess. Wife reports he takes a few steps in the room with walker  FUNCTIONAL TESTS:  5 times sit to stand: unable to do   01/08/23 26.41sec 12/13/22 TUG = 36 seconds , 01/01/23 TUG = 26 seconds   TODAY'S TREATMENT:                                                                                                                              DATE:  02/12/23 Gait outside out back door, down curb and then around to the front of the building, multiple standing rests due to back pain and fatigue Nustep level 5 x 10 mintues LEg curls 45# 2x10 Leg extension 15# 2x10 Seated row 35# 2x10 Lats 35# 2x10 Leg press 70# 3x10  02/07/23 Nustep level 5 x 7 minutes S2S from elevated surface 2x10  Side step length of mat table x2 Standing ball Toss  Gait 3 laps w/ RW ~320 feet one standing  rest Leg curls 45# 2x12 Leg extension 10# 2x12, SL 5lb 2x12 each  MHP Lumbar spine x10 min  02/05/23 Gait out the back door down a curb then around to the front of the building, we had to take 1 standing rest breaks and one seated rest mostly due to back tightness and L hip  pain Nustep level 5 x 7 minutes Leg curls 45# 2x12 Leg extension 10# 2x12, SL 5lb 2x10 each  Standing shoulder Ext green 2x15   01/31/23 Nustep level 5 x 7 minutes Gait 3 laps ~ 320 feet indoors w/ RW some low back tightness after 235ft Volleyball in standing Leg curls 45# 3x10 Leg extension 10# 3x10, SL 5lb 2x10 each  Leg press 60lb 3x10  Standing shoulder Ext green 2x15    01/24/23 Gait out the back door down a curb then around to the front of the building, we had to take about 8 standing rest breaks mostly due to back tightness Nustep level 5 x 7 minutes 15# straight arm pulls 2x10 Leg curls 45# 3x10 Leg extension 10# 3x10 Volleyball in standing Standing 6" toe touches alternating Seated rows 25# 2x10 Lats 25# 2x10  01/22/23 Leg curls 35# 3x10 10# Leg extension 3x10 15#  straight arm pulls Gait 220' with walker and SBA no rest With walker 2.5 # marches 2.5# hip abduction Leg press 60# 3x10, small ROM Calf press 20# 3x10 Nustep level 6 x 6 minutes Using 2 SPC's standing 4" toe taps Walking 25' with 2 SPC's  01/15/23 NuStep L5 x 7 min Alt 6in box taps w/ RW 10# row and straight arm pulls in standing HS curls 55lb x10, 45lb 2x10  10# leg extension 3x10 Fitter press all bands x10 each   PATIENT EDUCATION: Education details: POC and HEP  Person educated: Patient and Spouse Education method: Explanation Education comprehension: verbalized understanding  HOME EXERCISE PROGRAM: Access Code: [email or web address] URL: [email or web address] Date: 11/13/2022 Prepared by: Cassie Freer  Exercises - Seated March  - 1 x daily - 7 x weekly - 2 sets - 10 reps - Seated Knee Extension with Anchored Resistance  - 1 x daily - 7 x weekly - 2 sets - 10 reps - Seated Hip Abduction with Resistance  - 1 x daily - 7 x weekly - 2 sets - 10 reps - Seated Hip Adduction Isometrics with Ball  - 1 x daily - 7 x weekly - 2 sets - 10 reps - Seated Shoulder Horizontal Abduction with  Resistance  - 1 x daily - 7 x weekly - 2 sets - 10 reps  GOALS: Goals reviewed with patient? No  SHORT TERM GOALS: Target date: 12/25/22  Patient will be independent with initial HEP. Goal status: met 01/03/23  2.  Patient will demonstrate improved sitting posture in wheelchair Baseline: increased kyphosis and flexed trunk Goal status: progressing can correct with cues 02/05/23  3.  Patient will be modA with transfers Baseline: maxA Goal status: met 12/13/22   LONG TERM GOALS: Target date: 02/05/23  Patient will be independent with advanced/ongoing HEP to improve outcomes and carryover.  Goal status: INITIAL  2.  Patient will demonstrate improved functional LE strength as demonstrated by 4 or better. Baseline: 2/5  Goal status: Progressing 01/22/23  3.  Patient will be independent with sit to stand from wheelchair Baseline: maxA Goal status: Met 02/05/23  4.  Patient will be able to take at least 5 steps with rolling walker minA Baseline: per wife can take a few steps with modA Goal status: met 01/01/23   ASSESSMENT:  CLINICAL IMPRESSION: We continue to push patient in his ability to be functional, he is going further with the walks than when we started.  He still needs multiple breaks to make it to the front door outside from the back door due to back pain and fatigue.  During the walk outside if he hits a rock with the walker this will really throw him off, needing the CGA to help assure his balance.  Getting off the leg press today he was tired and put one foot on top of the other and did not bother to move the foot and this caused again the CGA to help get off the machine  OBJECTIVE IMPAIRMENTS: decreased activity tolerance, decreased coordination, decreased endurance, decreased knowledge of condition, decreased mobility, difficulty walking, decreased strength, improper body mechanics, postural dysfunction, and obesity.   ACTIVITY LIMITATIONS: sitting, standing, squatting,  stairs, transfers, bed mobility, bathing, toileting, dressing, hygiene/grooming, and locomotion level  PERSONAL FACTORS: Age, Behavior pattern, Fitness, Past/current experiences, Time since onset of injury/illness/exacerbation, and 3+ comorbidities: hx of stroke, heart and kidney transplant, hx of falls, dementia, DM, obesity, ESRD, Afib  are also affecting patient's functional outcome.   REHAB  POTENTIAL: Fair    CLINICAL DECISION MAKING: Evolving/moderate complexity  EVALUATION COMPLEXITY: Moderate   PLAN:  PT FREQUENCY: 2x/week  PT DURATION: 12 weeks  PLANNED INTERVENTIONS: Therapeutic exercises, Therapeutic activity, Neuromuscular re-education, Balance training, Gait training, Patient/Family education, Self Care, Joint mobilization, Stair training, Wheelchair mobility training, Cryotherapy, Moist heat, and Manual therapy  PLAN FOR NEXT SESSION: continue to push the function, may want to look at him stepping over an object as he reports difficulty in and out of the shower and seemed to be talking about having to step over a tub Milan Perkins W, PT 02/12/2023, 9:56 AM

## 2023-02-13 DIAGNOSIS — E1129 Type 2 diabetes mellitus with other diabetic kidney complication: Secondary | ICD-10-CM | POA: Diagnosis not present

## 2023-02-13 DIAGNOSIS — Z992 Dependence on renal dialysis: Secondary | ICD-10-CM | POA: Diagnosis not present

## 2023-02-13 DIAGNOSIS — D631 Anemia in chronic kidney disease: Secondary | ICD-10-CM | POA: Diagnosis not present

## 2023-02-13 DIAGNOSIS — D509 Iron deficiency anemia, unspecified: Secondary | ICD-10-CM | POA: Diagnosis not present

## 2023-02-13 DIAGNOSIS — N2581 Secondary hyperparathyroidism of renal origin: Secondary | ICD-10-CM | POA: Diagnosis not present

## 2023-02-13 DIAGNOSIS — N186 End stage renal disease: Secondary | ICD-10-CM | POA: Diagnosis not present

## 2023-02-14 ENCOUNTER — Ambulatory Visit: Payer: Medicare Other | Admitting: Physical Therapy

## 2023-02-14 ENCOUNTER — Encounter: Payer: Self-pay | Admitting: Physical Therapy

## 2023-02-14 DIAGNOSIS — R293 Abnormal posture: Secondary | ICD-10-CM

## 2023-02-14 DIAGNOSIS — R2681 Unsteadiness on feet: Secondary | ICD-10-CM

## 2023-02-14 DIAGNOSIS — Z9181 History of falling: Secondary | ICD-10-CM | POA: Diagnosis not present

## 2023-02-14 DIAGNOSIS — R2689 Other abnormalities of gait and mobility: Secondary | ICD-10-CM | POA: Diagnosis not present

## 2023-02-14 DIAGNOSIS — R5381 Other malaise: Secondary | ICD-10-CM

## 2023-02-14 DIAGNOSIS — M6281 Muscle weakness (generalized): Secondary | ICD-10-CM

## 2023-02-14 NOTE — Therapy (Signed)
OUTPATIENT PHYSICAL THERAPY NEURO TREATMENT    Patient Name: [name] MRN: [id number] DOB:1961-03-04, 62 y.o., male Today's Date: 02/14/2023   PCP: Merri Brunette REFERRING PROVIDER: Jetta Lout  END OF SESSION:  PT End of Session - 02/14/23 0958     Visit Number 18    Date for PT Re-Evaluation 03/07/23    PT Start Time 1000    PT Stop Time 1045    PT Time Calculation (min) 45 min    Activity Tolerance Patient tolerated treatment well             Past Medical History:  Diagnosis Date   Arthritis    Atrial fibrillation (HCC)    CHF (congestive heart failure), NYHA class III (HCC)    s/p heart transplant   Chronic kidney disease    end stage   Chronic systolic dysfunction of left ventricle    CVA (cerebral infarction)    Diabetes mellitus    PMH; Prior to heart transplant   Family history of coronary artery disease    in both parents   GERD (gastroesophageal reflux disease)    Hyperlipidemia    Hypertension    Left bundle branch block    Morbid obesity (HCC)    status post lap band   Myocardial infarction [facility])    prior to heart transplant   Nonischemic cardiomyopathy (HCC)    prior to heart transplant   Obesity (BMI 30-39.9)    Obstructive sleep apnea    no longer needs CPAP after heart transplant per pt   Premature ventricular contractions    Prostate cancer (HCC)    SOB (shortness of breath)    Past Surgical History:  Procedure Laterality Date   CARDIAC PACEMAKER PLACEMENT  09/21/2009   Biventricular implantable cardioverter-defibrillator implantation      COLONOSCOPY W/ BIOPSIES AND POLYPECTOMY     CYSTOSCOPY WITH FULGERATION N/A 04/27/2021   Procedure: CYSTOSCOPY AND CLOT EVACUATION WITH BLADDER BIOPSY/ FUGARATION OF BLADDER/ FULGARATION OF PROSTATE ;  Surgeon: Jerilee Field, MD;  Location: [facility];  Service: Urology;  Laterality: N/A;   FOOT SURGERY     left   HEART TRANSPLANT  2014   LAPAROSCOPIC GASTRIC BANDING  01/27/2007   LEFT  VENTRICULAR ASSIST DEVICE     implanted at [facility]   PACEMAKER REMOVAL     PROSTATE BIOPSY     TOTAL HIP ARTHROPLASTY Right 07/22/2017   Procedure: RIGHT TOTAL HIP ARTHROPLASTY ANTERIOR APPROACH;  Surgeon: Samson Frederic, MD;  Location: [facility];  Service: Orthopedics;  Laterality: Right;  Needs RNFA   TOTAL KNEE ARTHROPLASTY Right 07/22/2017   Patient Active Problem List   Diagnosis Date Noted   AMS (altered mental status) 11/29/2022   Aspiration pneumonia (HCC) 11/29/2022   PNA (pneumonia) 11/29/2022   Peripheral autonomic neuropathy due to DM (HCC) 11/01/2022   Orthostatic hypotension 11/01/2022   Acute encephalopathy 07/04/2022   Chronic kidney disease, stage 5 (HCC) 11/14/2021   Gait abnormality 10/12/2021   Polyneuropathy associated with underlying disease (HCC) 10/12/2021   Neuropathic pain 10/12/2021   Morbid obesity (HCC) 11/19/2019   Sleep disorder 10/22/2019   Sleep disturbance 10/22/2019   Abnormality of gait 06/09/2019   Diabetic polyneuropathy associated with diabetes mellitus due to underlying condition (HCC) 04/14/2019   Peripheral neuropathy 12/11/2018   CVA (cerebral vascular accident) (HCC) 11/09/2018   H/O heart transplant (HCC) 11/09/2018   Malignant neoplasm of prostate (HCC) 05/06/2018   Closed posterior dislocation of hip, right, initial encounter (HCC)  08/24/2017   End stage renal disease on dialysis (HCC) 08/24/2017   Hip dislocation, right (HCC) 08/24/2017   Osteoarthritis of right hip 07/22/2017   VENTRICULAR TACHYCARDIA 12/21/2009   Dyslipidemia 08/30/2009   Essential hypertension 08/30/2009   ISCHEMIC CARDIOMYOPATHY 08/30/2009   Atrial fibrillation (HCC) 08/30/2009   VENTRICULAR ECTOPY 08/30/2009   History of CVA (cerebrovascular accident) 08/30/2009   Obesity, Class III, BMI 40-49.9 (morbid obesity) (HCC) 08/30/2009   DM type 2 with diabetic peripheral neuropathy (HCC) 10/17/2007   OBSTRUCTIVE SLEEP APNEA 10/17/2007   CARDIOMYOPATHY, DILATED  10/17/2007    ONSET DATE: 10/31/22  REFERRING DIAG:  D84.9 (ICD-10-CM) - Immunodeficiency, unspecified Z94.1 (ICD-10-CM) - Heart transplant status Z48.298 (ICD-10-CM) - Encounter for aftercare following other organ transplant Z79.899 (ICD-10-CM) - Other long term (current) drug therapy G20.C (ICD-10-CM) - Parkinsonism, unspecified  THERAPY DIAG:  Physical deconditioning  Unsteadiness on feet  Muscle weakness (generalized)  Abnormal posture  Rationale for Evaluation and Treatment: Rehabilitation  SUBJECTIVE:                                                                                                                                                                                             SUBJECTIVE STATEMENT: Back is hurting him real bad today.   Pt accompanied by: significant other  PERTINENT HISTORY: heart transplant 7 years ago  On dialysis MWF, heart transplant 6 yrs ago, ESRD, prostate CA, OSA, obesity, CHF, GERD, had DM, CVA with residual left hand weakness, peripheral neuropathy, R THA, AFib   PAIN:  Are you having pain? 8/10 Lower back     PRECAUTIONS: None  WEIGHT BEARING RESTRICTIONS: No  FALLS: Has patient fallen in last 6 months? Yes. Number of falls more than 10  LIVING ENVIRONMENT: Lives with: lives with their family Lives in: House/apartment Stairs: No Has following equipment at home: Environmental consultant - 2 wheeled, Wheelchair (manual), shower chair, and Grab bars  PLOF: Independent with household mobility with device and Independent with community mobility with device  PATIENT GOALS: want to be able to get him to stand and be more mobile and build his strength  OBJECTIVE:   COGNITION: Overall cognitive status: Impaired and History of cognitive impairments - at baseline   SENSATION: WFL   POSTURE: rounded shoulders, forward head, increased thoracic kyphosis, and flexed trunk   LOWER EXTREMITY ROM:   limited with all motions due to  weakness   LOWER EXTREMITY MMT:    MMT Right Eval Left Eval  Hip flexion 2- 2-  Hip extension    Hip abduction 2+ 2+  Hip adduction 2+ 2+  Hip internal rotation  Hip external rotation    Knee flexion    Knee extension 2- 2+  Ankle dorsiflexion 3+ 3+  Ankle plantarflexion    Ankle inversion    Ankle eversion    (Blank rows = not tested)  TRANSFERS: Assistive device utilized: Environmental consultant - 2 wheeled  Sit to stand: Max A  GAIT: Level of assistance: Max A as wife reports Comments: unable to assess. Wife reports he takes a few steps in the room with walker  FUNCTIONAL TESTS:  5 times sit to stand: unable to do   01/08/23 26.41sec 12/13/22 TUG = 36 seconds , 01/01/23 TUG = 26 seconds   TODAY'S TREATMENT:                                                                                                                              DATE:  02/14/23 Nustep level 5 x 10 mintues HS curls 45lb 2x15 Leg extension 15# 2x15 Seated row 35# 3x12 Lats 35# 2x10 Leg press 60# 3x10 Standing side step length of mat table   02/12/23 Gait outside out back door, down curb and then around to the front of the building, multiple standing rests due to back pain and fatigue Nustep level 5 x 10 mintues LEg curls 45# 2x10 Leg extension 15# 2x10 Seated row 35# 2x10 Lats 35# 2x10 Leg press 70# 3x10  02/07/23 Nustep level 5 x 7 minutes S2S from elevated surface 2x10  Side step length of mat table x2 Standing ball Toss  Gait 3 laps w/ RW ~320 feet one standing  rest Leg curls 45# 2x12 Leg extension 10# 2x12, SL 5lb 2x12 each  MHP Lumbar spine x10 min  02/05/23 Gait out the back door down a curb then around to the front of the building, we had to take 1 standing rest breaks and one seated rest mostly due to back tightness and L hip pain Nustep level 5 x 7 minutes Leg curls 45# 2x12 Leg extension 10# 2x12, SL 5lb 2x10 each  Standing shoulder Ext green 2x15   01/31/23 Nustep level 5 x 7  minutes Gait 3 laps ~ 320 feet indoors w/ RW some low back tightness after 232ft Volleyball in standing Leg curls 45# 3x10 Leg extension 10# 3x10, SL 5lb 2x10 each  Leg press 60lb 3x10  Standing shoulder Ext green 2x15    01/24/23 Gait out the back door down a curb then around to the front of the building, we had to take about 8 standing rest breaks mostly due to back tightness Nustep level 5 x 7 minutes 15# straight arm pulls 2x10 Leg curls 45# 3x10 Leg extension 10# 3x10 Volleyball in standing Standing 6" toe touches alternating Seated rows 25# 2x10 Lats 25# 2x10  01/22/23 Leg curls 35# 3x10 10# Leg extension 3x10 15# straight arm pulls Gait 220' with walker and SBA no rest With walker 2.5 # marches 2.5# hip abduction Leg press 60# 3x10, small ROM Calf press  20# 3x10 Nustep level 6 x 6 minutes Using 2 SPC's standing 4" toe taps Walking 25' with 2 SPC's  01/15/23 NuStep L5 x 7 min Alt 6in box taps w/ RW 10# row and straight arm pulls in standing HS curls 55lb x10, 45lb 2x10  10# leg extension 3x10 Fitter press all bands x10 each   PATIENT EDUCATION: Education details: POC and HEP  Person educated: Patient and Spouse Education method: Explanation Education comprehension: verbalized understanding  HOME EXERCISE PROGRAM: Access Code: [email or web address] URL: [email or web address] Date: 11/13/2022 Prepared by: Cassie Freer  Exercises - Seated March  - 1 x daily - 7 x weekly - 2 sets - 10 reps - Seated Knee Extension with Anchored Resistance  - 1 x daily - 7 x weekly - 2 sets - 10 reps - Seated Hip Abduction with Resistance  - 1 x daily - 7 x weekly - 2 sets - 10 reps - Seated Hip Adduction Isometrics with Ball  - 1 x daily - 7 x weekly - 2 sets - 10 reps - Seated Shoulder Horizontal Abduction with Resistance  - 1 x daily - 7 x weekly - 2 sets - 10 reps  GOALS: Goals reviewed with patient? No  SHORT TERM GOALS: Target date: 12/25/22  Patient will be  independent with initial HEP. Goal status: met 01/03/23  2.  Patient will demonstrate improved sitting posture in wheelchair Baseline: increased kyphosis and flexed trunk Goal status: progressing can correct with cues 02/05/23  3.  Patient will be modA with transfers Baseline: maxA Goal status: met 12/13/22   LONG TERM GOALS: Target date: 02/05/23  Patient will be independent with advanced/ongoing HEP to improve outcomes and carryover.  Goal status: INITIAL  2.  Patient will demonstrate improved functional LE strength as demonstrated by 4 or better. Baseline: 2/5  Goal status: Progressing 01/22/23  3.  Patient will be independent with sit to stand from wheelchair Baseline: maxA Goal status: Met 02/05/23  4.  Patient will be able to take at least 5 steps with rolling walker minA Baseline: per wife can take a few steps with modA Goal status: met 01/01/23   ASSESSMENT:  CLINICAL IMPRESSION: We continue to push patient in his ability to be functional, Session adjusted dur to subjective reports of increase back pain. All interventions completed well without reports of increase symptome. Cue for pacing at complete interventions with full ROM. L hip weakness present with side steps   OBJECTIVE IMPAIRMENTS: decreased activity tolerance, decreased coordination, decreased endurance, decreased knowledge of condition, decreased mobility, difficulty walking, decreased strength, improper body mechanics, postural dysfunction, and obesity.   ACTIVITY LIMITATIONS: sitting, standing, squatting, stairs, transfers, bed mobility, bathing, toileting, dressing, hygiene/grooming, and locomotion level  PERSONAL FACTORS: Age, Behavior pattern, Fitness, Past/current experiences, Time since onset of injury/illness/exacerbation, and 3+ comorbidities: hx of stroke, heart and kidney transplant, hx of falls, dementia, DM, obesity, ESRD, Afib  are also affecting patient's functional outcome.   REHAB POTENTIAL: Fair     CLINICAL DECISION MAKING: Evolving/moderate complexity  EVALUATION COMPLEXITY: Moderate   PLAN:  PT FREQUENCY: 2x/week  PT DURATION: 12 weeks  PLANNED INTERVENTIONS: Therapeutic exercises, Therapeutic activity, Neuromuscular re-education, Balance training, Gait training, Patient/Family education, Self Care, Joint mobilization, Stair training, Wheelchair mobility training, Cryotherapy, Moist heat, and Manual therapy  PLAN FOR NEXT SESSION: continue to push the function, may want to look at him stepping over an object as he reports difficulty in and out of the shower and seemed to be talking about  having to step over a tub Grayce Sessions, PTA 02/14/2023, 9:59 AM

## 2023-02-15 DIAGNOSIS — Z992 Dependence on renal dialysis: Secondary | ICD-10-CM | POA: Diagnosis not present

## 2023-02-15 DIAGNOSIS — E1129 Type 2 diabetes mellitus with other diabetic kidney complication: Secondary | ICD-10-CM | POA: Diagnosis not present

## 2023-02-15 DIAGNOSIS — N2581 Secondary hyperparathyroidism of renal origin: Secondary | ICD-10-CM | POA: Diagnosis not present

## 2023-02-15 DIAGNOSIS — D631 Anemia in chronic kidney disease: Secondary | ICD-10-CM | POA: Diagnosis not present

## 2023-02-15 DIAGNOSIS — D509 Iron deficiency anemia, unspecified: Secondary | ICD-10-CM | POA: Diagnosis not present

## 2023-02-15 DIAGNOSIS — N186 End stage renal disease: Secondary | ICD-10-CM | POA: Diagnosis not present

## 2023-02-18 DIAGNOSIS — E1129 Type 2 diabetes mellitus with other diabetic kidney complication: Secondary | ICD-10-CM | POA: Diagnosis not present

## 2023-02-18 DIAGNOSIS — D631 Anemia in chronic kidney disease: Secondary | ICD-10-CM | POA: Diagnosis not present

## 2023-02-18 DIAGNOSIS — D509 Iron deficiency anemia, unspecified: Secondary | ICD-10-CM | POA: Diagnosis not present

## 2023-02-18 DIAGNOSIS — Z992 Dependence on renal dialysis: Secondary | ICD-10-CM | POA: Diagnosis not present

## 2023-02-18 DIAGNOSIS — N2581 Secondary hyperparathyroidism of renal origin: Secondary | ICD-10-CM | POA: Diagnosis not present

## 2023-02-18 DIAGNOSIS — N186 End stage renal disease: Secondary | ICD-10-CM | POA: Diagnosis not present

## 2023-02-19 ENCOUNTER — Encounter: Payer: Self-pay | Admitting: Physical Therapy

## 2023-02-19 ENCOUNTER — Ambulatory Visit: Payer: Medicare Other | Admitting: Physical Therapy

## 2023-02-19 DIAGNOSIS — R2689 Other abnormalities of gait and mobility: Secondary | ICD-10-CM | POA: Diagnosis not present

## 2023-02-19 DIAGNOSIS — M5030 Other cervical disc degeneration, unspecified cervical region: Secondary | ICD-10-CM | POA: Diagnosis not present

## 2023-02-19 DIAGNOSIS — M549 Dorsalgia, unspecified: Secondary | ICD-10-CM | POA: Diagnosis not present

## 2023-02-19 DIAGNOSIS — R293 Abnormal posture: Secondary | ICD-10-CM | POA: Diagnosis not present

## 2023-02-19 DIAGNOSIS — R5381 Other malaise: Secondary | ICD-10-CM | POA: Diagnosis not present

## 2023-02-19 DIAGNOSIS — M4802 Spinal stenosis, cervical region: Secondary | ICD-10-CM | POA: Diagnosis not present

## 2023-02-19 DIAGNOSIS — G629 Polyneuropathy, unspecified: Secondary | ICD-10-CM | POA: Diagnosis not present

## 2023-02-19 DIAGNOSIS — Z79899 Other long term (current) drug therapy: Secondary | ICD-10-CM | POA: Diagnosis not present

## 2023-02-19 DIAGNOSIS — Z992 Dependence on renal dialysis: Secondary | ICD-10-CM | POA: Diagnosis not present

## 2023-02-19 DIAGNOSIS — Z9181 History of falling: Secondary | ICD-10-CM

## 2023-02-19 DIAGNOSIS — F339 Major depressive disorder, recurrent, unspecified: Secondary | ICD-10-CM | POA: Diagnosis not present

## 2023-02-19 DIAGNOSIS — M6281 Muscle weakness (generalized): Secondary | ICD-10-CM | POA: Diagnosis not present

## 2023-02-19 DIAGNOSIS — N186 End stage renal disease: Secondary | ICD-10-CM | POA: Diagnosis not present

## 2023-02-19 DIAGNOSIS — R2681 Unsteadiness on feet: Secondary | ICD-10-CM | POA: Diagnosis not present

## 2023-02-19 DIAGNOSIS — Z6832 Body mass index (BMI) 32.0-32.9, adult: Secondary | ICD-10-CM | POA: Diagnosis not present

## 2023-02-19 DIAGNOSIS — G8929 Other chronic pain: Secondary | ICD-10-CM | POA: Diagnosis not present

## 2023-02-19 DIAGNOSIS — D508 Other iron deficiency anemias: Secondary | ICD-10-CM | POA: Diagnosis not present

## 2023-02-19 DIAGNOSIS — E538 Deficiency of other specified B group vitamins: Secondary | ICD-10-CM | POA: Diagnosis not present

## 2023-02-19 NOTE — Therapy (Signed)
OUTPATIENT PHYSICAL THERAPY NEURO TREATMENT    Patient Name: [name] MRN: [id number] DOB:Aug 04, 1961, 62 y.o., male Today's Date: 02/19/2023   PCP: Merri Brunette REFERRING PROVIDER: Jetta Lout  END OF SESSION:  PT End of Session - 02/19/23 1008     Visit Number 19    Date for PT Re-Evaluation 03/07/23    Authorization Type Medicare    PT Start Time 1008    PT Stop Time 1100    PT Time Calculation (min) 52 min    Equipment Utilized During Treatment Gait belt    Activity Tolerance Patient tolerated treatment well    Behavior During Therapy WFL for tasks assessed/performed             Past Medical History:  Diagnosis Date   Arthritis    Atrial fibrillation (HCC)    CHF (congestive heart failure), NYHA class III (HCC)    s/p heart transplant   Chronic kidney disease    end stage   Chronic systolic dysfunction of left ventricle    CVA (cerebral infarction)    Diabetes mellitus    PMH; Prior to heart transplant   Family history of coronary artery disease    in both parents   GERD (gastroesophageal reflux disease)    Hyperlipidemia    Hypertension    Left bundle branch block    Morbid obesity (HCC)    status post lap band   Myocardial infarction [facility])    prior to heart transplant   Nonischemic cardiomyopathy (HCC)    prior to heart transplant   Obesity (BMI 30-39.9)    Obstructive sleep apnea    no longer needs CPAP after heart transplant per pt   Premature ventricular contractions    Prostate cancer (HCC)    SOB (shortness of breath)    Past Surgical History:  Procedure Laterality Date   CARDIAC PACEMAKER PLACEMENT  09/21/2009   Biventricular implantable cardioverter-defibrillator implantation      COLONOSCOPY W/ BIOPSIES AND POLYPECTOMY     CYSTOSCOPY WITH FULGERATION N/A 04/27/2021   Procedure: CYSTOSCOPY AND CLOT EVACUATION WITH BLADDER BIOPSY/ FUGARATION OF BLADDER/ FULGARATION OF PROSTATE ;  Surgeon: Jerilee Field, MD;  Location: [facility];   Service: Urology;  Laterality: N/A;   FOOT SURGERY     left   HEART TRANSPLANT  2014   LAPAROSCOPIC GASTRIC BANDING  01/27/2007   LEFT VENTRICULAR ASSIST DEVICE     implanted at [facility]   PACEMAKER REMOVAL     PROSTATE BIOPSY     TOTAL HIP ARTHROPLASTY Right 07/22/2017   Procedure: RIGHT TOTAL HIP ARTHROPLASTY ANTERIOR APPROACH;  Surgeon: Samson Frederic, MD;  Location: [facility];  Service: Orthopedics;  Laterality: Right;  Needs RNFA   TOTAL KNEE ARTHROPLASTY Right 07/22/2017   Patient Active Problem List   Diagnosis Date Noted   AMS (altered mental status) 11/29/2022   Aspiration pneumonia (HCC) 11/29/2022   PNA (pneumonia) 11/29/2022   Peripheral autonomic neuropathy due to DM (HCC) 11/01/2022   Orthostatic hypotension 11/01/2022   Acute encephalopathy 07/04/2022   Chronic kidney disease, stage 5 (HCC) 11/14/2021   Gait abnormality 10/12/2021   Polyneuropathy associated with underlying disease (HCC) 10/12/2021   Neuropathic pain 10/12/2021   Morbid obesity (HCC) 11/19/2019   Sleep disorder 10/22/2019   Sleep disturbance 10/22/2019   Abnormality of gait 06/09/2019   Diabetic polyneuropathy associated with diabetes mellitus due to underlying condition (HCC) 04/14/2019   Peripheral neuropathy 12/11/2018   CVA (cerebral vascular accident) (HCC) 11/09/2018  H/O heart transplant (HCC) 11/09/2018   Malignant neoplasm of prostate (HCC) 05/06/2018   Closed posterior dislocation of hip, right, initial encounter (HCC) 08/24/2017   End stage renal disease on dialysis (HCC) 08/24/2017   Hip dislocation, right (HCC) 08/24/2017   Osteoarthritis of right hip 07/22/2017   VENTRICULAR TACHYCARDIA 12/21/2009   Dyslipidemia 08/30/2009   Essential hypertension 08/30/2009   ISCHEMIC CARDIOMYOPATHY 08/30/2009   Atrial fibrillation (HCC) 08/30/2009   VENTRICULAR ECTOPY 08/30/2009   History of CVA (cerebrovascular accident) 08/30/2009   Obesity, Class III, BMI 40-49.9 (morbid obesity) (HCC)  08/30/2009   DM type 2 with diabetic peripheral neuropathy (HCC) 10/17/2007   OBSTRUCTIVE SLEEP APNEA 10/17/2007   CARDIOMYOPATHY, DILATED 10/17/2007    ONSET DATE: 10/31/22  REFERRING DIAG:  D84.9 (ICD-10-CM) - Immunodeficiency, unspecified Z94.1 (ICD-10-CM) - Heart transplant status Z48.298 (ICD-10-CM) - Encounter for aftercare following other organ transplant Z79.899 (ICD-10-CM) - Other long term (current) drug therapy G20.C (ICD-10-CM) - Parkinsonism, unspecified  THERAPY DIAG:  Physical deconditioning  Unsteadiness on feet  Muscle weakness (generalized)  Abnormal posture  History of falling  Rationale for Evaluation and Treatment: Rehabilitation  SUBJECTIVE:                                                                                                                                                                                             SUBJECTIVE STATEMENT: Doing okay, no problems, still sore in the back and the right hip  Pt accompanied by: significant other  PERTINENT HISTORY: heart transplant 7 years ago  On dialysis MWF, heart transplant 6 yrs ago, ESRD, prostate CA, OSA, obesity, CHF, GERD, had DM, CVA with residual left hand weakness, peripheral neuropathy, R THA, AFib   PAIN:  Are you having pain? 8/10 Lower back     PRECAUTIONS: None  WEIGHT BEARING RESTRICTIONS: No  FALLS: Has patient fallen in last 6 months? Yes. Number of falls more than 10  LIVING ENVIRONMENT: Lives with: lives with their family Lives in: House/apartment Stairs: No Has following equipment at home: Environmental consultant - 2 wheeled, Wheelchair (manual), shower chair, and Grab bars  PLOF: Independent with household mobility with device and Independent with community mobility with device  PATIENT GOALS: want to be able to get him to stand and be more mobile and build his strength  OBJECTIVE:   COGNITION: Overall cognitive status: Impaired and History of cognitive impairments - at  baseline   SENSATION: WFL   POSTURE: rounded shoulders, forward head, increased thoracic kyphosis, and flexed trunk   LOWER EXTREMITY ROM:   limited with all motions due to weakness   LOWER EXTREMITY MMT:  MMT Right Eval Left Eval  Hip flexion 2- 2-  Hip extension    Hip abduction 2+ 2+  Hip adduction 2+ 2+  Hip internal rotation    Hip external rotation    Knee flexion    Knee extension 2- 2+  Ankle dorsiflexion 3+ 3+  Ankle plantarflexion    Ankle inversion    Ankle eversion    (Blank rows = not tested)  TRANSFERS: Assistive device utilized: Environmental consultant - 2 wheeled  Sit to stand: Max A  GAIT: Level of assistance: Max A as wife reports Comments: unable to assess. Wife reports he takes a few steps in the room with walker  FUNCTIONAL TESTS:  5 times sit to stand: unable to do   01/08/23 26.41sec 12/13/22 TUG = 36 seconds , 01/01/23 TUG = 26 seconds   TODAY'S TREATMENT:                                                                                                                              DATE:  02/19/23 Nustep level 5 x 7 minutes Practiced stepping over tub to get in the shower, due to some fear and pain in the right hip he could not do this. Worked on problem solving this with him HS curls 45# 2x15 15# leg extension Leg press 60# x2 had some knee pain, 40# 2x15, then single legs 20# 10# straight arm pulls standing Gait with FWW 110 feet  02/14/23 Nustep level 5 x 10 mintues HS curls 45lb 2x15 Leg extension 15# 2x15 Seated row 35# 3x12 Lats 35# 2x10 Leg press 60# 3x10 Standing side step length of mat table   02/12/23 Gait outside out back door, down curb and then around to the front of the building, multiple standing rests due to back pain and fatigue Nustep level 5 x 10 mintues LEg curls 45# 2x10 Leg extension 15# 2x10 Seated row 35# 2x10 Lats 35# 2x10 Leg press 70# 3x10  02/07/23 Nustep level 5 x 7 minutes S2S from elevated surface 2x10  Side step  length of mat table x2 Standing ball Toss  Gait 3 laps w/ RW ~320 feet one standing  rest Leg curls 45# 2x12 Leg extension 10# 2x12, SL 5lb 2x12 each  MHP Lumbar spine x10 min  02/05/23 Gait out the back door down a curb then around to the front of the building, we had to take 1 standing rest breaks and one seated rest mostly due to back tightness and L hip pain Nustep level 5 x 7 minutes Leg curls 45# 2x12 Leg extension 10# 2x12, SL 5lb 2x10 each  Standing shoulder Ext green 2x15   01/31/23 Nustep level 5 x 7 minutes Gait 3 laps ~ 320 feet indoors w/ RW some low back tightness after 265ft Volleyball in standing Leg curls 45# 3x10 Leg extension 10# 3x10, SL 5lb 2x10 each  Leg press 60lb 3x10  Standing shoulder Ext green 2x15    01/24/23  Gait out the back door down a curb then around to the front of the building, we had to take about 8 standing rest breaks mostly due to back tightness Nustep level 5 x 7 minutes 15# straight arm pulls 2x10 Leg curls 45# 3x10 Leg extension 10# 3x10 Volleyball in standing Standing 6" toe touches alternating Seated rows 25# 2x10 Lats 25# 2x10  01/22/23 Leg curls 35# 3x10 10# Leg extension 3x10 15# straight arm pulls Gait 220' with walker and SBA no rest With walker 2.5 # marches 2.5# hip abduction Leg press 60# 3x10, small ROM Calf press 20# 3x10 Nustep level 6 x 6 minutes Using 2 SPC's standing 4" toe taps Walking 25' with 2 SPC's  01/15/23 NuStep L5 x 7 min Alt 6in box taps w/ RW 10# row and straight arm pulls in standing HS curls 55lb x10, 45lb 2x10  10# leg extension 3x10 Fitter press all bands x10 each   PATIENT EDUCATION: Education details: POC and HEP  Person educated: Patient and Spouse Education method: Explanation Education comprehension: verbalized understanding  HOME EXERCISE PROGRAM: Access Code: [email or web address] URL: [email or web address] Date: 11/13/2022 Prepared by: Cassie Freer  Exercises - Seated March   - 1 x daily - 7 x weekly - 2 sets - 10 reps - Seated Knee Extension with Anchored Resistance  - 1 x daily - 7 x weekly - 2 sets - 10 reps - Seated Hip Abduction with Resistance  - 1 x daily - 7 x weekly - 2 sets - 10 reps - Seated Hip Adduction Isometrics with Ball  - 1 x daily - 7 x weekly - 2 sets - 10 reps - Seated Shoulder Horizontal Abduction with Resistance  - 1 x daily - 7 x weekly - 2 sets - 10 reps  GOALS: Goals reviewed with patient? No  SHORT TERM GOALS: Target date: 12/25/22  Patient will be independent with initial HEP. Goal status: met 01/03/23  2.  Patient will demonstrate improved sitting posture in wheelchair Baseline: increased kyphosis and flexed trunk Goal status: progressing can correct with cues 02/05/23  3.  Patient will be modA with transfers Baseline: maxA Goal status: met 12/13/22   LONG TERM GOALS: Target date: 02/05/23  Patient will be independent with advanced/ongoing HEP to improve outcomes and carryover.  Goal status: INITIAL  2.  Patient will demonstrate improved functional LE strength as demonstrated by 4 or better. Baseline: 2/5  Goal status: Progressing 01/22/23  3.  Patient will be independent with sit to stand from wheelchair Baseline: maxA Goal status: Met 02/05/23  4.  Patient will be able to take at least 5 steps with rolling walker minA Baseline: per wife can take a few steps with modA Goal status: met 01/01/23   ASSESSMENT:  CLINICAL IMPRESSION: Patient wanted to try to problem solve the tub transfer, we discussed the need for grab bars and we tried stepping over a 12" step, he could not do this with [place] holding the doorframe, he had difficulty clearing toes on 12" step while holding banister.  OBJECTIVE IMPAIRMENTS: decreased activity tolerance, decreased coordination, decreased endurance, decreased knowledge of condition, decreased mobility, difficulty walking, decreased strength, improper body mechanics, postural dysfunction, and  obesity.   ACTIVITY LIMITATIONS: sitting, standing, squatting, stairs, transfers, bed mobility, bathing, toileting, dressing, hygiene/grooming, and locomotion level  PERSONAL FACTORS: Age, Behavior pattern, Fitness, Past/current experiences, Time since onset of injury/illness/exacerbation, and 3+ comorbidities: hx of stroke, heart and kidney transplant, hx of falls, dementia, DM, obesity, ESRD, Afib  are also affecting patient's functional outcome.   REHAB POTENTIAL: Fair    CLINICAL DECISION MAKING: Evolving/moderate complexity  EVALUATION COMPLEXITY: Moderate   PLAN:  PT FREQUENCY: 2x/week  PT DURATION: 12 weeks  PLANNED INTERVENTIONS: Therapeutic exercises, Therapeutic activity, Neuromuscular re-education, Balance training, Gait training, Patient/Family education, Self Care, Joint mobilization, Stair training, Wheelchair mobility training, Cryotherapy, Moist heat, and Manual therapy  PLAN FOR NEXT SESSION: continue to push the function, may want to look at him stepping over an object as he reports difficulty in and out of the shower and seemed to be talking about having to step over a tub Karishma Unrein W, PT 02/19/2023, 10:08 AM

## 2023-02-20 DIAGNOSIS — D631 Anemia in chronic kidney disease: Secondary | ICD-10-CM | POA: Diagnosis not present

## 2023-02-20 DIAGNOSIS — N186 End stage renal disease: Secondary | ICD-10-CM | POA: Diagnosis not present

## 2023-02-20 DIAGNOSIS — D509 Iron deficiency anemia, unspecified: Secondary | ICD-10-CM | POA: Diagnosis not present

## 2023-02-20 DIAGNOSIS — Z992 Dependence on renal dialysis: Secondary | ICD-10-CM | POA: Diagnosis not present

## 2023-02-20 DIAGNOSIS — N2581 Secondary hyperparathyroidism of renal origin: Secondary | ICD-10-CM | POA: Diagnosis not present

## 2023-02-20 DIAGNOSIS — E1129 Type 2 diabetes mellitus with other diabetic kidney complication: Secondary | ICD-10-CM | POA: Diagnosis not present

## 2023-02-21 ENCOUNTER — Ambulatory Visit: Payer: Medicare Other | Admitting: Physical Therapy

## 2023-02-21 ENCOUNTER — Encounter: Payer: Self-pay | Admitting: Physical Therapy

## 2023-02-21 DIAGNOSIS — M6281 Muscle weakness (generalized): Secondary | ICD-10-CM

## 2023-02-21 DIAGNOSIS — R293 Abnormal posture: Secondary | ICD-10-CM | POA: Diagnosis not present

## 2023-02-21 DIAGNOSIS — R2689 Other abnormalities of gait and mobility: Secondary | ICD-10-CM

## 2023-02-21 DIAGNOSIS — R2681 Unsteadiness on feet: Secondary | ICD-10-CM | POA: Diagnosis not present

## 2023-02-21 DIAGNOSIS — Z9181 History of falling: Secondary | ICD-10-CM

## 2023-02-21 DIAGNOSIS — M545 Low back pain, unspecified: Secondary | ICD-10-CM

## 2023-02-21 DIAGNOSIS — R5381 Other malaise: Secondary | ICD-10-CM | POA: Diagnosis not present

## 2023-02-21 NOTE — Therapy (Signed)
OUTPATIENT PHYSICAL THERAPY NEURO TREATMENT Progress Note Reporting Period 01/15/23 to 02/21/23 for visits 11-20  See note below for Objective Data and Assessment of Progress/Goals.       Patient Name: [name] MRN: [id number] DOB:11/07/1960, 62 y.o., male Today's Date: 02/21/2023   PCP: Merri Brunette REFERRING PROVIDER: Jetta Lout  END OF SESSION:  PT End of Session - 02/21/23 1013     Visit Number 20    Date for PT Re-Evaluation 03/07/23    Authorization Type Medicare    PT Start Time 1013    PT Stop Time 1100    PT Time Calculation (min) 47 min    Equipment Utilized During Treatment Gait belt    Activity Tolerance Patient tolerated treatment well    Behavior During Therapy WFL for tasks assessed/performed             Past Medical History:  Diagnosis Date   Arthritis    Atrial fibrillation (HCC)    CHF (congestive heart failure), NYHA class III (HCC)    s/p heart transplant   Chronic kidney disease    end stage   Chronic systolic dysfunction of left ventricle    CVA (cerebral infarction)    Diabetes mellitus    PMH; Prior to heart transplant   Family history of coronary artery disease    in both parents   GERD (gastroesophageal reflux disease)    Hyperlipidemia    Hypertension    Left bundle branch block    Morbid obesity (HCC)    status post lap band   Myocardial infarction [facility])    prior to heart transplant   Nonischemic cardiomyopathy (HCC)    prior to heart transplant   Obesity (BMI 30-39.9)    Obstructive sleep apnea    no longer needs CPAP after heart transplant per pt   Premature ventricular contractions    Prostate cancer (HCC)    SOB (shortness of breath)    Past Surgical History:  Procedure Laterality Date   CARDIAC PACEMAKER PLACEMENT  09/21/2009   Biventricular implantable cardioverter-defibrillator implantation      COLONOSCOPY W/ BIOPSIES AND POLYPECTOMY     CYSTOSCOPY WITH FULGERATION N/A 04/27/2021   Procedure:  CYSTOSCOPY AND CLOT EVACUATION WITH BLADDER BIOPSY/ FUGARATION OF BLADDER/ FULGARATION OF PROSTATE ;  Surgeon: Jerilee Field, MD;  Location: [facility];  Service: Urology;  Laterality: N/A;   FOOT SURGERY     left   HEART TRANSPLANT  2014   LAPAROSCOPIC GASTRIC BANDING  01/27/2007   LEFT VENTRICULAR ASSIST DEVICE     implanted at [facility]   PACEMAKER REMOVAL     PROSTATE BIOPSY     TOTAL HIP ARTHROPLASTY Right 07/22/2017   Procedure: RIGHT TOTAL HIP ARTHROPLASTY ANTERIOR APPROACH;  Surgeon: Samson Frederic, MD;  Location: [facility];  Service: Orthopedics;  Laterality: Right;  Needs RNFA   TOTAL KNEE ARTHROPLASTY Right 07/22/2017   Patient Active Problem List   Diagnosis Date Noted   AMS (altered mental status) 11/29/2022   Aspiration pneumonia (HCC) 11/29/2022   PNA (pneumonia) 11/29/2022   Peripheral autonomic neuropathy due to DM (HCC) 11/01/2022   Orthostatic hypotension 11/01/2022   Acute encephalopathy 07/04/2022   Chronic kidney disease, stage 5 (HCC) 11/14/2021   Gait abnormality 10/12/2021   Polyneuropathy associated with underlying disease (HCC) 10/12/2021   Neuropathic pain 10/12/2021   Morbid obesity (HCC) 11/19/2019   Sleep disorder 10/22/2019   Sleep disturbance 10/22/2019   Abnormality of gait 06/09/2019   Diabetic polyneuropathy  associated with diabetes mellitus due to underlying condition (HCC) 04/14/2019   Peripheral neuropathy 12/11/2018   CVA (cerebral vascular accident) (HCC) 11/09/2018   H/O heart transplant (HCC) 11/09/2018   Malignant neoplasm of prostate (HCC) 05/06/2018   Closed posterior dislocation of hip, right, initial encounter (HCC) 08/24/2017   End stage renal disease on dialysis (HCC) 08/24/2017   Hip dislocation, right (HCC) 08/24/2017   Osteoarthritis of right hip 07/22/2017   VENTRICULAR TACHYCARDIA 12/21/2009   Dyslipidemia 08/30/2009   Essential hypertension 08/30/2009   ISCHEMIC CARDIOMYOPATHY 08/30/2009   Atrial fibrillation (HCC) 08/30/2009    VENTRICULAR ECTOPY 08/30/2009   History of CVA (cerebrovascular accident) 08/30/2009   Obesity, Class III, BMI 40-49.9 (morbid obesity) (HCC) 08/30/2009   DM type 2 with diabetic peripheral neuropathy (HCC) 10/17/2007   OBSTRUCTIVE SLEEP APNEA 10/17/2007   CARDIOMYOPATHY, DILATED 10/17/2007    ONSET DATE: 10/31/22  REFERRING DIAG:  D84.9 (ICD-10-CM) - Immunodeficiency, unspecified Z94.1 (ICD-10-CM) - Heart transplant status Z48.298 (ICD-10-CM) - Encounter for aftercare following other organ transplant Z79.899 (ICD-10-CM) - Other long term (current) drug therapy G20.C (ICD-10-CM) - Parkinsonism, unspecified  THERAPY DIAG:  Physical deconditioning  Unsteadiness on feet  Muscle weakness (generalized)  Abnormal posture  History of falling  Other abnormalities of gait and mobility  Chronic low back pain, unspecified back pain laterality, unspecified whether sciatica present  Rationale for Evaluation and Treatment: Rehabilitation  SUBJECTIVE:                                                                                                                                                                                             SUBJECTIVE STATEMENT: Still reports that he is sore, no falls  Pt accompanied by: significant other  PERTINENT HISTORY: heart transplant 7 years ago  On dialysis MWF, heart transplant 6 yrs ago, ESRD, prostate CA, OSA, obesity, CHF, GERD, had DM, CVA with residual left hand weakness, peripheral neuropathy, R THA, AFib   PAIN:  Are you having pain? 8/10 Lower back     PRECAUTIONS: None  WEIGHT BEARING RESTRICTIONS: No  FALLS: Has patient fallen in last 6 months? Yes. Number of falls more than 10  LIVING ENVIRONMENT: Lives with: lives with their family Lives in: House/apartment Stairs: No Has following equipment at home: Environmental consultant - 2 wheeled, Wheelchair (manual), shower chair, and Grab bars  PLOF: Independent with household mobility with  device and Independent with community mobility with device  PATIENT GOALS: want to be able to get him to stand and be more mobile and build his strength  OBJECTIVE:   COGNITION: Overall cognitive status: Impaired and History of cognitive impairments -  at baseline   SENSATION: WFL   POSTURE: rounded shoulders, forward head, increased thoracic kyphosis, and flexed trunk   LOWER EXTREMITY ROM:   limited with all motions due to weakness   LOWER EXTREMITY MMT:    MMT Right Eval Left Eval  Hip flexion 2- 2-  Hip extension    Hip abduction 2+ 2+  Hip adduction 2+ 2+  Hip internal rotation    Hip external rotation    Knee flexion    Knee extension 2- 2+  Ankle dorsiflexion 3+ 3+  Ankle plantarflexion    Ankle inversion    Ankle eversion    (Blank rows = not tested)  TRANSFERS: Assistive device utilized: Environmental consultant - 2 wheeled  Sit to stand: Max A  GAIT: Level of assistance: Max A as wife reports Comments: unable to assess. Wife reports he takes a few steps in the room with walker  FUNCTIONAL TESTS:  5 times sit to stand: unable to do   01/08/23 26.41sec 12/13/22 TUG = 36 seconds , 01/01/23 TUG = 26 seconds   TODAY'S TREATMENT:                                                                                                                              DATE:  02/21/23 Leg curls 45# 2x15 Leg extension 15# 3x10, 10# single legs x10 Leg press 60# 2x10, then single legs 20# 2x10 Nustep level 5 x 5 minues level 6 x 5 minutes 15# straight arm pulls Green tband clamshells 35# seated row 2x10 35# lats 2x10 Walking 100 feet with FWW  02/19/23 Nustep level 5 x 7 minutes Practiced stepping over tub to get in the shower, due to some fear and pain in the right hip he could not do this. Worked on problem solving this with him HS curls 45# 2x15 15# leg extension Leg press 60# x2 had some knee pain, 40# 2x15, then single legs 20# 10# straight arm pulls standing Gait with FWW 110  feet  02/14/23 Nustep level 5 x 10 mintues HS curls 45lb 2x15 Leg extension 15# 2x15 Seated row 35# 3x12 Lats 35# 2x10 Leg press 60# 3x10 Standing side step length of mat table   02/12/23 Gait outside out back door, down curb and then around to the front of the building, multiple standing rests due to back pain and fatigue Nustep level 5 x 10 mintues LEg curls 45# 2x10 Leg extension 15# 2x10 Seated row 35# 2x10 Lats 35# 2x10 Leg press 70# 3x10  02/07/23 Nustep level 5 x 7 minutes S2S from elevated surface 2x10  Side step length of mat table x2 Standing ball Toss  Gait 3 laps w/ RW ~320 feet one standing  rest Leg curls 45# 2x12 Leg extension 10# 2x12, SL 5lb 2x12 each  MHP Lumbar spine x10 min  02/05/23 Gait out the back door down a curb then around to the front of the building, we had to take 1  standing rest breaks and one seated rest mostly due to back tightness and L hip pain Nustep level 5 x 7 minutes Leg curls 45# 2x12 Leg extension 10# 2x12, SL 5lb 2x10 each  Standing shoulder Ext green 2x15   01/31/23 Nustep level 5 x 7 minutes Gait 3 laps ~ 320 feet indoors w/ RW some low back tightness after 240ft Volleyball in standing Leg curls 45# 3x10 Leg extension 10# 3x10, SL 5lb 2x10 each  Leg press 60lb 3x10  Standing shoulder Ext green 2x15    01/24/23 Gait out the back door down a curb then around to the front of the building, we had to take about 8 standing rest breaks mostly due to back tightness Nustep level 5 x 7 minutes 15# straight arm pulls 2x10 Leg curls 45# 3x10 Leg extension 10# 3x10 Volleyball in standing Standing 6" toe touches alternating Seated rows 25# 2x10 Lats 25# 2x10  01/22/23 Leg curls 35# 3x10 10# Leg extension 3x10 15# straight arm pulls Gait 220' with walker and SBA no rest With walker 2.5 # marches 2.5# hip abduction Leg press 60# 3x10, small ROM Calf press 20# 3x10 Nustep level 6 x 6 minutes Using 2 SPC's standing 4" toe  taps Walking 25' with 2 SPC's  01/15/23 NuStep L5 x 7 min Alt 6in box taps w/ RW 10# row and straight arm pulls in standing HS curls 55lb x10, 45lb 2x10  10# leg extension 3x10 Fitter press all bands x10 each   PATIENT EDUCATION: Education details: POC and HEP  Person educated: Patient and Spouse Education method: Explanation Education comprehension: verbalized understanding  HOME EXERCISE PROGRAM: Access Code: [email or web address] URL: [email or web address] Date: 11/13/2022 Prepared by: Cassie Freer  Exercises - Seated March  - 1 x daily - 7 x weekly - 2 sets - 10 reps - Seated Knee Extension with Anchored Resistance  - 1 x daily - 7 x weekly - 2 sets - 10 reps - Seated Hip Abduction with Resistance  - 1 x daily - 7 x weekly - 2 sets - 10 reps - Seated Hip Adduction Isometrics with Ball  - 1 x daily - 7 x weekly - 2 sets - 10 reps - Seated Shoulder Horizontal Abduction with Resistance  - 1 x daily - 7 x weekly - 2 sets - 10 reps  GOALS: Goals reviewed with patient? No  SHORT TERM GOALS: Target date: 12/25/22  Patient will be independent with initial HEP. Goal status: met 01/03/23  2.  Patient will demonstrate improved sitting posture in wheelchair Baseline: increased kyphosis and flexed trunk Goal status: progressing can correct with cues 02/05/23  3.  Patient will be modA with transfers Baseline: maxA Goal status: met 12/13/22   LONG TERM GOALS: Target date: 02/05/23  Patient will be independent with advanced/ongoing HEP to improve outcomes and carryover.  Goal status: progressing 02/21/23  2.  Patient will demonstrate improved functional LE strength as demonstrated by 4 or better. Baseline: 2/5  Goal status: Progressing 01/22/23  3.  Patient will be independent with sit to stand from wheelchair Baseline: maxA Goal status: Met 02/05/23  4.  Patient will be able to take at least 5 steps with rolling walker minA Baseline: per wife can take a few steps with  modA Goal status: met 01/01/23   ASSESSMENT:  CLINICAL IMPRESSION: Patient with some left hip pain.  Worked a little on the hip strength.  I did add some resistance tot he nustep and he did well, tried some  single leg activity on the leg extension and the leg press, left leg definitely weaker  OBJECTIVE IMPAIRMENTS: decreased activity tolerance, decreased coordination, decreased endurance, decreased knowledge of condition, decreased mobility, difficulty walking, decreased strength, improper body mechanics, postural dysfunction, and obesity.   ACTIVITY LIMITATIONS: sitting, standing, squatting, stairs, transfers, bed mobility, bathing, toileting, dressing, hygiene/grooming, and locomotion level  PERSONAL FACTORS: Age, Behavior pattern, Fitness, Past/current experiences, Time since onset of injury/illness/exacerbation, and 3+ comorbidities: hx of stroke, heart and kidney transplant, hx of falls, dementia, DM, obesity, ESRD, Afib  are also affecting patient's functional outcome.   REHAB POTENTIAL: Fair    CLINICAL DECISION MAKING: Evolving/moderate complexity  EVALUATION COMPLEXITY: Moderate   PLAN:  PT FREQUENCY: 2x/week  PT DURATION: 12 weeks  PLANNED INTERVENTIONS: Therapeutic exercises, Therapeutic activity, Neuromuscular re-education, Balance training, Gait training, Patient/Family education, Self Care, Joint mobilization, Stair training, Wheelchair mobility training, Cryotherapy, Moist heat, and Manual therapy  PLAN FOR NEXT SESSION: work on strength of the hips [employer], PT 02/21/2023, 10:14 AM

## 2023-02-22 DIAGNOSIS — N2581 Secondary hyperparathyroidism of renal origin: Secondary | ICD-10-CM | POA: Diagnosis not present

## 2023-02-22 DIAGNOSIS — E1129 Type 2 diabetes mellitus with other diabetic kidney complication: Secondary | ICD-10-CM | POA: Diagnosis not present

## 2023-02-22 DIAGNOSIS — D631 Anemia in chronic kidney disease: Secondary | ICD-10-CM | POA: Diagnosis not present

## 2023-02-22 DIAGNOSIS — Z992 Dependence on renal dialysis: Secondary | ICD-10-CM | POA: Diagnosis not present

## 2023-02-22 DIAGNOSIS — D509 Iron deficiency anemia, unspecified: Secondary | ICD-10-CM | POA: Diagnosis not present

## 2023-02-22 DIAGNOSIS — N186 End stage renal disease: Secondary | ICD-10-CM | POA: Diagnosis not present

## 2023-02-25 ENCOUNTER — Ambulatory Visit: Payer: Medicare Other | Attending: Cardiology | Admitting: Physical Therapy

## 2023-02-25 ENCOUNTER — Encounter: Payer: Self-pay | Admitting: Physical Therapy

## 2023-02-25 DIAGNOSIS — R5381 Other malaise: Secondary | ICD-10-CM | POA: Diagnosis not present

## 2023-02-25 DIAGNOSIS — M6281 Muscle weakness (generalized): Secondary | ICD-10-CM | POA: Diagnosis not present

## 2023-02-25 DIAGNOSIS — E1129 Type 2 diabetes mellitus with other diabetic kidney complication: Secondary | ICD-10-CM | POA: Diagnosis not present

## 2023-02-25 DIAGNOSIS — Z9181 History of falling: Secondary | ICD-10-CM | POA: Diagnosis not present

## 2023-02-25 DIAGNOSIS — R293 Abnormal posture: Secondary | ICD-10-CM | POA: Diagnosis not present

## 2023-02-25 DIAGNOSIS — R2681 Unsteadiness on feet: Secondary | ICD-10-CM

## 2023-02-25 DIAGNOSIS — D509 Iron deficiency anemia, unspecified: Secondary | ICD-10-CM | POA: Diagnosis not present

## 2023-02-25 DIAGNOSIS — T8620 Unspecified complication of heart transplant: Secondary | ICD-10-CM | POA: Diagnosis not present

## 2023-02-25 DIAGNOSIS — Z992 Dependence on renal dialysis: Secondary | ICD-10-CM | POA: Diagnosis not present

## 2023-02-25 DIAGNOSIS — D631 Anemia in chronic kidney disease: Secondary | ICD-10-CM | POA: Diagnosis not present

## 2023-02-25 DIAGNOSIS — R2689 Other abnormalities of gait and mobility: Secondary | ICD-10-CM | POA: Diagnosis not present

## 2023-02-25 DIAGNOSIS — G8929 Other chronic pain: Secondary | ICD-10-CM | POA: Insufficient documentation

## 2023-02-25 DIAGNOSIS — N186 End stage renal disease: Secondary | ICD-10-CM | POA: Diagnosis not present

## 2023-02-25 DIAGNOSIS — M545 Low back pain, unspecified: Secondary | ICD-10-CM | POA: Diagnosis not present

## 2023-02-25 DIAGNOSIS — N2581 Secondary hyperparathyroidism of renal origin: Secondary | ICD-10-CM | POA: Diagnosis not present

## 2023-02-25 NOTE — Therapy (Signed)
OUTPATIENT PHYSICAL THERAPY NEURO TREATMENT  Patient Name: [name] MRN: [id number] DOB:1961-01-20, 62 y.o., male Today's Date: 02/25/2023   PCP: Merri Brunette REFERRING PROVIDER: Jetta Lout  END OF SESSION:  PT End of Session - 02/25/23 1512     Visit Number 21    Date for PT Re-Evaluation 03/07/23    PT Start Time 1515    PT Stop Time 1600    PT Time Calculation (min) 45 min    Activity Tolerance Patient tolerated treatment well    Behavior During Therapy WFL for tasks assessed/performed             Past Medical History:  Diagnosis Date   Arthritis    Atrial fibrillation (HCC)    CHF (congestive heart failure), NYHA class III (HCC)    s/p heart transplant   Chronic kidney disease    end stage   Chronic systolic dysfunction of left ventricle    CVA (cerebral infarction)    Diabetes mellitus    PMH; Prior to heart transplant   Family history of coronary artery disease    in both parents   GERD (gastroesophageal reflux disease)    Hyperlipidemia    Hypertension    Left bundle branch block    Morbid obesity (HCC)    status post lap band   Myocardial infarction [facility])    prior to heart transplant   Nonischemic cardiomyopathy (HCC)    prior to heart transplant   Obesity (BMI 30-39.9)    Obstructive sleep apnea    no longer needs CPAP after heart transplant per pt   Premature ventricular contractions    Prostate cancer (HCC)    SOB (shortness of breath)    Past Surgical History:  Procedure Laterality Date   CARDIAC PACEMAKER PLACEMENT  09/21/2009   Biventricular implantable cardioverter-defibrillator implantation      COLONOSCOPY W/ BIOPSIES AND POLYPECTOMY     CYSTOSCOPY WITH FULGERATION N/A 04/27/2021   Procedure: CYSTOSCOPY AND CLOT EVACUATION WITH BLADDER BIOPSY/ FUGARATION OF BLADDER/ FULGARATION OF PROSTATE ;  Surgeon: Jerilee Field, MD;  Location: [facility];  Service: Urology;  Laterality: N/A;   FOOT SURGERY     left   HEART TRANSPLANT   2014   LAPAROSCOPIC GASTRIC BANDING  01/27/2007   LEFT VENTRICULAR ASSIST DEVICE     implanted at [facility]   PACEMAKER REMOVAL     PROSTATE BIOPSY     TOTAL HIP ARTHROPLASTY Right 07/22/2017   Procedure: RIGHT TOTAL HIP ARTHROPLASTY ANTERIOR APPROACH;  Surgeon: Samson Frederic, MD;  Location: [facility];  Service: Orthopedics;  Laterality: Right;  Needs RNFA   TOTAL KNEE ARTHROPLASTY Right 07/22/2017   Patient Active Problem List   Diagnosis Date Noted   AMS (altered mental status) 11/29/2022   Aspiration pneumonia (HCC) 11/29/2022   PNA (pneumonia) 11/29/2022   Peripheral autonomic neuropathy due to DM (HCC) 11/01/2022   Orthostatic hypotension 11/01/2022   Acute encephalopathy 07/04/2022   Chronic kidney disease, stage 5 (HCC) 11/14/2021   Gait abnormality 10/12/2021   Polyneuropathy associated with underlying disease (HCC) 10/12/2021   Neuropathic pain 10/12/2021   Morbid obesity (HCC) 11/19/2019   Sleep disorder 10/22/2019   Sleep disturbance 10/22/2019   Abnormality of gait 06/09/2019   Diabetic polyneuropathy associated with diabetes mellitus due to underlying condition (HCC) 04/14/2019   Peripheral neuropathy 12/11/2018   CVA (cerebral vascular accident) (HCC) 11/09/2018   H/O heart transplant (HCC) 11/09/2018   Malignant neoplasm of prostate (HCC) 05/06/2018   Closed  posterior dislocation of hip, right, initial encounter (HCC) 08/24/2017   End stage renal disease on dialysis (HCC) 08/24/2017   Hip dislocation, right (HCC) 08/24/2017   Osteoarthritis of right hip 07/22/2017   VENTRICULAR TACHYCARDIA 12/21/2009   Dyslipidemia 08/30/2009   Essential hypertension 08/30/2009   ISCHEMIC CARDIOMYOPATHY 08/30/2009   Atrial fibrillation (HCC) 08/30/2009   VENTRICULAR ECTOPY 08/30/2009   History of CVA (cerebrovascular accident) 08/30/2009   Obesity, Class III, BMI 40-49.9 (morbid obesity) (HCC) 08/30/2009   DM type 2 with diabetic peripheral neuropathy (HCC) 10/17/2007   OBSTRUCTIVE  SLEEP APNEA 10/17/2007   CARDIOMYOPATHY, DILATED 10/17/2007    ONSET DATE: 10/31/22  REFERRING DIAG:  D84.9 (ICD-10-CM) - Immunodeficiency, unspecified Z94.1 (ICD-10-CM) - Heart transplant status Z48.298 (ICD-10-CM) - Encounter for aftercare following other organ transplant Z79.899 (ICD-10-CM) - Other long term (current) drug therapy G20.C (ICD-10-CM) - Parkinsonism, unspecified  THERAPY DIAG:  Physical deconditioning  Unsteadiness on feet  Muscle weakness (generalized)  Abnormal posture  Chronic low back pain, unspecified back pain laterality, unspecified whether sciatica present  Rationale for Evaluation and Treatment: Rehabilitation  SUBJECTIVE:                                                                                                                                                                                             SUBJECTIVE STATEMENT: "Feeling all right" Seems like the weather does it  Pt accompanied by: significant other  PERTINENT HISTORY: heart transplant 7 years ago  On dialysis MWF, heart transplant 6 yrs ago, ESRD, prostate CA, OSA, obesity, CHF, GERD, had DM, CVA with residual left hand weakness, peripheral neuropathy, R THA, AFib   PAIN:  Are you having pain? 8/10 Lower back     PRECAUTIONS: None  WEIGHT BEARING RESTRICTIONS: No  FALLS: Has patient fallen in last 6 months? Yes. Number of falls more than 10  LIVING ENVIRONMENT: Lives with: lives with their family Lives in: House/apartment Stairs: No Has following equipment at home: Environmental consultant - 2 wheeled, Wheelchair (manual), shower chair, and Grab bars  PLOF: Independent with household mobility with device and Independent with community mobility with device  PATIENT GOALS: want to be able to get him to stand and be more mobile and build his strength  OBJECTIVE:   COGNITION: Overall cognitive status: Impaired and History of cognitive impairments - at  baseline   SENSATION: WFL   POSTURE: rounded shoulders, forward head, increased thoracic kyphosis, and flexed trunk   LOWER EXTREMITY ROM:   limited with all motions due to weakness   LOWER EXTREMITY MMT:    MMT Right Eval Left Eval  Hip flexion 2-  2-  Hip extension    Hip abduction 2+ 2+  Hip adduction 2+ 2+  Hip internal rotation    Hip external rotation    Knee flexion    Knee extension 2- 2+  Ankle dorsiflexion 3+ 3+  Ankle plantarflexion    Ankle inversion    Ankle eversion    (Blank rows = not tested)  TRANSFERS: Assistive device utilized: Environmental consultant - 2 wheeled  Sit to stand: Max A  GAIT: Level of assistance: Max A as wife reports Comments: unable to assess. Wife reports he takes a few steps in the room with walker  FUNCTIONAL TESTS:  5 times sit to stand: unable to do   01/08/23 26.41sec 12/13/22 TUG = 36 seconds , 01/01/23 TUG = 26 seconds   TODAY'S TREATMENT:                                                                                                                              DATE:  02/25/23 Gait 1 lap ~ 157ft stopped 3 time due to L hip pain  Sitting hip abduction blue t-band 2x15 STS 2x10; 1 without hands CGA (couldn't complete by himself) Standing hip abd holding onto walker CGA L2x5 and R2x5 Standing marches with B 3lb ankle weights holding onto walker CGA 2x10 Nustep level 5 x 7 mins HS curls 45lbs 2x15 Leg extension 15lbs 2x15 Cable shoulder ext 10 lbs x10  02/21/23 Leg curls 45# 2x15 Leg extension 15# 3x10, 10# single legs x10 Leg press 60# 2x10, then single legs 20# 2x10 Nustep level 5 x 5 minues level 6 x 5 minutes 15# straight arm pulls Green tband clamshells 35# seated row 2x10 35# lats 2x10 Walking 100 feet with FWW  02/19/23 Nustep level 5 x 7 minutes Practiced stepping over tub to get in the shower, due to some fear and pain in the right hip he could not do this. Worked on problem solving this with him HS curls 45# 2x15 15# leg  extension Leg press 60# x2 had some knee pain, 40# 2x15, then single legs 20# 10# straight arm pulls standing Gait with FWW 110 feet  02/14/23 Nustep level 5 x 10 mintues HS curls 45lb 2x15 Leg extension 15# 2x15 Seated row 35# 3x12 Lats 35# 2x10 Leg press 60# 3x10 Standing side step length of mat table   02/12/23 Gait outside out back door, down curb and then around to the front of the building, multiple standing rests due to back pain and fatigue Nustep level 5 x 10 mintues LEg curls 45# 2x10 Leg extension 15# 2x10 Seated row 35# 2x10 Lats 35# 2x10 Leg press 70# 3x10  02/07/23 Nustep level 5 x 7 minutes S2S from elevated surface 2x10  Side step length of mat table x2 Standing ball Toss  Gait 3 laps w/ RW ~320 feet one standing  rest Leg curls 45# 2x12 Leg extension 10# 2x12, SL 5lb 2x12 each  MHP Lumbar spine x10  min  02/05/23 Gait out the back door down a curb then around to the front of the building, we had to take 1 standing rest breaks and one seated rest mostly due to back tightness and L hip pain Nustep level 5 x 7 minutes Leg curls 45# 2x12 Leg extension 10# 2x12, SL 5lb 2x10 each  Standing shoulder Ext green 2x15   01/31/23 Nustep level 5 x 7 minutes Gait 3 laps ~ 320 feet indoors w/ RW some low back tightness after 222ft Volleyball in standing Leg curls 45# 3x10 Leg extension 10# 3x10, SL 5lb 2x10 each  Leg press 60lb 3x10  Standing shoulder Ext green 2x15     PATIENT EDUCATION: Education details: POC and HEP  Person educated: Patient and Spouse Education method: Explanation Education comprehension: verbalized understanding  HOME EXERCISE PROGRAM: Access Code: [email or web address] URL: [email or web address] Date: 11/13/2022 Prepared by: Cassie Freer  Exercises - Seated March  - 1 x daily - 7 x weekly - 2 sets - 10 reps - Seated Knee Extension with Anchored Resistance  - 1 x daily - 7 x weekly - 2 sets - 10 reps - Seated Hip Abduction with  Resistance  - 1 x daily - 7 x weekly - 2 sets - 10 reps - Seated Hip Adduction Isometrics with Ball  - 1 x daily - 7 x weekly - 2 sets - 10 reps - Seated Shoulder Horizontal Abduction with Resistance  - 1 x daily - 7 x weekly - 2 sets - 10 reps  GOALS: Goals reviewed with patient? No  SHORT TERM GOALS: Target date: 12/25/22  Patient will be independent with initial HEP. Goal status: met 01/03/23  2.  Patient will demonstrate improved sitting posture in wheelchair Baseline: increased kyphosis and flexed trunk Goal status: progressing can correct with cues 02/05/23  3.  Patient will be modA with transfers Baseline: maxA Goal status: met 12/13/22   LONG TERM GOALS: Target date: 02/05/23  Patient will be independent with advanced/ongoing HEP to improve outcomes and carryover.  Goal status: progressing 02/21/23  2.  Patient will demonstrate improved functional LE strength as demonstrated by 4 or better. Baseline: 2/5  Goal status: Progressing 01/22/23  3.  Patient will be independent with sit to stand from wheelchair Baseline: maxA Goal status: Met 02/05/23  4.  Patient will be able to take at least 5 steps with rolling walker minA Baseline: per wife can take a few steps with modA Goal status: met 01/01/23   ASSESSMENT:  CLINICAL IMPRESSION: Patient presented with L hip pain that increased during gt at beginning of session. Vcs needed during STS for correct feet placement before standing and to stand up all the way. Left knee began to hurt during knee extension. He tolerated treatment well overall. Pt stated that he had dialysis today causing more fatigue. Pt would benefit from continued PT to increase activity tolerance and reduce assistance needed by caregivers.    OBJECTIVE IMPAIRMENTS: decreased activity tolerance, decreased coordination, decreased endurance, decreased knowledge of condition, decreased mobility, difficulty walking, decreased strength, improper body mechanics, postural  dysfunction, and obesity.   ACTIVITY LIMITATIONS: sitting, standing, squatting, stairs, transfers, bed mobility, bathing, toileting, dressing, hygiene/grooming, and locomotion level  PERSONAL FACTORS: Age, Behavior pattern, Fitness, Past/current experiences, Time since onset of injury/illness/exacerbation, and 3+ comorbidities: hx of stroke, heart and kidney transplant, hx of falls, dementia, DM, obesity, ESRD, Afib  are also affecting patient's functional outcome.   REHAB POTENTIAL: Fair    CLINICAL DECISION MAKING:  Evolving/moderate complexity  EVALUATION COMPLEXITY: Moderate   PLAN:  PT FREQUENCY: 2x/week  PT DURATION: 12 weeks  PLANNED INTERVENTIONS: Therapeutic exercises, Therapeutic activity, Neuromuscular re-education, Balance training, Gait training, Patient/Family education, Self Care, Joint mobilization, Stair training, Wheelchair mobility training, Cryotherapy, Moist heat, and Manual therapy  PLAN FOR NEXT SESSION: work on strength of the hips Grayce Sessions, PTA 02/25/2023, 3:13 PM

## 2023-02-26 DIAGNOSIS — Z941 Heart transplant status: Secondary | ICD-10-CM | POA: Diagnosis not present

## 2023-02-26 DIAGNOSIS — R413 Other amnesia: Secondary | ICD-10-CM | POA: Diagnosis not present

## 2023-02-26 DIAGNOSIS — I63511 Cerebral infarction due to unspecified occlusion or stenosis of right middle cerebral artery: Secondary | ICD-10-CM | POA: Diagnosis not present

## 2023-02-26 DIAGNOSIS — G629 Polyneuropathy, unspecified: Secondary | ICD-10-CM | POA: Diagnosis not present

## 2023-02-26 DIAGNOSIS — N186 End stage renal disease: Secondary | ICD-10-CM | POA: Diagnosis not present

## 2023-02-27 ENCOUNTER — Encounter: Payer: Self-pay | Admitting: Physical Therapy

## 2023-02-27 ENCOUNTER — Ambulatory Visit: Payer: Medicare Other | Admitting: Physical Therapy

## 2023-02-27 DIAGNOSIS — Z992 Dependence on renal dialysis: Secondary | ICD-10-CM | POA: Diagnosis not present

## 2023-02-27 DIAGNOSIS — R5381 Other malaise: Secondary | ICD-10-CM | POA: Diagnosis not present

## 2023-02-27 DIAGNOSIS — M545 Low back pain, unspecified: Secondary | ICD-10-CM | POA: Diagnosis not present

## 2023-02-27 DIAGNOSIS — D509 Iron deficiency anemia, unspecified: Secondary | ICD-10-CM | POA: Diagnosis not present

## 2023-02-27 DIAGNOSIS — G8929 Other chronic pain: Secondary | ICD-10-CM | POA: Diagnosis not present

## 2023-02-27 DIAGNOSIS — N186 End stage renal disease: Secondary | ICD-10-CM | POA: Diagnosis not present

## 2023-02-27 DIAGNOSIS — M6281 Muscle weakness (generalized): Secondary | ICD-10-CM | POA: Diagnosis not present

## 2023-02-27 DIAGNOSIS — R293 Abnormal posture: Secondary | ICD-10-CM | POA: Diagnosis not present

## 2023-02-27 DIAGNOSIS — R2681 Unsteadiness on feet: Secondary | ICD-10-CM | POA: Diagnosis not present

## 2023-02-27 DIAGNOSIS — E1129 Type 2 diabetes mellitus with other diabetic kidney complication: Secondary | ICD-10-CM | POA: Diagnosis not present

## 2023-02-27 DIAGNOSIS — D631 Anemia in chronic kidney disease: Secondary | ICD-10-CM | POA: Diagnosis not present

## 2023-02-27 DIAGNOSIS — N2581 Secondary hyperparathyroidism of renal origin: Secondary | ICD-10-CM | POA: Diagnosis not present

## 2023-02-27 NOTE — Therapy (Signed)
OUTPATIENT PHYSICAL THERAPY NEURO TREATMENT  Patient Name: [name] MRN: [id number] DOB:09-Jun-1961, 62 y.o., male Today's Date: 02/27/2023   PCP: Merri Brunette REFERRING PROVIDER: Jetta Lout  END OF SESSION:  PT End of Session - 02/27/23 1551     Visit Number 22    Date for PT Re-Evaluation 03/07/23    PT Start Time 1545    PT Stop Time 1625    PT Time Calculation (min) 40 min    Equipment Utilized During Treatment Gait belt    Activity Tolerance Patient tolerated treatment well    Behavior During Therapy WFL for tasks assessed/performed              Past Medical History:  Diagnosis Date   Arthritis    Atrial fibrillation (HCC)    CHF (congestive heart failure), NYHA class III (HCC)    s/p heart transplant   Chronic kidney disease    end stage   Chronic systolic dysfunction of left ventricle    CVA (cerebral infarction)    Diabetes mellitus    PMH; Prior to heart transplant   Family history of coronary artery disease    in both parents   GERD (gastroesophageal reflux disease)    Hyperlipidemia    Hypertension    Left bundle branch block    Morbid obesity (HCC)    status post lap band   Myocardial infarction [facility])    prior to heart transplant   Nonischemic cardiomyopathy (HCC)    prior to heart transplant   Obesity (BMI 30-39.9)    Obstructive sleep apnea    no longer needs CPAP after heart transplant per pt   Premature ventricular contractions    Prostate cancer (HCC)    SOB (shortness of breath)    Past Surgical History:  Procedure Laterality Date   CARDIAC PACEMAKER PLACEMENT  09/21/2009   Biventricular implantable cardioverter-defibrillator implantation      COLONOSCOPY W/ BIOPSIES AND POLYPECTOMY     CYSTOSCOPY WITH FULGERATION N/A 04/27/2021   Procedure: CYSTOSCOPY AND CLOT EVACUATION WITH BLADDER BIOPSY/ FUGARATION OF BLADDER/ FULGARATION OF PROSTATE ;  Surgeon: Jerilee Field, MD;  Location: [facility];  Service: Urology;  Laterality:  N/A;   FOOT SURGERY     left   HEART TRANSPLANT  2014   LAPAROSCOPIC GASTRIC BANDING  01/27/2007   LEFT VENTRICULAR ASSIST DEVICE     implanted at [facility]   PACEMAKER REMOVAL     PROSTATE BIOPSY     TOTAL HIP ARTHROPLASTY Right 07/22/2017   Procedure: RIGHT TOTAL HIP ARTHROPLASTY ANTERIOR APPROACH;  Surgeon: Samson Frederic, MD;  Location: [facility];  Service: Orthopedics;  Laterality: Right;  Needs RNFA   TOTAL KNEE ARTHROPLASTY Right 07/22/2017   Patient Active Problem List   Diagnosis Date Noted   AMS (altered mental status) 11/29/2022   Aspiration pneumonia (HCC) 11/29/2022   PNA (pneumonia) 11/29/2022   Peripheral autonomic neuropathy due to DM (HCC) 11/01/2022   Orthostatic hypotension 11/01/2022   Acute encephalopathy 07/04/2022   Chronic kidney disease, stage 5 (HCC) 11/14/2021   Gait abnormality 10/12/2021   Polyneuropathy associated with underlying disease (HCC) 10/12/2021   Neuropathic pain 10/12/2021   Morbid obesity (HCC) 11/19/2019   Sleep disorder 10/22/2019   Sleep disturbance 10/22/2019   Abnormality of gait 06/09/2019   Diabetic polyneuropathy associated with diabetes mellitus due to underlying condition (HCC) 04/14/2019   Peripheral neuropathy 12/11/2018   CVA (cerebral vascular accident) (HCC) 11/09/2018   H/O heart transplant (HCC) 11/09/2018  Malignant neoplasm of prostate (HCC) 05/06/2018   Closed posterior dislocation of hip, right, initial encounter (HCC) 08/24/2017   End stage renal disease on dialysis (HCC) 08/24/2017   Hip dislocation, right (HCC) 08/24/2017   Osteoarthritis of right hip 07/22/2017   VENTRICULAR TACHYCARDIA 12/21/2009   Dyslipidemia 08/30/2009   Essential hypertension 08/30/2009   ISCHEMIC CARDIOMYOPATHY 08/30/2009   Atrial fibrillation (HCC) 08/30/2009   VENTRICULAR ECTOPY 08/30/2009   History of CVA (cerebrovascular accident) 08/30/2009   Obesity, Class III, BMI 40-49.9 (morbid obesity) (HCC) 08/30/2009   DM type 2 with diabetic  peripheral neuropathy (HCC) 10/17/2007   OBSTRUCTIVE SLEEP APNEA 10/17/2007   CARDIOMYOPATHY, DILATED 10/17/2007    ONSET DATE: 10/31/22  REFERRING DIAG:  D84.9 (ICD-10-CM) - Immunodeficiency, unspecified Z94.1 (ICD-10-CM) - Heart transplant status Z48.298 (ICD-10-CM) - Encounter for aftercare following other organ transplant Z79.899 (ICD-10-CM) - Other long term (current) drug therapy G20.C (ICD-10-CM) - Parkinsonism, unspecified  THERAPY DIAG:  Physical deconditioning  Unsteadiness on feet  Muscle weakness (generalized)  History of falling  Chronic low back pain, unspecified back pain laterality, unspecified whether sciatica present  Abnormal posture  Other abnormalities of gait and mobility  Rationale for Evaluation and Treatment: Rehabilitation  SUBJECTIVE:                                                                                                                                                                                             SUBJECTIVE STATEMENT: Patient reports continued L groin pain. He went to the Dr who told him to hold off on abductor exercises.  Pt accompanied by: significant other  PERTINENT HISTORY: heart transplant 7 years ago  On dialysis MWF, heart transplant 6 yrs ago, ESRD, prostate CA, OSA, obesity, CHF, GERD, had DM, CVA with residual left hand weakness, peripheral neuropathy, R THA, AFib   PAIN:  Are you having pain? 8/10 Lower back     PRECAUTIONS: None  WEIGHT BEARING RESTRICTIONS: No  FALLS: Has patient fallen in last 6 months? Yes. Number of falls more than 10  LIVING ENVIRONMENT: Lives with: lives with their family Lives in: House/apartment Stairs: No Has following equipment at home: Environmental consultant - 2 wheeled, Wheelchair (manual), shower chair, and Grab bars  PLOF: Independent with household mobility with device and Independent with community mobility with device  PATIENT GOALS: want to be able to get him to stand and be more  mobile and build his strength  OBJECTIVE:   COGNITION: Overall cognitive status: Impaired and History of cognitive impairments - at baseline   SENSATION: WFL   POSTURE: rounded shoulders, forward head, increased thoracic kyphosis, and flexed trunk  LOWER EXTREMITY ROM:   limited with all motions due to weakness   LOWER EXTREMITY MMT:    MMT Right Eval Left Eval  Hip flexion 2- 2-  Hip extension    Hip abduction 2+ 2+  Hip adduction 2+ 2+  Hip internal rotation    Hip external rotation    Knee flexion    Knee extension 2- 2+  Ankle dorsiflexion 3+ 3+  Ankle plantarflexion    Ankle inversion    Ankle eversion    (Blank rows = not tested)  TRANSFERS: Assistive device utilized: Environmental consultant - 2 wheeled  Sit to stand: Max A  GAIT: Level of assistance: Max A as wife reports Comments: unable to assess. Wife reports he takes a few steps in the room with walker  FUNCTIONAL TESTS:  5 times sit to stand: unable to do   01/08/23 26.41sec 12/13/22 TUG = 36 seconds , 01/01/23 TUG = 26 seconds   TODAY'S TREATMENT:                                                                                                                              DATE:  02/27/23 NuStep L5 x 6 minutes, then 2 x 30 sec at L7, legs only. Ambulation x 100' with RW and S Leg curls 45#, B, 2 x 15 reps Knee Ext 15#, 2 x 15 reps Standing on Airex pad, weight shifts lat and ant/post, then mini squats, no UE support x 10 Standing step back and rotate to the R to reach behind and touch an elevated target on mat. 4 x each direction Transfers were all S today, including from the Knee flex/ext machine with is low. Tends to plop down.  02/25/23 Gait 1 lap ~ 19ft stopped 3 time due to L hip pain  Sitting hip abduction blue t-band 2x15 STS 2x10; 1 without hands CGA (couldn't complete by himself) Standing hip abd holding onto walker CGA L2x5 and R2x5 Standing marches with B 3lb ankle weights holding onto walker CGA  2x10 Nustep level 5 x 7 mins HS curls 45lbs 2x15 Leg extension 15lbs 2x15 Cable shoulder ext 10 lbs x10  02/21/23 Leg curls 45# 2x15 Leg extension 15# 3x10, 10# single legs x10 Leg press 60# 2x10, then single legs 20# 2x10 Nustep level 5 x 5 minues level 6 x 5 minutes 15# straight arm pulls Green tband clamshells 35# seated row 2x10 35# lats 2x10 Walking 100 feet with FWW  02/19/23 Nustep level 5 x 7 minutes Practiced stepping over tub to get in the shower, due to some fear and pain in the right hip he could not do this. Worked on problem solving this with him HS curls 45# 2x15 15# leg extension Leg press 60# x2 had some knee pain, 40# 2x15, then single legs 20# 10# straight arm pulls standing Gait with FWW 110 feet  02/14/23 Nustep level 5 x 10 mintues HS curls 45lb 2x15 Leg extension 15# 2x15  Seated row 35# 3x12 Lats 35# 2x10 Leg press 60# 3x10 Standing side step length of mat table  02/12/23 Gait outside out back door, down curb and then around to the front of the building, multiple standing rests due to back pain and fatigue Nustep level 5 x 10 mintues LEg curls 45# 2x10 Leg extension 15# 2x10 Seated row 35# 2x10 Lats 35# 2x10 Leg press 70# 3x10  02/07/23 Nustep level 5 x 7 minutes S2S from elevated surface 2x10  Side step length of mat table x2 Standing ball Toss  Gait 3 laps w/ RW ~320 feet one standing  rest Leg curls 45# 2x12 Leg extension 10# 2x12, SL 5lb 2x12 each  MHP Lumbar spine x10 min  02/05/23 Gait out the back door down a curb then around to the front of the building, we had to take 1 standing rest breaks and one seated rest mostly due to back tightness and L hip pain Nustep level 5 x 7 minutes Leg curls 45# 2x12 Leg extension 10# 2x12, SL 5lb 2x10 each  Standing shoulder Ext green 2x15   01/31/23 Nustep level 5 x 7 minutes Gait 3 laps ~ 320 feet indoors w/ RW some low back tightness after 232ft Volleyball in standing Leg curls 45# 3x10 Leg  extension 10# 3x10, SL 5lb 2x10 each  Leg press 60lb 3x10  Standing shoulder Ext green 2x15   PATIENT EDUCATION: Education details: POC and HEP  Person educated: Patient and Spouse Education method: Explanation Education comprehension: verbalized understanding  HOME EXERCISE PROGRAM: Access Code: [email or web address] URL: [email or web address] Date: 11/13/2022 Prepared by: Cassie Freer  Exercises - Seated March  - 1 x daily - 7 x weekly - 2 sets - 10 reps - Seated Knee Extension with Anchored Resistance  - 1 x daily - 7 x weekly - 2 sets - 10 reps - Seated Hip Abduction with Resistance  - 1 x daily - 7 x weekly - 2 sets - 10 reps - Seated Hip Adduction Isometrics with Ball  - 1 x daily - 7 x weekly - 2 sets - 10 reps - Seated Shoulder Horizontal Abduction with Resistance  - 1 x daily - 7 x weekly - 2 sets - 10 reps  GOALS: Goals reviewed with patient? No  SHORT TERM GOALS: Target date: 12/25/22  Patient will be independent with initial HEP. Goal status: met 01/03/23  2.  Patient will demonstrate improved sitting posture in wheelchair Baseline: increased kyphosis and flexed trunk Goal status: 02/27/23- Can verbalize, met  3.  Patient will be modA with transfers Baseline: maxA Goal status: met 12/13/22   LONG TERM GOALS: Target date: 02/05/23  Patient will be independent with advanced/ongoing HEP to improve outcomes and carryover.  Goal status: progressing 02/21/23  2.  Patient will demonstrate improved functional LE strength as demonstrated by 4 or better. Baseline: At least 3/5  Goal status: Progressing 02/27/23  3.  Patient will be independent with sit to stand from wheelchair Baseline: maxA Goal status: Met 02/05/23  4.  Patient will be able to take at least 5 steps with rolling walker minA Baseline: per wife can take a few steps with modA Goal status: met 01/01/23  ASSESSMENT:  CLINICAL IMPRESSION: Patient reports no new issues. He saw the Dr regarding his hip pain.  The Dr told him to hold off on hip abd strengthening. Progressed his strengthening, incorporated balance and weight shifts over BOS to improve stability in stand.  OBJECTIVE IMPAIRMENTS: decreased activity tolerance, decreased coordination,  decreased endurance, decreased knowledge of condition, decreased mobility, difficulty walking, decreased strength, improper body mechanics, postural dysfunction, and obesity.   ACTIVITY LIMITATIONS: sitting, standing, squatting, stairs, transfers, bed mobility, bathing, toileting, dressing, hygiene/grooming, and locomotion level  PERSONAL FACTORS: Age, Behavior pattern, Fitness, Past/current experiences, Time since onset of injury/illness/exacerbation, and 3+ comorbidities: hx of stroke, heart and kidney transplant, hx of falls, dementia, DM, obesity, ESRD, Afib  are also affecting patient's functional outcome.   REHAB POTENTIAL: Fair    CLINICAL DECISION MAKING: Evolving/moderate complexity  EVALUATION COMPLEXITY: Moderate   PLAN:  PT FREQUENCY: 2x/week  PT DURATION: 12 weeks  PLANNED INTERVENTIONS: Therapeutic exercises, Therapeutic activity, Neuromuscular re-education, Balance training, Gait training, Patient/Family education, Self Care, Joint mobilization, Stair training, Wheelchair mobility training, Cryotherapy, Moist heat, and Manual therapy  PLAN FOR NEXT SESSION: work on strength of the hips Iona Beard, DPT 02/27/2023, 4:31 PM

## 2023-03-01 DIAGNOSIS — Z992 Dependence on renal dialysis: Secondary | ICD-10-CM | POA: Diagnosis not present

## 2023-03-01 DIAGNOSIS — N2581 Secondary hyperparathyroidism of renal origin: Secondary | ICD-10-CM | POA: Diagnosis not present

## 2023-03-01 DIAGNOSIS — D631 Anemia in chronic kidney disease: Secondary | ICD-10-CM | POA: Diagnosis not present

## 2023-03-01 DIAGNOSIS — N186 End stage renal disease: Secondary | ICD-10-CM | POA: Diagnosis not present

## 2023-03-01 DIAGNOSIS — E1129 Type 2 diabetes mellitus with other diabetic kidney complication: Secondary | ICD-10-CM | POA: Diagnosis not present

## 2023-03-01 DIAGNOSIS — D509 Iron deficiency anemia, unspecified: Secondary | ICD-10-CM | POA: Diagnosis not present

## 2023-03-04 ENCOUNTER — Ambulatory Visit: Payer: Medicare Other | Admitting: Physical Therapy

## 2023-03-04 DIAGNOSIS — N186 End stage renal disease: Secondary | ICD-10-CM | POA: Diagnosis not present

## 2023-03-04 DIAGNOSIS — N2581 Secondary hyperparathyroidism of renal origin: Secondary | ICD-10-CM | POA: Diagnosis not present

## 2023-03-04 DIAGNOSIS — Z992 Dependence on renal dialysis: Secondary | ICD-10-CM | POA: Diagnosis not present

## 2023-03-04 DIAGNOSIS — E1129 Type 2 diabetes mellitus with other diabetic kidney complication: Secondary | ICD-10-CM | POA: Diagnosis not present

## 2023-03-04 DIAGNOSIS — D631 Anemia in chronic kidney disease: Secondary | ICD-10-CM | POA: Diagnosis not present

## 2023-03-04 DIAGNOSIS — D509 Iron deficiency anemia, unspecified: Secondary | ICD-10-CM | POA: Diagnosis not present

## 2023-03-06 ENCOUNTER — Ambulatory Visit: Payer: Medicare Other | Admitting: Physical Therapy

## 2023-03-06 ENCOUNTER — Encounter: Payer: Self-pay | Admitting: Physical Therapy

## 2023-03-06 DIAGNOSIS — R5381 Other malaise: Secondary | ICD-10-CM

## 2023-03-06 DIAGNOSIS — N2581 Secondary hyperparathyroidism of renal origin: Secondary | ICD-10-CM | POA: Diagnosis not present

## 2023-03-06 DIAGNOSIS — M6281 Muscle weakness (generalized): Secondary | ICD-10-CM

## 2023-03-06 DIAGNOSIS — Z9181 History of falling: Secondary | ICD-10-CM

## 2023-03-06 DIAGNOSIS — M545 Low back pain, unspecified: Secondary | ICD-10-CM | POA: Diagnosis not present

## 2023-03-06 DIAGNOSIS — R2681 Unsteadiness on feet: Secondary | ICD-10-CM

## 2023-03-06 DIAGNOSIS — G8929 Other chronic pain: Secondary | ICD-10-CM

## 2023-03-06 DIAGNOSIS — D509 Iron deficiency anemia, unspecified: Secondary | ICD-10-CM | POA: Diagnosis not present

## 2023-03-06 DIAGNOSIS — R293 Abnormal posture: Secondary | ICD-10-CM | POA: Diagnosis not present

## 2023-03-06 DIAGNOSIS — D631 Anemia in chronic kidney disease: Secondary | ICD-10-CM | POA: Diagnosis not present

## 2023-03-06 DIAGNOSIS — E1129 Type 2 diabetes mellitus with other diabetic kidney complication: Secondary | ICD-10-CM | POA: Diagnosis not present

## 2023-03-06 DIAGNOSIS — Z992 Dependence on renal dialysis: Secondary | ICD-10-CM | POA: Diagnosis not present

## 2023-03-06 DIAGNOSIS — N186 End stage renal disease: Secondary | ICD-10-CM | POA: Diagnosis not present

## 2023-03-06 NOTE — Therapy (Signed)
OUTPATIENT PHYSICAL THERAPY NEURO TREATMENT  Patient Name: [name] MRN: [id number] DOB:01-07-1961, 62 y.o., male Today's Date: 03/06/2023   PCP: Merri Brunette REFERRING PROVIDER: Jetta Lout  END OF SESSION:  PT End of Session - 03/06/23 1306     Visit Number 23    Date for PT Re-Evaluation 03/07/23    PT Start Time 1300    PT Stop Time 1345    PT Time Calculation (min) 45 min    Activity Tolerance Patient tolerated treatment well    Behavior During Therapy WFL for tasks assessed/performed              Past Medical History:  Diagnosis Date   Arthritis    Atrial fibrillation (HCC)    CHF (congestive heart failure), NYHA class III (HCC)    s/p heart transplant   Chronic kidney disease    end stage   Chronic systolic dysfunction of left ventricle    CVA (cerebral infarction)    Diabetes mellitus    PMH; Prior to heart transplant   Family history of coronary artery disease    in both parents   GERD (gastroesophageal reflux disease)    Hyperlipidemia    Hypertension    Left bundle branch block    Morbid obesity (HCC)    status post lap band   Myocardial infarction [facility])    prior to heart transplant   Nonischemic cardiomyopathy (HCC)    prior to heart transplant   Obesity (BMI 30-39.9)    Obstructive sleep apnea    no longer needs CPAP after heart transplant per pt   Premature ventricular contractions    Prostate cancer (HCC)    SOB (shortness of breath)    Past Surgical History:  Procedure Laterality Date   CARDIAC PACEMAKER PLACEMENT  09/21/2009   Biventricular implantable cardioverter-defibrillator implantation      COLONOSCOPY W/ BIOPSIES AND POLYPECTOMY     CYSTOSCOPY WITH FULGERATION N/A 04/27/2021   Procedure: CYSTOSCOPY AND CLOT EVACUATION WITH BLADDER BIOPSY/ FUGARATION OF BLADDER/ FULGARATION OF PROSTATE ;  Surgeon: Jerilee Field, MD;  Location: [facility];  Service: Urology;  Laterality: N/A;   FOOT SURGERY     left   HEART TRANSPLANT   2014   LAPAROSCOPIC GASTRIC BANDING  01/27/2007   LEFT VENTRICULAR ASSIST DEVICE     implanted at [facility]   PACEMAKER REMOVAL     PROSTATE BIOPSY     TOTAL HIP ARTHROPLASTY Right 07/22/2017   Procedure: RIGHT TOTAL HIP ARTHROPLASTY ANTERIOR APPROACH;  Surgeon: Samson Frederic, MD;  Location: [facility];  Service: Orthopedics;  Laterality: Right;  Needs RNFA   TOTAL KNEE ARTHROPLASTY Right 07/22/2017   Patient Active Problem List   Diagnosis Date Noted   AMS (altered mental status) 11/29/2022   Aspiration pneumonia (HCC) 11/29/2022   PNA (pneumonia) 11/29/2022   Peripheral autonomic neuropathy due to DM (HCC) 11/01/2022   Orthostatic hypotension 11/01/2022   Acute encephalopathy 07/04/2022   Chronic kidney disease, stage 5 (HCC) 11/14/2021   Gait abnormality 10/12/2021   Polyneuropathy associated with underlying disease (HCC) 10/12/2021   Neuropathic pain 10/12/2021   Morbid obesity (HCC) 11/19/2019   Sleep disorder 10/22/2019   Sleep disturbance 10/22/2019   Abnormality of gait 06/09/2019   Diabetic polyneuropathy associated with diabetes mellitus due to underlying condition (HCC) 04/14/2019   Peripheral neuropathy 12/11/2018   CVA (cerebral vascular accident) (HCC) 11/09/2018   H/O heart transplant (HCC) 11/09/2018   Malignant neoplasm of prostate (HCC) 05/06/2018  Closed posterior dislocation of hip, right, initial encounter (HCC) 08/24/2017   End stage renal disease on dialysis (HCC) 08/24/2017   Hip dislocation, right (HCC) 08/24/2017   Osteoarthritis of right hip 07/22/2017   VENTRICULAR TACHYCARDIA 12/21/2009   Dyslipidemia 08/30/2009   Essential hypertension 08/30/2009   ISCHEMIC CARDIOMYOPATHY 08/30/2009   Atrial fibrillation (HCC) 08/30/2009   VENTRICULAR ECTOPY 08/30/2009   History of CVA (cerebrovascular accident) 08/30/2009   Obesity, Class III, BMI 40-49.9 (morbid obesity) (HCC) 08/30/2009   DM type 2 with diabetic peripheral neuropathy (HCC) 10/17/2007    OBSTRUCTIVE SLEEP APNEA 10/17/2007   CARDIOMYOPATHY, DILATED 10/17/2007    ONSET DATE: 10/31/22  REFERRING DIAG:  D84.9 (ICD-10-CM) - Immunodeficiency, unspecified Z94.1 (ICD-10-CM) - Heart transplant status Z48.298 (ICD-10-CM) - Encounter for aftercare following other organ transplant Z79.899 (ICD-10-CM) - Other long term (current) drug therapy G20.C (ICD-10-CM) - Parkinsonism, unspecified  THERAPY DIAG:  Unsteadiness on feet  Physical deconditioning  Muscle weakness (generalized)  History of falling  Chronic low back pain, unspecified back pain laterality, unspecified whether sciatica present  Rationale for Evaluation and Treatment: Rehabilitation  SUBJECTIVE:                                                                                                                                                                                             SUBJECTIVE STATEMENT: Im all right, having some back pain  Pt accompanied by: significant other  PERTINENT HISTORY: heart transplant 7 years ago  On dialysis MWF, heart transplant 6 yrs ago, ESRD, prostate CA, OSA, obesity, CHF, GERD, had DM, CVA with residual left hand weakness, peripheral neuropathy, R THA, AFib   PAIN:  Are you having pain? 8/10 Lower back     PRECAUTIONS: None  WEIGHT BEARING RESTRICTIONS: No  FALLS: Has patient fallen in last 6 months? Yes. Number of falls more than 10  LIVING ENVIRONMENT: Lives with: lives with their family Lives in: House/apartment Stairs: No Has following equipment at home: Environmental consultant - 2 wheeled, Wheelchair (manual), shower chair, and Grab bars  PLOF: Independent with household mobility with device and Independent with community mobility with device  PATIENT GOALS: want to be able to get him to stand and be more mobile and build his strength  OBJECTIVE:   COGNITION: Overall cognitive status: Impaired and History of cognitive impairments - at  baseline   SENSATION: WFL   POSTURE: rounded shoulders, forward head, increased thoracic kyphosis, and flexed trunk   LOWER EXTREMITY ROM:   limited with all motions due to weakness   LOWER EXTREMITY MMT:    MMT Right Eval Left Eval  Hip flexion 2-  2-  Hip extension    Hip abduction 2+ 2+  Hip adduction 2+ 2+  Hip internal rotation    Hip external rotation    Knee flexion    Knee extension 2- 2+  Ankle dorsiflexion 3+ 3+  Ankle plantarflexion    Ankle inversion    Ankle eversion    (Blank rows = not tested)  TRANSFERS: Assistive device utilized: Environmental consultant - 2 wheeled  Sit to stand: Max A  GAIT: Level of assistance: Max A as wife reports Comments: unable to assess. Wife reports he takes a few steps in the room with walker  FUNCTIONAL TESTS:  5 times sit to stand: unable to do   01/08/23 26.41sec 12/13/22 TUG = 36 seconds , 01/01/23 TUG = 26 seconds   TODAY'S TREATMENT:                                                                                                                              DATE:  03/06/23 NuStep L5 x 7 min Gt 1 lap sup; break after due to fatigue and pain Standing ext green t-band 2x10 Standing Chest press yellow ball x 5  Seated rows blue t-band 2x10 STM low back HS curls 45lb 2x15 Leg Ext 15lb 2x10 Ab sets with orange ball 2x10 Seated march B 3lb 2x10   02/27/23 NuStep L5 x 6 minutes, then 2 x 30 sec at L7, legs only. Ambulation x 100' with RW and S Leg curls 45#, B, 2 x 15 reps Knee Ext 15#, 2 x 15 reps Standing on Airex pad, weight shifts lat and ant/post, then mini squats, no UE support x 10 Standing step back and rotate to the R to reach behind and touch an elevated target on mat. 4 x each direction Transfers were all S today, including from the Knee flex/ext machine with is low. Tends to plop down.  02/25/23 Gait 1 lap ~ 17ft stopped 3 time due to L hip pain  Sitting hip abduction blue t-band 2x15 STS 2x10; 1 without hands CGA  (couldn't complete by himself) Standing hip abd holding onto walker CGA L2x5 and R2x5 Standing marches with B 3lb ankle weights holding onto walker CGA 2x10 Nustep level 5 x 7 mins HS curls 45lbs 2x15 Leg extension 15lbs 2x15 Cable shoulder ext 10 lbs x10  02/21/23 Leg curls 45# 2x15 Leg extension 15# 3x10, 10# single legs x10 Leg press 60# 2x10, then single legs 20# 2x10 Nustep level 5 x 5 minues level 6 x 5 minutes 15# straight arm pulls Green tband clamshells 35# seated row 2x10 35# lats 2x10 Walking 100 feet with FWW  02/19/23 Nustep level 5 x 7 minutes Practiced stepping over tub to get in the shower, due to some fear and pain in the right hip he could not do this. Worked on problem solving this with him HS curls 45# 2x15 15# leg extension Leg press 60# x2 had some knee pain, 40# 2x15,  then single legs 20# 10# straight arm pulls standing Gait with FWW 110 feet  02/14/23 Nustep level 5 x 10 mintues HS curls 45lb 2x15 Leg extension 15# 2x15 Seated row 35# 3x12 Lats 35# 2x10 Leg press 60# 3x10 Standing side step length of mat table  02/12/23 Gait outside out back door, down curb and then around to the front of the building, multiple standing rests due to back pain and fatigue Nustep level 5 x 10 mintues LEg curls 45# 2x10 Leg extension 15# 2x10 Seated row 35# 2x10 Lats 35# 2x10 Leg press 70# 3x10  02/07/23 Nustep level 5 x 7 minutes S2S from elevated surface 2x10  Side step length of mat table x2 Standing ball Toss  Gait 3 laps w/ RW ~320 feet one standing  rest Leg curls 45# 2x12 Leg extension 10# 2x12, SL 5lb 2x12 each  MHP Lumbar spine x10 min  02/05/23 Gait out the back door down a curb then around to the front of the building, we had to take 1 standing rest breaks and one seated rest mostly due to back tightness and L hip pain Nustep level 5 x 7 minutes Leg curls 45# 2x12 Leg extension 10# 2x12, SL 5lb 2x10 each  Standing shoulder Ext green 2x15    01/31/23 Nustep level 5 x 7 minutes Gait 3 laps ~ 320 feet indoors w/ RW some low back tightness after 2101ft Volleyball in standing Leg curls 45# 3x10 Leg extension 10# 3x10, SL 5lb 2x10 each  Leg press 60lb 3x10  Standing shoulder Ext green 2x15   PATIENT EDUCATION: Education details: POC and HEP  Person educated: Patient and Spouse Education method: Explanation Education comprehension: verbalized understanding  HOME EXERCISE PROGRAM: Access Code: [email or web address] URL: [email or web address] Date: 11/13/2022 Prepared by: Cassie Freer  Exercises - Seated March  - 1 x daily - 7 x weekly - 2 sets - 10 reps - Seated Knee Extension with Anchored Resistance  - 1 x daily - 7 x weekly - 2 sets - 10 reps - Seated Hip Abduction with Resistance  - 1 x daily - 7 x weekly - 2 sets - 10 reps - Seated Hip Adduction Isometrics with Ball  - 1 x daily - 7 x weekly - 2 sets - 10 reps - Seated Shoulder Horizontal Abduction with Resistance  - 1 x daily - 7 x weekly - 2 sets - 10 reps  GOALS: Goals reviewed with patient? No  SHORT TERM GOALS: Target date: 12/25/22  Patient will be independent with initial HEP. Goal status: met 01/03/23  2.  Patient will demonstrate improved sitting posture in wheelchair Baseline: increased kyphosis and flexed trunk Goal status: 02/27/23- Can verbalize, met  3.  Patient will be modA with transfers Baseline: maxA Goal status: met 12/13/22   LONG TERM GOALS: Target date: 02/05/23  Patient will be independent with advanced/ongoing HEP to improve outcomes and carryover.  Goal status: progressing 02/21/23  2.  Patient will demonstrate improved functional LE strength as demonstrated by 4 or better. Baseline: At least 3/5  Goal status: Progressing 02/27/23  3.  Patient will be independent with sit to stand from wheelchair Baseline: maxA Goal status: Met 02/05/23  4.  Patient will be able to take at least 5 steps with rolling walker minA Baseline: per wife  can take a few steps with modA Goal status: met 01/01/23  ASSESSMENT:  CLINICAL IMPRESSION: Pt enters reporting that he was all right despite 8/10 back pain. After ambulating one lap ~  100 feet pt reports that he needed to stop. Therapist suggested that he rest so we could walk another lap. I asked pt what was going on because he  used to be willing to walk more. Pt stated that he has more back issues. Pt reported going to the Md recently and he has more ruptured disks. When I proceeded to ask questions about his recent MD appointment he became agitated.  Remainder of session was done in sitting to avoid any increase in back pain, which stayed the same. Postural cues needed with rows and extensions.  Decreased TKE with leg extensions.    OBJECTIVE IMPAIRMENTS: decreased activity tolerance, decreased coordination, decreased endurance, decreased knowledge of condition, decreased mobility, difficulty walking, decreased strength, improper body mechanics, postural dysfunction, and obesity.   ACTIVITY LIMITATIONS: sitting, standing, squatting, stairs, transfers, bed mobility, bathing, toileting, dressing, hygiene/grooming, and locomotion level  PERSONAL FACTORS: Age, Behavior pattern, Fitness, Past/current experiences, Time since onset of injury/illness/exacerbation, and 3+ comorbidities: hx of stroke, heart and kidney transplant, hx of falls, dementia, DM, obesity, ESRD, Afib  are also affecting patient's functional outcome.   REHAB POTENTIAL: Fair    CLINICAL DECISION MAKING: Evolving/moderate complexity  EVALUATION COMPLEXITY: Moderate   PLAN:  PT FREQUENCY: 2x/week  PT DURATION: 12 weeks  PLANNED INTERVENTIONS: Therapeutic exercises, Therapeutic activity, Neuromuscular re-education, Balance training, Gait training, Patient/Family education, Self Care, Joint mobilization, Stair training, Wheelchair mobility training, Cryotherapy, Moist heat, and Manual therapy  PLAN FOR NEXT SESSION: Possible  re cert or D/C. Possible decline in function.  Debroah Baller, PTA 03/06/2023, 1:06 PM

## 2023-03-07 DIAGNOSIS — M25472 Effusion, left ankle: Secondary | ICD-10-CM | POA: Diagnosis not present

## 2023-03-07 DIAGNOSIS — M14671 Charcot's joint, right ankle and foot: Secondary | ICD-10-CM | POA: Diagnosis not present

## 2023-03-07 DIAGNOSIS — M79671 Pain in right foot: Secondary | ICD-10-CM | POA: Diagnosis not present

## 2023-03-08 DIAGNOSIS — N186 End stage renal disease: Secondary | ICD-10-CM | POA: Diagnosis not present

## 2023-03-08 DIAGNOSIS — Z992 Dependence on renal dialysis: Secondary | ICD-10-CM | POA: Diagnosis not present

## 2023-03-08 DIAGNOSIS — D631 Anemia in chronic kidney disease: Secondary | ICD-10-CM | POA: Diagnosis not present

## 2023-03-08 DIAGNOSIS — D509 Iron deficiency anemia, unspecified: Secondary | ICD-10-CM | POA: Diagnosis not present

## 2023-03-08 DIAGNOSIS — E1129 Type 2 diabetes mellitus with other diabetic kidney complication: Secondary | ICD-10-CM | POA: Diagnosis not present

## 2023-03-08 DIAGNOSIS — N2581 Secondary hyperparathyroidism of renal origin: Secondary | ICD-10-CM | POA: Diagnosis not present

## 2023-03-11 DIAGNOSIS — D509 Iron deficiency anemia, unspecified: Secondary | ICD-10-CM | POA: Diagnosis not present

## 2023-03-11 DIAGNOSIS — N2581 Secondary hyperparathyroidism of renal origin: Secondary | ICD-10-CM | POA: Diagnosis not present

## 2023-03-11 DIAGNOSIS — D631 Anemia in chronic kidney disease: Secondary | ICD-10-CM | POA: Diagnosis not present

## 2023-03-11 DIAGNOSIS — N186 End stage renal disease: Secondary | ICD-10-CM | POA: Diagnosis not present

## 2023-03-11 DIAGNOSIS — E1129 Type 2 diabetes mellitus with other diabetic kidney complication: Secondary | ICD-10-CM | POA: Diagnosis not present

## 2023-03-11 DIAGNOSIS — Z992 Dependence on renal dialysis: Secondary | ICD-10-CM | POA: Diagnosis not present

## 2023-03-13 ENCOUNTER — Ambulatory Visit: Payer: Medicare Other

## 2023-03-13 ENCOUNTER — Other Ambulatory Visit: Payer: Self-pay

## 2023-03-13 DIAGNOSIS — Z9181 History of falling: Secondary | ICD-10-CM

## 2023-03-13 DIAGNOSIS — R2681 Unsteadiness on feet: Secondary | ICD-10-CM | POA: Diagnosis not present

## 2023-03-13 DIAGNOSIS — N2581 Secondary hyperparathyroidism of renal origin: Secondary | ICD-10-CM | POA: Diagnosis not present

## 2023-03-13 DIAGNOSIS — M545 Low back pain, unspecified: Secondary | ICD-10-CM

## 2023-03-13 DIAGNOSIS — R5381 Other malaise: Secondary | ICD-10-CM | POA: Diagnosis not present

## 2023-03-13 DIAGNOSIS — E1129 Type 2 diabetes mellitus with other diabetic kidney complication: Secondary | ICD-10-CM | POA: Diagnosis not present

## 2023-03-13 DIAGNOSIS — R293 Abnormal posture: Secondary | ICD-10-CM | POA: Diagnosis not present

## 2023-03-13 DIAGNOSIS — M6281 Muscle weakness (generalized): Secondary | ICD-10-CM | POA: Diagnosis not present

## 2023-03-13 DIAGNOSIS — D509 Iron deficiency anemia, unspecified: Secondary | ICD-10-CM | POA: Diagnosis not present

## 2023-03-13 DIAGNOSIS — D631 Anemia in chronic kidney disease: Secondary | ICD-10-CM | POA: Diagnosis not present

## 2023-03-13 DIAGNOSIS — Z992 Dependence on renal dialysis: Secondary | ICD-10-CM | POA: Diagnosis not present

## 2023-03-13 DIAGNOSIS — G8929 Other chronic pain: Secondary | ICD-10-CM | POA: Diagnosis not present

## 2023-03-13 DIAGNOSIS — N186 End stage renal disease: Secondary | ICD-10-CM | POA: Diagnosis not present

## 2023-03-13 NOTE — Therapy (Signed)
OUTPATIENT PHYSICAL THERAPY NEURO TREATMENT  Patient Name: [name] MRN: [id number] DOB:Dec 25, 1960, 62 y.o., male Today's Date: 03/13/2023   PCP: Merri Brunette REFERRING PROVIDER: Jetta Lout  END OF SESSION:  PT End of Session - 03/13/23 1312     Visit Number 24    Date for PT Re-Evaluation 03/07/23    PT Start Time 1300    PT Stop Time 1345    PT Time Calculation (min) 45 min    Activity Tolerance Patient tolerated treatment well    Behavior During Therapy WFL for tasks assessed/performed               Past Medical History:  Diagnosis Date   Arthritis    Atrial fibrillation (HCC)    CHF (congestive heart failure), NYHA class III (HCC)    s/p heart transplant   Chronic kidney disease    end stage   Chronic systolic dysfunction of left ventricle    CVA (cerebral infarction)    Diabetes mellitus    PMH; Prior to heart transplant   Family history of coronary artery disease    in both parents   GERD (gastroesophageal reflux disease)    Hyperlipidemia    Hypertension    Left bundle branch block    Morbid obesity (HCC)    status post lap band   Myocardial infarction [facility])    prior to heart transplant   Nonischemic cardiomyopathy (HCC)    prior to heart transplant   Obesity (BMI 30-39.9)    Obstructive sleep apnea    no longer needs CPAP after heart transplant per pt   Premature ventricular contractions    Prostate cancer (HCC)    SOB (shortness of breath)    Past Surgical History:  Procedure Laterality Date   CARDIAC PACEMAKER PLACEMENT  09/21/2009   Biventricular implantable cardioverter-defibrillator implantation      COLONOSCOPY W/ BIOPSIES AND POLYPECTOMY     CYSTOSCOPY WITH FULGERATION N/A 04/27/2021   Procedure: CYSTOSCOPY AND CLOT EVACUATION WITH BLADDER BIOPSY/ FUGARATION OF BLADDER/ FULGARATION OF PROSTATE ;  Surgeon: Jerilee Field, MD;  Location: [facility];  Service: Urology;  Laterality: N/A;   FOOT SURGERY     left   HEART  TRANSPLANT  2014   LAPAROSCOPIC GASTRIC BANDING  01/27/2007   LEFT VENTRICULAR ASSIST DEVICE     implanted at [facility]   PACEMAKER REMOVAL     PROSTATE BIOPSY     TOTAL HIP ARTHROPLASTY Right 07/22/2017   Procedure: RIGHT TOTAL HIP ARTHROPLASTY ANTERIOR APPROACH;  Surgeon: Samson Frederic, MD;  Location: [facility];  Service: Orthopedics;  Laterality: Right;  Needs RNFA   TOTAL KNEE ARTHROPLASTY Right 07/22/2017   Patient Active Problem List   Diagnosis Date Noted   AMS (altered mental status) 11/29/2022   Aspiration pneumonia (HCC) 11/29/2022   PNA (pneumonia) 11/29/2022   Peripheral autonomic neuropathy due to DM (HCC) 11/01/2022   Orthostatic hypotension 11/01/2022   Acute encephalopathy 07/04/2022   Chronic kidney disease, stage 5 (HCC) 11/14/2021   Gait abnormality 10/12/2021   Polyneuropathy associated with underlying disease (HCC) 10/12/2021   Neuropathic pain 10/12/2021   Morbid obesity (HCC) 11/19/2019   Sleep disorder 10/22/2019   Sleep disturbance 10/22/2019   Abnormality of gait 06/09/2019   Diabetic polyneuropathy associated with diabetes mellitus due to underlying condition (HCC) 04/14/2019   Peripheral neuropathy 12/11/2018   CVA (cerebral vascular accident) (HCC) 11/09/2018   H/O heart transplant (HCC) 11/09/2018   Malignant neoplasm of prostate (HCC) 05/06/2018  Closed posterior dislocation of hip, right, initial encounter (HCC) 08/24/2017   End stage renal disease on dialysis (HCC) 08/24/2017   Hip dislocation, right (HCC) 08/24/2017   Osteoarthritis of right hip 07/22/2017   VENTRICULAR TACHYCARDIA 12/21/2009   Dyslipidemia 08/30/2009   Essential hypertension 08/30/2009   ISCHEMIC CARDIOMYOPATHY 08/30/2009   Atrial fibrillation (HCC) 08/30/2009   VENTRICULAR ECTOPY 08/30/2009   History of CVA (cerebrovascular accident) 08/30/2009   Obesity, Class III, BMI 40-49.9 (morbid obesity) (HCC) 08/30/2009   DM type 2 with diabetic peripheral neuropathy (HCC) 10/17/2007    OBSTRUCTIVE SLEEP APNEA 10/17/2007   CARDIOMYOPATHY, DILATED 10/17/2007    ONSET DATE: 10/31/22  REFERRING DIAG:  D84.9 (ICD-10-CM) - Immunodeficiency, unspecified Z94.1 (ICD-10-CM) - Heart transplant status Z48.298 (ICD-10-CM) - Encounter for aftercare following other organ transplant Z79.899 (ICD-10-CM) - Other long term (current) drug therapy G20.C (ICD-10-CM) - Parkinsonism, unspecified  THERAPY DIAG:  Unsteadiness on feet  Physical deconditioning  Muscle weakness (generalized)  History of falling  Chronic low back pain, unspecified back pain laterality, unspecified whether sciatica present  Rationale for Evaluation and Treatment: Rehabilitation  SUBJECTIVE:                                                                                                                                                                                             SUBJECTIVE STATEMENT: L hip really hurts, 8/10 constantly  Pt accompanied by: significant other  PERTINENT HISTORY: heart transplant 7 years ago  On dialysis MWF, heart transplant 6 yrs ago, ESRD, prostate CA, OSA, obesity, CHF, GERD, had DM, CVA with residual left hand weakness, peripheral neuropathy, R THA, AFib   PAIN:  Are you having pain? 8/10 Lower back     PRECAUTIONS: None  WEIGHT BEARING RESTRICTIONS: No  FALLS: Has patient fallen in last 6 months? Yes. Number of falls more than 10  LIVING ENVIRONMENT: Lives with: lives with their family Lives in: House/apartment Stairs: No Has following equipment at home: Environmental consultant - 2 wheeled, Wheelchair (manual), shower chair, and Grab bars  PLOF: Independent with household mobility with device and Independent with community mobility with device  PATIENT GOALS: want to be able to get him to stand and be more mobile and build his strength  OBJECTIVE:   COGNITION: Overall cognitive status: Impaired and History of cognitive impairments - at  baseline   SENSATION: WFL   POSTURE: rounded shoulders, forward head, increased thoracic kyphosis, and flexed trunk   LOWER EXTREMITY ROM:   limited with all motions due to weakness   LOWER EXTREMITY MMT:    MMT Right Eval Left Eval  Hip flexion 2- 2-  Hip extension    Hip abduction 2+ 2+  Hip adduction 2+ 2+  Hip internal rotation    Hip external rotation    Knee flexion    Knee extension 2- 2+  Ankle dorsiflexion 3+ 3+  Ankle plantarflexion    Ankle inversion    Ankle eversion    (Blank rows = not tested)  TRANSFERS: Assistive device utilized: Environmental consultant - 2 wheeled  Sit to stand: Max A  GAIT: Level of assistance: Max A as wife reports Comments: unable to assess. Wife reports he takes a few steps in the room with walker  FUNCTIONAL TESTS:  5 times sit to stand: unable to do   01/08/23 26.41sec 12/13/22 TUG = 36 seconds , 01/01/23 TUG = 26 seconds   TODAY'S TREATMENT:                                                                                                                              DATE:  03/13/23: Nustep level 5, Ue's and LE's x 7 min Gait with FWW from lobby to gym, 60' Standing for B shoulder ext with black t band 10 reps, 2 sets( in front of hi/low table Standing for chest punches with yellow mediball, 10 reps HS curls 45lb 2x15 Leg Ext 15lb 2x10 Gait from gym to lobby 60' at end of session  03/06/23 NuStep L5 x 7 min Gt 1 lap sup; break after due to fatigue and pain Standing ext green t-band 2x10 Standing Chest press yellow ball x 5  Seated rows blue t-band 2x10 STM low back HS curls 45lb 2x15 Leg Ext 15lb 2x10 Ab sets with orange ball 2x10 Seated march B 3lb 2x10   02/27/23 NuStep L5 x 6 minutes, then 2 x 30 sec at L7, legs only. Ambulation x 100' with RW and S Leg curls 45#, B, 2 x 15 reps Knee Ext 15#, 2 x 15 reps Standing on Airex pad, weight shifts lat and ant/post, then mini squats, no UE support x 10 Standing step back and rotate to  the R to reach behind and touch an elevated target on mat. 4 x each direction Transfers were all S today, including from the Knee flex/ext machine with is low. Tends to plop down.  02/25/23 Gait 1 lap ~ 141ft stopped 3 time due to L hip pain  Sitting hip abduction blue t-band 2x15 STS 2x10; 1 without hands CGA (couldn't complete by himself) Standing hip abd holding onto walker CGA L2x5 and R2x5 Standing marches with B 3lb ankle weights holding onto walker CGA 2x10 Nustep level 5 x 7 mins HS curls 45lbs 2x15 Leg extension 15lbs 2x15 Cable shoulder ext 10 lbs x10  02/21/23 Leg curls 45# 2x15 Leg extension 15# 3x10, 10# single legs x10 Leg press 60# 2x10, then single legs 20# 2x10 Nustep level 5 x 5 minues level 6 x 5 minutes 15# straight arm pulls Green tband clamshells 35# seated row 2x10 35# lats  2x10 Walking 100 feet with FWW  02/19/23 Nustep level 5 x 7 minutes Practiced stepping over tub to get in the shower, due to some fear and pain in the right hip he could not do this. Worked on problem solving this with him HS curls 45# 2x15 15# leg extension Leg press 60# x2 had some knee pain, 40# 2x15, then single legs 20# 10# straight arm pulls standing Gait with FWW 110 feet  02/14/23 Nustep level 5 x 10 mintues HS curls 45lb 2x15 Leg extension 15# 2x15 Seated row 35# 3x12 Lats 35# 2x10 Leg press 60# 3x10 Standing side step length of mat table  02/12/23 Gait outside out back door, down curb and then around to the front of the building, multiple standing rests due to back pain and fatigue Nustep level 5 x 10 mintues LEg curls 45# 2x10 Leg extension 15# 2x10 Seated row 35# 2x10 Lats 35# 2x10 Leg press 70# 3x10  02/07/23 Nustep level 5 x 7 minutes S2S from elevated surface 2x10  Side step length of mat table x2 Standing ball Toss  Gait 3 laps w/ RW ~320 feet one standing  rest Leg curls 45# 2x12 Leg extension 10# 2x12, SL 5lb 2x12 each  MHP Lumbar spine x10  min  02/05/23 Gait out the back door down a curb then around to the front of the building, we had to take 1 standing rest breaks and one seated rest mostly due to back tightness and L hip pain Nustep level 5 x 7 minutes Leg curls 45# 2x12 Leg extension 10# 2x12, SL 5lb 2x10 each  Standing shoulder Ext green 2x15   01/31/23 Nustep level 5 x 7 minutes Gait 3 laps ~ 320 feet indoors w/ RW some low back tightness after 210ft Volleyball in standing Leg curls 45# 3x10 Leg extension 10# 3x10, SL 5lb 2x10 each  Leg press 60lb 3x10  Standing shoulder Ext green 2x15   PATIENT EDUCATION: Education details: POC and HEP  Person educated: Patient and Spouse Education method: Explanation Education comprehension: verbalized understanding  HOME EXERCISE PROGRAM: Access Code: [email or web address] URL: [email or web address] Date: 11/13/2022 Prepared by: Cassie Freer  Exercises - Seated March  - 1 x daily - 7 x weekly - 2 sets - 10 reps - Seated Knee Extension with Anchored Resistance  - 1 x daily - 7 x weekly - 2 sets - 10 reps - Seated Hip Abduction with Resistance  - 1 x daily - 7 x weekly - 2 sets - 10 reps - Seated Hip Adduction Isometrics with Ball  - 1 x daily - 7 x weekly - 2 sets - 10 reps - Seated Shoulder Horizontal Abduction with Resistance  - 1 x daily - 7 x weekly - 2 sets - 10 reps  GOALS: Goals reviewed with patient? No  SHORT TERM GOALS: Target date: 12/25/22  Patient will be independent with initial HEP. Goal status: met 01/03/23  2.  Patient will demonstrate improved sitting posture in wheelchair Baseline: increased kyphosis and flexed trunk Goal status: 02/27/23- Can verbalize, met  3.  Patient will be modA with transfers Baseline: maxA Goal status: met 12/13/22   LONG TERM GOALS: Target date: 02/05/23  Patient will be independent with advanced/ongoing HEP to improve outcomes and carryover.  Goal status: progressing 02/21/23  2.  Patient will demonstrate improved  functional LE strength as demonstrated by 4 or better. Baseline: At least 3/5  Goal status: Progressing 02/27/23 03/13/23 quads B 4/5 L hip flex 4-/5, R hip  flex 4/5, Hip abd B 4/5  3.  Patient will be independent with sit to stand from wheelchair Baseline: maxA Goal status: Met 02/05/23 03/13/23:  able to stand to walker today from his wheelchair.   4.  Patient will be able to take at least 5 steps with rolling walker minA Baseline: per wife can take a few steps with mod A Goal status: met 01/01/23 03/13/23 states that he walks around the house with his walker frequently.  Only uses power wheelchair to go to appts.   ASSESSMENT:  CLINICAL IMPRESSION: Pt returned today with reports of ongoing L hip pain which is limiting his ability to participate in PT sessions and interfering with his everyday life, sleep, etc.  He does demonstrate much improved strength B LE's, improved I with transfers and mobility as compared to his initial evaluation in march.  We discussed placing on hold today due to his wish to be evaluated by orthopedist regarding L hip.  Will place on hold, DC if no further word.  OBJECTIVE IMPAIRMENTS: decreased activity tolerance, decreased coordination, decreased endurance, decreased knowledge of condition, decreased mobility, difficulty walking, decreased strength, improper body mechanics, postural dysfunction, and obesity.   ACTIVITY LIMITATIONS: sitting, standing, squatting, stairs, transfers, bed mobility, bathing, toileting, dressing, hygiene/grooming, and locomotion level  PERSONAL FACTORS: Age, Behavior pattern, Fitness, Past/current experiences, Time since onset of injury/illness/exacerbation, and 3+ comorbidities: hx of stroke, heart and kidney transplant, hx of falls, dementia, DM, obesity, ESRD, Afib  are also affecting patient's functional outcome.   REHAB POTENTIAL: Fair    CLINICAL DECISION MAKING: Evolving/moderate complexity  EVALUATION COMPLEXITY:  Moderate   PLAN:  PT FREQUENCY: 2x/week  PT DURATION: 12 weeks  PLANNED INTERVENTIONS: Therapeutic exercises, Therapeutic activity, Neuromuscular re-education, Balance training, Gait training, Patient/Family education, Self Care, Joint mobilization, Stair training, Wheelchair mobility training, Cryotherapy, Moist heat, and Manual therapy  PLAN FOR NEXT SESSION: Hold skilled PT at this time   Caralee Ates, PT, DPT OCS7/17/2024, 4:53 PM

## 2023-03-15 DIAGNOSIS — N2581 Secondary hyperparathyroidism of renal origin: Secondary | ICD-10-CM | POA: Diagnosis not present

## 2023-03-15 DIAGNOSIS — E1129 Type 2 diabetes mellitus with other diabetic kidney complication: Secondary | ICD-10-CM | POA: Diagnosis not present

## 2023-03-15 DIAGNOSIS — Z992 Dependence on renal dialysis: Secondary | ICD-10-CM | POA: Diagnosis not present

## 2023-03-15 DIAGNOSIS — D509 Iron deficiency anemia, unspecified: Secondary | ICD-10-CM | POA: Diagnosis not present

## 2023-03-15 DIAGNOSIS — N186 End stage renal disease: Secondary | ICD-10-CM | POA: Diagnosis not present

## 2023-03-15 DIAGNOSIS — D631 Anemia in chronic kidney disease: Secondary | ICD-10-CM | POA: Diagnosis not present

## 2023-03-18 DIAGNOSIS — N2581 Secondary hyperparathyroidism of renal origin: Secondary | ICD-10-CM | POA: Diagnosis not present

## 2023-03-18 DIAGNOSIS — N186 End stage renal disease: Secondary | ICD-10-CM | POA: Diagnosis not present

## 2023-03-18 DIAGNOSIS — E1129 Type 2 diabetes mellitus with other diabetic kidney complication: Secondary | ICD-10-CM | POA: Diagnosis not present

## 2023-03-18 DIAGNOSIS — Z992 Dependence on renal dialysis: Secondary | ICD-10-CM | POA: Diagnosis not present

## 2023-03-18 DIAGNOSIS — D509 Iron deficiency anemia, unspecified: Secondary | ICD-10-CM | POA: Diagnosis not present

## 2023-03-18 DIAGNOSIS — D631 Anemia in chronic kidney disease: Secondary | ICD-10-CM | POA: Diagnosis not present

## 2023-03-20 DIAGNOSIS — E6609 Other obesity due to excess calories: Secondary | ICD-10-CM | POA: Diagnosis not present

## 2023-03-20 DIAGNOSIS — M549 Dorsalgia, unspecified: Secondary | ICD-10-CM | POA: Diagnosis not present

## 2023-03-20 DIAGNOSIS — D631 Anemia in chronic kidney disease: Secondary | ICD-10-CM | POA: Diagnosis not present

## 2023-03-20 DIAGNOSIS — E538 Deficiency of other specified B group vitamins: Secondary | ICD-10-CM | POA: Diagnosis not present

## 2023-03-20 DIAGNOSIS — N186 End stage renal disease: Secondary | ICD-10-CM | POA: Diagnosis not present

## 2023-03-20 DIAGNOSIS — G8929 Other chronic pain: Secondary | ICD-10-CM | POA: Diagnosis not present

## 2023-03-20 DIAGNOSIS — Z6832 Body mass index (BMI) 32.0-32.9, adult: Secondary | ICD-10-CM | POA: Diagnosis not present

## 2023-03-20 DIAGNOSIS — D508 Other iron deficiency anemias: Secondary | ICD-10-CM | POA: Diagnosis not present

## 2023-03-20 DIAGNOSIS — D509 Iron deficiency anemia, unspecified: Secondary | ICD-10-CM | POA: Diagnosis not present

## 2023-03-20 DIAGNOSIS — G629 Polyneuropathy, unspecified: Secondary | ICD-10-CM | POA: Diagnosis not present

## 2023-03-20 DIAGNOSIS — N2581 Secondary hyperparathyroidism of renal origin: Secondary | ICD-10-CM | POA: Diagnosis not present

## 2023-03-20 DIAGNOSIS — F339 Major depressive disorder, recurrent, unspecified: Secondary | ICD-10-CM | POA: Diagnosis not present

## 2023-03-20 DIAGNOSIS — M5030 Other cervical disc degeneration, unspecified cervical region: Secondary | ICD-10-CM | POA: Diagnosis not present

## 2023-03-20 DIAGNOSIS — Z992 Dependence on renal dialysis: Secondary | ICD-10-CM | POA: Diagnosis not present

## 2023-03-20 DIAGNOSIS — E1129 Type 2 diabetes mellitus with other diabetic kidney complication: Secondary | ICD-10-CM | POA: Diagnosis not present

## 2023-03-20 DIAGNOSIS — M4802 Spinal stenosis, cervical region: Secondary | ICD-10-CM | POA: Diagnosis not present

## 2023-03-21 ENCOUNTER — Ambulatory Visit: Payer: Medicare Other | Admitting: Physical Therapy

## 2023-03-21 DIAGNOSIS — M79671 Pain in right foot: Secondary | ICD-10-CM | POA: Diagnosis not present

## 2023-03-22 DIAGNOSIS — N186 End stage renal disease: Secondary | ICD-10-CM | POA: Diagnosis not present

## 2023-03-22 DIAGNOSIS — N2581 Secondary hyperparathyroidism of renal origin: Secondary | ICD-10-CM | POA: Diagnosis not present

## 2023-03-22 DIAGNOSIS — Z992 Dependence on renal dialysis: Secondary | ICD-10-CM | POA: Diagnosis not present

## 2023-03-22 DIAGNOSIS — D631 Anemia in chronic kidney disease: Secondary | ICD-10-CM | POA: Diagnosis not present

## 2023-03-22 DIAGNOSIS — D509 Iron deficiency anemia, unspecified: Secondary | ICD-10-CM | POA: Diagnosis not present

## 2023-03-22 DIAGNOSIS — E1129 Type 2 diabetes mellitus with other diabetic kidney complication: Secondary | ICD-10-CM | POA: Diagnosis not present

## 2023-03-25 DIAGNOSIS — D631 Anemia in chronic kidney disease: Secondary | ICD-10-CM | POA: Diagnosis not present

## 2023-03-25 DIAGNOSIS — E1129 Type 2 diabetes mellitus with other diabetic kidney complication: Secondary | ICD-10-CM | POA: Diagnosis not present

## 2023-03-25 DIAGNOSIS — D509 Iron deficiency anemia, unspecified: Secondary | ICD-10-CM | POA: Diagnosis not present

## 2023-03-25 DIAGNOSIS — N2581 Secondary hyperparathyroidism of renal origin: Secondary | ICD-10-CM | POA: Diagnosis not present

## 2023-03-25 DIAGNOSIS — Z992 Dependence on renal dialysis: Secondary | ICD-10-CM | POA: Diagnosis not present

## 2023-03-25 DIAGNOSIS — N186 End stage renal disease: Secondary | ICD-10-CM | POA: Diagnosis not present

## 2023-03-27 DIAGNOSIS — E1129 Type 2 diabetes mellitus with other diabetic kidney complication: Secondary | ICD-10-CM | POA: Diagnosis not present

## 2023-03-27 DIAGNOSIS — Z992 Dependence on renal dialysis: Secondary | ICD-10-CM | POA: Diagnosis not present

## 2023-03-27 DIAGNOSIS — N186 End stage renal disease: Secondary | ICD-10-CM | POA: Diagnosis not present

## 2023-03-27 DIAGNOSIS — D509 Iron deficiency anemia, unspecified: Secondary | ICD-10-CM | POA: Diagnosis not present

## 2023-03-27 DIAGNOSIS — D631 Anemia in chronic kidney disease: Secondary | ICD-10-CM | POA: Diagnosis not present

## 2023-03-27 DIAGNOSIS — N2581 Secondary hyperparathyroidism of renal origin: Secondary | ICD-10-CM | POA: Diagnosis not present

## 2023-03-28 ENCOUNTER — Ambulatory Visit: Payer: Medicare Other | Admitting: Physical Therapy

## 2023-03-28 DIAGNOSIS — N186 End stage renal disease: Secondary | ICD-10-CM | POA: Diagnosis not present

## 2023-03-28 DIAGNOSIS — T8620 Unspecified complication of heart transplant: Secondary | ICD-10-CM | POA: Diagnosis not present

## 2023-03-28 DIAGNOSIS — Z992 Dependence on renal dialysis: Secondary | ICD-10-CM | POA: Diagnosis not present

## 2023-03-29 DIAGNOSIS — D509 Iron deficiency anemia, unspecified: Secondary | ICD-10-CM | POA: Diagnosis not present

## 2023-03-29 DIAGNOSIS — N2581 Secondary hyperparathyroidism of renal origin: Secondary | ICD-10-CM | POA: Diagnosis not present

## 2023-03-29 DIAGNOSIS — N186 End stage renal disease: Secondary | ICD-10-CM | POA: Diagnosis not present

## 2023-03-29 DIAGNOSIS — E1129 Type 2 diabetes mellitus with other diabetic kidney complication: Secondary | ICD-10-CM | POA: Diagnosis not present

## 2023-03-29 DIAGNOSIS — Z992 Dependence on renal dialysis: Secondary | ICD-10-CM | POA: Diagnosis not present

## 2023-03-29 DIAGNOSIS — D631 Anemia in chronic kidney disease: Secondary | ICD-10-CM | POA: Diagnosis not present

## 2023-04-01 DIAGNOSIS — Z992 Dependence on renal dialysis: Secondary | ICD-10-CM | POA: Diagnosis not present

## 2023-04-01 DIAGNOSIS — D631 Anemia in chronic kidney disease: Secondary | ICD-10-CM | POA: Diagnosis not present

## 2023-04-01 DIAGNOSIS — D509 Iron deficiency anemia, unspecified: Secondary | ICD-10-CM | POA: Diagnosis not present

## 2023-04-01 DIAGNOSIS — N186 End stage renal disease: Secondary | ICD-10-CM | POA: Diagnosis not present

## 2023-04-01 DIAGNOSIS — N2581 Secondary hyperparathyroidism of renal origin: Secondary | ICD-10-CM | POA: Diagnosis not present

## 2023-04-01 DIAGNOSIS — E1129 Type 2 diabetes mellitus with other diabetic kidney complication: Secondary | ICD-10-CM | POA: Diagnosis not present

## 2023-04-02 DIAGNOSIS — F331 Major depressive disorder, recurrent, moderate: Secondary | ICD-10-CM | POA: Diagnosis not present

## 2023-04-02 DIAGNOSIS — F6381 Intermittent explosive disorder: Secondary | ICD-10-CM | POA: Diagnosis not present

## 2023-04-02 DIAGNOSIS — F02818 Dementia in other diseases classified elsewhere, unspecified severity, with other behavioral disturbance: Secondary | ICD-10-CM | POA: Diagnosis not present

## 2023-04-02 DIAGNOSIS — F411 Generalized anxiety disorder: Secondary | ICD-10-CM | POA: Diagnosis not present

## 2023-04-03 DIAGNOSIS — D631 Anemia in chronic kidney disease: Secondary | ICD-10-CM | POA: Diagnosis not present

## 2023-04-03 DIAGNOSIS — E1129 Type 2 diabetes mellitus with other diabetic kidney complication: Secondary | ICD-10-CM | POA: Diagnosis not present

## 2023-04-03 DIAGNOSIS — Z992 Dependence on renal dialysis: Secondary | ICD-10-CM | POA: Diagnosis not present

## 2023-04-03 DIAGNOSIS — N2581 Secondary hyperparathyroidism of renal origin: Secondary | ICD-10-CM | POA: Diagnosis not present

## 2023-04-03 DIAGNOSIS — N186 End stage renal disease: Secondary | ICD-10-CM | POA: Diagnosis not present

## 2023-04-03 DIAGNOSIS — D509 Iron deficiency anemia, unspecified: Secondary | ICD-10-CM | POA: Diagnosis not present

## 2023-04-05 DIAGNOSIS — D631 Anemia in chronic kidney disease: Secondary | ICD-10-CM | POA: Diagnosis not present

## 2023-04-05 DIAGNOSIS — D509 Iron deficiency anemia, unspecified: Secondary | ICD-10-CM | POA: Diagnosis not present

## 2023-04-05 DIAGNOSIS — E13621 Other specified diabetes mellitus with foot ulcer: Secondary | ICD-10-CM | POA: Diagnosis not present

## 2023-04-05 DIAGNOSIS — E1129 Type 2 diabetes mellitus with other diabetic kidney complication: Secondary | ICD-10-CM | POA: Diagnosis not present

## 2023-04-05 DIAGNOSIS — Z992 Dependence on renal dialysis: Secondary | ICD-10-CM | POA: Diagnosis not present

## 2023-04-05 DIAGNOSIS — N2581 Secondary hyperparathyroidism of renal origin: Secondary | ICD-10-CM | POA: Diagnosis not present

## 2023-04-05 DIAGNOSIS — M14671 Charcot's joint, right ankle and foot: Secondary | ICD-10-CM | POA: Diagnosis not present

## 2023-04-05 DIAGNOSIS — N186 End stage renal disease: Secondary | ICD-10-CM | POA: Diagnosis not present

## 2023-04-08 DIAGNOSIS — N2581 Secondary hyperparathyroidism of renal origin: Secondary | ICD-10-CM | POA: Diagnosis not present

## 2023-04-08 DIAGNOSIS — D509 Iron deficiency anemia, unspecified: Secondary | ICD-10-CM | POA: Diagnosis not present

## 2023-04-08 DIAGNOSIS — Z992 Dependence on renal dialysis: Secondary | ICD-10-CM | POA: Diagnosis not present

## 2023-04-08 DIAGNOSIS — D631 Anemia in chronic kidney disease: Secondary | ICD-10-CM | POA: Diagnosis not present

## 2023-04-08 DIAGNOSIS — N186 End stage renal disease: Secondary | ICD-10-CM | POA: Diagnosis not present

## 2023-04-08 DIAGNOSIS — E1129 Type 2 diabetes mellitus with other diabetic kidney complication: Secondary | ICD-10-CM | POA: Diagnosis not present

## 2023-04-10 DIAGNOSIS — D509 Iron deficiency anemia, unspecified: Secondary | ICD-10-CM | POA: Diagnosis not present

## 2023-04-10 DIAGNOSIS — N2581 Secondary hyperparathyroidism of renal origin: Secondary | ICD-10-CM | POA: Diagnosis not present

## 2023-04-10 DIAGNOSIS — N186 End stage renal disease: Secondary | ICD-10-CM | POA: Diagnosis not present

## 2023-04-10 DIAGNOSIS — E1129 Type 2 diabetes mellitus with other diabetic kidney complication: Secondary | ICD-10-CM | POA: Diagnosis not present

## 2023-04-10 DIAGNOSIS — D631 Anemia in chronic kidney disease: Secondary | ICD-10-CM | POA: Diagnosis not present

## 2023-04-10 DIAGNOSIS — Z992 Dependence on renal dialysis: Secondary | ICD-10-CM | POA: Diagnosis not present

## 2023-04-11 DIAGNOSIS — M5416 Radiculopathy, lumbar region: Secondary | ICD-10-CM | POA: Diagnosis not present

## 2023-04-12 DIAGNOSIS — D509 Iron deficiency anemia, unspecified: Secondary | ICD-10-CM | POA: Diagnosis not present

## 2023-04-12 DIAGNOSIS — N186 End stage renal disease: Secondary | ICD-10-CM | POA: Diagnosis not present

## 2023-04-12 DIAGNOSIS — Z992 Dependence on renal dialysis: Secondary | ICD-10-CM | POA: Diagnosis not present

## 2023-04-12 DIAGNOSIS — N2581 Secondary hyperparathyroidism of renal origin: Secondary | ICD-10-CM | POA: Diagnosis not present

## 2023-04-12 DIAGNOSIS — D631 Anemia in chronic kidney disease: Secondary | ICD-10-CM | POA: Diagnosis not present

## 2023-04-12 DIAGNOSIS — E1129 Type 2 diabetes mellitus with other diabetic kidney complication: Secondary | ICD-10-CM | POA: Diagnosis not present

## 2023-04-15 DIAGNOSIS — D631 Anemia in chronic kidney disease: Secondary | ICD-10-CM | POA: Diagnosis not present

## 2023-04-15 DIAGNOSIS — M25552 Pain in left hip: Secondary | ICD-10-CM | POA: Diagnosis not present

## 2023-04-15 DIAGNOSIS — M1612 Unilateral primary osteoarthritis, left hip: Secondary | ICD-10-CM | POA: Diagnosis not present

## 2023-04-15 DIAGNOSIS — Z992 Dependence on renal dialysis: Secondary | ICD-10-CM | POA: Diagnosis not present

## 2023-04-15 DIAGNOSIS — D509 Iron deficiency anemia, unspecified: Secondary | ICD-10-CM | POA: Diagnosis not present

## 2023-04-15 DIAGNOSIS — N2581 Secondary hyperparathyroidism of renal origin: Secondary | ICD-10-CM | POA: Diagnosis not present

## 2023-04-15 DIAGNOSIS — E1129 Type 2 diabetes mellitus with other diabetic kidney complication: Secondary | ICD-10-CM | POA: Diagnosis not present

## 2023-04-15 DIAGNOSIS — M7062 Trochanteric bursitis, left hip: Secondary | ICD-10-CM | POA: Diagnosis not present

## 2023-04-15 DIAGNOSIS — N186 End stage renal disease: Secondary | ICD-10-CM | POA: Diagnosis not present

## 2023-04-17 DIAGNOSIS — E1129 Type 2 diabetes mellitus with other diabetic kidney complication: Secondary | ICD-10-CM | POA: Diagnosis not present

## 2023-04-17 DIAGNOSIS — N2581 Secondary hyperparathyroidism of renal origin: Secondary | ICD-10-CM | POA: Diagnosis not present

## 2023-04-17 DIAGNOSIS — N186 End stage renal disease: Secondary | ICD-10-CM | POA: Diagnosis not present

## 2023-04-17 DIAGNOSIS — D509 Iron deficiency anemia, unspecified: Secondary | ICD-10-CM | POA: Diagnosis not present

## 2023-04-17 DIAGNOSIS — Z992 Dependence on renal dialysis: Secondary | ICD-10-CM | POA: Diagnosis not present

## 2023-04-17 DIAGNOSIS — D631 Anemia in chronic kidney disease: Secondary | ICD-10-CM | POA: Diagnosis not present

## 2023-04-18 DIAGNOSIS — Z992 Dependence on renal dialysis: Secondary | ICD-10-CM | POA: Diagnosis not present

## 2023-04-18 DIAGNOSIS — I071 Rheumatic tricuspid insufficiency: Secondary | ICD-10-CM | POA: Diagnosis not present

## 2023-04-18 DIAGNOSIS — I1311 Hypertensive heart and chronic kidney disease without heart failure, with stage 5 chronic kidney disease, or end stage renal disease: Secondary | ICD-10-CM | POA: Diagnosis not present

## 2023-04-18 DIAGNOSIS — I371 Nonrheumatic pulmonary valve insufficiency: Secondary | ICD-10-CM | POA: Diagnosis not present

## 2023-04-18 DIAGNOSIS — D84821 Immunodeficiency due to drugs: Secondary | ICD-10-CM | POA: Diagnosis not present

## 2023-04-18 DIAGNOSIS — N186 End stage renal disease: Secondary | ICD-10-CM | POA: Diagnosis not present

## 2023-04-18 DIAGNOSIS — R296 Repeated falls: Secondary | ICD-10-CM | POA: Diagnosis not present

## 2023-04-18 DIAGNOSIS — R5383 Other fatigue: Secondary | ICD-10-CM | POA: Diagnosis not present

## 2023-04-18 DIAGNOSIS — Z2989 Encounter for other specified prophylactic measures: Secondary | ICD-10-CM | POA: Diagnosis not present

## 2023-04-18 DIAGNOSIS — Z79899 Other long term (current) drug therapy: Secondary | ICD-10-CM | POA: Diagnosis not present

## 2023-04-18 DIAGNOSIS — Z941 Heart transplant status: Secondary | ICD-10-CM | POA: Diagnosis not present

## 2023-04-18 DIAGNOSIS — Z48298 Encounter for aftercare following other organ transplant: Secondary | ICD-10-CM | POA: Diagnosis not present

## 2023-04-18 DIAGNOSIS — Z4821 Encounter for aftercare following heart transplant: Secondary | ICD-10-CM | POA: Diagnosis not present

## 2023-04-19 DIAGNOSIS — D631 Anemia in chronic kidney disease: Secondary | ICD-10-CM | POA: Diagnosis not present

## 2023-04-19 DIAGNOSIS — N2581 Secondary hyperparathyroidism of renal origin: Secondary | ICD-10-CM | POA: Diagnosis not present

## 2023-04-19 DIAGNOSIS — N186 End stage renal disease: Secondary | ICD-10-CM | POA: Diagnosis not present

## 2023-04-19 DIAGNOSIS — Z992 Dependence on renal dialysis: Secondary | ICD-10-CM | POA: Diagnosis not present

## 2023-04-19 DIAGNOSIS — E1129 Type 2 diabetes mellitus with other diabetic kidney complication: Secondary | ICD-10-CM | POA: Diagnosis not present

## 2023-04-19 DIAGNOSIS — D509 Iron deficiency anemia, unspecified: Secondary | ICD-10-CM | POA: Diagnosis not present

## 2023-04-22 DIAGNOSIS — N2581 Secondary hyperparathyroidism of renal origin: Secondary | ICD-10-CM | POA: Diagnosis not present

## 2023-04-22 DIAGNOSIS — N186 End stage renal disease: Secondary | ICD-10-CM | POA: Diagnosis not present

## 2023-04-22 DIAGNOSIS — Z992 Dependence on renal dialysis: Secondary | ICD-10-CM | POA: Diagnosis not present

## 2023-04-22 DIAGNOSIS — D509 Iron deficiency anemia, unspecified: Secondary | ICD-10-CM | POA: Diagnosis not present

## 2023-04-22 DIAGNOSIS — D631 Anemia in chronic kidney disease: Secondary | ICD-10-CM | POA: Diagnosis not present

## 2023-04-22 DIAGNOSIS — E1129 Type 2 diabetes mellitus with other diabetic kidney complication: Secondary | ICD-10-CM | POA: Diagnosis not present

## 2023-04-23 DIAGNOSIS — Z48298 Encounter for aftercare following other organ transplant: Secondary | ICD-10-CM | POA: Diagnosis not present

## 2023-04-23 DIAGNOSIS — Z79899 Other long term (current) drug therapy: Secondary | ICD-10-CM | POA: Diagnosis not present

## 2023-04-23 DIAGNOSIS — D849 Immunodeficiency, unspecified: Secondary | ICD-10-CM | POA: Diagnosis not present

## 2023-04-23 DIAGNOSIS — Z941 Heart transplant status: Secondary | ICD-10-CM | POA: Diagnosis not present

## 2023-04-23 DIAGNOSIS — Z2989 Encounter for other specified prophylactic measures: Secondary | ICD-10-CM | POA: Diagnosis not present

## 2023-04-24 DIAGNOSIS — D509 Iron deficiency anemia, unspecified: Secondary | ICD-10-CM | POA: Diagnosis not present

## 2023-04-24 DIAGNOSIS — N186 End stage renal disease: Secondary | ICD-10-CM | POA: Diagnosis not present

## 2023-04-24 DIAGNOSIS — E1129 Type 2 diabetes mellitus with other diabetic kidney complication: Secondary | ICD-10-CM | POA: Diagnosis not present

## 2023-04-24 DIAGNOSIS — Z992 Dependence on renal dialysis: Secondary | ICD-10-CM | POA: Diagnosis not present

## 2023-04-24 DIAGNOSIS — N2581 Secondary hyperparathyroidism of renal origin: Secondary | ICD-10-CM | POA: Diagnosis not present

## 2023-04-24 DIAGNOSIS — D631 Anemia in chronic kidney disease: Secondary | ICD-10-CM | POA: Diagnosis not present

## 2023-04-25 DIAGNOSIS — G8929 Other chronic pain: Secondary | ICD-10-CM | POA: Diagnosis not present

## 2023-04-25 DIAGNOSIS — Z79899 Other long term (current) drug therapy: Secondary | ICD-10-CM | POA: Diagnosis not present

## 2023-04-25 DIAGNOSIS — D508 Other iron deficiency anemias: Secondary | ICD-10-CM | POA: Diagnosis not present

## 2023-04-25 DIAGNOSIS — M4802 Spinal stenosis, cervical region: Secondary | ICD-10-CM | POA: Diagnosis not present

## 2023-04-25 DIAGNOSIS — E538 Deficiency of other specified B group vitamins: Secondary | ICD-10-CM | POA: Diagnosis not present

## 2023-04-25 DIAGNOSIS — N186 End stage renal disease: Secondary | ICD-10-CM | POA: Diagnosis not present

## 2023-04-25 DIAGNOSIS — Z6832 Body mass index (BMI) 32.0-32.9, adult: Secondary | ICD-10-CM | POA: Diagnosis not present

## 2023-04-25 DIAGNOSIS — F339 Major depressive disorder, recurrent, unspecified: Secondary | ICD-10-CM | POA: Diagnosis not present

## 2023-04-25 DIAGNOSIS — M549 Dorsalgia, unspecified: Secondary | ICD-10-CM | POA: Diagnosis not present

## 2023-04-25 DIAGNOSIS — M5030 Other cervical disc degeneration, unspecified cervical region: Secondary | ICD-10-CM | POA: Diagnosis not present

## 2023-04-25 DIAGNOSIS — Z992 Dependence on renal dialysis: Secondary | ICD-10-CM | POA: Diagnosis not present

## 2023-04-25 DIAGNOSIS — G629 Polyneuropathy, unspecified: Secondary | ICD-10-CM | POA: Diagnosis not present

## 2023-04-25 DIAGNOSIS — E6609 Other obesity due to excess calories: Secondary | ICD-10-CM | POA: Diagnosis not present

## 2023-04-26 DIAGNOSIS — N2581 Secondary hyperparathyroidism of renal origin: Secondary | ICD-10-CM | POA: Diagnosis not present

## 2023-04-26 DIAGNOSIS — Z992 Dependence on renal dialysis: Secondary | ICD-10-CM | POA: Diagnosis not present

## 2023-04-26 DIAGNOSIS — E1129 Type 2 diabetes mellitus with other diabetic kidney complication: Secondary | ICD-10-CM | POA: Diagnosis not present

## 2023-04-26 DIAGNOSIS — N186 End stage renal disease: Secondary | ICD-10-CM | POA: Diagnosis not present

## 2023-04-26 DIAGNOSIS — D631 Anemia in chronic kidney disease: Secondary | ICD-10-CM | POA: Diagnosis not present

## 2023-04-26 DIAGNOSIS — D509 Iron deficiency anemia, unspecified: Secondary | ICD-10-CM | POA: Diagnosis not present

## 2023-04-28 DIAGNOSIS — T8620 Unspecified complication of heart transplant: Secondary | ICD-10-CM | POA: Diagnosis not present

## 2023-04-28 DIAGNOSIS — Z992 Dependence on renal dialysis: Secondary | ICD-10-CM | POA: Diagnosis not present

## 2023-04-28 DIAGNOSIS — N186 End stage renal disease: Secondary | ICD-10-CM | POA: Diagnosis not present

## 2023-04-29 DIAGNOSIS — E1129 Type 2 diabetes mellitus with other diabetic kidney complication: Secondary | ICD-10-CM | POA: Diagnosis not present

## 2023-04-29 DIAGNOSIS — D509 Iron deficiency anemia, unspecified: Secondary | ICD-10-CM | POA: Diagnosis not present

## 2023-04-29 DIAGNOSIS — Z992 Dependence on renal dialysis: Secondary | ICD-10-CM | POA: Diagnosis not present

## 2023-04-29 DIAGNOSIS — N2581 Secondary hyperparathyroidism of renal origin: Secondary | ICD-10-CM | POA: Diagnosis not present

## 2023-04-29 DIAGNOSIS — N186 End stage renal disease: Secondary | ICD-10-CM | POA: Diagnosis not present

## 2023-04-29 DIAGNOSIS — D631 Anemia in chronic kidney disease: Secondary | ICD-10-CM | POA: Diagnosis not present

## 2023-05-01 DIAGNOSIS — D509 Iron deficiency anemia, unspecified: Secondary | ICD-10-CM | POA: Diagnosis not present

## 2023-05-01 DIAGNOSIS — D631 Anemia in chronic kidney disease: Secondary | ICD-10-CM | POA: Diagnosis not present

## 2023-05-01 DIAGNOSIS — N186 End stage renal disease: Secondary | ICD-10-CM | POA: Diagnosis not present

## 2023-05-01 DIAGNOSIS — E1129 Type 2 diabetes mellitus with other diabetic kidney complication: Secondary | ICD-10-CM | POA: Diagnosis not present

## 2023-05-01 DIAGNOSIS — Z992 Dependence on renal dialysis: Secondary | ICD-10-CM | POA: Diagnosis not present

## 2023-05-01 DIAGNOSIS — N2581 Secondary hyperparathyroidism of renal origin: Secondary | ICD-10-CM | POA: Diagnosis not present

## 2023-05-03 DIAGNOSIS — D631 Anemia in chronic kidney disease: Secondary | ICD-10-CM | POA: Diagnosis not present

## 2023-05-03 DIAGNOSIS — N186 End stage renal disease: Secondary | ICD-10-CM | POA: Diagnosis not present

## 2023-05-03 DIAGNOSIS — Z992 Dependence on renal dialysis: Secondary | ICD-10-CM | POA: Diagnosis not present

## 2023-05-03 DIAGNOSIS — N2581 Secondary hyperparathyroidism of renal origin: Secondary | ICD-10-CM | POA: Diagnosis not present

## 2023-05-03 DIAGNOSIS — D509 Iron deficiency anemia, unspecified: Secondary | ICD-10-CM | POA: Diagnosis not present

## 2023-05-03 DIAGNOSIS — E1129 Type 2 diabetes mellitus with other diabetic kidney complication: Secondary | ICD-10-CM | POA: Diagnosis not present

## 2023-05-06 DIAGNOSIS — Z79899 Other long term (current) drug therapy: Secondary | ICD-10-CM | POA: Diagnosis not present

## 2023-05-06 DIAGNOSIS — Z48298 Encounter for aftercare following other organ transplant: Secondary | ICD-10-CM | POA: Diagnosis not present

## 2023-05-06 DIAGNOSIS — E1129 Type 2 diabetes mellitus with other diabetic kidney complication: Secondary | ICD-10-CM | POA: Diagnosis not present

## 2023-05-06 DIAGNOSIS — Z992 Dependence on renal dialysis: Secondary | ICD-10-CM | POA: Diagnosis not present

## 2023-05-06 DIAGNOSIS — D631 Anemia in chronic kidney disease: Secondary | ICD-10-CM | POA: Diagnosis not present

## 2023-05-06 DIAGNOSIS — D849 Immunodeficiency, unspecified: Secondary | ICD-10-CM | POA: Diagnosis not present

## 2023-05-06 DIAGNOSIS — N2581 Secondary hyperparathyroidism of renal origin: Secondary | ICD-10-CM | POA: Diagnosis not present

## 2023-05-06 DIAGNOSIS — D509 Iron deficiency anemia, unspecified: Secondary | ICD-10-CM | POA: Diagnosis not present

## 2023-05-06 DIAGNOSIS — Z941 Heart transplant status: Secondary | ICD-10-CM | POA: Diagnosis not present

## 2023-05-06 DIAGNOSIS — N186 End stage renal disease: Secondary | ICD-10-CM | POA: Diagnosis not present

## 2023-05-06 DIAGNOSIS — Z2989 Encounter for other specified prophylactic measures: Secondary | ICD-10-CM | POA: Diagnosis not present

## 2023-05-07 DIAGNOSIS — F339 Major depressive disorder, recurrent, unspecified: Secondary | ICD-10-CM | POA: Diagnosis not present

## 2023-05-07 DIAGNOSIS — M549 Dorsalgia, unspecified: Secondary | ICD-10-CM | POA: Diagnosis not present

## 2023-05-07 DIAGNOSIS — E6609 Other obesity due to excess calories: Secondary | ICD-10-CM | POA: Diagnosis not present

## 2023-05-07 DIAGNOSIS — Z6832 Body mass index (BMI) 32.0-32.9, adult: Secondary | ICD-10-CM | POA: Diagnosis not present

## 2023-05-07 DIAGNOSIS — N186 End stage renal disease: Secondary | ICD-10-CM | POA: Diagnosis not present

## 2023-05-07 DIAGNOSIS — G8929 Other chronic pain: Secondary | ICD-10-CM | POA: Diagnosis not present

## 2023-05-07 DIAGNOSIS — M5030 Other cervical disc degeneration, unspecified cervical region: Secondary | ICD-10-CM | POA: Diagnosis not present

## 2023-05-07 DIAGNOSIS — G629 Polyneuropathy, unspecified: Secondary | ICD-10-CM | POA: Diagnosis not present

## 2023-05-07 DIAGNOSIS — M4802 Spinal stenosis, cervical region: Secondary | ICD-10-CM | POA: Diagnosis not present

## 2023-05-07 DIAGNOSIS — D508 Other iron deficiency anemias: Secondary | ICD-10-CM | POA: Diagnosis not present

## 2023-05-07 DIAGNOSIS — Z992 Dependence on renal dialysis: Secondary | ICD-10-CM | POA: Diagnosis not present

## 2023-05-07 DIAGNOSIS — Z79899 Other long term (current) drug therapy: Secondary | ICD-10-CM | POA: Diagnosis not present

## 2023-05-07 DIAGNOSIS — E538 Deficiency of other specified B group vitamins: Secondary | ICD-10-CM | POA: Diagnosis not present

## 2023-05-08 DIAGNOSIS — N186 End stage renal disease: Secondary | ICD-10-CM | POA: Diagnosis not present

## 2023-05-08 DIAGNOSIS — E1129 Type 2 diabetes mellitus with other diabetic kidney complication: Secondary | ICD-10-CM | POA: Diagnosis not present

## 2023-05-08 DIAGNOSIS — D631 Anemia in chronic kidney disease: Secondary | ICD-10-CM | POA: Diagnosis not present

## 2023-05-08 DIAGNOSIS — D509 Iron deficiency anemia, unspecified: Secondary | ICD-10-CM | POA: Diagnosis not present

## 2023-05-08 DIAGNOSIS — N2581 Secondary hyperparathyroidism of renal origin: Secondary | ICD-10-CM | POA: Diagnosis not present

## 2023-05-08 DIAGNOSIS — Z992 Dependence on renal dialysis: Secondary | ICD-10-CM | POA: Diagnosis not present

## 2023-05-09 ENCOUNTER — Encounter: Payer: Self-pay | Admitting: Vascular Surgery

## 2023-05-09 ENCOUNTER — Ambulatory Visit (INDEPENDENT_AMBULATORY_CARE_PROVIDER_SITE_OTHER): Payer: Medicare Other | Admitting: Vascular Surgery

## 2023-05-09 VITALS — BP 86/58 | HR 88 | Temp 98.0°F | Resp 20

## 2023-05-09 DIAGNOSIS — N186 End stage renal disease: Secondary | ICD-10-CM | POA: Diagnosis not present

## 2023-05-09 NOTE — Progress Notes (Signed)
Patient ID: [name], male   DOB: 1961/07/31, 62 y.o.   MRN: [id number]  Reason for Consult: New Patient (Initial Visit)   Referred by Bufford Buttner, MD  Subjective:     HPI:  [name] is a 62 y.o. male history of end-stage renal disease on dialysis Mondays, Wednesdays and Fridays via right arm fistula.  He states that he has had this present for approximately 8 years.  He has had revisions in the past he is not aware of any stents that have been placed.  He has had ulceration over this fistula for several months states that it is somewhat healing but continues to bleed with each dialysis session and he still having some oozing from yesterday's session.  He has not required any emergent operations for bleeding from the fistula.  He does not take any blood thinners.  Past Medical History:  Diagnosis Date   Arthritis    Atrial fibrillation (HCC)    CHF (congestive heart failure), NYHA class III (HCC)    s/p heart transplant   Chronic kidney disease    end stage   Chronic systolic dysfunction of left ventricle    CVA (cerebral infarction)    Diabetes mellitus    PMH; Prior to heart transplant   Family history of coronary artery disease    in both parents   GERD (gastroesophageal reflux disease)    Hyperlipidemia    Hypertension    Left bundle branch block    Morbid obesity (HCC)    status post lap band   Myocardial infarction [facility])    prior to heart transplant   Nonischemic cardiomyopathy (HCC)    prior to heart transplant   Obesity (BMI 30-39.9)    Obstructive sleep apnea    no longer needs CPAP after heart transplant per pt   Premature ventricular contractions    Prostate cancer (HCC)    SOB (shortness of breath)    Family History  Problem Relation Age of Onset   Coronary artery disease Father        had, PTCA & CABG   Heart attack Father    Hypertension Father    Diabetes Father    Prostate cancer Father    Coronary artery disease Mother    Heart  attack Mother    Hypertension Mother    Stroke Mother    Lupus Mother    Prostate cancer Brother    Coronary artery disease Brother    Coronary artery disease Sister    Heart attack Sister    Prostate cancer Paternal Uncle    Coronary artery disease Maternal Grandmother    Coronary artery disease Maternal Grandfather    Coronary artery disease Paternal Grandmother    Coronary artery disease Paternal Grandfather    Past Surgical History:  Procedure Laterality Date   CARDIAC PACEMAKER PLACEMENT  09/21/2009   Biventricular implantable cardioverter-defibrillator implantation      COLONOSCOPY W/ BIOPSIES AND POLYPECTOMY     CYSTOSCOPY WITH FULGERATION N/A 04/27/2021   Procedure: CYSTOSCOPY AND CLOT EVACUATION WITH BLADDER BIOPSY/ FUGARATION OF BLADDER/ FULGARATION OF PROSTATE ;  Surgeon: Jerilee Field, MD;  Location: [facility];  Service: Urology;  Laterality: N/A;   FOOT SURGERY     left   HEART TRANSPLANT  2014   LAPAROSCOPIC GASTRIC BANDING  01/27/2007   LEFT VENTRICULAR ASSIST DEVICE     implanted at [facility]   PACEMAKER REMOVAL     PROSTATE BIOPSY     TOTAL  HIP ARTHROPLASTY Right 07/22/2017   Procedure: RIGHT TOTAL HIP ARTHROPLASTY ANTERIOR APPROACH;  Surgeon: Samson Frederic, MD;  Location: [facility];  Service: Orthopedics;  Laterality: Right;  Needs RNFA   TOTAL KNEE ARTHROPLASTY Right 07/22/2017    Short Social History:  Social History   Tobacco Use   Smoking status: Never   Smokeless tobacco: Former    Types: Snuff  Substance Use Topics   Alcohol use: No    Allergies  Allergen Reactions   Heparin Nausea Only, Swelling and Other (See Comments)    * * HIT * *  SWELLING REACTION UNSPECIFIED   DIAPHORESIS    Iodinated Contrast Media    Penicillin G Potassium [Penicillin G] Nausea Only and Other (See Comments)    DIAPHORESIS    Current Outpatient Medications  Medication Sig Dispense Refill   acetaminophen (TYLENOL) 500 MG tablet Take 1,000 mg by mouth in the morning  and at bedtime.     cinacalcet (SENSIPAR) 60 MG tablet Take 60 mg by mouth daily.     cycloSPORINE modified (NEORAL) 25 MG capsule Take 75-100 mg by mouth 2 (two) times daily. Take 100 mg by mouth in the morning and 75 mg in the evening     divalproex (DEPAKOTE) 125 MG DR tablet Take 250 mg by mouth in morning and 500 mg by mouth at night 180 tablet 11   DULoxetine (CYMBALTA) 60 MG capsule Take 1 capsule (60 mg total) by mouth daily. 90 capsule 3   ELIQUIS 2.5 MG TABS tablet Take 1 tablet (2.5 mg total) by mouth 2 (two) times daily. 60 tablet    hydrocortisone (ANUSOL-HC) 2.5 % rectal cream Apply 1 Application topically as needed for hemorrhoids or anal itching.     ibuprofen (ADVIL) 200 MG tablet Take 400 mg by mouth as needed for moderate pain.     lidocaine (LIDODERM) 5 % Place 1 patch onto the skin daily. Remove & Discard patch within 12 hours or as directed by MD (Patient taking differently: Place 1 patch onto the skin as needed (pain). Remove & Discard patch within 12 hours or as directed by MD) 12 patch 0   lidocaine (XYLOCAINE) 5 % ointment Apply 1 application topically as needed. (Patient taking differently: Apply 1 application  topically as needed for moderate pain.) 35.44 g 0   midodrine (PROAMATINE) 10 MG tablet Take 1 tablet (10 mg total) by mouth 3 (three) times daily.     Multiple Vitamins-Minerals (MULTIVITAMIN WITH MINERALS) tablet Take 1 tablet by mouth daily.     mycophenolate (MYFORTIC) 180 MG EC tablet Take 720 mg by mouth 2 (two) times daily.     ondansetron (ZOFRAN) 4 MG tablet Take 1 tablet (4 mg total) by mouth every 6 (six) hours as needed for nausea. (Patient taking differently: Take 4 mg by mouth as needed for nausea.) 20 tablet 0   Oxycodone HCl 10 MG TABS Take 5-10 mg by mouth in the morning and at bedtime.     pregabalin (LYRICA) 50 MG capsule Take 50 mg by mouth 2 (two) times daily.     rosuvastatin (CRESTOR) 10 MG tablet Take 1 tablet (10 mg total) by mouth daily. 30  tablet 1   sevelamer carbonate (RENVELA) 800 MG tablet Take 2 tablets (1,600 mg total) by mouth 3 (three) times daily with meals. (Patient taking differently: Take 3,200 mg by mouth in the morning and at bedtime.) 120 tablet 0   sucroferric oxyhydroxide (VELPHORO) 500 MG chewable tablet Chew 500  mg by mouth in the morning and at bedtime.     traZODone (DESYREL) 100 MG tablet Take 100 mg by mouth at bedtime.     No current facility-administered medications for this visit.    Review of Systems  Constitutional:  Constitutional negative. HENT: HENT negative.  Eyes: Eyes negative.  Respiratory: Respiratory negative.  Cardiovascular: Positive for leg swelling.  GI: Gastrointestinal negative.  Musculoskeletal: Musculoskeletal negative.  Skin: Positive for rash.  Neurological: Positive for focal weakness.  Hematologic: Hematologic/lymphatic negative.  Psychiatric: Psychiatric negative.        Objective:  Objective   Vitals:   05/09/23 0819  BP: (!) 86/58  Pulse: 88  Resp: 20  Temp: 98 F (36.7 C)  SpO2: 92%   There is no height or weight on file to calculate BMI.  Physical Exam HENT:     Head: Normocephalic.     Nose: Nose normal.  Eyes:     Pupils: Pupils are equal, round, and reactive to light.  Cardiovascular:     Rate and Rhythm: Normal rate.     Pulses:          Radial pulses are 2+ on the right side.  Pulmonary:     Effort: Pulmonary effort is normal.  Abdominal:     General: Abdomen is flat.  Musculoskeletal:     Right lower leg: Edema present.     Left lower leg: Edema present.  Neurological:     General: No focal deficit present.     Mental Status: He is alert.  Psychiatric:        Mood and Affect: Mood normal.        Thought Content: Thought content normal.        Judgment: Judgment normal.         Assessment/Plan:     62 year old male on dialysis via right forearm AV fistula.  His fistula appears to have some previous revisions with maybe a jump  graft up to the cephalic vein.  There is a thrill in the cephalic vein more cephalad above the antecubitum.  I discussed with the patient and his wife that this fistula may be nearing its end but we will do everything reasonably possible to maintain access in the forearm although he may require a catheter for some period of time.  We will begin with fistulogram to get the lay of the land and can plan for either interposition grafting versus pseudoaneurysm revision versus conversion upper arm fistula versus graft on a nondialysis day pending fistulogram results.  They understand that this may be performed by one of my partners given that he is a Monday Wednesday Friday dialysis patient     Maeola Harman MD [facility]

## 2023-05-10 DIAGNOSIS — N2581 Secondary hyperparathyroidism of renal origin: Secondary | ICD-10-CM | POA: Diagnosis not present

## 2023-05-10 DIAGNOSIS — E1129 Type 2 diabetes mellitus with other diabetic kidney complication: Secondary | ICD-10-CM | POA: Diagnosis not present

## 2023-05-10 DIAGNOSIS — D631 Anemia in chronic kidney disease: Secondary | ICD-10-CM | POA: Diagnosis not present

## 2023-05-10 DIAGNOSIS — N186 End stage renal disease: Secondary | ICD-10-CM | POA: Diagnosis not present

## 2023-05-10 DIAGNOSIS — D509 Iron deficiency anemia, unspecified: Secondary | ICD-10-CM | POA: Diagnosis not present

## 2023-05-10 DIAGNOSIS — Z992 Dependence on renal dialysis: Secondary | ICD-10-CM | POA: Diagnosis not present

## 2023-05-13 DIAGNOSIS — Z992 Dependence on renal dialysis: Secondary | ICD-10-CM | POA: Diagnosis not present

## 2023-05-13 DIAGNOSIS — R5383 Other fatigue: Secondary | ICD-10-CM | POA: Diagnosis not present

## 2023-05-13 DIAGNOSIS — N186 End stage renal disease: Secondary | ICD-10-CM | POA: Diagnosis not present

## 2023-05-13 DIAGNOSIS — E1129 Type 2 diabetes mellitus with other diabetic kidney complication: Secondary | ICD-10-CM | POA: Diagnosis not present

## 2023-05-13 DIAGNOSIS — N2581 Secondary hyperparathyroidism of renal origin: Secondary | ICD-10-CM | POA: Diagnosis not present

## 2023-05-13 DIAGNOSIS — E559 Vitamin D deficiency, unspecified: Secondary | ICD-10-CM | POA: Diagnosis not present

## 2023-05-13 DIAGNOSIS — D509 Iron deficiency anemia, unspecified: Secondary | ICD-10-CM | POA: Diagnosis not present

## 2023-05-13 DIAGNOSIS — D631 Anemia in chronic kidney disease: Secondary | ICD-10-CM | POA: Diagnosis not present

## 2023-05-13 DIAGNOSIS — I779 Disorder of arteries and arterioles, unspecified: Secondary | ICD-10-CM | POA: Diagnosis not present

## 2023-05-14 ENCOUNTER — Telehealth: Payer: Self-pay

## 2023-05-14 NOTE — Telephone Encounter (Signed)
Attempted to reach pt to schedule his fistulogram. Left VM on both numbers listed, asking for him to return our call.

## 2023-05-15 ENCOUNTER — Other Ambulatory Visit: Payer: Self-pay

## 2023-05-15 DIAGNOSIS — N2581 Secondary hyperparathyroidism of renal origin: Secondary | ICD-10-CM | POA: Diagnosis not present

## 2023-05-15 DIAGNOSIS — E1129 Type 2 diabetes mellitus with other diabetic kidney complication: Secondary | ICD-10-CM | POA: Diagnosis not present

## 2023-05-15 DIAGNOSIS — N186 End stage renal disease: Secondary | ICD-10-CM

## 2023-05-15 DIAGNOSIS — D631 Anemia in chronic kidney disease: Secondary | ICD-10-CM | POA: Diagnosis not present

## 2023-05-15 DIAGNOSIS — D509 Iron deficiency anemia, unspecified: Secondary | ICD-10-CM | POA: Diagnosis not present

## 2023-05-15 DIAGNOSIS — Z992 Dependence on renal dialysis: Secondary | ICD-10-CM | POA: Diagnosis not present

## 2023-05-15 MED ORDER — SODIUM CHLORIDE 0.9% FLUSH: 3.0000 mL | Freq: Two times a day (BID) | INTRAVENOUS | Status: DC

## 2023-05-15 MED ORDER — SODIUM CHLORIDE 0.9 % IV SOLN: 250.0000 mL | INTRAVENOUS | Status: DC | PRN

## 2023-05-17 DIAGNOSIS — D631 Anemia in chronic kidney disease: Secondary | ICD-10-CM | POA: Diagnosis not present

## 2023-05-17 DIAGNOSIS — D509 Iron deficiency anemia, unspecified: Secondary | ICD-10-CM | POA: Diagnosis not present

## 2023-05-17 DIAGNOSIS — E1129 Type 2 diabetes mellitus with other diabetic kidney complication: Secondary | ICD-10-CM | POA: Diagnosis not present

## 2023-05-17 DIAGNOSIS — N2581 Secondary hyperparathyroidism of renal origin: Secondary | ICD-10-CM | POA: Diagnosis not present

## 2023-05-17 DIAGNOSIS — N186 End stage renal disease: Secondary | ICD-10-CM | POA: Diagnosis not present

## 2023-05-17 DIAGNOSIS — Z992 Dependence on renal dialysis: Secondary | ICD-10-CM | POA: Diagnosis not present

## 2023-05-20 DIAGNOSIS — Z992 Dependence on renal dialysis: Secondary | ICD-10-CM | POA: Diagnosis not present

## 2023-05-20 DIAGNOSIS — D509 Iron deficiency anemia, unspecified: Secondary | ICD-10-CM | POA: Diagnosis not present

## 2023-05-20 DIAGNOSIS — E1129 Type 2 diabetes mellitus with other diabetic kidney complication: Secondary | ICD-10-CM | POA: Diagnosis not present

## 2023-05-20 DIAGNOSIS — N2581 Secondary hyperparathyroidism of renal origin: Secondary | ICD-10-CM | POA: Diagnosis not present

## 2023-05-20 DIAGNOSIS — N186 End stage renal disease: Secondary | ICD-10-CM | POA: Diagnosis not present

## 2023-05-20 DIAGNOSIS — D631 Anemia in chronic kidney disease: Secondary | ICD-10-CM | POA: Diagnosis not present

## 2023-05-21 DIAGNOSIS — G20C Parkinsonism, unspecified: Secondary | ICD-10-CM | POA: Diagnosis not present

## 2023-05-21 DIAGNOSIS — D849 Immunodeficiency, unspecified: Secondary | ICD-10-CM | POA: Diagnosis not present

## 2023-05-21 DIAGNOSIS — Z941 Heart transplant status: Secondary | ICD-10-CM | POA: Diagnosis not present

## 2023-05-21 DIAGNOSIS — R413 Other amnesia: Secondary | ICD-10-CM | POA: Diagnosis not present

## 2023-05-21 DIAGNOSIS — Z2989 Encounter for other specified prophylactic measures: Secondary | ICD-10-CM | POA: Diagnosis not present

## 2023-05-21 DIAGNOSIS — Z48298 Encounter for aftercare following other organ transplant: Secondary | ICD-10-CM | POA: Diagnosis not present

## 2023-05-21 DIAGNOSIS — Z79899 Other long term (current) drug therapy: Secondary | ICD-10-CM | POA: Diagnosis not present

## 2023-05-21 DIAGNOSIS — R4189 Other symptoms and signs involving cognitive functions and awareness: Secondary | ICD-10-CM | POA: Diagnosis not present

## 2023-05-22 DIAGNOSIS — N2581 Secondary hyperparathyroidism of renal origin: Secondary | ICD-10-CM | POA: Diagnosis not present

## 2023-05-22 DIAGNOSIS — E1129 Type 2 diabetes mellitus with other diabetic kidney complication: Secondary | ICD-10-CM | POA: Diagnosis not present

## 2023-05-22 DIAGNOSIS — Z992 Dependence on renal dialysis: Secondary | ICD-10-CM | POA: Diagnosis not present

## 2023-05-22 DIAGNOSIS — N186 End stage renal disease: Secondary | ICD-10-CM | POA: Diagnosis not present

## 2023-05-22 DIAGNOSIS — D509 Iron deficiency anemia, unspecified: Secondary | ICD-10-CM | POA: Diagnosis not present

## 2023-05-22 DIAGNOSIS — D631 Anemia in chronic kidney disease: Secondary | ICD-10-CM | POA: Diagnosis not present

## 2023-05-23 ENCOUNTER — Ambulatory Visit (HOSPITAL_COMMUNITY)
Admission: RE | Admit: 2023-05-23 | Discharge: 2023-05-23 | Disposition: A | Discharge status: Home / Self Care | Payer: Medicare Other | Attending: Vascular Surgery | Admitting: Vascular Surgery

## 2023-05-23 ENCOUNTER — Other Ambulatory Visit: Payer: Self-pay

## 2023-05-23 ENCOUNTER — Encounter (HOSPITAL_COMMUNITY)
Admission: RE | Disposition: A | Discharge status: Home / Self Care | Payer: Self-pay | Source: Home / Self Care | Attending: Vascular Surgery

## 2023-05-23 DIAGNOSIS — Z992 Dependence on renal dialysis: Secondary | ICD-10-CM

## 2023-05-23 DIAGNOSIS — E1122 Type 2 diabetes mellitus with diabetic chronic kidney disease: Secondary | ICD-10-CM | POA: Diagnosis not present

## 2023-05-23 DIAGNOSIS — T82838A Hemorrhage of vascular prosthetic devices, implants and grafts, initial encounter: Secondary | ICD-10-CM

## 2023-05-23 DIAGNOSIS — Y841 Kidney dialysis as the cause of abnormal reaction of the patient, or of later complication, without mention of misadventure at the time of the procedure: Secondary | ICD-10-CM | POA: Insufficient documentation

## 2023-05-23 DIAGNOSIS — N186 End stage renal disease: Secondary | ICD-10-CM | POA: Diagnosis not present

## 2023-05-23 DIAGNOSIS — I120 Hypertensive chronic kidney disease with stage 5 chronic kidney disease or end stage renal disease: Secondary | ICD-10-CM | POA: Insufficient documentation

## 2023-05-23 DIAGNOSIS — T82858A Stenosis of vascular prosthetic devices, implants and grafts, initial encounter: Secondary | ICD-10-CM | POA: Diagnosis not present

## 2023-05-23 DIAGNOSIS — T82898A Other specified complication of vascular prosthetic devices, implants and grafts, initial encounter: Secondary | ICD-10-CM

## 2023-05-23 DIAGNOSIS — Z87891 Personal history of nicotine dependence: Secondary | ICD-10-CM | POA: Diagnosis not present

## 2023-05-23 DIAGNOSIS — N185 Chronic kidney disease, stage 5: Secondary | ICD-10-CM | POA: Diagnosis not present

## 2023-05-23 HISTORY — PX: A/V FISTULAGRAM: CATH118298

## 2023-05-23 LAB — POCT I-STAT, CHEM 8
BUN: 25 mg/dL — ABNORMAL HIGH (ref 8–23)
Calcium, Ion: 1.1 mmol/L — ABNORMAL LOW (ref 1.15–1.40)
Chloride: 97 mmol/L — ABNORMAL LOW (ref 98–111)
Creatinine, Ser: 6.9 mg/dL — ABNORMAL HIGH (ref 0.61–1.24)
Glucose, Bld: 78 mg/dL (ref 70–99)
HCT: 31 % — ABNORMAL LOW (ref 39.0–52.0)
Hemoglobin: 10.5 g/dL — ABNORMAL LOW (ref 13.0–17.0)
Potassium: 3.9 mmol/L (ref 3.5–5.1)
Sodium: 137 mmol/L (ref 135–145)
TCO2: 30 mmol/L (ref 22–32)

## 2023-05-23 SURGERY — A/V FISTULAGRAM
Anesthesia: LOCAL | Laterality: Right

## 2023-05-23 MED ORDER — LIDOCAINE HCL (PF) 1 % IJ SOLN
INTRAMUSCULAR | Status: DC | PRN
  Administered 2023-05-23: 15 mL

## 2023-05-23 MED ORDER — LIDOCAINE HCL (PF) 1 % IJ SOLN
INTRAMUSCULAR | Status: AC
  Filled 2023-05-23: qty 30

## 2023-05-23 MED ORDER — DIPHENHYDRAMINE HCL 50 MG/ML IJ SOLN
25.0000 mg | INTRAMUSCULAR | Status: AC
  Administered 2023-05-23: 25 mg via INTRAVENOUS
  Filled 2023-05-23: qty 1

## 2023-05-23 MED ORDER — SODIUM CHLORIDE 0.9 % IV SOLN
INTRAVENOUS | Status: DC | PRN
  Administered 2023-05-23: 500 mL

## 2023-05-23 MED ORDER — SODIUM CHLORIDE 0.9% FLUSH: 3.0000 mL | INTRAVENOUS | Status: DC | PRN

## 2023-05-23 MED ORDER — IODIXANOL 320 MG/ML IV SOLN
INTRAVENOUS | Status: DC | PRN
  Administered 2023-05-23: 60 mL

## 2023-05-23 MED ORDER — METHYLPREDNISOLONE SODIUM SUCC 125 MG IJ SOLR
125.0000 mg | INTRAMUSCULAR | Status: AC
  Administered 2023-05-23: 125 mg via INTRAVENOUS
  Filled 2023-05-23: qty 2

## 2023-05-23 SURGICAL SUPPLY — 11 items
BALLN MUSTANG 7.0X40 75 (BALLOONS) ×1
BALLOON MUSTANG 7.0X40 75 (BALLOONS) IMPLANT
COVER DOME SNAP 22 D (MISCELLANEOUS) ×2 IMPLANT
GLIDEWIRE ADV .035X260CM (WIRE) IMPLANT
KIT ENCORE 26 ADVANTAGE (KITS) IMPLANT
KIT MICROPUNCTURE NIT STIFF (SHEATH) IMPLANT
SHEATH PINNACLE R/O II 6F 4CM (SHEATH) IMPLANT
SHEATH PROBE COVER 6X72 (BAG) ×2 IMPLANT
TRAY PV CATH (CUSTOM PROCEDURE TRAY) ×2 IMPLANT
TUBING CIL FLEX 10 FLL-RA (TUBING) ×2 IMPLANT
WIRE BENTSON .035X145CM (WIRE) IMPLANT

## 2023-05-23 NOTE — Progress Notes (Signed)
Patient and wife was given discharge instructions. Both verbalized understanding.

## 2023-05-23 NOTE — Op Note (Addendum)
Patient name: [name] MRN: [id number] DOB: [birth year] Sex: male  05/23/2023 Pre-operative Diagnosis: Right arm radiocephalic fistula malfunction Post-operative diagnosis:  Same Surgeon:  Victorino Sparrow, MD Procedure Performed: 1.  Ultrasound-guided micropuncture access of the right radiocephalic fistula 2.  Fistulogram 3.  7 x 40 balloon angioplasty of the fistula at the peripheral portion of the previously placed stent. 4.  Venotomy managed with Monocryl suture   Indications: Patient is a 62 year old male with end-stage renal disease currently being dialyzed through a right sided radiocephalic fistula.  The fistula has had prolonged bleeding after dialysis, and has a few areas of superficial ulceration.  Denies spontaneous bleeds.  Does not pick at the eschars present.  After discussing risks and benefits in clinic, Dash elected to proceed with right arm fistulogram with possible intervention in an effort to define outflow for possible revision.  Findings:  Widely patent arterial anastomosis at the radial artery Greater than 70% focal stenosis at the peripheral and of previously placed Viabahn stent. Otherwise the fistula is widely patent without stenosis.   Procedure:  The patient was identified in the holding area and taken to room 8.  The patient was then placed supine on the table and prepped and draped in the usual sterile fashion.  A time out was called.  Ultrasound was used to evaluate the right arm radiocephalic fistula.  An ultrasound-guided micropuncture needle was used to access in antegrade fashion.  Fistulogram followed.  See results above.  I elected to attempt intervention on the focal stenosis appreciated at the peripheral aspect of the previously placed stent.  A wire was run into the upper arm cephalic vein, followed by a 6 [place] sheath.  A 7 x 40 mm balloon was brought onto the field and expanded across the area of stenosis into the previously placed stent.  Upon  deflation, repeat fistulogram demonstrated excellent result with resolution of flow-limiting stenosis.  There was an excellent, palpable thrill.     Impression: Successful resolution of 70% stenotic lesion using balloon venoplasty 7 x 40 mm. There is healthy tissue away from the area of ulceration that can be accessed.  I had a long conversation with [name] that he would be best served with continued use.  Should skin continue to thin, he would need revision.  My plan is to see him in a few weeks to ensure that healing is occurring.  Should he continue to have the ulcerations, I would move forward with fistula revision, with tunneled dialysis catheter placement to allow for healing.     Victorino Sparrow MD [facility] [place] Office: [phone]

## 2023-05-24 ENCOUNTER — Encounter (HOSPITAL_COMMUNITY): Payer: Self-pay | Admitting: Vascular Surgery

## 2023-05-24 DIAGNOSIS — N186 End stage renal disease: Secondary | ICD-10-CM | POA: Diagnosis not present

## 2023-05-24 DIAGNOSIS — D509 Iron deficiency anemia, unspecified: Secondary | ICD-10-CM | POA: Diagnosis not present

## 2023-05-24 DIAGNOSIS — D631 Anemia in chronic kidney disease: Secondary | ICD-10-CM | POA: Diagnosis not present

## 2023-05-24 DIAGNOSIS — E1129 Type 2 diabetes mellitus with other diabetic kidney complication: Secondary | ICD-10-CM | POA: Diagnosis not present

## 2023-05-24 DIAGNOSIS — Z992 Dependence on renal dialysis: Secondary | ICD-10-CM | POA: Diagnosis not present

## 2023-05-24 DIAGNOSIS — N2581 Secondary hyperparathyroidism of renal origin: Secondary | ICD-10-CM | POA: Diagnosis not present

## 2023-05-27 DIAGNOSIS — D509 Iron deficiency anemia, unspecified: Secondary | ICD-10-CM | POA: Diagnosis not present

## 2023-05-27 DIAGNOSIS — E1129 Type 2 diabetes mellitus with other diabetic kidney complication: Secondary | ICD-10-CM | POA: Diagnosis not present

## 2023-05-27 DIAGNOSIS — Z992 Dependence on renal dialysis: Secondary | ICD-10-CM | POA: Diagnosis not present

## 2023-05-27 DIAGNOSIS — D631 Anemia in chronic kidney disease: Secondary | ICD-10-CM | POA: Diagnosis not present

## 2023-05-27 DIAGNOSIS — N186 End stage renal disease: Secondary | ICD-10-CM | POA: Diagnosis not present

## 2023-05-27 DIAGNOSIS — N2581 Secondary hyperparathyroidism of renal origin: Secondary | ICD-10-CM | POA: Diagnosis not present

## 2023-05-28 DIAGNOSIS — Z992 Dependence on renal dialysis: Secondary | ICD-10-CM | POA: Diagnosis not present

## 2023-05-28 DIAGNOSIS — T8620 Unspecified complication of heart transplant: Secondary | ICD-10-CM | POA: Diagnosis not present

## 2023-05-28 DIAGNOSIS — N186 End stage renal disease: Secondary | ICD-10-CM | POA: Diagnosis not present

## 2023-05-29 DIAGNOSIS — D509 Iron deficiency anemia, unspecified: Secondary | ICD-10-CM | POA: Diagnosis not present

## 2023-05-29 DIAGNOSIS — E1129 Type 2 diabetes mellitus with other diabetic kidney complication: Secondary | ICD-10-CM | POA: Diagnosis not present

## 2023-05-29 DIAGNOSIS — N186 End stage renal disease: Secondary | ICD-10-CM | POA: Diagnosis not present

## 2023-05-29 DIAGNOSIS — N2581 Secondary hyperparathyroidism of renal origin: Secondary | ICD-10-CM | POA: Diagnosis not present

## 2023-05-29 DIAGNOSIS — D631 Anemia in chronic kidney disease: Secondary | ICD-10-CM | POA: Diagnosis not present

## 2023-05-29 DIAGNOSIS — Z992 Dependence on renal dialysis: Secondary | ICD-10-CM | POA: Diagnosis not present

## 2023-05-31 DIAGNOSIS — E1129 Type 2 diabetes mellitus with other diabetic kidney complication: Secondary | ICD-10-CM | POA: Diagnosis not present

## 2023-05-31 DIAGNOSIS — N2581 Secondary hyperparathyroidism of renal origin: Secondary | ICD-10-CM | POA: Diagnosis not present

## 2023-05-31 DIAGNOSIS — Z992 Dependence on renal dialysis: Secondary | ICD-10-CM | POA: Diagnosis not present

## 2023-05-31 DIAGNOSIS — D509 Iron deficiency anemia, unspecified: Secondary | ICD-10-CM | POA: Diagnosis not present

## 2023-05-31 DIAGNOSIS — N186 End stage renal disease: Secondary | ICD-10-CM | POA: Diagnosis not present

## 2023-05-31 NOTE — H&P (Signed)
Patient seen and examined in preop holding.  No complaints. No changes to medication history or physical exam since last seen in clinic. After discussing the risks and benefits of fistulagram, [name] elected to proceed.   Victorino Sparrow MD   Patient ID: [name], male   DOB: [birth year], 62 y.o.   MRN: [id number]  Reason for Consult: No chief complaint on file.   Referred by No ref. provider found  Subjective:     HPI:  [name] is a 62 y.o. male history of end-stage renal disease on dialysis Mondays, Wednesdays and Fridays via right arm fistula.  He states that he has had this present for approximately 8 years.  He has had revisions in the past he is not aware of any stents that have been placed.  He has had ulceration over this fistula for several months states that it is somewhat healing but continues to bleed with each dialysis session and he still having some oozing from yesterday's session.  He has not required any emergent operations for bleeding from the fistula.  He does not take any blood thinners.  Past Medical History:  Diagnosis Date   Arthritis    Atrial fibrillation (HCC)    CHF (congestive heart failure), NYHA class III (HCC)    s/p heart transplant   Chronic kidney disease    end stage   Chronic systolic dysfunction of left ventricle    CVA (cerebral infarction)    Diabetes mellitus    PMH; Prior to heart transplant   Family history of coronary artery disease    in both parents   GERD (gastroesophageal reflux disease)    Hyperlipidemia    Hypertension    Left bundle branch block    Morbid obesity (HCC)    status post lap band   Myocardial infarction [facility])    prior to heart transplant   Nonischemic cardiomyopathy (HCC)    prior to heart transplant   Obesity (BMI 30-39.9)    Obstructive sleep apnea    no longer needs CPAP after heart transplant per pt   Premature ventricular contractions    Prostate cancer (HCC)    SOB (shortness of  breath)    Family History  Problem Relation Age of Onset   Coronary artery disease Father        had, PTCA & CABG   Heart attack Father    Hypertension Father    Diabetes Father    Prostate cancer Father    Coronary artery disease Mother    Heart attack Mother    Hypertension Mother    Stroke Mother    Lupus Mother    Prostate cancer Brother    Coronary artery disease Brother    Coronary artery disease Sister    Heart attack Sister    Prostate cancer Paternal Uncle    Coronary artery disease Maternal Grandmother    Coronary artery disease Maternal Grandfather    Coronary artery disease Paternal Grandmother    Coronary artery disease Paternal Grandfather    Past Surgical History:  Procedure Laterality Date   A/V FISTULAGRAM Right 05/23/2023   Procedure: A/V Fistulagram;  Surgeon: Victorino Sparrow, MD;  Location: [facility] INVASIVE CV LAB;  Service: Cardiovascular;  Laterality: Right;   CARDIAC PACEMAKER PLACEMENT  09/21/2009   Biventricular implantable cardioverter-defibrillator implantation      COLONOSCOPY W/ BIOPSIES AND POLYPECTOMY     CYSTOSCOPY WITH FULGERATION N/A 04/27/2021   Procedure: CYSTOSCOPY AND CLOT EVACUATION  WITH BLADDER BIOPSY/ FUGARATION OF BLADDER/ FULGARATION OF PROSTATE ;  Surgeon: Jerilee Field, MD;  Location: [facility] ORS;  Service: Urology;  Laterality: N/A;   FOOT SURGERY     left   HEART TRANSPLANT  2014   LAPAROSCOPIC GASTRIC BANDING  01/27/2007   LEFT VENTRICULAR ASSIST DEVICE     implanted at [facility]   PACEMAKER REMOVAL     PROSTATE BIOPSY     TOTAL HIP ARTHROPLASTY Right 07/22/2017   Procedure: RIGHT TOTAL HIP ARTHROPLASTY ANTERIOR APPROACH;  Surgeon: Samson Frederic, MD;  Location: [facility] OR;  Service: Orthopedics;  Laterality: Right;  Needs RNFA   TOTAL KNEE ARTHROPLASTY Right 07/22/2017    Short Social History:  Social History   Tobacco Use   Smoking status: Never   Smokeless tobacco: Former    Types: Snuff  Substance Use Topics   Alcohol use: No     Allergies  Allergen Reactions   Heparin Nausea Only, Swelling and Other (See Comments)    * * HIT * *  SWELLING REACTION UNSPECIFIED   DIAPHORESIS    Iodinated Contrast Media    Penicillin G Potassium [Penicillin G] Nausea Only and Other (See Comments)    DIAPHORESIS    Current Facility-Administered Medications  Medication Dose Route Frequency Provider Last Rate Last Admin   0.9 %  sodium chloride infusion  250 mL Intravenous PRN Cephus Shelling, MD       sodium chloride flush (NS) 0.9 % injection 3 mL  3 mL Intravenous Q12H Cephus Shelling, MD       Current Outpatient Medications  Medication Sig Dispense Refill   acetaminophen (TYLENOL) 500 MG tablet Take 1,000 mg by mouth in the morning and at bedtime.     ARIPiprazole (ABILIFY) 10 MG tablet Take 10 mg by mouth every morning.     cinacalcet (SENSIPAR) 60 MG tablet Take 60 mg by mouth daily.     cycloSPORINE modified 25 MG CAPS Take 125 mg by mouth daily.     divalproex (DEPAKOTE) 125 MG DR tablet Take 250 mg by mouth in morning and 500 mg by mouth at night 180 tablet 11   DULoxetine (CYMBALTA) 60 MG capsule Take 1 capsule (60 mg total) by mouth daily. 90 capsule 3   hydrocortisone (ANUSOL-HC) 2.5 % rectal cream Apply 1 Application topically as needed for hemorrhoids or anal itching.     hydrOXYzine (ATARAX) 25 MG tablet Take 25 mg by mouth 2 (two) times daily.     ibuprofen (ADVIL) 200 MG tablet Take 400 mg by mouth as needed for moderate pain.     lidocaine (LIDODERM) 5 % Place 1 patch onto the skin daily. Remove & Discard patch within 12 hours or as directed by MD (Patient taking differently: Place 1 patch onto the skin as needed (pain). Remove & Discard patch within 12 hours or as directed by MD) 12 patch 0   midodrine (PROAMATINE) 10 MG tablet Take 1 tablet (10 mg total) by mouth 3 (three) times daily.     mycophenolate (MYFORTIC) 180 MG EC tablet Take 720 mg by mouth 2 (two) times daily.      oxyCODONE-acetaminophen (PERCOCET) 10-325 MG tablet Take 1 tablet by mouth 2 (two) times daily as needed.     pregabalin (LYRICA) 50 MG capsule Take 50 mg by mouth 2 (two) times daily.     rosuvastatin (CRESTOR) 10 MG tablet Take 1 tablet (10 mg total) by mouth daily. 30 tablet 1   sevelamer carbonate (  RENVELA) 800 MG tablet Take 2 tablets (1,600 mg total) by mouth 3 (three) times daily with meals. (Patient taking differently: Take 3,200 mg by mouth 3 (three) times daily with meals.) 120 tablet 0   sucroferric oxyhydroxide (VELPHORO) 500 MG chewable tablet Chew 500 mg by mouth in the morning and at bedtime.     traZODone (DESYREL) 100 MG tablet Take 100 mg by mouth at bedtime.     ELIQUIS 2.5 MG TABS tablet Take 1 tablet (2.5 mg total) by mouth 2 (two) times daily. 60 tablet    naloxone (NARCAN) nasal spray 4 mg/0.1 mL Place 0.4 mg into the nose once.     ondansetron (ZOFRAN) 4 MG tablet Take 1 tablet (4 mg total) by mouth every 6 (six) hours as needed for nausea. (Patient taking differently: Take 4 mg by mouth as needed for nausea.) 20 tablet 0    Review of Systems  Constitutional:  Constitutional negative. HENT: HENT negative.  Eyes: Eyes negative.  Respiratory: Respiratory negative.  Cardiovascular: Positive for leg swelling.  GI: Gastrointestinal negative.  Musculoskeletal: Musculoskeletal negative.  Skin: Positive for rash.  Neurological: Positive for focal weakness.  Hematologic: Hematologic/lymphatic negative.  Psychiatric: Psychiatric negative.        Objective:  Objective   Vitals:   05/23/23 1255 05/23/23 1300 05/23/23 1305 05/23/23 1328  BP: 98/72 95/69 97/69  103/77  Pulse: 86 86 87   Resp: 19 18 16    SpO2: 94% 93% 96% 99%   There is no height or weight on file to calculate BMI.  Physical Exam HENT:     Head: Normocephalic.     Nose: Nose normal.  Eyes:     Pupils: Pupils are equal, round, and reactive to light.  Cardiovascular:     Rate and Rhythm: Normal  rate.     Pulses:          Radial pulses are 2+ on the right side.  Pulmonary:     Effort: Pulmonary effort is normal.  Abdominal:     General: Abdomen is flat.  Musculoskeletal:     Right lower leg: Edema present.     Left lower leg: Edema present.  Neurological:     General: No focal deficit present.     Mental Status: He is alert.  Psychiatric:        Mood and Affect: Mood normal.        Thought Content: Thought content normal.        Judgment: Judgment normal.         Assessment/Plan:     62 year old male on dialysis via right forearm AV fistula.  His fistula appears to have some previous revisions with maybe a jump graft up to the cephalic vein.  There is a thrill in the cephalic vein more cephalad above the antecubitum.  I discussed with the patient and his wife that this fistula may be nearing its end but we will do everything reasonably possible to maintain access in the forearm although he may require a catheter for some period of time.  We will begin with fistulogram to get the lay of the land and can plan for either interposition grafting versus pseudoaneurysm revision versus conversion upper arm fistula versus graft on a nondialysis day pending fistulogram results.  They understand that this may be performed by one of my partners given that he is a Monday Wednesday Friday dialysis patient       [facility]

## 2023-06-03 DIAGNOSIS — D509 Iron deficiency anemia, unspecified: Secondary | ICD-10-CM | POA: Diagnosis not present

## 2023-06-03 DIAGNOSIS — E1129 Type 2 diabetes mellitus with other diabetic kidney complication: Secondary | ICD-10-CM | POA: Diagnosis not present

## 2023-06-03 DIAGNOSIS — N186 End stage renal disease: Secondary | ICD-10-CM | POA: Diagnosis not present

## 2023-06-03 DIAGNOSIS — N2581 Secondary hyperparathyroidism of renal origin: Secondary | ICD-10-CM | POA: Diagnosis not present

## 2023-06-03 DIAGNOSIS — Z992 Dependence on renal dialysis: Secondary | ICD-10-CM | POA: Diagnosis not present

## 2023-06-05 DIAGNOSIS — D509 Iron deficiency anemia, unspecified: Secondary | ICD-10-CM | POA: Diagnosis not present

## 2023-06-05 DIAGNOSIS — N186 End stage renal disease: Secondary | ICD-10-CM | POA: Diagnosis not present

## 2023-06-05 DIAGNOSIS — Z992 Dependence on renal dialysis: Secondary | ICD-10-CM | POA: Diagnosis not present

## 2023-06-05 DIAGNOSIS — E1129 Type 2 diabetes mellitus with other diabetic kidney complication: Secondary | ICD-10-CM | POA: Diagnosis not present

## 2023-06-05 DIAGNOSIS — N2581 Secondary hyperparathyroidism of renal origin: Secondary | ICD-10-CM | POA: Diagnosis not present

## 2023-06-07 DIAGNOSIS — Z992 Dependence on renal dialysis: Secondary | ICD-10-CM | POA: Diagnosis not present

## 2023-06-07 DIAGNOSIS — D509 Iron deficiency anemia, unspecified: Secondary | ICD-10-CM | POA: Diagnosis not present

## 2023-06-07 DIAGNOSIS — E1129 Type 2 diabetes mellitus with other diabetic kidney complication: Secondary | ICD-10-CM | POA: Diagnosis not present

## 2023-06-07 DIAGNOSIS — N186 End stage renal disease: Secondary | ICD-10-CM | POA: Diagnosis not present

## 2023-06-07 DIAGNOSIS — N2581 Secondary hyperparathyroidism of renal origin: Secondary | ICD-10-CM | POA: Diagnosis not present

## 2023-06-10 DIAGNOSIS — E1129 Type 2 diabetes mellitus with other diabetic kidney complication: Secondary | ICD-10-CM | POA: Diagnosis not present

## 2023-06-10 DIAGNOSIS — N2581 Secondary hyperparathyroidism of renal origin: Secondary | ICD-10-CM | POA: Diagnosis not present

## 2023-06-10 DIAGNOSIS — N186 End stage renal disease: Secondary | ICD-10-CM | POA: Diagnosis not present

## 2023-06-10 DIAGNOSIS — D509 Iron deficiency anemia, unspecified: Secondary | ICD-10-CM | POA: Diagnosis not present

## 2023-06-10 DIAGNOSIS — Z992 Dependence on renal dialysis: Secondary | ICD-10-CM | POA: Diagnosis not present

## 2023-06-12 ENCOUNTER — Ambulatory Visit (INDEPENDENT_AMBULATORY_CARE_PROVIDER_SITE_OTHER): Payer: Medicare Other | Admitting: Physician Assistant

## 2023-06-12 VITALS — BP 86/59 | HR 82 | Temp 98.2°F | Resp 18 | Ht 78.0 in

## 2023-06-12 DIAGNOSIS — N186 End stage renal disease: Secondary | ICD-10-CM | POA: Diagnosis not present

## 2023-06-12 DIAGNOSIS — D509 Iron deficiency anemia, unspecified: Secondary | ICD-10-CM | POA: Diagnosis not present

## 2023-06-12 DIAGNOSIS — E1129 Type 2 diabetes mellitus with other diabetic kidney complication: Secondary | ICD-10-CM | POA: Diagnosis not present

## 2023-06-12 DIAGNOSIS — Z992 Dependence on renal dialysis: Secondary | ICD-10-CM | POA: Diagnosis not present

## 2023-06-12 DIAGNOSIS — N2581 Secondary hyperparathyroidism of renal origin: Secondary | ICD-10-CM | POA: Diagnosis not present

## 2023-06-12 NOTE — H&P (View-Only) (Signed)
Office Note   History of Present Illness   [name] is a 62 y.o. male who is s/p right arm fistulogram with balloon angioplasty of previous Viabahn stent on 05/23/2023 by Dr. Karin Lieu.  This was done for a radiocephalic fistula with prolonged bleeding events and superficial ulcerations.  Pt returns today for follow up.  Pt states they have been able to use his fistula at healthy skin sites.  He denies any further issues with prolonged bleeding.  He denies any spontaneous bleeding events.   He dialyzes on Mondays, Wednesdays, and Fridays.  Current Outpatient Medications  Medication Sig Dispense Refill   acetaminophen (TYLENOL) 500 MG tablet Take 1,000 mg by mouth in the morning and at bedtime.     ARIPiprazole (ABILIFY) 10 MG tablet Take 10 mg by mouth every morning.     cinacalcet (SENSIPAR) 60 MG tablet Take 60 mg by mouth daily.     cycloSPORINE modified 25 MG CAPS Take 125 mg by mouth daily.     divalproex (DEPAKOTE) 125 MG DR tablet Take 250 mg by mouth in morning and 500 mg by mouth at night 180 tablet 11   DULoxetine (CYMBALTA) 60 MG capsule Take 1 capsule (60 mg total) by mouth daily. 90 capsule 3   ELIQUIS 2.5 MG TABS tablet Take 1 tablet (2.5 mg total) by mouth 2 (two) times daily. 60 tablet    hydrocortisone (ANUSOL-HC) 2.5 % rectal cream Apply 1 Application topically as needed for hemorrhoids or anal itching.     hydrOXYzine (ATARAX) 25 MG tablet Take 25 mg by mouth 2 (two) times daily.     ibuprofen (ADVIL) 200 MG tablet Take 400 mg by mouth as needed for moderate pain.     lidocaine (LIDODERM) 5 % Place 1 patch onto the skin daily. Remove & Discard patch within 12 hours or as directed by MD (Patient not taking: Reported on 06/12/2023) 12 patch 0   midodrine (PROAMATINE) 10 MG tablet Take 1 tablet (10 mg total) by mouth 3 (three) times daily.     mycophenolate (MYFORTIC) 180 MG EC tablet Take 720 mg by mouth 2 (two) times daily.     naloxone (NARCAN) nasal spray 4 mg/0.1  mL Place 0.4 mg into the nose once.     ondansetron (ZOFRAN) 4 MG tablet Take 1 tablet (4 mg total) by mouth every 6 (six) hours as needed for nausea. (Patient taking differently: Take 4 mg by mouth as needed for nausea.) 20 tablet 0   oxyCODONE-acetaminophen (PERCOCET) 10-325 MG tablet Take 1 tablet by mouth 2 (two) times daily as needed.     pregabalin (LYRICA) 50 MG capsule Take 50 mg by mouth 2 (two) times daily.     rosuvastatin (CRESTOR) 10 MG tablet Take 1 tablet (10 mg total) by mouth daily. 30 tablet 1   sevelamer carbonate (RENVELA) 800 MG tablet Take 2 tablets (1,600 mg total) by mouth 3 (three) times daily with meals. (Patient taking differently: Take 3,200 mg by mouth 3 (three) times daily with meals.) 120 tablet 0   sucroferric oxyhydroxide (VELPHORO) 500 MG chewable tablet Chew 500 mg by mouth in the morning and at bedtime.     traZODone (DESYREL) 100 MG tablet Take 100 mg by mouth at bedtime.     Current Facility-Administered Medications  Medication Dose Route Frequency Provider Last Rate Last Admin   0.9 %  sodium chloride infusion  250 mL Intravenous PRN Cephus Shelling, MD  sodium chloride flush (NS) 0.9 % injection 3 mL  3 mL Intravenous Q12H Cephus Shelling, MD       Physical Exam:   General: no acute distress CV: regular Pulm: nonlabored Extremities: Right radiocephalic fistula with several ulcerations not improved in appearance.  Recent dialysis cannula sites with minimal bleeding.  Neuro: Intact motor and sensation of right upper extremity      Assessment/Plan:  This is a 62 y.o. male who is s/p: Right upper extremity fistulogram  -The patient recently underwent fistulogram with balloon angioplasty of a previously placed Viabahn stent.  This was done for right radiocephalic fistula prolonged bleeding with ulcerations -Despite fistulogram and rest of the ulcerated areas, the patient's ulcerations have not improved.  Thankfully he no longer has any  prolonged bleeding.  The patient and family understand that his fistula is at risk for spontaneous bleeding due to his ulcerations. -We will proceed with conversion to right forearm AV graft and TDC placement within the next couple of weeks on a nondialysis day.  This can be on a Tuesday or Thursday with Dr. Randie Heinz.  He will need to hold his Eliquis prior to the procedure. -In the interim the patient can continue to use his fistula at healthy skin sites for dialysis access.   Loel Dubonnet PA-C [facility] [place] Office: [phone]  Clinic MD: Randie Heinz

## 2023-06-12 NOTE — Progress Notes (Signed)
Office Note   History of Present Illness   [name] is a 62 y.o. male who is s/p right arm fistulogram with balloon angioplasty of previous Viabahn stent on 05/23/2023 by Dr. Karin Lieu.  This was done for a radiocephalic fistula with prolonged bleeding events and superficial ulcerations.  Pt returns today for follow up.  Pt states they have been able to use his fistula at healthy skin sites.  He denies any further issues with prolonged bleeding.  He denies any spontaneous bleeding events.   He dialyzes on Mondays, Wednesdays, and Fridays.  Current Outpatient Medications  Medication Sig Dispense Refill   acetaminophen (TYLENOL) 500 MG tablet Take 1,000 mg by mouth in the morning and at bedtime.     ARIPiprazole (ABILIFY) 10 MG tablet Take 10 mg by mouth every morning.     cinacalcet (SENSIPAR) 60 MG tablet Take 60 mg by mouth daily.     cycloSPORINE modified 25 MG CAPS Take 125 mg by mouth daily.     divalproex (DEPAKOTE) 125 MG DR tablet Take 250 mg by mouth in morning and 500 mg by mouth at night 180 tablet 11   DULoxetine (CYMBALTA) 60 MG capsule Take 1 capsule (60 mg total) by mouth daily. 90 capsule 3   ELIQUIS 2.5 MG TABS tablet Take 1 tablet (2.5 mg total) by mouth 2 (two) times daily. 60 tablet    hydrocortisone (ANUSOL-HC) 2.5 % rectal cream Apply 1 Application topically as needed for hemorrhoids or anal itching.     hydrOXYzine (ATARAX) 25 MG tablet Take 25 mg by mouth 2 (two) times daily.     ibuprofen (ADVIL) 200 MG tablet Take 400 mg by mouth as needed for moderate pain.     lidocaine (LIDODERM) 5 % Place 1 patch onto the skin daily. Remove & Discard patch within 12 hours or as directed by MD (Patient not taking: Reported on 06/12/2023) 12 patch 0   midodrine (PROAMATINE) 10 MG tablet Take 1 tablet (10 mg total) by mouth 3 (three) times daily.     mycophenolate (MYFORTIC) 180 MG EC tablet Take 720 mg by mouth 2 (two) times daily.     naloxone (NARCAN) nasal spray 4 mg/0.1  mL Place 0.4 mg into the nose once.     ondansetron (ZOFRAN) 4 MG tablet Take 1 tablet (4 mg total) by mouth every 6 (six) hours as needed for nausea. (Patient taking differently: Take 4 mg by mouth as needed for nausea.) 20 tablet 0   oxyCODONE-acetaminophen (PERCOCET) 10-325 MG tablet Take 1 tablet by mouth 2 (two) times daily as needed.     pregabalin (LYRICA) 50 MG capsule Take 50 mg by mouth 2 (two) times daily.     rosuvastatin (CRESTOR) 10 MG tablet Take 1 tablet (10 mg total) by mouth daily. 30 tablet 1   sevelamer carbonate (RENVELA) 800 MG tablet Take 2 tablets (1,600 mg total) by mouth 3 (three) times daily with meals. (Patient taking differently: Take 3,200 mg by mouth 3 (three) times daily with meals.) 120 tablet 0   sucroferric oxyhydroxide (VELPHORO) 500 MG chewable tablet Chew 500 mg by mouth in the morning and at bedtime.     traZODone (DESYREL) 100 MG tablet Take 100 mg by mouth at bedtime.     Current Facility-Administered Medications  Medication Dose Route Frequency Provider Last Rate Last Admin   0.9 %  sodium chloride infusion  250 mL Intravenous PRN Cephus Shelling, MD  sodium chloride flush (NS) 0.9 % injection 3 mL  3 mL Intravenous Q12H Cephus Shelling, MD       Physical Exam:   General: no acute distress CV: regular Pulm: nonlabored Extremities: Right radiocephalic fistula with several ulcerations not improved in appearance.  Recent dialysis cannula sites with minimal bleeding.  Neuro: Intact motor and sensation of right upper extremity      Assessment/Plan:  This is a 62 y.o. male who is s/p: Right upper extremity fistulogram  -The patient recently underwent fistulogram with balloon angioplasty of a previously placed Viabahn stent.  This was done for right radiocephalic fistula prolonged bleeding with ulcerations -Despite fistulogram and rest of the ulcerated areas, the patient's ulcerations have not improved.  Thankfully he no longer has any  prolonged bleeding.  The patient and family understand that his fistula is at risk for spontaneous bleeding due to his ulcerations. -We will proceed with conversion to right forearm AV graft and TDC placement within the next couple of weeks on a nondialysis day.  This can be on a Tuesday or Thursday with Dr. Randie Heinz.  He will need to hold his Eliquis prior to the procedure. -In the interim the patient can continue to use his fistula at healthy skin sites for dialysis access.   Loel Dubonnet PA-C [facility] [place] Office: [phone]  Clinic MD: Randie Heinz

## 2023-06-13 ENCOUNTER — Other Ambulatory Visit: Payer: Self-pay | Admitting: *Deleted

## 2023-06-13 DIAGNOSIS — N186 End stage renal disease: Secondary | ICD-10-CM

## 2023-06-14 DIAGNOSIS — Z992 Dependence on renal dialysis: Secondary | ICD-10-CM | POA: Diagnosis not present

## 2023-06-14 DIAGNOSIS — D509 Iron deficiency anemia, unspecified: Secondary | ICD-10-CM | POA: Diagnosis not present

## 2023-06-14 DIAGNOSIS — N2581 Secondary hyperparathyroidism of renal origin: Secondary | ICD-10-CM | POA: Diagnosis not present

## 2023-06-14 DIAGNOSIS — E1129 Type 2 diabetes mellitus with other diabetic kidney complication: Secondary | ICD-10-CM | POA: Diagnosis not present

## 2023-06-14 DIAGNOSIS — N186 End stage renal disease: Secondary | ICD-10-CM | POA: Diagnosis not present

## 2023-06-17 DIAGNOSIS — E559 Vitamin D deficiency, unspecified: Secondary | ICD-10-CM | POA: Diagnosis not present

## 2023-06-17 DIAGNOSIS — I779 Disorder of arteries and arterioles, unspecified: Secondary | ICD-10-CM | POA: Diagnosis not present

## 2023-06-17 DIAGNOSIS — N2581 Secondary hyperparathyroidism of renal origin: Secondary | ICD-10-CM | POA: Diagnosis not present

## 2023-06-17 DIAGNOSIS — D509 Iron deficiency anemia, unspecified: Secondary | ICD-10-CM | POA: Diagnosis not present

## 2023-06-17 DIAGNOSIS — E1129 Type 2 diabetes mellitus with other diabetic kidney complication: Secondary | ICD-10-CM | POA: Diagnosis not present

## 2023-06-17 DIAGNOSIS — N186 End stage renal disease: Secondary | ICD-10-CM | POA: Diagnosis not present

## 2023-06-17 DIAGNOSIS — Z992 Dependence on renal dialysis: Secondary | ICD-10-CM | POA: Diagnosis not present

## 2023-06-17 DIAGNOSIS — R5383 Other fatigue: Secondary | ICD-10-CM | POA: Diagnosis not present

## 2023-06-19 ENCOUNTER — Emergency Department (HOSPITAL_COMMUNITY): Payer: Medicare Other

## 2023-06-19 ENCOUNTER — Other Ambulatory Visit: Payer: Self-pay

## 2023-06-19 ENCOUNTER — Emergency Department (HOSPITAL_COMMUNITY)
Admission: EM | Admit: 2023-06-19 | Discharge: 2023-06-20 | Disposition: A | Discharge status: Home / Self Care | Payer: Medicare Other | Attending: Emergency Medicine | Admitting: Emergency Medicine

## 2023-06-19 ENCOUNTER — Encounter (HOSPITAL_COMMUNITY): Payer: Self-pay

## 2023-06-19 DIAGNOSIS — Z79899 Other long term (current) drug therapy: Secondary | ICD-10-CM | POA: Diagnosis not present

## 2023-06-19 DIAGNOSIS — E1122 Type 2 diabetes mellitus with diabetic chronic kidney disease: Secondary | ICD-10-CM | POA: Insufficient documentation

## 2023-06-19 DIAGNOSIS — D509 Iron deficiency anemia, unspecified: Secondary | ICD-10-CM | POA: Diagnosis not present

## 2023-06-19 DIAGNOSIS — N186 End stage renal disease: Secondary | ICD-10-CM | POA: Diagnosis not present

## 2023-06-19 DIAGNOSIS — G9389 Other specified disorders of brain: Secondary | ICD-10-CM | POA: Diagnosis not present

## 2023-06-19 DIAGNOSIS — W19XXXA Unspecified fall, initial encounter: Secondary | ICD-10-CM | POA: Insufficient documentation

## 2023-06-19 DIAGNOSIS — Z992 Dependence on renal dialysis: Secondary | ICD-10-CM | POA: Insufficient documentation

## 2023-06-19 DIAGNOSIS — F0390 Unspecified dementia without behavioral disturbance: Secondary | ICD-10-CM | POA: Diagnosis not present

## 2023-06-19 DIAGNOSIS — Z043 Encounter for examination and observation following other accident: Secondary | ICD-10-CM | POA: Insufficient documentation

## 2023-06-19 DIAGNOSIS — I120 Hypertensive chronic kidney disease with stage 5 chronic kidney disease or end stage renal disease: Secondary | ICD-10-CM | POA: Insufficient documentation

## 2023-06-19 DIAGNOSIS — M545 Low back pain, unspecified: Secondary | ICD-10-CM | POA: Insufficient documentation

## 2023-06-19 DIAGNOSIS — M549 Dorsalgia, unspecified: Secondary | ICD-10-CM | POA: Diagnosis not present

## 2023-06-19 DIAGNOSIS — E1129 Type 2 diabetes mellitus with other diabetic kidney complication: Secondary | ICD-10-CM | POA: Diagnosis not present

## 2023-06-19 DIAGNOSIS — R197 Diarrhea, unspecified: Secondary | ICD-10-CM | POA: Diagnosis not present

## 2023-06-19 DIAGNOSIS — I6782 Cerebral ischemia: Secondary | ICD-10-CM | POA: Diagnosis not present

## 2023-06-19 DIAGNOSIS — Z7901 Long term (current) use of anticoagulants: Secondary | ICD-10-CM | POA: Diagnosis not present

## 2023-06-19 DIAGNOSIS — M5137 Other intervertebral disc degeneration, lumbosacral region with discogenic back pain only: Secondary | ICD-10-CM | POA: Diagnosis not present

## 2023-06-19 DIAGNOSIS — N2581 Secondary hyperparathyroidism of renal origin: Secondary | ICD-10-CM | POA: Diagnosis not present

## 2023-06-19 DIAGNOSIS — M47816 Spondylosis without myelopathy or radiculopathy, lumbar region: Secondary | ICD-10-CM | POA: Diagnosis not present

## 2023-06-19 LAB — COMPREHENSIVE METABOLIC PANEL
ALT: 10 U/L (ref 0–44)
AST: 13 U/L — ABNORMAL LOW (ref 15–41)
Albumin: 2.9 g/dL — ABNORMAL LOW (ref 3.5–5.0)
Alkaline Phosphatase: 77 U/L (ref 38–126)
Anion gap: 14 (ref 5–15)
BUN: 13 mg/dL (ref 8–23)
CO2: 30 mmol/L (ref 22–32)
Calcium: 10 mg/dL (ref 8.9–10.3)
Chloride: 93 mmol/L — ABNORMAL LOW (ref 98–111)
Creatinine, Ser: 5.61 mg/dL — ABNORMAL HIGH (ref 0.61–1.24)
GFR, Estimated: 11 mL/min — ABNORMAL LOW (ref >60)
Glucose, Bld: 87 mg/dL (ref 70–99)
Potassium: 3.6 mmol/L (ref 3.5–5.1)
Sodium: 137 mmol/L (ref 135–145)
Total Bilirubin: 1.3 mg/dL — ABNORMAL HIGH (ref 0.3–1.2)
Total Protein: 6.6 g/dL (ref 6.5–8.1)

## 2023-06-19 LAB — CBC WITH DIFFERENTIAL/PLATELET
Abs Immature Granulocytes: 0.05 10*3/uL (ref 0.00–0.07)
Basophils Absolute: 0 10*3/uL (ref 0.0–0.1)
Basophils Relative: 1 %
Eosinophils Absolute: 0 10*3/uL (ref 0.0–0.5)
Eosinophils Relative: 1 %
HCT: 32.5 % — ABNORMAL LOW (ref 39.0–52.0)
Hemoglobin: 10.3 g/dL — ABNORMAL LOW (ref 13.0–17.0)
Immature Granulocytes: 1 %
Lymphocytes Relative: 10 %
Lymphs Abs: 0.7 10*3/uL (ref 0.7–4.0)
MCH: 28.8 pg (ref 26.0–34.0)
MCHC: 31.7 g/dL (ref 30.0–36.0)
MCV: 90.8 fL (ref 80.0–100.0)
Monocytes Absolute: 0.8 10*3/uL (ref 0.1–1.0)
Monocytes Relative: 12 %
Neutro Abs: 5 10*3/uL (ref 1.7–7.7)
Neutrophils Relative %: 75 %
Platelets: 200 10*3/uL (ref 150–400)
RBC: 3.58 MIL/uL — ABNORMAL LOW (ref 4.22–5.81)
RDW: 18 % — ABNORMAL HIGH (ref 11.5–15.5)
WBC: 6.6 10*3/uL (ref 4.0–10.5)
nRBC: 0 % (ref 0.0–0.2)

## 2023-06-19 NOTE — ED Provider Notes (Signed)
[place] EMERGENCY DEPARTMENT AT [facility] Provider Note   CSN: [id number] Arrival date & time: 06/19/23  2026     History  Chief Complaint  Patient presents with   [name] is a 62 y.o. male with a history of heart transplant, type 2 diabetes, diabetic peripheral neuropathy, hypertension, A-fib on Eliquis, ESRD on dialysis, dementia, who is brought in by his wife with concern for a fall that occurred 1 week ago.  Patient was ambulating with his walker to his wheelchair, and tripped. There was no loss of consciousness, patient did not hit his head.  Wife reports he has been moving more slowly in the last week, and has been having some diarrhea.  Patient unable to clearly communicate at baseline due to dementia, but has not been complaining of lower back pain per the wife.  Wife denies any loss of bowel or bladder control, urinary retention, fever or chills.   Fall       Home Medications Prior to Admission medications   Medication Sig Start Date End Date Taking? Authorizing Provider  acetaminophen (TYLENOL) 500 MG tablet Take 1,000 mg by mouth in the morning and at bedtime.    [provider]  ARIPiprazole (ABILIFY) 10 MG tablet Take 10 mg by mouth every morning.    [provider]  cinacalcet (SENSIPAR) 60 MG tablet Take 60 mg by mouth daily. 04/19/21   [provider]  cycloSPORINE modified 25 MG CAPS Take 125 mg by mouth daily. 05/09/23   [provider]  divalproex (DEPAKOTE) 125 MG DR tablet Take 250 mg by mouth in morning and 500 mg by mouth at night 12/17/22   Levert Feinstein, MD  DULoxetine (CYMBALTA) 60 MG capsule Take 1 capsule (60 mg total) by mouth daily. 11/01/22   Levert Feinstein, MD  ELIQUIS 2.5 MG TABS tablet Take 1 tablet (2.5 mg total) by mouth 2 (two) times daily. 04/30/21   Jerilee Field, MD  hydrocortisone (ANUSOL-HC) 2.5 % rectal cream Apply 1 Application topically as needed for hemorrhoids or anal itching.  11/01/22   [provider]  hydrOXYzine (ATARAX) 25 MG tablet Take 25 mg by mouth 2 (two) times daily.    [provider]  ibuprofen (ADVIL) 200 MG tablet Take 400 mg by mouth as needed for moderate pain.    [provider]  lidocaine (LIDODERM) 5 % Place 1 patch onto the skin daily. Remove & Discard patch within 12 hours or as directed by MD Patient not taking: Reported on 06/12/2023 06/24/22   Gloris Manchester, MD  midodrine (PROAMATINE) 10 MG tablet Take 1 tablet (10 mg total) by mouth 3 (three) times daily. 04/27/21   Jerilee Field, MD  mycophenolate (MYFORTIC) 180 MG EC tablet Take 720 mg by mouth 2 (two) times daily.    [provider]  naloxone [facility]) nasal spray 4 mg/0.1 mL Place 0.4 mg into the nose once.    [provider]  ondansetron (ZOFRAN) 4 MG tablet Take 1 tablet (4 mg total) by mouth every 6 (six) hours as needed for nausea. Patient taking differently: Take 4 mg by mouth as needed for nausea. 07/23/17   Swinteck, Arlys John, MD  oxyCODONE-acetaminophen (PERCOCET) 10-325 MG tablet Take 1 tablet by mouth 2 (two) times daily as needed. 05/13/23   [provider]  pregabalin (LYRICA) 50 MG capsule Take 50 mg by mouth 2 (two) times daily.    [provider]  rosuvastatin (CRESTOR) 10  MG tablet Take 1 tablet (10 mg total) by mouth daily. 07/06/22   Rhetta Mura, MD  sevelamer carbonate (RENVELA) 800 MG tablet Take 2 tablets (1,600 mg total) by mouth 3 (three) times daily with meals. Patient taking differently: Take 3,200 mg by mouth 3 (three) times daily with meals. 11/11/18   Darlin Drop, DO  sucroferric oxyhydroxide (VELPHORO) 500 MG chewable tablet Chew 500 mg by mouth in the morning and at bedtime.    [provider]  traZODone (DESYREL) 100 MG tablet Take 100 mg by mouth at bedtime. 08/18/22   [provider]      Allergies    Heparin, Iodinated contrast media, and Penicillin g potassium [penicillin  g]    Review of Systems   Review of Systems  Physical Exam Updated Vital Signs BP 92/62 (BP Location: Left Arm)   Pulse 90   Resp 16   Ht 6\' 6"  (1.981 m)   SpO2 96%   BMI 29.30 kg/m  Physical Exam Vitals and nursing note reviewed.  Constitutional:      Appearance: Normal appearance.  HENT:     Head: Atraumatic.  Cardiovascular:     Rate and Rhythm: Normal rate and regular rhythm.  Pulmonary:     Effort: Pulmonary effort is normal.     Comments: Lungs clear to auscultation bilaterally Musculoskeletal:     Comments: No spinous process tenderness palpation, no tenderness palpation of the paraspinal muscles bilaterally No tenderness palpation of the pelvis bilaterally, lower extremities bilaterally  Neurological:     General: No focal deficit present.     Mental Status: He is alert.     Comments: Baseline sensation in the lower extremities bilaterally  Psychiatric:        Mood and Affect: Mood normal.        Behavior: Behavior normal.     ED Results / Procedures / Treatments   Labs (all labs ordered are listed, but only abnormal results are displayed) Labs Reviewed  CBC WITH DIFFERENTIAL/PLATELET - Abnormal; Notable for the following components:      Result Value   RBC 3.58 (*)    Hemoglobin 10.3 (*)    HCT 32.5 (*)    RDW 18.0 (*)    All other components within normal limits  COMPREHENSIVE METABOLIC PANEL - Abnormal; Notable for the following components:   Chloride 93 (*)    Creatinine, Ser 5.61 (*)    Albumin 2.9 (*)    AST 13 (*)    Total Bilirubin 1.3 (*)    GFR, Estimated 11 (*)    All other components within normal limits    EKG None  Radiology CT Head Wo Contrast  Result Date: 06/19/2023 CLINICAL DATA:  Head trauma due to a fall.  Coagulopathy. EXAM: CT HEAD WITHOUT CONTRAST TECHNIQUE: Contiguous axial images were obtained from the base of the skull through the vertex without intravenous contrast. RADIATION DOSE REDUCTION: This exam was performed  according to the departmental dose-optimization program which includes automated exposure control, adjustment of the mA and/or kV according to patient size and/or use of iterative reconstruction technique. COMPARISON:  11/29/2022 FINDINGS: Brain: Mild diffuse cerebral atrophy. Ventricular dilatation consistent with central atrophy. Low-attenuation changes in the deep white matter consistent with small vessel ischemia. There is a focal area of encephalomalacia in the right posterior frontal region corresponding to old infarct previously seen on prior study. No mass-effect or midline shift. No abnormal extra-axial fluid collections. Basal cisterns are not effaced. No acute  intracranial hemorrhage. Vascular: No hyperdense vessel or unexpected calcification. Skull: Normal. Negative for fracture or focal lesion. Sinuses/Orbits: No acute finding. Other: None. IMPRESSION: No acute intracranial abnormalities. Chronic atrophy and small vessel ischemic changes. Old infarct in the right posterior frontal region. Electronically Signed   By: Burman Nieves M.D.   On: 06/19/2023 22:47   DG Lumbar Spine Complete  Result Date: 06/19/2023 CLINICAL DATA:  Low back pain after a fall EXAM: LUMBAR SPINE - COMPLETE 4+ VIEW COMPARISON:  MRI lumbar spine 12/15/2020 FINDINGS: Five lumbar type vertebral bodies. Visualization is somewhat limited due to overlying bowel gas and stool. Normal alignment. No vertebral compression deformities. Degenerative disc disease of the lumbosacral interspace. Mild degenerative changes in the facet joints. No focal bone lesion or bone destruction. Visualized sacrum appears intact. Surgical clips in the upper abdomen. Vascular calcifications. Postoperative changes in the right hip. IMPRESSION: Mild degenerative changes in the lumbar spine. Normal alignment. No acute displaced fractures are identified. Electronically Signed   By: Burman Nieves M.D.   On: 06/19/2023 22:45    Procedures Procedures     Medications Ordered in ED Medications - No data to display  ED Course/ Medical Decision Making/ A&P                                 Medical Decision Making Amount and/or Complexity of Data Reviewed Labs: ordered. Radiology: ordered.   Differential diagnosis includes but is not limited to fracture, dislocation, intracranial hemorrhage, electrolyte abnormality  ED Course:  Patient overall well-appearing in no acute distress.  He has stable vital signs.  Patient able to sit up without difficulty in bed, no spinous process tenderness palpation, no tenderness palpation of the paraspinal muscles, pelvis bilaterally.  Per wife at bedside, no concern for saddle anesthesia, loss of bowel control, fever or chills at home.  CBC with no leukocytosis, hemoglobin at baseline.  CMP unremarkable.  Lumbar x-ray without any signs of fracture or dislocation, disc spaces well preserved.  CT head without any acute abnormalities.  Patient appropriate for discharge home at this time with close follow-up with PCP   Impression: Mechanical fall at home 1 week ago  Disposition:  The patient was discharged home with instructions to follow-up with PCP within the next week to ensure symptoms are improving. Return precautions given.  Lab Tests: I Ordered, and personally interpreted labs.  The pertinent results include:   CBC, CMP at patient baseline  Imaging Studies ordered: I ordered imaging studies including CT head, x-ray lumbar spine I independently visualized the imaging with scope of interpretation limited to determining acute life threatening conditions related to emergency care. Imaging showed no acute intracranial abnormalities, no fractures, dislocation of the lumbar spine. I agree with the radiologist interpretation            Final Clinical Impression(s) / ED Diagnoses Final diagnoses:  Accident due to mechanical fall without injury, initial encounter    Rx / DC Orders ED  Discharge Orders     None         Arabella Merles, Cordelia Poche 06/19/23 2331    Terrilee Files, MD 06/20/23 1001

## 2023-06-19 NOTE — Discharge Instructions (Addendum)
Your workup is overall reassuring today.  Your sodium and potassium are normal today.  Your chloride was slightly low at 93 (normal between 98 and 111), this is likely due to your diarrhea.  Your liver tests are normal today.  Your creatinine which is a measure of your kidney function is at your baseline.  Your hemoglobin which is a component of your red blood cells was 10.3 which seems to be at your baseline.  Your CT scan of the head showed no acute abnormalities.  The read is as below:  "IMPRESSION: No acute intracranial abnormalities. Chronic atrophy and small vessel ischemic changes. Old infarct in the right posterior frontal region."  Your x-ray of the spine has no signs of a fracture, dislocation, or herniated disc.  The radiology read is as below: "IMPRESSION: Mild degenerative changes in the lumbar spine. Normal alignment. No acute displaced fractures are identified."  Please follow-up with your primary care provider within the next week to ensure your symptoms are improving.  Return the ER if you have any severe worsening in your symptoms, loss of bowel control, fever, any other new or concerning symptoms.

## 2023-06-19 NOTE — ED Triage Notes (Signed)
Patient arrived via [facility] from home with complaint of GLF 1 week ago, No LOC.   Patient reports landing on lower back and complaining of lower back pain at this time.   Patient reported to EMS incont of bowel movements since fall and pain with bowel movements.   A & O x 4 Dialysis M/W/F

## 2023-06-19 NOTE — ED Notes (Signed)
Patient transported to CT 

## 2023-06-19 NOTE — ED Notes (Signed)
Patient transported to X-ray 

## 2023-06-20 DIAGNOSIS — R52 Pain, unspecified: Secondary | ICD-10-CM | POA: Diagnosis not present

## 2023-06-20 DIAGNOSIS — W19XXXA Unspecified fall, initial encounter: Secondary | ICD-10-CM | POA: Diagnosis not present

## 2023-06-21 DIAGNOSIS — N2581 Secondary hyperparathyroidism of renal origin: Secondary | ICD-10-CM | POA: Diagnosis not present

## 2023-06-21 DIAGNOSIS — D509 Iron deficiency anemia, unspecified: Secondary | ICD-10-CM | POA: Diagnosis not present

## 2023-06-21 DIAGNOSIS — E1129 Type 2 diabetes mellitus with other diabetic kidney complication: Secondary | ICD-10-CM | POA: Diagnosis not present

## 2023-06-21 DIAGNOSIS — Z992 Dependence on renal dialysis: Secondary | ICD-10-CM | POA: Diagnosis not present

## 2023-06-21 DIAGNOSIS — N186 End stage renal disease: Secondary | ICD-10-CM | POA: Diagnosis not present

## 2023-06-24 DIAGNOSIS — E1129 Type 2 diabetes mellitus with other diabetic kidney complication: Secondary | ICD-10-CM | POA: Diagnosis not present

## 2023-06-24 DIAGNOSIS — D509 Iron deficiency anemia, unspecified: Secondary | ICD-10-CM | POA: Diagnosis not present

## 2023-06-24 DIAGNOSIS — N2581 Secondary hyperparathyroidism of renal origin: Secondary | ICD-10-CM | POA: Diagnosis not present

## 2023-06-24 DIAGNOSIS — N186 End stage renal disease: Secondary | ICD-10-CM | POA: Diagnosis not present

## 2023-06-24 DIAGNOSIS — Z992 Dependence on renal dialysis: Secondary | ICD-10-CM | POA: Diagnosis not present

## 2023-06-26 DIAGNOSIS — D509 Iron deficiency anemia, unspecified: Secondary | ICD-10-CM | POA: Diagnosis not present

## 2023-06-26 DIAGNOSIS — E1129 Type 2 diabetes mellitus with other diabetic kidney complication: Secondary | ICD-10-CM | POA: Diagnosis not present

## 2023-06-26 DIAGNOSIS — F02818 Dementia in other diseases classified elsewhere, unspecified severity, with other behavioral disturbance: Secondary | ICD-10-CM | POA: Diagnosis not present

## 2023-06-26 DIAGNOSIS — N2581 Secondary hyperparathyroidism of renal origin: Secondary | ICD-10-CM | POA: Diagnosis not present

## 2023-06-26 DIAGNOSIS — N186 End stage renal disease: Secondary | ICD-10-CM | POA: Diagnosis not present

## 2023-06-26 DIAGNOSIS — F411 Generalized anxiety disorder: Secondary | ICD-10-CM | POA: Diagnosis not present

## 2023-06-26 DIAGNOSIS — Z992 Dependence on renal dialysis: Secondary | ICD-10-CM | POA: Diagnosis not present

## 2023-06-26 DIAGNOSIS — F6381 Intermittent explosive disorder: Secondary | ICD-10-CM | POA: Diagnosis not present

## 2023-06-26 DIAGNOSIS — F331 Major depressive disorder, recurrent, moderate: Secondary | ICD-10-CM | POA: Diagnosis not present

## 2023-06-28 DIAGNOSIS — D631 Anemia in chronic kidney disease: Secondary | ICD-10-CM | POA: Diagnosis not present

## 2023-06-28 DIAGNOSIS — T8620 Unspecified complication of heart transplant: Secondary | ICD-10-CM | POA: Diagnosis not present

## 2023-06-28 DIAGNOSIS — E1129 Type 2 diabetes mellitus with other diabetic kidney complication: Secondary | ICD-10-CM | POA: Diagnosis not present

## 2023-06-28 DIAGNOSIS — D509 Iron deficiency anemia, unspecified: Secondary | ICD-10-CM | POA: Diagnosis not present

## 2023-06-28 DIAGNOSIS — Z992 Dependence on renal dialysis: Secondary | ICD-10-CM | POA: Diagnosis not present

## 2023-06-28 DIAGNOSIS — N186 End stage renal disease: Secondary | ICD-10-CM | POA: Diagnosis not present

## 2023-06-28 DIAGNOSIS — N2581 Secondary hyperparathyroidism of renal origin: Secondary | ICD-10-CM | POA: Diagnosis not present

## 2023-07-01 DIAGNOSIS — Z992 Dependence on renal dialysis: Secondary | ICD-10-CM | POA: Diagnosis not present

## 2023-07-01 DIAGNOSIS — D509 Iron deficiency anemia, unspecified: Secondary | ICD-10-CM | POA: Diagnosis not present

## 2023-07-01 DIAGNOSIS — D631 Anemia in chronic kidney disease: Secondary | ICD-10-CM | POA: Diagnosis not present

## 2023-07-01 DIAGNOSIS — N186 End stage renal disease: Secondary | ICD-10-CM | POA: Diagnosis not present

## 2023-07-01 DIAGNOSIS — E1129 Type 2 diabetes mellitus with other diabetic kidney complication: Secondary | ICD-10-CM | POA: Diagnosis not present

## 2023-07-01 DIAGNOSIS — N2581 Secondary hyperparathyroidism of renal origin: Secondary | ICD-10-CM | POA: Diagnosis not present

## 2023-07-02 DIAGNOSIS — F03918 Unspecified dementia, unspecified severity, with other behavioral disturbance: Secondary | ICD-10-CM | POA: Diagnosis not present

## 2023-07-02 DIAGNOSIS — G63 Polyneuropathy in diseases classified elsewhere: Secondary | ICD-10-CM | POA: Diagnosis not present

## 2023-07-02 DIAGNOSIS — E1140 Type 2 diabetes mellitus with diabetic neuropathy, unspecified: Secondary | ICD-10-CM | POA: Diagnosis not present

## 2023-07-02 DIAGNOSIS — G251 Drug-induced tremor: Secondary | ICD-10-CM | POA: Diagnosis not present

## 2023-07-02 NOTE — Progress Notes (Incomplete)
PCP - Merri Brunette, MD  Cardiologist -   PPM/ICD - denies  Pt has heart transplant  Chest x-ray - 11/29/22 EKG - 11/29/22 Stress Test - 12/08/19 ECHO - 04/18/23 Cardiac Cath - 05/03/20  CPAP - denies  Fasting Blood Sugar -  Checks Blood Sugar  /day  Blood Thinner Instructions: Eliquis- hold 7 days  Aspirin Instructions: n/a  ERAS Protcol - NPO  COVID TEST- n/a  Anesthesia review: yes, cardiac hx   Patient verbally denies any shortness of breath, fever, cough and chest pain during phone call   -------------  SDW INSTRUCTIONS given:  Your procedure is scheduled on 11/7.  Report to [facility] Main Entrance "A" at 0640 A.M., and check in at the Admitting office.  Call this number if you have problems the morning of surgery:  [phone]   Remember:  Do not eat or drink after midnight the night before your surgery     Take these medicines the morning of surgery with A SIP OF WATER  Tylenol Abilify Depakote Duloxetine Hydroxyzine Midodrine Myfortic Lyrica Crestor If needed: zofran, percocet  As of today, STOP taking any Aspirin (unless otherwise instructed by your surgeon) Aleve, Naproxen, Ibuprofen, Motrin, Advil, Goody's, BC's, all herbal medications, fish oil, and all vitamins.                      Do not wear jewelry, make up, or nail polish            Do not wear lotions, powders, perfumes/colognes, or deodorant.            Do not shave 48 hours prior to surgery.  Men may shave face and neck.            Do not bring valuables to the hospital.            [facility] is not responsible for any belongings or valuables.  Do NOT Smoke (Tobacco/Vaping) 24 hours prior to your procedure If you use a CPAP at night, you may bring all equipment for your overnight stay.   Contacts, glasses, dentures or bridgework may not be worn into surgery.      For patients admitted to the hospital, discharge time will be determined by your treatment team.   Patients discharged  the day of surgery will not be allowed to drive home, and someone needs to stay with them for 24 hours.    Special instructions:   [place]- Preparing For Surgery  Before surgery, you can play an important role. Because skin is not sterile, your skin needs to be as free of germs as possible. You can reduce the number of germs on your skin by washing with CHG (chlorahexidine gluconate) Soap before surgery.  CHG is an antiseptic cleaner which kills germs and bonds with the skin to continue killing germs even after washing.    Oral Hygiene is also important to reduce your risk of infection.  Remember - BRUSH YOUR TEETH THE MORNING OF SURGERY WITH YOUR REGULAR TOOTHPASTE  Please do not use if you have an allergy to CHG or antibacterial soaps. If your skin becomes reddened/irritated stop using the CHG.  Do not shave (including legs and underarms) for at least 48 hours prior to first CHG shower. It is OK to shave your face.  Please follow these instructions carefully.   Shower the NIGHT BEFORE SURGERY and the MORNING OF SURGERY with DIAL Soap.   Pat yourself dry with a CLEAN  TOWEL.  Wear CLEAN PAJAMAS to bed the night before surgery  Place CLEAN SHEETS on your bed the night of your first shower and DO NOT SLEEP WITH PETS.   Day of Surgery: Please shower morning of surgery  Wear Clean/Comfortable clothing the morning of surgery Do not apply any deodorants/lotions.   Remember to brush your teeth WITH YOUR REGULAR TOOTHPASTE.   Questions were answered. Patient verbalized understanding of instructions.

## 2023-07-03 ENCOUNTER — Other Ambulatory Visit: Payer: Self-pay

## 2023-07-03 ENCOUNTER — Encounter (HOSPITAL_COMMUNITY): Payer: Self-pay | Admitting: Vascular Surgery

## 2023-07-03 DIAGNOSIS — Z992 Dependence on renal dialysis: Secondary | ICD-10-CM | POA: Diagnosis not present

## 2023-07-03 DIAGNOSIS — N186 End stage renal disease: Secondary | ICD-10-CM | POA: Diagnosis not present

## 2023-07-03 DIAGNOSIS — D631 Anemia in chronic kidney disease: Secondary | ICD-10-CM | POA: Diagnosis not present

## 2023-07-03 DIAGNOSIS — E1129 Type 2 diabetes mellitus with other diabetic kidney complication: Secondary | ICD-10-CM | POA: Diagnosis not present

## 2023-07-03 DIAGNOSIS — N2581 Secondary hyperparathyroidism of renal origin: Secondary | ICD-10-CM | POA: Diagnosis not present

## 2023-07-03 DIAGNOSIS — D509 Iron deficiency anemia, unspecified: Secondary | ICD-10-CM | POA: Diagnosis not present

## 2023-07-03 NOTE — Progress Notes (Signed)
PCP - Merri Brunette, MD  Cardiologist - [facility] (Dr. Allena Katz per patient wife) Neurology Moreen Fowler, MD  (follows patient for dementia)  PPM/ICD - denies Device Orders - n/a Rep Notified - n/a  Chest x-ray - 11-29-22 EKG - 11-29-22 Stress Test - 12-08-19 ECHO - 03-2023 Cardiac Cath - 05-03-20  CPAP - Denies ( had weight loss surgery in past) DM - denies ( had weight loss surgery in past)  Blood Thinner Instructions: ELIQUIS 2.5 LAST DOSE 07-01-23 Aspirin Instructions: n/a  ERAS Protcol - NPO  COVID TEST- n/a  Anesthesia review: yes Hx, of HTN, MI, heart transplant, LBBB, ESRD  Patient verbally denies any shortness of breath, fever, cough and chest pain during phone call   -------------  SDW INSTRUCTIONS given:  Your procedure is scheduled on July 04, 2023.  Report to [facility] Main Entrance "A" at 6:40 A.M., and check in at the Admitting office.  Call this number if you have problems the morning of surgery:  [phone]   Remember:  Do not eat or drink after midnight the night before your surgery     Take these medicines the morning of surgery with A SIP OF WATER  cycloSPORINE modified (NEORAL)  ARIPiprazole (ABILIFY)  divalproex (DEPAKOTE)  DULoxetine (CYMBALTA)  midodrine (PROAMATINE)  hydrOXYzine (ATARAX)  mycophenolate (MYFORTIC)  pregabalin (LYRICA)     IF NEEDED acetaminophen (TYLENOL)  ondansetron (ZOFRAN)  oxyCODONE-acetaminophen (PERCOCET)   As of today, STOP taking any Aspirin (unless otherwise instructed by your surgeon) Aleve, Naproxen, Ibuprofen, Motrin, Advil, Goody's, BC's, all herbal medications, fish oil, and all vitamins.                      Do not wear jewelry, make up, or nail polish            Do not wear lotions, powders, perfumes/colognes, or deodorant.            Do not shave 48 hours prior to surgery.  Men may shave face and neck.            Do not bring valuables to the hospital.            [facility] is not responsible for any belongings or valuables.  Do NOT Smoke (Tobacco/Vaping) 24 hours prior to your procedure If you use a CPAP at night, you may bring all equipment for your overnight stay.   Contacts, glasses, dentures or bridgework may not be worn into surgery.      For patients admitted to the hospital, discharge time will be determined by your treatment team.   Patients discharged the day of surgery will not be allowed to drive home, and someone needs to stay with them for 24 hours.    Special instructions:   [place]- Preparing For Surgery  Before surgery, you can play an important role. Because skin is not sterile, your skin needs to be as free of germs as possible. You can reduce the number of germs on your skin by washing with CHG (chlorahexidine gluconate) Soap before surgery.  CHG is an antiseptic cleaner which kills germs and bonds with the skin to continue killing germs even after washing.    Oral Hygiene is also important to reduce your risk of infection.  Remember - BRUSH YOUR TEETH THE MORNING OF SURGERY WITH YOUR REGULAR TOOTHPASTE  Please do not use if you have an allergy to CHG or antibacterial soaps. If your skin becomes reddened/irritated  stop using the CHG.  Do not shave (including legs and underarms) for at least 48 hours prior to first CHG shower. It is OK to shave your face.  Please follow these instructions carefully.   Shower the NIGHT BEFORE SURGERY and the MORNING OF SURGERY with DIAL Soap.   Pat yourself dry with a CLEAN TOWEL.  Wear CLEAN PAJAMAS to bed the night before surgery  Place CLEAN SHEETS on your bed the night of your first shower and DO NOT SLEEP WITH PETS.   Day of Surgery: Please shower morning of surgery  Wear Clean/Comfortable clothing the morning of surgery Do not apply any deodorants/lotions.   Remember to brush your teeth WITH YOUR REGULAR TOOTHPASTE.   Questions were answered. Patient verbalized understanding of  instructions.

## 2023-07-03 NOTE — Progress Notes (Signed)
Anesthesia Chart Review: SAME DAY WORK-UP  Case: [id number] Date/Time: 07/04/23 0855   Procedures:      INSERTION OF DIALYSIS CATHETER     INSERTION  OF ARTERIOVENOUS (AV) GORE-TEX GRAFT RIGHT ARM (Right)   Anesthesia type: Choice   Pre-op diagnosis: ESRD   [facility]: [facility] ROOM 11 / [facility]   Surgeons: Maeola Harman, MD       DISCUSSION: Patient is a 62 year old male scheduled for the above procedure. S/p right arm fistulogram with balloon angioplasty of previous Viabahn stent on 05/23/2023 for a radiocephalic fistula with prolonged bleeding events and superficial ulcerations. Unfortunately, ulceration did not improve and is at risk for spontaneous bleeding. The above procedure recommended.   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 Mechanical fall without LOC on 06/19/23.   Last visit at the [facility] was on 04/18/23 with Dr. Purvis Sheffield. He had pneumonia in April. Dementia and generalized weakness felt to be more progressive. Otherwise felt to be stable. He was tolerating hemodialysis and immunosuppressant therapy. Clinically, no evidence of allograft dysfunction. 04/18/23 echo showed preserved LVF, mild LVH, mild RV systolic dysfunction, trivial MR, mild PR/TR. CCTA with FFR on 04/26/22 showed elevated CAC 5232 (90th percentile), diffuse small vessels, severe, extensive calcified plaques including 70% pLCX, 50% mLAD, 70% dLAD, up to 50% proximal and distal RCA but FFR showed no flow limiting stenoses of the LAD and LCX, unable to calculate FFR on RCA due to technical limitations. He  was referred for brain MRI, neurocognitive testing and neurology movement clinic. 05/21/23 brain MRI suggested mixed etiology for memory loss including vascular and neurodegenerative, chronic right infarct, and incidental at least moderate spinal canal stenosis at C2-3.   Last Eliquis reported as 07/01/23.  Anesthesia team to evaluate on the day of surgery.   VS: Ht 6\' 6"  (1.981 m)   BMI 29.30 kg/m  Wt Readings from Last 3 Encounters:  12/05/22 115 kg  11/01/22 (!) 140.6 kg  07/06/22 (!) 140 kg   Temp Readings from Last 3 Encounters:  06/20/23 36.9 C (Oral)  06/12/23 36.8 C  05/09/23 36.7 C   BP Readings from Last 3 Encounters:  06/20/23 95/66  06/12/23 (!) 86/59  05/23/23 103/77   Pulse Readings from Last 3 Encounters:  06/20/23 88  06/12/23 82  05/23/23 87    PROVIDERS: Merri Brunette, MD is PCP Deboraha Sprang at [facility]) - Moreen Fowler, MD is neurologist [facility]). Visit on 07/02/23. He was referred to memory clinic for multidisciplinary care, as well as palliative care/pain clinic. - Florestine Avers, MD is Transplant cardiologist [facility])   LABS: For day of surgery. H/H 10.3/32.5, PLT 200, glucose 87 on 06/19/23.    IMAGES: CT Head 06/19/23: IMPRESSION: No acute intracranial abnormalities. Chronic atrophy and small vessel ischemic changes. Old infarct in the right posterior frontal region.  MRI Brain 05/21/23 ([facility]): IMPRESSION:  1.  Findings suggest mixed etiology for memory loss including post  treatment, vascular, neurodegenerative (AD > FTLD) and CAA etiologies.  2.  Chronic cortical infarct involving the right precentral gyrus.  3.  Incompletely visualized multilevel degenerative disc disease within the  cervical spine with at least moderate spinal canal stenosis at C2-C3.  Consider cervical spine MRI if patient is symptomatic.   CT Chest 29/29/24 ([facility]): IMPRESSION:  1.  Interval resolution of the previously noted left upper lobe predominant   tree-in-bud nodularity.  2.  Similar bibasilar atelectasis/scarring.  3.  Unchanged marked dilation of the pulmonary artery.    EKG: 11/29/22: Sinus rhythm RBBB and LAFB no significant change since Nov 2023 Confirmed by Pricilla Loveless [phone]) on 11/29/2022 10:48:45 AM   CV: Echo 04/18/23 ([facility]): NORMAL LEFT VENTRICULAR SYSTOLIC FUNCTION WITH MILD LVH  MILD RV SYSTOLIC DYSFUNCTION (See above)  VALVULAR REGURGITATION: TRIVIAL MR, MILD PR, MILD TR  NO VALVULAR STENOSIS  - Compared with prior Echo study on 10/25/2022: RVSP IS HIGHER TODAY ( 35 mmHg VS PRIOR = 22 mmHg )   CT Heart Angiogram with FFRCT 04/26/22 ([facility]): IMPRESSION:  1. Total calcium score of 5232 is above the 90th percentile for men between the ages of 16 and 30.  2. Diffuse small vessels despite administration of sublingual nitroglycerin.  3. Severe, extensive calcified plaque in the LAD, RCA and LCx resulting in multifocal stenoses, including 70% narrowing at the proximal LCx, up to 50% stenosis of the mid and 70% stenosis of the distal LAD and multifocal up to 50% stenosis in the proximal and distal RCA.  4. CTFFR Analysis: CTA is being sent for CTFFR and will be reported under a separate report.  FFR IMPRESSION: 1. No flow limiting stenosis in the LAD or LCx.  2.  Please note, FFR unable to be calculated in the right coronary artery  system due to technical limitations.   Cardiac cath 05/03/20 ([facility]): IMPRESSION: 1. Non-obstructive CAD  2. Normal filling pressures  3. No pulmonary HTN  4. CO 9.4 L/min, CI 3.6 L/min/m2  5. LVEDP  6. No aortic stenosis    Past Medical History:  Diagnosis Date   Anxiety    Arthritis    Atrial fibrillation (HCC)    CHF (congestive heart failure), NYHA class III (HCC)    s/p heart transplant   Chronic kidney disease    end stage   Chronic systolic dysfunction of left ventricle    CVA (cerebral infarction)    Dementia (HCC)    Depression    Diabetes mellitus     PMH; Prior to heart transplant   Family history of coronary artery disease    in both parents   GERD (gastroesophageal reflux disease)    Hyperlipidemia    Hypertension    Left bundle branch block    Morbid obesity (HCC)    status post lap band   Myocardial infarction [facility])    prior to heart transplant   Nonischemic cardiomyopathy (HCC)    prior to heart transplant   Obesity (BMI 30-39.9)    Obstructive sleep apnea    no longer needs CPAP after heart transplant per pt   Premature ventricular contractions    Prostate cancer (HCC)    SOB (shortness of breath)     Past Surgical History:  Procedure Laterality Date   A/V FISTULAGRAM Right 05/23/2023   Procedure: A/V Fistulagram;  Surgeon: Victorino Sparrow, MD;  Location: [facility] INVASIVE CV LAB;  Service: Cardiovascular;  Laterality: Right;   CARDIAC PACEMAKER PLACEMENT  09/21/2009   Biventricular implantable cardioverter-defibrillator implantation      COLONOSCOPY W/ BIOPSIES AND POLYPECTOMY     CYSTOSCOPY WITH FULGERATION N/A 04/27/2021   Procedure: CYSTOSCOPY AND CLOT EVACUATION WITH BLADDER BIOPSY/ FUGARATION OF BLADDER/ FULGARATION OF PROSTATE ;  Surgeon: Jerilee Field, MD;  Location: [facility] ORS;  Service: Urology;  Laterality: N/A;   FOOT SURGERY     left   HEART TRANSPLANT  2014   LAPAROSCOPIC GASTRIC BANDING  01/27/2007   LEFT VENTRICULAR ASSIST DEVICE     implanted at [facility]   PACEMAKER REMOVAL     PROSTATE BIOPSY     TOTAL HIP ARTHROPLASTY Right 07/22/2017   Procedure: RIGHT TOTAL HIP ARTHROPLASTY ANTERIOR APPROACH;  Surgeon: Samson Frederic, MD;  Location: [facility];  Service: Orthopedics;  Laterality: Right;  Needs RNFA   TOTAL KNEE ARTHROPLASTY Right 07/22/2017    MEDICATIONS:  0.9 %  sodium chloride infusion   sodium chloride flush (NS) 0.9 % injection 3 mL    acetaminophen (TYLENOL) 500 MG tablet   ARIPiprazole (ABILIFY) 10 MG tablet   cycloSPORINE modified (NEORAL) 100 MG capsule   cycloSPORINE modified 25 MG CAPS    divalproex (DEPAKOTE) 125 MG DR tablet   DULoxetine (CYMBALTA) 60 MG capsule   ELIQUIS 2.5 MG TABS tablet   hydrocortisone (ANUSOL-HC) 2.5 % rectal cream   hydrOXYzine (ATARAX) 25 MG tablet   ibuprofen (ADVIL) 200 MG tablet   midodrine (PROAMATINE) 10 MG tablet   mycophenolate (MYFORTIC) 180 MG EC tablet   naloxone (NARCAN) nasal spray 4 mg/0.1 mL   ondansetron (ZOFRAN) 4 MG tablet   oxyCODONE-acetaminophen (PERCOCET) 10-325 MG tablet   pregabalin (LYRICA) 50 MG capsule   rosuvastatin (CRESTOR) 10 MG tablet   sevelamer carbonate (RENVELA) 800 MG tablet   sucroferric oxyhydroxide (VELPHORO) 500 MG chewable tablet   traZODone (DESYREL) 100 MG tablet    Shonna Chock, PA-C Surgical Short Stay/Anesthesiology [facility] Phone [phone] [facility] Phone [phone] 07/03/2023 12:51 PM

## 2023-07-03 NOTE — Anesthesia Preprocedure Evaluation (Signed)
Anesthesia Evaluation  Patient identified by MRN, date of birth, ID band Patient awake    Reviewed: Allergy & Precautions, NPO status , Patient's Chart, lab work & pertinent test results  Airway Mallampati: III  TM Distance: >3 FB Neck ROM: Full    Dental  (+) Teeth Intact, Dental Advisory Given   Pulmonary sleep apnea    Pulmonary exam normal breath sounds clear to auscultation       Cardiovascular hypertension, + Past MI and +CHF  Normal cardiovascular exam+ dysrhythmias (LBBB) Atrial Fibrillation  Rhythm:Regular Rate:Normal  S/p heart transplant 2014     Neuro/Psych  PSYCHIATRIC DISORDERS Anxiety Depression   Dementia  Neuromuscular disease CVA    GI/Hepatic Neg liver ROS,GERD  ,,S/p lap band    Endo/Other  diabetes  Obesity   Renal/GU ESRF and DialysisRenal disease     Musculoskeletal  (+) Arthritis , Osteoarthritis,    Abdominal   Peds  Hematology  (+) Blood dyscrasia (Eliquis)   Anesthesia Other Findings Day of surgery medications reviewed with the patient.  Reproductive/Obstetrics                              Anesthesia Physical Anesthesia Plan  ASA: 4  Anesthesia Plan: General   Post-op Pain Management: Tylenol PO (pre-op)*   Induction: Intravenous  PONV Risk Score and Plan: 2 and Dexamethasone and Ondansetron  Airway Management Planned: LMA  Additional Equipment:   Intra-op Plan:   Post-operative Plan: Extubation in OR  Informed Consent: I have reviewed the patients History and Physical, chart, labs and discussed the procedure including the risks, benefits and alternatives for the proposed anesthesia with the patient or authorized representative who has indicated his/her understanding and acceptance.     Dental advisory given  Plan Discussed with: CRNA  Anesthesia Plan Comments: (PAT note written 07/03/2023 by Shonna Chock, PA-C.  )         Anesthesia Quick Evaluation

## 2023-07-04 ENCOUNTER — Ambulatory Visit (HOSPITAL_COMMUNITY): Payer: Medicare Other

## 2023-07-04 ENCOUNTER — Ambulatory Visit (HOSPITAL_BASED_OUTPATIENT_CLINIC_OR_DEPARTMENT_OTHER): Payer: Medicare Other | Admitting: Vascular Surgery

## 2023-07-04 ENCOUNTER — Other Ambulatory Visit: Payer: Self-pay

## 2023-07-04 ENCOUNTER — Ambulatory Visit (HOSPITAL_COMMUNITY): Payer: Self-pay | Admitting: Vascular Surgery

## 2023-07-04 ENCOUNTER — Encounter (HOSPITAL_COMMUNITY): Payer: Self-pay | Admitting: Vascular Surgery

## 2023-07-04 ENCOUNTER — Encounter (HOSPITAL_COMMUNITY)
Admission: RE | Disposition: A | Discharge status: Home / Self Care | Payer: Self-pay | Source: Home / Self Care | Attending: Vascular Surgery

## 2023-07-04 ENCOUNTER — Ambulatory Visit (HOSPITAL_COMMUNITY)
Admission: RE | Admit: 2023-07-04 | Discharge: 2023-07-04 | Disposition: A | Discharge status: Home / Self Care | Payer: Medicare Other | Attending: Vascular Surgery | Admitting: Vascular Surgery

## 2023-07-04 DIAGNOSIS — Y718 Miscellaneous cardiovascular devices associated with adverse incidents, not elsewhere classified: Secondary | ICD-10-CM | POA: Insufficient documentation

## 2023-07-04 DIAGNOSIS — I509 Heart failure, unspecified: Secondary | ICD-10-CM | POA: Diagnosis not present

## 2023-07-04 DIAGNOSIS — G709 Myoneural disorder, unspecified: Secondary | ICD-10-CM | POA: Insufficient documentation

## 2023-07-04 DIAGNOSIS — Z6836 Body mass index (BMI) 36.0-36.9, adult: Secondary | ICD-10-CM | POA: Insufficient documentation

## 2023-07-04 DIAGNOSIS — E1122 Type 2 diabetes mellitus with diabetic chronic kidney disease: Secondary | ICD-10-CM | POA: Insufficient documentation

## 2023-07-04 DIAGNOSIS — I252 Old myocardial infarction: Secondary | ICD-10-CM | POA: Diagnosis not present

## 2023-07-04 DIAGNOSIS — I4891 Unspecified atrial fibrillation: Secondary | ICD-10-CM | POA: Diagnosis not present

## 2023-07-04 DIAGNOSIS — Z992 Dependence on renal dialysis: Secondary | ICD-10-CM | POA: Diagnosis not present

## 2023-07-04 DIAGNOSIS — F32A Depression, unspecified: Secondary | ICD-10-CM | POA: Diagnosis not present

## 2023-07-04 DIAGNOSIS — Z941 Heart transplant status: Secondary | ICD-10-CM | POA: Insufficient documentation

## 2023-07-04 DIAGNOSIS — Z8673 Personal history of transient ischemic attack (TIA), and cerebral infarction without residual deficits: Secondary | ICD-10-CM | POA: Diagnosis not present

## 2023-07-04 DIAGNOSIS — N186 End stage renal disease: Secondary | ICD-10-CM

## 2023-07-04 DIAGNOSIS — J811 Chronic pulmonary edema: Secondary | ICD-10-CM | POA: Diagnosis not present

## 2023-07-04 DIAGNOSIS — I132 Hypertensive heart and chronic kidney disease with heart failure and with stage 5 chronic kidney disease, or end stage renal disease: Secondary | ICD-10-CM

## 2023-07-04 DIAGNOSIS — I447 Left bundle-branch block, unspecified: Secondary | ICD-10-CM | POA: Insufficient documentation

## 2023-07-04 DIAGNOSIS — F0394 Unspecified dementia, unspecified severity, with anxiety: Secondary | ICD-10-CM | POA: Diagnosis not present

## 2023-07-04 DIAGNOSIS — G4730 Sleep apnea, unspecified: Secondary | ICD-10-CM | POA: Insufficient documentation

## 2023-07-04 DIAGNOSIS — R0989 Other specified symptoms and signs involving the circulatory and respiratory systems: Secondary | ICD-10-CM | POA: Diagnosis not present

## 2023-07-04 DIAGNOSIS — M1990 Unspecified osteoarthritis, unspecified site: Secondary | ICD-10-CM | POA: Insufficient documentation

## 2023-07-04 DIAGNOSIS — E669 Obesity, unspecified: Secondary | ICD-10-CM | POA: Insufficient documentation

## 2023-07-04 DIAGNOSIS — Z9884 Bariatric surgery status: Secondary | ICD-10-CM | POA: Diagnosis not present

## 2023-07-04 DIAGNOSIS — Z955 Presence of coronary angioplasty implant and graft: Secondary | ICD-10-CM | POA: Diagnosis not present

## 2023-07-04 DIAGNOSIS — T82590A Other mechanical complication of surgically created arteriovenous fistula, initial encounter: Secondary | ICD-10-CM | POA: Insufficient documentation

## 2023-07-04 DIAGNOSIS — T82898A Other specified complication of vascular prosthetic devices, implants and grafts, initial encounter: Secondary | ICD-10-CM

## 2023-07-04 DIAGNOSIS — I517 Cardiomegaly: Secondary | ICD-10-CM | POA: Diagnosis not present

## 2023-07-04 HISTORY — DX: Unspecified dementia, unspecified severity, without behavioral disturbance, psychotic disturbance, mood disturbance, and anxiety: F03.90

## 2023-07-04 HISTORY — PX: INSERTION OF DIALYSIS CATHETER: SHX1324

## 2023-07-04 HISTORY — PX: AV FISTULA PLACEMENT: SHX1204

## 2023-07-04 HISTORY — DX: Anxiety disorder, unspecified: F41.9

## 2023-07-04 HISTORY — DX: Depression, unspecified: F32.A

## 2023-07-04 LAB — POCT I-STAT, CHEM 8
BUN: 21 mg/dL (ref 8–23)
Calcium, Ion: 1.16 mmol/L (ref 1.15–1.40)
Chloride: 96 mmol/L — ABNORMAL LOW (ref 98–111)
Creatinine, Ser: 6.6 mg/dL — ABNORMAL HIGH (ref 0.61–1.24)
Glucose, Bld: 75 mg/dL (ref 70–99)
HCT: 36 % — ABNORMAL LOW (ref 39.0–52.0)
Hemoglobin: 12.2 g/dL — ABNORMAL LOW (ref 13.0–17.0)
Potassium: 3.9 mmol/L (ref 3.5–5.1)
Sodium: 137 mmol/L (ref 135–145)
TCO2: 32 mmol/L (ref 22–32)

## 2023-07-04 LAB — GLUCOSE, CAPILLARY: Glucose-Capillary: 73 mg/dL (ref 70–99)

## 2023-07-04 SURGERY — INSERTION OF DIALYSIS CATHETER
Anesthesia: General | Site: Chest | Laterality: Right

## 2023-07-04 MED ORDER — ONDANSETRON HCL 4 MG/2ML IJ SOLN
INTRAMUSCULAR | Status: AC
  Filled 2023-07-04: qty 2

## 2023-07-04 MED ORDER — SODIUM CHLORIDE 0.9 % IV SOLN: INTRAVENOUS | Status: DC

## 2023-07-04 MED ORDER — ORAL CARE MOUTH RINSE: 15.0000 mL | Freq: Once | OROMUCOSAL | Status: AC

## 2023-07-04 MED ORDER — PROPOFOL 10 MG/ML IV BOLUS
INTRAVENOUS | Status: AC
  Filled 2023-07-04: qty 20

## 2023-07-04 MED ORDER — ONDANSETRON HCL 4 MG/2ML IJ SOLN: 4.0000 mg | Freq: Once | INTRAMUSCULAR | Status: DC | PRN

## 2023-07-04 MED ORDER — MIDAZOLAM HCL 2 MG/2ML IJ SOLN
INTRAMUSCULAR | Status: AC
  Filled 2023-07-04: qty 2

## 2023-07-04 MED ORDER — FENTANYL CITRATE (PF) 100 MCG/2ML IJ SOLN: 25.0000 ug | INTRAMUSCULAR | Status: DC | PRN

## 2023-07-04 MED ORDER — PHENYLEPHRINE 80 MCG/ML (10ML) SYRINGE FOR IV PUSH (FOR BLOOD PRESSURE SUPPORT)
PREFILLED_SYRINGE | INTRAVENOUS | Status: DC | PRN
  Administered 2023-07-04: 120 ug via INTRAVENOUS
  Administered 2023-07-04 (×2): 160 ug via INTRAVENOUS
  Administered 2023-07-04: 120 ug via INTRAVENOUS
  Administered 2023-07-04: 80 ug via INTRAVENOUS
  Administered 2023-07-04: 160 ug via INTRAVENOUS

## 2023-07-04 MED ORDER — 0.9 % SODIUM CHLORIDE (POUR BTL) OPTIME
TOPICAL | Status: DC | PRN
  Administered 2023-07-04: 1000 mL

## 2023-07-04 MED ORDER — FENTANYL CITRATE (PF) 250 MCG/5ML IJ SOLN
INTRAMUSCULAR | Status: AC
  Filled 2023-07-04: qty 5

## 2023-07-04 MED ORDER — CIPROFLOXACIN IN D5W 400 MG/200ML IV SOLN
INTRAVENOUS | Status: AC
  Filled 2023-07-04: qty 200

## 2023-07-04 MED ORDER — OXYCODONE-ACETAMINOPHEN 5-325 MG PO TABS: 1.0000 | ORAL_TABLET | Freq: Four times a day (QID) | ORAL | 0 refills | Status: DC | PRN

## 2023-07-04 MED ORDER — SUCCINYLCHOLINE CHLORIDE 200 MG/10ML IV SOSY
PREFILLED_SYRINGE | INTRAVENOUS | Status: DC | PRN
  Administered 2023-07-04: 100 mg via INTRAVENOUS

## 2023-07-04 MED ORDER — CHLORHEXIDINE GLUCONATE 0.12 % MT SOLN
OROMUCOSAL | Status: AC
  Administered 2023-07-04: 15 mL via OROMUCOSAL
  Filled 2023-07-04: qty 15

## 2023-07-04 MED ORDER — PROPOFOL 10 MG/ML IV BOLUS
INTRAVENOUS | Status: DC | PRN
  Administered 2023-07-04 (×2): 50 mg via INTRAVENOUS

## 2023-07-04 MED ORDER — LIDOCAINE 2% (20 MG/ML) 5 ML SYRINGE
INTRAMUSCULAR | Status: AC
  Filled 2023-07-04: qty 5

## 2023-07-04 MED ORDER — FENTANYL CITRATE (PF) 250 MCG/5ML IJ SOLN
INTRAMUSCULAR | Status: DC | PRN
  Administered 2023-07-04: 100 ug via INTRAVENOUS
  Administered 2023-07-04: 50 ug via INTRAVENOUS

## 2023-07-04 MED ORDER — EPHEDRINE 5 MG/ML INJ
INTRAVENOUS | Status: AC
  Filled 2023-07-04: qty 5

## 2023-07-04 MED ORDER — CHLORHEXIDINE GLUCONATE 4 % EX SOLN: 60.0000 mL | Freq: Once | CUTANEOUS | Status: DC

## 2023-07-04 MED ORDER — HEPARIN SODIUM (PORCINE) 1000 UNIT/ML IJ SOLN
INTRAMUSCULAR | Status: AC
  Filled 2023-07-04: qty 10

## 2023-07-04 MED ORDER — ONDANSETRON HCL 4 MG/2ML IJ SOLN
INTRAMUSCULAR | Status: DC | PRN
  Administered 2023-07-04: 4 mg via INTRAVENOUS

## 2023-07-04 MED ORDER — HEPARIN 6000 UNIT IRRIGATION SOLUTION
Status: AC
  Filled 2023-07-04: qty 500

## 2023-07-04 MED ORDER — VASOPRESSIN 20 UNIT/ML IV SOLN
INTRAVENOUS | Status: DC | PRN
  Administered 2023-07-04: .5 [IU] via INTRAVENOUS
  Administered 2023-07-04: 1 [IU] via INTRAVENOUS
  Administered 2023-07-04 (×2): .5 [IU] via INTRAVENOUS

## 2023-07-04 MED ORDER — ACD FORMULA A 0.73-2.45-2.2 GM/100ML VI SOLN
Status: AC | PRN
  Administered 2023-07-04: 3.2 mL

## 2023-07-04 MED ORDER — LIDOCAINE 2% (20 MG/ML) 5 ML SYRINGE
INTRAMUSCULAR | Status: DC | PRN
  Administered 2023-07-04: 100 mg via INTRAVENOUS

## 2023-07-04 MED ORDER — CHLORHEXIDINE GLUCONATE 0.12 % MT SOLN: 15.0000 mL | Freq: Once | OROMUCOSAL | Status: AC

## 2023-07-04 MED ORDER — VASOPRESSIN 20 UNIT/ML IV SOLN
INTRAVENOUS | Status: AC
  Filled 2023-07-04: qty 1

## 2023-07-04 MED ORDER — PHENYLEPHRINE 80 MCG/ML (10ML) SYRINGE FOR IV PUSH (FOR BLOOD PRESSURE SUPPORT)
PREFILLED_SYRINGE | INTRAVENOUS | Status: AC
  Filled 2023-07-04: qty 10

## 2023-07-04 MED ORDER — ACETAMINOPHEN 500 MG PO TABS
1000.0000 mg | ORAL_TABLET | Freq: Once | ORAL | Status: AC
  Administered 2023-07-04: 1000 mg via ORAL
  Filled 2023-07-04: qty 2

## 2023-07-04 MED ORDER — EPHEDRINE SULFATE-NACL 50-0.9 MG/10ML-% IV SOSY
PREFILLED_SYRINGE | INTRAVENOUS | Status: DC | PRN
  Administered 2023-07-04 (×2): 5 mg via INTRAVENOUS
  Administered 2023-07-04: 10 mg via INTRAVENOUS
  Administered 2023-07-04: 5 mg via INTRAVENOUS

## 2023-07-04 MED ORDER — DEXMEDETOMIDINE HCL IN NACL 80 MCG/20ML IV SOLN
INTRAVENOUS | Status: AC
  Filled 2023-07-04: qty 20

## 2023-07-04 MED ORDER — CIPROFLOXACIN IN D5W 400 MG/200ML IV SOLN
400.0000 mg | INTRAVENOUS | Status: AC
Start: 2023-07-04 — End: 2023-07-04
  Administered 2023-07-04: 400 mg via INTRAVENOUS

## 2023-07-04 MED ORDER — STERILE WATER FOR IRRIGATION IR SOLN
Status: DC | PRN
  Administered 2023-07-04: 1000 mL

## 2023-07-04 SURGICAL SUPPLY — 48 items
ADH SKN CLS APL DERMABOND .7 (GAUZE/BANDAGES/DRESSINGS) ×4
ARMBAND PINK RESTRICT EXTREMIT (MISCELLANEOUS) ×6 IMPLANT
BAG COUNTER SPONGE SURGICOUNT (BAG) ×3 IMPLANT
BAG DECANTER FOR FLEXI CONT (MISCELLANEOUS) ×3 IMPLANT
BAG SPNG CNTER NS LX DISP (BAG) ×2
BIOPATCH RED 1 DISK 7.0 (GAUZE/BANDAGES/DRESSINGS) ×3 IMPLANT
CANISTER SUCT 3000ML PPV (MISCELLANEOUS) ×3 IMPLANT
CATH PALINDROME-P 19CM W/VT (CATHETERS) IMPLANT
CLIP LIGATING EXTRA MED SLVR (CLIP) ×3 IMPLANT
CLIP LIGATING EXTRA SM BLUE (MISCELLANEOUS) ×3 IMPLANT
COVER PROBE W GEL 5X96 (DRAPES) ×3 IMPLANT
COVER SURGICAL LIGHT HANDLE (MISCELLANEOUS) ×3 IMPLANT
DERMABOND ADVANCED .7 DNX12 (GAUZE/BANDAGES/DRESSINGS) ×3 IMPLANT
DRAPE C-ARM 42X72 X-RAY (DRAPES) ×3 IMPLANT
ELECT REM PT RETURN 9FT ADLT (ELECTROSURGICAL) ×2
ELECTRODE REM PT RTRN 9FT ADLT (ELECTROSURGICAL) ×3 IMPLANT
GAUZE 4X4 16PLY ~~LOC~~+RFID DBL (SPONGE) ×3 IMPLANT
GLOVE BIO SURGEON STRL SZ7.5 (GLOVE) ×3 IMPLANT
GOWN STRL REUS W/ TWL LRG LVL3 (GOWN DISPOSABLE) ×6 IMPLANT
GOWN STRL REUS W/ TWL XL LVL3 (GOWN DISPOSABLE) ×3 IMPLANT
GOWN STRL REUS W/TWL LRG LVL3 (GOWN DISPOSABLE) ×4
GOWN STRL REUS W/TWL XL LVL3 (GOWN DISPOSABLE) ×2
GRAFT COLLAGEN VASCULAR 6X19 (Vascular Products) IMPLANT
HEMOSTAT SNOW SURGICEL 2X4 (HEMOSTASIS) IMPLANT
KIT BASIN OR (CUSTOM PROCEDURE TRAY) ×3 IMPLANT
KIT TURNOVER KIT B (KITS) ×3 IMPLANT
NDL 18GX1X1/2 (RX/OR ONLY) (NEEDLE) ×3 IMPLANT
NEEDLE 18GX1X1/2 (RX/OR ONLY) (NEEDLE) ×2 IMPLANT
NS IRRIG 1000ML POUR BTL (IV SOLUTION) ×3 IMPLANT
PACK CV ACCESS (CUSTOM PROCEDURE TRAY) ×3 IMPLANT
PAD ARMBOARD 7.5X6 YLW CONV (MISCELLANEOUS) ×6 IMPLANT
POWDER SURGICEL 3.0 GRAM (HEMOSTASIS) IMPLANT
SUT ETHILON 3 0 PS 1 (SUTURE) ×3 IMPLANT
SUT MNCRL AB 4-0 PS2 18 (SUTURE) ×3 IMPLANT
SUT PROLENE 6 0 BV (SUTURE) IMPLANT
SUT SILK 2 0 SH (SUTURE) IMPLANT
SUT VIC AB 3-0 SH 27 (SUTURE) ×4
SUT VIC AB 3-0 SH 27X BRD (SUTURE) ×6 IMPLANT
SYR 10ML LL (SYRINGE) ×3 IMPLANT
SYR 20ML LL LF (SYRINGE) ×6 IMPLANT
SYR 50ML LL SCALE MARK (SYRINGE) IMPLANT
SYR 5ML LL (SYRINGE) ×3 IMPLANT
SYR CONTROL 10ML LL (SYRINGE) ×3 IMPLANT
SYR TOOMEY 50ML (SYRINGE) IMPLANT
TOWEL GREEN STERILE (TOWEL DISPOSABLE) ×3 IMPLANT
TOWEL GREEN STERILE FF (TOWEL DISPOSABLE) ×6 IMPLANT
UNDERPAD 30X36 HEAVY ABSORB (UNDERPADS AND DIAPERS) ×3 IMPLANT
WATER STERILE IRR 1000ML POUR (IV SOLUTION) ×3 IMPLANT

## 2023-07-04 NOTE — Op Note (Signed)
Patient name: [name] MRN: [id number] DOB: Apr 24, 1961 Sex: male  07/04/2023 Pre-operative Diagnosis: End-stage renal disease, malfunction right arm AV fistula with ulceration Post-operative diagnosis:  Same Surgeon:  Luanna Salk. Randie Heinz, MD Assistant: Nathanial Rancher, PA Procedure Performed: 1.  Placement of right IJ 19 cm tunneled dialysis catheter with ultrasound fluoroscopic guidance 2.  Revision of right forearm AV fistula with interposition Artegraft  Indications: 62 year old male with history of end-stage renal disease currently dialyzing via ulcerated right arm AV fistula.  He has undergone multiple previous endovascular procedures but now has continued skin breakdown and is indicated for revision and will also need catheter to rest the right forearm.  An experienced assistant was necessary to facilitate exposure of the proximal distal fistula as well as tunneling on Artegraft and performing end-to-end anastomoses on both ends with 6-0 Prolene suture.  Findings: The right IJ was patent and compressible and a tunnel dialysis catheter was placed to the SVC atrial junction.  In the right forearm there was a severely ulcerated fistula and there was healthy fistula proximally and distally and a 19 cm Artegraft was tunneled between the 2 and a completion there was some pulsatility in the Artegraft but it was patent with strong Doppler runoff.  If the Artegraft fails we would consider converting to upper arm fistula versus graft and he would need to continue dialysis via tunneled dialysis catheter.   Procedure:  The patient was identified in the holding area and taken to the operating room where he was placed upon operative table and general anesthesia was induced.  He was sterilely prepped and draped in the right upper extremity bilateral neck and chest in usual fashion, antibiotics were administered a timeout was called.  Ultrasound was initially used to identify the right internal jugular  vein which was patent and compressible and this was cannulated with an 18-gauge needle and a J-wire was placed centrally.  A counterincision was created and a 19 cm catheter was tunneled to the neck incision.  The wire tract was serially dilated under fluoroscopic guidance and the introducer sheath was placed.  The catheter was then placed to the SVC atrial junction under fluoroscopic guidance.  This was flushed with citrated saline as the patient has previous hitt diagnosis.  This was then affixed to the skin with 3-0 nylon suture and the neck incision was closed with figure-of-eight 4-0 Monocryl and Dermabond was placed at both sites and a sterile dressing was applied.  Attention was then turned to the right upper extremity where ultrasound was used to identify patent proximal and distal fistula and transverse incisions were created.  We dissected down around both ends of the fistula and encircled these with vessel loop and marked them for orientation.  Artegraft measuring 19 cm was tunneled between the 2 incisions.  We turned our attention first to the outflow and clamped the outflow in both directions transected this spatulated the end and sewed the Artegraft end to end with 6-0 Prolene suture.  Prior to completion we flushed with saline and then reclamped the outflow.  The inflow was then clamped proximally distally and transected.  He had very strong inflow that was tested and flushed with heparinized saline.  We then irrigated the in between area of the fistula with heparinized saline and tied both ends off with 0 silk suture.  We then trimmed the Artegraft to size and sewed this into into the inflow with 6-0 Prolene suture.  Prior completion without flushing of directions.  Upon completion there was very strong inflow but there was some pulsatility and Doppler confirmed flow through the fistula both proximally and distally.  We irrigated the wounds we freed up some soft tissue around the Artegraft we then  closed in layers of Vicryl and Monocryl.  Patient was then awakened from anesthesia having tolerated the procedure without immediate complication.  All counts were correct at completion.  EBL: 50 cc   Britnie Colville C. Randie Heinz, MD [facility] Office: [phone] Pager: [phone]

## 2023-07-04 NOTE — Interval H&P Note (Signed)
History and Physical Interval Note:  07/04/2023 7:29 AM  [name]  has presented today for surgery, with the diagnosis of ESRD.  The various methods of treatment have been discussed with the patient and family. After consideration of risks, benefits and other options for treatment, the patient has consented to  Procedure(s): INSERTION OF DIALYSIS CATHETER (N/A) INSERTION  OF ARTERIOVENOUS (AV) GORE-TEX GRAFT RIGHT ARM (Right) as a surgical intervention.  The patient's history has been reviewed, patient examined, no change in status, stable for surgery.  I have reviewed the patient's chart and labs.  Questions were answered to the patient's satisfaction.     [name]

## 2023-07-04 NOTE — Discharge Instructions (Signed)
[facility]  Discharge Instructions  AV Fistula or Graft Surgery for Dialysis Access  Please refer to the following instructions for your post-procedure care. Your surgeon or physician assistant will discuss any changes with you.  Activity  You may drive the day following your surgery, if you are comfortable and no longer taking prescription pain medication. Resume full activity as the soreness in your incision resolves.  Bathing/Showering  You may shower after you go home. Keep your incision dry for 48 hours. Do not soak in a bathtub, hot tub, or swim until the incision heals completely. You may not shower if you have a hemodialysis catheter.  Incision Care  Clean your incision with mild soap and water after 48 hours. Pat the area dry with a clean towel. You do not need a bandage unless otherwise instructed. Do not apply any ointments or creams to your incision. You may have skin glue on your incision. Do not peel it off. It will come off on its own in about one week. Your arm may swell a bit after surgery. To reduce swelling use pillows to elevate your arm so it is above your heart. Your doctor will tell you if you need to lightly wrap your arm with an ACE bandage.  Diet  Resume your normal diet. There are not special food restrictions following this procedure. In order to heal from your surgery, it is CRITICAL to get adequate nutrition. Your body requires vitamins, minerals, and protein. Vegetables are the best source of vitamins and minerals. Vegetables also provide the perfect balance of protein. Processed food has little nutritional value, so try to avoid this.  Medications  Resume taking all of your medications. If your incision is causing pain, you may take over-the counter pain relievers such as acetaminophen (Tylenol). If you were prescribed a stronger pain medication, please be aware these medications can cause nausea and constipation. Prevent  nausea by taking the medication with a snack or meal. Avoid constipation by drinking plenty of fluids and eating foods with high amount of fiber, such as fruits, vegetables, and grains.  Do not take Tylenol if you are taking prescription pain medications.  Follow up Your surgeon may want to see you in the office following your access surgery. If so, this will be arranged at the time of your surgery.  Please call us immediately for any of the following conditions:  Increased pain, redness, drainage (pus) from your incision site Fever of 101 degrees or higher Severe or worsening pain at your incision site Hand pain or numbness.  Reduce your risk of vascular disease:  Stop smoking. If you would like help, call [facility] at 1-800-QUIT-NOW (734-613-8741) or [place] at 501-075-1738  Manage your cholesterol Maintain a desired weight Control your diabetes Keep your blood pressure down  Dialysis  It will take several weeks to several months for your new dialysis access to be ready for use. Your surgeon will determine when it is okay to use it. Your nephrologist will continue to direct your dialysis. You can continue to use your Permcath until your new access is ready for use.   07/04/2023 [name] [id number] [birth year]  Surgeon(s): Maeola Harman, MD  Procedure(s): INSERTION OF DIALYSIS CATHETER WITH 19 CM PALINDROME ARTERIOVENOUS (AV) FISTULA  REVISION WITH APPLICATION OF ARTEGRAFT RIGHT ARM   May stick graft immediately   May stick graft on designated area only:   X Do not stick Right AV graft for 3  weeks    If you have any questions, please call the office at [phone].

## 2023-07-04 NOTE — Transfer of Care (Signed)
Immediate Anesthesia Transfer of Care Note  Patient: [name]  Procedure(s) Performed: INSERTION OF DIALYSIS CATHETER WITH 19 CM PALINDROME (Right: Chest) ARTERIOVENOUS (AV) FISTULA  REVISION WITH APPLICATION OF ARTEGRAFT RIGHT ARM (Right: Arm Lower)  Patient Location: PACU  Anesthesia Type:General  Level of Consciousness: drowsy, patient cooperative, and responds to stimulation  Airway & Oxygen Therapy: Patient Spontanous Breathing and Patient connected to nasal cannula oxygen  Post-op Assessment: Report given to RN and Post -op Vital signs reviewed and stable  Post vital signs: Reviewed and stable  Last Vitals:  Vitals Value Taken Time  BP 122/64 07/04/23 1109  Temp    Pulse 68 07/04/23 1111  Resp 16 07/04/23 1111  SpO2 100 % 07/04/23 1111  Vitals shown include unfiled device data.  Last Pain:  Vitals:   07/04/23 0751  PainSc: 0-No pain         Complications: No notable events documented.

## 2023-07-04 NOTE — Anesthesia Postprocedure Evaluation (Signed)
Anesthesia Post Note  Patient: [name]  Procedure(s) Performed: INSERTION OF DIALYSIS CATHETER WITH 19 CM PALINDROME (Right: Chest) ARTERIOVENOUS (AV) FISTULA  REVISION WITH APPLICATION OF ARTEGRAFT RIGHT ARM (Right: Arm Lower)     Patient location during evaluation: PACU Anesthesia Type: General Level of consciousness: awake and alert Pain management: pain level controlled Vital Signs Assessment: post-procedure vital signs reviewed and stable Respiratory status: spontaneous breathing, nonlabored ventilation, respiratory function stable and patient connected to nasal cannula oxygen Cardiovascular status: blood pressure returned to baseline and stable Postop Assessment: no apparent nausea or vomiting Anesthetic complications: no   No notable events documented.  Last Vitals:  Vitals:   07/04/23 1145 07/04/23 1200  BP: 116/63 124/64  Pulse: 71 72  Resp: 14 15  Temp:  36.4 C  SpO2: 96% 99%    Last Pain:  Vitals:   07/04/23 1200  PainSc: 0-No pain                 Collene Schlichter

## 2023-07-04 NOTE — Anesthesia Procedure Notes (Signed)
Procedure Name: LMA Insertion Date/Time: 07/04/2023 9:22 AM  Performed by: Ayesha Rumpf, CRNAPre-anesthesia Checklist: Patient identified, Emergency Drugs available, Suction available and Patient being monitored Patient Re-evaluated:Patient Re-evaluated prior to induction Oxygen Delivery Method: Circle System Utilized Preoxygenation: Pre-oxygenation with 100% oxygen Induction Type: IV induction Ventilation: Mask ventilation without difficulty LMA: LMA inserted LMA Size: 5.0 Number of attempts: 1 Airway Equipment and Method: Bite block Placement Confirmation: positive ETCO2 Tube secured with: Tape Dental Injury: Teeth and Oropharynx as per pre-operative assessment  Comments: Insertion by Charlynn Grimes, MD

## 2023-07-04 NOTE — Anesthesia Procedure Notes (Signed)
Procedure Name: Intubation Date/Time: 07/04/2023 9:36 AM  Performed by: Ayesha Rumpf, CRNAPre-anesthesia Checklist: Patient identified, Emergency Drugs available, Suction available and Patient being monitored Patient Re-evaluated:Patient Re-evaluated prior to induction Oxygen Delivery Method: Circle System Utilized Preoxygenation: Pre-oxygenation with 100% oxygen Induction Type: IV induction Ventilation: Mask ventilation without difficulty Laryngoscope Size: Mac and 4 Grade View: Grade III Tube type: Oral Tube size: 7.5 mm Number of attempts: 1 Airway Equipment and Method: Stylet and Oral airway Placement Confirmation: ETT inserted through vocal cords under direct vision, positive ETCO2 and breath sounds checked- equal and bilateral Secured at: 24 cm Tube secured with: Tape Dental Injury: Teeth and Oropharynx as per pre-operative assessment

## 2023-07-05 ENCOUNTER — Encounter (HOSPITAL_COMMUNITY): Payer: Self-pay | Admitting: Vascular Surgery

## 2023-07-05 DIAGNOSIS — Z992 Dependence on renal dialysis: Secondary | ICD-10-CM | POA: Diagnosis not present

## 2023-07-05 DIAGNOSIS — E1129 Type 2 diabetes mellitus with other diabetic kidney complication: Secondary | ICD-10-CM | POA: Diagnosis not present

## 2023-07-05 DIAGNOSIS — N2581 Secondary hyperparathyroidism of renal origin: Secondary | ICD-10-CM | POA: Diagnosis not present

## 2023-07-05 DIAGNOSIS — D509 Iron deficiency anemia, unspecified: Secondary | ICD-10-CM | POA: Diagnosis not present

## 2023-07-05 DIAGNOSIS — N186 End stage renal disease: Secondary | ICD-10-CM | POA: Diagnosis not present

## 2023-07-05 DIAGNOSIS — D631 Anemia in chronic kidney disease: Secondary | ICD-10-CM | POA: Diagnosis not present

## 2023-07-08 DIAGNOSIS — D509 Iron deficiency anemia, unspecified: Secondary | ICD-10-CM | POA: Diagnosis not present

## 2023-07-08 DIAGNOSIS — N2581 Secondary hyperparathyroidism of renal origin: Secondary | ICD-10-CM | POA: Diagnosis not present

## 2023-07-08 DIAGNOSIS — Z992 Dependence on renal dialysis: Secondary | ICD-10-CM | POA: Diagnosis not present

## 2023-07-08 DIAGNOSIS — N186 End stage renal disease: Secondary | ICD-10-CM | POA: Diagnosis not present

## 2023-07-08 DIAGNOSIS — D631 Anemia in chronic kidney disease: Secondary | ICD-10-CM | POA: Diagnosis not present

## 2023-07-08 DIAGNOSIS — E1129 Type 2 diabetes mellitus with other diabetic kidney complication: Secondary | ICD-10-CM | POA: Diagnosis not present

## 2023-07-10 DIAGNOSIS — N2581 Secondary hyperparathyroidism of renal origin: Secondary | ICD-10-CM | POA: Diagnosis not present

## 2023-07-10 DIAGNOSIS — Z992 Dependence on renal dialysis: Secondary | ICD-10-CM | POA: Diagnosis not present

## 2023-07-10 DIAGNOSIS — N186 End stage renal disease: Secondary | ICD-10-CM | POA: Diagnosis not present

## 2023-07-10 DIAGNOSIS — D631 Anemia in chronic kidney disease: Secondary | ICD-10-CM | POA: Diagnosis not present

## 2023-07-10 DIAGNOSIS — D509 Iron deficiency anemia, unspecified: Secondary | ICD-10-CM | POA: Diagnosis not present

## 2023-07-10 DIAGNOSIS — E1129 Type 2 diabetes mellitus with other diabetic kidney complication: Secondary | ICD-10-CM | POA: Diagnosis not present

## 2023-07-12 DIAGNOSIS — E1129 Type 2 diabetes mellitus with other diabetic kidney complication: Secondary | ICD-10-CM | POA: Diagnosis not present

## 2023-07-12 DIAGNOSIS — D631 Anemia in chronic kidney disease: Secondary | ICD-10-CM | POA: Diagnosis not present

## 2023-07-12 DIAGNOSIS — D509 Iron deficiency anemia, unspecified: Secondary | ICD-10-CM | POA: Diagnosis not present

## 2023-07-12 DIAGNOSIS — Z992 Dependence on renal dialysis: Secondary | ICD-10-CM | POA: Diagnosis not present

## 2023-07-12 DIAGNOSIS — N186 End stage renal disease: Secondary | ICD-10-CM | POA: Diagnosis not present

## 2023-07-12 DIAGNOSIS — N2581 Secondary hyperparathyroidism of renal origin: Secondary | ICD-10-CM | POA: Diagnosis not present

## 2023-07-15 DIAGNOSIS — N2581 Secondary hyperparathyroidism of renal origin: Secondary | ICD-10-CM | POA: Diagnosis not present

## 2023-07-15 DIAGNOSIS — D631 Anemia in chronic kidney disease: Secondary | ICD-10-CM | POA: Diagnosis not present

## 2023-07-15 DIAGNOSIS — E1129 Type 2 diabetes mellitus with other diabetic kidney complication: Secondary | ICD-10-CM | POA: Diagnosis not present

## 2023-07-15 DIAGNOSIS — Z992 Dependence on renal dialysis: Secondary | ICD-10-CM | POA: Diagnosis not present

## 2023-07-15 DIAGNOSIS — N186 End stage renal disease: Secondary | ICD-10-CM | POA: Diagnosis not present

## 2023-07-15 DIAGNOSIS — D509 Iron deficiency anemia, unspecified: Secondary | ICD-10-CM | POA: Diagnosis not present

## 2023-07-17 DIAGNOSIS — E1129 Type 2 diabetes mellitus with other diabetic kidney complication: Secondary | ICD-10-CM | POA: Diagnosis not present

## 2023-07-17 DIAGNOSIS — Z992 Dependence on renal dialysis: Secondary | ICD-10-CM | POA: Diagnosis not present

## 2023-07-17 DIAGNOSIS — D631 Anemia in chronic kidney disease: Secondary | ICD-10-CM | POA: Diagnosis not present

## 2023-07-17 DIAGNOSIS — D509 Iron deficiency anemia, unspecified: Secondary | ICD-10-CM | POA: Diagnosis not present

## 2023-07-17 DIAGNOSIS — N2581 Secondary hyperparathyroidism of renal origin: Secondary | ICD-10-CM | POA: Diagnosis not present

## 2023-07-17 DIAGNOSIS — N186 End stage renal disease: Secondary | ICD-10-CM | POA: Diagnosis not present

## 2023-07-19 DIAGNOSIS — Z992 Dependence on renal dialysis: Secondary | ICD-10-CM | POA: Diagnosis not present

## 2023-07-19 DIAGNOSIS — D509 Iron deficiency anemia, unspecified: Secondary | ICD-10-CM | POA: Diagnosis not present

## 2023-07-19 DIAGNOSIS — N186 End stage renal disease: Secondary | ICD-10-CM | POA: Diagnosis not present

## 2023-07-19 DIAGNOSIS — E1129 Type 2 diabetes mellitus with other diabetic kidney complication: Secondary | ICD-10-CM | POA: Diagnosis not present

## 2023-07-19 DIAGNOSIS — D631 Anemia in chronic kidney disease: Secondary | ICD-10-CM | POA: Diagnosis not present

## 2023-07-19 DIAGNOSIS — N2581 Secondary hyperparathyroidism of renal origin: Secondary | ICD-10-CM | POA: Diagnosis not present

## 2023-07-20 DIAGNOSIS — I959 Hypotension, unspecified: Secondary | ICD-10-CM | POA: Diagnosis not present

## 2023-07-20 DIAGNOSIS — R5383 Other fatigue: Secondary | ICD-10-CM | POA: Diagnosis not present

## 2023-07-20 DIAGNOSIS — N186 End stage renal disease: Secondary | ICD-10-CM | POA: Diagnosis not present

## 2023-07-20 DIAGNOSIS — Z79899 Other long term (current) drug therapy: Secondary | ICD-10-CM | POA: Diagnosis not present

## 2023-07-20 DIAGNOSIS — R4189 Other symptoms and signs involving cognitive functions and awareness: Secondary | ICD-10-CM | POA: Diagnosis not present

## 2023-07-20 DIAGNOSIS — F0280 Dementia in other diseases classified elsewhere without behavioral disturbance: Secondary | ICD-10-CM | POA: Diagnosis not present

## 2023-07-20 DIAGNOSIS — Z789 Other specified health status: Secondary | ICD-10-CM | POA: Diagnosis not present

## 2023-07-20 DIAGNOSIS — R531 Weakness: Secondary | ICD-10-CM | POA: Diagnosis not present

## 2023-07-20 DIAGNOSIS — Z992 Dependence on renal dialysis: Secondary | ICD-10-CM | POA: Diagnosis not present

## 2023-07-20 DIAGNOSIS — G3109 Other frontotemporal dementia: Secondary | ICD-10-CM | POA: Diagnosis not present

## 2023-07-20 DIAGNOSIS — I779 Disorder of arteries and arterioles, unspecified: Secondary | ICD-10-CM | POA: Diagnosis not present

## 2023-07-21 DIAGNOSIS — N186 End stage renal disease: Secondary | ICD-10-CM | POA: Diagnosis not present

## 2023-07-21 DIAGNOSIS — Z992 Dependence on renal dialysis: Secondary | ICD-10-CM | POA: Diagnosis not present

## 2023-07-21 DIAGNOSIS — N2581 Secondary hyperparathyroidism of renal origin: Secondary | ICD-10-CM | POA: Diagnosis not present

## 2023-07-21 DIAGNOSIS — D631 Anemia in chronic kidney disease: Secondary | ICD-10-CM | POA: Diagnosis not present

## 2023-07-21 DIAGNOSIS — E1129 Type 2 diabetes mellitus with other diabetic kidney complication: Secondary | ICD-10-CM | POA: Diagnosis not present

## 2023-07-21 DIAGNOSIS — D509 Iron deficiency anemia, unspecified: Secondary | ICD-10-CM | POA: Diagnosis not present

## 2023-07-22 DIAGNOSIS — F02C0 Dementia in other diseases classified elsewhere, severe, without behavioral disturbance, psychotic disturbance, mood disturbance, and anxiety: Secondary | ICD-10-CM | POA: Diagnosis not present

## 2023-07-23 DIAGNOSIS — Z992 Dependence on renal dialysis: Secondary | ICD-10-CM | POA: Diagnosis not present

## 2023-07-23 DIAGNOSIS — D509 Iron deficiency anemia, unspecified: Secondary | ICD-10-CM | POA: Diagnosis not present

## 2023-07-23 DIAGNOSIS — E1129 Type 2 diabetes mellitus with other diabetic kidney complication: Secondary | ICD-10-CM | POA: Diagnosis not present

## 2023-07-23 DIAGNOSIS — N186 End stage renal disease: Secondary | ICD-10-CM | POA: Diagnosis not present

## 2023-07-23 DIAGNOSIS — D631 Anemia in chronic kidney disease: Secondary | ICD-10-CM | POA: Diagnosis not present

## 2023-07-23 DIAGNOSIS — N2581 Secondary hyperparathyroidism of renal origin: Secondary | ICD-10-CM | POA: Diagnosis not present

## 2023-07-26 DIAGNOSIS — N186 End stage renal disease: Secondary | ICD-10-CM | POA: Diagnosis not present

## 2023-07-26 DIAGNOSIS — Z992 Dependence on renal dialysis: Secondary | ICD-10-CM | POA: Diagnosis not present

## 2023-07-28 DIAGNOSIS — Z992 Dependence on renal dialysis: Secondary | ICD-10-CM | POA: Diagnosis not present

## 2023-07-28 DIAGNOSIS — N186 End stage renal disease: Secondary | ICD-10-CM | POA: Diagnosis not present

## 2023-07-28 DIAGNOSIS — F02C0 Dementia in other diseases classified elsewhere, severe, without behavioral disturbance, psychotic disturbance, mood disturbance, and anxiety: Secondary | ICD-10-CM | POA: Diagnosis not present

## 2023-07-28 DIAGNOSIS — T8620 Unspecified complication of heart transplant: Secondary | ICD-10-CM | POA: Diagnosis not present

## 2023-07-29 DIAGNOSIS — D631 Anemia in chronic kidney disease: Secondary | ICD-10-CM | POA: Diagnosis not present

## 2023-07-29 DIAGNOSIS — Z992 Dependence on renal dialysis: Secondary | ICD-10-CM | POA: Diagnosis not present

## 2023-07-29 DIAGNOSIS — N186 End stage renal disease: Secondary | ICD-10-CM | POA: Diagnosis not present

## 2023-07-29 DIAGNOSIS — D509 Iron deficiency anemia, unspecified: Secondary | ICD-10-CM | POA: Diagnosis not present

## 2023-07-29 DIAGNOSIS — N2581 Secondary hyperparathyroidism of renal origin: Secondary | ICD-10-CM | POA: Diagnosis not present

## 2023-07-29 DIAGNOSIS — E1129 Type 2 diabetes mellitus with other diabetic kidney complication: Secondary | ICD-10-CM | POA: Diagnosis not present

## 2023-07-31 DIAGNOSIS — E1129 Type 2 diabetes mellitus with other diabetic kidney complication: Secondary | ICD-10-CM | POA: Diagnosis not present

## 2023-07-31 DIAGNOSIS — D631 Anemia in chronic kidney disease: Secondary | ICD-10-CM | POA: Diagnosis not present

## 2023-07-31 DIAGNOSIS — D509 Iron deficiency anemia, unspecified: Secondary | ICD-10-CM | POA: Diagnosis not present

## 2023-07-31 DIAGNOSIS — N186 End stage renal disease: Secondary | ICD-10-CM | POA: Diagnosis not present

## 2023-07-31 DIAGNOSIS — Z992 Dependence on renal dialysis: Secondary | ICD-10-CM | POA: Diagnosis not present

## 2023-07-31 DIAGNOSIS — N2581 Secondary hyperparathyroidism of renal origin: Secondary | ICD-10-CM | POA: Diagnosis not present

## 2023-08-01 DIAGNOSIS — I10 Essential (primary) hypertension: Secondary | ICD-10-CM | POA: Diagnosis not present

## 2023-08-01 DIAGNOSIS — I6350 Cerebral infarction due to unspecified occlusion or stenosis of unspecified cerebral artery: Secondary | ICD-10-CM | POA: Diagnosis not present

## 2023-08-01 DIAGNOSIS — Z7189 Other specified counseling: Secondary | ICD-10-CM | POA: Diagnosis not present

## 2023-08-01 DIAGNOSIS — Z0000 Encounter for general adult medical examination without abnormal findings: Secondary | ICD-10-CM | POA: Diagnosis not present

## 2023-08-01 DIAGNOSIS — D6869 Other thrombophilia: Secondary | ICD-10-CM | POA: Diagnosis not present

## 2023-08-01 DIAGNOSIS — G894 Chronic pain syndrome: Secondary | ICD-10-CM | POA: Diagnosis not present

## 2023-08-01 DIAGNOSIS — I420 Dilated cardiomyopathy: Secondary | ICD-10-CM | POA: Diagnosis not present

## 2023-08-02 DIAGNOSIS — E1122 Type 2 diabetes mellitus with diabetic chronic kidney disease: Secondary | ICD-10-CM | POA: Diagnosis not present

## 2023-08-02 DIAGNOSIS — I4891 Unspecified atrial fibrillation: Secondary | ICD-10-CM | POA: Diagnosis not present

## 2023-08-02 DIAGNOSIS — I951 Orthostatic hypotension: Secondary | ICD-10-CM | POA: Diagnosis not present

## 2023-08-02 DIAGNOSIS — F02818 Dementia in other diseases classified elsewhere, unspecified severity, with other behavioral disturbance: Secondary | ICD-10-CM | POA: Diagnosis not present

## 2023-08-02 DIAGNOSIS — Z7901 Long term (current) use of anticoagulants: Secondary | ICD-10-CM | POA: Diagnosis not present

## 2023-08-02 DIAGNOSIS — D631 Anemia in chronic kidney disease: Secondary | ICD-10-CM | POA: Diagnosis not present

## 2023-08-02 DIAGNOSIS — F0283 Dementia in other diseases classified elsewhere, unspecified severity, with mood disturbance: Secondary | ICD-10-CM | POA: Diagnosis not present

## 2023-08-02 DIAGNOSIS — F32A Depression, unspecified: Secondary | ICD-10-CM | POA: Diagnosis not present

## 2023-08-02 DIAGNOSIS — I509 Heart failure, unspecified: Secondary | ICD-10-CM | POA: Diagnosis not present

## 2023-08-02 DIAGNOSIS — N2581 Secondary hyperparathyroidism of renal origin: Secondary | ICD-10-CM | POA: Diagnosis not present

## 2023-08-02 DIAGNOSIS — I447 Left bundle-branch block, unspecified: Secondary | ICD-10-CM | POA: Diagnosis not present

## 2023-08-02 DIAGNOSIS — I252 Old myocardial infarction: Secondary | ICD-10-CM | POA: Diagnosis not present

## 2023-08-02 DIAGNOSIS — E1142 Type 2 diabetes mellitus with diabetic polyneuropathy: Secondary | ICD-10-CM | POA: Diagnosis not present

## 2023-08-02 DIAGNOSIS — I2510 Atherosclerotic heart disease of native coronary artery without angina pectoris: Secondary | ICD-10-CM | POA: Diagnosis not present

## 2023-08-02 DIAGNOSIS — Z6836 Body mass index (BMI) 36.0-36.9, adult: Secondary | ICD-10-CM | POA: Diagnosis not present

## 2023-08-02 DIAGNOSIS — Z992 Dependence on renal dialysis: Secondary | ICD-10-CM | POA: Diagnosis not present

## 2023-08-02 DIAGNOSIS — N186 End stage renal disease: Secondary | ICD-10-CM | POA: Diagnosis not present

## 2023-08-02 DIAGNOSIS — D509 Iron deficiency anemia, unspecified: Secondary | ICD-10-CM | POA: Diagnosis not present

## 2023-08-02 DIAGNOSIS — I420 Dilated cardiomyopathy: Secondary | ICD-10-CM | POA: Diagnosis not present

## 2023-08-02 DIAGNOSIS — E1129 Type 2 diabetes mellitus with other diabetic kidney complication: Secondary | ICD-10-CM | POA: Diagnosis not present

## 2023-08-02 DIAGNOSIS — M1990 Unspecified osteoarthritis, unspecified site: Secondary | ICD-10-CM | POA: Diagnosis not present

## 2023-08-02 DIAGNOSIS — I132 Hypertensive heart and chronic kidney disease with heart failure and with stage 5 chronic kidney disease, or end stage renal disease: Secondary | ICD-10-CM | POA: Diagnosis not present

## 2023-08-02 DIAGNOSIS — E7800 Pure hypercholesterolemia, unspecified: Secondary | ICD-10-CM | POA: Diagnosis not present

## 2023-08-02 DIAGNOSIS — G20C Parkinsonism, unspecified: Secondary | ICD-10-CM | POA: Diagnosis not present

## 2023-08-02 DIAGNOSIS — I493 Ventricular premature depolarization: Secondary | ICD-10-CM | POA: Diagnosis not present

## 2023-08-02 DIAGNOSIS — G4733 Obstructive sleep apnea (adult) (pediatric): Secondary | ICD-10-CM | POA: Diagnosis not present

## 2023-08-02 DIAGNOSIS — G894 Chronic pain syndrome: Secondary | ICD-10-CM | POA: Diagnosis not present

## 2023-08-05 DIAGNOSIS — E1129 Type 2 diabetes mellitus with other diabetic kidney complication: Secondary | ICD-10-CM | POA: Diagnosis not present

## 2023-08-05 DIAGNOSIS — D631 Anemia in chronic kidney disease: Secondary | ICD-10-CM | POA: Diagnosis not present

## 2023-08-05 DIAGNOSIS — Z992 Dependence on renal dialysis: Secondary | ICD-10-CM | POA: Diagnosis not present

## 2023-08-05 DIAGNOSIS — D509 Iron deficiency anemia, unspecified: Secondary | ICD-10-CM | POA: Diagnosis not present

## 2023-08-05 DIAGNOSIS — N2581 Secondary hyperparathyroidism of renal origin: Secondary | ICD-10-CM | POA: Diagnosis not present

## 2023-08-05 DIAGNOSIS — N186 End stage renal disease: Secondary | ICD-10-CM | POA: Diagnosis not present

## 2023-08-06 DIAGNOSIS — N186 End stage renal disease: Secondary | ICD-10-CM | POA: Diagnosis not present

## 2023-08-06 DIAGNOSIS — E1122 Type 2 diabetes mellitus with diabetic chronic kidney disease: Secondary | ICD-10-CM | POA: Diagnosis not present

## 2023-08-06 DIAGNOSIS — D631 Anemia in chronic kidney disease: Secondary | ICD-10-CM | POA: Diagnosis not present

## 2023-08-06 DIAGNOSIS — I420 Dilated cardiomyopathy: Secondary | ICD-10-CM | POA: Diagnosis not present

## 2023-08-06 DIAGNOSIS — I509 Heart failure, unspecified: Secondary | ICD-10-CM | POA: Diagnosis not present

## 2023-08-06 DIAGNOSIS — I132 Hypertensive heart and chronic kidney disease with heart failure and with stage 5 chronic kidney disease, or end stage renal disease: Secondary | ICD-10-CM | POA: Diagnosis not present

## 2023-08-07 DIAGNOSIS — N186 End stage renal disease: Secondary | ICD-10-CM | POA: Diagnosis not present

## 2023-08-07 DIAGNOSIS — D509 Iron deficiency anemia, unspecified: Secondary | ICD-10-CM | POA: Diagnosis not present

## 2023-08-07 DIAGNOSIS — I509 Heart failure, unspecified: Secondary | ICD-10-CM | POA: Diagnosis not present

## 2023-08-07 DIAGNOSIS — E1129 Type 2 diabetes mellitus with other diabetic kidney complication: Secondary | ICD-10-CM | POA: Diagnosis not present

## 2023-08-07 DIAGNOSIS — I420 Dilated cardiomyopathy: Secondary | ICD-10-CM | POA: Diagnosis not present

## 2023-08-07 DIAGNOSIS — Z992 Dependence on renal dialysis: Secondary | ICD-10-CM | POA: Diagnosis not present

## 2023-08-07 DIAGNOSIS — N2581 Secondary hyperparathyroidism of renal origin: Secondary | ICD-10-CM | POA: Diagnosis not present

## 2023-08-07 DIAGNOSIS — I132 Hypertensive heart and chronic kidney disease with heart failure and with stage 5 chronic kidney disease, or end stage renal disease: Secondary | ICD-10-CM | POA: Diagnosis not present

## 2023-08-07 DIAGNOSIS — D631 Anemia in chronic kidney disease: Secondary | ICD-10-CM | POA: Diagnosis not present

## 2023-08-07 DIAGNOSIS — E1122 Type 2 diabetes mellitus with diabetic chronic kidney disease: Secondary | ICD-10-CM | POA: Diagnosis not present

## 2023-08-08 DIAGNOSIS — I10 Essential (primary) hypertension: Secondary | ICD-10-CM | POA: Diagnosis not present

## 2023-08-08 DIAGNOSIS — Z0000 Encounter for general adult medical examination without abnormal findings: Secondary | ICD-10-CM | POA: Diagnosis not present

## 2023-08-09 DIAGNOSIS — N186 End stage renal disease: Secondary | ICD-10-CM | POA: Diagnosis not present

## 2023-08-09 DIAGNOSIS — D631 Anemia in chronic kidney disease: Secondary | ICD-10-CM | POA: Diagnosis not present

## 2023-08-09 DIAGNOSIS — N2581 Secondary hyperparathyroidism of renal origin: Secondary | ICD-10-CM | POA: Diagnosis not present

## 2023-08-09 DIAGNOSIS — D509 Iron deficiency anemia, unspecified: Secondary | ICD-10-CM | POA: Diagnosis not present

## 2023-08-09 DIAGNOSIS — Z992 Dependence on renal dialysis: Secondary | ICD-10-CM | POA: Diagnosis not present

## 2023-08-09 DIAGNOSIS — E1129 Type 2 diabetes mellitus with other diabetic kidney complication: Secondary | ICD-10-CM | POA: Diagnosis not present

## 2023-08-11 ENCOUNTER — Emergency Department (HOSPITAL_COMMUNITY): Payer: Medicare Other

## 2023-08-11 ENCOUNTER — Emergency Department (HOSPITAL_COMMUNITY)
Admission: EM | Admit: 2023-08-11 | Discharge: 2023-08-11 | Disposition: A | Discharge status: Home / Self Care | Payer: Medicare Other | Attending: Emergency Medicine | Admitting: Emergency Medicine

## 2023-08-11 ENCOUNTER — Other Ambulatory Visit: Payer: Self-pay

## 2023-08-11 ENCOUNTER — Encounter (HOSPITAL_COMMUNITY): Payer: Self-pay

## 2023-08-11 DIAGNOSIS — Z23 Encounter for immunization: Secondary | ICD-10-CM | POA: Insufficient documentation

## 2023-08-11 DIAGNOSIS — Z7901 Long term (current) use of anticoagulants: Secondary | ICD-10-CM | POA: Insufficient documentation

## 2023-08-11 DIAGNOSIS — F0390 Unspecified dementia without behavioral disturbance: Secondary | ICD-10-CM | POA: Diagnosis not present

## 2023-08-11 DIAGNOSIS — X58XXXA Exposure to other specified factors, initial encounter: Secondary | ICD-10-CM | POA: Diagnosis not present

## 2023-08-11 DIAGNOSIS — M85871 Other specified disorders of bone density and structure, right ankle and foot: Secondary | ICD-10-CM | POA: Diagnosis not present

## 2023-08-11 DIAGNOSIS — Z992 Dependence on renal dialysis: Secondary | ICD-10-CM | POA: Insufficient documentation

## 2023-08-11 DIAGNOSIS — I132 Hypertensive heart and chronic kidney disease with heart failure and with stage 5 chronic kidney disease, or end stage renal disease: Secondary | ICD-10-CM | POA: Diagnosis not present

## 2023-08-11 DIAGNOSIS — Z8546 Personal history of malignant neoplasm of prostate: Secondary | ICD-10-CM | POA: Insufficient documentation

## 2023-08-11 DIAGNOSIS — S91201A Unspecified open wound of right great toe with damage to nail, initial encounter: Secondary | ICD-10-CM | POA: Diagnosis not present

## 2023-08-11 DIAGNOSIS — E1122 Type 2 diabetes mellitus with diabetic chronic kidney disease: Secondary | ICD-10-CM | POA: Insufficient documentation

## 2023-08-11 DIAGNOSIS — I5022 Chronic systolic (congestive) heart failure: Secondary | ICD-10-CM | POA: Diagnosis not present

## 2023-08-11 DIAGNOSIS — S91101A Unspecified open wound of right great toe without damage to nail, initial encounter: Secondary | ICD-10-CM | POA: Diagnosis not present

## 2023-08-11 DIAGNOSIS — N186 End stage renal disease: Secondary | ICD-10-CM | POA: Insufficient documentation

## 2023-08-11 DIAGNOSIS — R600 Localized edema: Secondary | ICD-10-CM | POA: Diagnosis not present

## 2023-08-11 DIAGNOSIS — S99921A Unspecified injury of right foot, initial encounter: Secondary | ICD-10-CM | POA: Diagnosis present

## 2023-08-11 DIAGNOSIS — S92341A Displaced fracture of fourth metatarsal bone, right foot, initial encounter for closed fracture: Secondary | ICD-10-CM | POA: Diagnosis not present

## 2023-08-11 LAB — CBC WITH DIFFERENTIAL/PLATELET
Abs Immature Granulocytes: 0.03 10*3/uL (ref 0.00–0.07)
Basophils Absolute: 0.1 10*3/uL (ref 0.0–0.1)
Basophils Relative: 1 %
Eosinophils Absolute: 0.1 10*3/uL (ref 0.0–0.5)
Eosinophils Relative: 2 %
HCT: 34.2 % — ABNORMAL LOW (ref 39.0–52.0)
Hemoglobin: 10.8 g/dL — ABNORMAL LOW (ref 13.0–17.0)
Immature Granulocytes: 1 %
Lymphocytes Relative: 16 %
Lymphs Abs: 0.9 10*3/uL (ref 0.7–4.0)
MCH: 30.1 pg (ref 26.0–34.0)
MCHC: 31.6 g/dL (ref 30.0–36.0)
MCV: 95.3 fL (ref 80.0–100.0)
Monocytes Absolute: 0.5 10*3/uL (ref 0.1–1.0)
Monocytes Relative: 9 %
Neutro Abs: 3.9 10*3/uL (ref 1.7–7.7)
Neutrophils Relative %: 71 %
Platelets: 203 10*3/uL (ref 150–400)
RBC: 3.59 MIL/uL — ABNORMAL LOW (ref 4.22–5.81)
RDW: 16.2 % — ABNORMAL HIGH (ref 11.5–15.5)
WBC: 5.4 10*3/uL (ref 4.0–10.5)
nRBC: 0 % (ref 0.0–0.2)

## 2023-08-11 LAB — BASIC METABOLIC PANEL
Anion gap: 14 (ref 5–15)
BUN: 29 mg/dL — ABNORMAL HIGH (ref 8–23)
CO2: 27 mmol/L (ref 22–32)
Calcium: 9.5 mg/dL (ref 8.9–10.3)
Chloride: 95 mmol/L — ABNORMAL LOW (ref 98–111)
Creatinine, Ser: 8.57 mg/dL — ABNORMAL HIGH (ref 0.61–1.24)
GFR, Estimated: 6 mL/min — ABNORMAL LOW (ref >60)
Glucose, Bld: 114 mg/dL — ABNORMAL HIGH (ref 70–99)
Potassium: 3.9 mmol/L (ref 3.5–5.1)
Sodium: 136 mmol/L (ref 135–145)

## 2023-08-11 MED ORDER — TETANUS-DIPHTH-ACELL PERTUSSIS 5-2.5-18.5 LF-MCG/0.5 IM SUSY
0.5000 mL | PREFILLED_SYRINGE | Freq: Once | INTRAMUSCULAR | Status: AC
  Administered 2023-08-11: 0.5 mL via INTRAMUSCULAR
  Filled 2023-08-11: qty 0.5

## 2023-08-11 NOTE — ED Notes (Signed)
Applied bacitracin, nonstick wound gauze to right hallux, gave instructions to family, and left over supplies. Pt and family understood directions.

## 2023-08-11 NOTE — Discharge Instructions (Signed)
As we discussed, your workup in the ER today was reassuring for acute findings.  Laboratory evaluation and x-ray imaging did not reveal any emergent cause of your symptoms.  We have cleaned and dressed your wound here.  You will need to have this dressing change daily and cleaned thoroughly with soap and water.  Do not place anything such as alcohol or hydrogen peroxide on this area as it delays wound healing.  Please use materials provided for you today.  It needs to be kept moisturized with a barrier such as antibiotic ointment or Vaseline with a nonadherent dressing.  I have given you information about a wound care center where you can follow for long-term management of this wound as you may have trouble getting it to heal with your other medical conditions.  Call your primary doctor to schedule follow-up appointment as well.  Return if development of any new or worsening symptoms.

## 2023-08-11 NOTE — ED Provider Notes (Signed)
[place] EMERGENCY DEPARTMENT AT [facility] Provider Note   CSN: [id number] Arrival date & time: 08/11/23  1809     History  No chief complaint on file.   [name] is a 62 y.o. male.  Patient with history of afib, CHF, CKD, CVA, dementia, diabetes, GERD, heart transplant, hyperlipidemia, hypertension, morbid obesity, MI, ESRD on hemodialysis Monday Wednesday Friday presents today with complaints of right great toe wound.  Patient's family member at bedside states that she was giving him a bath and noticed that he had a skin tear on the bottom of his toe that was bleeding.  States that she sent a picture of it to his primary doctor who recommended he come to the ER for evaluation.  Patient states that he has such neuropathy that he has no sensation in this area and therefore no pain and did not know the wound was present.  He is unsure if he bumped his toe on anything.  Denies any fevers or chills.  He is unsure of his last tetanus.  His legs are swollen but this is unchanged from baseline.  No chest pain or shortness of breath.  He was last dialyzed on Friday.   The history is provided by the patient. No language interpreter was used.       Home Medications Prior to Admission medications   Medication Sig Start Date End Date Taking? Authorizing Provider  acetaminophen (TYLENOL) 500 MG tablet Take 1,000 mg by mouth in the morning and at bedtime.    [provider]  ARIPiprazole (ABILIFY) 10 MG tablet Take 10 mg by mouth every morning.    [provider]  cycloSPORINE modified (NEORAL) 100 MG capsule Take 100 mg by mouth See admin instructions. Take with 25 mg for a total of 125 mg twice daily    [provider]  cycloSPORINE modified 25 MG CAPS Take 25 mg by mouth See admin instructions. Take with 100 mg for a total of 125 mg twice daily 05/09/23   [provider]  divalproex (DEPAKOTE) 125 MG DR tablet Take 250 mg by mouth in morning  and 500 mg by mouth at night Patient taking differently: Take 250-500 mg by mouth See admin instructions. Take 250 mg by mouth in morning and 500 mg by mouth at night 12/17/22   Levert Feinstein, MD  DULoxetine (CYMBALTA) 60 MG capsule Take 1 capsule (60 mg total) by mouth daily. 11/01/22   Levert Feinstein, MD  ELIQUIS 2.5 MG TABS tablet Take 1 tablet (2.5 mg total) by mouth 2 (two) times daily. 04/30/21   Jerilee Field, MD  hydrocortisone (ANUSOL-HC) 2.5 % rectal cream Apply 1 Application topically as needed for hemorrhoids or anal itching. 11/01/22   [provider]  hydrOXYzine (ATARAX) 25 MG tablet Take 25 mg by mouth 2 (two) times daily.    [provider]  ibuprofen (ADVIL) 200 MG tablet Take 400 mg by mouth as needed for moderate pain (pain score 4-6) or mild pain (pain score 1-3).    [provider]  midodrine (PROAMATINE) 10 MG tablet Take 1 tablet (10 mg total) by mouth 3 (three) times daily. Patient taking differently: Take 10 mg by mouth every Monday, Wednesday, and Friday with hemodialysis. Take 10 mg twice a day on non-dialysis days 04/27/21   Jerilee Field, MD  mycophenolate (MYFORTIC) 180 MG EC tablet Take 720 mg by mouth 2 (two) times daily.    [provider]  naloxone [facility]) nasal  spray 4 mg/0.1 mL Place 0.4 mg into the nose once.    [provider]  ondansetron (ZOFRAN) 4 MG tablet Take 1 tablet (4 mg total) by mouth every 6 (six) hours as needed for nausea. Patient taking differently: Take 4 mg by mouth as needed for nausea. 07/23/17   Swinteck, Arlys John, MD  oxyCODONE-acetaminophen (PERCOCET) 5-325 MG tablet Take 1 tablet by mouth every 6 (six) hours as needed for severe pain (pain score 7-10). Do not take with other opiate pain medication 07/04/23 07/03/24  Baglia, Corrina, PA-C  pregabalin (LYRICA) 50 MG capsule Take 50 mg by mouth 2 (two) times daily.    [provider]  rosuvastatin (CRESTOR) 10 MG tablet Take 1 tablet (10 mg total) by  mouth daily. 07/06/22   Rhetta Mura, MD  sevelamer carbonate (RENVELA) 800 MG tablet Take 2 tablets (1,600 mg total) by mouth 3 (three) times daily with meals. Patient taking differently: Take 2,400 mg by mouth 3 (three) times daily with meals. 800 mg with snacks 11/11/18   Darlin Drop, DO  sucroferric oxyhydroxide (VELPHORO) 500 MG chewable tablet Chew 500 mg by mouth in the morning and at bedtime.    [provider]  traZODone (DESYREL) 100 MG tablet Take 100 mg by mouth at bedtime. 08/18/22   [provider]      Allergies    Heparin, Iodinated contrast media, and Penicillin g potassium [penicillin g]    Review of Systems   Review of Systems  Skin:  Positive for wound.  All other systems reviewed and are negative.   Physical Exam Updated Vital Signs BP 100/73   Pulse 80   Temp (!) 97.3 F (36.3 C)   Resp 18   Ht 6\' 6"  (1.981 m)   Wt (!) 142.9 kg   SpO2 100%   BMI 36.41 kg/m  Physical Exam Vitals and nursing note reviewed.  Constitutional:      General: He is not in acute distress.    Appearance: Normal appearance. He is normal weight. He is not ill-appearing, toxic-appearing or diaphoretic.  HENT:     Head: Normocephalic and atraumatic.  Cardiovascular:     Rate and Rhythm: Normal rate.  Pulmonary:     Effort: Pulmonary effort is normal. No respiratory distress.  Musculoskeletal:        General: Normal range of motion.     Cervical back: Normal range of motion.  Skin:    General: Skin is warm and dry.     Comments: Bilateral pitting edema noted to lower extremities without cellulitic changes  Skin avulsion injury located on the bottom of the right great toe. No active bleeding or deformity. Capillary refill less than 2 seconds. No nailbed involvement. No signs of foreign body. See images below for further  Neurological:     General: No focal deficit present.     Mental Status: He is alert.  Psychiatric:        Mood and Affect: Mood  normal.        Behavior: Behavior normal.     ED Results / Procedures / Treatments   Labs (all labs ordered are listed, but only abnormal results are displayed) Labs Reviewed  BASIC METABOLIC PANEL - Abnormal; Notable for the following components:      Result Value   Chloride 95 (*)    Glucose, Bld 114 (*)    BUN 29 (*)    Creatinine, Ser 8.57 (*)    GFR, Estimated 6 (*)  All other components within normal limits  CBC WITH DIFFERENTIAL/PLATELET - Abnormal; Notable for the following components:   RBC 3.59 (*)    Hemoglobin 10.8 (*)    HCT 34.2 (*)    RDW 16.2 (*)    All other components within normal limits    EKG None  Radiology DG Foot Complete Right Result Date: 08/11/2023 CLINICAL DATA:  Right great toe wound EXAM: RIGHT FOOT COMPLETE - 3+ VIEW COMPARISON:  Right foot x-ray 07/04/2022 FINDINGS: There is diffuse soft tissue swelling of the foot and ankle. Peripheral vascular calcifications are present. The bones are osteopenic. On the AP view there is questionable linear radiopaque foreign body within the medial soft tissues of the first toe versus on the skin surface measuring up to 7 mm in length. No acute fracture or cortical erosion identified. No dislocation. There are healed second through fourth proximal metatarsal fractures. IMPRESSION: 1. Diffuse soft tissue swelling of the foot and ankle. 2. Questionable linear radiopaque foreign body within the medial soft tissues of the first toe versus on the skin surface measuring up to 7 mm in length. 3. No acute fracture or cortical erosion identified. Electronically Signed   By: Darliss Cheney M.D.   On: 08/11/2023 20:18    Procedures Procedures    Medications Ordered in ED Medications  Tdap (BOOSTRIX) injection 0.5 mL (0.5 mLs Intramuscular Given 08/11/23 2020)    ED Course/ Medical Decision Making/ A&P                                 Medical Decision Making Amount and/or Complexity of Data Reviewed Labs:  ordered. Radiology: ordered.  Risk Prescription drug management.   This patient is a 62 y.o. male who presents to the ED for concern of right great toe wound, this involves an extensive number of treatment options, and is a complaint that carries with it a high risk of complications and morbidity. The emergent differential diagnosis prior to evaluation includes, but is not limited to,  trauma, cellulitis, osteomyelitis, sepsis . This is not an exhaustive differential.   Past Medical History / Co-morbidities / Social History:  has a past medical history of Anxiety, Arthritis, Atrial fibrillation (HCC), CHF (congestive heart failure), NYHA class III (HCC), Chronic kidney disease, Chronic systolic dysfunction of left ventricle, CVA (cerebral infarction), Dementia (HCC), Depression, Diabetes mellitus, Family history of coronary artery disease, GERD (gastroesophageal reflux disease), Hyperlipidemia, Hypertension, Left bundle branch block, Morbid obesity (HCC), Myocardial infarction (HCC), Nonischemic cardiomyopathy (HCC), Obesity (BMI 30-39.9), Obstructive sleep apnea, Premature ventricular contractions, Prostate cancer (HCC), and SOB (shortness of breath).  Additional history: Chart reviewed. Pertinent results include: Monday Wednesday Friday dialysis patient  Physical Exam: Physical exam performed. The pertinent findings include: Skin avulsion noted to the bottom of the right great toe per images above.  No signs of foreign body or active bleeding.  No nailbed involvement.  No obvious deformity.  No cellulitic changes or signs of infection.  Lab Tests: I ordered, and personally interpreted labs.  The pertinent results include: No leukocytosis.  Hemoglobin at baseline.  Creatinine at baseline.   Imaging Studies: I ordered imaging studies including dg right foot. I independently visualized and interpreted imaging which showed   1. Diffuse soft tissue swelling of the foot and ankle. 2.  Questionable linear radiopaque foreign body within the medial soft tissues of the first toe versus on the skin surface measuring up to 7  mm in length. 3. No acute fracture or cortical erosion identified.  I agree with the radiologist interpretation.   Medications: I ordered medication including tdap. I have reviewed the patients home medicines and have made adjustments as needed.   Disposition: After consideration of the diagnostic results and the patients response to treatment, I feel that emergency department workup does not suggest an emergent condition requiring admission or immediate intervention beyond what has been performed at this time. The plan is: Discharge with outpatient wound care follow-up and return precautions.  Patient's wound shows no signs of infection.  Likely due to unknown trauma given patient's limitations in sensation in this area.  Patient's wound has been cleaned and dressed with antibiotic ointment and a nonadherent dressing.  Given referral to the wound care center for follow-up as well.  Given patient's comorbid conditions, he may have issues getting this wound to heal in the future.  Discussed same with patient and family member at bedside who expressed understanding.  Emphasized importance of good wound care and close monitoring to evaluate for signs of infection. Evaluation and diagnostic testing in the emergency department does not suggest an emergent condition requiring admission or immediate intervention beyond what has been performed at this time.  Plan for discharge with close PCP follow-up.  Patient is understanding and amenable with plan, educated on red flag symptoms that would prompt immediate return.  Patient discharged in stable condition.  Final Clinical Impression(s) / ED Diagnoses Final diagnoses:  Open wound of right great toe, initial encounter    Rx / DC Orders ED Discharge Orders     None     An After Visit Summary was printed and given to  the patient.     Vear Clock 08/11/23 2147    Ernie Avena, MD 08/13/23 [phone]

## 2023-08-11 NOTE — ED Triage Notes (Signed)
Pt was soaking his right foot and spouse noticed there was a wound posterior of 1st toe. Pt's wife [facility] and they told them to come to ED. Pt has degloving of skin on right posterior of toe. The wound bedding is red.

## 2023-08-12 DIAGNOSIS — N186 End stage renal disease: Secondary | ICD-10-CM | POA: Diagnosis not present

## 2023-08-12 DIAGNOSIS — I420 Dilated cardiomyopathy: Secondary | ICD-10-CM | POA: Diagnosis not present

## 2023-08-12 DIAGNOSIS — E1129 Type 2 diabetes mellitus with other diabetic kidney complication: Secondary | ICD-10-CM | POA: Diagnosis not present

## 2023-08-12 DIAGNOSIS — N2581 Secondary hyperparathyroidism of renal origin: Secondary | ICD-10-CM | POA: Diagnosis not present

## 2023-08-12 DIAGNOSIS — D509 Iron deficiency anemia, unspecified: Secondary | ICD-10-CM | POA: Diagnosis not present

## 2023-08-12 DIAGNOSIS — D631 Anemia in chronic kidney disease: Secondary | ICD-10-CM | POA: Diagnosis not present

## 2023-08-12 DIAGNOSIS — I509 Heart failure, unspecified: Secondary | ICD-10-CM | POA: Diagnosis not present

## 2023-08-12 DIAGNOSIS — I132 Hypertensive heart and chronic kidney disease with heart failure and with stage 5 chronic kidney disease, or end stage renal disease: Secondary | ICD-10-CM | POA: Diagnosis not present

## 2023-08-12 DIAGNOSIS — E1122 Type 2 diabetes mellitus with diabetic chronic kidney disease: Secondary | ICD-10-CM | POA: Diagnosis not present

## 2023-08-12 DIAGNOSIS — Z992 Dependence on renal dialysis: Secondary | ICD-10-CM | POA: Diagnosis not present

## 2023-08-13 DIAGNOSIS — I420 Dilated cardiomyopathy: Secondary | ICD-10-CM | POA: Diagnosis not present

## 2023-08-13 DIAGNOSIS — I132 Hypertensive heart and chronic kidney disease with heart failure and with stage 5 chronic kidney disease, or end stage renal disease: Secondary | ICD-10-CM | POA: Diagnosis not present

## 2023-08-13 DIAGNOSIS — F411 Generalized anxiety disorder: Secondary | ICD-10-CM | POA: Diagnosis not present

## 2023-08-13 DIAGNOSIS — F02818 Dementia in other diseases classified elsewhere, unspecified severity, with other behavioral disturbance: Secondary | ICD-10-CM | POA: Diagnosis not present

## 2023-08-13 DIAGNOSIS — D631 Anemia in chronic kidney disease: Secondary | ICD-10-CM | POA: Diagnosis not present

## 2023-08-13 DIAGNOSIS — N186 End stage renal disease: Secondary | ICD-10-CM | POA: Diagnosis not present

## 2023-08-13 DIAGNOSIS — F331 Major depressive disorder, recurrent, moderate: Secondary | ICD-10-CM | POA: Diagnosis not present

## 2023-08-13 DIAGNOSIS — F6381 Intermittent explosive disorder: Secondary | ICD-10-CM | POA: Diagnosis not present

## 2023-08-13 DIAGNOSIS — E1122 Type 2 diabetes mellitus with diabetic chronic kidney disease: Secondary | ICD-10-CM | POA: Diagnosis not present

## 2023-08-13 DIAGNOSIS — I509 Heart failure, unspecified: Secondary | ICD-10-CM | POA: Diagnosis not present

## 2023-08-14 DIAGNOSIS — D509 Iron deficiency anemia, unspecified: Secondary | ICD-10-CM | POA: Diagnosis not present

## 2023-08-14 DIAGNOSIS — N2581 Secondary hyperparathyroidism of renal origin: Secondary | ICD-10-CM | POA: Diagnosis not present

## 2023-08-14 DIAGNOSIS — N186 End stage renal disease: Secondary | ICD-10-CM | POA: Diagnosis not present

## 2023-08-14 DIAGNOSIS — D631 Anemia in chronic kidney disease: Secondary | ICD-10-CM | POA: Diagnosis not present

## 2023-08-14 DIAGNOSIS — E1129 Type 2 diabetes mellitus with other diabetic kidney complication: Secondary | ICD-10-CM | POA: Diagnosis not present

## 2023-08-14 DIAGNOSIS — Z992 Dependence on renal dialysis: Secondary | ICD-10-CM | POA: Diagnosis not present

## 2023-08-15 DIAGNOSIS — I420 Dilated cardiomyopathy: Secondary | ICD-10-CM | POA: Diagnosis not present

## 2023-08-15 DIAGNOSIS — E1122 Type 2 diabetes mellitus with diabetic chronic kidney disease: Secondary | ICD-10-CM | POA: Diagnosis not present

## 2023-08-15 DIAGNOSIS — I509 Heart failure, unspecified: Secondary | ICD-10-CM | POA: Diagnosis not present

## 2023-08-15 DIAGNOSIS — D6869 Other thrombophilia: Secondary | ICD-10-CM | POA: Diagnosis not present

## 2023-08-15 DIAGNOSIS — I132 Hypertensive heart and chronic kidney disease with heart failure and with stage 5 chronic kidney disease, or end stage renal disease: Secondary | ICD-10-CM | POA: Diagnosis not present

## 2023-08-15 DIAGNOSIS — N186 End stage renal disease: Secondary | ICD-10-CM | POA: Diagnosis not present

## 2023-08-15 DIAGNOSIS — I959 Hypotension, unspecified: Secondary | ICD-10-CM | POA: Diagnosis not present

## 2023-08-15 DIAGNOSIS — E1140 Type 2 diabetes mellitus with diabetic neuropathy, unspecified: Secondary | ICD-10-CM | POA: Diagnosis not present

## 2023-08-15 DIAGNOSIS — L97509 Non-pressure chronic ulcer of other part of unspecified foot with unspecified severity: Secondary | ICD-10-CM | POA: Diagnosis not present

## 2023-08-15 DIAGNOSIS — D631 Anemia in chronic kidney disease: Secondary | ICD-10-CM | POA: Diagnosis not present

## 2023-08-16 DIAGNOSIS — Z992 Dependence on renal dialysis: Secondary | ICD-10-CM | POA: Diagnosis not present

## 2023-08-16 DIAGNOSIS — N186 End stage renal disease: Secondary | ICD-10-CM | POA: Diagnosis not present

## 2023-08-16 DIAGNOSIS — D631 Anemia in chronic kidney disease: Secondary | ICD-10-CM | POA: Diagnosis not present

## 2023-08-16 DIAGNOSIS — N2581 Secondary hyperparathyroidism of renal origin: Secondary | ICD-10-CM | POA: Diagnosis not present

## 2023-08-16 DIAGNOSIS — E1129 Type 2 diabetes mellitus with other diabetic kidney complication: Secondary | ICD-10-CM | POA: Diagnosis not present

## 2023-08-16 DIAGNOSIS — D509 Iron deficiency anemia, unspecified: Secondary | ICD-10-CM | POA: Diagnosis not present

## 2023-08-18 DIAGNOSIS — D509 Iron deficiency anemia, unspecified: Secondary | ICD-10-CM | POA: Diagnosis not present

## 2023-08-18 DIAGNOSIS — N2581 Secondary hyperparathyroidism of renal origin: Secondary | ICD-10-CM | POA: Diagnosis not present

## 2023-08-18 DIAGNOSIS — E1129 Type 2 diabetes mellitus with other diabetic kidney complication: Secondary | ICD-10-CM | POA: Diagnosis not present

## 2023-08-18 DIAGNOSIS — Z992 Dependence on renal dialysis: Secondary | ICD-10-CM | POA: Diagnosis not present

## 2023-08-18 DIAGNOSIS — D631 Anemia in chronic kidney disease: Secondary | ICD-10-CM | POA: Diagnosis not present

## 2023-08-18 DIAGNOSIS — N186 End stage renal disease: Secondary | ICD-10-CM | POA: Diagnosis not present

## 2023-08-20 DIAGNOSIS — N186 End stage renal disease: Secondary | ICD-10-CM | POA: Diagnosis not present

## 2023-08-20 DIAGNOSIS — N2581 Secondary hyperparathyroidism of renal origin: Secondary | ICD-10-CM | POA: Diagnosis not present

## 2023-08-20 DIAGNOSIS — E1129 Type 2 diabetes mellitus with other diabetic kidney complication: Secondary | ICD-10-CM | POA: Diagnosis not present

## 2023-08-20 DIAGNOSIS — D509 Iron deficiency anemia, unspecified: Secondary | ICD-10-CM | POA: Diagnosis not present

## 2023-08-20 DIAGNOSIS — Z992 Dependence on renal dialysis: Secondary | ICD-10-CM | POA: Diagnosis not present

## 2023-08-20 DIAGNOSIS — D631 Anemia in chronic kidney disease: Secondary | ICD-10-CM | POA: Diagnosis not present

## 2023-08-22 DIAGNOSIS — I509 Heart failure, unspecified: Secondary | ICD-10-CM | POA: Diagnosis not present

## 2023-08-22 DIAGNOSIS — I420 Dilated cardiomyopathy: Secondary | ICD-10-CM | POA: Diagnosis not present

## 2023-08-22 DIAGNOSIS — F02C0 Dementia in other diseases classified elsewhere, severe, without behavioral disturbance, psychotic disturbance, mood disturbance, and anxiety: Secondary | ICD-10-CM | POA: Diagnosis not present

## 2023-08-22 DIAGNOSIS — D631 Anemia in chronic kidney disease: Secondary | ICD-10-CM | POA: Diagnosis not present

## 2023-08-22 DIAGNOSIS — N186 End stage renal disease: Secondary | ICD-10-CM | POA: Diagnosis not present

## 2023-08-22 DIAGNOSIS — I132 Hypertensive heart and chronic kidney disease with heart failure and with stage 5 chronic kidney disease, or end stage renal disease: Secondary | ICD-10-CM | POA: Diagnosis not present

## 2023-08-22 DIAGNOSIS — E1122 Type 2 diabetes mellitus with diabetic chronic kidney disease: Secondary | ICD-10-CM | POA: Diagnosis not present

## 2023-08-23 DIAGNOSIS — N2581 Secondary hyperparathyroidism of renal origin: Secondary | ICD-10-CM | POA: Diagnosis not present

## 2023-08-23 DIAGNOSIS — Z992 Dependence on renal dialysis: Secondary | ICD-10-CM | POA: Diagnosis not present

## 2023-08-23 DIAGNOSIS — D509 Iron deficiency anemia, unspecified: Secondary | ICD-10-CM | POA: Diagnosis not present

## 2023-08-23 DIAGNOSIS — D631 Anemia in chronic kidney disease: Secondary | ICD-10-CM | POA: Diagnosis not present

## 2023-08-23 DIAGNOSIS — F02C0 Dementia in other diseases classified elsewhere, severe, without behavioral disturbance, psychotic disturbance, mood disturbance, and anxiety: Secondary | ICD-10-CM | POA: Diagnosis not present

## 2023-08-23 DIAGNOSIS — N186 End stage renal disease: Secondary | ICD-10-CM | POA: Diagnosis not present

## 2023-08-23 DIAGNOSIS — E1129 Type 2 diabetes mellitus with other diabetic kidney complication: Secondary | ICD-10-CM | POA: Diagnosis not present

## 2023-08-25 DIAGNOSIS — D631 Anemia in chronic kidney disease: Secondary | ICD-10-CM | POA: Diagnosis not present

## 2023-08-25 DIAGNOSIS — F02C0 Dementia in other diseases classified elsewhere, severe, without behavioral disturbance, psychotic disturbance, mood disturbance, and anxiety: Secondary | ICD-10-CM | POA: Diagnosis not present

## 2023-08-25 DIAGNOSIS — D509 Iron deficiency anemia, unspecified: Secondary | ICD-10-CM | POA: Diagnosis not present

## 2023-08-25 DIAGNOSIS — Z992 Dependence on renal dialysis: Secondary | ICD-10-CM | POA: Diagnosis not present

## 2023-08-25 DIAGNOSIS — N186 End stage renal disease: Secondary | ICD-10-CM | POA: Diagnosis not present

## 2023-08-25 DIAGNOSIS — N2581 Secondary hyperparathyroidism of renal origin: Secondary | ICD-10-CM | POA: Diagnosis not present

## 2023-08-25 DIAGNOSIS — E1129 Type 2 diabetes mellitus with other diabetic kidney complication: Secondary | ICD-10-CM | POA: Diagnosis not present

## 2023-08-26 DIAGNOSIS — I420 Dilated cardiomyopathy: Secondary | ICD-10-CM | POA: Diagnosis not present

## 2023-08-26 DIAGNOSIS — D631 Anemia in chronic kidney disease: Secondary | ICD-10-CM | POA: Diagnosis not present

## 2023-08-26 DIAGNOSIS — I509 Heart failure, unspecified: Secondary | ICD-10-CM | POA: Diagnosis not present

## 2023-08-26 DIAGNOSIS — E1122 Type 2 diabetes mellitus with diabetic chronic kidney disease: Secondary | ICD-10-CM | POA: Diagnosis not present

## 2023-08-26 DIAGNOSIS — F02C0 Dementia in other diseases classified elsewhere, severe, without behavioral disturbance, psychotic disturbance, mood disturbance, and anxiety: Secondary | ICD-10-CM | POA: Diagnosis not present

## 2023-08-26 DIAGNOSIS — I132 Hypertensive heart and chronic kidney disease with heart failure and with stage 5 chronic kidney disease, or end stage renal disease: Secondary | ICD-10-CM | POA: Diagnosis not present

## 2023-08-26 DIAGNOSIS — N186 End stage renal disease: Secondary | ICD-10-CM | POA: Diagnosis not present

## 2023-08-27 DIAGNOSIS — N186 End stage renal disease: Secondary | ICD-10-CM | POA: Diagnosis not present

## 2023-08-27 DIAGNOSIS — I509 Heart failure, unspecified: Secondary | ICD-10-CM | POA: Diagnosis not present

## 2023-08-27 DIAGNOSIS — I420 Dilated cardiomyopathy: Secondary | ICD-10-CM | POA: Diagnosis not present

## 2023-08-27 DIAGNOSIS — D509 Iron deficiency anemia, unspecified: Secondary | ICD-10-CM | POA: Diagnosis not present

## 2023-08-27 DIAGNOSIS — E1122 Type 2 diabetes mellitus with diabetic chronic kidney disease: Secondary | ICD-10-CM | POA: Diagnosis not present

## 2023-08-27 DIAGNOSIS — D631 Anemia in chronic kidney disease: Secondary | ICD-10-CM | POA: Diagnosis not present

## 2023-08-27 DIAGNOSIS — E1129 Type 2 diabetes mellitus with other diabetic kidney complication: Secondary | ICD-10-CM | POA: Diagnosis not present

## 2023-08-27 DIAGNOSIS — I132 Hypertensive heart and chronic kidney disease with heart failure and with stage 5 chronic kidney disease, or end stage renal disease: Secondary | ICD-10-CM | POA: Diagnosis not present

## 2023-08-27 DIAGNOSIS — N2581 Secondary hyperparathyroidism of renal origin: Secondary | ICD-10-CM | POA: Diagnosis not present

## 2023-08-27 DIAGNOSIS — Z992 Dependence on renal dialysis: Secondary | ICD-10-CM | POA: Diagnosis not present

## 2023-08-28 DIAGNOSIS — Z992 Dependence on renal dialysis: Secondary | ICD-10-CM | POA: Diagnosis not present

## 2023-08-28 DIAGNOSIS — N186 End stage renal disease: Secondary | ICD-10-CM | POA: Diagnosis not present

## 2023-08-28 DIAGNOSIS — F02C0 Dementia in other diseases classified elsewhere, severe, without behavioral disturbance, psychotic disturbance, mood disturbance, and anxiety: Secondary | ICD-10-CM | POA: Diagnosis not present

## 2023-08-28 DIAGNOSIS — T8620 Unspecified complication of heart transplant: Secondary | ICD-10-CM | POA: Diagnosis not present

## 2023-08-29 DIAGNOSIS — N186 End stage renal disease: Secondary | ICD-10-CM | POA: Diagnosis not present

## 2023-08-29 DIAGNOSIS — I132 Hypertensive heart and chronic kidney disease with heart failure and with stage 5 chronic kidney disease, or end stage renal disease: Secondary | ICD-10-CM | POA: Diagnosis not present

## 2023-08-29 DIAGNOSIS — I509 Heart failure, unspecified: Secondary | ICD-10-CM | POA: Diagnosis not present

## 2023-08-29 DIAGNOSIS — I420 Dilated cardiomyopathy: Secondary | ICD-10-CM | POA: Diagnosis not present

## 2023-08-29 DIAGNOSIS — D631 Anemia in chronic kidney disease: Secondary | ICD-10-CM | POA: Diagnosis not present

## 2023-08-29 DIAGNOSIS — E1122 Type 2 diabetes mellitus with diabetic chronic kidney disease: Secondary | ICD-10-CM | POA: Diagnosis not present

## 2023-08-30 DIAGNOSIS — N186 End stage renal disease: Secondary | ICD-10-CM | POA: Diagnosis not present

## 2023-08-30 DIAGNOSIS — I96 Gangrene, not elsewhere classified: Secondary | ICD-10-CM | POA: Diagnosis not present

## 2023-08-30 DIAGNOSIS — N2581 Secondary hyperparathyroidism of renal origin: Secondary | ICD-10-CM | POA: Diagnosis not present

## 2023-08-30 DIAGNOSIS — Z992 Dependence on renal dialysis: Secondary | ICD-10-CM | POA: Diagnosis not present

## 2023-08-30 DIAGNOSIS — D631 Anemia in chronic kidney disease: Secondary | ICD-10-CM | POA: Diagnosis not present

## 2023-08-30 DIAGNOSIS — E1129 Type 2 diabetes mellitus with other diabetic kidney complication: Secondary | ICD-10-CM | POA: Diagnosis not present

## 2023-08-30 DIAGNOSIS — D509 Iron deficiency anemia, unspecified: Secondary | ICD-10-CM | POA: Diagnosis not present

## 2023-09-01 DIAGNOSIS — G4733 Obstructive sleep apnea (adult) (pediatric): Secondary | ICD-10-CM | POA: Diagnosis not present

## 2023-09-01 DIAGNOSIS — I132 Hypertensive heart and chronic kidney disease with heart failure and with stage 5 chronic kidney disease, or end stage renal disease: Secondary | ICD-10-CM | POA: Diagnosis not present

## 2023-09-01 DIAGNOSIS — D631 Anemia in chronic kidney disease: Secondary | ICD-10-CM | POA: Diagnosis not present

## 2023-09-01 DIAGNOSIS — I951 Orthostatic hypotension: Secondary | ICD-10-CM | POA: Diagnosis not present

## 2023-09-01 DIAGNOSIS — Z7901 Long term (current) use of anticoagulants: Secondary | ICD-10-CM | POA: Diagnosis not present

## 2023-09-01 DIAGNOSIS — G894 Chronic pain syndrome: Secondary | ICD-10-CM | POA: Diagnosis not present

## 2023-09-01 DIAGNOSIS — N186 End stage renal disease: Secondary | ICD-10-CM | POA: Diagnosis not present

## 2023-09-01 DIAGNOSIS — F02818 Dementia in other diseases classified elsewhere, unspecified severity, with other behavioral disturbance: Secondary | ICD-10-CM | POA: Diagnosis not present

## 2023-09-01 DIAGNOSIS — I509 Heart failure, unspecified: Secondary | ICD-10-CM | POA: Diagnosis not present

## 2023-09-01 DIAGNOSIS — E7800 Pure hypercholesterolemia, unspecified: Secondary | ICD-10-CM | POA: Diagnosis not present

## 2023-09-01 DIAGNOSIS — I447 Left bundle-branch block, unspecified: Secondary | ICD-10-CM | POA: Diagnosis not present

## 2023-09-01 DIAGNOSIS — F0283 Dementia in other diseases classified elsewhere, unspecified severity, with mood disturbance: Secondary | ICD-10-CM | POA: Diagnosis not present

## 2023-09-01 DIAGNOSIS — I2510 Atherosclerotic heart disease of native coronary artery without angina pectoris: Secondary | ICD-10-CM | POA: Diagnosis not present

## 2023-09-01 DIAGNOSIS — I493 Ventricular premature depolarization: Secondary | ICD-10-CM | POA: Diagnosis not present

## 2023-09-01 DIAGNOSIS — I252 Old myocardial infarction: Secondary | ICD-10-CM | POA: Diagnosis not present

## 2023-09-01 DIAGNOSIS — F32A Depression, unspecified: Secondary | ICD-10-CM | POA: Diagnosis not present

## 2023-09-01 DIAGNOSIS — E1122 Type 2 diabetes mellitus with diabetic chronic kidney disease: Secondary | ICD-10-CM | POA: Diagnosis not present

## 2023-09-01 DIAGNOSIS — I420 Dilated cardiomyopathy: Secondary | ICD-10-CM | POA: Diagnosis not present

## 2023-09-01 DIAGNOSIS — G20C Parkinsonism, unspecified: Secondary | ICD-10-CM | POA: Diagnosis not present

## 2023-09-01 DIAGNOSIS — M1990 Unspecified osteoarthritis, unspecified site: Secondary | ICD-10-CM | POA: Diagnosis not present

## 2023-09-01 DIAGNOSIS — Z6836 Body mass index (BMI) 36.0-36.9, adult: Secondary | ICD-10-CM | POA: Diagnosis not present

## 2023-09-01 DIAGNOSIS — E1142 Type 2 diabetes mellitus with diabetic polyneuropathy: Secondary | ICD-10-CM | POA: Diagnosis not present

## 2023-09-01 DIAGNOSIS — I4891 Unspecified atrial fibrillation: Secondary | ICD-10-CM | POA: Diagnosis not present

## 2023-09-03 DIAGNOSIS — D509 Iron deficiency anemia, unspecified: Secondary | ICD-10-CM | POA: Diagnosis not present

## 2023-09-03 DIAGNOSIS — Z992 Dependence on renal dialysis: Secondary | ICD-10-CM | POA: Diagnosis not present

## 2023-09-03 DIAGNOSIS — N186 End stage renal disease: Secondary | ICD-10-CM | POA: Diagnosis not present

## 2023-09-03 DIAGNOSIS — I96 Gangrene, not elsewhere classified: Secondary | ICD-10-CM | POA: Diagnosis not present

## 2023-09-03 DIAGNOSIS — N2581 Secondary hyperparathyroidism of renal origin: Secondary | ICD-10-CM | POA: Diagnosis not present

## 2023-09-03 DIAGNOSIS — D631 Anemia in chronic kidney disease: Secondary | ICD-10-CM | POA: Diagnosis not present

## 2023-09-04 DIAGNOSIS — Z992 Dependence on renal dialysis: Secondary | ICD-10-CM | POA: Diagnosis not present

## 2023-09-04 DIAGNOSIS — N2581 Secondary hyperparathyroidism of renal origin: Secondary | ICD-10-CM | POA: Diagnosis not present

## 2023-09-04 DIAGNOSIS — N186 End stage renal disease: Secondary | ICD-10-CM | POA: Diagnosis not present

## 2023-09-04 DIAGNOSIS — D631 Anemia in chronic kidney disease: Secondary | ICD-10-CM | POA: Diagnosis not present

## 2023-09-04 DIAGNOSIS — D509 Iron deficiency anemia, unspecified: Secondary | ICD-10-CM | POA: Diagnosis not present

## 2023-09-04 DIAGNOSIS — I96 Gangrene, not elsewhere classified: Secondary | ICD-10-CM | POA: Diagnosis not present

## 2023-09-05 DIAGNOSIS — E1122 Type 2 diabetes mellitus with diabetic chronic kidney disease: Secondary | ICD-10-CM | POA: Diagnosis not present

## 2023-09-05 DIAGNOSIS — N186 End stage renal disease: Secondary | ICD-10-CM | POA: Diagnosis not present

## 2023-09-05 DIAGNOSIS — I420 Dilated cardiomyopathy: Secondary | ICD-10-CM | POA: Diagnosis not present

## 2023-09-05 DIAGNOSIS — I509 Heart failure, unspecified: Secondary | ICD-10-CM | POA: Diagnosis not present

## 2023-09-05 DIAGNOSIS — I132 Hypertensive heart and chronic kidney disease with heart failure and with stage 5 chronic kidney disease, or end stage renal disease: Secondary | ICD-10-CM | POA: Diagnosis not present

## 2023-09-05 DIAGNOSIS — D631 Anemia in chronic kidney disease: Secondary | ICD-10-CM | POA: Diagnosis not present

## 2023-09-06 ENCOUNTER — Emergency Department (HOSPITAL_BASED_OUTPATIENT_CLINIC_OR_DEPARTMENT_OTHER): Payer: Medicare Other

## 2023-09-06 ENCOUNTER — Other Ambulatory Visit: Payer: Self-pay

## 2023-09-06 ENCOUNTER — Emergency Department (HOSPITAL_COMMUNITY)
Admission: EM | Admit: 2023-09-06 | Discharge: 2023-09-07 | Disposition: A | Discharge status: Home / Self Care | Payer: Medicare Other | Attending: Emergency Medicine | Admitting: Emergency Medicine

## 2023-09-06 ENCOUNTER — Ambulatory Visit (INDEPENDENT_AMBULATORY_CARE_PROVIDER_SITE_OTHER): Payer: Medicare Other | Admitting: Podiatry

## 2023-09-06 ENCOUNTER — Encounter (HOSPITAL_COMMUNITY): Payer: Self-pay | Admitting: Internal Medicine

## 2023-09-06 ENCOUNTER — Emergency Department (HOSPITAL_COMMUNITY): Payer: Medicare Other

## 2023-09-06 ENCOUNTER — Encounter: Payer: Self-pay | Admitting: Podiatry

## 2023-09-06 DIAGNOSIS — F0390 Unspecified dementia without behavioral disturbance: Secondary | ICD-10-CM | POA: Insufficient documentation

## 2023-09-06 DIAGNOSIS — E1122 Type 2 diabetes mellitus with diabetic chronic kidney disease: Secondary | ICD-10-CM | POA: Diagnosis not present

## 2023-09-06 DIAGNOSIS — L819 Disorder of pigmentation, unspecified: Secondary | ICD-10-CM | POA: Diagnosis not present

## 2023-09-06 DIAGNOSIS — I509 Heart failure, unspecified: Secondary | ICD-10-CM | POA: Diagnosis not present

## 2023-09-06 DIAGNOSIS — Z79899 Other long term (current) drug therapy: Secondary | ICD-10-CM | POA: Insufficient documentation

## 2023-09-06 DIAGNOSIS — M7989 Other specified soft tissue disorders: Secondary | ICD-10-CM

## 2023-09-06 DIAGNOSIS — Z7901 Long term (current) use of anticoagulants: Secondary | ICD-10-CM | POA: Diagnosis not present

## 2023-09-06 DIAGNOSIS — Z96641 Presence of right artificial hip joint: Secondary | ICD-10-CM | POA: Insufficient documentation

## 2023-09-06 DIAGNOSIS — M85872 Other specified disorders of bone density and structure, left ankle and foot: Secondary | ICD-10-CM | POA: Diagnosis not present

## 2023-09-06 DIAGNOSIS — R600 Localized edema: Secondary | ICD-10-CM | POA: Insufficient documentation

## 2023-09-06 DIAGNOSIS — Z8546 Personal history of malignant neoplasm of prostate: Secondary | ICD-10-CM | POA: Insufficient documentation

## 2023-09-06 DIAGNOSIS — E11621 Type 2 diabetes mellitus with foot ulcer: Secondary | ICD-10-CM | POA: Diagnosis present

## 2023-09-06 DIAGNOSIS — Z96651 Presence of right artificial knee joint: Secondary | ICD-10-CM | POA: Insufficient documentation

## 2023-09-06 DIAGNOSIS — M2141 Flat foot [pes planus] (acquired), right foot: Secondary | ICD-10-CM | POA: Diagnosis not present

## 2023-09-06 DIAGNOSIS — I96 Gangrene, not elsewhere classified: Secondary | ICD-10-CM

## 2023-09-06 DIAGNOSIS — Z992 Dependence on renal dialysis: Secondary | ICD-10-CM | POA: Insufficient documentation

## 2023-09-06 DIAGNOSIS — I132 Hypertensive heart and chronic kidney disease with heart failure and with stage 5 chronic kidney disease, or end stage renal disease: Secondary | ICD-10-CM | POA: Insufficient documentation

## 2023-09-06 DIAGNOSIS — Z8673 Personal history of transient ischemic attack (TIA), and cerebral infarction without residual deficits: Secondary | ICD-10-CM | POA: Insufficient documentation

## 2023-09-06 DIAGNOSIS — M85871 Other specified disorders of bone density and structure, right ankle and foot: Secondary | ICD-10-CM | POA: Diagnosis not present

## 2023-09-06 DIAGNOSIS — N186 End stage renal disease: Secondary | ICD-10-CM

## 2023-09-06 DIAGNOSIS — R2243 Localized swelling, mass and lump, lower limb, bilateral: Secondary | ICD-10-CM | POA: Diagnosis present

## 2023-09-06 DIAGNOSIS — M19071 Primary osteoarthritis, right ankle and foot: Secondary | ICD-10-CM | POA: Diagnosis not present

## 2023-09-06 DIAGNOSIS — Z7189 Other specified counseling: Secondary | ICD-10-CM

## 2023-09-06 DIAGNOSIS — L97523 Non-pressure chronic ulcer of other part of left foot with necrosis of muscle: Secondary | ICD-10-CM

## 2023-09-06 DIAGNOSIS — M19072 Primary osteoarthritis, left ankle and foot: Secondary | ICD-10-CM | POA: Diagnosis not present

## 2023-09-06 DIAGNOSIS — I739 Peripheral vascular disease, unspecified: Secondary | ICD-10-CM

## 2023-09-06 LAB — CBC WITH DIFFERENTIAL/PLATELET
Abs Immature Granulocytes: 0.04 10*3/uL (ref 0.00–0.07)
Basophils Absolute: 0.1 10*3/uL (ref 0.0–0.1)
Basophils Relative: 1 %
Eosinophils Absolute: 0.1 10*3/uL (ref 0.0–0.5)
Eosinophils Relative: 2 %
HCT: 33.4 % — ABNORMAL LOW (ref 39.0–52.0)
Hemoglobin: 10.5 g/dL — ABNORMAL LOW (ref 13.0–17.0)
Immature Granulocytes: 1 %
Lymphocytes Relative: 16 %
Lymphs Abs: 1 10*3/uL (ref 0.7–4.0)
MCH: 30.3 pg (ref 26.0–34.0)
MCHC: 31.4 g/dL (ref 30.0–36.0)
MCV: 96.3 fL (ref 80.0–100.0)
Monocytes Absolute: 0.8 10*3/uL (ref 0.1–1.0)
Monocytes Relative: 12 %
Neutro Abs: 4.5 10*3/uL (ref 1.7–7.7)
Neutrophils Relative %: 68 %
Platelets: 197 10*3/uL (ref 150–400)
RBC: 3.47 MIL/uL — ABNORMAL LOW (ref 4.22–5.81)
RDW: 16.5 % — ABNORMAL HIGH (ref 11.5–15.5)
WBC: 6.5 10*3/uL (ref 4.0–10.5)
nRBC: 0 % (ref 0.0–0.2)

## 2023-09-06 LAB — COMPREHENSIVE METABOLIC PANEL
ALT: 6 U/L (ref 0–44)
AST: 9 U/L — ABNORMAL LOW (ref 15–41)
Albumin: 2.8 g/dL — ABNORMAL LOW (ref 3.5–5.0)
Alkaline Phosphatase: 78 U/L (ref 38–126)
Anion gap: 15 (ref 5–15)
BUN: 28 mg/dL — ABNORMAL HIGH (ref 8–23)
CO2: 26 mmol/L (ref 22–32)
Calcium: 9.5 mg/dL (ref 8.9–10.3)
Chloride: 95 mmol/L — ABNORMAL LOW (ref 98–111)
Creatinine, Ser: 8.27 mg/dL — ABNORMAL HIGH (ref 0.61–1.24)
GFR, Estimated: 7 mL/min — ABNORMAL LOW (ref >60)
Glucose, Bld: 78 mg/dL (ref 70–99)
Potassium: 4.2 mmol/L (ref 3.5–5.1)
Sodium: 136 mmol/L (ref 135–145)
Total Bilirubin: 0.9 mg/dL (ref 0.0–1.2)
Total Protein: 6 g/dL — ABNORMAL LOW (ref 6.5–8.1)

## 2023-09-06 LAB — I-STAT CG4 LACTIC ACID, ED: Lactic Acid, Venous: 1.7 mmol/L (ref 0.5–1.9)

## 2023-09-06 LAB — PROTIME-INR
INR: 1.2 (ref 0.8–1.2)
Prothrombin Time: 15.6 s — ABNORMAL HIGH (ref 11.4–15.2)

## 2023-09-06 LAB — APTT: aPTT: 34 s (ref 24–36)

## 2023-09-06 LAB — HEPATITIS B SURFACE ANTIGEN: Hepatitis B Surface Ag: NONREACTIVE

## 2023-09-06 MED ORDER — ANTICOAGULANT SODIUM CITRATE 4% (200MG/5ML) IV SOLN
5.0000 mL | Status: DC | PRN
  Filled 2023-09-06 (×3): qty 5

## 2023-09-06 MED ORDER — MIDODRINE HCL 5 MG PO TABS
10.0000 mg | ORAL_TABLET | Freq: Three times a day (TID) | ORAL | Status: AC
  Administered 2023-09-06 – 2023-09-07 (×2): 10 mg via ORAL
  Filled 2023-09-06 (×2): qty 2

## 2023-09-06 MED ORDER — CHLORHEXIDINE GLUCONATE CLOTH 2 % EX PADS: 6.0000 | MEDICATED_PAD | Freq: Every day | CUTANEOUS | Status: DC

## 2023-09-06 MED ORDER — HEPARIN SODIUM (PORCINE) 1000 UNIT/ML DIALYSIS: 1600.0000 [IU] | Freq: Once | INTRAMUSCULAR | Status: DC

## 2023-09-06 MED ORDER — OXYCODONE HCL 5 MG PO TABS
5.0000 mg | ORAL_TABLET | ORAL | Status: DC | PRN
  Administered 2023-09-06: 5 mg via ORAL
  Filled 2023-09-06: qty 1

## 2023-09-06 MED ORDER — ACETAMINOPHEN 500 MG PO TABS
1000.0000 mg | ORAL_TABLET | Freq: Three times a day (TID) | ORAL | Status: DC | PRN
  Administered 2023-09-06: 1000 mg via ORAL
  Filled 2023-09-06: qty 2

## 2023-09-06 MED ORDER — HEPARIN SODIUM (PORCINE) 1000 UNIT/ML DIALYSIS: 1000.0000 [IU] | INTRAMUSCULAR | Status: DC | PRN

## 2023-09-06 MED ORDER — ALTEPLASE 2 MG IJ SOLR: 2.0000 mg | Freq: Once | INTRAMUSCULAR | Status: DC | PRN

## 2023-09-06 MED ORDER — MIDODRINE HCL 5 MG PO TABS: 10.0000 mg | ORAL_TABLET | Freq: Once | ORAL | Status: DC

## 2023-09-06 MED ORDER — NEPRO/CARBSTEADY PO LIQD
237.0000 mL | ORAL | Status: DC | PRN
  Filled 2023-09-06: qty 237

## 2023-09-06 MED ORDER — LIDOCAINE HCL (PF) 1 % IJ SOLN: 5.0000 mL | INTRAMUSCULAR | Status: DC | PRN

## 2023-09-06 MED ORDER — LIDOCAINE-PRILOCAINE 2.5-2.5 % EX CREA: 1.0000 | TOPICAL_CREAM | CUTANEOUS | Status: DC | PRN

## 2023-09-06 MED ORDER — PENTAFLUOROPROP-TETRAFLUOROETH EX AERO: 1.0000 | INHALATION_SPRAY | CUTANEOUS | Status: DC | PRN

## 2023-09-06 NOTE — ED Provider Notes (Signed)
 Getting dialysis then D/C. All follow up in place. Consults done. Physical Exam  BP 99/67 (BP Location: Left Arm)   Pulse 80   Temp 98.2 F (36.8 C) (Oral)   Resp 15   SpO2 99%   Physical Exam  Procedures  Procedures  ED Course / MDM   Clinical Course as of 09/06/23 1623  Fri Sep 06, 2023  0927 Lactic Acid, Venous: 1.7 wnl [HN]  1034 DG Foot 2 Views Left *No acute osseous abnormality. *Small focal soft tissue defect along the plantar aspect of the midfoot. However, no underlying bone erosions. Diffuse soft tissue swelling over the dorsum of the foot.   [HN]  1034 DG Foot 2 Views Right *No acute osseous abnormality identified. *Diffuse soft tissue swelling over the dorsum of the foot however, no evidence of air within the soft tissue or soft tissue defect. No underlying bone erosions.   [HN]  1035 CBC with Differential(!) Unremarkable. No leukocytosis. [HN]  1039 Patient insists on taking his home medications, to include his depakote , midodrine , and transplant meds (mycophenolate  and cyclosporine ). I okayed them except the eliquis  for now.  [HN]  1056 Patient now in ultrasound [HN]  1234 Awaiting read from [facility] [HN]  1258 [facility] not read yet, short staffed. Will consult to vascular. [HN]  1318 Vascular states they will see him inpatient. [HN]  1515 Patient was accepted for admission by Dr. Barbarann, who states that vascular stated nothing acutely to be done. Podiatry can follow as outpatient. Nephrology will do HD overnight and then can be DC'd. Dr. Barbarann is writing consult note and patient can be reevaluated and discharged after dialysis. [HN]    Clinical Course User Index [HN] Franklyn Sid SAILOR, MD   Medical Decision Making Amount and/or Complexity of Data Reviewed Labs: ordered. Decision-making details documented in ED Course. Radiology: ordered. Decision-making details documented in ED Course.  Risk OTC drugs. Prescription drug management.          Armenta Canning, MD 09/09/23 [phone]

## 2023-09-06 NOTE — ED Notes (Signed)
 This RN called vascular regarding scan, per Irving Burton in vascular scan hasn't been read yet.

## 2023-09-06 NOTE — ED Triage Notes (Signed)
 Wife stated, he was sent here by Dr. Allena Katz for a circulation study. The symptoms got worse for about a week. All of his toes are black and are swollen. Not sure if he might be septic or just the circulation.
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
## 2023-09-06 NOTE — ED Notes (Signed)
 Pt in dialysis
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
## 2023-09-06 NOTE — ED Notes (Signed)
 Taken to vascular US by transporter.

## 2023-09-06 NOTE — ED Provider Notes (Signed)
 [place] [facility] Provider Note   CSN: [id number] Arrival date & time: 09/06/23  9163     History  Chief Complaint  Patient presents with   Foot Pain   Foot Swelling    [name] is a 63 y.o. male with PMH as listed below who presents with wife who provides additional history. Patient has had progressive discoloration of toes of the left foot for approx one week. Toes are reported to be black/swollen. BL feet/ankles very swollen as well, unable to get any of his shoes on. Seen by podiatry Dr. Tobie this morning and sent to ED. Patient denies any pain, states he has neuropathy at baseline. Denies trauma to the foot, fevers/chills, N/V, any other sxs.   Per podiatry note this AM:  Patient states his bilateral toes feel numbness in them. The left may have fungus and the right he had a blood blisters bust . Patient left foot feel hot and turning black . And his right foot feels cold patient wife states that she believes he has an infection in his left foot.  Patient bilateral feet are swollen.  Patient has neuropathy  Plan: Left forefoot beginning signs of gangrene with possible peripheral vascular disease -All questions and concerns were discussed with the patient and acute detail -Given the acute onset of this forefoot discoloration I believe patient will benefit from ABIs PVRs to assess the flow.  I encouraged him to go to the emergency room for ABIs and vascular consultation. -They deny any heat burn or frostbite environment. -Also encouraged Betadine  wet-to-dry dressing -Patient is a high risk of undergoing amputation possible transmetatarsal amputation if it continues to worsen  Past Medical History:  Diagnosis Date   Anxiety    Arthritis    Atrial fibrillation (HCC)    CHF (congestive heart failure), NYHA class III (HCC)    s/p heart transplant   Chronic kidney disease    end stage   Chronic systolic dysfunction of left  ventricle    CVA (cerebral infarction)    Dementia (HCC)    Depression    Diabetes mellitus    PMH; Prior to heart transplant   Family history of coronary artery disease    in both parents   GERD (gastroesophageal reflux disease)    Hyperlipidemia    Hypertension    Left bundle branch block    Morbid obesity (HCC)    status post lap band   Myocardial infarction [facility])    prior to heart transplant   Nonischemic cardiomyopathy (HCC)    prior to heart transplant   Obesity (BMI 30-39.9)    Obstructive sleep apnea    no longer needs CPAP after heart transplant per pt   Premature ventricular contractions    Prostate cancer (HCC)    SOB (shortness of breath)        Home Medications Prior to Admission medications   Medication Sig Start Date End Date Taking? Authorizing Provider  acetaminophen  (TYLENOL ) 500 MG tablet Take 1,000 mg by mouth in the morning and at bedtime.    [provider]  ARIPiprazole  (ABILIFY ) 10 MG tablet Take 10 mg by mouth every morning.    [provider]  cycloSPORINE  modified (NEORAL ) 100 MG capsule Take 100 mg by mouth See admin instructions. Take with 25 mg for a total of 125 mg twice daily    [provider]  cycloSPORINE  modified 25 MG CAPS Take 25 mg by mouth See admin  instructions. Take with 100 mg for a total of 125 mg twice daily 05/09/23   [provider]  divalproex  (DEPAKOTE ) 125 MG DR tablet Take 250 mg by mouth in morning and 500 mg by mouth at night Patient taking differently: Take 250-500 mg by mouth See admin instructions. Take 250 mg by mouth in morning and 500 mg by mouth at night 12/17/22   Onita Duos, MD  DULoxetine  (CYMBALTA ) 60 MG capsule Take 1 capsule (60 mg total) by mouth daily. 11/01/22   Onita Duos, MD  ELIQUIS  2.5 MG TABS tablet Take 1 tablet (2.5 mg total) by mouth 2 (two) times daily. 04/30/21   Nieves Cough, MD  hydrocortisone  (ANUSOL -HC) 2.5 % rectal cream Apply 1 Application topically as needed  for hemorrhoids or anal itching. 11/01/22   [provider]  hydrOXYzine  (ATARAX ) 25 MG tablet Take 25 mg by mouth 2 (two) times daily.    [provider]  ibuprofen (ADVIL) 200 MG tablet Take 400 mg by mouth as needed for moderate pain (pain score 4-6) or mild pain (pain score 1-3).    [provider]  midodrine  (PROAMATINE ) 10 MG tablet Take 1 tablet (10 mg total) by mouth 3 (three) times daily. Patient taking differently: Take 10 mg by mouth every Monday, Wednesday, and Friday with hemodialysis. Take 10 mg twice a day on non-dialysis days 04/27/21   Nieves Cough, MD  mycophenolate  (MYFORTIC ) 180 MG EC tablet Take 720 mg by mouth 2 (two) times daily.    [provider]  naloxone [facility]) nasal spray 4 mg/0.1 mL Place 0.4 mg into the nose once.    [provider]  ondansetron  (ZOFRAN ) 4 MG tablet Take 1 tablet (4 mg total) by mouth every 6 (six) hours as needed for nausea. Patient taking differently: Take 4 mg by mouth as needed for nausea. 07/23/17   Swinteck, Redell, MD  oxyCODONE -acetaminophen  (PERCOCET) 5-325 MG tablet Take 1 tablet by mouth every 6 (six) hours as needed for severe pain (pain score 7-10). Do not take with other opiate pain medication 07/04/23 07/03/24  Baglia, Corrina, PA-C  pregabalin  (LYRICA ) 50 MG capsule Take 50 mg by mouth 2 (two) times daily.    [provider]  rosuvastatin  (CRESTOR ) 10 MG tablet Take 1 tablet (10 mg total) by mouth daily. 07/06/22   Samtani, Jai-Gurmukh, MD  sevelamer  carbonate (RENVELA ) 800 MG tablet Take 2 tablets (1,600 mg total) by mouth 3 (three) times daily with meals. Patient taking differently: Take 2,400 mg by mouth 3 (three) times daily with meals. 800 mg with snacks 11/11/18   Shona Terry SAILOR, DO  sucroferric oxyhydroxide (VELPHORO) 500 MG chewable tablet Chew 500 mg by mouth in the morning and at bedtime.    [provider]  traZODone  (DESYREL ) 100 MG tablet Take 100 mg by mouth at  bedtime. 08/18/22   [provider]      Allergies    Heparin , Iodinated contrast media, and Penicillin g potassium [penicillin g]    Review of Systems   Review of Systems A 10 point review of systems was performed and is negative unless otherwise reported in HPI.  Physical Exam Updated Vital Signs There were no vitals taken for this visit. Physical Exam General: Normal appearing elderly male, lying in bed.  HEENT: Sclera anicteric, MMM, trachea midline.  Cardiology: RRR, no murmurs/rubs/gallops.  Resp: Normal respiratory rate and effort. Abd: Soft, non-tender, non-distended. No rebound tenderness or guarding.  GU: Deferred. MSK: 4+ pitting edema in  BL feet/ankles symmetrically. Very difficult to palpate BL DP/PT pulses due to swelling, but BL feet are warm with delayed cap refill. No signs of trauma. All 5 of left toes with darkened discoloration compared to right (see images below) with some blisters noted on toes 2-3 on ventral surface. No bleeding or purulent wounds. Podiatrist had placed iodine  and wrapped toes prior to arrival.  Skin: warm, dry. Neuro: A&Ox4, CNs II-XII grossly intact. MAEs. Sensation decreased in BL LEs.          ED Results / Procedures / Treatments   Labs (all labs ordered are listed, but only abnormal results are displayed) Labs Reviewed  COMPREHENSIVE METABOLIC PANEL  CBC WITH DIFFERENTIAL/PLATELET  PROTIME-INR  APTT  I-STAT CG4 LACTIC ACID, ED    EKG None  Radiology No results found.  Procedures Procedures    Medications Ordered in ED Medications  oxyCODONE  (Oxy IR/ROXICODONE ) immediate release tablet 5 mg (has no administration in time range)  acetaminophen  (TYLENOL ) tablet 1,000 mg (has no administration in time range)    ED Course/ Medical Decision Making/ A&P                          Medical Decision Making Amount and/or Complexity of Data Reviewed Labs: ordered. Decision-making details documented in ED  Course. Radiology: ordered. Decision-making details documented in ED Course.  Risk OTC drugs. Prescription drug management.    This patient presents to the ED for concern of discolored toes, this involves an extensive number of treatment options, and is a complaint that carries with it a high risk of complications and morbidity.  I considered the following differential and admission for this acute, potentially life threatening condition.   MDM:    Consider dry gangrene vs arterial ischemia vs osteomyelitis. With blisters must also consider necrotizing infection. XR doesn't show any broken bones. Will possibly need MRI to r/o osteo as well as vascular/podiatry consults for limb salvage.  No fever or signs of sepsis.   Clinical Course as of 09/07/23 2100  Fri Sep 06, 2023  0927 Lactic Acid, Venous: 1.7 wnl [HN]  1034 DG Foot 2 Views Left *No acute osseous abnormality. *Small focal soft tissue defect along the plantar aspect of the midfoot. However, no underlying bone erosions. Diffuse soft tissue swelling over the dorsum of the foot.   [HN]  1034 DG Foot 2 Views Right *No acute osseous abnormality identified. *Diffuse soft tissue swelling over the dorsum of the foot however, no evidence of air within the soft tissue or soft tissue defect. No underlying bone erosions.   [HN]  1035 CBC with Differential(!) Unremarkable. No leukocytosis. [HN]  1039 Patient insists on taking his home medications, to include his depakote , midodrine , and transplant meds (mycophenolate  and cyclosporine ). I okayed them except the eliquis  for now.  [HN]  1056 Patient now in ultrasound [HN]  1234 Awaiting read from ABIs [HN]  1258 ABIs not read yet, short staffed. Will consult to vascular. [HN]  1318 Vascular states they will see him inpatient. [HN]  1515 Patient was accepted for admission by Dr. Barbarann, who states that vascular stated nothing acutely to be done. Podiatry can follow as outpatient.  Nephrology will do HD overnight and then can be DC'd. Dr. Barbarann is writing consult note and patient can be reevaluated and discharged after dialysis. [HN]    Clinical Course User Index [HN] Franklyn Sid SAILOR, MD    Labs: I Ordered, and personally interpreted labs.  The pertinent results include:  those listed above  Imaging Studies ordered: I ordered imaging studies including BL Feet XR, BL ankle ABI I independently visualized and interpreted imaging. I agree with the radiologist interpretation  Additional history obtained from chart review, wife at bedside.    Reevaluation: After the interventions noted above, I reevaluated the patient and found that they have :stayed the same  Social Determinants of Health: Lives with wife  Disposition:  Patient will be receiving HD with nephrology and then reevaluated in ED. Can then likely be discharged to follow with vascular and podiatry in outpatient setting.    Co morbidities that complicate the patient evaluation  Past Medical History:  Diagnosis Date   Anxiety    Arthritis    Atrial fibrillation (HCC)    CHF (congestive heart failure), NYHA class III (HCC)    s/p heart transplant   Chronic kidney disease    end stage   Chronic systolic dysfunction of left ventricle    CVA (cerebral infarction)    Dementia (HCC)    Depression    Diabetes mellitus    PMH; Prior to heart transplant   Family history of coronary artery disease    in both parents   GERD (gastroesophageal reflux disease)    Hyperlipidemia    Hypertension    Left bundle branch block    Morbid obesity (HCC)    status post lap band   Myocardial infarction [facility])    prior to heart transplant   Nonischemic cardiomyopathy (HCC)    prior to heart transplant   Obesity (BMI 30-39.9)    Obstructive sleep apnea    no longer needs CPAP after heart transplant per pt   Premature ventricular contractions    Prostate cancer (HCC)    SOB (shortness of breath)       Medicines Meds ordered this encounter  Medications   oxyCODONE  (Oxy IR/ROXICODONE ) immediate release tablet 5 mg    Refill:  0   acetaminophen  (TYLENOL ) tablet 1,000 mg    I have reviewed the patients home medicines and have made adjustments as needed  Problem List / ED Course: Problem List Items Addressed This Visit   None Visit Diagnoses       Discoloration of skin of toe    -  Primary     Foot swelling                       This note was created using dictation software, which may contain spelling or grammatical errors.    Franklyn Sid SAILOR, MD 09/07/23 2102

## 2023-09-06 NOTE — Progress Notes (Signed)
 Contacted by nephrologist with request for pt to receive out-pt HD tomorrow at clinic if possible. Contacted [facility] and advised that staff that make that decision had already left for the day due to weather. Nephrologist made aware of this info.   Randine Mungo Renal Navigator [phone]

## 2023-09-06 NOTE — Progress Notes (Signed)
 Subjective:  Patient ID: [name], male    DOB: 1961-07-11,  MRN: [id number]  Chief Complaint  Patient presents with   Numbness    Patient states his bilateral toes feel numbness in them. The left may have fungus and the right he had a blood blisters bust . Patient left foot feel hot and turning black . And his right foot feels cold patient wife states that she believes he has an infection in his left foot.  Patient bilateral feet are swollen.  Patient has neuropathy     63 y.o. male presents with the above complaint.  Patient presents with complaint left forefoot gangrene/blood blisters.  Patient states that is starting a little bit black wanted get it evaluated he has not seen MRIs prior to seeing me they are swollen as well.  He has neuropathy he also has Charcot foot.  He was a prior diabetic.   Review of Systems: Negative except as noted in the HPI. Denies N/V/F/Ch.  Past Medical History:  Diagnosis Date   Anxiety    Arthritis    Atrial fibrillation (HCC)    CHF (congestive heart failure), NYHA class III (HCC)    s/p heart transplant   Chronic kidney disease    end stage   Chronic systolic dysfunction of left ventricle    CVA (cerebral infarction)    Dementia (HCC)    Depression    Diabetes mellitus    PMH; Prior to heart transplant   Family history of coronary artery disease    in both parents   GERD (gastroesophageal reflux disease)    Hyperlipidemia    Hypertension    Left bundle branch block    Morbid obesity (HCC)    status post lap band   Myocardial infarction [facility])    prior to heart transplant   Nonischemic cardiomyopathy (HCC)    prior to heart transplant   Obesity (BMI 30-39.9)    Obstructive sleep apnea    no longer needs CPAP after heart transplant per pt   Premature ventricular contractions    Prostate cancer (HCC)    SOB (shortness of breath)     Current Outpatient Medications:    acetaminophen  (TYLENOL ) 500 MG tablet, Take 1,000 mg by mouth  in the morning and at bedtime., Disp: , Rfl:    ARIPiprazole  (ABILIFY ) 10 MG tablet, Take 10 mg by mouth every morning., Disp: , Rfl:    cycloSPORINE  modified (NEORAL ) 100 MG capsule, Take 100 mg by mouth See admin instructions. Take with 25 mg for a total of 125 mg twice daily, Disp: , Rfl:    cycloSPORINE  modified 25 MG CAPS, Take 25 mg by mouth See admin instructions. Take with 100 mg for a total of 125 mg twice daily, Disp: , Rfl:    divalproex  (DEPAKOTE ) 125 MG DR tablet, Take 250 mg by mouth in morning and 500 mg by mouth at night (Patient taking differently: Take 250-500 mg by mouth See admin instructions. Take 250 mg by mouth in morning and 500 mg by mouth at night), Disp: 180 tablet, Rfl: 11   DULoxetine  (CYMBALTA ) 60 MG capsule, Take 1 capsule (60 mg total) by mouth daily., Disp: 90 capsule, Rfl: 3   ELIQUIS  2.5 MG TABS tablet, Take 1 tablet (2.5 mg total) by mouth 2 (two) times daily., Disp: 60 tablet, Rfl:    hydrocortisone  (ANUSOL -HC) 2.5 % rectal cream, Apply 1 Application topically as needed for hemorrhoids or anal itching., Disp: , Rfl:  hydrOXYzine  (ATARAX ) 25 MG tablet, Take 25 mg by mouth 2 (two) times daily., Disp: , Rfl:    ibuprofen (ADVIL) 200 MG tablet, Take 400 mg by mouth as needed for moderate pain (pain score 4-6) or mild pain (pain score 1-3)., Disp: , Rfl:    midodrine  (PROAMATINE ) 10 MG tablet, Take 1 tablet (10 mg total) by mouth 3 (three) times daily. (Patient taking differently: Take 10 mg by mouth every Monday, Wednesday, and Friday with hemodialysis. Take 10 mg twice a day on non-dialysis days), Disp: , Rfl:    mycophenolate  (MYFORTIC ) 180 MG EC tablet, Take 720 mg by mouth 2 (two) times daily., Disp: , Rfl:    naloxone (NARCAN) nasal spray 4 mg/0.1 mL, Place 0.4 mg into the nose once., Disp: , Rfl:    ondansetron  (ZOFRAN ) 4 MG tablet, Take 1 tablet (4 mg total) by mouth every 6 (six) hours as needed for nausea. (Patient taking differently: Take 4 mg by mouth as  needed for nausea.), Disp: 20 tablet, Rfl: 0   oxyCODONE -acetaminophen  (PERCOCET) 5-325 MG tablet, Take 1 tablet by mouth every 6 (six) hours as needed for severe pain (pain score 7-10). Do not take with other opiate pain medication, Disp: 20 tablet, Rfl: 0   pregabalin  (LYRICA ) 50 MG capsule, Take 50 mg by mouth 2 (two) times daily., Disp: , Rfl:    rosuvastatin  (CRESTOR ) 10 MG tablet, Take 1 tablet (10 mg total) by mouth daily., Disp: 30 tablet, Rfl: 1   sevelamer  carbonate (RENVELA ) 800 MG tablet, Take 2 tablets (1,600 mg total) by mouth 3 (three) times daily with meals. (Patient taking differently: Take 2,400 mg by mouth 3 (three) times daily with meals. 800 mg with snacks), Disp: 120 tablet, Rfl: 0   sucroferric oxyhydroxide (VELPHORO) 500 MG chewable tablet, Chew 500 mg by mouth in the morning and at bedtime., Disp: , Rfl:    traZODone  (DESYREL ) 100 MG tablet, Take 100 mg by mouth at bedtime., Disp: , Rfl:   Current Facility-Administered Medications:    0.9 %  sodium chloride  infusion, 250 mL, Intravenous, PRN, Gretta Lonni PARAS, MD   sodium chloride  flush (NS) 0.9 % injection 3 mL, 3 mL, Intravenous, Q12H, Gretta Lonni PARAS, MD  Social History   Tobacco Use  Smoking Status Never  Smokeless Tobacco Former   Types: Snuff    Allergies  Allergen Reactions   Heparin  Nausea Only, Swelling and Other (See Comments)    * * HIT * *  SWELLING REACTION UNSPECIFIED   DIAPHORESIS    Iodinated Contrast Media    Penicillin G Potassium [Penicillin G] Nausea Only and Other (See Comments)    DIAPHORESIS High Doses   Objective:  There were no vitals filed for this visit. There is no height or weight on file to calculate BMI. Constitutional Well developed. Well nourished.  Vascular Dorsalis pedis pulses nonpalpable bilaterally Posterior tibial pulses nonpalpable bilaterally Capillary refill sluggish to all digits.  No cyanosis or clubbing noted. Pedal hair growth normal.   Neurologic Normal speech. Oriented to person, place, and time. Epicritic sensation to light touch grossly present bilaterally.  Dermatologic Nails well groomed and normal in appearance. No open wounds. No skin lesions.  Orthopedic: Gangrenous changes noted to the forefoot.  Open blisters noted.  Foot feels warm to touch.  No active signs of infection noted.   Radiographs: None Assessment:   1. Gangrene of toe of left foot (HCC)   2. Peripheral vascular disease (HCC)    Plan:  Patient was evaluated and treated and all questions answered.  Left forefoot beginning signs of gangrene with possible peripheral vascular disease -All questions and concerns were discussed with the patient and acute detail -Given the acute onset of this forefoot discoloration I believe patient will benefit from ABIs PVRs to assess the flow.  I encouraged him to go to the emergency room for ABIs and vascular consultation. -They deny any heat burn or frostbite environment. -Also encouraged Betadine  wet-to-dry dressing -Patient is a high risk of undergoing amputation possible transmetatarsal amputation if it continues to worsen  No follow-ups on file.

## 2023-09-06 NOTE — ED Notes (Signed)
 Pt returned to room via stretcher from hemodialysis. RR even and nonlabored. No acute distress noted.

## 2023-09-06 NOTE — Consult Note (Addendum)
 ED Consult  History of Present Illness:  Patient is a 63 y.o. year old male who presents for evaluation of arterial inflow with left foot ischemia.  He is followed by DPM Dr. Franky SQUIBB. Patel.  He has known peripheral neuropathy with DM, CKD, history of heart transplant, dementia, hyperlipidemia, and Afib.    We have been consulted to evaluate his blood flow.  His family is with him.  He does not walk.  He states the left foot toes turn dark a few days ago.  He denies rest pain and motor of the toes is still intact.  He denies fever and chills.             Past Medical History:  Diagnosis Date   Anxiety    Arthritis    Atrial fibrillation (HCC)    CHF (congestive heart failure), NYHA class III (HCC)    s/p heart transplant   Chronic kidney disease    end stage   Chronic systolic dysfunction of left ventricle    CVA (cerebral infarction)    Dementia (HCC)    Depression    Diabetes mellitus    PMH; Prior to heart transplant   Family history of coronary artery disease    in both parents   GERD (gastroesophageal reflux disease)    Hyperlipidemia    Hypertension    Left bundle branch block    Morbid obesity (HCC)    status post lap band   Myocardial infarction [facility])    prior to heart transplant   Nonischemic cardiomyopathy (HCC)    prior to heart transplant   Obesity (BMI 30-39.9)    Obstructive sleep apnea    no longer needs CPAP after heart transplant per pt   Premature ventricular contractions    Prostate cancer (HCC)    SOB (shortness of breath)     Past Surgical History:  Procedure Laterality Date   A/V FISTULAGRAM Right 05/23/2023   Procedure: A/V Fistulagram;  Surgeon: Lanis Fonda BRAVO, MD;  Location: [facility] INVASIVE CV LAB;  Service: Cardiovascular;  Laterality: Right;   AV FISTULA PLACEMENT Right 07/04/2023   Procedure: ARTERIOVENOUS (AV) FISTULA  REVISION WITH APPLICATION OF ARTEGRAFT RIGHT ARM;  Surgeon: Sheree Penne Bruckner, MD;  Location: [facility] OR;  Service:  Vascular;  Laterality: Right;   CARDIAC PACEMAKER PLACEMENT  09/21/2009   Biventricular implantable cardioverter-defibrillator implantation      COLONOSCOPY W/ BIOPSIES AND POLYPECTOMY     CYSTOSCOPY WITH FULGERATION N/A 04/27/2021   Procedure: CYSTOSCOPY AND CLOT EVACUATION WITH BLADDER BIOPSY/ FUGARATION OF BLADDER/ FULGARATION OF PROSTATE ;  Surgeon: Nieves Cough, MD;  Location: [facility] ORS;  Service: Urology;  Laterality: N/A;   FOOT SURGERY     left   HEART TRANSPLANT  2014   INSERTION OF DIALYSIS CATHETER Right 07/04/2023   Procedure: INSERTION OF DIALYSIS CATHETER WITH 19 CM PALINDROME;  Surgeon: Sheree Penne Bruckner, MD;  Location: [facility] OR;  Service: Vascular;  Laterality: Right;   LAPAROSCOPIC GASTRIC BANDING  01/27/2007   LEFT VENTRICULAR ASSIST DEVICE     implanted at [facility]   PACEMAKER REMOVAL     PROSTATE BIOPSY     TOTAL HIP ARTHROPLASTY Right 07/22/2017   Procedure: RIGHT TOTAL HIP ARTHROPLASTY ANTERIOR APPROACH;  Surgeon: Fidel Rogue, MD;  Location: [facility] OR;  Service: Orthopedics;  Laterality: Right;  Needs RNFA   TOTAL KNEE ARTHROPLASTY Right 07/22/2017    ROS:   General:  No weight loss, Fever, chills  HEENT: No recent  headaches, no nasal bleeding, no visual changes, no sore throat  Neurologic: No dizziness, blackouts, seizures. No recent symptoms of stroke or mini- stroke. No recent episodes of slurred speech, or temporary blindness.  Cardiac: No recent episodes of chest pain/pressure, no shortness of breath at rest.  No shortness of breath with exertion.  Denies history of atrial fibrillation or irregular heartbeat  Vascular: No history of rest pain in feet.  No history of claudication.  No history of non-healing ulcer, No history of DVT   Pulmonary: No home oxygen, no productive cough, no hemoptysis,  No asthma or wheezing  Musculoskeletal:  [ ]  Arthritis, [ ]  Low back pain,  [ ]  Joint pain  Hematologic:No history of hypercoagulable state.  No history of easy  bleeding.  No history of anemia  Gastrointestinal: No hematochezia or melena,  No gastroesophageal reflux, no trouble swallowing  Urinary: [ ]  chronic Kidney disease, [ ]  on HD - [ ]  MWF or [ ]  TTHS, [ ]  Burning with urination, [ ]  Frequent urination, [ ]  Difficulty urinating;   Skin: No rashes  Psychological: No history of anxiety,  No history of depression  Social History Social History   Tobacco Use   Smoking status: Never   Smokeless tobacco: Former    Types: Snuff  Vaping Use   Vaping status: Never Used  Substance Use Topics   Alcohol  use: No   Drug use: No    Family History Family History  Problem Relation Age of Onset   Coronary artery disease Father        had, PTCA & CABG   Heart attack Father    Hypertension Father    Diabetes Father    Prostate cancer Father    Coronary artery disease Mother    Heart attack Mother    Hypertension Mother    Stroke Mother    Lupus Mother    Prostate cancer Brother    Coronary artery disease Brother    Coronary artery disease Sister    Heart attack Sister    Prostate cancer Paternal Uncle    Coronary artery disease Maternal Grandmother    Coronary artery disease Maternal Grandfather    Coronary artery disease Paternal Grandmother    Coronary artery disease Paternal Grandfather     Allergies  Allergies  Allergen Reactions   Heparin  Nausea Only, Swelling and Other (See Comments)    * * HIT * *  SWELLING REACTION UNSPECIFIED   DIAPHORESIS    Iodinated Contrast Media    Penicillin G Potassium [Penicillin G] Nausea Only and Other (See Comments)    DIAPHORESIS High Doses     Current Facility-Administered Medications  Medication Dose Route Frequency Provider Last Rate Last Admin   0.9 %  sodium chloride  infusion  250 mL Intravenous PRN Gretta Lonni PARAS, MD       acetaminophen  (TYLENOL ) tablet 1,000 mg  1,000 mg Oral Q8H PRN Franklyn Sid SAILOR, MD   1,000 mg at 09/06/23 9047   oxyCODONE  (Oxy IR/ROXICODONE )  immediate release tablet 5 mg  5 mg Oral Q4H PRN Franklyn Sid SAILOR, MD   5 mg at 09/06/23 0951   sodium chloride  flush (NS) 0.9 % injection 3 mL  3 mL Intravenous Q12H Gretta Lonni PARAS, MD       Current Outpatient Medications  Medication Sig Dispense Refill   acetaminophen  (TYLENOL ) 500 MG tablet Take 1,000 mg by mouth in the morning and at bedtime.     ARIPiprazole  (ABILIFY ) 10 MG  tablet Take 10 mg by mouth every morning.     cycloSPORINE  modified (NEORAL ) 100 MG capsule Take 100 mg by mouth See admin instructions. Take with 25 mg for a total of 125 mg twice daily     cycloSPORINE  modified 25 MG CAPS Take 25 mg by mouth See admin instructions. Take with 100 mg for a total of 125 mg twice daily     divalproex  (DEPAKOTE ) 125 MG DR tablet Take 250 mg by mouth in morning and 500 mg by mouth at night (Patient taking differently: Take 250-500 mg by mouth See admin instructions. Take 250 mg by mouth in morning and 500 mg by mouth at night) 180 tablet 11   DULoxetine  (CYMBALTA ) 60 MG capsule Take 1 capsule (60 mg total) by mouth daily. 90 capsule 3   ELIQUIS  2.5 MG TABS tablet Take 1 tablet (2.5 mg total) by mouth 2 (two) times daily. 60 tablet    hydrocortisone  (ANUSOL -HC) 2.5 % rectal cream Apply 1 Application topically as needed for hemorrhoids or anal itching.     hydrOXYzine  (ATARAX ) 25 MG tablet Take 25 mg by mouth 2 (two) times daily.     ibuprofen (ADVIL) 200 MG tablet Take 400 mg by mouth as needed for moderate pain (pain score 4-6) or mild pain (pain score 1-3).     midodrine  (PROAMATINE ) 10 MG tablet Take 1 tablet (10 mg total) by mouth 3 (three) times daily. (Patient taking differently: Take 10 mg by mouth every Monday, Wednesday, and Friday with hemodialysis. Take 10 mg twice a day on non-dialysis days)     mycophenolate  (MYFORTIC ) 180 MG EC tablet Take 720 mg by mouth 2 (two) times daily.     naloxone (NARCAN) nasal spray 4 mg/0.1 mL Place 0.4 mg into the nose once.     ondansetron   (ZOFRAN ) 4 MG tablet Take 1 tablet (4 mg total) by mouth every 6 (six) hours as needed for nausea. (Patient taking differently: Take 4 mg by mouth as needed for nausea.) 20 tablet 0   oxyCODONE -acetaminophen  (PERCOCET) 5-325 MG tablet Take 1 tablet by mouth every 6 (six) hours as needed for severe pain (pain score 7-10). Do not take with other opiate pain medication 20 tablet 0   pregabalin  (LYRICA ) 50 MG capsule Take 50 mg by mouth 2 (two) times daily.     rosuvastatin  (CRESTOR ) 10 MG tablet Take 1 tablet (10 mg total) by mouth daily. 30 tablet 1   sevelamer  carbonate (RENVELA ) 800 MG tablet Take 2 tablets (1,600 mg total) by mouth 3 (three) times daily with meals. (Patient taking differently: Take 2,400 mg by mouth 3 (three) times daily with meals. 800 mg with snacks) 120 tablet 0   sucroferric oxyhydroxide (VELPHORO) 500 MG chewable tablet Chew 500 mg by mouth in the morning and at bedtime.     traZODone  (DESYREL ) 100 MG tablet Take 100 mg by mouth at bedtime.      Physical Examination  Vitals:   09/06/23 0947  BP: 99/67  Pulse: 80  Resp: 15  Temp: 98.2 F (36.8 C)  TempSrc: Oral  SpO2: 99%    There is no height or weight on file to calculate BMI.  General:  Alert and oriented, no acute distress HEENT: Normal Neck: No bruit or JVD Pulmonary: Clear to auscultation bilaterally Cardiac: irregularly irregular without murmur Abdomen: Soft, non-tender, non-distended, no mass, no scars Skin: No rash     Extremity Pulses:  brisk multiphasic doppler signals B LE Musculoskeletal: B LE  edema   Neurologic: Upper and lower extremity motor grossly intact DATA:  ABI Findings:  +---------+------------------+-----+---------+----------------------+  Right   Rt Pressure (mmHg)IndexWaveform Comment                 +---------+------------------+-----+---------+----------------------+  Brachial 101                    biphasic HX of HD access in RUE   +---------+------------------+-----+---------+----------------------+  PTA                            triphasicnon compressible        +---------+------------------+-----+---------+----------------------+  DP                             triphasicnon compressible        +---------+------------------+-----+---------+----------------------+  Great Toe73                0.67 Normal                           +---------+------------------+-----+---------+----------------------+   +---------+------------------+-----+---------+-------+  Left    Lt Pressure (mmHg)IndexWaveform Comment  +---------+------------------+-----+---------+-------+  Brachial 109                    triphasic         +---------+------------------+-----+---------+-------+  PTA                            triphasic         +---------+------------------+-----+---------+-------+  DP                             triphasic         +---------+------------------+-----+---------+-------+  Great Toe75                0.69 Normal            +---------+------------------+-----+---------+-------+     Arterial wall calcification precludes accurate ankle pressures and ABIs.    Summary:  Right: Resting right ankle-brachial index indicates noncompressible right  lower extremity arteries. The right toe-brachial index is mildly reduced.   Left: Resting left ankle-brachial index indicates noncompressible left  lower extremity arteries. The left toe-brachial index is mildly reduced.     ASSESSMENT/PLAN:  Triphasic wave forms with mild TBI decrease He has diabetic small vessel disease with ischemic toes. He is at high risk of limb loss due to DM.  He has calcifies vessels with good multiphasic inflow.  If he requires surgical TMA and he has healing issues we could follow up with angiogram with possible intervention.  Currently his arterial flow is stable.  Call [facility] with concerns No emergent or  urgent interventions is needed.       Maurilio Deland Collet PA-C [facility] Office: [phone]  I have independently interviewed and examined patient and agree with PA assessment and plan above.  I was able to palpate bilateral dorsalis pedis pulses through significant thick edema.  I discussed with the patient's family at bedside difficulty of the situation and patient with macrovascular flow that is multiphasic all the way to the level of the midfoot with toe gangrene suggesting significant small vessel disease.  I have discussed the high risk nature of amputation of his toes that would be determined  by podiatry but that any nonhealing would require more proximal amputation from which she would certainly not be able to recover the ability to walk.  They demonstrate good understanding of this.  He does not need admission to the hospital from a vascular surgery standpoint as no procedures are planned.  Ultimately when he recovers he will need consideration of right upper extremity fistula versus graft but would wait until all infectious issues have resolved.  Giavonni Cizek C. Sheree, MD [facility] [place] Office: [phone] Pager: [phone]

## 2023-09-06 NOTE — Progress Notes (Signed)
 ABI has been completed.   Results can be found under chart review under CV PROC. 09/06/2023 1:38 PM Britain Saber RVT, RDMS

## 2023-09-06 NOTE — ED Notes (Signed)
 Admitting at bedside

## 2023-09-07 DIAGNOSIS — R4182 Altered mental status, unspecified: Secondary | ICD-10-CM | POA: Diagnosis not present

## 2023-09-07 DIAGNOSIS — Z7401 Bed confinement status: Secondary | ICD-10-CM | POA: Diagnosis not present

## 2023-09-07 DIAGNOSIS — R609 Edema, unspecified: Secondary | ICD-10-CM | POA: Diagnosis not present

## 2023-09-07 DIAGNOSIS — R600 Localized edema: Secondary | ICD-10-CM | POA: Diagnosis not present

## 2023-09-07 NOTE — ED Notes (Signed)
 Called spouse, Dedra Skeens, [phone], for update. NA. Called daughter, Rolly Salter, [phone], NA.

## 2023-09-07 NOTE — ED Provider Notes (Signed)
 I assumed care of patient after he returned from dialysis. Patient with extensive ED stay requiring nephrology and vascular consults.  Plan per notes is after dialysis complete, patient safe for discharge and can follow-up as an outpatient.  Patient is awake alert no acute distress and requesting discharge home.  His vital signs are appropriate. Patient is safe for discharge home   Midge Golas, MD 09/07/23 0105

## 2023-09-07 NOTE — ED Notes (Signed)
 Voicemails left for spouse Dedra Skeens) and daughter Rolly Salter) to discuss pt's discharge.

## 2023-09-07 NOTE — ED Notes (Signed)
Dr. Wickline at bedside.  

## 2023-09-07 NOTE — ED Notes (Signed)
 Left second voicemails for daughter and spouse about transportation. Pt states they are at home.

## 2023-09-09 DIAGNOSIS — E1129 Type 2 diabetes mellitus with other diabetic kidney complication: Secondary | ICD-10-CM | POA: Diagnosis not present

## 2023-09-09 DIAGNOSIS — D509 Iron deficiency anemia, unspecified: Secondary | ICD-10-CM | POA: Diagnosis not present

## 2023-09-09 DIAGNOSIS — L819 Disorder of pigmentation, unspecified: Secondary | ICD-10-CM | POA: Diagnosis not present

## 2023-09-09 DIAGNOSIS — I96 Gangrene, not elsewhere classified: Secondary | ICD-10-CM | POA: Diagnosis not present

## 2023-09-09 DIAGNOSIS — N186 End stage renal disease: Secondary | ICD-10-CM | POA: Diagnosis not present

## 2023-09-09 DIAGNOSIS — D631 Anemia in chronic kidney disease: Secondary | ICD-10-CM | POA: Diagnosis not present

## 2023-09-09 DIAGNOSIS — N2581 Secondary hyperparathyroidism of renal origin: Secondary | ICD-10-CM | POA: Diagnosis not present

## 2023-09-09 DIAGNOSIS — Z992 Dependence on renal dialysis: Secondary | ICD-10-CM | POA: Diagnosis not present

## 2023-09-10 LAB — HEPATITIS B SURFACE ANTIBODY, QUANTITATIVE: Hep B S AB Quant (Post): 65.3 m[IU]/mL

## 2023-09-11 DIAGNOSIS — N186 End stage renal disease: Secondary | ICD-10-CM | POA: Diagnosis not present

## 2023-09-11 DIAGNOSIS — D509 Iron deficiency anemia, unspecified: Secondary | ICD-10-CM | POA: Diagnosis not present

## 2023-09-11 DIAGNOSIS — I96 Gangrene, not elsewhere classified: Secondary | ICD-10-CM | POA: Diagnosis not present

## 2023-09-11 DIAGNOSIS — E1122 Type 2 diabetes mellitus with diabetic chronic kidney disease: Secondary | ICD-10-CM | POA: Diagnosis not present

## 2023-09-11 DIAGNOSIS — N2581 Secondary hyperparathyroidism of renal origin: Secondary | ICD-10-CM | POA: Diagnosis not present

## 2023-09-11 DIAGNOSIS — Z992 Dependence on renal dialysis: Secondary | ICD-10-CM | POA: Diagnosis not present

## 2023-09-11 DIAGNOSIS — I420 Dilated cardiomyopathy: Secondary | ICD-10-CM | POA: Diagnosis not present

## 2023-09-11 DIAGNOSIS — D631 Anemia in chronic kidney disease: Secondary | ICD-10-CM | POA: Diagnosis not present

## 2023-09-11 DIAGNOSIS — I509 Heart failure, unspecified: Secondary | ICD-10-CM | POA: Diagnosis not present

## 2023-09-11 DIAGNOSIS — I132 Hypertensive heart and chronic kidney disease with heart failure and with stage 5 chronic kidney disease, or end stage renal disease: Secondary | ICD-10-CM | POA: Diagnosis not present

## 2023-09-12 ENCOUNTER — Emergency Department (HOSPITAL_COMMUNITY): Payer: Medicare Other

## 2023-09-12 ENCOUNTER — Inpatient Hospital Stay (HOSPITAL_COMMUNITY)
Admission: EM | Admit: 2023-09-12 | Discharge: 2023-09-16 | DRG: 299 | Disposition: A | Discharge status: Home / Self Care | Payer: Medicare Other | Source: Ambulatory Visit | Attending: Infectious Diseases | Admitting: Infectious Diseases

## 2023-09-12 ENCOUNTER — Encounter (HOSPITAL_COMMUNITY): Payer: Self-pay

## 2023-09-12 DIAGNOSIS — D631 Anemia in chronic kidney disease: Secondary | ICD-10-CM | POA: Diagnosis present

## 2023-09-12 DIAGNOSIS — Z8042 Family history of malignant neoplasm of prostate: Secondary | ICD-10-CM

## 2023-09-12 DIAGNOSIS — E11628 Type 2 diabetes mellitus with other skin complications: Secondary | ICD-10-CM | POA: Diagnosis not present

## 2023-09-12 DIAGNOSIS — E1169 Type 2 diabetes mellitus with other specified complication: Secondary | ICD-10-CM | POA: Diagnosis present

## 2023-09-12 DIAGNOSIS — I70262 Atherosclerosis of native arteries of extremities with gangrene, left leg: Secondary | ICD-10-CM | POA: Diagnosis present

## 2023-09-12 DIAGNOSIS — F32A Depression, unspecified: Secondary | ICD-10-CM | POA: Diagnosis not present

## 2023-09-12 DIAGNOSIS — Z950 Presence of cardiac pacemaker: Secondary | ICD-10-CM

## 2023-09-12 DIAGNOSIS — I959 Hypotension, unspecified: Secondary | ICD-10-CM | POA: Diagnosis not present

## 2023-09-12 DIAGNOSIS — Z96659 Presence of unspecified artificial knee joint: Secondary | ICD-10-CM | POA: Diagnosis present

## 2023-09-12 DIAGNOSIS — Z941 Heart transplant status: Secondary | ICD-10-CM | POA: Diagnosis not present

## 2023-09-12 DIAGNOSIS — Z8249 Family history of ischemic heart disease and other diseases of the circulatory system: Secondary | ICD-10-CM

## 2023-09-12 DIAGNOSIS — M898X9 Other specified disorders of bone, unspecified site: Secondary | ICD-10-CM | POA: Diagnosis present

## 2023-09-12 DIAGNOSIS — D84821 Immunodeficiency due to drugs: Secondary | ICD-10-CM | POA: Diagnosis present

## 2023-09-12 DIAGNOSIS — Z6830 Body mass index (BMI) 30.0-30.9, adult: Secondary | ICD-10-CM | POA: Diagnosis not present

## 2023-09-12 DIAGNOSIS — Z8673 Personal history of transient ischemic attack (TIA), and cerebral infarction without residual deficits: Secondary | ICD-10-CM

## 2023-09-12 DIAGNOSIS — L97529 Non-pressure chronic ulcer of other part of left foot with unspecified severity: Secondary | ICD-10-CM | POA: Diagnosis present

## 2023-09-12 DIAGNOSIS — E1122 Type 2 diabetes mellitus with diabetic chronic kidney disease: Secondary | ICD-10-CM | POA: Diagnosis present

## 2023-09-12 DIAGNOSIS — Z7901 Long term (current) use of anticoagulants: Secondary | ICD-10-CM | POA: Diagnosis not present

## 2023-09-12 DIAGNOSIS — Z992 Dependence on renal dialysis: Secondary | ICD-10-CM

## 2023-09-12 DIAGNOSIS — E11621 Type 2 diabetes mellitus with foot ulcer: Secondary | ICD-10-CM | POA: Diagnosis present

## 2023-09-12 DIAGNOSIS — N186 End stage renal disease: Secondary | ICD-10-CM | POA: Diagnosis not present

## 2023-09-12 DIAGNOSIS — N2581 Secondary hyperparathyroidism of renal origin: Secondary | ICD-10-CM | POA: Diagnosis present

## 2023-09-12 DIAGNOSIS — Z79899 Other long term (current) drug therapy: Secondary | ICD-10-CM | POA: Diagnosis not present

## 2023-09-12 DIAGNOSIS — I428 Other cardiomyopathies: Secondary | ICD-10-CM | POA: Diagnosis present

## 2023-09-12 DIAGNOSIS — Z96651 Presence of right artificial knee joint: Secondary | ICD-10-CM | POA: Diagnosis present

## 2023-09-12 DIAGNOSIS — F0390 Unspecified dementia without behavioral disturbance: Secondary | ICD-10-CM | POA: Diagnosis present

## 2023-09-12 DIAGNOSIS — I150 Renovascular hypertension: Secondary | ICD-10-CM | POA: Diagnosis present

## 2023-09-12 DIAGNOSIS — L97523 Non-pressure chronic ulcer of other part of left foot with necrosis of muscle: Secondary | ICD-10-CM

## 2023-09-12 DIAGNOSIS — E1152 Type 2 diabetes mellitus with diabetic peripheral angiopathy with gangrene: Secondary | ICD-10-CM | POA: Diagnosis present

## 2023-09-12 DIAGNOSIS — I4891 Unspecified atrial fibrillation: Secondary | ICD-10-CM | POA: Diagnosis present

## 2023-09-12 DIAGNOSIS — E1142 Type 2 diabetes mellitus with diabetic polyneuropathy: Secondary | ICD-10-CM | POA: Diagnosis present

## 2023-09-12 DIAGNOSIS — E669 Obesity, unspecified: Secondary | ICD-10-CM | POA: Diagnosis present

## 2023-09-12 DIAGNOSIS — I9589 Other hypotension: Secondary | ICD-10-CM | POA: Diagnosis present

## 2023-09-12 DIAGNOSIS — R600 Localized edema: Secondary | ICD-10-CM | POA: Diagnosis not present

## 2023-09-12 DIAGNOSIS — Z823 Family history of stroke: Secondary | ICD-10-CM

## 2023-09-12 DIAGNOSIS — Z9884 Bariatric surgery status: Secondary | ICD-10-CM

## 2023-09-12 DIAGNOSIS — Z8269 Family history of other diseases of the musculoskeletal system and connective tissue: Secondary | ICD-10-CM

## 2023-09-12 DIAGNOSIS — Z91041 Radiographic dye allergy status: Secondary | ICD-10-CM

## 2023-09-12 DIAGNOSIS — Z7960 Long term (current) use of unspecified immunomodulators and immunosuppressants: Secondary | ICD-10-CM

## 2023-09-12 DIAGNOSIS — M1990 Unspecified osteoarthritis, unspecified site: Secondary | ICD-10-CM | POA: Diagnosis present

## 2023-09-12 DIAGNOSIS — F419 Anxiety disorder, unspecified: Secondary | ICD-10-CM | POA: Diagnosis present

## 2023-09-12 DIAGNOSIS — L089 Local infection of the skin and subcutaneous tissue, unspecified: Secondary | ICD-10-CM | POA: Diagnosis not present

## 2023-09-12 DIAGNOSIS — E785 Hyperlipidemia, unspecified: Secondary | ICD-10-CM | POA: Diagnosis present

## 2023-09-12 DIAGNOSIS — Z833 Family history of diabetes mellitus: Secondary | ICD-10-CM

## 2023-09-12 DIAGNOSIS — I252 Old myocardial infarction: Secondary | ICD-10-CM

## 2023-09-12 DIAGNOSIS — I159 Secondary hypertension, unspecified: Secondary | ICD-10-CM | POA: Diagnosis not present

## 2023-09-12 DIAGNOSIS — Z888 Allergy status to other drugs, medicaments and biological substances status: Secondary | ICD-10-CM

## 2023-09-12 DIAGNOSIS — M7989 Other specified soft tissue disorders: Secondary | ICD-10-CM | POA: Diagnosis not present

## 2023-09-12 DIAGNOSIS — I96 Gangrene, not elsewhere classified: Principal | ICD-10-CM | POA: Diagnosis present

## 2023-09-12 DIAGNOSIS — Z880 Allergy status to penicillin: Secondary | ICD-10-CM

## 2023-09-12 DIAGNOSIS — N250 Renal osteodystrophy: Secondary | ICD-10-CM | POA: Diagnosis not present

## 2023-09-12 DIAGNOSIS — Z8546 Personal history of malignant neoplasm of prostate: Secondary | ICD-10-CM

## 2023-09-12 DIAGNOSIS — K649 Unspecified hemorrhoids: Secondary | ICD-10-CM | POA: Diagnosis present

## 2023-09-12 DIAGNOSIS — Z96641 Presence of right artificial hip joint: Secondary | ICD-10-CM | POA: Diagnosis present

## 2023-09-12 LAB — COMPREHENSIVE METABOLIC PANEL
ALT: 8 U/L (ref 0–44)
AST: 10 U/L — ABNORMAL LOW (ref 15–41)
Albumin: 3.1 g/dL — ABNORMAL LOW (ref 3.5–5.0)
Alkaline Phosphatase: 88 U/L (ref 38–126)
Anion gap: 16 — ABNORMAL HIGH (ref 5–15)
BUN: 27 mg/dL — ABNORMAL HIGH (ref 8–23)
CO2: 24 mmol/L (ref 22–32)
Calcium: 9.8 mg/dL (ref 8.9–10.3)
Chloride: 95 mmol/L — ABNORMAL LOW (ref 98–111)
Creatinine, Ser: 7.33 mg/dL — ABNORMAL HIGH (ref 0.61–1.24)
GFR, Estimated: 8 mL/min — ABNORMAL LOW (ref >60)
Glucose, Bld: 111 mg/dL — ABNORMAL HIGH (ref 70–99)
Potassium: 4.1 mmol/L (ref 3.5–5.1)
Sodium: 135 mmol/L (ref 135–145)
Total Bilirubin: 1 mg/dL (ref 0.0–1.2)
Total Protein: 6.6 g/dL (ref 6.5–8.1)

## 2023-09-12 LAB — CBC WITH DIFFERENTIAL/PLATELET
Abs Immature Granulocytes: 0.04 10*3/uL (ref 0.00–0.07)
Basophils Absolute: 0.1 10*3/uL (ref 0.0–0.1)
Basophils Relative: 1 %
Eosinophils Absolute: 0.1 10*3/uL (ref 0.0–0.5)
Eosinophils Relative: 1 %
HCT: 37.5 % — ABNORMAL LOW (ref 39.0–52.0)
Hemoglobin: 11.3 g/dL — ABNORMAL LOW (ref 13.0–17.0)
Immature Granulocytes: 1 %
Lymphocytes Relative: 16 %
Lymphs Abs: 1.2 10*3/uL (ref 0.7–4.0)
MCH: 30.1 pg (ref 26.0–34.0)
MCHC: 30.1 g/dL (ref 30.0–36.0)
MCV: 99.7 fL (ref 80.0–100.0)
Monocytes Absolute: 0.7 10*3/uL (ref 0.1–1.0)
Monocytes Relative: 10 %
Neutro Abs: 5 10*3/uL (ref 1.7–7.7)
Neutrophils Relative %: 71 %
Platelets: 217 10*3/uL (ref 150–400)
RBC: 3.76 MIL/uL — ABNORMAL LOW (ref 4.22–5.81)
RDW: 16.4 % — ABNORMAL HIGH (ref 11.5–15.5)
WBC: 7.1 10*3/uL (ref 4.0–10.5)
nRBC: 0 % (ref 0.0–0.2)

## 2023-09-12 LAB — I-STAT CG4 LACTIC ACID, ED: Lactic Acid, Venous: 1.9 mmol/L (ref 0.5–1.9)

## 2023-09-12 LAB — CBG MONITORING, ED: Glucose-Capillary: 111 mg/dL — ABNORMAL HIGH (ref 70–99)

## 2023-09-12 MED ORDER — VANCOMYCIN VARIABLE DOSE PER UNSTABLE RENAL FUNCTION (PHARMACIST DOSING): Status: DC

## 2023-09-12 MED ORDER — ARIPIPRAZOLE 5 MG PO TABS
10.0000 mg | ORAL_TABLET | Freq: Every morning | ORAL | Status: DC
  Administered 2023-09-13 – 2023-09-16 (×4): 10 mg via ORAL
  Filled 2023-09-12 (×3): qty 2
  Filled 2023-09-12: qty 1

## 2023-09-12 MED ORDER — CYCLOSPORINE MODIFIED (NEORAL) 25 MG PO CAPS
125.0000 mg | ORAL_CAPSULE | Freq: Two times a day (BID) | ORAL | Status: DC
  Filled 2023-09-12: qty 5

## 2023-09-12 MED ORDER — DIVALPROEX SODIUM 500 MG PO DR TAB
500.0000 mg | DELAYED_RELEASE_TABLET | Freq: Every day | ORAL | Status: DC
  Administered 2023-09-12 – 2023-09-15 (×4): 500 mg via ORAL
  Filled 2023-09-12 (×3): qty 1
  Filled 2023-09-12: qty 2

## 2023-09-12 MED ORDER — CYCLOSPORINE MODIFIED (NEORAL) 25 MG PO CAPS
25.0000 mg | ORAL_CAPSULE | Freq: Two times a day (BID) | ORAL | Status: DC
  Filled 2023-09-12: qty 1

## 2023-09-12 MED ORDER — DIVALPROEX SODIUM 250 MG PO DR TAB
250.0000 mg | DELAYED_RELEASE_TABLET | Freq: Every morning | ORAL | Status: DC
  Administered 2023-09-13 – 2023-09-16 (×4): 250 mg via ORAL
  Filled 2023-09-12 (×4): qty 1

## 2023-09-12 MED ORDER — CYCLOSPORINE MODIFIED (GENGRAF) 100 MG PO CAPS
100.0000 mg | ORAL_CAPSULE | Freq: Two times a day (BID) | ORAL | Status: DC
Start: 2023-09-13 — End: 2023-09-16
  Administered 2023-09-13 – 2023-09-16 (×8): 100 mg via ORAL
  Filled 2023-09-12 (×9): qty 1

## 2023-09-12 MED ORDER — DULOXETINE HCL 60 MG PO CPEP
60.0000 mg | ORAL_CAPSULE | Freq: Every day | ORAL | Status: DC
Start: 2023-09-13 — End: 2023-09-16
  Administered 2023-09-13 – 2023-09-16 (×4): 60 mg via ORAL
  Filled 2023-09-12: qty 2
  Filled 2023-09-12 (×3): qty 1

## 2023-09-12 MED ORDER — VANCOMYCIN HCL 10 G IV SOLR
2500.0000 mg | Freq: Once | INTRAVENOUS | Status: AC
  Administered 2023-09-13: 2500 mg via INTRAVENOUS
  Filled 2023-09-12: qty 25
  Filled 2023-09-12: qty 50

## 2023-09-12 MED ORDER — HYDROXYZINE HCL 25 MG PO TABS
25.0000 mg | ORAL_TABLET | Freq: Two times a day (BID) | ORAL | Status: DC
  Administered 2023-09-12 – 2023-09-15 (×6): 25 mg via ORAL
  Filled 2023-09-12 (×6): qty 1

## 2023-09-12 MED ORDER — TRAZODONE HCL 50 MG PO TABS
100.0000 mg | ORAL_TABLET | Freq: Every day | ORAL | Status: DC
  Administered 2023-09-12 – 2023-09-15 (×3): 100 mg via ORAL
  Filled 2023-09-12 (×4): qty 2

## 2023-09-12 MED ORDER — MYCOPHENOLATE SODIUM 180 MG PO TBEC
720.0000 mg | DELAYED_RELEASE_TABLET | Freq: Two times a day (BID) | ORAL | Status: DC
  Administered 2023-09-12 – 2023-09-16 (×8): 720 mg via ORAL
  Filled 2023-09-12 (×9): qty 4

## 2023-09-12 MED ORDER — CYCLOSPORINE MODIFIED (NEORAL) 100 MG PO CAPS: 125.0000 mg | ORAL_CAPSULE | Freq: Two times a day (BID) | ORAL | Status: DC

## 2023-09-12 MED ORDER — CEFTRIAXONE SODIUM 1 G IJ SOLR
1.0000 g | Freq: Once | INTRAMUSCULAR | Status: AC
  Administered 2023-09-12: 1 g via INTRAVENOUS
  Filled 2023-09-12: qty 10

## 2023-09-12 MED ORDER — PIPERACILLIN-TAZOBACTAM IN DEX 2-0.25 GM/50ML IV SOLN
2.2500 g | Freq: Three times a day (TID) | INTRAVENOUS | Status: DC
  Administered 2023-09-13 – 2023-09-16 (×10): 2.25 g via INTRAVENOUS
  Filled 2023-09-12 (×13): qty 50

## 2023-09-12 MED ORDER — APIXABAN 2.5 MG PO TABS
2.5000 mg | ORAL_TABLET | Freq: Two times a day (BID) | ORAL | Status: DC
  Administered 2023-09-13: 2.5 mg via ORAL
  Filled 2023-09-12: qty 1

## 2023-09-12 MED ORDER — ROSUVASTATIN CALCIUM 5 MG PO TABS
10.0000 mg | ORAL_TABLET | Freq: Every day | ORAL | Status: DC
  Administered 2023-09-13: 10 mg via ORAL
  Filled 2023-09-12 (×2): qty 2

## 2023-09-12 MED ORDER — INSULIN ASPART 100 UNIT/ML IJ SOLN: 0.0000 [IU] | INTRAMUSCULAR | Status: DC

## 2023-09-12 MED ORDER — HYDROCORTISONE (PERIANAL) 2.5 % EX CREA: 1.0000 | TOPICAL_CREAM | CUTANEOUS | Status: DC | PRN

## 2023-09-12 MED ORDER — VANCOMYCIN HCL IN DEXTROSE 1-5 GM/200ML-% IV SOLN: 1000.0000 mg | Freq: Once | INTRAVENOUS | Status: DC

## 2023-09-12 MED ORDER — MIDODRINE HCL 5 MG PO TABS
10.0000 mg | ORAL_TABLET | Freq: Three times a day (TID) | ORAL | Status: DC
  Administered 2023-09-13 – 2023-09-16 (×11): 10 mg via ORAL
  Filled 2023-09-12 (×11): qty 2

## 2023-09-12 MED ORDER — SEVELAMER CARBONATE 800 MG PO TABS
2400.0000 mg | ORAL_TABLET | Freq: Three times a day (TID) | ORAL | Status: DC
  Administered 2023-09-13 – 2023-09-16 (×10): 2400 mg via ORAL
  Filled 2023-09-12 (×11): qty 3

## 2023-09-12 MED ORDER — CYCLOSPORINE MODIFIED (GENGRAF) 25 MG PO CAPS
25.0000 mg | ORAL_CAPSULE | Freq: Two times a day (BID) | ORAL | Status: DC
Start: 2023-09-13 — End: 2023-09-16
  Administered 2023-09-13 – 2023-09-16 (×8): 25 mg via ORAL
  Filled 2023-09-12 (×9): qty 1

## 2023-09-12 MED ORDER — PREGABALIN 25 MG PO CAPS
50.0000 mg | ORAL_CAPSULE | Freq: Two times a day (BID) | ORAL | Status: DC
  Administered 2023-09-12 – 2023-09-16 (×8): 50 mg via ORAL
  Filled 2023-09-12 (×8): qty 2

## 2023-09-12 MED ORDER — CYCLOSPORINE MODIFIED (NEORAL) 100 MG PO CAPS: 100.0000 mg | ORAL_CAPSULE | Freq: Two times a day (BID) | ORAL | Status: DC

## 2023-09-12 NOTE — ED Triage Notes (Signed)
Patient refused to have the MRI of the right foot. Was only able to get the MRI of the left foot.

## 2023-09-12 NOTE — H&P (Signed)
Date: 09/12/2023         Patient Name:  [name] MRN: [id number]  DOB: [birth year] Age / Sex: 63 y.o., male   PCP: Pcp, No         Medical Service: Internal Medicine Teaching Service         Attending Physician: Dr. Ginnie Smart, MD    First Contact: Dr. Carmina Miller, DO Pager [phone] Pager: [phone]  Second Contact: Dr. Gwenevere Abbot, MD Pager [phone] Pager: [phone]       After Hours (After 5p/  First Contact Pager: [phone]  weekends / holidays): Second Contact Pager: [phone]   Chief Concern: Worsening discoloration of the left toe    History of Present Illness: [name] is 63 year old male with history of atrial fibrillation on Eliquis, CVA, hyperlipidemia and hypertension who presented to the ED from his PCP office due to concerns for worsening foot gangrene.  Patient was last seen on 09/06/2023 for foot discoloration and ABI at the time was reassuring. Vascular surgery evaluated the patient didn't think there was a need for immediate inventions at that time.Plan was to follow up with podiatry in 3 weeks. The patient reports  his left toe gangrene was getting worse and a bit painful so he went to his PCP and he was sent here for osteomyelitis rule out.  Patient report complete adherence to his dialysis regimen as well as his immunosuppressants and other medications.  He denies any trauma to the foot. He endorses fever,chills but has not had any cough, chest pain or shortness of breath during this time.  In the ED, MRI foots was ordered and Vascular surgery consulted  who agreed to see the patient tomorrow. IMTS was then consulted  to admit.   Review of Systems  Constitutional:  Positive for chills, fever and malaise/fatigue.  Respiratory:  Negative for cough.   Cardiovascular:  Negative for chest pain, palpitations, claudication and leg swelling.  Musculoskeletal:  Negative for myalgias.    Allergies: Heparin - nausea Penicillin - nausea  Past Medical  History: Anxiety Atrial fibrillation Arthritis CVA Dementia Depression Diabetes melitis Hyperlipidemia Hypertension Morbid obesity Prostate cancer Gangrene of the foot  Medications:  Crestor 10 mg daily Pregabalin 50 mg twice daily Trazodone 100 mg at bedtime Velphoro 500 mg in the morning and at bedtime Renvela 800 mg Percocet every 6 hours Eliquis Mycophenolate Cyclosporine Hydroxyzine Duloxetine Abilify Hydrocortisone rectal cream   Surgical History: Laparoscopic gastric banding 2008 Cardiac pacemaker placement 2011 Heart transplant 2014 Total hip and knee arthroplasty 2018 Cystoscopy with fulgeration 2022 AV fistula placement 2024  Family History:  Heart attack, hypertension, diabetes, prostate cancer in the father Coronary artery disease,heart attack, hypertension, stroke and lupus in the mother Coronary artery disease in the brother Coronary artery disease in the sister  Social History:  Patient lives at home with his wife and daughter.  Patient is immobilized and is dependent on all of his ADLs and IADLs.  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  Denies any use of alcohol or illicit drugs.  Used to smoke unspecified amount of cigarettes but has not had anything to smoke in the past 2 years.  Did not elect his wife and daughter asked designated decision makers.  CODE STATUS remain full code at this time  Physical Exam: Blood pressure 103/68, pulse 87, temperature 98.2 F (36.8 C), resp. rate 18, SpO2  100%.   Constitutional: Ill appearing man ,laying in bed, in no acute distress HENT: normocephalic atraumatic, mucous membranes moist Cardiovascular: RRR Pulmonary/Chest: normal work of breathing on room air, lungs clear to auscultation bilaterally.  Abdominal: soft, non-tender, non-distended.  Neurological: alert & oriented x 3 MSK:  2+ pitting edema LE bilaterally. Gangrenous changes noted  on left toes with open blisters. Left toe feels warm to touch with surrounding erythema.decreased PT and DP pulses  on the left foot compared right. Temporary Central dialysis cath in place on right chest, looks c/d/I     Skin: warm and dry Psych: Normal mood and affect   Labs:    Latest Ref Rng & Units 09/12/2023    5:00 PM 09/06/2023    8:53 AM 08/11/2023    6:29 PM  CBC  WBC 4.0 - 10.5 K/uL 7.1  6.5  5.4   Hemoglobin 13.0 - 17.0 g/dL 16.1  09.6  04.5   Hematocrit 39.0 - 52.0 % 37.5  33.4  34.2   Platelets 150 - 400 K/uL 217  197  203        Latest Ref Rng & Units 09/12/2023    5:00 PM 09/06/2023    8:53 AM 08/11/2023    6:29 PM  CMP  Glucose 70 - 99 mg/dL 409  78  811   BUN 8 - 23 mg/dL 27  28  29    Creatinine 0.61 - 1.24 mg/dL 9.14  7.82  9.56   Sodium 135 - 145 mmol/L 135  136  136   Potassium 3.5 - 5.1 mmol/L 4.1  4.2  3.9   Chloride 98 - 111 mmol/L 95  95  95   CO2 22 - 32 mmol/L 24  26  27    Calcium 8.9 - 10.3 mg/dL 9.8  9.5  9.5   Total Protein 6.5 - 8.1 g/dL 6.6  6.0    Total Bilirubin 0.0 - 1.2 mg/dL 1.0  0.9    Alkaline Phos 38 - 126 U/L 88  78    AST 15 - 41 U/L 10  9    ALT 0 - 44 U/L 8  6       Images and other studies:  Imaging: MRI Pending    Assessment & Plan:  [name] is a 63 y.o. male with history of heart transplant, end-stage renal disease, A-fib on Eliquis, CVA and hypertension who presented with worsening gangrene of the left foot and admitted for osteomyelitis rule out.  Principal Problem:   Gangrene of foot (HCC)   #Gangrene of left foot  Presented with worsening gangrene of the left foot compared to a week ago.  WBC is 7.1,afebrile and lactate of 1.9 .  On exam to left toe swelling with diminished DP pulses and has surrounding erythema concerning for infection.I worry that this infection might have gotten to the bone and agrees with the foot MRI for Osteomyelitis rule out.  ABIs a week ago did not show any evidence of major vessel  occlusions. Vascular surgery consulted, will see patient in the morning. Will initiate antibiotics to presumably treat for osteomyelitis/soft tissue infections at this time. - Start Vancomycin and Zosyn - Follow up with Vascular  surgery - Follow-up on MRI  read - ABIs - NPO at Midnight  #ESRD on HD  - Consult nephrology for in patient dialysis  - Resume Renvela  #History of CVA -Continue home Crestor 10 mg  #History of anxiety and depression  - Continue on  home hydroxyzine, Abilify and Depakote   #History of A-fib -Continue home Eliquis 2.5  #Hx of Heart transplant  - Resume immunosuppressants, Cyclosporine and mycophenolate   #Hx of recurrent hemorrhoids  - Resume home rectal hydrocortisone cream  #Hx of peripheral neuropathy - Resume home Pregabalin   Level of care: Med surge  Diet: NPO at midnight  IVF: N/A VTE: apixaban (ELIQUIS) tablet 2.5 mg Start: 09/12/23 2315 Code: FULL  Surrogate: Wife and Daughter   Signed: Kathleen Lime, MD 09/12/2023, 11:37 PM

## 2023-09-12 NOTE — ED Provider Notes (Signed)
[place] EMERGENCY DEPARTMENT AT [facility] Provider Note   CSN: [id number] Arrival date & time: 09/12/23  1558     History  Chief Complaint  Patient presents with   Recurrent Skin Infections    [name] is a 63 y.o. male.  63 year old male with a history of dementia, stroke, CHF status post heart transplant, atrial fibrillation, diabetes, hypertension, and hyperlipidemia who presents emergency department with discoloration of his toes on the left side.  Patient started having discoloration of one of his toes 6 days ago on his left foot.  Since then has spread to all 5 toes.  Because of his dementia and neuropathy they are unsure how much pain he is having.  No fevers at all.  Saw his primary doctor who referred him in for dry gangrene evaluation.       Home Medications Prior to Admission medications   Medication Sig Start Date End Date Taking? Authorizing Provider  acetaminophen (TYLENOL) 500 MG tablet Take 1,000 mg by mouth 2 (two) times daily as needed for moderate pain (pain score 4-6).   Yes [provider]  ARIPiprazole (ABILIFY) 10 MG tablet Take 10 mg by mouth every morning.   Yes [provider]  cycloSPORINE modified (NEORAL) 100 MG capsule Take 100 mg by mouth See admin instructions. Take with 25 mg for a total of 125 mg twice daily   Yes [provider]  cycloSPORINE modified 25 MG CAPS Take 25 mg by mouth See admin instructions. Take with 100 mg for a total of 125 mg twice daily 05/09/23  Yes [provider]  divalproex (DEPAKOTE) 125 MG DR tablet Take 250 mg by mouth in morning and 500 mg by mouth at night Patient taking differently: Take 250-500 mg by mouth See admin instructions. Take 250 mg by mouth in morning and 500 mg by mouth at night 12/17/22  Yes Levert Feinstein, MD  DULoxetine (CYMBALTA) 60 MG capsule Take 1 capsule (60 mg total) by mouth daily. 11/01/22  Yes Levert Feinstein, MD  ELIQUIS 2.5 MG TABS tablet Take 1 tablet  (2.5 mg total) by mouth 2 (two) times daily. 04/30/21  Yes Jerilee Field, MD  hydrocortisone (ANUSOL-HC) 2.5 % rectal cream Apply 1 Application topically as needed for hemorrhoids or anal itching. 11/01/22  Yes [provider]  hydrOXYzine (ATARAX) 25 MG tablet Take 25 mg by mouth 2 (two) times daily.   Yes [provider]  ibuprofen (ADVIL) 200 MG tablet Take 400 mg by mouth as needed for moderate pain (pain score 4-6) or mild pain (pain score 1-3).   Yes [provider]  midodrine (PROAMATINE) 10 MG tablet Take 1 tablet (10 mg total) by mouth 3 (three) times daily. Patient taking differently: Take 10 mg by mouth every Monday, Wednesday, and Friday with hemodialysis. Take 10 mg twice a day on non-dialysis days 04/27/21  Yes Jerilee Field, MD  mycophenolate (MYFORTIC) 180 MG EC tablet Take 720 mg by mouth 2 (two) times daily.   Yes [provider]  naloxone (NARCAN) nasal spray 4 mg/0.1 mL Place 0.4 mg into the nose once as needed (opioid overdose).   Yes [provider]  oxyCODONE-acetaminophen (PERCOCET) 5-325 MG tablet Take 1 tablet by mouth every 6 (six) hours as needed for severe pain (pain score 7-10). Do not take with other opiate pain medication 07/04/23 07/03/24 Yes Baglia, Corrina, PA-C  pregabalin (LYRICA) 50 MG capsule Take 50 mg by mouth 2 (two) times daily.  Yes [provider]  rosuvastatin (CRESTOR) 10 MG tablet Take 1 tablet (10 mg total) by mouth daily. 07/06/22  Yes Rhetta Mura, MD  sevelamer carbonate (RENVELA) 800 MG tablet Take 2 tablets (1,600 mg total) by mouth 3 (three) times daily with meals. Patient taking differently: Take 2,400 mg by mouth 3 (three) times daily with meals. 800 mg with snacks 11/11/18  Yes Hall, Carole N, DO  traZODone (DESYREL) 100 MG tablet Take 100 mg by mouth at bedtime. 08/18/22  Yes [provider]      Allergies    Heparin, Iodinated contrast media, and Penicillin g potassium  [penicillin g]    Review of Systems   Review of Systems  Physical Exam Updated Vital Signs BP 103/68   Pulse 87   Temp 98.2 F (36.8 C)   Resp 18   Wt 118 kg Comment: dry weight after HD on 09/10/22  SpO2 100%   BMI 30.06 kg/m  Physical Exam Vitals and nursing note reviewed.  Constitutional:      General: He is not in acute distress.    Appearance: He is well-developed.  HENT:     Head: Normocephalic and atraumatic.     Right Ear: External ear normal.     Left Ear: External ear normal.     Nose: Nose normal.  Eyes:     Extraocular Movements: Extraocular movements intact.     Conjunctiva/sclera: Conjunctivae normal.     Pupils: Pupils are equal, round, and reactive to light.  Pulmonary:     Effort: Pulmonary effort is normal. No respiratory distress.  Musculoskeletal:     Cervical back: Normal range of motion and neck supple.     Right lower leg: Edema present.     Left lower leg: Edema present.  Skin:    General: Skin is warm and dry.  Neurological:     Mental Status: He is alert. Mental status is at baseline.  Psychiatric:        Mood and Affect: Mood normal.        Behavior: Behavior normal.   Left foot:   Right foot:   ED Results / Procedures / Treatments   Labs (all labs ordered are listed, but only abnormal results are displayed) Labs Reviewed  COMPREHENSIVE METABOLIC PANEL - Abnormal; Notable for the following components:      Result Value   Chloride 95 (*)    Glucose, Bld 111 (*)    BUN 27 (*)    Creatinine, Ser 7.33 (*)    Albumin 3.1 (*)    AST 10 (*)    GFR, Estimated 8 (*)    Anion gap 16 (*)    All other components within normal limits  CBC WITH DIFFERENTIAL/PLATELET - Abnormal; Notable for the following components:   RBC 3.76 (*)    Hemoglobin 11.3 (*)    HCT 37.5 (*)    RDW 16.4 (*)    All other components within normal limits  CBC WITH DIFFERENTIAL/PLATELET - Abnormal; Notable for the following components:   RBC 3.63 (*)     Hemoglobin 11.0 (*)    HCT 35.0 (*)    RDW 16.3 (*)    All other components within normal limits  RENAL FUNCTION PANEL - Abnormal; Notable for the following components:   Sodium 134 (*)    Chloride 96 (*)    Glucose, Bld 102 (*)    BUN 31 (*)    Creatinine, Ser 7.90 (*)    Phosphorus  6.8 (*)    Albumin 2.8 (*)    GFR, Estimated 7 (*)    All other components within normal limits  CBG MONITORING, ED - Abnormal; Notable for the following components:   Glucose-Capillary 111 (*)    All other components within normal limits  CULTURE, BLOOD (ROUTINE X 2)  CULTURE, BLOOD (ROUTINE X 2)  HEMOGLOBIN A1C  HIV ANTIBODY (ROUTINE TESTING W REFLEX)  I-STAT CG4 LACTIC ACID, ED    EKG None  Radiology No results found.  Procedures Procedures    Medications Ordered in ED Medications  vancomycin (VANCOCIN) 2,500 mg in sodium chloride 0.9 % 500 mL IVPB (has no administration in time range)  insulin aspart (novoLOG) injection 0-6 Units (has no administration in time range)  ARIPiprazole (ABILIFY) tablet 10 mg (has no administration in time range)  divalproex (DEPAKOTE) DR tablet 250 mg (has no administration in time range)    And  divalproex (DEPAKOTE) DR tablet 500 mg (500 mg Oral Given 09/12/23 2356)  DULoxetine (CYMBALTA) DR capsule 60 mg (has no administration in time range)  apixaban (ELIQUIS) tablet 2.5 mg (has no administration in time range)  hydrocortisone (ANUSOL-HC) 2.5 % rectal cream 1 Application (has no administration in time range)  hydrOXYzine (ATARAX) tablet 25 mg (25 mg Oral Given 09/12/23 2357)  midodrine (PROAMATINE) tablet 10 mg (has no administration in time range)  mycophenolate (MYFORTIC) EC tablet 720 mg (720 mg Oral Given 09/12/23 2359)  pregabalin (LYRICA) capsule 50 mg (50 mg Oral Given 09/12/23 2356)  rosuvastatin (CRESTOR) tablet 10 mg (has no administration in time range)  sevelamer carbonate (RENVELA) tablet 2,400 mg (has no administration in time range)   traZODone (DESYREL) tablet 100 mg (100 mg Oral Given 09/12/23 2359)  piperacillin-tazobactam (ZOSYN) IVPB 2.25 g (has no administration in time range)  vancomycin variable dose per unstable renal function (pharmacist dosing) (has no administration in time range)  cycloSPORINE modified (GENGRAF) capsule 25 mg (has no administration in time range)    And  cycloSPORINE modified (GENGRAF) capsule 100 mg (has no administration in time range)  cefTRIAXone (ROCEPHIN) 1 g in sodium chloride 0.9 % 100 mL IVPB (0 g Intravenous Stopped 09/12/23 2241)    ED Course/ Medical Decision Making/ A&P Clinical Course as of 09/13/23 0029  Thu Sep 12, 2023  2151 Dr Lenell Antu from vascular surgery will see the patient in the morning.  [RP]  2206 Dr Geraldo Pitter from internal medicine to admit. Dr Ninetta Lights will be the attending.  [RP]    Clinical Course User Index [RP] Rondel Baton, MD                                 Medical Decision Making Amount and/or Complexity of Data Reviewed Labs: ordered.  Risk Decision regarding hospitalization.   [name] is a 63 y.o. male with comorbidities that complicate the patient evaluation including dementia, stroke, CHF status post heart transplant, atrial fibrillation, diabetes, hypertension, and hyperlipidemia who presents emergency department with discoloration of his toes on the left side.     Initial Ddx:  Dry gangrene, wet gangrene, diabetic foot wound, osteomyelitis, acute limb ischemia  MDM/Course:  Patient presents emergency department with discoloration of his toes.  Appears to be gangrenous at this time.  Does have some drainage from the area as well.  Concerned that he probably has arterial insufficiency that is led to gangrene which may be becoming infected at  this time.  No signs of necrotizing fasciitis.  He has an MRI that is pending at this time.  Is not having significant pain so low concern for acute limb ischemia currently.  Did also recently have  ABIs on 09/06/2023.  Discussed with vascular surgery who will evaluate him in the morning.  They wanted to be n.p.o. at midnight since they may perform an angiogram tomorrow.  Admitted to internal medicine for further management.   This patient presents to the ED for concern of complaints listed in HPI, this involves an extensive number of treatment options, and is a complaint that carries with it a high risk of complications and morbidity. Disposition including potential need for admission considered.   Dispo: Admit to Floor  Additional history obtained from family Records reviewed Outpatient Clinic Notes The following labs were independently interpreted: Chemistry and show CKD I have reviewed the patients home medications and made adjustments as needed Consults: Vascular Surgery  Portions of this note were generated with Dragon dictation software. Dictation errors may occur despite best attempts at proofreading.     Final Clinical Impression(s) / ED Diagnoses Final diagnoses:  Gangrene of foot (HCC)  Diabetic foot infection [facility])    Rx / DC Orders ED Discharge Orders     None         Rondel Baton, MD 09/13/23 [phone]

## 2023-09-12 NOTE — ED Triage Notes (Signed)
Pt is coming in after being here 2 days ago for the same issue. Him and the daughter reports he has a potential infection in both feet, as well as circulation issues. His PCP sent him back today for possible gangrene in his feet. Pt expresses some pain in his left heel. Pt is not on any outpatient abx as well. No reported fevers at home.

## 2023-09-12 NOTE — Progress Notes (Signed)
Pharmacy Antibiotic Note  [name] is a 63 y.o. male admitted on 09/12/2023 with foot discoloration and concerns for  wound infection.  Pharmacy has been consulted for zosyn/vancomycin dosing.PMH includes ESRD (hemodialysis)  -WBC 7.1, afebrile, lactate 1.9 -MRI of left foot pending -Ceftriaxone x1 given in ED  Plan: -Zosyn 2.25g IV every 8 hours -Vancomycin 2500 mg IV x1 -Vancomycin variable dosing until HD schedule finalized -Follow up signs of clinical improvement, LOT, de-escalation of antibiotics     Temp (24hrs), Avg:97.9 F (36.6 C), Min:97.6 F (36.4 C), Max:98.2 F (36.8 C)  Recent Labs  Lab 09/06/23 0853 09/06/23 0921 09/12/23 1700 09/12/23 1707  WBC 6.5  --  7.1  --   CREATININE 8.27*  --  7.33*  --   LATICACIDVEN  --  1.7  --  1.9    CrCl cannot be calculated (Unknown ideal weight.).    Antimicrobials this admission: Vancomycin 1/17 >>  Zosyn 1/17 >>   Microbiology results: 1/16 BCx:   Thank you for allowing pharmacy to be a part of this patient's care.  Arabella Merles, PharmD. Clinical Pharmacist 09/12/2023 11:22 PM

## 2023-09-12 NOTE — ED Notes (Signed)
MRI tech informed triage RN that patient refused MRI .

## 2023-09-12 NOTE — Hospital Course (Addendum)
Admitted 09/12/2023  Allergies: Heparin, Iodinated contrast media, and Penicillin g potassium [penicillin g] Pertinent Hx: ESRD on HD MWF, heart transplant on IS, PAD, DM w/ neuropathy, afib on eliquis  63 y.o. male p/w left foot infection  * Left foot cellulitis/OM vs gangrene: on vanc/zosyn, pending MRI, vascular seeing in the AM, likely angiogram, continued eliquis but might need to change to IV heparin in the AM  *Other: continued immunosuppression, will need nephro consult for HD  Consults: VVS  Meds: home meds +abx VTE ppx: doac IVF: none Diet: Renal NPO MN

## 2023-09-12 NOTE — ED Provider Triage Note (Signed)
Emergency Medicine Provider Triage Evaluation Note  [name] , a 63 y.o. male  was evaluated in triage.  Pt complains of bilateral foot pain.  Sent in by PCP to rule out osteomyelitis.  Seen recently and had plain films, no MRIs.  Review of Systems  Positive: bilateral foot swelling Negative: Fever  Physical Exam  BP 100/69 (BP Location: Right Arm)   Pulse 88   Resp 16   SpO2 99%  Gen:   Awake, no distress   Resp:  Normal effort  MSK:   Moves extremities without difficulty  Other:    Medical Decision Making  Medically screening exam initiated at 4:51 PM.  Appropriate orders placed.  [name] was informed that the remainder of the evaluation will be completed by another provider, this initial triage assessment does not replace that evaluation, and the importance of remaining in the ED until their evaluation is complete.     Arthor Captain, PA-C 09/12/23 1652

## 2023-09-13 ENCOUNTER — Other Ambulatory Visit: Payer: Self-pay

## 2023-09-13 DIAGNOSIS — I159 Secondary hypertension, unspecified: Secondary | ICD-10-CM

## 2023-09-13 DIAGNOSIS — Z941 Heart transplant status: Secondary | ICD-10-CM

## 2023-09-13 DIAGNOSIS — I96 Gangrene, not elsewhere classified: Secondary | ICD-10-CM

## 2023-09-13 LAB — CBG MONITORING, ED
Glucose-Capillary: 100 mg/dL — ABNORMAL HIGH (ref 70–99)
Glucose-Capillary: 56 mg/dL — ABNORMAL LOW (ref 70–99)
Glucose-Capillary: 58 mg/dL — ABNORMAL LOW (ref 70–99)
Glucose-Capillary: 64 mg/dL — ABNORMAL LOW (ref 70–99)
Glucose-Capillary: 69 mg/dL — ABNORMAL LOW (ref 70–99)
Glucose-Capillary: 77 mg/dL (ref 70–99)
Glucose-Capillary: 83 mg/dL (ref 70–99)
Glucose-Capillary: 91 mg/dL (ref 70–99)
Glucose-Capillary: 92 mg/dL (ref 70–99)
Glucose-Capillary: 99 mg/dL (ref 70–99)

## 2023-09-13 LAB — LIPID PANEL
Cholesterol: 147 mg/dL (ref 0–200)
HDL: 52 mg/dL (ref >40)
LDL Cholesterol: 79 mg/dL (ref 0–99)
Total CHOL/HDL Ratio: 2.8 {ratio}
Triglycerides: 78 mg/dL (ref <150)
VLDL: 16 mg/dL (ref 0–40)

## 2023-09-13 LAB — HIV ANTIBODY (ROUTINE TESTING W REFLEX): HIV Screen 4th Generation wRfx: NONREACTIVE

## 2023-09-13 LAB — CBC WITH DIFFERENTIAL/PLATELET
Abs Immature Granulocytes: 0.04 10*3/uL (ref 0.00–0.07)
Basophils Absolute: 0.1 10*3/uL (ref 0.0–0.1)
Basophils Relative: 1 %
Eosinophils Absolute: 0.1 10*3/uL (ref 0.0–0.5)
Eosinophils Relative: 2 %
HCT: 35 % — ABNORMAL LOW (ref 39.0–52.0)
Hemoglobin: 11 g/dL — ABNORMAL LOW (ref 13.0–17.0)
Immature Granulocytes: 1 %
Lymphocytes Relative: 16 %
Lymphs Abs: 1 10*3/uL (ref 0.7–4.0)
MCH: 30.3 pg (ref 26.0–34.0)
MCHC: 31.4 g/dL (ref 30.0–36.0)
MCV: 96.4 fL (ref 80.0–100.0)
Monocytes Absolute: 0.7 10*3/uL (ref 0.1–1.0)
Monocytes Relative: 11 %
Neutro Abs: 4.5 10*3/uL (ref 1.7–7.7)
Neutrophils Relative %: 69 %
Platelets: 208 10*3/uL (ref 150–400)
RBC: 3.63 MIL/uL — ABNORMAL LOW (ref 4.22–5.81)
RDW: 16.3 % — ABNORMAL HIGH (ref 11.5–15.5)
WBC: 6.3 10*3/uL (ref 4.0–10.5)
nRBC: 0 % (ref 0.0–0.2)

## 2023-09-13 LAB — RENAL FUNCTION PANEL
Albumin: 2.8 g/dL — ABNORMAL LOW (ref 3.5–5.0)
Anion gap: 14 (ref 5–15)
BUN: 31 mg/dL — ABNORMAL HIGH (ref 8–23)
CO2: 24 mmol/L (ref 22–32)
Calcium: 9.4 mg/dL (ref 8.9–10.3)
Chloride: 96 mmol/L — ABNORMAL LOW (ref 98–111)
Creatinine, Ser: 7.9 mg/dL — ABNORMAL HIGH (ref 0.61–1.24)
GFR, Estimated: 7 mL/min — ABNORMAL LOW (ref >60)
Glucose, Bld: 102 mg/dL — ABNORMAL HIGH (ref 70–99)
Phosphorus: 6.8 mg/dL — ABNORMAL HIGH (ref 2.5–4.6)
Potassium: 4.6 mmol/L (ref 3.5–5.1)
Sodium: 134 mmol/L — ABNORMAL LOW (ref 135–145)

## 2023-09-13 LAB — HEMOGLOBIN A1C
Hgb A1c MFr Bld: 5.1 % (ref 4.8–5.6)
Mean Plasma Glucose: 99.67 mg/dL

## 2023-09-13 LAB — MRSA NEXT GEN BY PCR, NASAL: MRSA by PCR Next Gen: NOT DETECTED

## 2023-09-13 LAB — GLUCOSE, CAPILLARY
Glucose-Capillary: 108 mg/dL — ABNORMAL HIGH (ref 70–99)
Glucose-Capillary: 73 mg/dL (ref 70–99)

## 2023-09-13 LAB — HEPATITIS B SURFACE ANTIGEN: Hepatitis B Surface Ag: NONREACTIVE

## 2023-09-13 MED ORDER — ALTEPLASE 2 MG IJ SOLR: 2.0000 mg | Freq: Once | INTRAMUSCULAR | Status: DC | PRN

## 2023-09-13 MED ORDER — CHLORHEXIDINE GLUCONATE CLOTH 2 % EX PADS
6.0000 | MEDICATED_PAD | Freq: Every day | CUTANEOUS | Status: DC
  Administered 2023-09-14 – 2023-09-16 (×3): 6 via TOPICAL

## 2023-09-13 MED ORDER — PENTAFLUOROPROP-TETRAFLUOROETH EX AERO: 1.0000 | INHALATION_SPRAY | CUTANEOUS | Status: DC | PRN

## 2023-09-13 MED ORDER — LIDOCAINE HCL (PF) 1 % IJ SOLN: 5.0000 mL | INTRAMUSCULAR | Status: DC | PRN

## 2023-09-13 MED ORDER — DEXTROSE 50 % IV SOLN
INTRAVENOUS | Status: AC
  Administered 2023-09-13: 50 mL
  Filled 2023-09-13: qty 50

## 2023-09-13 MED ORDER — DEXTROSE 50 % IV SOLN: 1.0000 | Freq: Once | INTRAVENOUS | Status: DC

## 2023-09-13 MED ORDER — DEXTROSE 50 % IV SOLN
1.0000 | Freq: Once | INTRAVENOUS | Status: AC
  Administered 2023-09-13: 50 mL via INTRAVENOUS
  Filled 2023-09-13: qty 50

## 2023-09-13 MED ORDER — DEXTROSE 50 % IV SOLN
25.0000 mL | Freq: Once | INTRAVENOUS | Status: AC
  Administered 2023-09-13: 25 mL via INTRAVENOUS
  Filled 2023-09-13: qty 50

## 2023-09-13 MED ORDER — LIDOCAINE-PRILOCAINE 2.5-2.5 % EX CREA: 1.0000 | TOPICAL_CREAM | CUTANEOUS | Status: DC | PRN

## 2023-09-13 MED ORDER — DEXTROSE IN LACTATED RINGERS 5 % IV SOLN: INTRAVENOUS | Status: DC

## 2023-09-13 NOTE — Plan of Care (Signed)
  Problem: Education: Goal: Ability to describe self-care measures that may prevent or decrease complications (Diabetes Survival Skills Education) will improve 09/13/2023 1709 by Irwin Brakeman, RN Outcome: Progressing 09/13/2023 1709 by Irwin Brakeman, RN Outcome: Progressing Goal: Individualized Educational Video(s) 09/13/2023 1709 by Irwin Brakeman, RN Outcome: Progressing 09/13/2023 1709 by Irwin Brakeman, RN Outcome: Progressing   Problem: Coping: Goal: Ability to adjust to condition or change in health will improve 09/13/2023 1709 by Irwin Brakeman, RN Outcome: Progressing 09/13/2023 1709 by Irwin Brakeman, RN Outcome: Progressing   Problem: Fluid Volume: Goal: Ability to maintain a balanced intake and output will improve 09/13/2023 1709 by Irwin Brakeman, RN Outcome: Progressing 09/13/2023 1709 by Irwin Brakeman, RN Outcome: Progressing   Problem: Health Behavior/Discharge Planning: Goal: Ability to identify and utilize available resources and services will improve 09/13/2023 1709 by Irwin Brakeman, RN Outcome: Progressing 09/13/2023 1709 by Irwin Brakeman, RN Outcome: Progressing Goal: Ability to manage health-related needs will improve 09/13/2023 1709 by Irwin Brakeman, RN Outcome: Progressing 09/13/2023 1709 by Irwin Brakeman, RN Outcome: Progressing   Problem: Metabolic: Goal: Ability to maintain appropriate glucose levels will improve 09/13/2023 1709 by Irwin Brakeman, RN Outcome: Progressing 09/13/2023 1709 by Irwin Brakeman, RN Outcome: Progressing   Problem: Nutritional: Goal: Maintenance of adequate nutrition will improve 09/13/2023 1709 by Irwin Brakeman, RN Outcome: Progressing 09/13/2023 1709 by Irwin Brakeman, RN Outcome: Progressing Goal: Progress toward achieving an optimal weight will improve 09/13/2023 1709 by Irwin Brakeman, RN Outcome: Progressing 09/13/2023 1709 by Irwin Brakeman, RN Outcome: Progressing   Problem: Skin Integrity: Goal: Risk for impaired skin integrity will decrease 09/13/2023 1709 by Irwin Brakeman, RN Outcome: Not Progressing 09/13/2023 1709 by Irwin Brakeman, RN Outcome: Progressing   Problem: Tissue Perfusion: Goal: Adequacy of tissue perfusion will improve 09/13/2023 1709 by Irwin Brakeman, RN Outcome: Not Progressing 09/13/2023 1709 by Irwin Brakeman, RN Outcome: Progressing  Surgery possible

## 2023-09-13 NOTE — ED Notes (Signed)
BGL 92

## 2023-09-13 NOTE — ED Notes (Signed)
BGL 99

## 2023-09-13 NOTE — Progress Notes (Signed)
Subjective Endorses bilateral left >right foot pain.   Physical exam Blood pressure 91/67, pulse 76, temperature 97.6 F (36.4 C), temperature source Oral, resp. rate (!) 21, height 6\' 7"  (2.007 m), weight 96.6 kg, SpO2 100%.  Physical Exam: Constitutional: lying in bed, in no acute distress Cardiovascular: regular rate and rhythm, no m/r/g, 3+ LEE bilaterally from foot to mid shin, DP/PT pulses unable to be palpated Pulmonary/Chest: normal work of breathing on room air, lungs clear to auscultation bilaterally Abdominal: soft, non-tender, non-distended MSK: normal bulk and tone Neurological: alert & oriented x 3, no focal deficit Skin: L foot with marked darkening, crusting of his 1st - 5th digits to distal midfoot, no appreciable temperature differences bilaterally, mild tenderness to palpation Psych: normal mood and behavior   Weight change:    Intake/Output Summary (Last 24 hours) at 09/13/2023 1346 Last data filed at 09/13/2023 0332 Gross per 24 hour  Intake 646.35 ml  Output --  Net 646.35 ml   Net IO Since Admission: 646.35 mL [09/13/23 1346]  Labs, images, and other studies    Latest Ref Rng & Units 09/12/2023   11:50 PM 09/12/2023    5:00 PM 09/06/2023    8:53 AM  CBC  WBC 4.0 - 10.5 K/uL 6.3  7.1  6.5   Hemoglobin 13.0 - 17.0 g/dL 19.1  47.8  29.5   Hematocrit 39.0 - 52.0 % 35.0  37.5  33.4   Platelets 150 - 400 K/uL 208  217  197        Latest Ref Rng & Units 09/12/2023   11:50 PM 09/12/2023    5:00 PM 09/06/2023    8:53 AM  BMP  Glucose 70 - 99 mg/dL 621  308  78   BUN 8 - 23 mg/dL 31  27  28    Creatinine 0.61 - 1.24 mg/dL 6.57  8.46  9.62   Sodium 135 - 145 mmol/L 134  135  136   Potassium 3.5 - 5.1 mmol/L 4.6  4.1  4.2   Chloride 98 - 111 mmol/L 96  95  95   CO2 22 - 32 mmol/L 24  24  26    Calcium 8.9 - 10.3 mg/dL 9.4  9.8  9.5      Assessment and plan Hospital day 1  [name] is a 63 y.o.male with history of atrial  fibrillation on Eliquis, CVA, hyperlipidemia and hypertension who presented to the ED from his PCP office due to concerns for worsening foot gangrene.   Principal Problem:   Gangrene of foot (HCC) Active Problems:   Heart transplant recipient [facility])   Secondary hypertension  #Dry Gangrene of Left Foot Presented with worsening gangrene of the left foot compared to a week ago.  Afebrile without leukocytosis.  See physical exam.  MRI left foot complete, read pending. Vascular surgery consulted. Will continue antibiotics to cover for osteomyelitis/soft tissue infections pending MRI/further recommendations.  Unfortunately ABIs during last admission were unreliable 2/2 arterial wall calcification.  Unsure of the utility at this point. -Appreciate vascular surgery recommendations -Continue Vancomycin and Zosyn (Day 1) -Follow-up on MRI  -NPO for potential surgical intervention   #ESRD on HD MWF Nephrology following, appreciate recommendations.  Will proceed with inpatient HD.  #History of CVA -Repeat lipid panel, pending -Continue home Crestor 10 mg   #History of anxiety and depression  - Continue on home hydroxyzine, Abilify and Depakote    #  History of A-fib Currently NSR.  Holding Eliquis pending potential need for surgical intervention. -Continue telemetry   #Hx of Heart transplant  - Resume immunosuppressants. Cyclosporine and mycophenolate    #Hx of recurrent hemorrhoids  - Resume home rectal hydrocortisone cream   #Hx of peripheral neuropathy - Resume home Pregabalin    Diet: NPO IVF: D5 LR at 75 cc/hour VTE: apixaban (ELIQUIS) tablet 2.5 mg Start: 09/12/23 2315 Code: FULL  Surrogate: Wife and Daughter   Carmina Miller, DO 09/13/2023, 1:46 PM  Pager: [phone] After 5pm or weekend: [phone]   Internal Medicine Attending:  I personally saw and examined the patient. Discussed with the resident and agree with the resident's findings and plan of care as documented in the  resident's note. Ginnie Smart, MD

## 2023-09-13 NOTE — ED Notes (Signed)
RN gave pt 8oz of orange juice, will repeat bs

## 2023-09-13 NOTE — Progress Notes (Signed)
[facility] Liaison Note:  This patient is currently enrolled in [facility] outpatient-based palliative care as well as receiving SN/PT/OT services through [facility].  Please reorder appropriate resumption of care for SN/PT/OT at discharge.  Please call for any outpatient based palliative care related questions or concerns. Thank you,  Glenna Fellows, BSN, RN, OCN [facility] Liaison [phone]

## 2023-09-13 NOTE — Consult Note (Signed)
ED Consult  History of Present Illness:    Previously: Patient is a 63 y.o. year old male who presents for evaluation of arterial inflow with left foot ischemia.  He is followed by DPM Dr. Gillie Manners. Patel.  He has known peripheral neuropathy with DM, CKD, history of heart transplant, dementia, hyperlipidemia, and Afib. We have been consulted to evaluate his blood flow.  His family is with him.  He does not walk.  He states the left foot toes turn dark a few days ago.  He denies rest pain and motor of the toes is still intact.  He denies fever and chills.     09/13/23: Patient admitted to hospital for worsening L foot gangrene. He is unfortunately optimized from a vascular standpoint.  I reviewed his workup in detail with his daughter, who is at the bedside.  The patient is not very interactive with me.   Past Medical History:  Diagnosis Date   Anxiety    Arthritis    Atrial fibrillation (HCC)    CHF (congestive heart failure), NYHA class III (HCC)    s/p heart transplant   Chronic kidney disease    end stage   Chronic systolic dysfunction of left ventricle    CVA (cerebral infarction)    Dementia (HCC)    Depression    Diabetes mellitus    PMH; Prior to heart transplant   Family history of coronary artery disease    in both parents   GERD (gastroesophageal reflux disease)    Hyperlipidemia    Hypertension    Left bundle branch block    Morbid obesity (HCC)    status post lap band   Myocardial infarction [facility])    prior to heart transplant   Nonischemic cardiomyopathy (HCC)    prior to heart transplant   Obesity (BMI 30-39.9)    Obstructive sleep apnea    no longer needs CPAP after heart transplant per pt   Premature ventricular contractions    Prostate cancer (HCC)    SOB (shortness of breath)     Past Surgical History:  Procedure Laterality Date   A/V FISTULAGRAM Right 05/23/2023   Procedure: A/V Fistulagram;  Surgeon: Victorino Sparrow, MD;  Location: [facility] INVASIVE CV  LAB;  Service: Cardiovascular;  Laterality: Right;   AV FISTULA PLACEMENT Right 07/04/2023   Procedure: ARTERIOVENOUS (AV) FISTULA  REVISION WITH APPLICATION OF ARTEGRAFT RIGHT ARM;  Surgeon: Maeola Harman, MD;  Location: [facility] OR;  Service: Vascular;  Laterality: Right;   CARDIAC PACEMAKER PLACEMENT  09/21/2009   Biventricular implantable cardioverter-defibrillator implantation      COLONOSCOPY W/ BIOPSIES AND POLYPECTOMY     CYSTOSCOPY WITH FULGERATION N/A 04/27/2021   Procedure: CYSTOSCOPY AND CLOT EVACUATION WITH BLADDER BIOPSY/ FUGARATION OF BLADDER/ FULGARATION OF PROSTATE ;  Surgeon: Jerilee Field, MD;  Location: [facility] ORS;  Service: Urology;  Laterality: N/A;   FOOT SURGERY     left   HEART TRANSPLANT  2014   INSERTION OF DIALYSIS CATHETER Right 07/04/2023   Procedure: INSERTION OF DIALYSIS CATHETER WITH 19 CM PALINDROME;  Surgeon: Maeola Harman, MD;  Location: [facility] OR;  Service: Vascular;  Laterality: Right;   LAPAROSCOPIC GASTRIC BANDING  01/27/2007   LEFT VENTRICULAR ASSIST DEVICE     implanted at [facility]   PACEMAKER REMOVAL     PROSTATE BIOPSY     TOTAL HIP ARTHROPLASTY Right 07/22/2017   Procedure: RIGHT TOTAL HIP ARTHROPLASTY ANTERIOR APPROACH;  Surgeon: Samson Frederic, MD;  Location: [facility] OR;  Service: Orthopedics;  Laterality: Right;  Needs RNFA   TOTAL KNEE ARTHROPLASTY Right 07/22/2017   Social History Social History   Tobacco Use   Smoking status: Never   Smokeless tobacco: Former    Types: Snuff  Vaping Use   Vaping status: Never Used  Substance Use Topics   Alcohol use: No   Drug use: No    Family History Family History  Problem Relation Age of Onset   Coronary artery disease Father        had, PTCA & CABG   Heart attack Father    Hypertension Father    Diabetes Father    Prostate cancer Father    Coronary artery disease Mother    Heart attack Mother    Hypertension Mother    Stroke Mother    Lupus Mother    Prostate cancer Brother     Coronary artery disease Brother    Coronary artery disease Sister    Heart attack Sister    Prostate cancer Paternal Uncle    Coronary artery disease Maternal Grandmother    Coronary artery disease Maternal Grandfather    Coronary artery disease Paternal Grandmother    Coronary artery disease Paternal Grandfather     Allergies  Allergies  Allergen Reactions   Heparin Nausea Only, Swelling and Other (See Comments)    * * HIT * *  SWELLING REACTION UNSPECIFIED   DIAPHORESIS    Iodinated Contrast Media     Unknown reaction   Penicillin G Potassium [Penicillin G] Nausea Only and Other (See Comments)    DIAPHORESIS High Doses     Current Facility-Administered Medications  Medication Dose Route Frequency Provider Last Rate Last Admin   0.9 %  sodium chloride infusion  250 mL Intravenous PRN Cephus Shelling, MD       ARIPiprazole (ABILIFY) tablet 10 mg  10 mg Oral q morning Rocky Morel, DO   10 mg at 09/13/23 0943   [START ON 09/14/2023] Chlorhexidine Gluconate Cloth 2 % PADS 6 each  6 each Topical Q0600 Julien Nordmann, PA-C       cycloSPORINE modified (GENGRAF) capsule 25 mg  25 mg Oral BID Rocky Morel, DO   25 mg at 09/13/23 1610   And   cycloSPORINE modified (GENGRAF) capsule 100 mg  100 mg Oral BID Rocky Morel, DO   100 mg at 09/13/23 0941   dextrose 5 % in lactated ringers infusion   Intravenous Continuous Carmina Miller, DO 75 mL/hr at 09/13/23 1215 New Bag at 09/13/23 1215   divalproex (DEPAKOTE) DR tablet 250 mg  250 mg Oral q morning Rocky Morel, DO   250 mg at 09/13/23 9604   And   divalproex (DEPAKOTE) DR tablet 500 mg  500 mg Oral QHS Rocky Morel, DO   500 mg at 09/12/23 2356   DULoxetine (CYMBALTA) DR capsule 60 mg  60 mg Oral Daily Rocky Morel, DO   60 mg at 09/13/23 5409   hydrocortisone (ANUSOL-HC) 2.5 % rectal cream 1 Application  1 Application Topical PRN Rocky Morel, DO       hydrOXYzine (ATARAX) tablet 25 mg  25 mg Oral BID  Rocky Morel, DO   25 mg at 09/13/23 0943   insulin aspart (novoLOG) injection 0-6 Units  0-6 Units Subcutaneous Q4H Rocky Morel, DO       midodrine (PROAMATINE) tablet 10 mg  10 mg Oral TID WC Rocky Morel, DO   10 mg at 09/13/23  1222   mycophenolate (MYFORTIC) EC tablet 720 mg  720 mg Oral BID Rocky Morel, DO   720 mg at 09/13/23 0939   piperacillin-tazobactam (ZOSYN) IVPB 2.25 g  2.25 g Intravenous Q8H Arabella Merles, RPH   Stopped at 09/13/23 1054   pregabalin (LYRICA) capsule 50 mg  50 mg Oral BID Rocky Morel, DO   50 mg at 09/13/23 7829   rosuvastatin (CRESTOR) tablet 10 mg  10 mg Oral Daily Rocky Morel, DO   10 mg at 09/13/23 5621   sevelamer carbonate (RENVELA) tablet 2,400 mg  2,400 mg Oral TID WC Rocky Morel, DO   2,400 mg at 09/13/23 1222   sodium chloride flush (NS) 0.9 % injection 3 mL  3 mL Intravenous Q12H Cephus Shelling, MD       traZODone (DESYREL) tablet 100 mg  100 mg Oral QHS Rocky Morel, DO   100 mg at 09/12/23 2359   vancomycin variable dose per unstable renal function (pharmacist dosing)   Does not apply See admin instructions Arabella Merles, [facility]       Current Outpatient Medications  Medication Sig Dispense Refill   acetaminophen (TYLENOL) 500 MG tablet Take 1,000 mg by mouth 2 (two) times daily as needed for moderate pain (pain score 4-6).     ARIPiprazole (ABILIFY) 10 MG tablet Take 10 mg by mouth every morning.     cycloSPORINE modified (NEORAL) 100 MG capsule Take 100 mg by mouth See admin instructions. Take with 25 mg for a total of 125 mg twice daily     cycloSPORINE modified 25 MG CAPS Take 25 mg by mouth See admin instructions. Take with 100 mg for a total of 125 mg twice daily     divalproex (DEPAKOTE) 125 MG DR tablet Take 250 mg by mouth in morning and 500 mg by mouth at night (Patient taking differently: Take 250-500 mg by mouth See admin instructions. Take 250 mg by mouth in morning and 500 mg by mouth at night) 180 tablet 11    DULoxetine (CYMBALTA) 60 MG capsule Take 1 capsule (60 mg total) by mouth daily. 90 capsule 3   ELIQUIS 2.5 MG TABS tablet Take 1 tablet (2.5 mg total) by mouth 2 (two) times daily. 60 tablet    hydrocortisone (ANUSOL-HC) 2.5 % rectal cream Apply 1 Application topically as needed for hemorrhoids or anal itching.     hydrOXYzine (ATARAX) 25 MG tablet Take 25 mg by mouth 2 (two) times daily.     ibuprofen (ADVIL) 200 MG tablet Take 400 mg by mouth as needed for moderate pain (pain score 4-6) or mild pain (pain score 1-3).     midodrine (PROAMATINE) 10 MG tablet Take 1 tablet (10 mg total) by mouth 3 (three) times daily. (Patient taking differently: Take 10 mg by mouth every Monday, Wednesday, and Friday with hemodialysis. Take 10 mg twice a day on non-dialysis days)     mycophenolate (MYFORTIC) 180 MG EC tablet Take 720 mg by mouth 2 (two) times daily.     naloxone (NARCAN) nasal spray 4 mg/0.1 mL Place 0.4 mg into the nose once as needed (opioid overdose).     oxyCODONE-acetaminophen (PERCOCET) 5-325 MG tablet Take 1 tablet by mouth every 6 (six) hours as needed for severe pain (pain score 7-10). Do not take with other opiate pain medication 20 tablet 0   pregabalin (LYRICA) 50 MG capsule Take 50 mg by mouth 2 (two) times daily.     rosuvastatin (CRESTOR) 10 MG  tablet Take 1 tablet (10 mg total) by mouth daily. 30 tablet 1   sevelamer carbonate (RENVELA) 800 MG tablet Take 2 tablets (1,600 mg total) by mouth 3 (three) times daily with meals. (Patient taking differently: Take 2,400 mg by mouth 3 (three) times daily with meals. 800 mg with snacks) 120 tablet 0   traZODone (DESYREL) 100 MG tablet Take 100 mg by mouth at bedtime.      Physical Examination  Vitals:   09/13/23 0930 09/13/23 1000 09/13/23 1208 09/13/23 1253  BP: 95/66 102/71 (!) 88/62 91/67  Pulse: 75 78 76   Resp: 15 (!) 22 (!) 21   Temp:   97.6 F (36.4 C)   TempSrc:   Oral   SpO2: 97% 100% 100%   Weight:      Height:         Body mass index is 24 kg/m.  Obese man in no distress Regular rate and rhythm Unlabored breathing 2+ left dorsalis pedis pulse  DATA:  ABI Findings:  +---------+------------------+-----+---------+----------------------+  Right   Rt Pressure (mmHg)IndexWaveform Comment                 +---------+------------------+-----+---------+----------------------+  Brachial 101                    biphasic HX of HD access in RUE  +---------+------------------+-----+---------+----------------------+  PTA                            triphasicnon compressible        +---------+------------------+-----+---------+----------------------+  DP                             triphasicnon compressible        +---------+------------------+-----+---------+----------------------+  Great Toe73                0.67 Normal                           +---------+------------------+-----+---------+----------------------+   +---------+------------------+-----+---------+-------+  Left    Lt Pressure (mmHg)IndexWaveform Comment  +---------+------------------+-----+---------+-------+  Brachial 109                    triphasic         +---------+------------------+-----+---------+-------+  PTA                            triphasic         +---------+------------------+-----+---------+-------+  DP                             triphasic         +---------+------------------+-----+---------+-------+  Great Toe75                0.69 Normal            +---------+------------------+-----+---------+-------+     Arterial wall calcification precludes accurate ankle pressures and ABIs.    Summary:  Right: Resting right ankle-brachial index indicates noncompressible right  lower extremity arteries. The right toe-brachial index is mildly reduced.   Left: Resting left ankle-brachial index indicates noncompressible left  lower extremity arteries. The left  toe-brachial index is mildly reduced.     ASSESSMENT/PLAN:  [name] is a 63 y.o. male with small vessel disease of the feet likely due to diabetes,  end stage renal disease.  Recommend:  Abstinence from all tobacco products. Blood glucose control with goal A1c < 7%. Blood pressure control with goal blood pressure < 140/90 mmHg. Lipid reduction therapy with goal LDL-C <100 mg/dL  Aspirin 81mg  PO QD.  Atorvastatin 40-80mg  PO QD (or other "high intensity" statin therapy).  No indication for angiography, as patient has a strongly palpable pulse in the dorsalis pedis in the midfoot.  I reviewed our findings in detail with the daughter at bedside.  I think he would be best served by transmetatarsal amputation.  The patient's daughter feels like he would likely consent to this, but would like to discuss this as a family.  Encouraged her to do this.  Dr. Chestine Spore will round this weekend and see if they would like to proceed with left transmetatarsal amputation early next week.  Rande Brunt. Lenell Antu, MD [facility] Phone Number: [phone] 09/13/2023 2:05 PM

## 2023-09-13 NOTE — Progress Notes (Signed)
NEW ADMISSION NOTE New Admission Note:   Arrival Method: stretcher from ER Mental Orientation: alert and orient x 4 Telemetry: not ordered Assessment: Completed Skin: see LDA for documentation on toes IV: L AC not NSL Pain: none Tubes: HD cath in R chest Safety Measures: Safety Fall Prevention Plan has been given, discussed and signed Admission: Completed [facility] Orientation: Patient has been orientated to the room, unit and staff.  Family: at bedside  Orders have been reviewed and implemented. Will continue to monitor the patient. Call light has been placed within reach and bed alarm has been activated.   Irwin Brakeman, RN

## 2023-09-13 NOTE — Consult Note (Signed)
[facility] Renal Consultation Note    Indication for Consultation:  Management of ESRD/hemodialysis, anemia, hypertension/volume, and secondary hyperparathyroidism.  HPI: [name] is a 63 y.o. male with ESRD, Hx heart transplant, A-fib (on Eliquis), T2DM, Hx CVA, HTN who is being admitted with worsened gangrenous changes to L toes.   Was seen by podiatry and then in ED on 09/06/23 with darkening of L toes, felt early gangrenous changes. Vascular was consulted at that time, note indicates "diabetic small vessel disease with ischemic toes." Plan was discharge with very close vascular f/u for intervention. Unfortunately, the wound has progressed and now infected. Presented back to ED yesterday - vitals ok, afebrile. Labs with K 4.1, CO2 24, Alb 3.1, WBC 7.1, Hgb 11.3, Plts 217. He underwent L foot MRI -- read is pending. Blood Cx collected and he was started on Vanc/Zosyn. He was admitted, vascular surgery re-consulted.  Seen today in ED bed - he is tired, awaiting room. Daughter at beside, reports hypoglycemia this AM - on D50 drip at this time. He denies CP/dyspnea, abd pain, N/V/D.   Dialyzes at [facility] unit on MWF schedule - last HD was Wednesday, he is due for dialysis today. Uses R sided TDC as his access - denies recent issues.  Past Medical History:  Diagnosis Date   Anxiety    Arthritis    Atrial fibrillation (HCC)    CHF (congestive heart failure), NYHA class III (HCC)    s/p heart transplant   Chronic kidney disease    end stage   Chronic systolic dysfunction of left ventricle    CVA (cerebral infarction)    Dementia (HCC)    Depression    Diabetes mellitus    PMH; Prior to heart transplant   Family history of coronary artery disease    in both parents   GERD (gastroesophageal reflux disease)    Hyperlipidemia    Hypertension    Left bundle branch block    Morbid obesity (HCC)    status post lap band   Myocardial infarction [facility])    prior to  heart transplant   Nonischemic cardiomyopathy (HCC)    prior to heart transplant   Obesity (BMI 30-39.9)    Obstructive sleep apnea    no longer needs CPAP after heart transplant per pt   Premature ventricular contractions    Prostate cancer (HCC)    SOB (shortness of breath)    Past Surgical History:  Procedure Laterality Date   A/V FISTULAGRAM Right 05/23/2023   Procedure: A/V Fistulagram;  Surgeon: Victorino Sparrow, MD;  Location: [facility] INVASIVE CV LAB;  Service: Cardiovascular;  Laterality: Right;   AV FISTULA PLACEMENT Right 07/04/2023   Procedure: ARTERIOVENOUS (AV) FISTULA  REVISION WITH APPLICATION OF ARTEGRAFT RIGHT ARM;  Surgeon: Maeola Harman, MD;  Location: [facility] OR;  Service: Vascular;  Laterality: Right;   CARDIAC PACEMAKER PLACEMENT  09/21/2009   Biventricular implantable cardioverter-defibrillator implantation      COLONOSCOPY W/ BIOPSIES AND POLYPECTOMY     CYSTOSCOPY WITH FULGERATION N/A 04/27/2021   Procedure: CYSTOSCOPY AND CLOT EVACUATION WITH BLADDER BIOPSY/ FUGARATION OF BLADDER/ FULGARATION OF PROSTATE ;  Surgeon: Jerilee Field, MD;  Location: [facility] ORS;  Service: Urology;  Laterality: N/A;   FOOT SURGERY     left   HEART TRANSPLANT  2014   INSERTION OF DIALYSIS CATHETER Right 07/04/2023   Procedure: INSERTION OF DIALYSIS CATHETER WITH 19 CM PALINDROME;  Surgeon: Maeola Harman, MD;  Location: [facility] OR;  Service: Vascular;  Laterality: Right;   LAPAROSCOPIC GASTRIC BANDING  01/27/2007   LEFT VENTRICULAR ASSIST DEVICE     implanted at [facility]   PACEMAKER REMOVAL     PROSTATE BIOPSY     TOTAL HIP ARTHROPLASTY Right 07/22/2017   Procedure: RIGHT TOTAL HIP ARTHROPLASTY ANTERIOR APPROACH;  Surgeon: Samson Frederic, MD;  Location: [facility] OR;  Service: Orthopedics;  Laterality: Right;  Needs RNFA   TOTAL KNEE ARTHROPLASTY Right 07/22/2017   Family History  Problem Relation Age of Onset   Coronary artery disease Father        had, PTCA & CABG   Heart attack  Father    Hypertension Father    Diabetes Father    Prostate cancer Father    Coronary artery disease Mother    Heart attack Mother    Hypertension Mother    Stroke Mother    Lupus Mother    Prostate cancer Brother    Coronary artery disease Brother    Coronary artery disease Sister    Heart attack Sister    Prostate cancer Paternal Uncle    Coronary artery disease Maternal Grandmother    Coronary artery disease Maternal Grandfather    Coronary artery disease Paternal Grandmother    Coronary artery disease Paternal Grandfather    Social History:  reports that he has never smoked. He has quit using smokeless tobacco.  His smokeless tobacco use included snuff. He reports that he does not drink alcohol and does not use drugs.  ROS: As per HPI otherwise negative.  Physical Exam: Vitals:   09/13/23 0930 09/13/23 1000 09/13/23 1208 09/13/23 1253  BP: 95/66 102/71 (!) 88/62 91/67  Pulse: 75 78 76   Resp: 15 (!) 22 (!) 21   Temp:   97.6 F (36.4 C)   TempSrc:   Oral   SpO2: 97% 100% 100%   Weight:      Height:         General: Well developed, well nourished, in no acute distress. Room air. Head: Normocephalic, atraumatic, sclera non-icteric, mucus membranes are moist. Neck: Supple without lymphadenopathy/masses. JVD not elevated. Lungs: Clear bilaterally to auscultation without wheezes, rales, or rhonchi. Breathing is unlabored. Heart: RRR with normal S1, S2. No murmurs, rubs, or gallops appreciated. Abdomen: Soft, non-tender, non-distended with normoactive bowel sounds. Musculoskeletal:  Strength and tone appear normal for age. Lower extremities: 2+ chronic LE edema; L forefoot and toes with darkened appearance, wet gangrene along 3rd toe. Neuro: Alert and oriented X 3. Moves all extremities spontaneously. Psych:  Responds to questions appropriately with a normal affect. Dialysis Access: TDC in R chest  Allergies  Allergen Reactions   Heparin Nausea Only, Swelling and  Other (See Comments)    * * HIT * *  SWELLING REACTION UNSPECIFIED   DIAPHORESIS    Iodinated Contrast Media     Unknown reaction   Penicillin G Potassium [Penicillin G] Nausea Only and Other (See Comments)    DIAPHORESIS High Doses   Prior to Admission medications   Medication Sig Start Date End Date Taking? Authorizing Provider  acetaminophen (TYLENOL) 500 MG tablet Take 1,000 mg by mouth 2 (two) times daily as needed for moderate pain (pain score 4-6).   Yes [provider]  ARIPiprazole (ABILIFY) 10 MG tablet Take 10 mg by mouth every morning.   Yes [provider]  cycloSPORINE modified (NEORAL) 100 MG capsule Take 100 mg by mouth See admin instructions. Take with 25 mg for a total of  125 mg twice daily   Yes [provider]  cycloSPORINE modified 25 MG CAPS Take 25 mg by mouth See admin instructions. Take with 100 mg for a total of 125 mg twice daily 05/09/23  Yes [provider]  divalproex (DEPAKOTE) 125 MG DR tablet Take 250 mg by mouth in morning and 500 mg by mouth at night Patient taking differently: Take 250-500 mg by mouth See admin instructions. Take 250 mg by mouth in morning and 500 mg by mouth at night 12/17/22  Yes Levert Feinstein, MD  DULoxetine (CYMBALTA) 60 MG capsule Take 1 capsule (60 mg total) by mouth daily. 11/01/22  Yes Levert Feinstein, MD  ELIQUIS 2.5 MG TABS tablet Take 1 tablet (2.5 mg total) by mouth 2 (two) times daily. 04/30/21  Yes Jerilee Field, MD  hydrocortisone (ANUSOL-HC) 2.5 % rectal cream Apply 1 Application topically as needed for hemorrhoids or anal itching. 11/01/22  Yes [provider]  hydrOXYzine (ATARAX) 25 MG tablet Take 25 mg by mouth 2 (two) times daily.   Yes [provider]  ibuprofen (ADVIL) 200 MG tablet Take 400 mg by mouth as needed for moderate pain (pain score 4-6) or mild pain (pain score 1-3).   Yes [provider]  midodrine (PROAMATINE) 10 MG tablet Take 1 tablet (10 mg total) by  mouth 3 (three) times daily. Patient taking differently: Take 10 mg by mouth every Monday, Wednesday, and Friday with hemodialysis. Take 10 mg twice a day on non-dialysis days 04/27/21  Yes Jerilee Field, MD  mycophenolate (MYFORTIC) 180 MG EC tablet Take 720 mg by mouth 2 (two) times daily.   Yes [provider]  naloxone (NARCAN) nasal spray 4 mg/0.1 mL Place 0.4 mg into the nose once as needed (opioid overdose).   Yes [provider]  oxyCODONE-acetaminophen (PERCOCET) 5-325 MG tablet Take 1 tablet by mouth every 6 (six) hours as needed for severe pain (pain score 7-10). Do not take with other opiate pain medication 07/04/23 07/03/24 Yes Baglia, Corrina, PA-C  pregabalin (LYRICA) 50 MG capsule Take 50 mg by mouth 2 (two) times daily.   Yes [provider]  rosuvastatin (CRESTOR) 10 MG tablet Take 1 tablet (10 mg total) by mouth daily. 07/06/22  Yes Rhetta Mura, MD  sevelamer carbonate (RENVELA) 800 MG tablet Take 2 tablets (1,600 mg total) by mouth 3 (three) times daily with meals. Patient taking differently: Take 2,400 mg by mouth 3 (three) times daily with meals. 800 mg with snacks 11/11/18  Yes Hall, Carole N, DO  traZODone (DESYREL) 100 MG tablet Take 100 mg by mouth at bedtime. 08/18/22  Yes [provider]   Current Facility-Administered Medications  Medication Dose Route Frequency Provider Last Rate Last Admin   0.9 %  sodium chloride infusion  250 mL Intravenous PRN Cephus Shelling, MD       ARIPiprazole (ABILIFY) tablet 10 mg  10 mg Oral q morning Rocky Morel, DO   10 mg at 09/13/23 0943   cycloSPORINE modified (GENGRAF) capsule 25 mg  25 mg Oral BID Rocky Morel, DO   25 mg at 09/13/23 1610   And   cycloSPORINE modified (GENGRAF) capsule 100 mg  100 mg Oral BID Rocky Morel, DO   100 mg at 09/13/23 0941   dextrose 5 % in lactated ringers infusion   Intravenous Continuous Carmina Miller, DO 75 mL/hr at 09/13/23 1215 New Bag at  09/13/23 1215   divalproex (DEPAKOTE) DR tablet 250 mg  250  mg Oral q morning Rocky Morel, DO   250 mg at 09/13/23 3664   And   divalproex (DEPAKOTE) DR tablet 500 mg  500 mg Oral QHS Rocky Morel, DO   500 mg at 09/12/23 2356   DULoxetine (CYMBALTA) DR capsule 60 mg  60 mg Oral Daily Rocky Morel, DO   60 mg at 09/13/23 4034   hydrocortisone (ANUSOL-HC) 2.5 % rectal cream 1 Application  1 Application Topical PRN Rocky Morel, DO       hydrOXYzine (ATARAX) tablet 25 mg  25 mg Oral BID Rocky Morel, DO   25 mg at 09/13/23 7425   insulin aspart (novoLOG) injection 0-6 Units  0-6 Units Subcutaneous Q4H Rocky Morel, DO       midodrine (PROAMATINE) tablet 10 mg  10 mg Oral TID WC Rocky Morel, DO   10 mg at 09/13/23 1222   mycophenolate (MYFORTIC) EC tablet 720 mg  720 mg Oral BID Rocky Morel, DO   720 mg at 09/13/23 0939   piperacillin-tazobactam (ZOSYN) IVPB 2.25 g  2.25 g Intravenous Q8H Arabella Merles, RPH   Stopped at 09/13/23 1054   pregabalin (LYRICA) capsule 50 mg  50 mg Oral BID Rocky Morel, DO   50 mg at 09/13/23 9563   rosuvastatin (CRESTOR) tablet 10 mg  10 mg Oral Daily Rocky Morel, DO   10 mg at 09/13/23 0947   sevelamer carbonate (RENVELA) tablet 2,400 mg  2,400 mg Oral TID WC Rocky Morel, DO   2,400 mg at 09/13/23 1222   sodium chloride flush (NS) 0.9 % injection 3 mL  3 mL Intravenous Q12H Cephus Shelling, MD       traZODone (DESYREL) tablet 100 mg  100 mg Oral QHS Rocky Morel, DO   100 mg at 09/12/23 2359   vancomycin variable dose per unstable renal function (pharmacist dosing)   Does not apply See admin instructions Arabella Merles, [facility]       Current Outpatient Medications  Medication Sig Dispense Refill   acetaminophen (TYLENOL) 500 MG tablet Take 1,000 mg by mouth 2 (two) times daily as needed for moderate pain (pain score 4-6).     ARIPiprazole (ABILIFY) 10 MG tablet Take 10 mg by mouth every morning.     cycloSPORINE modified  (NEORAL) 100 MG capsule Take 100 mg by mouth See admin instructions. Take with 25 mg for a total of 125 mg twice daily     cycloSPORINE modified 25 MG CAPS Take 25 mg by mouth See admin instructions. Take with 100 mg for a total of 125 mg twice daily     divalproex (DEPAKOTE) 125 MG DR tablet Take 250 mg by mouth in morning and 500 mg by mouth at night (Patient taking differently: Take 250-500 mg by mouth See admin instructions. Take 250 mg by mouth in morning and 500 mg by mouth at night) 180 tablet 11   DULoxetine (CYMBALTA) 60 MG capsule Take 1 capsule (60 mg total) by mouth daily. 90 capsule 3   ELIQUIS 2.5 MG TABS tablet Take 1 tablet (2.5 mg total) by mouth 2 (two) times daily. 60 tablet    hydrocortisone (ANUSOL-HC) 2.5 % rectal cream Apply 1 Application topically as needed for hemorrhoids or anal itching.     hydrOXYzine (ATARAX) 25 MG tablet Take 25 mg by mouth 2 (two) times daily.     ibuprofen (ADVIL) 200 MG tablet Take 400 mg by mouth as needed for moderate pain (pain score 4-6) or mild pain (pain  score 1-3).     midodrine (PROAMATINE) 10 MG tablet Take 1 tablet (10 mg total) by mouth 3 (three) times daily. (Patient taking differently: Take 10 mg by mouth every Monday, Wednesday, and Friday with hemodialysis. Take 10 mg twice a day on non-dialysis days)     mycophenolate (MYFORTIC) 180 MG EC tablet Take 720 mg by mouth 2 (two) times daily.     naloxone (NARCAN) nasal spray 4 mg/0.1 mL Place 0.4 mg into the nose once as needed (opioid overdose).     oxyCODONE-acetaminophen (PERCOCET) 5-325 MG tablet Take 1 tablet by mouth every 6 (six) hours as needed for severe pain (pain score 7-10). Do not take with other opiate pain medication 20 tablet 0   pregabalin (LYRICA) 50 MG capsule Take 50 mg by mouth 2 (two) times daily.     rosuvastatin (CRESTOR) 10 MG tablet Take 1 tablet (10 mg total) by mouth daily. 30 tablet 1   sevelamer carbonate (RENVELA) 800 MG tablet Take 2 tablets (1,600 mg total) by  mouth 3 (three) times daily with meals. (Patient taking differently: Take 2,400 mg by mouth 3 (three) times daily with meals. 800 mg with snacks) 120 tablet 0   traZODone (DESYREL) 100 MG tablet Take 100 mg by mouth at bedtime.     Labs: Basic Metabolic Panel: Recent Labs  Lab 09/12/23 1700 09/12/23 2350  NA 135 134*  K 4.1 4.6  CL 95* 96*  CO2 24 24  GLUCOSE 111* 102*  BUN 27* 31*  CREATININE 7.33* 7.90*  CALCIUM 9.8 9.4  PHOS  --  6.8*   Liver Function Tests: Recent Labs  Lab 09/12/23 1700 09/12/23 2350  AST 10*  --   ALT 8  --   ALKPHOS 88  --   BILITOT 1.0  --   PROT 6.6  --   ALBUMIN 3.1* 2.8*   CBC: Recent Labs  Lab 09/12/23 1700 09/12/23 2350  WBC 7.1 6.3  NEUTROABS 5.0 4.5  HGB 11.3* 11.0*  HCT 37.5* 35.0*  MCV 99.7 96.4  PLT 217 208   Dialysis Orders:  MWF - [facility] 4hr, 500/A2.0, EDW 118.7kg, 2K/2Ca, UFP #4, TDC, no heparin - Hectoral IV q HD - ESA on hold, last Hgb 11 - PO sensipar 120mg  at bedtime  Assessment/Plan:  L foot gangrene: Blood Cx collected, started on IV abx. MRI pending. VVS consult pending, presume will need amputation of some kind.  ESRD:  Usual MWF schedule - due for HD today, will order - may be tomorrow AM d/t high census.  Hypotension/volume: Chronic hypotension, UF as tolerated  Anemia: Hgb 11 - no ESA for now  Metabolic bone disease: CorrCa high - hold VDRA, Phos high - resume home binders (sevelamer)  Hx heart transplant: Continue home IS regimen  Ozzie Hoyle, PA-C 09/13/2023, 1:04 PM  [employer]

## 2023-09-14 DIAGNOSIS — Z992 Dependence on renal dialysis: Secondary | ICD-10-CM | POA: Diagnosis not present

## 2023-09-14 DIAGNOSIS — N186 End stage renal disease: Secondary | ICD-10-CM

## 2023-09-14 DIAGNOSIS — E11628 Type 2 diabetes mellitus with other skin complications: Secondary | ICD-10-CM

## 2023-09-14 DIAGNOSIS — I96 Gangrene, not elsewhere classified: Secondary | ICD-10-CM

## 2023-09-14 LAB — RENAL FUNCTION PANEL
Albumin: 2.3 g/dL — ABNORMAL LOW (ref 3.5–5.0)
Anion gap: 8 (ref 5–15)
BUN: 21 mg/dL (ref 8–23)
CO2: 29 mmol/L (ref 22–32)
Calcium: 9.1 mg/dL (ref 8.9–10.3)
Chloride: 98 mmol/L (ref 98–111)
Creatinine, Ser: 5.99 mg/dL — ABNORMAL HIGH (ref 0.61–1.24)
GFR, Estimated: 10 mL/min — ABNORMAL LOW (ref >60)
Glucose, Bld: 78 mg/dL (ref 70–99)
Phosphorus: 5.4 mg/dL — ABNORMAL HIGH (ref 2.5–4.6)
Potassium: 4.3 mmol/L (ref 3.5–5.1)
Sodium: 135 mmol/L (ref 135–145)

## 2023-09-14 LAB — GLUCOSE, CAPILLARY
Glucose-Capillary: 73 mg/dL (ref 70–99)
Glucose-Capillary: 82 mg/dL (ref 70–99)
Glucose-Capillary: 71 mg/dL (ref 70–99)
Glucose-Capillary: 85 mg/dL (ref 70–99)

## 2023-09-14 LAB — CBC
HCT: 31.7 % — ABNORMAL LOW (ref 39.0–52.0)
Hemoglobin: 10.3 g/dL — ABNORMAL LOW (ref 13.0–17.0)
MCH: 30.7 pg (ref 26.0–34.0)
MCHC: 32.5 g/dL (ref 30.0–36.0)
MCV: 94.3 fL (ref 80.0–100.0)
Platelets: 187 10*3/uL (ref 150–400)
RBC: 3.36 MIL/uL — ABNORMAL LOW (ref 4.22–5.81)
RDW: 16 % — ABNORMAL HIGH (ref 11.5–15.5)
WBC: 6.9 10*3/uL (ref 4.0–10.5)
nRBC: 0 % (ref 0.0–0.2)

## 2023-09-14 LAB — HEPATITIS B SURFACE ANTIBODY, QUANTITATIVE: Hep B S AB Quant (Post): 70.8 m[IU]/mL

## 2023-09-14 MED ORDER — INSULIN ASPART 100 UNIT/ML IJ SOLN: 0.0000 [IU] | Freq: Three times a day (TID) | INTRAMUSCULAR | Status: DC

## 2023-09-14 MED ORDER — OXYCODONE HCL 5 MG PO TABS
5.0000 mg | ORAL_TABLET | ORAL | Status: DC | PRN
  Administered 2023-09-14: 5 mg via ORAL
  Filled 2023-09-14: qty 1

## 2023-09-14 MED ORDER — ACETAMINOPHEN 500 MG PO TABS
1000.0000 mg | ORAL_TABLET | Freq: Once | ORAL | Status: AC
  Administered 2023-09-14: 1000 mg via ORAL
  Filled 2023-09-14: qty 2

## 2023-09-14 MED ORDER — ACETAMINOPHEN 500 MG PO TABS
1000.0000 mg | ORAL_TABLET | Freq: Four times a day (QID) | ORAL | Status: DC | PRN
  Administered 2023-09-14: 1000 mg via ORAL
  Filled 2023-09-14: qty 2

## 2023-09-14 MED ORDER — ROSUVASTATIN CALCIUM 20 MG PO TABS
40.0000 mg | ORAL_TABLET | Freq: Every day | ORAL | Status: DC
  Administered 2023-09-14: 40 mg via ORAL
  Filled 2023-09-14: qty 2

## 2023-09-14 NOTE — Progress Notes (Signed)
[facility]  Subjective  -no complaints   Objective (!) 141/127 86 (!) 97.3 F (36.3 C) (Oral) 19 (!) 88%  Intake/Output Summary (Last 24 hours) at 09/14/2023 1004 Last data filed at 09/14/2023 0600 Gross per 24 hour  Intake 533.64 ml  Output 1600 ml  Net -1066.36 ml    Left DP palpable Ischemic changes to the left first second third toes  Laboratory Lab Results: Recent Labs    09/12/23 2350 09/14/23 0503  WBC 6.3 6.9  HGB 11.0* 10.3*  HCT 35.0* 31.7*  PLT 208 187   BMET Recent Labs    09/12/23 2350 09/14/23 0503  NA 134* 135  K 4.6 4.3  CL 96* 98  CO2 24 29  GLUCOSE 102* 78  BUN 31* 21  CREATININE 7.90* 5.99*  CALCIUM 9.4 9.1    COAG Lab Results  Component Value Date   INR 1.2 09/06/2023   INR 1.8 (H) 11/29/2022   INR 1.2 06/24/2022   No results found for: "PTT"  Assessment/Planning:  63 year old male seen by Dr. Lenell Antu yesterday with ischemic changes to the left toes.  He has a palpable left DP pulse.  Dr. Lenell Antu offered left TMA.  He still considering his options.  Patient is followed by podiatry by Dr. Allena Katz.  I think it would be worthwhile getting a second opinion regarding management of the soft tissue.  Cephus Shelling 09/14/2023 10:04 AM --

## 2023-09-14 NOTE — Progress Notes (Signed)
Overview: [name] is a 63 y.o.male with history of atrial fibrillation on Eliquis, CVA, hyperlipidemia and hypertension who presented to the ED from his PCP office due to concerns for worsening foot gangrene secondary to worsening microvascular disease.   Overnight:NAEON  Subjective: Having pain in left foot. No other acute concerns endorsed.   Objective:  Vital signs in last 24 hours: Vitals:   09/13/23 2127 09/13/23 2130 09/13/23 2200 09/14/23 0544  BP: 101/61 101/61 (!) 110/45 93/64  Pulse: 82 84 83 76  Resp: 17  18 20   Temp:   98.1 F (36.7 C) 97.7 F (36.5 C)  TempSrc:   Oral Oral  SpO2: 99% 97% 98% 100%  Weight:      Height:       Supplemental O2: Room Air Last BM Date : 09/12/23 SpO2: 100 % Filed Weights   09/12/23 2300 09/13/23 0306 09/13/23 1802  Weight: 118 kg 96.6 kg 96.6 kg    Intake/Output Summary (Last 24 hours) at 09/14/2023 0610 Last data filed at 09/13/2023 2130 Gross per 24 hour  Intake 483.64 ml  Output 1600 ml  Net -1116.36 ml   Net IO Since Admission: -470.01 mL [09/14/23 0610]  Physical Exam General: NAD HENT: NCAT Lungs: CTAB  Cardiovascular: RRR Abdomen: No TTP Skin: Darkened skin on left foot Neuro: alert and oriented x4 Psych: normal mood  Diagnostics    Latest Ref Rng & Units 09/14/2023    5:03 AM 09/12/2023   11:50 PM 09/12/2023    5:00 PM  CBC  WBC 4.0 - 10.5 K/uL 6.9  6.3  7.1   Hemoglobin 13.0 - 17.0 g/dL 95.6  21.3  08.6   Hematocrit 39.0 - 52.0 % 31.7  35.0  37.5   Platelets 150 - 400 K/uL 187  208  217        Latest Ref Rng & Units 09/14/2023    5:03 AM 09/12/2023   11:50 PM 09/12/2023    5:00 PM  BMP  Glucose 70 - 99 mg/dL 78  578  469   BUN 8 - 23 mg/dL 21  31  27    Creatinine 0.61 - 1.24 mg/dL 6.29  5.28  4.13   Sodium 135 - 145 mmol/L 135  134  135   Potassium 3.5 - 5.1 mmol/L 4.3  4.6  4.1   Chloride 98 - 111 mmol/L 98  96  95   CO2 22 - 32 mmol/L 29  24  24    Calcium 8.9 - 10.3 mg/dL 9.1  9.4  9.8       Assessment/Plan: Principal Problem:   Gangrene of foot (HCC) Active Problems:   Heart transplant recipient [facility])   Secondary hypertension  #Dry Gangrene of Left Foot Suspect primarily secondary to worsening microvascular disease. Will continue abx Zosyn and Vanc for now. Vascular surgery consulted, appreciate their recommendations. Plan for left transmetatarsal amputation early next week. Will reach out to vascular team to see if it is on Monday vs later in the week to see if we can restart pt's eliquis (see afib). Will hold ASA for the above reason.   #ESRD on HD MWF - Getting HD TThSat.   #History of CVA - Increase crestor to 40 mg every day. LDL goal <70 given hx of CVA and PAD.    #History of anxiety and depression #Hx of Dementia  -Continue on home hydroxyzine, Abilify and Depakote    #History of A-fib -At home on Eliquis. Currently in NSR. Will  discuss with vascular team on tentative timing of surgery. Currently eliqius is held.    #Hx of Heart transplant  - Resume immunosuppressants. Cyclosporine and mycophenolate    #Hx of recurrent hemorrhoids  - Resume home rectal hydrocortisone cream   #Hx of peripheral neuropathy - Resume home Pregabalin    Diet: Heart Healthy IVF: None VTE: SCDs Code: Full PT/OT recs: Pending Prior to Admission Living Arrangement: Home Anticipated Discharge Location: Home Barriers to Discharge: Medical Stability Dispo: Anticipated discharge in approximately 3-4 day(s).   Gwenevere Abbot, MD Eligha Bridegroom. [facility] Internal Medicine Residency, PGY-3  Pager: [phone] After 5 pm and on weekends: Please call the on-call pager

## 2023-09-14 NOTE — Progress Notes (Signed)
[facility] Progress Note   Subjective:   Seen in room - feels ok, + foot pain. No CP/dyspnea. VVS following, looks like plan will be L TMA soon.  Objective Vitals:   09/13/23 2130 09/13/23 2200 09/14/23 0544 09/14/23 0811  BP: 101/61 (!) 110/45 93/64 (!) 141/127  Pulse: 84 83 76 86  Resp:  18 20 19   Temp:  98.1 F (36.7 C) 97.7 F (36.5 C) (!) 97.3 F (36.3 C)  TempSrc:  Oral Oral Oral  SpO2: 97% 98% 100% (!) 88%  Weight:      Height:       Physical Exam General: Well appearing man, NAD. Room air Heart: RRR; no murmur Lungs: CTA anteriorly Abdomen: soft Extremities: 2+ BLE chronic edema, L foot wound not examined today Dialysis Access: [facility], [place] in R chest  Additional Objective Labs: Basic Metabolic Panel: Recent Labs  Lab 09/12/23 1700 09/12/23 2350 09/14/23 0503  NA 135 134* 135  K 4.1 4.6 4.3  CL 95* 96* 98  CO2 24 24 29   GLUCOSE 111* 102* 78  BUN 27* 31* 21  CREATININE 7.33* 7.90* 5.99*  CALCIUM 9.8 9.4 9.1  PHOS  --  6.8* 5.4*   Liver Function Tests: Recent Labs  Lab 09/12/23 1700 09/12/23 2350 09/14/23 0503  AST 10*  --   --   ALT 8  --   --   ALKPHOS 88  --   --   BILITOT 1.0  --   --   PROT 6.6  --   --   ALBUMIN 3.1* 2.8* 2.3*   CBC: Recent Labs  Lab 09/12/23 1700 09/12/23 2350 09/14/23 0503  WBC 7.1 6.3 6.9  NEUTROABS 5.0 4.5  --   HGB 11.3* 11.0* 10.3*  HCT 37.5* 35.0* 31.7*  MCV 99.7 96.4 94.3  PLT 217 208 187   Blood Culture    Component Value Date/Time   SDES BLOOD RIGHT ARM 09/12/2023 1700   SPECREQUEST  09/12/2023 1700    BOTTLES DRAWN AEROBIC AND ANAEROBIC Blood Culture results may not be optimal due to an inadequate volume of blood received in culture bottles   CULT  09/12/2023 1700    NO GROWTH 2 DAYS Performed at [facility], [address] [place], [place] [address]    REPTSTATUS PENDING 09/12/2023 1700   Studies/Results: MRI Left foot without contrast Result Date: 09/13/2023 CLINICAL DATA:   Osteonecrosis suspected, foot. EXAM: MRI OF THE LEFT FOOT WITHOUT CONTRAST TECHNIQUE: Multiplanar, multisequence MR imaging of the left forefoot was performed. No intravenous contrast was administered. COMPARISON:  Left foot radiographs 09/06/2023 FINDINGS: Despite efforts by the technologist and patient, moderate to high-grade motion artifact is present on today's exam and could not be eliminated. This reduces exam sensitivity and specificity. There is metaphysis of the artifact from a screw within the proximal and middle phalanges of the fourth toe. Bones/Joint/Cartilage Within the limitation of motion artifact, there is mild-to-moderate midfoot intertarsal and tarsometatarsal cartilage thinning. The prior resection of the middle phalanx and distal aspect of the proximal phalanx of second toe seen on recent radiographs is not well visualized due to motion artifact. There is second metatarsophalangeal cartilage thinning with mild subchondral marrow edema (sagittal series 4, image 16 and coronal series 12, image 13). There is diffuse marrow edema throughout the third metatarsal head (sagittal image 21, coronal image 9, axial image 19). There is curvilinear decreased T1 and surrounding increased T2 signal within the mid to lateral aspect of the fourth  metatarsal head articular surface (coronal image 9, sagittal image 27, axial image 21). Motion artifact markedly limits evaluation of these second through fourth tarsometatarsal joints. This marrow edema is compatible with degenerative change. It is difficult to exclude early osteonecrosis given the curvilinear signal within the fourth and possibly third metatarsal heads, however this curvilinear edema also may be posttraumatic. There does appear to be an acute to subacute fracture of the medial base of the proximal phalanx of the fourth toe on prior radiographs, although motion artifact limits evaluation of this region. There is likely fluid sensitive signal along this  nonunited fracture on coronal series 12, image 9. Ligaments The Lisfranc ligament complex is intact. Muscles and Tendons Diffuse nonspecific muscle edema throughout the plantar greater than dorsal midfoot and forefoot, greatest within the abductor hallucis and, quadratus plantae, flexor digitorum brevis, and abductor digiti minimi muscles. Soft tissues Please note the plantar soft tissue ulceration on recent 09/06/2023 radiographs was at the level of the posterior cuboid. This appears to be imaged on the current MRI, however no significant ulceration is seen in this region. There is mild decreased T1 and decreased T2 signal scarring in the posterior most coronal images (coronal series 8, image 66/66, however this is at the level of the plantar calcaneus and does not appear to be the same region. There is fluid bright signal indicating thinning of the skin and subcutaneous fat lateral to the cuboid in a region measuring up to approximately 3 cm in craniocaudal dimension (coronal series 8, image 56). This is lateral to the distal peroneus brevis tendon. No definite marrow edema or cortical erosion is seen within the adjacent cuboid, within the limitation of motion artifact. High-grade subcutaneous fat edema and swelling seen throughout the ankle and midfoot/forefoot. IMPRESSION: 1. Moderate to high-grade motion artifact technically limits this examination. 2. Likely corresponding to the soft tissue lucency within the plantar lateral midfoot on recent 09/06/2023 radiographs, fluid bright signal indicating thinning of the skin and subcutaneous fat is seen lateral to the cuboid in a region measuring up to approximately 3 cm in craniocaudal dimension. No definite marrow edema or cortical erosion is seen within the adjacent cuboid to indicate acute osteomyelitis, within the limitation of motion artifact. 3. There does appear to be an acute to subacute fracture of the medial base of the proximal phalanx of the fourth toe on  09/05/2022 radiographs, although motion artifact limits evaluation of this region on MRI. 4. There is diffuse marrow edema throughout the third metatarsal head. There is curvilinear decreased T1 and surrounding edema within the mid to lateral aspect of the fourth metatarsal head articular surface. Motion artifact markedly limits evaluation of these second through fourth tarsometatarsal joints. This marrow edema is compatible with degenerative change. It is difficult to exclude early osteonecrosis given the curvilinear signal within the fourth and possibly third metatarsal heads, however this curvilinear edema also may be posttraumatic. 5. High-grade subcutaneous fat edema and swelling throughout the ankle and midfoot/forefoot. Electronically Signed   By: Neita Garnet M.D.   On: 09/13/2023 10:13   Medications:  piperacillin-tazobactam (ZOSYN)  IV 2.25 g (09/14/23 0805)    ARIPiprazole  10 mg Oral q morning   Chlorhexidine Gluconate Cloth  6 each Topical Q0600   cycloSPORINE modified  25 mg Oral BID   And   cycloSPORINE modified  100 mg Oral BID   dextrose  1 ampule Intravenous Once   divalproex  250 mg Oral q morning   And   divalproex  500 mg Oral QHS   DULoxetine  60 mg Oral Daily   hydrOXYzine  25 mg Oral BID   insulin aspart  0-6 Units Subcutaneous TID AC & HS   midodrine  10 mg Oral TID WC   mycophenolate  720 mg Oral BID   pregabalin  50 mg Oral BID   rosuvastatin  10 mg Oral Daily   sevelamer carbonate  2,400 mg Oral TID WC   traZODone  100 mg Oral QHS   vancomycin variable dose per unstable renal function (pharmacist dosing)   Does not apply See admin instructions    Dialysis Orders MWF - [facility] 4hr, 500/A2.0, EDW 118.7kg, 2K/2Ca, UFP #4, TDC, no heparin - Hectoral IV q HD - ESA on hold, last Hgb 11 - PO sensipar 120mg  at bedtime   Assessment/Plan:  L foot gangrene: Blood Cx negative so far, on Vanc/Zosyn. MRI with ?osteo of 3rd toe. VVS following, felt to be optimized  vascularly, plan appears to be TMA in the near future.   ESRD:  Usual MWF schedule - next HD Monday 1/20.  Hypotension/volume: Chronic hypotension on midodrine TID, UF as tolerated  Anemia: Hgb 10.3 - will resume ESA if Hgb drops lower.  Metabolic bone disease: CorrCa high - holding VDRA, Phos good, continue home binders (sevelamer)  Hx heart transplant: Continue home IS regimen  Hx CVA  A-fib: Eliquis on hold   Ozzie Hoyle, PA-C 09/14/2023, 9:05 AM  [employer]

## 2023-09-14 NOTE — Plan of Care (Signed)
  Problem: Education: Goal: Ability to describe self-care measures that may prevent or decrease complications (Diabetes Survival Skills Education) will improve Outcome: Progressing Goal: Individualized Educational Video(s) Outcome: Progressing   Problem: Coping: Goal: Ability to adjust to condition or change in health will improve Outcome: Progressing   Problem: Fluid Volume: Goal: Ability to maintain a balanced intake and output will improve Outcome: Progressing   Problem: Health Behavior/Discharge Planning: Goal: Ability to identify and utilize available resources and services will improve Outcome: Progressing Goal: Ability to manage health-related needs will improve Outcome: Progressing   Problem: Metabolic: Goal: Ability to maintain appropriate glucose levels will improve Outcome: Progressing   Problem: Nutritional: Goal: Progress toward achieving an optimal weight will improve Outcome: Progressing  Decreased PO intake. Maintaining blood sugar. Being evaluated for L TMA-evaluation ongoing.

## 2023-09-15 DIAGNOSIS — E11628 Type 2 diabetes mellitus with other skin complications: Secondary | ICD-10-CM | POA: Diagnosis not present

## 2023-09-15 DIAGNOSIS — I96 Gangrene, not elsewhere classified: Secondary | ICD-10-CM | POA: Diagnosis not present

## 2023-09-15 DIAGNOSIS — Z992 Dependence on renal dialysis: Secondary | ICD-10-CM | POA: Diagnosis not present

## 2023-09-15 DIAGNOSIS — L089 Local infection of the skin and subcutaneous tissue, unspecified: Secondary | ICD-10-CM | POA: Diagnosis not present

## 2023-09-15 DIAGNOSIS — N186 End stage renal disease: Secondary | ICD-10-CM | POA: Diagnosis not present

## 2023-09-15 LAB — RENAL FUNCTION PANEL
Albumin: 2.3 g/dL — ABNORMAL LOW (ref 3.5–5.0)
Anion gap: 14 (ref 5–15)
BUN: 30 mg/dL — ABNORMAL HIGH (ref 8–23)
CO2: 24 mmol/L (ref 22–32)
Calcium: 9 mg/dL (ref 8.9–10.3)
Chloride: 96 mmol/L — ABNORMAL LOW (ref 98–111)
Creatinine, Ser: 7.89 mg/dL — ABNORMAL HIGH (ref 0.61–1.24)
GFR, Estimated: 7 mL/min — ABNORMAL LOW (ref >60)
Glucose, Bld: 70 mg/dL (ref 70–99)
Phosphorus: 6.2 mg/dL — ABNORMAL HIGH (ref 2.5–4.6)
Potassium: 4.8 mmol/L (ref 3.5–5.1)
Sodium: 134 mmol/L — ABNORMAL LOW (ref 135–145)

## 2023-09-15 LAB — CBC
HCT: 32.5 % — ABNORMAL LOW (ref 39.0–52.0)
Hemoglobin: 10.4 g/dL — ABNORMAL LOW (ref 13.0–17.0)
MCH: 30.5 pg (ref 26.0–34.0)
MCHC: 32 g/dL (ref 30.0–36.0)
MCV: 95.3 fL (ref 80.0–100.0)
Platelets: 185 10*3/uL (ref 150–400)
RBC: 3.41 MIL/uL — ABNORMAL LOW (ref 4.22–5.81)
RDW: 16 % — ABNORMAL HIGH (ref 11.5–15.5)
WBC: 5.5 10*3/uL (ref 4.0–10.5)
nRBC: 0 % (ref 0.0–0.2)

## 2023-09-15 LAB — GLUCOSE, CAPILLARY
Glucose-Capillary: 62 mg/dL — ABNORMAL LOW (ref 70–99)
Glucose-Capillary: 74 mg/dL (ref 70–99)
Glucose-Capillary: 85 mg/dL (ref 70–99)
Glucose-Capillary: 74 mg/dL (ref 70–99)

## 2023-09-15 MED ORDER — CINACALCET HCL 30 MG PO TABS
120.0000 mg | ORAL_TABLET | Freq: Every day | ORAL | Status: DC
  Administered 2023-09-15 – 2023-09-16 (×2): 120 mg via ORAL
  Filled 2023-09-15 (×2): qty 4

## 2023-09-15 MED ORDER — ROSUVASTATIN CALCIUM 5 MG PO TABS
10.0000 mg | ORAL_TABLET | Freq: Every day | ORAL | Status: DC
  Administered 2023-09-15: 10 mg via ORAL
  Filled 2023-09-15: qty 2

## 2023-09-15 MED ORDER — VANCOMYCIN HCL IN DEXTROSE 1-5 GM/200ML-% IV SOLN
1000.0000 mg | INTRAVENOUS | Status: DC
  Administered 2023-09-16: 1000 mg via INTRAVENOUS
  Filled 2023-09-15: qty 200

## 2023-09-15 MED ORDER — ATORVASTATIN CALCIUM 40 MG PO TABS
40.0000 mg | ORAL_TABLET | Freq: Every day | ORAL | Status: DC
Start: 2023-09-15 — End: 2023-09-16
  Administered 2023-09-15 – 2023-09-16 (×2): 40 mg via ORAL
  Filled 2023-09-15 (×2): qty 1

## 2023-09-15 MED ORDER — HYDROMORPHONE HCL 2 MG PO TABS: 2.0000 mg | ORAL_TABLET | ORAL | Status: DC | PRN

## 2023-09-15 NOTE — Progress Notes (Signed)
Pharmacy Antibiotic Note  [name] is a 63 y.o. male admitted on 09/12/2023 with foot discoloration and concerns for  wound infection.  Pharmacy has been consulted for zosyn/vancomycin dosing.PMH includes ESRD (hemodialysis). Patient MWF HD at o/p center. Next HD schedule for Monday 1/20. Possible TMA early next week.   -WBC 7.1, afebrile, lactate 1.9 -MRI of left foot pending -Ceftriaxone x1 given in ED  Plan: -Zosyn 2.25g IV every 8 hours -Vancomycin 1000mg  IV MWF -levels as needed -Follow up signs of clinical improvement, LOT, de-escalation of antibiotics   Height: 6\' 7"  (200.7 cm) Weight: 123.7 kg (272 lb 11.3 oz) IBW/kg (Calculated) : 93.7 Temp (24hrs), Avg:97.8 F (36.6 C), Min:97.3 F (36.3 C), Max:98.4 F (36.9 C)  Recent Labs  Lab 09/12/23 1700 09/12/23 1707 09/12/23 2350 09/14/23 0503  WBC 7.1  --  6.3 6.9  CREATININE 7.33*  --  7.90* 5.99*  LATICACIDVEN  --  1.9  --   --     Estimated Creatinine Clearance: 19.1 mL/min (A) (by C-G formula based on SCr of 5.99 mg/dL (H)).    Antimicrobials this admission: Vancomycin 1/17 >>  Zosyn 1/17 >>   Microbiology results: 1/16 BCx: NGTD  [name] A. Jeanella Craze, PharmD, BCPS, FNKF Clinical Pharmacist [place] Please utilize Amion for appropriate phone number to reach the unit pharmacist [facility])  09/15/2023 8:11 AM

## 2023-09-15 NOTE — Consult Note (Signed)
PODIATRY CONSULTATION  NAME [name] MRN [id number] DOB 02-04-1961 DOA 09/12/2023   Reason for consult:  Chief Complaint  Patient presents with   Recurrent Skin Infections    Consulting physician: Gwenevere Abbot MD, [facility]  History of present illness: 63 y.o. male admitted for worsening infection to the digits of the left foot.  Patient's family has noticed ischemic changes to the toes and discoloration of the past 3 weeks.  Upon admission transmetatarsal amputation was offered for the patient from vascular.  Podiatry was requested to consult on the patient for second opinion   Past Medical History:  Diagnosis Date   Anxiety    Arthritis    Atrial fibrillation (HCC)    CHF (congestive heart failure), NYHA class III (HCC)    s/p heart transplant   Chronic kidney disease    end stage   Chronic systolic dysfunction of left ventricle    CVA (cerebral infarction)    Dementia (HCC)    Depression    Diabetes mellitus    PMH; Prior to heart transplant   Family history of coronary artery disease    in both parents   GERD (gastroesophageal reflux disease)    Hyperlipidemia    Hypertension    Left bundle branch block    Morbid obesity (HCC)    status post lap band   Myocardial infarction [facility])    prior to heart transplant   Nonischemic cardiomyopathy (HCC)    prior to heart transplant   Obesity (BMI 30-39.9)    Obstructive sleep apnea    no longer needs CPAP after heart transplant per pt   Premature ventricular contractions    Prostate cancer (HCC)    SOB (shortness of breath)        Latest Ref Rng & Units 09/15/2023    7:42 AM 09/14/2023    5:03 AM 09/12/2023   11:50 PM  CBC  WBC 4.0 - 10.5 K/uL 5.5  6.9  6.3   Hemoglobin 13.0 - 17.0 g/dL 47.8  29.5  62.1   Hematocrit 39.0 - 52.0 % 32.5  31.7  35.0   Platelets 150 - 400 K/uL 185  187  208        Latest Ref Rng & Units 09/15/2023    7:42 AM 09/14/2023    5:03 AM 09/12/2023   11:50 PM  BMP  Glucose  70 - 99 mg/dL 70  78  308   BUN 8 - 23 mg/dL 30  21  31    Creatinine 0.61 - 1.24 mg/dL 6.57  8.46  9.62   Sodium 135 - 145 mmol/L 134  135  134   Potassium 3.5 - 5.1 mmol/L 4.8  4.3  4.6   Chloride 98 - 111 mmol/L 96  98  96   CO2 22 - 32 mmol/L 24  29  24    Calcium 8.9 - 10.3 mg/dL 9.0  9.1  9.4       9/52/8413   Physical Exam: General: The patient is alert and oriented x3 in no acute distress.   Dermatology: Skin is warm, dry and supple bilateral lower extremities with exception of the distal portion of the left foot encompassing the toes.  There are superficial ulcers with adhered eschar and some slight purulence diffusely throughout the toes.  Slight maceration noted between the fourth and fifth digit of the left foot as well with superficial ulcer medial aspect of the left fifth toe  Vascular: Palpable DP pulses left.  Skin is warm to touch through the left midfoot VAS [place] ABI WITH/WO TBI 09/06/2023 ABI Findings:  +---------+------------------+-----+---------+----------------------+  Right   Rt Pressure (mmHg)IndexWaveform Comment                 +---------+------------------+-----+---------+----------------------+  Brachial 101                    biphasic HX of HD access in RUE  +---------+------------------+-----+---------+----------------------+  PTA                            triphasicnon compressible        +---------+------------------+-----+---------+----------------------+  DP                             triphasicnon compressible        +---------+------------------+-----+---------+----------------------+  Great Toe73                0.67 Normal                           +---------+------------------+-----+---------+----------------------+   +---------+------------------+-----+---------+-------+  Left    Lt Pressure (mmHg)IndexWaveform Comment  +---------+------------------+-----+---------+-------+  Brachial 109                     triphasic         +---------+------------------+-----+---------+-------+  PTA                            triphasic         +---------+------------------+-----+---------+-------+  DP                             triphasic         +---------+------------------+-----+---------+-------+  Great Toe75                0.69 Normal            +---------+------------------+-----+---------+-------+  Arterial wall calcification precludes accurate ankle pressures and ABIs.    Summary:  Right: Resting right ankle-brachial index indicates noncompressible right lower extremity arteries. The right toe-brachial index is mildly reduced.  Left: Resting left ankle-brachial index indicates noncompressible left lower extremity arteries. The left toe-brachial index is mildly reduced.   Neurological: Light touch and protective threshold diminished bilateral  Musculoskeletal Exam: Patient minimally ambulatory for transition purposes only.  MRI Left foot without contrast 09/12/2023 Essentially inconclusive and noncontributory IMPRESSION: 1. Moderate to high-grade motion artifact technically limits this examination. 2. Likely corresponding to the soft tissue lucency within the plantar lateral midfoot on recent 09/06/2023 radiographs, fluid bright signal indicating thinning of the skin and subcutaneous fat is seen lateral to the cuboid in a region measuring up to approximately 3 cm in craniocaudal dimension. No definite marrow edema or cortical erosion is seen within the adjacent cuboid to indicate acute osteomyelitis, within the limitation of motion artifact. 3. There does appear to be an acute to subacute fracture of the medial base of the proximal phalanx of the fourth toe on 09/05/2022 radiographs, although motion artifact limits evaluation of this region on MRI. 4. There is diffuse marrow edema throughout the third metatarsal head. There is curvilinear decreased T1 and surrounding edema  within the mid to lateral aspect of the fourth metatarsal head articular surface. Motion artifact markedly limits evaluation of these second through  fourth tarsometatarsal joints. This marrow edema is compatible with degenerative change. It is difficult to exclude early osteonecrosis given the curvilinear signal within the fourth and possibly third metatarsal heads, however this curvilinear edema also may be posttraumatic. 5. High-grade subcutaneous fat edema and swelling throughout the ankle and midfoot/forefoot.  ASSESSMENT/PLAN OF CARE Ischemic ulcers of the toes left foot; stable x 3 weeks  -Patient seen at bedside this evening with his spouse and daughter present -We had a lengthy discussion regarding amputation versus conservative wound care.  I believe either is a viable option -After long discussion with the spouse and daughter they would like to pursue conservative wound care.  I do agree since the patient has only had the wounds for about 3 weeks and they have not pursued any wound care to see if the wounds can heal.  If wound care fails then recommend transmetatarsal amputation. -Will plan for the patient to follow-up in the office for routine debridement and weekly wound care.  No guarantees were obviously expressed or implied but I do believe that conservative wound care is a viable option and the patient has possible potential for healing -Okay to discharge from a podiatry standpoint.  Patient will follow-up in the office with me 1 week post discharge    Thank you for the consult.  Please contact me directly via secure chat with any questions or concerns.     Felecia Shelling, DPM [facility]  Dr. Felecia Shelling, DPM    [address] [place], [place] [address]                Office [phone]  Fax [phone]

## 2023-09-15 NOTE — Progress Notes (Signed)
Subjective Denies pain.  Physical exam Blood pressure (!) 93/46, pulse 87, temperature 97.8 F (36.6 C), temperature source Oral, resp. rate 18, height 6\' 7"  (2.007 m), weight 123.7 kg, SpO2 98%.  Physical Exam: Constitutional: lying in bed, in no acute distress Cardiovascular: regular rate and rhythm, no m/r/g Pulmonary/Chest: normal work of breathing on room air, lungs clear to auscultation bilaterally Abdominal: soft, non-tender, non-distended Neurological: alert & oriented to x 3 (unable to provide month) Skin: L foot with marked darkening, crusting of his 1st - 5th digits to distal midfoot, no appreciable temperature differences bilaterally, mild tenderness to palpation, right 4th digit darkened Psych: normal mood and behavior   Weight change: 26.6 kg   Intake/Output Summary (Last 24 hours) at 09/15/2023 0725 Last data filed at 09/14/2023 1740 Gross per 24 hour  Intake 340 ml  Output --  Net 340 ml   Net IO Since Admission: -80.01 mL [09/15/23 0725]  Labs, images, and other studies    Latest Ref Rng & Units 09/14/2023    5:03 AM 09/12/2023   11:50 PM 09/12/2023    5:00 PM  CBC  WBC 4.0 - 10.5 K/uL 6.9  6.3  7.1   Hemoglobin 13.0 - 17.0 g/dL 40.9  81.1  91.4   Hematocrit 39.0 - 52.0 % 31.7  35.0  37.5   Platelets 150 - 400 K/uL 187  208  217        Latest Ref Rng & Units 09/14/2023    5:03 AM 09/12/2023   11:50 PM 09/12/2023    5:00 PM  BMP  Glucose 70 - 99 mg/dL 78  782  956   BUN 8 - 23 mg/dL 21  31  27    Creatinine 0.61 - 1.24 mg/dL 2.13  0.86  5.78   Sodium 135 - 145 mmol/L 135  134  135   Potassium 3.5 - 5.1 mmol/L 4.3  4.6  4.1   Chloride 98 - 111 mmol/L 98  96  95   CO2 22 - 32 mmol/L 29  24  24    Calcium 8.9 - 10.3 mg/dL 9.1  9.4  9.8      Assessment and plan Hospital day 3  [name] is a 63 y.o. male with history of atrial fibrillation on Eliquis, CVA, hyperlipidemia, T2DM, and hypertension who presented to the ED from his PCP  office due to concerns for worsening foot gangrene.    #Dry Gangrene of Left Foot Vascular surgery consulted and recommended left TMA and patient/family deciding. Also Follows with Dr. Allena Katz with Podiatry. Family understandingly wanting second opinion before proceeding with TMA. Will reach out to podiatry today. MRI did not show evidence of osteo..  Afebrile without leukocytosis.  -Continue Vancomycin and Zosyn (Day 4) while awaiting surgical plan   #ESRD on HD MWF Nephrology following, appreciate recommendations.  Will proceed with inpatient HD per MWF.   #History of CVA -Given ESRD, will change Crestor to Lipitor 40 mg   #History of anxiety and depression  - Continue on home hydroxyzine, Abilify and Depakote    #History of A-fib Currently NSR.  Holding Eliquis pending potential need for surgical intervention. -Continue telemetry   #Hx of Heart transplant  - Cyclosporine and mycophenolate    #Hx of recurrent hemorrhoids  - Resume home rectal hydrocortisone cream   #Hx of peripheral neuropathy - Resume home Pregabalin    Diet: Regular VTE:  SCD Code: FULL  Surrogate: Wife and Daughter     Carmina Miller, [place] 09/15/2023, 7:25 AM  Pager: [phone] After 5pm or weekend: [phone]

## 2023-09-15 NOTE — Progress Notes (Signed)
[facility] Progress Note   Subjective:   Seen in room. Slept ok, no CP/dyspnea today. VVS following, podiatry involved too, no surgical plan yet, likely will be TMA.  Objective Vitals:   09/14/23 2054 09/14/23 2145 09/15/23 0537 09/15/23 0751  BP: (!) 85/58 92/64 (!) 93/46 102/64  Pulse: 80 80 87 83  Resp: 16  18 18   Temp: 98.4 F (36.9 C)  97.8 F (36.6 C) 97.9 F (36.6 C)  TempSrc: Oral  Oral Oral  SpO2: 100%  98% 98%  Weight: 123.2 kg  123.7 kg   Height:       Physical Exam General: Well appearing man, NAD. Room air Heart: RRR; no murmur Lungs: CTA anteriorly Abdomen: soft Extremities: 2+ BLE chronic edema, L foot with darkened toes c/w gangrene Dialysis Access: TDC in R chest  Additional Objective Labs: Basic Metabolic Panel: Recent Labs  Lab 09/12/23 2350 09/14/23 0503 09/15/23 0742  NA 134* 135 134*  K 4.6 4.3 4.8  CL 96* 98 96*  CO2 24 29 24   GLUCOSE 102* 78 70  BUN 31* 21 30*  CREATININE 7.90* 5.99* 7.89*  CALCIUM 9.4 9.1 9.0  PHOS 6.8* 5.4* 6.2*   Liver Function Tests: Recent Labs  Lab 09/12/23 1700 09/12/23 2350 09/14/23 0503 09/15/23 0742  AST 10*  --   --   --   ALT 8  --   --   --   ALKPHOS 88  --   --   --   BILITOT 1.0  --   --   --   PROT 6.6  --   --   --   ALBUMIN 3.1* 2.8* 2.3* 2.3*   CBC: Recent Labs  Lab 09/12/23 1700 09/12/23 2350 09/14/23 0503 09/15/23 0742  WBC 7.1 6.3 6.9 5.5  NEUTROABS 5.0 4.5  --   --   HGB 11.3* 11.0* 10.3* 10.4*  HCT 37.5* 35.0* 31.7* 32.5*  MCV 99.7 96.4 94.3 95.3  PLT 217 208 187 185   Blood Culture    Component Value Date/Time   SDES BLOOD LEFT HAND 09/13/2023 2306   SPECREQUEST  09/13/2023 2306    BOTTLES DRAWN AEROBIC AND ANAEROBIC Blood Culture results may not be optimal due to an inadequate volume of blood received in culture bottles   CULT  09/13/2023 2306    NO GROWTH < 12 HOURS Performed at [facility], [address], [place], [place] [address]     REPTSTATUS PENDING 09/13/2023 2306   Medications:  piperacillin-tazobactam (ZOSYN)  IV 2.25 g (09/15/23 0023)   [START ON 09/16/2023] vancomycin      ARIPiprazole  10 mg Oral q morning   Chlorhexidine Gluconate Cloth  6 each Topical Q0600   cycloSPORINE modified  25 mg Oral BID   And   cycloSPORINE modified  100 mg Oral BID   dextrose  1 ampule Intravenous Once   divalproex  250 mg Oral q morning   And   divalproex  500 mg Oral QHS   DULoxetine  60 mg Oral Daily   hydrOXYzine  25 mg Oral BID   insulin aspart  0-6 Units Subcutaneous TID AC & HS   midodrine  10 mg Oral TID WC   mycophenolate  720 mg Oral BID   pregabalin  50 mg Oral BID   rosuvastatin  10 mg Oral Daily   sevelamer carbonate  2,400 mg Oral TID WC   traZODone  100 mg Oral QHS    Dialysis  Orders MWF - [facility] 4hr, 500/A2.0, EDW 118.7kg, 2K/2Ca, UFP #4, TDC, no heparin - Hectoral IV q HD - ESA on hold, last Hgb 11 - PO sensipar 120mg  at bedtime   Assessment/Plan:  L foot gangrene: Blood Cx negative so far, on Vanc/Zosyn. MRI with ?osteo of 3rd toe. VVS following, felt to be optimized vascularly, surgical plan developing.  ESRD:  Usual MWF schedule - next HD Monday 1/20.  Hypotension/volume: Chronic hypotension on midodrine TID, UF as tolerated  Anemia: Hgb 10.4 - will resume ESA if Hgb drops lower.  Metabolic bone disease: CorrCa high - holding VDRA, Phos high, continue home binders (sevelamer). Resume sensipar.  Hx heart transplant: Continue home IS regimen  Hx CVA  A-fib: Eliquis on hold   Ozzie Hoyle, Cordelia Poche 09/15/2023, 9:01 AM  [employer]

## 2023-09-15 NOTE — Evaluation (Signed)
Physical Therapy Evaluation Patient Details Name: [name] MRN: [id number] DOB: May 27, 1961 Today's Date: 09/15/2023  History of Present Illness  Pt is a 63 y/o M presenting to ED on 1/16 wtih ischemic changes to L toes, admitted for gangrene of L foot. Vascular recommending L TMA. PMH includes heart transplant, A fib on Eliquis, DM2, HTN, CVA   Clinical Impression  Pt in bed upon arrival of PT, agreeable to evaluation at this time. Prior to admission the pt was able to mobilize short distances with use of RW and assistance for all standing and walking (either from family or PT), per his family, pt with recent decline in mobility, strength, and activity, and he now spends the majority of his days in bed. The pt needed modA of 2 for bed mobility and sit-stand transfers as well as increased time to power up and cues for hand placement, posture, and technique. Pt with limited endurance, needing seated rest after ~8 ft walking within the room, reports dizziness and fatigue. Upon taking BP, it was soft, and then dropped further with subsequent stand (see values below). May benefit from full orthostatic set next session. Given current need for assistance and family reports that they need to return to work and cannot provide 24/7 supervision much longer, he would benefit from continued inpatient therapies to improve independence prior to return home.   VITALS:  - sitting after ambulation - BP: 95/66 (73); HR: 95bpm - standing - BP: 62/49 (55); HR: 100bpm - sitting in recliner at end of session - BP: 86/62 (70); HR: 91bpm      If plan is discharge home, recommend the following: Two people to help with walking and/or transfers;A lot of help with bathing/dressing/bathroom;Assistance with cooking/housework;Assistance with feeding;Direct supervision/assist for medications management;Direct supervision/assist for financial management;Assist for transportation;Help with stairs or ramp for entrance;Supervision  due to cognitive status   Can travel by private vehicle   No    Equipment Recommendations Other (comment) (defer to post acute, family requesting power WC for improved independence with mobility)  Recommendations for Other Services       Functional Status Assessment Patient has had a recent decline in their functional status and demonstrates the ability to make significant improvements in function in a reasonable and predictable amount of time.     Precautions / Restrictions Precautions Precautions: Fall Precaution Comments: watch BP Restrictions Weight Bearing Restrictions Per Provider Order: No      Mobility  Bed Mobility Overal bed mobility: Needs Assistance Bed Mobility: Sidelying to Sit   Sidelying to sit: Mod assist, +2 for physical assistance       General bed mobility comments: assist at LE and trunk    Transfers Overall transfer level: Needs assistance Equipment used: Rolling walker (2 wheels) Transfers: Sit to/from Stand Sit to Stand: Mod assist, +2 physical assistance, From elevated surface           General transfer comment: modA of 2 from lower surface, minA of 2 from elevated surface. cues for hand placement. slow to rise    Ambulation/Gait Ambulation/Gait assistance: Min assist, +2 physical assistance, +2 safety/equipment Gait Distance (Feet): 8 Feet Assistive device: Rolling walker (2 wheels) Gait Pattern/deviations: Step-through pattern, Decreased stride length, Decreased dorsiflexion - right, Decreased dorsiflexion - left, Shuffle, Trunk flexed Gait velocity: decreased Gait velocity interpretation: <1.31 ft/sec, indicative of household ambulator Pre-gait activities: standing marches x5 General Gait Details: small steps with minimal clearance and stride length. no buckling but significant limitation in endurance. max  cues for posture and head up     Balance Overall balance assessment: Needs assistance Sitting-balance support: Feet  supported Sitting balance-Leahy Scale: Fair     Standing balance support: During functional activity Standing balance-Leahy Scale: Poor Standing balance comment: reliant on external support                             Pertinent Vitals/Pain Pain Assessment Pain Assessment: No/denies pain    Home Living Family/patient expects to be discharged to:: Private residence Living Arrangements: Spouse/significant other;Other relatives (daughter and granddaugther) Available Help at Discharge: Family;Available 24 hours/day Type of Home: House Home Access: Ramped entrance       Home Layout: One level Home Equipment: Agricultural consultant (2 wheels);Shower seat;Wheelchair - manual;BSC/3in1;Lift chair;Hospital bed      Prior Function Prior Level of Function : Needs assist             Mobility Comments: RW and physical assist to w/c, fall within the past 2 months ADLs Comments: takes access bus to HD, assist for all ADLs     Extremity/Trunk Assessment   Upper Extremity Assessment Upper Extremity Assessment: Generalized weakness    Lower Extremity Assessment Lower Extremity Assessment: Generalized weakness (grossly 3/5 to MMT, better functionally but limited power and endurance. visible muscle wasting)    Cervical / Trunk Assessment Cervical / Trunk Assessment: Kyphotic;Other exceptions Cervical / Trunk Exceptions: frail  Communication   Communication Communication: No apparent difficulties  Cognition Arousal: Alert Behavior During Therapy: Flat affect Overall Cognitive Status: History of cognitive impairments - at baseline                                 General Comments: hx of dementia, slow responses to questions asked        General Comments General comments (skin integrity, edema, etc.): soft BP noted after standing/walking. dropped again in standing, would benefit from orthostatic set     Assessment/Plan    PT Assessment Patient needs  continued PT services  PT Problem List Decreased strength;Decreased activity tolerance;Decreased balance;Decreased mobility;Decreased safety awareness;Impaired tone       PT Treatment Interventions DME instruction;Stair training;Gait training;Functional mobility training;Therapeutic activities;Therapeutic exercise;Balance training;Patient/family education    PT Goals (Current goals can be found in the Care Plan section)  Acute Rehab PT Goals Patient Stated Goal: returnhome PT Goal Formulation: With patient/family Time For Goal Achievement: 09/29/23 Potential to Achieve Goals: Good    Frequency Min 1X/week        AM-PAC PT "6 Clicks" Mobility  Outcome Measure Help needed turning from your back to your side while in a flat bed without using bedrails?: A Lot Help needed moving from lying on your back to sitting on the side of a flat bed without using bedrails?: A Lot Help needed moving to and from a bed to a chair (including a wheelchair)?: A Lot Help needed standing up from a chair using your arms (e.g., wheelchair or bedside chair)?: A Lot Help needed to walk in hospital room?: A Lot Help needed climbing 3-5 steps with a railing? : Total 6 Click Score: 11    End of Session Equipment Utilized During Treatment: Gait belt Activity Tolerance: Patient limited by fatigue Patient left: in chair;with call bell/phone within reach;with chair alarm set;with family/visitor present Nurse Communication: Mobility status (BP) PT Visit Diagnosis: Unsteadiness on feet (R26.81);Muscle weakness (generalized) (M62.81)  Time: 1324-4010 PT Time Calculation (min) (ACUTE ONLY): 43 min   Charges:   PT Evaluation $PT Eval Moderate Complexity: 1 Mod   PT General Charges $$ ACUTE PT VISIT: 1 Visit         Vickki Muff, PT, DPT   Acute Rehabilitation Department Office [phone] Secure Chat Communication Preferred  Ronnie Derby 09/15/2023, 1:59 PM

## 2023-09-15 NOTE — Evaluation (Addendum)
Occupational Therapy Evaluation Patient Details Name: [name] MRN: [id number] DOB: [birth year] Today's Date: 09/15/2023   History of Present Illness Pt is a 63 y/o M presenting to ED on 1/16 wtih ischemic changes to L toes, admitted for gangrene of L foot. Vascular recommending L TMA. PMH includes heart transplant, A fib on Eliquis, DM2, HTN, CVA   Clinical Impression   Pt lives with spouse, has assist at baseline with all ADLs and uses w/c for mobility (transfers with RW and physical assist). Pt currently needing up to max A for ADLs, mod +2 for bed mobility and transfer with RW. Pt able to walk to sink for grooming task with close chair follow, but needed to sit upon reaching sink due to dizziness (pt orthostatic, see vitals below). Spouse states pt's  BP is typically lower (SBP 70s). Pt presenting with impairments listed below, will follow acutely. Patient will benefit from continued inpatient follow up therapy, <3 hours/day to maximize safety/ind with ADL/functional mobility.  BP seated 95/66 (73) BP standing 62/49 (55) BP seated post-transfer 86/62 (70)       If plan is discharge home, recommend the following: A lot of help with walking and/or transfers;A lot of help with bathing/dressing/bathroom;Assistance with cooking/housework;Direct supervision/assist for medications management;Direct supervision/assist for financial management;Assist for transportation;Help with stairs or ramp for entrance    Functional Status Assessment  Patient has had a recent decline in their functional status and demonstrates the ability to make significant improvements in function in a reasonable and predictable amount of time.  Equipment Recommendations  Other (comment) (defer)    Recommendations for Other Services PT consult     Precautions / Restrictions Precautions Precautions: Fall Precaution Comments: watch BP Restrictions Weight Bearing Restrictions Per Provider Order: No       Mobility Bed Mobility Overal bed mobility: Needs Assistance Bed Mobility: Sidelying to Sit   Sidelying to sit: Mod assist, +2 for physical assistance            Transfers Overall transfer level: Needs assistance Equipment used: Rolling walker (2 wheels) Transfers: Sit to/from Stand Sit to Stand: Mod assist, +2 physical assistance, From elevated surface           General transfer comment: from elevated bed and chair heights      Balance Overall balance assessment: Needs assistance Sitting-balance support: Feet supported Sitting balance-Leahy Scale: Fair     Standing balance support: During functional activity Standing balance-Leahy Scale: Poor Standing balance comment: reliant on external support                           ADL either performed or assessed with clinical judgement   ADL Overall ADL's : Needs assistance/impaired Eating/Feeding: Minimal assistance   Grooming: Minimal assistance   Upper Body Bathing: Moderate assistance   Lower Body Bathing: Maximal assistance   Upper Body Dressing : Moderate assistance   Lower Body Dressing: Maximal assistance   Toilet Transfer: Moderate assistance;+2 for physical assistance   Toileting- Clothing Manipulation and Hygiene: Maximal assistance       Functional mobility during ADLs: Moderate assistance;+2 for physical assistance       Vision   Vision Assessment?: No apparent visual deficits     Perception Perception: Not tested       Praxis Praxis: Not tested       Pertinent Vitals/Pain Pain Assessment Pain Assessment: No/denies pain     Extremity/Trunk Assessment Upper Extremity Assessment Upper Extremity Assessment:  Generalized weakness (tremoring movements RUE > LUE)   Lower Extremity Assessment Lower Extremity Assessment: Defer to PT evaluation   Cervical / Trunk Assessment Cervical / Trunk Assessment: Kyphotic   Communication Communication Communication: No apparent  difficulties   Cognition Arousal: Alert Behavior During Therapy: Flat affect Overall Cognitive Status: History of cognitive impairments - at baseline                                 General Comments: hx of dementia, slow responses to questions asked     General Comments  see note for BP measures, pt orthostatic    Exercises     Shoulder Instructions      Home Living Family/patient expects to be discharged to:: Private residence Living Arrangements: Spouse/significant other;Other relatives (daughter and granddaugther) Available Help at Discharge: Family;Available 24 hours/day Type of Home: House Home Access: Ramped entrance     Home Layout: One level     Bathroom Shower/Tub: Walk-in shower;Sponge bathes at baseline   [employer]: Standard     Home Equipment: Agricultural consultant (2 wheels);Shower seat;Wheelchair - manual;BSC/3in1;Lift chair;Hospital bed          Prior Functioning/Environment Prior Level of Function : Needs assist             Mobility Comments: RW and physical assist to w/c, fall within the past 2 months ADLs Comments: takes access bus to HD, assist for all ADLs        OT Problem List: Decreased strength;Decreased range of motion;Decreased activity tolerance;Impaired balance (sitting and/or standing);Decreased cognition;Decreased safety awareness;Cardiopulmonary status limiting activity      OT Treatment/Interventions: Self-care/ADL training;Therapeutic exercise;Energy conservation;DME and/or AE instruction;Therapeutic activities;Patient/family education;Balance training    OT Goals(Current goals can be found in the care plan section) Acute Rehab OT Goals Patient Stated Goal: none stated OT Goal Formulation: With patient Time For Goal Achievement: 09/29/23 Potential to Achieve Goals: Good ADL Goals Pt Will Perform Upper Body Dressing: with contact guard assist;sitting Pt Will Perform Lower Body Dressing: with mod  assist;sitting/lateral leans;sit to/from stand Pt Will Transfer to Toilet: with min assist;squat pivot transfer;stand pivot transfer;bedside commode Pt/caregiver will Perform Home Exercise Program: Both right and left upper extremity;Increased strength;Increased ROM;With written HEP provided;With Supervision Additional ADL Goal #1: pt will perform bed mobility CGA in prep for ADLs  OT Frequency: Min 1X/week    Co-evaluation              AM-PAC OT "6 Clicks" Daily Activity     Outcome Measure Help from another person eating meals?: A Little Help from another person taking care of personal grooming?: A Little Help from another person toileting, which includes using toliet, bedpan, or urinal?: A Lot Help from another person bathing (including washing, rinsing, drying)?: A Lot Help from another person to put on and taking off regular upper body clothing?: A Lot Help from another person to put on and taking off regular lower body clothing?: A Lot 6 Click Score: 14   End of Session Equipment Utilized During Treatment: Gait belt;Rolling walker (2 wheels) Nurse Communication: Mobility status  Activity Tolerance: Patient tolerated treatment well Patient left: with call bell/phone within reach;with chair alarm set;with family/visitor present;in chair  OT Visit Diagnosis: Unsteadiness on feet (R26.81);Other abnormalities of gait and mobility (R26.89);Muscle weakness (generalized) (M62.81)                Time: 1324-4010 OT Time Calculation (min): 43  min Charges:  OT General Charges $OT Visit: 1 Visit OT Evaluation $OT Eval Moderate Complexity: 1 Mod OT Treatments $Self Care/Home Management : 8-22 mins  Carver Fila, OTD, OTR/L SecureChat Preferred Acute Rehab [phone]    Carver Fila Koonce 09/15/2023, 1:02 PM

## 2023-09-16 ENCOUNTER — Other Ambulatory Visit (HOSPITAL_COMMUNITY): Payer: Self-pay

## 2023-09-16 DIAGNOSIS — I96 Gangrene, not elsewhere classified: Secondary | ICD-10-CM | POA: Diagnosis not present

## 2023-09-16 DIAGNOSIS — Z941 Heart transplant status: Secondary | ICD-10-CM | POA: Diagnosis not present

## 2023-09-16 LAB — GLUCOSE, CAPILLARY
Glucose-Capillary: 63 mg/dL — ABNORMAL LOW (ref 70–99)
Glucose-Capillary: 53 mg/dL — ABNORMAL LOW (ref 70–99)
Glucose-Capillary: 60 mg/dL — ABNORMAL LOW (ref 70–99)
Glucose-Capillary: 78 mg/dL (ref 70–99)

## 2023-09-16 LAB — RENAL FUNCTION PANEL
Albumin: 2.3 g/dL — ABNORMAL LOW (ref 3.5–5.0)
Anion gap: 16 — ABNORMAL HIGH (ref 5–15)
BUN: 39 mg/dL — ABNORMAL HIGH (ref 8–23)
CO2: 24 mmol/L (ref 22–32)
Calcium: 8.4 mg/dL — ABNORMAL LOW (ref 8.9–10.3)
Chloride: 94 mmol/L — ABNORMAL LOW (ref 98–111)
Creatinine, Ser: 9.04 mg/dL — ABNORMAL HIGH (ref 0.61–1.24)
GFR, Estimated: 6 mL/min — ABNORMAL LOW (ref >60)
Glucose, Bld: 72 mg/dL (ref 70–99)
Phosphorus: 5.7 mg/dL — ABNORMAL HIGH (ref 2.5–4.6)
Potassium: 5 mmol/L (ref 3.5–5.1)
Sodium: 134 mmol/L — ABNORMAL LOW (ref 135–145)

## 2023-09-16 LAB — CBC
HCT: 32.5 % — ABNORMAL LOW (ref 39.0–52.0)
Hemoglobin: 10.4 g/dL — ABNORMAL LOW (ref 13.0–17.0)
MCH: 30.3 pg (ref 26.0–34.0)
MCHC: 32 g/dL (ref 30.0–36.0)
MCV: 94.8 fL (ref 80.0–100.0)
Platelets: 177 10*3/uL (ref 150–400)
RBC: 3.43 MIL/uL — ABNORMAL LOW (ref 4.22–5.81)
RDW: 15.9 % — ABNORMAL HIGH (ref 11.5–15.5)
WBC: 6.1 10*3/uL (ref 4.0–10.5)
nRBC: 0 % (ref 0.0–0.2)

## 2023-09-16 MED ORDER — CINACALCET HCL 30 MG PO TABS: 120.0000 mg | ORAL_TABLET | Freq: Every day | ORAL | Status: AC

## 2023-09-16 MED ORDER — VANCOMYCIN HCL IN DEXTROSE 1-5 GM/200ML-% IV SOLN: 1000.0000 mg | INTRAVENOUS | Status: AC

## 2023-09-16 MED ORDER — ATORVASTATIN CALCIUM 40 MG PO TABS
40.0000 mg | ORAL_TABLET | Freq: Every day | ORAL | 0 refills | Status: AC
  Filled 2023-09-16: qty 30, 30d supply, fill #0

## 2023-09-16 MED ORDER — MIDODRINE HCL 10 MG PO TABS
10.0000 mg | ORAL_TABLET | Freq: Three times a day (TID) | ORAL | 0 refills | Status: AC
  Filled 2023-09-16: qty 90, 30d supply, fill #0

## 2023-09-16 MED ORDER — PREGABALIN 75 MG PO CAPS
75.0000 mg | ORAL_CAPSULE | Freq: Every day | ORAL | 0 refills | Status: AC
  Filled 2023-09-16: qty 30, 30d supply, fill #0

## 2023-09-16 MED ORDER — GLUCOSE 40 % PO GEL
2.0000 | ORAL | Status: AC
  Administered 2023-09-16: 62 g via ORAL
  Filled 2023-09-16: qty 2.42

## 2023-09-16 MED ORDER — SODIUM CHLORIDE 0.9 % IV SOLN: 1.0000 g | INTRAVENOUS | Status: AC | PRN

## 2023-09-16 NOTE — Progress Notes (Signed)
[facility] Progress Note   Subjective:    Seen in [facility]. No new events. Soft BPs on HD. Alert, oriented. No new c/os.   Objective Vitals:   09/16/23 1030 09/16/23 1045 09/16/23 1100 09/16/23 1130  BP: (!) 81/60 (S) (!) 87/67 90/71 (S) (!) 83/56  Pulse: 64 66 71 66  Resp: 18 14 14 14   Temp:      TempSrc:      SpO2: 97% 98% 97% 97%  Weight:      Height:       Physical Exam General: Well appearing man, NAD. Room air Heart: RRR; no murmur Lungs: CTA anteriorly Abdomen: soft Extremities: 2+ BLE chronic edema, L foot with darkened toes c/w gangrene Dialysis Access: TDC in R chest  Additional Objective Labs: Basic Metabolic Panel: Recent Labs  Lab 09/14/23 0503 09/15/23 0742 09/16/23 0047  NA 135 134* 134*  K 4.3 4.8 5.0  CL 98 96* 94*  CO2 29 24 24   GLUCOSE 78 70 72  BUN 21 30* 39*  CREATININE 5.99* 7.89* 9.04*  CALCIUM 9.1 9.0 8.4*  PHOS 5.4* 6.2* 5.7*   Liver Function Tests: Recent Labs  Lab 09/12/23 1700 09/12/23 2350 09/14/23 0503 09/15/23 0742 09/16/23 0047  AST 10*  --   --   --   --   ALT 8  --   --   --   --   ALKPHOS 88  --   --   --   --   BILITOT 1.0  --   --   --   --   PROT 6.6  --   --   --   --   ALBUMIN 3.1*   < > 2.3* 2.3* 2.3*   < > = values in this interval not displayed.   CBC: Recent Labs  Lab 09/12/23 1700 09/12/23 2350 09/14/23 0503 09/15/23 0742 09/16/23 0047  WBC 7.1 6.3 6.9 5.5 6.1  NEUTROABS 5.0 4.5  --   --   --   HGB 11.3* 11.0* 10.3* 10.4* 10.4*  HCT 37.5* 35.0* 31.7* 32.5* 32.5*  MCV 99.7 96.4 94.3 95.3 94.8  PLT 217 208 187 185 177   Blood Culture    Component Value Date/Time   SDES BLOOD LEFT HAND 09/13/2023 2306   SPECREQUEST  09/13/2023 2306    BOTTLES DRAWN AEROBIC AND ANAEROBIC Blood Culture results may not be optimal due to an inadequate volume of blood received in culture bottles   CULT  09/13/2023 2306    NO GROWTH 3 DAYS Performed at [facility], [address]  [place], [place] [address]    REPTSTATUS PENDING 09/13/2023 2306   Medications:  piperacillin-tazobactam (ZOSYN)  IV 2.25 g (09/16/23 0443)   vancomycin      ARIPiprazole  10 mg Oral q morning   atorvastatin  40 mg Oral Daily   Chlorhexidine Gluconate Cloth  6 each Topical Q0600   cinacalcet  120 mg Oral Q supper   cycloSPORINE modified  25 mg Oral BID   And   cycloSPORINE modified  100 mg Oral BID   dextrose  1 ampule Intravenous Once   divalproex  250 mg Oral q morning   And   divalproex  500 mg Oral QHS   DULoxetine  60 mg Oral Daily   insulin aspart  0-6 Units Subcutaneous TID AC & HS   midodrine  10 mg Oral TID WC   mycophenolate  720 mg Oral BID   pregabalin  50 mg Oral BID   sevelamer carbonate  2,400 mg Oral TID WC   traZODone  100 mg Oral QHS    Dialysis Orders MWF - [facility] 4hr, 500/A2.0, EDW 118.7kg, 2K/2Ca, UFP #4, TDC, no heparin - Hectoral IV q HD - ESA on hold, last Hgb 11 - PO sensipar 120mg  at bedtime   Assessment/Plan:  L foot gangrene: Blood Cx negative so far, on Vanc/Zosyn. MRI with ?osteo of 3rd toe. VVS following, felt to be optimized vascularly. Podiatry following - conservative wound care for now.  ESRD:  Usual MWF schedule - next  HD Monday 1/20.  Hypotension/volume: Chronic hypotension on midodrine TID, UF as tolerated  Anemia: Hgb 10.4 - will resume ESA if Hgb drops lower.  Metabolic bone disease: CorrCa high - holding VDRA, Phos high, continue home binders (sevelamer). Resume sensipar.  Hx heart transplant: Continue home IS regimen  Hx CVA  A-fib: Eliquis on hold  Ondra Deboard Susann Givens PA-C [facility] 09/16/2023,11:41 AM

## 2023-09-16 NOTE — Discharge Summary (Addendum)
Name: [name] MRN: [id number] DOB: [birth year] 63 y.o. PCP: Pcp, No  Date of Admission: 09/12/2023  4:32 PM Date of Discharge:  09/16/2023 Attending Physician: Dr.  Johny Sax  DISCHARGE DIAGNOSIS:  Primary Problem: Gangrene of foot [facility])   Hospital Problems: Principal Problem:   Gangrene of foot (HCC) Active Problems:   Heart transplant recipient [facility])   Secondary hypertension   Diabetic foot infection (HCC)    DISCHARGE MEDICATIONS:   Allergies as of 09/16/2023       Reactions   Heparin Nausea Only, Swelling, Other (See Comments)   * * HIT * * SWELLING REACTION UNSPECIFIED  DIAPHORESIS   Iodinated Contrast Media    Unknown reaction   Penicillin G Potassium [penicillin G] Nausea Only, Other (See Comments)   DIAPHORESIS High Doses        Medication List     STOP taking these medications    hydrOXYzine 25 MG tablet Commonly known as: ATARAX   rosuvastatin 10 MG tablet Commonly known as: CRESTOR       TAKE these medications    acetaminophen 500 MG tablet Commonly known as: TYLENOL Take 1,000 mg by mouth 2 (two) times daily as needed for moderate pain (pain score 4-6).   ARIPiprazole 10 MG tablet Commonly known as: ABILIFY Take 10 mg by mouth every morning.   atorvastatin 40 MG tablet Commonly known as: LIPITOR Take 1 tablet (40 mg total) by mouth daily.   cefTAZidime 1 g in sodium chloride 0.9 % 100 mL Inject 1 g into the vein every dialysis for up to 21 days. Administer AFTER HD   cinacalcet 30 MG tablet Commonly known as: SENSIPAR Take 4 tablets (120 mg total) by mouth daily with supper.   cycloSPORINE modified 100 MG capsule Commonly known as: NEORAL Take 100 mg by mouth See admin instructions. Take with 25 mg for a total of 125 mg twice daily   cycloSPORINE modified 25 MG Caps Take 25 mg by mouth See admin instructions. Take with 100 mg for a total of 125 mg twice daily   divalproex 125 MG DR tablet Commonly known as:  DEPAKOTE Take 250 mg by mouth in morning and 500 mg by mouth at night What changed:  how much to take how to take this when to take this   DULoxetine 60 MG capsule Commonly known as: Cymbalta Take 1 capsule (60 mg total) by mouth daily.   Eliquis 2.5 MG Tabs tablet Generic drug: apixaban Take 1 tablet (2.5 mg total) by mouth 2 (two) times daily.   hydrocortisone 2.5 % rectal cream Commonly known as: ANUSOL-HC Apply 1 Application topically as needed for hemorrhoids or anal itching.   ibuprofen 200 MG tablet Commonly known as: ADVIL Take 400 mg by mouth as needed for moderate pain (pain score 4-6) or mild pain (pain score 1-3).   midodrine 10 MG tablet Commonly known as: PROAMATINE Take 1 tablet (10 mg total) by mouth 3 (three) times daily with meals. What changed: when to take this   mycophenolate 180 MG EC tablet Commonly known as: MYFORTIC Take 720 mg by mouth 2 (two) times daily.   naloxone 4 MG/0.1ML Liqd nasal spray kit Commonly known as: NARCAN Place 0.4 mg into the nose once as needed (opioid overdose).   oxyCODONE-acetaminophen 5-325 MG tablet Commonly known as: Percocet Take 1 tablet by mouth every 6 (six) hours as needed for severe pain (pain score 7-10). Do not take with other opiate pain medication  pregabalin 75 MG capsule Commonly known as: LYRICA Take 1 capsule (75 mg total) by mouth daily. What changed:  medication strength how much to take when to take this   sevelamer carbonate 800 MG tablet Commonly known as: RENVELA Take 2 tablets (1,600 mg total) by mouth 3 (three) times daily with meals. What changed:  how much to take additional instructions   traZODone 100 MG tablet Commonly known as: DESYREL Take 100 mg by mouth at bedtime.   vancomycin 1-5 GM/200ML-% Soln Commonly known as: VANCOCIN Inject 200 mLs (1,000 mg total) into the vein every Monday, Wednesday, and Friday with hemodialysis for 21 days. AFTER HD        DISPOSITION AND  FOLLOW-UP:  [name] was discharged from [facility] in Stable condition. At the hospital follow up visit please address:   Follow-up Information     Ginnie Smart, MD. Call in 1 day(s).   Specialties: Infectious Diseases, Internal Medicine Why: Call to make follow-up appointment ASAP Contact information: [address] [place] [address] [phone]                Dry Gangrene of Left Foot Assess for signs/symptoms of infection  Dementia Atarax discontinued this admission given hx of demential. Patient is on a number of psychoactive medications for anxiety/previous behavioral disturbances. Re-assess need for medications and consider psychiatry referral for further management.  HOSPITAL COURSE:   Admission HPI: [name] is 63 year old male with history of atrial fibrillation on Eliquis, CVA, hyperlipidemia, dementia with previous behavioral disturbances, and hypertension who presented to the ED from his PCP office due to concerns for worsening foot gangrene.   Patient was last seen on 09/06/2023 for foot discoloration and ABI at the time was unreliable 2/2 severe calcification. Vascular surgery evaluated the patient and didn't think there was a need for immediate inventions at that time. Plan was to follow up with podiatry in 3 weeks. The patient reports his left toe gangrene was getting worse and a bit painful so he went to his PCP and he was sent here for osteomyelitis rule out.   Patient report complete adherence to his dialysis regimen as well as his immunosuppressants and other medications.  He denies any trauma to the foot. He endorses fever, chills but has not had any cough, chest pain or shortness of breath during this time.   In the ED, MRI foots was ordered and Vascular surgery consulted  who agreed to see the patient tomorrow. IMTS was then consulted to admit.   #Dry Gangrene of Left Foot and 4th Digit of Right Foot MRI  showed no evidence of osteomyelitis. Vascular surgery consulted recommended left TMA given that microvascular flow is most likely compromise in the setting of ESRD. Patient/Family understandingly wanted second opinion before proceeding with TMA.  Podiatry consulted and will attempt conservative management with outpatient debridement on 1/22.  Patient/family aware of risks associated with conservative management and potential failed therapy.  IV vancomycin and Zosyn given during admission and will continue outpatient on HD days through 2/10 with Zosyn being exchanged for ceftazidime.  Patient afebrile without leukocytosis during admission.  Patient will be given postop shoe for each foot.   #ESRD on HD MWF Received inpatient HD per MWF without complication.  Patient discharged on midodrine 10 mg 3 times daily.   #History of CVA LDL this admission is 79.  Crestor changed to Lipitor 40 mg daily given ESRD.   #History  of dementia with previous behavioral disturbances Hydroxyzine discontinued.  Abilify, Cymbalta, trazodone continued during admission with instructions to follow-up with PCP regarding medication management and potential psychiatry referral/  #History of A-fib Remained NSR  #Hx of Heart transplant  - Cyclosporine and mycophenolate continued   #Hx of recurrent hemorrhoids  - Resume home rectal hydrocortisone cream continued   #Hx of peripheral neuropathy Given ESRD pregabalin decreased from 50 mg twice daily to 75 mg daily       DISCHARGE INSTRUCTIONS:   Discharge Instructions     Call MD for:  difficulty breathing, headache or visual disturbances   Complete by: As directed    Call MD for:  extreme fatigue   Complete by: As directed    Call MD for:  hives   Complete by: As directed    Call MD for:  persistant dizziness or light-headedness   Complete by: As directed    Call MD for:  persistant nausea and vomiting   Complete by: As directed    Call MD for:  redness,  tenderness, or signs of infection (pain, swelling, redness, odor or green/yellow discharge around incision site)   Complete by: As directed    Call MD for:  severe uncontrolled pain   Complete by: As directed    Call MD for:  temperature >100.4   Complete by: As directed    Diet - low sodium heart healthy   Complete by: As directed    Increase activity slowly   Complete by: As directed    No wound care   Complete by: As directed    Outpatient Parenteral Antibiotic Therapy Consult   Complete by: As directed    Antibiotic:  Vancomycin IVPB Ceftazidime Elita Quick) IVPB     Indications for use: Soft Tissue Infection   End Date: 09/27/2023   Approving ID Provider's Name: Johny Sax   To be given to patient MWF AFTER HD       SUBJECTIVE:  Denies foot pain. Comfortable with discharge Discharge Vitals:   BP 93/62   Pulse 75   Temp (!) 97.5 F (36.4 C) (Oral)   Resp 15   Ht 6\' 7"  (2.007 m)   Wt 123.1 kg   SpO2 96%   BMI 30.57 kg/m   OBJECTIVE:  Physical Exam:  Constitutional: lying in HD bed, in no acute distress Cardiovascular: regular rate and rhythm, no m/r/g Pulmonary/Chest: normal work of breathing on room air, lungs clear to auscultation bilaterally Abdominal: soft, non-tender, non-distended Skin: L foot with marked darkening, crusting of his 1st - 5th digits to distal midfoot, no appreciable temperature differences bilaterally, mild tenderness to palpation, right 4th digit darkened Psych: normal mood and behavior   Pertinent Labs, Studies, and Procedures:     Latest Ref Rng & Units 09/16/2023   12:47 AM 09/15/2023    7:42 AM 09/14/2023    5:03 AM  CBC  WBC 4.0 - 10.5 K/uL 6.1  5.5  6.9   Hemoglobin 13.0 - 17.0 g/dL 11.9  14.7  82.9   Hematocrit 39.0 - 52.0 % 32.5  32.5  31.7   Platelets 150 - 400 K/uL 177  185  187        Latest Ref Rng & Units 09/16/2023   12:47 AM 09/15/2023    7:42 AM 09/14/2023    5:03 AM  CMP  Glucose 70 - 99 mg/dL 72  70  78   BUN 8  - 23 mg/dL 39  30  21  Creatinine 0.61 - 1.24 mg/dL 8.65  7.84  6.96   Sodium 135 - 145 mmol/L 134  134  135   Potassium 3.5 - 5.1 mmol/L 5.0  4.8  4.3   Chloride 98 - 111 mmol/L 94  96  98   CO2 22 - 32 mmol/L 24  24  29    Calcium 8.9 - 10.3 mg/dL 8.4  9.0  9.1     MRI Left foot without contrast Result Date: 09/13/2023 CLINICAL DATA:  Osteonecrosis suspected, foot. EXAM: MRI OF THE LEFT FOOT WITHOUT CONTRAST TECHNIQUE: Multiplanar, multisequence [name] imaging of the left forefoot was performed. No intravenous contrast was administered. COMPARISON:  Left foot radiographs 09/06/2023 FINDINGS: Despite efforts by the technologist and patient, moderate to high-grade motion artifact is present on today's exam and could not be eliminated. This reduces exam sensitivity and specificity. There is metaphysis of the artifact from a screw within the proximal and middle phalanges of the fourth toe. Bones/Joint/Cartilage Within the limitation of motion artifact, there is mild-to-moderate midfoot intertarsal and tarsometatarsal cartilage thinning. The prior resection of the middle phalanx and distal aspect of the proximal phalanx of second toe seen on recent radiographs is not well visualized due to motion artifact. There is second metatarsophalangeal cartilage thinning with mild subchondral marrow edema (sagittal series 4, image 16 and coronal series 12, image 13). There is diffuse marrow edema throughout the third metatarsal head (sagittal image 21, coronal image 9, axial image 19). There is curvilinear decreased T1 and surrounding increased T2 signal within the mid to lateral aspect of the fourth metatarsal head articular surface (coronal image 9, sagittal image 27, axial image 21). Motion artifact markedly limits evaluation of these second through fourth tarsometatarsal joints. This marrow edema is compatible with degenerative change. It is difficult to exclude early osteonecrosis given the curvilinear signal within  the fourth and possibly third metatarsal heads, however this curvilinear edema also may be posttraumatic. There does appear to be an acute to subacute fracture of the medial base of the proximal phalanx of the fourth toe on prior radiographs, although motion artifact limits evaluation of this region. There is likely fluid sensitive signal along this nonunited fracture on coronal series 12, image 9. Ligaments The Lisfranc ligament complex is intact. Muscles and Tendons Diffuse nonspecific muscle edema throughout the plantar greater than dorsal midfoot and forefoot, greatest within the abductor hallucis and, quadratus plantae, flexor digitorum brevis, and abductor digiti minimi muscles. Soft tissues Please note the plantar soft tissue ulceration on recent 09/06/2023 radiographs was at the level of the posterior cuboid. This appears to be imaged on the current MRI, however no significant ulceration is seen in this region. There is mild decreased T1 and decreased T2 signal scarring in the posterior most coronal images (coronal series 8, image 66/66, however this is at the level of the plantar calcaneus and does not appear to be the same region. There is fluid bright signal indicating thinning of the skin and subcutaneous fat lateral to the cuboid in a region measuring up to approximately 3 cm in craniocaudal dimension (coronal series 8, image 56). This is lateral to the distal peroneus brevis tendon. No definite marrow edema or cortical erosion is seen within the adjacent cuboid, within the limitation of motion artifact. High-grade subcutaneous fat edema and swelling seen throughout the ankle and midfoot/forefoot. IMPRESSION: 1. Moderate to high-grade motion artifact technically limits this examination. 2. Likely corresponding to the soft tissue lucency within the plantar lateral midfoot on recent 09/06/2023  radiographs, fluid bright signal indicating thinning of the skin and subcutaneous fat is seen lateral to the  cuboid in a region measuring up to approximately 3 cm in craniocaudal dimension. No definite marrow edema or cortical erosion is seen within the adjacent cuboid to indicate acute osteomyelitis, within the limitation of motion artifact. 3. There does appear to be an acute to subacute fracture of the medial base of the proximal phalanx of the fourth toe on 09/05/2022 radiographs, although motion artifact limits evaluation of this region on MRI. 4. There is diffuse marrow edema throughout the third metatarsal head. There is curvilinear decreased T1 and surrounding edema within the mid to lateral aspect of the fourth metatarsal head articular surface. Motion artifact markedly limits evaluation of these second through fourth tarsometatarsal joints. This marrow edema is compatible with degenerative change. It is difficult to exclude early osteonecrosis given the curvilinear signal within the fourth and possibly third metatarsal heads, however this curvilinear edema also may be posttraumatic. 5. High-grade subcutaneous fat edema and swelling throughout the ankle and midfoot/forefoot. Electronically Signed   By: Neita Garnet M.D.   On: 09/13/2023 10:13     Signed: Carmina Miller, DO  Internal Medicine Resident, PGY-1 [facility]  Pager: [phone] 12:56 PM, 09/16/2023

## 2023-09-16 NOTE — Progress Notes (Signed)
Orthopedic Tech Progress Note Patient Details:  [name] 11-Aug-1961 [id number]  Spoke with MD this morning in regard to a POST OP SHOE/BOOT for discharge per MD. Called floor letting RN know to reach out when patient returns to floor so I can provide the shoe for patient.   Patient ID: [name], male   DOB: 09/07/1960, 63 y.o.   MRN: [id number]  [name] 09/16/2023, 9:57 AM

## 2023-09-16 NOTE — TOC Transition Note (Addendum)
Transition of Care [facility]) - Discharge Note   Patient Details  Name: [name] MRN: [id number] Date of Birth: 29-Jun-1961  [facility] CM/SW Contact:  Leone Haven, RN Phone Number: 09/16/2023, 3:24 PM   Clinical Narrative:    For dc today, wife will transport home, he will need to received the iv abx prior to dc per Staff RN.  He is active with [facility] and Palliative services with [facility].   Final next level of care: Home w Home Health Services Barriers to Discharge: No Barriers Identified   Patient Goals and CMS Choice Patient states their goals for this hospitalization and ongoing recovery are:: return home CMS Medicare.gov Compare Post Acute Care list provided to:: Patient Choice offered to / list presented to : Patient      Discharge Placement                       Discharge Plan and Services Additional resources added to the After Visit Summary for   In-house Referral: NA Discharge Planning Services: CM Consult Post Acute Care Choice: Resumption of Svcs/PTA Provider          DME Arranged: N/A DME Agency: NA       HH Arranged: RN, PT, OT HH Agency:  [employer]) Date HH Agency Contacted: 09/16/23 Time HH Agency Contacted: 1520 Representative spoke with at [facility] Agency: Melissa  Social Drivers of Health (SDOH) Interventions SDOH Screenings   Food Insecurity: No Food Insecurity (09/13/2023)  Housing: Low Risk  (09/13/2023)  Transportation Needs: No Transportation Needs (09/13/2023)  Utilities: Not At Risk (09/13/2023)  Depression (PHQ2-9): Low Risk  (05/05/2020)  Recent Concern: Depression (PHQ2-9) - Medium Risk (02/18/2020)  Tobacco Use: Medium Risk (09/12/2023)     Readmission Risk Interventions     No data to display

## 2023-09-16 NOTE — Progress Notes (Signed)
Physical Therapy Treatment Patient Details Name: [name] MRN: [id number] DOB: 03/23/1961 Today's Date: 09/16/2023   History of Present Illness Pt is a 63 y/o M presenting to ED on 1/16 wtih ischemic changes to L toes, admitted for gangrene of L foot. PMH includes heart transplant, A fib on Eliquis, DM2, HTN, CVA    PT Comments  PT session is limited as pt is hypoglycemic and hypotensive. BP does improve from 78/61 to 91/65 with mobility during session. Pt continues to require significant assistance to stand and remains at a high risk for falls due to weakness and impaired motor coordination. Pt will benefit from continued assistance for all functional mobility including bed mobility. He has great difficulty mobilizing within the bed at this time and is at a high risk for pressure injuries. PT continues to recommend short term inpatient PT services. If pt and family elect to discharge home the home health would be beneficial.   If plan is discharge home, recommend the following: Two people to help with walking and/or transfers;A lot of help with bathing/dressing/bathroom;Assistance with cooking/housework;Assistance with feeding;Direct supervision/assist for medications management;Direct supervision/assist for financial management;Assist for transportation;Help with stairs or ramp for entrance;Supervision due to cognitive status   Can travel by private vehicle     No  Equipment Recommendations  Other (comment) (family requested power wheelchair on evaluation, these evaluations are typically best done in the outpatient setting. This may be most appropriate due to patient's height)    Recommendations for Other Services       Precautions / Restrictions Precautions Precautions: Fall Precaution Comments: watch BP and blood sugar Restrictions Weight Bearing Restrictions Per Provider Order: No     Mobility  Bed Mobility Overal bed mobility: Needs Assistance Bed Mobility: Supine to Sit,  Sit to Supine     Supine to sit: Mod assist, HOB elevated Sit to supine: Max assist, HOB elevated        Transfers Overall transfer level: Needs assistance Equipment used: Rolling walker (2 wheels), 1 person hand held assist Transfers: Sit to/from Stand Sit to Stand: Mod assist, From elevated surface           General transfer comment: posterior lean with transfer attempts    Ambulation/Gait Ambulation/Gait assistance: Min assist Gait Distance (Feet): 4 Feet Assistive device: Rolling walker (2 wheels) Gait Pattern/deviations: Shuffle, Step-to pattern Gait velocity: reduced Gait velocity interpretation: <1.31 ft/sec, indicative of household ambulator   General Gait Details: side stepping at edge of bed, reduced foot clearance bilaterally, very small step size   Stairs             Wheelchair Mobility     Tilt Bed    Modified Rankin (Stroke Patients Only)       Balance Overall balance assessment: Needs assistance Sitting-balance support: No upper extremity supported, Feet supported Sitting balance-Leahy Scale: Fair     Standing balance support: Bilateral upper extremity supported, Reliant on assistive device for balance Standing balance-Leahy Scale: Poor                              Cognition Arousal: Alert Behavior During Therapy: Flat affect, Agitated (flat overall interrupted by brief periods of agitation) Overall Cognitive Status: History of cognitive impairments - at baseline                                 General  Comments: hx of dementia, slow responses to questions asked        Exercises      General Comments General comments (skin integrity, edema, etc.): pt with soft BP recently prior to PT arrival, 81/59 per RN. Pt also hypoglycemic with bloos sugar of 60. With sitting at edge of bed pt's BP is 78/61. After standing and brief period of ambulation ot's BP increases to 91/65.      Pertinent Vitals/Pain Pain  Assessment Pain Assessment: No/denies pain    Home Living                          Prior Function            PT Goals (current goals can now be found in the care plan section) Acute Rehab PT Goals Patient Stated Goal: return home Progress towards PT goals: Progressing toward goals (minimal progress, limited session due to hypotension and hypoglycemia.)    Frequency    Min 1X/week      PT Plan      Co-evaluation              AM-PAC PT "6 Clicks" Mobility   Outcome Measure  Help needed turning from your back to your side while in a flat bed without using bedrails?: A Lot Help needed moving from lying on your back to sitting on the side of a flat bed without using bedrails?: A Lot Help needed moving to and from a bed to a chair (including a wheelchair)?: A Lot Help needed standing up from a chair using your arms (e.g., wheelchair or bedside chair)?: A Lot Help needed to walk in hospital room?: A Lot Help needed climbing 3-5 steps with a railing? : Total 6 Click Score: 11    End of Session Equipment Utilized During Treatment: Gait belt Activity Tolerance: Patient limited by fatigue Patient left: in bed;with call bell/phone within reach;with bed alarm set Nurse Communication: Mobility status PT Visit Diagnosis: Unsteadiness on feet (R26.81);Muscle weakness (generalized) (M62.81)     Time: 0102-7253 PT Time Calculation (min) (ACUTE ONLY): 32 min  Charges:    $Therapeutic Activity: 23-37 mins PT General Charges $$ ACUTE PT VISIT: 1 Visit                     Arlyss Gandy, PT, DPT Acute Rehabilitation Office [phone]    Arlyss Gandy 09/16/2023, 2:54 PM

## 2023-09-16 NOTE — Progress Notes (Signed)
   09/16/23 1250  Vitals  Temp (!) 97.5 F (36.4 C)  Pulse Rate 75  Resp 15  BP 93/62  SpO2 96 %  O2 Device Room Air  Weight 121 kg  Type of Weight Post-Dialysis  Oxygen Therapy  Patient Activity (if Appropriate) In bed  Pulse Oximetry Type Continuous  Oximetry Probe Site Changed No  Post Treatment  Dialyzer Clearance Lightly streaked  Hemodialysis Intake (mL) 0 mL  Liters Processed 96  Fluid Removed (mL) 1500 mL  Tolerated HD Treatment Yes   Received patient in bed to unit.  Alert and oriented.  Informed consent signed and in chart.   TX duration: 4 hours  Patient tolerated well.  Transported back to the room  Alert, without acute distress.  Hand-off given to patient's nurse.   Access used: Right internal jugular catheter Access issues: None  Total UF removed: 1.5L - due to hypotension; patient asymptomatic during treatment. Medication(s) given: None

## 2023-09-16 NOTE — Progress Notes (Signed)
D/C order noted. Contacted [facility] to be advised of pt's d/c today and that pt should resume care on Wed. Charge RN confirms that clinic has iv vanc and iv ceftazidime to provide to pt with HD treatments. Renal PA to send orders to clinic at d/c for iv abx.   Olivia Canter Renal Navigator [phone]

## 2023-09-16 NOTE — TOC Initial Note (Signed)
Transition of Care [facility]) - Initial/Assessment Note    Patient Details  Name: [name] MRN: [id number] Date of Birth: Jan 14, 1961  Transition of Care [facility]) CM/SW Contact:    Leone Haven, RN Phone Number: 09/16/2023, 3:20 PM  Clinical Narrative:                 From home with spouse, has PCP , Dr. Sanjuana Mae and has a follow up apt on Thursday per wife, has  insurance on file, states is active with [facility] for [facility], HHPT, HHOT and would like to continue, has cane and walker at home.  States wife will  transport him  home at [employer] and family is support system.  NCM spoke Melissa, with [facility] and confirmed the services.    Expected Discharge Plan: Home w Home Health Services Barriers to Discharge: No Barriers Identified   Patient Goals and CMS Choice Patient states their goals for this hospitalization and ongoing recovery are:: return home CMS Medicare.gov Compare Post Acute Care list provided to:: Patient Choice offered to / list presented to : Patient      Expected Discharge Plan and Services In-house Referral: NA Discharge Planning Services: CM Consult Post Acute Care Choice: Resumption of Svcs/PTA Provider Living arrangements for the past 2 months: Single Family Home Expected Discharge Date: 09/16/23               DME Arranged: N/A DME Agency: NA       HH Arranged: RN, PT, OT HH Agency:  [employer]) Date HH Agency Contacted: 09/16/23 Time HH Agency Contacted: 1520 Representative spoke with at [facility] Agency: Melissa  Prior Living Arrangements/Services Living arrangements for the past 2 months: Single Family Home Lives with:: Spouse Patient language and need for interpreter reviewed:: Yes Do you feel safe going back to the place where you live?: Yes      Need for Family Participation in Patient Care: Yes (Comment) Care giver support system in place?: Yes (comment) Current home services: DME (cane and walker) Criminal Activity/Legal Involvement  Pertinent to Current Situation/Hospitalization: No - Comment as needed  Activities of Daily Living   ADL Screening (condition at time of admission) Independently performs ADLs?: No Does the patient have a NEW difficulty with bathing/dressing/toileting/self-feeding that is expected to last >3 days?: No (needs assist) Does the patient have a NEW difficulty with getting in/out of bed, walking, or climbing stairs that is expected to last >3 days?: No (needs assist) Does the patient have a NEW difficulty with communication that is expected to last >3 days?: No Is the patient deaf or have difficulty hearing?: No Does the patient have difficulty seeing, even when wearing glasses/contacts?: No Does the patient have difficulty concentrating, remembering, or making decisions?: No  Permission Sought/Granted Permission sought to share information with : Case Manager Permission granted to share information with : Yes, Verbal Permission Granted     Permission granted to share info w AGENCY: [facility]        Emotional Assessment Appearance:: Appears stated age Attitude/Demeanor/Rapport: Engaged Affect (typically observed): Appropriate Orientation: : Oriented to Self, Oriented to Place, Oriented to  Time, Oriented to Situation Alcohol / Substance Use: Not Applicable Psych Involvement: No (comment)  Admission diagnosis:  Gangrene of foot (HCC) [I96] Diabetic foot infection (HCC) [T01.601, L08.9] Patient Active Problem List   Diagnosis Date Noted   Diabetic foot infection (HCC) 09/14/2023   Secondary hypertension 09/13/2023   Gangrene of foot (HCC) 09/12/2023   Toe ulcer due to DM (HCC)  09/06/2023   Goals of care, counseling/discussion 09/06/2023   Toe ulcer, left, with necrosis of muscle (HCC) 09/06/2023   AMS (altered mental status) 11/29/2022   Aspiration pneumonia (HCC) 11/29/2022   PNA (pneumonia) 11/29/2022   Peripheral autonomic neuropathy due to DM (HCC) 11/01/2022   Orthostatic  hypotension 11/01/2022   Acute encephalopathy 07/04/2022   Chronic kidney disease, stage 5 (HCC) 11/14/2021   Gait abnormality 10/12/2021   Polyneuropathy associated with underlying disease (HCC) 10/12/2021   Neuropathic pain 10/12/2021   Morbid obesity (HCC) 11/19/2019   Sleep disorder 10/22/2019   Sleep disturbance 10/22/2019   Abnormality of gait 06/09/2019   Diabetic polyneuropathy associated with diabetes mellitus due to underlying condition (HCC) 04/14/2019   Peripheral neuropathy 12/11/2018   CVA (cerebral vascular accident) (HCC) 11/09/2018   Heart transplant recipient [facility]) 11/09/2018   Malignant neoplasm of prostate (HCC) 05/06/2018   Closed posterior dislocation of hip, right, initial encounter (HCC) 08/24/2017   End stage renal disease on dialysis (HCC) 08/24/2017   Hip dislocation, right (HCC) 08/24/2017   Osteoarthritis of right hip 07/22/2017   VENTRICULAR TACHYCARDIA 12/21/2009   Dyslipidemia 08/30/2009   Essential hypertension 08/30/2009   ISCHEMIC CARDIOMYOPATHY 08/30/2009   Atrial fibrillation (HCC) 08/30/2009   VENTRICULAR ECTOPY 08/30/2009   History of CVA (cerebrovascular accident) 08/30/2009   Obesity, Class III, BMI 40-49.9 (morbid obesity) (HCC) 08/30/2009   DM type 2 with diabetic peripheral neuropathy (HCC) 10/17/2007   OBSTRUCTIVE SLEEP APNEA 10/17/2007   CARDIOMYOPATHY, DILATED 10/17/2007   PCP:  Pcp, No Pharmacy:   [id number] [place], [place] - [address] AT [facility] & [address] [place] [address] Phone: [phone] Fax: [phone]  [facility] [address] [place] [address] Phone: [phone] Fax: [phone]     Social Drivers of Health (SDOH) Social History: SDOH Screenings   Food Insecurity: No Food Insecurity (09/13/2023)  Housing: Low Risk  (09/13/2023)  Transportation Needs: No Transportation Needs (09/13/2023)  Utilities:  Not At Risk (09/13/2023)  Depression (PHQ2-9): Low Risk  (05/05/2020)  Recent Concern: Depression (PHQ2-9) - Medium Risk (02/18/2020)  Tobacco Use: Medium Risk (09/12/2023)   SDOH Interventions:     Readmission Risk Interventions     No data to display

## 2023-09-16 NOTE — Plan of Care (Signed)
Problem: Education: Goal: Ability to describe self-care measures that may prevent or decrease complications (Diabetes Survival Skills Education) will improve 09/16/2023 1739 by Irwin Brakeman, RN Outcome: Adequate for Discharge 09/16/2023 1650 by Irwin Brakeman, RN Outcome: Progressing Goal: Individualized Educational Video(s) 09/16/2023 1739 by Irwin Brakeman, RN Outcome: Adequate for Discharge 09/16/2023 1650 by Irwin Brakeman, RN Outcome: Progressing   Problem: Coping: Goal: Ability to adjust to condition or change in health will improve 09/16/2023 1739 by Irwin Brakeman, RN Outcome: Adequate for Discharge 09/16/2023 1650 by Irwin Brakeman, RN Outcome: Progressing   Problem: Fluid Volume: Goal: Ability to maintain a balanced intake and output will improve 09/16/2023 1739 by Irwin Brakeman, RN Outcome: Adequate for Discharge 09/16/2023 1650 by Irwin Brakeman, RN Outcome: Not Progressing   Problem: Health Behavior/Discharge Planning: Goal: Ability to identify and utilize available resources and services will improve 09/16/2023 1739 by Irwin Brakeman, RN Outcome: Adequate for Discharge 09/16/2023 1650 by Irwin Brakeman, RN Outcome: Progressing Goal: Ability to manage health-related needs will improve 09/16/2023 1739 by Irwin Brakeman, RN Outcome: Adequate for Discharge 09/16/2023 1650 by Irwin Brakeman, RN Outcome: Progressing   Problem: Metabolic: Goal: Ability to maintain appropriate glucose levels will improve 09/16/2023 1739 by Irwin Brakeman, RN Outcome: Adequate for Discharge 09/16/2023 1650 by Irwin Brakeman, RN Outcome: Progressing   Problem: Nutritional: Goal: Maintenance of adequate nutrition will improve 09/16/2023 1739 by Irwin Brakeman, RN Outcome: Adequate for Discharge 09/16/2023 1650 by Irwin Brakeman, RN Outcome: Progressing Goal:  Progress toward achieving an optimal weight will improve 09/16/2023 1739 by Irwin Brakeman, RN Outcome: Adequate for Discharge 09/16/2023 1650 by Irwin Brakeman, RN Outcome: Progressing   Problem: Skin Integrity: Goal: Risk for impaired skin integrity will decrease 09/16/2023 1739 by Irwin Brakeman, RN Outcome: Adequate for Discharge 09/16/2023 1650 by Irwin Brakeman, RN Outcome: Progressing   Problem: Tissue Perfusion: Goal: Adequacy of tissue perfusion will improve 09/16/2023 1739 by Irwin Brakeman, RN Outcome: Adequate for Discharge 09/16/2023 1650 by Irwin Brakeman, RN Outcome: Progressing   Problem: Education: Goal: Knowledge of General Education information will improve Description: Including pain rating scale, medication(s)/side effects and non-pharmacologic comfort measures 09/16/2023 1739 by Irwin Brakeman, RN Outcome: Adequate for Discharge 09/16/2023 1650 by Irwin Brakeman, RN Outcome: Progressing   Problem: Health Behavior/Discharge Planning: Goal: Ability to manage health-related needs will improve 09/16/2023 1739 by Irwin Brakeman, RN Outcome: Adequate for Discharge 09/16/2023 1650 by Irwin Brakeman, RN Outcome: Progressing   Problem: Clinical Measurements: Goal: Ability to maintain clinical measurements within normal limits will improve 09/16/2023 1739 by Irwin Brakeman, RN Outcome: Adequate for Discharge 09/16/2023 1650 by Irwin Brakeman, RN Outcome: Progressing Goal: Will remain free from infection 09/16/2023 1739 by Irwin Brakeman, RN Outcome: Adequate for Discharge 09/16/2023 1650 by Irwin Brakeman, RN Outcome: Progressing Goal: Diagnostic test results will improve 09/16/2023 1739 by Irwin Brakeman, RN Outcome: Adequate for Discharge 09/16/2023 1650 by Irwin Brakeman, RN Outcome: Progressing Goal: Respiratory complications will  improve 09/16/2023 1739 by Irwin Brakeman, RN Outcome: Adequate for Discharge 09/16/2023 1650 by Irwin Brakeman, RN Outcome: Progressing Goal: Cardiovascular complication will be avoided 09/16/2023 1739 by Irwin Brakeman, RN Outcome: Adequate for Discharge 09/16/2023 1650 by Irwin Brakeman, RN Outcome: Progressing   Problem: Activity: Goal: Risk for activity intolerance will decrease 09/16/2023 1739 by Irwin Brakeman, RN Outcome: Adequate for  Discharge 09/16/2023 1650 by Irwin Brakeman, RN Outcome: Not Progressing   Problem: Nutrition: Goal: Adequate nutrition will be maintained 09/16/2023 1739 by Irwin Brakeman, RN Outcome: Adequate for Discharge 09/16/2023 1650 by Irwin Brakeman, RN Outcome: Progressing   Problem: Coping: Goal: Level of anxiety will decrease 09/16/2023 1739 by Irwin Brakeman, RN Outcome: Adequate for Discharge 09/16/2023 1650 by Irwin Brakeman, RN Outcome: Progressing   Problem: Elimination: Goal: Will not experience complications related to bowel motility 09/16/2023 1739 by Irwin Brakeman, RN Outcome: Adequate for Discharge 09/16/2023 1650 by Irwin Brakeman, RN Outcome: Progressing Goal: Will not experience complications related to urinary retention 09/16/2023 1739 by Irwin Brakeman, RN Outcome: Adequate for Discharge 09/16/2023 1650 by Irwin Brakeman, RN Outcome: Progressing   Problem: Pain Managment: Goal: General experience of comfort will improve and/or be controlled 09/16/2023 1739 by Irwin Brakeman, RN Outcome: Adequate for Discharge 09/16/2023 1650 by Irwin Brakeman, RN Outcome: Progressing   Problem: Safety: Goal: Ability to remain free from injury will improve 09/16/2023 1739 by Irwin Brakeman, RN Outcome: Adequate for Discharge 09/16/2023 1650 by Irwin Brakeman, RN Outcome: Progressing   Problem: Skin  Integrity: Goal: Risk for impaired skin integrity will decrease 09/16/2023 1739 by Irwin Brakeman, RN Outcome: Adequate for Discharge 09/16/2023 1650 by Irwin Brakeman, RN Outcome: Progressing

## 2023-09-16 NOTE — Plan of Care (Signed)
  Problem: Education: Goal: Ability to describe self-care measures that may prevent or decrease complications (Diabetes Survival Skills Education) will improve Outcome: Progressing Goal: Individualized Educational Video(s) Outcome: Progressing   Problem: Coping: Goal: Ability to adjust to condition or change in health will improve Outcome: Progressing   Problem: Health Behavior/Discharge Planning: Goal: Ability to identify and utilize available resources and services will improve Outcome: Progressing Goal: Ability to manage health-related needs will improve Outcome: Progressing   Problem: Metabolic: Goal: Ability to maintain appropriate glucose levels will improve Outcome: Progressing   Problem: Nutritional: Goal: Maintenance of adequate nutrition will improve Outcome: Progressing Goal: Progress toward achieving an optimal weight will improve Outcome: Progressing   Problem: Skin Integrity: Goal: Risk for impaired skin integrity will decrease Outcome: Progressing   Problem: Tissue Perfusion: Goal: Adequacy of tissue perfusion will improve Outcome: Progressing   Problem: Education: Goal: Knowledge of General Education information will improve Description: Including pain rating scale, medication(s)/side effects and non-pharmacologic comfort measures Outcome: Progressing   Problem: Health Behavior/Discharge Planning: Goal: Ability to manage health-related needs will improve Outcome: Progressing   Problem: Clinical Measurements: Goal: Ability to maintain clinical measurements within normal limits will improve Outcome: Progressing Goal: Will remain free from infection Outcome: Progressing Goal: Diagnostic test results will improve Outcome: Progressing Goal: Respiratory complications will improve Outcome: Progressing Goal: Cardiovascular complication will be avoided Outcome: Progressing   Problem: Nutrition: Goal: Adequate nutrition will be maintained Outcome:  Progressing   Problem: Coping: Goal: Level of anxiety will decrease Outcome: Progressing   Problem: Elimination: Goal: Will not experience complications related to bowel motility Outcome: Progressing Goal: Will not experience complications related to urinary retention Outcome: Progressing   Problem: Pain Managment: Goal: General experience of comfort will improve and/or be controlled Outcome: Progressing   Problem: Safety: Goal: Ability to remain free from injury will improve Outcome: Progressing   Problem: Skin Integrity: Goal: Risk for impaired skin integrity will decrease Outcome: Progressing   Problem: Fluid Volume: Goal: Ability to maintain a balanced intake and output will improve Outcome: Not Progressing   Problem: Activity: Goal: Risk for activity intolerance will decrease Outcome: Not Progressing  BP limiting activity

## 2023-09-16 NOTE — Plan of Care (Signed)
  Problem: Skin Integrity: Goal: Risk for impaired skin integrity will decrease Outcome: Progressing   Problem: Tissue Perfusion: Goal: Adequacy of tissue perfusion will improve Outcome: Progressing   Problem: Education: Goal: Knowledge of General Education information will improve Description: Including pain rating scale, medication(s)/side effects and non-pharmacologic comfort measures Outcome: Progressing

## 2023-09-16 NOTE — Progress Notes (Signed)
DISCHARGE NOTE HOME [facility]NA [place] to be discharged Home per MD order. Discussed prescriptions and follow up appointments with the patient. Prescriptions given to patient; medication list explained in detail. Patient verbalized understanding.  Skin clean, dry and intact without evidence of skin break down, no evidence of skin tears noted. IV catheter discontinued intact. Site without signs and symptoms of complications. Dressing and pressure applied. Pt denies pain at the site currently. No complaints noted.  R HD catheter in chest.   An After Visit Summary (AVS) was printed and given to the patient. Patient escorted via wheelchair, and discharged home via private auto.  Irwin Brakeman, RN

## 2023-09-16 NOTE — Discharge Instructions (Addendum)
You were hospitalized for a condition called dry gangrene that has affected primarily your left foot.    Vascular surgery recommended amputation but thought a second opinion would be helpful.  Podiatry assessed you and will attempt debridement on 1/22 in an attempt to salvage your foot.    Unfortunately, there are no guarantees that this will solve the problem.  You will also be continued on antibiotics which will be administered on dialysis days.    Please follow-up with your PCP and also call to make an appointment with Dr. Ninetta Lights who is an infectious disease specialist (information is provided).   You will also get a call from podiatry to be seen by them in the clinic. If you do not get a call in the next few days. Call their office at [phone].  If was a pleasure taking care of you! We hope you do well!

## 2023-09-16 NOTE — Progress Notes (Addendum)
  Progress Note    09/16/2023 10:59 AM * No surgery found *  Subjective:  no new issues  Vitals:   09/16/23 1030 09/16/23 1045  BP: (!) 81/60 (S) (!) 87/67  Pulse: 64 66  Resp: 18 14  Temp:    SpO2: 97% 98%    Physical Exam: Resting on dialysis Bilateral dorsalis pedis pulses are palpable Edema in the bilateral ankles and feet are improved from previous examination, there is stable ulceration of the left toes  CBC    Component Value Date/Time   WBC 6.1 09/16/2023 0047   RBC 3.43 (L) 09/16/2023 0047   HGB 10.4 (L) 09/16/2023 0047   HCT 32.5 (L) 09/16/2023 0047   PLT 177 09/16/2023 0047   MCV 94.8 09/16/2023 0047   MCH 30.3 09/16/2023 0047   MCHC 32.0 09/16/2023 0047   RDW 15.9 (H) 09/16/2023 0047   LYMPHSABS 1.0 09/12/2023 2350   MONOABS 0.7 09/12/2023 2350   EOSABS 0.1 09/12/2023 2350   BASOSABS 0.1 09/12/2023 2350    BMET    Component Value Date/Time   NA 134 (L) 09/16/2023 0047   K 5.0 09/16/2023 0047   CL 94 (L) 09/16/2023 0047   CO2 24 09/16/2023 0047   GLUCOSE 72 09/16/2023 0047   BUN 39 (H) 09/16/2023 0047   CREATININE 9.04 (H) 09/16/2023 0047   CALCIUM 8.4 (L) 09/16/2023 0047   GFRNONAA 6 (L) 09/16/2023 0047   GFRAA 8 (L) 11/11/2018 0432    INR    Component Value Date/Time   INR 1.2 09/06/2023 0853     Intake/Output Summary (Last 24 hours) at 09/16/2023 1059 Last data filed at 09/16/2023 0757 Gross per 24 hour  Intake 300 ml  Output --  Net 300 ml     Assessment/plan:  63 y.o. male is here with continued ulceration of his left toes.  He he has optimal flow from a vascular standpoint although the microvascular flow is likely compromised due to end-stage renal disease.  I have previously discussed with the patient's family that he is high risk for healing a transmetatarsal amputation and this appears to be the sentiment of podiatry as well and they are going to proceed with wound care alone.  Certainly if wound care fails transmetatarsal  amputation would be the neck step again with high risk of requiring above-knee amputation.  Vascular surgery will be available as necessary.  Ultimately when he recovers he will need consideration of right upper extremity fistula versus graft but would wait until all infectious issues have resolved.   Aalliyah Kilker C. Randie Heinz, MD [facility] Office: [phone] Pager: [phone]  09/16/2023 10:59 AM

## 2023-09-17 ENCOUNTER — Ambulatory Visit (HOSPITAL_BASED_OUTPATIENT_CLINIC_OR_DEPARTMENT_OTHER): Payer: Medicare Other | Admitting: Internal Medicine

## 2023-09-17 ENCOUNTER — Telehealth: Payer: Self-pay | Admitting: Nephrology

## 2023-09-17 LAB — CULTURE, BLOOD (ROUTINE X 2): Culture: NO GROWTH

## 2023-09-17 NOTE — Discharge Planning (Signed)
[name] Patient Discharge Orders- [facility] CLINIC: [facility]  Patient's name: [name] Admit/DC Dates: 09/12/2023 - 09/16/2023  Discharge Diagnoses: L foot gangrene -conservative care for now   Recent Labs  Lab 09/16/23 0047  HGB 10.4*  K 5.0  CALCIUM 8.4*  PHOS 5.7*  ALBUMIN 2.3*   Aranesp: Given: --   Date of last dose/amount: --   PRBC's Given: -- Date/# of units: -- Mircera: Per protocol  Outpatient Dialysis Orders:  -Heparin: None  -EDW: No change   -Bath: No change  Access intervention/Change: --  IV Antibiotics:  -Vancomycin 1g IV q HD until 10/07/23  for L foot wound/gangrene -Ceftazidime 1g IV q HD until 10/07/23  for L foot wound/gangrene   OTHER/APPTS    Completed by: Tomasa Blase PA-C   D/C Meds to be reconciled by nurse after every discharge.    Reviewed by: MD:______ RN_______

## 2023-09-17 NOTE — Telephone Encounter (Signed)
Transition of Care - Initial Contact from Inpatient Facility  Date of discharge: 09/16/23 Date of contact: 09/17/23 Method: Phone Spoke to: Patient's spouse  Patient contacted to discuss transition of care from recent inpatient hospitalization. Patient was admitted to [facility] from 1/16 -09/16/23  with discharge diagnosis of left foot gangrene  The discharge medication list was reviewed.   Patient will return to his/her outpatient HD unit on: Wednesday 1/22   No other concerns at this time.

## 2023-09-18 ENCOUNTER — Encounter: Payer: Self-pay | Admitting: Podiatry

## 2023-09-18 ENCOUNTER — Ambulatory Visit (INDEPENDENT_AMBULATORY_CARE_PROVIDER_SITE_OTHER): Payer: Medicare Other | Admitting: Podiatry

## 2023-09-18 ENCOUNTER — Ambulatory Visit: Payer: Medicare Other | Admitting: Vascular Surgery

## 2023-09-18 VITALS — Ht 79.0 in | Wt 266.8 lb

## 2023-09-18 DIAGNOSIS — L97522 Non-pressure chronic ulcer of other part of left foot with fat layer exposed: Secondary | ICD-10-CM

## 2023-09-18 DIAGNOSIS — Z992 Dependence on renal dialysis: Secondary | ICD-10-CM | POA: Diagnosis not present

## 2023-09-18 DIAGNOSIS — D631 Anemia in chronic kidney disease: Secondary | ICD-10-CM | POA: Diagnosis not present

## 2023-09-18 DIAGNOSIS — E119 Type 2 diabetes mellitus without complications: Secondary | ICD-10-CM | POA: Diagnosis not present

## 2023-09-18 DIAGNOSIS — D509 Iron deficiency anemia, unspecified: Secondary | ICD-10-CM | POA: Diagnosis not present

## 2023-09-18 DIAGNOSIS — N2581 Secondary hyperparathyroidism of renal origin: Secondary | ICD-10-CM | POA: Diagnosis not present

## 2023-09-18 DIAGNOSIS — I96 Gangrene, not elsewhere classified: Secondary | ICD-10-CM | POA: Diagnosis not present

## 2023-09-18 DIAGNOSIS — N186 End stage renal disease: Secondary | ICD-10-CM | POA: Diagnosis not present

## 2023-09-18 DIAGNOSIS — Z5181 Encounter for therapeutic drug level monitoring: Secondary | ICD-10-CM | POA: Diagnosis not present

## 2023-09-18 LAB — CULTURE, BLOOD (ROUTINE X 2): Culture: NO GROWTH

## 2023-09-18 NOTE — Progress Notes (Signed)
Chief Complaint  Patient presents with   Wound Check    Pt is here to f/u after hospitalization due to left foot toes    Subjective:  63 y.o. male with PMHx of diabetes mellitus, ESRD on dialysis, CHF, nonambulatory in a wheelchair presenting today for outpatient follow-up of ulcers to the left toes.  Recently discharged from the hospital.  Presenting for outpatient follow-up and wound care   Past Medical History:  Diagnosis Date   Anxiety    Arthritis    Atrial fibrillation (HCC)    CHF (congestive heart failure), NYHA class III (HCC)    s/p heart transplant   Chronic kidney disease    end stage   Chronic systolic dysfunction of left ventricle    CVA (cerebral infarction)    Dementia (HCC)    Depression    Diabetes mellitus    PMH; Prior to heart transplant   Family history of coronary artery disease    in both parents   GERD (gastroesophageal reflux disease)    Hyperlipidemia    Hypertension    Left bundle branch block    Morbid obesity (HCC)    status post lap band   Myocardial infarction [facility])    prior to heart transplant   Nonischemic cardiomyopathy (HCC)    prior to heart transplant   Obesity (BMI 30-39.9)    Obstructive sleep apnea    no longer needs CPAP after heart transplant per pt   Premature ventricular contractions    Prostate cancer (HCC)    SOB (shortness of breath)     Past Surgical History:  Procedure Laterality Date   A/V FISTULAGRAM Right 05/23/2023   Procedure: A/V Fistulagram;  Surgeon: Victorino Sparrow, MD;  Location: [facility] INVASIVE CV LAB;  Service: Cardiovascular;  Laterality: Right;   AV FISTULA PLACEMENT Right 07/04/2023   Procedure: ARTERIOVENOUS (AV) FISTULA  REVISION WITH APPLICATION OF ARTEGRAFT RIGHT ARM;  Surgeon: Maeola Harman, MD;  Location: [facility] OR;  Service: Vascular;  Laterality: Right;   CARDIAC PACEMAKER PLACEMENT  09/21/2009   Biventricular implantable cardioverter-defibrillator implantation      COLONOSCOPY W/  BIOPSIES AND POLYPECTOMY     CYSTOSCOPY WITH FULGERATION N/A 04/27/2021   Procedure: CYSTOSCOPY AND CLOT EVACUATION WITH BLADDER BIOPSY/ FUGARATION OF BLADDER/ FULGARATION OF PROSTATE ;  Surgeon: Jerilee Field, MD;  Location: [facility] ORS;  Service: Urology;  Laterality: N/A;   FOOT SURGERY     left   HEART TRANSPLANT  2014   INSERTION OF DIALYSIS CATHETER Right 07/04/2023   Procedure: INSERTION OF DIALYSIS CATHETER WITH 19 CM PALINDROME;  Surgeon: Maeola Harman, MD;  Location: [facility] OR;  Service: Vascular;  Laterality: Right;   LAPAROSCOPIC GASTRIC BANDING  01/27/2007   LEFT VENTRICULAR ASSIST DEVICE     implanted at [facility]   PACEMAKER REMOVAL     PROSTATE BIOPSY     TOTAL HIP ARTHROPLASTY Right 07/22/2017   Procedure: RIGHT TOTAL HIP ARTHROPLASTY ANTERIOR APPROACH;  Surgeon: Samson Frederic, MD;  Location: [facility] OR;  Service: Orthopedics;  Laterality: Right;  Needs RNFA   TOTAL KNEE ARTHROPLASTY Right 07/22/2017    Allergies  Allergen Reactions   Heparin Nausea Only, Swelling and Other (See Comments)    * * HIT * *  SWELLING REACTION UNSPECIFIED   DIAPHORESIS    Iodinated Contrast Media     Unknown reaction   Penicillin G Potassium [Penicillin G] Nausea Only and Other (See Comments)    DIAPHORESIS High Doses  Objective/Physical Exam General: The patient is alert and oriented x3 in no acute distress.  Dermatology:  Ulcers noted encompassing the digits of the left foot.  In total the wound measures approximately 2.0 x 5.0 x 0.2 cm.  Granular wound base.  Currently there is no exposed bone muscle tendon ligament or joint.  There is a significant amount of slough fibrin and necrotic tissue and dead skin around the toes.  No malodor or purulence today.  Vascular: Chronic edema bilateral lower extremities VAS [place] ABI WITH/WO TBI 09/06/2023 ABI Findings:  +---------+------------------+-----+---------+----------------------+  Right   Rt Pressure (mmHg)IndexWaveform Comment                  +---------+------------------+-----+---------+----------------------+  Brachial 101                    biphasic HX of HD access in RUE  +---------+------------------+-----+---------+----------------------+  PTA                            triphasicnon compressible        +---------+------------------+-----+---------+----------------------+  DP                             triphasicnon compressible        +---------+------------------+-----+---------+----------------------+  Great Toe73                0.67 Normal                           +---------+------------------+-----+---------+----------------------+   +---------+------------------+-----+---------+-------+  Left    Lt Pressure (mmHg)IndexWaveform Comment  +---------+------------------+-----+---------+-------+  Brachial 109                    triphasic         +---------+------------------+-----+---------+-------+  PTA                            triphasic         +---------+------------------+-----+---------+-------+  DP                             triphasic         +---------+------------------+-----+---------+-------+  Great Toe75                0.69 Normal            +---------+------------------+-----+---------+-------+  Arterial wall calcification precludes accurate ankle pressures and ABIs.  Summary:  Right: Resting right ankle-brachial index indicates noncompressible right  lower extremity arteries. The right toe-brachial index is mildly reduced.  Left: Resting left ankle-brachial index indicates noncompressible left  lower extremity arteries. The left toe-brachial index is mildly reduced.   Neurological: Light touch and protective threshold diminished bilaterally.   Musculoskeletal Exam: Range of motion within normal limits to all pedal and ankle joints bilateral. Muscle strength 5/5 in all groups bilateral.   Assessment: 1. Ulcers left toes secondary  to diabetes mellitus 2. diabetes mellitus w/ peripheral neuropathy 3.  Chronic edema bilateral lower extremities   Plan of Care:  -Patient was evaluated.  Comprehensive diabetic foot exam performed today -Medically necessary excisional debridement including subcutaneous tissue was performed using a tissue nipper and a chisel blade. Excisional debridement of all the necrotic nonviable tissue down to healthy bleeding viable tissue was performed with post-debridement measurements same  as pre-. -The wound was cleansed and Betadine wet-to-dry dressings applied.  Leave clean dry and intact x 1 week -Continue postop shoes -Return to clinic 1 week. Will  Felecia Shelling, DPM [facility]  Dr. Felecia Shelling, DPM    [address] [place], [place] [address]                Office [phone]  Fax [phone]

## 2023-09-19 DIAGNOSIS — G894 Chronic pain syndrome: Secondary | ICD-10-CM | POA: Diagnosis not present

## 2023-09-19 DIAGNOSIS — N186 End stage renal disease: Secondary | ICD-10-CM | POA: Diagnosis not present

## 2023-09-19 DIAGNOSIS — D631 Anemia in chronic kidney disease: Secondary | ICD-10-CM | POA: Diagnosis not present

## 2023-09-19 DIAGNOSIS — E1122 Type 2 diabetes mellitus with diabetic chronic kidney disease: Secondary | ICD-10-CM | POA: Diagnosis not present

## 2023-09-19 DIAGNOSIS — I132 Hypertensive heart and chronic kidney disease with heart failure and with stage 5 chronic kidney disease, or end stage renal disease: Secondary | ICD-10-CM | POA: Diagnosis not present

## 2023-09-19 DIAGNOSIS — I509 Heart failure, unspecified: Secondary | ICD-10-CM | POA: Diagnosis not present

## 2023-09-19 DIAGNOSIS — I6350 Cerebral infarction due to unspecified occlusion or stenosis of unspecified cerebral artery: Secondary | ICD-10-CM | POA: Diagnosis not present

## 2023-09-19 DIAGNOSIS — I420 Dilated cardiomyopathy: Secondary | ICD-10-CM | POA: Diagnosis not present

## 2023-09-20 DIAGNOSIS — I509 Heart failure, unspecified: Secondary | ICD-10-CM | POA: Diagnosis not present

## 2023-09-20 DIAGNOSIS — I132 Hypertensive heart and chronic kidney disease with heart failure and with stage 5 chronic kidney disease, or end stage renal disease: Secondary | ICD-10-CM | POA: Diagnosis not present

## 2023-09-20 DIAGNOSIS — Z992 Dependence on renal dialysis: Secondary | ICD-10-CM | POA: Diagnosis not present

## 2023-09-20 DIAGNOSIS — N2581 Secondary hyperparathyroidism of renal origin: Secondary | ICD-10-CM | POA: Diagnosis not present

## 2023-09-20 DIAGNOSIS — N186 End stage renal disease: Secondary | ICD-10-CM | POA: Diagnosis not present

## 2023-09-20 DIAGNOSIS — I420 Dilated cardiomyopathy: Secondary | ICD-10-CM | POA: Diagnosis not present

## 2023-09-20 DIAGNOSIS — I96 Gangrene, not elsewhere classified: Secondary | ICD-10-CM | POA: Diagnosis not present

## 2023-09-20 DIAGNOSIS — E1122 Type 2 diabetes mellitus with diabetic chronic kidney disease: Secondary | ICD-10-CM | POA: Diagnosis not present

## 2023-09-20 DIAGNOSIS — D509 Iron deficiency anemia, unspecified: Secondary | ICD-10-CM | POA: Diagnosis not present

## 2023-09-20 DIAGNOSIS — D631 Anemia in chronic kidney disease: Secondary | ICD-10-CM | POA: Diagnosis not present

## 2023-09-23 DIAGNOSIS — N2581 Secondary hyperparathyroidism of renal origin: Secondary | ICD-10-CM | POA: Diagnosis not present

## 2023-09-23 DIAGNOSIS — D509 Iron deficiency anemia, unspecified: Secondary | ICD-10-CM | POA: Diagnosis not present

## 2023-09-23 DIAGNOSIS — N186 End stage renal disease: Secondary | ICD-10-CM | POA: Diagnosis not present

## 2023-09-23 DIAGNOSIS — Z992 Dependence on renal dialysis: Secondary | ICD-10-CM | POA: Diagnosis not present

## 2023-09-23 DIAGNOSIS — D631 Anemia in chronic kidney disease: Secondary | ICD-10-CM | POA: Diagnosis not present

## 2023-09-23 DIAGNOSIS — I96 Gangrene, not elsewhere classified: Secondary | ICD-10-CM | POA: Diagnosis not present

## 2023-09-24 DIAGNOSIS — N186 End stage renal disease: Secondary | ICD-10-CM | POA: Diagnosis not present

## 2023-09-24 DIAGNOSIS — I420 Dilated cardiomyopathy: Secondary | ICD-10-CM | POA: Diagnosis not present

## 2023-09-24 DIAGNOSIS — I132 Hypertensive heart and chronic kidney disease with heart failure and with stage 5 chronic kidney disease, or end stage renal disease: Secondary | ICD-10-CM | POA: Diagnosis not present

## 2023-09-24 DIAGNOSIS — D631 Anemia in chronic kidney disease: Secondary | ICD-10-CM | POA: Diagnosis not present

## 2023-09-24 DIAGNOSIS — E1122 Type 2 diabetes mellitus with diabetic chronic kidney disease: Secondary | ICD-10-CM | POA: Diagnosis not present

## 2023-09-24 DIAGNOSIS — I509 Heart failure, unspecified: Secondary | ICD-10-CM | POA: Diagnosis not present

## 2023-09-25 ENCOUNTER — Ambulatory Visit: Payer: Medicare Other | Admitting: Podiatry

## 2023-09-25 ENCOUNTER — Encounter: Payer: Self-pay | Admitting: Podiatry

## 2023-09-25 ENCOUNTER — Ambulatory Visit (INDEPENDENT_AMBULATORY_CARE_PROVIDER_SITE_OTHER): Payer: Medicare Other | Admitting: Podiatry

## 2023-09-25 DIAGNOSIS — Z992 Dependence on renal dialysis: Secondary | ICD-10-CM | POA: Diagnosis not present

## 2023-09-25 DIAGNOSIS — Z5181 Encounter for therapeutic drug level monitoring: Secondary | ICD-10-CM | POA: Diagnosis not present

## 2023-09-25 DIAGNOSIS — I96 Gangrene, not elsewhere classified: Secondary | ICD-10-CM | POA: Diagnosis not present

## 2023-09-25 DIAGNOSIS — N2581 Secondary hyperparathyroidism of renal origin: Secondary | ICD-10-CM | POA: Diagnosis not present

## 2023-09-25 DIAGNOSIS — N186 End stage renal disease: Secondary | ICD-10-CM | POA: Diagnosis not present

## 2023-09-25 DIAGNOSIS — L97522 Non-pressure chronic ulcer of other part of left foot with fat layer exposed: Secondary | ICD-10-CM | POA: Diagnosis not present

## 2023-09-25 DIAGNOSIS — D631 Anemia in chronic kidney disease: Secondary | ICD-10-CM | POA: Diagnosis not present

## 2023-09-25 DIAGNOSIS — D509 Iron deficiency anemia, unspecified: Secondary | ICD-10-CM | POA: Diagnosis not present

## 2023-09-25 NOTE — Progress Notes (Signed)
Chief Complaint  Patient presents with   Wound Check    "It's doing alright."    Subjective:  63 y.o. male with PMHx of diabetes mellitus, ESRD on dialysis, CHF, nonambulatory in a wheelchair presenting today for outpatient follow-up of ulcers to the left toes.  Recently discharged from the hospital.  Last visit on 09/18/23 the toe wounds were debrided and Betadine with dry sterile dressings were applied.  Left clean dry and intact over the past week.  No new complaints   Past Medical History:  Diagnosis Date   Anxiety    Arthritis    Atrial fibrillation (HCC)    CHF (congestive heart failure), NYHA class III (HCC)    s/p heart transplant   Chronic kidney disease    end stage   Chronic systolic dysfunction of left ventricle    CVA (cerebral infarction)    Dementia (HCC)    Depression    Diabetes mellitus    PMH; Prior to heart transplant   Family history of coronary artery disease    in both parents   GERD (gastroesophageal reflux disease)    Hyperlipidemia    Hypertension    Left bundle branch block    Morbid obesity (HCC)    status post lap band   Myocardial infarction [facility])    prior to heart transplant   Nonischemic cardiomyopathy (HCC)    prior to heart transplant   Obesity (BMI 30-39.9)    Obstructive sleep apnea    no longer needs CPAP after heart transplant per pt   Premature ventricular contractions    Prostate cancer (HCC)    SOB (shortness of breath)     Past Surgical History:  Procedure Laterality Date   A/V FISTULAGRAM Right 05/23/2023   Procedure: A/V Fistulagram;  Surgeon: Victorino Sparrow, MD;  Location: [facility] INVASIVE CV LAB;  Service: Cardiovascular;  Laterality: Right;   AV FISTULA PLACEMENT Right 07/04/2023   Procedure: ARTERIOVENOUS (AV) FISTULA  REVISION WITH APPLICATION OF ARTEGRAFT RIGHT ARM;  Surgeon: Maeola Harman, MD;  Location: [facility] OR;  Service: Vascular;  Laterality: Right;   CARDIAC PACEMAKER PLACEMENT  09/21/2009    Biventricular implantable cardioverter-defibrillator implantation      COLONOSCOPY W/ BIOPSIES AND POLYPECTOMY     CYSTOSCOPY WITH FULGERATION N/A 04/27/2021   Procedure: CYSTOSCOPY AND CLOT EVACUATION WITH BLADDER BIOPSY/ FUGARATION OF BLADDER/ FULGARATION OF PROSTATE ;  Surgeon: Jerilee Field, MD;  Location: [facility] ORS;  Service: Urology;  Laterality: N/A;   FOOT SURGERY     left   HEART TRANSPLANT  2014   INSERTION OF DIALYSIS CATHETER Right 07/04/2023   Procedure: INSERTION OF DIALYSIS CATHETER WITH 19 CM PALINDROME;  Surgeon: Maeola Harman, MD;  Location: [facility] OR;  Service: Vascular;  Laterality: Right;   LAPAROSCOPIC GASTRIC BANDING  01/27/2007   LEFT VENTRICULAR ASSIST DEVICE     implanted at [facility]   PACEMAKER REMOVAL     PROSTATE BIOPSY     TOTAL HIP ARTHROPLASTY Right 07/22/2017   Procedure: RIGHT TOTAL HIP ARTHROPLASTY ANTERIOR APPROACH;  Surgeon: Samson Frederic, MD;  Location: [facility] OR;  Service: Orthopedics;  Laterality: Right;  Needs RNFA   TOTAL KNEE ARTHROPLASTY Right 07/22/2017    Allergies  Allergen Reactions   Heparin Nausea Only, Swelling and Other (See Comments)    * * HIT * *  SWELLING REACTION UNSPECIFIED   DIAPHORESIS    Iodinated Contrast Media     Unknown reaction   Penicillin G Potassium [Penicillin G]  Nausea Only and Other (See Comments)    DIAPHORESIS High Doses     Objective/Physical Exam General: The patient is alert and oriented x3 in no acute distress.  Dermatology:  Significant improvement of the ulcers noted.  No drainage noted with removal of the dressings.  The wounds have resolved mostly with exception of a few wounds throughout the toes.  Overall significant improvement.  Vascular: Chronic edema bilateral lower extremities VAS [place] ABI WITH/WO TBI 09/06/2023 ABI Findings:  +---------+------------------+-----+---------+----------------------+  Right   Rt Pressure (mmHg)IndexWaveform Comment                  +---------+------------------+-----+---------+----------------------+  Brachial 101                    biphasic HX of HD access in RUE  +---------+------------------+-----+---------+----------------------+  PTA                            triphasicnon compressible        +---------+------------------+-----+---------+----------------------+  DP                             triphasicnon compressible        +---------+------------------+-----+---------+----------------------+  Great Toe73                0.67 Normal                           +---------+------------------+-----+---------+----------------------+   +---------+------------------+-----+---------+-------+  Left    Lt Pressure (mmHg)IndexWaveform Comment  +---------+------------------+-----+---------+-------+  Brachial 109                    triphasic         +---------+------------------+-----+---------+-------+  PTA                            triphasic         +---------+------------------+-----+---------+-------+  DP                             triphasic         +---------+------------------+-----+---------+-------+  Great Toe75                0.69 Normal            +---------+------------------+-----+---------+-------+  Arterial wall calcification precludes accurate ankle pressures and ABIs.  Summary:  Right: Resting right ankle-brachial index indicates noncompressible right  lower extremity arteries. The right toe-brachial index is mildly reduced.  Left: Resting left ankle-brachial index indicates noncompressible left  lower extremity arteries. The left toe-brachial index is mildly reduced.   Neurological: Light touch and protective threshold diminished bilaterally.   Musculoskeletal Exam: Range of motion within normal limits to all pedal and ankle joints bilateral. Muscle strength 5/5 in all groups bilateral.  Patient nonambulatory in wheelchair.  No prior  amputations  Assessment: 1. Ulcers left toes secondary to diabetes mellitus 2. diabetes mellitus w/ peripheral neuropathy 3.  Chronic edema bilateral lower extremities   Plan of Care:  -Patient was evaluated.   -Medically necessary excisional debridement including subcutaneous tissue was performed using a tissue nipper and a chisel blade. Excisional debridement of all the necrotic nonviable tissue down to healthy bleeding viable tissue was performed with post-debridement measurements same as pre-. - The regimen of leaving the dressings  clean dry and intact for the week has helped significantly with the wounds and the family is okay to leave them intact for an additional week.  The wound was cleansed and Betadine wet-to-dry dressings applied.  Leave clean dry and intact x 1 week -Continue postop shoes -Return to clinic 1 week.   Felecia Shelling, DPM [facility]  Dr. Felecia Shelling, DPM    [address] [place], [place] [address]                Office [phone]  Fax [phone]

## 2023-09-26 DIAGNOSIS — D631 Anemia in chronic kidney disease: Secondary | ICD-10-CM | POA: Diagnosis not present

## 2023-09-26 DIAGNOSIS — I509 Heart failure, unspecified: Secondary | ICD-10-CM | POA: Diagnosis not present

## 2023-09-26 DIAGNOSIS — I420 Dilated cardiomyopathy: Secondary | ICD-10-CM | POA: Diagnosis not present

## 2023-09-26 DIAGNOSIS — E1122 Type 2 diabetes mellitus with diabetic chronic kidney disease: Secondary | ICD-10-CM | POA: Diagnosis not present

## 2023-09-26 DIAGNOSIS — N186 End stage renal disease: Secondary | ICD-10-CM | POA: Diagnosis not present

## 2023-09-26 DIAGNOSIS — I132 Hypertensive heart and chronic kidney disease with heart failure and with stage 5 chronic kidney disease, or end stage renal disease: Secondary | ICD-10-CM | POA: Diagnosis not present

## 2023-09-27 DIAGNOSIS — E1122 Type 2 diabetes mellitus with diabetic chronic kidney disease: Secondary | ICD-10-CM | POA: Diagnosis not present

## 2023-09-27 DIAGNOSIS — Z992 Dependence on renal dialysis: Secondary | ICD-10-CM | POA: Diagnosis not present

## 2023-09-27 DIAGNOSIS — I96 Gangrene, not elsewhere classified: Secondary | ICD-10-CM | POA: Diagnosis not present

## 2023-09-27 DIAGNOSIS — D509 Iron deficiency anemia, unspecified: Secondary | ICD-10-CM | POA: Diagnosis not present

## 2023-09-27 DIAGNOSIS — I132 Hypertensive heart and chronic kidney disease with heart failure and with stage 5 chronic kidney disease, or end stage renal disease: Secondary | ICD-10-CM | POA: Diagnosis not present

## 2023-09-27 DIAGNOSIS — N2581 Secondary hyperparathyroidism of renal origin: Secondary | ICD-10-CM | POA: Diagnosis not present

## 2023-09-27 DIAGNOSIS — I509 Heart failure, unspecified: Secondary | ICD-10-CM | POA: Diagnosis not present

## 2023-09-27 DIAGNOSIS — D631 Anemia in chronic kidney disease: Secondary | ICD-10-CM | POA: Diagnosis not present

## 2023-09-27 DIAGNOSIS — I420 Dilated cardiomyopathy: Secondary | ICD-10-CM | POA: Diagnosis not present

## 2023-09-27 DIAGNOSIS — N186 End stage renal disease: Secondary | ICD-10-CM | POA: Diagnosis not present

## 2023-09-28 DIAGNOSIS — N186 End stage renal disease: Secondary | ICD-10-CM | POA: Diagnosis not present

## 2023-09-28 DIAGNOSIS — Z992 Dependence on renal dialysis: Secondary | ICD-10-CM | POA: Diagnosis not present

## 2023-09-28 DIAGNOSIS — F02C0 Dementia in other diseases classified elsewhere, severe, without behavioral disturbance, psychotic disturbance, mood disturbance, and anxiety: Secondary | ICD-10-CM | POA: Diagnosis not present

## 2023-09-28 DIAGNOSIS — T8620 Unspecified complication of heart transplant: Secondary | ICD-10-CM | POA: Diagnosis not present

## 2023-09-30 DIAGNOSIS — Z992 Dependence on renal dialysis: Secondary | ICD-10-CM | POA: Diagnosis not present

## 2023-09-30 DIAGNOSIS — N2581 Secondary hyperparathyroidism of renal origin: Secondary | ICD-10-CM | POA: Diagnosis not present

## 2023-09-30 DIAGNOSIS — D509 Iron deficiency anemia, unspecified: Secondary | ICD-10-CM | POA: Diagnosis not present

## 2023-09-30 DIAGNOSIS — I96 Gangrene, not elsewhere classified: Secondary | ICD-10-CM | POA: Diagnosis not present

## 2023-09-30 DIAGNOSIS — N186 End stage renal disease: Secondary | ICD-10-CM | POA: Diagnosis not present

## 2023-09-30 DIAGNOSIS — E1129 Type 2 diabetes mellitus with other diabetic kidney complication: Secondary | ICD-10-CM | POA: Diagnosis not present

## 2023-09-30 DIAGNOSIS — D631 Anemia in chronic kidney disease: Secondary | ICD-10-CM | POA: Diagnosis not present

## 2023-10-01 DIAGNOSIS — G894 Chronic pain syndrome: Secondary | ICD-10-CM | POA: Diagnosis not present

## 2023-10-01 DIAGNOSIS — I447 Left bundle-branch block, unspecified: Secondary | ICD-10-CM | POA: Diagnosis not present

## 2023-10-01 DIAGNOSIS — N186 End stage renal disease: Secondary | ICD-10-CM | POA: Diagnosis not present

## 2023-10-01 DIAGNOSIS — I96 Gangrene, not elsewhere classified: Secondary | ICD-10-CM | POA: Diagnosis not present

## 2023-10-01 DIAGNOSIS — I951 Orthostatic hypotension: Secondary | ICD-10-CM | POA: Diagnosis not present

## 2023-10-01 DIAGNOSIS — Z6833 Body mass index (BMI) 33.0-33.9, adult: Secondary | ICD-10-CM | POA: Diagnosis not present

## 2023-10-01 DIAGNOSIS — D631 Anemia in chronic kidney disease: Secondary | ICD-10-CM | POA: Diagnosis not present

## 2023-10-01 DIAGNOSIS — I252 Old myocardial infarction: Secondary | ICD-10-CM | POA: Diagnosis not present

## 2023-10-01 DIAGNOSIS — F32A Depression, unspecified: Secondary | ICD-10-CM | POA: Diagnosis not present

## 2023-10-01 DIAGNOSIS — I493 Ventricular premature depolarization: Secondary | ICD-10-CM | POA: Diagnosis not present

## 2023-10-01 DIAGNOSIS — E7800 Pure hypercholesterolemia, unspecified: Secondary | ICD-10-CM | POA: Diagnosis not present

## 2023-10-01 DIAGNOSIS — E1122 Type 2 diabetes mellitus with diabetic chronic kidney disease: Secondary | ICD-10-CM | POA: Diagnosis not present

## 2023-10-01 DIAGNOSIS — I2510 Atherosclerotic heart disease of native coronary artery without angina pectoris: Secondary | ICD-10-CM | POA: Diagnosis not present

## 2023-10-01 DIAGNOSIS — F02818 Dementia in other diseases classified elsewhere, unspecified severity, with other behavioral disturbance: Secondary | ICD-10-CM | POA: Diagnosis not present

## 2023-10-01 DIAGNOSIS — M1990 Unspecified osteoarthritis, unspecified site: Secondary | ICD-10-CM | POA: Diagnosis not present

## 2023-10-01 DIAGNOSIS — G4733 Obstructive sleep apnea (adult) (pediatric): Secondary | ICD-10-CM | POA: Diagnosis not present

## 2023-10-01 DIAGNOSIS — G20C Parkinsonism, unspecified: Secondary | ICD-10-CM | POA: Diagnosis not present

## 2023-10-01 DIAGNOSIS — E1142 Type 2 diabetes mellitus with diabetic polyneuropathy: Secondary | ICD-10-CM | POA: Diagnosis not present

## 2023-10-01 DIAGNOSIS — I509 Heart failure, unspecified: Secondary | ICD-10-CM | POA: Diagnosis not present

## 2023-10-01 DIAGNOSIS — I132 Hypertensive heart and chronic kidney disease with heart failure and with stage 5 chronic kidney disease, or end stage renal disease: Secondary | ICD-10-CM | POA: Diagnosis not present

## 2023-10-01 DIAGNOSIS — I4891 Unspecified atrial fibrillation: Secondary | ICD-10-CM | POA: Diagnosis not present

## 2023-10-01 DIAGNOSIS — I420 Dilated cardiomyopathy: Secondary | ICD-10-CM | POA: Diagnosis not present

## 2023-10-01 DIAGNOSIS — F0283 Dementia in other diseases classified elsewhere, unspecified severity, with mood disturbance: Secondary | ICD-10-CM | POA: Diagnosis not present

## 2023-10-02 ENCOUNTER — Encounter: Payer: Self-pay | Admitting: Podiatry

## 2023-10-02 ENCOUNTER — Ambulatory Visit (INDEPENDENT_AMBULATORY_CARE_PROVIDER_SITE_OTHER): Payer: Medicare Other | Admitting: Podiatry

## 2023-10-02 VITALS — Ht 79.0 in | Wt 266.8 lb

## 2023-10-02 DIAGNOSIS — Z5181 Encounter for therapeutic drug level monitoring: Secondary | ICD-10-CM | POA: Diagnosis not present

## 2023-10-02 DIAGNOSIS — L97522 Non-pressure chronic ulcer of other part of left foot with fat layer exposed: Secondary | ICD-10-CM

## 2023-10-02 DIAGNOSIS — N2581 Secondary hyperparathyroidism of renal origin: Secondary | ICD-10-CM | POA: Diagnosis not present

## 2023-10-02 DIAGNOSIS — Z992 Dependence on renal dialysis: Secondary | ICD-10-CM | POA: Diagnosis not present

## 2023-10-02 DIAGNOSIS — D631 Anemia in chronic kidney disease: Secondary | ICD-10-CM | POA: Diagnosis not present

## 2023-10-02 DIAGNOSIS — I96 Gangrene, not elsewhere classified: Secondary | ICD-10-CM | POA: Diagnosis not present

## 2023-10-02 DIAGNOSIS — N186 End stage renal disease: Secondary | ICD-10-CM | POA: Diagnosis not present

## 2023-10-02 DIAGNOSIS — E1129 Type 2 diabetes mellitus with other diabetic kidney complication: Secondary | ICD-10-CM | POA: Diagnosis not present

## 2023-10-02 MED ORDER — GENTAMICIN SULFATE 0.1 % EX CREA: 1.0000 | TOPICAL_CREAM | Freq: Two times a day (BID) | CUTANEOUS | 1 refills | Status: AC

## 2023-10-02 NOTE — Progress Notes (Signed)
 No chief complaint on file.   Subjective:  63 y.o. male with PMHx of diabetes mellitus, ESRD on dialysis, CHF, nonambulatory in a wheelchair presenting today for outpatient follow-up of ulcers to the left toes.  Recently discharged from the hospital.  Patient doing well.  They continue to notice significant improvement.  Minimally ambulatory mostly transfer purposes only  Past Medical History:  Diagnosis Date   Anxiety    Arthritis    Atrial fibrillation (HCC)    CHF (congestive heart failure), NYHA class III (HCC)    s/p heart transplant   Chronic kidney disease    end stage   Chronic systolic dysfunction of left ventricle    CVA (cerebral infarction)    Dementia (HCC)    Depression    Diabetes mellitus    PMH; Prior to heart transplant   Family history of coronary artery disease    in both parents   GERD (gastroesophageal reflux disease)    Hyperlipidemia    Hypertension    Left bundle branch block    Morbid obesity (HCC)    status post lap band   Myocardial infarction [facility])    prior to heart transplant   Nonischemic cardiomyopathy (HCC)    prior to heart transplant   Obesity (BMI 30-39.9)    Obstructive sleep apnea    no longer needs CPAP after heart transplant per pt   Premature ventricular contractions    Prostate cancer (HCC)    SOB (shortness of breath)     Past Surgical History:  Procedure Laterality Date   A/V FISTULAGRAM Right 05/23/2023   Procedure: A/V Fistulagram;  Surgeon: Lanis Fonda BRAVO, MD;  Location: [facility] INVASIVE CV LAB;  Service: Cardiovascular;  Laterality: Right;   AV FISTULA PLACEMENT Right 07/04/2023   Procedure: ARTERIOVENOUS (AV) FISTULA  REVISION WITH APPLICATION OF ARTEGRAFT RIGHT ARM;  Surgeon: Sheree Penne Bruckner, MD;  Location: [facility] OR;  Service: Vascular;  Laterality: Right;   CARDIAC PACEMAKER PLACEMENT  09/21/2009   Biventricular implantable cardioverter-defibrillator implantation      COLONOSCOPY W/ BIOPSIES AND POLYPECTOMY      CYSTOSCOPY WITH FULGERATION N/A 04/27/2021   Procedure: CYSTOSCOPY AND CLOT EVACUATION WITH BLADDER BIOPSY/ FUGARATION OF BLADDER/ FULGARATION OF PROSTATE ;  Surgeon: Nieves Cough, MD;  Location: [facility] ORS;  Service: Urology;  Laterality: N/A;   FOOT SURGERY     left   HEART TRANSPLANT  2014   INSERTION OF DIALYSIS CATHETER Right 07/04/2023   Procedure: INSERTION OF DIALYSIS CATHETER WITH 19 CM PALINDROME;  Surgeon: Sheree Penne Bruckner, MD;  Location: [facility] OR;  Service: Vascular;  Laterality: Right;   LAPAROSCOPIC GASTRIC BANDING  01/27/2007   LEFT VENTRICULAR ASSIST DEVICE     implanted at [facility]   PACEMAKER REMOVAL     PROSTATE BIOPSY     TOTAL HIP ARTHROPLASTY Right 07/22/2017   Procedure: RIGHT TOTAL HIP ARTHROPLASTY ANTERIOR APPROACH;  Surgeon: Fidel Rogue, MD;  Location: [facility] OR;  Service: Orthopedics;  Laterality: Right;  Needs RNFA   TOTAL KNEE ARTHROPLASTY Right 07/22/2017    Allergies  Allergen Reactions   Heparin  Nausea Only, Swelling and Other (See Comments)    * * HIT * *  SWELLING REACTION UNSPECIFIED   DIAPHORESIS    Iodinated Contrast Media     Unknown reaction   Penicillin G Potassium [Penicillin G] Nausea Only and Other (See Comments)    DIAPHORESIS High Doses    LT toes 10/02/2023  Objective/Physical Exam General: The patient is alert and  oriented x3 in no acute distress.  Dermatology:  Continued significant improvement of the ulcers noted.  No drainage noted with removal of the dressings.  The wounds have resolved mostly with exception of a few wounds throughout the toes.  Overall significant improvement.  Vascular: Chronic edema bilateral lower extremities VAS US  ABI WITH/WO TBI 09/06/2023 ABI Findings:  +---------+------------------+-----+---------+----------------------+  Right   Rt Pressure (mmHg)IndexWaveform Comment                 +---------+------------------+-----+---------+----------------------+  Brachial 101                     biphasic HX of HD access in RUE  +---------+------------------+-----+---------+----------------------+  PTA                            triphasicnon compressible        +---------+------------------+-----+---------+----------------------+  DP                             triphasicnon compressible        +---------+------------------+-----+---------+----------------------+  Great Toe73                0.67 Normal                           +---------+------------------+-----+---------+----------------------+   +---------+------------------+-----+---------+-------+  Left    Lt Pressure (mmHg)IndexWaveform Comment  +---------+------------------+-----+---------+-------+  Brachial 109                    triphasic         +---------+------------------+-----+---------+-------+  PTA                            triphasic         +---------+------------------+-----+---------+-------+  DP                             triphasic         +---------+------------------+-----+---------+-------+  Great Toe75                0.69 Normal            +---------+------------------+-----+---------+-------+  Arterial wall calcification precludes accurate ankle pressures and ABIs.  Summary:  Right: Resting right ankle-brachial index indicates noncompressible right  lower extremity arteries. The right toe-brachial index is mildly reduced.  Left: Resting left ankle-brachial index indicates noncompressible left  lower extremity arteries. The left toe-brachial index is mildly reduced.   Neurological: Light touch and protective threshold diminished bilaterally.   Musculoskeletal Exam: Range of motion within normal limits to all pedal and ankle joints bilateral. Muscle strength 5/5 in all groups bilateral.  Patient nonambulatory in wheelchair.  No prior amputations  Assessment: 1. Ulcers left toes secondary to diabetes mellitus 2. diabetes mellitus w/ peripheral  neuropathy 3.  Chronic edema bilateral lower extremities   Plan of Care:  -Patient was evaluated.  Overall significant improvement - Discontinue Betadine .  Prescription for gentamicin  cream.  Apply to the toes daily with a clean cotton sock.  No need for dressings -Okay to wash the foot antibacterial soap and water .  Recommend foot lotion proximal to the toes -Continue home health nurse visits 2x/week -Return to clinic 2 weeks  Thresa EMERSON Sar, DPM [facility]  Dr. Thresa EMERSON Sar,  DPM    [address] [place], [place] [address]                Office [phone]  Fax [phone]

## 2023-10-03 DIAGNOSIS — I96 Gangrene, not elsewhere classified: Secondary | ICD-10-CM | POA: Diagnosis not present

## 2023-10-03 DIAGNOSIS — I132 Hypertensive heart and chronic kidney disease with heart failure and with stage 5 chronic kidney disease, or end stage renal disease: Secondary | ICD-10-CM | POA: Diagnosis not present

## 2023-10-03 DIAGNOSIS — D631 Anemia in chronic kidney disease: Secondary | ICD-10-CM | POA: Diagnosis not present

## 2023-10-03 DIAGNOSIS — N186 End stage renal disease: Secondary | ICD-10-CM | POA: Diagnosis not present

## 2023-10-03 DIAGNOSIS — E1122 Type 2 diabetes mellitus with diabetic chronic kidney disease: Secondary | ICD-10-CM | POA: Diagnosis not present

## 2023-10-03 DIAGNOSIS — I509 Heart failure, unspecified: Secondary | ICD-10-CM | POA: Diagnosis not present

## 2023-10-04 DIAGNOSIS — D631 Anemia in chronic kidney disease: Secondary | ICD-10-CM | POA: Diagnosis not present

## 2023-10-04 DIAGNOSIS — N2581 Secondary hyperparathyroidism of renal origin: Secondary | ICD-10-CM | POA: Diagnosis not present

## 2023-10-04 DIAGNOSIS — I96 Gangrene, not elsewhere classified: Secondary | ICD-10-CM | POA: Diagnosis not present

## 2023-10-04 DIAGNOSIS — I132 Hypertensive heart and chronic kidney disease with heart failure and with stage 5 chronic kidney disease, or end stage renal disease: Secondary | ICD-10-CM | POA: Diagnosis not present

## 2023-10-04 DIAGNOSIS — I509 Heart failure, unspecified: Secondary | ICD-10-CM | POA: Diagnosis not present

## 2023-10-04 DIAGNOSIS — N186 End stage renal disease: Secondary | ICD-10-CM | POA: Diagnosis not present

## 2023-10-04 DIAGNOSIS — Z992 Dependence on renal dialysis: Secondary | ICD-10-CM | POA: Diagnosis not present

## 2023-10-04 DIAGNOSIS — E1122 Type 2 diabetes mellitus with diabetic chronic kidney disease: Secondary | ICD-10-CM | POA: Diagnosis not present

## 2023-10-04 DIAGNOSIS — E1129 Type 2 diabetes mellitus with other diabetic kidney complication: Secondary | ICD-10-CM | POA: Diagnosis not present

## 2023-10-07 DIAGNOSIS — D631 Anemia in chronic kidney disease: Secondary | ICD-10-CM | POA: Diagnosis not present

## 2023-10-07 DIAGNOSIS — E1129 Type 2 diabetes mellitus with other diabetic kidney complication: Secondary | ICD-10-CM | POA: Diagnosis not present

## 2023-10-07 DIAGNOSIS — I96 Gangrene, not elsewhere classified: Secondary | ICD-10-CM | POA: Diagnosis not present

## 2023-10-07 DIAGNOSIS — N186 End stage renal disease: Secondary | ICD-10-CM | POA: Diagnosis not present

## 2023-10-07 DIAGNOSIS — N2581 Secondary hyperparathyroidism of renal origin: Secondary | ICD-10-CM | POA: Diagnosis not present

## 2023-10-07 DIAGNOSIS — Z992 Dependence on renal dialysis: Secondary | ICD-10-CM | POA: Diagnosis not present

## 2023-10-08 DIAGNOSIS — I96 Gangrene, not elsewhere classified: Secondary | ICD-10-CM | POA: Diagnosis not present

## 2023-10-08 DIAGNOSIS — E1122 Type 2 diabetes mellitus with diabetic chronic kidney disease: Secondary | ICD-10-CM | POA: Diagnosis not present

## 2023-10-08 DIAGNOSIS — D631 Anemia in chronic kidney disease: Secondary | ICD-10-CM | POA: Diagnosis not present

## 2023-10-08 DIAGNOSIS — I509 Heart failure, unspecified: Secondary | ICD-10-CM | POA: Diagnosis not present

## 2023-10-08 DIAGNOSIS — I132 Hypertensive heart and chronic kidney disease with heart failure and with stage 5 chronic kidney disease, or end stage renal disease: Secondary | ICD-10-CM | POA: Diagnosis not present

## 2023-10-08 DIAGNOSIS — N186 End stage renal disease: Secondary | ICD-10-CM | POA: Diagnosis not present

## 2023-10-09 ENCOUNTER — Ambulatory Visit: Payer: Medicare Other | Admitting: Vascular Surgery

## 2023-10-09 DIAGNOSIS — N2581 Secondary hyperparathyroidism of renal origin: Secondary | ICD-10-CM | POA: Diagnosis not present

## 2023-10-09 DIAGNOSIS — D631 Anemia in chronic kidney disease: Secondary | ICD-10-CM | POA: Diagnosis not present

## 2023-10-09 DIAGNOSIS — E1129 Type 2 diabetes mellitus with other diabetic kidney complication: Secondary | ICD-10-CM | POA: Diagnosis not present

## 2023-10-09 DIAGNOSIS — I96 Gangrene, not elsewhere classified: Secondary | ICD-10-CM | POA: Diagnosis not present

## 2023-10-09 DIAGNOSIS — Z992 Dependence on renal dialysis: Secondary | ICD-10-CM | POA: Diagnosis not present

## 2023-10-09 DIAGNOSIS — N186 End stage renal disease: Secondary | ICD-10-CM | POA: Diagnosis not present

## 2023-10-11 DIAGNOSIS — I132 Hypertensive heart and chronic kidney disease with heart failure and with stage 5 chronic kidney disease, or end stage renal disease: Secondary | ICD-10-CM | POA: Diagnosis not present

## 2023-10-11 DIAGNOSIS — I509 Heart failure, unspecified: Secondary | ICD-10-CM | POA: Diagnosis not present

## 2023-10-11 DIAGNOSIS — E1122 Type 2 diabetes mellitus with diabetic chronic kidney disease: Secondary | ICD-10-CM | POA: Diagnosis not present

## 2023-10-11 DIAGNOSIS — N186 End stage renal disease: Secondary | ICD-10-CM | POA: Diagnosis not present

## 2023-10-11 DIAGNOSIS — I96 Gangrene, not elsewhere classified: Secondary | ICD-10-CM | POA: Diagnosis not present

## 2023-10-11 DIAGNOSIS — E1129 Type 2 diabetes mellitus with other diabetic kidney complication: Secondary | ICD-10-CM | POA: Diagnosis not present

## 2023-10-11 DIAGNOSIS — N2581 Secondary hyperparathyroidism of renal origin: Secondary | ICD-10-CM | POA: Diagnosis not present

## 2023-10-11 DIAGNOSIS — Z992 Dependence on renal dialysis: Secondary | ICD-10-CM | POA: Diagnosis not present

## 2023-10-11 DIAGNOSIS — D631 Anemia in chronic kidney disease: Secondary | ICD-10-CM | POA: Diagnosis not present

## 2023-10-14 DIAGNOSIS — I96 Gangrene, not elsewhere classified: Secondary | ICD-10-CM | POA: Diagnosis not present

## 2023-10-14 DIAGNOSIS — N2581 Secondary hyperparathyroidism of renal origin: Secondary | ICD-10-CM | POA: Diagnosis not present

## 2023-10-14 DIAGNOSIS — N186 End stage renal disease: Secondary | ICD-10-CM | POA: Diagnosis not present

## 2023-10-14 DIAGNOSIS — E1129 Type 2 diabetes mellitus with other diabetic kidney complication: Secondary | ICD-10-CM | POA: Diagnosis not present

## 2023-10-14 DIAGNOSIS — Z992 Dependence on renal dialysis: Secondary | ICD-10-CM | POA: Diagnosis not present

## 2023-10-14 DIAGNOSIS — D631 Anemia in chronic kidney disease: Secondary | ICD-10-CM | POA: Diagnosis not present

## 2023-10-15 DIAGNOSIS — I132 Hypertensive heart and chronic kidney disease with heart failure and with stage 5 chronic kidney disease, or end stage renal disease: Secondary | ICD-10-CM | POA: Diagnosis not present

## 2023-10-15 DIAGNOSIS — E1122 Type 2 diabetes mellitus with diabetic chronic kidney disease: Secondary | ICD-10-CM | POA: Diagnosis not present

## 2023-10-15 DIAGNOSIS — I96 Gangrene, not elsewhere classified: Secondary | ICD-10-CM | POA: Diagnosis not present

## 2023-10-15 DIAGNOSIS — I509 Heart failure, unspecified: Secondary | ICD-10-CM | POA: Diagnosis not present

## 2023-10-15 DIAGNOSIS — D631 Anemia in chronic kidney disease: Secondary | ICD-10-CM | POA: Diagnosis not present

## 2023-10-15 DIAGNOSIS — N186 End stage renal disease: Secondary | ICD-10-CM | POA: Diagnosis not present

## 2023-10-16 ENCOUNTER — Ambulatory Visit: Payer: Medicare Other | Admitting: Podiatry

## 2023-10-16 DIAGNOSIS — E1129 Type 2 diabetes mellitus with other diabetic kidney complication: Secondary | ICD-10-CM | POA: Diagnosis not present

## 2023-10-16 DIAGNOSIS — Z992 Dependence on renal dialysis: Secondary | ICD-10-CM | POA: Diagnosis not present

## 2023-10-16 DIAGNOSIS — N186 End stage renal disease: Secondary | ICD-10-CM | POA: Diagnosis not present

## 2023-10-16 DIAGNOSIS — I96 Gangrene, not elsewhere classified: Secondary | ICD-10-CM | POA: Diagnosis not present

## 2023-10-16 DIAGNOSIS — D631 Anemia in chronic kidney disease: Secondary | ICD-10-CM | POA: Diagnosis not present

## 2023-10-16 DIAGNOSIS — N2581 Secondary hyperparathyroidism of renal origin: Secondary | ICD-10-CM | POA: Diagnosis not present

## 2023-10-17 DIAGNOSIS — I96 Gangrene, not elsewhere classified: Secondary | ICD-10-CM | POA: Diagnosis not present

## 2023-10-17 DIAGNOSIS — E1122 Type 2 diabetes mellitus with diabetic chronic kidney disease: Secondary | ICD-10-CM | POA: Diagnosis not present

## 2023-10-17 DIAGNOSIS — D631 Anemia in chronic kidney disease: Secondary | ICD-10-CM | POA: Diagnosis not present

## 2023-10-17 DIAGNOSIS — I132 Hypertensive heart and chronic kidney disease with heart failure and with stage 5 chronic kidney disease, or end stage renal disease: Secondary | ICD-10-CM | POA: Diagnosis not present

## 2023-10-17 DIAGNOSIS — I509 Heart failure, unspecified: Secondary | ICD-10-CM | POA: Diagnosis not present

## 2023-10-17 DIAGNOSIS — N186 End stage renal disease: Secondary | ICD-10-CM | POA: Diagnosis not present

## 2023-10-18 DIAGNOSIS — I96 Gangrene, not elsewhere classified: Secondary | ICD-10-CM | POA: Diagnosis not present

## 2023-10-18 DIAGNOSIS — I132 Hypertensive heart and chronic kidney disease with heart failure and with stage 5 chronic kidney disease, or end stage renal disease: Secondary | ICD-10-CM | POA: Diagnosis not present

## 2023-10-18 DIAGNOSIS — N186 End stage renal disease: Secondary | ICD-10-CM | POA: Diagnosis not present

## 2023-10-18 DIAGNOSIS — E1122 Type 2 diabetes mellitus with diabetic chronic kidney disease: Secondary | ICD-10-CM | POA: Diagnosis not present

## 2023-10-18 DIAGNOSIS — N2581 Secondary hyperparathyroidism of renal origin: Secondary | ICD-10-CM | POA: Diagnosis not present

## 2023-10-18 DIAGNOSIS — I509 Heart failure, unspecified: Secondary | ICD-10-CM | POA: Diagnosis not present

## 2023-10-18 DIAGNOSIS — D631 Anemia in chronic kidney disease: Secondary | ICD-10-CM | POA: Diagnosis not present

## 2023-10-18 DIAGNOSIS — Z992 Dependence on renal dialysis: Secondary | ICD-10-CM | POA: Diagnosis not present

## 2023-10-18 DIAGNOSIS — E1129 Type 2 diabetes mellitus with other diabetic kidney complication: Secondary | ICD-10-CM | POA: Diagnosis not present

## 2023-10-21 DIAGNOSIS — D631 Anemia in chronic kidney disease: Secondary | ICD-10-CM | POA: Diagnosis not present

## 2023-10-21 DIAGNOSIS — E1129 Type 2 diabetes mellitus with other diabetic kidney complication: Secondary | ICD-10-CM | POA: Diagnosis not present

## 2023-10-21 DIAGNOSIS — I96 Gangrene, not elsewhere classified: Secondary | ICD-10-CM | POA: Diagnosis not present

## 2023-10-21 DIAGNOSIS — Z992 Dependence on renal dialysis: Secondary | ICD-10-CM | POA: Diagnosis not present

## 2023-10-21 DIAGNOSIS — N2581 Secondary hyperparathyroidism of renal origin: Secondary | ICD-10-CM | POA: Diagnosis not present

## 2023-10-21 DIAGNOSIS — N186 End stage renal disease: Secondary | ICD-10-CM | POA: Diagnosis not present

## 2023-10-22 DIAGNOSIS — I509 Heart failure, unspecified: Secondary | ICD-10-CM | POA: Diagnosis not present

## 2023-10-22 DIAGNOSIS — E1122 Type 2 diabetes mellitus with diabetic chronic kidney disease: Secondary | ICD-10-CM | POA: Diagnosis not present

## 2023-10-22 DIAGNOSIS — N186 End stage renal disease: Secondary | ICD-10-CM | POA: Diagnosis not present

## 2023-10-22 DIAGNOSIS — I132 Hypertensive heart and chronic kidney disease with heart failure and with stage 5 chronic kidney disease, or end stage renal disease: Secondary | ICD-10-CM | POA: Diagnosis not present

## 2023-10-22 DIAGNOSIS — I96 Gangrene, not elsewhere classified: Secondary | ICD-10-CM | POA: Diagnosis not present

## 2023-10-22 DIAGNOSIS — D631 Anemia in chronic kidney disease: Secondary | ICD-10-CM | POA: Diagnosis not present

## 2023-10-23 ENCOUNTER — Other Ambulatory Visit: Payer: Self-pay | Admitting: Neurology

## 2023-10-23 DIAGNOSIS — D631 Anemia in chronic kidney disease: Secondary | ICD-10-CM | POA: Diagnosis not present

## 2023-10-23 DIAGNOSIS — N2581 Secondary hyperparathyroidism of renal origin: Secondary | ICD-10-CM | POA: Diagnosis not present

## 2023-10-23 DIAGNOSIS — Z992 Dependence on renal dialysis: Secondary | ICD-10-CM | POA: Diagnosis not present

## 2023-10-23 DIAGNOSIS — I96 Gangrene, not elsewhere classified: Secondary | ICD-10-CM | POA: Diagnosis not present

## 2023-10-23 DIAGNOSIS — N186 End stage renal disease: Secondary | ICD-10-CM | POA: Diagnosis not present

## 2023-10-23 DIAGNOSIS — E1129 Type 2 diabetes mellitus with other diabetic kidney complication: Secondary | ICD-10-CM | POA: Diagnosis not present

## 2023-10-24 DIAGNOSIS — Z79899 Other long term (current) drug therapy: Secondary | ICD-10-CM | POA: Diagnosis not present

## 2023-10-24 DIAGNOSIS — Z48298 Encounter for aftercare following other organ transplant: Secondary | ICD-10-CM | POA: Diagnosis not present

## 2023-10-24 DIAGNOSIS — Z941 Heart transplant status: Secondary | ICD-10-CM | POA: Diagnosis not present

## 2023-10-24 DIAGNOSIS — Z992 Dependence on renal dialysis: Secondary | ICD-10-CM | POA: Diagnosis not present

## 2023-10-24 DIAGNOSIS — D849 Immunodeficiency, unspecified: Secondary | ICD-10-CM | POA: Diagnosis not present

## 2023-10-24 DIAGNOSIS — I120 Hypertensive chronic kidney disease with stage 5 chronic kidney disease or end stage renal disease: Secondary | ICD-10-CM | POA: Diagnosis not present

## 2023-10-24 DIAGNOSIS — Z2989 Encounter for other specified prophylactic measures: Secondary | ICD-10-CM | POA: Diagnosis not present

## 2023-10-24 DIAGNOSIS — Z4821 Encounter for aftercare following heart transplant: Secondary | ICD-10-CM | POA: Diagnosis not present

## 2023-10-24 DIAGNOSIS — D84821 Immunodeficiency due to drugs: Secondary | ICD-10-CM | POA: Diagnosis not present

## 2023-10-24 DIAGNOSIS — Z515 Encounter for palliative care: Secondary | ICD-10-CM | POA: Diagnosis not present

## 2023-10-24 DIAGNOSIS — Z125 Encounter for screening for malignant neoplasm of prostate: Secondary | ICD-10-CM | POA: Diagnosis not present

## 2023-10-24 DIAGNOSIS — N186 End stage renal disease: Secondary | ICD-10-CM | POA: Diagnosis not present

## 2023-10-25 DIAGNOSIS — I6350 Cerebral infarction due to unspecified occlusion or stenosis of unspecified cerebral artery: Secondary | ICD-10-CM | POA: Diagnosis not present

## 2023-10-25 DIAGNOSIS — E785 Hyperlipidemia, unspecified: Secondary | ICD-10-CM | POA: Diagnosis not present

## 2023-10-25 DIAGNOSIS — N2581 Secondary hyperparathyroidism of renal origin: Secondary | ICD-10-CM | POA: Diagnosis not present

## 2023-10-25 DIAGNOSIS — I96 Gangrene, not elsewhere classified: Secondary | ICD-10-CM | POA: Diagnosis not present

## 2023-10-25 DIAGNOSIS — I132 Hypertensive heart and chronic kidney disease with heart failure and with stage 5 chronic kidney disease, or end stage renal disease: Secondary | ICD-10-CM | POA: Diagnosis not present

## 2023-10-25 DIAGNOSIS — E1122 Type 2 diabetes mellitus with diabetic chronic kidney disease: Secondary | ICD-10-CM | POA: Diagnosis not present

## 2023-10-25 DIAGNOSIS — Z992 Dependence on renal dialysis: Secondary | ICD-10-CM | POA: Diagnosis not present

## 2023-10-25 DIAGNOSIS — I509 Heart failure, unspecified: Secondary | ICD-10-CM | POA: Diagnosis not present

## 2023-10-25 DIAGNOSIS — E1129 Type 2 diabetes mellitus with other diabetic kidney complication: Secondary | ICD-10-CM | POA: Diagnosis not present

## 2023-10-25 DIAGNOSIS — D631 Anemia in chronic kidney disease: Secondary | ICD-10-CM | POA: Diagnosis not present

## 2023-10-25 DIAGNOSIS — N186 End stage renal disease: Secondary | ICD-10-CM | POA: Diagnosis not present

## 2023-10-25 DIAGNOSIS — I953 Hypotension of hemodialysis: Secondary | ICD-10-CM | POA: Diagnosis not present

## 2023-10-26 DIAGNOSIS — F02C0 Dementia in other diseases classified elsewhere, severe, without behavioral disturbance, psychotic disturbance, mood disturbance, and anxiety: Secondary | ICD-10-CM | POA: Diagnosis not present

## 2023-10-26 DIAGNOSIS — T8620 Unspecified complication of heart transplant: Secondary | ICD-10-CM | POA: Diagnosis not present

## 2023-10-26 DIAGNOSIS — N186 End stage renal disease: Secondary | ICD-10-CM | POA: Diagnosis not present

## 2023-10-26 DIAGNOSIS — Z992 Dependence on renal dialysis: Secondary | ICD-10-CM | POA: Diagnosis not present

## 2023-10-28 DIAGNOSIS — E1129 Type 2 diabetes mellitus with other diabetic kidney complication: Secondary | ICD-10-CM | POA: Diagnosis not present

## 2023-10-28 DIAGNOSIS — N186 End stage renal disease: Secondary | ICD-10-CM | POA: Diagnosis not present

## 2023-10-28 DIAGNOSIS — D509 Iron deficiency anemia, unspecified: Secondary | ICD-10-CM | POA: Diagnosis not present

## 2023-10-28 DIAGNOSIS — N2581 Secondary hyperparathyroidism of renal origin: Secondary | ICD-10-CM | POA: Diagnosis not present

## 2023-10-28 DIAGNOSIS — Z992 Dependence on renal dialysis: Secondary | ICD-10-CM | POA: Diagnosis not present

## 2023-10-28 DIAGNOSIS — D631 Anemia in chronic kidney disease: Secondary | ICD-10-CM | POA: Diagnosis not present

## 2023-10-29 DIAGNOSIS — D631 Anemia in chronic kidney disease: Secondary | ICD-10-CM | POA: Diagnosis not present

## 2023-10-29 DIAGNOSIS — I96 Gangrene, not elsewhere classified: Secondary | ICD-10-CM | POA: Diagnosis not present

## 2023-10-29 DIAGNOSIS — E1122 Type 2 diabetes mellitus with diabetic chronic kidney disease: Secondary | ICD-10-CM | POA: Diagnosis not present

## 2023-10-29 DIAGNOSIS — I132 Hypertensive heart and chronic kidney disease with heart failure and with stage 5 chronic kidney disease, or end stage renal disease: Secondary | ICD-10-CM | POA: Diagnosis not present

## 2023-10-29 DIAGNOSIS — N186 End stage renal disease: Secondary | ICD-10-CM | POA: Diagnosis not present

## 2023-10-29 DIAGNOSIS — I509 Heart failure, unspecified: Secondary | ICD-10-CM | POA: Diagnosis not present

## 2023-10-30 DIAGNOSIS — D631 Anemia in chronic kidney disease: Secondary | ICD-10-CM | POA: Diagnosis not present

## 2023-10-30 DIAGNOSIS — Z992 Dependence on renal dialysis: Secondary | ICD-10-CM | POA: Diagnosis not present

## 2023-10-30 DIAGNOSIS — E1129 Type 2 diabetes mellitus with other diabetic kidney complication: Secondary | ICD-10-CM | POA: Diagnosis not present

## 2023-10-30 DIAGNOSIS — N186 End stage renal disease: Secondary | ICD-10-CM | POA: Diagnosis not present

## 2023-10-30 DIAGNOSIS — N2581 Secondary hyperparathyroidism of renal origin: Secondary | ICD-10-CM | POA: Diagnosis not present

## 2023-10-30 DIAGNOSIS — D509 Iron deficiency anemia, unspecified: Secondary | ICD-10-CM | POA: Diagnosis not present

## 2023-10-31 DIAGNOSIS — I447 Left bundle-branch block, unspecified: Secondary | ICD-10-CM | POA: Diagnosis not present

## 2023-10-31 DIAGNOSIS — G894 Chronic pain syndrome: Secondary | ICD-10-CM | POA: Diagnosis not present

## 2023-10-31 DIAGNOSIS — N186 End stage renal disease: Secondary | ICD-10-CM | POA: Diagnosis not present

## 2023-10-31 DIAGNOSIS — E1122 Type 2 diabetes mellitus with diabetic chronic kidney disease: Secondary | ICD-10-CM | POA: Diagnosis not present

## 2023-10-31 DIAGNOSIS — E1142 Type 2 diabetes mellitus with diabetic polyneuropathy: Secondary | ICD-10-CM | POA: Diagnosis not present

## 2023-10-31 DIAGNOSIS — I252 Old myocardial infarction: Secondary | ICD-10-CM | POA: Diagnosis not present

## 2023-10-31 DIAGNOSIS — I2510 Atherosclerotic heart disease of native coronary artery without angina pectoris: Secondary | ICD-10-CM | POA: Diagnosis not present

## 2023-10-31 DIAGNOSIS — I132 Hypertensive heart and chronic kidney disease with heart failure and with stage 5 chronic kidney disease, or end stage renal disease: Secondary | ICD-10-CM | POA: Diagnosis not present

## 2023-10-31 DIAGNOSIS — I420 Dilated cardiomyopathy: Secondary | ICD-10-CM | POA: Diagnosis not present

## 2023-10-31 DIAGNOSIS — F0283 Dementia in other diseases classified elsewhere, unspecified severity, with mood disturbance: Secondary | ICD-10-CM | POA: Diagnosis not present

## 2023-10-31 DIAGNOSIS — E7800 Pure hypercholesterolemia, unspecified: Secondary | ICD-10-CM | POA: Diagnosis not present

## 2023-10-31 DIAGNOSIS — G4733 Obstructive sleep apnea (adult) (pediatric): Secondary | ICD-10-CM | POA: Diagnosis not present

## 2023-10-31 DIAGNOSIS — G20C Parkinsonism, unspecified: Secondary | ICD-10-CM | POA: Diagnosis not present

## 2023-10-31 DIAGNOSIS — F32A Depression, unspecified: Secondary | ICD-10-CM | POA: Diagnosis not present

## 2023-10-31 DIAGNOSIS — F02818 Dementia in other diseases classified elsewhere, unspecified severity, with other behavioral disturbance: Secondary | ICD-10-CM | POA: Diagnosis not present

## 2023-10-31 DIAGNOSIS — I951 Orthostatic hypotension: Secondary | ICD-10-CM | POA: Diagnosis not present

## 2023-10-31 DIAGNOSIS — I493 Ventricular premature depolarization: Secondary | ICD-10-CM | POA: Diagnosis not present

## 2023-10-31 DIAGNOSIS — M1990 Unspecified osteoarthritis, unspecified site: Secondary | ICD-10-CM | POA: Diagnosis not present

## 2023-10-31 DIAGNOSIS — Z6833 Body mass index (BMI) 33.0-33.9, adult: Secondary | ICD-10-CM | POA: Diagnosis not present

## 2023-10-31 DIAGNOSIS — I96 Gangrene, not elsewhere classified: Secondary | ICD-10-CM | POA: Diagnosis not present

## 2023-10-31 DIAGNOSIS — I509 Heart failure, unspecified: Secondary | ICD-10-CM | POA: Diagnosis not present

## 2023-10-31 DIAGNOSIS — D631 Anemia in chronic kidney disease: Secondary | ICD-10-CM | POA: Diagnosis not present

## 2023-10-31 DIAGNOSIS — I4891 Unspecified atrial fibrillation: Secondary | ICD-10-CM | POA: Diagnosis not present

## 2023-11-01 DIAGNOSIS — E1129 Type 2 diabetes mellitus with other diabetic kidney complication: Secondary | ICD-10-CM | POA: Diagnosis not present

## 2023-11-01 DIAGNOSIS — D509 Iron deficiency anemia, unspecified: Secondary | ICD-10-CM | POA: Diagnosis not present

## 2023-11-01 DIAGNOSIS — Z992 Dependence on renal dialysis: Secondary | ICD-10-CM | POA: Diagnosis not present

## 2023-11-01 DIAGNOSIS — D631 Anemia in chronic kidney disease: Secondary | ICD-10-CM | POA: Diagnosis not present

## 2023-11-01 DIAGNOSIS — N186 End stage renal disease: Secondary | ICD-10-CM | POA: Diagnosis not present

## 2023-11-01 DIAGNOSIS — N2581 Secondary hyperparathyroidism of renal origin: Secondary | ICD-10-CM | POA: Diagnosis not present

## 2023-11-04 DIAGNOSIS — E1129 Type 2 diabetes mellitus with other diabetic kidney complication: Secondary | ICD-10-CM | POA: Diagnosis not present

## 2023-11-04 DIAGNOSIS — N186 End stage renal disease: Secondary | ICD-10-CM | POA: Diagnosis not present

## 2023-11-04 DIAGNOSIS — D509 Iron deficiency anemia, unspecified: Secondary | ICD-10-CM | POA: Diagnosis not present

## 2023-11-04 DIAGNOSIS — N2581 Secondary hyperparathyroidism of renal origin: Secondary | ICD-10-CM | POA: Diagnosis not present

## 2023-11-04 DIAGNOSIS — Z992 Dependence on renal dialysis: Secondary | ICD-10-CM | POA: Diagnosis not present

## 2023-11-04 DIAGNOSIS — D631 Anemia in chronic kidney disease: Secondary | ICD-10-CM | POA: Diagnosis not present

## 2023-11-05 DIAGNOSIS — N186 End stage renal disease: Secondary | ICD-10-CM | POA: Diagnosis not present

## 2023-11-05 DIAGNOSIS — I96 Gangrene, not elsewhere classified: Secondary | ICD-10-CM | POA: Diagnosis not present

## 2023-11-05 DIAGNOSIS — I132 Hypertensive heart and chronic kidney disease with heart failure and with stage 5 chronic kidney disease, or end stage renal disease: Secondary | ICD-10-CM | POA: Diagnosis not present

## 2023-11-05 DIAGNOSIS — E1122 Type 2 diabetes mellitus with diabetic chronic kidney disease: Secondary | ICD-10-CM | POA: Diagnosis not present

## 2023-11-05 DIAGNOSIS — D631 Anemia in chronic kidney disease: Secondary | ICD-10-CM | POA: Diagnosis not present

## 2023-11-05 DIAGNOSIS — I509 Heart failure, unspecified: Secondary | ICD-10-CM | POA: Diagnosis not present

## 2023-11-06 ENCOUNTER — Encounter: Payer: Self-pay | Admitting: Vascular Surgery

## 2023-11-06 ENCOUNTER — Ambulatory Visit (INDEPENDENT_AMBULATORY_CARE_PROVIDER_SITE_OTHER): Payer: Medicare Other | Admitting: Vascular Surgery

## 2023-11-06 VITALS — BP 103/72 | HR 93 | Ht 79.0 in | Wt 266.0 lb

## 2023-11-06 DIAGNOSIS — N2581 Secondary hyperparathyroidism of renal origin: Secondary | ICD-10-CM | POA: Diagnosis not present

## 2023-11-06 DIAGNOSIS — E1129 Type 2 diabetes mellitus with other diabetic kidney complication: Secondary | ICD-10-CM | POA: Diagnosis not present

## 2023-11-06 DIAGNOSIS — D509 Iron deficiency anemia, unspecified: Secondary | ICD-10-CM | POA: Diagnosis not present

## 2023-11-06 DIAGNOSIS — Z992 Dependence on renal dialysis: Secondary | ICD-10-CM | POA: Diagnosis not present

## 2023-11-06 DIAGNOSIS — D631 Anemia in chronic kidney disease: Secondary | ICD-10-CM | POA: Diagnosis not present

## 2023-11-06 DIAGNOSIS — N186 End stage renal disease: Secondary | ICD-10-CM | POA: Diagnosis not present

## 2023-11-06 NOTE — Progress Notes (Signed)
 Patient ID: [name], male   DOB: 04-May-1961, 63 y.o.   MRN: [id number]  Reason for Consult: Follow-up   Referred by Bufford Buttner, MD  Subjective:     HPI:  [name] is a 63 y.o. male history of residual on dialysis.  Last 14 years.  He most recently had a fistula converted to a graft in his right forearm which subsequently thrombosed and is currently using a catheter.  He did have a previous fistulogram as well.  Currently interested in home dialysis.  Previously was on Eliquis but states this was recently stopped.  Past Medical History:  Diagnosis Date   Anxiety    Arthritis    Atrial fibrillation (HCC)    CHF (congestive heart failure), NYHA class III (HCC)    s/p heart transplant   Chronic kidney disease    end stage   Chronic systolic dysfunction of left ventricle    CVA (cerebral infarction)    Dementia (HCC)    Depression    Diabetes mellitus    PMH; Prior to heart transplant   Family history of coronary artery disease    in both parents   GERD (gastroesophageal reflux disease)    Hyperlipidemia    Hypertension    Left bundle branch block    Morbid obesity (HCC)    status post lap band   Myocardial infarction [facility])    prior to heart transplant   Nonischemic cardiomyopathy (HCC)    prior to heart transplant   Obesity (BMI 30-39.9)    Obstructive sleep apnea    no longer needs CPAP after heart transplant per pt   Premature ventricular contractions    Prostate cancer (HCC)    SOB (shortness of breath)    Family History  Problem Relation Age of Onset   Coronary artery disease Father        had, PTCA & CABG   Heart attack Father    Hypertension Father    Diabetes Father    Prostate cancer Father    Coronary artery disease Mother    Heart attack Mother    Hypertension Mother    Stroke Mother    Lupus Mother    Prostate cancer Brother    Coronary artery disease Brother    Coronary artery disease Sister    Heart attack Sister    Prostate  cancer Paternal Uncle    Coronary artery disease Maternal Grandmother    Coronary artery disease Maternal Grandfather    Coronary artery disease Paternal Grandmother    Coronary artery disease Paternal Grandfather    Past Surgical History:  Procedure Laterality Date   A/V FISTULAGRAM Right 05/23/2023   Procedure: A/V Fistulagram;  Surgeon: Victorino Sparrow, MD;  Location: [facility] INVASIVE CV LAB;  Service: Cardiovascular;  Laterality: Right;   AV FISTULA PLACEMENT Right 07/04/2023   Procedure: ARTERIOVENOUS (AV) FISTULA  REVISION WITH APPLICATION OF ARTEGRAFT RIGHT ARM;  Surgeon: Maeola Harman, MD;  Location: [facility] OR;  Service: Vascular;  Laterality: Right;   CARDIAC PACEMAKER PLACEMENT  09/21/2009   Biventricular implantable cardioverter-defibrillator implantation      COLONOSCOPY W/ BIOPSIES AND POLYPECTOMY     CYSTOSCOPY WITH FULGERATION N/A 04/27/2021   Procedure: CYSTOSCOPY AND CLOT EVACUATION WITH BLADDER BIOPSY/ FUGARATION OF BLADDER/ FULGARATION OF PROSTATE ;  Surgeon: Jerilee Field, MD;  Location: [facility] ORS;  Service: Urology;  Laterality: N/A;   FOOT SURGERY     left   HEART TRANSPLANT  2014  INSERTION OF DIALYSIS CATHETER Right 07/04/2023   Procedure: INSERTION OF DIALYSIS CATHETER WITH 19 CM PALINDROME;  Surgeon: Maeola Harman, MD;  Location: [facility] OR;  Service: Vascular;  Laterality: Right;   LAPAROSCOPIC GASTRIC BANDING  01/27/2007   LEFT VENTRICULAR ASSIST DEVICE     implanted at [facility]   PACEMAKER REMOVAL     PROSTATE BIOPSY     TOTAL HIP ARTHROPLASTY Right 07/22/2017   Procedure: RIGHT TOTAL HIP ARTHROPLASTY ANTERIOR APPROACH;  Surgeon: Samson Frederic, MD;  Location: [facility] OR;  Service: Orthopedics;  Laterality: Right;  Needs RNFA   TOTAL KNEE ARTHROPLASTY Right 07/22/2017    Short Social History:  Social History   Tobacco Use   Smoking status: Never   Smokeless tobacco: Former    Types: Snuff  Substance Use Topics   Alcohol use: No    Allergies   Allergen Reactions   Heparin Nausea Only, Swelling and Other (See Comments)    * * HIT * *  SWELLING REACTION UNSPECIFIED   DIAPHORESIS    Iodinated Contrast Media     Unknown reaction   Penicillin G Potassium [Penicillin G] Nausea Only and Other (See Comments)    DIAPHORESIS High Doses    Current Outpatient Medications  Medication Sig Dispense Refill   acetaminophen (TYLENOL) 500 MG tablet Take 1,000 mg by mouth 2 (two) times daily as needed for moderate pain (pain score 4-6).     ARIPiprazole (ABILIFY) 10 MG tablet Take 10 mg by mouth every morning.     atorvastatin (LIPITOR) 40 MG tablet Take 1 tablet (40 mg total) by mouth daily. 30 tablet 0   cinacalcet (SENSIPAR) 30 MG tablet Take 4 tablets (120 mg total) by mouth daily with supper.     cycloSPORINE modified (NEORAL) 100 MG capsule Take 100 mg by mouth See admin instructions. Take with 25 mg for a total of 125 mg twice daily     cycloSPORINE modified 25 MG CAPS Take 25 mg by mouth See admin instructions. Take with 100 mg for a total of 125 mg twice daily     divalproex (DEPAKOTE) 125 MG DR tablet Take 250 mg by mouth in morning and 500 mg by mouth at night (Patient taking differently: Take 250-500 mg by mouth See admin instructions. Take 250 mg by mouth in morning and 500 mg by mouth at night) 180 tablet 11   DULoxetine (CYMBALTA) 60 MG capsule TAKE 1 CAPSULE(60 MG) BY MOUTH DAILY 90 capsule 3   ELIQUIS 2.5 MG TABS tablet Take 1 tablet (2.5 mg total) by mouth 2 (two) times daily. 60 tablet    gentamicin cream (GARAMYCIN) 0.1 % Apply 1 Application topically 2 (two) times daily. 30 g 1   hydrocortisone (ANUSOL-HC) 2.5 % rectal cream Apply 1 Application topically as needed for hemorrhoids or anal itching.     ibuprofen (ADVIL) 200 MG tablet Take 400 mg by mouth as needed for moderate pain (pain score 4-6) or mild pain (pain score 1-3).     midodrine (PROAMATINE) 10 MG tablet Take 1 tablet (10 mg total) by mouth 3 (three) times daily  with meals. 90 tablet 0   mycophenolate (MYFORTIC) 180 MG EC tablet Take 720 mg by mouth 2 (two) times daily.     naloxone (NARCAN) nasal spray 4 mg/0.1 mL Place 0.4 mg into the nose once as needed (opioid overdose).     oxyCODONE-acetaminophen (PERCOCET) 5-325 MG tablet Take 1 tablet by mouth every 6 (six) hours as needed for severe  pain (pain score 7-10). Do not take with other opiate pain medication 20 tablet 0   pregabalin (LYRICA) 75 MG capsule Take 1 capsule (75 mg total) by mouth daily. 30 capsule 0   sevelamer carbonate (RENVELA) 800 MG tablet Take 2 tablets (1,600 mg total) by mouth 3 (three) times daily with meals. (Patient taking differently: Take 2,400 mg by mouth 3 (three) times daily with meals. 800 mg with snacks) 120 tablet 0   traZODone (DESYREL) 100 MG tablet Take 100 mg by mouth at bedtime.     No current facility-administered medications for this visit.    Review of Systems  Constitutional:  Constitutional negative. HENT: HENT negative.  Eyes: Eyes negative.  Respiratory: Respiratory negative.  Cardiovascular: Cardiovascular negative.  GI: Gastrointestinal negative.  Musculoskeletal: Musculoskeletal negative.  Skin: Skin negative.  Neurological: Positive for focal weakness.  Hematologic: Hematologic/lymphatic negative.  Psychiatric: Psychiatric negative.        Objective:  Objective   Vitals:   11/06/23 1554  BP: 103/72  Pulse: 93  SpO2: 95%  Weight: 266 lb (120.7 kg)  Height: 6\' 7"  (2.007 m)   Body mass index is 29.97 kg/m.  Physical Exam HENT:     Head: Normocephalic.     Mouth/Throat:     Mouth: Mucous membranes are moist.  Eyes:     Pupils: Pupils are equal, round, and reactive to light.  Neck:     Comments: Right internal jugular tdc in place  Cardiovascular:     Rate and Rhythm: Normal rate.     Pulses:          Radial pulses are 2+ on the right side.  Pulmonary:     Effort: Pulmonary effort is normal.  Abdominal:     General: Abdomen  is flat.  Musculoskeletal:     Right lower leg: No edema.     Left lower leg: No edema.  Neurological:     Mental Status: He is alert.     Data: I reviewed his previous fistulogram which demonstrates a large cephalic vein in the upper arm from the antecubital and more cephalad and this was evaluated at bedside ultrasound today and also appears larger than normal for fistula creation.     Assessment/Plan:     63 year old male with dialysis via catheter.  He is here to discuss further access options.  Appears to have suitable cephalic vein by bedside ultrasound and previous fistulogram.  At this time patient does not want to schedule any surgery and will call to schedule when he is ready.  All questions were answered in the presence of his daughter.     Maeola Harman MD [facility]

## 2023-11-06 NOTE — H&P (View-Only) (Signed)
 Patient ID: [name], male   DOB: 04-May-1961, 63 y.o.   MRN: [id number]  Reason for Consult: Follow-up   Referred by [name] Buttner, MD  Subjective:     HPI:  [name] is a 63 y.o. male history of residual on dialysis.  Last 14 years.  He most recently had a fistula converted to a graft in his right forearm which subsequently thrombosed and is currently using a catheter.  He did have a previous fistulogram as well.  Currently interested in home dialysis.  Previously was on Eliquis but states this was recently stopped.  Past Medical History:  Diagnosis Date   Anxiety    Arthritis    Atrial fibrillation (HCC)    CHF (congestive heart failure), NYHA class III (HCC)    s/p heart transplant   Chronic kidney disease    end stage   Chronic systolic dysfunction of left ventricle    CVA (cerebral infarction)    Dementia (HCC)    Depression    Diabetes mellitus    PMH; Prior to heart transplant   Family history of coronary artery disease    in both parents   GERD (gastroesophageal reflux disease)    Hyperlipidemia    Hypertension    Left bundle branch block    Morbid obesity (HCC)    status post lap band   Myocardial infarction [facility])    prior to heart transplant   Nonischemic cardiomyopathy (HCC)    prior to heart transplant   Obesity (BMI 30-39.9)    Obstructive sleep apnea    no longer needs CPAP after heart transplant per pt   Premature ventricular contractions    Prostate cancer (HCC)    SOB (shortness of breath)    Family History  Problem Relation Age of Onset   Coronary artery disease Father        had, PTCA & CABG   Heart attack Father    Hypertension Father    Diabetes Father    Prostate cancer Father    Coronary artery disease Mother    Heart attack Mother    Hypertension Mother    Stroke Mother    Lupus Mother    Prostate cancer Brother    Coronary artery disease Brother    Coronary artery disease Sister    Heart attack Sister    Prostate  cancer Paternal Uncle    Coronary artery disease Maternal Grandmother    Coronary artery disease Maternal Grandfather    Coronary artery disease Paternal Grandmother    Coronary artery disease Paternal Grandfather    Past Surgical History:  Procedure Laterality Date   A/V FISTULAGRAM Right 05/23/2023   Procedure: A/V Fistulagram;  Surgeon: Victorino Sparrow, MD;  Location: [facility] INVASIVE CV LAB;  Service: Cardiovascular;  Laterality: Right;   AV FISTULA PLACEMENT Right 07/04/2023   Procedure: ARTERIOVENOUS (AV) FISTULA  REVISION WITH APPLICATION OF ARTEGRAFT RIGHT ARM;  Surgeon: Maeola Harman, MD;  Location: [facility] OR;  Service: Vascular;  Laterality: Right;   CARDIAC PACEMAKER PLACEMENT  09/21/2009   Biventricular implantable cardioverter-defibrillator implantation      COLONOSCOPY W/ BIOPSIES AND POLYPECTOMY     CYSTOSCOPY WITH FULGERATION N/A 04/27/2021   Procedure: CYSTOSCOPY AND CLOT EVACUATION WITH BLADDER BIOPSY/ FUGARATION OF BLADDER/ FULGARATION OF PROSTATE ;  Surgeon: Jerilee Field, MD;  Location: [facility] ORS;  Service: Urology;  Laterality: N/A;   FOOT SURGERY     left   HEART TRANSPLANT  2014  INSERTION OF DIALYSIS CATHETER Right 07/04/2023   Procedure: INSERTION OF DIALYSIS CATHETER WITH 19 CM PALINDROME;  Surgeon: Maeola Harman, MD;  Location: [facility] OR;  Service: Vascular;  Laterality: Right;   LAPAROSCOPIC GASTRIC BANDING  01/27/2007   LEFT VENTRICULAR ASSIST DEVICE     implanted at [facility]   PACEMAKER REMOVAL     PROSTATE BIOPSY     TOTAL HIP ARTHROPLASTY Right 07/22/2017   Procedure: RIGHT TOTAL HIP ARTHROPLASTY ANTERIOR APPROACH;  Surgeon: Samson Frederic, MD;  Location: [facility] OR;  Service: Orthopedics;  Laterality: Right;  Needs RNFA   TOTAL KNEE ARTHROPLASTY Right 07/22/2017    Short Social History:  Social History   Tobacco Use   Smoking status: Never   Smokeless tobacco: Former    Types: Snuff  Substance Use Topics   Alcohol use: No    Allergies   Allergen Reactions   Heparin Nausea Only, Swelling and Other (See Comments)    * * HIT * *  SWELLING REACTION UNSPECIFIED   DIAPHORESIS    Iodinated Contrast Media     Unknown reaction   Penicillin G Potassium [Penicillin G] Nausea Only and Other (See Comments)    DIAPHORESIS High Doses    Current Outpatient Medications  Medication Sig Dispense Refill   acetaminophen (TYLENOL) 500 MG tablet Take 1,000 mg by mouth 2 (two) times daily as needed for moderate pain (pain score 4-6).     ARIPiprazole (ABILIFY) 10 MG tablet Take 10 mg by mouth every morning.     atorvastatin (LIPITOR) 40 MG tablet Take 1 tablet (40 mg total) by mouth daily. 30 tablet 0   cinacalcet (SENSIPAR) 30 MG tablet Take 4 tablets (120 mg total) by mouth daily with supper.     cycloSPORINE modified (NEORAL) 100 MG capsule Take 100 mg by mouth See admin instructions. Take with 25 mg for a total of 125 mg twice daily     cycloSPORINE modified 25 MG CAPS Take 25 mg by mouth See admin instructions. Take with 100 mg for a total of 125 mg twice daily     divalproex (DEPAKOTE) 125 MG DR tablet Take 250 mg by mouth in morning and 500 mg by mouth at night (Patient taking differently: Take 250-500 mg by mouth See admin instructions. Take 250 mg by mouth in morning and 500 mg by mouth at night) 180 tablet 11   DULoxetine (CYMBALTA) 60 MG capsule TAKE 1 CAPSULE(60 MG) BY MOUTH DAILY 90 capsule 3   ELIQUIS 2.5 MG TABS tablet Take 1 tablet (2.5 mg total) by mouth 2 (two) times daily. 60 tablet    gentamicin cream (GARAMYCIN) 0.1 % Apply 1 Application topically 2 (two) times daily. 30 g 1   hydrocortisone (ANUSOL-HC) 2.5 % rectal cream Apply 1 Application topically as needed for hemorrhoids or anal itching.     ibuprofen (ADVIL) 200 MG tablet Take 400 mg by mouth as needed for moderate pain (pain score 4-6) or mild pain (pain score 1-3).     midodrine (PROAMATINE) 10 MG tablet Take 1 tablet (10 mg total) by mouth 3 (three) times daily  with meals. 90 tablet 0   mycophenolate (MYFORTIC) 180 MG EC tablet Take 720 mg by mouth 2 (two) times daily.     naloxone (NARCAN) nasal spray 4 mg/0.1 mL Place 0.4 mg into the nose once as needed (opioid overdose).     oxyCODONE-acetaminophen (PERCOCET) 5-325 MG tablet Take 1 tablet by mouth every 6 (six) hours as needed for severe  pain (pain score 7-10). Do not take with other opiate pain medication 20 tablet 0   pregabalin (LYRICA) 75 MG capsule Take 1 capsule (75 mg total) by mouth daily. 30 capsule 0   sevelamer carbonate (RENVELA) 800 MG tablet Take 2 tablets (1,600 mg total) by mouth 3 (three) times daily with meals. (Patient taking differently: Take 2,400 mg by mouth 3 (three) times daily with meals. 800 mg with snacks) 120 tablet 0   traZODone (DESYREL) 100 MG tablet Take 100 mg by mouth at bedtime.     No current facility-administered medications for this visit.    Review of Systems  Constitutional:  Constitutional negative. HENT: HENT negative.  Eyes: Eyes negative.  Respiratory: Respiratory negative.  Cardiovascular: Cardiovascular negative.  GI: Gastrointestinal negative.  Musculoskeletal: Musculoskeletal negative.  Skin: Skin negative.  Neurological: Positive for focal weakness.  Hematologic: Hematologic/lymphatic negative.  Psychiatric: Psychiatric negative.        Objective:  Objective   Vitals:   11/06/23 1554  BP: 103/72  Pulse: 93  SpO2: 95%  Weight: 266 lb (120.7 kg)  Height: 6\' 7"  (2.007 m)   Body mass index is 29.97 kg/m.  Physical Exam HENT:     Head: Normocephalic.     Mouth/Throat:     Mouth: Mucous membranes are moist.  Eyes:     Pupils: Pupils are equal, round, and reactive to light.  Neck:     Comments: Right internal jugular tdc in place  Cardiovascular:     Rate and Rhythm: Normal rate.     Pulses:          Radial pulses are 2+ on the right side.  Pulmonary:     Effort: Pulmonary effort is normal.  Abdominal:     General: Abdomen  is flat.  Musculoskeletal:     Right lower leg: No edema.     Left lower leg: No edema.  Neurological:     Mental Status: He is alert.     Data: I reviewed his previous fistulogram which demonstrates a large cephalic vein in the upper arm from the antecubital and more cephalad and this was evaluated at bedside ultrasound today and also appears larger than normal for fistula creation.     Assessment/Plan:     63 year old male with dialysis via catheter.  He is here to discuss further access options.  Appears to have suitable cephalic vein by bedside ultrasound and previous fistulogram.  At this time patient does not want to schedule any surgery and will call to schedule when he is ready.  All questions were answered in the presence of his daughter.     Maeola Harman MD [facility]

## 2023-11-07 DIAGNOSIS — N186 End stage renal disease: Secondary | ICD-10-CM | POA: Diagnosis not present

## 2023-11-07 DIAGNOSIS — I96 Gangrene, not elsewhere classified: Secondary | ICD-10-CM | POA: Diagnosis not present

## 2023-11-07 DIAGNOSIS — I132 Hypertensive heart and chronic kidney disease with heart failure and with stage 5 chronic kidney disease, or end stage renal disease: Secondary | ICD-10-CM | POA: Diagnosis not present

## 2023-11-07 DIAGNOSIS — I509 Heart failure, unspecified: Secondary | ICD-10-CM | POA: Diagnosis not present

## 2023-11-07 DIAGNOSIS — E1122 Type 2 diabetes mellitus with diabetic chronic kidney disease: Secondary | ICD-10-CM | POA: Diagnosis not present

## 2023-11-07 DIAGNOSIS — D631 Anemia in chronic kidney disease: Secondary | ICD-10-CM | POA: Diagnosis not present

## 2023-11-08 DIAGNOSIS — D509 Iron deficiency anemia, unspecified: Secondary | ICD-10-CM | POA: Diagnosis not present

## 2023-11-08 DIAGNOSIS — Z992 Dependence on renal dialysis: Secondary | ICD-10-CM | POA: Diagnosis not present

## 2023-11-08 DIAGNOSIS — N186 End stage renal disease: Secondary | ICD-10-CM | POA: Diagnosis not present

## 2023-11-08 DIAGNOSIS — D631 Anemia in chronic kidney disease: Secondary | ICD-10-CM | POA: Diagnosis not present

## 2023-11-08 DIAGNOSIS — N2581 Secondary hyperparathyroidism of renal origin: Secondary | ICD-10-CM | POA: Diagnosis not present

## 2023-11-08 DIAGNOSIS — E1129 Type 2 diabetes mellitus with other diabetic kidney complication: Secondary | ICD-10-CM | POA: Diagnosis not present

## 2023-11-11 ENCOUNTER — Telehealth: Payer: Self-pay

## 2023-11-11 DIAGNOSIS — Z992 Dependence on renal dialysis: Secondary | ICD-10-CM | POA: Diagnosis not present

## 2023-11-11 DIAGNOSIS — N2581 Secondary hyperparathyroidism of renal origin: Secondary | ICD-10-CM | POA: Diagnosis not present

## 2023-11-11 DIAGNOSIS — D509 Iron deficiency anemia, unspecified: Secondary | ICD-10-CM | POA: Diagnosis not present

## 2023-11-11 DIAGNOSIS — D631 Anemia in chronic kidney disease: Secondary | ICD-10-CM | POA: Diagnosis not present

## 2023-11-11 DIAGNOSIS — N186 End stage renal disease: Secondary | ICD-10-CM | POA: Diagnosis not present

## 2023-11-11 DIAGNOSIS — E1129 Type 2 diabetes mellitus with other diabetic kidney complication: Secondary | ICD-10-CM | POA: Diagnosis not present

## 2023-11-11 NOTE — Telephone Encounter (Signed)
 Attempted call for surgery scheduling.  Patient's spouse stated that they are still considering AVF placement but are also interested in PD cath placement.  Spouse requests a callback tomorrow, 3/18.

## 2023-11-12 ENCOUNTER — Other Ambulatory Visit: Payer: Self-pay

## 2023-11-12 DIAGNOSIS — I132 Hypertensive heart and chronic kidney disease with heart failure and with stage 5 chronic kidney disease, or end stage renal disease: Secondary | ICD-10-CM | POA: Diagnosis not present

## 2023-11-12 DIAGNOSIS — M5416 Radiculopathy, lumbar region: Secondary | ICD-10-CM | POA: Diagnosis not present

## 2023-11-12 DIAGNOSIS — N186 End stage renal disease: Secondary | ICD-10-CM | POA: Diagnosis not present

## 2023-11-12 DIAGNOSIS — E1122 Type 2 diabetes mellitus with diabetic chronic kidney disease: Secondary | ICD-10-CM | POA: Diagnosis not present

## 2023-11-12 DIAGNOSIS — I96 Gangrene, not elsewhere classified: Secondary | ICD-10-CM | POA: Diagnosis not present

## 2023-11-12 DIAGNOSIS — D631 Anemia in chronic kidney disease: Secondary | ICD-10-CM | POA: Diagnosis not present

## 2023-11-12 DIAGNOSIS — I509 Heart failure, unspecified: Secondary | ICD-10-CM | POA: Diagnosis not present

## 2023-11-13 DIAGNOSIS — D509 Iron deficiency anemia, unspecified: Secondary | ICD-10-CM | POA: Diagnosis not present

## 2023-11-13 DIAGNOSIS — E1129 Type 2 diabetes mellitus with other diabetic kidney complication: Secondary | ICD-10-CM | POA: Diagnosis not present

## 2023-11-13 DIAGNOSIS — D631 Anemia in chronic kidney disease: Secondary | ICD-10-CM | POA: Diagnosis not present

## 2023-11-13 DIAGNOSIS — N2581 Secondary hyperparathyroidism of renal origin: Secondary | ICD-10-CM | POA: Diagnosis not present

## 2023-11-13 DIAGNOSIS — Z992 Dependence on renal dialysis: Secondary | ICD-10-CM | POA: Diagnosis not present

## 2023-11-13 DIAGNOSIS — N186 End stage renal disease: Secondary | ICD-10-CM | POA: Diagnosis not present

## 2023-11-14 ENCOUNTER — Other Ambulatory Visit: Payer: Self-pay

## 2023-11-14 ENCOUNTER — Encounter (HOSPITAL_COMMUNITY): Payer: Self-pay | Admitting: Vascular Surgery

## 2023-11-14 DIAGNOSIS — I509 Heart failure, unspecified: Secondary | ICD-10-CM | POA: Diagnosis not present

## 2023-11-14 DIAGNOSIS — E1122 Type 2 diabetes mellitus with diabetic chronic kidney disease: Secondary | ICD-10-CM | POA: Diagnosis not present

## 2023-11-14 DIAGNOSIS — N186 End stage renal disease: Secondary | ICD-10-CM | POA: Diagnosis not present

## 2023-11-14 DIAGNOSIS — D631 Anemia in chronic kidney disease: Secondary | ICD-10-CM | POA: Diagnosis not present

## 2023-11-14 DIAGNOSIS — I132 Hypertensive heart and chronic kidney disease with heart failure and with stage 5 chronic kidney disease, or end stage renal disease: Secondary | ICD-10-CM | POA: Diagnosis not present

## 2023-11-14 DIAGNOSIS — I96 Gangrene, not elsewhere classified: Secondary | ICD-10-CM | POA: Diagnosis not present

## 2023-11-14 NOTE — Progress Notes (Signed)
 SDW CALL  Patient's wife was given pre-op instructions over the phone. The opportunity was given for the patient's wife to ask questions. No further questions asked. Patient's wife verbalized understanding of instructions given.   PCP - Phlip Asenso Cardiologist - Florestine Avers  PPM/ICD - denies Device Orders - n/a Rep Notified - n/a  Chest x-ray - 07/04/23 EKG - 11/29/22 Stress Test -  ECHO - 11/09/18 Cardiac Cath -   Sleep Study - OSA+ but does not wear CPAP CPAP -   Patient does not check blood sugars at home.  Patient has had no issue with blood sugar since heart transplant.   Last dose of GLP1 agonist-  n/a GLP1 instructions: n/a  Blood Thinner Instructions:  Eliquis to be stopped 3 days prior to surgery - last dose will be 3/21 - wife is aware Aspirin Instructions:  n/a  ERAS Protcol - NPO PRE-SURGERY Ensure or G2- n/a  COVID TEST-  n/a   Anesthesia review: yes - heart transplant, CHG, MI, HTN,  Patient's wife denies the patient has shortness of breath, fever, cough and chest pain over the phone call   All instructions explained to the patient's wife, with a verbal understanding of the material. Patient's wife agrees to go over the instructions while at home for a better understanding.    Teach back method used and patient's wife repeated back day of surgery and time of arrival and medications to take the morning of the procedure.

## 2023-11-15 DIAGNOSIS — N2581 Secondary hyperparathyroidism of renal origin: Secondary | ICD-10-CM | POA: Diagnosis not present

## 2023-11-15 DIAGNOSIS — D509 Iron deficiency anemia, unspecified: Secondary | ICD-10-CM | POA: Diagnosis not present

## 2023-11-15 DIAGNOSIS — Z992 Dependence on renal dialysis: Secondary | ICD-10-CM | POA: Diagnosis not present

## 2023-11-15 DIAGNOSIS — D631 Anemia in chronic kidney disease: Secondary | ICD-10-CM | POA: Diagnosis not present

## 2023-11-15 DIAGNOSIS — E1129 Type 2 diabetes mellitus with other diabetic kidney complication: Secondary | ICD-10-CM | POA: Diagnosis not present

## 2023-11-15 DIAGNOSIS — N186 End stage renal disease: Secondary | ICD-10-CM | POA: Diagnosis not present

## 2023-11-15 NOTE — Progress Notes (Signed)
 Anesthesia Chart Review: Same day workup  63 year old male with pertinent history including heart transplant (02/28/13 for non-ischemic cardiomyopathy with HFrEF, ACID, LBBB, afib, LVAD; post-operative HIT, calcineurin nephrotoxicity),  HTN, GERD, HLD, DM2, ESRD (07/2013, HD MWF), prostate cancer (s/p Gold seeds 06/30/18), GERD, CVA (04/2012), OSA not on CPAP, bariatric surgery (laparoscopic gastric banding in 2008->sleeve gastrectomy 11/15/16), dementia with behavioral disturbance.   Last visit with cardiac transplant center at [facility] was 10/24/2023.  At that time noted to be having some decline in his overall function, progressive neurological process that seem to be worsening as well as some challenges with lower extremity gangrene.  He was still noted to be tolerating dialysis okay with no cardiopulmonary complaints.  Echo at that time showed normal graft function.  Given recent infection/gangrene he was recommended to stop Myfortic and resume prednisone 5 mg daily.  Regarding his neurologic issues, note states "Neurologic issues - This remains a mystery. Unfortunately it appears we are dealing with a progressive condition and in a more palliative stage of the disease. He is being supported by local hospice care services but is choosing to continue HD at this time."  He was recommended to follow-up with the transplant team on an as-needed basis; continue routine echoes.  Patient previously had right forearm fistula converted to graft which subsequently thrombosed he was currently dialyzing via catheter.  Patient will need day of surgery labs and evaluation.  EKG 11/29/2022: Sinus rhythm.  Rate 98. RBBB and LAFB  TTE 10/24/2023 (Care Everywhere): CONCLUSION -------------------------------------------------------------------------------  NORMAL LEFT VENTRICULAR SYSTOLIC FUNCTION WITH MILD LVH  ESTIMATED EF: >55%  INDETERMINATE DIASTOLIC FUNCTION  NORMAL RIGHT VENTRICULAR SYSTOLIC FUNCTION  VALVULAR  REGURGITATION: No AR, TRIVIAL MR, TRIVIAL PR, TRIVIAL TR  NO VALVULAR STENOSIS   Compared with prior Echo study on   04/18/2023 : NO SIGNIFICANT CHANGE     CT chest 10/25/2022 (Care Everywhere): IMPRESSION:   1.  Interval resolution of the previously noted left upper lobe predominant  tree-in-bud nodularity.  2.  Similar bibasilar atelectasis/scarring.  3.  Unchanged marked dilation of the pulmonary artery.   Coronary CTA with FFR analysis 04/27/2022 (Care Everywhere): Left Main Artery:          Site of luminal narrowing: Mild calcified plaque without stenosis.          Distal vessel: 1.0  Left Anterior Descending System:          Site of luminal narrowing:  1.  50% stenosis in the mid-LAD: 0.92          Distal vessel: 0.85  Left Circumflex System:          Site of luminal narrowing:  1.  Greater than 70% stenosis in the proximal LCx: 0.89  2.  Greater than 50% stenosis in the obtuse marginal branch: 0.88          Distal vessel: 0.79  Right Coronary Artery system: Not modeled    Impression:  1. No flow limiting stenosis in the LAD or LCx.  2.  Please note, FFR unable to be calculated in the right coronary artery  system due to technical limitations.      Zannie Cove [facility] Short Stay Center/Anesthesiology Phone [phone] 11/15/2023 12:28 PM

## 2023-11-15 NOTE — Anesthesia Preprocedure Evaluation (Addendum)
 Anesthesia Evaluation  Patient identified by MRN, date of birth, ID band Patient awake    Reviewed: Allergy & Precautions, NPO status , Patient's Chart, lab work & pertinent test results  Airway Mallampati: III  TM Distance: >3 FB Neck ROM: Full    Dental  (+) Teeth Intact, Dental Advisory Given   Pulmonary sleep apnea    Pulmonary exam normal breath sounds clear to auscultation       Cardiovascular hypertension, Pt. on medications + Past MI and +CHF  Normal cardiovascular exam+ dysrhythmias (LBBB) Atrial Fibrillation  Rhythm:Regular Rate:Normal  S/p heart transplant 2014     Neuro/Psych  PSYCHIATRIC DISORDERS Anxiety Depression   Dementia  Neuromuscular disease CVA    GI/Hepatic Neg liver ROS,GERD  ,,S/p lap band    Endo/Other  diabetes  Obesity   Renal/GU ESRF and DialysisRenal disease     Musculoskeletal  (+) Arthritis , Osteoarthritis,    Abdominal   Peds  Hematology  (+) Blood dyscrasia (Eliquis)   Anesthesia Other Findings Day of surgery medications reviewed with the patient.  Reproductive/Obstetrics                             Anesthesia Physical Anesthesia Plan  ASA: 4  Anesthesia Plan: General   Post-op Pain Management:    Induction: Intravenous  PONV Risk Score and Plan: 2 and Ondansetron, Midazolam and Treatment may vary due to age or medical condition  Airway Management Planned: LMA  Additional Equipment:   Intra-op Plan:   Post-operative Plan: Extubation in OR  Informed Consent: I have reviewed the patients History and Physical, chart, labs and discussed the procedure including the risks, benefits and alternatives for the proposed anesthesia with the patient or authorized representative who has indicated his/her understanding and acceptance.     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  Last visit with cardiac transplant center at [facility] was 10/24/2023.  At that time noted to be having some decline in his overall function, progressive neurological process that seem to be worsening as well as some challenges with lower extremity gangrene.  He was still noted to be tolerating dialysis okay with no cardiopulmonary complaints.  Echo at that time showed normal graft function.  Given recent infection/gangrene he was recommended to stop Myfortic and resume prednisone 5 mg daily.  Regarding his neurologic issues, note states "Neurologic issues - This remains a mystery. Unfortunately it appears we are dealing with a progressive condition and in a more palliative stage of the disease. He is being supported by local hospice care services but is choosing to continue HD at this time."  He was recommended to follow-up with the transplant team on an as-needed basis; continue routine echoes.  Patient previously had right forearm fistula converted to graft which subsequently thrombosed he was currently dialyzing via catheter.  Patient will need day of surgery labs and evaluation.  EKG 11/29/2022: Sinus rhythm.  Rate 98. RBBB and LAFB  TTE 10/24/2023 (Care Everywhere): CONCLUSION -------------------------------------------------------------------------------  NORMAL LEFT VENTRICULARSYSTOLIC FUNCTION WITH MILD LVH  ESTIMATED EF: >  55%  INDETERMINATE DIASTOLIC FUNCTION  NORMAL RIGHT VENTRICULAR SYSTOLIC FUNCTION  VALVULAR REGURGITATION: No AR, TRIVIAL MR, TRIVIAL PR,  TRIVIAL TR  NO VALVULAR STENOSIS   Compared with prior Echo study on  04/18/2023 : NO SIGNIFICANT CHANGE    CT chest 10/25/2022 (Care Everywhere): IMPRESSION:   1. Interval resolution of the previously noted left upper lobe predominant  tree-in-bud nodularity.  2. Similar bibasilar atelectasis/scarring.  3. Unchanged marked dilation of the pulmonary artery.   Coronary CTA with FFR analysis 04/27/2022 (Care Everywhere): Left Main Artery:     Site of luminal narrowing: Mild calcified plaque without stenosis.     Distal vessel: 1.0  Left Anterior Descending System:     Site of luminal narrowing:  1. 50% stenosis in the mid-LAD: 0.92     Distal vessel: 0.85  LeftCircumflex System:     Site of luminal narrowing:  1. Greater than 70% stenosis in the proximal LCx: 0.89  2. Greater than 50% stenosis in the obtuse marginal branch: 0.88     Distal vessel: 0.79  Right Coronary Artery system: Not modeled    Impression:  1.No flow limiting stenosis in the LAD or LCx.  2. Please note, FFR unable to be calculated in the right coronary artery  system due to technical limitations.   )       Anesthesia Quick Evaluation

## 2023-11-18 DIAGNOSIS — D509 Iron deficiency anemia, unspecified: Secondary | ICD-10-CM | POA: Diagnosis not present

## 2023-11-18 DIAGNOSIS — Z992 Dependence on renal dialysis: Secondary | ICD-10-CM | POA: Diagnosis not present

## 2023-11-18 DIAGNOSIS — N2581 Secondary hyperparathyroidism of renal origin: Secondary | ICD-10-CM | POA: Diagnosis not present

## 2023-11-18 DIAGNOSIS — D631 Anemia in chronic kidney disease: Secondary | ICD-10-CM | POA: Diagnosis not present

## 2023-11-18 DIAGNOSIS — N186 End stage renal disease: Secondary | ICD-10-CM | POA: Diagnosis not present

## 2023-11-18 DIAGNOSIS — E1129 Type 2 diabetes mellitus with other diabetic kidney complication: Secondary | ICD-10-CM | POA: Diagnosis not present

## 2023-11-18 NOTE — Progress Notes (Signed)
 voicemail left to notify patient of arrival time change. Patien to arrve at 0600 for surgery on 11/19/2023.

## 2023-11-19 ENCOUNTER — Ambulatory Visit (HOSPITAL_COMMUNITY): Payer: Self-pay | Admitting: Physician Assistant

## 2023-11-19 ENCOUNTER — Encounter (HOSPITAL_COMMUNITY): Payer: Self-pay | Admitting: Vascular Surgery

## 2023-11-19 ENCOUNTER — Other Ambulatory Visit: Payer: Self-pay

## 2023-11-19 ENCOUNTER — Ambulatory Visit (HOSPITAL_BASED_OUTPATIENT_CLINIC_OR_DEPARTMENT_OTHER): Payer: Self-pay | Admitting: Physician Assistant

## 2023-11-19 ENCOUNTER — Ambulatory Visit (HOSPITAL_COMMUNITY)
Admission: RE | Admit: 2023-11-19 | Discharge: 2023-11-19 | Disposition: A | Discharge status: Home / Self Care | Attending: Vascular Surgery | Admitting: Vascular Surgery

## 2023-11-19 ENCOUNTER — Encounter (HOSPITAL_COMMUNITY)
Admission: RE | Disposition: A | Discharge status: Home / Self Care | Payer: Self-pay | Source: Home / Self Care | Attending: Vascular Surgery

## 2023-11-19 DIAGNOSIS — G4733 Obstructive sleep apnea (adult) (pediatric): Secondary | ICD-10-CM | POA: Insufficient documentation

## 2023-11-19 DIAGNOSIS — E1122 Type 2 diabetes mellitus with diabetic chronic kidney disease: Secondary | ICD-10-CM | POA: Diagnosis not present

## 2023-11-19 DIAGNOSIS — I132 Hypertensive heart and chronic kidney disease with heart failure and with stage 5 chronic kidney disease, or end stage renal disease: Secondary | ICD-10-CM | POA: Diagnosis not present

## 2023-11-19 DIAGNOSIS — Z941 Heart transplant status: Secondary | ICD-10-CM | POA: Insufficient documentation

## 2023-11-19 DIAGNOSIS — E669 Obesity, unspecified: Secondary | ICD-10-CM | POA: Insufficient documentation

## 2023-11-19 DIAGNOSIS — Z8673 Personal history of transient ischemic attack (TIA), and cerebral infarction without residual deficits: Secondary | ICD-10-CM | POA: Diagnosis not present

## 2023-11-19 DIAGNOSIS — N186 End stage renal disease: Secondary | ICD-10-CM | POA: Diagnosis not present

## 2023-11-19 DIAGNOSIS — F32A Depression, unspecified: Secondary | ICD-10-CM | POA: Insufficient documentation

## 2023-11-19 DIAGNOSIS — I5022 Chronic systolic (congestive) heart failure: Secondary | ICD-10-CM | POA: Insufficient documentation

## 2023-11-19 DIAGNOSIS — T82868A Thrombosis of vascular prosthetic devices, implants and grafts, initial encounter: Secondary | ICD-10-CM | POA: Insufficient documentation

## 2023-11-19 DIAGNOSIS — F419 Anxiety disorder, unspecified: Secondary | ICD-10-CM | POA: Diagnosis not present

## 2023-11-19 DIAGNOSIS — X58XXXA Exposure to other specified factors, initial encounter: Secondary | ICD-10-CM | POA: Insufficient documentation

## 2023-11-19 DIAGNOSIS — I120 Hypertensive chronic kidney disease with stage 5 chronic kidney disease or end stage renal disease: Secondary | ICD-10-CM | POA: Diagnosis not present

## 2023-11-19 DIAGNOSIS — I4891 Unspecified atrial fibrillation: Secondary | ICD-10-CM | POA: Diagnosis not present

## 2023-11-19 DIAGNOSIS — I447 Left bundle-branch block, unspecified: Secondary | ICD-10-CM | POA: Insufficient documentation

## 2023-11-19 DIAGNOSIS — I252 Old myocardial infarction: Secondary | ICD-10-CM | POA: Diagnosis not present

## 2023-11-19 DIAGNOSIS — I509 Heart failure, unspecified: Secondary | ICD-10-CM

## 2023-11-19 DIAGNOSIS — Z8546 Personal history of malignant neoplasm of prostate: Secondary | ICD-10-CM | POA: Diagnosis not present

## 2023-11-19 DIAGNOSIS — Z9884 Bariatric surgery status: Secondary | ICD-10-CM | POA: Insufficient documentation

## 2023-11-19 DIAGNOSIS — Z6829 Body mass index (BMI) 29.0-29.9, adult: Secondary | ICD-10-CM | POA: Insufficient documentation

## 2023-11-19 DIAGNOSIS — Z87891 Personal history of nicotine dependence: Secondary | ICD-10-CM | POA: Diagnosis not present

## 2023-11-19 DIAGNOSIS — K219 Gastro-esophageal reflux disease without esophagitis: Secondary | ICD-10-CM | POA: Diagnosis not present

## 2023-11-19 DIAGNOSIS — Z992 Dependence on renal dialysis: Secondary | ICD-10-CM | POA: Insufficient documentation

## 2023-11-19 DIAGNOSIS — G4730 Sleep apnea, unspecified: Secondary | ICD-10-CM | POA: Diagnosis not present

## 2023-11-19 DIAGNOSIS — N185 Chronic kidney disease, stage 5: Secondary | ICD-10-CM

## 2023-11-19 HISTORY — PX: AV FISTULA PLACEMENT: SHX1204

## 2023-11-19 LAB — POCT I-STAT, CHEM 8
BUN: 44 mg/dL — ABNORMAL HIGH (ref 8–23)
Calcium, Ion: 1.15 mmol/L (ref 1.15–1.40)
Chloride: 101 mmol/L (ref 98–111)
Creatinine, Ser: 7.4 mg/dL — ABNORMAL HIGH (ref 0.61–1.24)
Glucose, Bld: 76 mg/dL (ref 70–99)
HCT: 39 % (ref 39.0–52.0)
Hemoglobin: 13.3 g/dL (ref 13.0–17.0)
Potassium: 4.7 mmol/L (ref 3.5–5.1)
Sodium: 138 mmol/L (ref 135–145)
TCO2: 31 mmol/L (ref 22–32)

## 2023-11-19 LAB — GLUCOSE, CAPILLARY
Glucose-Capillary: 54 mg/dL — ABNORMAL LOW (ref 70–99)
Glucose-Capillary: 65 mg/dL — ABNORMAL LOW (ref 70–99)
Glucose-Capillary: 68 mg/dL — ABNORMAL LOW (ref 70–99)
Glucose-Capillary: 96 mg/dL (ref 70–99)
Glucose-Capillary: 96 mg/dL (ref 70–99)

## 2023-11-19 SURGERY — ARTERIOVENOUS (AV) FISTULA CREATION
Anesthesia: General | Site: Arm Upper | Laterality: Right

## 2023-11-19 MED ORDER — SODIUM CHLORIDE 0.9% FLUSH: 10.0000 mL | Freq: Two times a day (BID) | INTRAVENOUS | Status: DC

## 2023-11-19 MED ORDER — ONDANSETRON HCL 4 MG/2ML IJ SOLN
INTRAMUSCULAR | Status: AC
  Filled 2023-11-19: qty 2

## 2023-11-19 MED ORDER — CHLORHEXIDINE GLUCONATE 4 % EX SOLN: 60.0000 mL | Freq: Once | CUTANEOUS | Status: DC

## 2023-11-19 MED ORDER — LIDOCAINE 2% (20 MG/ML) 5 ML SYRINGE
INTRAMUSCULAR | Status: DC | PRN
  Administered 2023-11-19: 40 mg via INTRAVENOUS

## 2023-11-19 MED ORDER — OXYCODONE-ACETAMINOPHEN 5-325 MG PO TABS: 1.0000 | ORAL_TABLET | Freq: Four times a day (QID) | ORAL | 0 refills | Status: AC | PRN

## 2023-11-19 MED ORDER — CHLORHEXIDINE GLUCONATE CLOTH 2 % EX PADS: 6.0000 | MEDICATED_PAD | Freq: Every day | CUTANEOUS | Status: DC

## 2023-11-19 MED ORDER — ORAL CARE MOUTH RINSE: 15.0000 mL | Freq: Once | OROMUCOSAL | Status: AC

## 2023-11-19 MED ORDER — ONDANSETRON HCL 4 MG/2ML IJ SOLN
INTRAMUSCULAR | Status: DC | PRN
  Administered 2023-11-19: 4 mg via INTRAVENOUS

## 2023-11-19 MED ORDER — EPHEDRINE SULFATE-NACL 50-0.9 MG/10ML-% IV SOSY
PREFILLED_SYRINGE | INTRAVENOUS | Status: DC | PRN
  Administered 2023-11-19: 5 mg via INTRAVENOUS
  Administered 2023-11-19 (×2): 10 mg via INTRAVENOUS

## 2023-11-19 MED ORDER — OXYCODONE HCL 5 MG/5ML PO SOLN: 5.0000 mg | Freq: Once | ORAL | Status: DC | PRN

## 2023-11-19 MED ORDER — SODIUM CHLORIDE 0.9 % IV SOLN: INTRAVENOUS | Status: DC | PRN

## 2023-11-19 MED ORDER — SODIUM CHLORIDE 0.9% FLUSH: 10.0000 mL | INTRAVENOUS | Status: DC | PRN

## 2023-11-19 MED ORDER — OXYCODONE HCL 5 MG PO TABS: 5.0000 mg | ORAL_TABLET | Freq: Once | ORAL | Status: DC | PRN

## 2023-11-19 MED ORDER — DEXAMETHASONE SODIUM PHOSPHATE 10 MG/ML IJ SOLN
INTRAMUSCULAR | Status: DC | PRN
  Administered 2023-11-19: 10 mg via INTRAVENOUS

## 2023-11-19 MED ORDER — DEXTROSE 50 % IV SOLN
INTRAVENOUS | Status: AC
Start: 2023-11-19 — End: 2023-11-19
  Administered 2023-11-19: 25 g via INTRAVENOUS
  Filled 2023-11-19: qty 50

## 2023-11-19 MED ORDER — AMISULPRIDE (ANTIEMETIC) 5 MG/2ML IV SOLN: 10.0000 mg | Freq: Once | INTRAVENOUS | Status: DC | PRN

## 2023-11-19 MED ORDER — PHENYLEPHRINE HCL-NACL 20-0.9 MG/250ML-% IV SOLN
INTRAVENOUS | Status: DC | PRN
  Administered 2023-11-19: 30 ug/min via INTRAVENOUS

## 2023-11-19 MED ORDER — DEXTROSE 50 % IV SOLN
12.5000 g | INTRAVENOUS | Status: AC
  Administered 2023-11-19: 12.5 g via INTRAVENOUS

## 2023-11-19 MED ORDER — PROPOFOL 10 MG/ML IV BOLUS
INTRAVENOUS | Status: DC | PRN
  Administered 2023-11-19: 150 mg via INTRAVENOUS

## 2023-11-19 MED ORDER — SODIUM CHLORIDE 0.9 % IV SOLN: 12.5000 mg | INTRAVENOUS | Status: DC | PRN

## 2023-11-19 MED ORDER — 0.9 % SODIUM CHLORIDE (POUR BTL) OPTIME
TOPICAL | Status: DC | PRN
  Administered 2023-11-19: 1000 mL

## 2023-11-19 MED ORDER — VANCOMYCIN HCL IN DEXTROSE 1-5 GM/200ML-% IV SOLN
1000.0000 mg | INTRAVENOUS | Status: AC
  Administered 2023-11-19: 1000 mg via INTRAVENOUS
  Filled 2023-11-19: qty 200

## 2023-11-19 MED ORDER — SODIUM CHLORIDE 0.9% FLUSH: 3.0000 mL | Freq: Two times a day (BID) | INTRAVENOUS | Status: DC

## 2023-11-19 MED ORDER — PHENYLEPHRINE 80 MCG/ML (10ML) SYRINGE FOR IV PUSH (FOR BLOOD PRESSURE SUPPORT)
PREFILLED_SYRINGE | INTRAVENOUS | Status: DC | PRN
  Administered 2023-11-19: 160 ug via INTRAVENOUS
  Administered 2023-11-19 (×2): 80 ug via INTRAVENOUS
  Administered 2023-11-19: 160 ug via INTRAVENOUS
  Administered 2023-11-19 (×2): 80 ug via INTRAVENOUS

## 2023-11-19 MED ORDER — ANTICOAGULANT SODIUM CITRATE 4% (200MG/5ML) IV SOLN
5.0000 mL | Freq: Once | Status: AC
  Administered 2023-11-19: 1.6 mL
  Filled 2023-11-19: qty 5

## 2023-11-19 MED ORDER — DEXTROSE 50 % IV SOLN: 25.0000 g | INTRAVENOUS | Status: AC

## 2023-11-19 MED ORDER — DEXAMETHASONE SODIUM PHOSPHATE 10 MG/ML IJ SOLN
INTRAMUSCULAR | Status: AC
  Filled 2023-11-19: qty 1

## 2023-11-19 MED ORDER — LIDOCAINE 2% (20 MG/ML) 5 ML SYRINGE
INTRAMUSCULAR | Status: AC
  Filled 2023-11-19: qty 5

## 2023-11-19 MED ORDER — PROPOFOL 10 MG/ML IV BOLUS
INTRAVENOUS | Status: AC
  Filled 2023-11-19: qty 20

## 2023-11-19 MED ORDER — FENTANYL CITRATE (PF) 250 MCG/5ML IJ SOLN
INTRAMUSCULAR | Status: DC | PRN
Start: 2023-11-19 — End: 2023-11-19
  Administered 2023-11-19 (×3): 25 ug via INTRAVENOUS

## 2023-11-19 MED ORDER — CHLORHEXIDINE GLUCONATE 0.12 % MT SOLN
15.0000 mL | Freq: Once | OROMUCOSAL | Status: AC
  Administered 2023-11-19: 15 mL via OROMUCOSAL
  Filled 2023-11-19: qty 15

## 2023-11-19 MED ORDER — HYDROMORPHONE HCL 1 MG/ML IJ SOLN: 0.2500 mg | INTRAMUSCULAR | Status: DC | PRN

## 2023-11-19 MED ORDER — FENTANYL CITRATE (PF) 250 MCG/5ML IJ SOLN
INTRAMUSCULAR | Status: AC
  Filled 2023-11-19: qty 5

## 2023-11-19 SURGICAL SUPPLY — 30 items
ARMBAND PINK RESTRICT EXTREMIT (MISCELLANEOUS) ×2 IMPLANT
BAG COUNTER SPONGE SURGICOUNT (BAG) ×2 IMPLANT
CANISTER SUCT 3000ML PPV (MISCELLANEOUS) ×2 IMPLANT
CLIP LIGATING EXTRA MED SLVR (CLIP) ×2 IMPLANT
CLIP LIGATING EXTRA SM BLUE (MISCELLANEOUS) ×2 IMPLANT
COVER PROBE W GEL 5X96 (DRAPES) IMPLANT
DERMABOND ADVANCED .7 DNX12 (GAUZE/BANDAGES/DRESSINGS) ×2 IMPLANT
DERMABOND ADVANCED .7 DNX6 (GAUZE/BANDAGES/DRESSINGS) IMPLANT
ELECT REM PT RETURN 9FT ADLT (ELECTROSURGICAL) ×1 IMPLANT
ELECTRODE REM PT RTRN 9FT ADLT (ELECTROSURGICAL) ×2 IMPLANT
GLOVE BIO SURGEON STRL SZ7.5 (GLOVE) ×2 IMPLANT
GOWN STRL REUS W/ TWL LRG LVL3 (GOWN DISPOSABLE) ×4 IMPLANT
GOWN STRL REUS W/ TWL XL LVL3 (GOWN DISPOSABLE) ×2 IMPLANT
INSERT FOGARTY SM (MISCELLANEOUS) IMPLANT
KIT BASIN OR (CUSTOM PROCEDURE TRAY) ×2 IMPLANT
KIT TURNOVER KIT B (KITS) ×2 IMPLANT
MAT PREVALON FULL STRYKER (MISCELLANEOUS) IMPLANT
NS IRRIG 1000ML POUR BTL (IV SOLUTION) ×2 IMPLANT
PACK CV ACCESS (CUSTOM PROCEDURE TRAY) ×2 IMPLANT
PAD ARMBOARD POSITIONER FOAM (MISCELLANEOUS) ×4 IMPLANT
POWDER SURGICEL 3.0 GRAM (HEMOSTASIS) IMPLANT
SLING ARM FOAM STRAP LRG (SOFTGOODS) IMPLANT
SLING ARM FOAM STRAP MED (SOFTGOODS) IMPLANT
SUT MNCRL AB 4-0 PS2 18 (SUTURE) ×2 IMPLANT
SUT PROLENE 5 0 C 1 36 (SUTURE) IMPLANT
SUT PROLENE 6 0 BV (SUTURE) ×2 IMPLANT
SUT VIC AB 3-0 SH 27X BRD (SUTURE) ×2 IMPLANT
TOWEL GREEN STERILE (TOWEL DISPOSABLE) ×2 IMPLANT
UNDERPAD 30X36 HEAVY ABSORB (UNDERPADS AND DIAPERS) ×2 IMPLANT
WATER STERILE IRR 1000ML POUR (IV SOLUTION) ×2 IMPLANT

## 2023-11-19 NOTE — Transfer of Care (Signed)
 Immediate Anesthesia Transfer of Care Note  Patient: [name]  Procedure(s) Performed: RIGHT BRACHIOCEPHALIC ARTERIOVENOUS (AV) FISTULA CREATION (Right: Arm Upper)  Patient Location: PACU  Anesthesia Type:General  Level of Consciousness: drowsy  Airway & Oxygen Therapy: Patient Spontanous Breathing and Patient connected to nasal cannula oxygen  Post-op Assessment: Report given to RN and Post -op Vital signs reviewed and stable  Post vital signs: Reviewed and stable  Last Vitals:  Vitals Value Taken Time  BP 93/69 11/19/23 0945  Temp 36.6 C 11/19/23 0945  Pulse 86 11/19/23 0949  Resp 12 11/19/23 0949  SpO2 97 % 11/19/23 0949  Vitals shown include unfiled device data.  Last Pain:  Vitals:   11/19/23 0945  TempSrc:   PainSc: Asleep      Patients Stated Pain Goal: 0 (11/19/23 0715)  Complications: No notable events documented.

## 2023-11-19 NOTE — Op Note (Signed)
    Patient name: [name] MRN: [id number] DOB: [birth year] Sex: male  11/19/2023 Pre-operative Diagnosis: End stage renal disease Post-operative diagnosis:  Same Surgeon:  Apolinar Junes C. Randie Heinz, MD Assistant: Doreatha Massed, PA Procedure Performed: Right brachial artery to cephalic vein AV fistula creation  Indications: 63 year old male with history of end-stage renal disease currently dialyzing via catheter.  He has a thrombosed right forearm AV fistula that underwent multiple previous open and endovascular operations.  He does have a large cephalic vein runoff in the upper arm and we have discussed proceeding with fistula versus graft in the right upper arm and he demonstrates good understanding and has agreed to proceed and consent was signed.  An experienced assistant was necessary to facilitate exposure of both the vein and the artery at the antecubitum as well as performing anastomosis.  Findings: The cephalic vein was approximately 6 mm diameter at the area that we created anastomosis.  Brachial artery measured over 1 cm diameter.  At completion there was a strong thrill in the upper arm fistula and a palpable radial artery pulse at the wrist.   Procedure:  The patient was identified in the holding area and taken to the operating room where he was placed supine on the operative table and general anesthesia was induced.  He was sterilely prepped and draped in the right upper extremity in the usual fashion, antibiotics were administered and a timeout was called.  We began using ultrasound and identified a suitable cephalic vein with branching just below the antecubitum and was quite a bit more patulous and this more cephalad.  There was a very large brachial artery just below this and the deep fascia.  A transverse incision was created.  We dissected out the vein divided 1 very large branch between ties attempted to cinch down the patulous area with tying.  We then marked the vein for orientation.   We dissected through the deep fascia identified a very large palpable brachial artery and encircled this with a vessel loop.  The vein was transected distally and tied off.  This was spatulated flushed with saline as patient has previous history of HIT and heparin was not used.  We then clamped the artery distally and proximally opened longitudinally and flushed with saline distally.  We then sewed the vein into side with 6-0 Prolene suture.  Prior to completion we allowed flushing all directions.  Upon completion 1 repair stitch was required.  We obtain hemostasis in the wound and closed in layers with Vicryl and Monocryl.  Dermabond is placed at the skin level.  He was awakened from anesthesia having tolerated the procedure without immediate complication.  All counts were correct at completion.  EBL: 20 cc    Curtis Uriarte C. Randie Heinz, MD [facility] Office: [phone] Pager: [phone]

## 2023-11-19 NOTE — Interval H&P Note (Signed)
 History and Physical Interval Note:  11/19/2023 8:22 AM  [name]  has presented today for surgery, with the diagnosis of End Stage Renal Disease.  The various methods of treatment have been discussed with the patient and family. After consideration of risks, benefits and other options for treatment, the patient has consented to  Procedure(s): ARTERIOVENOUS (AV) FISTULA CREATION (Right) as a surgical intervention.  The patient's history has been reviewed, patient examined, no change in status, stable for surgery.  I have reviewed the patient's chart and labs.  Questions were answered to the patient's satisfaction.     [name]

## 2023-11-19 NOTE — Progress Notes (Signed)
 IVT consult placed for alternative to Heparin for HD cath. Patient has allergy to heparin noted in chart. Instructed to place order for Sodium citrate intracatheter per policy. If any concerns or questions, contact pharmacy or IVT.   Jobie Popp Loyola Mast, RN

## 2023-11-19 NOTE — Discharge Instructions (Signed)
   [facility]  Discharge Instructions  AV Fistula or Graft Surgery for Dialysis Access  Please refer to the following instructions for your post-procedure care. Your surgeon or physician assistant will discuss any changes with you.  Activity  You may drive the day following your surgery, if you are comfortable and no longer taking prescription pain medication. Resume full activity as the soreness in your incision resolves.  Bathing/Showering  You may shower after you go home. Keep your incision dry for 48 hours. Do not soak in a bathtub, hot tub, or swim until the incision heals completely. You may not shower if you have a hemodialysis catheter.  Incision Care  Clean your incision with mild soap and water after 48 hours. Pat the area dry with a clean towel. You do not need a bandage unless otherwise instructed. Do not apply any ointments or creams to your incision. You may have skin glue on your incision. Do not peel it off. It will come off on its own in about one week. Your arm may swell a bit after surgery. To reduce swelling use pillows to elevate your arm so it is above your heart. Your doctor will tell you if you need to lightly wrap your arm with an ACE bandage.  Diet  Resume your normal diet. There are not special food restrictions following this procedure. In order to heal from your surgery, it is CRITICAL to get adequate nutrition. Your body requires vitamins, minerals, and protein. Vegetables are the best source of vitamins and minerals. Vegetables also provide the perfect balance of protein. Processed food has little nutritional value, so try to avoid this.  Medications  Resume taking all of your medications. If your incision is causing pain, you may take over-the counter pain relievers such as acetaminophen (Tylenol). If you were prescribed a stronger pain medication, please be aware these medications can cause nausea and constipation. Prevent  nausea by taking the medication with a snack or meal. Avoid constipation by drinking plenty of fluids and eating foods with high amount of fiber, such as fruits, vegetables, and grains.  Do not take Tylenol if you are taking prescription pain medications.  Follow up Your surgeon may want to see you in the office following your access surgery. If so, this will be arranged at the time of your surgery.  Please call us immediately for any of the following conditions:  Increased pain, redness, drainage (pus) from your incision site Fever of 101 degrees or higher Severe or worsening pain at your incision site Hand pain or numbness.  Reduce your risk of vascular disease:  Stop smoking. If you would like help, call [facility] at 1-800-QUIT-NOW ((450)142-9544) or [place] at [phone]  Manage your cholesterol Maintain a desired weight Control your diabetes Keep your blood pressure down  Dialysis  It will take several weeks to several months for your new dialysis access to be ready for use. Your surgeon will determine when it is okay to use it. Your nephrologist will continue to direct your dialysis. You can continue to use your Permcath until your new access is ready for use.   11/19/2023 [name] [id number] 06-03-1961  Surgeon(s): Maeola Harman, MD  Procedure(s): RIGHT BRACHIOCEPHALIC ARTERIOVENOUS (AV) FISTULA CREATION  x Do not stick fistula for 12 weeks    If you have any questions, please call the office at [phone].

## 2023-11-19 NOTE — Anesthesia Postprocedure Evaluation (Signed)
 Anesthesia Post Note  Patient: [name]  Procedure(s) Performed: RIGHT BRACHIOCEPHALIC ARTERIOVENOUS (AV) FISTULA CREATION (Right: Arm Upper)     Patient location during evaluation: PACU Anesthesia Type: General Level of consciousness: awake and alert Pain management: pain level controlled Vital Signs Assessment: post-procedure vital signs reviewed and stable Respiratory status: spontaneous breathing, nonlabored ventilation and respiratory function stable Cardiovascular status: blood pressure returned to baseline and stable Postop Assessment: no apparent nausea or vomiting Anesthetic complications: no   No notable events documented.  Last Vitals:  Vitals:   11/19/23 1030 11/19/23 1045  BP: (!) 83/61 (!) 82/58  Pulse: 84 85  Resp: 14 13  Temp:  36.4 C  SpO2: 95% 97%    Last Pain:  Vitals:   11/19/23 1045  TempSrc:   PainSc: 0-No pain                 Lowella Curb

## 2023-11-19 NOTE — Anesthesia Procedure Notes (Signed)
 Procedure Name: LMA Insertion Date/Time: 11/19/2023 8:39 AM  Performed by: Colbert Coyer, CRNAPre-anesthesia Checklist: Patient identified, Emergency Drugs available, Suction available and Patient being monitored Patient Re-evaluated:Patient Re-evaluated prior to induction Oxygen Delivery Method: Circle System Utilized Preoxygenation: Pre-oxygenation with 100% oxygen Induction Type: IV induction Ventilation: Mask ventilation without difficulty LMA: LMA inserted LMA Size: 5.0 Number of attempts: 1 Placement Confirmation: positive ETCO2 Tube secured with: Tape Dental Injury: Teeth and Oropharynx as per pre-operative assessment

## 2023-11-20 ENCOUNTER — Encounter (HOSPITAL_COMMUNITY): Payer: Self-pay | Admitting: Vascular Surgery

## 2023-11-20 DIAGNOSIS — N186 End stage renal disease: Secondary | ICD-10-CM | POA: Diagnosis not present

## 2023-11-20 DIAGNOSIS — Z992 Dependence on renal dialysis: Secondary | ICD-10-CM | POA: Diagnosis not present

## 2023-11-20 DIAGNOSIS — N2581 Secondary hyperparathyroidism of renal origin: Secondary | ICD-10-CM | POA: Diagnosis not present

## 2023-11-20 DIAGNOSIS — D631 Anemia in chronic kidney disease: Secondary | ICD-10-CM | POA: Diagnosis not present

## 2023-11-20 DIAGNOSIS — E1129 Type 2 diabetes mellitus with other diabetic kidney complication: Secondary | ICD-10-CM | POA: Diagnosis not present

## 2023-11-20 DIAGNOSIS — D509 Iron deficiency anemia, unspecified: Secondary | ICD-10-CM | POA: Diagnosis not present

## 2023-11-21 DIAGNOSIS — I509 Heart failure, unspecified: Secondary | ICD-10-CM | POA: Diagnosis not present

## 2023-11-21 DIAGNOSIS — I132 Hypertensive heart and chronic kidney disease with heart failure and with stage 5 chronic kidney disease, or end stage renal disease: Secondary | ICD-10-CM | POA: Diagnosis not present

## 2023-11-21 DIAGNOSIS — D631 Anemia in chronic kidney disease: Secondary | ICD-10-CM | POA: Diagnosis not present

## 2023-11-21 DIAGNOSIS — E1122 Type 2 diabetes mellitus with diabetic chronic kidney disease: Secondary | ICD-10-CM | POA: Diagnosis not present

## 2023-11-21 DIAGNOSIS — I96 Gangrene, not elsewhere classified: Secondary | ICD-10-CM | POA: Diagnosis not present

## 2023-11-21 DIAGNOSIS — N186 End stage renal disease: Secondary | ICD-10-CM | POA: Diagnosis not present

## 2023-11-22 DIAGNOSIS — N186 End stage renal disease: Secondary | ICD-10-CM | POA: Diagnosis not present

## 2023-11-22 DIAGNOSIS — D631 Anemia in chronic kidney disease: Secondary | ICD-10-CM | POA: Diagnosis not present

## 2023-11-22 DIAGNOSIS — E1122 Type 2 diabetes mellitus with diabetic chronic kidney disease: Secondary | ICD-10-CM | POA: Diagnosis not present

## 2023-11-22 DIAGNOSIS — I509 Heart failure, unspecified: Secondary | ICD-10-CM | POA: Diagnosis not present

## 2023-11-22 DIAGNOSIS — I132 Hypertensive heart and chronic kidney disease with heart failure and with stage 5 chronic kidney disease, or end stage renal disease: Secondary | ICD-10-CM | POA: Diagnosis not present

## 2023-11-22 DIAGNOSIS — E1129 Type 2 diabetes mellitus with other diabetic kidney complication: Secondary | ICD-10-CM | POA: Diagnosis not present

## 2023-11-22 DIAGNOSIS — I96 Gangrene, not elsewhere classified: Secondary | ICD-10-CM | POA: Diagnosis not present

## 2023-11-22 DIAGNOSIS — D509 Iron deficiency anemia, unspecified: Secondary | ICD-10-CM | POA: Diagnosis not present

## 2023-11-22 DIAGNOSIS — N2581 Secondary hyperparathyroidism of renal origin: Secondary | ICD-10-CM | POA: Diagnosis not present

## 2023-11-22 DIAGNOSIS — Z992 Dependence on renal dialysis: Secondary | ICD-10-CM | POA: Diagnosis not present

## 2023-11-24 ENCOUNTER — Encounter (HOSPITAL_COMMUNITY): Payer: Self-pay

## 2023-11-24 ENCOUNTER — Emergency Department (HOSPITAL_COMMUNITY)

## 2023-11-24 ENCOUNTER — Observation Stay (HOSPITAL_COMMUNITY)
Admission: EM | Admit: 2023-11-24 | Discharge: 2023-11-27 | Disposition: A | Discharge status: Home Health | Attending: Internal Medicine | Admitting: Internal Medicine

## 2023-11-24 ENCOUNTER — Other Ambulatory Visit: Payer: Self-pay

## 2023-11-24 DIAGNOSIS — R4182 Altered mental status, unspecified: Secondary | ICD-10-CM | POA: Diagnosis not present

## 2023-11-24 DIAGNOSIS — F0390 Unspecified dementia without behavioral disturbance: Secondary | ICD-10-CM | POA: Insufficient documentation

## 2023-11-24 DIAGNOSIS — D500 Iron deficiency anemia secondary to blood loss (chronic): Secondary | ICD-10-CM | POA: Insufficient documentation

## 2023-11-24 DIAGNOSIS — R0989 Other specified symptoms and signs involving the circulatory and respiratory systems: Secondary | ICD-10-CM | POA: Diagnosis not present

## 2023-11-24 DIAGNOSIS — I132 Hypertensive heart and chronic kidney disease with heart failure and with stage 5 chronic kidney disease, or end stage renal disease: Secondary | ICD-10-CM | POA: Insufficient documentation

## 2023-11-24 DIAGNOSIS — Z941 Heart transplant status: Secondary | ICD-10-CM | POA: Insufficient documentation

## 2023-11-24 DIAGNOSIS — J189 Pneumonia, unspecified organism: Secondary | ICD-10-CM | POA: Insufficient documentation

## 2023-11-24 DIAGNOSIS — Z96641 Presence of right artificial hip joint: Secondary | ICD-10-CM | POA: Diagnosis not present

## 2023-11-24 DIAGNOSIS — Z8673 Personal history of transient ischemic attack (TIA), and cerebral infarction without residual deficits: Secondary | ICD-10-CM | POA: Insufficient documentation

## 2023-11-24 DIAGNOSIS — R531 Weakness: Secondary | ICD-10-CM | POA: Diagnosis present

## 2023-11-24 DIAGNOSIS — N186 End stage renal disease: Secondary | ICD-10-CM | POA: Diagnosis not present

## 2023-11-24 DIAGNOSIS — R2231 Localized swelling, mass and lump, right upper limb: Secondary | ICD-10-CM | POA: Diagnosis not present

## 2023-11-24 DIAGNOSIS — I10 Essential (primary) hypertension: Secondary | ICD-10-CM | POA: Diagnosis not present

## 2023-11-24 DIAGNOSIS — R066 Hiccough: Secondary | ICD-10-CM | POA: Insufficient documentation

## 2023-11-24 DIAGNOSIS — I5022 Chronic systolic (congestive) heart failure: Secondary | ICD-10-CM | POA: Diagnosis not present

## 2023-11-24 DIAGNOSIS — R251 Tremor, unspecified: Secondary | ICD-10-CM | POA: Diagnosis not present

## 2023-11-24 DIAGNOSIS — G9341 Metabolic encephalopathy: Secondary | ICD-10-CM | POA: Diagnosis not present

## 2023-11-24 DIAGNOSIS — J111 Influenza due to unidentified influenza virus with other respiratory manifestations: Principal | ICD-10-CM

## 2023-11-24 DIAGNOSIS — J101 Influenza due to other identified influenza virus with other respiratory manifestations: Secondary | ICD-10-CM | POA: Insufficient documentation

## 2023-11-24 DIAGNOSIS — D696 Thrombocytopenia, unspecified: Secondary | ICD-10-CM | POA: Insufficient documentation

## 2023-11-24 DIAGNOSIS — J811 Chronic pulmonary edema: Secondary | ICD-10-CM | POA: Diagnosis not present

## 2023-11-24 DIAGNOSIS — E1142 Type 2 diabetes mellitus with diabetic polyneuropathy: Secondary | ICD-10-CM | POA: Diagnosis not present

## 2023-11-24 DIAGNOSIS — Z872 Personal history of diseases of the skin and subcutaneous tissue: Secondary | ICD-10-CM | POA: Insufficient documentation

## 2023-11-24 DIAGNOSIS — I480 Paroxysmal atrial fibrillation: Secondary | ICD-10-CM | POA: Diagnosis not present

## 2023-11-24 DIAGNOSIS — Z8546 Personal history of malignant neoplasm of prostate: Secondary | ICD-10-CM | POA: Insufficient documentation

## 2023-11-24 DIAGNOSIS — Z7901 Long term (current) use of anticoagulants: Secondary | ICD-10-CM | POA: Insufficient documentation

## 2023-11-24 DIAGNOSIS — Z96651 Presence of right artificial knee joint: Secondary | ICD-10-CM | POA: Insufficient documentation

## 2023-11-24 DIAGNOSIS — Z79899 Other long term (current) drug therapy: Secondary | ICD-10-CM | POA: Diagnosis not present

## 2023-11-24 DIAGNOSIS — R918 Other nonspecific abnormal finding of lung field: Secondary | ICD-10-CM | POA: Diagnosis not present

## 2023-11-24 DIAGNOSIS — G9340 Encephalopathy, unspecified: Secondary | ICD-10-CM | POA: Diagnosis not present

## 2023-11-24 DIAGNOSIS — R059 Cough, unspecified: Secondary | ICD-10-CM | POA: Diagnosis not present

## 2023-11-24 DIAGNOSIS — L97529 Non-pressure chronic ulcer of other part of left foot with unspecified severity: Secondary | ICD-10-CM | POA: Insufficient documentation

## 2023-11-24 DIAGNOSIS — J168 Pneumonia due to other specified infectious organisms: Secondary | ICD-10-CM | POA: Diagnosis not present

## 2023-11-24 HISTORY — DX: End stage renal disease: N18.6

## 2023-11-24 LAB — CBC WITH DIFFERENTIAL/PLATELET
Abs Immature Granulocytes: 0.09 K/uL — ABNORMAL HIGH (ref 0.00–0.07)
Basophils Absolute: 0.1 K/uL (ref 0.0–0.1)
Basophils Relative: 1 %
Eosinophils Absolute: 0.1 K/uL (ref 0.0–0.5)
Eosinophils Relative: 2 %
HCT: 35.5 % — ABNORMAL LOW (ref 39.0–52.0)
Hemoglobin: 11.2 g/dL — ABNORMAL LOW (ref 13.0–17.0)
Immature Granulocytes: 1 %
Lymphocytes Relative: 10 %
Lymphs Abs: 0.7 K/uL (ref 0.7–4.0)
MCH: 31.5 pg (ref 26.0–34.0)
MCHC: 31.5 g/dL (ref 30.0–36.0)
MCV: 99.7 fL (ref 80.0–100.0)
Monocytes Absolute: 1 K/uL (ref 0.1–1.0)
Monocytes Relative: 15 %
Neutro Abs: 4.9 K/uL (ref 1.7–7.7)
Neutrophils Relative %: 71 %
Platelets: 133 K/uL — ABNORMAL LOW (ref 150–400)
RBC: 3.56 MIL/uL — ABNORMAL LOW (ref 4.22–5.81)
RDW: 17.2 % — ABNORMAL HIGH (ref 11.5–15.5)
WBC: 6.8 K/uL (ref 4.0–10.5)
nRBC: 0 % (ref 0.0–0.2)

## 2023-11-24 LAB — COMPREHENSIVE METABOLIC PANEL WITH GFR
ALT: 8 U/L (ref 0–44)
AST: 15 U/L (ref 15–41)
Albumin: 2.8 g/dL — ABNORMAL LOW (ref 3.5–5.0)
Alkaline Phosphatase: 104 U/L (ref 38–126)
Anion gap: 15 (ref 5–15)
BUN: 49 mg/dL — ABNORMAL HIGH (ref 8–23)
CO2: 26 mmol/L (ref 22–32)
Calcium: 8.2 mg/dL — ABNORMAL LOW (ref 8.9–10.3)
Chloride: 95 mmol/L — ABNORMAL LOW (ref 98–111)
Creatinine, Ser: 9.01 mg/dL — ABNORMAL HIGH (ref 0.61–1.24)
GFR, Estimated: 6 mL/min — ABNORMAL LOW
Glucose, Bld: 97 mg/dL (ref 70–99)
Potassium: 5 mmol/L (ref 3.5–5.1)
Sodium: 136 mmol/L (ref 135–145)
Total Bilirubin: 1.2 mg/dL (ref 0.0–1.2)
Total Protein: 6 g/dL — ABNORMAL LOW (ref 6.5–8.1)

## 2023-11-24 LAB — TSH: TSH: 1.132 u[IU]/mL (ref 0.350–4.500)

## 2023-11-24 LAB — CBG MONITORING, ED
Glucose-Capillary: 73 mg/dL (ref 70–99)
Glucose-Capillary: 77 mg/dL (ref 70–99)

## 2023-11-24 LAB — BLOOD GAS, VENOUS
Acid-Base Excess: 0.1 mmol/L (ref 0.0–2.0)
Bicarbonate: 27.6 mmol/L (ref 20.0–28.0)
O2 Saturation: 34.5 %
Patient temperature: 36.8
pCO2, Ven: 55 mmHg (ref 44–60)
pH, Ven: 7.3 (ref 7.25–7.43)
pO2, Ven: <31 mmHg — CL (ref 32–45)

## 2023-11-24 LAB — RENAL FUNCTION PANEL
Albumin: 2.6 g/dL — ABNORMAL LOW (ref 3.5–5.0)
Anion gap: 15 (ref 5–15)
BUN: 51 mg/dL — ABNORMAL HIGH (ref 8–23)
CO2: 24 mmol/L (ref 22–32)
Calcium: 7.9 mg/dL — ABNORMAL LOW (ref 8.9–10.3)
Chloride: 96 mmol/L — ABNORMAL LOW (ref 98–111)
Creatinine, Ser: 9 mg/dL — ABNORMAL HIGH (ref 0.61–1.24)
GFR, Estimated: 6 mL/min — ABNORMAL LOW (ref >60)
Glucose, Bld: 89 mg/dL (ref 70–99)
Phosphorus: 7.1 mg/dL — ABNORMAL HIGH (ref 2.5–4.6)
Potassium: 4.7 mmol/L (ref 3.5–5.1)
Sodium: 135 mmol/L (ref 135–145)

## 2023-11-24 LAB — ETHANOL: Alcohol, Ethyl (B): <10 mg/dL

## 2023-11-24 LAB — RESP PANEL BY RT-PCR (RSV, FLU A&B, COVID)  RVPGX2
Influenza A by PCR: POSITIVE — AB
Influenza B by PCR: NEGATIVE
Resp Syncytial Virus by PCR: NEGATIVE
SARS Coronavirus 2 by RT PCR: NEGATIVE

## 2023-11-24 LAB — AMMONIA: Ammonia: 21 umol/L (ref 9–35)

## 2023-11-24 MED ORDER — OSELTAMIVIR PHOSPHATE 30 MG PO CAPS
30.0000 mg | ORAL_CAPSULE | ORAL | Status: DC
  Administered 2023-11-25: 30 mg via ORAL
  Filled 2023-11-24 (×2): qty 1

## 2023-11-24 MED ORDER — SODIUM CHLORIDE 0.9 % IV BOLUS
500.0000 mL | Freq: Once | INTRAVENOUS | Status: AC
  Administered 2023-11-24: 500 mL via INTRAVENOUS

## 2023-11-24 MED ORDER — PREGABALIN 75 MG PO CAPS
75.0000 mg | ORAL_CAPSULE | Freq: Every day | ORAL | Status: DC
  Administered 2023-11-25 – 2023-11-26 (×2): 75 mg via ORAL
  Filled 2023-11-24 (×2): qty 1

## 2023-11-24 MED ORDER — PREDNISOLONE 5 MG PO TABS
5.0000 mg | ORAL_TABLET | Freq: Every day | ORAL | Status: DC
  Administered 2023-11-25 – 2023-11-26 (×3): 5 mg via ORAL
  Filled 2023-11-24 (×4): qty 1

## 2023-11-24 MED ORDER — DULOXETINE HCL 60 MG PO CPEP
60.0000 mg | ORAL_CAPSULE | Freq: Every day | ORAL | Status: DC
Start: 2023-11-25 — End: 2023-11-27
  Administered 2023-11-25 – 2023-11-26 (×2): 60 mg via ORAL
  Filled 2023-11-24 (×2): qty 1

## 2023-11-24 MED ORDER — MIDODRINE HCL 5 MG PO TABS
10.0000 mg | ORAL_TABLET | Freq: Once | ORAL | Status: AC
  Administered 2023-11-24: 10 mg via ORAL
  Filled 2023-11-24: qty 2

## 2023-11-24 MED ORDER — SODIUM CHLORIDE 0.9 % IV SOLN
500.0000 mg | Freq: Once | INTRAVENOUS | Status: AC
  Administered 2023-11-24: 500 mg via INTRAVENOUS
  Filled 2023-11-24: qty 5

## 2023-11-24 MED ORDER — CYCLOSPORINE MODIFIED (GENGRAF) 25 MG PO CAPS
25.0000 mg | ORAL_CAPSULE | Freq: Two times a day (BID) | ORAL | Status: DC
  Administered 2023-11-25 – 2023-11-26 (×5): 25 mg via ORAL
  Filled 2023-11-24 (×7): qty 1

## 2023-11-24 MED ORDER — DIVALPROEX SODIUM 500 MG PO DR TAB
500.0000 mg | DELAYED_RELEASE_TABLET | Freq: Every day | ORAL | Status: DC
  Administered 2023-11-25 – 2023-11-26 (×3): 500 mg via ORAL
  Filled 2023-11-24 (×4): qty 1

## 2023-11-24 MED ORDER — CYCLOSPORINE MODIFIED (GENGRAF) 25 MG PO CAPS
100.0000 mg | ORAL_CAPSULE | Freq: Two times a day (BID) | ORAL | Status: DC
  Administered 2023-11-25 – 2023-11-26 (×5): 100 mg via ORAL
  Filled 2023-11-24 (×7): qty 4

## 2023-11-24 MED ORDER — SODIUM CHLORIDE 0.9 % IV SOLN
2.0000 g | Freq: Once | INTRAVENOUS | Status: AC
  Administered 2023-11-24: 2 g via INTRAVENOUS
  Filled 2023-11-24: qty 20

## 2023-11-24 MED ORDER — ATORVASTATIN CALCIUM 40 MG PO TABS
40.0000 mg | ORAL_TABLET | Freq: Every day | ORAL | Status: DC
  Administered 2023-11-25 – 2023-11-26 (×2): 40 mg via ORAL
  Filled 2023-11-24 (×2): qty 1

## 2023-11-24 MED ORDER — OSELTAMIVIR PHOSPHATE 30 MG PO CAPS
30.0000 mg | ORAL_CAPSULE | Freq: Once | ORAL | Status: AC
  Administered 2023-11-24: 30 mg via ORAL
  Filled 2023-11-24: qty 1

## 2023-11-24 MED ORDER — DIVALPROEX SODIUM 250 MG PO DR TAB
250.0000 mg | DELAYED_RELEASE_TABLET | Freq: Every morning | ORAL | Status: DC
  Administered 2023-11-25 – 2023-11-26 (×2): 250 mg via ORAL
  Filled 2023-11-24 (×5): qty 1

## 2023-11-24 MED ORDER — MIDODRINE HCL 5 MG PO TABS
10.0000 mg | ORAL_TABLET | Freq: Three times a day (TID) | ORAL | Status: DC
  Administered 2023-11-25 – 2023-11-27 (×9): 10 mg via ORAL
  Filled 2023-11-24 (×9): qty 2

## 2023-11-24 MED ORDER — APIXABAN 2.5 MG PO TABS
2.5000 mg | ORAL_TABLET | Freq: Two times a day (BID) | ORAL | Status: DC
  Administered 2023-11-25 – 2023-11-26 (×5): 2.5 mg via ORAL
  Filled 2023-11-24 (×5): qty 1

## 2023-11-24 MED ORDER — DIVALPROEX SODIUM 250 MG PO DR TAB: 250.0000 mg | DELAYED_RELEASE_TABLET | ORAL | Status: DC

## 2023-11-24 MED ORDER — INSULIN ASPART 100 UNIT/ML IJ SOLN: 0.0000 [IU] | Freq: Three times a day (TID) | INTRAMUSCULAR | Status: DC

## 2023-11-24 MED ORDER — SEVELAMER CARBONATE 800 MG PO TABS
1600.0000 mg | ORAL_TABLET | Freq: Three times a day (TID) | ORAL | Status: DC
Start: 2023-11-25 — End: 2023-11-27
  Administered 2023-11-25 – 2023-11-26 (×5): 1600 mg via ORAL
  Filled 2023-11-24 (×5): qty 2

## 2023-11-24 MED ORDER — ARIPIPRAZOLE 5 MG PO TABS
10.0000 mg | ORAL_TABLET | Freq: Every morning | ORAL | Status: DC
  Administered 2023-11-25 – 2023-11-26 (×2): 10 mg via ORAL
  Filled 2023-11-24 (×2): qty 2

## 2023-11-24 MED ORDER — CYCLOSPORINE MODIFIED (NEORAL) 100 MG PO CAPS: 125.0000 mg | ORAL_CAPSULE | ORAL | Status: DC

## 2023-11-24 NOTE — ED Triage Notes (Signed)
 Patient's family reports that since coming home from dialysis on Friday he has had hiccups, had swelling to RUE, and has been more lethargic than normal. Family states he is improved today from yesterday but is still not back to his baseline. Surgery to prepare fistula in RUE last Tuesday but no swelling at the time.

## 2023-11-24 NOTE — Progress Notes (Signed)
 New Admission Note: 76m04   Arrival Method: ED stretcher Mental Orientation: A/OX3, time Telemetry: box 4 Assessment:completed Skin: intact IV: left forearm Pain: n/a Tubes: n/a Safety Measures: bed alarm, non skid socks Admission:  Orientation: Patient has been oriented to the room, unit and staff.  Family: n/a Belongings: at bedside  Orders have been reviewed and implemented. Will continue to monitor the patient. Call light has been placed within reach and bed alarm has been activated.   Fabian Sharp BSN, RN-BC Phone number: [phone]

## 2023-11-24 NOTE — Progress Notes (Signed)
 [facility] ED [address] [facility] Liaison Note  This is a Home Based Primary Care patient with Civil engineer, contracting. In the absence of any acute needs, Civil engineer, contracting can follow up at home tomorrow with any lab work, Veterinary surgeon, Music therapist.  Please call should there be any questions.  Thank you, Haynes Bast, BSN, [facility] [phone]

## 2023-11-24 NOTE — ED Notes (Signed)
 Phleb stuck twice and was unable to get blood

## 2023-11-24 NOTE — ED Provider Notes (Addendum)
 [place] EMERGENCY DEPARTMENT AT [facility] Provider Note  CSN: [id number] Arrival date & time: 11/24/23 1320  Chief Complaint(s) Altered Mental Status  HPI [name] is a 63 y.o. male history of fibrillation, heart transplant, dementia, hypertension, hyperlipidemia, obesity, end-stage renal disease on dialysis presenting to the emergency department with generalized weakness.  Patient's family report that since Friday, patient has been more sleepy and weak.  They have also noticed that he has had some hiccups and cough.  No fevers or chills.  Not complaining of any chest pain, abdominal pain, nausea, vomiting, diarrhea.  Does not make urine still.  No headaches.  No other new symptom.   Did have recent right upper extremity fistula surgery earlier this week   Past Medical History Past Medical History:  Diagnosis Date   Anxiety    Arthritis    Atrial fibrillation (HCC)    CHF (congestive heart failure), NYHA class III (HCC)    s/p heart transplant   Chronic kidney disease    end stage   Chronic systolic dysfunction of left ventricle    CVA (cerebral infarction) 2010   Dementia (HCC)    Depression    Diabetes mellitus    PMH; Prior to heart transplant   Family history of coronary artery disease    in both parents   GERD (gastroesophageal reflux disease)    Hyperlipidemia    Hypertension    Left bundle branch block    Morbid obesity (HCC)    status post lap band   Myocardial infarction [facility])    prior to heart transplant   Nonischemic cardiomyopathy (HCC)    prior to heart transplant   Obesity (BMI 30-39.9)    Obstructive sleep apnea    no longer needs CPAP after heart transplant per pt   Premature ventricular contractions    Prostate cancer (HCC)    SOB (shortness of breath)    Patient Active Problem List   Diagnosis Date Noted   Diabetic foot infection (HCC) 09/14/2023   Secondary hypertension 09/13/2023   Gangrene of foot (HCC) 09/12/2023   Toe  ulcer due to DM (HCC) 09/06/2023   Goals of care, counseling/discussion 09/06/2023   Toe ulcer, left, with necrosis of muscle (HCC) 09/06/2023   AMS (altered mental status) 11/29/2022   Aspiration pneumonia (HCC) 11/29/2022   PNA (pneumonia) 11/29/2022   Peripheral autonomic neuropathy due to DM (HCC) 11/01/2022   Orthostatic hypotension 11/01/2022   Acute encephalopathy 07/04/2022   Chronic kidney disease, stage 5 (HCC) 11/14/2021   Gait abnormality 10/12/2021   Polyneuropathy associated with underlying disease (HCC) 10/12/2021   Neuropathic pain 10/12/2021   Morbid obesity (HCC) 11/19/2019   Sleep disorder 10/22/2019   Sleep disturbance 10/22/2019   Abnormality of gait 06/09/2019   Diabetic polyneuropathy associated with diabetes mellitus due to underlying condition (HCC) 04/14/2019   Peripheral neuropathy 12/11/2018   CVA (cerebral vascular accident) (HCC) 11/09/2018   Heart transplant recipient [facility]) 11/09/2018   Malignant neoplasm of prostate (HCC) 05/06/2018   Closed posterior dislocation of hip, right, initial encounter (HCC) 08/24/2017   End stage renal disease on dialysis (HCC) 08/24/2017   Hip dislocation, right (HCC) 08/24/2017   Osteoarthritis of right hip 07/22/2017   VENTRICULAR TACHYCARDIA 12/21/2009   Dyslipidemia 08/30/2009   Essential hypertension 08/30/2009   ISCHEMIC CARDIOMYOPATHY 08/30/2009   Atrial fibrillation (HCC) 08/30/2009   VENTRICULAR ECTOPY 08/30/2009   History of CVA (cerebrovascular accident) 08/30/2009   Obesity, Class III, BMI 40-49.9 (morbid obesity) (  HCC) 08/30/2009   DM type 2 with diabetic peripheral neuropathy (HCC) 10/17/2007   OBSTRUCTIVE SLEEP APNEA 10/17/2007   CARDIOMYOPATHY, DILATED 10/17/2007   Home Medication(s) Prior to Admission medications   Medication Sig Start Date End Date Taking? Authorizing Provider  acetaminophen (TYLENOL) 500 MG tablet Take 1,000 mg by mouth 2 (two) times daily as needed for moderate pain (pain score  4-6).    [provider]  ARIPiprazole (ABILIFY) 10 MG tablet Take 10 mg by mouth every morning.    [provider]  atorvastatin (LIPITOR) 40 MG tablet Take 1 tablet (40 mg total) by mouth daily. 09/16/23   Carmina Miller, DO  cinacalcet (SENSIPAR) 30 MG tablet Take 4 tablets (120 mg total) by mouth daily with supper. Patient not taking: Reported on 11/14/2023 09/16/23   Carmina Miller, DO  cycloSPORINE modified (NEORAL) 100 MG capsule Take 100 mg by mouth See admin instructions. Take with 25 mg for a total of 125 mg twice daily    [provider]  cycloSPORINE modified 25 MG CAPS Take 25 mg by mouth See admin instructions. Take with 100 mg for a total of 125 mg twice daily 05/09/23   [provider]  divalproex (DEPAKOTE) 125 MG DR tablet Take 250 mg by mouth in morning and 500 mg by mouth at night Patient taking differently: Take 250-500 mg by mouth See admin instructions. Take 250 mg by mouth in morning and 500 mg by mouth at night 12/17/22   Levert Feinstein, MD  DULoxetine (CYMBALTA) 60 MG capsule TAKE 1 CAPSULE(60 MG) BY MOUTH DAILY 10/23/23   Levert Feinstein, MD  ELIQUIS 2.5 MG TABS tablet Take 1 tablet (2.5 mg total) by mouth 2 (two) times daily. 04/30/21   Jerilee Field, MD  gentamicin cream (GARAMYCIN) 0.1 % Apply 1 Application topically 2 (two) times daily. Patient not taking: Reported on 11/14/2023 10/02/23   Felecia Shelling, DPM  hydrocortisone (ANUSOL-HC) 2.5 % rectal cream Apply 1 Application topically as needed for hemorrhoids or anal itching. 11/01/22   [provider]  ibuprofen (ADVIL) 200 MG tablet Take 400 mg by mouth as needed for moderate pain (pain score 4-6) or mild pain (pain score 1-3).    [provider]  midodrine (PROAMATINE) 10 MG tablet Take 1 tablet (10 mg total) by mouth 3 (three) times daily with meals. 09/16/23   Carmina Miller, DO  naloxone [facility]) nasal spray 4 mg/0.1 mL Place 0.4 mg into the nose once as needed (opioid overdose).     [provider]  oxyCODONE-acetaminophen (PERCOCET) 5-325 MG tablet Take 1 tablet by mouth every 6 (six) hours as needed (pain). Do not take with other opiate pain medication 11/19/23 11/18/24  Dara Lords, PA-C  prednisoLONE 5 MG TABS tablet Take 5 mg by mouth daily.    [provider]  pregabalin (LYRICA) 75 MG capsule Take 1 capsule (75 mg total) by mouth daily. 09/16/23   Carmina Miller, DO  sevelamer carbonate (RENVELA) 800 MG tablet Take 2 tablets (1,600 mg total) by mouth 3 (three) times daily with meals. 11/11/18   Darlin Drop, DO  traZODone (DESYREL) 100 MG tablet Take 100 mg by mouth at bedtime. 08/18/22   [provider]  Past Surgical History Past Surgical History:  Procedure Laterality Date   A/V FISTULAGRAM Right 05/23/2023   Procedure: A/V Fistulagram;  Surgeon: Victorino Sparrow, MD;  Location[facility] INVASIVE CV LAB;  Service: Cardiovascular;  Laterality: Right;   AV FISTULA PLACEMENT Right 07/04/2023   Procedure: ARTERIOVENOUS (AV) FISTULA  REVISION WITH APPLICATION OF ARTEGRAFT RIGHT ARM;  Surgeon: Maeola Harman, MD;  [facility] OR;  Service: Vascular;  Laterality: Right;   AV FISTULA PLACEMENT Right 11/19/2023   Procedure: RIGHT BRACHIOCEPHALIC ARTERIOVENOUS (AV) FISTULA CREATION;  Surgeon: Maeola Harman, MD;  Location: [facility] OR;  Service: Vascular;  Laterality: Right;   CARDIAC PACEMAKER PLACEMENT  09/21/2009   Biventricular implantable cardioverter-defibrillator implantation      COLONOSCOPY W/ BIOPSIES AND POLYPECTOMY     CYSTOSCOPY WITH FULGERATION N/A 04/27/2021   Procedure: CYSTOSCOPY AND CLOT EVACUATION WITH BLADDER BIOPSY/ FUGARATION OF BLADDER/ FULGARATION OF PROSTATE ;  Surgeon: Jerilee Field, MD;  Location: [facility] ORS;  Service: Urology;  Laterality: N/A;   FOOT SURGERY     left   HEART  TRANSPLANT  2014   INSERTION OF DIALYSIS CATHETER Right 07/04/2023   Procedure: INSERTION OF DIALYSIS CATHETER WITH 19 CM PALINDROME;  Surgeon: Maeola Harman, MD;  Location: [facility] OR;  Service: Vascular;  Laterality: Right;   LAPAROSCOPIC GASTRIC BANDING  01/27/2007   LEFT VENTRICULAR ASSIST DEVICE     implanted at [facility]   PACEMAKER REMOVAL     PROSTATE BIOPSY     TOTAL HIP ARTHROPLASTY Right 07/22/2017   Procedure: RIGHT TOTAL HIP ARTHROPLASTY ANTERIOR APPROACH;  Surgeon: Samson Frederic, MD;  Location: [facility] OR;  Service: Orthopedics;  Laterality: Right;  Needs RNFA   TOTAL KNEE ARTHROPLASTY Right 07/22/2017   Family History Family History  Problem Relation Age of Onset   Coronary artery disease Father        had, PTCA & CABG   Heart attack Father    Hypertension Father    Diabetes Father    Prostate cancer Father    Coronary artery disease Mother    Heart attack Mother    Hypertension Mother    Stroke Mother    Lupus Mother    Prostate cancer Brother    Coronary artery disease Brother    Coronary artery disease Sister    Heart attack Sister    Prostate cancer Paternal Uncle    Coronary artery disease Maternal Grandmother    Coronary artery disease Maternal Grandfather    Coronary artery disease Paternal Grandmother    Coronary artery disease Paternal Grandfather     Social History Social History   Tobacco Use   Smoking status: Never   Smokeless tobacco: Former    Types: Snuff  Vaping Use   Vaping status: Never Used  Substance Use Topics   Alcohol use: No   Drug use: No   Allergies Heparin, Iodinated contrast media, and Penicillin g potassium [penicillin g]  Review of Systems Review of Systems  Physical Exam Vital Signs  I have reviewed the triage vital signs BP (!) 82/62   Pulse 82   Temp 98.4 F (36.9 C) (Oral)   Resp 15   SpO2 94%  Physical Exam Vitals and nursing note reviewed.  Constitutional:      General: He is not in acute distress.     Comments: Somnolent but arousable, denies any complaints  HENT:     Mouth/Throat:     Mouth: Mucous membranes are moist.  Eyes:  Conjunctiva/sclera: Conjunctivae normal.  Cardiovascular:     Rate and Rhythm: Normal rate and regular rhythm.  Pulmonary:     Effort: Pulmonary effort is normal. No respiratory distress.     Breath sounds: Normal breath sounds.  Abdominal:     General: Abdomen is flat.     Palpations: Abdomen is soft.     Tenderness: There is no abdominal tenderness.  Musculoskeletal:     Right lower leg: Edema present.     Left lower leg: Edema present.     Comments: Very mild right upper extremity edema, AC incision site clean and dry, intact.  Fistula in right upper extremity with palpable thrill.  2+ distal radial pulse.  Skin:    General: Skin is warm and dry.     Capillary Refill: Capillary refill takes less than 2 seconds.     Comments: No foot wound bilaterally  Neurological:     Comments: Moves all 4 extremities equally, no facial droop, answering questions and following commands  Psychiatric:        Mood and Affect: Mood normal.        Behavior: Behavior normal.     ED Results and Treatments Labs (all labs ordered are listed, but only abnormal results are displayed) Labs Reviewed  RESP PANEL BY RT-PCR (RSV, FLU A&B, COVID)  RVPGX2 - Abnormal; Notable for the following components:      Result Value   Influenza A by PCR POSITIVE (*)    All other components within normal limits  COMPREHENSIVE METABOLIC PANEL WITH GFR - Abnormal; Notable for the following components:   Chloride 95 (*)    BUN 49 (*)    Creatinine, Ser 9.01 (*)    Calcium 8.2 (*)    Total Protein 6.0 (*)    Albumin 2.8 (*)    GFR, Estimated 6 (*)    All other components within normal limits  CBC WITH DIFFERENTIAL/PLATELET - Abnormal; Notable for the following components:   RBC 3.56 (*)    Hemoglobin 11.2 (*)    HCT 35.5 (*)    RDW 17.2 (*)    Platelets 133 (*)    Abs Immature  Granulocytes 0.09 (*)    All other components within normal limits  CULTURE, BLOOD (ROUTINE X 2)  CULTURE, BLOOD (ROUTINE X 2)  AMMONIA  TSH  ETHANOL  I-STAT VENOUS BLOOD GAS, ED                                                                                                                          Radiology CT Chest Wo Contrast Result Date: 11/24/2023 CLINICAL DATA:  Pneumonia, complication suspected, xray done. Cough. EXAM: CT CHEST WITHOUT CONTRAST TECHNIQUE: Multidetector CT imaging of the chest was performed following the standard protocol without IV contrast. RADIATION DOSE REDUCTION: This exam was performed according to the departmental dose-optimization program which includes automated exposure control, adjustment of the mA and/or kV according to patient size and/or use of iterative  reconstruction technique. COMPARISON:  11/29/2022.  Chest x-ray today FINDINGS: Cardiovascular: Cardiomegaly. Diffuse coronary artery disease, prior CABG. Aorta normal caliber with moderate aortic atherosclerosis. Markedly enlarged main and central pulmonary arteries. Mediastinum/Nodes: No mediastinal, hilar, or axillary adenopathy. Trachea and esophagus are unremarkable. Thyroid unremarkable. Lungs/Pleura: Left lower lobe airspace opacity could reflect atelectasis or pneumonia. This is less pronounced than on prior study. Linear atelectasis or scarring in the right lung base. No effusions. Upper Abdomen: No acute findings Musculoskeletal: Chest wall soft tissues are unremarkable. No acute bony abnormality. IMPRESSION: Left lower lobe airspace opacity again noted, less pronounced than prior study. This could reflect atelectasis or pneumonia. Cardiomegaly, diffuse coronary artery disease, status post CABG. Markedly dilated pulmonary arteries compatible with pulmonary arterial hypertension. This is stable since prior study. Aortic Atherosclerosis (ICD10-I70.0). Electronically Signed   By: Charlett Nose M.D.   On:  11/24/2023 19:07   CT Head Wo Contrast Result Date: 11/24/2023 CLINICAL DATA:  Mental status change, unknown cause EXAM: CT HEAD WITHOUT CONTRAST TECHNIQUE: Contiguous axial images were obtained from the base of the skull through the vertex without intravenous contrast. RADIATION DOSE REDUCTION: This exam was performed according to the departmental dose-optimization program which includes automated exposure control, adjustment of the mA and/or kV according to patient size and/or use of iterative reconstruction technique. COMPARISON:  06/19/2023 FINDINGS: Brain: Old right posterior frontal infarct, stable. There is atrophy and chronic small vessel disease changes. No acute intracranial abnormality. Specifically, no hemorrhage, hydrocephalus, mass lesion, acute infarction, or significant intracranial injury. Vascular: No hyperdense vessel or unexpected calcification. Skull: No acute calvarial abnormality. Sinuses/Orbits: No acute findings Other: None IMPRESSION: Old right frontal infarct. Atrophy, chronic microvascular disease. No acute intracranial abnormality. Electronically Signed   By: Charlett Nose M.D.   On: 11/24/2023 19:04   DG Chest Port 1 View Result Date: 11/24/2023 CLINICAL DATA:  Cough. EXAM: PORTABLE CHEST 1 VIEW COMPARISON:  Chest x-ray dated July 04, 2023. FINDINGS: Unchanged tunneled right internal jugular dialysis catheter. Stable cardiomegaly and central pulmonary artery enlargement. Worsened pulmonary vascular congestion and mild interstitial thickening since the prior study. Increased atelectasis versus consolidation in the retrocardiac left lower lobe. No pneumothorax or large pleural effusion. No acute osseous abnormality. IMPRESSION: 1. Worsened mild pulmonary edema. 2. Increased atelectasis versus consolidation in the retrocardiac left lower lobe. Electronically Signed   By: Obie Dredge M.D.   On: 11/24/2023 15:01    Pertinent labs & imaging results that were available during my  care of the patient were reviewed by me and considered in my medical decision making (see MDM for details).  Medications Ordered in ED Medications  cefTRIAXone (ROCEPHIN) 2 g in sodium chloride 0.9 % 100 mL IVPB (2 g Intravenous New Bag/Given 11/24/23 1922)  azithromycin (ZITHROMAX) 500 mg in sodium chloride 0.9 % 250 mL IVPB (has no administration in time range)  oseltamivir (TAMIFLU) capsule 30 mg (has no administration in time range)  midodrine (PROAMATINE) tablet 10 mg (has no administration in time range)  sodium chloride 0.9 % bolus 500 mL (has no administration in time range)  sodium chloride 0.9 % bolus 500 mL (500 mLs Intravenous New Bag/Given 11/24/23 1847)  Procedures .Critical Care  Performed by: Lonell Grandchild, MD Authorized by: Lonell Grandchild, MD   Critical care provider statement:    Critical care time (minutes):  30   Critical care was necessary to treat or prevent imminent or life-threatening deterioration of the following conditions:  Metabolic crisis and CNS failure or compromise   Critical care was time spent personally by me on the following activities:  Development of treatment plan with patient or surrogate, discussions with consultants, evaluation of patient's response to treatment, examination of patient, ordering and review of laboratory studies, ordering and review of radiographic studies, ordering and performing treatments and interventions, pulse oximetry, re-evaluation of patient's condition and review of old charts   Care discussed with: admitting provider     (including critical care time)  Medical Decision Making / ED Course   MDM:  63 year old presenting to the emergency department generalized weakness, altered mental status.  Patient in no acute distress, vitals with mild hypotension.  Appears somewhat  chronic.  Differential includes occult infectious process, does have cough, will check chest x-ray to evaluate for pneumonia, will send fluid COVID swab.  No sign of skin or soft tissue infection, foot ulcer.  Does not make urine.  No rashes.  Surgical site appears to be healing well without signs of infection.  Differential includes toxic metabolic process, will check labs including ammonia, TSH.  Differential also includes intracranial process will obtain CT head.  Will reassess.  Clinical Course as of 11/24/23 1940  Wynelle Link Nov 24, 2023  1935 Workup with positive flu test.  Given retrocardiac opacity, CT scan obtained which also showed retrocardiac opacity.  It appears in close proximity diaphragm which could be causing hiccups.  Given immunosuppressed state will cover with antibiotics although no leukocytosis or fever here.  Will give dose of Tamiflu.  Discussed with patient's wife, she worries that patient was very weak and may need to be in the hospital.  I think this seems reasonable.  Does have some low blood pressure as well although suspect related to his chronic renal failure.  He is on home midodrine, will give dose now.  Will also give some small amount of fluids.  Discussed with internal medicine teaching service will admit the patient. [WS]    Clinical Course User Index [WS] Lonell Grandchild, MD     Additional history obtained: -Additional history obtained from spouse -External records from outside source obtained and reviewed including: Chart review including previous notes, labs, imaging, consultation notes including prior notes    Lab Tests: -I ordered, reviewed, and interpreted labs.   The pertinent results include:   Labs Reviewed  RESP PANEL BY RT-PCR (RSV, FLU A&B, COVID)  RVPGX2 - Abnormal; Notable for the following components:      Result Value   Influenza A by PCR POSITIVE (*)    All other components within normal limits  COMPREHENSIVE METABOLIC PANEL WITH GFR -  Abnormal; Notable for the following components:   Chloride 95 (*)    BUN 49 (*)    Creatinine, Ser 9.01 (*)    Calcium 8.2 (*)    Total Protein 6.0 (*)    Albumin 2.8 (*)    GFR, Estimated 6 (*)    All other components within normal limits  CBC WITH DIFFERENTIAL/PLATELET - Abnormal; Notable for the following components:   RBC 3.56 (*)    Hemoglobin 11.2 (*)    HCT 35.5 (*)    RDW 17.2 (*)  Platelets 133 (*)    Abs Immature Granulocytes 0.09 (*)    All other components within normal limits  CULTURE, BLOOD (ROUTINE X 2)  CULTURE, BLOOD (ROUTINE X 2)  AMMONIA  TSH  ETHANOL  I-STAT VENOUS BLOOD GAS, ED    Notable for +flu, signs of ESRD  EKG   EKG Interpretation Date/Time:  Sunday November 24 2023 17:38:54 EDT Ventricular Rate:  88 PR Interval:  161 QRS Duration:  146 QT Interval:  416 QTC Calculation: 504 R Axis:   -66  Text Interpretation: Sinus rhythm Atrial premature complex RBBB and LAFB Confirmed by Alvino Blood ([id number]) on 11/24/2023 5:40:44 PM         Imaging Studies ordered: I ordered imaging studies including CT head, CT chest On my interpretation imaging demonstrates pneumonia I independently visualized and interpreted imaging. I agree with the radiologist interpretation   Medicines ordered and prescription drug management: Meds ordered this encounter  Medications   sodium chloride 0.9 % bolus 500 mL   cefTRIAXone (ROCEPHIN) 2 g in sodium chloride 0.9 % 100 mL IVPB    Antibiotic Indication::   CAP   azithromycin (ZITHROMAX) 500 mg in sodium chloride 0.9 % 250 mL IVPB   oseltamivir (TAMIFLU) capsule 30 mg   midodrine (PROAMATINE) tablet 10 mg   sodium chloride 0.9 % bolus 500 mL    -I have reviewed the patients home medicines and have made adjustments as needed   Consultations Obtained: I requested consultation with the internal medicine team,  and discussed lab and imaging findings as well as pertinent plan - they recommend: admit     Cardiac Monitoring: The patient was maintained on a cardiac monitor.  I personally viewed and interpreted the cardiac monitored which showed an underlying rhythm of: NSR  Social Determinants of Health:  Diagnosis or treatment significantly limited by social determinants of health: obesity   Reevaluation: After the interventions noted above, I reevaluated the patient and found that their symptoms have improved  Co morbidities that complicate the patient evaluation  Past Medical History:  Diagnosis Date   Anxiety    Arthritis    Atrial fibrillation (HCC)    CHF (congestive heart failure), NYHA class III (HCC)    s/p heart transplant   Chronic kidney disease    end stage   Chronic systolic dysfunction of left ventricle    CVA (cerebral infarction) 2010   Dementia (HCC)    Depression    Diabetes mellitus    PMH; Prior to heart transplant   Family history of coronary artery disease    in both parents   GERD (gastroesophageal reflux disease)    Hyperlipidemia    Hypertension    Left bundle branch block    Morbid obesity (HCC)    status post lap band   Myocardial infarction [facility])    prior to heart transplant   Nonischemic cardiomyopathy (HCC)    prior to heart transplant   Obesity (BMI 30-39.9)    Obstructive sleep apnea    no longer needs CPAP after heart transplant per pt   Premature ventricular contractions    Prostate cancer (HCC)    SOB (shortness of breath)       Dispostion: Disposition decision including need for hospitalization was considered, and patient admitted to the hospital.    Final Clinical Impression(s) / ED Diagnoses Final diagnoses:  Influenza  Pneumonia of left lower lobe due to infectious organism  Acute metabolic encephalopathy     This chart was  dictated using voice recognition software.  Despite best efforts to proofread,  errors can occur which can change the documentation meaning.    Lonell Grandchild, MD 11/24/23 1940     Lonell Grandchild, MD 11/24/23 [phone]

## 2023-11-24 NOTE — H&P (Cosign Needed)
 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 Ninetta Lights, Lacretia Leigh, MD      First Contact: Dr. Kathleen Lime    Second Contact: Dr. Benito Mccreedy         After Hours (After 5p/  First Contact Pager: [phone]  weekends / holidays): Second Contact Pager: [phone]   SUBJECTIVE   Chief Complaint: sleepiness  History of Present Illness:  [name] is a 63 year old with PMH of ESRD on HD via right [facility], atrial fibrillation, history of heart failure s/p heart transplant 2014 on immunosuppression, history of stroke, diabetes, and dementia who presents with sleepiness and worsening weakness.  [name] seen at bedside and not able to provide much additional history outside of he has been hiccuping [name] last few days and felt weak.  He was able to tell me his name where he was in [name] year.  He asked that I call his wife for additional information.  I called and talked with [name], who is at home taking care of their 33-year-old grandchild.  She reports that after [name] got home from dialysis on Friday he was very sleepy and had a lot of trouble transferring out of wheelchair into [name] house.  She also noticed that he was hiccuping a lot but [name] symptoms improved over [name] weekend.   Normally, he has difficulty standing and uses a wheelchair for ambulation within [name] house. He needs help with most ADLs but has also needed assistance with eating over [name] weekend due to weakness.   He underwent right brachial artery to cephalic vein AV fistula creation on 3/25 with Dr. Pascal Lux.  Eliquis was paused for a few days prior to procedure and he restarted medication on Friday.  Following procedure she noted swelling in right upper extremity that actually is improved over [name] week.  [name] has not endorsed pain to right upper  extremity.  He was admitted to [facility] in January with gangrene of his left foot.  They are following with [facility].  They have considered transitioning to hospice, however [name] does not want to stop dialysis at this time.  [name] says that her and her daughter have been reviewing MOST forms and I confirmed that [name] is full code at this time.  ED Course: Hypotensive to 85/59 with MAP of 68.  Flu +ve  Given once dose of ceftriaxone, 500 cc NS  Past Medical History CVA Heart failure s/p heart transplant ESRD on HD Dementia  Past Surgical History:  Procedure Laterality Date   A/V FISTULAGRAM Right 05/23/2023   Procedure: A/V Fistulagram;  Surgeon: Victorino Sparrow, MD;  Location[facility] INVASIVE CV LAB;  Service: Cardiovascular;  Laterality: Right;   AV FISTULA PLACEMENT Right 07/04/2023   Procedure: ARTERIOVENOUS (AV) FISTULA  REVISION WITH APPLICATION OF ARTEGRAFT RIGHT ARM;  Surgeon: Maeola Harman, MD;  Location: [facility] OR;  Service: Vascular;  Laterality: Right;   AV FISTULA PLACEMENT Right 11/19/2023   Procedure: RIGHT BRACHIOCEPHALIC ARTERIOVENOUS (AV) FISTULA CREATION;  Surgeon: Maeola Harman, MD;  Location: [facility] OR;  Service: Vascular;  Laterality: Right;   CARDIAC PACEMAKER PLACEMENT  09/21/2009   Biventricular implantable cardioverter-defibrillator implantation  COLONOSCOPY W/ BIOPSIES AND POLYPECTOMY     CYSTOSCOPY WITH FULGERATION N/A 04/27/2021   Procedure: CYSTOSCOPY AND CLOT EVACUATION WITH BLADDER BIOPSY/ FUGARATION OF BLADDER/ FULGARATION OF PROSTATE ;  Surgeon: Jerilee Field, MD;  Location: [facility] ORS;  Service: Urology;  Laterality: N/A;   FOOT SURGERY     left   HEART TRANSPLANT  2014   INSERTION OF DIALYSIS CATHETER Right 07/04/2023   Procedure: INSERTION OF DIALYSIS CATHETER WITH 19 CM PALINDROME;  Surgeon: Maeola Harman, MD;  Location: [facility] OR;  Service: Vascular;  Laterality: Right;   LAPAROSCOPIC GASTRIC BANDING  01/27/2007    LEFT VENTRICULAR ASSIST DEVICE     implanted at [facility]   PACEMAKER REMOVAL     PROSTATE BIOPSY     TOTAL HIP ARTHROPLASTY Right 07/22/2017   Procedure: RIGHT TOTAL HIP ARTHROPLASTY ANTERIOR APPROACH;  Surgeon: Samson Frederic, MD;  Location: [facility] OR;  Service: Orthopedics;  Laterality: Right;  Needs RNFA   TOTAL KNEE ARTHROPLASTY Right 07/22/2017    Meds:  Aripiprazole 10 mg Atorvastatin 40 mg Cinacalcet 120 mg Cyclosoprine 125 mg BID DepaKote 250 mg am and 500 mg at bedtime Duloxetine 60 mg Eliquis 2.5 mg bid Midodrine 10 mg TID on hd BID on non HD Predisolone 5 mg Lyrica 75 mg Sevelamer 1600 TID Trazodone 100 mg  Social:  Lives With: Support:  Level of Function: he uses walker, wife helps with dressing, toileting and bathing. Sometimes needs help with eating PCP: Substances: no tobaacoo  Family History: N/A  Allergies: Allergies as of 11/24/2023 - Review Complete 11/24/2023  Allergen Reaction Noted   Heparin Nausea Only, Swelling, and Other (See Comments) 05/16/2012   Iodinated contrast media  07/04/2022   Penicillin g potassium [penicillin g] Nausea Only and Other (See Comments) 12/13/2015    Review of Systems: A complete ROS was negative except as per HPI.   OBJECTIVE:   Physical Exam: Blood pressure (!) 82/62, pulse 82, temperature 99.3 F (37.4 C), temperature source Rectal, resp. rate 15, SpO2 94%.  Constitutional: Chronically ill appearing Cardiovascular: regular rate and rhythm, no m/r/g, right internal jugular TDC with dressing over, no erythema or skin changes noted Pulmonary/Chest: normal work of breathing on room air, lungs clear to auscultation bilaterally Abdominal: soft, non-tender, non-distended MSK:  RUE - Inspection: non pitting edema from to shoulder on right, incision at antecubital space well approximated without erythema or warmth, minor ecchymosis - Palpation: radial pulse difficult to palpate, found with doppler, no pain to  palpation RLE - Inspection: 2+ pitting edema to knee - Palpation: no pain to palpation LLE - ulceration present to 1st, 2nd and 3rd toes Neurological: alert & oriented x 3, normal gait   Labs: CBC    Component Value Date/Time   WBC 6.8 11/24/2023 1635   RBC 3.56 (L) 11/24/2023 1635   HGB 11.2 (L) 11/24/2023 1635   HCT 35.5 (L) 11/24/2023 1635   PLT 133 (L) 11/24/2023 1635   MCV 99.7 11/24/2023 1635   MCH 31.5 11/24/2023 1635   MCHC 31.5 11/24/2023 1635   RDW 17.2 (H) 11/24/2023 1635   LYMPHSABS 0.7 11/24/2023 1635   MONOABS 1.0 11/24/2023 1635   EOSABS 0.1 11/24/2023 1635   BASOSABS 0.1 11/24/2023 1635     CMP     Component Value Date/Time   NA 136 11/24/2023 1635   K 5.0 11/24/2023 1635   CL 95 (L) 11/24/2023 1635   CO2 26 11/24/2023 1635   GLUCOSE 97 11/24/2023 1635   BUN 49 (  H) 11/24/2023 1635   CREATININE 9.01 (H) 11/24/2023 1635   CALCIUM 8.2 (L) 11/24/2023 1635   PROT 6.0 (L) 11/24/2023 1635   ALBUMIN 2.8 (L) 11/24/2023 1635   AST 15 11/24/2023 1635   ALT 8 11/24/2023 1635   ALKPHOS 104 11/24/2023 1635   BILITOT 1.2 11/24/2023 1635   GFRNONAA 6 (L) 11/24/2023 1635   GFRAA 8 (L) 11/11/2018 0432    Imaging: CT chest  IMPRESSION: Left lower lobe airspace opacity again noted, less pronounced than prior study. This could reflect atelectasis or pneumonia.   Cardiomegaly, diffuse coronary artery disease, status post CABG.   Markedly dilated pulmonary arteries compatible with pulmonary arterial hypertension. This is stable since prior study.   Aortic Atherosclerosis (ICD10-I70.0).  CT head IMPRESSION: Old right frontal infarct.   Atrophy, chronic microvascular disease.   No acute intracranial abnormality.  EKG: sinus rhythm, RBBB, LAFB unchanged from prior  ASSESSMENT & PLAN:   Assessment & Plan by Problem: Principal Problem:   Acute metabolic encephalopathy   BRADLY SANGIOVANNI is a 63 y.o. person living with ESRD on HD via right [facility], atrial  fibrillation, history of heart failure s/p heart transplant 2014 on immunosuppression, history of stroke, diabetes, and dementia who presents with sleepiness and worsening weakness and admitted for acute metabolic encephalopathy on hospital day 0  Acute metabolic encephalopathy Influenza A [name]'s wife reports that since Friday he has been more sleepy and out of it.  He has needed assistance with feeding which is new for him.  He does have history of dementia.  His wife reports that he generally knows what is going on but gets agitated at night and she has noted some behavioral changes.  He takes aripiprazole, Depakote, and trazodone for this reason.  He is also had worsening weakness for [name] last few days and he is not able to transfer on his own.  He typically uses a walker and is will to ambulate for short distances.  From a metabolic standpoint his BUN is only mildly elevated and he is due for dialysis tomorrow.  He does have history of OSA although reports resolved after cardiac transplant no longer uses CPAP.  VBG is pending to evaluate for hypercarbia.  He is on several medications that could contribute to metabolic encephalopathy.  Will hold trazodone but continue Abilify and Depakote.  From infectious standpoint he has influenza A.  He is immunosuppressed and may have harder time mounting leukocytosis with infection.  CT chest notes possible atelectasis versus left consolidation.  He is not hypoxic or febrile.  I am less concerned for a superimposed bacterial infection at this time and will hold off on additional antibiotics.  CT head showed old infarcts but no acute bleeds.  He does not have focal neurologic deficits so we will hold off on MRI at this time.  His wife shares concerns that this is more of an acute on chronic weakness for him.  It sounds like he has been plugged in with home health physical therapy but continued to lose strength and mobility since his hospital admission in  January.   -tamiflu 30 mg once today, then 30 mg after HD for over 5 days -PT/OT  Tremors Tremor to bilateral upper extremities noted. On chart review, neurology does note tremors on outpatient note 11/24. - spot EEG  RUE swelling History of fistula converted to a graft in right forearm which thrombosed.  He is currently reliant on a tunneled dialysis catheter in right jugular.  He had right brachial artery to cephalic vein fistula creation on 3/25. Significant swelling noted to RUE today.  Last office note with vascular surgery 3/12 notes 2+ radial pulses on right side.  I was not able to palpate a radial pulse.  His fingers are warm and feel well-perfused.  He is not having any pain in his right upper extremity.  I remain somewhat concerned for arterial or venous clot with recent procedure, decreased mobility and holding blood thinner for procedure.  - RUE arterial and venous US - Continue eliquis 2.5 mg bid  RLE swelling 2+ pitting edema to knee. His wife reports this happens at times for him since transplant.  I was not able to find prior imaging to indicate pelvic congestion contributing to edema. -RLE [place]  ESRD on HD MWF Following cardiac transplant he had renal failure that unfortunately did not recover.  He has been on dialysis for about 10 years.  Last HD session was on Friday which she tolerated.  He will be due for dialysis 3/31.  No acute indications will consult nephrology tomorrow a.m. -Consult nephrology 3/31 in a.m. -RFP  Ulceration of left toes History of dry gangrene He was admitted in January with primary gangrene.  MRI showed no evidence of osteomyelitis.  Scaler surgery recommended left TMA given that microvascular disease most likely compromising blood flow in setting of ESRD.  Last office visit with podiatry October 02, 2023. On exam, he has several areas needing debridement. I am less concerned for acute infection. He does need to follow-up with podiatry  soon.  Dementia Follows with neurology at [facility]. Unclear if FTD, vascular, or Alzheimers. Medications include Abilify, trazodone, duloxetine, and Depakote.  These medications could be contributing to worsening confusion, although I suspect it is most likely from flu infection.  Will hold trazodone this evening.  He already received other 3 medications today.  If mentation does not improve could consider decreasing sedating medications. -continue abilify, duloxetine, and depakote -holding trazodone -delirium precautions  History of cardiac transplant, 2014 Follows with [facility], last visit 10/24/23. Myfortic was discontinued and he was started on prednisone. Echo 10/25/22 - EF > 55, LVDD 4.2.  -Continue cyclosporine 125 mg twice daily -Continue prednisolone 5 mg daily  Chronic normocytic anemia Thrombocytopenia Baseline Hgb around 10-11. At 11 today. Platelets at 133 which is new. Will repeat CBC in AM. If worsening would get peripheral smear. -trend cbc  Diabetes History of peripheral neuropathy Diabetes is controlled by diet.  Home medications include Lyrica 75 mg qd. -Continue Lyrica 75 mg daily -SSI, very sensitive  Diet: Normal VTE: Enoxaparin IVF: None,None Code: Full  Prior to Admission Living Arrangement: Home, living wife Anticipated Discharge Location: pending PT/OT Barriers to Discharge: weakness, evaluation of unilateral swelling  Dispo: Admit [name] to Observation with expected length of stay less than 2 midnights.  Signed: Rudene Christians, DO Internal Medicine Resident PGY-3  11/24/2023, 10:37 PM

## 2023-11-24 NOTE — ED Notes (Signed)
Orange juice given to Pt.

## 2023-11-24 NOTE — ED Notes (Signed)
 Pt hypotensive. MD aware. Pt receiving ivf at this time

## 2023-11-25 ENCOUNTER — Encounter (HOSPITAL_COMMUNITY): Payer: Self-pay | Admitting: Infectious Diseases

## 2023-11-25 ENCOUNTER — Observation Stay (HOSPITAL_BASED_OUTPATIENT_CLINIC_OR_DEPARTMENT_OTHER)

## 2023-11-25 ENCOUNTER — Observation Stay (HOSPITAL_COMMUNITY)
Admit: 2023-11-25 | Discharge: 2023-11-25 | Disposition: A | Discharge status: Home / Self Care | Attending: Infectious Diseases | Admitting: Infectious Diseases

## 2023-11-25 DIAGNOSIS — R609 Edema, unspecified: Secondary | ICD-10-CM | POA: Diagnosis not present

## 2023-11-25 DIAGNOSIS — R569 Unspecified convulsions: Secondary | ICD-10-CM | POA: Diagnosis not present

## 2023-11-25 DIAGNOSIS — G9341 Metabolic encephalopathy: Secondary | ICD-10-CM

## 2023-11-25 DIAGNOSIS — N186 End stage renal disease: Secondary | ICD-10-CM | POA: Diagnosis not present

## 2023-11-25 DIAGNOSIS — R4182 Altered mental status, unspecified: Secondary | ICD-10-CM | POA: Diagnosis not present

## 2023-11-25 LAB — DIFFERENTIAL
Abs Immature Granulocytes: 0.15 10*3/uL — ABNORMAL HIGH (ref 0.00–0.07)
Basophils Absolute: 0.1 10*3/uL (ref 0.0–0.1)
Basophils Relative: 1 %
Eosinophils Absolute: 0.2 10*3/uL (ref 0.0–0.5)
Eosinophils Relative: 3 %
Immature Granulocytes: 2 %
Lymphocytes Relative: 12 %
Lymphs Abs: 0.8 10*3/uL (ref 0.7–4.0)
Monocytes Absolute: 1.2 10*3/uL — ABNORMAL HIGH (ref 0.1–1.0)
Monocytes Relative: 18 %
Neutro Abs: 4.3 10*3/uL (ref 1.7–7.7)
Neutrophils Relative %: 64 %

## 2023-11-25 LAB — CBC
HCT: 30.6 % — ABNORMAL LOW (ref 39.0–52.0)
HCT: 31 % — ABNORMAL LOW (ref 39.0–52.0)
Hemoglobin: 9.9 g/dL — ABNORMAL LOW (ref 13.0–17.0)
Hemoglobin: 10 g/dL — ABNORMAL LOW (ref 13.0–17.0)
MCH: 31.7 pg (ref 26.0–34.0)
MCH: 31.6 pg (ref 26.0–34.0)
MCHC: 32.4 g/dL (ref 30.0–36.0)
MCHC: 32.3 g/dL (ref 30.0–36.0)
MCV: 98.1 fL (ref 80.0–100.0)
MCV: 98.1 fL (ref 80.0–100.0)
Platelets: 133 10*3/uL — ABNORMAL LOW (ref 150–400)
Platelets: 137 10*3/uL — ABNORMAL LOW (ref 150–400)
RBC: 3.12 MIL/uL — ABNORMAL LOW (ref 4.22–5.81)
RBC: 3.16 MIL/uL — ABNORMAL LOW (ref 4.22–5.81)
RDW: 17.2 % — ABNORMAL HIGH (ref 11.5–15.5)
RDW: 17.2 % — ABNORMAL HIGH (ref 11.5–15.5)
WBC: 6.7 10*3/uL (ref 4.0–10.5)
WBC: 6.8 10*3/uL (ref 4.0–10.5)
nRBC: 0 % (ref 0.0–0.2)
nRBC: 0 % (ref 0.0–0.2)

## 2023-11-25 LAB — TECHNOLOGIST SMEAR REVIEW: Plt Morphology: NORMAL

## 2023-11-25 LAB — GLUCOSE, CAPILLARY
Glucose-Capillary: 71 mg/dL (ref 70–99)
Glucose-Capillary: 71 mg/dL (ref 70–99)
Glucose-Capillary: 81 mg/dL (ref 70–99)
Glucose-Capillary: 63 mg/dL — ABNORMAL LOW (ref 70–99)
Glucose-Capillary: 88 mg/dL (ref 70–99)

## 2023-11-25 LAB — HEPATITIS B SURFACE ANTIGEN: Hepatitis B Surface Ag: NONREACTIVE

## 2023-11-25 LAB — MRSA NEXT GEN BY PCR, NASAL: MRSA by PCR Next Gen: NOT DETECTED

## 2023-11-25 MED ORDER — DOXERCALCIFEROL 4 MCG/2ML IV SOLN
4.0000 ug | INTRAVENOUS | Status: DC
  Administered 2023-11-27: 4 ug via INTRAVENOUS
  Filled 2023-11-25: qty 2

## 2023-11-25 MED ORDER — PANTOPRAZOLE SODIUM 40 MG PO TBEC
40.0000 mg | DELAYED_RELEASE_TABLET | Freq: Every day | ORAL | Status: DC
  Administered 2023-11-26: 40 mg via ORAL
  Filled 2023-11-25: qty 1

## 2023-11-25 MED ORDER — LIDOCAINE-PRILOCAINE 2.5-2.5 % EX CREA: 1.0000 | TOPICAL_CREAM | CUTANEOUS | Status: DC | PRN

## 2023-11-25 MED ORDER — SODIUM CHLORIDE 0.9% FLUSH: 10.0000 mL | INTRAVENOUS | Status: DC | PRN

## 2023-11-25 MED ORDER — ALTEPLASE 2 MG IJ SOLR: 2.0000 mg | Freq: Once | INTRAMUSCULAR | Status: DC | PRN

## 2023-11-25 MED ORDER — CHLORHEXIDINE GLUCONATE CLOTH 2 % EX PADS
6.0000 | MEDICATED_PAD | Freq: Every day | CUTANEOUS | Status: DC
  Administered 2023-11-26: 6 via TOPICAL

## 2023-11-25 MED ORDER — NEPRO/CARBSTEADY PO LIQD: 237.0000 mL | ORAL | Status: DC | PRN

## 2023-11-25 MED ORDER — SODIUM CHLORIDE 0.9% FLUSH
10.0000 mL | Freq: Two times a day (BID) | INTRAVENOUS | Status: DC
  Administered 2023-11-25 – 2023-11-26 (×2): 10 mL

## 2023-11-25 MED ORDER — PENTAFLUOROPROP-TETRAFLUOROETH EX AERO: 1.0000 | INHALATION_SPRAY | CUTANEOUS | Status: DC | PRN

## 2023-11-25 MED ORDER — CHLORHEXIDINE GLUCONATE CLOTH 2 % EX PADS
6.0000 | MEDICATED_PAD | Freq: Every day | CUTANEOUS | Status: DC
  Administered 2023-11-27: 6 via TOPICAL

## 2023-11-25 MED ORDER — LIDOCAINE HCL (PF) 1 % IJ SOLN: 5.0000 mL | INTRAMUSCULAR | Status: DC | PRN

## 2023-11-25 MED ORDER — ANTICOAGULANT SODIUM CITRATE 4% (200MG/5ML) IV SOLN
5.0000 mL | Status: DC | PRN
  Administered 2023-11-25: 3.2 mL
  Filled 2023-11-25: qty 3.2

## 2023-11-25 NOTE — Progress Notes (Addendum)
 Received patient in bed to unit.  Alert and oriented.  Informed consent signed and in chart.   TX duration:3.5 hours  Patient tolerated well.  Transported back to the room  Alert, without acute distress.  Hand-off given to patient's nurse.   Access used: R internal jugular HD Cath Access issues: none  Total UF removed: 2.5L Medication(s) given: Midodrine, Hectoral, Sodium Citrate dwells used due to Heparin allergy   11/25/23 1859  Vitals  Temp 98 F (36.7 C)  BP 107/87  Pulse Rate 78  Resp 15  Oxygen Therapy  SpO2 100 %  O2 Device Nasal Cannula  O2 Flow Rate (L/min) 2 L/min  Patient Activity (if Appropriate) In bed  Pulse Oximetry Type Continuous  During Treatment Monitoring  Duration of HD Treatment -hour(s) 3.5 hour(s)  HD Safety Checks Performed Yes  Intra-Hemodialysis Comments Tx completed  Dialysis Fluid Bolus Normal Saline  Bolus Amount (mL) 300 mL  Post Treatment  Dialyzer Clearance Clear  Liters Processed 84  Fluid Removed (mL) 2500 mL  Tolerated HD Treatment Yes  Hemodialysis Catheter Right Internal jugular Double lumen Temporary (Non-Tunneled)  Placement Date/Time: 07/04/23 1017   Placed prior to admission: No  Serial / Lot #: [id number]  Expiration Date: 03/25/28  Time Out: Correct patient;Correct procedure;Correct site  Maximum sterile barrier precautions: Sterile probe cover;Sterile glove...  Site Condition No complications  Blue Lumen Status Flushed;Heparin locked;Dead end cap in place  Red Lumen Status Flushed;Heparin locked;Dead end cap in place  Purple Lumen Status N/A  Catheter fill solution 4% Sodium Citrate (Heparin allergy)  Catheter fill volume (Arterial) 1.6 cc  Catheter fill volume (Venous) 1.6  Dressing Type Transparent  Dressing Status Antimicrobial disc/dressing in place;Clean, Dry, Intact  Interventions  (deaccessed)  Drainage Description None  Dressing Change Due 12/02/23  Post treatment catheter status Capped and Clamped      Stacie Glaze LPN [facility]

## 2023-11-25 NOTE — Consult Note (Signed)
 Renal Service Consult Note [facility]  [name] 11/25/2023 Maree Krabbe, MD Requesting Physician: Dr. Mikey Bussing  Reason for Consult: ESRD pt w/ altered mental status HPI: The patient is a 63 y.o. year-old w/ PMH of esrd on HD, atrial fib, hx heart failure sp heart transplant 2014, hx CVA, DM and dementia who presented w/ worsening somnolence and gen'd weakness. Pt had recent AVF creation in his R arm on 3/25 by Dr Randie Heinz w/ [facility]. Was also here in Jan 2025 w/ L foot gangrene. He is f/b [facility] hospice, they have considered hospice transition but patient at this time does not want to stop dialysis at this time.  In ED bp 85/59, flu A+, given IV abx and IV NS 500 cc.  Pt was admitted. We are asked to see for dialysis.      Pt seen in room. Had trouble getting information from the patient. Hx as above.    PMH Anxiety Atrial fib ESRD on HD H/o CVA Dementia DM2 H/o heart transplant HL HTN OSA   ROS - n/a   Past Medical History  Past Medical History:  Diagnosis Date   Anxiety    Arthritis    Atrial fibrillation (HCC)    CHF (congestive heart failure), NYHA class III (HCC)    s/p heart transplant   Chronic kidney disease    end stage   Chronic systolic dysfunction of left ventricle    CVA (cerebral infarction) 2010   Dementia (HCC)    Depression    Diabetes mellitus    PMH; Prior to heart transplant   Family history of coronary artery disease    in both parents   GERD (gastroesophageal reflux disease)    Hyperlipidemia    Hypertension    Left bundle branch block    Morbid obesity (HCC)    status post lap band   Myocardial infarction [facility])    prior to heart transplant   Nonischemic cardiomyopathy (HCC)    prior to heart transplant   Obesity (BMI 30-39.9)    Obstructive sleep apnea    no longer needs CPAP after heart transplant per pt   Premature ventricular contractions    Prostate cancer (HCC)    SOB (shortness of breath)    Past  Surgical History  Past Surgical History:  Procedure Laterality Date   A/V FISTULAGRAM Right 05/23/2023   Procedure: A/V Fistulagram;  Surgeon: Victorino Sparrow, MD;  Location: [facility] INVASIVE CV LAB;  Service: Cardiovascular;  Laterality: Right;   AV FISTULA PLACEMENT Right 07/04/2023   Procedure: ARTERIOVENOUS (AV) FISTULA  REVISION WITH APPLICATION OF ARTEGRAFT RIGHT ARM;  Surgeon: Maeola Harman, MD;  Location: [facility] OR;  Service: Vascular;  Laterality: Right;   AV FISTULA PLACEMENT Right 11/19/2023   Procedure: RIGHT BRACHIOCEPHALIC ARTERIOVENOUS (AV) FISTULA CREATION;  Surgeon: Maeola Harman, MD;  Location: [facility] OR;  Service: Vascular;  Laterality: Right;   CARDIAC PACEMAKER PLACEMENT  09/21/2009   Biventricular implantable cardioverter-defibrillator implantation      COLONOSCOPY W/ BIOPSIES AND POLYPECTOMY     CYSTOSCOPY WITH FULGERATION N/A 04/27/2021   Procedure: CYSTOSCOPY AND CLOT EVACUATION WITH BLADDER BIOPSY/ FUGARATION OF BLADDER/ FULGARATION OF PROSTATE ;  Surgeon: Jerilee Field, MD;  Location: [facility] ORS;  Service: Urology;  Laterality: N/A;   FOOT SURGERY     left   HEART TRANSPLANT  2014   INSERTION OF DIALYSIS CATHETER Right 07/04/2023   Procedure: INSERTION OF DIALYSIS CATHETER WITH 19 CM PALINDROME;  Surgeon:  Maeola Harman, MD;  Location: [facility] OR;  Service: Vascular;  Laterality: Right;   LAPAROSCOPIC GASTRIC BANDING  01/27/2007   LEFT VENTRICULAR ASSIST DEVICE     implanted at [facility]   PACEMAKER REMOVAL     PROSTATE BIOPSY     TOTAL HIP ARTHROPLASTY Right 07/22/2017   Procedure: RIGHT TOTAL HIP ARTHROPLASTY ANTERIOR APPROACH;  Surgeon: Samson Frederic, MD;  Location: [facility] OR;  Service: Orthopedics;  Laterality: Right;  Needs RNFA   TOTAL KNEE ARTHROPLASTY Right 07/22/2017   Family History  Family History  Problem Relation Age of Onset   Coronary artery disease Father        had, PTCA & CABG   Heart attack Father    Hypertension Father    Diabetes  Father    Prostate cancer Father    Coronary artery disease Mother    Heart attack Mother    Hypertension Mother    Stroke Mother    Lupus Mother    Prostate cancer Brother    Coronary artery disease Brother    Coronary artery disease Sister    Heart attack Sister    Prostate cancer Paternal Uncle    Coronary artery disease Maternal Grandmother    Coronary artery disease Maternal Grandfather    Coronary artery disease Paternal Grandmother    Coronary artery disease Paternal Grandfather    Social History  reports that he has never smoked. He has quit using smokeless tobacco.  His smokeless tobacco use included snuff. He reports that he does not drink alcohol and does not use drugs. Allergies  Allergies  Allergen Reactions   Heparin Nausea Only, Swelling and Other (See Comments)    * * HIT * *  SWELLING REACTION UNSPECIFIED   DIAPHORESIS    Iodinated Contrast Media     Unknown reaction   Penicillin G Potassium [Penicillin G] Nausea Only and Other (See Comments)    DIAPHORESIS High Doses   Home medications Prior to Admission medications   Medication Sig Start Date End Date Taking? Authorizing Provider  acetaminophen (TYLENOL) 500 MG tablet Take 1,000 mg by mouth 2 (two) times daily as needed for moderate pain (pain score 4-6).   Yes [provider]  ARIPiprazole (ABILIFY) 10 MG tablet Take 10 mg by mouth every morning.   Yes [provider]  atorvastatin (LIPITOR) 40 MG tablet Take 1 tablet (40 mg total) by mouth daily. 09/16/23  Yes Carmina Miller, DO  cycloSPORINE modified (NEORAL) 100 MG capsule Take 100 mg by mouth See admin instructions. Take with 25 mg for a total of 125 mg twice daily   Yes [provider]  cycloSPORINE modified 25 MG CAPS Take 25 mg by mouth See admin instructions. Take with 100 mg for a total of 125 mg twice daily 05/09/23  Yes [provider]  divalproex (DEPAKOTE) 125 MG DR tablet Take 250 mg by mouth in morning and  500 mg by mouth at night Patient taking differently: Take 250-500 mg by mouth See admin instructions. Take 250 mg by mouth in morning and 500 mg by mouth at night 12/17/22  Yes Levert Feinstein, MD  DULoxetine (CYMBALTA) 60 MG capsule TAKE 1 CAPSULE(60 MG) BY MOUTH DAILY 10/23/23  Yes Levert Feinstein, MD  ELIQUIS 2.5 MG TABS tablet Take 1 tablet (2.5 mg total) by mouth 2 (two) times daily. 04/30/21  Yes Jerilee Field, MD  gentamicin cream (GARAMYCIN) 0.1 % Apply 1 Application topically 2 (two) times daily. 10/02/23  Yes Gala Lewandowsky  M, DPM  hydrocortisone (ANUSOL-HC) 2.5 % rectal cream Apply 1 Application topically as needed for hemorrhoids or anal itching. 11/01/22  Yes [provider]  ibuprofen (ADVIL) 200 MG tablet Take 400 mg by mouth as needed for moderate pain (pain score 4-6) or mild pain (pain score 1-3).   Yes [provider]  midodrine (PROAMATINE) 10 MG tablet Take 1 tablet (10 mg total) by mouth 3 (three) times daily with meals. 09/16/23  Yes Carmina Miller, DO  naloxone [facility]) nasal spray 4 mg/0.1 mL Place 0.4 mg into the nose once as needed (opioid overdose).   Yes [provider]  oxyCODONE-acetaminophen (PERCOCET) 5-325 MG tablet Take 1 tablet by mouth every 6 (six) hours as needed (pain). Do not take with other opiate pain medication 11/19/23 11/18/24 Yes Rhyne, Ames Coupe, PA-C  prednisoLONE 5 MG TABS tablet Take 5 mg by mouth daily.   Yes [provider]  pregabalin (LYRICA) 75 MG capsule Take 1 capsule (75 mg total) by mouth daily. 09/16/23  Yes Carmina Miller, DO  traZODone (DESYREL) 100 MG tablet Take 100 mg by mouth at bedtime. 08/18/22  Yes [provider]  cinacalcet (SENSIPAR) 30 MG tablet Take 4 tablets (120 mg total) by mouth daily with supper. Patient not taking: Reported on 11/14/2023 09/16/23   Carmina Miller, DO  sevelamer carbonate (RENVELA) 800 MG tablet Take 2 tablets (1,600 mg total) by mouth 3 (three) times daily with meals. Patient not  taking: Reported on 11/24/2023 11/11/18   Darlin Drop, DO     Vitals:   11/25/23 1610 11/25/23 1002 11/25/23 1005 11/25/23 1144  BP: (!) 55/19 (!) 125/99 95/63 108/71  Pulse: (!) 211  (!) 140 69  Resp:      Temp:      TempSrc:      SpO2:   92%   Height:       Exam Gen awake, drowsy, vague historian, follows simple commands No rash, cyanosis or gangrene Sclera anicteric, throat clear  No jvd or bruits Chest clear bilat to bases, no rales/ wheezing RRR no MRG Abd soft ntnd no mass or ascites +bs GU nl male MS no joint effusions or deformity Ext LE or UE edema, no other edema Neuro is alert, Ox 3 , nf          Renal-related home meds: Sensipar 120mg  at bedtime Midodrine 10 tid Renvela 1600 mg ac tid Others: lyrica 75, prednisolone, eliquis, cymbalta, depakote, neoral (cyclosporine), statin, abilify     OP HD: [facility] MWF  4h  B500   114kg   2K bath   RIJ TDC   Heparin none - hectorol 4 mcg iv three times per week - last HD was 3/28, post wt 117.3kg - comes off 0-2 kg over dry wt   CXR 3/30 - RIJ TDC, no infiltrates   Assessment/ Plan: Acute metabolic encephalopathy - due to flu A infection, poss bact PNA, underlying dementia.  RUE swelling - sp new AVF creation on 3/25 by Dr Randie Heinz. Per pmd.  ESRD: on HD MWF.  HD today.  BP: has chronic hypotension on midodrine 10 tid, cont Volume: no sig vol excess on exam. Up by wts. UF goal 2L as tolerated  Anemia of esrd: Hb 10-12 here, follow.  Secondary hyperparathyroidism: CCa in range, phos a bit high. Cont binders ac.  Dementia DM2 H/o CVA H/o heart transplant      Vinson Moselle  MD [facility] 11/25/2023, 1:52 PM  Recent Labs  Lab 11/24/23 1635 11/24/23 2235 11/25/23 0323  HGB 11.2*  --  10.0*  9.9*  ALBUMIN 2.8* 2.6*  --   CALCIUM 8.2* 7.9*  --   PHOS  --  7.1*  --   CREATININE 9.01* 9.00*  --   K 5.0 4.7  --    Inpatient medications:  apixaban  2.5 mg Oral BID   ARIPiprazole  10 mg Oral q morning    atorvastatin  40 mg Oral Daily   Chlorhexidine Gluconate Cloth  6 each Topical Daily   cycloSPORINE modified  100 mg Oral BID   And   cycloSPORINE modified  25 mg Oral BID   divalproex  250 mg Oral q AM   divalproex  500 mg Oral QHS   DULoxetine  60 mg Oral Daily   insulin aspart  0-6 Units Subcutaneous TID WC   midodrine  10 mg Oral TID WC   oseltamivir  30 mg Oral Q M,W,F-HD   pantoprazole  40 mg Oral Daily   prednisoLONE  5 mg Oral Daily   pregabalin  75 mg Oral Daily   sevelamer carbonate  1,600 mg Oral TID WC   sodium chloride flush  10-40 mL Intracatheter Q12H    sodium chloride flush

## 2023-11-25 NOTE — Progress Notes (Signed)
 RUE venous and arterial duplex has been completed.  RLE venous duplex has been completed.   Results can be found under chart review under CV PROC. 11/25/2023 1:18 PM Tabbitha Janvrin RVT, RDMS

## 2023-11-25 NOTE — Consult Note (Addendum)
 Hospital Consult    Reason for Consult:  R arm swelling Requesting Physician:  Internal Medicine MRN #:  [id number]  History of Present Illness: This is a 63 y.o. male with complex past medical history being seen in consultation for evaluation of right arm swelling.  He was also found to have an incidental radial artery occlusion on duplex.  He is status post right brachiocephalic fistula creation by Dr. Randie Heinz on 11/19/2023.  He is somnolent on exam, thus much of the history comes from chart review.  He seems to have developed right upper extremity edema since fistula creation surgery.  He has a right sided TDC.  He denies any pain, numbness, or weakness in his right upper extremity.  Venous duplex was negative for DVT.  Arterial duplex demonstrates an occlusion of the distal right radial artery.  He does not have any wounds on the hand or fingertips.  He is being treated for encephalopathy and influenza.  Past Medical History:  Diagnosis Date   Anxiety    Arthritis    Atrial fibrillation (HCC)    CHF (congestive heart failure), NYHA class III (HCC)    s/p heart transplant   Chronic systolic dysfunction of left ventricle    CVA (cerebral infarction) 2010   Dementia (HCC)    Depression    Diabetes mellitus    PMH; Prior to heart transplant   ESRD on hemodialysis (HCC)    Family history of coronary artery disease    in both parents   GERD (gastroesophageal reflux disease)    Hyperlipidemia    Hypertension    Left bundle branch block    Morbid obesity (HCC)    status post lap band   Myocardial infarction [facility])    prior to heart transplant   Nonischemic cardiomyopathy (HCC)    prior to heart transplant   Obesity (BMI 30-39.9)    Obstructive sleep apnea    no longer needs CPAP after heart transplant per pt   Premature ventricular contractions    Prostate cancer (HCC)    SOB (shortness of breath)     Past Surgical History:  Procedure Laterality Date   A/V FISTULAGRAM Right  05/23/2023   Procedure: A/V Fistulagram;  Surgeon: Victorino Sparrow, MD;  Location: [facility] INVASIVE CV LAB;  Service: Cardiovascular;  Laterality: Right;   AV FISTULA PLACEMENT Right 07/04/2023   Procedure: ARTERIOVENOUS (AV) FISTULA  REVISION WITH APPLICATION OF ARTEGRAFT RIGHT ARM;  Surgeon: Maeola Harman, MD;  Location: [facility] OR;  Service: Vascular;  Laterality: Right;   AV FISTULA PLACEMENT Right 11/19/2023   Procedure: RIGHT BRACHIOCEPHALIC ARTERIOVENOUS (AV) FISTULA CREATION;  Surgeon: Maeola Harman, MD;  Location: [facility] OR;  Service: Vascular;  Laterality: Right;   CARDIAC PACEMAKER PLACEMENT  09/21/2009   Biventricular implantable cardioverter-defibrillator implantation      COLONOSCOPY W/ BIOPSIES AND POLYPECTOMY     CYSTOSCOPY WITH FULGERATION N/A 04/27/2021   Procedure: CYSTOSCOPY AND CLOT EVACUATION WITH BLADDER BIOPSY/ FUGARATION OF BLADDER/ FULGARATION OF PROSTATE ;  Surgeon: Jerilee Field, MD;  Location: [facility] ORS;  Service: Urology;  Laterality: N/A;   FOOT SURGERY     left   HEART TRANSPLANT  2014   INSERTION OF DIALYSIS CATHETER Right 07/04/2023   Procedure: INSERTION OF DIALYSIS CATHETER WITH 19 CM PALINDROME;  Surgeon: Maeola Harman, MD;  Location: [facility] OR;  Service: Vascular;  Laterality: Right;   LAPAROSCOPIC GASTRIC BANDING  01/27/2007   LEFT VENTRICULAR ASSIST DEVICE     implanted at  [facility]   PACEMAKER REMOVAL     PROSTATE BIOPSY     TOTAL HIP ARTHROPLASTY Right 07/22/2017   Procedure: RIGHT TOTAL HIP ARTHROPLASTY ANTERIOR APPROACH;  Surgeon: Samson Frederic, MD;  Location: [facility] OR;  Service: Orthopedics;  Laterality: Right;  Needs RNFA   TOTAL KNEE ARTHROPLASTY Right 07/22/2017    Allergies  Allergen Reactions   Heparin Nausea Only, Swelling and Other (See Comments)    * * HIT * *  SWELLING REACTION UNSPECIFIED   DIAPHORESIS    Iodinated Contrast Media     Unknown reaction   Penicillin G Potassium [Penicillin G] Nausea Only and Other (See  Comments)    DIAPHORESIS High Doses    Prior to Admission medications   Medication Sig Start Date End Date Taking? Authorizing Provider  acetaminophen (TYLENOL) 500 MG tablet Take 1,000 mg by mouth 2 (two) times daily as needed for moderate pain (pain score 4-6).   Yes [provider]  ARIPiprazole (ABILIFY) 10 MG tablet Take 10 mg by mouth every morning.   Yes [provider]  atorvastatin (LIPITOR) 40 MG tablet Take 1 tablet (40 mg total) by mouth daily. 09/16/23  Yes Carmina Miller, DO  cycloSPORINE modified (NEORAL) 100 MG capsule Take 100 mg by mouth See admin instructions. Take with 25 mg for a total of 125 mg twice daily   Yes [provider]  cycloSPORINE modified 25 MG CAPS Take 25 mg by mouth See admin instructions. Take with 100 mg for a total of 125 mg twice daily 05/09/23  Yes [provider]  divalproex (DEPAKOTE) 125 MG DR tablet Take 250 mg by mouth in morning and 500 mg by mouth at night Patient taking differently: Take 250-500 mg by mouth See admin instructions. Take 250 mg by mouth in morning and 500 mg by mouth at night 12/17/22  Yes Levert Feinstein, MD  DULoxetine (CYMBALTA) 60 MG capsule TAKE 1 CAPSULE(60 MG) BY MOUTH DAILY 10/23/23  Yes Levert Feinstein, MD  ELIQUIS 2.5 MG TABS tablet Take 1 tablet (2.5 mg total) by mouth 2 (two) times daily. 04/30/21  Yes Jerilee Field, MD  gentamicin cream (GARAMYCIN) 0.1 % Apply 1 Application topically 2 (two) times daily. 10/02/23  Yes Felecia Shelling, DPM  hydrocortisone (ANUSOL-HC) 2.5 % rectal cream Apply 1 Application topically as needed for hemorrhoids or anal itching. 11/01/22  Yes [provider]  ibuprofen (ADVIL) 200 MG tablet Take 400 mg by mouth as needed for moderate pain (pain score 4-6) or mild pain (pain score 1-3).   Yes [provider]  midodrine (PROAMATINE) 10 MG tablet Take 1 tablet (10 mg total) by mouth 3 (three) times daily with meals. 09/16/23  Yes Carmina Miller, DO  naloxone  [facility]) nasal spray 4 mg/0.1 mL Place 0.4 mg into the nose once as needed (opioid overdose).   Yes [provider]  oxyCODONE-acetaminophen (PERCOCET) 5-325 MG tablet Take 1 tablet by mouth every 6 (six) hours as needed (pain). Do not take with other opiate pain medication 11/19/23 11/18/24 Yes Rhyne, Ames Coupe, PA-C  prednisoLONE 5 MG TABS tablet Take 5 mg by mouth daily.   Yes [provider]  pregabalin (LYRICA) 75 MG capsule Take 1 capsule (75 mg total) by mouth daily. 09/16/23  Yes Carmina Miller, DO  traZODone (DESYREL) 100 MG tablet Take 100 mg by mouth at bedtime. 08/18/22  Yes [provider]  cinacalcet (SENSIPAR) 30 MG tablet Take 4 tablets (120 mg total) by mouth daily with  supper. Patient not taking: Reported on 11/14/2023 09/16/23   Carmina Miller, DO  sevelamer carbonate (RENVELA) 800 MG tablet Take 2 tablets (1,600 mg total) by mouth 3 (three) times daily with meals. Patient not taking: Reported on 11/24/2023 11/11/18   Darlin Drop, DO    Social History   Socioeconomic History   Marital status: Married    Spouse name: [name]   Number of children: 1   Years of education: 16   Highest education level: Not on file  Occupational History   Occupation: retired    Associate Professor: [employer]  Tobacco Use   Smoking status: Never   Smokeless tobacco: Former    Types: Snuff  Vaping Use   Vaping status: Never Used  Substance and Sexual Activity   Alcohol use: No   Drug use: No   Sexual activity: Not Currently  Other Topics Concern   Not on file  Social History Narrative   Lives with wife in a one story home.  Has one child.     Retired from the [employer].  Supervisor over transportation.    Education: college.    Social Drivers of Corporate investment banker Strain: Not on file  Food Insecurity: No Food Insecurity (11/25/2023)   Hunger Vital Sign    Worried About Running Out of Food in the Last Year: Never true    Ran Out of Food in  the Last Year: Never true  Transportation Needs: No Transportation Needs (11/25/2023)   PRAPARE - Administrator, Civil Service (Medical): No    Lack of Transportation (Non-Medical): No  Physical Activity: Not on file  Stress: Not on file  Social Connections: Not on file  Intimate Partner Violence: Not At Risk (11/25/2023)   Humiliation, Afraid, Rape, and Kick questionnaire    Fear of Current or Ex-Partner: No    Emotionally Abused: No    Physically Abused: No    Sexually Abused: No     Family History  Problem Relation Age of Onset   Coronary artery disease Father        had, PTCA & CABG   Heart attack Father    Hypertension Father    Diabetes Father    Prostate cancer Father    Coronary artery disease Mother    Heart attack Mother    Hypertension Mother    Stroke Mother    Lupus Mother    Prostate cancer Brother    Coronary artery disease Brother    Coronary artery disease Sister    Heart attack Sister    Prostate cancer Paternal Uncle    Coronary artery disease Maternal Grandmother    Coronary artery disease Maternal Grandfather    Coronary artery disease Paternal Grandmother    Coronary artery disease Paternal Grandfather     ROS: Otherwise negative unless mentioned in HPI  Physical Examination  Vitals:   11/25/23 1005 11/25/23 1144  BP: 95/63 108/71  Pulse: (!) 140 69  Resp:    Temp:    SpO2: 92%    Body mass index is 29.95 kg/m.  General:  WDWN in NAD Gait: Not observed HENT: WNL, normocephalic Pulmonary: normal non-labored breathing, without Rales, rhonchi,  wheezing Cardiac: regular Abdomen:  soft, NT/ND, no masses Skin: without rashes Vascular Exam/Pulses: Palpable pulse in the radial position of the right wrist Extremities: Edematous right arm from fingers to upper arm; ecchymosis around the [facility] fossa surgery site; palpable thrill in the cephalic vein fistula  in the upper arm; grip strength symmetrical; tremor in right hand but  sensation in the hand is symmetrical Musculoskeletal: no muscle wasting or atroph  Neurologic: A&O X 3;  No focal weakness or paresthesias are detected; speech is fluent/normal Psychiatric:  The pt has Normal affect. Lymph:  Unremarkable  CBC    Component Value Date/Time   WBC 6.7 11/25/2023 0323   WBC 6.8 11/25/2023 0323   RBC 3.12 (L) 11/25/2023 0323   RBC 3.16 (L) 11/25/2023 0323   HGB 9.9 (L) 11/25/2023 0323   HGB 10.0 (L) 11/25/2023 0323   HCT 30.6 (L) 11/25/2023 0323   HCT 31.0 (L) 11/25/2023 0323   PLT 133 (L) 11/25/2023 0323   PLT 137 (L) 11/25/2023 0323   MCV 98.1 11/25/2023 0323   MCV 98.1 11/25/2023 0323   MCH 31.7 11/25/2023 0323   MCH 31.6 11/25/2023 0323   MCHC 32.4 11/25/2023 0323   MCHC 32.3 11/25/2023 0323   RDW 17.2 (H) 11/25/2023 0323   RDW 17.2 (H) 11/25/2023 0323   LYMPHSABS 0.8 11/25/2023 0323   MONOABS 1.2 (H) 11/25/2023 0323   EOSABS 0.2 11/25/2023 0323   BASOSABS 0.1 11/25/2023 0323    BMET    Component Value Date/Time   NA 135 11/24/2023 2235   K 4.7 11/24/2023 2235   CL 96 (L) 11/24/2023 2235   CO2 24 11/24/2023 2235   GLUCOSE 89 11/24/2023 2235   BUN 51 (H) 11/24/2023 2235   CREATININE 9.00 (H) 11/24/2023 2235   CALCIUM 7.9 (L) 11/24/2023 2235   GFRNONAA 6 (L) 11/24/2023 2235   GFRAA 8 (L) 11/11/2018 0432    COAGS: Lab Results  Component Value Date   INR 1.2 09/06/2023   INR 1.8 (H) 11/29/2022   INR 1.2 06/24/2022     Non-Invasive Vascular Imaging:   Right upper extremity venous duplex negative for DVT Right upper extremity arterial duplex with distal radial artery occlusion    ASSESSMENT/PLAN: This is a 63 y.o. male with right upper extremity swelling found to have incidental right radial artery occlusion; admitted for treatment of encephalopathy and influenza  Mr. Skoczylas is a 63 year old male status post right brachiocephalic fistula creation by Dr. Randie Heinz 1 week ago.  He has history of a right radial cephalic dialysis  access.  He developed right arm swelling after most recent fistula creation.  Workup included venous duplex which was negative for DVT.  Arterial duplex demonstrates a distal right radial artery occlusion.  On exam he does not have any pain or any motor or sensation deficit in his right hand.  He has a palpable pulse at the wrist which may be radial artery or the prior radiocephalic fistula stump.  At any rate he does not have any ischemic symptoms of the right hand.  He does have moderate swelling of the entire right arm.  Unsure of etiology.  This will likely improve when [facility] is removed.  As his encephalopathy and influenza resolves we can reevaluate and consider right arm fistulogram although given strong thrill in the brachiocephalic fistula, it would be unlikely to find any significant central venous stenosis.  If he develops ischemic symptoms such as tissue loss in the right hand in the future, we would consider ligation of brachiocephalic fistula.  No indication for any intervention currently.  On-call vascular surgeon Dr. Chestine Spore will evaluate the patient later today and provide further treatment plans.   Emilie Rutter PA-C [facility] [phone]  I have seen and evaluated the  patient. I agree with the PA note as documented above.  63 year old male with ESRD that vascular surgery was consulted for right radial occlusion noted on duplex.  He underwent right brachiocephalic AV fistula last week on 11/19/2023.  Op note indicates he had a palpable radial pulse after access placement.  It appears duplex was obtained because could not palpate a radial pulse.  On exam he he has a palpable radial pulse at the wrist.  His hand is warm and well-perfused.  He has no signs of steal syndrome.  His radial occlusion is likely chronic and he has flow through the ulnar with flow through the palmar arch.  His arm swelling is currently tolerable.  I did wrap his arm with an Ace bandage as the fistula  has an excellent thrill.  If the swelling is ongoing we can certainly plan fistulogram at a later date when his encephalopathy is better.  Here with encephalopathy and influenza.  Cephus Shelling, MD [facility] [place] Office: [phone]

## 2023-11-25 NOTE — Progress Notes (Signed)
 Pt receives out-pt HD at [facility] on MWF 5:40 am chair time. Will assist as needed.   Olivia Canter Renal Navigator [phone]

## 2023-11-25 NOTE — Procedures (Signed)
 Patient Name: [name]  MRN: [id number]  Epilepsy Attending: Charlsie Quest  Referring Physician/Provider: Rudene Christians, DO  Date: 11/25/2023 Duration: 23.30 mins  Patient history: 63yo M with ams. EEG to evaluate for seizure  Level of alertness: Awake  AEDs during EEG study: VPA, PGB  Technical aspects: This EEG study was done with scalp electrodes positioned according to the 10-20 International system of electrode placement. Electrical activity was reviewed with band pass filter of 1-70Hz , sensitivity of 7 uV/mm, display speed of 80mm/sec with a 60Hz  notched filter applied as appropriate. EEG data were recorded continuously and digitally stored.  Video monitoring was available and reviewed as appropriate.  Description: The posterior dominant rhythm consists of 7 Hz activity of moderate voltage (25-35 uV) seen predominantly in posterior head regions, symmetric and reactive to eye opening and eye closing. EEG showed continuous generalized 5 to 6 Hz theta slowing. Hyperventilation and photic stimulation were not performed.     ABNORMALITY - Continuous slow, generalized  IMPRESSION: This study is suggestive of mild diffuse encephalopathy. No seizures or epileptiform discharges were seen throughout the recording.  Azhar Knope Annabelle Harman

## 2023-11-25 NOTE — Progress Notes (Signed)
 EEG complete - results pending

## 2023-11-25 NOTE — Evaluation (Signed)
 Occupational Therapy Evaluation Patient Details Name: [name] MRN: [id number] DOB: 1961-05-26 Today's Date: 11/25/2023   History of Present Illness   Pt is a 63 y/o male who presents 11/24/2023 from home with weakness, RUE swelling. Pt found to be positive for the flu. Of note, recent R AV fistula creation 11/19/23. PMH significant for A-fib, CHF, ESRD on HD MWF, chronic systolic dysfunction of L ventricle, CVA, dementia, DM, HTN, L BBB, morbid obesity s/p lap band, heart transplant 2014, PVC, Prostate CA, R THA, R TKA.     Clinical Impressions Prior to admission patient was requiring assist with all adls including feeding which was new from the fistula creation on 3/25.  Currently patient has moderate edema in the R hand/ forearm.  Also patient has tremors in B hands with functional movement, and is max to total A with adls.   Unable to gain specifics with adls due to patient arousal level.  Patient will benefit from continued inpatient follow up therapy, <3 hours/day and continued OT on acute.      If plan is discharge home, recommend the following:   Two people to help with walking and/or transfers;Two people to help with bathing/dressing/bathroom;Assistance with feeding;Assist for transportation;Help with stairs or ramp for entrance     Functional Status Assessment   Patient has had a recent decline in their functional status and demonstrates the ability to make significant improvements in function in a reasonable and predictable amount of time.     Equipment Recommendations   None recommended by OT     Recommendations for Other Services         Precautions/Restrictions   Precautions Precautions: Fall Recall of Precautions/Restrictions: Impaired Restrictions Weight Bearing Restrictions Per Provider Order: No     Mobility Bed Mobility Overal bed mobility: Needs Assistance Bed Mobility: Rolling, Supine to Sit, Sit to Sidelying Rolling: Min assist   Supine  to sit: Min assist, +2 for safety/equipment, HOB elevated, Used rails, Mod assist   Sit to sidelying: Mod assist, +2 for safety/equipment General bed mobility comments: Pt initiated  trunk elevation to long sitting and required increased time. Bed pad utilized for assist to scoot forward and get feet on the floor.    Transfers                   General transfer comment: Unable to progress to OOB transfers this session due to orthostatic hypotension.      Balance Overall balance assessment: Needs assistance Sitting-balance support: Feet supported, Bilateral upper extremity supported Sitting balance-Leahy Scale: Poor Sitting balance - Comments: Requires at least 1 UE support to maintain static sitting Postural control: Posterior lean                                 ADL either performed or assessed with clinical judgement   ADL Overall ADL's : Needs assistance/impaired Eating/Feeding: Total assistance   Grooming: Total assistance   Upper Body Bathing: Bed level;Maximal assistance   Lower Body Bathing: Bed level;Maximal assistance   Upper Body Dressing : Maximal assistance;Bed level   Lower Body Dressing: Maximal assistance;Bed level               Functional mobility during ADLs: Moderate assistance;+2 for physical assistance General ADL Comments: Patient is bed level with adls currently due to decrease arousal and significant hypotension in sitting     Vision   Additional Comments: Needs further  assessment - patient not ale to keep eyes open and with decreased BP, not a focus today.  Noted muscle fasciculations with opening eyes     Perception         Praxis         Pertinent Vitals/Pain Pain Assessment Pain Assessment: Faces Faces Pain Scale: Hurts a little bit Pain Location: R UE Pain Descriptors / Indicators: Guarding Pain Intervention(s): Limited activity within patient's tolerance     Extremity/Trunk Assessment Upper Extremity  Assessment Upper Extremity Assessment: Right hand dominant;RUE deficits/detail;LUE deficits/detail;Generalized weakness RUE Deficits / Details: Notable edema; recent AV fistula creation 3/25.  Patient with moderate intentional tremer LUE Deficits / Details: Patient with min intentional tremor   Lower Extremity Assessment Lower Extremity Assessment: Defer to PT evaluation   Cervical / Trunk Assessment Cervical / Trunk Assessment: Other exceptions Cervical / Trunk Exceptions: Forward head posture with rounded shoulders   Communication Communication Communication: Impaired Factors Affecting Communication: Reduced clarity of speech   Cognition Arousal: Lethargic, Obtunded Behavior During Therapy: Flat affect                                 Following commands: Impaired Following commands impaired: Follows one step commands inconsistently, Follows one step commands with increased time     Cueing  General Comments   Cueing Techniques: Verbal cues;Gestural cues;Tactile cues  no family in room   Exercises     Shoulder Instructions      Home Living Family/patient expects to be discharged to:: Private residence Living Arrangements: Spouse/significant other Available Help at Discharge: Family;Available 24 hours/day Type of Home: House Home Access: Ramped entrance     Home Layout: One level     Bathroom Shower/Tub: Walk-in shower;Sponge bathes at baseline   [employer]: Standard     Home Equipment: Agricultural consultant (2 wheels);Shower seat;Wheelchair - manual;BSC/3in1;Lift chair;Hospital bed   Additional Comments: Information taken from pt and filled in with information from prior admission in January of this year      Prior Functioning/Environment Prior Level of Function : Needs assist             Mobility Comments: RW and physical assist to w/c, fall within the past 6 months. Information taken from patient and chart review. ADLs Comments: takes  access bus to HD, assist for all ADLs. Difficulty feeding himself in days immediately prior to admission. Information taken from patient and chart review.    OT Problem List: Decreased strength;Decreased activity tolerance;Impaired balance (sitting and/or standing);Decreased coordination   OT Treatment/Interventions: Self-care/ADL training;Therapeutic activities;Patient/family education;Balance training      OT Goals(Current goals can be found in the care plan section)   Acute Rehab OT Goals OT Goal Formulation: With patient Time For Goal Achievement: 12/09/23 Potential to Achieve Goals: Fair   OT Frequency:  Min 2X/week    Co-evaluation   Reason for Co-Treatment: Complexity of the patient's impairments (multi-system involvement);For patient/therapist safety;To address functional/ADL transfers PT goals addressed during session: Mobility/safety with mobility;Balance        AM-PAC OT "6 Clicks" Daily Activity     Outcome Measure Help from another person eating meals?: Total Help from another person taking care of personal grooming?: Total Help from another person toileting, which includes using toliet, bedpan, or urinal?: A Lot Help from another person bathing (including washing, rinsing, drying)?: A Lot Help from another person to put on and taking off regular upper body clothing?:  A Lot Help from another person to put on and taking off regular lower body clothing?: A Lot 6 Click Score: 10   End of Session Nurse Communication: Mobility status  Activity Tolerance: Treatment limited secondary to medical complications (Comment) Patient left: in bed;with call bell/phone within reach;with bed alarm set  OT Visit Diagnosis: Feeding difficulties (R63.3);Muscle weakness (generalized) (M62.81);Other abnormalities of gait and mobility (R26.89)                Time: 1610-9604 OT Time Calculation (min): 27 min Charges:  OT General Charges $OT Visit: 1 Visit  Hal Neer OTR/L    Malachi Bonds 11/25/2023, 12:04 PM

## 2023-11-25 NOTE — Progress Notes (Addendum)
 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   [facility] will continue to follow for discharge disposition.  Please call with any Integrated Health Services related questions or concerns.  Thank you, Thea Gist, BSN, RN [phone]

## 2023-11-25 NOTE — Evaluation (Signed)
 Physical Therapy Evaluation  Patient Details Name: [name] MRN: [id number] DOB: November 02, 1960 Today's Date: 11/25/2023  History of Present Illness  Pt is a 63 y/o male who presents 11/24/2023 from home with weakness, RUE swelling. Pt found to be positive for the flu. Of note, recent R AV fistula creation 11/19/23. PMH significant for A-fib, CHF, ESRD on HD MWF, chronic systolic dysfunction of L ventricle, CVA, dementia, DM, HTN, L BBB, morbid obesity s/p lap band, heart transplant 2014, PVC, Prostate CA, R THA, R TKA.   Clinical Impression  Pt admitted with above diagnosis. Pt currently with functional limitations due to the deficits listed below (see PT Problem List). At the time of PT eval pt was able to perform transfer to/from EOB with assistance, however mobility limited by symptomatic orthostatic hypotension. See below for details. Recommend post-acute rehab <3 hours/day to maximize functional independence and facilitate return home with family support. Acutely, pt will benefit from skilled PT to increase their independence and safety with mobility to allow discharge.       Orthostatic BPs  Supine 89/72  Sitting 55/19  Return to supine 125/99  Supine HOB 25 95/63         If plan is discharge home, recommend the following: A lot of help with walking and/or transfers;A lot of help with bathing/dressing/bathroom;Assistance with cooking/housework;Assist for transportation;Help with stairs or ramp for entrance;Supervision due to cognitive status   Can travel by private vehicle   No    Equipment Recommendations None recommended by PT  Recommendations for Other Services       Functional Status Assessment Patient has had a recent decline in their functional status and demonstrates the ability to make significant improvements in function in a reasonable and predictable amount of time.     Precautions / Restrictions Precautions Precautions: Fall Recall of Precautions/Restrictions:  Impaired Restrictions Weight Bearing Restrictions Per Provider Order: No      Mobility  Bed Mobility Overal bed mobility: Needs Assistance Bed Mobility: Rolling, Supine to Sit, Sit to Sidelying Rolling: Min assist   Supine to sit: Min assist, +2 for safety/equipment, HOB elevated, Used rails, Mod assist   Sit to sidelying: Mod assist, +2 for safety/equipment General bed mobility comments: Pt initiated  trunk elevation to long sitting and required increased time. Bed pad utilized for assist to scoot forward and get feet on the floor.    Transfers                   General transfer comment: Unable to progress to OOB transfers this session due to orthostatic hypotension.    Ambulation/Gait                  Stairs            Wheelchair Mobility     Tilt Bed    Modified Rankin (Stroke Patients Only)       Balance Overall balance assessment: Needs assistance Sitting-balance support: Feet supported, Bilateral upper extremity supported Sitting balance-Leahy Scale: Poor Sitting balance - Comments: Requires at least 1 UE support to maintain static sitting Postural control: Posterior lean     Standing balance comment: Unable to assess                             Pertinent Vitals/Pain Pain Assessment Pain Assessment: Faces Faces Pain Scale: Hurts a little bit Pain Location: R UE Pain Descriptors / Indicators: Guarding Pain Intervention(s):  Limited activity within patient's tolerance, Monitored during session, Repositioned    Home Living Family/patient expects to be discharged to:: Private residence Living Arrangements: Spouse/significant other Available Help at Discharge: Family;Available 24 hours/day Type of Home: House Home Access: Ramped entrance       Home Layout: One level Home Equipment: Agricultural consultant (2 wheels);Shower seat;Wheelchair - manual;BSC/3in1;Lift chair;Hospital bed Additional Comments: Information taken from pt  and filled in with information from prior admission in January of this year    Prior Function Prior Level of Function : Needs assist             Mobility Comments: RW and physical assist to w/c, fall within the past 6 months. Information taken from patient and chart review. ADLs Comments: takes access bus to HD, assist for all ADLs. Difficulty feeding himself in days immediately prior to admission. Information taken from patient and chart review.     Extremity/Trunk Assessment   Upper Extremity Assessment Upper Extremity Assessment: RUE deficits/detail;Right hand dominant RUE Deficits / Details: Notable edema; recent AV fistula creation 3/25    Lower Extremity Assessment Lower Extremity Assessment: Generalized weakness    Cervical / Trunk Assessment Cervical / Trunk Assessment: Other exceptions Cervical / Trunk Exceptions: Forward head posture with rounded shoulders  Communication   Communication Communication: Impaired Factors Affecting Communication: Reduced clarity of speech    Cognition Arousal: Lethargic, Obtunded Behavior During Therapy: Flat affect   PT - Cognitive impairments: History of cognitive impairments                         Following commands: Impaired Following commands impaired: Follows one step commands inconsistently, Follows one step commands with increased time     Cueing Cueing Techniques: Verbal cues, Gestural cues, Tactile cues     General Comments      Exercises     Assessment/Plan    PT Assessment Patient needs continued PT services  PT Problem List Decreased strength;Decreased activity tolerance;Decreased balance;Decreased mobility;Decreased knowledge of use of DME;Decreased safety awareness;Decreased knowledge of precautions;Pain;Decreased cognition       PT Treatment Interventions DME instruction;Gait training;Stair training;Functional mobility training;Therapeutic activities;Therapeutic exercise;Balance  training;Patient/family education    PT Goals (Current goals can be found in the Care Plan section)  Acute Rehab PT Goals Patient Stated Goal: Pt reports agreeable to post-acute rehab PT Goal Formulation: With patient Time For Goal Achievement: 12/09/23 Potential to Achieve Goals: Good    Frequency Min 2X/week     Co-evaluation PT/OT/SLP Co-Evaluation/Treatment: Yes Reason for Co-Treatment: Complexity of the patient's impairments (multi-system involvement);For patient/therapist safety;To address functional/ADL transfers PT goals addressed during session: Mobility/safety with mobility;Balance         AM-PAC PT "6 Clicks" Mobility  Outcome Measure Help needed turning from your back to your side while in a flat bed without using bedrails?: A Little Help needed moving from lying on your back to sitting on the side of a flat bed without using bedrails?: A Lot Help needed moving to and from a bed to a chair (including a wheelchair)?: Total Help needed standing up from a chair using your arms (e.g., wheelchair or bedside chair)?: Total Help needed to walk in hospital room?: Total Help needed climbing 3-5 steps with a railing? : Total 6 Click Score: 9    End of Session   Activity Tolerance: Treatment limited secondary to medical complications (Comment) (Orthostatic hypotension) Patient left: in bed;with call bell/phone within reach;with bed alarm set Nurse Communication: Mobility  status;Other (comment) (BP status, need for assist to feed breakfast) PT Visit Diagnosis: Unsteadiness on feet (R26.81);Difficulty in walking, not elsewhere classified (R26.2)    Time: 1610-9604 PT Time Calculation (min) (ACUTE ONLY): 26 min   Charges:   PT Evaluation $PT Eval Moderate Complexity: 1 Mod   PT General Charges $$ ACUTE PT VISIT: 1 Visit         Conni Slipper, PT, DPT Acute Rehabilitation Services Secure Chat Preferred Office: [phone]   Marylynn Pearson 11/25/2023, 11:09  AM

## 2023-11-25 NOTE — Care Management Obs Status (Signed)
 MEDICARE OBSERVATION STATUS NOTIFICATION   Patient Details  Name: CHUN SELLEN MRN: [id number] Date of Birth: 23-Aug-1961   Medicare Observation Status Notification Given:  Yes    Tom-Johnson, Hershal Coria, RN 11/25/2023, 5:20 PM

## 2023-11-25 NOTE — Progress Notes (Signed)
 CSW received consult for possible SNF placement at time of discharge. Patient currently off the floor. CSW will follow back up with patient regarding PT recs.when able.

## 2023-11-25 NOTE — Procedures (Signed)
 I was present at the procedure, reviewed the HD regimen and made appropriate changes.   Vinson Moselle MD  [facility] 11/25/2023, 3:48 PM

## 2023-11-25 NOTE — Progress Notes (Addendum)
  Overnight events: None  Subjective: Doing better this morning, more alert.  Still having hiccups, which he says have been going on for several days.  Denies any new symptoms.  Objective:  Vital signs in last 24 hours: Vitals:   11/25/23 0958 11/25/23 1002 11/25/23 1005 11/25/23 1144  BP: (!) 55/19 (!) 125/99 95/63 108/71  Pulse: (!) 211  (!) 140 69  Resp:      Temp:      TempSrc:      SpO2:   92%   Height:       Physical Exam: Constitutional: Well-appearing, laying in bed, hiccuping intermittently Cardio: Regular rate and rhythm, no murmurs; bilateral LE edema Pulm: Mild decrease in lung sounds in left base, normal respiratory rate and effort Abdomen: Soft, nontender, nondistended Skin: several ulcerations on the toes of the left foot MSK: non-pitting edema on right from shoulder to wrist; prior incision from fistula creation noted; non-tender to palpation, hand is perfused, no neuro deficits Neuro: Alert and oriented x 3; mild tremor in right hand; no new focal deficits Psych: Normal mood and affect  Labs: Hgb 9.9, chronic Thrombocytopenia 133  Assessment/Plan: Principal Problem:   Acute metabolic encephalopathy  Acute metabolic encephalopathy Influenza A Feels better today, remains afebrile.  He is more alert than yesterday and oriented x 3.  Does not have any leukocytosis but he is immunosuppressed.  He does have a left lower lobe consolidation on CT scan in the same region as a large pneumonia he had last year. Question whether this is scarring vs atelectasis vs pneumonia. Less concern for bacterial pneumonia clinically. Mental status improvement with tamiflu is reassuring. EEG showed mild diffuse encephalopathy, no seizures. Suspect mental status changes are in the setting of infection with underlying dementia.  - Continue tamiflu - PT/OT; will likely need SNF placement  Hiccups Ongoing since last Friday, unclear etiology. Possible secondary to GERD or diaphragmatic  irritation. Will trial a PPI. This may also improve with resolution of influenza.   RUE swelling Had a RUE fistula creation on 3/25, which is when this increased swelling began. No changes in swelling, no new neurologic symptoms this morning. DVT ultrasound negative. Vascular arterial US this afternoon showed obstruction in right radial artery. We will consult vascular surgery.   Ulceration of left toes History of dry gangrene Admitted for gangrene in January. Follows with vascular surgery, podiatry outpatient. Less concern for acute infection currently, but given poor blood flow, may need TMA per surgery. Will reassess foot tomorrow. Outpatient follow up with podiatry tomorrow.   Thrombocytopenia Platelets 133, which is new thrombocytopenia. Low platelets possible secondary to ESRD, depakote side effect. Peripheral smear pending. CBC in am.   Chronic conditions: Dementia: Follows with [facility]. On abilify, trazodone, depakote. Delirium precautions. Held trazodone.  History of cardiac transplant, 2014: Follows with [facility]. On cyclosporine 125 mg BID, prednisolone 5 mg daily.  Chronic normocytic anemia: Baseline hgb 10-11. Will recheck CBC in am.  History of peripheral neuropathy: On duloxetine, lyrica.  ESRD on HD MWF: scheduled for dialysis today; will message nephro to get him on list. On midodrine 10 mg TID.  Atrial fibrillation: On eliquis 5 mg BID.   Diet: Normal IVF: None VTE: Eliquis Code: Full   Dispo: Anticipated discharge to  Home vs SNF  pending medical stability and safe dispo plan.   Annett Fabian, MD 11/25/2023, 1:47 PM Pager: [phone] After 5pm on weekdays and 1pm on weekends: On Call pager [phone]

## 2023-11-26 ENCOUNTER — Other Ambulatory Visit (HOSPITAL_COMMUNITY): Payer: Self-pay

## 2023-11-26 DIAGNOSIS — G9341 Metabolic encephalopathy: Secondary | ICD-10-CM | POA: Diagnosis not present

## 2023-11-26 DIAGNOSIS — F02C0 Dementia in other diseases classified elsewhere, severe, without behavioral disturbance, psychotic disturbance, mood disturbance, and anxiety: Secondary | ICD-10-CM | POA: Diagnosis not present

## 2023-11-26 LAB — CBC
HCT: 30.5 % — ABNORMAL LOW (ref 39.0–52.0)
Hemoglobin: 9.9 g/dL — ABNORMAL LOW (ref 13.0–17.0)
MCH: 31.2 pg (ref 26.0–34.0)
MCHC: 32.5 g/dL (ref 30.0–36.0)
MCV: 96.2 fL (ref 80.0–100.0)
Platelets: 141 10*3/uL — ABNORMAL LOW (ref 150–400)
RBC: 3.17 MIL/uL — ABNORMAL LOW (ref 4.22–5.81)
RDW: 16.8 % — ABNORMAL HIGH (ref 11.5–15.5)
WBC: 6.6 10*3/uL (ref 4.0–10.5)
nRBC: 0 % (ref 0.0–0.2)

## 2023-11-26 LAB — PATHOLOGIST SMEAR REVIEW

## 2023-11-26 LAB — GLUCOSE, CAPILLARY
Glucose-Capillary: 73 mg/dL (ref 70–99)
Glucose-Capillary: 118 mg/dL — ABNORMAL HIGH (ref 70–99)
Glucose-Capillary: 92 mg/dL (ref 70–99)
Glucose-Capillary: 158 mg/dL — ABNORMAL HIGH (ref 70–99)

## 2023-11-26 LAB — BASIC METABOLIC PANEL WITH GFR
Anion gap: 10 (ref 5–15)
BUN: 35 mg/dL — ABNORMAL HIGH (ref 8–23)
CO2: 27 mmol/L (ref 22–32)
Calcium: 7.8 mg/dL — ABNORMAL LOW (ref 8.9–10.3)
Chloride: 97 mmol/L — ABNORMAL LOW (ref 98–111)
Creatinine, Ser: 6.97 mg/dL — ABNORMAL HIGH (ref 0.61–1.24)
GFR, Estimated: 8 mL/min — ABNORMAL LOW (ref >60)
Glucose, Bld: 71 mg/dL (ref 70–99)
Potassium: 4.1 mmol/L (ref 3.5–5.1)
Sodium: 134 mmol/L — ABNORMAL LOW (ref 135–145)

## 2023-11-26 LAB — HEPATITIS B SURFACE ANTIBODY, QUANTITATIVE: Hep B S AB Quant (Post): 58.8 m[IU]/mL

## 2023-11-26 MED ORDER — WHITE PETROLATUM EX OINT: TOPICAL_OINTMENT | CUTANEOUS | Status: DC | PRN

## 2023-11-26 MED ORDER — PANTOPRAZOLE SODIUM 40 MG PO TBEC
40.0000 mg | DELAYED_RELEASE_TABLET | Freq: Every day | ORAL | 0 refills | Status: DC
  Filled 2023-11-26: qty 20, 20d supply, fill #0

## 2023-11-26 MED ORDER — WHITE PETROLATUM EX OINT
1.0000 | TOPICAL_OINTMENT | CUTANEOUS | 0 refills | Status: AC | PRN
  Filled 2023-11-26: qty 30, fill #0

## 2023-11-26 MED ORDER — CHLORHEXIDINE GLUCONATE CLOTH 2 % EX PADS
6.0000 | MEDICATED_PAD | Freq: Every day | CUTANEOUS | Status: DC
  Administered 2023-11-27: 6 via TOPICAL

## 2023-11-26 MED ORDER — OSELTAMIVIR PHOSPHATE 30 MG PO CAPS
30.0000 mg | ORAL_CAPSULE | ORAL | 0 refills | Status: DC
  Filled 2023-11-26: qty 3, 7d supply, fill #0

## 2023-11-26 NOTE — Progress Notes (Addendum)
 Physical Therapy Treatment  Patient Details Name: [name] MRN: [id number] DOB: 03-30-1961 Today's Date: 11/26/2023   History of Present Illness Pt is a 63 y/o male who presents 11/24/2023 from home with weakness, RUE swelling. Pt found to be positive for the flu. Of note, recent R AV fistula creation 11/19/23. PMH significant for A-fib, CHF, ESRD on HD MWF, chronic systolic dysfunction of L ventricle, CVA, dementia, DM, HTN, L BBB, morbid obesity s/p lap band, heart transplant 2014, PVC, Prostate CA, R THA, R TKA.    PT Comments  Pt more alert this date with wife present in room. Wife reports that at baseline, she is assisting pt with all mobility and ADL's, and pt had progressed to ambulating short distances (10' maximum) with increased time. We were unable to progress to standing this session. Wife present to witness attempts. Continue to recommend post-acute rehab however pt and wife prefer return home at d/c with [facility] therapies to follow up. Long discussion with wife regarding return home and [facility] vs outpatient PT follow up, and DME use at home including a Nurse, adult. Of note, BP 90/63 at start of session with HOB elevated in supine, down to 86/65 in sitting after 3 minutes. Pt reports asymptomatic throughout session. Will continue to follow and progress as able per POC.    If plan is discharge home, recommend the following: Assistance with cooking/housework;Assist for transportation;Help with stairs or ramp for entrance;Supervision due to cognitive status;Two people to help with walking and/or transfers;A lot of help with bathing/dressing/bathroom;Assistance with feeding   Can travel by private vehicle     No  Equipment Recommendations  None recommended by PT    Recommendations for Other Services       Precautions / Restrictions Precautions Precautions: Fall Recall of Precautions/Restrictions: Impaired Restrictions Weight Bearing Restrictions Per Provider Order: No     Mobility   Bed Mobility Overal bed mobility: Needs Assistance Bed Mobility: Supine to Sit, Sit to Supine     Supine to sit: Contact guard, Used rails, HOB elevated Sit to supine: Mod assist, +2 for physical assistance, +2 for safety/equipment   General bed mobility comments: HOB elevated and pt was able to advance LE's around to EOB without assistance. Pt moves slowly and requires significantly increased time and effort to complete transfer fully to EOB.    Transfers Overall transfer level: Needs assistance                 General transfer comment: Unable to clear hips on elevated bed surface. Anticipate pt will require +2 assist to achieve full stand. Wife present in room to observe unsuccessful attempts at standing.    Ambulation/Gait               General Gait Details: Unable   Stairs             Wheelchair Mobility     Tilt Bed    Modified Rankin (Stroke Patients Only)       Balance Overall balance assessment: Needs assistance Sitting-balance support: Feet supported, Bilateral upper extremity supported Sitting balance-Leahy Scale: Poor Sitting balance - Comments: Requires at least 1 UE support to maintain static sitting Postural control: Posterior lean     Standing balance comment: Unable to assess                            Communication Communication Communication: Impaired Factors Affecting Communication: Reduced clarity of speech  Cognition Arousal: Lethargic Behavior During Therapy: Flat affect   PT - Cognitive impairments: History of cognitive impairments                         Following commands: Impaired Following commands impaired: Follows one step commands with increased time    Cueing Cueing Techniques: Verbal cues, Gestural cues, Tactile cues  Exercises      General Comments        Pertinent Vitals/Pain Pain Assessment Pain Assessment: Faces Faces Pain Scale: No hurt Pain Intervention(s): Repositioned     Home Living                          Prior Function            PT Goals (current goals can now be found in the care plan section) Acute Rehab PT Goals Patient Stated Goal: Return home with wife PT Goal Formulation: With patient Time For Goal Achievement: 12/09/23 Potential to Achieve Goals: Good Progress towards PT goals: Progressing toward goals    Frequency    Min 2X/week      PT Plan      Co-evaluation              AM-PAC PT "6 Clicks" Mobility   Outcome Measure  Help needed turning from your back to your side while in a flat bed without using bedrails?: A Little Help needed moving from lying on your back to sitting on the side of a flat bed without using bedrails?: A Little Help needed moving to and from a bed to a chair (including a wheelchair)?: Total Help needed standing up from a chair using your arms (e.g., wheelchair or bedside chair)?: Total Help needed to walk in hospital room?: Total Help needed climbing 3-5 steps with a railing? : Total 6 Click Score: 10    End of Session Equipment Utilized During Treatment: Gait belt Activity Tolerance: Patient limited by fatigue;Other (comment) (weakness) Patient left: in bed;with call bell/phone within reach;with bed alarm set;with family/visitor present Nurse Communication: Mobility status PT Visit Diagnosis: Unsteadiness on feet (R26.81);Difficulty in walking, not elsewhere classified (R26.2)     Time: 1430-1520 PT Time Calculation (min) (ACUTE ONLY): 50 min  Charges:    $Gait Training: 23-37 mins $Therapeutic Activity: 8-22 mins PT General Charges $$ ACUTE PT VISIT: 1 Visit                     Conni Slipper, PT, DPT Acute Rehabilitation Services Secure Chat Preferred Office: [phone]    Marylynn Pearson 11/26/2023, 3:44 PM

## 2023-11-26 NOTE — Progress Notes (Addendum)
  Overnight events: None  Subjective: Doing well this morning, wife is bedside. She says he is better than when he came to the hospital. Still having intermittent hiccups. Cough is better. No pain in RUE, swelling mildly improved.   Objective:  Vital signs in last 24 hours: Vitals:   11/25/23 1912 11/25/23 1935 11/26/23 0616 11/26/23 0758  BP:  91/60 94/64 106/65  Pulse:  80 77 74  Resp:  17 18 18   Temp:  97.6 F (36.4 C) 98.4 F (36.9 C) 99 F (37.2 C)  TempSrc:  Oral  Oral  SpO2:  100%  98%  Weight: 117.1 kg     Height:       Physical Exam: Constitutional: Laying in bed, wife bedside, hiccuping intermittently Cardio: Regular rate and rhythm, no murmurs; bilateral LE edema resolved Pulm: Normal respiratory rate and effort Skin: several ulcerations on the toes of the left foot, history of dry gangrene MSK: RUE edema mildly improved, no tenderness to palpation, right hand well perfused, no focal neurological deficits Neuro: Alert and oriented x 3; resting tremor, most predominant in right hand; no new focal deficits  Labs: Hgb 9.9, stable Thrombocytopenia 141 Schistocytes present on smear Cr improved with dialysis  Assessment/Plan: Principal Problem:   Acute metabolic encephalopathy  Acute encephalopathy Influenza A Seems to be back near baseline mental status per wife, who is bedside. Mild intermittent cough, but no worsening respiratory symptoms.  - Continue tamiflu - Will have PT/OT reassess for possible discharge with home health, as patient would prefer not to go to a SNF. Wife says she is able to help him at home.   Orthostatic hypotension Observed by physical therapy yesterday. We will recheck this today. May need compression stockings after discharge.    Hiccups Ongoing since last Friday, unclear etiology. Possible secondary to GERD or diaphragmatic irritation.  - PPI for 3 weeks, outpatient follow up  RUE swelling Right radial artery occlusion S/p  RUE  fistula creation on 3/25. Right radial artery occlusion visualized on arterial ultrasound. Vascular consulted, recommended no intervention with outpatient follow up.   Ulceration of left toes History of dry gangrene Needs outpatient follow up with podiatry and vascular surgery.   Thrombocytopenia Platelets 141 this morning. Peripheral smear with schistocytes, likely secondary to ESRD. Immunosuppressants may also be contributing.   Chronic conditions: Dementia: Follows with [facility]. On abilify, trazodone, depakote. Delirium precautions. Held trazodone.  History of cardiac transplant, 2014: Follows with [facility]. On cyclosporine 125 mg BID, prednisolone 5 mg daily.  Chronic normocytic anemia: Near baseline hgb 10-11. History of peripheral neuropathy: On duloxetine, lyrica.  ESRD on HD MWF: scheduled for dialysis MWF. On midodrine 10 mg TID.  Atrial fibrillation: On eliquis 5 mg BID.   Diet: Normal IVF: None VTE: Eliquis Code: Full   Dispo: Anticipated discharge to  Home vs SNF  pending medical stability and safe dispo plan.   Annett Fabian, MD 11/26/2023, 10:46 AM Pager: [phone] After 5pm on weekdays and 1pm on weekends: On Call pager [phone]

## 2023-11-26 NOTE — Plan of Care (Signed)
  Problem: Metabolic: Goal: Ability to maintain appropriate glucose levels will improve Outcome: Progressing   Problem: Nutritional: Goal: Maintenance of adequate nutrition will improve Outcome: Progressing   Problem: Skin Integrity: Goal: Risk for impaired skin integrity will decrease Outcome: Progressing   

## 2023-11-26 NOTE — TOC Progression Note (Signed)
 Transition of Care [facility]) - Progression Note    Patient Details  Name: [name] MRN: [id number] Date of Birth: 1961/01/05  [facility]) CM/SW Contact  Tom-Johnson, Hershal Coria, RN Phone Number: 11/26/2023, 4:23 PM  Clinical Narrative:     CM informed that patient will be returning home with home health disciplines through [facility] as patient is currently active with their services including Primary Care, Palliative Care and Home Health SN, PT, and HHA. No resumption of care order needed as patient is on Observation status.  CM spoke with patient and wife [name] at bedside. [name] states [name] but requested hoyer lift and bariatric bsc. Patient received Hosp bed from [facility] from previous hospitalization and [name] requested to order equipments form them. CM called in order to [facility] and equipments will be delivered to patient's home.   Plan for discharge home tomorrow, Sharin Mons will be scheduled at discharge.  CM will continue to follow.       Expected Discharge Plan and Services                                               Social Determinants of Health (SDOH) Interventions SDOH Screenings   Food Insecurity: No Food Insecurity (11/25/2023)  Housing: Low Risk  (11/25/2023)  Transportation Needs: No Transportation Needs (11/25/2023)  Utilities: Not At Risk (11/25/2023)  Depression (PHQ2-9): Low Risk  (05/05/2020)  Recent Concern: Depression (PHQ2-9) - Medium Risk (02/18/2020)  Tobacco Use: Medium Risk (11/25/2023)    Readmission Risk Interventions     No data to display

## 2023-11-26 NOTE — Progress Notes (Signed)
  Progress Note    11/26/2023 7:57 AM * No surgery found *  Subjective:  says he removed ACE wrap on right upper extremity because it was uncomfortable. Feels swelling is better. Denies any pain   Vitals:   11/25/23 1935 11/26/23 0616  BP: 91/60 94/64  Pulse: 80 77  Resp: 17 18  Temp: 97.6 F (36.4 C) 98.4 F (36.9 C)  SpO2: 100%    Physical Exam: Cardiac:  regular Lungs:  non labored Incisions:  right AC incision is intact and healing well Extremities:  RUE well perfused and warm with palpable radial pulse. Good thrill in fistula Neurologic: alert and oriented  CBC    Component Value Date/Time   WBC 6.6 11/26/2023 0325   RBC 3.17 (L) 11/26/2023 0325   HGB 9.9 (L) 11/26/2023 0325   HCT 30.5 (L) 11/26/2023 0325   PLT 141 (L) 11/26/2023 0325   MCV 96.2 11/26/2023 0325   MCH 31.2 11/26/2023 0325   MCHC 32.5 11/26/2023 0325   RDW 16.8 (H) 11/26/2023 0325   LYMPHSABS 0.8 11/25/2023 0323   MONOABS 1.2 (H) 11/25/2023 0323   EOSABS 0.2 11/25/2023 0323   BASOSABS 0.1 11/25/2023 0323    BMET    Component Value Date/Time   NA 134 (L) 11/26/2023 0325   K 4.1 11/26/2023 0325   CL 97 (L) 11/26/2023 0325   CO2 27 11/26/2023 0325   GLUCOSE 71 11/26/2023 0325   BUN 35 (H) 11/26/2023 0325   CREATININE 6.97 (H) 11/26/2023 0325   CALCIUM 7.8 (L) 11/26/2023 0325   GFRNONAA 8 (L) 11/26/2023 0325   GFRAA 8 (L) 11/11/2018 0432    INR    Component Value Date/Time   INR 1.2 09/06/2023 0853     Intake/Output Summary (Last 24 hours) at 11/26/2023 0757 Last data filed at 11/26/2023 1914 Gross per 24 hour  Intake --  Output 2500 ml  Net -2500 ml     Assessment/Plan:  63 y.o. male with likely chronically occluded right radial artery. He is s/p right BC AV fistula creation on 11/19/23 by Dr. Randie Heinz. Fistula has great thrill. RUE swelling present. Improved. Right upper arm remains well perfused. No signs of ischemia. No intervention is indicated. He has follow up with vascular  already on 01/08/24  Graceann Congress, PA-C [facility] [phone] 11/26/2023 7:57 AM

## 2023-11-26 NOTE — Progress Notes (Signed)
 [facility] Progress Note   Subjective:   Tolerated HD yesterday with 2.5L UF. Denies SOB, CP, dizziness, nausea. Still with hiccups.   Objective Vitals:   11/25/23 1912 11/25/23 1935 11/26/23 0616 11/26/23 0758  BP:  91/60 94/64 106/65  Pulse:  80 77 74  Resp:  17 18 18   Temp:  97.6 F (36.4 C) 98.4 F (36.9 C) 99 F (37.2 C)  TempSrc:  Oral  Oral  SpO2:  100%  98%  Weight: 117.1 kg     Height:       Physical Exam General: Alert male in NAD Heart: RRR, no murmurs, rubs or gallops Lungs: CTA bilaterally, respirations unlabored Abdomen: Soft, non-distended, +BS Extremities: no edema b/l lower extremities Dialysis Access: R internal jugular TDC, RUE AVF + bruit, mild edema in arm   Additional Objective Labs: Basic Metabolic Panel: Recent Labs  Lab 11/24/23 1635 11/24/23 2235 11/26/23 0325  NA 136 135 134*  K 5.0 4.7 4.1  CL 95* 96* 97*  CO2 26 24 27   GLUCOSE 97 89 71  BUN 49* 51* 35*  CREATININE 9.01* 9.00* 6.97*  CALCIUM 8.2* 7.9* 7.8*  PHOS  --  7.1*  --    Liver Function Tests: Recent Labs  Lab 11/24/23 1635 11/24/23 2235  AST 15  --   ALT 8  --   ALKPHOS 104  --   BILITOT 1.2  --   PROT 6.0*  --   ALBUMIN 2.8* 2.6*   No results for input(s): "LIPASE", "AMYLASE" in the last 168 hours. CBC: Recent Labs  Lab 11/24/23 1635 11/25/23 0323 11/26/23 0325  WBC 6.8 6.8  6.7 6.6  NEUTROABS 4.9 4.3  --   HGB 11.2* 10.0*  9.9* 9.9*  HCT 35.5* 31.0*  30.6* 30.5*  MCV 99.7 98.1  98.1 96.2  PLT 133* 137*  133* 141*   Blood Culture    Component Value Date/Time   SDES BLOOD BLOOD LEFT ARM 11/24/2023 2235   SPECREQUEST  11/24/2023 2235    BOTTLES DRAWN AEROBIC AND ANAEROBIC Blood Culture adequate volume   CULT  11/24/2023 2235    NO GROWTH 2 DAYS Performed at [facility], [address] [place], [place] [address]    REPTSTATUS PENDING 11/24/2023 2235    Cardiac Enzymes: No results for input(s): "CKTOTAL", "CKMB",  "CKMBINDEX", "TROPONINI" in the last 168 hours. CBG: Recent Labs  Lab 11/25/23 0810 11/25/23 1313 11/25/23 1931 11/25/23 2100 11/26/23 0800  GLUCAP 71 81 63* 88 73   Iron Studies: No results for input(s): "IRON", "TIBC", "TRANSFERRIN", "FERRITIN" in the last 72 hours. @lablastinr3 @ Studies/Results: VAS [place] UPPER EXTREMITY VENOUS DUPLEX Result Date: 11/25/2023 UPPER VENOUS STUDY  Patient Name:  SAMIT SYLVE  Date of Exam:   11/25/2023 Medical Rec #: [id number]       Accession #:    [id number] Date of Birth: April 21, 1961        Patient Gender: M Patient Age:   63 years Exam Location:  [facility] Procedure:      VAS [place] UPPER EXTREMITY VENOUS DUPLEX Referring Phys: JEFFREY HATCHER --------------------------------------------------------------------------------  Indications: Edema Limitations: Poor ultrasound/tissue interface. Comparison Study: No previous exams Performing Technologist: Jody Hill RVT, RDMS  Examination Guidelines: A complete evaluation includes B-mode imaging, spectral Doppler, color Doppler, and power Doppler as needed of all accessible portions of each vessel. Bilateral testing is considered an integral part of a complete examination. Limited examinations for reoccurring indications may be performed as noted.  Right Findings: +----------+------------+---------+-----------+----------+--------------------+ RIGHT     CompressiblePhasicitySpontaneousProperties      Summary        +----------+------------+---------+-----------+----------+--------------------+ IJV           Full       Yes       Yes                                   +----------+------------+---------+-----------+----------+--------------------+ Subclavian               Yes       Yes                   patent by                                                              color/doppler     +----------+------------+---------+-----------+----------+--------------------+ Axillary      Full        Yes       Yes                                   +----------+------------+---------+-----------+----------+--------------------+ Brachial      Full       No        Yes                                   +----------+------------+---------+-----------+----------+--------------------+ Radial        Full                                                       +----------+------------+---------+-----------+----------+--------------------+ Ulnar         Full                                                       +----------+------------+---------+-----------+----------+--------------------+ Cephalic      Full       No        Yes                                   +----------+------------+---------+-----------+----------+--------------------+ Basilic       Full       Yes       Yes                                   +----------+------------+---------+-----------+----------+--------------------+  Left Findings: +----------+------------+---------+-----------+----------+--------------------+ LEFT      CompressiblePhasicitySpontaneousProperties      Summary        +----------+------------+---------+-----------+----------+--------------------+ Subclavian               Yes       Yes  patent by color                                                              doppler        +----------+------------+---------+-----------+----------+--------------------+  Summary:  Right: No evidence of deep vein thrombosis in the upper extremity. No evidence of superficial vein thrombosis in the upper extremity.  Left: No evidence of thrombosis in the subclavian.  *See table(s) above for measurements and observations.  Diagnosing physician: Sherald Hess MD Electronically signed by Sherald Hess MD on 11/25/2023 at 1:45:16 PM.    Final    VAS [place] LOWER EXTREMITY VENOUS (DVT) Result Date: 11/25/2023  Lower Venous DVT Study Patient Name:  LINDWOOD MOGEL  Date of Exam:    11/25/2023 Medical Rec #: [id number]       Accession #:    [id number] Date of Birth: 1961-06-17        Patient Gender: M Patient Age:   63 years Exam Location:  [facility] Procedure:      VAS [place] LOWER EXTREMITY VENOUS (DVT) Referring Phys: JEFFREY HATCHER --------------------------------------------------------------------------------  Performing Technologist: Ernestene Mention RVT, RDMS  Examination Guidelines: A complete evaluation includes B-mode imaging, spectral Doppler, color Doppler, and power Doppler as needed of all accessible portions of each vessel. Bilateral testing is considered an integral part of a complete examination. Limited examinations for reoccurring indications may be performed as noted. The reflux portion of the exam is performed with the patient in reverse Trendelenburg.  +---------+---------------+---------+-----------+----------+--------------+ RIGHT    CompressibilityPhasicitySpontaneityPropertiesThrombus Aging +---------+---------------+---------+-----------+----------+--------------+ CFV      Full           Yes      Yes                                 +---------+---------------+---------+-----------+----------+--------------+ SFJ      Full                                                        +---------+---------------+---------+-----------+----------+--------------+ FV Prox  Full           Yes      Yes                                 +---------+---------------+---------+-----------+----------+--------------+ FV Mid   Full           Yes      Yes                                 +---------+---------------+---------+-----------+----------+--------------+ FV DistalFull           Yes      Yes                                 +---------+---------------+---------+-----------+----------+--------------+ PFV      Full                                                        +---------+---------------+---------+-----------+----------+--------------+  POP       Full                                                        +---------+---------------+---------+-----------+----------+--------------+ PTV      Full                                                        +---------+---------------+---------+-----------+----------+--------------+ PERO     Full                                                        +---------+---------------+---------+-----------+----------+--------------+   +----+---------------+---------+-----------+----------+--------------+ LEFTCompressibilityPhasicitySpontaneityPropertiesThrombus Aging +----+---------------+---------+-----------+----------+--------------+ CFV Full           Yes      Yes                                 +----+---------------+---------+-----------+----------+--------------+     Summary: RIGHT: - There is no evidence of deep vein thrombosis in the lower extremity.  - No cystic structure found in the popliteal fossa. Subcutaneous edema of calf and ankle  LEFT: - No evidence of common femoral vein obstruction.   *See table(s) above for measurements and observations. Electronically signed by Sherald Hess MD on 11/25/2023 at 1:44:56 PM.    Final    VAS [place] UPPER EXTREMITY ARTERIAL DUPLEX Result Date: 11/25/2023  UPPER EXTREMITY DUPLEX STUDY Patient Name:  BRANSTON HALSTED  Date of Exam:   11/25/2023 Medical Rec #: [id number]       Accession #:    [id number] Date of Birth: 1960/12/23        Patient Gender: M Patient Age:   8 years Exam Location:  [facility] Procedure:      VAS [place] UPPER EXTREMITY ARTERIAL DUPLEX Referring Phys: JEFFREY HATCHER --------------------------------------------------------------------------------  Indications: Unable to palpate pulses on patient with edema. History:     Patient has a history of recent brachial/cephalic fistula creation              on 3/25. History of RUE forearm fistula.  Risk Factors:  Hypertension, hyperlipidemia, Diabetes, prior MI (before  heart                transplant), prior CVA. Other Factors: Afib, ESRD (HD), past smokeless tobacco use, Hx of heart                transplant, CHF. Limitations: patient shaking with arm movement, previous UE access surgeries. Comparison Study: No previous exams Performing Technologist: Jody Hill RVT, RDMS  Examination Guidelines: A complete evaluation includes B-mode imaging, spectral Doppler, color Doppler, and power Doppler as needed of all accessible portions of each vessel. Bilateral testing is considered an integral part of a complete examination. Limited examinations for reoccurring indications may be performed as noted.  Right Doppler Findings: +---------------+----------+--------+--------+--------+ Site           PSV (cm/s)WaveformStenosisComments +---------------+----------+--------+--------+--------+ Subclavian Prox74                                 +---------------+----------+--------+--------+--------+  Axillary       1                                  +---------------+----------+--------+--------+--------+ Brachial Prox  99                                 +---------------+----------+--------+--------+--------+ Brachial Dist  43                                 +---------------+----------+--------+--------+--------+ Radial Prox    25                                 +---------------+----------+--------+--------+--------+ Radial Mid                       occluded         +---------------+----------+--------+--------+--------+ Radial Dist                      occluded         +---------------+----------+--------+--------+--------+ Ulnar Prox     42                                 +---------------+----------+--------+--------+--------+ Ulnar Mid      37                                 +---------------+----------+--------+--------+--------+ Ulnar Dist     49                                 +---------------+----------+--------+--------+--------+  Palmar Arch    36                                 +---------------+----------+--------+--------+--------+ Current right side temporary dialysis catheter, history of RUE forearm fistula, recent RUE brachial/cephalic fistula creation.    Summary:  Right: Obstruction noted in the radial artery. All other areas are        patent without evidence of stenosis or occlusion. *See table(s) above for measurements and observations. Electronically signed by Sherald Hess MD on 11/25/2023 at 1:44:39 PM.    Final    EEG adult Result Date: 11/25/2023 Charlsie Quest, MD     11/25/2023 10:34 AM Patient Name: AMANDEEP NESMITH MRN: [id number] Epilepsy Attending: Charlsie Quest Referring Physician/Provider: Rudene Christians, DO Date: 11/25/2023 Duration: 23.30 mins Patient history: 63yo M with ams. EEG to evaluate for seizure Level of alertness: Awake AEDs during EEG study: VPA, PGB Technical aspects: This EEG study was done with scalp electrodes positioned according to the 10-20 International system of electrode placement. Electrical activity was reviewed with band pass filter of 1-70Hz , sensitivity of 7 uV/mm, display speed of 82mm/sec with a 60Hz  notched filter applied as appropriate. EEG data were recorded continuously and digitally stored.  Video monitoring was available and reviewed as appropriate. Description: The posterior dominant rhythm consists of 7 Hz activity of moderate voltage (25-35 uV) seen predominantly in posterior head regions, symmetric and reactive  to eye opening and eye closing. EEG showed continuous generalized 5 to 6 Hz theta slowing. Hyperventilation and photic stimulation were not performed.   ABNORMALITY - Continuous slow, generalized IMPRESSION: This study is suggestive of mild diffuse encephalopathy. No seizures or epileptiform discharges were seen throughout the recording. Charlsie Quest   CT Chest Wo Contrast Result Date: 11/24/2023 CLINICAL DATA:  Pneumonia, complication suspected, xray  done. Cough. EXAM: CT CHEST WITHOUT CONTRAST TECHNIQUE: Multidetector CT imaging of the chest was performed following the standard protocol without IV contrast. RADIATION DOSE REDUCTION: This exam was performed according to the departmental dose-optimization program which includes automated exposure control, adjustment of the mA and/or kV according to patient size and/or use of iterative reconstruction technique. COMPARISON:  11/29/2022.  Chest x-ray today FINDINGS: Cardiovascular: Cardiomegaly. Diffuse coronary artery disease, prior CABG. Aorta normal caliber with moderate aortic atherosclerosis. Markedly enlarged main and central pulmonary arteries. Mediastinum/Nodes: No mediastinal, hilar, or axillary adenopathy. Trachea and esophagus are unremarkable. Thyroid unremarkable. Lungs/Pleura: Left lower lobe airspace opacity could reflect atelectasis or pneumonia. This is less pronounced than on prior study. Linear atelectasis or scarring in the right lung base. No effusions. Upper Abdomen: No acute findings Musculoskeletal: Chest wall soft tissues are unremarkable. No acute bony abnormality. IMPRESSION: Left lower lobe airspace opacity again noted, less pronounced than prior study. This could reflect atelectasis or pneumonia. Cardiomegaly, diffuse coronary artery disease, status post CABG. Markedly dilated pulmonary arteries compatible with pulmonary arterial hypertension. This is stable since prior study. Aortic Atherosclerosis (ICD10-I70.0). Electronically Signed   By: Charlett Nose M.D.   On: 11/24/2023 19:07   CT Head Wo Contrast Result Date: 11/24/2023 CLINICAL DATA:  Mental status change, unknown cause EXAM: CT HEAD WITHOUT CONTRAST TECHNIQUE: Contiguous axial images were obtained from the base of the skull through the vertex without intravenous contrast. RADIATION DOSE REDUCTION: This exam was performed according to the departmental dose-optimization program which includes automated exposure control,  adjustment of the mA and/or kV according to patient size and/or use of iterative reconstruction technique. COMPARISON:  06/19/2023 FINDINGS: Brain: Old right posterior frontal infarct, stable. There is atrophy and chronic small vessel disease changes. No acute intracranial abnormality. Specifically, no hemorrhage, hydrocephalus, mass lesion, acute infarction, or significant intracranial injury. Vascular: No hyperdense vessel or unexpected calcification. Skull: No acute calvarial abnormality. Sinuses/Orbits: No acute findings Other: None IMPRESSION: Old right frontal infarct. Atrophy, chronic microvascular disease. No acute intracranial abnormality. Electronically Signed   By: Charlett Nose M.D.   On: 11/24/2023 19:04   DG Chest Port 1 View Result Date: 11/24/2023 CLINICAL DATA:  Cough. EXAM: PORTABLE CHEST 1 VIEW COMPARISON:  Chest x-ray dated July 04, 2023. FINDINGS: Unchanged tunneled right internal jugular dialysis catheter. Stable cardiomegaly and central pulmonary artery enlargement. Worsened pulmonary vascular congestion and mild interstitial thickening since the prior study. Increased atelectasis versus consolidation in the retrocardiac left lower lobe. No pneumothorax or large pleural effusion. No acute osseous abnormality. IMPRESSION: 1. Worsened mild pulmonary edema. 2. Increased atelectasis versus consolidation in the retrocardiac left lower lobe. Electronically Signed   By: Obie Dredge M.D.   On: 11/24/2023 15:01   Medications:   apixaban  2.5 mg Oral BID   ARIPiprazole  10 mg Oral q morning   atorvastatin  40 mg Oral Daily   Chlorhexidine Gluconate Cloth  6 each Topical Daily   Chlorhexidine Gluconate Cloth  6 each Topical Q0600   cycloSPORINE modified  100 mg Oral BID   And   cycloSPORINE modified  25 mg Oral BID   divalproex  250 mg Oral q AM   divalproex  500 mg Oral QHS   [START ON 11/27/2023] doxercalciferol  4 mcg Intravenous Q M,W,F-HD   DULoxetine  60 mg Oral Daily    insulin aspart  0-6 Units Subcutaneous TID WC   midodrine  10 mg Oral TID WC   oseltamivir  30 mg Oral Q M,W,F-HD   pantoprazole  40 mg Oral Daily   prednisoLONE  5 mg Oral Daily   pregabalin  75 mg Oral Daily   sevelamer carbonate  1,600 mg Oral TID WC   sodium chloride flush  10-40 mL Intracatheter Q12H    Outpatient Dialysis Orders: [facility]  4h  B500   114kg   2K bath   RIJ TDC   Heparin none - hectorol 4 mcg iv three times per week - last HD was 3/28, post wt 117.3kg - comes off 0-2 kg over dry wt    CXR 3/30 - RIJ TDC, no infiltrates  Assessment/Plan: Acute metabolic encephalopathy - due to flu A infection, poss bact PNA, underlying dementia.  RUE swelling - sp new AVF creation on 3/25 by Dr Randie Heinz. Seen by vascular, no intervention planned ESRD: on HD MWF.  HD Wednesday BP: has chronic hypotension on midodrine 10 tid, cont Volume: no sig vol excess on exam. Up by wts. UFG 2L tomorrow as tolerated Anemia of esrd: Hb 10-12 here, follow.  Secondary hyperparathyroidism: CCa in range, phos a bit high. Cont binders ac.   Rogers Blocker, PA-C 11/26/2023, 11:28 AM  [facility] Pager: [phone]

## 2023-11-26 NOTE — Discharge Summary (Signed)
 Name: [name] MRN: [id number] DOB: 04-27-1961 63 y.o. PCP: [facility]  Date of Admission: 11/24/2023  1:39 PM Date of Discharge:  11/27/2023 Attending Physician: Dr. Cleda Daub  DISCHARGE DIAGNOSIS:  Primary Problem: Acute metabolic encephalopathy   Hospital Problems: Principal Problem:   Acute metabolic encephalopathy    DISCHARGE MEDICATIONS:   Allergies as of 11/27/2023       Reactions   Heparin Nausea Only, Swelling, Other (See Comments)   * * HIT * * SWELLING REACTION UNSPECIFIED  DIAPHORESIS   Iodinated Contrast Media    Unknown reaction   Penicillin G Potassium [penicillin G] Nausea Only, Other (See Comments)   DIAPHORESIS High Doses        Medication List     STOP taking these medications    ibuprofen 200 MG tablet Commonly known as: ADVIL       TAKE these medications    acetaminophen 500 MG tablet Commonly known as: TYLENOL Take 1,000 mg by mouth 2 (two) times daily as needed for moderate pain (pain score 4-6).   ARIPiprazole 10 MG tablet Commonly known as: ABILIFY Take 10 mg by mouth every morning.   atorvastatin 40 MG tablet Commonly known as: LIPITOR Take 1 tablet (40 mg total) by mouth daily.   cinacalcet 30 MG tablet Commonly known as: SENSIPAR Take 4 tablets (120 mg total) by mouth daily with supper.   cycloSPORINE modified 100 MG capsule Commonly known as: NEORAL Take 100 mg by mouth See admin instructions. Take with 25 mg for a total of 125 mg twice daily   cycloSPORINE modified 25 MG Caps Take 25 mg by mouth See admin instructions. Take with 100 mg for a total of 125 mg twice daily   divalproex 125 MG DR tablet Commonly known as: DEPAKOTE Take 250 mg by mouth in morning and 500 mg by mouth at night What changed:  how much to take how to take this when to take this   DULoxetine 60 MG capsule Commonly known as: CYMBALTA TAKE 1 CAPSULE(60 MG) BY MOUTH DAILY   Eliquis 2.5 MG Tabs tablet Generic drug:  apixaban Take 1 tablet (2.5 mg total) by mouth 2 (two) times daily.   gentamicin cream 0.1 % Commonly known as: GARAMYCIN Apply 1 Application topically 2 (two) times daily.   hydrocortisone 2.5 % rectal cream Commonly known as: ANUSOL-HC Apply 1 Application topically as needed for hemorrhoids or anal itching.   midodrine 10 MG tablet Commonly known as: PROAMATINE Take 1 tablet (10 mg total) by mouth 3 (three) times daily with meals.   naloxone 4 MG/0.1ML Liqd nasal spray kit Commonly known as: NARCAN Place 0.4 mg into the nose once as needed (opioid overdose).   oseltamivir 30 MG capsule Commonly known as: TAMIFLU Take 1 capsule (30 mg total) by mouth every Monday, Wednesday, and Friday with hemodialysis for 2 doses.   oxyCODONE-acetaminophen 5-325 MG tablet Commonly known as: Percocet Take 1 tablet by mouth every 6 (six) hours as needed (pain). Do not take with other opiate pain medication   pantoprazole 40 MG tablet Commonly known as: PROTONIX Take 1 tablet (40 mg total) by mouth daily for 20 days.   prednisoLONE 5 MG Tabs tablet Take 5 mg by mouth daily.   pregabalin 75 MG capsule Commonly known as: LYRICA Take 1 capsule (75 mg total) by mouth daily.   sevelamer carbonate 800 MG tablet Commonly known as: RENVELA Take 2 tablets (1,600 mg total) by mouth 3 (three) times  daily with meals.   traZODone 100 MG tablet Commonly known as: DESYREL Take 100 mg by mouth at bedtime.   white petrolatum Oint Commonly known as: VASELINE Apply 1 Application topically as needed for dry skin (Can put on the right arm).               Durable Medical Equipment  (From admission, onward)           Start     Ordered   11/26/23 1621  For home use only DME Other see comment  Once       Comments: [employer]. Pt is on a decline with ability to complete sit to stands, not able to ambulate more than 3 steps with therapy team and would need increased support with transfers.  Patient needs +2 to safely transfer OOB without DME (hoyer).  Question:  Length of Need  Answer:  12 Months   11/26/23 1621   11/26/23 1620  For home use only DME Bedside commode  Once       Comments: Bariatric BSC.  Patient is confined to one room, has Generalized Weakness and Decreased Activity Tolerance which necessitate recommendation for bariatric bedside commode as he is not able to ambulate to the bathroom.  Question:  Patient needs a bedside commode to treat with the following condition  Answer:  Weakness   11/26/23 1621             DISPOSITION AND FOLLOW-UP:  Mr.Curley TADASHI BURKEL was discharged from [facility] in Stable condition. At the hospital follow up visit please address:  Influenza A: Ensure completion of Tamiflu (two more doses given during dialysis).  Screen for resolution of symptoms and assess mental status.   Hiccups: Discharged with a 3-week trial of a PPI.  Recommend reassessing in clinic and considering other treatment options as needed.  Right arm swelling, right radial artery occlusion: s/p right brachial artery to cephalic vein fistula creation 3/25, now with right arm swelling. Evaluated by vascular surgery inpatient who recommended no intervention. Ensure outpatient follow up with vascular surgery. Reassess perfusion and screen for neurological symptoms in right hand.   Thrombocytopenia: possibly secondary to ESRD/dialysis or immunosuppressant medication side effect. Recommend rechecking a CBC at follow up visit.   Follow-up Appointments:  Follow-up Information     [facility] Follow up in 1 week(s).   Contact information: [address] [place] [address] [phone]         Felecia Shelling, DPM Follow up on 12/02/2023.   Specialty: Podiatry Contact information: [address] [place] [address] [place] [address] [phone]         Maeola Harman, MD Follow up on 01/08/2024.   Specialties: Vascular  Surgery, Cardiology Contact information: [address] [place] [address] [phone]         [facility] [employer] [facility] Follow up.   Why: Call for equipment delivery Contact information: [address] [place] [address] [phone]                 HOSPITAL COURSE:  Patient Summary: Acute encephalopathy Influenza A Presented with altered mental status.  CT head showed old infarcts but no acute abnormalities.  EEG was nonspecific with mild diffuse encephalopathy.  No leukocytosis, afebrile, hemodynamically stable.  Chest x-ray redemonstrated opacity in the left lower lobe.  Respiratory viral panel was positive for influenza A.  Other than generalized weakness and increased somnolence, he did not demonstrate  any clinical signs of ongoing infection, however he is immunosuppressed in the setting of his heart transplant.  He did not have any new focal neurological deficits.  He was started on Tamiflu which was dosed with his dialysis over the course of 5 doses.  He is on several centrally acting medications and trazodone was stopped in the setting of his increased somnolence.  Electrolytes were not significantly abnormal in the context of his ESRD and he did not miss any recent dialysis appointments.  After 1 day of hospitalization, his mental status improved and he was near his baseline.  Our physical therapy and Occupational Therapy teams evaluated him and recommended rehabilitation in a skilled nursing facility, however the patient did not want to do this and would prefer to go home with home health PT/OT.  His care is managed by [facility].  His wife says they have all the necessary equipment to take care of it at home and she is able to assist as well as his daughter.  With his mental status improved and the reason for his weakness and somnolence identified, he was deemed medically stable for discharge and outpatient follow-up.   Hiccups Ongoing since last  Friday, unclear etiology. Possible secondary to GERD or diaphragmatic irritation.  We started him on Protonix 40 mg for 3 weeks, which seems to be improving his hiccups. Recommend reassessing his hiccups outpatient and considering other treatment options as needed.   RUE swelling Right radial artery occlusion S/p RUE fistula creation on 3/25.  Has had increased right arm swelling since then.  Right radial artery occlusion visualized on arterial ultrasound.  DVT ultrasound was negative.  Vascular consulted, recommended no intervention with outpatient follow up, as they believe this is a chronic occlusion and his hand is well-perfused without any neurological symptoms.  Ulceration of left toes History of dry gangrene Has outpatient follow up appointments scheduled with podiatry and vascular surgery.    Thrombocytopenia Platelets low on admission, decreased from baseline.  Peripheral smear with schistocytes, likely secondary to ESRD.  No evidence of acute hemolysis, hemoglobin has been stable.  Immunosuppressants may also be contributing.  Recommend following up outpatient.   Dementia Follows with [facility]. On abilify, trazodone, depakote.  Held trazodone in the setting of increased somnolence.  Recommend reevaluating these medications outpatient.  History of cardiac transplant, 2014 Follows with [facility]. On cyclosporine 125 mg BID, prednisolone 5 mg daily.   Chronic normocytic anemia Near baseline hgb 10-11 during his hospitalization.  No new concerns.  History of peripheral neuropathy On duloxetine, lyrica.  Reassess outpatient.  ESRD on HD MWF Scheduled for dialysis MWF. On midodrine 10 mg TID.   Atrial fibrillation Stable.  On eliquis 5 mg BID.    DISCHARGE INSTRUCTIONS:   Discharge Instructions     Call MD for:  difficulty breathing, headache or visual disturbances   Complete by: As directed    Call MD for:  extreme fatigue   Complete by: As directed    Call MD  for:  persistant dizziness or light-headedness   Complete by: As directed    Call MD for:  persistant nausea and vomiting   Complete by: As directed    Call MD for:  redness, tenderness, or signs of infection (pain, swelling, redness, odor or green/yellow discharge around incision site)   Complete by: As directed    Call MD for:  severe uncontrolled pain   Complete by: As directed    Call MD for:  temperature >100.4  Complete by: As directed    Diet - low sodium heart healthy   Complete by: As directed    Discharge instructions   Complete by: As directed    It was a pleasure taking care of you at [facility].  You were admitted for altered mental status and were found to have influenza A.  You were treated with Tamiflu and your symptoms are improving.  Your right arm was also swelling and was evaluated by the vascular surgery team.  They did not recommend any intervention at this time and will follow-up with you about this in their clinic.  Please make a hospital follow-up appointment with your primary care provider, [facility], in the next 1 or 2 weeks.  They can help you manage your chronic conditions.  You will continue to receive Tamiflu at your next two dialysis sessions.  For your hiccups, you can take Protonix once daily for the next 3 weeks.  If your hiccups continue beyond this timeframe, there are other medications that you can try to help suppress them.  Please follow-up with your primary care provider about this.  You have elected to continue with home health Physical Therapy and Occupational Therapy instead of going to a skilled nursing facility. [facility] should work with you to schedule these visits.   We have placed an order for a Hoyer lift to be delivered to your home.   If you experience any increasing shortness of breath, worsening cough with sputum production, fevers, lightheadedness or dizziness, numbness or tingling in your right arm or hand,  or worsening pain in your right arm, please return to the emergency department for evaluation.  Otherwise, please follow-up with your outpatient doctors.   Increase activity slowly   Complete by: As directed        SUBJECTIVE:   Doing well this morning, evaluated in dialysis. Hiccups have improved. Hypotensive but asymptomatic. No new concerns, ready for discharge after HD.  Discharge Vitals:   BP (!) 87/66   Pulse 71   Temp 98.5 F (36.9 C)   Resp 14   Ht 6\' 7"  (2.007 m)   Wt 115 kg   SpO2 100%   BMI 28.56 kg/m   OBJECTIVE:  Physical Exam: Constitutional: Laying in bed comfortably in dialysis Cardio: Regular rate and rhythm, no murmurs; no LE edema Pulm: Normal respiratory rate and effort Skin: several ulcerations on the toes of the left foot, history of dry gangrene MSK: RUE edema mildly improved, no tenderness to palpation, right hand well perfused, no focal neurological deficits Neuro: Alert and oriented x 3; resting tremor, most predominant in right hand; no new focal deficits  Pertinent Labs, Studies, and Procedures:     Latest Ref Rng & Units 11/27/2023    3:16 AM 11/26/2023    3:25 AM 11/25/2023    3:23 AM  CBC  WBC 4.0 - 10.5 K/uL 4.8  6.6  6.7    6.8   Hemoglobin 13.0 - 17.0 g/dL 9.9  9.9  9.9    16.1   Hematocrit 39.0 - 52.0 % 30.6  30.5  30.6    31.0   Platelets 150 - 400 K/uL 139  141  133    137        Latest Ref Rng & Units 11/27/2023    3:16 AM 11/26/2023    3:25 AM 11/24/2023   10:35 PM  CMP  Glucose 70 - 99 mg/dL 74  71  89   BUN  8 - 23 mg/dL 52  35  51   Creatinine 0.61 - 1.24 mg/dL 1.61  0.96  0.45   Sodium 135 - 145 mmol/L 133  134  135   Potassium 3.5 - 5.1 mmol/L 4.1  4.1  4.7   Chloride 98 - 111 mmol/L 93  97  96   CO2 22 - 32 mmol/L 26  27  24    Calcium 8.9 - 10.3 mg/dL 7.9  7.8  7.9     VAS [place] UPPER EXTREMITY VENOUS DUPLEX Result Date: 11/25/2023 Summary:  Right: No evidence of deep vein thrombosis in the upper extremity. No evidence  of superficial vein thrombosis in the upper extremity.  Left: No evidence of thrombosis in the subclavian.  *See table(s) above for measurements and observations.  Diagnosing physician: Sherald Hess MD Electronically signed by Sherald Hess MD on 11/25/2023 at 1:45:16 PM.    Final    VAS [place] LOWER EXTREMITY VENOUS (DVT) Result Date: 11/25/2023  Summary: RIGHT: - There is no evidence of deep vein thrombosis in the lower extremity.  - No cystic structure found in the popliteal fossa. Subcutaneous edema of calf and ankle  LEFT: - No evidence of common femoral vein obstruction.   *See table(s) above for measurements and observations. Electronically signed by Sherald Hess MD on 11/25/2023 at 1:44:56 PM.    Final    VAS [place] UPPER EXTREMITY ARTERIAL DUPLEX Result Date: 11/25/2023  Summary:  Right: Obstruction noted in the radial artery. All other areas are        patent without evidence of stenosis or occlusion.   EEG adult Result Date: 11/25/2023 IMPRESSION: This study is suggestive of mild diffuse encephalopathy. No seizures or epileptiform discharges were seen throughout the recording. Charlsie Quest     Signed: Annett Fabian, MD Internal Medicine Resident, PGY-1 [facility]  Pager: [phone] 11:20 AM, 11/27/2023

## 2023-11-26 NOTE — Hospital Course (Signed)
 Acute encephalopathy Influenza A Presented with altered mental status.  CT head showed old infarcts but no acute abnormalities.  EEG was nonspecific with mild diffuse encephalopathy.  No leukocytosis, afebrile, hemodynamically stable.  Chest x-ray redemonstrated opacity in the left lower lobe.  Respiratory viral panel was positive for influenza A.  Other than generalized weakness and increased somnolence, he did not demonstrate any clinical signs of ongoing infection, however he is immunosuppressed in the setting of his heart transplant.  He did not have any new focal neurological deficits.  He was started on Tamiflu which was dosed with his dialysis over the course of 5 doses.  He is on several centrally acting medications and trazodone was stopped in the setting of his increased somnolence.  Electrolytes were not significantly abnormal in the context of his ESRD and he did not miss any recent dialysis appointments.  After 1 day of hospitalization, his mental status improved and he was near his baseline.  Our physical therapy and Occupational Therapy teams evaluated him and recommended rehabilitation in a skilled nursing facility, however the patient did not want to do this and would prefer to go home with home health PT/OT.  His care is managed by [facility].  His wife says they have all the necessary equipment to take care of it at home and she is able to assist as well as his daughter.  With his mental status improved and the reason for his weakness and somnolence identified, he was deemed medically stable for discharge and outpatient follow-up.   Hiccups Ongoing since last Friday, unclear etiology. Possible secondary to GERD or diaphragmatic irritation.  We started him on Protonix 40 mg for 3 weeks, which seems to be improving his hiccups. Recommend reassessing his hiccups outpatient and considering other treatment options as needed.   RUE swelling Right radial artery occlusion S/p RUE fistula  creation on 3/25.  Has had increased right arm swelling since then.  Right radial artery occlusion visualized on arterial ultrasound.  DVT ultrasound was negative.  Vascular consulted, recommended no intervention with outpatient follow up, as they believe this is a chronic occlusion and his hand is well-perfused without any neurological symptoms.  Ulceration of left toes History of dry gangrene Has outpatient follow up appointments scheduled with podiatry and vascular surgery.    Thrombocytopenia Platelets low on admission, decreased from baseline.  Peripheral smear with schistocytes, likely secondary to ESRD.  No evidence of acute hemolysis, hemoglobin has been stable.  Immunosuppressants may also be contributing.  Recommend following up outpatient.   Dementia Follows with [facility]. On abilify, trazodone, depakote.  Held trazodone in the setting of increased somnolence.  Recommend reevaluating these medications outpatient.  History of cardiac transplant, 2014 Follows with [facility]. On cyclosporine 125 mg BID, prednisolone 5 mg daily.   Chronic normocytic anemia Near baseline hgb 10-11 during his hospitalization.  No new concerns.  History of peripheral neuropathy On duloxetine, lyrica.  Reassess outpatient.  ESRD on HD MWF Scheduled for dialysis MWF. On midodrine 10 mg TID.   Atrial fibrillation Stable.  On eliquis 5 mg BID.

## 2023-11-27 ENCOUNTER — Other Ambulatory Visit (HOSPITAL_COMMUNITY): Payer: Self-pay

## 2023-11-27 ENCOUNTER — Ambulatory Visit: Admitting: Vascular Surgery

## 2023-11-27 DIAGNOSIS — J111 Influenza due to unidentified influenza virus with other respiratory manifestations: Principal | ICD-10-CM

## 2023-11-27 DIAGNOSIS — G9341 Metabolic encephalopathy: Secondary | ICD-10-CM | POA: Diagnosis not present

## 2023-11-27 LAB — CBC
HCT: 30.6 % — ABNORMAL LOW (ref 39.0–52.0)
Hemoglobin: 9.9 g/dL — ABNORMAL LOW (ref 13.0–17.0)
MCH: 31.3 pg (ref 26.0–34.0)
MCHC: 32.4 g/dL (ref 30.0–36.0)
MCV: 96.8 fL (ref 80.0–100.0)
Platelets: 139 10*3/uL — ABNORMAL LOW (ref 150–400)
RBC: 3.16 MIL/uL — ABNORMAL LOW (ref 4.22–5.81)
RDW: 16.7 % — ABNORMAL HIGH (ref 11.5–15.5)
WBC: 4.8 10*3/uL (ref 4.0–10.5)
nRBC: 0 % (ref 0.0–0.2)

## 2023-11-27 LAB — BASIC METABOLIC PANEL WITH GFR
Anion gap: 14 (ref 5–15)
BUN: 52 mg/dL — ABNORMAL HIGH (ref 8–23)
CO2: 26 mmol/L (ref 22–32)
Calcium: 7.9 mg/dL — ABNORMAL LOW (ref 8.9–10.3)
Chloride: 93 mmol/L — ABNORMAL LOW (ref 98–111)
Creatinine, Ser: 8.87 mg/dL — ABNORMAL HIGH (ref 0.61–1.24)
GFR, Estimated: 6 mL/min — ABNORMAL LOW (ref >60)
Glucose, Bld: 74 mg/dL (ref 70–99)
Potassium: 4.1 mmol/L (ref 3.5–5.1)
Sodium: 133 mmol/L — ABNORMAL LOW (ref 135–145)

## 2023-11-27 LAB — GLUCOSE, CAPILLARY
Glucose-Capillary: 70 mg/dL (ref 70–99)
Glucose-Capillary: 61 mg/dL — ABNORMAL LOW (ref 70–99)
Glucose-Capillary: 84 mg/dL (ref 70–99)

## 2023-11-27 MED ORDER — LIDOCAINE HCL (PF) 1 % IJ SOLN: 5.0000 mL | INTRAMUSCULAR | Status: DC | PRN

## 2023-11-27 MED ORDER — ALTEPLASE 2 MG IJ SOLR: 2.0000 mg | Freq: Once | INTRAMUSCULAR | Status: DC | PRN

## 2023-11-27 MED ORDER — LIDOCAINE-PRILOCAINE 2.5-2.5 % EX CREA: 1.0000 | TOPICAL_CREAM | CUTANEOUS | Status: DC | PRN

## 2023-11-27 MED ORDER — ANTICOAGULANT SODIUM CITRATE 4% (200MG/5ML) IV SOLN
5.0000 mL | Status: DC | PRN
  Administered 2023-11-27: 5 mL
  Filled 2023-11-27: qty 5

## 2023-11-27 MED ORDER — OSELTAMIVIR PHOSPHATE 30 MG PO CAPS
30.0000 mg | ORAL_CAPSULE | ORAL | 0 refills | Status: AC
  Filled 2023-11-27: qty 2, 5d supply, fill #0

## 2023-11-27 MED ORDER — PENTAFLUOROPROP-TETRAFLUOROETH EX AERO: 1.0000 | INHALATION_SPRAY | CUTANEOUS | Status: DC | PRN

## 2023-11-27 NOTE — TOC Transition Note (Signed)
 Transition of Care [facility]) - Discharge Note   Patient Details  Name: [name] MRN: [id number] Date of Birth: 02-Jan-1961  [facility]) CM/SW Contact:  Tom-Johnson, Hershal Coria, RN Phone Number: 11/27/2023, 3:22 PM   Clinical Narrative:     Patient is scheduled for discharge today.  Home health with [facility] info, hospital f/u and discharge instructions on AVS. Prescriptions sent to [facility] pharmacy and patient will receive meds prior discharge. Wife, [name] to transport at discharge.  No further TOC needs noted.        Final next level of care: Home w Home Health Services Barriers to Discharge: Barriers Resolved   Patient Goals and CMS Choice Patient states their goals for this hospitalization and ongoing recovery are:: To return home CMS Medicare.gov Compare Post Acute Care list provided to:: Patient Choice offered to / list presented to : Patient, Spouse      Discharge Placement                Patient to be transferred to facility by: Wife Name of family member notified: [facility]    Discharge Plan and Services Additional resources added to the After Visit Summary for                                       Social Drivers of Health (SDOH) Interventions SDOH Screenings   Food Insecurity: No Food Insecurity (11/25/2023)  Housing: Low Risk  (11/25/2023)  Transportation Needs: No Transportation Needs (11/25/2023)  Utilities: Not At Risk (11/25/2023)  Depression (PHQ2-9): Low Risk  (05/05/2020)  Recent Concern: Depression (PHQ2-9) - Medium Risk (02/18/2020)  Tobacco Use: Medium Risk (11/25/2023)     Readmission Risk Interventions     No data to display

## 2023-11-27 NOTE — Progress Notes (Signed)
 OT Cancellation Note  Patient Details Name: [name] MRN: [id number] DOB: [birth year]   Cancelled Treatment:    Reason Eval/Treat Not Completed: Patient at procedure or test/ unavailable (Patient off unit in HD, OT will attempt again later today as schdule permits) Alfonse Flavors, OTA Acute Rehabilitation Services  Office [phone]  Dewain Penning 11/27/2023, 8:07 AM

## 2023-11-27 NOTE — Progress Notes (Signed)
   11/27/23 1145  Vitals  Temp 98.4 F (36.9 C)  Pulse Rate 67  Resp (!) 21  BP (!) 98/58  SpO2 100 %  Weight 113.7 kg  Type of Weight Post-Dialysis  Oxygen Therapy  O2 Flow Rate (L/min) 2 L/min  Patient Activity (if Appropriate) In bed  Pulse Oximetry Type Continuous  Post Treatment  Dialyzer Clearance Lightly streaked  Hemodialysis Intake (mL) 0 mL  Liters Processed 83.9  Fluid Removed (mL) 1200 mL  Tolerated HD Treatment Yes  Post-Hemodialysis Comments uf goal not met due to low bp pt tolerated well     11/27/23 1145  Vitals  Temp 98.4 F (36.9 C)  Pulse Rate 67  Resp (!) 21  BP (!) 98/58  SpO2 100 %  Weight 113.7 kg  Type of Weight Post-Dialysis  Oxygen Therapy  O2 Flow Rate (L/min) 2 L/min  Patient Activity (if Appropriate) In bed  Pulse Oximetry Type Continuous  Post Treatment  Dialyzer Clearance Lightly streaked  Hemodialysis Intake (mL) 0 mL  Liters Processed 83.9  Fluid Removed (mL) 1200 mL  Tolerated HD Treatment Yes  Post-Hemodialysis Comments uf goal not met due to low bp pt tolerated well   Received patient in bed to unit.  Alert and oriented.  Informed consent signed and in chart.   TX duration:3.5hrs  Patient tolerated well.  Transported back to the room  Alert, without acute distress.  Hand-off given to patient's nurse.   Access used: [facility] Access issues: none  Total UF removed: 1.2L Medication(s) given: midodrine    [facility]

## 2023-11-27 NOTE — Progress Notes (Signed)
 D/C order noted. Contacted [facility] to be advised of pt's d/c today and pt should resume care on Friday.   Olivia Canter Renal Navigator [phone]

## 2023-11-27 NOTE — Progress Notes (Signed)
 [name] Progress Note   Subjective:   Hypotensive at the start of HD but asymptomatic. Denies SOB, CP, dizziness, nausea. Reports hiccups are improving.   Objective Vitals:   11/27/23 0900 11/27/23 0930 11/27/23 1000 11/27/23 1030  BP: (S) (!) 83/66 (!) 89/60 92/64 (!) 87/66  Pulse: 72 72 75 71  Resp: 17 19 16 14   Temp:      TempSrc:      SpO2: 100% 100% 100% 100%  Weight:      Height:       Physical Exam General: Alert male in NAD Heart: RRR, no murmurs, rubs or gallops Lungs: CTA bilaterally, respirations unlabored Abdomen: Soft, non-distended, +BS Extremities: no edema b/l lower extremities Dialysis Access: R internal jugular TDC, RUE AVF + bruit, mild edema in arm   Additional Objective Labs: Basic Metabolic Panel: Recent Labs  Lab 11/24/23 2235 11/26/23 0325 11/27/23 0316  NA 135 134* 133*  K 4.7 4.1 4.1  CL 96* 97* 93*  CO2 24 27 26   GLUCOSE 89 71 74  BUN 51* 35* 52*  CREATININE 9.00* 6.97* 8.87*  CALCIUM 7.9* 7.8* 7.9*  PHOS 7.1*  --   --    Liver Function Tests: Recent Labs  Lab 11/24/23 1635 11/24/23 2235  AST 15  --   ALT 8  --   ALKPHOS 104  --   BILITOT 1.2  --   PROT 6.0*  --   ALBUMIN 2.8* 2.6*   No results for input(s): "LIPASE", "AMYLASE" in the last 168 hours. CBC: Recent Labs  Lab 11/24/23 1635 11/25/23 0323 11/26/23 0325 11/27/23 0316  WBC 6.8 6.8  6.7 6.6 4.8  NEUTROABS 4.9 4.3  --   --   HGB 11.2* 10.0*  9.9* 9.9* 9.9*  HCT 35.5* 31.0*  30.6* 30.5* 30.6*  MCV 99.7 98.1  98.1 96.2 96.8  PLT 133* 137*  133* 141* 139*   Blood Culture    Component Value Date/Time   SDES BLOOD BLOOD LEFT ARM 11/24/2023 2235   SPECREQUEST  11/24/2023 2235    BOTTLES DRAWN AEROBIC AND ANAEROBIC Blood Culture adequate volume   CULT  11/24/2023 2235    NO GROWTH 3 DAYS Performed at [facility], [address], [place], [place] [address]    REPTSTATUS PENDING 11/24/2023 2235    Cardiac Enzymes: No results  for input(s): "CKTOTAL", "CKMB", "CKMBINDEX", "TROPONINI" in the last 168 hours. CBG: Recent Labs  Lab 11/26/23 0800 11/26/23 1232 11/26/23 1603 11/26/23 2104 11/27/23 0721  GLUCAP 73 118* 92 158* 70   Iron Studies: No results for input(s): "IRON", "TIBC", "TRANSFERRIN", "FERRITIN" in the last 72 hours. @lablastinr3 @ Studies/Results: VAS [place] UPPER EXTREMITY VENOUS DUPLEX Result Date: 11/25/2023 UPPER VENOUS STUDY  Patient Name:  [name]  Date of Exam:   11/25/2023 Medical Rec #: [id number]       Accession #:    [id number] Date of Birth: 11/25/1960        Patient Gender: M Patient Age:   63 years Exam Location:  [facility] Procedure:      VAS [place] UPPER EXTREMITY VENOUS DUPLEX Referring Phys: JEFFREY HATCHER --------------------------------------------------------------------------------  Indications: Edema Limitations: Poor ultrasound/tissue interface. Comparison Study: No previous exams Performing Technologist: Jody Hill RVT, RDMS  Examination Guidelines: A complete evaluation includes B-mode imaging, spectral Doppler, color Doppler, and power Doppler as needed of all accessible portions of each vessel. Bilateral testing is considered an integral part of a complete examination. Limited examinations for  reoccurring indications may be performed as noted.  Right Findings: +----------+------------+---------+-----------+----------+--------------------+ RIGHT     CompressiblePhasicitySpontaneousProperties      Summary        +----------+------------+---------+-----------+----------+--------------------+ IJV           Full       Yes       Yes                                   +----------+------------+---------+-----------+----------+--------------------+ Subclavian               Yes       Yes                   patent by                                                              color/doppler      +----------+------------+---------+-----------+----------+--------------------+ Axillary      Full       Yes       Yes                                   +----------+------------+---------+-----------+----------+--------------------+ Brachial      Full       No        Yes                                   +----------+------------+---------+-----------+----------+--------------------+ Radial        Full                                                       +----------+------------+---------+-----------+----------+--------------------+ Ulnar         Full                                                       +----------+------------+---------+-----------+----------+--------------------+ Cephalic      Full       No        Yes                                   +----------+------------+---------+-----------+----------+--------------------+ Basilic       Full       Yes       Yes                                   +----------+------------+---------+-----------+----------+--------------------+  Left Findings: +----------+------------+---------+-----------+----------+--------------------+ LEFT      CompressiblePhasicitySpontaneousProperties      Summary        +----------+------------+---------+-----------+----------+--------------------+ Subclavian               Yes  Yes                patent by color                                                              doppler        +----------+------------+---------+-----------+----------+--------------------+  Summary:  Right: No evidence of deep vein thrombosis in the upper extremity. No evidence of superficial vein thrombosis in the upper extremity.  Left: No evidence of thrombosis in the subclavian.  *See table(s) above for measurements and observations.  Diagnosing physician: Sherald Hess MD Electronically signed by Sherald Hess MD on 11/25/2023 at 1:45:16 PM.    Final    VAS [place] LOWER EXTREMITY  VENOUS (DVT) Result Date: 11/25/2023  Lower Venous DVT Study Patient Name:  [name]  Date of Exam:   11/25/2023 Medical Rec #: [id number]       Accession #:    [id number] Date of Birth: [birth year]        Patient Gender: M Patient Age:   39 years Exam Location:  [facility] Procedure:      VAS [place] LOWER EXTREMITY VENOUS (DVT) Referring Phys: JEFFREY HATCHER --------------------------------------------------------------------------------  Performing Technologist: Ernestene Mention RVT, RDMS  Examination Guidelines: A complete evaluation includes B-mode imaging, spectral Doppler, color Doppler, and power Doppler as needed of all accessible portions of each vessel. Bilateral testing is considered an integral part of a complete examination. Limited examinations for reoccurring indications may be performed as noted. The reflux portion of the exam is performed with the patient in reverse Trendelenburg.  +---------+---------------+---------+-----------+----------+--------------+ RIGHT    CompressibilityPhasicitySpontaneityPropertiesThrombus Aging +---------+---------------+---------+-----------+----------+--------------+ CFV      Full           Yes      Yes                                 +---------+---------------+---------+-----------+----------+--------------+ SFJ      Full                                                        +---------+---------------+---------+-----------+----------+--------------+ FV Prox  Full           Yes      Yes                                 +---------+---------------+---------+-----------+----------+--------------+ FV Mid   Full           Yes      Yes                                 +---------+---------------+---------+-----------+----------+--------------+ FV DistalFull           Yes      Yes                                 +---------+---------------+---------+-----------+----------+--------------+ PFV  Full                                                         +---------+---------------+---------+-----------+----------+--------------+ POP      Full                                                        +---------+---------------+---------+-----------+----------+--------------+ PTV      Full                                                        +---------+---------------+---------+-----------+----------+--------------+ PERO     Full                                                        +---------+---------------+---------+-----------+----------+--------------+   +----+---------------+---------+-----------+----------+--------------+ LEFTCompressibilityPhasicitySpontaneityPropertiesThrombus Aging +----+---------------+---------+-----------+----------+--------------+ CFV Full           Yes      Yes                                 +----+---------------+---------+-----------+----------+--------------+     Summary: RIGHT: - There is no evidence of deep vein thrombosis in the lower extremity.  - No cystic structure found in the popliteal fossa. Subcutaneous edema of calf and ankle  LEFT: - No evidence of common femoral vein obstruction.   *See table(s) above for measurements and observations. Electronically signed by Sherald Hess MD on 11/25/2023 at 1:44:56 PM.    Final    VAS [place] UPPER EXTREMITY ARTERIAL DUPLEX Result Date: 11/25/2023  UPPER EXTREMITY DUPLEX STUDY Patient Name:  JOHNTHAN AXTMAN  Date of Exam:   11/25/2023 Medical Rec #: [id number]       Accession #:    [id number] Date of Birth: 12/07/1960        Patient Gender: M Patient Age:   57 years Exam Location:  [facility] Procedure:      VAS [place] UPPER EXTREMITY ARTERIAL DUPLEX Referring Phys: JEFFREY HATCHER --------------------------------------------------------------------------------  Indications: Unable to palpate pulses on patient with edema. History:     Patient has a history of recent brachial/cephalic fistula creation              on 3/25.  History of RUE forearm fistula.  Risk Factors:  Hypertension, hyperlipidemia, Diabetes, prior MI (before heart                transplant), prior CVA. Other Factors: Afib, ESRD (HD), past smokeless tobacco use, Hx of heart                transplant, CHF. Limitations: patient shaking with arm movement, previous UE access surgeries. Comparison Study: No previous exams Performing Technologist: Jody Hill RVT, RDMS  Examination Guidelines: A complete evaluation includes B-mode imaging, spectral Doppler, color Doppler, and  power Doppler as needed of all accessible portions of each vessel. Bilateral testing is considered an integral part of a complete examination. Limited examinations for reoccurring indications may be performed as noted.  Right Doppler Findings: +---------------+----------+--------+--------+--------+ Site           PSV (cm/s)WaveformStenosisComments +---------------+----------+--------+--------+--------+ Subclavian Prox74                                 +---------------+----------+--------+--------+--------+ Axillary       1                                  +---------------+----------+--------+--------+--------+ Brachial Prox  99                                 +---------------+----------+--------+--------+--------+ Brachial Dist  43                                 +---------------+----------+--------+--------+--------+ Radial Prox    25                                 +---------------+----------+--------+--------+--------+ Radial Mid                       occluded         +---------------+----------+--------+--------+--------+ Radial Dist                      occluded         +---------------+----------+--------+--------+--------+ Ulnar Prox     42                                 +---------------+----------+--------+--------+--------+ Ulnar Mid      37                                 +---------------+----------+--------+--------+--------+ Ulnar  Dist     49                                 +---------------+----------+--------+--------+--------+ Palmar Arch    36                                 +---------------+----------+--------+--------+--------+ Current right side temporary dialysis catheter, history of RUE forearm fistula, recent RUE brachial/cephalic fistula creation.    Summary:  Right: Obstruction noted in the radial artery. All other areas are        patent without evidence of stenosis or occlusion. *See table(s) above for measurements and observations. Electronically signed by Sherald Hess MD on 11/25/2023 at 1:44:39 PM.    Final    Medications:  anticoagulant sodium citrate      apixaban  2.5 mg Oral BID   ARIPiprazole  10 mg Oral q morning   atorvastatin  40 mg Oral Daily   Chlorhexidine Gluconate Cloth  6 each Topical Daily   Chlorhexidine Gluconate Cloth  6 each Topical Q0600   Chlorhexidine Gluconate Cloth  6  each Topical Q0600   cycloSPORINE modified  100 mg Oral BID   And   cycloSPORINE modified  25 mg Oral BID   divalproex  250 mg Oral q AM   divalproex  500 mg Oral QHS   doxercalciferol  4 mcg Intravenous Q M,W,F-HD   DULoxetine  60 mg Oral Daily   insulin aspart  0-6 Units Subcutaneous TID WC   midodrine  10 mg Oral TID WC   oseltamivir  30 mg Oral Q M,W,F-HD   pantoprazole  40 mg Oral Daily   prednisoLONE  5 mg Oral Daily   pregabalin  75 mg Oral Daily   sevelamer carbonate  1,600 mg Oral TID WC   sodium chloride flush  10-40 mL Intracatheter Q12H    OP Dialysis Orders: [facility] MWF  4h  B500   114kg   2K bath   RIJ TDC   Heparin none - hectorol 4 mcg iv three times per week - last HD was 3/28, post wt 117.3kg - comes off 0-2 kg over dry wt    CXR 3/30 - RIJ TDC, no infiltrates  Assessment/Plan: Acute metabolic encephalopathy - due to flu A infection, poss bact PNA, underlying dementia.  RUE swelling - sp new AVF creation on 3/25 by Dr Randie Heinz. Seen by vascular, no intervention planned,  improving ESRD: on HD MWF.  HD Wednesday BP: has chronic hypotension on midodrine 10 tid, asymptomatic Volume: Above EDW but UF is limited by hypotension, see above. Continue midodrine with HD Anemia of esrd: Hb 9.9, not on ESA, will start if next Hb not at goal Secondary hyperparathyroidism: CCa in range, phos a bit high. Cont binders ac.     Rogers Blocker, PA-C 11/27/2023, 11:08 AM  [facility] Pager: [phone]

## 2023-11-28 DIAGNOSIS — R2231 Localized swelling, mass and lump, right upper limb: Secondary | ICD-10-CM | POA: Diagnosis not present

## 2023-11-28 DIAGNOSIS — G20C Parkinsonism, unspecified: Secondary | ICD-10-CM | POA: Diagnosis not present

## 2023-11-28 DIAGNOSIS — N186 End stage renal disease: Secondary | ICD-10-CM | POA: Diagnosis not present

## 2023-11-28 DIAGNOSIS — B349 Viral infection, unspecified: Secondary | ICD-10-CM | POA: Diagnosis not present

## 2023-11-28 NOTE — Discharge Planning (Signed)
 [facility] Patient Discharge Orders- [facility] CLINIC: [place]  Patient's name: [name] Admit/DC Dates: 11/25/2023 - 11/25/2023  Discharge Diagnoses: Acute metabolic encephalopathy   Influenza A  Aranesp: Given: no   Date and amount of last dose: N/A  Last Hgb: 9.9 PRBC's Given: no Date/# of units: N/A ESA dose for discharge: mircera 30 mcg IV q 2 weeks  IV Iron dose at discharge: none  Heparin change: no  EDW Change: no New EDW:   Bath Change: no  Access intervention/Change: no Details:  Hectorol/Calcitriol change: no  Discharge Labs: Calcium 7.9 Phosphorus 7.1 Albumin 2.6 K+ 4.1  IV Antibiotics: no Details:  On Coumadin?: no Last INR: Next INR: Managed By:   OTHER/APPTS/LAB ORDERS: Observation admission    D/C Meds to be reconciled by nurse after every discharge.  Completed By: Rogers Blocker, PA-C 11/28/2023, 8:24 AM  [facility] Pager: [phone]    Reviewed by: MD:______ RN_______

## 2023-11-29 DIAGNOSIS — D631 Anemia in chronic kidney disease: Secondary | ICD-10-CM | POA: Diagnosis not present

## 2023-11-29 DIAGNOSIS — I132 Hypertensive heart and chronic kidney disease with heart failure and with stage 5 chronic kidney disease, or end stage renal disease: Secondary | ICD-10-CM | POA: Diagnosis not present

## 2023-11-29 DIAGNOSIS — N186 End stage renal disease: Secondary | ICD-10-CM | POA: Diagnosis not present

## 2023-11-29 DIAGNOSIS — I509 Heart failure, unspecified: Secondary | ICD-10-CM | POA: Diagnosis not present

## 2023-11-29 DIAGNOSIS — Z992 Dependence on renal dialysis: Secondary | ICD-10-CM | POA: Diagnosis not present

## 2023-11-29 DIAGNOSIS — I96 Gangrene, not elsewhere classified: Secondary | ICD-10-CM | POA: Diagnosis not present

## 2023-11-29 DIAGNOSIS — D509 Iron deficiency anemia, unspecified: Secondary | ICD-10-CM | POA: Diagnosis not present

## 2023-11-29 DIAGNOSIS — E1122 Type 2 diabetes mellitus with diabetic chronic kidney disease: Secondary | ICD-10-CM | POA: Diagnosis not present

## 2023-11-29 DIAGNOSIS — N2581 Secondary hyperparathyroidism of renal origin: Secondary | ICD-10-CM | POA: Diagnosis not present

## 2023-11-29 DIAGNOSIS — E1129 Type 2 diabetes mellitus with other diabetic kidney complication: Secondary | ICD-10-CM | POA: Diagnosis not present

## 2023-11-29 LAB — CULTURE, BLOOD (ROUTINE X 2)
Culture: NO GROWTH
Culture: NO GROWTH
Special Requests: ADEQUATE

## 2023-11-30 DIAGNOSIS — F32A Depression, unspecified: Secondary | ICD-10-CM | POA: Diagnosis not present

## 2023-11-30 DIAGNOSIS — F02818 Dementia in other diseases classified elsewhere, unspecified severity, with other behavioral disturbance: Secondary | ICD-10-CM | POA: Diagnosis not present

## 2023-11-30 DIAGNOSIS — E1122 Type 2 diabetes mellitus with diabetic chronic kidney disease: Secondary | ICD-10-CM | POA: Diagnosis not present

## 2023-11-30 DIAGNOSIS — I493 Ventricular premature depolarization: Secondary | ICD-10-CM | POA: Diagnosis not present

## 2023-11-30 DIAGNOSIS — I2510 Atherosclerotic heart disease of native coronary artery without angina pectoris: Secondary | ICD-10-CM | POA: Diagnosis not present

## 2023-11-30 DIAGNOSIS — I509 Heart failure, unspecified: Secondary | ICD-10-CM | POA: Diagnosis not present

## 2023-11-30 DIAGNOSIS — I951 Orthostatic hypotension: Secondary | ICD-10-CM | POA: Diagnosis not present

## 2023-11-30 DIAGNOSIS — G894 Chronic pain syndrome: Secondary | ICD-10-CM | POA: Diagnosis not present

## 2023-11-30 DIAGNOSIS — G20C Parkinsonism, unspecified: Secondary | ICD-10-CM | POA: Diagnosis not present

## 2023-11-30 DIAGNOSIS — F0283 Dementia in other diseases classified elsewhere, unspecified severity, with mood disturbance: Secondary | ICD-10-CM | POA: Diagnosis not present

## 2023-11-30 DIAGNOSIS — I447 Left bundle-branch block, unspecified: Secondary | ICD-10-CM | POA: Diagnosis not present

## 2023-11-30 DIAGNOSIS — I132 Hypertensive heart and chronic kidney disease with heart failure and with stage 5 chronic kidney disease, or end stage renal disease: Secondary | ICD-10-CM | POA: Diagnosis not present

## 2023-11-30 DIAGNOSIS — I4891 Unspecified atrial fibrillation: Secondary | ICD-10-CM | POA: Diagnosis not present

## 2023-11-30 DIAGNOSIS — E7800 Pure hypercholesterolemia, unspecified: Secondary | ICD-10-CM | POA: Diagnosis not present

## 2023-11-30 DIAGNOSIS — I252 Old myocardial infarction: Secondary | ICD-10-CM | POA: Diagnosis not present

## 2023-11-30 DIAGNOSIS — M1990 Unspecified osteoarthritis, unspecified site: Secondary | ICD-10-CM | POA: Diagnosis not present

## 2023-11-30 DIAGNOSIS — G4733 Obstructive sleep apnea (adult) (pediatric): Secondary | ICD-10-CM | POA: Diagnosis not present

## 2023-11-30 DIAGNOSIS — I420 Dilated cardiomyopathy: Secondary | ICD-10-CM | POA: Diagnosis not present

## 2023-11-30 DIAGNOSIS — Z6828 Body mass index (BMI) 28.0-28.9, adult: Secondary | ICD-10-CM | POA: Diagnosis not present

## 2023-11-30 DIAGNOSIS — D631 Anemia in chronic kidney disease: Secondary | ICD-10-CM | POA: Diagnosis not present

## 2023-11-30 DIAGNOSIS — N186 End stage renal disease: Secondary | ICD-10-CM | POA: Diagnosis not present

## 2023-11-30 DIAGNOSIS — E1142 Type 2 diabetes mellitus with diabetic polyneuropathy: Secondary | ICD-10-CM | POA: Diagnosis not present

## 2023-11-30 DIAGNOSIS — Z7901 Long term (current) use of anticoagulants: Secondary | ICD-10-CM | POA: Diagnosis not present

## 2023-12-02 ENCOUNTER — Encounter: Payer: Self-pay | Admitting: Podiatry

## 2023-12-02 ENCOUNTER — Ambulatory Visit (INDEPENDENT_AMBULATORY_CARE_PROVIDER_SITE_OTHER): Admitting: Podiatry

## 2023-12-02 DIAGNOSIS — M79675 Pain in left toe(s): Secondary | ICD-10-CM | POA: Diagnosis not present

## 2023-12-02 DIAGNOSIS — L97522 Non-pressure chronic ulcer of other part of left foot with fat layer exposed: Secondary | ICD-10-CM

## 2023-12-02 DIAGNOSIS — N2581 Secondary hyperparathyroidism of renal origin: Secondary | ICD-10-CM | POA: Diagnosis not present

## 2023-12-02 DIAGNOSIS — B351 Tinea unguium: Secondary | ICD-10-CM

## 2023-12-02 DIAGNOSIS — D631 Anemia in chronic kidney disease: Secondary | ICD-10-CM | POA: Diagnosis not present

## 2023-12-02 DIAGNOSIS — Z992 Dependence on renal dialysis: Secondary | ICD-10-CM | POA: Diagnosis not present

## 2023-12-02 DIAGNOSIS — E1129 Type 2 diabetes mellitus with other diabetic kidney complication: Secondary | ICD-10-CM | POA: Diagnosis not present

## 2023-12-02 DIAGNOSIS — N186 End stage renal disease: Secondary | ICD-10-CM | POA: Diagnosis not present

## 2023-12-02 DIAGNOSIS — D509 Iron deficiency anemia, unspecified: Secondary | ICD-10-CM | POA: Diagnosis not present

## 2023-12-02 DIAGNOSIS — M79674 Pain in right toe(s): Secondary | ICD-10-CM | POA: Diagnosis not present

## 2023-12-02 NOTE — Progress Notes (Unsigned)
 Chief Complaint  Patient presents with   Wound Check    Ulcers left toes F/U in 2wks    Subjective:  63 y.o. male with PMHx of diabetes mellitus, ESRD on dialysis, CHF, nonambulatory in a wheelchair presenting today for outpatient follow-up of ulcers to the left toes.  Recently discharged from the hospital.  Patient doing well.  They continue to notice significant improvement.  Minimally ambulatory mostly transfer purposes only  Past Medical History:  Diagnosis Date   Anxiety    Arthritis    Atrial fibrillation (HCC)    CHF (congestive heart failure), NYHA class III (HCC)    s/p heart transplant   Chronic systolic dysfunction of left ventricle    CVA (cerebral infarction) 2010   Dementia (HCC)    Depression    Diabetes mellitus    PMH; Prior to heart transplant   ESRD on hemodialysis (HCC)    Family history of coronary artery disease    in both parents   GERD (gastroesophageal reflux disease)    Hyperlipidemia    Hypertension    Left bundle branch block    Morbid obesity (HCC)    status post lap band   Myocardial infarction [facility])    prior to heart transplant   Nonischemic cardiomyopathy (HCC)    prior to heart transplant   Obesity (BMI 30-39.9)    Obstructive sleep apnea    no longer needs CPAP after heart transplant per pt   Premature ventricular contractions    Prostate cancer (HCC)    SOB (shortness of breath)     Past Surgical History:  Procedure Laterality Date   A/V FISTULAGRAM Right 05/23/2023   Procedure: A/V Fistulagram;  Surgeon: Victorino Sparrow, MD;  Location: [facility] INVASIVE CV LAB;  Service: Cardiovascular;  Laterality: Right;   AV FISTULA PLACEMENT Right 07/04/2023   Procedure: ARTERIOVENOUS (AV) FISTULA  REVISION WITH APPLICATION OF ARTEGRAFT RIGHT ARM;  Surgeon: Maeola Harman, MD;  Location: [facility] OR;  Service: Vascular;  Laterality: Right;   AV FISTULA PLACEMENT Right 11/19/2023   Procedure: RIGHT BRACHIOCEPHALIC ARTERIOVENOUS (AV) FISTULA  CREATION;  Surgeon: Maeola Harman, MD;  Location: [facility] OR;  Service: Vascular;  Laterality: Right;   CARDIAC PACEMAKER PLACEMENT  09/21/2009   Biventricular implantable cardioverter-defibrillator implantation      COLONOSCOPY W/ BIOPSIES AND POLYPECTOMY     CYSTOSCOPY WITH FULGERATION N/A 04/27/2021   Procedure: CYSTOSCOPY AND CLOT EVACUATION WITH BLADDER BIOPSY/ FUGARATION OF BLADDER/ FULGARATION OF PROSTATE ;  Surgeon: Jerilee Field, MD;  Location: [facility] ORS;  Service: Urology;  Laterality: N/A;   FOOT SURGERY     left   HEART TRANSPLANT  2014   INSERTION OF DIALYSIS CATHETER Right 07/04/2023   Procedure: INSERTION OF DIALYSIS CATHETER WITH 19 CM PALINDROME;  Surgeon: Maeola Harman, MD;  Location: [facility] OR;  Service: Vascular;  Laterality: Right;   LAPAROSCOPIC GASTRIC BANDING  01/27/2007   LEFT VENTRICULAR ASSIST DEVICE     implanted at [facility]   PACEMAKER REMOVAL     PROSTATE BIOPSY     TOTAL HIP ARTHROPLASTY Right 07/22/2017   Procedure: RIGHT TOTAL HIP ARTHROPLASTY ANTERIOR APPROACH;  Surgeon: Samson Frederic, MD;  Location: [facility] OR;  Service: Orthopedics;  Laterality: Right;  Needs RNFA   TOTAL KNEE ARTHROPLASTY Right 07/22/2017    Allergies  Allergen Reactions   Heparin Nausea Only, Swelling and Other (See Comments)    * * HIT * *  SWELLING REACTION UNSPECIFIED   DIAPHORESIS  Iodinated Contrast Media     Unknown reaction   Penicillin G Potassium [Penicillin G] Nausea Only and Other (See Comments)    DIAPHORESIS High Doses    LT toes 10/02/2023  Objective/Physical Exam General: The patient is alert and oriented x3 in no acute distress.  Dermatology:  Wounds are healed.  No open wounds noted today.  There is some hyperkeratotic dystrophic nails noted to the bilateral feet  Vascular: Chronic edema bilateral lower extremities VAS [place] ABI WITH/WO TBI 09/06/2023 ABI Findings:  +---------+------------------+-----+---------+----------------------+  Right    Rt Pressure (mmHg)IndexWaveform Comment                 +---------+------------------+-----+---------+----------------------+  Brachial 101                    biphasic HX of HD access in RUE  +---------+------------------+-----+---------+----------------------+  PTA                            triphasicnon compressible        +---------+------------------+-----+---------+----------------------+  DP                             triphasicnon compressible        +---------+------------------+-----+---------+----------------------+  Great Toe73                0.67 Normal                           +---------+------------------+-----+---------+----------------------+   +---------+------------------+-----+---------+-------+  Left    Lt Pressure (mmHg)IndexWaveform Comment  +---------+------------------+-----+---------+-------+  Brachial 109                    triphasic         +---------+------------------+-----+---------+-------+  PTA                            triphasic         +---------+------------------+-----+---------+-------+  DP                             triphasic         +---------+------------------+-----+---------+-------+  Great Toe75                0.69 Normal            +---------+------------------+-----+---------+-------+  Arterial wall calcification precludes accurate ankle pressures and ABIs.  Summary:  Right: Resting right ankle-brachial index indicates noncompressible right  lower extremity arteries. The right toe-brachial index is mildly reduced.  Left: Resting left ankle-brachial index indicates noncompressible left  lower extremity arteries. The left toe-brachial index is mildly reduced.   Neurological: Light touch and protective threshold diminished bilaterally.   Musculoskeletal Exam: Range of motion within normal limits to all pedal and ankle joints bilateral. Muscle strength 5/5 in all groups bilateral.   Patient nonambulatory in wheelchair.  No prior amputations  Assessment: 1. Ulcers left toes secondary to diabetes mellitus 2. diabetes mellitus w/ peripheral neuropathy 3.  Chronic edema bilateral lower extremities 4.  Pain due to onychomycosis of toenails both   Plan of Care:  -Patient was evaluated.   -Wounds are healed.  Light debridement of the callus tissue around the wounds was performed today.  There is no open wounds -Mechanical debridement of nails 1-5 bilateral performed using a  nail nipper without incident or bleeding -Return to clinic 3 months routine footcare  Felecia Shelling, DPM [facility]  Dr. Felecia Shelling, DPM    [address] [place], [place] [address]                Office [phone]  Fax [phone]

## 2023-12-04 DIAGNOSIS — D631 Anemia in chronic kidney disease: Secondary | ICD-10-CM | POA: Diagnosis not present

## 2023-12-04 DIAGNOSIS — N186 End stage renal disease: Secondary | ICD-10-CM | POA: Diagnosis not present

## 2023-12-04 DIAGNOSIS — N2581 Secondary hyperparathyroidism of renal origin: Secondary | ICD-10-CM | POA: Diagnosis not present

## 2023-12-04 DIAGNOSIS — Z992 Dependence on renal dialysis: Secondary | ICD-10-CM | POA: Diagnosis not present

## 2023-12-04 DIAGNOSIS — D509 Iron deficiency anemia, unspecified: Secondary | ICD-10-CM | POA: Diagnosis not present

## 2023-12-04 DIAGNOSIS — E1129 Type 2 diabetes mellitus with other diabetic kidney complication: Secondary | ICD-10-CM | POA: Diagnosis not present

## 2023-12-05 ENCOUNTER — Telehealth: Payer: Self-pay

## 2023-12-05 DIAGNOSIS — E1122 Type 2 diabetes mellitus with diabetic chronic kidney disease: Secondary | ICD-10-CM | POA: Diagnosis not present

## 2023-12-05 DIAGNOSIS — F02818 Dementia in other diseases classified elsewhere, unspecified severity, with other behavioral disturbance: Secondary | ICD-10-CM | POA: Diagnosis not present

## 2023-12-05 DIAGNOSIS — I509 Heart failure, unspecified: Secondary | ICD-10-CM | POA: Diagnosis not present

## 2023-12-05 DIAGNOSIS — I420 Dilated cardiomyopathy: Secondary | ICD-10-CM | POA: Diagnosis not present

## 2023-12-05 DIAGNOSIS — F331 Major depressive disorder, recurrent, moderate: Secondary | ICD-10-CM | POA: Diagnosis not present

## 2023-12-05 DIAGNOSIS — N186 End stage renal disease: Secondary | ICD-10-CM | POA: Diagnosis not present

## 2023-12-05 DIAGNOSIS — F411 Generalized anxiety disorder: Secondary | ICD-10-CM | POA: Diagnosis not present

## 2023-12-05 DIAGNOSIS — F6381 Intermittent explosive disorder: Secondary | ICD-10-CM | POA: Diagnosis not present

## 2023-12-05 DIAGNOSIS — I132 Hypertensive heart and chronic kidney disease with heart failure and with stage 5 chronic kidney disease, or end stage renal disease: Secondary | ICD-10-CM | POA: Diagnosis not present

## 2023-12-05 DIAGNOSIS — D631 Anemia in chronic kidney disease: Secondary | ICD-10-CM | POA: Diagnosis not present

## 2023-12-05 NOTE — Telephone Encounter (Addendum)
 Triage:   -called to report entire arm swelling after placing a dialysis graft that the swelling is not better since surgery.   -LM to return our call

## 2023-12-06 DIAGNOSIS — D631 Anemia in chronic kidney disease: Secondary | ICD-10-CM | POA: Diagnosis not present

## 2023-12-06 DIAGNOSIS — I420 Dilated cardiomyopathy: Secondary | ICD-10-CM | POA: Diagnosis not present

## 2023-12-06 DIAGNOSIS — N186 End stage renal disease: Secondary | ICD-10-CM | POA: Diagnosis not present

## 2023-12-06 DIAGNOSIS — I132 Hypertensive heart and chronic kidney disease with heart failure and with stage 5 chronic kidney disease, or end stage renal disease: Secondary | ICD-10-CM | POA: Diagnosis not present

## 2023-12-06 DIAGNOSIS — N2581 Secondary hyperparathyroidism of renal origin: Secondary | ICD-10-CM | POA: Diagnosis not present

## 2023-12-06 DIAGNOSIS — E1122 Type 2 diabetes mellitus with diabetic chronic kidney disease: Secondary | ICD-10-CM | POA: Diagnosis not present

## 2023-12-06 DIAGNOSIS — D509 Iron deficiency anemia, unspecified: Secondary | ICD-10-CM | POA: Diagnosis not present

## 2023-12-06 DIAGNOSIS — E1129 Type 2 diabetes mellitus with other diabetic kidney complication: Secondary | ICD-10-CM | POA: Diagnosis not present

## 2023-12-06 DIAGNOSIS — I509 Heart failure, unspecified: Secondary | ICD-10-CM | POA: Diagnosis not present

## 2023-12-06 DIAGNOSIS — Z992 Dependence on renal dialysis: Secondary | ICD-10-CM | POA: Diagnosis not present

## 2023-12-09 DIAGNOSIS — E1129 Type 2 diabetes mellitus with other diabetic kidney complication: Secondary | ICD-10-CM | POA: Diagnosis not present

## 2023-12-09 DIAGNOSIS — D631 Anemia in chronic kidney disease: Secondary | ICD-10-CM | POA: Diagnosis not present

## 2023-12-09 DIAGNOSIS — N2581 Secondary hyperparathyroidism of renal origin: Secondary | ICD-10-CM | POA: Diagnosis not present

## 2023-12-09 DIAGNOSIS — N186 End stage renal disease: Secondary | ICD-10-CM | POA: Diagnosis not present

## 2023-12-09 DIAGNOSIS — Z992 Dependence on renal dialysis: Secondary | ICD-10-CM | POA: Diagnosis not present

## 2023-12-09 DIAGNOSIS — D509 Iron deficiency anemia, unspecified: Secondary | ICD-10-CM | POA: Diagnosis not present

## 2023-12-10 ENCOUNTER — Telehealth: Payer: Self-pay

## 2023-12-10 DIAGNOSIS — E1122 Type 2 diabetes mellitus with diabetic chronic kidney disease: Secondary | ICD-10-CM | POA: Diagnosis not present

## 2023-12-10 DIAGNOSIS — I509 Heart failure, unspecified: Secondary | ICD-10-CM | POA: Diagnosis not present

## 2023-12-10 DIAGNOSIS — I132 Hypertensive heart and chronic kidney disease with heart failure and with stage 5 chronic kidney disease, or end stage renal disease: Secondary | ICD-10-CM | POA: Diagnosis not present

## 2023-12-10 DIAGNOSIS — D631 Anemia in chronic kidney disease: Secondary | ICD-10-CM | POA: Diagnosis not present

## 2023-12-10 DIAGNOSIS — I420 Dilated cardiomyopathy: Secondary | ICD-10-CM | POA: Diagnosis not present

## 2023-12-10 DIAGNOSIS — N186 End stage renal disease: Secondary | ICD-10-CM | POA: Diagnosis not present

## 2023-12-10 NOTE — Telephone Encounter (Signed)
 Triage: Edwina Gram, RN @ [facility] called wanting to report post surgical arm swelling -returned call and spoke to wife who states it is swollen from the end of the incision to the hand and she would not say it was cold but sometimes cool.  She states she has been trying to use the little thing from after the surgery.  -advised to elevate arm above the level of the heart as much as possible and exercise hand, wrist etc. -wife confirms understanding if the the swelling has not gone down by Thursday to call us  back or if cold hand presents, or signs/symptoms of infection, please call

## 2023-12-11 DIAGNOSIS — Z992 Dependence on renal dialysis: Secondary | ICD-10-CM | POA: Diagnosis not present

## 2023-12-11 DIAGNOSIS — N186 End stage renal disease: Secondary | ICD-10-CM | POA: Diagnosis not present

## 2023-12-11 DIAGNOSIS — E1129 Type 2 diabetes mellitus with other diabetic kidney complication: Secondary | ICD-10-CM | POA: Diagnosis not present

## 2023-12-11 DIAGNOSIS — D631 Anemia in chronic kidney disease: Secondary | ICD-10-CM | POA: Diagnosis not present

## 2023-12-11 DIAGNOSIS — D509 Iron deficiency anemia, unspecified: Secondary | ICD-10-CM | POA: Diagnosis not present

## 2023-12-11 DIAGNOSIS — N2581 Secondary hyperparathyroidism of renal origin: Secondary | ICD-10-CM | POA: Diagnosis not present

## 2023-12-12 DIAGNOSIS — N186 End stage renal disease: Secondary | ICD-10-CM | POA: Diagnosis not present

## 2023-12-12 DIAGNOSIS — D631 Anemia in chronic kidney disease: Secondary | ICD-10-CM | POA: Diagnosis not present

## 2023-12-12 DIAGNOSIS — I509 Heart failure, unspecified: Secondary | ICD-10-CM | POA: Diagnosis not present

## 2023-12-12 DIAGNOSIS — I132 Hypertensive heart and chronic kidney disease with heart failure and with stage 5 chronic kidney disease, or end stage renal disease: Secondary | ICD-10-CM | POA: Diagnosis not present

## 2023-12-12 DIAGNOSIS — I420 Dilated cardiomyopathy: Secondary | ICD-10-CM | POA: Diagnosis not present

## 2023-12-12 DIAGNOSIS — E1122 Type 2 diabetes mellitus with diabetic chronic kidney disease: Secondary | ICD-10-CM | POA: Diagnosis not present

## 2023-12-13 DIAGNOSIS — N186 End stage renal disease: Secondary | ICD-10-CM | POA: Diagnosis not present

## 2023-12-13 DIAGNOSIS — D509 Iron deficiency anemia, unspecified: Secondary | ICD-10-CM | POA: Diagnosis not present

## 2023-12-13 DIAGNOSIS — Z992 Dependence on renal dialysis: Secondary | ICD-10-CM | POA: Diagnosis not present

## 2023-12-13 DIAGNOSIS — N2581 Secondary hyperparathyroidism of renal origin: Secondary | ICD-10-CM | POA: Diagnosis not present

## 2023-12-13 DIAGNOSIS — D631 Anemia in chronic kidney disease: Secondary | ICD-10-CM | POA: Diagnosis not present

## 2023-12-13 DIAGNOSIS — E1129 Type 2 diabetes mellitus with other diabetic kidney complication: Secondary | ICD-10-CM | POA: Diagnosis not present

## 2023-12-17 DIAGNOSIS — I509 Heart failure, unspecified: Secondary | ICD-10-CM | POA: Diagnosis not present

## 2023-12-17 DIAGNOSIS — I420 Dilated cardiomyopathy: Secondary | ICD-10-CM | POA: Diagnosis not present

## 2023-12-17 DIAGNOSIS — E1122 Type 2 diabetes mellitus with diabetic chronic kidney disease: Secondary | ICD-10-CM | POA: Diagnosis not present

## 2023-12-17 DIAGNOSIS — I132 Hypertensive heart and chronic kidney disease with heart failure and with stage 5 chronic kidney disease, or end stage renal disease: Secondary | ICD-10-CM | POA: Diagnosis not present

## 2023-12-17 DIAGNOSIS — D631 Anemia in chronic kidney disease: Secondary | ICD-10-CM | POA: Diagnosis not present

## 2023-12-17 DIAGNOSIS — N186 End stage renal disease: Secondary | ICD-10-CM | POA: Diagnosis not present

## 2023-12-18 DIAGNOSIS — E1129 Type 2 diabetes mellitus with other diabetic kidney complication: Secondary | ICD-10-CM | POA: Diagnosis not present

## 2023-12-18 DIAGNOSIS — D631 Anemia in chronic kidney disease: Secondary | ICD-10-CM | POA: Diagnosis not present

## 2023-12-18 DIAGNOSIS — Z992 Dependence on renal dialysis: Secondary | ICD-10-CM | POA: Diagnosis not present

## 2023-12-18 DIAGNOSIS — D509 Iron deficiency anemia, unspecified: Secondary | ICD-10-CM | POA: Diagnosis not present

## 2023-12-18 DIAGNOSIS — N186 End stage renal disease: Secondary | ICD-10-CM | POA: Diagnosis not present

## 2023-12-18 DIAGNOSIS — N2581 Secondary hyperparathyroidism of renal origin: Secondary | ICD-10-CM | POA: Diagnosis not present

## 2023-12-19 DIAGNOSIS — N186 End stage renal disease: Secondary | ICD-10-CM | POA: Diagnosis not present

## 2023-12-19 DIAGNOSIS — E1122 Type 2 diabetes mellitus with diabetic chronic kidney disease: Secondary | ICD-10-CM | POA: Diagnosis not present

## 2023-12-19 DIAGNOSIS — I420 Dilated cardiomyopathy: Secondary | ICD-10-CM | POA: Diagnosis not present

## 2023-12-19 DIAGNOSIS — I132 Hypertensive heart and chronic kidney disease with heart failure and with stage 5 chronic kidney disease, or end stage renal disease: Secondary | ICD-10-CM | POA: Diagnosis not present

## 2023-12-19 DIAGNOSIS — D631 Anemia in chronic kidney disease: Secondary | ICD-10-CM | POA: Diagnosis not present

## 2023-12-19 DIAGNOSIS — I509 Heart failure, unspecified: Secondary | ICD-10-CM | POA: Diagnosis not present

## 2023-12-23 ENCOUNTER — Other Ambulatory Visit: Payer: Self-pay | Admitting: Student

## 2023-12-23 DIAGNOSIS — D509 Iron deficiency anemia, unspecified: Secondary | ICD-10-CM | POA: Diagnosis not present

## 2023-12-23 DIAGNOSIS — E1129 Type 2 diabetes mellitus with other diabetic kidney complication: Secondary | ICD-10-CM | POA: Diagnosis not present

## 2023-12-23 DIAGNOSIS — Z992 Dependence on renal dialysis: Secondary | ICD-10-CM | POA: Diagnosis not present

## 2023-12-23 DIAGNOSIS — N186 End stage renal disease: Secondary | ICD-10-CM | POA: Diagnosis not present

## 2023-12-23 DIAGNOSIS — N2581 Secondary hyperparathyroidism of renal origin: Secondary | ICD-10-CM | POA: Diagnosis not present

## 2023-12-23 DIAGNOSIS — D631 Anemia in chronic kidney disease: Secondary | ICD-10-CM | POA: Diagnosis not present

## 2023-12-23 NOTE — Telephone Encounter (Signed)
 NOT [facility] PATIENT

## 2023-12-24 DIAGNOSIS — I420 Dilated cardiomyopathy: Secondary | ICD-10-CM | POA: Diagnosis not present

## 2023-12-24 DIAGNOSIS — I132 Hypertensive heart and chronic kidney disease with heart failure and with stage 5 chronic kidney disease, or end stage renal disease: Secondary | ICD-10-CM | POA: Diagnosis not present

## 2023-12-24 DIAGNOSIS — E1122 Type 2 diabetes mellitus with diabetic chronic kidney disease: Secondary | ICD-10-CM | POA: Diagnosis not present

## 2023-12-24 DIAGNOSIS — N186 End stage renal disease: Secondary | ICD-10-CM | POA: Diagnosis not present

## 2023-12-24 DIAGNOSIS — I509 Heart failure, unspecified: Secondary | ICD-10-CM | POA: Diagnosis not present

## 2023-12-24 DIAGNOSIS — D631 Anemia in chronic kidney disease: Secondary | ICD-10-CM | POA: Diagnosis not present

## 2023-12-25 DIAGNOSIS — N2581 Secondary hyperparathyroidism of renal origin: Secondary | ICD-10-CM | POA: Diagnosis not present

## 2023-12-25 DIAGNOSIS — I509 Heart failure, unspecified: Secondary | ICD-10-CM | POA: Diagnosis not present

## 2023-12-25 DIAGNOSIS — D509 Iron deficiency anemia, unspecified: Secondary | ICD-10-CM | POA: Diagnosis not present

## 2023-12-25 DIAGNOSIS — D631 Anemia in chronic kidney disease: Secondary | ICD-10-CM | POA: Diagnosis not present

## 2023-12-25 DIAGNOSIS — E1129 Type 2 diabetes mellitus with other diabetic kidney complication: Secondary | ICD-10-CM | POA: Diagnosis not present

## 2023-12-25 DIAGNOSIS — Z992 Dependence on renal dialysis: Secondary | ICD-10-CM | POA: Diagnosis not present

## 2023-12-25 DIAGNOSIS — N186 End stage renal disease: Secondary | ICD-10-CM | POA: Diagnosis not present

## 2023-12-25 DIAGNOSIS — I132 Hypertensive heart and chronic kidney disease with heart failure and with stage 5 chronic kidney disease, or end stage renal disease: Secondary | ICD-10-CM | POA: Diagnosis not present

## 2023-12-25 DIAGNOSIS — I420 Dilated cardiomyopathy: Secondary | ICD-10-CM | POA: Diagnosis not present

## 2023-12-25 DIAGNOSIS — E1122 Type 2 diabetes mellitus with diabetic chronic kidney disease: Secondary | ICD-10-CM | POA: Diagnosis not present

## 2023-12-26 ENCOUNTER — Telehealth: Payer: Self-pay

## 2023-12-26 DIAGNOSIS — T8620 Unspecified complication of heart transplant: Secondary | ICD-10-CM | POA: Diagnosis not present

## 2023-12-26 DIAGNOSIS — I420 Dilated cardiomyopathy: Secondary | ICD-10-CM | POA: Diagnosis not present

## 2023-12-26 DIAGNOSIS — E1129 Type 2 diabetes mellitus with other diabetic kidney complication: Secondary | ICD-10-CM | POA: Diagnosis not present

## 2023-12-26 DIAGNOSIS — N2581 Secondary hyperparathyroidism of renal origin: Secondary | ICD-10-CM | POA: Diagnosis not present

## 2023-12-26 DIAGNOSIS — D509 Iron deficiency anemia, unspecified: Secondary | ICD-10-CM | POA: Diagnosis not present

## 2023-12-26 DIAGNOSIS — Z992 Dependence on renal dialysis: Secondary | ICD-10-CM | POA: Diagnosis not present

## 2023-12-26 DIAGNOSIS — I132 Hypertensive heart and chronic kidney disease with heart failure and with stage 5 chronic kidney disease, or end stage renal disease: Secondary | ICD-10-CM | POA: Diagnosis not present

## 2023-12-26 DIAGNOSIS — I509 Heart failure, unspecified: Secondary | ICD-10-CM | POA: Diagnosis not present

## 2023-12-26 DIAGNOSIS — N186 End stage renal disease: Secondary | ICD-10-CM | POA: Diagnosis not present

## 2023-12-26 DIAGNOSIS — E1122 Type 2 diabetes mellitus with diabetic chronic kidney disease: Secondary | ICD-10-CM | POA: Diagnosis not present

## 2023-12-26 DIAGNOSIS — D631 Anemia in chronic kidney disease: Secondary | ICD-10-CM | POA: Diagnosis not present

## 2023-12-26 NOTE — Telephone Encounter (Signed)
 Pt's hospice nurse called to let us  know pt's still having ongoing swelling since fistula surgery. He has post op f/u with dialysis duplex scheduled on 2 weeks. I have offered them a same day appt. Pt's wife is unable to get him here today or tomorrow, due to transportation and HD. They are going to keep his scheduled appts and if it worsens they will report to ED and let us  know.

## 2023-12-27 DIAGNOSIS — N2581 Secondary hyperparathyroidism of renal origin: Secondary | ICD-10-CM | POA: Diagnosis not present

## 2023-12-27 DIAGNOSIS — Z992 Dependence on renal dialysis: Secondary | ICD-10-CM | POA: Diagnosis not present

## 2023-12-27 DIAGNOSIS — D631 Anemia in chronic kidney disease: Secondary | ICD-10-CM | POA: Diagnosis not present

## 2023-12-27 DIAGNOSIS — E1129 Type 2 diabetes mellitus with other diabetic kidney complication: Secondary | ICD-10-CM | POA: Diagnosis not present

## 2023-12-27 DIAGNOSIS — D509 Iron deficiency anemia, unspecified: Secondary | ICD-10-CM | POA: Diagnosis not present

## 2023-12-27 DIAGNOSIS — N186 End stage renal disease: Secondary | ICD-10-CM | POA: Diagnosis not present

## 2023-12-30 ENCOUNTER — Other Ambulatory Visit: Payer: Self-pay

## 2023-12-30 DIAGNOSIS — D631 Anemia in chronic kidney disease: Secondary | ICD-10-CM | POA: Diagnosis not present

## 2023-12-30 DIAGNOSIS — G20C Parkinsonism, unspecified: Secondary | ICD-10-CM | POA: Diagnosis not present

## 2023-12-30 DIAGNOSIS — F32A Depression, unspecified: Secondary | ICD-10-CM | POA: Diagnosis not present

## 2023-12-30 DIAGNOSIS — I4891 Unspecified atrial fibrillation: Secondary | ICD-10-CM | POA: Diagnosis not present

## 2023-12-30 DIAGNOSIS — I132 Hypertensive heart and chronic kidney disease with heart failure and with stage 5 chronic kidney disease, or end stage renal disease: Secondary | ICD-10-CM | POA: Diagnosis not present

## 2023-12-30 DIAGNOSIS — G894 Chronic pain syndrome: Secondary | ICD-10-CM | POA: Diagnosis not present

## 2023-12-30 DIAGNOSIS — D509 Iron deficiency anemia, unspecified: Secondary | ICD-10-CM | POA: Diagnosis not present

## 2023-12-30 DIAGNOSIS — E1122 Type 2 diabetes mellitus with diabetic chronic kidney disease: Secondary | ICD-10-CM | POA: Diagnosis not present

## 2023-12-30 DIAGNOSIS — Z992 Dependence on renal dialysis: Secondary | ICD-10-CM | POA: Diagnosis not present

## 2023-12-30 DIAGNOSIS — Z7901 Long term (current) use of anticoagulants: Secondary | ICD-10-CM | POA: Diagnosis not present

## 2023-12-30 DIAGNOSIS — I951 Orthostatic hypotension: Secondary | ICD-10-CM | POA: Diagnosis not present

## 2023-12-30 DIAGNOSIS — I493 Ventricular premature depolarization: Secondary | ICD-10-CM | POA: Diagnosis not present

## 2023-12-30 DIAGNOSIS — F02818 Dementia in other diseases classified elsewhere, unspecified severity, with other behavioral disturbance: Secondary | ICD-10-CM | POA: Diagnosis not present

## 2023-12-30 DIAGNOSIS — N2581 Secondary hyperparathyroidism of renal origin: Secondary | ICD-10-CM | POA: Diagnosis not present

## 2023-12-30 DIAGNOSIS — N186 End stage renal disease: Secondary | ICD-10-CM

## 2023-12-30 DIAGNOSIS — E1129 Type 2 diabetes mellitus with other diabetic kidney complication: Secondary | ICD-10-CM | POA: Diagnosis not present

## 2023-12-30 DIAGNOSIS — I252 Old myocardial infarction: Secondary | ICD-10-CM | POA: Diagnosis not present

## 2023-12-30 DIAGNOSIS — E1142 Type 2 diabetes mellitus with diabetic polyneuropathy: Secondary | ICD-10-CM | POA: Diagnosis not present

## 2023-12-30 DIAGNOSIS — Z6828 Body mass index (BMI) 28.0-28.9, adult: Secondary | ICD-10-CM | POA: Diagnosis not present

## 2023-12-30 DIAGNOSIS — I447 Left bundle-branch block, unspecified: Secondary | ICD-10-CM | POA: Diagnosis not present

## 2023-12-30 DIAGNOSIS — G4733 Obstructive sleep apnea (adult) (pediatric): Secondary | ICD-10-CM | POA: Diagnosis not present

## 2023-12-30 DIAGNOSIS — I509 Heart failure, unspecified: Secondary | ICD-10-CM | POA: Diagnosis not present

## 2023-12-30 DIAGNOSIS — I420 Dilated cardiomyopathy: Secondary | ICD-10-CM | POA: Diagnosis not present

## 2023-12-30 DIAGNOSIS — E7800 Pure hypercholesterolemia, unspecified: Secondary | ICD-10-CM | POA: Diagnosis not present

## 2023-12-30 DIAGNOSIS — F0283 Dementia in other diseases classified elsewhere, unspecified severity, with mood disturbance: Secondary | ICD-10-CM | POA: Diagnosis not present

## 2023-12-30 DIAGNOSIS — M1990 Unspecified osteoarthritis, unspecified site: Secondary | ICD-10-CM | POA: Diagnosis not present

## 2023-12-30 DIAGNOSIS — I2510 Atherosclerotic heart disease of native coronary artery without angina pectoris: Secondary | ICD-10-CM | POA: Diagnosis not present

## 2023-12-31 DIAGNOSIS — I509 Heart failure, unspecified: Secondary | ICD-10-CM | POA: Diagnosis not present

## 2023-12-31 DIAGNOSIS — D631 Anemia in chronic kidney disease: Secondary | ICD-10-CM | POA: Diagnosis not present

## 2023-12-31 DIAGNOSIS — N186 End stage renal disease: Secondary | ICD-10-CM | POA: Diagnosis not present

## 2023-12-31 DIAGNOSIS — I132 Hypertensive heart and chronic kidney disease with heart failure and with stage 5 chronic kidney disease, or end stage renal disease: Secondary | ICD-10-CM | POA: Diagnosis not present

## 2023-12-31 DIAGNOSIS — E1122 Type 2 diabetes mellitus with diabetic chronic kidney disease: Secondary | ICD-10-CM | POA: Diagnosis not present

## 2023-12-31 DIAGNOSIS — I420 Dilated cardiomyopathy: Secondary | ICD-10-CM | POA: Diagnosis not present

## 2024-01-01 DIAGNOSIS — I420 Dilated cardiomyopathy: Secondary | ICD-10-CM | POA: Diagnosis not present

## 2024-01-01 DIAGNOSIS — D631 Anemia in chronic kidney disease: Secondary | ICD-10-CM | POA: Diagnosis not present

## 2024-01-01 DIAGNOSIS — Z992 Dependence on renal dialysis: Secondary | ICD-10-CM | POA: Diagnosis not present

## 2024-01-01 DIAGNOSIS — D509 Iron deficiency anemia, unspecified: Secondary | ICD-10-CM | POA: Diagnosis not present

## 2024-01-01 DIAGNOSIS — N2581 Secondary hyperparathyroidism of renal origin: Secondary | ICD-10-CM | POA: Diagnosis not present

## 2024-01-01 DIAGNOSIS — I132 Hypertensive heart and chronic kidney disease with heart failure and with stage 5 chronic kidney disease, or end stage renal disease: Secondary | ICD-10-CM | POA: Diagnosis not present

## 2024-01-01 DIAGNOSIS — E1129 Type 2 diabetes mellitus with other diabetic kidney complication: Secondary | ICD-10-CM | POA: Diagnosis not present

## 2024-01-01 DIAGNOSIS — E1122 Type 2 diabetes mellitus with diabetic chronic kidney disease: Secondary | ICD-10-CM | POA: Diagnosis not present

## 2024-01-01 DIAGNOSIS — I509 Heart failure, unspecified: Secondary | ICD-10-CM | POA: Diagnosis not present

## 2024-01-01 DIAGNOSIS — N186 End stage renal disease: Secondary | ICD-10-CM | POA: Diagnosis not present

## 2024-01-02 DIAGNOSIS — I132 Hypertensive heart and chronic kidney disease with heart failure and with stage 5 chronic kidney disease, or end stage renal disease: Secondary | ICD-10-CM | POA: Diagnosis not present

## 2024-01-02 DIAGNOSIS — I420 Dilated cardiomyopathy: Secondary | ICD-10-CM | POA: Diagnosis not present

## 2024-01-02 DIAGNOSIS — D631 Anemia in chronic kidney disease: Secondary | ICD-10-CM | POA: Diagnosis not present

## 2024-01-02 DIAGNOSIS — E1122 Type 2 diabetes mellitus with diabetic chronic kidney disease: Secondary | ICD-10-CM | POA: Diagnosis not present

## 2024-01-02 DIAGNOSIS — I509 Heart failure, unspecified: Secondary | ICD-10-CM | POA: Diagnosis not present

## 2024-01-02 DIAGNOSIS — N186 End stage renal disease: Secondary | ICD-10-CM | POA: Diagnosis not present

## 2024-01-03 DIAGNOSIS — Z992 Dependence on renal dialysis: Secondary | ICD-10-CM | POA: Diagnosis not present

## 2024-01-03 DIAGNOSIS — E1129 Type 2 diabetes mellitus with other diabetic kidney complication: Secondary | ICD-10-CM | POA: Diagnosis not present

## 2024-01-03 DIAGNOSIS — N2581 Secondary hyperparathyroidism of renal origin: Secondary | ICD-10-CM | POA: Diagnosis not present

## 2024-01-03 DIAGNOSIS — N186 End stage renal disease: Secondary | ICD-10-CM | POA: Diagnosis not present

## 2024-01-03 DIAGNOSIS — D631 Anemia in chronic kidney disease: Secondary | ICD-10-CM | POA: Diagnosis not present

## 2024-01-03 DIAGNOSIS — D509 Iron deficiency anemia, unspecified: Secondary | ICD-10-CM | POA: Diagnosis not present

## 2024-01-06 ENCOUNTER — Other Ambulatory Visit (HOSPITAL_COMMUNITY): Payer: Self-pay

## 2024-01-06 DIAGNOSIS — Z992 Dependence on renal dialysis: Secondary | ICD-10-CM | POA: Diagnosis not present

## 2024-01-06 DIAGNOSIS — D631 Anemia in chronic kidney disease: Secondary | ICD-10-CM | POA: Diagnosis not present

## 2024-01-06 DIAGNOSIS — N186 End stage renal disease: Secondary | ICD-10-CM | POA: Diagnosis not present

## 2024-01-06 DIAGNOSIS — D509 Iron deficiency anemia, unspecified: Secondary | ICD-10-CM | POA: Diagnosis not present

## 2024-01-06 DIAGNOSIS — N2581 Secondary hyperparathyroidism of renal origin: Secondary | ICD-10-CM | POA: Diagnosis not present

## 2024-01-06 DIAGNOSIS — E1129 Type 2 diabetes mellitus with other diabetic kidney complication: Secondary | ICD-10-CM | POA: Diagnosis not present

## 2024-01-07 DIAGNOSIS — I4891 Unspecified atrial fibrillation: Secondary | ICD-10-CM | POA: Diagnosis not present

## 2024-01-07 DIAGNOSIS — I110 Hypertensive heart disease with heart failure: Secondary | ICD-10-CM | POA: Diagnosis not present

## 2024-01-07 DIAGNOSIS — I420 Dilated cardiomyopathy: Secondary | ICD-10-CM | POA: Diagnosis not present

## 2024-01-07 DIAGNOSIS — E1122 Type 2 diabetes mellitus with diabetic chronic kidney disease: Secondary | ICD-10-CM | POA: Diagnosis not present

## 2024-01-07 DIAGNOSIS — E119 Type 2 diabetes mellitus without complications: Secondary | ICD-10-CM | POA: Diagnosis not present

## 2024-01-07 DIAGNOSIS — N186 End stage renal disease: Secondary | ICD-10-CM | POA: Diagnosis not present

## 2024-01-07 DIAGNOSIS — D631 Anemia in chronic kidney disease: Secondary | ICD-10-CM | POA: Diagnosis not present

## 2024-01-07 DIAGNOSIS — I132 Hypertensive heart and chronic kidney disease with heart failure and with stage 5 chronic kidney disease, or end stage renal disease: Secondary | ICD-10-CM | POA: Diagnosis not present

## 2024-01-07 DIAGNOSIS — I509 Heart failure, unspecified: Secondary | ICD-10-CM | POA: Diagnosis not present

## 2024-01-08 ENCOUNTER — Ambulatory Visit: Attending: Vascular Surgery | Admitting: Physician Assistant

## 2024-01-08 ENCOUNTER — Ambulatory Visit (HOSPITAL_COMMUNITY)
Admission: RE | Admit: 2024-01-08 | Discharge: 2024-01-08 | Disposition: A | Discharge status: Home / Self Care | Source: Ambulatory Visit | Attending: Vascular Surgery | Admitting: Vascular Surgery

## 2024-01-08 ENCOUNTER — Other Ambulatory Visit: Payer: Self-pay

## 2024-01-08 VITALS — BP 116/70 | HR 86 | Temp 98.3°F | Ht 79.0 in | Wt 250.0 lb

## 2024-01-08 DIAGNOSIS — N186 End stage renal disease: Secondary | ICD-10-CM

## 2024-01-08 DIAGNOSIS — R600 Localized edema: Secondary | ICD-10-CM

## 2024-01-08 NOTE — H&P (View-Only) (Signed)
 Postoperative Access Visit   History of Present Illness   [name] is a 63 y.o. year old male who presents for postoperative follow-up for: right brachial cephalic AV fistula creation by Dr. Vikki Graves on 11/19/23. The patient's wounds are well healed.  The patient notes  some steal symptoms- pain and tenderness in forearm. Arm is very swollen and apparently has been since surgery. Daughter and wife in room and explain that it was much worse but it has not completely resolved. Trying to get him to elevate his arm. Minimal improvement so far. Also complaining of coldness in right hand. However stays cold in general.  He currently is dialyzing on MWF  via right internal jugular TDC  Physical Examination   Vitals:   01/08/24 1030  BP: 116/70  Pulse: 86  Temp: 98.3 F (36.8 C)  SpO2: 97%     right arm Incision is well healed, 2+ radial pulse, hand grip is 2/5, sensation in digits is intact, palpable thrill, bruit can be auscultated. RUE is extremely edematous mostly from mid upper arm down to right hand. Has some visible veins on right upper chest wall and into right shoulder     Non invasive vascular lab:  Findings:  +--------------------+----------+-----------------+--------+  AVF                PSV (cm/s)Flow Vol (mL/min)Comments  +--------------------+----------+-----------------+--------+  Native artery inflow   134          1208                 +--------------------+----------+-----------------+--------+  AVF Anastomosis        288                               +--------------------+----------+-----------------+--------+     +------------+----------+-------------+----------+-------------------+  OUTFLOW VEINPSV (cm/s)Diameter (cm)Depth (cm)     Describe        +------------+----------+-------------+----------+-------------------+  Prox UA        141        0.93        0.52                         +------------+----------+-------------+----------+-------------------+  Mid UA         160        0.88        0.20                        +------------+----------+-------------+----------+-------------------+  Dist UA        189        1.26        0.20   partially-occlusive  +------------+----------+-------------+----------+-------------------+  AC Fossa       322        1.75        0.31                        +------------+----------+-------------+----------+-------------------+      Medical Decision Making   [name] is a 63 y.o. year old male who presents s/p right brachial cephalic AV fistula creation by Dr. Vikki Graves on 11/19/23. The patient's wounds are well healed. Fistula is patent with good volume flow, adequate depth and maturation. The patient notes some steal symptoms- pain and tenderness in forearm. Arm is very swollen and apparently has been since surgery. He is having pain,  coldness and tenderness in the right arm and forearm especially. Little improvement with elevation. I have recommended Fistulogram for further evaluation. I suspect some central venous stenosis/occlusion. He dialyzes on MWF via rigth internal jugular TDC.  He is on Eliquis  which will need to be held Will arrange Fistulogram in the near future with Dr. Vikki Graves. Will try to arrange this on a non dialysis day   Deneen Finical, PA-C [facility] [place] Office: [phone]  Clinic MD: Vikki Graves

## 2024-01-08 NOTE — Progress Notes (Signed)
 Postoperative Access Visit   History of Present Illness   [name] is a 63 y.o. year old male who presents for postoperative follow-up for: right brachial cephalic AV fistula creation by Dr. Vikki Graves on 11/19/23. The patient's wounds are well healed.  The patient notes  some steal symptoms- pain and tenderness in forearm. Arm is very swollen and apparently has been since surgery. Daughter and wife in room and explain that it was much worse but it has not completely resolved. Trying to get him to elevate his arm. Minimal improvement so far. Also complaining of coldness in right hand. However stays cold in general.  He currently is dialyzing on MWF  via right internal jugular TDC  Physical Examination   Vitals:   01/08/24 1030  BP: 116/70  Pulse: 86  Temp: 98.3 F (36.8 C)  SpO2: 97%     right arm Incision is well healed, 2+ radial pulse, hand grip is 2/5, sensation in digits is intact, palpable thrill, bruit can be auscultated. RUE is extremely edematous mostly from mid upper arm down to right hand. Has some visible veins on right upper chest wall and into right shoulder     Non invasive vascular lab:  Findings:  +--------------------+----------+-----------------+--------+  AVF                PSV (cm/s)Flow Vol (mL/min)Comments  +--------------------+----------+-----------------+--------+  Native artery inflow   134          1208                 +--------------------+----------+-----------------+--------+  AVF Anastomosis        288                               +--------------------+----------+-----------------+--------+     +------------+----------+-------------+----------+-------------------+  OUTFLOW VEINPSV (cm/s)Diameter (cm)Depth (cm)     Describe        +------------+----------+-------------+----------+-------------------+  Prox UA        141        0.93        0.52                         +------------+----------+-------------+----------+-------------------+  Mid UA         160        0.88        0.20                        +------------+----------+-------------+----------+-------------------+  Dist UA        189        1.26        0.20   partially-occlusive  +------------+----------+-------------+----------+-------------------+  AC Fossa       322        1.75        0.31                        +------------+----------+-------------+----------+-------------------+      Medical Decision Making   [name] is a 63 y.o. year old male who presents s/p right brachial cephalic AV fistula creation by Dr. Vikki Graves on 11/19/23. The patient's wounds are well healed. Fistula is patent with good volume flow, adequate depth and maturation. The patient notes some steal symptoms- pain and tenderness in forearm. Arm is very swollen and apparently has been since surgery. He is having pain,  coldness and tenderness in the right arm and forearm especially. Little improvement with elevation. I have recommended Fistulogram for further evaluation. I suspect some central venous stenosis/occlusion. He dialyzes on MWF via rigth internal jugular TDC.  He is on Eliquis  which will need to be held Will arrange Fistulogram in the near future with Dr. Vikki Graves. Will try to arrange this on a non dialysis day   Deneen Finical, PA-C [facility] [place] Office: [phone]  Clinic MD: Vikki Graves

## 2024-01-09 DIAGNOSIS — N186 End stage renal disease: Secondary | ICD-10-CM | POA: Diagnosis not present

## 2024-01-09 DIAGNOSIS — D509 Iron deficiency anemia, unspecified: Secondary | ICD-10-CM | POA: Diagnosis not present

## 2024-01-09 DIAGNOSIS — Z992 Dependence on renal dialysis: Secondary | ICD-10-CM | POA: Diagnosis not present

## 2024-01-09 DIAGNOSIS — D631 Anemia in chronic kidney disease: Secondary | ICD-10-CM | POA: Diagnosis not present

## 2024-01-09 DIAGNOSIS — N2581 Secondary hyperparathyroidism of renal origin: Secondary | ICD-10-CM | POA: Diagnosis not present

## 2024-01-09 DIAGNOSIS — E1129 Type 2 diabetes mellitus with other diabetic kidney complication: Secondary | ICD-10-CM | POA: Diagnosis not present

## 2024-01-10 ENCOUNTER — Telehealth: Payer: Self-pay

## 2024-01-10 DIAGNOSIS — I420 Dilated cardiomyopathy: Secondary | ICD-10-CM | POA: Diagnosis not present

## 2024-01-10 DIAGNOSIS — N186 End stage renal disease: Secondary | ICD-10-CM | POA: Diagnosis not present

## 2024-01-10 DIAGNOSIS — D509 Iron deficiency anemia, unspecified: Secondary | ICD-10-CM | POA: Diagnosis not present

## 2024-01-10 DIAGNOSIS — N2581 Secondary hyperparathyroidism of renal origin: Secondary | ICD-10-CM | POA: Diagnosis not present

## 2024-01-10 DIAGNOSIS — E1122 Type 2 diabetes mellitus with diabetic chronic kidney disease: Secondary | ICD-10-CM | POA: Diagnosis not present

## 2024-01-10 DIAGNOSIS — F02C0 Dementia in other diseases classified elsewhere, severe, without behavioral disturbance, psychotic disturbance, mood disturbance, and anxiety: Secondary | ICD-10-CM | POA: Diagnosis not present

## 2024-01-10 DIAGNOSIS — E1129 Type 2 diabetes mellitus with other diabetic kidney complication: Secondary | ICD-10-CM | POA: Diagnosis not present

## 2024-01-10 DIAGNOSIS — D631 Anemia in chronic kidney disease: Secondary | ICD-10-CM | POA: Diagnosis not present

## 2024-01-10 DIAGNOSIS — I509 Heart failure, unspecified: Secondary | ICD-10-CM | POA: Diagnosis not present

## 2024-01-10 DIAGNOSIS — Z992 Dependence on renal dialysis: Secondary | ICD-10-CM | POA: Diagnosis not present

## 2024-01-10 DIAGNOSIS — I132 Hypertensive heart and chronic kidney disease with heart failure and with stage 5 chronic kidney disease, or end stage renal disease: Secondary | ICD-10-CM | POA: Diagnosis not present

## 2024-01-10 NOTE — Telephone Encounter (Signed)
 Called pt regarding his MyChart message. He and his wife were on the call. She stated he just got home from HD and his arm was more swollen after HD, which it has been doing. Advised pt to elevate his arm above heart level. He states the swelling is unchanged for the most part. I moved up his fistulogram a week.  They are aware to call us  if they have any questions/concerns.

## 2024-01-13 DIAGNOSIS — E1129 Type 2 diabetes mellitus with other diabetic kidney complication: Secondary | ICD-10-CM | POA: Diagnosis not present

## 2024-01-13 DIAGNOSIS — N2581 Secondary hyperparathyroidism of renal origin: Secondary | ICD-10-CM | POA: Diagnosis not present

## 2024-01-13 DIAGNOSIS — Z992 Dependence on renal dialysis: Secondary | ICD-10-CM | POA: Diagnosis not present

## 2024-01-13 DIAGNOSIS — D509 Iron deficiency anemia, unspecified: Secondary | ICD-10-CM | POA: Diagnosis not present

## 2024-01-13 DIAGNOSIS — D631 Anemia in chronic kidney disease: Secondary | ICD-10-CM | POA: Diagnosis not present

## 2024-01-13 DIAGNOSIS — N186 End stage renal disease: Secondary | ICD-10-CM | POA: Diagnosis not present

## 2024-01-14 DIAGNOSIS — I509 Heart failure, unspecified: Secondary | ICD-10-CM | POA: Diagnosis not present

## 2024-01-14 DIAGNOSIS — I420 Dilated cardiomyopathy: Secondary | ICD-10-CM | POA: Diagnosis not present

## 2024-01-14 DIAGNOSIS — I132 Hypertensive heart and chronic kidney disease with heart failure and with stage 5 chronic kidney disease, or end stage renal disease: Secondary | ICD-10-CM | POA: Diagnosis not present

## 2024-01-14 DIAGNOSIS — D631 Anemia in chronic kidney disease: Secondary | ICD-10-CM | POA: Diagnosis not present

## 2024-01-14 DIAGNOSIS — N186 End stage renal disease: Secondary | ICD-10-CM | POA: Diagnosis not present

## 2024-01-14 DIAGNOSIS — E1122 Type 2 diabetes mellitus with diabetic chronic kidney disease: Secondary | ICD-10-CM | POA: Diagnosis not present

## 2024-01-15 DIAGNOSIS — D631 Anemia in chronic kidney disease: Secondary | ICD-10-CM | POA: Diagnosis not present

## 2024-01-15 DIAGNOSIS — E1129 Type 2 diabetes mellitus with other diabetic kidney complication: Secondary | ICD-10-CM | POA: Diagnosis not present

## 2024-01-15 DIAGNOSIS — N186 End stage renal disease: Secondary | ICD-10-CM | POA: Diagnosis not present

## 2024-01-15 DIAGNOSIS — N2581 Secondary hyperparathyroidism of renal origin: Secondary | ICD-10-CM | POA: Diagnosis not present

## 2024-01-15 DIAGNOSIS — Z992 Dependence on renal dialysis: Secondary | ICD-10-CM | POA: Diagnosis not present

## 2024-01-15 DIAGNOSIS — D509 Iron deficiency anemia, unspecified: Secondary | ICD-10-CM | POA: Diagnosis not present

## 2024-01-16 DIAGNOSIS — I110 Hypertensive heart disease with heart failure: Secondary | ICD-10-CM | POA: Diagnosis not present

## 2024-01-16 DIAGNOSIS — F32A Depression, unspecified: Secondary | ICD-10-CM | POA: Diagnosis not present

## 2024-01-16 DIAGNOSIS — I509 Heart failure, unspecified: Secondary | ICD-10-CM | POA: Diagnosis not present

## 2024-01-16 DIAGNOSIS — D6869 Other thrombophilia: Secondary | ICD-10-CM | POA: Diagnosis not present

## 2024-01-16 DIAGNOSIS — I420 Dilated cardiomyopathy: Secondary | ICD-10-CM | POA: Diagnosis not present

## 2024-01-16 DIAGNOSIS — N186 End stage renal disease: Secondary | ICD-10-CM | POA: Diagnosis not present

## 2024-01-16 DIAGNOSIS — D631 Anemia in chronic kidney disease: Secondary | ICD-10-CM | POA: Diagnosis not present

## 2024-01-16 DIAGNOSIS — E1122 Type 2 diabetes mellitus with diabetic chronic kidney disease: Secondary | ICD-10-CM | POA: Diagnosis not present

## 2024-01-16 DIAGNOSIS — I5032 Chronic diastolic (congestive) heart failure: Secondary | ICD-10-CM | POA: Diagnosis not present

## 2024-01-16 DIAGNOSIS — E1140 Type 2 diabetes mellitus with diabetic neuropathy, unspecified: Secondary | ICD-10-CM | POA: Diagnosis not present

## 2024-01-16 DIAGNOSIS — I4891 Unspecified atrial fibrillation: Secondary | ICD-10-CM | POA: Diagnosis not present

## 2024-01-16 DIAGNOSIS — D849 Immunodeficiency, unspecified: Secondary | ICD-10-CM | POA: Diagnosis not present

## 2024-01-16 DIAGNOSIS — E1151 Type 2 diabetes mellitus with diabetic peripheral angiopathy without gangrene: Secondary | ICD-10-CM | POA: Diagnosis not present

## 2024-01-16 DIAGNOSIS — I132 Hypertensive heart and chronic kidney disease with heart failure and with stage 5 chronic kidney disease, or end stage renal disease: Secondary | ICD-10-CM | POA: Diagnosis not present

## 2024-01-17 DIAGNOSIS — N2581 Secondary hyperparathyroidism of renal origin: Secondary | ICD-10-CM | POA: Diagnosis not present

## 2024-01-17 DIAGNOSIS — D509 Iron deficiency anemia, unspecified: Secondary | ICD-10-CM | POA: Diagnosis not present

## 2024-01-17 DIAGNOSIS — Z992 Dependence on renal dialysis: Secondary | ICD-10-CM | POA: Diagnosis not present

## 2024-01-17 DIAGNOSIS — N186 End stage renal disease: Secondary | ICD-10-CM | POA: Diagnosis not present

## 2024-01-17 DIAGNOSIS — E1129 Type 2 diabetes mellitus with other diabetic kidney complication: Secondary | ICD-10-CM | POA: Diagnosis not present

## 2024-01-17 DIAGNOSIS — D631 Anemia in chronic kidney disease: Secondary | ICD-10-CM | POA: Diagnosis not present

## 2024-01-20 DIAGNOSIS — D509 Iron deficiency anemia, unspecified: Secondary | ICD-10-CM | POA: Diagnosis not present

## 2024-01-20 DIAGNOSIS — E1129 Type 2 diabetes mellitus with other diabetic kidney complication: Secondary | ICD-10-CM | POA: Diagnosis not present

## 2024-01-20 DIAGNOSIS — D631 Anemia in chronic kidney disease: Secondary | ICD-10-CM | POA: Diagnosis not present

## 2024-01-20 DIAGNOSIS — Z992 Dependence on renal dialysis: Secondary | ICD-10-CM | POA: Diagnosis not present

## 2024-01-20 DIAGNOSIS — N186 End stage renal disease: Secondary | ICD-10-CM | POA: Diagnosis not present

## 2024-01-20 DIAGNOSIS — N2581 Secondary hyperparathyroidism of renal origin: Secondary | ICD-10-CM | POA: Diagnosis not present

## 2024-01-22 DIAGNOSIS — N186 End stage renal disease: Secondary | ICD-10-CM | POA: Diagnosis not present

## 2024-01-22 DIAGNOSIS — I132 Hypertensive heart and chronic kidney disease with heart failure and with stage 5 chronic kidney disease, or end stage renal disease: Secondary | ICD-10-CM | POA: Diagnosis not present

## 2024-01-22 DIAGNOSIS — I509 Heart failure, unspecified: Secondary | ICD-10-CM | POA: Diagnosis not present

## 2024-01-22 DIAGNOSIS — I420 Dilated cardiomyopathy: Secondary | ICD-10-CM | POA: Diagnosis not present

## 2024-01-22 DIAGNOSIS — E1122 Type 2 diabetes mellitus with diabetic chronic kidney disease: Secondary | ICD-10-CM | POA: Diagnosis not present

## 2024-01-22 DIAGNOSIS — D631 Anemia in chronic kidney disease: Secondary | ICD-10-CM | POA: Diagnosis not present

## 2024-01-22 DIAGNOSIS — N2581 Secondary hyperparathyroidism of renal origin: Secondary | ICD-10-CM | POA: Diagnosis not present

## 2024-01-22 DIAGNOSIS — E1129 Type 2 diabetes mellitus with other diabetic kidney complication: Secondary | ICD-10-CM | POA: Diagnosis not present

## 2024-01-22 DIAGNOSIS — Z992 Dependence on renal dialysis: Secondary | ICD-10-CM | POA: Diagnosis not present

## 2024-01-22 DIAGNOSIS — D509 Iron deficiency anemia, unspecified: Secondary | ICD-10-CM | POA: Diagnosis not present

## 2024-01-23 DIAGNOSIS — D631 Anemia in chronic kidney disease: Secondary | ICD-10-CM | POA: Diagnosis not present

## 2024-01-23 DIAGNOSIS — N186 End stage renal disease: Secondary | ICD-10-CM | POA: Diagnosis not present

## 2024-01-23 DIAGNOSIS — I420 Dilated cardiomyopathy: Secondary | ICD-10-CM | POA: Diagnosis not present

## 2024-01-23 DIAGNOSIS — I509 Heart failure, unspecified: Secondary | ICD-10-CM | POA: Diagnosis not present

## 2024-01-23 DIAGNOSIS — E1122 Type 2 diabetes mellitus with diabetic chronic kidney disease: Secondary | ICD-10-CM | POA: Diagnosis not present

## 2024-01-23 DIAGNOSIS — I132 Hypertensive heart and chronic kidney disease with heart failure and with stage 5 chronic kidney disease, or end stage renal disease: Secondary | ICD-10-CM | POA: Diagnosis not present

## 2024-01-24 DIAGNOSIS — D631 Anemia in chronic kidney disease: Secondary | ICD-10-CM | POA: Diagnosis not present

## 2024-01-24 DIAGNOSIS — Z992 Dependence on renal dialysis: Secondary | ICD-10-CM | POA: Diagnosis not present

## 2024-01-24 DIAGNOSIS — N2581 Secondary hyperparathyroidism of renal origin: Secondary | ICD-10-CM | POA: Diagnosis not present

## 2024-01-24 DIAGNOSIS — D509 Iron deficiency anemia, unspecified: Secondary | ICD-10-CM | POA: Diagnosis not present

## 2024-01-24 DIAGNOSIS — E1129 Type 2 diabetes mellitus with other diabetic kidney complication: Secondary | ICD-10-CM | POA: Diagnosis not present

## 2024-01-24 DIAGNOSIS — N186 End stage renal disease: Secondary | ICD-10-CM | POA: Diagnosis not present

## 2024-01-25 DIAGNOSIS — F02C0 Dementia in other diseases classified elsewhere, severe, without behavioral disturbance, psychotic disturbance, mood disturbance, and anxiety: Secondary | ICD-10-CM | POA: Diagnosis not present

## 2024-01-26 DIAGNOSIS — F02C0 Dementia in other diseases classified elsewhere, severe, without behavioral disturbance, psychotic disturbance, mood disturbance, and anxiety: Secondary | ICD-10-CM | POA: Diagnosis not present

## 2024-01-26 DIAGNOSIS — Z992 Dependence on renal dialysis: Secondary | ICD-10-CM | POA: Diagnosis not present

## 2024-01-26 DIAGNOSIS — N186 End stage renal disease: Secondary | ICD-10-CM | POA: Diagnosis not present

## 2024-01-26 DIAGNOSIS — T8620 Unspecified complication of heart transplant: Secondary | ICD-10-CM | POA: Diagnosis not present

## 2024-01-27 ENCOUNTER — Ambulatory Visit (HOSPITAL_COMMUNITY)
Admission: RE | Admit: 2024-01-27 | Discharge: 2024-01-27 | Disposition: A | Discharge status: Home / Self Care | Attending: Vascular Surgery | Admitting: Vascular Surgery

## 2024-01-27 ENCOUNTER — Encounter (HOSPITAL_COMMUNITY)
Admission: RE | Disposition: A | Discharge status: Home / Self Care | Payer: Self-pay | Source: Home / Self Care | Attending: Vascular Surgery

## 2024-01-27 ENCOUNTER — Encounter (HOSPITAL_COMMUNITY): Payer: Self-pay | Admitting: Vascular Surgery

## 2024-01-27 DIAGNOSIS — Z7901 Long term (current) use of anticoagulants: Secondary | ICD-10-CM | POA: Insufficient documentation

## 2024-01-27 DIAGNOSIS — M79632 Pain in left forearm: Secondary | ICD-10-CM | POA: Insufficient documentation

## 2024-01-27 DIAGNOSIS — T82848A Pain from vascular prosthetic devices, implants and grafts, initial encounter: Secondary | ICD-10-CM

## 2024-01-27 DIAGNOSIS — N186 End stage renal disease: Secondary | ICD-10-CM

## 2024-01-27 DIAGNOSIS — Z992 Dependence on renal dialysis: Secondary | ICD-10-CM

## 2024-01-27 DIAGNOSIS — M7989 Other specified soft tissue disorders: Secondary | ICD-10-CM | POA: Insufficient documentation

## 2024-01-27 HISTORY — PX: A/V FISTULAGRAM: CATH118298

## 2024-01-27 LAB — GLUCOSE, CAPILLARY
Glucose-Capillary: 58 mg/dL — ABNORMAL LOW (ref 70–99)
Glucose-Capillary: 80 mg/dL (ref 70–99)
Glucose-Capillary: 87 mg/dL (ref 70–99)
Glucose-Capillary: 80 mg/dL (ref 70–99)

## 2024-01-27 LAB — POCT I-STAT, CHEM 8
BUN: 45 mg/dL — ABNORMAL HIGH (ref 8–23)
Calcium, Ion: 1.12 mmol/L — ABNORMAL LOW (ref 1.15–1.40)
Chloride: 98 mmol/L (ref 98–111)
Creatinine, Ser: 9.7 mg/dL — ABNORMAL HIGH (ref 0.61–1.24)
Glucose, Bld: 72 mg/dL (ref 70–99)
HCT: 28 % — ABNORMAL LOW (ref 39.0–52.0)
Hemoglobin: 9.5 g/dL — ABNORMAL LOW (ref 13.0–17.0)
Potassium: 5.2 mmol/L — ABNORMAL HIGH (ref 3.5–5.1)
Sodium: 136 mmol/L (ref 135–145)
TCO2: 28 mmol/L (ref 22–32)

## 2024-01-27 SURGERY — A/V FISTULAGRAM
Anesthesia: LOCAL | Laterality: Right

## 2024-01-27 MED ORDER — LIDOCAINE HCL (PF) 1 % IJ SOLN
INTRAMUSCULAR | Status: DC | PRN
Start: 2024-01-27 — End: 2024-01-28
  Administered 2024-01-27: 10 mL

## 2024-01-27 MED ORDER — IODIXANOL 320 MG/ML IV SOLN
INTRAVENOUS | Status: DC | PRN
  Administered 2024-01-27: 45 mL

## 2024-01-27 MED ORDER — SODIUM CHLORIDE 0.9% FLUSH: 3.0000 mL | INTRAVENOUS | Status: DC | PRN

## 2024-01-27 MED ORDER — DIPHENHYDRAMINE HCL 50 MG/ML IJ SOLN
25.0000 mg | INTRAMUSCULAR | Status: AC
  Administered 2024-01-27: 25 mg via INTRAVENOUS
  Filled 2024-01-27: qty 1

## 2024-01-27 MED ORDER — DEXTROSE 50 % IV SOLN
12.5000 g | INTRAVENOUS | Status: AC
  Administered 2024-01-27: 12.5 g via INTRAVENOUS

## 2024-01-27 MED ORDER — METHYLPREDNISOLONE SODIUM SUCC 125 MG IJ SOLR
125.0000 mg | INTRAMUSCULAR | Status: AC
  Administered 2024-01-27: 125 mg via INTRAVENOUS
  Filled 2024-01-27: qty 2

## 2024-01-27 MED ORDER — DEXTROSE 50 % IV SOLN
INTRAVENOUS | Status: AC
  Filled 2024-01-27: qty 50

## 2024-01-27 MED ORDER — SODIUM CHLORIDE 0.9 % IV SOLN
INTRAVENOUS | Status: DC | PRN
  Administered 2024-01-27: 10 mL/h via INTRAVENOUS

## 2024-01-27 MED ORDER — LIDOCAINE HCL (PF) 1 % IJ SOLN
INTRAMUSCULAR | Status: AC
Start: 2024-01-27 — End: ?
  Filled 2024-01-27: qty 30

## 2024-01-27 SURGICAL SUPPLY — 9 items
CATH ANGIO 5F BER2 65CM (CATHETERS) IMPLANT
COVER DOME SNAP 22 D (MISCELLANEOUS) ×2 IMPLANT
GUIDEWIRE ANGLED .035X150CM (WIRE) IMPLANT
KIT MICROPUNCTURE NIT STIFF (SHEATH) IMPLANT
SHEATH PINNACLE R/O II 6F 4CM (SHEATH) IMPLANT
SHEATH PROBE COVER 6X72 (BAG) ×2 IMPLANT
STOPCOCK MORSE 400PSI 3WAY (MISCELLANEOUS) ×2 IMPLANT
TRAY PV CATH (CUSTOM PROCEDURE TRAY) ×2 IMPLANT
TUBING CIL FLEX 10 FLL-RA (TUBING) ×2 IMPLANT

## 2024-01-27 NOTE — Op Note (Signed)
    Patient name: [name] MRN: [id number] DOB: 05/17/1961 Sex: male  01/27/2024 Pre-operative Diagnosis: ESRD on HD with significant left arm swelling Post-operative diagnosis:  Same Surgeon:  Philipp Brawn, MD Procedure Performed:  Ultrasound-guided access of left upper extremity aVF Central venogram and fistulogram   Indications: [name] is a 63 year old male who underwent brachiocephalic AV fistula creation by Dr. Vikki Graves on 11/19/2023.  Since then he has had significant left forearm swelling with associated pain and tenderness.  He has been trying to elevate his arm although he said minimal improvement.  Therefore risk and benefits of fistulogram with intervention were reviewed, he expressed understanding and elected to proceed.  Findings:   Widely patent central venous system, no evidence of stenosis in multiple obliques.  Right central venous catheter in place. Tortuous but widely patent brachiocephalic fistula.  Widely patent anastomosis.   Procedure:  The patient was identified in the holding area and taken to the cath lab  The patient was then placed supine on the table and prepped and draped in the usual sterile fashion.  A time out was called.  Ultrasound was used to evaluate the left arm AV access. This was accessed under u/s guidance. An 018 wire was advanced without resistance, a micropuncture sheath was placed and fistulagram obtained which demonstrated the above findings.  This access was then upsized to a 37F short sheath over a glidewire.  Given his significant symptoms I navigated to the central venous system with a soft Glidewire and a bare catheter.  A dedicated subclavian vein injection was performed which demonstrated a widely patent subclavian vein, innominate vein and SVC.  There did not appear to be any stenosis associated with the catheter and wire and catheter both easily tracked through the SVC.  The wire and catheter were removed.  The 6 French sheath was removed  and a figure-of-eight suture with 4-0 Monocryl was placed for hemostasis.  Contrast: 45 cc  Impression: Widely patent fistula and central venous system, no evidence of stenosis.   Philipp Brawn MD [facility] [place] Office: [phone]

## 2024-01-27 NOTE — Interval H&P Note (Signed)
 History and Physical Interval Note:  01/27/2024 2:11 PM  [name]  has presented today for surgery, with the diagnosis of End stage renal diease.  The various methods of treatment have been discussed with the patient and family. After consideration of risks, benefits and other options for treatment, the patient has consented to  Procedure(s): A/V Fistulagram (Right) as a surgical intervention.  The patient's history has been reviewed, patient examined, no change in status, stable for surgery.  I have reviewed the patient's chart and labs.  Questions were answered to the patient's satisfaction.     Angela Kell

## 2024-01-27 NOTE — Progress Notes (Signed)
 Right arm swollen from hand to shoulder and per Dr Susi Eric ace wrap placed on right arm from wrist to right upper arm

## 2024-01-28 DIAGNOSIS — D631 Anemia in chronic kidney disease: Secondary | ICD-10-CM | POA: Diagnosis not present

## 2024-01-28 DIAGNOSIS — E1129 Type 2 diabetes mellitus with other diabetic kidney complication: Secondary | ICD-10-CM | POA: Diagnosis not present

## 2024-01-28 DIAGNOSIS — N186 End stage renal disease: Secondary | ICD-10-CM | POA: Diagnosis not present

## 2024-01-28 DIAGNOSIS — Z992 Dependence on renal dialysis: Secondary | ICD-10-CM | POA: Diagnosis not present

## 2024-01-28 DIAGNOSIS — R310 Gross hematuria: Secondary | ICD-10-CM | POA: Diagnosis not present

## 2024-01-28 DIAGNOSIS — R3915 Urgency of urination: Secondary | ICD-10-CM | POA: Diagnosis not present

## 2024-01-28 DIAGNOSIS — D509 Iron deficiency anemia, unspecified: Secondary | ICD-10-CM | POA: Diagnosis not present

## 2024-01-28 DIAGNOSIS — N2581 Secondary hyperparathyroidism of renal origin: Secondary | ICD-10-CM | POA: Diagnosis not present

## 2024-01-29 DIAGNOSIS — N186 End stage renal disease: Secondary | ICD-10-CM | POA: Diagnosis not present

## 2024-01-29 DIAGNOSIS — N2581 Secondary hyperparathyroidism of renal origin: Secondary | ICD-10-CM | POA: Diagnosis not present

## 2024-01-29 DIAGNOSIS — Z992 Dependence on renal dialysis: Secondary | ICD-10-CM | POA: Diagnosis not present

## 2024-01-29 DIAGNOSIS — D631 Anemia in chronic kidney disease: Secondary | ICD-10-CM | POA: Diagnosis not present

## 2024-01-29 DIAGNOSIS — E1129 Type 2 diabetes mellitus with other diabetic kidney complication: Secondary | ICD-10-CM | POA: Diagnosis not present

## 2024-01-29 DIAGNOSIS — D509 Iron deficiency anemia, unspecified: Secondary | ICD-10-CM | POA: Diagnosis not present

## 2024-01-30 ENCOUNTER — Other Ambulatory Visit (HOSPITAL_COMMUNITY): Payer: Self-pay | Admitting: Nurse Practitioner

## 2024-01-30 DIAGNOSIS — R310 Gross hematuria: Secondary | ICD-10-CM

## 2024-01-31 DIAGNOSIS — D509 Iron deficiency anemia, unspecified: Secondary | ICD-10-CM | POA: Diagnosis not present

## 2024-01-31 DIAGNOSIS — N2581 Secondary hyperparathyroidism of renal origin: Secondary | ICD-10-CM | POA: Diagnosis not present

## 2024-01-31 DIAGNOSIS — N186 End stage renal disease: Secondary | ICD-10-CM | POA: Diagnosis not present

## 2024-01-31 DIAGNOSIS — Z992 Dependence on renal dialysis: Secondary | ICD-10-CM | POA: Diagnosis not present

## 2024-01-31 DIAGNOSIS — D631 Anemia in chronic kidney disease: Secondary | ICD-10-CM | POA: Diagnosis not present

## 2024-01-31 DIAGNOSIS — E1129 Type 2 diabetes mellitus with other diabetic kidney complication: Secondary | ICD-10-CM | POA: Diagnosis not present

## 2024-02-03 DIAGNOSIS — D509 Iron deficiency anemia, unspecified: Secondary | ICD-10-CM | POA: Diagnosis not present

## 2024-02-03 DIAGNOSIS — N186 End stage renal disease: Secondary | ICD-10-CM | POA: Diagnosis not present

## 2024-02-03 DIAGNOSIS — E1129 Type 2 diabetes mellitus with other diabetic kidney complication: Secondary | ICD-10-CM | POA: Diagnosis not present

## 2024-02-03 DIAGNOSIS — Z992 Dependence on renal dialysis: Secondary | ICD-10-CM | POA: Diagnosis not present

## 2024-02-03 DIAGNOSIS — N2581 Secondary hyperparathyroidism of renal origin: Secondary | ICD-10-CM | POA: Diagnosis not present

## 2024-02-03 DIAGNOSIS — D631 Anemia in chronic kidney disease: Secondary | ICD-10-CM | POA: Diagnosis not present

## 2024-02-05 DIAGNOSIS — N186 End stage renal disease: Secondary | ICD-10-CM | POA: Diagnosis not present

## 2024-02-05 DIAGNOSIS — Z992 Dependence on renal dialysis: Secondary | ICD-10-CM | POA: Diagnosis not present

## 2024-02-05 DIAGNOSIS — D631 Anemia in chronic kidney disease: Secondary | ICD-10-CM | POA: Diagnosis not present

## 2024-02-05 DIAGNOSIS — D509 Iron deficiency anemia, unspecified: Secondary | ICD-10-CM | POA: Diagnosis not present

## 2024-02-05 DIAGNOSIS — N2581 Secondary hyperparathyroidism of renal origin: Secondary | ICD-10-CM | POA: Diagnosis not present

## 2024-02-05 DIAGNOSIS — E1129 Type 2 diabetes mellitus with other diabetic kidney complication: Secondary | ICD-10-CM | POA: Diagnosis not present

## 2024-02-07 DIAGNOSIS — E1129 Type 2 diabetes mellitus with other diabetic kidney complication: Secondary | ICD-10-CM | POA: Diagnosis not present

## 2024-02-07 DIAGNOSIS — D509 Iron deficiency anemia, unspecified: Secondary | ICD-10-CM | POA: Diagnosis not present

## 2024-02-07 DIAGNOSIS — D631 Anemia in chronic kidney disease: Secondary | ICD-10-CM | POA: Diagnosis not present

## 2024-02-07 DIAGNOSIS — N2581 Secondary hyperparathyroidism of renal origin: Secondary | ICD-10-CM | POA: Diagnosis not present

## 2024-02-07 DIAGNOSIS — Z992 Dependence on renal dialysis: Secondary | ICD-10-CM | POA: Diagnosis not present

## 2024-02-07 DIAGNOSIS — N186 End stage renal disease: Secondary | ICD-10-CM | POA: Diagnosis not present

## 2024-02-08 ENCOUNTER — Ambulatory Visit (HOSPITAL_COMMUNITY)
Admission: RE | Admit: 2024-02-08 | Discharge: 2024-02-08 | Disposition: A | Discharge status: Home / Self Care | Source: Ambulatory Visit | Attending: Nurse Practitioner | Admitting: Nurse Practitioner

## 2024-02-08 DIAGNOSIS — N400 Enlarged prostate without lower urinary tract symptoms: Secondary | ICD-10-CM | POA: Diagnosis not present

## 2024-02-08 DIAGNOSIS — R310 Gross hematuria: Secondary | ICD-10-CM | POA: Diagnosis not present

## 2024-02-08 DIAGNOSIS — K429 Umbilical hernia without obstruction or gangrene: Secondary | ICD-10-CM | POA: Diagnosis not present

## 2024-02-08 DIAGNOSIS — N200 Calculus of kidney: Secondary | ICD-10-CM | POA: Diagnosis not present

## 2024-02-08 DIAGNOSIS — K5730 Diverticulosis of large intestine without perforation or abscess without bleeding: Secondary | ICD-10-CM | POA: Diagnosis not present

## 2024-02-10 DIAGNOSIS — Z992 Dependence on renal dialysis: Secondary | ICD-10-CM | POA: Diagnosis not present

## 2024-02-10 DIAGNOSIS — N2581 Secondary hyperparathyroidism of renal origin: Secondary | ICD-10-CM | POA: Diagnosis not present

## 2024-02-10 DIAGNOSIS — I130 Hypertensive heart and chronic kidney disease with heart failure and stage 1 through stage 4 chronic kidney disease, or unspecified chronic kidney disease: Secondary | ICD-10-CM | POA: Diagnosis not present

## 2024-02-10 DIAGNOSIS — N189 Chronic kidney disease, unspecified: Secondary | ICD-10-CM | POA: Diagnosis not present

## 2024-02-10 DIAGNOSIS — I5030 Unspecified diastolic (congestive) heart failure: Secondary | ICD-10-CM | POA: Diagnosis not present

## 2024-02-10 DIAGNOSIS — G894 Chronic pain syndrome: Secondary | ICD-10-CM | POA: Diagnosis not present

## 2024-02-10 DIAGNOSIS — D509 Iron deficiency anemia, unspecified: Secondary | ICD-10-CM | POA: Diagnosis not present

## 2024-02-10 DIAGNOSIS — N186 End stage renal disease: Secondary | ICD-10-CM | POA: Diagnosis not present

## 2024-02-10 DIAGNOSIS — D631 Anemia in chronic kidney disease: Secondary | ICD-10-CM | POA: Diagnosis not present

## 2024-02-10 DIAGNOSIS — E1129 Type 2 diabetes mellitus with other diabetic kidney complication: Secondary | ICD-10-CM | POA: Diagnosis not present

## 2024-02-12 DIAGNOSIS — D631 Anemia in chronic kidney disease: Secondary | ICD-10-CM | POA: Diagnosis not present

## 2024-02-12 DIAGNOSIS — N2581 Secondary hyperparathyroidism of renal origin: Secondary | ICD-10-CM | POA: Diagnosis not present

## 2024-02-12 DIAGNOSIS — N186 End stage renal disease: Secondary | ICD-10-CM | POA: Diagnosis not present

## 2024-02-12 DIAGNOSIS — Z992 Dependence on renal dialysis: Secondary | ICD-10-CM | POA: Diagnosis not present

## 2024-02-12 DIAGNOSIS — E1129 Type 2 diabetes mellitus with other diabetic kidney complication: Secondary | ICD-10-CM | POA: Diagnosis not present

## 2024-02-12 DIAGNOSIS — D509 Iron deficiency anemia, unspecified: Secondary | ICD-10-CM | POA: Diagnosis not present

## 2024-02-17 DIAGNOSIS — E1129 Type 2 diabetes mellitus with other diabetic kidney complication: Secondary | ICD-10-CM | POA: Diagnosis not present

## 2024-02-17 DIAGNOSIS — Z992 Dependence on renal dialysis: Secondary | ICD-10-CM | POA: Diagnosis not present

## 2024-02-17 DIAGNOSIS — D509 Iron deficiency anemia, unspecified: Secondary | ICD-10-CM | POA: Diagnosis not present

## 2024-02-17 DIAGNOSIS — D631 Anemia in chronic kidney disease: Secondary | ICD-10-CM | POA: Diagnosis not present

## 2024-02-17 DIAGNOSIS — N2581 Secondary hyperparathyroidism of renal origin: Secondary | ICD-10-CM | POA: Diagnosis not present

## 2024-02-17 DIAGNOSIS — N186 End stage renal disease: Secondary | ICD-10-CM | POA: Diagnosis not present

## 2024-02-19 ENCOUNTER — Encounter: Payer: Self-pay | Admitting: Vascular Surgery

## 2024-02-19 ENCOUNTER — Ambulatory Visit: Attending: Vascular Surgery | Admitting: Vascular Surgery

## 2024-02-19 VITALS — BP 119/68 | HR 93 | Temp 100.2°F | Ht 79.0 in

## 2024-02-19 DIAGNOSIS — D509 Iron deficiency anemia, unspecified: Secondary | ICD-10-CM | POA: Diagnosis not present

## 2024-02-19 DIAGNOSIS — N186 End stage renal disease: Secondary | ICD-10-CM | POA: Insufficient documentation

## 2024-02-19 DIAGNOSIS — E1129 Type 2 diabetes mellitus with other diabetic kidney complication: Secondary | ICD-10-CM | POA: Diagnosis not present

## 2024-02-19 DIAGNOSIS — D631 Anemia in chronic kidney disease: Secondary | ICD-10-CM | POA: Diagnosis not present

## 2024-02-19 DIAGNOSIS — Z992 Dependence on renal dialysis: Secondary | ICD-10-CM | POA: Diagnosis not present

## 2024-02-19 DIAGNOSIS — N2581 Secondary hyperparathyroidism of renal origin: Secondary | ICD-10-CM | POA: Diagnosis not present

## 2024-02-19 NOTE — Progress Notes (Signed)
 Patient ID: [name], male   DOB: 1961-02-05, 63 y.o.   MRN: [id number]  Reason for Consult: Follow-up   Referred by [facility]  Subjective:     HPI:  [name] is a 63 y.o. male with history of end-stage renal disease currently on dialysis via right IJ catheter.  He recently underwent revision of his forearm fistula to an upper arm fistula this has not been cannulated yet.  He is having a low-grade fever after dialysis today and is feeling rather weak.  He is here to discuss peritoneal dialysis catheter placement.  He has had a history of a heart transplant and also has a history of laparoscopic gastric banding that was removed and subsequently had laparoscopic weight loss surgery either gastric sleeve or gastric bypass with his family is unsure.  He remains on blood thinners.  He is not very interactive today as he is not feeling well.  Past Medical History:  Diagnosis Date   Anxiety    Arthritis    Atrial fibrillation (HCC)    CHF (congestive heart failure), NYHA class III (HCC)    s/p heart transplant   Chronic systolic dysfunction of left ventricle    CVA (cerebral infarction) 2010   Dementia (HCC)    Depression    Diabetes mellitus    PMH; Prior to heart transplant   ESRD on hemodialysis (HCC)    Family history of coronary artery disease    in both parents   GERD (gastroesophageal reflux disease)    Hyperlipidemia    Hypertension    Left bundle branch block    Morbid obesity (HCC)    status post lap band   Myocardial infarction [facility])    prior to heart transplant   Nonischemic cardiomyopathy (HCC)    prior to heart transplant   Obesity (BMI 30-39.9)    Obstructive sleep apnea    no longer needs CPAP after heart transplant per pt   Premature ventricular contractions    Prostate cancer (HCC)    SOB (shortness of breath)    Family History  Problem Relation Age of Onset   Coronary artery disease Father        had, PTCA & CABG   Heart attack  Father    Hypertension Father    Diabetes Father    Prostate cancer Father    Coronary artery disease Mother    Heart attack Mother    Hypertension Mother    Stroke Mother    Lupus Mother    Prostate cancer Brother    Coronary artery disease Brother    Coronary artery disease Sister    Heart attack Sister    Prostate cancer Paternal Uncle    Coronary artery disease Maternal Grandmother    Coronary artery disease Maternal Grandfather    Coronary artery disease Paternal Grandmother    Coronary artery disease Paternal Grandfather    Past Surgical History:  Procedure Laterality Date   A/V FISTULAGRAM Right 05/23/2023   Procedure: A/V Fistulagram;  Surgeon: Lanis Fonda BRAVO, MD;  Location: [facility] INVASIVE CV LAB;  Service: Cardiovascular;  Laterality: Right;   A/V FISTULAGRAM Right 01/27/2024   Procedure: A/V Fistulagram;  Surgeon: Pearline Norman RAMAN, MD;  Location: [facility] INVASIVE CV LAB;  Service: Vascular;  Laterality: Right;   AV FISTULA PLACEMENT Right 07/04/2023   Procedure: ARTERIOVENOUS (AV) FISTULA  REVISION WITH APPLICATION OF ARTEGRAFT RIGHT ARM;  Surgeon: Sheree Penne Bruckner, MD;  Location: [facility] OR;  Service: Vascular;  Laterality: Right;   AV FISTULA PLACEMENT Right 11/19/2023   Procedure: RIGHT BRACHIOCEPHALIC ARTERIOVENOUS (AV) FISTULA CREATION;  Surgeon: Sheree Penne Bruckner, MD;  Location[facility] OR;  Service: Vascular;  Laterality: Right;   CARDIAC PACEMAKER PLACEMENT  09/21/2009   Biventricular implantable cardioverter-defibrillator implantation      COLONOSCOPY W/ BIOPSIES AND POLYPECTOMY     CYSTOSCOPY WITH FULGERATION N/A 04/27/2021   Procedure: CYSTOSCOPY AND CLOT EVACUATION WITH BLADDER BIOPSY/ FUGARATION OF BLADDER/ FULGARATION OF PROSTATE ;  Surgeon: Nieves Cough, MD;  Location: [facility] ORS;  Service: Urology;  Laterality: N/A;   FOOT SURGERY     left   HEART TRANSPLANT  2014   INSERTION OF DIALYSIS CATHETER Right 07/04/2023   Procedure: INSERTION OF DIALYSIS CATHETER  WITH 19 CM PALINDROME;  Surgeon: Sheree Penne Bruckner, MD;  Location: [facility] OR;  Service: Vascular;  Laterality: Right;   LAPAROSCOPIC GASTRIC BANDING  01/27/2007   LEFT VENTRICULAR ASSIST DEVICE     implanted at [facility]   PACEMAKER REMOVAL     PROSTATE BIOPSY     TOTAL HIP ARTHROPLASTY Right 07/22/2017   Procedure: RIGHT TOTAL HIP ARTHROPLASTY ANTERIOR APPROACH;  Surgeon: Fidel Rogue, MD;  Location: [facility] OR;  Service: Orthopedics;  Laterality: Right;  Needs RNFA   TOTAL KNEE ARTHROPLASTY Right 07/22/2017    Short Social History:  Social History   Tobacco Use   Smoking status: Never   Smokeless tobacco: Former    Types: Snuff  Substance Use Topics   Alcohol  use: No    Allergies  Allergen Reactions   Heparin  Nausea Only, Swelling and Other (See Comments)    * * HIT * *  SWELLING REACTION UNSPECIFIED   DIAPHORESIS    Iodinated Contrast Media     Unknown reaction   Penicillin G Potassium [Penicillin G] Nausea Only and Other (See Comments)    DIAPHORESIS High Doses    Current Outpatient Medications  Medication Sig Dispense Refill   acetaminophen  (TYLENOL ) 500 MG tablet Take 1,000 mg by mouth 2 (two) times daily as needed for moderate pain (pain score 4-6).     ARIPiprazole  (ABILIFY ) 10 MG tablet Take 10 mg by mouth every morning.     atorvastatin  (LIPITOR) 40 MG tablet Take 1 tablet (40 mg total) by mouth daily. 30 tablet 0   cinacalcet  (SENSIPAR ) 30 MG tablet Take 4 tablets (120 mg total) by mouth daily with supper.     cycloSPORINE  modified (NEORAL ) 100 MG capsule Take 100 mg by mouth See admin instructions. Take with 25 mg for a total of 125 mg twice daily     cycloSPORINE  modified 25 MG CAPS Take 25 mg by mouth See admin instructions. Take with 100 mg for a total of 125 mg twice daily     divalproex  (DEPAKOTE ) 125 MG DR tablet Take 250 mg by mouth in morning and 500 mg by mouth at night (Patient taking differently: Take 250-500 mg by mouth See admin instructions. Take  250 mg by mouth in morning and 500 mg by mouth at night) 180 tablet 11   docusate sodium  (COLACE) 100 MG capsule Take 100 mg by mouth daily.     DULoxetine  (CYMBALTA ) 60 MG capsule TAKE 1 CAPSULE(60 MG) BY MOUTH DAILY 90 capsule 3   ELIQUIS  2.5 MG TABS tablet Take 1 tablet (2.5 mg total) by mouth 2 (two) times daily. 60 tablet    gentamicin  cream (GARAMYCIN ) 0.1 % Apply 1 Application topically 2 (two) times daily. 30 g 1   hydrOXYzine  (  ATARAX ) 25 MG tablet Take 25 mg by mouth 2 (two) times daily.     midodrine  (PROAMATINE ) 10 MG tablet Take 1 tablet (10 mg total) by mouth 3 (three) times daily with meals. (Patient taking differently: Take 10 mg by mouth See admin instructions. 10 mg 3 times daily on Dialysis days Mon Wed Fri, and 10 mg 2 times daily Sun Tue Th Sa) 90 tablet 0   naloxone (NARCAN) nasal spray 4 mg/0.1 mL Place 0.4 mg into the nose once as needed (opioid overdose).     oxyCODONE -acetaminophen  (PERCOCET) 5-325 MG tablet Take 1 tablet by mouth every 6 (six) hours as needed (pain). Do not take with other opiate pain medication 8 tablet 0   predniSONE (DELTASONE) 5 MG tablet Take 5 mg by mouth daily with breakfast.     pregabalin  (LYRICA ) 75 MG capsule Take 1 capsule (75 mg total) by mouth daily. 30 capsule 0   sevelamer  carbonate (RENVELA ) 800 MG tablet Take 2 tablets (1,600 mg total) by mouth 3 (three) times daily with meals. 120 tablet 0   traZODone  (DESYREL ) 100 MG tablet Take 100 mg by mouth at bedtime.     white petrolatum  (VASELINE) OINT Apply 1 Application topically as needed for dry skin (Can put on the right arm). 30 g 0   No current facility-administered medications for this visit.    Review of Systems  Constitutional: Positive for fever.  HENT: HENT negative.  Eyes: Eyes negative.  GI: Gastrointestinal negative.  Skin: Skin negative.  Neurological: Positive for focal weakness.  Hematologic: Hematologic/lymphatic negative.  Psychiatric: Psychiatric negative.         Objective:  Objective   Vitals:   02/19/24 1340  BP: 119/68  Pulse: 93  Temp: 100.2 F (37.9 C)  Height: 6' 7 (2.007 m)   Body mass index is 26.36 kg/m.  Physical Exam Constitutional:      Comments: He is not very interactive today  HENT:     Head: Normocephalic.   Cardiovascular:     Rate and Rhythm: Tachycardia present.   Musculoskeletal:     Comments: Strong right upper arm thrill   Neurological:     Mental Status: He is alert.     Motor: Weakness present.     Comments: Tremor  Psychiatric:        Mood and Affect: Mood normal.     Data: No new studies     Assessment/Plan:    63 year old male here to discuss possible peritoneal dialysis catheter placement.  We first discussed that it is okay to begin to use his right upper arm AV fistula in an effort to get his [facility] removed.  He does have a low-grade fever today and I discussed with his family to watch this and if this gets any worse or does not improve he will need further evaluation at the hospital especially with indwelling catheter.  The catheter itself looks okay and functioned well at dialysis today.  From a PD catheter placement standpoint he would be high risk having had a history of multiple laparoscopic procedures in his abdomen and also from his size and previous heart transplant he does have multiple scars on his abdomen.  We discussed that he would be high risk for intra-abdominal injury and high risk of primary nonfunction and possibly not even be able to place the catheter at the time of surgery.  We discussed that surgery would require general anesthesia and if the catheter does not work it would  be a second anesthetic to remove the catheter.  All questions were answered they will call to schedule peritoneal dialysis catheter placement as needed.  Eliquis  will need to be held for at least 48 hours prior to surgery.     Penne Lonni Colorado MD [facility]

## 2024-02-20 DIAGNOSIS — I739 Peripheral vascular disease, unspecified: Secondary | ICD-10-CM | POA: Diagnosis not present

## 2024-02-20 DIAGNOSIS — F331 Major depressive disorder, recurrent, moderate: Secondary | ICD-10-CM | POA: Diagnosis not present

## 2024-02-20 DIAGNOSIS — F0393 Unspecified dementia, unspecified severity, with mood disturbance: Secondary | ICD-10-CM | POA: Diagnosis not present

## 2024-02-20 DIAGNOSIS — I420 Dilated cardiomyopathy: Secondary | ICD-10-CM | POA: Diagnosis not present

## 2024-02-20 DIAGNOSIS — F03918 Unspecified dementia, unspecified severity, with other behavioral disturbance: Secondary | ICD-10-CM | POA: Diagnosis not present

## 2024-02-21 DIAGNOSIS — D631 Anemia in chronic kidney disease: Secondary | ICD-10-CM | POA: Diagnosis not present

## 2024-02-21 DIAGNOSIS — Z992 Dependence on renal dialysis: Secondary | ICD-10-CM | POA: Diagnosis not present

## 2024-02-21 DIAGNOSIS — D509 Iron deficiency anemia, unspecified: Secondary | ICD-10-CM | POA: Diagnosis not present

## 2024-02-21 DIAGNOSIS — E1129 Type 2 diabetes mellitus with other diabetic kidney complication: Secondary | ICD-10-CM | POA: Diagnosis not present

## 2024-02-21 DIAGNOSIS — N186 End stage renal disease: Secondary | ICD-10-CM | POA: Diagnosis not present

## 2024-02-21 DIAGNOSIS — N2581 Secondary hyperparathyroidism of renal origin: Secondary | ICD-10-CM | POA: Diagnosis not present

## 2024-02-24 DIAGNOSIS — E1129 Type 2 diabetes mellitus with other diabetic kidney complication: Secondary | ICD-10-CM | POA: Diagnosis not present

## 2024-02-24 DIAGNOSIS — Z992 Dependence on renal dialysis: Secondary | ICD-10-CM | POA: Diagnosis not present

## 2024-02-24 DIAGNOSIS — D631 Anemia in chronic kidney disease: Secondary | ICD-10-CM | POA: Diagnosis not present

## 2024-02-24 DIAGNOSIS — N2581 Secondary hyperparathyroidism of renal origin: Secondary | ICD-10-CM | POA: Diagnosis not present

## 2024-02-24 DIAGNOSIS — N186 End stage renal disease: Secondary | ICD-10-CM | POA: Diagnosis not present

## 2024-02-24 DIAGNOSIS — D509 Iron deficiency anemia, unspecified: Secondary | ICD-10-CM | POA: Diagnosis not present

## 2024-02-25 DIAGNOSIS — F331 Major depressive disorder, recurrent, moderate: Secondary | ICD-10-CM | POA: Diagnosis not present

## 2024-02-25 DIAGNOSIS — Z7901 Long term (current) use of anticoagulants: Secondary | ICD-10-CM | POA: Diagnosis not present

## 2024-02-25 DIAGNOSIS — Z6833 Body mass index (BMI) 33.0-33.9, adult: Secondary | ICD-10-CM | POA: Diagnosis not present

## 2024-02-25 DIAGNOSIS — I2510 Atherosclerotic heart disease of native coronary artery without angina pectoris: Secondary | ICD-10-CM | POA: Diagnosis not present

## 2024-02-25 DIAGNOSIS — F02C0 Dementia in other diseases classified elsewhere, severe, without behavioral disturbance, psychotic disturbance, mood disturbance, and anxiety: Secondary | ICD-10-CM | POA: Diagnosis not present

## 2024-02-25 DIAGNOSIS — I4891 Unspecified atrial fibrillation: Secondary | ICD-10-CM | POA: Diagnosis not present

## 2024-02-25 DIAGNOSIS — F0283 Dementia in other diseases classified elsewhere, unspecified severity, with mood disturbance: Secondary | ICD-10-CM | POA: Diagnosis not present

## 2024-02-25 DIAGNOSIS — G4733 Obstructive sleep apnea (adult) (pediatric): Secondary | ICD-10-CM | POA: Diagnosis not present

## 2024-02-25 DIAGNOSIS — F02818 Dementia in other diseases classified elsewhere, unspecified severity, with other behavioral disturbance: Secondary | ICD-10-CM | POA: Diagnosis not present

## 2024-02-25 DIAGNOSIS — I951 Orthostatic hypotension: Secondary | ICD-10-CM | POA: Diagnosis not present

## 2024-02-25 DIAGNOSIS — Z992 Dependence on renal dialysis: Secondary | ICD-10-CM | POA: Diagnosis not present

## 2024-02-25 DIAGNOSIS — T8620 Unspecified complication of heart transplant: Secondary | ICD-10-CM | POA: Diagnosis not present

## 2024-02-25 DIAGNOSIS — D6859 Other primary thrombophilia: Secondary | ICD-10-CM | POA: Diagnosis not present

## 2024-02-25 DIAGNOSIS — I509 Heart failure, unspecified: Secondary | ICD-10-CM | POA: Diagnosis not present

## 2024-02-25 DIAGNOSIS — D631 Anemia in chronic kidney disease: Secondary | ICD-10-CM | POA: Diagnosis not present

## 2024-02-25 DIAGNOSIS — E7800 Pure hypercholesterolemia, unspecified: Secondary | ICD-10-CM | POA: Diagnosis not present

## 2024-02-25 DIAGNOSIS — N186 End stage renal disease: Secondary | ICD-10-CM | POA: Diagnosis not present

## 2024-02-25 DIAGNOSIS — G20C Parkinsonism, unspecified: Secondary | ICD-10-CM | POA: Diagnosis not present

## 2024-02-25 DIAGNOSIS — G894 Chronic pain syndrome: Secondary | ICD-10-CM | POA: Diagnosis not present

## 2024-02-25 DIAGNOSIS — E1151 Type 2 diabetes mellitus with diabetic peripheral angiopathy without gangrene: Secondary | ICD-10-CM | POA: Diagnosis not present

## 2024-02-25 DIAGNOSIS — E1122 Type 2 diabetes mellitus with diabetic chronic kidney disease: Secondary | ICD-10-CM | POA: Diagnosis not present

## 2024-02-25 DIAGNOSIS — I132 Hypertensive heart and chronic kidney disease with heart failure and with stage 5 chronic kidney disease, or end stage renal disease: Secondary | ICD-10-CM | POA: Diagnosis not present

## 2024-02-25 DIAGNOSIS — I420 Dilated cardiomyopathy: Secondary | ICD-10-CM | POA: Diagnosis not present

## 2024-02-25 DIAGNOSIS — M159 Polyosteoarthritis, unspecified: Secondary | ICD-10-CM | POA: Diagnosis not present

## 2024-02-25 DIAGNOSIS — E1142 Type 2 diabetes mellitus with diabetic polyneuropathy: Secondary | ICD-10-CM | POA: Diagnosis not present

## 2024-02-26 DIAGNOSIS — Z992 Dependence on renal dialysis: Secondary | ICD-10-CM | POA: Diagnosis not present

## 2024-02-26 DIAGNOSIS — Z23 Encounter for immunization: Secondary | ICD-10-CM | POA: Diagnosis not present

## 2024-02-26 DIAGNOSIS — N2581 Secondary hyperparathyroidism of renal origin: Secondary | ICD-10-CM | POA: Diagnosis not present

## 2024-02-26 DIAGNOSIS — N186 End stage renal disease: Secondary | ICD-10-CM | POA: Diagnosis not present

## 2024-02-26 DIAGNOSIS — D631 Anemia in chronic kidney disease: Secondary | ICD-10-CM | POA: Diagnosis not present

## 2024-02-27 DIAGNOSIS — E1122 Type 2 diabetes mellitus with diabetic chronic kidney disease: Secondary | ICD-10-CM | POA: Diagnosis not present

## 2024-02-27 DIAGNOSIS — I420 Dilated cardiomyopathy: Secondary | ICD-10-CM | POA: Diagnosis not present

## 2024-02-27 DIAGNOSIS — D631 Anemia in chronic kidney disease: Secondary | ICD-10-CM | POA: Diagnosis not present

## 2024-02-27 DIAGNOSIS — I132 Hypertensive heart and chronic kidney disease with heart failure and with stage 5 chronic kidney disease, or end stage renal disease: Secondary | ICD-10-CM | POA: Diagnosis not present

## 2024-02-27 DIAGNOSIS — N186 End stage renal disease: Secondary | ICD-10-CM | POA: Diagnosis not present

## 2024-02-27 DIAGNOSIS — I509 Heart failure, unspecified: Secondary | ICD-10-CM | POA: Diagnosis not present

## 2024-02-28 DIAGNOSIS — N186 End stage renal disease: Secondary | ICD-10-CM | POA: Diagnosis not present

## 2024-02-28 DIAGNOSIS — Z23 Encounter for immunization: Secondary | ICD-10-CM | POA: Diagnosis not present

## 2024-02-28 DIAGNOSIS — Z992 Dependence on renal dialysis: Secondary | ICD-10-CM | POA: Diagnosis not present

## 2024-02-28 DIAGNOSIS — N2581 Secondary hyperparathyroidism of renal origin: Secondary | ICD-10-CM | POA: Diagnosis not present

## 2024-02-28 DIAGNOSIS — D631 Anemia in chronic kidney disease: Secondary | ICD-10-CM | POA: Diagnosis not present

## 2024-03-02 DIAGNOSIS — F411 Generalized anxiety disorder: Secondary | ICD-10-CM | POA: Diagnosis not present

## 2024-03-02 DIAGNOSIS — D631 Anemia in chronic kidney disease: Secondary | ICD-10-CM | POA: Diagnosis not present

## 2024-03-02 DIAGNOSIS — F331 Major depressive disorder, recurrent, moderate: Secondary | ICD-10-CM | POA: Diagnosis not present

## 2024-03-02 DIAGNOSIS — Z992 Dependence on renal dialysis: Secondary | ICD-10-CM | POA: Diagnosis not present

## 2024-03-02 DIAGNOSIS — N186 End stage renal disease: Secondary | ICD-10-CM | POA: Diagnosis not present

## 2024-03-02 DIAGNOSIS — N2581 Secondary hyperparathyroidism of renal origin: Secondary | ICD-10-CM | POA: Diagnosis not present

## 2024-03-02 DIAGNOSIS — F6381 Intermittent explosive disorder: Secondary | ICD-10-CM | POA: Diagnosis not present

## 2024-03-02 DIAGNOSIS — Z23 Encounter for immunization: Secondary | ICD-10-CM | POA: Diagnosis not present

## 2024-03-02 DIAGNOSIS — F02818 Dementia in other diseases classified elsewhere, unspecified severity, with other behavioral disturbance: Secondary | ICD-10-CM | POA: Diagnosis not present

## 2024-03-03 DIAGNOSIS — I509 Heart failure, unspecified: Secondary | ICD-10-CM | POA: Diagnosis not present

## 2024-03-03 DIAGNOSIS — N186 End stage renal disease: Secondary | ICD-10-CM | POA: Diagnosis not present

## 2024-03-03 DIAGNOSIS — I132 Hypertensive heart and chronic kidney disease with heart failure and with stage 5 chronic kidney disease, or end stage renal disease: Secondary | ICD-10-CM | POA: Diagnosis not present

## 2024-03-03 DIAGNOSIS — D631 Anemia in chronic kidney disease: Secondary | ICD-10-CM | POA: Diagnosis not present

## 2024-03-03 DIAGNOSIS — E1122 Type 2 diabetes mellitus with diabetic chronic kidney disease: Secondary | ICD-10-CM | POA: Diagnosis not present

## 2024-03-03 DIAGNOSIS — I420 Dilated cardiomyopathy: Secondary | ICD-10-CM | POA: Diagnosis not present

## 2024-03-04 DIAGNOSIS — Z992 Dependence on renal dialysis: Secondary | ICD-10-CM | POA: Diagnosis not present

## 2024-03-04 DIAGNOSIS — N186 End stage renal disease: Secondary | ICD-10-CM | POA: Diagnosis not present

## 2024-03-04 DIAGNOSIS — D631 Anemia in chronic kidney disease: Secondary | ICD-10-CM | POA: Diagnosis not present

## 2024-03-04 DIAGNOSIS — N2581 Secondary hyperparathyroidism of renal origin: Secondary | ICD-10-CM | POA: Diagnosis not present

## 2024-03-04 DIAGNOSIS — Z23 Encounter for immunization: Secondary | ICD-10-CM | POA: Diagnosis not present

## 2024-03-06 DIAGNOSIS — Z992 Dependence on renal dialysis: Secondary | ICD-10-CM | POA: Diagnosis not present

## 2024-03-06 DIAGNOSIS — N2581 Secondary hyperparathyroidism of renal origin: Secondary | ICD-10-CM | POA: Diagnosis not present

## 2024-03-06 DIAGNOSIS — D631 Anemia in chronic kidney disease: Secondary | ICD-10-CM | POA: Diagnosis not present

## 2024-03-06 DIAGNOSIS — N186 End stage renal disease: Secondary | ICD-10-CM | POA: Diagnosis not present

## 2024-03-06 DIAGNOSIS — Z23 Encounter for immunization: Secondary | ICD-10-CM | POA: Diagnosis not present

## 2024-03-09 DIAGNOSIS — D631 Anemia in chronic kidney disease: Secondary | ICD-10-CM | POA: Diagnosis not present

## 2024-03-09 DIAGNOSIS — E1129 Type 2 diabetes mellitus with other diabetic kidney complication: Secondary | ICD-10-CM | POA: Diagnosis not present

## 2024-03-09 DIAGNOSIS — Z992 Dependence on renal dialysis: Secondary | ICD-10-CM | POA: Diagnosis not present

## 2024-03-09 DIAGNOSIS — N2581 Secondary hyperparathyroidism of renal origin: Secondary | ICD-10-CM | POA: Diagnosis not present

## 2024-03-09 DIAGNOSIS — N186 End stage renal disease: Secondary | ICD-10-CM | POA: Diagnosis not present

## 2024-03-09 DIAGNOSIS — Z23 Encounter for immunization: Secondary | ICD-10-CM | POA: Diagnosis not present

## 2024-03-10 DIAGNOSIS — I132 Hypertensive heart and chronic kidney disease with heart failure and with stage 5 chronic kidney disease, or end stage renal disease: Secondary | ICD-10-CM | POA: Diagnosis not present

## 2024-03-10 DIAGNOSIS — N186 End stage renal disease: Secondary | ICD-10-CM | POA: Diagnosis not present

## 2024-03-10 DIAGNOSIS — I420 Dilated cardiomyopathy: Secondary | ICD-10-CM | POA: Diagnosis not present

## 2024-03-10 DIAGNOSIS — E1122 Type 2 diabetes mellitus with diabetic chronic kidney disease: Secondary | ICD-10-CM | POA: Diagnosis not present

## 2024-03-10 DIAGNOSIS — D631 Anemia in chronic kidney disease: Secondary | ICD-10-CM | POA: Diagnosis not present

## 2024-03-10 DIAGNOSIS — I509 Heart failure, unspecified: Secondary | ICD-10-CM | POA: Diagnosis not present

## 2024-03-11 DIAGNOSIS — N2581 Secondary hyperparathyroidism of renal origin: Secondary | ICD-10-CM | POA: Diagnosis not present

## 2024-03-11 DIAGNOSIS — D631 Anemia in chronic kidney disease: Secondary | ICD-10-CM | POA: Diagnosis not present

## 2024-03-11 DIAGNOSIS — N186 End stage renal disease: Secondary | ICD-10-CM | POA: Diagnosis not present

## 2024-03-11 DIAGNOSIS — Z992 Dependence on renal dialysis: Secondary | ICD-10-CM | POA: Diagnosis not present

## 2024-03-11 DIAGNOSIS — Z23 Encounter for immunization: Secondary | ICD-10-CM | POA: Diagnosis not present

## 2024-03-13 DIAGNOSIS — N2581 Secondary hyperparathyroidism of renal origin: Secondary | ICD-10-CM | POA: Diagnosis not present

## 2024-03-13 DIAGNOSIS — Z23 Encounter for immunization: Secondary | ICD-10-CM | POA: Diagnosis not present

## 2024-03-13 DIAGNOSIS — N186 End stage renal disease: Secondary | ICD-10-CM | POA: Diagnosis not present

## 2024-03-13 DIAGNOSIS — Z992 Dependence on renal dialysis: Secondary | ICD-10-CM | POA: Diagnosis not present

## 2024-03-13 DIAGNOSIS — D631 Anemia in chronic kidney disease: Secondary | ICD-10-CM | POA: Diagnosis not present

## 2024-03-16 DIAGNOSIS — N2581 Secondary hyperparathyroidism of renal origin: Secondary | ICD-10-CM | POA: Diagnosis not present

## 2024-03-16 DIAGNOSIS — N186 End stage renal disease: Secondary | ICD-10-CM | POA: Diagnosis not present

## 2024-03-16 DIAGNOSIS — Z23 Encounter for immunization: Secondary | ICD-10-CM | POA: Diagnosis not present

## 2024-03-16 DIAGNOSIS — Z992 Dependence on renal dialysis: Secondary | ICD-10-CM | POA: Diagnosis not present

## 2024-03-16 DIAGNOSIS — D631 Anemia in chronic kidney disease: Secondary | ICD-10-CM | POA: Diagnosis not present

## 2024-03-17 ENCOUNTER — Ambulatory Visit (INDEPENDENT_AMBULATORY_CARE_PROVIDER_SITE_OTHER): Admitting: Podiatry

## 2024-03-17 ENCOUNTER — Encounter: Payer: Self-pay | Admitting: Podiatry

## 2024-03-17 DIAGNOSIS — M79674 Pain in right toe(s): Secondary | ICD-10-CM | POA: Diagnosis not present

## 2024-03-17 DIAGNOSIS — B351 Tinea unguium: Secondary | ICD-10-CM

## 2024-03-17 DIAGNOSIS — I739 Peripheral vascular disease, unspecified: Secondary | ICD-10-CM | POA: Diagnosis not present

## 2024-03-17 DIAGNOSIS — M79675 Pain in left toe(s): Secondary | ICD-10-CM | POA: Diagnosis not present

## 2024-03-18 DIAGNOSIS — N186 End stage renal disease: Secondary | ICD-10-CM | POA: Diagnosis not present

## 2024-03-18 DIAGNOSIS — N2581 Secondary hyperparathyroidism of renal origin: Secondary | ICD-10-CM | POA: Diagnosis not present

## 2024-03-18 DIAGNOSIS — Z992 Dependence on renal dialysis: Secondary | ICD-10-CM | POA: Diagnosis not present

## 2024-03-18 DIAGNOSIS — Z23 Encounter for immunization: Secondary | ICD-10-CM | POA: Diagnosis not present

## 2024-03-18 DIAGNOSIS — D631 Anemia in chronic kidney disease: Secondary | ICD-10-CM | POA: Diagnosis not present

## 2024-03-19 DIAGNOSIS — I509 Heart failure, unspecified: Secondary | ICD-10-CM | POA: Diagnosis not present

## 2024-03-19 DIAGNOSIS — N186 End stage renal disease: Secondary | ICD-10-CM | POA: Diagnosis not present

## 2024-03-19 DIAGNOSIS — D631 Anemia in chronic kidney disease: Secondary | ICD-10-CM | POA: Diagnosis not present

## 2024-03-19 DIAGNOSIS — I132 Hypertensive heart and chronic kidney disease with heart failure and with stage 5 chronic kidney disease, or end stage renal disease: Secondary | ICD-10-CM | POA: Diagnosis not present

## 2024-03-19 DIAGNOSIS — I420 Dilated cardiomyopathy: Secondary | ICD-10-CM | POA: Diagnosis not present

## 2024-03-19 DIAGNOSIS — E1122 Type 2 diabetes mellitus with diabetic chronic kidney disease: Secondary | ICD-10-CM | POA: Diagnosis not present

## 2024-03-20 DIAGNOSIS — Z992 Dependence on renal dialysis: Secondary | ICD-10-CM | POA: Diagnosis not present

## 2024-03-20 DIAGNOSIS — Z23 Encounter for immunization: Secondary | ICD-10-CM | POA: Diagnosis not present

## 2024-03-20 DIAGNOSIS — N2581 Secondary hyperparathyroidism of renal origin: Secondary | ICD-10-CM | POA: Diagnosis not present

## 2024-03-20 DIAGNOSIS — N186 End stage renal disease: Secondary | ICD-10-CM | POA: Diagnosis not present

## 2024-03-20 DIAGNOSIS — D631 Anemia in chronic kidney disease: Secondary | ICD-10-CM | POA: Diagnosis not present

## 2024-03-20 DIAGNOSIS — R310 Gross hematuria: Secondary | ICD-10-CM | POA: Diagnosis not present

## 2024-03-22 NOTE — Progress Notes (Signed)
  Subjective:  Patient ID: [name], male    DOB: 1961-07-03,  MRN: [id number]  NANDAN WILLEMS presents to clinic today for at risk foot care. Patient has h/o PAD and painful, discolored, thick toenails which interfere with daily activities. He has h/o gangrene of left foot. He is accompanied by his daughter on today's visit. Wife available via phone call during the visit. Wife requesting cream (?Miracle) that was given by Dr. Janit on previous visit. Chief Complaint  Patient presents with   RFC     RFC Non diabetic toenail trim. LOV with PCP 02/2024   PCP is [facility].  Allergies  Allergen Reactions   Heparin  Nausea Only, Swelling and Other (See Comments)    * * HIT * *  SWELLING REACTION UNSPECIFIED   DIAPHORESIS    Iodinated Contrast Media     Unknown reaction   Penicillin G Potassium [Penicillin G] Nausea Only and Other (See Comments)    DIAPHORESIS High Doses    Review of Systems: Negative except as noted in the HPI.  Objective: No changes noted in today's physical examination. There were no vitals filed for this visit. FREDDIE DYMEK is a pleasant 63 y.o. male in NAD. AAO x 3.  Vascular Examination: CFT <4 seconds b/l. DP pulses diminished b/l. PT pulses diminished b/l. Digital hair absent. Skin temperature gradient warm to cool b/l. No ischemia or gangrene. No cyanosis or clubbing noted b/l. Trace edema noted BLE.   Neurological Examination:   Normal speech. Oriented x 3..  Dermatological Examination: Dried blood blister lateral aspect of left 5th digit. No open wounds. No interdigital macerations.   Toenails 1-5 b/l thick, discolored, elongated with subungual debris and pain on dorsal palpation.   No hyperkeratotic nor porokeratotic lesions.  Musculoskeletal Examination: No pain, crepitus or joint limitation noted with ROM bilateral LE. Utilizes wheelchair for mobility assistance.  Radiographs: None  Last A1c:      Latest Ref Rng & Units  09/12/2023   11:50 PM  Hemoglobin A1C  Hemoglobin-A1c 4.8 - 5.6 % 5.1    Assessment/Plan: 1. Pain due to onychomycosis of toenails of both feet   2. Peripheral vascular disease [facility])     Consent given for treatment. Patient examined. All patient's and/or POA's questions/concerns addressed on today's visit. Informed patient's family regarding cream is Foot Miracle Cream to be applied once daily. Toenails 1-5 debrided in length and girth without incident. Continue soft, supportive shoe gear daily. Report any pedal injuries to medical professional. Call office if there are any questions/concerns.  Return in about 3 months (around 06/17/2024).  Delon LITTIE Merlin, DPM      [place] LOCATION: [address], [place] [address]                   Office [phone]   [facility] LOCATION: [address] [place], [place] [address] Office [phone]

## 2024-03-23 DIAGNOSIS — Z992 Dependence on renal dialysis: Secondary | ICD-10-CM | POA: Diagnosis not present

## 2024-03-23 DIAGNOSIS — Z23 Encounter for immunization: Secondary | ICD-10-CM | POA: Diagnosis not present

## 2024-03-23 DIAGNOSIS — N186 End stage renal disease: Secondary | ICD-10-CM | POA: Diagnosis not present

## 2024-03-23 DIAGNOSIS — D631 Anemia in chronic kidney disease: Secondary | ICD-10-CM | POA: Diagnosis not present

## 2024-03-23 DIAGNOSIS — N2581 Secondary hyperparathyroidism of renal origin: Secondary | ICD-10-CM | POA: Diagnosis not present

## 2024-03-25 DIAGNOSIS — N2581 Secondary hyperparathyroidism of renal origin: Secondary | ICD-10-CM | POA: Diagnosis not present

## 2024-03-25 DIAGNOSIS — Z23 Encounter for immunization: Secondary | ICD-10-CM | POA: Diagnosis not present

## 2024-03-25 DIAGNOSIS — Z992 Dependence on renal dialysis: Secondary | ICD-10-CM | POA: Diagnosis not present

## 2024-03-25 DIAGNOSIS — D631 Anemia in chronic kidney disease: Secondary | ICD-10-CM | POA: Diagnosis not present

## 2024-03-25 DIAGNOSIS — N186 End stage renal disease: Secondary | ICD-10-CM | POA: Diagnosis not present

## 2024-03-26 DIAGNOSIS — N186 End stage renal disease: Secondary | ICD-10-CM | POA: Diagnosis not present

## 2024-03-26 DIAGNOSIS — Z992 Dependence on renal dialysis: Secondary | ICD-10-CM | POA: Diagnosis not present

## 2024-03-26 DIAGNOSIS — E1122 Type 2 diabetes mellitus with diabetic chronic kidney disease: Secondary | ICD-10-CM | POA: Diagnosis not present

## 2024-03-26 DIAGNOSIS — I951 Orthostatic hypotension: Secondary | ICD-10-CM | POA: Diagnosis not present

## 2024-03-26 DIAGNOSIS — E1151 Type 2 diabetes mellitus with diabetic peripheral angiopathy without gangrene: Secondary | ICD-10-CM | POA: Diagnosis not present

## 2024-03-26 DIAGNOSIS — I420 Dilated cardiomyopathy: Secondary | ICD-10-CM | POA: Diagnosis not present

## 2024-03-26 DIAGNOSIS — D6859 Other primary thrombophilia: Secondary | ICD-10-CM | POA: Diagnosis not present

## 2024-03-26 DIAGNOSIS — I4891 Unspecified atrial fibrillation: Secondary | ICD-10-CM | POA: Diagnosis not present

## 2024-03-26 DIAGNOSIS — M159 Polyosteoarthritis, unspecified: Secondary | ICD-10-CM | POA: Diagnosis not present

## 2024-03-26 DIAGNOSIS — D631 Anemia in chronic kidney disease: Secondary | ICD-10-CM | POA: Diagnosis not present

## 2024-03-26 DIAGNOSIS — I509 Heart failure, unspecified: Secondary | ICD-10-CM | POA: Diagnosis not present

## 2024-03-26 DIAGNOSIS — E1142 Type 2 diabetes mellitus with diabetic polyneuropathy: Secondary | ICD-10-CM | POA: Diagnosis not present

## 2024-03-26 DIAGNOSIS — G20C Parkinsonism, unspecified: Secondary | ICD-10-CM | POA: Diagnosis not present

## 2024-03-26 DIAGNOSIS — I2510 Atherosclerotic heart disease of native coronary artery without angina pectoris: Secondary | ICD-10-CM | POA: Diagnosis not present

## 2024-03-26 DIAGNOSIS — I132 Hypertensive heart and chronic kidney disease with heart failure and with stage 5 chronic kidney disease, or end stage renal disease: Secondary | ICD-10-CM | POA: Diagnosis not present

## 2024-03-26 DIAGNOSIS — Z7901 Long term (current) use of anticoagulants: Secondary | ICD-10-CM | POA: Diagnosis not present

## 2024-03-26 DIAGNOSIS — F331 Major depressive disorder, recurrent, moderate: Secondary | ICD-10-CM | POA: Diagnosis not present

## 2024-03-26 DIAGNOSIS — G4733 Obstructive sleep apnea (adult) (pediatric): Secondary | ICD-10-CM | POA: Diagnosis not present

## 2024-03-26 DIAGNOSIS — F0283 Dementia in other diseases classified elsewhere, unspecified severity, with mood disturbance: Secondary | ICD-10-CM | POA: Diagnosis not present

## 2024-03-26 DIAGNOSIS — E7800 Pure hypercholesterolemia, unspecified: Secondary | ICD-10-CM | POA: Diagnosis not present

## 2024-03-26 DIAGNOSIS — F02818 Dementia in other diseases classified elsewhere, unspecified severity, with other behavioral disturbance: Secondary | ICD-10-CM | POA: Diagnosis not present

## 2024-03-26 DIAGNOSIS — Z6833 Body mass index (BMI) 33.0-33.9, adult: Secondary | ICD-10-CM | POA: Diagnosis not present

## 2024-03-26 DIAGNOSIS — G894 Chronic pain syndrome: Secondary | ICD-10-CM | POA: Diagnosis not present

## 2024-03-27 DIAGNOSIS — E1122 Type 2 diabetes mellitus with diabetic chronic kidney disease: Secondary | ICD-10-CM | POA: Diagnosis not present

## 2024-03-27 DIAGNOSIS — N2581 Secondary hyperparathyroidism of renal origin: Secondary | ICD-10-CM | POA: Diagnosis not present

## 2024-03-27 DIAGNOSIS — I420 Dilated cardiomyopathy: Secondary | ICD-10-CM | POA: Diagnosis not present

## 2024-03-27 DIAGNOSIS — N186 End stage renal disease: Secondary | ICD-10-CM | POA: Diagnosis not present

## 2024-03-27 DIAGNOSIS — E1129 Type 2 diabetes mellitus with other diabetic kidney complication: Secondary | ICD-10-CM | POA: Diagnosis not present

## 2024-03-27 DIAGNOSIS — Z992 Dependence on renal dialysis: Secondary | ICD-10-CM | POA: Diagnosis not present

## 2024-03-27 DIAGNOSIS — D509 Iron deficiency anemia, unspecified: Secondary | ICD-10-CM | POA: Diagnosis not present

## 2024-03-27 DIAGNOSIS — D631 Anemia in chronic kidney disease: Secondary | ICD-10-CM | POA: Diagnosis not present

## 2024-03-27 DIAGNOSIS — I509 Heart failure, unspecified: Secondary | ICD-10-CM | POA: Diagnosis not present

## 2024-03-27 DIAGNOSIS — F02C0 Dementia in other diseases classified elsewhere, severe, without behavioral disturbance, psychotic disturbance, mood disturbance, and anxiety: Secondary | ICD-10-CM | POA: Diagnosis not present

## 2024-03-27 DIAGNOSIS — T8620 Unspecified complication of heart transplant: Secondary | ICD-10-CM | POA: Diagnosis not present

## 2024-03-27 DIAGNOSIS — I132 Hypertensive heart and chronic kidney disease with heart failure and with stage 5 chronic kidney disease, or end stage renal disease: Secondary | ICD-10-CM | POA: Diagnosis not present

## 2024-03-30 DIAGNOSIS — E1129 Type 2 diabetes mellitus with other diabetic kidney complication: Secondary | ICD-10-CM | POA: Diagnosis not present

## 2024-03-30 DIAGNOSIS — D631 Anemia in chronic kidney disease: Secondary | ICD-10-CM | POA: Diagnosis not present

## 2024-03-30 DIAGNOSIS — N2581 Secondary hyperparathyroidism of renal origin: Secondary | ICD-10-CM | POA: Diagnosis not present

## 2024-03-30 DIAGNOSIS — Z992 Dependence on renal dialysis: Secondary | ICD-10-CM | POA: Diagnosis not present

## 2024-03-30 DIAGNOSIS — N186 End stage renal disease: Secondary | ICD-10-CM | POA: Diagnosis not present

## 2024-03-30 DIAGNOSIS — D509 Iron deficiency anemia, unspecified: Secondary | ICD-10-CM | POA: Diagnosis not present

## 2024-03-31 DIAGNOSIS — E1122 Type 2 diabetes mellitus with diabetic chronic kidney disease: Secondary | ICD-10-CM | POA: Diagnosis not present

## 2024-03-31 DIAGNOSIS — I420 Dilated cardiomyopathy: Secondary | ICD-10-CM | POA: Diagnosis not present

## 2024-03-31 DIAGNOSIS — I509 Heart failure, unspecified: Secondary | ICD-10-CM | POA: Diagnosis not present

## 2024-03-31 DIAGNOSIS — N186 End stage renal disease: Secondary | ICD-10-CM | POA: Diagnosis not present

## 2024-03-31 DIAGNOSIS — D631 Anemia in chronic kidney disease: Secondary | ICD-10-CM | POA: Diagnosis not present

## 2024-03-31 DIAGNOSIS — I132 Hypertensive heart and chronic kidney disease with heart failure and with stage 5 chronic kidney disease, or end stage renal disease: Secondary | ICD-10-CM | POA: Diagnosis not present

## 2024-04-01 DIAGNOSIS — D509 Iron deficiency anemia, unspecified: Secondary | ICD-10-CM | POA: Diagnosis not present

## 2024-04-01 DIAGNOSIS — Z992 Dependence on renal dialysis: Secondary | ICD-10-CM | POA: Diagnosis not present

## 2024-04-01 DIAGNOSIS — N2581 Secondary hyperparathyroidism of renal origin: Secondary | ICD-10-CM | POA: Diagnosis not present

## 2024-04-01 DIAGNOSIS — D631 Anemia in chronic kidney disease: Secondary | ICD-10-CM | POA: Diagnosis not present

## 2024-04-01 DIAGNOSIS — N186 End stage renal disease: Secondary | ICD-10-CM | POA: Diagnosis not present

## 2024-04-01 DIAGNOSIS — E1129 Type 2 diabetes mellitus with other diabetic kidney complication: Secondary | ICD-10-CM | POA: Diagnosis not present

## 2024-04-03 DIAGNOSIS — E1129 Type 2 diabetes mellitus with other diabetic kidney complication: Secondary | ICD-10-CM | POA: Diagnosis not present

## 2024-04-03 DIAGNOSIS — N2581 Secondary hyperparathyroidism of renal origin: Secondary | ICD-10-CM | POA: Diagnosis not present

## 2024-04-03 DIAGNOSIS — N186 End stage renal disease: Secondary | ICD-10-CM | POA: Diagnosis not present

## 2024-04-03 DIAGNOSIS — Z992 Dependence on renal dialysis: Secondary | ICD-10-CM | POA: Diagnosis not present

## 2024-04-03 DIAGNOSIS — D509 Iron deficiency anemia, unspecified: Secondary | ICD-10-CM | POA: Diagnosis not present

## 2024-04-03 DIAGNOSIS — D631 Anemia in chronic kidney disease: Secondary | ICD-10-CM | POA: Diagnosis not present

## 2024-04-06 DIAGNOSIS — N2581 Secondary hyperparathyroidism of renal origin: Secondary | ICD-10-CM | POA: Diagnosis not present

## 2024-04-06 DIAGNOSIS — D509 Iron deficiency anemia, unspecified: Secondary | ICD-10-CM | POA: Diagnosis not present

## 2024-04-06 DIAGNOSIS — Z992 Dependence on renal dialysis: Secondary | ICD-10-CM | POA: Diagnosis not present

## 2024-04-06 DIAGNOSIS — D631 Anemia in chronic kidney disease: Secondary | ICD-10-CM | POA: Diagnosis not present

## 2024-04-06 DIAGNOSIS — N186 End stage renal disease: Secondary | ICD-10-CM | POA: Diagnosis not present

## 2024-04-06 DIAGNOSIS — E1129 Type 2 diabetes mellitus with other diabetic kidney complication: Secondary | ICD-10-CM | POA: Diagnosis not present

## 2024-04-08 DIAGNOSIS — D509 Iron deficiency anemia, unspecified: Secondary | ICD-10-CM | POA: Diagnosis not present

## 2024-04-08 DIAGNOSIS — E1129 Type 2 diabetes mellitus with other diabetic kidney complication: Secondary | ICD-10-CM | POA: Diagnosis not present

## 2024-04-08 DIAGNOSIS — Z992 Dependence on renal dialysis: Secondary | ICD-10-CM | POA: Diagnosis not present

## 2024-04-08 DIAGNOSIS — N186 End stage renal disease: Secondary | ICD-10-CM | POA: Diagnosis not present

## 2024-04-08 DIAGNOSIS — D631 Anemia in chronic kidney disease: Secondary | ICD-10-CM | POA: Diagnosis not present

## 2024-04-08 DIAGNOSIS — N2581 Secondary hyperparathyroidism of renal origin: Secondary | ICD-10-CM | POA: Diagnosis not present

## 2024-04-09 DIAGNOSIS — N186 End stage renal disease: Secondary | ICD-10-CM | POA: Diagnosis not present

## 2024-04-09 DIAGNOSIS — I509 Heart failure, unspecified: Secondary | ICD-10-CM | POA: Diagnosis not present

## 2024-04-09 DIAGNOSIS — E1122 Type 2 diabetes mellitus with diabetic chronic kidney disease: Secondary | ICD-10-CM | POA: Diagnosis not present

## 2024-04-09 DIAGNOSIS — I420 Dilated cardiomyopathy: Secondary | ICD-10-CM | POA: Diagnosis not present

## 2024-04-09 DIAGNOSIS — I132 Hypertensive heart and chronic kidney disease with heart failure and with stage 5 chronic kidney disease, or end stage renal disease: Secondary | ICD-10-CM | POA: Diagnosis not present

## 2024-04-09 DIAGNOSIS — D631 Anemia in chronic kidney disease: Secondary | ICD-10-CM | POA: Diagnosis not present

## 2024-04-10 DIAGNOSIS — E1129 Type 2 diabetes mellitus with other diabetic kidney complication: Secondary | ICD-10-CM | POA: Diagnosis not present

## 2024-04-10 DIAGNOSIS — D631 Anemia in chronic kidney disease: Secondary | ICD-10-CM | POA: Diagnosis not present

## 2024-04-10 DIAGNOSIS — D509 Iron deficiency anemia, unspecified: Secondary | ICD-10-CM | POA: Diagnosis not present

## 2024-04-10 DIAGNOSIS — Z992 Dependence on renal dialysis: Secondary | ICD-10-CM | POA: Diagnosis not present

## 2024-04-10 DIAGNOSIS — N186 End stage renal disease: Secondary | ICD-10-CM | POA: Diagnosis not present

## 2024-04-10 DIAGNOSIS — N2581 Secondary hyperparathyroidism of renal origin: Secondary | ICD-10-CM | POA: Diagnosis not present

## 2024-04-13 DIAGNOSIS — E1129 Type 2 diabetes mellitus with other diabetic kidney complication: Secondary | ICD-10-CM | POA: Diagnosis not present

## 2024-04-13 DIAGNOSIS — N186 End stage renal disease: Secondary | ICD-10-CM | POA: Diagnosis not present

## 2024-04-13 DIAGNOSIS — Z992 Dependence on renal dialysis: Secondary | ICD-10-CM | POA: Diagnosis not present

## 2024-04-13 DIAGNOSIS — D631 Anemia in chronic kidney disease: Secondary | ICD-10-CM | POA: Diagnosis not present

## 2024-04-13 DIAGNOSIS — N2581 Secondary hyperparathyroidism of renal origin: Secondary | ICD-10-CM | POA: Diagnosis not present

## 2024-04-13 DIAGNOSIS — D509 Iron deficiency anemia, unspecified: Secondary | ICD-10-CM | POA: Diagnosis not present

## 2024-04-14 DIAGNOSIS — D631 Anemia in chronic kidney disease: Secondary | ICD-10-CM | POA: Diagnosis not present

## 2024-04-14 DIAGNOSIS — I132 Hypertensive heart and chronic kidney disease with heart failure and with stage 5 chronic kidney disease, or end stage renal disease: Secondary | ICD-10-CM | POA: Diagnosis not present

## 2024-04-14 DIAGNOSIS — N186 End stage renal disease: Secondary | ICD-10-CM | POA: Diagnosis not present

## 2024-04-14 DIAGNOSIS — E1122 Type 2 diabetes mellitus with diabetic chronic kidney disease: Secondary | ICD-10-CM | POA: Diagnosis not present

## 2024-04-14 DIAGNOSIS — I420 Dilated cardiomyopathy: Secondary | ICD-10-CM | POA: Diagnosis not present

## 2024-04-14 DIAGNOSIS — I509 Heart failure, unspecified: Secondary | ICD-10-CM | POA: Diagnosis not present

## 2024-04-15 ENCOUNTER — Telehealth: Payer: Self-pay

## 2024-04-15 DIAGNOSIS — D631 Anemia in chronic kidney disease: Secondary | ICD-10-CM | POA: Diagnosis not present

## 2024-04-15 DIAGNOSIS — Z992 Dependence on renal dialysis: Secondary | ICD-10-CM | POA: Diagnosis not present

## 2024-04-15 DIAGNOSIS — N186 End stage renal disease: Secondary | ICD-10-CM | POA: Diagnosis not present

## 2024-04-15 DIAGNOSIS — N2581 Secondary hyperparathyroidism of renal origin: Secondary | ICD-10-CM | POA: Diagnosis not present

## 2024-04-15 DIAGNOSIS — D509 Iron deficiency anemia, unspecified: Secondary | ICD-10-CM | POA: Diagnosis not present

## 2024-04-15 DIAGNOSIS — E1129 Type 2 diabetes mellitus with other diabetic kidney complication: Secondary | ICD-10-CM | POA: Diagnosis not present

## 2024-04-15 NOTE — Telephone Encounter (Signed)
 The dialysis clinic was contacted to gather information regarding today's HD treatment and it was found that the patient does not use his fistula for dialysis but has been c/o swelling.   The staff at the clinic [facility]) said they would follow up with the patient's nephrologist and the patient regarding next steps and send referral if ordered.  The above was communicated to Mrs. Maruyama as well as the need to seek ER intervention if the pain worsens, limb changes color or temperature or if any bleeding occurs.  Mrs. Haigler verbalized understanding of above.

## 2024-04-15 NOTE — Telephone Encounter (Signed)
 [name] called to report her husband's RUA  which has an AVF was swollen and very painful.

## 2024-04-17 ENCOUNTER — Telehealth: Payer: Self-pay

## 2024-04-17 DIAGNOSIS — E1129 Type 2 diabetes mellitus with other diabetic kidney complication: Secondary | ICD-10-CM | POA: Diagnosis not present

## 2024-04-17 DIAGNOSIS — N2581 Secondary hyperparathyroidism of renal origin: Secondary | ICD-10-CM | POA: Diagnosis not present

## 2024-04-17 DIAGNOSIS — D509 Iron deficiency anemia, unspecified: Secondary | ICD-10-CM | POA: Diagnosis not present

## 2024-04-17 DIAGNOSIS — Z992 Dependence on renal dialysis: Secondary | ICD-10-CM | POA: Diagnosis not present

## 2024-04-17 DIAGNOSIS — D631 Anemia in chronic kidney disease: Secondary | ICD-10-CM | POA: Diagnosis not present

## 2024-04-17 DIAGNOSIS — N186 End stage renal disease: Secondary | ICD-10-CM | POA: Diagnosis not present

## 2024-04-17 NOTE — Telephone Encounter (Signed)
 [facility] was contacted regarding referral to [facility] and staff stated a referral was ordered but no one was there to enter the order until 04-20-24.  [name] was contacted with this information.

## 2024-04-20 DIAGNOSIS — N2581 Secondary hyperparathyroidism of renal origin: Secondary | ICD-10-CM | POA: Diagnosis not present

## 2024-04-20 DIAGNOSIS — D631 Anemia in chronic kidney disease: Secondary | ICD-10-CM | POA: Diagnosis not present

## 2024-04-20 DIAGNOSIS — E1129 Type 2 diabetes mellitus with other diabetic kidney complication: Secondary | ICD-10-CM | POA: Diagnosis not present

## 2024-04-20 DIAGNOSIS — N186 End stage renal disease: Secondary | ICD-10-CM | POA: Diagnosis not present

## 2024-04-20 DIAGNOSIS — D509 Iron deficiency anemia, unspecified: Secondary | ICD-10-CM | POA: Diagnosis not present

## 2024-04-20 DIAGNOSIS — Z992 Dependence on renal dialysis: Secondary | ICD-10-CM | POA: Diagnosis not present

## 2024-04-21 DIAGNOSIS — I132 Hypertensive heart and chronic kidney disease with heart failure and with stage 5 chronic kidney disease, or end stage renal disease: Secondary | ICD-10-CM | POA: Diagnosis not present

## 2024-04-21 DIAGNOSIS — N186 End stage renal disease: Secondary | ICD-10-CM | POA: Diagnosis not present

## 2024-04-21 DIAGNOSIS — E1122 Type 2 diabetes mellitus with diabetic chronic kidney disease: Secondary | ICD-10-CM | POA: Diagnosis not present

## 2024-04-21 DIAGNOSIS — D631 Anemia in chronic kidney disease: Secondary | ICD-10-CM | POA: Diagnosis not present

## 2024-04-21 DIAGNOSIS — I420 Dilated cardiomyopathy: Secondary | ICD-10-CM | POA: Diagnosis not present

## 2024-04-21 DIAGNOSIS — I509 Heart failure, unspecified: Secondary | ICD-10-CM | POA: Diagnosis not present

## 2024-04-22 DIAGNOSIS — N186 End stage renal disease: Secondary | ICD-10-CM | POA: Diagnosis not present

## 2024-04-22 DIAGNOSIS — N2581 Secondary hyperparathyroidism of renal origin: Secondary | ICD-10-CM | POA: Diagnosis not present

## 2024-04-22 DIAGNOSIS — D631 Anemia in chronic kidney disease: Secondary | ICD-10-CM | POA: Diagnosis not present

## 2024-04-22 DIAGNOSIS — E1129 Type 2 diabetes mellitus with other diabetic kidney complication: Secondary | ICD-10-CM | POA: Diagnosis not present

## 2024-04-22 DIAGNOSIS — D509 Iron deficiency anemia, unspecified: Secondary | ICD-10-CM | POA: Diagnosis not present

## 2024-04-22 DIAGNOSIS — Z992 Dependence on renal dialysis: Secondary | ICD-10-CM | POA: Diagnosis not present

## 2024-04-23 DIAGNOSIS — Z7409 Other reduced mobility: Secondary | ICD-10-CM | POA: Diagnosis not present

## 2024-04-23 DIAGNOSIS — R251 Tremor, unspecified: Secondary | ICD-10-CM | POA: Diagnosis not present

## 2024-04-23 DIAGNOSIS — Z1389 Encounter for screening for other disorder: Secondary | ICD-10-CM | POA: Diagnosis not present

## 2024-04-23 DIAGNOSIS — I132 Hypertensive heart and chronic kidney disease with heart failure and with stage 5 chronic kidney disease, or end stage renal disease: Secondary | ICD-10-CM | POA: Diagnosis not present

## 2024-04-23 DIAGNOSIS — N186 End stage renal disease: Secondary | ICD-10-CM | POA: Diagnosis not present

## 2024-04-23 DIAGNOSIS — F32A Depression, unspecified: Secondary | ICD-10-CM | POA: Diagnosis not present

## 2024-04-23 DIAGNOSIS — I4891 Unspecified atrial fibrillation: Secondary | ICD-10-CM | POA: Diagnosis not present

## 2024-04-23 DIAGNOSIS — D6869 Other thrombophilia: Secondary | ICD-10-CM | POA: Diagnosis not present

## 2024-04-23 DIAGNOSIS — I5032 Chronic diastolic (congestive) heart failure: Secondary | ICD-10-CM | POA: Diagnosis not present

## 2024-04-23 DIAGNOSIS — Z992 Dependence on renal dialysis: Secondary | ICD-10-CM | POA: Diagnosis not present

## 2024-04-24 DIAGNOSIS — D631 Anemia in chronic kidney disease: Secondary | ICD-10-CM | POA: Diagnosis not present

## 2024-04-24 DIAGNOSIS — E1129 Type 2 diabetes mellitus with other diabetic kidney complication: Secondary | ICD-10-CM | POA: Diagnosis not present

## 2024-04-24 DIAGNOSIS — N2581 Secondary hyperparathyroidism of renal origin: Secondary | ICD-10-CM | POA: Diagnosis not present

## 2024-04-24 DIAGNOSIS — Z992 Dependence on renal dialysis: Secondary | ICD-10-CM | POA: Diagnosis not present

## 2024-04-24 DIAGNOSIS — N186 End stage renal disease: Secondary | ICD-10-CM | POA: Diagnosis not present

## 2024-04-24 DIAGNOSIS — D509 Iron deficiency anemia, unspecified: Secondary | ICD-10-CM | POA: Diagnosis not present

## 2024-04-25 DIAGNOSIS — F0283 Dementia in other diseases classified elsewhere, unspecified severity, with mood disturbance: Secondary | ICD-10-CM | POA: Diagnosis not present

## 2024-04-25 DIAGNOSIS — I132 Hypertensive heart and chronic kidney disease with heart failure and with stage 5 chronic kidney disease, or end stage renal disease: Secondary | ICD-10-CM | POA: Diagnosis not present

## 2024-04-25 DIAGNOSIS — F331 Major depressive disorder, recurrent, moderate: Secondary | ICD-10-CM | POA: Diagnosis not present

## 2024-04-25 DIAGNOSIS — I2510 Atherosclerotic heart disease of native coronary artery without angina pectoris: Secondary | ICD-10-CM | POA: Diagnosis not present

## 2024-04-25 DIAGNOSIS — Z6833 Body mass index (BMI) 33.0-33.9, adult: Secondary | ICD-10-CM | POA: Diagnosis not present

## 2024-04-25 DIAGNOSIS — D6859 Other primary thrombophilia: Secondary | ICD-10-CM | POA: Diagnosis not present

## 2024-04-25 DIAGNOSIS — M159 Polyosteoarthritis, unspecified: Secondary | ICD-10-CM | POA: Diagnosis not present

## 2024-04-25 DIAGNOSIS — E7800 Pure hypercholesterolemia, unspecified: Secondary | ICD-10-CM | POA: Diagnosis not present

## 2024-04-25 DIAGNOSIS — E1122 Type 2 diabetes mellitus with diabetic chronic kidney disease: Secondary | ICD-10-CM | POA: Diagnosis not present

## 2024-04-25 DIAGNOSIS — Z7901 Long term (current) use of anticoagulants: Secondary | ICD-10-CM | POA: Diagnosis not present

## 2024-04-25 DIAGNOSIS — D631 Anemia in chronic kidney disease: Secondary | ICD-10-CM | POA: Diagnosis not present

## 2024-04-25 DIAGNOSIS — I951 Orthostatic hypotension: Secondary | ICD-10-CM | POA: Diagnosis not present

## 2024-04-25 DIAGNOSIS — E1151 Type 2 diabetes mellitus with diabetic peripheral angiopathy without gangrene: Secondary | ICD-10-CM | POA: Diagnosis not present

## 2024-04-25 DIAGNOSIS — E1142 Type 2 diabetes mellitus with diabetic polyneuropathy: Secondary | ICD-10-CM | POA: Diagnosis not present

## 2024-04-25 DIAGNOSIS — F02818 Dementia in other diseases classified elsewhere, unspecified severity, with other behavioral disturbance: Secondary | ICD-10-CM | POA: Diagnosis not present

## 2024-04-25 DIAGNOSIS — Z992 Dependence on renal dialysis: Secondary | ICD-10-CM | POA: Diagnosis not present

## 2024-04-25 DIAGNOSIS — G894 Chronic pain syndrome: Secondary | ICD-10-CM | POA: Diagnosis not present

## 2024-04-25 DIAGNOSIS — G20C Parkinsonism, unspecified: Secondary | ICD-10-CM | POA: Diagnosis not present

## 2024-04-25 DIAGNOSIS — G4733 Obstructive sleep apnea (adult) (pediatric): Secondary | ICD-10-CM | POA: Diagnosis not present

## 2024-04-25 DIAGNOSIS — I509 Heart failure, unspecified: Secondary | ICD-10-CM | POA: Diagnosis not present

## 2024-04-25 DIAGNOSIS — N186 End stage renal disease: Secondary | ICD-10-CM | POA: Diagnosis not present

## 2024-04-25 DIAGNOSIS — I4891 Unspecified atrial fibrillation: Secondary | ICD-10-CM | POA: Diagnosis not present

## 2024-04-25 DIAGNOSIS — I420 Dilated cardiomyopathy: Secondary | ICD-10-CM | POA: Diagnosis not present

## 2024-04-27 DIAGNOSIS — N2581 Secondary hyperparathyroidism of renal origin: Secondary | ICD-10-CM | POA: Diagnosis not present

## 2024-04-27 DIAGNOSIS — T782XXA Anaphylactic shock, unspecified, initial encounter: Secondary | ICD-10-CM | POA: Diagnosis not present

## 2024-04-27 DIAGNOSIS — N186 End stage renal disease: Secondary | ICD-10-CM | POA: Diagnosis not present

## 2024-04-27 DIAGNOSIS — E1129 Type 2 diabetes mellitus with other diabetic kidney complication: Secondary | ICD-10-CM | POA: Diagnosis not present

## 2024-04-27 DIAGNOSIS — Z992 Dependence on renal dialysis: Secondary | ICD-10-CM | POA: Diagnosis not present

## 2024-04-27 DIAGNOSIS — D509 Iron deficiency anemia, unspecified: Secondary | ICD-10-CM | POA: Diagnosis not present

## 2024-04-27 DIAGNOSIS — T8620 Unspecified complication of heart transplant: Secondary | ICD-10-CM | POA: Diagnosis not present

## 2024-04-27 DIAGNOSIS — R519 Headache, unspecified: Secondary | ICD-10-CM | POA: Diagnosis not present

## 2024-04-27 DIAGNOSIS — F02C0 Dementia in other diseases classified elsewhere, severe, without behavioral disturbance, psychotic disturbance, mood disturbance, and anxiety: Secondary | ICD-10-CM | POA: Diagnosis not present

## 2024-04-27 DIAGNOSIS — D631 Anemia in chronic kidney disease: Secondary | ICD-10-CM | POA: Diagnosis not present

## 2024-04-29 DIAGNOSIS — T782XXA Anaphylactic shock, unspecified, initial encounter: Secondary | ICD-10-CM | POA: Diagnosis not present

## 2024-04-29 DIAGNOSIS — Z992 Dependence on renal dialysis: Secondary | ICD-10-CM | POA: Diagnosis not present

## 2024-04-29 DIAGNOSIS — N2581 Secondary hyperparathyroidism of renal origin: Secondary | ICD-10-CM | POA: Diagnosis not present

## 2024-04-29 DIAGNOSIS — R519 Headache, unspecified: Secondary | ICD-10-CM | POA: Diagnosis not present

## 2024-04-29 DIAGNOSIS — D631 Anemia in chronic kidney disease: Secondary | ICD-10-CM | POA: Diagnosis not present

## 2024-04-29 DIAGNOSIS — N186 End stage renal disease: Secondary | ICD-10-CM | POA: Diagnosis not present

## 2024-05-01 ENCOUNTER — Telehealth: Payer: Self-pay

## 2024-05-01 DIAGNOSIS — N2581 Secondary hyperparathyroidism of renal origin: Secondary | ICD-10-CM | POA: Diagnosis not present

## 2024-05-01 DIAGNOSIS — T782XXA Anaphylactic shock, unspecified, initial encounter: Secondary | ICD-10-CM | POA: Diagnosis not present

## 2024-05-01 DIAGNOSIS — D631 Anemia in chronic kidney disease: Secondary | ICD-10-CM | POA: Diagnosis not present

## 2024-05-01 DIAGNOSIS — R519 Headache, unspecified: Secondary | ICD-10-CM | POA: Diagnosis not present

## 2024-05-01 DIAGNOSIS — Z992 Dependence on renal dialysis: Secondary | ICD-10-CM | POA: Diagnosis not present

## 2024-05-01 DIAGNOSIS — N186 End stage renal disease: Secondary | ICD-10-CM | POA: Diagnosis not present

## 2024-05-01 NOTE — Telephone Encounter (Signed)
 Pt's wife, Jery Hollern, called to report pt's AVF is painful and more pronounced than usual.  The fistula is not being used for HD.  [name] knows to call pt's dialysis center or Dr. Gearline for a referral.

## 2024-05-04 DIAGNOSIS — N2581 Secondary hyperparathyroidism of renal origin: Secondary | ICD-10-CM | POA: Diagnosis not present

## 2024-05-04 DIAGNOSIS — Z992 Dependence on renal dialysis: Secondary | ICD-10-CM | POA: Diagnosis not present

## 2024-05-04 DIAGNOSIS — D631 Anemia in chronic kidney disease: Secondary | ICD-10-CM | POA: Diagnosis not present

## 2024-05-04 DIAGNOSIS — R519 Headache, unspecified: Secondary | ICD-10-CM | POA: Diagnosis not present

## 2024-05-04 DIAGNOSIS — T782XXA Anaphylactic shock, unspecified, initial encounter: Secondary | ICD-10-CM | POA: Diagnosis not present

## 2024-05-04 DIAGNOSIS — N186 End stage renal disease: Secondary | ICD-10-CM | POA: Diagnosis not present

## 2024-05-05 DIAGNOSIS — I509 Heart failure, unspecified: Secondary | ICD-10-CM | POA: Diagnosis not present

## 2024-05-05 DIAGNOSIS — D631 Anemia in chronic kidney disease: Secondary | ICD-10-CM | POA: Diagnosis not present

## 2024-05-05 DIAGNOSIS — E1122 Type 2 diabetes mellitus with diabetic chronic kidney disease: Secondary | ICD-10-CM | POA: Diagnosis not present

## 2024-05-05 DIAGNOSIS — I132 Hypertensive heart and chronic kidney disease with heart failure and with stage 5 chronic kidney disease, or end stage renal disease: Secondary | ICD-10-CM | POA: Diagnosis not present

## 2024-05-05 DIAGNOSIS — I420 Dilated cardiomyopathy: Secondary | ICD-10-CM | POA: Diagnosis not present

## 2024-05-05 DIAGNOSIS — N186 End stage renal disease: Secondary | ICD-10-CM | POA: Diagnosis not present

## 2024-05-06 DIAGNOSIS — N186 End stage renal disease: Secondary | ICD-10-CM | POA: Diagnosis not present

## 2024-05-06 DIAGNOSIS — N2581 Secondary hyperparathyroidism of renal origin: Secondary | ICD-10-CM | POA: Diagnosis not present

## 2024-05-06 DIAGNOSIS — Z992 Dependence on renal dialysis: Secondary | ICD-10-CM | POA: Diagnosis not present

## 2024-05-06 DIAGNOSIS — T782XXA Anaphylactic shock, unspecified, initial encounter: Secondary | ICD-10-CM | POA: Diagnosis not present

## 2024-05-06 DIAGNOSIS — D631 Anemia in chronic kidney disease: Secondary | ICD-10-CM | POA: Diagnosis not present

## 2024-05-06 DIAGNOSIS — R519 Headache, unspecified: Secondary | ICD-10-CM | POA: Diagnosis not present

## 2024-05-08 DIAGNOSIS — D631 Anemia in chronic kidney disease: Secondary | ICD-10-CM | POA: Diagnosis not present

## 2024-05-08 DIAGNOSIS — N186 End stage renal disease: Secondary | ICD-10-CM | POA: Diagnosis not present

## 2024-05-08 DIAGNOSIS — R519 Headache, unspecified: Secondary | ICD-10-CM | POA: Diagnosis not present

## 2024-05-08 DIAGNOSIS — N2581 Secondary hyperparathyroidism of renal origin: Secondary | ICD-10-CM | POA: Diagnosis not present

## 2024-05-08 DIAGNOSIS — Z992 Dependence on renal dialysis: Secondary | ICD-10-CM | POA: Diagnosis not present

## 2024-05-08 DIAGNOSIS — T782XXA Anaphylactic shock, unspecified, initial encounter: Secondary | ICD-10-CM | POA: Diagnosis not present

## 2024-05-11 DIAGNOSIS — T782XXA Anaphylactic shock, unspecified, initial encounter: Secondary | ICD-10-CM | POA: Diagnosis not present

## 2024-05-11 DIAGNOSIS — Z992 Dependence on renal dialysis: Secondary | ICD-10-CM | POA: Diagnosis not present

## 2024-05-11 DIAGNOSIS — D631 Anemia in chronic kidney disease: Secondary | ICD-10-CM | POA: Diagnosis not present

## 2024-05-11 DIAGNOSIS — N2581 Secondary hyperparathyroidism of renal origin: Secondary | ICD-10-CM | POA: Diagnosis not present

## 2024-05-11 DIAGNOSIS — N186 End stage renal disease: Secondary | ICD-10-CM | POA: Diagnosis not present

## 2024-05-11 DIAGNOSIS — R519 Headache, unspecified: Secondary | ICD-10-CM | POA: Diagnosis not present

## 2024-05-12 ENCOUNTER — Encounter (HOSPITAL_COMMUNITY): Payer: Self-pay

## 2024-05-12 ENCOUNTER — Emergency Department (HOSPITAL_COMMUNITY)
Admission: EM | Admit: 2024-05-12 | Discharge: 2024-05-12 | Disposition: A | Discharge status: Home / Self Care | Attending: Emergency Medicine | Admitting: Emergency Medicine

## 2024-05-12 DIAGNOSIS — E1122 Type 2 diabetes mellitus with diabetic chronic kidney disease: Secondary | ICD-10-CM | POA: Diagnosis not present

## 2024-05-12 DIAGNOSIS — L02422 Furuncle of left axilla: Secondary | ICD-10-CM | POA: Diagnosis not present

## 2024-05-12 DIAGNOSIS — I132 Hypertensive heart and chronic kidney disease with heart failure and with stage 5 chronic kidney disease, or end stage renal disease: Secondary | ICD-10-CM | POA: Insufficient documentation

## 2024-05-12 DIAGNOSIS — L0291 Cutaneous abscess, unspecified: Secondary | ICD-10-CM

## 2024-05-12 DIAGNOSIS — Z8546 Personal history of malignant neoplasm of prostate: Secondary | ICD-10-CM | POA: Diagnosis not present

## 2024-05-12 DIAGNOSIS — Z992 Dependence on renal dialysis: Secondary | ICD-10-CM | POA: Insufficient documentation

## 2024-05-12 DIAGNOSIS — Z96641 Presence of right artificial hip joint: Secondary | ICD-10-CM | POA: Diagnosis not present

## 2024-05-12 DIAGNOSIS — N186 End stage renal disease: Secondary | ICD-10-CM | POA: Diagnosis not present

## 2024-05-12 DIAGNOSIS — L02411 Cutaneous abscess of right axilla: Secondary | ICD-10-CM | POA: Diagnosis not present

## 2024-05-12 DIAGNOSIS — I509 Heart failure, unspecified: Secondary | ICD-10-CM | POA: Insufficient documentation

## 2024-05-12 DIAGNOSIS — L02421 Furuncle of right axilla: Secondary | ICD-10-CM | POA: Diagnosis not present

## 2024-05-12 DIAGNOSIS — L02412 Cutaneous abscess of left axilla: Secondary | ICD-10-CM | POA: Insufficient documentation

## 2024-05-12 DIAGNOSIS — Z96651 Presence of right artificial knee joint: Secondary | ICD-10-CM | POA: Insufficient documentation

## 2024-05-12 DIAGNOSIS — R251 Tremor, unspecified: Secondary | ICD-10-CM | POA: Diagnosis not present

## 2024-05-12 DIAGNOSIS — F03918 Unspecified dementia, unspecified severity, with other behavioral disturbance: Secondary | ICD-10-CM | POA: Diagnosis not present

## 2024-05-12 MED ORDER — DOXYCYCLINE HYCLATE 100 MG PO CAPS
100.0000 mg | ORAL_CAPSULE | Freq: Two times a day (BID) | ORAL | 0 refills | Status: AC
Start: 2024-05-12 — End: 2024-05-26

## 2024-05-12 MED ORDER — DOXYCYCLINE HYCLATE 100 MG PO TABS
100.0000 mg | ORAL_TABLET | Freq: Once | ORAL | Status: AC
  Administered 2024-05-12: 100 mg via ORAL
  Filled 2024-05-12: qty 1

## 2024-05-12 NOTE — ED Provider Notes (Signed)
 [facility] Emergency Department Provider Note MRN:  [id number]  Arrival date & time: 05/12/24     Chief Complaint   Abscess   History of Present Illness   [name] is a 63 y.o. year-old male with a history of A-fib, CHF, ESRD presenting to the ED with chief complaint of abscess.  Abscess to both axilla, the right 1 is draining but the left 1 not draining despite warm compresses, sent here for evaluation.  No fever, no other complaints.  Review of Systems  A thorough review of systems was obtained and all systems are negative except as noted in the HPI and PMH.   Patient's Health History    Past Medical History:  Diagnosis Date   Anxiety    Arthritis    Atrial fibrillation (HCC)    CHF (congestive heart failure), NYHA class III (HCC)    s/p heart transplant   Chronic systolic dysfunction of left ventricle    CVA (cerebral infarction) 2010   Dementia (HCC)    Depression    Diabetes mellitus    PMH; Prior to heart transplant   ESRD on hemodialysis (HCC)    Family history of coronary artery disease    in both parents   GERD (gastroesophageal reflux disease)    Hyperlipidemia    Hypertension    Left bundle branch block    Morbid obesity (HCC)    status post lap band   Myocardial infarction [facility])    prior to heart transplant   Nonischemic cardiomyopathy (HCC)    prior to heart transplant   Obesity (BMI 30-39.9)    Obstructive sleep apnea    no longer needs CPAP after heart transplant per pt   Premature ventricular contractions    Prostate cancer (HCC)    SOB (shortness of breath)     Past Surgical History:  Procedure Laterality Date   A/V FISTULAGRAM Right 05/23/2023   Procedure: A/V Fistulagram;  Surgeon: Lanis Fonda BRAVO, MD;  Location: [facility] INVASIVE CV LAB;  Service: Cardiovascular;  Laterality: Right;   A/V FISTULAGRAM Right 01/27/2024   Procedure: A/V Fistulagram;  Surgeon: Pearline Norman RAMAN, MD;  Location: [facility] INVASIVE CV LAB;   Service: Vascular;  Laterality: Right;   AV FISTULA PLACEMENT Right 07/04/2023   Procedure: ARTERIOVENOUS (AV) FISTULA  REVISION WITH APPLICATION OF ARTEGRAFT RIGHT ARM;  Surgeon: Sheree Penne Bruckner, MD;  Location: [facility] OR;  Service: Vascular;  Laterality: Right;   AV FISTULA PLACEMENT Right 11/19/2023   Procedure: RIGHT BRACHIOCEPHALIC ARTERIOVENOUS (AV) FISTULA CREATION;  Surgeon: Sheree Penne Bruckner, MD;  Location: [facility] OR;  Service: Vascular;  Laterality: Right;   CARDIAC PACEMAKER PLACEMENT  09/21/2009   Biventricular implantable cardioverter-defibrillator implantation      COLONOSCOPY W/ BIOPSIES AND POLYPECTOMY     CYSTOSCOPY WITH FULGERATION N/A 04/27/2021   Procedure: CYSTOSCOPY AND CLOT EVACUATION WITH BLADDER BIOPSY/ FUGARATION OF BLADDER/ FULGARATION OF PROSTATE ;  Surgeon: Nieves Cough, MD;  [facility]S;  Service: Urology;  Laterality: N/A;   FOOT SURGERY     left   HEART TRANSPLANT  2014   INSERTION OF DIALYSIS CATHETER Right 07/04/2023   Procedure: INSERTION OF DIALYSIS CATHETER WITH 19 CM PALINDROME;  Surgeon: Sheree Penne Bruckner, MD;  Location: [facility] OR;  Service: Vascular;  Laterality: Right;   LAPAROSCOPIC GASTRIC BANDING  01/27/2007   LEFT VENTRICULAR ASSIST DEVICE     implanted at [facility]   PACEMAKER REMOVAL     PROSTATE BIOPSY  TOTAL HIP ARTHROPLASTY Right 07/22/2017   Procedure: RIGHT TOTAL HIP ARTHROPLASTY ANTERIOR APPROACH;  Surgeon: Fidel Rogue, MD;  Location: [facility] OR;  Service: Orthopedics;  Laterality: Right;  Needs RNFA   TOTAL KNEE ARTHROPLASTY Right 07/22/2017    Family History  Problem Relation Age of Onset   Coronary artery disease Father        had, PTCA & CABG   Heart attack Father    Hypertension Father    Diabetes Father    Prostate cancer Father    Coronary artery disease Mother    Heart attack Mother    Hypertension Mother    Stroke Mother    Lupus Mother    Prostate cancer Brother    Coronary artery disease Brother     Coronary artery disease Sister    Heart attack Sister    Prostate cancer Paternal Uncle    Coronary artery disease Maternal Grandmother    Coronary artery disease Maternal Grandfather    Coronary artery disease Paternal Grandmother    Coronary artery disease Paternal Grandfather     Social History   Socioeconomic History   Marital status: Married    Spouse name: Proofreader   Number of children: 1   Years of education: 16   Highest education level: Not on file  Occupational History   Occupation: retired    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  Has one child.     Retired from the [employer].  Supervisor over transportation.    Education: college.    Social Drivers of Corporate investment banker Strain: Not on file  Food Insecurity: No Food Insecurity (11/25/2023)   Hunger Vital Sign    Worried About Running Out of Food in the Last Year: Never true    Ran Out of Food in the Last Year: Never true  Transportation Needs: No Transportation Needs (11/25/2023)   PRAPARE - Administrator, Civil Service (Medical): No    Lack of Transportation (Non-Medical): No  Physical Activity: Not on file  Stress: Not on file  Social Connections: Not on file  Intimate Partner Violence: Not At Risk (11/25/2023)   Humiliation, Afraid, Rape, and Kick questionnaire    Fear of Current or Ex-Partner: No    Emotionally Abused: No    Physically Abused: No    Sexually Abused: No     Physical Exam   Vitals:   05/12/24 1956 05/12/24 2248  BP: (!) 114/93 104/66  Pulse: 90 86  Resp: 18 (!) 24  Temp: 98.6 F (37 C) 98.7 F (37.1 C)  SpO2: 99% 99%    CONSTITUTIONAL:  Chronically ill-appearing, NAD NEURO/PSYCH:  Alert and oriented x 3, no focal deficits EYES:  eyes equal and reactive ENT/NECK:  no LAD, no JVD CARDIO: Regular rate, well-perfused, normal S1 and S2 PULM:  CTAB no wheezing or rhonchi GI/GU:  non-distended, non-tender MSK/SPINE:  No gross deformities, no edema SKIN: Draining abscess to bilateral axilla   *Additional and/or pertinent findings included in MDM below  Diagnostic and Interventional Summary    EKG Interpretation Date/Time:  Ventricular Rate:    PR Interval:    QRS Duration:    QT Interval:    QTC Calculation:   R Axis:      Text Interpretation:         Labs Reviewed - No data to display  No orders to display    Medications  doxycycline  (VIBRA -TABS) tablet 100 mg (has no administration in time range)     Procedures  /  Critical Care Procedures  ED Course and Medical Decision Making  Initial Impression and Ddx The left axilla in question does indeed have an abscess that I was able to express purulent material out of with light touch.  Draining freely, no real need for I&D.  He has 2 draining areas to the left axilla, L1 area on the right that seems to be healing fairly well.  Past medical/surgical history that increases complexity of ED encounter: Diabetes among many other comorbidities  Interpretation of Diagnostics Laboratory and/or imaging options to aid in the diagnosis/care of the patient were considered.  After careful history and physical examination, it was determined that there was no indication for diagnostics at this time.  Patient Reassessment and Ultimate Disposition/Management     No real indication for I&D, appropriate for discharge with continued warm compresses, antibiotics.  Patient management required discussion with the following services or consulting groups:  None  Complexity of Problems Addressed Acute complicated illness or Injury  Additional Data Reviewed and  Analyzed Further history obtained from: Further history from spouse/family member  Additional Factors Impacting ED Encounter Risk Prescriptions  Ozell HERO. Theadore, MD [facility] Emergency Medicine [facility] [email or web address]  Final Clinical Impressions(s) / ED Diagnoses     ICD-10-CM   1. Abscess  L02.91       ED Discharge Orders          Ordered    doxycycline  (VIBRAMYCIN ) 100 MG capsule  2 times daily        05/12/24 2331             Discharge Instructions Discussed with and Provided to Patient:    Discharge Instructions      You were evaluated in the Emergency Department and after careful evaluation, we did not find any emergent condition requiring admission or further testing in the hospital.  Your exam/testing today is overall reassuring.  Symptoms seem to be due to multiple areas of abscess.  Everything seems to be draining already feeling better at this time.  We expressed some of the purulence here in the emergency department.  Recommend use of the doxycycline  antibiotic twice daily and follow-up with your regular doctors.  Please return to the Emergency Department if you experience any worsening of your condition.   Thank you for allowing us  to be a part of your care.      Theadore Ozell HERO, MD 05/12/24 [phone]

## 2024-05-12 NOTE — ED Triage Notes (Signed)
 Pt BIB family, states that he has an abscess under his L arm that began draining and now has one under his R arm, no fevers, pt is on palliative care

## 2024-05-12 NOTE — Discharge Instructions (Addendum)
 You were evaluated in the Emergency Department and after careful evaluation, we did not find any emergent condition requiring admission or further testing in the hospital.  Your exam/testing today is overall reassuring.  Symptoms seem to be due to multiple areas of abscess.  Everything seems to be draining already feeling better at this time.  We expressed some of the purulence here in the emergency department.  Recommend use of the doxycycline  antibiotic twice daily and follow-up with your regular doctors.  Please return to the Emergency Department if you experience any worsening of your condition.   Thank you for allowing us  to be a part of your care.

## 2024-05-13 DIAGNOSIS — T782XXA Anaphylactic shock, unspecified, initial encounter: Secondary | ICD-10-CM | POA: Diagnosis not present

## 2024-05-13 DIAGNOSIS — R519 Headache, unspecified: Secondary | ICD-10-CM | POA: Diagnosis not present

## 2024-05-13 DIAGNOSIS — N2581 Secondary hyperparathyroidism of renal origin: Secondary | ICD-10-CM | POA: Diagnosis not present

## 2024-05-13 DIAGNOSIS — N186 End stage renal disease: Secondary | ICD-10-CM | POA: Diagnosis not present

## 2024-05-13 DIAGNOSIS — D631 Anemia in chronic kidney disease: Secondary | ICD-10-CM | POA: Diagnosis not present

## 2024-05-13 DIAGNOSIS — Z992 Dependence on renal dialysis: Secondary | ICD-10-CM | POA: Diagnosis not present

## 2024-05-15 DIAGNOSIS — T782XXA Anaphylactic shock, unspecified, initial encounter: Secondary | ICD-10-CM | POA: Diagnosis not present

## 2024-05-15 DIAGNOSIS — N186 End stage renal disease: Secondary | ICD-10-CM | POA: Diagnosis not present

## 2024-05-15 DIAGNOSIS — N2581 Secondary hyperparathyroidism of renal origin: Secondary | ICD-10-CM | POA: Diagnosis not present

## 2024-05-15 DIAGNOSIS — D631 Anemia in chronic kidney disease: Secondary | ICD-10-CM | POA: Diagnosis not present

## 2024-05-15 DIAGNOSIS — Z992 Dependence on renal dialysis: Secondary | ICD-10-CM | POA: Diagnosis not present

## 2024-05-15 DIAGNOSIS — R519 Headache, unspecified: Secondary | ICD-10-CM | POA: Diagnosis not present

## 2024-05-18 DIAGNOSIS — N186 End stage renal disease: Secondary | ICD-10-CM | POA: Diagnosis not present

## 2024-05-18 DIAGNOSIS — R519 Headache, unspecified: Secondary | ICD-10-CM | POA: Diagnosis not present

## 2024-05-18 DIAGNOSIS — N2581 Secondary hyperparathyroidism of renal origin: Secondary | ICD-10-CM | POA: Diagnosis not present

## 2024-05-18 DIAGNOSIS — E1140 Type 2 diabetes mellitus with diabetic neuropathy, unspecified: Secondary | ICD-10-CM | POA: Diagnosis not present

## 2024-05-18 DIAGNOSIS — T782XXA Anaphylactic shock, unspecified, initial encounter: Secondary | ICD-10-CM | POA: Diagnosis not present

## 2024-05-18 DIAGNOSIS — D631 Anemia in chronic kidney disease: Secondary | ICD-10-CM | POA: Diagnosis not present

## 2024-05-18 DIAGNOSIS — G894 Chronic pain syndrome: Secondary | ICD-10-CM | POA: Diagnosis not present

## 2024-05-18 DIAGNOSIS — Z992 Dependence on renal dialysis: Secondary | ICD-10-CM | POA: Diagnosis not present

## 2024-05-18 DIAGNOSIS — F03918 Unspecified dementia, unspecified severity, with other behavioral disturbance: Secondary | ICD-10-CM | POA: Diagnosis not present

## 2024-05-20 DIAGNOSIS — Z992 Dependence on renal dialysis: Secondary | ICD-10-CM | POA: Diagnosis not present

## 2024-05-20 DIAGNOSIS — N186 End stage renal disease: Secondary | ICD-10-CM | POA: Diagnosis not present

## 2024-05-20 DIAGNOSIS — T782XXA Anaphylactic shock, unspecified, initial encounter: Secondary | ICD-10-CM | POA: Diagnosis not present

## 2024-05-20 DIAGNOSIS — D631 Anemia in chronic kidney disease: Secondary | ICD-10-CM | POA: Diagnosis not present

## 2024-05-20 DIAGNOSIS — N2581 Secondary hyperparathyroidism of renal origin: Secondary | ICD-10-CM | POA: Diagnosis not present

## 2024-05-20 DIAGNOSIS — R519 Headache, unspecified: Secondary | ICD-10-CM | POA: Diagnosis not present

## 2024-05-22 DIAGNOSIS — N186 End stage renal disease: Secondary | ICD-10-CM | POA: Diagnosis not present

## 2024-05-22 DIAGNOSIS — N2581 Secondary hyperparathyroidism of renal origin: Secondary | ICD-10-CM | POA: Diagnosis not present

## 2024-05-22 DIAGNOSIS — D631 Anemia in chronic kidney disease: Secondary | ICD-10-CM | POA: Diagnosis not present

## 2024-05-22 DIAGNOSIS — E1122 Type 2 diabetes mellitus with diabetic chronic kidney disease: Secondary | ICD-10-CM | POA: Diagnosis not present

## 2024-05-22 DIAGNOSIS — T782XXA Anaphylactic shock, unspecified, initial encounter: Secondary | ICD-10-CM | POA: Diagnosis not present

## 2024-05-22 DIAGNOSIS — Z992 Dependence on renal dialysis: Secondary | ICD-10-CM | POA: Diagnosis not present

## 2024-05-22 DIAGNOSIS — R519 Headache, unspecified: Secondary | ICD-10-CM | POA: Diagnosis not present

## 2024-05-22 DIAGNOSIS — I420 Dilated cardiomyopathy: Secondary | ICD-10-CM | POA: Diagnosis not present

## 2024-05-22 DIAGNOSIS — I509 Heart failure, unspecified: Secondary | ICD-10-CM | POA: Diagnosis not present

## 2024-05-22 DIAGNOSIS — I132 Hypertensive heart and chronic kidney disease with heart failure and with stage 5 chronic kidney disease, or end stage renal disease: Secondary | ICD-10-CM | POA: Diagnosis not present

## 2024-05-24 DIAGNOSIS — R251 Tremor, unspecified: Secondary | ICD-10-CM | POA: Diagnosis not present

## 2024-05-24 DIAGNOSIS — I10 Essential (primary) hypertension: Secondary | ICD-10-CM | POA: Diagnosis not present

## 2024-05-25 ENCOUNTER — Other Ambulatory Visit: Payer: Self-pay

## 2024-05-25 DIAGNOSIS — E1151 Type 2 diabetes mellitus with diabetic peripheral angiopathy without gangrene: Secondary | ICD-10-CM | POA: Diagnosis not present

## 2024-05-25 DIAGNOSIS — D631 Anemia in chronic kidney disease: Secondary | ICD-10-CM | POA: Diagnosis not present

## 2024-05-25 DIAGNOSIS — I739 Peripheral vascular disease, unspecified: Secondary | ICD-10-CM

## 2024-05-25 DIAGNOSIS — R519 Headache, unspecified: Secondary | ICD-10-CM | POA: Diagnosis not present

## 2024-05-25 DIAGNOSIS — N186 End stage renal disease: Secondary | ICD-10-CM | POA: Diagnosis not present

## 2024-05-25 DIAGNOSIS — I951 Orthostatic hypotension: Secondary | ICD-10-CM | POA: Diagnosis not present

## 2024-05-25 DIAGNOSIS — I4891 Unspecified atrial fibrillation: Secondary | ICD-10-CM | POA: Diagnosis not present

## 2024-05-25 DIAGNOSIS — F0283 Dementia in other diseases classified elsewhere, unspecified severity, with mood disturbance: Secondary | ICD-10-CM | POA: Diagnosis not present

## 2024-05-25 DIAGNOSIS — G20C Parkinsonism, unspecified: Secondary | ICD-10-CM | POA: Diagnosis not present

## 2024-05-25 DIAGNOSIS — F02818 Dementia in other diseases classified elsewhere, unspecified severity, with other behavioral disturbance: Secondary | ICD-10-CM | POA: Diagnosis not present

## 2024-05-25 DIAGNOSIS — E1142 Type 2 diabetes mellitus with diabetic polyneuropathy: Secondary | ICD-10-CM | POA: Diagnosis not present

## 2024-05-25 DIAGNOSIS — I2510 Atherosclerotic heart disease of native coronary artery without angina pectoris: Secondary | ICD-10-CM | POA: Diagnosis not present

## 2024-05-25 DIAGNOSIS — M159 Polyosteoarthritis, unspecified: Secondary | ICD-10-CM | POA: Diagnosis not present

## 2024-05-25 DIAGNOSIS — Z7901 Long term (current) use of anticoagulants: Secondary | ICD-10-CM | POA: Diagnosis not present

## 2024-05-25 DIAGNOSIS — Z992 Dependence on renal dialysis: Secondary | ICD-10-CM | POA: Diagnosis not present

## 2024-05-25 DIAGNOSIS — G894 Chronic pain syndrome: Secondary | ICD-10-CM | POA: Diagnosis not present

## 2024-05-25 DIAGNOSIS — E7800 Pure hypercholesterolemia, unspecified: Secondary | ICD-10-CM | POA: Diagnosis not present

## 2024-05-25 DIAGNOSIS — Z6833 Body mass index (BMI) 33.0-33.9, adult: Secondary | ICD-10-CM | POA: Diagnosis not present

## 2024-05-25 DIAGNOSIS — T782XXA Anaphylactic shock, unspecified, initial encounter: Secondary | ICD-10-CM | POA: Diagnosis not present

## 2024-05-25 DIAGNOSIS — D6859 Other primary thrombophilia: Secondary | ICD-10-CM | POA: Diagnosis not present

## 2024-05-25 DIAGNOSIS — I509 Heart failure, unspecified: Secondary | ICD-10-CM | POA: Diagnosis not present

## 2024-05-25 DIAGNOSIS — N2581 Secondary hyperparathyroidism of renal origin: Secondary | ICD-10-CM | POA: Diagnosis not present

## 2024-05-25 DIAGNOSIS — E1122 Type 2 diabetes mellitus with diabetic chronic kidney disease: Secondary | ICD-10-CM | POA: Diagnosis not present

## 2024-05-25 DIAGNOSIS — I132 Hypertensive heart and chronic kidney disease with heart failure and with stage 5 chronic kidney disease, or end stage renal disease: Secondary | ICD-10-CM | POA: Diagnosis not present

## 2024-05-25 DIAGNOSIS — I420 Dilated cardiomyopathy: Secondary | ICD-10-CM | POA: Diagnosis not present

## 2024-05-25 DIAGNOSIS — F331 Major depressive disorder, recurrent, moderate: Secondary | ICD-10-CM | POA: Diagnosis not present

## 2024-05-25 DIAGNOSIS — G4733 Obstructive sleep apnea (adult) (pediatric): Secondary | ICD-10-CM | POA: Diagnosis not present

## 2024-05-27 DIAGNOSIS — R519 Headache, unspecified: Secondary | ICD-10-CM | POA: Diagnosis not present

## 2024-05-27 DIAGNOSIS — D509 Iron deficiency anemia, unspecified: Secondary | ICD-10-CM | POA: Diagnosis not present

## 2024-05-27 DIAGNOSIS — D631 Anemia in chronic kidney disease: Secondary | ICD-10-CM | POA: Diagnosis not present

## 2024-05-27 DIAGNOSIS — T8620 Unspecified complication of heart transplant: Secondary | ICD-10-CM | POA: Diagnosis not present

## 2024-05-27 DIAGNOSIS — E1129 Type 2 diabetes mellitus with other diabetic kidney complication: Secondary | ICD-10-CM | POA: Diagnosis not present

## 2024-05-27 DIAGNOSIS — N186 End stage renal disease: Secondary | ICD-10-CM | POA: Diagnosis not present

## 2024-05-27 DIAGNOSIS — Z23 Encounter for immunization: Secondary | ICD-10-CM | POA: Diagnosis not present

## 2024-05-27 DIAGNOSIS — F02C0 Dementia in other diseases classified elsewhere, severe, without behavioral disturbance, psychotic disturbance, mood disturbance, and anxiety: Secondary | ICD-10-CM | POA: Diagnosis not present

## 2024-05-27 DIAGNOSIS — N2581 Secondary hyperparathyroidism of renal origin: Secondary | ICD-10-CM | POA: Diagnosis not present

## 2024-05-27 DIAGNOSIS — Z992 Dependence on renal dialysis: Secondary | ICD-10-CM | POA: Diagnosis not present

## 2024-05-28 DIAGNOSIS — I509 Heart failure, unspecified: Secondary | ICD-10-CM | POA: Diagnosis not present

## 2024-05-28 DIAGNOSIS — I420 Dilated cardiomyopathy: Secondary | ICD-10-CM | POA: Diagnosis not present

## 2024-05-28 DIAGNOSIS — D631 Anemia in chronic kidney disease: Secondary | ICD-10-CM | POA: Diagnosis not present

## 2024-05-28 DIAGNOSIS — N186 End stage renal disease: Secondary | ICD-10-CM | POA: Diagnosis not present

## 2024-05-28 DIAGNOSIS — E1122 Type 2 diabetes mellitus with diabetic chronic kidney disease: Secondary | ICD-10-CM | POA: Diagnosis not present

## 2024-05-28 DIAGNOSIS — I132 Hypertensive heart and chronic kidney disease with heart failure and with stage 5 chronic kidney disease, or end stage renal disease: Secondary | ICD-10-CM | POA: Diagnosis not present

## 2024-05-29 DIAGNOSIS — N186 End stage renal disease: Secondary | ICD-10-CM | POA: Diagnosis not present

## 2024-05-29 DIAGNOSIS — D509 Iron deficiency anemia, unspecified: Secondary | ICD-10-CM | POA: Diagnosis not present

## 2024-05-29 DIAGNOSIS — N2581 Secondary hyperparathyroidism of renal origin: Secondary | ICD-10-CM | POA: Diagnosis not present

## 2024-05-29 DIAGNOSIS — R519 Headache, unspecified: Secondary | ICD-10-CM | POA: Diagnosis not present

## 2024-05-29 DIAGNOSIS — D631 Anemia in chronic kidney disease: Secondary | ICD-10-CM | POA: Diagnosis not present

## 2024-05-29 DIAGNOSIS — Z992 Dependence on renal dialysis: Secondary | ICD-10-CM | POA: Diagnosis not present

## 2024-06-01 DIAGNOSIS — R519 Headache, unspecified: Secondary | ICD-10-CM | POA: Diagnosis not present

## 2024-06-01 DIAGNOSIS — D509 Iron deficiency anemia, unspecified: Secondary | ICD-10-CM | POA: Diagnosis not present

## 2024-06-01 DIAGNOSIS — N2581 Secondary hyperparathyroidism of renal origin: Secondary | ICD-10-CM | POA: Diagnosis not present

## 2024-06-01 DIAGNOSIS — D631 Anemia in chronic kidney disease: Secondary | ICD-10-CM | POA: Diagnosis not present

## 2024-06-01 DIAGNOSIS — Z992 Dependence on renal dialysis: Secondary | ICD-10-CM | POA: Diagnosis not present

## 2024-06-01 DIAGNOSIS — N186 End stage renal disease: Secondary | ICD-10-CM | POA: Diagnosis not present

## 2024-06-03 DIAGNOSIS — N186 End stage renal disease: Secondary | ICD-10-CM | POA: Diagnosis not present

## 2024-06-03 DIAGNOSIS — D509 Iron deficiency anemia, unspecified: Secondary | ICD-10-CM | POA: Diagnosis not present

## 2024-06-03 DIAGNOSIS — Z992 Dependence on renal dialysis: Secondary | ICD-10-CM | POA: Diagnosis not present

## 2024-06-03 DIAGNOSIS — D631 Anemia in chronic kidney disease: Secondary | ICD-10-CM | POA: Diagnosis not present

## 2024-06-03 DIAGNOSIS — R519 Headache, unspecified: Secondary | ICD-10-CM | POA: Diagnosis not present

## 2024-06-03 DIAGNOSIS — N2581 Secondary hyperparathyroidism of renal origin: Secondary | ICD-10-CM | POA: Diagnosis not present

## 2024-06-04 DIAGNOSIS — I132 Hypertensive heart and chronic kidney disease with heart failure and with stage 5 chronic kidney disease, or end stage renal disease: Secondary | ICD-10-CM | POA: Diagnosis not present

## 2024-06-04 DIAGNOSIS — I420 Dilated cardiomyopathy: Secondary | ICD-10-CM | POA: Diagnosis not present

## 2024-06-04 DIAGNOSIS — I509 Heart failure, unspecified: Secondary | ICD-10-CM | POA: Diagnosis not present

## 2024-06-04 DIAGNOSIS — N186 End stage renal disease: Secondary | ICD-10-CM | POA: Diagnosis not present

## 2024-06-04 DIAGNOSIS — E1122 Type 2 diabetes mellitus with diabetic chronic kidney disease: Secondary | ICD-10-CM | POA: Diagnosis not present

## 2024-06-04 DIAGNOSIS — D631 Anemia in chronic kidney disease: Secondary | ICD-10-CM | POA: Diagnosis not present

## 2024-06-05 DIAGNOSIS — N186 End stage renal disease: Secondary | ICD-10-CM | POA: Diagnosis not present

## 2024-06-05 DIAGNOSIS — N2581 Secondary hyperparathyroidism of renal origin: Secondary | ICD-10-CM | POA: Diagnosis not present

## 2024-06-05 DIAGNOSIS — D509 Iron deficiency anemia, unspecified: Secondary | ICD-10-CM | POA: Diagnosis not present

## 2024-06-05 DIAGNOSIS — R519 Headache, unspecified: Secondary | ICD-10-CM | POA: Diagnosis not present

## 2024-06-05 DIAGNOSIS — Z992 Dependence on renal dialysis: Secondary | ICD-10-CM | POA: Diagnosis not present

## 2024-06-05 DIAGNOSIS — D631 Anemia in chronic kidney disease: Secondary | ICD-10-CM | POA: Diagnosis not present

## 2024-06-08 DIAGNOSIS — N186 End stage renal disease: Secondary | ICD-10-CM | POA: Diagnosis not present

## 2024-06-08 DIAGNOSIS — R519 Headache, unspecified: Secondary | ICD-10-CM | POA: Diagnosis not present

## 2024-06-08 DIAGNOSIS — N2581 Secondary hyperparathyroidism of renal origin: Secondary | ICD-10-CM | POA: Diagnosis not present

## 2024-06-08 DIAGNOSIS — D631 Anemia in chronic kidney disease: Secondary | ICD-10-CM | POA: Diagnosis not present

## 2024-06-08 DIAGNOSIS — D509 Iron deficiency anemia, unspecified: Secondary | ICD-10-CM | POA: Diagnosis not present

## 2024-06-08 DIAGNOSIS — Z992 Dependence on renal dialysis: Secondary | ICD-10-CM | POA: Diagnosis not present

## 2024-06-09 DIAGNOSIS — I420 Dilated cardiomyopathy: Secondary | ICD-10-CM | POA: Diagnosis not present

## 2024-06-09 DIAGNOSIS — I132 Hypertensive heart and chronic kidney disease with heart failure and with stage 5 chronic kidney disease, or end stage renal disease: Secondary | ICD-10-CM | POA: Diagnosis not present

## 2024-06-09 DIAGNOSIS — E1122 Type 2 diabetes mellitus with diabetic chronic kidney disease: Secondary | ICD-10-CM | POA: Diagnosis not present

## 2024-06-09 DIAGNOSIS — N186 End stage renal disease: Secondary | ICD-10-CM | POA: Diagnosis not present

## 2024-06-09 DIAGNOSIS — I509 Heart failure, unspecified: Secondary | ICD-10-CM | POA: Diagnosis not present

## 2024-06-09 DIAGNOSIS — D631 Anemia in chronic kidney disease: Secondary | ICD-10-CM | POA: Diagnosis not present

## 2024-06-10 DIAGNOSIS — Z992 Dependence on renal dialysis: Secondary | ICD-10-CM | POA: Diagnosis not present

## 2024-06-10 DIAGNOSIS — N186 End stage renal disease: Secondary | ICD-10-CM | POA: Diagnosis not present

## 2024-06-10 DIAGNOSIS — R519 Headache, unspecified: Secondary | ICD-10-CM | POA: Diagnosis not present

## 2024-06-10 DIAGNOSIS — N2581 Secondary hyperparathyroidism of renal origin: Secondary | ICD-10-CM | POA: Diagnosis not present

## 2024-06-10 DIAGNOSIS — D631 Anemia in chronic kidney disease: Secondary | ICD-10-CM | POA: Diagnosis not present

## 2024-06-10 DIAGNOSIS — D509 Iron deficiency anemia, unspecified: Secondary | ICD-10-CM | POA: Diagnosis not present

## 2024-06-12 DIAGNOSIS — D509 Iron deficiency anemia, unspecified: Secondary | ICD-10-CM | POA: Diagnosis not present

## 2024-06-12 DIAGNOSIS — N2581 Secondary hyperparathyroidism of renal origin: Secondary | ICD-10-CM | POA: Diagnosis not present

## 2024-06-12 DIAGNOSIS — R519 Headache, unspecified: Secondary | ICD-10-CM | POA: Diagnosis not present

## 2024-06-12 DIAGNOSIS — D631 Anemia in chronic kidney disease: Secondary | ICD-10-CM | POA: Diagnosis not present

## 2024-06-12 DIAGNOSIS — N186 End stage renal disease: Secondary | ICD-10-CM | POA: Diagnosis not present

## 2024-06-12 DIAGNOSIS — Z992 Dependence on renal dialysis: Secondary | ICD-10-CM | POA: Diagnosis not present

## 2024-06-15 DIAGNOSIS — N186 End stage renal disease: Secondary | ICD-10-CM | POA: Diagnosis not present

## 2024-06-15 DIAGNOSIS — Z992 Dependence on renal dialysis: Secondary | ICD-10-CM | POA: Diagnosis not present

## 2024-06-15 DIAGNOSIS — N2581 Secondary hyperparathyroidism of renal origin: Secondary | ICD-10-CM | POA: Diagnosis not present

## 2024-06-15 DIAGNOSIS — R519 Headache, unspecified: Secondary | ICD-10-CM | POA: Diagnosis not present

## 2024-06-15 DIAGNOSIS — D631 Anemia in chronic kidney disease: Secondary | ICD-10-CM | POA: Diagnosis not present

## 2024-06-15 DIAGNOSIS — D509 Iron deficiency anemia, unspecified: Secondary | ICD-10-CM | POA: Diagnosis not present

## 2024-06-17 DIAGNOSIS — N2581 Secondary hyperparathyroidism of renal origin: Secondary | ICD-10-CM | POA: Diagnosis not present

## 2024-06-17 DIAGNOSIS — D631 Anemia in chronic kidney disease: Secondary | ICD-10-CM | POA: Diagnosis not present

## 2024-06-17 DIAGNOSIS — N186 End stage renal disease: Secondary | ICD-10-CM | POA: Diagnosis not present

## 2024-06-17 DIAGNOSIS — R519 Headache, unspecified: Secondary | ICD-10-CM | POA: Diagnosis not present

## 2024-06-17 DIAGNOSIS — D509 Iron deficiency anemia, unspecified: Secondary | ICD-10-CM | POA: Diagnosis not present

## 2024-06-17 DIAGNOSIS — Z992 Dependence on renal dialysis: Secondary | ICD-10-CM | POA: Diagnosis not present

## 2024-06-18 DIAGNOSIS — I509 Heart failure, unspecified: Secondary | ICD-10-CM | POA: Diagnosis not present

## 2024-06-18 DIAGNOSIS — I420 Dilated cardiomyopathy: Secondary | ICD-10-CM | POA: Diagnosis not present

## 2024-06-18 DIAGNOSIS — I739 Peripheral vascular disease, unspecified: Secondary | ICD-10-CM | POA: Diagnosis not present

## 2024-06-18 DIAGNOSIS — E559 Vitamin D deficiency, unspecified: Secondary | ICD-10-CM | POA: Diagnosis not present

## 2024-06-18 DIAGNOSIS — Z713 Dietary counseling and surveillance: Secondary | ICD-10-CM | POA: Diagnosis not present

## 2024-06-18 DIAGNOSIS — N186 End stage renal disease: Secondary | ICD-10-CM | POA: Diagnosis not present

## 2024-06-18 DIAGNOSIS — L97521 Non-pressure chronic ulcer of other part of left foot limited to breakdown of skin: Secondary | ICD-10-CM | POA: Diagnosis not present

## 2024-06-18 DIAGNOSIS — Z941 Heart transplant status: Secondary | ICD-10-CM | POA: Diagnosis not present

## 2024-06-18 DIAGNOSIS — R296 Repeated falls: Secondary | ICD-10-CM | POA: Diagnosis not present

## 2024-06-18 DIAGNOSIS — D631 Anemia in chronic kidney disease: Secondary | ICD-10-CM | POA: Diagnosis not present

## 2024-06-18 DIAGNOSIS — E1122 Type 2 diabetes mellitus with diabetic chronic kidney disease: Secondary | ICD-10-CM | POA: Diagnosis not present

## 2024-06-18 DIAGNOSIS — M6281 Muscle weakness (generalized): Secondary | ICD-10-CM | POA: Diagnosis not present

## 2024-06-18 DIAGNOSIS — I132 Hypertensive heart and chronic kidney disease with heart failure and with stage 5 chronic kidney disease, or end stage renal disease: Secondary | ICD-10-CM | POA: Diagnosis not present

## 2024-06-18 DIAGNOSIS — F03918 Unspecified dementia, unspecified severity, with other behavioral disturbance: Secondary | ICD-10-CM | POA: Diagnosis not present

## 2024-06-18 DIAGNOSIS — R600 Localized edema: Secondary | ICD-10-CM | POA: Diagnosis not present

## 2024-06-18 DIAGNOSIS — L89152 Pressure ulcer of sacral region, stage 2: Secondary | ICD-10-CM | POA: Diagnosis not present

## 2024-06-19 DIAGNOSIS — R519 Headache, unspecified: Secondary | ICD-10-CM | POA: Diagnosis not present

## 2024-06-19 DIAGNOSIS — N186 End stage renal disease: Secondary | ICD-10-CM | POA: Diagnosis not present

## 2024-06-19 DIAGNOSIS — N2581 Secondary hyperparathyroidism of renal origin: Secondary | ICD-10-CM | POA: Diagnosis not present

## 2024-06-19 DIAGNOSIS — D631 Anemia in chronic kidney disease: Secondary | ICD-10-CM | POA: Diagnosis not present

## 2024-06-19 DIAGNOSIS — Z992 Dependence on renal dialysis: Secondary | ICD-10-CM | POA: Diagnosis not present

## 2024-06-19 DIAGNOSIS — D509 Iron deficiency anemia, unspecified: Secondary | ICD-10-CM | POA: Diagnosis not present

## 2024-06-22 DIAGNOSIS — D509 Iron deficiency anemia, unspecified: Secondary | ICD-10-CM | POA: Diagnosis not present

## 2024-06-22 DIAGNOSIS — N186 End stage renal disease: Secondary | ICD-10-CM | POA: Diagnosis not present

## 2024-06-22 DIAGNOSIS — R519 Headache, unspecified: Secondary | ICD-10-CM | POA: Diagnosis not present

## 2024-06-22 DIAGNOSIS — D631 Anemia in chronic kidney disease: Secondary | ICD-10-CM | POA: Diagnosis not present

## 2024-06-22 DIAGNOSIS — N2581 Secondary hyperparathyroidism of renal origin: Secondary | ICD-10-CM | POA: Diagnosis not present

## 2024-06-22 DIAGNOSIS — Z992 Dependence on renal dialysis: Secondary | ICD-10-CM | POA: Diagnosis not present

## 2024-06-23 DIAGNOSIS — N186 End stage renal disease: Secondary | ICD-10-CM | POA: Diagnosis not present

## 2024-06-23 DIAGNOSIS — E1122 Type 2 diabetes mellitus with diabetic chronic kidney disease: Secondary | ICD-10-CM | POA: Diagnosis not present

## 2024-06-23 DIAGNOSIS — I132 Hypertensive heart and chronic kidney disease with heart failure and with stage 5 chronic kidney disease, or end stage renal disease: Secondary | ICD-10-CM | POA: Diagnosis not present

## 2024-06-23 DIAGNOSIS — I509 Heart failure, unspecified: Secondary | ICD-10-CM | POA: Diagnosis not present

## 2024-06-23 DIAGNOSIS — D631 Anemia in chronic kidney disease: Secondary | ICD-10-CM | POA: Diagnosis not present

## 2024-06-23 DIAGNOSIS — I420 Dilated cardiomyopathy: Secondary | ICD-10-CM | POA: Diagnosis not present

## 2024-06-24 ENCOUNTER — Ambulatory Visit (HOSPITAL_COMMUNITY)
Admission: RE | Admit: 2024-06-24 | Discharge: 2024-06-24 | Disposition: A | Discharge status: Home / Self Care | Source: Ambulatory Visit | Attending: Vascular Surgery | Admitting: Vascular Surgery

## 2024-06-24 ENCOUNTER — Ambulatory Visit: Admitting: Vascular Surgery

## 2024-06-24 ENCOUNTER — Encounter: Payer: Self-pay | Admitting: Vascular Surgery

## 2024-06-24 VITALS — BP 107/71 | HR 94 | Temp 98.4°F

## 2024-06-24 DIAGNOSIS — Z992 Dependence on renal dialysis: Secondary | ICD-10-CM | POA: Diagnosis not present

## 2024-06-24 DIAGNOSIS — I4891 Unspecified atrial fibrillation: Secondary | ICD-10-CM | POA: Diagnosis not present

## 2024-06-24 DIAGNOSIS — N2581 Secondary hyperparathyroidism of renal origin: Secondary | ICD-10-CM | POA: Diagnosis not present

## 2024-06-24 DIAGNOSIS — R519 Headache, unspecified: Secondary | ICD-10-CM | POA: Diagnosis not present

## 2024-06-24 DIAGNOSIS — G4733 Obstructive sleep apnea (adult) (pediatric): Secondary | ICD-10-CM | POA: Diagnosis not present

## 2024-06-24 DIAGNOSIS — Z7901 Long term (current) use of anticoagulants: Secondary | ICD-10-CM | POA: Diagnosis not present

## 2024-06-24 DIAGNOSIS — I7025 Atherosclerosis of native arteries of other extremities with ulceration: Secondary | ICD-10-CM

## 2024-06-24 DIAGNOSIS — I132 Hypertensive heart and chronic kidney disease with heart failure and with stage 5 chronic kidney disease, or end stage renal disease: Secondary | ICD-10-CM | POA: Diagnosis not present

## 2024-06-24 DIAGNOSIS — N186 End stage renal disease: Secondary | ICD-10-CM | POA: Diagnosis not present

## 2024-06-24 DIAGNOSIS — G894 Chronic pain syndrome: Secondary | ICD-10-CM | POA: Diagnosis not present

## 2024-06-24 DIAGNOSIS — E1151 Type 2 diabetes mellitus with diabetic peripheral angiopathy without gangrene: Secondary | ICD-10-CM | POA: Diagnosis not present

## 2024-06-24 DIAGNOSIS — G20C Parkinsonism, unspecified: Secondary | ICD-10-CM | POA: Diagnosis not present

## 2024-06-24 DIAGNOSIS — I739 Peripheral vascular disease, unspecified: Secondary | ICD-10-CM | POA: Diagnosis not present

## 2024-06-24 DIAGNOSIS — F331 Major depressive disorder, recurrent, moderate: Secondary | ICD-10-CM | POA: Diagnosis not present

## 2024-06-24 DIAGNOSIS — M159 Polyosteoarthritis, unspecified: Secondary | ICD-10-CM | POA: Diagnosis not present

## 2024-06-24 DIAGNOSIS — D6859 Other primary thrombophilia: Secondary | ICD-10-CM | POA: Diagnosis not present

## 2024-06-24 DIAGNOSIS — D631 Anemia in chronic kidney disease: Secondary | ICD-10-CM | POA: Diagnosis not present

## 2024-06-24 DIAGNOSIS — F0283 Dementia in other diseases classified elsewhere, unspecified severity, with mood disturbance: Secondary | ICD-10-CM | POA: Diagnosis not present

## 2024-06-24 DIAGNOSIS — I2510 Atherosclerotic heart disease of native coronary artery without angina pectoris: Secondary | ICD-10-CM | POA: Diagnosis not present

## 2024-06-24 DIAGNOSIS — E1122 Type 2 diabetes mellitus with diabetic chronic kidney disease: Secondary | ICD-10-CM | POA: Diagnosis not present

## 2024-06-24 DIAGNOSIS — E1142 Type 2 diabetes mellitus with diabetic polyneuropathy: Secondary | ICD-10-CM | POA: Diagnosis not present

## 2024-06-24 DIAGNOSIS — F02818 Dementia in other diseases classified elsewhere, unspecified severity, with other behavioral disturbance: Secondary | ICD-10-CM | POA: Diagnosis not present

## 2024-06-24 DIAGNOSIS — E7800 Pure hypercholesterolemia, unspecified: Secondary | ICD-10-CM | POA: Diagnosis not present

## 2024-06-24 DIAGNOSIS — I951 Orthostatic hypotension: Secondary | ICD-10-CM | POA: Diagnosis not present

## 2024-06-24 DIAGNOSIS — I509 Heart failure, unspecified: Secondary | ICD-10-CM | POA: Diagnosis not present

## 2024-06-24 DIAGNOSIS — Z6833 Body mass index (BMI) 33.0-33.9, adult: Secondary | ICD-10-CM | POA: Diagnosis not present

## 2024-06-24 DIAGNOSIS — I420 Dilated cardiomyopathy: Secondary | ICD-10-CM | POA: Diagnosis not present

## 2024-06-24 DIAGNOSIS — D509 Iron deficiency anemia, unspecified: Secondary | ICD-10-CM | POA: Diagnosis not present

## 2024-06-24 DIAGNOSIS — L89152 Pressure ulcer of sacral region, stage 2: Secondary | ICD-10-CM | POA: Diagnosis not present

## 2024-06-24 LAB — VAS US ABI WITH/WO TBI

## 2024-06-24 NOTE — Progress Notes (Signed)
 Patient ID: [name], male   DOB: 06-22-1961, 63 y.o.   MRN: [id number]  Reason for Consult: Follow-up   Referred by [facility]  Subjective:     HPI:  [name] is a 63 y.o. male history of end-stage renal disease currently dialyzing via catheter.  He does have a right upper extremity fistula was placed but he has not used secondary to tremor.  He now has ulcers on both of his feet previously was just on the right and this is healing.  He does not walk much he is able to stand to pivot and ambulate very minimally.  He has not had fevers or chills.  He is followed with Dr. Janit, podiatry.  Past Medical History:  Diagnosis Date   Anxiety    Arthritis    Atrial fibrillation (HCC)    CHF (congestive heart failure), NYHA class III (HCC)    s/p heart transplant   Chronic systolic dysfunction of left ventricle    CVA (cerebral infarction) 2010   Dementia (HCC)    Depression    Diabetes mellitus    PMH; Prior to heart transplant   ESRD on hemodialysis (HCC)    Family history of coronary artery disease    in both parents   GERD (gastroesophageal reflux disease)    Hyperlipidemia    Hypertension    Left bundle branch block    Morbid obesity (HCC)    status post lap band   Myocardial infarction [facility])    prior to heart transplant   Nonischemic cardiomyopathy (HCC)    prior to heart transplant   Obesity (BMI 30-39.9)    Obstructive sleep apnea    no longer needs CPAP after heart transplant per pt   Premature ventricular contractions    Prostate cancer (HCC)    SOB (shortness of breath)    Family History  Problem Relation Age of Onset   Coronary artery disease Father        had, PTCA & CABG   Heart attack Father    Hypertension Father    Diabetes Father    Prostate cancer Father    Coronary artery disease Mother    Heart attack Mother    Hypertension Mother    Stroke Mother    Lupus Mother    Prostate cancer Brother    Coronary artery disease  Brother    Coronary artery disease Sister    Heart attack Sister    Prostate cancer Paternal Uncle    Coronary artery disease Maternal Grandmother    Coronary artery disease Maternal Grandfather    Coronary artery disease Paternal Grandmother    Coronary artery disease Paternal Grandfather    Past Surgical History:  Procedure Laterality Date   A/V FISTULAGRAM Right 05/23/2023   Procedure: A/V Fistulagram;  Surgeon: Lanis Fonda BRAVO, MD;  Location: [facility] INVASIVE CV LAB;  Service: Cardiovascular;  Laterality: Right;   A/V FISTULAGRAM Right 01/27/2024   Procedure: A/V Fistulagram;  Surgeon: Pearline Norman RAMAN, MD;  Location: [facility] INVASIVE CV LAB;  Service: Vascular;  Laterality: Right;   AV FISTULA PLACEMENT Right 07/04/2023   Procedure: ARTERIOVENOUS (AV) FISTULA  REVISION WITH APPLICATION OF ARTEGRAFT RIGHT ARM;  Surgeon: Sheree Penne Bruckner, MD;  Location: [facility] OR;  Service: Vascular;  Laterality: Right;   AV FISTULA PLACEMENT Right 11/19/2023   Procedure: RIGHT BRACHIOCEPHALIC ARTERIOVENOUS (AV) FISTULA CREATION;  Surgeon: Sheree Penne Bruckner, MD;  Location: [facility] OR;  Service: Vascular;  Laterality: Right;  CARDIAC PACEMAKER PLACEMENT  09/21/2009   Biventricular implantable cardioverter-defibrillator implantation      COLONOSCOPY W/ BIOPSIES AND POLYPECTOMY     CYSTOSCOPY WITH FULGERATION N/A 04/27/2021   Procedure: CYSTOSCOPY AND CLOT EVACUATION WITH BLADDER BIOPSY/ FUGARATION OF BLADDER/ FULGARATION OF PROSTATE ;  Surgeon: Nieves Cough, MD;  [facility] ORS;  Service: Urology;  Laterality: N/A;   FOOT SURGERY     left   HEART TRANSPLANT  2014   INSERTION OF DIALYSIS CATHETER Right 07/04/2023   Procedure: INSERTION OF DIALYSIS CATHETER WITH 19 CM PALINDROME;  Surgeon: Sheree Penne Bruckner, MD;  Location: [facility] OR;  Service: Vascular;  Laterality: Right;   LAPAROSCOPIC GASTRIC BANDING  01/27/2007   LEFT VENTRICULAR ASSIST DEVICE     implanted at [facility]   PACEMAKER REMOVAL      PROSTATE BIOPSY     TOTAL HIP ARTHROPLASTY Right 07/22/2017   Procedure: RIGHT TOTAL HIP ARTHROPLASTY ANTERIOR APPROACH;  Surgeon: Fidel Rogue, MD;  Location[facility] OR;  Service: Orthopedics;  Laterality: Right;  Needs RNFA   TOTAL KNEE ARTHROPLASTY Right 07/22/2017    Short Social History:  Social History   Tobacco Use   Smoking status: Never   Smokeless tobacco: Former    Types: Snuff  Substance Use Topics   Alcohol  use: No    Allergies  Allergen Reactions   Heparin  Nausea Only, Swelling and Other (See Comments)    * * HIT * *  SWELLING REACTION UNSPECIFIED   DIAPHORESIS    Iodinated Contrast Media     Unknown reaction   Penicillin G Potassium [Penicillin G] Nausea Only and Other (See Comments)    DIAPHORESIS High Doses    Current Outpatient Medications  Medication Sig Dispense Refill   acetaminophen  (TYLENOL ) 500 MG tablet Take 1,000 mg by mouth 2 (two) times daily as needed for moderate pain (pain score 4-6).     ARIPiprazole  (ABILIFY ) 10 MG tablet Take 10 mg by mouth every morning.     atorvastatin  (LIPITOR) 40 MG tablet Take 1 tablet (40 mg total) by mouth daily. 30 tablet 0   cinacalcet  (SENSIPAR ) 30 MG tablet Take 4 tablets (120 mg total) by mouth daily with supper.     cycloSPORINE  modified (NEORAL ) 100 MG capsule Take 100 mg by mouth See admin instructions. Take with 25 mg for a total of 125 mg twice daily     cycloSPORINE  modified 25 MG CAPS Take 25 mg by mouth See admin instructions. Take with 100 mg for a total of 125 mg twice daily     divalproex  (DEPAKOTE ) 125 MG DR tablet Take 250 mg by mouth in morning and 500 mg by mouth at night (Patient taking differently: Take 250-500 mg by mouth See admin instructions. Take 250 mg by mouth in morning and 500 mg by mouth at night) 180 tablet 11   DULoxetine  (CYMBALTA ) 60 MG capsule TAKE 1 CAPSULE(60 MG) BY MOUTH DAILY 90 capsule 3   ELIQUIS  2.5 MG TABS tablet Take 1 tablet (2.5 mg total) by mouth 2 (two) times daily.  60 tablet    gentamicin  cream (GARAMYCIN ) 0.1 % Apply 1 Application topically 2 (two) times daily. 30 g 1   hydrOXYzine  (ATARAX ) 25 MG tablet Take 25 mg by mouth 2 (two) times daily.     midodrine  (PROAMATINE ) 10 MG tablet Take 1 tablet (10 mg total) by mouth 3 (three) times daily with meals. (Patient taking differently: Take 10 mg by mouth See admin instructions. 10 mg 3 times daily  on Dialysis days Mon Wed Fri, and 10 mg 2 times daily Sun Tue Th Sa) 90 tablet 0   naloxone (NARCAN) nasal spray 4 mg/0.1 mL Place 0.4 mg into the nose once as needed (opioid overdose).     oxyCODONE -acetaminophen  (PERCOCET) 5-325 MG tablet Take 1 tablet by mouth every 6 (six) hours as needed (pain). Do not take with other opiate pain medication 8 tablet 0   predniSONE (DELTASONE) 5 MG tablet Take 5 mg by mouth daily with breakfast.     pregabalin  (LYRICA ) 75 MG capsule Take 1 capsule (75 mg total) by mouth daily. 30 capsule 0   sevelamer  carbonate (RENVELA ) 800 MG tablet Take 2 tablets (1,600 mg total) by mouth 3 (three) times daily with meals. 120 tablet 0   traZODone  (DESYREL ) 100 MG tablet Take 100 mg by mouth at bedtime.     white petrolatum  (VASELINE) OINT Apply 1 Application topically as needed for dry skin (Can put on the right arm). 30 g 0   docusate sodium  (COLACE) 100 MG capsule Take 100 mg by mouth daily.     No current facility-administered medications for this visit.    Review of Systems  Constitutional:  Constitutional negative. HENT: HENT negative.  Eyes: Eyes negative.  Respiratory: Respiratory negative.  Cardiovascular: Cardiovascular negative.  GI: Gastrointestinal negative.  Musculoskeletal: Positive for gait problem.  Skin: Positive for wound.  Neurological:       tremor Hematologic: Hematologic/lymphatic negative.  Psychiatric: Psychiatric negative.        Objective:  Objective   Vitals:   06/24/24 1457  BP: 107/71  Pulse: 94  Temp: 98.4 F (36.9 C)  SpO2: 95%   There is  no height or weight on file to calculate BMI.  Physical Exam HENT:     Head: Normocephalic.     Nose: Nose normal.     Mouth/Throat:     Mouth: Mucous membranes are moist.  Cardiovascular:     Pulses:          Popliteal pulses are 0 on the right side and 1+ on the left side.       Dorsalis pedis pulses are detected w/ Doppler on the right side and detected w/ Doppler on the left side.  Pulmonary:     Effort: Pulmonary effort is normal.  Abdominal:     General: Abdomen is flat.  Musculoskeletal:     Right lower leg: Edema present.     Left lower leg: Edema present.     Comments: Strong right upper arm AV thrill, no swelling right upper extremity  Skin:    General: Skin is warm.     Capillary Refill: Capillary refill takes less than 2 seconds.  Neurological:     General: No focal deficit present.     Mental Status: He is alert.     Data: ABI Findings:  +---------+------------------+-----+-----------+--------+  Right   Rt Pressure (mmHg)IndexWaveform   Comment   +---------+------------------+-----+-----------+--------+  ATA     255               1.65 multiphasic          +---------+------------------+-----+-----------+--------+  PTA     255               1.65 multiphasic          +---------+------------------+-----+-----------+--------+  Great Toe60                0.39                      +---------+------------------+-----+-----------+--------+   +---------+------------------+-----+-----------+-------+  Left    Lt Pressure (mmHg)IndexWaveform   Comment  +---------+------------------+-----+-----------+-------+  Brachial 155                                        +---------+------------------+-----+-----------+-------+  ATA     255               1.65 multiphasic         +---------+------------------+-----+-----------+-------+  PTA     255               1.65 multiphasic          +---------+------------------+-----+-----------+-------+  Great Toe70                0.45                     +---------+------------------+-----+-----------+-------+   +-------+-----------+-----------+------------+------------+  ABI/TBIToday's ABIToday's TBIPrevious ABIPrevious TBI  +-------+-----------+-----------+------------+------------+  Right [place]         0.39       [place]          0.67          +-------+-----------+-----------+------------+------------+  Left  [place]         0.45       [place]          0.69          +-------+-----------+-----------+------------+------------+         Previous ABI on 09/06/23.    Summary:  Right: Resting right ankle-brachial index indicates noncompressible right  lower extremity arteries. The right toe-brachial index is abnormal.  Right toe pressure is >60 mmHg which suggests adequate perfusion for  healing.    Left: Resting left ankle-brachial index indicates noncompressible left  lower extremity arteries. The left toe-brachial index is abnormal.  Left toe pressure is >60 mmHg which suggests adequate perfusion for  healing.       Assessment/Plan:    63 year old male with end-stage renal disease with bilateral lower extremity ulceration with marginal toe pressures for wound healing.  At this time the wounds do appear to be healing particularly on the right.  If he has further issues we would need to proceed with angiography and this was discussed with the patient and his wife today and they demonstrate good understanding.  We will otherwise plan to see them back in 2 to 3 months with bilateral lower extremity arterial duplex and ABIs.  He is okay to use AV fistula in the right although currently he does not want to use it secondary to his tremor.      Penne Lonni [place] MD [facility]

## 2024-06-25 ENCOUNTER — Other Ambulatory Visit: Payer: Self-pay | Admitting: *Deleted

## 2024-06-25 DIAGNOSIS — I7025 Atherosclerosis of native arteries of other extremities with ulceration: Secondary | ICD-10-CM

## 2024-06-25 DIAGNOSIS — I739 Peripheral vascular disease, unspecified: Secondary | ICD-10-CM

## 2024-06-26 DIAGNOSIS — Z1389 Encounter for screening for other disorder: Secondary | ICD-10-CM | POA: Diagnosis not present

## 2024-06-26 DIAGNOSIS — N186 End stage renal disease: Secondary | ICD-10-CM | POA: Diagnosis not present

## 2024-06-26 DIAGNOSIS — M6281 Muscle weakness (generalized): Secondary | ICD-10-CM | POA: Diagnosis not present

## 2024-06-26 DIAGNOSIS — E118 Type 2 diabetes mellitus with unspecified complications: Secondary | ICD-10-CM | POA: Diagnosis not present

## 2024-06-26 DIAGNOSIS — D631 Anemia in chronic kidney disease: Secondary | ICD-10-CM | POA: Diagnosis not present

## 2024-06-26 DIAGNOSIS — R519 Headache, unspecified: Secondary | ICD-10-CM | POA: Diagnosis not present

## 2024-06-26 DIAGNOSIS — Z992 Dependence on renal dialysis: Secondary | ICD-10-CM | POA: Diagnosis not present

## 2024-06-26 DIAGNOSIS — N2581 Secondary hyperparathyroidism of renal origin: Secondary | ICD-10-CM | POA: Diagnosis not present

## 2024-06-26 DIAGNOSIS — D509 Iron deficiency anemia, unspecified: Secondary | ICD-10-CM | POA: Diagnosis not present

## 2024-06-27 DIAGNOSIS — Z992 Dependence on renal dialysis: Secondary | ICD-10-CM | POA: Diagnosis not present

## 2024-06-27 DIAGNOSIS — T8620 Unspecified complication of heart transplant: Secondary | ICD-10-CM | POA: Diagnosis not present

## 2024-06-27 DIAGNOSIS — N186 End stage renal disease: Secondary | ICD-10-CM | POA: Diagnosis not present

## 2024-06-29 DIAGNOSIS — Z992 Dependence on renal dialysis: Secondary | ICD-10-CM | POA: Diagnosis not present

## 2024-06-29 DIAGNOSIS — N186 End stage renal disease: Secondary | ICD-10-CM | POA: Diagnosis not present

## 2024-06-29 DIAGNOSIS — D509 Iron deficiency anemia, unspecified: Secondary | ICD-10-CM | POA: Diagnosis not present

## 2024-06-29 DIAGNOSIS — D631 Anemia in chronic kidney disease: Secondary | ICD-10-CM | POA: Diagnosis not present

## 2024-06-29 DIAGNOSIS — E1129 Type 2 diabetes mellitus with other diabetic kidney complication: Secondary | ICD-10-CM | POA: Diagnosis not present

## 2024-06-29 DIAGNOSIS — N2581 Secondary hyperparathyroidism of renal origin: Secondary | ICD-10-CM | POA: Diagnosis not present

## 2024-06-30 DIAGNOSIS — D631 Anemia in chronic kidney disease: Secondary | ICD-10-CM | POA: Diagnosis not present

## 2024-06-30 DIAGNOSIS — N186 End stage renal disease: Secondary | ICD-10-CM | POA: Diagnosis not present

## 2024-06-30 DIAGNOSIS — I509 Heart failure, unspecified: Secondary | ICD-10-CM | POA: Diagnosis not present

## 2024-06-30 DIAGNOSIS — E1122 Type 2 diabetes mellitus with diabetic chronic kidney disease: Secondary | ICD-10-CM | POA: Diagnosis not present

## 2024-06-30 DIAGNOSIS — F6381 Intermittent explosive disorder: Secondary | ICD-10-CM | POA: Diagnosis not present

## 2024-06-30 DIAGNOSIS — F331 Major depressive disorder, recurrent, moderate: Secondary | ICD-10-CM | POA: Diagnosis not present

## 2024-06-30 DIAGNOSIS — I132 Hypertensive heart and chronic kidney disease with heart failure and with stage 5 chronic kidney disease, or end stage renal disease: Secondary | ICD-10-CM | POA: Diagnosis not present

## 2024-06-30 DIAGNOSIS — F411 Generalized anxiety disorder: Secondary | ICD-10-CM | POA: Diagnosis not present

## 2024-06-30 DIAGNOSIS — I420 Dilated cardiomyopathy: Secondary | ICD-10-CM | POA: Diagnosis not present

## 2024-07-01 ENCOUNTER — Encounter: Payer: Self-pay | Admitting: Podiatry

## 2024-07-01 ENCOUNTER — Ambulatory Visit

## 2024-07-01 ENCOUNTER — Ambulatory Visit (INDEPENDENT_AMBULATORY_CARE_PROVIDER_SITE_OTHER): Admitting: Podiatry

## 2024-07-01 VITALS — Ht 79.0 in | Wt 234.0 lb

## 2024-07-01 DIAGNOSIS — D631 Anemia in chronic kidney disease: Secondary | ICD-10-CM | POA: Diagnosis not present

## 2024-07-01 DIAGNOSIS — M79675 Pain in left toe(s): Secondary | ICD-10-CM

## 2024-07-01 DIAGNOSIS — N2581 Secondary hyperparathyroidism of renal origin: Secondary | ICD-10-CM | POA: Diagnosis not present

## 2024-07-01 DIAGNOSIS — L97511 Non-pressure chronic ulcer of other part of right foot limited to breakdown of skin: Secondary | ICD-10-CM

## 2024-07-01 DIAGNOSIS — E11622 Type 2 diabetes mellitus with other skin ulcer: Secondary | ICD-10-CM

## 2024-07-01 DIAGNOSIS — M79674 Pain in right toe(s): Secondary | ICD-10-CM

## 2024-07-01 DIAGNOSIS — N186 End stage renal disease: Secondary | ICD-10-CM | POA: Diagnosis not present

## 2024-07-01 DIAGNOSIS — L97319 Non-pressure chronic ulcer of right ankle with unspecified severity: Secondary | ICD-10-CM

## 2024-07-01 DIAGNOSIS — E1129 Type 2 diabetes mellitus with other diabetic kidney complication: Secondary | ICD-10-CM | POA: Diagnosis not present

## 2024-07-01 DIAGNOSIS — B351 Tinea unguium: Secondary | ICD-10-CM | POA: Diagnosis not present

## 2024-07-01 DIAGNOSIS — D509 Iron deficiency anemia, unspecified: Secondary | ICD-10-CM | POA: Diagnosis not present

## 2024-07-01 DIAGNOSIS — Z992 Dependence on renal dialysis: Secondary | ICD-10-CM | POA: Diagnosis not present

## 2024-07-01 NOTE — Progress Notes (Signed)
 Chief Complaint  Patient presents with   Wound Check    Pt is here due to sore on the top of the foot, seen PCP for this issue and was recommend to come here to f/u.    Subjective:  63 y.o. male with PMHx of diabetes mellitus, ESRD on dialysis, CHF, nonambulatory in a wheelchair presenting today with his spouse for new onset of ulcers to the dorsum of the right foot  Past Medical History:  Diagnosis Date   Anxiety    Arthritis    Atrial fibrillation (HCC)    CHF (congestive heart failure), NYHA class III (HCC)    s/p heart transplant   Chronic systolic dysfunction of left ventricle    CVA (cerebral infarction) 2010   Dementia (HCC)    Depression    Diabetes mellitus    PMH; Prior to heart transplant   ESRD on hemodialysis (HCC)    Family history of coronary artery disease    in both parents   GERD (gastroesophageal reflux disease)    Hyperlipidemia    Hypertension    Left bundle branch block    Morbid obesity (HCC)    status post lap band   Myocardial infarction [facility])    prior to heart transplant   Nonischemic cardiomyopathy (HCC)    prior to heart transplant   Obesity (BMI 30-39.9)    Obstructive sleep apnea    no longer needs CPAP after heart transplant per pt   Premature ventricular contractions    Prostate cancer (HCC)    SOB (shortness of breath)     Past Surgical History:  Procedure Laterality Date   A/V FISTULAGRAM Right 05/23/2023   Procedure: A/V Fistulagram;  Surgeon: Lanis Fonda BRAVO, MD;  Location: [facility] INVASIVE CV LAB;  Service: Cardiovascular;  Laterality: Right;   A/V FISTULAGRAM Right 01/27/2024   Procedure: A/V Fistulagram;  Surgeon: Pearline Norman RAMAN, MD;  Location: [facility] INVASIVE CV LAB;  Service: Vascular;  Laterality: Right;   AV FISTULA PLACEMENT Right 07/04/2023   Procedure: ARTERIOVENOUS (AV) FISTULA  REVISION WITH APPLICATION OF ARTEGRAFT RIGHT ARM;  Surgeon: Sheree Penne Bruckner, MD;  Location: [facility] OR;  Service: Vascular;  Laterality: Right;    AV FISTULA PLACEMENT Right 11/19/2023   Procedure: RIGHT BRACHIOCEPHALIC ARTERIOVENOUS (AV) FISTULA CREATION;  Surgeon: Sheree Penne Bruckner, MD;  Location: [facility] OR;  Service: Vascular;  Laterality: Right;   CARDIAC PACEMAKER PLACEMENT  09/21/2009   Biventricular implantable cardioverter-defibrillator implantation      COLONOSCOPY W/ BIOPSIES AND POLYPECTOMY     CYSTOSCOPY WITH FULGERATION N/A 04/27/2021   Procedure: CYSTOSCOPY AND CLOT EVACUATION WITH BLADDER BIOPSY/ FUGARATION OF BLADDER/ FULGARATION OF PROSTATE ;  Surgeon: Nieves Cough, MD;  Location: [facility] ORS;  Service: Urology;  Laterality: N/A;   FOOT SURGERY     left   HEART TRANSPLANT  2014   INSERTION OF DIALYSIS CATHETER Right 07/04/2023   Procedure: INSERTION OF DIALYSIS CATHETER WITH 19 CM PALINDROME;  Surgeon: Sheree Penne Bruckner, MD;  Location: [facility] OR;  Service: Vascular;  Laterality: Right;   LAPAROSCOPIC GASTRIC BANDING  01/27/2007   LEFT VENTRICULAR ASSIST DEVICE     implanted at [facility]   PACEMAKER REMOVAL     PROSTATE BIOPSY     TOTAL HIP ARTHROPLASTY Right 07/22/2017   Procedure: RIGHT TOTAL HIP ARTHROPLASTY ANTERIOR APPROACH;  Surgeon: Fidel Rogue, MD;  Location: [facility] OR;  Service: Orthopedics;  Laterality: Right;  Needs RNFA   TOTAL KNEE ARTHROPLASTY Right 07/22/2017    Allergies  Allergen Reactions   Heparin  Nausea Only, Swelling and Other (See Comments)    * * HIT * *  SWELLING REACTION UNSPECIFIED   DIAPHORESIS    Iodinated Contrast Media     Unknown reaction   Penicillin G Potassium [Penicillin G] Nausea Only and Other (See Comments)    DIAPHORESIS High Doses      B/L legs; feet 07/01/2024  Objective/Physical Exam General: The patient is alert and oriented x3 in no acute distress.  Dermatology:  Superficial wound noted to the anterior aspect of the right ankle measuring approximately 1.0 x 1.0 x 0.2 cm.  Granular wound base.  No malodor or concern clinically for acute infection.  No  drainage.  Good potential for healing Hyperkeratotic dystrophic nails also noted 1-5 bilateral  Vascular: Chronic edema bilateral lower extremities VAS US  ABI WITH/WO TBI 09/06/2023 ABI Findings:  +---------+------------------+-----+---------+----------------------+  Right   Rt Pressure (mmHg)IndexWaveform Comment                 +---------+------------------+-----+---------+----------------------+  Brachial 101                    biphasic HX of HD access in RUE  +---------+------------------+-----+---------+----------------------+  PTA                            triphasicnon compressible        +---------+------------------+-----+---------+----------------------+  DP                             triphasicnon compressible        +---------+------------------+-----+---------+----------------------+  Great Toe73                0.67 Normal                           +---------+------------------+-----+---------+----------------------+   +---------+------------------+-----+---------+-------+  Left    Lt Pressure (mmHg)IndexWaveform Comment  +---------+------------------+-----+---------+-------+  Brachial 109                    triphasic         +---------+------------------+-----+---------+-------+  PTA                            triphasic         +---------+------------------+-----+---------+-------+  DP                             triphasic         +---------+------------------+-----+---------+-------+  Great Toe75                0.69 Normal            +---------+------------------+-----+---------+-------+  Arterial wall calcification precludes accurate ankle pressures and ABIs.  Summary:  Right: Resting right ankle-brachial index indicates noncompressible right  lower extremity arteries. The right toe-brachial index is mildly reduced.  Left: Resting left ankle-brachial index indicates noncompressible left  lower extremity  arteries. The left toe-brachial index is mildly reduced.   Neurological: Light touch and protective threshold diminished bilaterally.   Musculoskeletal Exam: nonambulatory in wheelchair.  No prior amputations  Assessment: 1.  Ulcer right anterior ankle secondary to PVD and diabetes mellitus 2. diabetes mellitus w/ peripheral neuropathy 3.  Chronic edema bilateral lower extremities 4.  Pain due to onychomycosis of toenails both  Plan of Care:  -Patient was evaluated.   -Mechanical debridement of nails 1-5 bilateral performed using a nail nipper without incident or bleeding - Light debridement of the right anterior ankle ulcer was performed today using a tissue nipper -Recommend antibiotic ointment and a light dressing daily -Patient has an appointment in 3 months for routine footcare with Dr. May.   Thresa EMERSON Sar, DPM [facility]  Dr. Thresa EMERSON Sar, DPM    [address] [place], [place] [address]                Office [phone]  Fax [phone]

## 2024-07-03 DIAGNOSIS — N186 End stage renal disease: Secondary | ICD-10-CM | POA: Diagnosis not present

## 2024-07-03 DIAGNOSIS — E1129 Type 2 diabetes mellitus with other diabetic kidney complication: Secondary | ICD-10-CM | POA: Diagnosis not present

## 2024-07-03 DIAGNOSIS — D509 Iron deficiency anemia, unspecified: Secondary | ICD-10-CM | POA: Diagnosis not present

## 2024-07-03 DIAGNOSIS — N2581 Secondary hyperparathyroidism of renal origin: Secondary | ICD-10-CM | POA: Diagnosis not present

## 2024-07-03 DIAGNOSIS — Z992 Dependence on renal dialysis: Secondary | ICD-10-CM | POA: Diagnosis not present

## 2024-07-03 DIAGNOSIS — D631 Anemia in chronic kidney disease: Secondary | ICD-10-CM | POA: Diagnosis not present

## 2024-07-06 DIAGNOSIS — N186 End stage renal disease: Secondary | ICD-10-CM | POA: Diagnosis not present

## 2024-07-06 DIAGNOSIS — Z992 Dependence on renal dialysis: Secondary | ICD-10-CM | POA: Diagnosis not present

## 2024-07-06 DIAGNOSIS — D631 Anemia in chronic kidney disease: Secondary | ICD-10-CM | POA: Diagnosis not present

## 2024-07-06 DIAGNOSIS — E1129 Type 2 diabetes mellitus with other diabetic kidney complication: Secondary | ICD-10-CM | POA: Diagnosis not present

## 2024-07-06 DIAGNOSIS — D509 Iron deficiency anemia, unspecified: Secondary | ICD-10-CM | POA: Diagnosis not present

## 2024-07-06 DIAGNOSIS — N2581 Secondary hyperparathyroidism of renal origin: Secondary | ICD-10-CM | POA: Diagnosis not present

## 2024-07-08 DIAGNOSIS — Z992 Dependence on renal dialysis: Secondary | ICD-10-CM | POA: Diagnosis not present

## 2024-07-08 DIAGNOSIS — N186 End stage renal disease: Secondary | ICD-10-CM | POA: Diagnosis not present

## 2024-07-08 DIAGNOSIS — E1129 Type 2 diabetes mellitus with other diabetic kidney complication: Secondary | ICD-10-CM | POA: Diagnosis not present

## 2024-07-08 DIAGNOSIS — N2581 Secondary hyperparathyroidism of renal origin: Secondary | ICD-10-CM | POA: Diagnosis not present

## 2024-07-08 DIAGNOSIS — D509 Iron deficiency anemia, unspecified: Secondary | ICD-10-CM | POA: Diagnosis not present

## 2024-07-08 DIAGNOSIS — D631 Anemia in chronic kidney disease: Secondary | ICD-10-CM | POA: Diagnosis not present

## 2024-07-09 DIAGNOSIS — D631 Anemia in chronic kidney disease: Secondary | ICD-10-CM | POA: Diagnosis not present

## 2024-07-09 DIAGNOSIS — I509 Heart failure, unspecified: Secondary | ICD-10-CM | POA: Diagnosis not present

## 2024-07-09 DIAGNOSIS — E1122 Type 2 diabetes mellitus with diabetic chronic kidney disease: Secondary | ICD-10-CM | POA: Diagnosis not present

## 2024-07-09 DIAGNOSIS — I420 Dilated cardiomyopathy: Secondary | ICD-10-CM | POA: Diagnosis not present

## 2024-07-09 DIAGNOSIS — I132 Hypertensive heart and chronic kidney disease with heart failure and with stage 5 chronic kidney disease, or end stage renal disease: Secondary | ICD-10-CM | POA: Diagnosis not present

## 2024-07-09 DIAGNOSIS — N186 End stage renal disease: Secondary | ICD-10-CM | POA: Diagnosis not present

## 2024-07-10 DIAGNOSIS — N186 End stage renal disease: Secondary | ICD-10-CM | POA: Diagnosis not present

## 2024-07-10 DIAGNOSIS — D509 Iron deficiency anemia, unspecified: Secondary | ICD-10-CM | POA: Diagnosis not present

## 2024-07-10 DIAGNOSIS — D631 Anemia in chronic kidney disease: Secondary | ICD-10-CM | POA: Diagnosis not present

## 2024-07-10 DIAGNOSIS — E1129 Type 2 diabetes mellitus with other diabetic kidney complication: Secondary | ICD-10-CM | POA: Diagnosis not present

## 2024-07-10 DIAGNOSIS — Z992 Dependence on renal dialysis: Secondary | ICD-10-CM | POA: Diagnosis not present

## 2024-07-10 DIAGNOSIS — N2581 Secondary hyperparathyroidism of renal origin: Secondary | ICD-10-CM | POA: Diagnosis not present

## 2024-07-13 DIAGNOSIS — D631 Anemia in chronic kidney disease: Secondary | ICD-10-CM | POA: Diagnosis not present

## 2024-07-13 DIAGNOSIS — N186 End stage renal disease: Secondary | ICD-10-CM | POA: Diagnosis not present

## 2024-07-13 DIAGNOSIS — Z1211 Encounter for screening for malignant neoplasm of colon: Secondary | ICD-10-CM | POA: Diagnosis not present

## 2024-07-13 DIAGNOSIS — Z992 Dependence on renal dialysis: Secondary | ICD-10-CM | POA: Diagnosis not present

## 2024-07-13 DIAGNOSIS — E1129 Type 2 diabetes mellitus with other diabetic kidney complication: Secondary | ICD-10-CM | POA: Diagnosis not present

## 2024-07-13 DIAGNOSIS — N2581 Secondary hyperparathyroidism of renal origin: Secondary | ICD-10-CM | POA: Diagnosis not present

## 2024-07-13 DIAGNOSIS — D509 Iron deficiency anemia, unspecified: Secondary | ICD-10-CM | POA: Diagnosis not present

## 2024-07-13 DIAGNOSIS — Z1212 Encounter for screening for malignant neoplasm of rectum: Secondary | ICD-10-CM | POA: Diagnosis not present

## 2024-07-14 DIAGNOSIS — I420 Dilated cardiomyopathy: Secondary | ICD-10-CM | POA: Diagnosis not present

## 2024-07-14 DIAGNOSIS — E1122 Type 2 diabetes mellitus with diabetic chronic kidney disease: Secondary | ICD-10-CM | POA: Diagnosis not present

## 2024-07-14 DIAGNOSIS — D631 Anemia in chronic kidney disease: Secondary | ICD-10-CM | POA: Diagnosis not present

## 2024-07-14 DIAGNOSIS — N186 End stage renal disease: Secondary | ICD-10-CM | POA: Diagnosis not present

## 2024-07-14 DIAGNOSIS — I132 Hypertensive heart and chronic kidney disease with heart failure and with stage 5 chronic kidney disease, or end stage renal disease: Secondary | ICD-10-CM | POA: Diagnosis not present

## 2024-07-14 DIAGNOSIS — I509 Heart failure, unspecified: Secondary | ICD-10-CM | POA: Diagnosis not present

## 2024-07-16 DIAGNOSIS — Z992 Dependence on renal dialysis: Secondary | ICD-10-CM | POA: Diagnosis not present

## 2024-07-16 DIAGNOSIS — E1129 Type 2 diabetes mellitus with other diabetic kidney complication: Secondary | ICD-10-CM | POA: Diagnosis not present

## 2024-07-16 DIAGNOSIS — N186 End stage renal disease: Secondary | ICD-10-CM | POA: Diagnosis not present

## 2024-07-16 DIAGNOSIS — N2581 Secondary hyperparathyroidism of renal origin: Secondary | ICD-10-CM | POA: Diagnosis not present

## 2024-07-16 DIAGNOSIS — D631 Anemia in chronic kidney disease: Secondary | ICD-10-CM | POA: Diagnosis not present

## 2024-07-16 DIAGNOSIS — D509 Iron deficiency anemia, unspecified: Secondary | ICD-10-CM | POA: Diagnosis not present

## 2024-07-17 DIAGNOSIS — N2581 Secondary hyperparathyroidism of renal origin: Secondary | ICD-10-CM | POA: Diagnosis not present

## 2024-07-17 DIAGNOSIS — I509 Heart failure, unspecified: Secondary | ICD-10-CM | POA: Diagnosis not present

## 2024-07-17 DIAGNOSIS — E1122 Type 2 diabetes mellitus with diabetic chronic kidney disease: Secondary | ICD-10-CM | POA: Diagnosis not present

## 2024-07-17 DIAGNOSIS — E1129 Type 2 diabetes mellitus with other diabetic kidney complication: Secondary | ICD-10-CM | POA: Diagnosis not present

## 2024-07-17 DIAGNOSIS — Z992 Dependence on renal dialysis: Secondary | ICD-10-CM | POA: Diagnosis not present

## 2024-07-17 DIAGNOSIS — D631 Anemia in chronic kidney disease: Secondary | ICD-10-CM | POA: Diagnosis not present

## 2024-07-17 DIAGNOSIS — D509 Iron deficiency anemia, unspecified: Secondary | ICD-10-CM | POA: Diagnosis not present

## 2024-07-17 DIAGNOSIS — I132 Hypertensive heart and chronic kidney disease with heart failure and with stage 5 chronic kidney disease, or end stage renal disease: Secondary | ICD-10-CM | POA: Diagnosis not present

## 2024-07-17 DIAGNOSIS — N186 End stage renal disease: Secondary | ICD-10-CM | POA: Diagnosis not present

## 2024-07-17 DIAGNOSIS — I420 Dilated cardiomyopathy: Secondary | ICD-10-CM | POA: Diagnosis not present

## 2024-07-19 DIAGNOSIS — E1129 Type 2 diabetes mellitus with other diabetic kidney complication: Secondary | ICD-10-CM | POA: Diagnosis not present

## 2024-07-19 DIAGNOSIS — N2581 Secondary hyperparathyroidism of renal origin: Secondary | ICD-10-CM | POA: Diagnosis not present

## 2024-07-19 DIAGNOSIS — D631 Anemia in chronic kidney disease: Secondary | ICD-10-CM | POA: Diagnosis not present

## 2024-07-19 DIAGNOSIS — N186 End stage renal disease: Secondary | ICD-10-CM | POA: Diagnosis not present

## 2024-07-19 DIAGNOSIS — Z992 Dependence on renal dialysis: Secondary | ICD-10-CM | POA: Diagnosis not present

## 2024-07-19 DIAGNOSIS — D509 Iron deficiency anemia, unspecified: Secondary | ICD-10-CM | POA: Diagnosis not present

## 2024-07-21 DIAGNOSIS — E1129 Type 2 diabetes mellitus with other diabetic kidney complication: Secondary | ICD-10-CM | POA: Diagnosis not present

## 2024-07-21 DIAGNOSIS — N2581 Secondary hyperparathyroidism of renal origin: Secondary | ICD-10-CM | POA: Diagnosis not present

## 2024-07-21 DIAGNOSIS — Z992 Dependence on renal dialysis: Secondary | ICD-10-CM | POA: Diagnosis not present

## 2024-07-21 DIAGNOSIS — N186 End stage renal disease: Secondary | ICD-10-CM | POA: Diagnosis not present

## 2024-07-21 DIAGNOSIS — D631 Anemia in chronic kidney disease: Secondary | ICD-10-CM | POA: Diagnosis not present

## 2024-07-21 DIAGNOSIS — D509 Iron deficiency anemia, unspecified: Secondary | ICD-10-CM | POA: Diagnosis not present

## 2024-07-24 DIAGNOSIS — N186 End stage renal disease: Secondary | ICD-10-CM | POA: Diagnosis not present

## 2024-07-24 DIAGNOSIS — Z992 Dependence on renal dialysis: Secondary | ICD-10-CM | POA: Diagnosis not present

## 2024-08-02 LAB — COLOGUARD: COLOGUARD: NEGATIVE

## 2024-08-07 ENCOUNTER — Other Ambulatory Visit: Payer: Self-pay

## 2024-08-07 ENCOUNTER — Encounter (HOSPITAL_COMMUNITY): Payer: Self-pay

## 2024-08-07 ENCOUNTER — Emergency Department (HOSPITAL_COMMUNITY)
Admission: EM | Admit: 2024-08-07 | Discharge: 2024-08-08 | Disposition: A | Attending: Emergency Medicine | Admitting: Emergency Medicine

## 2024-08-07 DIAGNOSIS — Z7901 Long term (current) use of anticoagulants: Secondary | ICD-10-CM | POA: Diagnosis not present

## 2024-08-07 DIAGNOSIS — Z992 Dependence on renal dialysis: Secondary | ICD-10-CM | POA: Diagnosis not present

## 2024-08-07 DIAGNOSIS — Q613 Polycystic kidney, unspecified: Secondary | ICD-10-CM | POA: Insufficient documentation

## 2024-08-07 DIAGNOSIS — N186 End stage renal disease: Secondary | ICD-10-CM | POA: Insufficient documentation

## 2024-08-07 DIAGNOSIS — N3289 Other specified disorders of bladder: Secondary | ICD-10-CM | POA: Diagnosis not present

## 2024-08-07 LAB — CBC WITH DIFFERENTIAL/PLATELET
Abs Immature Granulocytes: 0.09 K/uL — ABNORMAL HIGH (ref 0.00–0.07)
Basophils Absolute: 0.1 K/uL (ref 0.0–0.1)
Basophils Relative: 1 %
Eosinophils Absolute: 0.1 K/uL (ref 0.0–0.5)
Eosinophils Relative: 1 %
HCT: 31.1 % — ABNORMAL LOW (ref 39.0–52.0)
Hemoglobin: 10 g/dL — ABNORMAL LOW (ref 13.0–17.0)
Immature Granulocytes: 1 %
Lymphocytes Relative: 12 %
Lymphs Abs: 1 K/uL (ref 0.7–4.0)
MCH: 32.3 pg (ref 26.0–34.0)
MCHC: 32.2 g/dL (ref 30.0–36.0)
MCV: 100.3 fL — ABNORMAL HIGH (ref 80.0–100.0)
Monocytes Absolute: 0.7 K/uL (ref 0.1–1.0)
Monocytes Relative: 8 %
Neutro Abs: 6.3 K/uL (ref 1.7–7.7)
Neutrophils Relative %: 77 %
Platelets: 220 K/uL (ref 150–400)
RBC: 3.1 MIL/uL — ABNORMAL LOW (ref 4.22–5.81)
RDW: 17.1 % — ABNORMAL HIGH (ref 11.5–15.5)
WBC: 8.2 K/uL (ref 4.0–10.5)
nRBC: 0 % (ref 0.0–0.2)

## 2024-08-07 LAB — COMPREHENSIVE METABOLIC PANEL WITH GFR
ALT: 8 U/L (ref 0–44)
AST: 14 U/L — ABNORMAL LOW (ref 15–41)
Albumin: 3.9 g/dL (ref 3.5–5.0)
Alkaline Phosphatase: 93 U/L (ref 38–126)
Anion gap: 11 (ref 5–15)
BUN: 29 mg/dL — ABNORMAL HIGH (ref 8–23)
CO2: 29 mmol/L (ref 22–32)
Calcium: 10 mg/dL (ref 8.9–10.3)
Chloride: 97 mmol/L — ABNORMAL LOW (ref 98–111)
Creatinine, Ser: 5.29 mg/dL — ABNORMAL HIGH (ref 0.61–1.24)
GFR, Estimated: 11 mL/min — ABNORMAL LOW (ref >60)
Glucose, Bld: 97 mg/dL (ref 70–99)
Potassium: 5.2 mmol/L — ABNORMAL HIGH (ref 3.5–5.1)
Sodium: 138 mmol/L (ref 135–145)
Total Bilirubin: 0.4 mg/dL (ref 0.0–1.2)
Total Protein: 7.4 g/dL (ref 6.5–8.1)

## 2024-08-07 NOTE — ED Triage Notes (Signed)
 Pt is having penis pain and blood with clots coming out since Wednesday. Pt is a dialysis pt and reports prior prostate issues.

## 2024-08-08 ENCOUNTER — Emergency Department (HOSPITAL_COMMUNITY)

## 2024-08-08 ENCOUNTER — Encounter (HOSPITAL_COMMUNITY): Payer: Self-pay | Admitting: Emergency Medicine

## 2024-08-08 DIAGNOSIS — N281 Cyst of kidney, acquired: Secondary | ICD-10-CM | POA: Diagnosis not present

## 2024-08-08 DIAGNOSIS — R109 Unspecified abdominal pain: Secondary | ICD-10-CM | POA: Diagnosis not present

## 2024-08-08 DIAGNOSIS — N200 Calculus of kidney: Secondary | ICD-10-CM | POA: Diagnosis not present

## 2024-08-08 DIAGNOSIS — N3289 Other specified disorders of bladder: Secondary | ICD-10-CM | POA: Diagnosis not present

## 2024-08-08 MED ORDER — TAMSULOSIN HCL 0.4 MG PO CAPS: 0.4000 mg | ORAL_CAPSULE | Freq: Every day | ORAL | 0 refills | Status: AC

## 2024-08-08 MED ORDER — TAMSULOSIN HCL 0.4 MG PO CAPS
0.4000 mg | ORAL_CAPSULE | ORAL | Status: AC
  Administered 2024-08-08: 0.4 mg via ORAL
  Filled 2024-08-08: qty 1

## 2024-08-08 MED ORDER — OXYCODONE-ACETAMINOPHEN 5-325 MG PO TABS
1.0000 | ORAL_TABLET | Freq: Once | ORAL | Status: AC
  Administered 2024-08-08: 1 via ORAL
  Filled 2024-08-08: qty 1

## 2024-08-08 MED ORDER — LIDOCAINE 5 % EX PTCH
2.0000 | MEDICATED_PATCH | CUTANEOUS | Status: DC
  Administered 2024-08-08: 2 via TRANSDERMAL
  Filled 2024-08-08: qty 2

## 2024-08-08 NOTE — ED Provider Notes (Signed)
 [place] EMERGENCY DEPARTMENT AT [facility] Provider Note   CSN: [id number] Arrival date & time: 08/07/24  2151     Patient presents with: Penis Pain   [name] is a 63 y.o. male.   The history is provided by the patient.  Penis Pain This is a new problem. The current episode started yesterday. The problem occurs constantly. The problem has not changed since onset.Pertinent negatives include no chest pain, no abdominal pain, no headaches and no shortness of breath. Nothing aggravates the symptoms. Nothing relieves the symptoms. He has tried nothing for the symptoms. The treatment provided no relief.  Patient with PCKD on Eliquis  and ESRD on dialysis MWF who does not make urine presents with blood and clots per meatus      Prior to Admission medications  Medication Sig Start Date End Date Taking? Authorizing Provider  tamsulosin  (FLOMAX ) 0.4 MG CAPS capsule Take 1 capsule (0.4 mg total) by mouth daily. 08/08/24  Yes Lucinda Spells, MD  acetaminophen  (TYLENOL ) 500 MG tablet Take 1,000 mg by mouth 2 (two) times daily as needed for moderate pain (pain score 4-6).    [provider]  ARIPiprazole  (ABILIFY ) 10 MG tablet Take 10 mg by mouth every morning.    [provider]  atorvastatin  (LIPITOR) 40 MG tablet Take 1 tablet (40 mg total) by mouth daily. 09/16/23   Marylu Gee, DO  cinacalcet  (SENSIPAR ) 30 MG tablet Take 4 tablets (120 mg total) by mouth daily with supper. 09/16/23   Marylu Gee, DO  cycloSPORINE  modified (NEORAL ) 100 MG capsule Take 100 mg by mouth See admin instructions. Take with 25 mg for a total of 125 mg twice daily    [provider]  cycloSPORINE  modified 25 MG CAPS Take 25 mg by mouth See admin instructions. Take with 100 mg for a total of 125 mg twice daily 05/09/23   [provider]  divalproex  (DEPAKOTE ) 125 MG DR tablet Take 250 mg by mouth in morning and 500 mg by mouth at night Patient taking differently:  Take 250-500 mg by mouth See admin instructions. Take 250 mg by mouth in morning and 500 mg by mouth at night 12/17/22   Onita Duos, MD  docusate sodium  (COLACE) 100 MG capsule Take 100 mg by mouth daily.    [provider]  DULoxetine  (CYMBALTA ) 60 MG capsule TAKE 1 CAPSULE(60 MG) BY MOUTH DAILY 10/23/23   Onita Duos, MD  ELIQUIS  2.5 MG TABS tablet Take 1 tablet (2.5 mg total) by mouth 2 (two) times daily. 04/30/21   Nieves Cough, MD  gentamicin  cream (GARAMYCIN ) 0.1 % Apply 1 Application topically 2 (two) times daily. 10/02/23   Janit Thresa CHRISTELLA, DPM  hydrOXYzine  (ATARAX ) 25 MG tablet Take 25 mg by mouth 2 (two) times daily.    [provider]  midodrine  (PROAMATINE ) 10 MG tablet Take 1 tablet (10 mg total) by mouth 3 (three) times daily with meals. Patient taking differently: Take 10 mg by mouth See admin instructions. 10 mg 3 times daily on Dialysis days Mon Wed Fri, and 10 mg 2 times daily Sun Tue Th Sa 09/16/23   Marylu Gee, DO  naloxone [facility]) nasal spray 4 mg/0.1 mL Place 0.4 mg into the nose once as needed (opioid overdose).    [provider]  oxyCODONE -acetaminophen  (PERCOCET) 5-325 MG tablet Take 1 tablet by mouth every 6 (six) hours as needed (pain). Do not take with other opiate pain medication 11/19/23 11/18/24  Modesto Lukes  J, PA-C  predniSONE (DELTASONE) 5 MG tablet Take 5 mg by mouth daily with breakfast.    [provider]  pregabalin  (LYRICA ) 75 MG capsule Take 1 capsule (75 mg total) by mouth daily. 09/16/23   Marylu Gee, DO  sevelamer  carbonate (RENVELA ) 800 MG tablet Take 2 tablets (1,600 mg total) by mouth 3 (three) times daily with meals. 11/11/18   Shona Terry SAILOR, DO  traZODone  (DESYREL ) 100 MG tablet Take 100 mg by mouth at bedtime. 08/18/22   [provider]  white petrolatum  (VASELINE) OINT Apply 1 Application topically as needed for dry skin (Can put on the right arm). 11/26/23   Gregary Sharper, MD    Allergies: Heparin ,  Iodinated contrast media, and Penicillin g potassium [penicillin g]    Review of Systems  Constitutional:  Negative for fever.  Respiratory:  Negative for shortness of breath.   Cardiovascular:  Negative for chest pain.  Gastrointestinal:  Negative for abdominal pain.  Genitourinary:  Positive for hematuria and penile pain.  Neurological:  Negative for headaches.  All other systems reviewed and are negative.   Updated Vital Signs BP 132/88   Pulse 82   Temp 98 F (36.7 C) (Oral)   Resp 16   SpO2 93%   Physical Exam Vitals and nursing note reviewed.  Constitutional:      General: He is not in acute distress.    Appearance: Normal appearance. He is well-developed. He is not diaphoretic.  HENT:     Head: Normocephalic and atraumatic.     Nose: Nose normal.  Eyes:     Conjunctiva/sclera: Conjunctivae normal.     Pupils: Pupils are equal, round, and reactive to light.  Cardiovascular:     Rate and Rhythm: Normal rate and regular rhythm.     Pulses: Normal pulses.     Heart sounds: Normal heart sounds.  Pulmonary:     Effort: Pulmonary effort is normal.     Breath sounds: Normal breath sounds. No wheezing or rales.  Abdominal:     General: Bowel sounds are normal.     Palpations: Abdomen is soft.     Tenderness: There is no abdominal tenderness. There is no guarding or rebound.  Musculoskeletal:        General: Normal range of motion.     Cervical back: Normal range of motion and neck supple.  Skin:    General: Skin is warm and dry.     Capillary Refill: Capillary refill takes less than 2 seconds.  Neurological:     General: No focal deficit present.     Mental Status: He is alert and oriented to person, place, and time.     Deep Tendon Reflexes: Reflexes normal.  Psychiatric:        Mood and Affect: Mood normal.     (all labs ordered are listed, but only abnormal results are displayed) Results for orders placed or performed during the hospital encounter of  08/07/24  CBC with Differential   Collection Time: 08/07/24 10:13 PM  Result Value Ref Range   WBC 8.2 4.0 - 10.5 K/uL   RBC 3.10 (L) 4.22 - 5.81 MIL/uL   Hemoglobin 10.0 (L) 13.0 - 17.0 g/dL   HCT 68.8 (L) 60.9 - 47.9 %   MCV 100.3 (H) 80.0 - 100.0 fL   MCH 32.3 26.0 - 34.0 pg   MCHC 32.2 30.0 - 36.0 g/dL   RDW 82.8 (H) 88.4 - 84.4 %   Platelets 220 150 -  400 K/uL   nRBC 0.0 0.0 - 0.2 %   Neutrophils Relative % 77 %   Neutro Abs 6.3 1.7 - 7.7 K/uL   Lymphocytes Relative 12 %   Lymphs Abs 1.0 0.7 - 4.0 K/uL   Monocytes Relative 8 %   Monocytes Absolute 0.7 0.1 - 1.0 K/uL   Eosinophils Relative 1 %   Eosinophils Absolute 0.1 0.0 - 0.5 K/uL   Basophils Relative 1 %   Basophils Absolute 0.1 0.0 - 0.1 K/uL   Immature Granulocytes 1 %   Abs Immature Granulocytes 0.09 (H) 0.00 - 0.07 K/uL  Comprehensive metabolic panel   Collection Time: 08/07/24 10:13 PM  Result Value Ref Range   Sodium 138 135 - 145 mmol/L   Potassium 5.2 (H) 3.5 - 5.1 mmol/L   Chloride 97 (L) 98 - 111 mmol/L   CO2 29 22 - 32 mmol/L   Glucose, Bld 97 70 - 99 mg/dL   BUN 29 (H) 8 - 23 mg/dL   Creatinine, Ser 4.70 (H) 0.61 - 1.24 mg/dL   Calcium  10.0 8.9 - 10.3 mg/dL   Total Protein 7.4 6.5 - 8.1 g/dL   Albumin  3.9 3.5 - 5.0 g/dL   AST 14 (L) 15 - 41 U/L   ALT 8 0 - 44 U/L   Alkaline Phosphatase 93 38 - 126 U/L   Total Bilirubin 0.4 0.0 - 1.2 mg/dL   GFR, Estimated 11 (L) >60 mL/min   Anion gap 11 5 - 15   CT Renal Stone Study Result Date: 08/08/2024 EXAM: CT ABDOMEN AND PELVIS WITHOUT CONTRAST 08/08/2024 01:06:40 AM TECHNIQUE: CT of the abdomen and pelvis was performed without the administration of intravenous contrast. Multiplanar reformatted images are provided for review. Automated exposure control, iterative reconstruction, and/or weight-based adjustment of the mA/kV was utilized to reduce the radiation dose to as low as reasonably achievable. COMPARISON: 02/08/2024 CLINICAL HISTORY: Abdominal/flank  pain, stone suspected. FINDINGS: LOWER CHEST: Patchy lingular and right lower lobe opacities, chronic, favoring scarring. LIVER: The liver is unremarkable. GALLBLADDER AND BILE DUCTS: Gallbladder is unremarkable. No biliary ductal dilatation. SPLEEN: No acute abnormality. PANCREAS: No acute abnormality. ADRENAL GLANDS: No acute abnormality. KIDNEYS, URETERS AND BLADDER: Numerous bilateral renal cysts of varying sizes and complexity, poorly evaluated on unenhanced CT, but compatible with polycystic kidney disease. Bilateral nonobstructing renal calculi measuring up to 3 mm. No hydronephrosis. No perinephric or periureteral stranding. Urinary bladder is underdistended but layering bladder hemorrhage is suspected. GI AND BOWEL: Postsurgical changes related to gastric sleeve. Normal appendix (image 65). There is no bowel obstruction. PERITONEUM AND RETROPERITONEUM: No ascites. No free air. VASCULATURE: Aorta is normal in caliber. LYMPH NODES: No lymphadenopathy. REPRODUCTIVE ORGANS: Prostatomegaly, with enlargement of the central gland indenting the base of the bladder, suggesting BPH. BONES AND SOFT TISSUES: Right hip arthroplasty. Mild degenerative changes at L5-S1. Mild diffuse osseous sclerosis, suggesting renal osteodystrophy. No focal soft tissue abnormality. IMPRESSION: 1. Underdistended bladder with suspected layering hemorrhage. Consider cystoscopy for further evaluation, as clinically warranted. 2. Bilateral nonobstructing renal calculi measuring up to 3 mm. 3. Numerous bilateral renal cysts compatible with polycystic kidney disease. 4. Prostatomegaly, suggesting BPH. Electronically signed by: Pinkie Pebbles MD 08/08/2024 01:22 AM EST RP Workstation: [id number]     Radiology: CT Renal Stone Study Result Date: 08/08/2024 EXAM: CT ABDOMEN AND PELVIS WITHOUT CONTRAST 08/08/2024 01:06:40 AM TECHNIQUE: CT of the abdomen and pelvis was performed without the administration of intravenous contrast.  Multiplanar reformatted images are provided for review. Automated exposure  control, iterative reconstruction, and/or weight-based adjustment of the mA/kV was utilized to reduce the radiation dose to as low as reasonably achievable. COMPARISON: 02/08/2024 CLINICAL HISTORY: Abdominal/flank pain, stone suspected. FINDINGS: LOWER CHEST: Patchy lingular and right lower lobe opacities, chronic, favoring scarring. LIVER: The liver is unremarkable. GALLBLADDER AND BILE DUCTS: Gallbladder is unremarkable. No biliary ductal dilatation. SPLEEN: No acute abnormality. PANCREAS: No acute abnormality. ADRENAL GLANDS: No acute abnormality. KIDNEYS, URETERS AND BLADDER: Numerous bilateral renal cysts of varying sizes and complexity, poorly evaluated on unenhanced CT, but compatible with polycystic kidney disease. Bilateral nonobstructing renal calculi measuring up to 3 mm. No hydronephrosis. No perinephric or periureteral stranding. Urinary bladder is underdistended but layering bladder hemorrhage is suspected. GI AND BOWEL: Postsurgical changes related to gastric sleeve. Normal appendix (image 65). There is no bowel obstruction. PERITONEUM AND RETROPERITONEUM: No ascites. No free air. VASCULATURE: Aorta is normal in caliber. LYMPH NODES: No lymphadenopathy. REPRODUCTIVE ORGANS: Prostatomegaly, with enlargement of the central gland indenting the base of the bladder, suggesting BPH. BONES AND SOFT TISSUES: Right hip arthroplasty. Mild degenerative changes at L5-S1. Mild diffuse osseous sclerosis, suggesting renal osteodystrophy. No focal soft tissue abnormality. IMPRESSION: 1. Underdistended bladder with suspected layering hemorrhage. Consider cystoscopy for further evaluation, as clinically warranted. 2. Bilateral nonobstructing renal calculi measuring up to 3 mm. 3. Numerous bilateral renal cysts compatible with polycystic kidney disease. 4. Prostatomegaly, suggesting BPH. Electronically signed by: Pinkie Pebbles MD 08/08/2024  01:22 AM EST RP Workstation: [id number]     Procedures   Medications Ordered in the ED  lidocaine  (LIDODERM ) 5 % 2 patch (2 patches Transdermal Patch Applied 08/08/24 0237)  tamsulosin  (FLOMAX ) capsule 0.4 mg (0.4 mg Oral Given 08/08/24 0236)  oxyCODONE -acetaminophen  (PERCOCET/ROXICET) 5-325 MG per tablet 1 tablet (1 tablet Oral Given 08/08/24 0236)                                    Medical Decision Making Patient with ESRD on Eliquis  presents with penile bleeding and pressure   Amount and/or Complexity of Data Reviewed Independent Historian: spouse    Details: And daughter see above  External Data Reviewed: notes.    Details: Previous notes reviewed  Labs: ordered.    Details: Normal white count 8.2, hemoglobin at baseline 10, normal platelets. Normal sodium 138, potassium slight elevation 5.2 elevated creatinine 5.29  Radiology: ordered and independent interpretation performed.    Details: Blood in bladder on CT Discussion of management or test interpretation with external provider(s): 215 AM case d/w Dr. Francisca.  No need for foley catheter at this time as patient does not make urine.  Hold Elqiuis for 1 to 2 weeks if possible. Please send secure chat so close follow up can be arranged.  (Chat sent)  Risk Prescription drug management. Risk Details: Very well appearing.  Treated with pain medication, has this at home but is not taking it.  Flomax  initiated.  Patient will need to follow up with [facility] for definitive care.         Final diagnoses:  Polycystic kidney disease  Hemorrhage into bladder lumen   No signs of systemic illness or infection. The patient is nontoxic-appearing on exam and vital signs are within normal limits.  I have reviewed the triage vital signs and the nursing notes. Pertinent labs & imaging results that were available during my care of the patient were reviewed by me and considered in my medical decision  making (see chart for details).  After history, exam, and medical workup I feel the patient has been appropriately medically screened and is safe for discharge home. Pertinent diagnoses were discussed with the patient. Patient was given return precautions.    ED Discharge Orders          Ordered    tamsulosin  (FLOMAX ) 0.4 MG CAPS capsule  Daily        08/08/24 0320               Addyson Traub, MD 08/08/24 9647

## 2024-09-22 ENCOUNTER — Ambulatory Visit (HOSPITAL_COMMUNITY)

## 2024-09-22 ENCOUNTER — Ambulatory Visit

## 2024-10-08 ENCOUNTER — Ambulatory Visit (HOSPITAL_COMMUNITY)

## 2024-10-13 ENCOUNTER — Ambulatory Visit: Admitting: Podiatry

## 2024-10-15 ENCOUNTER — Ambulatory Visit
